# Patient Record
Sex: Female | Born: 1955
Health system: Southern US, Community
[De-identification: ages and names within clinical notes are randomized; demographics above are authoritative.]

## PROBLEM LIST (undated history)

## (undated) DIAGNOSIS — F419 Anxiety disorder, unspecified: Secondary | ICD-10-CM

## (undated) DIAGNOSIS — E785 Hyperlipidemia, unspecified: Secondary | ICD-10-CM

## (undated) DIAGNOSIS — K219 Gastro-esophageal reflux disease without esophagitis: Secondary | ICD-10-CM

## (undated) DIAGNOSIS — F329 Major depressive disorder, single episode, unspecified: Secondary | ICD-10-CM

## (undated) DIAGNOSIS — J45909 Unspecified asthma, uncomplicated: Secondary | ICD-10-CM

## (undated) DIAGNOSIS — K589 Irritable bowel syndrome without diarrhea: Secondary | ICD-10-CM

## (undated) DIAGNOSIS — C801 Malignant (primary) neoplasm, unspecified: Secondary | ICD-10-CM

## (undated) DIAGNOSIS — F32A Depression, unspecified: Secondary | ICD-10-CM

## (undated) DIAGNOSIS — N393 Stress incontinence (female) (male): Secondary | ICD-10-CM

## (undated) DIAGNOSIS — D692 Other nonthrombocytopenic purpura: Secondary | ICD-10-CM

## (undated) DIAGNOSIS — Z780 Asymptomatic menopausal state: Secondary | ICD-10-CM

## (undated) DIAGNOSIS — M797 Fibromyalgia: Secondary | ICD-10-CM

## (undated) DIAGNOSIS — M069 Rheumatoid arthritis, unspecified: Secondary | ICD-10-CM

## (undated) DIAGNOSIS — E118 Type 2 diabetes mellitus with unspecified complications: Secondary | ICD-10-CM

## (undated) DIAGNOSIS — I10 Essential (primary) hypertension: Secondary | ICD-10-CM

## (undated) DIAGNOSIS — R011 Cardiac murmur, unspecified: Secondary | ICD-10-CM

## (undated) DIAGNOSIS — R51 Headache: Secondary | ICD-10-CM

## (undated) DIAGNOSIS — M1990 Unspecified osteoarthritis, unspecified site: Secondary | ICD-10-CM

## (undated) DIAGNOSIS — G709 Myoneural disorder, unspecified: Secondary | ICD-10-CM

## (undated) DIAGNOSIS — E119 Type 2 diabetes mellitus without complications: Secondary | ICD-10-CM

## (undated) DIAGNOSIS — S82209A Unspecified fracture of shaft of unspecified tibia, initial encounter for closed fracture: Secondary | ICD-10-CM

## (undated) HISTORY — DX: Fibromyalgia: M79.7

## (undated) HISTORY — DX: Irritable bowel syndrome, unspecified: K58.9

## (undated) HISTORY — DX: Myoneural disorder, unspecified: G70.9

## (undated) HISTORY — DX: Unspecified osteoarthritis, unspecified site: M19.90

## (undated) HISTORY — DX: Gastro-esophageal reflux disease without esophagitis: K21.9

## (undated) HISTORY — DX: Headache: R51

## (undated) HISTORY — DX: Depression, unspecified: F32.A

## (undated) HISTORY — DX: Asymptomatic menopausal state: Z78.0

## (undated) HISTORY — DX: Other nonthrombocytopenic purpura: D69.2

## (undated) HISTORY — DX: Rheumatoid arthritis, unspecified: M06.9

## (undated) HISTORY — PX: NASAL SINUS SURGERY: SHX719

## (undated) HISTORY — DX: Anxiety disorder, unspecified: F41.9

## (undated) HISTORY — DX: Type 2 diabetes mellitus with unspecified complications: E11.8

## (undated) HISTORY — PX: CERVICAL LAMINECTOMY: SHX94

## (undated) HISTORY — DX: Major depressive disorder, single episode, unspecified: F32.9

---
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
## 1979-07-13 HISTORY — PX: OTHER SURGICAL HISTORY: SHX169

## 1979-12-11 HISTORY — PX: OTHER SURGICAL HISTORY: SHX169

## 1984-07-12 HISTORY — PX: CHOLECYSTECTOMY: SHX55

## 1987-10-11 HISTORY — PX: DILATION AND CURETTAGE OF UTERUS: SHX78

## 1988-12-10 HISTORY — PX: CERVICAL CONIZATION W/BX: SHX1330

## 1991-07-13 HISTORY — PX: TUBAL LIGATION: SHX77

## 1997-07-12 HISTORY — PX: HYSTEROSCOPY: SHX211

## 1997-10-04 ENCOUNTER — Ambulatory Visit (HOSPITAL_COMMUNITY): Admission: RE | Admit: 1997-10-04 | Discharge: 1997-10-04 | Payer: Self-pay | Admitting: Obstetrics and Gynecology

## 1997-11-06 ENCOUNTER — Ambulatory Visit (HOSPITAL_COMMUNITY): Admission: RE | Admit: 1997-11-06 | Discharge: 1997-11-06 | Payer: Self-pay | Admitting: Pulmonary Disease

## 1998-02-13 ENCOUNTER — Ambulatory Visit: Admission: RE | Admit: 1998-02-13 | Discharge: 1998-02-13 | Payer: Self-pay | Admitting: Obstetrics and Gynecology

## 1998-05-12 ENCOUNTER — Encounter: Payer: Self-pay | Admitting: Neurosurgery

## 1998-05-12 ENCOUNTER — Ambulatory Visit (HOSPITAL_COMMUNITY): Admission: RE | Admit: 1998-05-12 | Discharge: 1998-05-12 | Payer: Self-pay | Admitting: Neurosurgery

## 1998-12-02 DIAGNOSIS — F419 Anxiety disorder, unspecified: Secondary | ICD-10-CM | POA: Insufficient documentation

## 1999-04-02 ENCOUNTER — Ambulatory Visit (HOSPITAL_COMMUNITY): Admission: RE | Admit: 1999-04-02 | Discharge: 1999-04-02 | Payer: Self-pay | Admitting: Internal Medicine

## 1999-04-02 ENCOUNTER — Encounter: Payer: Self-pay | Admitting: Internal Medicine

## 1999-05-12 ENCOUNTER — Other Ambulatory Visit: Admission: RE | Admit: 1999-05-12 | Discharge: 1999-05-12 | Payer: Self-pay | Admitting: Obstetrics and Gynecology

## 1999-08-14 ENCOUNTER — Encounter: Payer: Self-pay | Admitting: Neurosurgery

## 1999-08-14 ENCOUNTER — Ambulatory Visit (HOSPITAL_COMMUNITY): Admission: RE | Admit: 1999-08-14 | Discharge: 1999-08-14 | Payer: Self-pay | Admitting: Neurosurgery

## 1999-09-18 ENCOUNTER — Ambulatory Visit (HOSPITAL_COMMUNITY): Admission: RE | Admit: 1999-09-18 | Discharge: 1999-09-18 | Payer: Self-pay | Admitting: Neurosurgery

## 1999-09-18 ENCOUNTER — Encounter: Payer: Self-pay | Admitting: Neurosurgery

## 1999-12-11 HISTORY — PX: ABDOMINAL HYSTERECTOMY: SHX81

## 1999-12-21 ENCOUNTER — Encounter (INDEPENDENT_AMBULATORY_CARE_PROVIDER_SITE_OTHER): Payer: Self-pay

## 1999-12-22 ENCOUNTER — Inpatient Hospital Stay (HOSPITAL_COMMUNITY): Admission: AD | Admit: 1999-12-22 | Discharge: 1999-12-23 | Payer: Self-pay | Admitting: Obstetrics and Gynecology

## 2000-02-24 ENCOUNTER — Ambulatory Visit (HOSPITAL_COMMUNITY): Admission: RE | Admit: 2000-02-24 | Discharge: 2000-02-24 | Payer: Self-pay | Admitting: Obstetrics and Gynecology

## 2000-02-24 ENCOUNTER — Encounter: Payer: Self-pay | Admitting: Obstetrics and Gynecology

## 2000-04-20 ENCOUNTER — Other Ambulatory Visit: Admission: RE | Admit: 2000-04-20 | Discharge: 2000-04-20 | Payer: Self-pay | Admitting: Obstetrics and Gynecology

## 2000-04-21 ENCOUNTER — Encounter (INDEPENDENT_AMBULATORY_CARE_PROVIDER_SITE_OTHER): Payer: Self-pay

## 2000-04-21 ENCOUNTER — Other Ambulatory Visit: Admission: RE | Admit: 2000-04-21 | Discharge: 2000-04-21 | Payer: Self-pay | Admitting: Obstetrics and Gynecology

## 2000-05-19 ENCOUNTER — Encounter: Payer: Self-pay | Admitting: Internal Medicine

## 2000-05-19 ENCOUNTER — Ambulatory Visit (HOSPITAL_COMMUNITY): Admission: RE | Admit: 2000-05-19 | Discharge: 2000-05-19 | Payer: Self-pay | Admitting: Internal Medicine

## 2000-11-07 ENCOUNTER — Encounter: Admission: RE | Admit: 2000-11-07 | Discharge: 2000-11-07 | Payer: Self-pay | Admitting: Neurosurgery

## 2000-11-07 ENCOUNTER — Encounter: Payer: Self-pay | Admitting: Neurosurgery

## 2001-01-03 ENCOUNTER — Ambulatory Visit (HOSPITAL_COMMUNITY): Admission: RE | Admit: 2001-01-03 | Discharge: 2001-01-03 | Payer: Self-pay | Admitting: Infectious Diseases

## 2001-01-03 ENCOUNTER — Encounter: Payer: Self-pay | Admitting: Infectious Diseases

## 2001-03-01 ENCOUNTER — Ambulatory Visit (HOSPITAL_COMMUNITY): Admission: RE | Admit: 2001-03-01 | Discharge: 2001-03-01 | Payer: Self-pay | Admitting: Obstetrics and Gynecology

## 2001-03-01 ENCOUNTER — Encounter: Payer: Self-pay | Admitting: Obstetrics and Gynecology

## 2001-05-17 ENCOUNTER — Other Ambulatory Visit: Admission: RE | Admit: 2001-05-17 | Discharge: 2001-05-17 | Payer: Self-pay | Admitting: Obstetrics and Gynecology

## 2001-06-23 ENCOUNTER — Ambulatory Visit (HOSPITAL_COMMUNITY): Admission: RE | Admit: 2001-06-23 | Discharge: 2001-06-23 | Payer: Self-pay

## 2001-06-23 ENCOUNTER — Encounter: Payer: Self-pay | Admitting: Family Medicine

## 2001-10-30 ENCOUNTER — Encounter: Payer: Self-pay | Admitting: Obstetrics and Gynecology

## 2001-10-30 ENCOUNTER — Encounter: Admission: RE | Admit: 2001-10-30 | Discharge: 2001-10-30 | Payer: Self-pay | Admitting: Obstetrics and Gynecology

## 2001-11-29 ENCOUNTER — Encounter: Admission: RE | Admit: 2001-11-29 | Discharge: 2001-11-29 | Payer: Self-pay | Admitting: Family Medicine

## 2001-11-29 ENCOUNTER — Encounter: Payer: Self-pay | Admitting: Emergency Medicine

## 2001-11-29 ENCOUNTER — Encounter: Payer: Self-pay | Admitting: Family Medicine

## 2001-11-29 ENCOUNTER — Emergency Department (HOSPITAL_COMMUNITY): Admission: EM | Admit: 2001-11-29 | Discharge: 2001-11-29 | Payer: Self-pay | Admitting: Emergency Medicine

## 2001-12-01 ENCOUNTER — Ambulatory Visit (HOSPITAL_COMMUNITY): Admission: RE | Admit: 2001-12-01 | Discharge: 2001-12-01 | Payer: Self-pay | Admitting: Family Medicine

## 2001-12-01 ENCOUNTER — Encounter: Payer: Self-pay | Admitting: Family Medicine

## 2002-01-25 ENCOUNTER — Encounter: Payer: Self-pay | Admitting: Family Medicine

## 2002-01-25 ENCOUNTER — Ambulatory Visit (HOSPITAL_COMMUNITY): Admission: RE | Admit: 2002-01-25 | Discharge: 2002-01-25 | Payer: Self-pay | Admitting: Family Medicine

## 2002-03-27 ENCOUNTER — Encounter: Payer: Self-pay | Admitting: Obstetrics and Gynecology

## 2002-03-27 ENCOUNTER — Ambulatory Visit (HOSPITAL_COMMUNITY): Admission: RE | Admit: 2002-03-27 | Discharge: 2002-03-27 | Payer: Self-pay | Admitting: Obstetrics and Gynecology

## 2002-08-08 ENCOUNTER — Encounter: Payer: Self-pay | Admitting: Neurosurgery

## 2002-08-08 ENCOUNTER — Encounter: Admission: RE | Admit: 2002-08-08 | Discharge: 2002-08-08 | Payer: Self-pay | Admitting: Neurosurgery

## 2002-09-19 ENCOUNTER — Ambulatory Visit (HOSPITAL_COMMUNITY): Admission: RE | Admit: 2002-09-19 | Discharge: 2002-09-19 | Payer: Self-pay | Admitting: Family Medicine

## 2002-09-19 ENCOUNTER — Encounter: Payer: Self-pay | Admitting: Family Medicine

## 2003-02-20 ENCOUNTER — Encounter: Admission: RE | Admit: 2003-02-20 | Discharge: 2003-03-20 | Payer: Self-pay | Admitting: Orthopedic Surgery

## 2003-04-18 ENCOUNTER — Ambulatory Visit (HOSPITAL_COMMUNITY): Admission: RE | Admit: 2003-04-18 | Discharge: 2003-04-18 | Payer: Self-pay | Admitting: Obstetrics and Gynecology

## 2003-04-18 ENCOUNTER — Encounter: Payer: Self-pay | Admitting: Obstetrics and Gynecology

## 2003-06-24 ENCOUNTER — Other Ambulatory Visit: Admission: RE | Admit: 2003-06-24 | Discharge: 2003-06-24 | Payer: Self-pay | Admitting: Family Medicine

## 2003-07-13 HISTORY — PX: FOOT SURGERY: SHX648

## 2003-10-22 ENCOUNTER — Ambulatory Visit (HOSPITAL_BASED_OUTPATIENT_CLINIC_OR_DEPARTMENT_OTHER): Admission: RE | Admit: 2003-10-22 | Discharge: 2003-10-22 | Payer: Self-pay | Admitting: Orthopedic Surgery

## 2003-10-22 ENCOUNTER — Ambulatory Visit (HOSPITAL_COMMUNITY): Admission: RE | Admit: 2003-10-22 | Discharge: 2003-10-22 | Payer: Self-pay | Admitting: Orthopedic Surgery

## 2004-01-31 ENCOUNTER — Ambulatory Visit (HOSPITAL_COMMUNITY): Admission: RE | Admit: 2004-01-31 | Discharge: 2004-01-31 | Payer: Self-pay | Admitting: Family Medicine

## 2004-03-27 ENCOUNTER — Encounter: Admission: RE | Admit: 2004-03-27 | Discharge: 2004-03-27 | Payer: Self-pay | Admitting: Orthopaedic Surgery

## 2004-04-21 ENCOUNTER — Ambulatory Visit (HOSPITAL_COMMUNITY): Admission: RE | Admit: 2004-04-21 | Discharge: 2004-04-21 | Payer: Self-pay | Admitting: Obstetrics and Gynecology

## 2004-04-23 ENCOUNTER — Encounter: Admission: RE | Admit: 2004-04-23 | Discharge: 2004-04-23 | Payer: Self-pay | Admitting: Orthopaedic Surgery

## 2004-06-02 ENCOUNTER — Ambulatory Visit (HOSPITAL_COMMUNITY): Admission: RE | Admit: 2004-06-02 | Discharge: 2004-06-02 | Payer: Self-pay | Admitting: Orthopaedic Surgery

## 2004-06-17 ENCOUNTER — Ambulatory Visit: Payer: Self-pay | Admitting: Family Medicine

## 2004-07-01 ENCOUNTER — Inpatient Hospital Stay (HOSPITAL_COMMUNITY): Admission: RE | Admit: 2004-07-01 | Discharge: 2004-07-02 | Payer: Self-pay | Admitting: Orthopaedic Surgery

## 2004-09-13 ENCOUNTER — Emergency Department (HOSPITAL_COMMUNITY): Admission: EM | Admit: 2004-09-13 | Discharge: 2004-09-13 | Payer: Self-pay | Admitting: *Deleted

## 2004-09-15 ENCOUNTER — Ambulatory Visit: Payer: Self-pay | Admitting: Pulmonary Disease

## 2004-09-22 ENCOUNTER — Ambulatory Visit: Payer: Self-pay | Admitting: Pulmonary Disease

## 2004-10-22 ENCOUNTER — Ambulatory Visit: Payer: Self-pay | Admitting: Family Medicine

## 2004-11-20 ENCOUNTER — Ambulatory Visit: Payer: Self-pay | Admitting: Internal Medicine

## 2004-12-08 ENCOUNTER — Ambulatory Visit: Payer: Self-pay | Admitting: Family Medicine

## 2005-01-18 ENCOUNTER — Ambulatory Visit: Payer: Self-pay | Admitting: Internal Medicine

## 2005-02-10 ENCOUNTER — Ambulatory Visit: Payer: Self-pay | Admitting: Internal Medicine

## 2005-02-12 ENCOUNTER — Ambulatory Visit: Payer: Self-pay | Admitting: Internal Medicine

## 2005-05-04 ENCOUNTER — Ambulatory Visit (HOSPITAL_COMMUNITY): Admission: RE | Admit: 2005-05-04 | Discharge: 2005-05-04 | Payer: Self-pay | Admitting: Obstetrics and Gynecology

## 2005-05-05 ENCOUNTER — Other Ambulatory Visit: Admission: RE | Admit: 2005-05-05 | Discharge: 2005-05-05 | Payer: Self-pay | Admitting: Obstetrics and Gynecology

## 2005-05-10 ENCOUNTER — Ambulatory Visit: Payer: Self-pay | Admitting: Pulmonary Disease

## 2005-06-10 ENCOUNTER — Ambulatory Visit: Payer: Self-pay | Admitting: Family Medicine

## 2005-06-14 ENCOUNTER — Ambulatory Visit: Payer: Self-pay | Admitting: Cardiology

## 2005-06-14 ENCOUNTER — Ambulatory Visit: Payer: Self-pay | Admitting: Cardiovascular Disease

## 2005-06-24 ENCOUNTER — Ambulatory Visit: Payer: Self-pay

## 2005-09-11 ENCOUNTER — Ambulatory Visit: Payer: Self-pay | Admitting: Family Medicine

## 2005-09-16 ENCOUNTER — Ambulatory Visit: Payer: Self-pay | Admitting: Family Medicine

## 2005-10-18 ENCOUNTER — Ambulatory Visit: Payer: Self-pay | Admitting: Family Medicine

## 2005-10-29 ENCOUNTER — Ambulatory Visit: Payer: Self-pay | Admitting: Family Medicine

## 2005-11-05 ENCOUNTER — Ambulatory Visit: Payer: Self-pay | Admitting: Family Medicine

## 2005-11-15 ENCOUNTER — Encounter: Admission: RE | Admit: 2005-11-15 | Discharge: 2005-12-28 | Payer: Self-pay | Admitting: Family Medicine

## 2006-02-08 ENCOUNTER — Ambulatory Visit: Payer: Self-pay | Admitting: Family Medicine

## 2006-02-15 ENCOUNTER — Ambulatory Visit: Payer: Self-pay | Admitting: Internal Medicine

## 2006-02-16 ENCOUNTER — Ambulatory Visit: Payer: Self-pay

## 2006-05-05 ENCOUNTER — Ambulatory Visit (HOSPITAL_COMMUNITY): Admission: RE | Admit: 2006-05-05 | Discharge: 2006-05-05 | Payer: Self-pay | Admitting: Obstetrics and Gynecology

## 2006-05-09 ENCOUNTER — Other Ambulatory Visit: Admission: RE | Admit: 2006-05-09 | Discharge: 2006-05-09 | Payer: Self-pay | Admitting: Obstetrics and Gynecology

## 2006-05-11 ENCOUNTER — Ambulatory Visit: Payer: Self-pay | Admitting: Family Medicine

## 2006-05-11 LAB — CONVERTED CEMR LAB
ALT: 24 units/L (ref 0–40)
AST: 19 units/L (ref 0–37)
Albumin: 3.5 g/dL (ref 3.5–5.2)
Alkaline Phosphatase: 72 units/L (ref 39–117)
BUN: 7 mg/dL (ref 6–23)
CO2: 31 meq/L (ref 19–32)
Calcium: 9.5 mg/dL (ref 8.4–10.5)
Chloride: 102 meq/L (ref 96–112)
Chol/HDL Ratio, serum: 3.6
Cholesterol: 146 mg/dL (ref 0–200)
Creatinine, Ser: 0.8 mg/dL (ref 0.4–1.2)
GFR calc non Af Amer: 81 mL/min
Glomerular Filtration Rate, Af Am: 98 mL/min/{1.73_m2}
Glucose, Bld: 96 mg/dL (ref 70–99)
HDL: 40.9 mg/dL (ref >39.0)
Hgb A1c MFr Bld: 6.2 % — ABNORMAL HIGH (ref 4.6–6.0)
LDL Cholesterol: 84 mg/dL (ref 0–99)
Potassium: 4.2 meq/L (ref 3.5–5.1)
Sodium: 139 meq/L (ref 135–145)
Total Bilirubin: 0.6 mg/dL (ref 0.3–1.2)
Total Protein: 7.2 g/dL (ref 6.0–8.3)
Triglyceride fasting, serum: 107 mg/dL (ref 0–149)
VLDL: 21 mg/dL (ref 0–40)

## 2006-06-06 ENCOUNTER — Ambulatory Visit: Payer: Self-pay | Admitting: Family Medicine

## 2006-06-14 ENCOUNTER — Ambulatory Visit: Payer: Self-pay | Admitting: Family Medicine

## 2006-06-14 ENCOUNTER — Ambulatory Visit: Payer: Self-pay | Admitting: Cardiology

## 2006-07-26 ENCOUNTER — Ambulatory Visit: Payer: Self-pay | Admitting: Family Medicine

## 2006-07-26 LAB — CONVERTED CEMR LAB
ALT: 21 units/L (ref 0–40)
AST: 20 units/L (ref 0–37)
Albumin: 3.7 g/dL (ref 3.5–5.2)
Alkaline Phosphatase: 64 units/L (ref 39–117)
BUN: 8 mg/dL (ref 6–23)
Basophils Absolute: 0.1 10*3/uL (ref 0.0–0.1)
Basophils Relative: 0.7 % (ref 0.0–1.0)
CO2: 31 meq/L (ref 19–32)
Calcium: 9.5 mg/dL (ref 8.4–10.5)
Chloride: 98 meq/L (ref 96–112)
Cholesterol: 161 mg/dL (ref 0–200)
Creatinine, Ser: 0.8 mg/dL (ref 0.4–1.2)
Creatinine,U: 104.8 mg/dL
Eosinophils Relative: 2 % (ref 0.0–5.0)
GFR calc Af Amer: 98 mL/min
GFR calc non Af Amer: 81 mL/min
Glucose, Bld: 102 mg/dL — ABNORMAL HIGH (ref 70–99)
HCT: 40 % (ref 36.0–46.0)
HDL: 43.7 mg/dL (ref >39.0)
Hemoglobin: 13 g/dL (ref 12.0–15.0)
Hgb A1c MFr Bld: 6.2 % — ABNORMAL HIGH (ref 4.6–6.0)
LDL Cholesterol: 97 mg/dL (ref 0–99)
Lymphocytes Relative: 27.9 % (ref 12.0–46.0)
MCHC: 32.5 g/dL (ref 30.0–36.0)
MCV: 84.3 fL (ref 78.0–100.0)
Microalb Creat Ratio: 2.9 mg/g (ref 0.0–30.0)
Microalb, Ur: 0.3 mg/dL (ref 0.0–1.9)
Monocytes Absolute: 0.7 10*3/uL (ref 0.2–0.7)
Monocytes Relative: 7.2 % (ref 3.0–11.0)
Neutro Abs: 5.7 10*3/uL (ref 1.4–7.7)
Neutrophils Relative %: 62.2 % (ref 43.0–77.0)
Platelets: 311 10*3/uL (ref 150–400)
Potassium: 4 meq/L (ref 3.5–5.1)
RBC: 4.75 M/uL (ref 3.87–5.11)
RDW: 13.6 % (ref 11.5–14.6)
Sodium: 136 meq/L (ref 135–145)
TSH: 2.46 microintl units/mL (ref 0.35–5.50)
Total Bilirubin: 1 mg/dL (ref 0.3–1.2)
Total CHOL/HDL Ratio: 3.7
Total Protein: 7.4 g/dL (ref 6.0–8.3)
Triglycerides: 101 mg/dL (ref 0–149)
VLDL: 20 mg/dL (ref 0–40)
WBC: 9.3 10*3/uL (ref 4.5–10.5)

## 2006-08-11 ENCOUNTER — Ambulatory Visit: Payer: Self-pay | Admitting: Family Medicine

## 2006-08-30 ENCOUNTER — Ambulatory Visit: Payer: Self-pay | Admitting: Family Medicine

## 2006-08-30 ENCOUNTER — Encounter: Admission: RE | Admit: 2006-08-30 | Discharge: 2006-08-30 | Payer: Self-pay | Admitting: Family Medicine

## 2006-08-30 LAB — CONVERTED CEMR LAB
ALT: 22 units/L (ref 0–40)
AST: 19 units/L (ref 0–37)
Albumin: 3.6 g/dL (ref 3.5–5.2)
Alkaline Phosphatase: 73 units/L (ref 39–117)
Amylase: 41 units/L (ref 27–131)
BUN: 5 mg/dL — ABNORMAL LOW (ref 6–23)
Bilirubin, Direct: 0.1 mg/dL (ref 0.0–0.3)
CO2: 27 meq/L (ref 19–32)
Calcium: 9.5 mg/dL (ref 8.4–10.5)
Chloride: 102 meq/L (ref 96–112)
Creatinine, Ser: 0.9 mg/dL (ref 0.4–1.2)
GFR calc Af Amer: 86 mL/min
GFR calc non Af Amer: 71 mL/min
Glucose, Bld: 129 mg/dL — ABNORMAL HIGH (ref 70–99)
Lipase: 20 units/L (ref 11.0–59.0)
Potassium: 3.9 meq/L (ref 3.5–5.1)
Sodium: 138 meq/L (ref 135–145)
Total Bilirubin: 0.7 mg/dL (ref 0.3–1.2)
Total Protein: 7.3 g/dL (ref 6.0–8.3)

## 2006-08-31 ENCOUNTER — Ambulatory Visit: Payer: Self-pay | Admitting: Cardiovascular Disease

## 2006-08-31 ENCOUNTER — Ambulatory Visit: Payer: Self-pay | Admitting: Family Medicine

## 2006-08-31 LAB — CONVERTED CEMR LAB
Basophils Absolute: 0 10*3/uL (ref 0.0–0.1)
Basophils Relative: 0.5 % (ref 0.0–1.0)
Eosinophils Absolute: 0.1 10*3/uL (ref 0.0–0.6)
Eosinophils Relative: 1.3 % (ref 0.0–5.0)
HCT: 42.1 % (ref 36.0–46.0)
Hemoglobin: 14.2 g/dL (ref 12.0–15.0)
Lymphocytes Relative: 24.2 % (ref 12.0–46.0)
MCHC: 33.7 g/dL (ref 30.0–36.0)
MCV: 83.7 fL (ref 78.0–100.0)
Monocytes Absolute: 0.9 10*3/uL — ABNORMAL HIGH (ref 0.2–0.7)
Monocytes Relative: 11 % (ref 3.0–11.0)
Neutro Abs: 5.3 10*3/uL (ref 1.4–7.7)
Neutrophils Relative %: 63 % (ref 43.0–77.0)
Platelets: 329 10*3/uL (ref 150–400)
RBC: 5.03 M/uL (ref 3.87–5.11)
RDW: 13.4 % (ref 11.5–14.6)
WBC: 8.3 10*3/uL (ref 4.5–10.5)

## 2006-09-01 ENCOUNTER — Encounter: Payer: Self-pay | Admitting: Family Medicine

## 2006-09-07 ENCOUNTER — Encounter: Admission: RE | Admit: 2006-09-07 | Discharge: 2006-12-06 | Payer: Self-pay | Admitting: Family Medicine

## 2006-09-08 ENCOUNTER — Ambulatory Visit: Payer: Self-pay | Admitting: Family Medicine

## 2006-10-07 ENCOUNTER — Ambulatory Visit: Payer: Self-pay | Admitting: Family Medicine

## 2006-10-12 ENCOUNTER — Ambulatory Visit: Payer: Self-pay | Admitting: Family Medicine

## 2006-10-12 LAB — CONVERTED CEMR LAB
ALT: 38 units/L (ref 0–40)
AST: 25 units/L (ref 0–37)
Albumin: 3.1 g/dL — ABNORMAL LOW (ref 3.5–5.2)
Alkaline Phosphatase: 77 units/L (ref 39–117)
BUN: 5 mg/dL — ABNORMAL LOW (ref 6–23)
CO2: 31 meq/L (ref 19–32)
Calcium: 9.1 mg/dL (ref 8.4–10.5)
Chloride: 107 meq/L (ref 96–112)
Cholesterol: 146 mg/dL (ref 0–200)
Creatinine, Ser: 0.7 mg/dL (ref 0.4–1.2)
GFR calc Af Amer: 114 mL/min
GFR calc non Af Amer: 95 mL/min
Glucose, Bld: 118 mg/dL — ABNORMAL HIGH (ref 70–99)
HDL: 38 mg/dL — ABNORMAL LOW (ref >39.0)
Hgb A1c MFr Bld: 6.7 % — ABNORMAL HIGH (ref 4.6–6.0)
LDL Cholesterol: 90 mg/dL (ref 0–99)
Potassium: 4.4 meq/L (ref 3.5–5.1)
Sodium: 142 meq/L (ref 135–145)
Total Bilirubin: 0.6 mg/dL (ref 0.3–1.2)
Total CHOL/HDL Ratio: 3.8
Total Protein: 6.7 g/dL (ref 6.0–8.3)
Triglycerides: 90 mg/dL (ref 0–149)
VLDL: 18 mg/dL (ref 0–40)

## 2006-10-19 ENCOUNTER — Encounter: Payer: Self-pay | Admitting: Family Medicine

## 2006-11-07 ENCOUNTER — Ambulatory Visit: Payer: Self-pay | Admitting: Family Medicine

## 2006-11-29 ENCOUNTER — Ambulatory Visit: Payer: Self-pay | Admitting: Internal Medicine

## 2006-12-09 ENCOUNTER — Ambulatory Visit: Payer: Self-pay | Admitting: Internal Medicine

## 2006-12-21 ENCOUNTER — Ambulatory Visit: Payer: Self-pay | Admitting: Family Medicine

## 2006-12-28 ENCOUNTER — Encounter (INDEPENDENT_AMBULATORY_CARE_PROVIDER_SITE_OTHER): Payer: Self-pay | Admitting: *Deleted

## 2006-12-30 ENCOUNTER — Encounter: Payer: Self-pay | Admitting: Family Medicine

## 2006-12-30 LAB — CONVERTED CEMR LAB
ALT: 32 units/L (ref 0–40)
AST: 18 units/L (ref 0–37)
Albumin: 3.6 g/dL (ref 3.5–5.2)
Alkaline Phosphatase: 84 units/L (ref 39–117)
BUN: 7 mg/dL (ref 6–23)
Bilirubin, Direct: 0.1 mg/dL (ref 0.0–0.3)
CO2: 31 meq/L (ref 19–32)
Calcium: 9.7 mg/dL (ref 8.4–10.5)
Chloride: 98 meq/L (ref 96–112)
Creatinine, Ser: 0.7 mg/dL (ref 0.4–1.2)
GFR calc Af Amer: 114 mL/min
GFR calc non Af Amer: 95 mL/min
Glucose, Bld: 108 mg/dL — ABNORMAL HIGH (ref 70–99)
Hgb A1c MFr Bld: 7.2 % — ABNORMAL HIGH (ref 4.6–6.0)
Potassium: 4.1 meq/L (ref 3.5–5.1)
Sodium: 137 meq/L (ref 135–145)
Total Bilirubin: 0.9 mg/dL (ref 0.3–1.2)
Total Protein: 7.3 g/dL (ref 6.0–8.3)

## 2007-01-04 ENCOUNTER — Ambulatory Visit: Payer: Self-pay | Admitting: Family Medicine

## 2007-01-04 DIAGNOSIS — E118 Type 2 diabetes mellitus with unspecified complications: Secondary | ICD-10-CM

## 2007-01-04 DIAGNOSIS — R519 Headache, unspecified: Secondary | ICD-10-CM | POA: Insufficient documentation

## 2007-01-04 DIAGNOSIS — E785 Hyperlipidemia, unspecified: Secondary | ICD-10-CM | POA: Insufficient documentation

## 2007-01-04 DIAGNOSIS — I10 Essential (primary) hypertension: Secondary | ICD-10-CM | POA: Insufficient documentation

## 2007-01-04 DIAGNOSIS — E1140 Type 2 diabetes mellitus with diabetic neuropathy, unspecified: Secondary | ICD-10-CM | POA: Insufficient documentation

## 2007-01-04 DIAGNOSIS — R51 Headache: Secondary | ICD-10-CM | POA: Insufficient documentation

## 2007-01-04 DIAGNOSIS — J309 Allergic rhinitis, unspecified: Secondary | ICD-10-CM | POA: Insufficient documentation

## 2007-01-04 DIAGNOSIS — R0989 Other specified symptoms and signs involving the circulatory and respiratory systems: Secondary | ICD-10-CM | POA: Insufficient documentation

## 2007-01-05 ENCOUNTER — Ambulatory Visit: Payer: Self-pay | Admitting: Internal Medicine

## 2007-01-05 ENCOUNTER — Encounter: Payer: Self-pay | Admitting: Family Medicine

## 2007-01-05 LAB — HM COLONOSCOPY

## 2007-03-07 ENCOUNTER — Ambulatory Visit: Payer: Self-pay | Admitting: Endocrinology

## 2007-03-08 ENCOUNTER — Encounter: Payer: Self-pay | Admitting: Endocrinology

## 2007-04-03 ENCOUNTER — Ambulatory Visit: Payer: Self-pay | Admitting: Family Medicine

## 2007-04-07 ENCOUNTER — Encounter: Payer: Self-pay | Admitting: *Deleted

## 2007-04-07 LAB — CONVERTED CEMR LAB
BUN: 6 mg/dL (ref 6–23)
Basophils Absolute: 0.1 10*3/uL (ref 0.0–0.1)
Basophils Relative: 0.7 % (ref 0.0–1.0)
CO2: 31 meq/L (ref 19–32)
Calcium: 9.1 mg/dL (ref 8.4–10.5)
Chloride: 109 meq/L (ref 96–112)
Creatinine, Ser: 0.8 mg/dL (ref 0.4–1.2)
Eosinophils Absolute: 0.2 10*3/uL (ref 0.0–0.6)
Eosinophils Relative: 2.3 % (ref 0.0–5.0)
GFR calc Af Amer: 98 mL/min
GFR calc non Af Amer: 81 mL/min
Glucose, Bld: 98 mg/dL (ref 70–99)
HCT: 33.5 % — ABNORMAL LOW (ref 36.0–46.0)
Hemoglobin: 11.3 g/dL — ABNORMAL LOW (ref 12.0–15.0)
Hgb A1c MFr Bld: 6.7 % — ABNORMAL HIGH (ref 4.6–6.0)
Lymphocytes Relative: 25.9 % (ref 12.0–46.0)
MCHC: 33.9 g/dL (ref 30.0–36.0)
MCV: 83.7 fL (ref 78.0–100.0)
Monocytes Absolute: 0.6 10*3/uL (ref 0.2–0.7)
Monocytes Relative: 6.1 % (ref 3.0–11.0)
Neutro Abs: 6.9 10*3/uL (ref 1.4–7.7)
Neutrophils Relative %: 65 % (ref 43.0–77.0)
Platelets: 301 10*3/uL (ref 150–400)
Potassium: 4.2 meq/L (ref 3.5–5.1)
RBC: 4 M/uL (ref 3.87–5.11)
RDW: 13.6 % (ref 11.5–14.6)
Sodium: 145 meq/L (ref 135–145)
WBC: 10.5 10*3/uL (ref 4.5–10.5)

## 2007-04-21 ENCOUNTER — Encounter: Admission: RE | Admit: 2007-04-21 | Discharge: 2007-04-21 | Payer: Self-pay | Admitting: Orthopaedic Surgery

## 2007-05-08 ENCOUNTER — Ambulatory Visit (HOSPITAL_COMMUNITY): Admission: RE | Admit: 2007-05-08 | Discharge: 2007-05-08 | Payer: Self-pay | Admitting: Obstetrics and Gynecology

## 2007-05-23 ENCOUNTER — Telehealth: Payer: Self-pay | Admitting: Family Medicine

## 2007-05-23 ENCOUNTER — Encounter: Admission: RE | Admit: 2007-05-23 | Discharge: 2007-05-23 | Payer: Self-pay | Admitting: Orthopaedic Surgery

## 2007-05-24 ENCOUNTER — Ambulatory Visit: Payer: Self-pay | Admitting: Endocrinology

## 2007-05-24 LAB — CONVERTED CEMR LAB: Hgb A1c MFr Bld: 6.3 % — ABNORMAL HIGH (ref 4.6–6.0)

## 2007-05-29 ENCOUNTER — Ambulatory Visit: Payer: Self-pay | Admitting: Endocrinology

## 2007-06-02 ENCOUNTER — Encounter: Payer: Self-pay | Admitting: Endocrinology

## 2007-06-09 ENCOUNTER — Ambulatory Visit: Payer: Self-pay | Admitting: Family Medicine

## 2007-06-09 DIAGNOSIS — F418 Other specified anxiety disorders: Secondary | ICD-10-CM | POA: Insufficient documentation

## 2007-06-09 DIAGNOSIS — F411 Generalized anxiety disorder: Secondary | ICD-10-CM

## 2007-06-09 DIAGNOSIS — F329 Major depressive disorder, single episode, unspecified: Secondary | ICD-10-CM | POA: Insufficient documentation

## 2007-06-09 DIAGNOSIS — F3289 Other specified depressive episodes: Secondary | ICD-10-CM | POA: Insufficient documentation

## 2007-06-21 ENCOUNTER — Telehealth (INDEPENDENT_AMBULATORY_CARE_PROVIDER_SITE_OTHER): Payer: Self-pay | Admitting: *Deleted

## 2007-06-22 ENCOUNTER — Telehealth: Payer: Self-pay | Admitting: Family Medicine

## 2007-06-23 ENCOUNTER — Telehealth: Payer: Self-pay | Admitting: Internal Medicine

## 2007-07-13 DIAGNOSIS — G709 Myoneural disorder, unspecified: Secondary | ICD-10-CM

## 2007-07-13 HISTORY — DX: Myoneural disorder, unspecified: G70.9

## 2007-07-17 ENCOUNTER — Telehealth (INDEPENDENT_AMBULATORY_CARE_PROVIDER_SITE_OTHER): Payer: Self-pay | Admitting: *Deleted

## 2007-08-12 ENCOUNTER — Ambulatory Visit: Payer: Self-pay | Admitting: Family Medicine

## 2007-08-12 DIAGNOSIS — L0291 Cutaneous abscess, unspecified: Secondary | ICD-10-CM | POA: Insufficient documentation

## 2007-08-12 DIAGNOSIS — L0390 Cellulitis, unspecified: Secondary | ICD-10-CM

## 2007-08-14 ENCOUNTER — Ambulatory Visit: Payer: Self-pay | Admitting: Endocrinology

## 2007-08-14 LAB — CONVERTED CEMR LAB: Hgb A1c MFr Bld: 6.6 % — ABNORMAL HIGH (ref 4.6–6.0)

## 2007-08-17 ENCOUNTER — Ambulatory Visit: Payer: Self-pay | Admitting: Endocrinology

## 2007-09-01 ENCOUNTER — Ambulatory Visit: Payer: Self-pay | Admitting: Family Medicine

## 2007-09-01 DIAGNOSIS — J019 Acute sinusitis, unspecified: Secondary | ICD-10-CM | POA: Insufficient documentation

## 2007-09-20 ENCOUNTER — Telehealth (INDEPENDENT_AMBULATORY_CARE_PROVIDER_SITE_OTHER): Payer: Self-pay | Admitting: *Deleted

## 2007-09-20 ENCOUNTER — Telehealth: Payer: Self-pay | Admitting: Family Medicine

## 2007-09-22 ENCOUNTER — Encounter: Payer: Self-pay | Admitting: Family Medicine

## 2007-09-25 ENCOUNTER — Ambulatory Visit: Payer: Self-pay | Admitting: Family Medicine

## 2007-09-26 ENCOUNTER — Ambulatory Visit: Payer: Self-pay | Admitting: Family Medicine

## 2007-10-04 ENCOUNTER — Telehealth (INDEPENDENT_AMBULATORY_CARE_PROVIDER_SITE_OTHER): Payer: Self-pay | Admitting: *Deleted

## 2007-11-14 ENCOUNTER — Encounter: Payer: Self-pay | Admitting: Family Medicine

## 2007-11-30 ENCOUNTER — Ambulatory Visit: Payer: Self-pay | Admitting: Endocrinology

## 2007-11-30 LAB — CONVERTED CEMR LAB: Hgb A1c MFr Bld: 7 % — ABNORMAL HIGH (ref 4.6–6.0)

## 2007-12-07 ENCOUNTER — Telehealth (INDEPENDENT_AMBULATORY_CARE_PROVIDER_SITE_OTHER): Payer: Self-pay | Admitting: *Deleted

## 2007-12-11 ENCOUNTER — Telehealth (INDEPENDENT_AMBULATORY_CARE_PROVIDER_SITE_OTHER): Payer: Self-pay | Admitting: *Deleted

## 2008-01-17 ENCOUNTER — Telehealth: Payer: Self-pay | Admitting: Family Medicine

## 2008-01-17 ENCOUNTER — Ambulatory Visit: Payer: Self-pay | Admitting: Family Medicine

## 2008-01-18 ENCOUNTER — Encounter (INDEPENDENT_AMBULATORY_CARE_PROVIDER_SITE_OTHER): Payer: Self-pay | Admitting: *Deleted

## 2008-01-18 LAB — CONVERTED CEMR LAB: Hgb A1c MFr Bld: 7.2 % — ABNORMAL HIGH (ref 4.6–6.0)

## 2008-01-29 ENCOUNTER — Ambulatory Visit: Payer: Self-pay | Admitting: Family Medicine

## 2008-02-14 ENCOUNTER — Telehealth (INDEPENDENT_AMBULATORY_CARE_PROVIDER_SITE_OTHER): Payer: Self-pay | Admitting: *Deleted

## 2008-02-14 DIAGNOSIS — M545 Low back pain, unspecified: Secondary | ICD-10-CM | POA: Insufficient documentation

## 2008-02-15 ENCOUNTER — Ambulatory Visit: Payer: Self-pay | Admitting: Family Medicine

## 2008-02-15 ENCOUNTER — Ambulatory Visit (HOSPITAL_COMMUNITY): Admission: RE | Admit: 2008-02-15 | Discharge: 2008-02-15 | Payer: Self-pay | Admitting: Family Medicine

## 2008-02-16 ENCOUNTER — Telehealth (INDEPENDENT_AMBULATORY_CARE_PROVIDER_SITE_OTHER): Payer: Self-pay | Admitting: *Deleted

## 2008-02-26 ENCOUNTER — Telehealth (INDEPENDENT_AMBULATORY_CARE_PROVIDER_SITE_OTHER): Payer: Self-pay | Admitting: *Deleted

## 2008-03-01 ENCOUNTER — Encounter: Payer: Self-pay | Admitting: Family Medicine

## 2008-03-06 ENCOUNTER — Ambulatory Visit (HOSPITAL_COMMUNITY): Admission: RE | Admit: 2008-03-06 | Discharge: 2008-03-06 | Payer: Self-pay | Admitting: Neurosurgery

## 2008-03-07 ENCOUNTER — Encounter: Payer: Self-pay | Admitting: Family Medicine

## 2008-03-13 ENCOUNTER — Encounter: Payer: Self-pay | Admitting: Family Medicine

## 2008-03-19 ENCOUNTER — Ambulatory Visit (HOSPITAL_COMMUNITY): Admission: RE | Admit: 2008-03-19 | Discharge: 2008-03-20 | Payer: Self-pay | Admitting: Neurosurgery

## 2008-03-28 ENCOUNTER — Telehealth: Payer: Self-pay | Admitting: Family Medicine

## 2008-04-10 ENCOUNTER — Encounter: Payer: Self-pay | Admitting: Family Medicine

## 2008-04-24 ENCOUNTER — Encounter: Payer: Self-pay | Admitting: Family Medicine

## 2008-04-25 ENCOUNTER — Ambulatory Visit (HOSPITAL_COMMUNITY): Admission: RE | Admit: 2008-04-25 | Discharge: 2008-04-25 | Payer: Self-pay | Admitting: Neurosurgery

## 2008-04-29 ENCOUNTER — Encounter: Payer: Self-pay | Admitting: Family Medicine

## 2008-05-08 ENCOUNTER — Ambulatory Visit (HOSPITAL_COMMUNITY): Admission: RE | Admit: 2008-05-08 | Discharge: 2008-05-08 | Payer: Self-pay | Admitting: Obstetrics and Gynecology

## 2008-05-17 ENCOUNTER — Telehealth (INDEPENDENT_AMBULATORY_CARE_PROVIDER_SITE_OTHER): Payer: Self-pay | Admitting: *Deleted

## 2008-06-03 ENCOUNTER — Encounter: Payer: Self-pay | Admitting: Family Medicine

## 2008-06-13 ENCOUNTER — Ambulatory Visit: Payer: Self-pay | Admitting: Family Medicine

## 2008-06-13 DIAGNOSIS — M2550 Pain in unspecified joint: Secondary | ICD-10-CM | POA: Insufficient documentation

## 2008-06-13 DIAGNOSIS — M791 Myalgia, unspecified site: Secondary | ICD-10-CM | POA: Insufficient documentation

## 2008-06-13 DIAGNOSIS — IMO0001 Reserved for inherently not codable concepts without codable children: Secondary | ICD-10-CM | POA: Insufficient documentation

## 2008-06-14 ENCOUNTER — Telehealth (INDEPENDENT_AMBULATORY_CARE_PROVIDER_SITE_OTHER): Payer: Self-pay | Admitting: *Deleted

## 2008-06-14 ENCOUNTER — Encounter (INDEPENDENT_AMBULATORY_CARE_PROVIDER_SITE_OTHER): Payer: Self-pay | Admitting: *Deleted

## 2008-06-14 LAB — CONVERTED CEMR LAB
Anti Nuclear Antibody(ANA): NEGATIVE
Vit D, 1,25-Dihydroxy: 30 (ref 30–89)

## 2008-06-19 ENCOUNTER — Telehealth (INDEPENDENT_AMBULATORY_CARE_PROVIDER_SITE_OTHER): Payer: Self-pay | Admitting: *Deleted

## 2008-06-19 LAB — CONVERTED CEMR LAB
ALT: 21 units/L (ref 0–35)
AST: 20 units/L (ref 0–37)
Albumin: 3.7 g/dL (ref 3.5–5.2)
Alkaline Phosphatase: 68 units/L (ref 39–117)
BUN: 5 mg/dL — ABNORMAL LOW (ref 6–23)
Basophils Absolute: 0 10*3/uL (ref 0.0–0.1)
Basophils Relative: 0 % (ref 0.0–3.0)
Bilirubin, Direct: 0.1 mg/dL (ref 0.0–0.3)
CO2: 30 meq/L (ref 19–32)
Calcium: 9.4 mg/dL (ref 8.4–10.5)
Chloride: 103 meq/L (ref 96–112)
Cortisol, Plasma: 7.8 ug/dL
Creatinine, Ser: 0.8 mg/dL (ref 0.4–1.2)
Eosinophils Absolute: 0.2 10*3/uL (ref 0.0–0.7)
Eosinophils Relative: 2.2 % (ref 0.0–5.0)
GFR calc Af Amer: 97 mL/min
GFR calc non Af Amer: 80 mL/min
Glucose, Bld: 160 mg/dL — ABNORMAL HIGH (ref 70–99)
HCT: 35.5 % — ABNORMAL LOW (ref 36.0–46.0)
Hemoglobin: 11.9 g/dL — ABNORMAL LOW (ref 12.0–15.0)
Lymphocytes Relative: 27.6 % (ref 12.0–46.0)
MCHC: 33.5 g/dL (ref 30.0–36.0)
MCV: 82.7 fL (ref 78.0–100.0)
Monocytes Absolute: 0.1 10*3/uL (ref 0.1–1.0)
Monocytes Relative: 1.2 % — ABNORMAL LOW (ref 3.0–12.0)
Neutro Abs: 7.5 10*3/uL (ref 1.4–7.7)
Neutrophils Relative %: 69 % (ref 43.0–77.0)
Platelets: 311 10*3/uL (ref 150–400)
Potassium: 4.1 meq/L (ref 3.5–5.1)
RBC: 4.3 M/uL (ref 3.87–5.11)
RDW: 13.5 % (ref 11.5–14.6)
Rhuematoid fact SerPl-aCnc: <20 intl units/mL — ABNORMAL LOW (ref 0.0–20.0)
Sed Rate: 34 mm/hr — ABNORMAL HIGH (ref 0–22)
Sodium: 139 meq/L (ref 135–145)
TSH: 2.43 microintl units/mL (ref 0.35–5.50)
Total Bilirubin: 0.6 mg/dL (ref 0.3–1.2)
Total Protein: 7.4 g/dL (ref 6.0–8.3)
WBC: 10.8 10*3/uL — ABNORMAL HIGH (ref 4.5–10.5)

## 2008-06-27 ENCOUNTER — Ambulatory Visit: Payer: Self-pay | Admitting: Family Medicine

## 2008-06-28 ENCOUNTER — Telehealth (INDEPENDENT_AMBULATORY_CARE_PROVIDER_SITE_OTHER): Payer: Self-pay | Admitting: *Deleted

## 2008-06-28 LAB — CONVERTED CEMR LAB
Cholesterol: 93 mg/dL (ref 0–200)
HDL: 33.3 mg/dL — ABNORMAL LOW (ref >39.0)
Hgb A1c MFr Bld: 7.4 % — ABNORMAL HIGH (ref 4.6–6.0)
LDL Cholesterol: 47 mg/dL (ref 0–99)
Total CHOL/HDL Ratio: 2.8
Triglycerides: 62 mg/dL (ref 0–149)
VLDL: 12 mg/dL (ref 0–40)

## 2008-07-02 ENCOUNTER — Telehealth (INDEPENDENT_AMBULATORY_CARE_PROVIDER_SITE_OTHER): Payer: Self-pay | Admitting: *Deleted

## 2008-07-04 ENCOUNTER — Telehealth (INDEPENDENT_AMBULATORY_CARE_PROVIDER_SITE_OTHER): Payer: Self-pay | Admitting: *Deleted

## 2008-07-15 ENCOUNTER — Ambulatory Visit: Payer: Self-pay | Admitting: Family Medicine

## 2008-07-15 DIAGNOSIS — D6489 Other specified anemias: Secondary | ICD-10-CM | POA: Insufficient documentation

## 2008-07-15 DIAGNOSIS — E559 Vitamin D deficiency, unspecified: Secondary | ICD-10-CM | POA: Insufficient documentation

## 2008-07-17 LAB — CONVERTED CEMR LAB
Basophils Absolute: 0.1 10*3/uL (ref 0.0–0.1)
Basophils Relative: 0.8 % (ref 0.0–3.0)
Eosinophils Absolute: 0.3 10*3/uL (ref 0.0–0.7)
Eosinophils Relative: 1.9 % (ref 0.0–5.0)
HCT: 38.8 % (ref 36.0–46.0)
Hemoglobin: 13 g/dL (ref 12.0–15.0)
Iron: 43 ug/dL (ref 42–145)
Lymphocytes Relative: 25.2 % (ref 12.0–46.0)
MCHC: 33.5 g/dL (ref 30.0–36.0)
MCV: 82.4 fL (ref 78.0–100.0)
Monocytes Absolute: 1.1 10*3/uL — ABNORMAL HIGH (ref 0.1–1.0)
Monocytes Relative: 6.8 % (ref 3.0–12.0)
Neutro Abs: 10.5 10*3/uL — ABNORMAL HIGH (ref 1.4–7.7)
Neutrophils Relative %: 65.3 % (ref 43.0–77.0)
Platelets: 275 10*3/uL (ref 150–400)
RBC: 4.71 M/uL (ref 3.87–5.11)
RDW: 13.5 % (ref 11.5–14.6)
Saturation Ratios: 11.3 % — ABNORMAL LOW (ref 20.0–50.0)
Transferrin: 271.8 mg/dL (ref >=212.0)
Vit D, 1,25-Dihydroxy: 54 (ref 30–89)
WBC: 16.1 10*3/uL — ABNORMAL HIGH (ref 4.5–10.5)

## 2008-07-19 ENCOUNTER — Encounter (INDEPENDENT_AMBULATORY_CARE_PROVIDER_SITE_OTHER): Payer: Self-pay | Admitting: *Deleted

## 2008-08-06 ENCOUNTER — Telehealth (INDEPENDENT_AMBULATORY_CARE_PROVIDER_SITE_OTHER): Payer: Self-pay | Admitting: *Deleted

## 2008-08-23 ENCOUNTER — Telehealth (INDEPENDENT_AMBULATORY_CARE_PROVIDER_SITE_OTHER): Payer: Self-pay | Admitting: *Deleted

## 2008-09-25 ENCOUNTER — Telehealth (INDEPENDENT_AMBULATORY_CARE_PROVIDER_SITE_OTHER): Payer: Self-pay | Admitting: *Deleted

## 2008-10-08 ENCOUNTER — Encounter: Payer: Self-pay | Admitting: Family Medicine

## 2008-10-27 ENCOUNTER — Emergency Department (HOSPITAL_BASED_OUTPATIENT_CLINIC_OR_DEPARTMENT_OTHER): Admission: EM | Admit: 2008-10-27 | Discharge: 2008-10-27 | Payer: Self-pay | Admitting: Emergency Medicine

## 2008-11-04 ENCOUNTER — Ambulatory Visit: Payer: Self-pay | Admitting: Occupational Medicine

## 2008-11-13 ENCOUNTER — Telehealth (INDEPENDENT_AMBULATORY_CARE_PROVIDER_SITE_OTHER): Payer: Self-pay | Admitting: *Deleted

## 2008-11-13 ENCOUNTER — Ambulatory Visit: Payer: Self-pay | Admitting: Family Medicine

## 2008-11-14 ENCOUNTER — Encounter: Payer: Self-pay | Admitting: Family Medicine

## 2008-11-14 LAB — CONVERTED CEMR LAB
Basophils Absolute: 0 10*3/uL (ref 0.0–0.1)
Basophils Relative: 0.4 % (ref 0.0–3.0)
Eosinophils Absolute: 0.3 10*3/uL (ref 0.0–0.7)
Eosinophils Relative: 2.7 % (ref 0.0–5.0)
HCT: 36.8 % (ref 36.0–46.0)
Hemoglobin: 12.6 g/dL (ref 12.0–15.0)
Lymphocytes Relative: 24.5 % (ref 12.0–46.0)
Lymphs Abs: 2.9 10*3/uL (ref 0.7–4.0)
MCHC: 34.3 g/dL (ref 30.0–36.0)
MCV: 83.8 fL (ref 78.0–100.0)
Monocytes Absolute: 0.1 10*3/uL (ref 0.1–1.0)
Monocytes Relative: 1.2 % — ABNORMAL LOW (ref 3.0–12.0)
Neutro Abs: 8.5 10*3/uL — ABNORMAL HIGH (ref 1.4–7.7)
Neutrophils Relative %: 71.2 % (ref 43.0–77.0)
Platelets: 255 10*3/uL (ref 150.0–400.0)
RBC: 4.39 M/uL (ref 3.87–5.11)
RDW: 13.4 % (ref 11.5–14.6)
WBC: 11.8 10*3/uL — ABNORMAL HIGH (ref 4.5–10.5)

## 2008-11-15 ENCOUNTER — Encounter (INDEPENDENT_AMBULATORY_CARE_PROVIDER_SITE_OTHER): Payer: Self-pay | Admitting: *Deleted

## 2008-11-17 LAB — CONVERTED CEMR LAB
ALT: 29 units/L (ref 0–35)
AST: 25 units/L (ref 0–37)
Albumin: 4 g/dL (ref 3.5–5.2)
Alkaline Phosphatase: 73 units/L (ref 39–117)
BUN: 8 mg/dL (ref 6–23)
Bilirubin, Direct: 0.1 mg/dL (ref 0.0–0.3)
CO2: 25 meq/L (ref 19–32)
Calcium: 9.2 mg/dL (ref 8.4–10.5)
Chloride: 102 meq/L (ref 96–112)
Cholesterol: 97 mg/dL (ref 0–200)
Creatinine, Ser: 0.85 mg/dL (ref 0.40–1.20)
Glucose, Bld: 129 mg/dL — ABNORMAL HIGH (ref 70–99)
HDL: 40 mg/dL (ref >39)
Hgb A1c MFr Bld: 7.2 % — ABNORMAL HIGH (ref 4.6–6.1)
Indirect Bilirubin: 0.4 mg/dL (ref 0.0–0.9)
LDL Cholesterol: 41 mg/dL (ref 0–99)
Potassium: 4.8 meq/L (ref 3.5–5.3)
Sodium: 138 meq/L (ref 135–145)
Total Bilirubin: 0.5 mg/dL (ref 0.3–1.2)
Total CHOL/HDL Ratio: 2.4
Total Protein: 7.3 g/dL (ref 6.0–8.3)
Triglycerides: 80 mg/dL (ref <150)
VLDL: 16 mg/dL (ref 0–40)

## 2008-11-18 ENCOUNTER — Telehealth (INDEPENDENT_AMBULATORY_CARE_PROVIDER_SITE_OTHER): Payer: Self-pay | Admitting: *Deleted

## 2008-11-18 ENCOUNTER — Encounter (INDEPENDENT_AMBULATORY_CARE_PROVIDER_SITE_OTHER): Payer: Self-pay | Admitting: *Deleted

## 2008-11-18 ENCOUNTER — Ambulatory Visit: Payer: Self-pay | Admitting: Family Medicine

## 2008-11-18 DIAGNOSIS — R21 Rash and other nonspecific skin eruption: Secondary | ICD-10-CM | POA: Insufficient documentation

## 2008-11-22 ENCOUNTER — Telehealth (INDEPENDENT_AMBULATORY_CARE_PROVIDER_SITE_OTHER): Payer: Self-pay | Admitting: *Deleted

## 2008-11-27 LAB — HM DIABETES EYE EXAM

## 2008-12-23 ENCOUNTER — Telehealth (INDEPENDENT_AMBULATORY_CARE_PROVIDER_SITE_OTHER): Payer: Self-pay | Admitting: *Deleted

## 2009-01-02 ENCOUNTER — Ambulatory Visit: Payer: Self-pay | Admitting: Family Medicine

## 2009-01-03 ENCOUNTER — Encounter: Payer: Self-pay | Admitting: Family Medicine

## 2009-01-30 ENCOUNTER — Ambulatory Visit: Payer: Self-pay | Admitting: Family Medicine

## 2009-01-30 LAB — HM DIABETES FOOT EXAM

## 2009-02-03 ENCOUNTER — Encounter: Payer: Self-pay | Admitting: Family Medicine

## 2009-02-24 ENCOUNTER — Ambulatory Visit: Payer: Self-pay | Admitting: Endocrinology

## 2009-02-26 LAB — CONVERTED CEMR LAB: Hgb A1c MFr Bld: 7.5 % — ABNORMAL HIGH (ref 4.6–6.5)

## 2009-03-03 ENCOUNTER — Ambulatory Visit: Payer: Self-pay | Admitting: Family Medicine

## 2009-04-03 ENCOUNTER — Ambulatory Visit: Payer: Self-pay | Admitting: Family Medicine

## 2009-04-08 ENCOUNTER — Telehealth: Payer: Self-pay | Admitting: Family Medicine

## 2009-04-08 ENCOUNTER — Ambulatory Visit: Payer: Self-pay | Admitting: Family Medicine

## 2009-04-08 DIAGNOSIS — R197 Diarrhea, unspecified: Secondary | ICD-10-CM | POA: Insufficient documentation

## 2009-04-08 DIAGNOSIS — R110 Nausea: Secondary | ICD-10-CM | POA: Insufficient documentation

## 2009-04-09 ENCOUNTER — Encounter: Payer: Self-pay | Admitting: Family Medicine

## 2009-04-09 LAB — CONVERTED CEMR LAB
ALT: 32 units/L (ref 0–35)
AST: 25 units/L (ref 0–37)
Albumin: 3.9 g/dL (ref 3.5–5.2)
Alkaline Phosphatase: 75 units/L (ref 39–117)
BUN: 8 mg/dL (ref 6–23)
Basophils Absolute: 0 10*3/uL (ref 0.0–0.1)
Basophils Relative: 0 % (ref 0.0–3.0)
Bilirubin, Direct: 0 mg/dL (ref 0.0–0.3)
CO2: 32 meq/L (ref 19–32)
Calcium: 9.8 mg/dL (ref 8.4–10.5)
Chloride: 102 meq/L (ref 96–112)
Creatinine, Ser: 0.8 mg/dL (ref 0.4–1.2)
Eosinophils Absolute: 0.2 10*3/uL (ref 0.0–0.7)
Eosinophils Relative: 2.6 % (ref 0.0–5.0)
GFR calc non Af Amer: 79.97 mL/min (ref >60)
Glucose, Bld: 179 mg/dL — ABNORMAL HIGH (ref 70–99)
HCT: 38.4 % (ref 36.0–46.0)
Hemoglobin: 12.8 g/dL (ref 12.0–15.0)
Lymphocytes Relative: 27.1 % (ref 12.0–46.0)
Lymphs Abs: 2.4 10*3/uL (ref 0.7–4.0)
MCHC: 33.3 g/dL (ref 30.0–36.0)
MCV: 83.3 fL (ref 78.0–100.0)
Monocytes Absolute: 0.2 10*3/uL (ref 0.1–1.0)
Monocytes Relative: 1.9 % — ABNORMAL LOW (ref 3.0–12.0)
Neutro Abs: 6.1 10*3/uL (ref 1.4–7.7)
Neutrophils Relative %: 68.4 % (ref 43.0–77.0)
Platelets: 274 10*3/uL (ref 150.0–400.0)
Potassium: 4.4 meq/L (ref 3.5–5.1)
RBC: 4.61 M/uL (ref 3.87–5.11)
RDW: 13.7 % (ref 11.5–14.6)
Sodium: 139 meq/L (ref 135–145)
TSH: 1.91 microintl units/mL (ref 0.35–5.50)
Total Bilirubin: 0.6 mg/dL (ref 0.3–1.2)
Total Protein: 7.3 g/dL (ref 6.0–8.3)
WBC: 8.9 10*3/uL (ref 4.5–10.5)

## 2009-04-15 ENCOUNTER — Telehealth: Payer: Self-pay | Admitting: Pulmonary Disease

## 2009-04-16 ENCOUNTER — Ambulatory Visit: Payer: Self-pay | Admitting: Pulmonary Disease

## 2009-04-16 DIAGNOSIS — J209 Acute bronchitis, unspecified: Secondary | ICD-10-CM | POA: Insufficient documentation

## 2009-04-24 ENCOUNTER — Telehealth (INDEPENDENT_AMBULATORY_CARE_PROVIDER_SITE_OTHER): Payer: Self-pay | Admitting: *Deleted

## 2009-04-25 ENCOUNTER — Encounter: Payer: Self-pay | Admitting: Family Medicine

## 2009-04-27 ENCOUNTER — Ambulatory Visit: Payer: Self-pay | Admitting: Family Medicine

## 2009-04-27 DIAGNOSIS — R05 Cough: Secondary | ICD-10-CM

## 2009-04-27 DIAGNOSIS — R059 Cough, unspecified: Secondary | ICD-10-CM | POA: Insufficient documentation

## 2009-04-29 ENCOUNTER — Ambulatory Visit: Payer: Self-pay | Admitting: Family Medicine

## 2009-04-29 DIAGNOSIS — J029 Acute pharyngitis, unspecified: Secondary | ICD-10-CM | POA: Insufficient documentation

## 2009-04-30 ENCOUNTER — Telehealth: Payer: Self-pay | Admitting: Family Medicine

## 2009-04-30 ENCOUNTER — Encounter: Payer: Self-pay | Admitting: Family Medicine

## 2009-04-30 ENCOUNTER — Ambulatory Visit: Payer: Self-pay | Admitting: Internal Medicine

## 2009-05-05 ENCOUNTER — Encounter: Payer: Self-pay | Admitting: Family Medicine

## 2009-05-08 ENCOUNTER — Ambulatory Visit: Payer: Self-pay | Admitting: Family Medicine

## 2009-05-09 ENCOUNTER — Ambulatory Visit (HOSPITAL_COMMUNITY): Admission: RE | Admit: 2009-05-09 | Discharge: 2009-05-09 | Payer: Self-pay | Admitting: Obstetrics and Gynecology

## 2009-05-15 ENCOUNTER — Telehealth: Payer: Self-pay | Admitting: Family Medicine

## 2009-06-10 ENCOUNTER — Ambulatory Visit: Payer: Self-pay | Admitting: Family Medicine

## 2009-06-10 DIAGNOSIS — N39498 Other specified urinary incontinence: Secondary | ICD-10-CM | POA: Insufficient documentation

## 2009-06-30 ENCOUNTER — Ambulatory Visit: Payer: Self-pay | Admitting: Family Medicine

## 2009-06-30 DIAGNOSIS — B351 Tinea unguium: Secondary | ICD-10-CM | POA: Insufficient documentation

## 2009-07-01 ENCOUNTER — Encounter (INDEPENDENT_AMBULATORY_CARE_PROVIDER_SITE_OTHER): Payer: Self-pay | Admitting: *Deleted

## 2009-07-01 LAB — CONVERTED CEMR LAB: Vit D, 25-Hydroxy: 46 ng/mL (ref 30–89)

## 2009-07-02 ENCOUNTER — Telehealth (INDEPENDENT_AMBULATORY_CARE_PROVIDER_SITE_OTHER): Payer: Self-pay | Admitting: *Deleted

## 2009-07-02 LAB — CONVERTED CEMR LAB
ALT: 32 units/L (ref 0–35)
AST: 28 units/L (ref 0–37)
Albumin: 3.8 g/dL (ref 3.5–5.2)
Alkaline Phosphatase: 71 units/L (ref 39–117)
BUN: 6 mg/dL (ref 6–23)
Bilirubin, Direct: 0 mg/dL (ref 0.0–0.3)
CO2: 30 meq/L (ref 19–32)
Calcium: 9.3 mg/dL (ref 8.4–10.5)
Chloride: 101 meq/L (ref 96–112)
Cholesterol: 111 mg/dL (ref 0–200)
Creatinine, Ser: 0.8 mg/dL (ref 0.4–1.2)
Creatinine,U: 120.8 mg/dL
GFR calc non Af Amer: 79.91 mL/min (ref >60)
Glucose, Bld: 135 mg/dL — ABNORMAL HIGH (ref 70–99)
HDL: 39.7 mg/dL (ref >39.00)
Hgb A1c MFr Bld: 8.7 % — ABNORMAL HIGH (ref 4.6–6.5)
LDL Cholesterol: 52 mg/dL (ref 0–99)
Microalb Creat Ratio: 6.6 mg/g (ref 0.0–30.0)
Microalb, Ur: 0.8 mg/dL (ref 0.0–1.9)
Potassium: 4.4 meq/L (ref 3.5–5.1)
Sodium: 139 meq/L (ref 135–145)
Total Bilirubin: 0.9 mg/dL (ref 0.3–1.2)
Total CHOL/HDL Ratio: 3
Total Protein: 7.4 g/dL (ref 6.0–8.3)
Triglycerides: 97 mg/dL (ref 0.0–149.0)
VLDL: 19.4 mg/dL (ref 0.0–40.0)

## 2009-07-07 ENCOUNTER — Encounter: Payer: Self-pay | Admitting: Family Medicine

## 2009-07-16 ENCOUNTER — Encounter (INDEPENDENT_AMBULATORY_CARE_PROVIDER_SITE_OTHER): Payer: Self-pay | Admitting: *Deleted

## 2009-07-23 ENCOUNTER — Telehealth (INDEPENDENT_AMBULATORY_CARE_PROVIDER_SITE_OTHER): Payer: Self-pay | Admitting: *Deleted

## 2009-08-29 ENCOUNTER — Telehealth (INDEPENDENT_AMBULATORY_CARE_PROVIDER_SITE_OTHER): Payer: Self-pay | Admitting: *Deleted

## 2009-08-29 ENCOUNTER — Telehealth: Payer: Self-pay | Admitting: Endocrinology

## 2009-08-29 ENCOUNTER — Encounter: Payer: Self-pay | Admitting: Family Medicine

## 2009-09-01 ENCOUNTER — Telehealth (INDEPENDENT_AMBULATORY_CARE_PROVIDER_SITE_OTHER): Payer: Self-pay | Admitting: *Deleted

## 2009-09-03 ENCOUNTER — Telehealth: Payer: Self-pay | Admitting: Family Medicine

## 2009-09-18 ENCOUNTER — Encounter: Payer: Self-pay | Admitting: Family Medicine

## 2009-09-22 ENCOUNTER — Encounter: Payer: Self-pay | Admitting: Family Medicine

## 2009-09-30 ENCOUNTER — Ambulatory Visit (HOSPITAL_COMMUNITY): Admission: RE | Admit: 2009-09-30 | Discharge: 2009-09-30 | Payer: Self-pay | Admitting: Surgery

## 2009-10-02 ENCOUNTER — Ambulatory Visit (HOSPITAL_COMMUNITY): Admission: RE | Admit: 2009-10-02 | Discharge: 2009-10-02 | Payer: Self-pay | Admitting: Surgery

## 2009-10-13 ENCOUNTER — Ambulatory Visit: Payer: Self-pay | Admitting: Family Medicine

## 2009-10-16 ENCOUNTER — Telehealth: Payer: Self-pay | Admitting: Family Medicine

## 2009-10-22 ENCOUNTER — Ambulatory Visit (HOSPITAL_BASED_OUTPATIENT_CLINIC_OR_DEPARTMENT_OTHER): Admission: RE | Admit: 2009-10-22 | Discharge: 2009-10-22 | Payer: Self-pay | Admitting: Surgery

## 2009-10-23 ENCOUNTER — Encounter: Payer: Self-pay | Admitting: Family Medicine

## 2009-10-25 ENCOUNTER — Ambulatory Visit: Payer: Self-pay | Admitting: Internal Medicine

## 2009-11-13 ENCOUNTER — Encounter: Admission: RE | Admit: 2009-11-13 | Discharge: 2010-02-11 | Payer: Self-pay | Admitting: Surgery

## 2009-12-12 ENCOUNTER — Encounter: Payer: Self-pay | Admitting: Family Medicine

## 2009-12-18 ENCOUNTER — Encounter: Payer: Self-pay | Admitting: Family Medicine

## 2009-12-25 ENCOUNTER — Ambulatory Visit: Payer: Self-pay | Admitting: Family Medicine

## 2009-12-30 ENCOUNTER — Ambulatory Visit (HOSPITAL_COMMUNITY): Admission: RE | Admit: 2009-12-30 | Discharge: 2009-12-31 | Payer: Self-pay | Admitting: Surgery

## 2009-12-30 HISTORY — PX: LAPAROSCOPIC GASTRIC BANDING: SHX1100

## 2010-01-22 ENCOUNTER — Telehealth: Payer: Self-pay | Admitting: Family Medicine

## 2010-01-29 ENCOUNTER — Ambulatory Visit: Payer: Self-pay | Admitting: Family Medicine

## 2010-01-29 DIAGNOSIS — Z9884 Bariatric surgery status: Secondary | ICD-10-CM | POA: Insufficient documentation

## 2010-01-30 ENCOUNTER — Encounter: Payer: Self-pay | Admitting: Family Medicine

## 2010-02-11 LAB — CONVERTED CEMR LAB
ALT: 44 units/L — ABNORMAL HIGH (ref 0–35)
AST: 33 units/L (ref 0–37)
Albumin: 4.3 g/dL (ref 3.5–5.2)
Alkaline Phosphatase: 84 units/L (ref 39–117)
BUN: 12 mg/dL (ref 6–23)
Basophils Absolute: 0 10*3/uL (ref 0.0–0.1)
Basophils Relative: 0.5 % (ref 0.0–3.0)
Bilirubin, Direct: 0.2 mg/dL (ref 0.0–0.3)
CO2: 29 meq/L (ref 19–32)
Calcium: 9.8 mg/dL (ref 8.4–10.5)
Chloride: 102 meq/L (ref 96–112)
Cholesterol: 155 mg/dL (ref 0–200)
Creatinine, Ser: 1 mg/dL (ref 0.4–1.2)
Eosinophils Absolute: 0.2 10*3/uL (ref 0.0–0.7)
Eosinophils Relative: 1.9 % (ref 0.0–5.0)
Ferritin: 25.9 ng/mL (ref 10.0–291.0)
Folate: 8.2 ng/mL
GFR calc non Af Amer: 59.56 mL/min (ref >60)
Glucose, Bld: 108 mg/dL — ABNORMAL HIGH (ref 70–99)
HCT: 39.1 % (ref 36.0–46.0)
HDL: 38.5 mg/dL — ABNORMAL LOW (ref >39.00)
Hemoglobin: 13 g/dL (ref 12.0–15.0)
Hgb A1c MFr Bld: 6.6 % — ABNORMAL HIGH (ref 4.6–6.5)
Iron: 32 ug/dL — ABNORMAL LOW (ref 42–145)
LDL Cholesterol: 87 mg/dL (ref 0–99)
Lymphocytes Relative: 28.4 % (ref 12.0–46.0)
Lymphs Abs: 2.9 10*3/uL (ref 0.7–4.0)
MCHC: 33.3 g/dL (ref 30.0–36.0)
MCV: 83.8 fL (ref 78.0–100.0)
Monocytes Absolute: 0.7 10*3/uL (ref 0.1–1.0)
Monocytes Relative: 6.4 % (ref 3.0–12.0)
Neutro Abs: 6.4 10*3/uL (ref 1.4–7.7)
Neutrophils Relative %: 62.8 % (ref 43.0–77.0)
Platelets: 300 10*3/uL (ref 150.0–400.0)
Potassium: 4.4 meq/L (ref 3.5–5.1)
RBC: 4.66 M/uL (ref 3.87–5.11)
RDW: 14.7 % — ABNORMAL HIGH (ref 11.5–14.6)
Saturation Ratios: 7.6 % — ABNORMAL LOW (ref 20.0–50.0)
Sodium: 142 meq/L (ref 135–145)
TSH: 1.71 microintl units/mL (ref 0.35–5.50)
Total Bilirubin: 0.7 mg/dL (ref 0.3–1.2)
Total CHOL/HDL Ratio: 4
Total Protein: 7.5 g/dL (ref 6.0–8.3)
Transferrin: 301.1 mg/dL (ref 212.0–360.0)
Triglycerides: 147 mg/dL (ref 0.0–149.0)
VLDL: 29.4 mg/dL (ref 0.0–40.0)
Vit D, 25-Hydroxy: 59 ng/mL (ref 30–89)
Vitamin B-12: 226 pg/mL (ref 211–911)
WBC: 10.2 10*3/uL (ref 4.5–10.5)

## 2010-02-16 ENCOUNTER — Telehealth: Payer: Self-pay | Admitting: Family Medicine

## 2010-02-19 ENCOUNTER — Encounter: Admission: RE | Admit: 2010-02-19 | Discharge: 2010-02-19 | Payer: Self-pay | Admitting: Surgery

## 2010-04-01 ENCOUNTER — Ambulatory Visit: Payer: Self-pay | Admitting: Family Medicine

## 2010-04-01 DIAGNOSIS — E538 Deficiency of other specified B group vitamins: Secondary | ICD-10-CM | POA: Insufficient documentation

## 2010-04-02 LAB — CONVERTED CEMR LAB
Folate: 4.3 ng/mL
Vitamin B-12: 411 pg/mL (ref 211–911)

## 2010-04-15 ENCOUNTER — Encounter
Admission: RE | Admit: 2010-04-15 | Discharge: 2010-06-09 | Payer: Self-pay | Source: Home / Self Care | Attending: Surgery | Admitting: Surgery

## 2010-05-11 ENCOUNTER — Ambulatory Visit (HOSPITAL_COMMUNITY): Admission: RE | Admit: 2010-05-11 | Discharge: 2010-05-11 | Payer: Self-pay | Admitting: Obstetrics and Gynecology

## 2010-05-14 ENCOUNTER — Encounter: Admission: RE | Admit: 2010-05-14 | Discharge: 2010-05-14 | Payer: Self-pay | Admitting: Obstetrics and Gynecology

## 2010-06-25 ENCOUNTER — Telehealth (INDEPENDENT_AMBULATORY_CARE_PROVIDER_SITE_OTHER): Payer: Self-pay | Admitting: *Deleted

## 2010-07-10 ENCOUNTER — Ambulatory Visit: Payer: Self-pay | Admitting: Family Medicine

## 2010-07-21 ENCOUNTER — Encounter
Admission: RE | Admit: 2010-07-21 | Discharge: 2010-08-11 | Payer: Self-pay | Source: Home / Self Care | Attending: Surgery | Admitting: Surgery

## 2010-07-29 ENCOUNTER — Encounter: Payer: Self-pay | Admitting: Family Medicine

## 2010-08-01 ENCOUNTER — Encounter: Payer: Self-pay | Admitting: Obstetrics and Gynecology

## 2010-08-01 ENCOUNTER — Ambulatory Visit
Admission: RE | Admit: 2010-08-01 | Discharge: 2010-08-01 | Payer: Self-pay | Source: Home / Self Care | Admitting: Family Medicine

## 2010-08-01 DIAGNOSIS — R109 Unspecified abdominal pain: Secondary | ICD-10-CM | POA: Insufficient documentation

## 2010-08-01 LAB — CONVERTED CEMR LAB
Bilirubin Urine: NEGATIVE
Blood in Urine, dipstick: NEGATIVE
Glucose, Urine, Semiquant: NEGATIVE
Ketones, urine, test strip: NEGATIVE
Nitrite: NEGATIVE
Protein, U semiquant: NEGATIVE
Specific Gravity, Urine: <1.005
Urobilinogen, UA: 0.2
WBC Urine, dipstick: NEGATIVE
pH: 5.5

## 2010-08-02 ENCOUNTER — Telehealth (INDEPENDENT_AMBULATORY_CARE_PROVIDER_SITE_OTHER): Payer: Self-pay | Admitting: *Deleted

## 2010-08-02 ENCOUNTER — Encounter: Payer: Self-pay | Admitting: Orthopaedic Surgery

## 2010-08-02 ENCOUNTER — Encounter: Payer: Self-pay | Admitting: Cardiovascular Disease

## 2010-08-03 ENCOUNTER — Encounter: Payer: Self-pay | Admitting: Family Medicine

## 2010-08-11 NOTE — Assessment & Plan Note (Signed)
Summary: LEFT FOURTH TOE RED /FLAMED/KDC  Medications Added KEFLEX 500 MG  CAPS (CEPHALEXIN) 1 by mouth two times a day for 10 days        Vital Signs:  Patient Profile:   55 Years Old Female Pulse rate:   72 / minute BP sitting:   135 / 82  (left arm)  Vitals Entered By: Doristine Devoid (August 12, 2007 2:01 PM)                 PCP:  Laury Axon  Chief Complaint:  left 4th toe red and swollen some bruising.  History of Present Illness: dropped something on her toe- then it turned black and blue- about a week ago then tuesday- it started getting a little red yesterday swollen and red- just 4th toe on L foot  is sore and painful to the touch  is a diabetic - sugar control has been good- last AIC was 6.3  ? in general no diabetic neuropathy  no fever- overall feels ok took some tyleonol and advil  stress- cares for elderly parents    Current Allergies: ! SEPTRA ! BACTRIM ! MACRODANTIN ! MORPHINE ! * FENTANYL ! PHENERGAN CODEINE PHOSPHATE (CODEINE PHOSPHATE) * INH * IVP DYE MORPHINE SULFATE (MORPHINE SULFATE) PENICILLIN G POTASSIUM (PENICILLIN G POTASSIUM) TRIMETHOPRIM (TRIMETHOPRIM)     Review of Systems      See HPI  General      Denies malaise.  Resp      Denies shortness of breath.  Derm      Denies rash.   Physical Exam  General:     Well-developed,well-nourished,in no acute distress; alert,appropriate and cooperative throughout examination Neck:     No deformities, masses, or tenderness noted. Lungs:     Normal respiratory effort, chest expands symmetrically. Lungs are clear to auscultation, no crackles or wheezes. Heart:     Normal rate and regular rhythm. S1 and S2 normal without gallop, murmur, click, rub or other extra sounds. Msk:     4th toe of L foot- erythema and swelling to base of toe nl rom diffusely tender nl sensation no drainage of skin breakdown Pulses:     R and L carotid,radial,femoral,dorsalis pedis and posterior  tibial pulses are full and equal bilaterally Neurologic:     sensation intact to light touch and DTRs symmetrical and normal.   Skin:     no rash Psych:     nl affect    Impression & Recommendations:  Problem # 1:  CELLULITIS (ICD-682.9) L 4th toe after trauma  will tx with keflex and monitor closely - given pt's diabetes call for f/u if not greatly improved on monday soaks/ warm compress/sympt care  Her updated medication list for this problem includes:    Keflex 500 Mg Caps (Cephalexin) .Marland Kitchen... 1 by mouth two times a day for 10 days   Complete Medication List: 1)  Mobic 15 Mg Tabs (Meloxicam) .... Take 1 tablet by mouth once a day 2)  Ambien 10 Mg Tabs (Zolpidem tartrate) .... Take 1 tablet by mouth once a day 3)  Nasonex 50 Mcg/act Susp (Mometasone furoate) .... Spray 2 spray into both nostrils every morning 4)  Celexa 40 Mg Tabs (Citalopram hydrobromide) .Marland Kitchen.. 1 by mouth once daily 5)  Benicar 40 Mg Tabs (Olmesartan medoxomil) .... Take 1 tablet by mouth once a day 6)  Prevacid 30 Mg Cpdr (Lansoprazole) .Marland Kitchen.. 1 capsule by mouth once a day 7)  Clarinex 5 Mg Tabs (Desloratadine) .Marland KitchenMarland KitchenMarland Kitchen  Take 1 tablet by mouth once a day 8)  Lasix 20 Mg Tabs (Furosemide) .... Take 2 tablet by mouth once a day 9)  Vivelle-dot 0.1 Mg/24hr Pttw (Estradiol) 10)  Crestor 20 Mg Tabs (Rosuvastatin calcium) .Marland Kitchen.. 1 by mouth once daily 11)  Klonopin 0.5 Mg Tabs (Clonazepam) .Marland Kitchen.. 1 by mouth bid 12)  Glucophage Xr 500 Mg Tb24 (Metformin hcl) .... 4 tablets daily 13)  Januvia 100 Mg Tabs (Sitagliptin phosphate) .... Qd 14)  Klor-con M10 10 Meq Tbcr (Potassium chloride crys cr) .Marland Kitchen.. 1 by mouth once daily 15)  Fluconazole 150 Mg Tabs (Fluconazole) .Marland Kitchen.. 1 by mouth  x 1 16)  Valium 2 Mg Tabs (Diazepam) .Marland Kitchen.. 1-2 by mouth bid 17)  Keflex 500 Mg Caps (Cephalexin) .Marland Kitchen.. 1 by mouth two times a day for 10 days   Patient Instructions: 1)  try warm compresses and soaks on toe several times daily 2)  watch closely for  drainage , increased redness or swelling or pain 3)  watch closely for fever 4)  take keflex as directed 5)  if not improving by early next week- call your primary care physician    Prescriptions: KEFLEX 500 MG  CAPS (CEPHALEXIN) 1 by mouth two times a day for 10 days  #20 x 0   Entered and Authorized by:   Judith Part MD   Signed by:   Judith Part MD on 08/12/2007   Method used:   Print then Give to Patient   RxID:   [id number]  ]

## 2010-08-11 NOTE — Progress Notes (Signed)
  Phone Note Call from Patient Call back at Home Phone [phone]   Caller: Patient/[phone] Call For: Dr Lorane Gell Summary of Call: per pt call can she started she heard her lab results on phone tree dr ellsion mentioned she should try the welchol--best option--please let him know pt wanted to know if she can try samples of the medication before she get the rx for it , also her a1c was up because she had an shots (cortisone injections in shoulder 3 weeks ago  does he want her to come back in to be retested Initial call taken by: Shelbie Proctor,  Dec 07, 2007 4:51 PM  Follow-up for Phone Call        welchol sent Follow-up by: Minus Breeding MD,  Dec 08, 2007 1:30 PM  Additional Follow-up for Phone Call Additional follow up Details #1::        Dec 08, 2007 1:52 PM left msg on voice mail  Additional Follow-up by: Shelbie Proctor,  Dec 08, 2007 1:52 PM    Additional Follow-up for Phone Call Additional follow up Details #2::    .  New/Updated Medications: WELCHOL 625 MG  TABS (COLESEVELAM HCL) 6 qd   Prescriptions: WELCHOL 625 MG  TABS (COLESEVELAM HCL) 6 qd  #180 x 11   Entered and Authorized by:   Minus Breeding MD   Signed by:   Minus Breeding MD on 12/08/2007   Method used:   Electronically sent to ...       [facility] [address]       [address]       [place], [place]  [address]       Ph: [phone]       Fax: [phone]   RxID:   [id number]

## 2010-08-11 NOTE — Progress Notes (Signed)
Summary: rash?-dr lowne  Phone Note Call from Patient Call back at Work Phone [phone]   Caller: Patient Summary of Call: PATIENT HAS AN APPT TOMORROW BUT SHE SAID SHE KEEPS BREAKING OUT IN LITTLE "THINGS"--- SHE SAID SHE IS UNABLE TO REACH DR DEVESHWARS OFFICE TO GET LAB FOR VIT D - PHONE JUST RINGS Initial call taken by: Okey Regal [place],  Nov 18, 2008 9:17 AM  Follow-up for Phone Call        Patient coming in today at 1:45 pm Ardyth Man  Nov 18, 2008 10:02 AM  Follow-up by: Ardyth Man,  Nov 18, 2008 10:02 AM

## 2010-08-11 NOTE — Letter (Signed)
Summary: Out of Work  [facility] [employer]  [address] [place], [place] [address]   Phone: [phone]  Fax: [phone]    April 08, 2009   Employee:  FRAN NEISWONGER [employer]    To Whom It May Concern:   For Medical reasons, please excuse the above named employee from work for the following dates:  Start:   April 08, 2009  End:   April 09, 2009  If you need additional information, please feel free to contact our office.         Sincerely,    [name] DO

## 2010-08-11 NOTE — Letter (Signed)
Summary: Results Follow-up Letter  [facility]  [address] [place], [place] [address]   Phone: [phone]  Fax: [phone]    01/18/2008         Telford Nab. [place] [address] [place], [place]  [address]  Dear Ms. Minner,   The following are the results of your recent test(s):  Test     Result     Pap Smear    Normal_______  Not Normal_____       Comments: _________________________________________________________ Cholesterol LDL(Bad cholesterol):          Your goal is less than:         HDL (Good cholesterol):        Your goal is more than: _________________________________________________________ Other Tests:   _________________________________________________________  Please call for an appointment Or __Please call the office to let us know if you are coming off the prednisone, see attached note from Dr. Lowne._______________________________________________________ _________________________________________________________ _________________________________________________________  Sincerely,  Ardyth Man [place] [facility]

## 2010-08-11 NOTE — Progress Notes (Signed)
  Phone Note Refill Request Message from:  Fax from Pharmacy on August 29, 2009 11:36 AM  Which Metformin is pt taking? [facility] is requesting a refill? Please advise?  Initial call taken by: Josph Macho RMA,  August 29, 2009 11:36 AM  Follow-up for Phone Call        metformin-xr 4x500 mg qam. f/u ov is due Follow-up by: Minus Breeding MD,  August 29, 2009 12:37 PM  Additional Follow-up for Phone Call Additional follow up Details #1::         Refilled. I also tried to call pt and there was no answer and no vm. Additional Follow-up by: Josph Macho RMA,  August 29, 2009 1:24 PM    Prescriptions: METFORMIN HCL 500 MG XR24H-TAB (METFORMIN HCL) 4 qam  #360 x 1   Entered by:   Josph Macho RMA   Authorized by:   Minus Breeding MD   Signed by:   Josph Macho RMA on 08/29/2009   Method used:   Electronically to        [facility]* (mail-order)             ,          Ph: [phone]       Fax: [phone]   RxID:   [id number]

## 2010-08-11 NOTE — Assessment & Plan Note (Signed)
Summary: sciatica   Vital Signs:  Patient Profile:   55 Years Old Female Weight:      245.6 pounds Pulse rate:   80 / minute Resp:     18 per minute BP sitting:   132 / 80  (left arm)  Pt. in pain?   no  Vitals Entered By: Ardyth Man (February 15, 2008 4:07 PM)                  PCP:  Laury Axon  Chief Complaint:  sciatica nerve in rt leg.  History of Present Illness: Pt here c/o worsening sciatica R leg.  No known injury.  Pt taking vicodin with no relief.     Current Allergies (reviewed today): ! SEPTRA ! BACTRIM ! MACRODANTIN ! MORPHINE ! * FENTANYL ! PHENERGAN ! PERCOCET CODEINE PHOSPHATE (CODEINE PHOSPHATE) * INH * IVP DYE MORPHINE SULFATE (MORPHINE SULFATE) PENICILLIN G POTASSIUM (PENICILLIN G POTASSIUM) TRIMETHOPRIM (TRIMETHOPRIM)  Past Medical History:    Reviewed history from 06/09/2007 and no changes required:       Allergic rhinitis       Diabetes mellitus, type II       Headache       menopause       gerd       allergic rhinitis       ibs       Anxiety       Depression   Family History:    Reviewed history from 03/07/2007 and no changes required:       Family History Diabetes 1st degree relative (both parents)  Social History:    Reviewed history from 03/07/2007 and no changes required:       Married       Never Smoked       works in hospital   Risk Factors: Tobacco use:  never  Colonoscopy History:    Date of Last Colonoscopy:  01/05/2007   Review of Systems      See HPI   Physical Exam  General:     Well-developed,well-nourished,in no acute distress; alert,appropriate and cooperative throughout examination Msk:     normal ROM, no joint tenderness, no joint swelling, no joint warmth, no redness over joints, no joint deformities, no joint instability, and no crepitation.   Neurologic:     + weakness in R big toe with plantar flexion and ext + weakness in plantar flexion of foot  Skin:     Intact without suspicious  lesions or rashes Psych:     Oriented X3, memory intact for recent and remote, and normally interactive.      Impression & Recommendations:  Problem # 1:  BACK PAIN, LUMBAR (ICD-724.2)  Her updated medication list for this problem includes:    Mobic 15 Mg Tabs (Meloxicam) .Marland Kitchen... Take 1 tablet by mouth once a day    Dilaudid 2 Mg Tabs (Hydromorphone hcl) .Marland Kitchen... 1-2 by mouth every 4 hours as needed pain + valium 5 mg three times a day as needed  Orders: Radiology Referral (Radiology)MRI T-Lumbar Spine 2 Views (72100TC) Discussed use of moist heat or ice, modified activities, medications, and stretching/strengthening exercises. Back care instructions given. To be seen in 2 weeks if no improvement; sooner if worsening of symptoms.  Orders: Radiology Referral (Radiology) T-Lumbar Spine 2 Views (72100TC)   Complete Medication List: 1)  Mobic 15 Mg Tabs (Meloxicam) .... Take 1 tablet by mouth once a day 2)  Ambien 10 Mg Tabs (Zolpidem tartrate) .Marland KitchenMarland KitchenMarland Kitchen  Take 1 tablet by mouth once a day 3)  Nasonex 50 Mcg/act Susp (Mometasone furoate) .... Spray 2 spray into both nostrils every morning 4)  Celexa 40 Mg Tabs (Citalopram hydrobromide) .Marland Kitchen.. 1 by mouth once daily 5)  Benicar 40 Mg Tabs (Olmesartan medoxomil) .... Take 1 tablet by mouth once a day 6)  Prevacid 30 Mg Cpdr (Lansoprazole) .Marland Kitchen.. 1 capsule by mouth once a day 7)  Clarinex 5 Mg Tabs (Desloratadine) .... Take 1 tablet by mouth once a day 8)  Lasix 20 Mg Tabs (Furosemide) .... Take 2 tablet by mouth once a day 9)  Vivelle-dot 0.1 Mg/24hr Pttw (Estradiol) 10)  Crestor 20 Mg Tabs (Rosuvastatin calcium) .Marland Kitchen.. 1 by mouth once daily 11)  Glucophage Xr 500 Mg Tb24 (Metformin hcl) .... 4 tablets daily 12)  Januvia 100 Mg Tabs (Sitagliptin phosphate) .... Qd 13)  Klor-con M10 10 Meq Tbcr (Potassium chloride crys cr) .Marland Kitchen.. 1 by mouth once daily 14)  Lyrica 75 Mg Caps (Pregabalin) .Marland Kitchen.. 1 by mouth two times a day 15)  Welchol 625 Mg Tabs (Colesevelam  hcl) .... 6 qd 16)  Dilaudid 2 Mg Tabs (Hydromorphone hcl) .Marland Kitchen.. 1-2 by mouth every 4 hours as needed pain 17)  Valium 5 Mg Tabs (Diazepam) .Marland Kitchen.. 1 by mouth three times a day as needed    Prescriptions: VALIUM 5 MG  TABS (DIAZEPAM) 1 by mouth three times a day as needed  #30 x 0   Entered and Authorized by:   [name] DO   Signed by:   [name] DO on 02/15/2008   Method used:   Print then Give to Patient   RxID:   [id number] DILAUDID 2 MG  TABS (HYDROMORPHONE HCL) 1-2 by mouth every 4 hours as needed pain  #30 x 0   Entered and Authorized by:   [name] DO   Signed by:   [name] DO on 02/15/2008   Method used:   Print then Give to Patient   RxID:   [id number]  ]

## 2010-08-11 NOTE — Assessment & Plan Note (Signed)
Summary: 1 MONTH FOLLOWUP///SPH   Vital Signs:  Patient profile:   55 year old female Height:      71 inches Weight:      216.6 pounds BMI:     30.32 Temp:     97.8 degrees F oral Pulse rate:   80 / minute Pulse rhythm:   regular BP sitting:   130 / 80  (left arm)  Vitals Entered By: Almeta Monas CMA Duncan Dull) (April 01, 2010 11:06 AM) CC: 1 mo f/u   History of Present Illness: pt here for labs only---she should not have been put in an ov  Current Medications (verified): 1)  Ambien 10 Mg Tabs (Zolpidem Tartrate) .... Take 1 Tablet By Mouth Once A Day 2)  Celexa 20 Mg Tabs (Citalopram Hydrobromide) .Marland Kitchen.. 1 By Mouth Once Daily 3)  Benicar 40 Mg Tabs (Olmesartan Medoxomil) .... Take 1 Tablet By Mouth Once A Day 4)  Loratadine 10 Mg Tabs (Loratadine) .... Take 1 Tablet By Mouth Once A Day 5)  Lasix 20 Mg Tabs (Furosemide) .... Take 2 Tablet By Mouth Once A Day 6)  Vivelle-Dot 0.1 Mg/24hr Pttw (Estradiol) .Marland Kitchen.. 1 Patch 2x A Week 7)  Crestor 20 Mg  Tabs (Rosuvastatin Calcium) .Marland Kitchen.. 1 By Mouth Once Daily 8)  Klor-Con M10 10 Meq  Tbcr (Potassium Chloride Crys Cr) .Marland Kitchen.. 1 By Mouth Once Daily 9)  Victoza 18 Mg/85ml Soln (Liraglutide) .... Use 1.8 Mg Daily 10)  Novafine 30 Gauge Needles .... Pt Test 3 X A Day 11)  Flexeril 10 Mg Tabs (Cyclobenzaprine Hcl) .Marland Kitchen.. 1 By Mouth Three Times A Day As Needed 12)  Vicodin 5-500 Mg Tabs (Hydrocodone-Acetaminophen) .Marland Kitchen.. 1 By Mouth Every 6 Hours As Needed 13)  Bd Pen Needle Mini U/f 31g X 5 Mm Misc (Insulin Pen Needle) .... As Directed 14)  Qvar 80 Mcg/act Aers (Beclomethasone Dipropionate) .... 2 Puffs Two Times A Day As Needed 15)  Vagifem 10 Mcg Tabs (Estradiol) .Marland Kitchen.. 1 Pv 2x A Week 16)  Amaryl 2 Mg Tabs (Glimepiride) .Marland Kitchen.. 1 By Mouth Daily. 17)  Accu-Chek Aviva  Strp (Glucose Blood) .... Test 3x Per Day As Directed 18)  Accu-Chek Aviva  Kit (Blood Glucose Monitoring Suppl) .... Use As Directed Daily 19)  Bariatric Advantage Crystals Multivitamin With  Calsium .... Once Daily  As Directed 20)  Cyanocobalamin 1000 Mcg/ml Soln (Cyanocobalamin) .Marland Kitchen.. 1 Ml Subcutaneously Weekly X4 Then 1x A Month 21)  Bd Eclipse Syringe 25g X 5/8" 3 Ml Misc (Syringe/needle (Disp)) .... As Directed  Allergies (verified): 1)  ! Septra 2)  ! Bactrim 3)  ! Macrodantin 4)  ! Morphine 5)  ! * Fentanyl 6)  ! Percocet 7)  Codeine Phosphate (Codeine Phosphate) 8)  * Inh 9)  * Ivp Dye 10)  Morphine Sulfate (Morphine Sulfate) 11)  Penicillin G Potassium (Penicillin G Potassium) 12)  Trimethoprim (Trimethoprim)   Complete Medication List: 1)  Ambien 10 Mg Tabs (Zolpidem tartrate) .... Take 1 tablet by mouth once a day 2)  Celexa 20 Mg Tabs (Citalopram hydrobromide) .Marland Kitchen.. 1 by mouth once daily 3)  Benicar 40 Mg Tabs (Olmesartan medoxomil) .... Take 1 tablet by mouth once a day 4)  Loratadine 10 Mg Tabs (Loratadine) .... Take 1 tablet by mouth once a day 5)  Lasix 20 Mg Tabs (Furosemide) .... Take 2 tablet by mouth once a day 6)  Vivelle-dot 0.1 Mg/24hr Pttw (Estradiol) .Marland Kitchen.. 1 patch 2x a week 7)  Crestor 20 Mg Tabs (Rosuvastatin calcium) .Marland KitchenMarland KitchenMarland Kitchen  1 by mouth once daily 8)  Klor-con M10 10 Meq Tbcr (Potassium chloride crys cr) .Marland Kitchen.. 1 by mouth once daily 9)  Victoza 18 Mg/71ml Soln (Liraglutide) .... Use 1.8 mg daily 10)  Novafine 30 Gauge Needles  .... Pt test 3 x a day 11)  Flexeril 10 Mg Tabs (Cyclobenzaprine hcl) .Marland Kitchen.. 1 by mouth three times a day as needed 12)  Vicodin 5-500 Mg Tabs (Hydrocodone-acetaminophen) .Marland Kitchen.. 1 by mouth every 6 hours as needed 13)  Bd Pen Needle Mini U/f 31g X 5 Mm Misc (Insulin pen needle) .... As directed 14)  Qvar 80 Mcg/act Aers (Beclomethasone dipropionate) .... 2 puffs two times a day as needed 15)  Vagifem 10 Mcg Tabs (Estradiol) .Marland Kitchen.. 1 pv 2x a week 16)  Amaryl 2 Mg Tabs (Glimepiride) .Marland Kitchen.. 1 by mouth daily. 17)  Accu-chek Aviva Strp (Glucose blood) .... Test 3x per day as directed 18)  Accu-chek Aviva Kit (Blood glucose monitoring suppl)  .... Use as directed daily 19)  Bariatric Advantage Crystals Multivitamin With Calsium  .... Once daily  as directed 20)  Cyanocobalamin 1000 Mcg/ml Soln (Cyanocobalamin) .Marland Kitchen.. 1 ml subcutaneously weekly x4 then 1x a month 21)  Bd Eclipse Syringe 25g X 5/8" 3 Ml Misc (Syringe/needle (disp)) .... As directed 22)  Avia Lancets  .... Accu check three times a day  Other Orders: Venipuncture ([address]) TLB-B12 + Folate Pnl ([id number]) Specimen Handling ([address]) No Charge Patient Arrived (NCPA0) (NCPA0) Prescriptions: AVIA LANCETS accu check three times a day  #3 months x 3   Entered and Authorized by:   [name] DO   Signed by:   [name] DO on 04/01/2010   Method used:   Faxed to ...       [facility]* (retail)       [address]       [address] Shipping/mailing       [place], [place]  [address]       Ph: [phone]       Fax: [phone]   RxID:   [id number] ACCU-CHEK AVIVA  KIT (BLOOD GLUCOSE MONITORING SUPPL) use as directed daily  #1 x 0   Entered and Authorized by:   [name] DO   Signed by:   [name] DO on 04/01/2010   Method used:   Electronically to        [facility]* (retail)       [address]       [address] Shipping/mailing       [place], [place]  [address]       Ph: [phone]       Fax: [phone]   RxID:   [id number] ACCU-CHEK AVIVA  STRP (GLUCOSE BLOOD) test 3x per day as directed  #100 x 11   Entered and Authorized by:   [name] DO   Signed by:   [name] DO on 04/01/2010   Method used:   Electronically to        [facility]* (retail)       [address]       [address] Shipping/mailing       [place], [place]  [address]       Ph: [phone]       Fax: [phone]   RxID:   [id number]

## 2010-08-11 NOTE — Progress Notes (Signed)
Summary: lowne--rx  Phone Note Call from Patient Call back at Home Phone [phone]   Caller: Patient Summary of Call: Patient is calling needing needles for the byetta pen and cbg for  lancet ,strips--for free style ---[facility] Initial call taken by: Freddy Jaksch,  July 04, 2008 11:26 AM  Follow-up for Phone Call        cALLED PT FOR CLARIFICATION PT SAYS SHE PICKED UP BYETTA, BUT HAD NO NEEDLES TO GO WITH IT, called in needles , lancets, strips to [facility] .Kandice Hams  July 04, 2008 2:07 PM  Follow-up by: Kandice Hams,  July 04, 2008 2:07 PM    New/Updated Medications: FREESTYLE LANCETS  MISC (LANCETS) PT TEST3 X A DAY * NOVAFINE 30 GAUGE NEEDLES PT TEST 3 X A DAY * FREESTYLE STRIPS PT TEST 3 X A DAY   Prescriptions: FREESTYLE STRIPS PT TEST 3 X A DAY  #100 x 3   Entered by:   Kandice Hams   Authorized by:   [name] DO   Signed by:   Kandice Hams on 07/04/2008   Method used:   Telephoned to ...       [facility] [address]* (retail)       [address]       [place], [place]  [address]       Ph: [phone] or [phone]       Fax: [phone]   RxID:   [id number] NOVAFINE 30 GAUGE NEEDLES PT TEST 3 X A DAY  #100 x 3   Entered by:   Kandice Hams   Authorized by:   [name] DO   Signed by:   Kandice Hams on 07/04/2008   Method used:   Telephoned to ...       [facility] [address]* (retail)       [address]       [place], [place]  [address]       Ph: [phone] or [phone]       Fax: [phone]   RxID:   [id number] FREESTYLE LANCETS  MISC (LANCETS) PT TEST3 X A DAY  #100 x 3   Entered by:   Kandice Hams   Authorized by:   [name] DO   Signed by:   Kandice Hams on 07/04/2008   Method used:   Telephoned to ...       [facility] [address] (retail)       [phone]       [place], [place]  [address]       Ph:  [phone]       Fax: [phone]   RxID:   [id number]

## 2010-08-11 NOTE — Letter (Signed)
Summary: Unable To Reach-Consult Scheduled  [facility] [place]  [address] [place], [place] [address]   Phone: [phone]  Fax: [phone]    07/16/2009 MRN: [id number]    Dear Ms. Widmayer,   We have been unable to reach you by phone.  Please contact our office with an updated phone number.      Thank you,  Army Fossa CMA  July 16, 2009 1:18 PM

## 2010-08-11 NOTE — Assessment & Plan Note (Signed)
Summary: BAD SINUS INFECTION/RH......   Vital Signs:  Patient profile:   55 year old female Weight:      256 pounds Temp:     100.1 degrees F oral BP sitting:   132 / 80  (left arm)  Vitals Entered By: Doristine Devoid (October 13, 2009 4:20 PM) CC: sinus infection and cough along w/ fever xsat.    History of Present Illness: 55 yo woman here today for sore throat, cough, fever.  Sxs started Saturday but pt reports 'i can't be sick.  I have a sleep study coming up and then lap band surgery'.  wants abx.  reports cough is productive of thick sputum.  temp to 101.  + sinus pressure and ear pain.  no known sick contacts.  taking Codeine cough syrup at home w/out difficulty (despite fact it is listed as allergy).  Current Medications (verified): 1)  Ambien 10 Mg Tabs (Zolpidem Tartrate) .... Take 1 Tablet By Mouth Once A Day 2)  Celexa 20 Mg Tabs (Citalopram Hydrobromide) .Marland Kitchen.. 1 By Mouth Once Daily 3)  Benicar 40 Mg Tabs (Olmesartan Medoxomil) .... Take 1 Tablet By Mouth Once A Day 4)  Prevacid 24hr 15 Mg Cpdr (Lansoprazole) .... Take 1 Tablet By Mouth Once A Day 5)  Loratadine 10 Mg Tabs (Loratadine) .... Take 1 Tablet By Mouth Once A Day 6)  Lasix 20 Mg Tabs (Furosemide) .... Take 2 Tablet By Mouth Once A Day 7)  Vivelle-Dot 0.1 Mg/24hr Pttw (Estradiol) .Marland Kitchen.. 1 Patch 2x A Week 8)  Crestor 20 Mg  Tabs (Rosuvastatin Calcium) .Marland Kitchen.. 1 By Mouth Once Daily 9)  Klor-Con M10 10 Meq  Tbcr (Potassium Chloride Crys Cr) .Marland Kitchen.. 1 By Mouth Once Daily 10)  Welchol 625 Mg  Tabs (Colesevelam Hcl) .... 6 Qd 11)  Victoza 18 Mg/6ml Soln (Liraglutide) .... Use 1.8 Mg Daily 12)  Freestyle Lancets  Misc (Lancets) .... Pt Test3 X A Day 13)  Novafine 30 Gauge Needles .... Pt Test 3 X A Day 14)  Freestyle Strips .... Pt Test 3 X A Day 15)  Mucinex Cold For Kids 2.5-100 Mg/80ml Liqd (Phenylephrine-Guaifenesin) .... Prn 16)  Flexeril 10 Mg Tabs (Cyclobenzaprine Hcl) .... Prn 17)  Vicodin 5-500 Mg Tabs  (Hydrocodone-Acetaminophen) .... Prn 18)  Lab Orders .Marland KitchenMarland Kitchen. 250.00 Hep, Lipid, Bmp, Hgba1c 19)  Vitamin D3 2000 Unit Caps (Cholecalciferol) .... Daily 20)  Bd Pen Needle Mini U/f 31g X 5 Mm Misc (Insulin Pen Needle) .... As Directed 21)  Metformin Hcl 500 Mg Xr24h-Tab (Metformin Hcl) .... 4 Qam 22)  Promethazine-Codeine 6.25-10 Mg/30ml Syrp (Promethazine-Codeine) .Marland Kitchen.. 1-2 Tsp By Mouth At Bedtime As Needed 23)  Qvar 80 Mcg/act Aers (Beclomethasone Dipropionate) .... 2 Puffs Two Times A Day As Needed 24)  Vagifem 10 Mcg Tabs (Estradiol) .Marland Kitchen.. 1 Pv 2x A Week 25)  Amaryl 2 Mg Tabs (Glimepiride) .Marland Kitchen.. 1 By Mouth Daily. 26)  Riomet 500 Mg/80ml Soln (Metformin Hcl) .... 2 Tsp Two Times A Day 27)  Levaquin 500 Mg Tabs (Levofloxacin) .... Take 1 Tab By Mouth Daily 28)  Cheratussin Ac 100-10 Mg/29ml Syrp (Guaifenesin-Codeine) .... 2 Tsps Q4-6 As Needed For Cough.  Disp  Allergies: 1)  ! Septra 2)  ! Bactrim 3)  ! Macrodantin 4)  ! Morphine 5)  ! * Fentanyl 6)  ! Percocet 7)  Codeine Phosphate (Codeine Phosphate) 8)  * Inh 9)  * Ivp Dye 10)  Morphine Sulfate (Morphine Sulfate) 11)  Penicillin G Potassium (Penicillin G  Potassium) 12)  Trimethoprim (Trimethoprim)  Past History:  Past Medical History: Allergic rhinitis Diabetes mellitus, type II Headache menopause gerd ibs Anxiety Depression Fibromyalgia Polyarthalgia  Review of Systems      See HPI  Physical Exam  General:  Well-developed,well-nourished,in no acute distress; alert,appropriate and cooperative throughout examination Head:  Normocephalic and atraumatic without obvious abnormalities. No apparent alopecia or balding.  no TTP over sinuses Eyes:  PERRL, EOMI, minimal injxn bilaterally Ears:  TMs retracted bilaterally Nose:  + congestion and turbinate edema Mouth:  + PND Neck:  No deformities, masses, or tenderness noted. Lungs:  CTAB but + hacking cough Heart:  Normal rate and regular rhythm. S1 and S2 normal without  gallop, murmur, click, rub or other extra sounds.   Impression & Recommendations:  Problem # 1:  BRONCHITIS, ACUTE (ICD-466.0) Assessment Unchanged given pt's multiple allergies and reported hx of complicated infxn requiring 3 rounds of abx will start pt on Levaquin in hopes pt is well prior to upcoming studies/surgeries.  Cough syrup as needed.  reviewed supportive care and red flags that should prompt return.  Pt expresses understanding and is in agreement w/ this plan. Her updated medication list for this problem includes:    Mucinex Cold For Kids 2.5-100 Mg/26ml Liqd (Phenylephrine-guaifenesin) .Marland Kitchen... Prn    Promethazine-codeine 6.25-10 Mg/79ml Syrp (Promethazine-codeine) .Marland Kitchen... 1-2 tsp by mouth at bedtime as needed    Qvar 80 Mcg/act Aers (Beclomethasone dipropionate) .Marland Kitchen... 2 puffs two times a day as needed    Levaquin 500 Mg Tabs (Levofloxacin) .Marland Kitchen... Take 1 tab by mouth daily    Cheratussin Ac 100-10 Mg/79ml Syrp (Guaifenesin-codeine) .Marland Kitchen... 2 tsps q4-6 as needed for cough.  disp  Complete Medication List: 1)  Ambien 10 Mg Tabs (Zolpidem tartrate) .... Take 1 tablet by mouth once a day 2)  Celexa 20 Mg Tabs (Citalopram hydrobromide) .Marland Kitchen.. 1 by mouth once daily 3)  Benicar 40 Mg Tabs (Olmesartan medoxomil) .... Take 1 tablet by mouth once a day 4)  Prevacid 24hr 15 Mg Cpdr (Lansoprazole) .... Take 1 tablet by mouth once a day 5)  Loratadine 10 Mg Tabs (Loratadine) .... Take 1 tablet by mouth once a day 6)  Lasix 20 Mg Tabs (Furosemide) .... Take 2 tablet by mouth once a day 7)  Vivelle-dot 0.1 Mg/24hr Pttw (Estradiol) .Marland Kitchen.. 1 patch 2x a week 8)  Crestor 20 Mg Tabs (Rosuvastatin calcium) .Marland Kitchen.. 1 by mouth once daily 9)  Klor-con M10 10 Meq Tbcr (Potassium chloride crys cr) .Marland Kitchen.. 1 by mouth once daily 10)  Welchol 625 Mg Tabs (Colesevelam hcl) .... 6 qd 11)  Victoza 18 Mg/78ml Soln (Liraglutide) .... Use 1.8 mg daily 12)  Freestyle Lancets Misc (Lancets) .... Pt test3 x a day 13)  Novafine  30 Gauge Needles  .... Pt test 3 x a day 14)  Freestyle Strips  .... Pt test 3 x a day 15)  Mucinex Cold For Kids 2.5-100 Mg/80ml Liqd (Phenylephrine-guaifenesin) .... Prn 16)  Flexeril 10 Mg Tabs (Cyclobenzaprine hcl) .... Prn 17)  Vicodin 5-500 Mg Tabs (Hydrocodone-acetaminophen) .... Prn 18)  Lab Orders  .Marland KitchenMarland Kitchen. 250.00 hep, lipid, bmp, hgba1c 19)  Vitamin D3 2000 Unit Caps (Cholecalciferol) .... Daily 20)  Bd Pen Needle Mini U/f 31g X 5 Mm Misc (Insulin pen needle) .... As directed 21)  Metformin Hcl 500 Mg Xr24h-tab (Metformin hcl) .... 4 qam 22)  Promethazine-codeine 6.25-10 Mg/51ml Syrp (Promethazine-codeine) .Marland Kitchen.. 1-2 tsp by mouth at bedtime as needed 23)  Qvar 80  Mcg/act Aers (Beclomethasone dipropionate) .... 2 puffs two times a day as needed 24)  Vagifem 10 Mcg Tabs (Estradiol) .Marland Kitchen.. 1 pv 2x a week 25)  Amaryl 2 Mg Tabs (Glimepiride) .Marland Kitchen.. 1 by mouth daily. 26)  Riomet 500 Mg/23ml Soln (Metformin hcl) .... 2 tsp two times a day 27)  Levaquin 500 Mg Tabs (Levofloxacin) .... Take 1 tab by mouth daily 28)  Cheratussin Ac 100-10 Mg/37ml Syrp (Guaifenesin-codeine) .... 2 tsps q4-6 as needed for cough.  disp  Patient Instructions: 1)  Follow up as needed 2)  Take the Levaquin as directed 3)  Use the cough syrup as directed- may cause drowsiness 4)  Coricidin HBP for nasal congestion (won't impact your blood pressure, use in place of Sudafed) 5)  Cough syrup has Mucinex in it- no need for extra 6)  Tylenol/Ibuprofen as needed 7)  Drink plenty of fluids 8)  Hang in there and good luck with surgery! Prescriptions: CHERATUSSIN AC 100-10 MG/5ML SYRP (GUAIFENESIN-CODEINE) 2 tsps Q4-6 as needed for cough.  disp  #150 x 0   Entered and Authorized by:   Neena Rhymes MD   Signed by:   Neena Rhymes MD on 10/13/2009   Method used:   Print then Give to Patient   RxID:   [id number] LEVAQUIN 500 MG TABS (LEVOFLOXACIN) Take 1 tab by mouth daily  #10 x 0   Entered and Authorized  by:   Neena Rhymes MD   Signed by:   Neena Rhymes MD on 10/13/2009   Method used:   Print then Give to Patient   RxID:   [id number]

## 2010-08-11 NOTE — Letter (Signed)
Summary: [facility]  [facility]   Imported By: Lanelle Bal 10/24/2008 09:39:41  _____________________________________________________________________  External Attachment:    Type:   Image     Comment:   External Document

## 2010-08-11 NOTE — Assessment & Plan Note (Signed)
Summary: rob--for muscle joint ache and pain//ph   Vital Signs:  Patient Profile:   55 Years Old Female Weight:      0.50 pounds Temp:     98.6 degrees F oral Pulse rate:   80 / minute Resp:     18 per minute BP sitting:   120 / 80  (right arm)  Vitals Entered By: Ardyth Man (June 13, 2008 2:33 PM)                 PCP:  Laury Axon  Chief Complaint:  aches all over and muscle joints ache.  History of Present Illness: Pt here c/o muscle ache and joints all over the body since 4 weeks after surgery.  Pt has been to Dr Venetia Maxon and appointment was made with DrDeveshwar.      Current Allergies: ! SEPTRA ! BACTRIM ! MACRODANTIN ! MORPHINE ! * FENTANYL ! PHENERGAN ! PERCOCET CODEINE PHOSPHATE (CODEINE PHOSPHATE) * INH * IVP DYE MORPHINE SULFATE (MORPHINE SULFATE) PENICILLIN G POTASSIUM (PENICILLIN G POTASSIUM) TRIMETHOPRIM (TRIMETHOPRIM)  Past Medical History:    Reviewed history from 06/09/2007 and no changes required:       Allergic rhinitis       Diabetes mellitus, type II       Headache       menopause       gerd       allergic rhinitis       ibs       Anxiety       Depression   Family History:    Reviewed history from 03/07/2007 and no changes required:       Family History Diabetes 1st degree relative (both parents)  Social History:    Reviewed history from 03/07/2007 and no changes required:       Married       Never Smoked       works in hospital    Review of Systems      See HPI   Physical Exam  General:     Well-developed,well-nourished,in no acute distress; alert,appropriate and cooperative throughout examination Psych:     Oriented X3, memory intact for recent and remote, normally interactive, good eye contact, not anxious appearing, not depressed appearing, and not agitated.      Impression & Recommendations:  Problem # 1:  MUSCLE PAIN (ICD-729.1)  Her updated medication list for this problem includes:    Mobic 15 Mg Tabs  (Meloxicam) .Marland Kitchen... Take 1 tablet by mouth once a day    Dilaudid 2 Mg Tabs (Hydromorphone hcl) .Marland Kitchen... 1-2 by mouth every 4 hours as needed pain  Orders: Venipuncture (16109) TLB-BMP (Basic Metabolic Panel-BMET) (80048-METABOL) TLB-CBC Platelet - w/Differential (85025-CBCD) TLB-Hepatic/Liver Function Pnl (80076-HEPATIC) TLB-TSH (Thyroid Stimulating Hormone) (84443-TSH) TLB-Cortisol (82533-CORT) TLB-Rheumatoid Factor (RA) (60454-UJ) TLB-Sedimentation Rate (ESR) (85651-ESR) T-Antinuclear Antib (ANA) [phone]) T-Vitamin D (25-Hydroxy) [phone]) Rheumatology Referral (Rheumatology)   Problem # 2:  PAIN IN JOINT, MULTIPLE SITES (ICD-719.49)  Orders: Venipuncture (84696) TLB-BMP (Basic Metabolic Panel-BMET) (80048-METABOL) TLB-CBC Platelet - w/Differential (85025-CBCD) TLB-Hepatic/Liver Function Pnl (80076-HEPATIC) TLB-TSH (Thyroid Stimulating Hormone) (84443-TSH) TLB-Cortisol (82533-CORT) TLB-Rheumatoid Factor (RA) (86430-RA) TLB-Sedimentation Rate (ESR) (85651-ESR) T-Antinuclear Antib (ANA) [phone]) T-Vitamin D (25-Hydroxy) (40102-72536) Rheumatology Referral (Rheumatology)   Complete Medication List: 1)  Mobic 15 Mg Tabs (Meloxicam) .... Take 1 tablet by mouth once a day 2)  Ambien 10 Mg Tabs (Zolpidem tartrate) .... Take 1 tablet by mouth once a day 3)  Nasonex 50 Mcg/act Susp (Mometasone furoate) .... Spray  2 spray into both nostrils every morning 4)  Celexa 20 Mg Tabs (Citalopram hydrobromide) .Marland Kitchen.. 1 by mouth once daily 5)  Benicar 40 Mg Tabs (Olmesartan medoxomil) .... Take 1 tablet by mouth once a day 6)  Prevacid 30 Mg Cpdr (Lansoprazole) .Marland Kitchen.. 1 capsule by mouth once a day 7)  Clarinex 5 Mg Tabs (Desloratadine) .... Take 1 tablet by mouth once a day 8)  Lasix 20 Mg Tabs (Furosemide) .... Take 2 tablet by mouth once a day 9)  Vivelle-dot 0.1 Mg/24hr Pttw (Estradiol) 10)  Crestor 20 Mg Tabs (Rosuvastatin calcium) .Marland Kitchen.. 1 by mouth once daily 11)  Riomet 500 Mg/61ml  Soln (Metformin hcl) .... 2 tsp by mouth two times a day 12)  Januvia 100 Mg Tabs (Sitagliptin phosphate) .... Qd 13)  Klor-con M10 10 Meq Tbcr (Potassium chloride crys cr) .Marland Kitchen.. 1 by mouth once daily 14)  Lyrica 75 Mg Caps (Pregabalin) .Marland Kitchen.. 1 by mouth two times a day 15)  Welchol 625 Mg Tabs (Colesevelam hcl) .... 6 qd 16)  Dilaudid 2 Mg Tabs (Hydromorphone hcl) .Marland Kitchen.. 1-2 by mouth every 4 hours as needed pain 17)  Valium 5 Mg Tabs (Diazepam) .Marland Kitchen.. 1 by mouth three times a day as needed    ]

## 2010-08-11 NOTE — Assessment & Plan Note (Signed)
Summary: ONE MTH FU FOR WEIGHT LOSS/NS/KDC   Vital Signs:  Patient profile:   55 year old female Weight:      255 pounds Temp:     98.8 degrees F oral Pulse rate:   86 / minute Pulse rhythm:   regular BP sitting:   136 / 84  (left arm) Cuff size:   large  Vitals Entered By: Army Fossa CMA (May 08, 2009 1:30 PM) CC: one month follow up for weight loss , discuss refill on vitamin d.    History of Present Illness: Pt here for weight check and f/u last visit.  Pt feeling much better but thinks she is being exposed to something in house because that is when everything else started.  No other complaints.    Current Medications (verified): 1)  Ambien 10 Mg Tabs (Zolpidem Tartrate) .... Take 1 Tablet By Mouth Once A Day 2)  Celexa 20 Mg Tabs (Citalopram Hydrobromide) .Marland Kitchen.. 1 By Mouth Once Daily 3)  Benicar 40 Mg Tabs (Olmesartan Medoxomil) .... Take 1 Tablet By Mouth Once A Day 4)  Prevacid 24hr 15 Mg Cpdr (Lansoprazole) .... Take 1 Tablet By Mouth Once A Day 5)  Loratadine 10 Mg Tabs (Loratadine) .... Take 1 Tablet By Mouth Once A Day 6)  Lasix 20 Mg Tabs (Furosemide) .... Take 2 Tablet By Mouth Once A Day 7)  Vivelle-Dot 0.1 Mg/24hr Pttw (Estradiol) .Marland Kitchen.. 1 Patch 2x A Week 8)  Crestor 20 Mg  Tabs (Rosuvastatin Calcium) .Marland Kitchen.. 1 By Mouth Once Daily 9)  Klor-Con M10 10 Meq  Tbcr (Potassium Chloride Crys Cr) .Marland Kitchen.. 1 By Mouth Once Daily 10)  Welchol 625 Mg  Tabs (Colesevelam Hcl) .... 6 Qd 11)  Vitamin D 56213 Unit Caps (Ergocalciferol) .... Take 1 Tab Weekly 12)  Victoza 18 Mg/77ml Soln (Liraglutide) .... Use 1.8 Mg Daily 13)  Freestyle Lancets  Misc (Lancets) .... Pt Test3 X A Day 14)  Novafine 30 Gauge Needles .... Pt Test 3 X A Day 15)  Freestyle Strips .... Pt Test 3 X A Day 16)  Prednisone 2mg  .... Take 1 Tablet By Mouth Once A Day 17)  Mucinex Cold For Kids 2.5-100 Mg/70ml Liqd (Phenylephrine-Guaifenesin) .... Prn 18)  Flexeril 10 Mg Tabs (Cyclobenzaprine Hcl) .... Prn 19)   Vicodin 5-500 Mg Tabs (Hydrocodone-Acetaminophen) .... Prn 20)  Lab Orders .Marland KitchenMarland Kitchen. 250.00 Hep, Lipid, Bmp, Hgba1c 21)  Vitamin D3 2000 Unit Caps (Cholecalciferol) .... Daily 22)  Bd Pen Needle Mini U/f 31g X 5 Mm Misc (Insulin Pen Needle) .... As Directed 23)  Metformin Hcl 500 Mg Tabs (Metformin Hcl) .... 2 Tabs Two Times A Day 24)  Metformin Hcl 500 Mg Xr24h-Tab (Metformin Hcl) .... 4 Qam 25)  Levsin/sl 0.125 Mg Subl (Hyoscyamine Sulfate) .Marland Kitchen.. 1-2 Sl Q4h As Needed Diarrhea, Cramping 26)  Levaquin 750 Mg  Tabs (Levofloxacin) .... One Tablet By Mouth Daily 27)  Avelox 400 Mg Tabs (Moxifloxacin Hcl) .Marland Kitchen.. 1 By Mouth Once Daily 28)  Promethazine-Codeine 6.25-10 Mg/58ml Syrp (Promethazine-Codeine) .Marland Kitchen.. 1-2 Tsp By Mouth At Bedtime As Needed  Allergies: 1)  ! Septra 2)  ! Bactrim 3)  ! Macrodantin 4)  ! Morphine 5)  ! * Fentanyl 6)  ! Percocet 7)  Codeine Phosphate (Codeine Phosphate) 8)  * Inh 9)  * Ivp Dye 10)  Morphine Sulfate (Morphine Sulfate) 11)  Penicillin G Potassium (Penicillin G Potassium) 12)  Trimethoprim (Trimethoprim)  Past History:  Past medical, surgical, family and social histories (including risk factors)  reviewed, and no changes noted (except as noted below). Past medical, surgical, family and social histories (including risk factors) reviewed for relevance to current acute and chronic problems.  Past Medical History: Reviewed history from 11/04/2008 and no changes required. Allergic rhinitis Diabetes mellitus, type II Headache menopause gerd allergic rhinitis ibs Anxiety Depression Fibromyalgia Polyarthalgia  Past Surgical History: Reviewed history from 11/04/2008 and no changes required. Caesarean section Cervical laminectomy Cholecystectomy GYN surgery Hysterectomy Tubal ligation Tonsillectomy Foot surgery Rt Knee Sinus surgery  Family History: Reviewed history from 11/04/2008 and no changes required. Family History Diabetes 1st degree relative  (both parents) Mother, D, Stroke, Breast CA, A-fib, HTN, Diabetes Father,D, Stroke, HTn, Diabetes, Thyroid, Heart disese  Social History: Reviewed history from 11/04/2008 and no changes required. 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  Otoscopic examination reveals clear canals, tympanic membranes are intact bilaterally without bulging, retraction, inflammation or discharge. Hearing is grossly normal bilaterally. Nose:  External nasal examination shows no deformity or inflammation. Nasal mucosa are pink and moist without lesions or exudates. Mouth:  Oral mucosa and oropharynx without lesions or exudates.  Teeth in good repair. Neck:  No deformities, masses, or tenderness noted. Lungs:  Normal respiratory effort, chest expands symmetrically. Lungs are clear to auscultation, no crackles or wheezes. Heart:  Normal rate and regular rhythm. S1 and S2 normal without gallop, murmur, click, rub or other extra sounds. Neurologic:  No cranial nerve deficits noted. Station and gait are normal. Plantar reflexes are down-going bilaterally. DTRs are symmetrical throughout. Sensory, motor and coordinative functions appear intact. Skin:  Intact without suspicious lesions or rashes Psych:  Cognition and judgment appear intact. Alert and cooperative with normal attention span and concentration. No apparent delusions, illusions, hallucinations   Impression & Recommendations:  Problem # 1:  MORBID OBESITY (ICD-278.01) con't diet and exercise Ht: 71 (04/16/2009)   Wt: 255 (05/08/2009)   BMI: 36.04 (04/16/2009)  Problem # 2:  ACUTE PHARYNGITIS (ICD-462)  The following medications were removed from the medication list:    Levaquin 750 Mg Tabs  (Levofloxacin) ..... One tablet by mouth daily Her updated medication list for this problem includes:    Avelox 400 Mg Tabs (Moxifloxacin hcl) .Marland Kitchen... 1 by mouth once daily  Problem # 3:  BRONCHITIS, ACUTE (ICD-466.0)  The following medications were removed from the medication list:    Levaquin 750 Mg Tabs (Levofloxacin) ..... One tablet by mouth daily Her updated medication list for this problem includes:    Mucinex Cold For Kids 2.5-100 Mg/41ml Liqd (Phenylephrine-guaifenesin) .Marland Kitchen... Prn    Avelox 400 Mg Tabs (Moxifloxacin hcl) .Marland Kitchen... 1 by mouth once daily    Promethazine-codeine 6.25-10 Mg/43ml Syrp (Promethazine-codeine) .Marland Kitchen... 1-2 tsp by mouth at bedtime as needed    Qvar 80 Mcg/act Aers (Beclomethasone dipropionate) .Marland Kitchen... 2 puffs two times a day  Take antibiotics and other medications as directed. Encouraged to push clear liquids, get enough rest, and take acetaminophen as needed. To be seen in 5-7 days if no improvement, sooner if worse.  Complete Medication List: 1)  Ambien 10 Mg Tabs (Zolpidem tartrate) .... Take 1 tablet by mouth once a day 2)  Celexa 20 Mg Tabs (Citalopram hydrobromide) .Marland Kitchen.. 1 by mouth once daily 3)  Benicar 40 Mg Tabs (  Olmesartan medoxomil) .... Take 1 tablet by mouth once a day 4)  Prevacid 24hr 15 Mg Cpdr (Lansoprazole) .... Take 1 tablet by mouth once a day 5)  Loratadine 10 Mg Tabs (Loratadine) .... Take 1 tablet by mouth once a day 6)  Lasix 20 Mg Tabs (Furosemide) .... Take 2 tablet by mouth once a day 7)  Vivelle-dot 0.1 Mg/24hr Pttw (Estradiol) .Marland Kitchen.. 1 patch 2x a week 8)  Crestor 20 Mg Tabs (Rosuvastatin calcium) .Marland Kitchen.. 1 by mouth once daily 9)  Klor-con M10 10 Meq Tbcr (Potassium chloride crys cr) .Marland Kitchen.. 1 by mouth once daily 10)  Welchol 625 Mg Tabs (Colesevelam hcl) .... 6 qd 11)  Vitamin D 16109 Unit Caps (Ergocalciferol) .... Take 1 tab weekly 12)  Victoza 18 Mg/28ml Soln (Liraglutide) .... Use 1.8 mg daily 13)  Freestyle Lancets Misc (Lancets) .... Pt test3  x a day 14)  Novafine 30 Gauge Needles  .... Pt test 3 x a day 15)  Freestyle Strips  .... Pt test 3 x a day 16)  Prednisone 2mg   .... Take 1 tablet by mouth once a day 17)  Mucinex Cold For Kids 2.5-100 Mg/52ml Liqd (Phenylephrine-guaifenesin) .... Prn 18)  Flexeril 10 Mg Tabs (Cyclobenzaprine hcl) .... Prn 19)  Vicodin 5-500 Mg Tabs (Hydrocodone-acetaminophen) .... Prn 20)  Lab Orders  .Marland KitchenMarland Kitchen. 250.00 hep, lipid, bmp, hgba1c 21)  Vitamin D3 2000 Unit Caps (Cholecalciferol) .... Daily 22)  Bd Pen Needle Mini U/f 31g X 5 Mm Misc (Insulin pen needle) .... As directed 23)  Metformin Hcl 500 Mg Tabs (Metformin hcl) .... 2 tabs two times a day 24)  Metformin Hcl 500 Mg Xr24h-tab (Metformin hcl) .... 4 qam 25)  Levsin/sl 0.125 Mg Subl (Hyoscyamine sulfate) .Marland Kitchen.. 1-2 sl q4h as needed diarrhea, cramping 26)  Avelox 400 Mg Tabs (Moxifloxacin hcl) .Marland Kitchen.. 1 by mouth once daily 27)  Promethazine-codeine 6.25-10 Mg/48ml Syrp (Promethazine-codeine) .Marland Kitchen.. 1-2 tsp by mouth at bedtime as needed 28)  Qvar 80 Mcg/act Aers (Beclomethasone dipropionate) .... 2 puffs two times a day

## 2010-08-11 NOTE — Letter (Signed)
Summary: Request for leave of Absence form  Request for leave of Absence form   Imported By: Freddy Jaksch 09/26/2007 11:21:09  _____________________________________________________________________  External Attachment:    Type:   Image     Comment:   External Document

## 2010-08-11 NOTE — Consult Note (Signed)
Summary: [facility]  [facility]   Imported By: Lanelle Bal 03/21/2008 11:05:47  _____________________________________________________________________  External Attachment:    Type:   Image     Comment:   External Document

## 2010-08-11 NOTE — Letter (Signed)
Summary: [facility]  [facility]   Imported By: Lanelle Bal 12/19/2009 12:18:36  _____________________________________________________________________  External Attachment:    Type:   Image     Comment:   External Document

## 2010-08-11 NOTE — Progress Notes (Signed)
Summary: NEEDS ANOTHER PRESCRIPTION FOR [facility] W / CODEINE  Phone Note Call from Patient Call back at [phone]   Caller: Patient Summary of Call: PATIENT SAYS SHE NEEDS TO PICK UP ANOTHER PRESCRIPTION OF PHENERGAN WITH CODEINE---SAYS SHE IS ONLY USING IT AT NIGHT, BUT SHE IS STARTING TO FEEL THE CONGESTION BUILDING UP AGAIN  SAYS SHE IS DRINKING OVER 64 OZ OF LIQUID A DAY  (MAY ALSO HAVE A HANDICAP STICKER RENEWAL FORM TO PICK UP---SEE OTHER PHONE NOTE) Initial call taken by: Jerolyn Shin,  May 15, 2009 5:15 PM  Follow-up for Phone Call        refill x1 Follow-up by: [name] DO,  May 15, 2009 10:28 PM  Additional Follow-up for Phone Call Additional follow up Details #1::        refill done, put up front for her to pick up.  Additional Follow-up by: Army Fossa CMA,  May 16, 2009 8:17 AM    Prescriptions: PROMETHAZINE-CODEINE 6.25-10 MG/5ML SYRP (PROMETHAZINE-CODEINE) 1-2 tsp by mouth at bedtime as needed  #6 oz x 0   Entered by:   Army Fossa CMA   Authorized by:   [name] DO   Signed by:   Army Fossa CMA on 05/16/2009   Method used:   Print then Give to Patient   RxID:   [id number]

## 2010-08-11 NOTE — Assessment & Plan Note (Signed)
Summary: tick bite/mu   Vital Signs:  Patient profile:   55 year old female Height:      71 inches Weight:      256 pounds BMI:     35.83 Temp:     98.6 degrees F oral Pulse rate:   78 / minute Resp:     12 per minute BP sitting:   130 / 70  (left arm)  Vitals Entered By: Ardyth Man (Nov 13, 2008 2:27 PM) CC: tick bite Is Patient Diabetic? No Pain Assessment Patient in pain? no       Have you ever been in a relationship where you felt threatened, hurt or afraid?No   Does patient need assistance? Functional Status Self care Ambulation Normal   History of Present Illness: Pt here because of a tick bite this weekend and fatigue.  She now has papules on her arm as well.    Problems Prior to Update: 1)  Upper Respiratory Infection, Acute  (ICD-465.9) 2)  Fibromyalgia  (ICD-729.1) 3)  Polyarthralgia  (ICD-719.49) 4)  Other Specified Anemias  (ICD-285.8) 5)  Unspecified Vitamin D Deficiency  (ICD-268.9) 6)  Pain in Joint, Multiple Sites  (ICD-719.49) 7)  Muscle Pain  (ICD-729.1) 8)  Back Pain, Lumbar  (ICD-724.2) 9)  Acute Sinusitis, Unspecified  (ICD-461.9) 10)  Cellulitis  (ICD-682.9) 11)  Depression  (ICD-311) 12)  Anxiety  (ICD-300.00) 13)  Family History Diabetes 1st Degree Relative  (ICD-V18.0) 14)  Hyperlipidemia Nec/nos  (ICD-272.4) 15)  Hypertension, Essential Nos  (ICD-401.9) 16)  Headache  (ICD-784.0) 17)  Diabetes Mellitus, Type II  (ICD-250.00) 18)  Allergic Rhinitis  (ICD-477.9)  Medications Prior to Update: 1)  Ambien 10 Mg Tabs (Zolpidem Tartrate) .... Take 1 Tablet By Mouth Once A Day 2)  Celexa 20 Mg Tabs (Citalopram Hydrobromide) .Marland Kitchen.. 1 By Mouth Once Daily 3)  Benicar 40 Mg Tabs (Olmesartan Medoxomil) .... Take 1 Tablet By Mouth Once A Day 4)  Prevacid 30 Mg Cpdr (Lansoprazole) .Marland Kitchen.. 1 Capsule By Mouth Once A Day 5)  Clarinex 5 Mg Tabs (Desloratadine) .... Take 1 Tablet By Mouth Once A Day 6)  Lasix 20 Mg Tabs (Furosemide) .... Take 2 Tablet  By Mouth Once A Day 7)  Vivelle-Dot 0.1 Mg/24hr Pttw (Estradiol) 8)  Crestor 20 Mg  Tabs (Rosuvastatin Calcium) .Marland Kitchen.. 1 By Mouth Once Daily 9)  Riomet 500 Mg/69ml Soln (Metformin Hcl) .... 2 Tsp By Mouth Two Times A Day 10)  Januvia 100 Mg  Tabs (Sitagliptin Phosphate) .... Qd 11)  Klor-Con M10 10 Meq  Tbcr (Potassium Chloride Crys Cr) .Marland Kitchen.. 1 By Mouth Once Daily 12)  Welchol 625 Mg  Tabs (Colesevelam Hcl) .... 6 Qd 13)  Vitamin D 46270 Unit Caps (Ergocalciferol) .... Take 1 Tab Weekly 14)  Byetta 10 Mcg Pen 10 Mcg/0.27ml Soln (Exenatide) .Marland Kitchen.. 10 Micrograms Subcutaneously Bid 15)  Freestyle Lancets  Misc (Lancets) .... Pt Test3 X A Day 16)  Novafine 30 Gauge Needles .... Pt Test 3 X A Day 17)  Freestyle Strips .... Pt Test 3 X A Day 18)  Prednisone 2.5 Mg Tabs (Prednisone) .Marland Kitchen.. 1 Tid 19)  Mucinex Cold For Kids 2.5-100 Mg/66ml Liqd (Phenylephrine-Guaifenesin) .... Prn 20)  Flexeril 10 Mg Tabs (Cyclobenzaprine Hcl) .... Prn 21)  Vicodin 5-500 Mg Tabs (Hydrocodone-Acetaminophen) .... Prn 22)  Lab Orders .Marland KitchenMarland Kitchen. 250.00 Hep, Lipid, Bmp, Hgba1c  Current Medications (verified): 1)  Ambien 10 Mg Tabs (Zolpidem Tartrate) .... Take 1 Tablet By Mouth Once A Day  2)  Celexa 20 Mg Tabs (Citalopram Hydrobromide) .Marland Kitchen.. 1 By Mouth Once Daily 3)  Benicar 40 Mg Tabs (Olmesartan Medoxomil) .... Take 1 Tablet By Mouth Once A Day 4)  Prevacid 30 Mg Cpdr (Lansoprazole) .Marland Kitchen.. 1 Capsule By Mouth Once A Day 5)  Clarinex 5 Mg Tabs (Desloratadine) .... Take 1 Tablet By Mouth Once A Day 6)  Lasix 20 Mg Tabs (Furosemide) .... Take 2 Tablet By Mouth Once A Day 7)  Vivelle-Dot 0.1 Mg/24hr Pttw (Estradiol) 8)  Crestor 20 Mg  Tabs (Rosuvastatin Calcium) .Marland Kitchen.. 1 By Mouth Once Daily 9)  Riomet 500 Mg/63ml Soln (Metformin Hcl) .... 2 Tsp By Mouth Two Times A Day 10)  Januvia 100 Mg  Tabs (Sitagliptin Phosphate) .... Qd 11)  Klor-Con M10 10 Meq  Tbcr (Potassium Chloride Crys Cr) .Marland Kitchen.. 1 By Mouth Once Daily 12)  Welchol 625 Mg  Tabs  (Colesevelam Hcl) .... 6 Qd 13)  Vitamin D [address] Unit Caps (Ergocalciferol) .... Take 1 Tab Weekly 14)  Byetta 10 Mcg Pen 10 Mcg/0.42ml Soln (Exenatide) .Marland Kitchen.. 10 Micrograms Subcutaneously Bid 15)  Freestyle Lancets  Misc (Lancets) .... Pt Test3 X A Day 16)  Novafine 30 Gauge Needles .... Pt Test 3 X A Day 17)  Freestyle Strips .... Pt Test 3 X A Day 18)  Prednisone 2.5 Mg Tabs (Prednisone) .Marland Kitchen.. 1 Tid 19)  Mucinex Cold For Kids 2.5-100 Mg/13ml Liqd (Phenylephrine-Guaifenesin) .... Prn 20)  Flexeril 10 Mg Tabs (Cyclobenzaprine Hcl) .... Prn 21)  Vicodin 5-500 Mg Tabs (Hydrocodone-Acetaminophen) .... Prn 22)  Lab Orders .Marland KitchenMarland Kitchen. 250.00 Hep, Lipid, Bmp, Hgba1c 23)  Doxycycline Hyclate 100 Mg Tabs (Doxycycline Hyclate) .Marland Kitchen.. 1 By Mouth Two Times A Day  Allergies (verified): 1)  ! Septra 2)  ! Bactrim 3)  ! Macrodantin 4)  ! Morphine 5)  ! * Fentanyl 6)  ! Phenergan 7)  ! Percocet 8)  Codeine Phosphate (Codeine Phosphate) 9)  * Inh 10)  * Ivp Dye 11)  Morphine Sulfate (Morphine Sulfate) 12)  Penicillin G Potassium (Penicillin G Potassium) 13)  Trimethoprim (Trimethoprim)  Past History:  Family History:    Family History Diabetes 1st degree relative (both parents)    Mother, D, Stroke, Breast CA, A-fib, HTN, Diabetes    Father,D, Stroke, HTn, Diabetes, Thyroid, Heart disese (11/04/2008)  Past medical, surgical, family and social histories (including risk factors) reviewed, and no changes noted (except as noted below).  Past Medical History:    Reviewed history from 11/04/2008 and no changes required:    Allergic rhinitis    Diabetes mellitus, type II    Headache    menopause    gerd    allergic rhinitis    ibs    Anxiety    Depression    Fibromyalgia    Polyarthalgia  Past Surgical History:    Reviewed history from 11/04/2008 and no changes required:    Caesarean section    Cervical laminectomy    Cholecystectomy    GYN surgery    Hysterectomy    Tubal ligation     Tonsillectomy    Foot surgery    Rt Knee    Sinus surgery  Family History:    Reviewed history from 11/04/2008 and no changes required:       Family History Diabetes 1st degree relative (both parents)       Mother, D, Stroke, Breast CA, A-fib, HTN, Diabetes       Father,D, Stroke, HTn, Diabetes, Thyroid, Heart disese  Social  History:    Reviewed history from 11/04/2008 and no changes required:       Married       Never Smoked       works in hospital       No ETOH       RN @ [facility]  Review of Systems      See HPI  Physical Exam  General:  Well-developed,well-nourished,in no acute distress; alert,appropriate and cooperative throughout examination Neck:  No deformities, masses, or tenderness noted. Lungs:  Normal respiratory effort, chest expands symmetrically. Lungs are clear to auscultation, no crackles or wheezes. Heart:  Normal rate and regular rhythm. S1 and S2 normal without gallop, murmur, click, rub or other extra sounds. Skin:  papules R arm  + tick bite R hip Cervical Nodes:  No lymphadenopathy noted Psych:  Cognition and judgment appear intact. Alert and cooperative with normal attention span and concentration. No apparent delusions, illusions, hallucinations   Impression & Recommendations:  Problem # 1:  TICK BITE (ICD-E906.4) doxycycline 100 mg two times a day for 10 days  Orders: Venipuncture (25956) TLB-CBC Platelet - w/Differential (85025-CBCD) T-Lyme Disease (38756-43329) T- * Misc. Laboratory test [phone])  Complete Medication List: 1)  Ambien 10 Mg Tabs (Zolpidem tartrate) .... Take 1 tablet by mouth once a day 2)  Celexa 20 Mg Tabs (Citalopram hydrobromide) .Marland Kitchen.. 1 by mouth once daily 3)  Benicar 40 Mg Tabs (Olmesartan medoxomil) .... Take 1 tablet by mouth once a day 4)  Prevacid 30 Mg Cpdr (Lansoprazole) .Marland Kitchen.. 1 capsule by mouth once a day 5)  Clarinex 5 Mg Tabs (Desloratadine) .... Take 1 tablet by mouth once a day 6)  Lasix 20 Mg Tabs  (Furosemide) .... Take 2 tablet by mouth once a day 7)  Vivelle-dot 0.1 Mg/24hr Pttw (Estradiol) 8)  Crestor 20 Mg Tabs (Rosuvastatin calcium) .Marland Kitchen.. 1 by mouth once daily 9)  Riomet 500 Mg/64ml Soln (Metformin hcl) .... 2 tsp by mouth two times a day 10)  Januvia 100 Mg Tabs (Sitagliptin phosphate) .... Qd 11)  Klor-con M10 10 Meq Tbcr (Potassium chloride crys cr) .Marland Kitchen.. 1 by mouth once daily 12)  Welchol 625 Mg Tabs (Colesevelam hcl) .... 6 qd 13)  Vitamin D 16606 Unit Caps (Ergocalciferol) .... Take 1 tab weekly 14)  Byetta 10 Mcg Pen 10 Mcg/0.21ml Soln (Exenatide) .Marland Kitchen.. 10 micrograms subcutaneously bid 15)  Freestyle Lancets Misc (Lancets) .... Pt test3 x a day 16)  Novafine 30 Gauge Needles  .... Pt test 3 x a day 17)  Freestyle Strips  .... Pt test 3 x a day 18)  Prednisone 2.5 Mg Tabs (Prednisone) .Marland Kitchen.. 1 tid 19)  Mucinex Cold For Kids 2.5-100 Mg/86ml Liqd (Phenylephrine-guaifenesin) .... Prn 20)  Flexeril 10 Mg Tabs (Cyclobenzaprine hcl) .... Prn 21)  Vicodin 5-500 Mg Tabs (Hydrocodone-acetaminophen) .... Prn 22)  Lab Orders  .Marland KitchenMarland Kitchen. 250.00 hep, lipid, bmp, hgba1c 23)  Doxycycline Hyclate 100 Mg Tabs (Doxycycline hyclate) .Marland Kitchen.. 1 by mouth two times a day Prescriptions: DOXYCYCLINE HYCLATE 100 MG TABS (DOXYCYCLINE HYCLATE) 1 by mouth two times a day  #20 x 0   Entered and Authorized by:   [name] DO   Signed by:   [name] DO on 11/13/2008   Method used:   Electronically to        [facility] (retail)       [address]       [place]  [place], [place]  [address]       Ph: [phone] or [phone]       Fax: [phone]   RxID:   [id number]

## 2010-08-11 NOTE — Assessment & Plan Note (Signed)
Summary: cpx/cbs   Vital Signs:  Patient profile:   55 year old female Height:      71 inches Weight:      256 pounds Temp:     98.1 degrees F oral Pulse rate:   74 / minute Resp:     16 per minute BP sitting:   130 / 80  (left arm)  Vitals Entered By: Ardyth Man (January 02, 2009 1:09 PM) CC: CPX/No Pap/Non Fasting Is Patient Diabetic? Yes  Pain Assessment Patient in pain? no       Have you ever been in a relationship where you felt threatened, hurt or afraid?No   Does patient need assistance? Functional Status Self care Ambulation Normal   History of Present Illness: Pt here for cpe --- no pap,  Pt sees Dr Stefano Gaul.    Pt also here to discuss weight and diet.    Preventive Screening-Counseling & Management  Alcohol-Tobacco     Alcohol drinks/day: 0     Smoking Status: never  Caffeine-Diet-Exercise     Caffeine use/day: 1     Does Patient Exercise: yes     Type of exercise: gym  Hep-HIV-STD-Contraception     Dental Visit-last 6 months yes     Dental Care Counseling: not indicated; dental care within six months     SBE monthly: yes     Sun Exposure-Excessive: no  Safety-Violence-Falls     Seat Belt Use: yes     Firearms in the Home: no firearms in the home     Smoke Detectors: yes     Violence in the Home: no risk noted     Sexual Abuse: no      Sexual History:  currently monogamous.        Drug Use:  never.        Blood Transfusions:  no.    Current Medications (verified): 1)  Ambien 10 Mg Tabs (Zolpidem Tartrate) .... Take 1 Tablet By Mouth Once A Day 2)  Celexa 20 Mg Tabs (Citalopram Hydrobromide) .Marland Kitchen.. 1 By Mouth Once Daily 3)  Benicar 40 Mg Tabs (Olmesartan Medoxomil) .... Take 1 Tablet By Mouth Once A Day 4)  Prevacid 30 Mg Cpdr (Lansoprazole) .Marland Kitchen.. 1 Capsule By Mouth Once A Day 5)  Clarinex 5 Mg Tabs (Desloratadine) .... Take 1 Tablet By Mouth Once A Day 6)  Lasix 20 Mg Tabs (Furosemide) .... Take 2 Tablet By Mouth Once A Day 7)  Vivelle-Dot  0.1 Mg/24hr Pttw (Estradiol) 8)  Crestor 20 Mg  Tabs (Rosuvastatin Calcium) .Marland Kitchen.. 1 By Mouth Once Daily 9)  Riomet 500 Mg/34ml Soln (Metformin Hcl) .... 2 Tsp By Mouth Two Times A Day 10)  Janumet 50-500 Mg Tabs (Sitagliptin-Metformin Hcl) .... Take One Tablet By Mouth Two Times Daily. 11)  Klor-Con M10 10 Meq  Tbcr (Potassium Chloride Crys Cr) .Marland Kitchen.. 1 By Mouth Once Daily 12)  Welchol 625 Mg  Tabs (Colesevelam Hcl) .... 6 Qd 13)  Vitamin D 04540 Unit Caps (Ergocalciferol) .... Take 1 Tab Weekly 14)  Victoza 18 Mg/46ml Soln (Liraglutide) .... 0.6 Ml Daily 15)  Freestyle Lancets  Misc (Lancets) .... Pt Test3 X A Day 16)  Novafine 30 Gauge Needles .... Pt Test 3 X A Day 17)  Freestyle Strips .... Pt Test 3 X A Day 18)  Prednisone 2.5 Mg Tabs (Prednisone) .Marland Kitchen.. 1 Tid 19)  Mucinex Cold For Kids 2.5-100 Mg/70ml Liqd (Phenylephrine-Guaifenesin) .... Prn 20)  Flexeril 10 Mg Tabs (Cyclobenzaprine Hcl) .... Prn  21)  Vicodin 5-500 Mg Tabs (Hydrocodone-Acetaminophen) .... Prn 22)  Lab Orders .Marland KitchenMarland Kitchen. 250.00 Hep, Lipid, Bmp, Hgba1c 23)  Vitamin D3 2000 Unit Caps (Cholecalciferol) .... Daily 24)  Humalog Kwikpen 100 Unit/ml Soln (Insulin Lispro (Human)) .... Sliding Scale  Allergies (verified): 1)  ! Septra 2)  ! Bactrim 3)  ! Macrodantin 4)  ! Morphine 5)  ! * Fentanyl 6)  ! Phenergan 7)  ! Percocet 8)  Codeine Phosphate (Codeine Phosphate) 9)  * Inh 10)  * Ivp Dye 11)  Morphine Sulfate (Morphine Sulfate) 12)  Penicillin G Potassium (Penicillin G Potassium) 13)  Trimethoprim (Trimethoprim)  Past History:  Past medical, surgical, family and social histories (including risk factors) reviewed, and no changes noted (except as noted below).  Past Medical History: Reviewed history from 11/04/2008 and no changes required. Allergic rhinitis Diabetes mellitus, type II Headache menopause gerd allergic rhinitis ibs Anxiety Depression Fibromyalgia Polyarthalgia  Past Surgical History: Reviewed  history from 11/04/2008 and no changes required. Caesarean section Cervical laminectomy Cholecystectomy GYN surgery Hysterectomy Tubal ligation Tonsillectomy Foot surgery Rt Knee Sinus surgery  Family History: Reviewed history from 11/04/2008 and no changes required. Family History Diabetes 1st degree relative (both parents) Mother, D, Stroke, Breast CA, A-fib, HTN, Diabetes Father,D, Stroke, HTn, Diabetes, Thyroid, Heart disese  Social History: Reviewed history from 11/04/2008 and no changes required. 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 Eyes:  Denies blurring, discharge, double vision, eye irritation, eye pain, halos, itching, light sensitivity, red eye, vision loss-1 eye, and vision loss-both eyes; optho-- annual---- Pearlean Brownie . ENT:  Denies decreased hearing, difficulty swallowing, ear discharge, earache, hoarseness, nasal congestion, nosebleeds, postnasal drainage, ringing in ears, sinus pressure, and sore throat. CV:  Denies bluish discoloration of lips or nails, chest pain or discomfort, difficulty breathing at night, difficulty breathing while lying down, fainting, fatigue, leg cramps with exertion, lightheadness, near fainting, palpitations, shortness of breath with exertion, swelling of feet, swelling of hands, and weight gain. Resp:  Denies chest discomfort, chest pain with inspiration, cough, coughing up blood, excessive snoring, hypersomnolence, morning headaches, pleuritic, shortness of breath, sputum productive, and wheezing. GI:  Denies abdominal pain, bloody stools, change in bowel  habits, constipation, dark tarry stools, diarrhea, excessive appetite, gas, hemorrhoids, indigestion, loss of appetite, nausea, vomiting, vomiting blood, and yellowish skin color. GU:  Denies abnormal vaginal bleeding, decreased libido, discharge, dysuria, genital sores, hematuria, incontinence, nocturia, urinary frequency, and urinary hesitancy. MS:  Denies joint pain, joint redness, joint swelling, loss of strength, low back pain, mid back pain, muscle aches, muscle , cramps, muscle weakness, stiffness, and thoracic pain. Derm:  Complains of lesion(s); denies changes in color of skin, changes in nail beds, dryness, excessive perspiration, flushing, hair loss, insect bite(s), itching, poor wound healing, and rash. Neuro:  Denies brief paralysis, difficulty with concentration, disturbances in coordination, falling down, headaches, inability to speak, memory loss, numbness, poor balance, seizures, sensation of room spinning, tingling, tremors, visual disturbances, and weakness. Psych:  Denies alternate hallucination ( auditory/visual), anxiety, depression, easily angered, easily tearful, irritability, mental problems, panic attacks, sense of great  danger, suicidal thoughts/plans, thoughts of violence, unusual visions or sounds, and thoughts /plans of harming others. Endo:  Denies cold intolerance, excessive hunger, excessive thirst, excessive urination, heat intolerance, polyuria, and weight change. Heme:  Denies abnormal bruising, bleeding, enlarge lymph nodes, fevers, pallor, and skin discoloration. Allergy:  Denies hives or rash, itching eyes, persistent infections, seasonal allergies, and sneezing.  Physical Exam  General:  Well-developed,well-nourished,in no acute distress; alert,appropriate and cooperative throughout examination Head:  Normocephalic and atraumatic without obvious abnormalities. No apparent alopecia or balding. Eyes:  vision grossly intact, pupils equal, pupils round, pupils  reactive to light, and no injection.   Ears:  External ear exam shows no significant lesions or deformities.  Otoscopic examination reveals clear canals, tympanic membranes are intact bilaterally without bulging, retraction, inflammation or discharge. Hearing is grossly normal bilaterally. Nose:  External nasal examination shows no deformity or inflammation. Nasal mucosa are pink and moist without lesions or exudates. Mouth:  Oral mucosa and oropharynx without lesions or exudates.  Teeth in good repair. Neck:  No deformities, masses, or tenderness noted. Chest Wall:  No deformities, masses, or tenderness noted. Breasts:  gyn Lungs:  Normal respiratory effort, chest expands symmetrically. Lungs are clear to auscultation, no crackles or wheezes. Heart:  normal rate, regular rhythm, and no murmur.   Abdomen:  Bowel sounds positive,abdomen soft and non-tender without masses, organomegaly or hernias noted. Rectal:  gyn Genitalia:  gyn Msk:  normal ROM, no joint tenderness, no joint swelling, no joint warmth, no redness over joints, no joint deformities, no joint instability, and no crepitation.   Pulses:  R posterior tibial normal, R dorsalis pedis normal, R carotid normal, L posterior tibial normal, L dorsalis pedis normal, and L carotid normal.   Extremities:  No clubbing, cyanosis, edema, or deformity noted with normal full range of motion of all joints.   Neurologic:  No cranial nerve deficits noted. Station and gait are normal. Plantar reflexes are down-going bilaterally. DTRs are symmetrical throughout. Sensory, motor and coordinative functions appear intact. Skin:  Intact without suspicious lesions or rashes Cervical Nodes:  No lymphadenopathy noted Psych:  Cognition and judgment appear intact. Alert and cooperative with normal attention span and concentration. No apparent delusions, illusions, hallucinations  Diabetes Management Exam:    Foot Exam (with socks and/or shoes not present):        Sensory-Pinprick/Light touch:          Left medial foot (L-4): normal          Left dorsal foot (L-5): normal          Left lateral foot (S-1): normal          Right medial foot (L-4): normal          Right dorsal foot (L-5): normal          Right lateral foot (S-1): normal       Sensory-Monofilament:          Left foot: normal          Right foot: normal       Inspection:          Left foot: normal          Right foot: normal       Nails:          Left foot: normal          Right foot: normal    Eye Exam:       Eye Exam done elsewhere  Date: 11/27/2008          Results: normal          Done by: Pearlean Brownie   Impression & Recommendations:  Problem # 1:  PREVENTIVE HEALTH CARE (ICD-V70.0)  ghm utd labs reviewed  Orders: EKG w/ Interpretation (93000)  Problem # 2:  UNSPECIFIED VITAMIN D DEFICIENCY (ICD-268.9) check labs  Problem # 3:  HYPERLIPIDEMIA NEC/NOS (ICD-272.4)  Her updated medication list for this problem includes:    Crestor 20 Mg Tabs (Rosuvastatin calcium) .Marland Kitchen... 1 by mouth once daily    Welchol 625 Mg Tabs (Colesevelam hcl) .Marland KitchenMarland KitchenMarland KitchenMarland Kitchen 6 qd  Labs Reviewed: SGOT: 25 (11/14/2008)   SGPT: 29 (11/14/2008)   HDL:40 (11/14/2008), 33.3 (06/27/2008)  LDL:41 (11/14/2008), 47 (06/27/2008)  Chol:97 (11/14/2008), 93 (06/27/2008)  Trig:80 (11/14/2008), 62 (06/27/2008)  Orders: EKG w/ Interpretation (93000)  Problem # 4:  HYPERTENSION, ESSENTIAL NOS (ICD-401.9)  Her updated medication list for this problem includes:    Benicar 40 Mg Tabs (Olmesartan medoxomil) .Marland Kitchen... Take 1 tablet by mouth once a day    Lasix 20 Mg Tabs (Furosemide) .Marland Kitchen... Take 2 tablet by mouth once a day  BP today: 130/80 Prior BP: 138/74 (11/18/2008)  Labs Reviewed: K+: 4.8 (11/14/2008) Creat: : 0.85 (11/14/2008)   Chol: 97 (11/14/2008)   HDL: 40 (11/14/2008)   LDL: 41 (11/14/2008)   TG: 80 (11/14/2008)  Orders: EKG w/ Interpretation (93000)  Problem # 5:  DIABETES MELLITUS, TYPE II  (ICD-250.00)  Her updated medication list for this problem includes:    Benicar 40 Mg Tabs (Olmesartan medoxomil) .Marland Kitchen... Take 1 tablet by mouth once a day    Riomet 500 Mg/28ml Soln (Metformin hcl) .Marland Kitchen... 2 tsp by mouth two times a day    Janumet 50-500 Mg Tabs (Sitagliptin-metformin hcl) .Marland Kitchen... Take one tablet by mouth two times daily.    Victoza 18 Mg/66ml Soln (Liraglutide) .Marland Kitchen... 0.6 ml daily    Humalog Kwikpen 100 Unit/ml Soln (Insulin lispro (human)) ..... Sliding scale  Labs Reviewed: Creat: 0.85 (11/14/2008)     Last Eye Exam: normal (11/27/2008) Reviewed HgBA1c results: 7.2 (11/14/2008)  7.4 (06/27/2008)  Orders: EKG w/ Interpretation (93000)  Complete Medication List: 1)  Ambien 10 Mg Tabs (Zolpidem tartrate) .... Take 1 tablet by mouth once a day 2)  Celexa 20 Mg Tabs (Citalopram hydrobromide) .Marland Kitchen.. 1 by mouth once daily 3)  Benicar 40 Mg Tabs (Olmesartan medoxomil) .... Take 1 tablet by mouth once a day 4)  Prevacid 30 Mg Cpdr (Lansoprazole) .Marland Kitchen.. 1 capsule by mouth once a day 5)  Clarinex 5 Mg Tabs (Desloratadine) .... Take 1 tablet by mouth once a day 6)  Lasix 20 Mg Tabs (Furosemide) .... Take 2 tablet by mouth once a day 7)  Vivelle-dot 0.1 Mg/24hr Pttw (Estradiol) 8)  Crestor 20 Mg Tabs (Rosuvastatin calcium) .Marland Kitchen.. 1 by mouth once daily 9)  Riomet 500 Mg/21ml Soln (Metformin hcl) .... 2 tsp by mouth two times a day 10)  Janumet 50-500 Mg Tabs (Sitagliptin-metformin hcl) .... Take one tablet by mouth two times daily. 11)  Klor-con M10 10 Meq Tbcr (Potassium chloride crys cr) .Marland Kitchen.. 1 by mouth once daily 12)  Welchol 625 Mg Tabs (Colesevelam hcl) .... 6 qd 13)  Vitamin D 04540 Unit Caps (Ergocalciferol) .... Take 1 tab weekly 14)  Victoza 18 Mg/46ml Soln (Liraglutide) .... 0.6 ml daily 15)  Freestyle Lancets Misc (Lancets) .... Pt test3 x a day 16)  Novafine 30 Gauge Needles  .... Pt test 3 x  a day 17)  Freestyle Strips  .... Pt test 3 x a day 18)  Prednisone 2.5 Mg Tabs  (Prednisone) .Marland Kitchen.. 1 tid 19)  Mucinex Cold For Kids 2.5-100 Mg/26ml Liqd (Phenylephrine-guaifenesin) .... Prn 20)  Flexeril 10 Mg Tabs (Cyclobenzaprine hcl) .... Prn 21)  Vicodin 5-500 Mg Tabs (Hydrocodone-acetaminophen) .... Prn 22)  Lab Orders  .Marland KitchenMarland Kitchen. 250.00 hep, lipid, bmp, hgba1c 23)  Vitamin D3 2000 Unit Caps (Cholecalciferol) .... Daily 24)  Humalog Kwikpen 100 Unit/ml Soln (Insulin lispro (human)) .... Sliding scale  Other Orders: Tdap => 58yrs IM (69629) Admin 1st Vaccine (52841)   EKG  Procedure date:  01/02/2009  Findings:      sinus rhythm 75 bpm      Immunization History:  Influenza Immunization History:    Influenza:  Fluvax 3+ (04/24/2008)  H1N1 History:    H1N1 # 1:  Influenza virus vaccine- pandemic formulation (H1N1) (07/23/2008)     Tetanus/Td Vaccine    Vaccine Type: Tdap    Site: left deltoid    Mfr: GlaxoSmithKline    Dose: 0.5 ml    Route: IM    Given by: Ardyth Man    Exp. Date: 05/15/2011    Lot #: [id number]    VIS given: 05/30/07 version given January 02, 2009.

## 2010-08-11 NOTE — Progress Notes (Signed)
Summary: lowne-yeast infection/ldr [name] see please  Medications Added FLUCONAZOLE 150 MG  TABS (FLUCONAZOLE) 1 by mouth  x 1       Phone Note Call from Patient Call back at Work Phone [phone] Call back at [phone]   Caller: Patient Summary of Call: pt is calling for something for a yeast infection to be call in to [facility] on [address]. Initial call taken by: Freddy Jaksch,  June 23, 2007 9:06 AM  Follow-up for Phone Call        LEFT MSG TO CALL...................................................................Marland KitchenKandice Hams  June 23, 2007 9:47 AM   Additional Follow-up for Phone Call Additional follow up Details #1::        dr Drue Novel pt is diabetic can you call in something?  [employer]...................................................................Marland KitchenKandice Hams  June 23, 2007 4:14 PM diflucan 150 x 1....................................................................Haevyn Ury E. Shuntae Herzig MD  June 23, 2007 5:08 PM     New/Updated Medications: FLUCONAZOLE 150 MG  TABS (FLUCONAZOLE) 1 by mouth  x 1   Prescriptions: FLUCONAZOLE 150 MG  TABS (FLUCONAZOLE) 1 by mouth  x 1  #1 x 0   Entered by:   Shary Decamp   Authorized by:   Nolon Rod. Akeia Perot MD   Signed by:   Shary Decamp on 06/23/2007   Method used:   Electronically sent to ...       [facility] [address]       [address]       [place], [place]  [address]       Ph: [phone]       Fax: [phone]   RxID:   [id number]

## 2010-08-11 NOTE — Progress Notes (Signed)
Summary: rx  Phone Note Call from Patient Call back at Home Phone [phone]   Caller: [phone]/pt Call For: lucy Summary of Call: per pt call please send the [facility] 625 MG  TABS (COLESEVELAM HCL) 6 qd  #180 x 11 to [facility].  [place]. [address] [place], [place] [address]. [phone].   , please send a generic    Initial call taken by: Shelbie Proctor,  December 11, 2007 9:47 AM  Follow-up for Phone Call        Sent new Rx to [facility]. Pls notify patient Follow-up by: Orlan Leavens,  December 11, 2007 10:14 AM  Additional Follow-up for Phone Call Additional follow up Details #1::        December 11, 2007 10:37 AM  called pt to inform  spoke with pt mother made aware  Additional Follow-up by: Shelbie Proctor,  December 11, 2007 10:38 AM      Prescriptions: WELCHOL 625 MG  TABS (COLESEVELAM HCL) 6 qd  #180 x 11   Entered by:   Orlan Leavens   Authorized by:   Minus Breeding MD   Signed by:   Orlan Leavens on 12/11/2007   Method used:   Electronically sent to ...       [facility] [address]       [address]       [place], [place]  [address]       Ph: [phone] or [phone]       Fax: [phone]   RxID:   [id number]

## 2010-08-11 NOTE — Letter (Signed)
Summary: [facility]   Imported By: Lanelle Bal 01/15/2010 10:32:53  _____________________________________________________________________  External Attachment:    Type:   Image     Comment:   External Document

## 2010-08-11 NOTE — Progress Notes (Signed)
Summary: lowne-sending 3 mo review fmla paperwork-FYI--FYI--FYI   Phone Note Call from Patient Call back at Work Phone [phone] Call back at or [phone] either until 2:30   Caller: Patient Summary of Call: pt is sending over fmla papers that have to be redone every 3 months.  she states that the forms through human resources has changed completely as well as the process.  she needs the forms back her (not human resources)  then she has to turn them in to human resources by the 25th. Initial call taken by: Gwen Pounds,  September 20, 2007 11:51 AM

## 2010-08-11 NOTE — Assessment & Plan Note (Signed)
Summary: discuss meds before sx /clearance?/cbs   Vital Signs:  Patient profile:   55 year old female Weight:      255.13 pounds Pulse rate:   88 / minute Pulse rhythm:   regular BP sitting:   132 / 80  (left arm) Cuff size:   large  Vitals Entered By: Army Fossa CMA (December 25, 2009 11:11 AM) CC: Pt here to discuss meds before Surgery.  Comments refill crestor, benicar, celexa, lasix, klor con, welchol, victoza, metformin xr, flexeril, vicodin. Pt wants to crestor and lasix to outpatient.    History of Present Illness: Pt here to discuss meds before lab band surgery.  Dr [name] told pt she could stay on everything and just take with veg. oil.   She will hold victoza day of surgery.  Allergies: 1)  ! Septra 2)  ! Bactrim 3)  ! Macrodantin 4)  ! Morphine 5)  ! * Fentanyl 6)  ! Percocet 7)  Codeine Phosphate (Codeine Phosphate) 8)  * Inh 9)  * Ivp Dye 10)  Morphine Sulfate (Morphine Sulfate) 11)  Penicillin G Potassium (Penicillin G Potassium) 12)  Trimethoprim (Trimethoprim)  Physical Exam  General:  Well-developed,well-nourished,in no acute distress; alert,appropriate and cooperative throughout examination Lungs:  Normal respiratory effort, chest expands symmetrically. Lungs are clear to auscultation, no crackles or wheezes. Heart:  normal rate and no murmur.   Psych:  Oriented X3 and normally interactive.     Impression & Recommendations:  Problem # 1:  MORBID OBESITY (ICD-278.01)  Pt having lap band in 4 weeks  Ht: 71 (04/16/2009)   Wt: 255.13 (12/25/2009)   BMI: 36.41 (06/10/2009)  Problem # 2:  DEPRESSION (ICD-311)  Her updated medication list for this problem includes:    Celexa 20 Mg Tabs (Citalopram hydrobromide) .Marland Kitchen... 1 by mouth once daily  Problem # 3:  HYPERLIPIDEMIA NEC/NOS (ICD-272.4) labs from [facility] reviewed with pt Her updated medication list for this problem includes:    Crestor 20 Mg Tabs (Rosuvastatin calcium) .Marland Kitchen... 1 by mouth once daily  Welchol 625 Mg Tabs (Colesevelam hcl) .Marland KitchenMarland KitchenMarland KitchenMarland Kitchen 6 qd  Labs Reviewed: SGOT: 28 (06/30/2009)   SGPT: 32 (06/30/2009)   HDL:39.70 (06/30/2009), 40 (11/14/2008)  LDL:52 (06/30/2009), 41 (11/14/2008)  Chol:111 (06/30/2009), 97 (11/14/2008)  Trig:97.0 (06/30/2009), 80 (11/14/2008)  Problem # 4:  HYPERTENSION, ESSENTIAL NOS (ICD-401.9) labs reviewed  Her updated medication list for this problem includes:    Benicar 40 Mg Tabs (Olmesartan medoxomil) .Marland Kitchen... Take 1 tablet by mouth once a day    Lasix 20 Mg Tabs (Furosemide) .Marland Kitchen... Take 2 tablet by mouth once a day  Problem # 5:  DIABETES MELLITUS, TYPE II (ICD-250.00) labs from [facility] reviewed  Her updated medication list for this problem includes:    Benicar 40 Mg Tabs (Olmesartan medoxomil) .Marland Kitchen... Take 1 tablet by mouth once a day    Victoza 18 Mg/53ml Soln (Liraglutide) ..... Use 1.8 mg daily    Metformin Hcl 500 Mg Xr24h-tab (Metformin hcl) .Marland KitchenMarland KitchenMarland KitchenMarland Kitchen 4 qam    Amaryl 2 Mg Tabs (Glimepiride) .Marland Kitchen... 1 by mouth daily.    Riomet 500 Mg/14ml Soln (Metformin hcl) .Marland Kitchen... 2 tsp two times a day  Complete Medication List: 1)  Ambien 10 Mg Tabs (Zolpidem tartrate) .... Take 1 tablet by mouth once a day 2)  Celexa 20 Mg Tabs (Citalopram hydrobromide) .Marland Kitchen.. 1 by mouth once daily 3)  Benicar 40 Mg Tabs (Olmesartan medoxomil) .... Take 1 tablet by mouth once a day 4)  Prevacid 24hr  15 Mg Cpdr (Lansoprazole) .... Take 1 tablet by mouth once a day 5)  Loratadine 10 Mg Tabs (Loratadine) .... Take 1 tablet by mouth once a day 6)  Lasix 20 Mg Tabs (Furosemide) .... Take 2 tablet by mouth once a day 7)  Vivelle-dot 0.1 Mg/24hr Pttw (Estradiol) .Marland Kitchen.. 1 patch 2x a week 8)  Crestor 20 Mg Tabs (Rosuvastatin calcium) .Marland Kitchen.. 1 by mouth once daily 9)  Klor-con M10 10 Meq Tbcr (Potassium chloride crys cr) .Marland Kitchen.. 1 by mouth once daily 10)  Welchol 625 Mg Tabs (Colesevelam hcl) .... 6 qd 11)  Victoza 18 Mg/25ml Soln (Liraglutide) .... Use 1.8 mg daily 12)  True Track Lancets  .... Pt test3 x a  day 13)  Novafine 30 Gauge Needles  .... Pt test 3 x a day 14)  True Track Strips  .... Pt test 3 x a day 15)  Flexeril 10 Mg Tabs (Cyclobenzaprine hcl) .Marland Kitchen.. 1 by mouth three times a day as needed 16)  Vicodin 5-500 Mg Tabs (Hydrocodone-acetaminophen) .Marland Kitchen.. 1 by mouth every 6 hours as needed 17)  Vitamin D3 2000 Unit Caps (Cholecalciferol) .... Daily 18)  Bd Pen Needle Mini U/f 31g X 5 Mm Misc (Insulin pen needle) .... As directed 19)  Metformin Hcl 500 Mg Xr24h-tab (Metformin hcl) .... 4 qam 20)  Qvar 80 Mcg/act Aers (Beclomethasone dipropionate) .... 2 puffs two times a day as needed 21)  Vagifem 10 Mcg Tabs (Estradiol) .Marland Kitchen.. 1 pv 2x a week 22)  Amaryl 2 Mg Tabs (Glimepiride) .Marland Kitchen.. 1 by mouth daily. 23)  Riomet 500 Mg/20ml Soln (Metformin hcl) .... 2 tsp two times a day 24)  Truetrack Test Strp (Glucose blood) .... Qid    Prescriptions: VICODIN 5-500 MG TABS (HYDROCODONE-ACETAMINOPHEN) 1 by mouth every 6 hours as needed  #30 x 0   Entered and Authorized by:   [name] DO   Signed by:   [name] DO on 12/25/2009   Method used:   Print then Give to Patient   RxID:   [id number] FLEXERIL 10 MG TABS (CYCLOBENZAPRINE HCL) 1 by mouth three times a day as needed  #30 x 0   Entered and Authorized by:   [name] DO   Signed by:   [name] DO on 12/25/2009   Method used:   Print then Give to Patient   RxID:   [id number] DGLOVFI 18 MG/3ML SOLN (LIRAGLUTIDE) Use 1.8 mg daily  #3 x 3   Entered and Authorized by:   [name] DO   Signed by:   [name] DO on 12/25/2009   Method used:   Print then Give to Patient   RxID:   [id number] KLOR-CON M10 10 MEQ  TBCR (POTASSIUM CHLORIDE CRYS CR) 1 by mouth once daily  #90 x 3   Entered and Authorized by:   [name] DO   Signed by:   [name] DO on 12/25/2009   Method used:   Print then Give to Patient   RxID:   [id number] BENICAR 40 MG TABS (OLMESARTAN MEDOXOMIL) Take 1 tablet by mouth once a day  #90 x  3   Entered and Authorized by:   [name] DO   Signed by:   [name] DO on 12/25/2009   Method used:   Print then Give to Patient   RxID:   [id number] CELEXA 20 MG TABS (CITALOPRAM HYDROBROMIDE) 1 by mouth once daily  #90 x 3   Entered and Authorized by:  [name] DO   Signed by:   [name] DO on 12/25/2009   Method used:   Print then Give to Patient   RxID:   [id number] TRUE TRACK STRIPS PT TEST 3 X A DAY  #3 months x 3   Entered and Authorized by:   [name] DO   Signed by:   [name] DO on 12/25/2009   Method used:   Faxed to ...       [facility]* (retail)       [address]       [address] Shipping/mailing       [place], [place]  [address]       Ph: [phone]       Fax: [phone]   RxID:   [id number] TRUE TRACK LANCETS PT TEST3 X A DAY  #50months x 11   Entered and Authorized by:   [name] DO   Signed by:   [name] DO on 12/25/2009   Method used:   Faxed to ...       [facility]* (retail)       [address]       [address] Shipping/mailing       [place], [place]  [address]       Ph: [phone]       Fax: [phone]   RxID:   [id number] AMARYL 2 MG TABS (GLIMEPIRIDE) 1 by mouth daily.  #90 x 3   Entered and Authorized by:   [name] DO   Signed by:   [name] DO on 12/25/2009   Method used:   Electronically to        [facility]* (retail)       [address]       [address] Shipping/mailing       [place], [place]  [address]       Ph: [phone]       Fax: [phone]   RxID:   [id number] CRESTOR 20 MG  TABS (ROSUVASTATIN CALCIUM) 1 by mouth once daily  #90 x 3   Entered and Authorized by:   [name] DO   Signed by:   [name] DO on 12/25/2009   Method used:   Electronically to        [facility]* (retail)       [address]       [address] Shipping/mailing       [place], [place]   [address]       Ph: [phone]       Fax: [phone]   RxID:   [id number] LASIX 20 MG TABS (FUROSEMIDE) Take 2 tablet by mouth once a day  #180 x 3   Entered and Authorized by:   [name] DO   Signed by:   [name] DO on 12/25/2009   Method used:   Electronically to        [facility]* (retail)       [address]       [address] Shipping/mailing       [place], [place]  [address]       Ph: [phone]       Fax: [phone]   RxID:   [id number] SNKNLZJQB TEST  STRP (GLUCOSE BLOOD) qid  #100 x 3   Entered by:   Army Fossa CMA  Authorized by:   [name] DO   Signed by:   Army Fossa CMA on 12/25/2009   Method used:   Electronically to        [facility]* (retail)       [address]       [address] [place] Shipping/mailing       [place], [place]  [address]       Ph: [phone]       Fax: [phone]   RxID:   [id number]

## 2010-08-11 NOTE — Progress Notes (Signed)
Summary: Needs stronger abx//lch   Phone Note Call from Patient Message from:  Patient  Caller: Patient Summary of Call: Patient says she say Dr. Beverely Low on Monday for a sinus infection and was prescribed Levaquin and it is noty working. She is still coughing up yellowish/grenn sputum and head still feel like it will split open. Can you give her something stronger or does she need to come in to see you? You can call her back at [phone].  Initial call taken by: Harold Barban,  October 16, 2009 1:50 PM  Follow-up for Phone Call        Levaquin is the strongest by mouth abx and she is allergic to everything else.   Steroids may help but that will elevated BS. Follow-up by: [name] DO,  October 16, 2009 2:03 PM  Additional Follow-up for Phone Call Additional follow up Details #1::        Tried to call pt at home, left a message with a female. Work number not in [employer]. Army Fossa CMA  October 16, 2009 2:33 PM     Additional Follow-up for Phone Call Additional follow up Details #2::    left message to call back with a female at the home number. Army Fossa CMA  October 20, 2009 8:41 AM   Additional Follow-up for Phone Call Additional follow up Details #3:: Details for Additional Follow-up Action Taken: Pt is aware she states that she is feeling somewhat better. She will call if she is not fully better after finishing the levaquin.Army Fossa CMA  October 21, 2009 10:47 AM

## 2010-08-11 NOTE — Consult Note (Signed)
Summary: [facility]  [facility]   Imported By: Lanelle Bal 03/27/2008 10:05:46  _____________________________________________________________________  External Attachment:    Type:   Image     Comment:   External Document

## 2010-08-11 NOTE — Progress Notes (Signed)
Summary: [id number]-fld  Phone Note Outgoing Call   Call placed by: Jeremy Johann CMA,  July 02, 2008 11:38 AM Details for Reason: lab results Summary of Call: left message to call office with pt mother caregiver to have pt call office.   Byetta 5 micrograms [place] two times a day for 1 month   then Byetta 10 micrograms [place] two times a day  22month with 2 refills recheck labs 3 months     250.00 hep, lipid, bmp, hgba1c Initial call taken by: Jeremy Johann CMA,  July 02, 2008 11:39 AM  Follow-up for Phone Call        Patient aware and rx sent to [facility] on [facility] Ardyth Man  July 02, 2008 4:07 PM  Follow-up by: Ardyth Man,  July 02, 2008 4:07 PM    New/Updated Medications: BYETTA 5 MCG PEN 5 MCG/0.02ML SOLN (EXENATIDE) Take 5 micrograms [place] two times a day for 1 month  then 10 micrograms [place] two times a day   Prescriptions: BYETTA 5 MCG PEN 5 MCG/0.02ML SOLN (EXENATIDE) Take 5 micrograms [place] two times a day for 1 month  then 10 micrograms [place] two times a day  #1 x 2   Entered by:   Ardyth Man   Authorized by:   [name] DO   Signed by:   Ardyth Man on 07/02/2008   Method used:   Electronically to        [facility]* (retail)       [address]       [place], [place]  [address]       Ph: [phone] or [phone]       Fax: [phone]   RxID:   [id number] BYETTA 5 MCG PEN 5 MCG/0.02ML SOLN (EXENATIDE) Take 5 micrograms [place] two times a day for 1 month  then 10 micrograms  two times a day  #1 x 2   Entered by:   Freddy Jaksch   Authorized by:   [name] DO   Signed by:   Freddy Jaksch on 07/02/2008   Method used:   Electronically to        [facility] [address] (retail)       [phone]       [place], [place]  [address]       Ph: [phone]       Fax: [phone]   RxID:   [id number]

## 2010-08-11 NOTE — Letter (Signed)
Summary: [facility]   Imported By: Lanelle Bal 10/11/2009 11:14:33  _____________________________________________________________________  External Attachment:    Type:   Image     Comment:   External Document

## 2010-08-11 NOTE — Letter (Signed)
Summary: Results Follow-up Letter  [place] at [facility]  [address] [place], [place] [address]   Phone: [phone]  Fax: [phone]    07/19/2008           Sarabeth Benton [address] [place], [place]  [address]  Dear Ms. Labine,   The following are the results of your recent test(s):  Test     Result     Pap Smear    Normal_______  Not Normal_____       Comments: _________________________________________________________ Cholesterol LDL(Bad cholesterol):          Your goal is less than:         HDL (Good cholesterol):        Your goal is more than: _________________________________________________________ Other Tests:   _________________________________________________________  Please call for an appointment Or Please see attached labs._________________________________________________________ _________________________________________________________ _________________________________________________________  Sincerely,  Felecia Deloach CMA [employer] at [facility]

## 2010-08-11 NOTE — Assessment & Plan Note (Signed)
Summary: RTO 1 MONTH/ALR   Vital Signs:  Patient profile:   55 year old female Height:      71 inches Weight:      255 pounds Temp:     98.1 degrees F oral Pulse rate:   92 / minute BP sitting:   130 / 70  (left arm)  Vitals Entered By: Jeremy Johann CMA (March 03, 2009 3:14 PM) CC: 1 MONTH F/U   History of Present Illness: Pt here for 1 month f/u weight.  Pt doing well with diet and exercise.  No complaints.   Current Medications (verified): 1)  Ambien 10 Mg Tabs (Zolpidem Tartrate) .... Take 1 Tablet By Mouth Once A Day 2)  Celexa 20 Mg Tabs (Citalopram Hydrobromide) .Marland Kitchen.. 1 By Mouth Once Daily 3)  Benicar 40 Mg Tabs (Olmesartan Medoxomil) .... Take 1 Tablet By Mouth Once A Day 4)  Prevacid 30 Mg Cpdr (Lansoprazole) .Marland Kitchen.. 1 Capsule By Mouth Once A Day 5)  Clarinex 5 Mg Tabs (Desloratadine) .... Take 1 Tablet By Mouth Once A Day 6)  Lasix 20 Mg Tabs (Furosemide) .... Take 2 Tablet By Mouth Once A Day 7)  Vivelle-Dot 0.1 Mg/24hr Pttw (Estradiol) 8)  Crestor 20 Mg  Tabs (Rosuvastatin Calcium) .Marland Kitchen.. 1 By Mouth Once Daily 9)  Klor-Con M10 10 Meq  Tbcr (Potassium Chloride Crys Cr) .Marland Kitchen.. 1 By Mouth Once Daily 10)  Welchol 625 Mg  Tabs (Colesevelam Hcl) .... 6 Qd 11)  Vitamin D 14782 Unit Caps (Ergocalciferol) .... Take 1 Tab Weekly 12)  Victoza 18 Mg/24ml Soln (Liraglutide) .... Use 1.8 Mg Daily 13)  Freestyle Lancets  Misc (Lancets) .... Pt Test3 X A Day 14)  Novafine 30 Gauge Needles .... Pt Test 3 X A Day 15)  Freestyle Strips .... Pt Test 3 X A Day 16)  Prednisone 2.5 Mg Tabs (Prednisone) .Marland Kitchen.. 1 Tid 17)  Mucinex Cold For Kids 2.5-100 Mg/19ml Liqd (Phenylephrine-Guaifenesin) .... Prn 18)  Flexeril 10 Mg Tabs (Cyclobenzaprine Hcl) .... Prn 19)  Vicodin 5-500 Mg Tabs (Hydrocodone-Acetaminophen) .... Prn 20)  Lab Orders .Marland KitchenMarland Kitchen. 250.00 Hep, Lipid, Bmp, Hgba1c 21)  Vitamin D3 2000 Unit Caps (Cholecalciferol) .... Daily 22)  Humalog Kwikpen 100 Unit/ml Soln (Insulin Lispro (Human)) ....  Sliding Scale 23)  Bd Pen Needle Mini U/f 31g X 5 Mm Misc (Insulin Pen Needle) .... As Directed 24)  Metformin Hcl 500 Mg Tabs (Metformin Hcl) .... 2 Tabs Two Times A Day 25)  Metformin Hcl 500 Mg Xr24h-Tab (Metformin Hcl) .... 4 Qam  Allergies: 1)  ! Septra 2)  ! Bactrim 3)  ! Macrodantin 4)  ! Morphine 5)  ! * Fentanyl 6)  ! Phenergan 7)  ! Percocet 8)  Codeine Phosphate (Codeine Phosphate) 9)  * Inh 10)  * Ivp Dye 11)  Morphine Sulfate (Morphine Sulfate) 12)  Penicillin G Potassium (Penicillin G Potassium) 13)  Trimethoprim (Trimethoprim)  Past History:  Past medical, surgical, family and social histories (including risk factors) reviewed, and no changes noted (except as noted below).  Past Medical History: Reviewed history from 11/04/2008 and no changes required. Allergic rhinitis Diabetes mellitus, type II Headache menopause gerd allergic rhinitis ibs Anxiety Depression Fibromyalgia Polyarthalgia  Past Surgical History: Reviewed history from 11/04/2008 and no changes required. Caesarean section Cervical laminectomy Cholecystectomy GYN surgery Hysterectomy Tubal ligation Tonsillectomy Foot surgery Rt Knee Sinus surgery  Family History: Reviewed history from 11/04/2008 and no changes required. Family History Diabetes 1st degree relative (both parents) Mother, D, Stroke,  Breast CA, A-fib, HTN, Diabetes Father,D, Stroke, HTn, Diabetes, Thyroid, Heart disese  Social History: Reviewed history from 11/04/2008 and no changes required. 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 Lungs are clear to auscultation, no crackles or wheezes. Heart:  Normal rate and regular rhythm. S1 and S2 normal without  gallop, murmur, click, rub or other extra sounds. Psych:  Cognition and judgment appear intact. Alert and cooperative with normal attention span and concentration. No apparent delusions, illusions, hallucinations   Impression & Recommendations:  Problem # 1:  MORBID OBESITY (ICD-278.01) D/w pt diet and exercise rto 1 month Ht: 71 (03/03/2009)   Wt: 255 (03/03/2009)   BMI: 35.55 (02/24/2009)  Problem # 2:  DIABETES MELLITUS, TYPE II (ICD-250.00)  Her updated medication list for this problem includes:    Benicar 40 Mg Tabs (Olmesartan medoxomil) .Marland Kitchen... Take 1 tablet by mouth once a day    Victoza 18 Mg/86ml Soln (Liraglutide) ..... Use 1.8 mg daily    Humalog Kwikpen 100 Unit/ml Soln (Insulin lispro (human)) ..... Sliding scale    Metformin Hcl 500 Mg Tabs (Metformin hcl) .Marland Kitchen... 2 tabs two times a day    Metformin Hcl 500 Mg Xr24h-tab (Metformin hcl) .Marland KitchenMarland KitchenMarland KitchenMarland Kitchen 4 qam  Labs Reviewed: Creat: 0.85 (11/14/2008)     Last Eye Exam: normal (11/27/2008) Reviewed HgBA1c results: 7.5 (02/26/2009)  7.2 (11/14/2008)  Complete Medication List: 1)  Ambien 10 Mg Tabs (Zolpidem tartrate) .... Take 1 tablet by mouth once a day 2)  Celexa 20 Mg Tabs (Citalopram hydrobromide) .Marland Kitchen.. 1 by mouth once daily 3)  Benicar 40 Mg Tabs (Olmesartan medoxomil) .... Take 1 tablet by mouth once a day 4)  Prevacid 30 Mg Cpdr (Lansoprazole) .Marland Kitchen.. 1 capsule by mouth once a day 5)  Clarinex 5 Mg Tabs (Desloratadine) .... Take 1 tablet by mouth once a day 6)  Lasix 20 Mg Tabs (Furosemide) .... Take 2 tablet by mouth once a day 7)  Vivelle-dot 0.1 Mg/24hr Pttw (Estradiol) 8)  Crestor 20 Mg Tabs (Rosuvastatin calcium) .Marland Kitchen.. 1 by mouth once daily 9)  Klor-con M10 10 Meq Tbcr (Potassium chloride crys cr) .Marland Kitchen.. 1 by mouth once daily 10)  Welchol 625 Mg Tabs (Colesevelam hcl) .... 6 qd 11)  Vitamin D [address] Unit Caps (Ergocalciferol) .... Take 1 tab weekly 12)  Victoza 18 Mg/31ml Soln (Liraglutide) .... Use 1.8 mg daily 13)  Freestyle  Lancets Misc (Lancets) .... Pt test3 x a day 14)  Novafine 30 Gauge Needles  .... Pt test 3 x a day 15)  Freestyle Strips  .... Pt test 3 x a day 16)  Prednisone 2.5 Mg Tabs (Prednisone) .Marland Kitchen.. 1 tid 17)  Mucinex Cold For Kids 2.5-100 Mg/32ml Liqd (Phenylephrine-guaifenesin) .... Prn 18)  Flexeril 10 Mg Tabs (Cyclobenzaprine hcl) .... Prn 19)  Vicodin 5-500 Mg Tabs (Hydrocodone-acetaminophen) .... Prn 20)  Lab Orders  .Marland KitchenMarland Kitchen. 250.00 hep, lipid, bmp, hgba1c 21)  Vitamin D3 2000 Unit Caps (Cholecalciferol) .... Daily 22)  Humalog Kwikpen 100 Unit/ml Soln (Insulin lispro (human)) .... Sliding scale 23)  Bd Pen Needle Mini U/f 31g X 5 Mm Misc (Insulin pen needle) .... As directed 24)  Metformin Hcl 500 Mg Tabs (Metformin hcl) .... 2 tabs two times a day 25)  Metformin Hcl 500 Mg Xr24h-tab (Metformin  hcl) .... 4 qam

## 2010-08-11 NOTE — Letter (Signed)
Summary: Results Follow-up Letter  [place] at [facility]  [address] [place], [place] [address]   Phone: [phone]  Fax: [phone]    06/14/2008        Krishana Lutze [address] [place], [place]  [address]  Dear Ms. Vallin,   The following are the results of your recent test(s):  Test     Result     Pap Smear    Normal_______  Not Normal_____       Comments: _________________________________________________________ Cholesterol LDL(Bad cholesterol):          Your goal is less than:         HDL (Good cholesterol):        Your goal is more than: _________________________________________________________ Other Tests:   _________________________________________________________  Please call for an appointment Or Please see attached labs._________________________________________________________ _________________________________________________________ _________________________________________________________  Sincerely,  Felecia Deloach CMA  at [facility]

## 2010-08-11 NOTE — Assessment & Plan Note (Signed)
Summary: 3 MTH FU-STC    Vital Signs:  Patient Profile:   55 Years Old Female Weight:      243.8 pounds Temp:     98.6 degrees F oral Pulse rate:   71 / minute BP sitting:   145 / 86  (left arm) Cuff size:   large  Vitals Entered By: Orlan Leavens (August 17, 2007 8:56 AM)                 PCP:  Laury Axon   History of Present Illness: patient states she is feeling well in general, and her glucose is well controlled on just metformin, and [place]. left 4th toe still swollen--saw dr tower  Current Allergies: ! SEPTRA ! BACTRIM ! MACRODANTIN ! MORPHINE ! * FENTANYL ! PHENERGAN CODEINE PHOSPHATE (CODEINE PHOSPHATE) * INH * IVP DYE MORPHINE SULFATE (MORPHINE SULFATE) PENICILLIN G POTASSIUM (PENICILLIN G POTASSIUM) TRIMETHOPRIM (TRIMETHOPRIM)  Past Medical History:    Reviewed history from 06/09/2007 and no changes required:       Allergic rhinitis       Diabetes mellitus, type II       Headache       menopause       gerd       allergic rhinitis       ibs       Anxiety       Depression     Review of Systems       denies hypoglycemia   Physical Exam  General:     obese.   Extremities:     trace bilat leg edema the left fourth toe is moderately swollen and red.    Impression & Recommendations:  Problem # 1:  DIABETES MELLITUS, TYPE II (ICD-250.00)  Her updated medication list for this problem includes:    Glucophage Xr 500 Mg Tb24 (Metformin hcl) .Marland KitchenMarland KitchenMarland KitchenMarland Kitchen 4 tablets daily    Januvia 100 Mg Tabs (Sitagliptin phosphate) ..... Qd  Orders: Est. Patient Level III ([address])   Problem # 2:  contusion with ? of cellulitis   Patient Instructions: 1)  same rx 2)  please finish her course of Keflex. 3)  Elevate left lower extremity is much as possible 4)  ret prn 5)  a1c 3 mos 250.00    ]

## 2010-08-11 NOTE — Progress Notes (Signed)
Summary: Meds(lmom 2/23,2/24)  Phone Note Call from Patient Call back at Home Phone [phone]   Summary of Call: Pt states that she needs to be on Riomete 500mg /55ml 2 tsp two times a day. [facility]- [facility] will actually make it for her. Is it fine to send this medication into the pharm for her? Army Fossa CMA  September 03, 2009 1:14 PM   Follow-up for Phone Call        yes that is fine----  1 month,  5 refills Follow-up by: [name] DO,  September 03, 2009 2:17 PM  Additional Follow-up for Phone Call Additional follow up Details #1::        LMTCB. Army Fossa CMA  September 03, 2009 2:29 PM     Additional Follow-up for Phone Call Additional follow up Details #2::    LMTCB. Army Fossa CMA  September 04, 2009 12:05 PM   Additional Follow-up for Phone Call Additional follow up Details #3:: Details for Additional Follow-up Action Taken: LMTCB. Army Fossa CMA  September 09, 2009 3:36 PM   New/Updated Medications: RIOMET 500 MG/5ML SOLN (METFORMIN HCL) 2 tsp two times a day Prescriptions: RIOMET 500 MG/5ML SOLN (METFORMIN HCL) 2 tsp two times a day  #1 month x 5   Entered by:   Army Fossa CMA   Authorized by:   [name] DO   Signed by:   Army Fossa CMA on 09/03/2009   Method used:   Faxed to ...       [facility]* (retail)       [address]       [place], [place]  [address]       Ph: [phone]       Fax: [phone]   RxID:   [id number]

## 2010-08-11 NOTE — Progress Notes (Signed)
Summary: Fibromyalgia   Phone Note Call from Patient Call back at Home Phone [phone]   Caller: Patient/[phone]/cell/[phone] Call For: [name] DO Summary of Call: per Merleen Nicely want to know if Dr Lorane Gell can referrere her to a Dr that treat Fibromyalgia  Initial call taken by: Shelbie Proctor,  May 17, 2008 8:52 AM  Follow-up for Phone Call        please tell pt i only treat her for dm.  this is a better question for dr lowne Follow-up by: Minus Breeding MD,  May 17, 2008 9:19 AM  Additional Follow-up for Phone Call Additional follow up Details #1::        called pt to inform pt made aware  Additional Follow-up by: Shelbie Proctor,  May 17, 2008 9:27 AM

## 2010-08-11 NOTE — Progress Notes (Signed)
Summary: Need ML's  Phone Note Call from Patient Call back at Work Phone [phone] Call back at Cell# [phone]   Caller: Patient Call For: [name] Summary of Call: Patient has questions about Vitamin B12.  She can pick it up this afternoon or tomorrow morning.  Please call cell number first. Initial call taken by: Barnie Mort,  February 16, 2010 1:39 PM  Follow-up for Phone Call        b12 and syringes sent to [facility] Follow-up by: [name],  February 16, 2010 1:48 PM  Additional Follow-up for Phone Call Additional follow up Details #1::        Pharmacy called stating the e-prescribe that was sent over needs ml's [phone] Additional Follow-up by: Barnie Mort,  February 16, 2010 3:12 PM    Additional Follow-up for Phone Call Additional follow up Details #2::    Please advise of qty. Lucious Groves CMA  February 16, 2010 3:53 PM   Additional Follow-up for Phone Call Additional follow up Details #3:: Details for Additional Follow-up Action Taken: The directions say I ml Sq weekly x4  then 1x a month---- I don't understand what else they need  spoke with pt, pt would like rx sent to [facility]. called and cancel rx at [facility]...............Marland KitchenFelecia Deloach CMA  February 16, 2010 5:12 PM  Additional Follow-up by: [name],  February 16, 2010 4:14 PM  New/Updated Medications: CYANOCOBALAMIN 1000 MCG/ML SOLN (CYANOCOBALAMIN) 1 ml subcutaneously weekly x4 then 1x a month BD ECLIPSE SYRINGE 25G X 5/8" 3 ML MISC (SYRINGE/NEEDLE (DISP)) as directed Prescriptions: BD ECLIPSE SYRINGE 25G X 5/8" 3 ML MISC (SYRINGE/NEEDLE (DISP)) as directed  #1 box x 2   Entered by:   Jeremy Johann CMA   Authorized by:   [name]   Signed by:   Jeremy Johann CMA on 02/16/2010   Method used:   Re-Faxed to ...       [facility]* (retail)       [address]       [address] Shipping/mailing       [place], [place]  [address]       Ph:  [phone]       Fax: [phone]   RxID:   [id number] CYANOCOBALAMIN 1000 MCG/ML SOLN (CYANOCOBALAMIN) 1 ml subcutaneously weekly x4 then 1x a month  #1 vial x 1   Entered by:   Jeremy Johann CMA   Authorized by:   [name]   Signed by:   Jeremy Johann CMA on 02/16/2010   Method used:   Re-Faxed to ...       [facility]* (retail)       [address]       [address] Shipping/mailing       [place], [place]  [address]       Ph: [phone]       Fax: [phone]   RxID:   [id number] BD ECLIPSE SYRINGE 25G X 5/8" 3 ML MISC (SYRINGE/NEEDLE (DISP)) as directed  #1 box x 2   Entered and Authorized by:   [name]   Signed by:   [name] on 02/16/2010   Method used:   Electronically to        [facility] [address] (retail)       [phone]  [place], [place]  [address]       Ph: [phone]       Fax: [phone]   RxID:   [id number] CYANOCOBALAMIN 1000 MCG/ML SOLN (CYANOCOBALAMIN) 1 ml subcutaneously weekly x4 then 1x a month  #1 vial x 1   Entered and Authorized by:   [name]   Signed by:   [name] on 02/16/2010   Method used:   Electronically to        [facility] [address] (retail)       [phone]       [place], [place]  [address]       Ph: [phone]       Fax: [phone]   RxID:   [id number]

## 2010-08-11 NOTE — Consult Note (Signed)
Summary: [facility]  [facility]   Imported By: Lanelle Bal 07/16/2008 10:23:46  _____________________________________________________________________  External Attachment:    Type:   Image     Comment:   External Document

## 2010-08-11 NOTE — Progress Notes (Signed)
----   Converted from flag ---- ---- 07/21/2009 10:02 AM, Barb Merino wrote: Patient returning your call. Please call back at [phone]. All patient contact numbers where correct. ------------------------------ I called the pt back and left a message. Army Fossa CMA  July 23, 2009 3:03 PM  Phone Note Call from Patient

## 2010-08-11 NOTE — Letter (Signed)
Summary: [facility]  [facility]   Imported By: Lanelle Bal 10/30/2009 11:24:31  _____________________________________________________________________  External Attachment:    Type:   Image     Comment:   External Document

## 2010-08-11 NOTE — Progress Notes (Signed)
Summary: rx request  Phone Note Call from Patient Call back at [phone]   Summary of Call: Patient was given free coupon/rx for True Track meter and states that it is not working well. Patient c/o of a lot of errors. Patient DM consultant gaver her a free coupon for the Accucheck Aviva meter. Please send prescription for strips and meter so that patient can get the item for free.  Ok to send? Initial call taken by: Lucious Groves CMA,  January 22, 2010 12:54 PM  Follow-up for Phone Call        yes Follow-up by: [name] DO,  January 22, 2010 2:00 PM  Additional Follow-up for Phone Call Additional follow up Details #1::        Patient notified. Additional Follow-up by: Lucious Groves CMA,  January 22, 2010 2:37 PM    New/Updated Medications: ACCU-CHEK AVIVA  STRP (GLUCOSE BLOOD) test 3x per day as directed ACCU-CHEK AVIVA  KIT (BLOOD GLUCOSE MONITORING SUPPL) use as directed daily Prescriptions: ACCU-CHEK AVIVA  KIT (BLOOD GLUCOSE MONITORING SUPPL) use as directed daily  #1 x 0   Entered by:   Lucious Groves CMA   Authorized by:   [name] DO   Signed by:   Lucious Groves CMA on 01/22/2010   Method used:   Faxed to ...       [facility]* (retail)       [address]       [address] Shipping/mailing       [place], [place]  [address]       Ph: [phone]       Fax: [phone]   RxID:   [id number] ACCU-CHEK AVIVA  STRP (GLUCOSE BLOOD) test 3x per day as directed  #100 x 0   Entered by:   Lucious Groves CMA   Authorized by:   [name] DO   Signed by:   Lucious Groves CMA on 01/22/2010   Method used:   Faxed to ...       [facility]* (retail)       [address]       [address] Shipping/mailing       [place], [place]  [address]       Ph: [phone]       Fax: [phone]   RxID:   [id number]

## 2010-08-11 NOTE — Progress Notes (Signed)
  Phone Note Call from Patient   Summary of Call: Spoke with pt and informed her of the Amaryl.  Informed pt letter was ready for her to.     New/Updated Medications: AMARYL 2 MG TABS (GLIMEPIRIDE) 1 by mouth daily. Prescriptions: AMARYL 2 MG TABS (GLIMEPIRIDE) 1 by mouth daily.  #30 x 2   Entered by:   Army Fossa CMA   Authorized by:   [name] DO   Signed by:   Army Fossa CMA on 07/23/2009   Method used:   Electronically to        [facility]* (retail)       [address]       [place], [place]  [address]       Ph: [phone] or [phone]       Fax: [phone]   RxID:   [id number]

## 2010-08-11 NOTE — Progress Notes (Signed)
Summary: PT BROUGHT IN FMLA PAPERWORK   Phone Note Call from Patient Call back at [phone]   Caller: Patient Summary of Call: PATIENT DROPPED OFF FMLA PAPERWORK DISCUSSED IN PREVIOUS PHONE NOTE--PLEASE CALL HER AT [phone] WHEN COMPLETED  WILL PUT IN PLASTIC SLEEVE AND PUT ON CHRAE'S DESK Initial call taken by: Jerolyn Shin,  September 20, 2007 3:48 PM  Follow-up for Phone Call        Information placed on ledge . ..................................................................Marland KitchenChrae Malloy  September 21, 2007 8:16 AM   Additional Follow-up for Phone Call Additional follow up Details #1::        done Additional Follow-up by: [name] DO,  September 22, 2007 9:37 PM    Additional Follow-up for Phone Call Additional follow up Details #2::    PAPERWORK IS COMPLETE, LEFT MESSAGE ON MACHINE FOR PATIENT TO CALL-REASON FOR CALL WAS TO SEE IF PATIENT WOULD LIKE TO PICK-UP OR MAIL? ..................................................................Marland KitchenChrae Malloy  September 25, 2007 11:13 AM   LEFT MESSAGE ON MACHINE ..................................................................Marland KitchenChrae Malloy  September 25, 2007 3:36 PM   LEFT MESSAGE ON MACHINE ..................................................................Marland KitchenChrae Malloy  September 26, 2007 8:29 AM   Spoke with patient, called home number she will pick-up ..................................................................Marland KitchenChrae Malloy  September 26, 2007 8:31 AM

## 2010-08-11 NOTE — Progress Notes (Signed)
Summary: Refill Request  Phone Note Refill Request Message from:  Pharmacy on [facility] Fax #: [phone]  Refills Requested: Medication #1:  AMARYL 2 MG TABS 1 by mouth daily..   Dosage confirmed as above?Dosage Confirmed   Brand Name Necessary? No   Supply Requested: 3 months   Notes: Glimepiride Tabs 2mg  Initial call taken by: Harold Barban,  August 29, 2009 8:19 AM    Prescriptions: AMARYL 2 MG TABS (GLIMEPIRIDE) 1 by mouth daily.  #90 x 0   Entered by:   Army Fossa CMA   Authorized by:   [name] DO   Signed by:   Army Fossa CMA on 08/29/2009   Method used:   Electronically to        [facility]* (mail-order)             ,          Ph: [phone]       Fax: [phone]   RxID:   [id number]

## 2010-08-11 NOTE — Progress Notes (Signed)
Summary: REFILL  Phone Note Refill Request Message from:  Fax from Pharmacy on September 01, 2009 12:38 PM  Refills Requested: Medication #1:  BENICAR 40 MG TABS Take 1 tablet by mouth once a day  Medication #2:  CRESTOR 20 MG  TABS 1 by mouth once daily VICTOZA PEN INJECTOR 3X3ML, 6MG /ML   Method Requested: Mail to Pharmacy Next Appointment Scheduled: NO APPT Initial call taken by: Barb Merino,  September 01, 2009 12:40 PM    Prescriptions: VICTOZA 18 MG/3ML SOLN (LIRAGLUTIDE) Use 1.8 mg daily  #3 x 1   Entered by:   Army Fossa CMA   Authorized by:   [name] DO   Signed by:   Army Fossa CMA on 09/01/2009   Method used:   Electronically to        [facility]* (mail-order)             ,          Ph: [phone]       Fax: [phone]   RxID:   [id number] CRESTOR 20 MG  TABS (ROSUVASTATIN CALCIUM) 1 by mouth once daily  #90 x 0   Entered by:   Army Fossa CMA   Authorized by:   [name] DO   Signed by:   Army Fossa CMA on 09/01/2009   Method used:   Electronically to        [facility]* (mail-order)             ,          Ph: [phone]       Fax: [phone]   RxID:   [id number] BENICAR 40 MG TABS (OLMESARTAN MEDOXOMIL) Take 1 tablet by mouth once a day  #90 x 0   Entered by:   Army Fossa CMA   Authorized by:   [name] DO   Signed by:   Army Fossa CMA on 09/01/2009   Method used:   Electronically to        [facility]* (mail-order)             ,          Ph: [phone]       Fax: [phone]   RxID:   [id number]

## 2010-08-11 NOTE — Progress Notes (Signed)
Summary: [name] FYI  Phone Note Call from Patient Call back at [facility] Phone [phone]   Caller: Patient Summary of Call: Patient has fibromyalgia and polyarthralgia from Dr. Link Snuffer dx yesterday.  Patient on 10mg  prednisone and worried about sugar.  Doesn't want to do insulin, wants to lose more weight,  would like to try byetta or something else beside's insulin.  Patient aware to keep eye on sugar only want's Dr. Laury Axon to address this issue.  Patient's sugar has run 112, 120 fasting.  Patient aware if sugar hits 140 or more patient is to call the office and we will need to work her in immediately. Ardyth Man  June 28, 2008 1:33 PM   Follow-up for Phone Call        If sugars start creeping up she will need insulin.  If she will be on prednisone long term we can discuss byetta. Follow-up by: [name] DO,  July 01, 2008 1:28 PM  Additional Follow-up for Phone Call Additional follow up Details #1::        Patient aware Ardyth Man  July 01, 2008 5:15 PM  Additional Follow-up by: Ardyth Man,  July 01, 2008 5:15 PM

## 2010-08-11 NOTE — Letter (Signed)
Summary: [facility]  [facility]   Imported By: Lanelle Bal 05/07/2009 12:10:32  _____________________________________________________________________  External Attachment:    Type:   Image     Comment:   External Document

## 2010-08-11 NOTE — Letter (Signed)
Summary: *Referral Letter  [facility]  [address] [place], [place] [address]   Phone: [phone]  Fax: [phone]    07/07/2009   Dr Luretha Murphy:   Thank you in advance for agreeing to see my patient:  [name] [address] 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) 2)  STRESS INCONTINENCE (ICD-788.39) 3)  ACUTE PHARYNGITIS (ICD-462) 4)  HEADACHE (ICD-784.0) 5)  COUGH (ICD-786.2) 6)  ACUTE SINUSITIS, UNSPECIFIED (ICD-461.9) 7)  BRONCHITIS, ACUTE (ICD-466.0) 8)  NAUSEA (ICD-787.02) 9)  DIARRHEA (ICD-787.91) 10)  MORBID OBESITY (ICD-278.01) 11)  PREVENTIVE HEALTH CARE (ICD-V70.0) 12)  SKIN RASH (ICD-782.1) 13)  TICK BITE (ICD-E906.4) 14)  FIBROMYALGIA (ICD-729.1) 15)  POLYARTHRALGIA (ICD-719.49) 16)  OTHER SPECIFIED ANEMIAS (ICD-285.8) 17)  UNSPECIFIED VITAMIN D DEFICIENCY (ICD-268.9) 18)  PAIN IN JOINT, MULTIPLE SITES (ICD-719.49) 19)  MUSCLE PAIN (ICD-729.1) 20)  BACK PAIN, LUMBAR (ICD-724.2) 21)  ACUTE SINUSITIS, UNSPECIFIED (ICD-461.9) 22)  CELLULITIS (ICD-682.9) 23)  DEPRESSION (ICD-311) 24)  ANXIETY (ICD-300.00) 25)  FAMILY HISTORY DIABETES 1ST DEGREE RELATIVE (ICD-V18.0) 26)  HYPERLIPIDEMIA NEC/NOS (ICD-272.4) 27)  HYPERTENSION, ESSENTIAL NOS (ICD-401.9) 28)  HEADACHE (ICD-784.0) 29)  DIABETES MELLITUS, TYPE II (ICD-250.00) 30)  ALLERGIC RHINITIS (ICD-477.9)   Current Medications: 1)  AMBIEN 10 MG TABS (ZOLPIDEM TARTRATE) Take 1 tablet by mouth once a day 2)  CELEXA 20 MG TABS (CITALOPRAM HYDROBROMIDE) 1 by mouth once daily 3)  BENICAR 40 MG TABS (OLMESARTAN MEDOXOMIL) Take 1 tablet by mouth once a day 4)  PREVACID 24HR 15 MG CPDR (LANSOPRAZOLE) Take 1 tablet by mouth once a day 5)  LORATADINE 10 MG TABS (LORATADINE) Take 1 tablet by mouth once a day 6)   LASIX 20 MG TABS (FUROSEMIDE) Take 2 tablet by mouth once a day 7)  VIVELLE-DOT 0.1 MG/24HR PTTW (ESTRADIOL) 1 patch 2x a week 8)  CRESTOR 20 MG  TABS (ROSUVASTATIN CALCIUM) 1 by mouth once daily 9)  KLOR-CON M10 10 MEQ  TBCR (POTASSIUM CHLORIDE CRYS CR) 1 by mouth once daily 10)  WELCHOL 625 MG  TABS (COLESEVELAM HCL) 6 qd 11)  VITAMIN D 34742 UNIT CAPS (ERGOCALCIFEROL) take 1 tab weekly 12)  VICTOZA 18 MG/3ML SOLN (LIRAGLUTIDE) Use 1.8 mg daily 13)  FREESTYLE LANCETS  MISC (LANCETS) PT TEST3 X A DAY 14)  * NOVAFINE 30 GAUGE NEEDLES PT TEST 3 X A DAY 15)  * FREESTYLE STRIPS PT TEST 3 X A DAY 16)  MUCINEX COLD FOR KIDS 2.5-100 MG/5ML LIQD (PHENYLEPHRINE-GUAIFENESIN) prn 17)  FLEXERIL 10 MG TABS (CYCLOBENZAPRINE HCL) prn 18)  VICODIN 5-500 MG TABS (HYDROCODONE-ACETAMINOPHEN) prn 19)  * LAB ORDERS 250.00 hep, lipid, bmp, hgba1c 20)  VITAMIN D3 2000 UNIT CAPS (CHOLECALCIFEROL) daily 21)  BD PEN NEEDLE MINI U/F 31G X 5 MM MISC (INSULIN PEN NEEDLE) AS DIRECTED 22)  METFORMIN HCL 500 MG TABS (METFORMIN HCL) 2 tabs two times a day 23)  METFORMIN HCL 500 MG XR24H-TAB (METFORMIN HCL) 4 qam 24)  PROMETHAZINE-CODEINE 6.25-10 MG/5ML SYRP (PROMETHAZINE-CODEINE) 1-2 tsp by mouth at bedtime as needed 25)  QVAR 80 MCG/ACT AERS (BECLOMETHASONE DIPROPIONATE) 2 puffs two times a day 26)  VAGIFEM 10 MCG TABS (ESTRADIOL) 1 pv 2x a week   Past Medical History: 1)  Allergic rhinitis 2)  Diabetes mellitus, type II 3)  Headache 4)  menopause 5)  gerd 6)  allergic rhinitis 7)  ibs 8)  Anxiety 9)  Depression 10)  Fibromyalgia 11)  Polyarthalgia   Prior History of Blood Transfusions: no (01/02/2009 1:07:02 PM [name] has done the research necessary so that she understands the risks involved in having the surgery and she understands the lifestyle changes needed before and after surgery.  She had attempted weight loss seveeral times and was unsuccessful in keeping the weight off.  See attached form.  she has  been coming in monthly to weigh and discuss diet. those records will be forwarded to your office.I believe [name] is an excellent candidate for lap band surgery.   Thank you again for agreeing to see our patient; please contact us if you have any further questions or need additional information.  Sincerely,  [name] DO

## 2010-08-11 NOTE — Assessment & Plan Note (Signed)
Summary: 1 MONTH ROA//[facility]   Vital Signs:  Patient profile:   55 year old female Height:      71 inches Weight:      231 pounds Temp:     98.6 degrees F oral Pulse rate:   82 / minute BP sitting:   140 / 78  (left arm)  Vitals Entered By: Jeremy Johann CMA (January 29, 2010 4:26 PM) CC: 1 MONTH F/U LAP BAND SURGERY   History of Present Illness: Pt here to f/u from Lap Band surgery ----  repeat hgba1c with [facility] 6.1.    Current Medications (verified): 1)  Ambien 10 Mg Tabs (Zolpidem Tartrate) .... Take 1 Tablet By Mouth Once A Day 2)  Celexa 20 Mg Tabs (Citalopram Hydrobromide) .Marland Kitchen.. 1 By Mouth Once Daily 3)  Benicar 40 Mg Tabs (Olmesartan Medoxomil) .... Take 1 Tablet By Mouth Once A Day 4)  Loratadine 10 Mg Tabs (Loratadine) .... Take 1 Tablet By Mouth Once A Day 5)  Lasix 20 Mg Tabs (Furosemide) .... Take 2 Tablet By Mouth Once A Day 6)  Vivelle-Dot 0.1 Mg/24hr Pttw (Estradiol) .Marland Kitchen.. 1 Patch 2x A Week 7)  Crestor 20 Mg  Tabs (Rosuvastatin Calcium) .Marland Kitchen.. 1 By Mouth Once Daily 8)  Klor-Con M10 10 Meq  Tbcr (Potassium Chloride Crys Cr) .Marland Kitchen.. 1 By Mouth Once Daily 9)  Victoza 18 Mg/16ml Soln (Liraglutide) .... Use 1.8 Mg Daily 10)  Novafine 30 Gauge Needles .... Pt Test 3 X A Day 11)  Flexeril 10 Mg Tabs (Cyclobenzaprine Hcl) .Marland Kitchen.. 1 By Mouth Three Times A Day As Needed 12)  Vicodin 5-500 Mg Tabs (Hydrocodone-Acetaminophen) .Marland Kitchen.. 1 By Mouth Every 6 Hours As Needed 13)  Bd Pen Needle Mini U/f 31g X 5 Mm Misc (Insulin Pen Needle) .... As Directed 14)  Qvar 80 Mcg/act Aers (Beclomethasone Dipropionate) .... 2 Puffs Two Times A Day As Needed 15)  Vagifem 10 Mcg Tabs (Estradiol) .Marland Kitchen.. 1 Pv 2x A Week 16)  Amaryl 2 Mg Tabs (Glimepiride) .Marland Kitchen.. 1 By Mouth Daily. 17)  Riomet 500 Mg/69ml Soln (Metformin Hcl) .... 2 Tsp Two Times A Day 18)  Accu-Chek Aviva  Strp (Glucose Blood) .... Test 3x Per Day As Directed 19)  Accu-Chek Aviva  Kit (Blood Glucose Monitoring Suppl) .... Use As Directed Daily 20)   Bariatric Advantage Crystals Multivitamin With Calsium .... Once Daily  As Directed  Allergies (verified): 1)  ! Septra 2)  ! Bactrim 3)  ! Macrodantin 4)  ! Morphine 5)  ! * Fentanyl 6)  ! Percocet 7)  Codeine Phosphate (Codeine Phosphate) 8)  * Inh 9)  * Ivp Dye 10)  Morphine Sulfate (Morphine Sulfate) 11)  Penicillin G Potassium (Penicillin G Potassium) 12)  Trimethoprim (Trimethoprim)  Past History:  Past medical, surgical, family and social histories (including risk factors) reviewed for relevance to current acute and chronic problems.  Past Medical History: Reviewed history from 10/13/2009 and no changes required. Allergic rhinitis Diabetes mellitus, type II Headache menopause gerd ibs Anxiety Depression Fibromyalgia Polyarthalgia  Past Surgical History: Caesarean section Cervical laminectomy Cholecystectomy GYN surgery Hysterectomy Tubal ligation Tonsillectomy Foot surgery Rt Knee Sinus surgery Lap Band surgery  Family History: Reviewed history from 11/04/2008 and no changes required. Family History Diabetes 1st degree relative (both parents) Mother, D, Stroke, Breast CA, A-fib, HTN, Diabetes Father,D, Stroke, HTn, Diabetes, Thyroid, Heart disese  Social History: Reviewed history from 11/04/2008 and no changes required. 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 Lungs are clear to auscultation, no crackles or wheezes. Heart:  Normal rate and regular rhythm. S1 and S2 normal without gallop, murmur, click, rub or other extra sounds. Extremities:  No clubbing, cyanosis, edema, or deformity noted with normal full range of motion of all joints.   Psych:  Cognition and judgment appear intact. Alert  and cooperative with normal attention span and concentration. No apparent delusions, illusions, hallucinations   Impression & Recommendations:  Problem # 1:  BARIATRIC SURGERY STATUS (ICD-V45.86)  Orders: Venipuncture (16109) TLB-B12 + Folate Pnl (60454_09811-B14/NWG) TLB-Lipid Panel (80061-LIPID) TLB-BMP (Basic Metabolic Panel-BMET) (80048-METABOL) TLB-CBC Platelet - w/Differential (85025-CBCD) TLB-Hepatic/Liver Function Pnl (80076-HEPATIC) TLB-TSH (Thyroid Stimulating Hormone) (84443-TSH) TLB-IBC Pnl (Iron/FE;Transferrin) (83550-IBC) TLB-Ferritin (82728-FER) T-Vitamin D (25-Hydroxy) (95621-30865)  Problem # 2:  HYPERLIPIDEMIA NEC/NOS (ICD-272.4)  The following medications were removed from the medication list:    Welchol 625 Mg Tabs (Colesevelam hcl) .Marland KitchenMarland KitchenMarland KitchenMarland Kitchen 6 qd Her updated medication list for this problem includes:    Crestor 20 Mg Tabs (Rosuvastatin calcium) .Marland Kitchen... 1 by mouth once daily  Orders: Venipuncture (78469) TLB-B12 + Folate Pnl (62952_84132-G40/NUU) TLB-Lipid Panel (80061-LIPID) TLB-BMP (Basic Metabolic Panel-BMET) (80048-METABOL) TLB-CBC Platelet - w/Differential (85025-CBCD) TLB-Hepatic/Liver Function Pnl (80076-HEPATIC) TLB-TSH (Thyroid Stimulating Hormone) (84443-TSH) TLB-IBC Pnl (Iron/FE;Transferrin) (83550-IBC) TLB-Ferritin (82728-FER) T-Vitamin D (25-Hydroxy) (72536-64403)  Problem # 3:  HYPERTENSION, ESSENTIAL NOS (ICD-401.9)  Her updated medication list for this problem includes:    Benicar 40 Mg Tabs (Olmesartan medoxomil) .Marland Kitchen... Take 1 tablet by mouth once a day    Lasix 20 Mg Tabs (Furosemide) .Marland Kitchen... Take 2 tablet by mouth once a day  Orders: Venipuncture (47425) TLB-B12 + Folate Pnl (95638_75643-P29/JJO) TLB-Lipid Panel (80061-LIPID) TLB-BMP (Basic Metabolic Panel-BMET) (80048-METABOL) TLB-CBC Platelet - w/Differential (85025-CBCD) TLB-Hepatic/Liver Function Pnl (80076-HEPATIC) TLB-TSH (Thyroid Stimulating Hormone) (84443-TSH) TLB-IBC Pnl  (Iron/FE;Transferrin) (83550-IBC) TLB-Ferritin (82728-FER) T-Vitamin D (25-Hydroxy) (84166-06301)  BP today: 140/78 Prior BP: 132/80 (12/25/2009)  Labs Reviewed: K+: 4.4 (06/30/2009) Creat: : 0.8 (06/30/2009)   Chol: 111 (06/30/2009)   HDL: 39.70 (06/30/2009)   LDL: 52 (06/30/2009)   TG: 97.0 (06/30/2009)  Problem # 4:  DIABETES MELLITUS, TYPE II (ICD-250.00)  The following medications were removed from the medication list:    Metformin Hcl 500 Mg Xr24h-tab (Metformin hcl) .Marland KitchenMarland KitchenMarland KitchenMarland Kitchen 4 qam Her updated medication list for this problem includes:    Benicar 40 Mg Tabs (Olmesartan medoxomil) .Marland Kitchen... Take 1 tablet by mouth once a day    Victoza 18 Mg/9ml Soln (Liraglutide) ..... Use 1.8 mg daily    Amaryl 2 Mg Tabs (Glimepiride) .Marland Kitchen... 1 by mouth daily.    Riomet 500 Mg/62ml Soln (Metformin hcl) .Marland Kitchen... 2 tsp two times a day  Orders: Venipuncture (60109) TLB-B12 + Folate Pnl (32355_73220-U54/YHC) TLB-Lipid Panel (80061-LIPID) TLB-BMP (Basic Metabolic Panel-BMET) (80048-METABOL) TLB-CBC Platelet - w/Differential (85025-CBCD) TLB-Hepatic/Liver Function Pnl (80076-HEPATIC) TLB-TSH (Thyroid Stimulating Hormone) (84443-TSH) TLB-IBC Pnl (Iron/FE;Transferrin) (83550-IBC) TLB-Ferritin (82728-FER) T-Vitamin D (25-Hydroxy) [id number]) TLB-A1C / Hgb A1C (Glycohemoglobin) (83036-A1C)  Labs Reviewed: Creat: 0.8 (06/30/2009)     Last Eye Exam: normal (11/27/2008) Reviewed HgBA1c results: 8.7 (06/30/2009)  7.5 (02/26/2009)  Complete Medication List: 1)  Ambien 10 Mg Tabs (Zolpidem tartrate) .... Take 1 tablet by mouth once a day 2)  Celexa 20 Mg Tabs (Citalopram hydrobromide) .Marland Kitchen.. 1 by mouth once daily 3)  Benicar 40 Mg Tabs (  Olmesartan medoxomil) .... Take 1 tablet by mouth once a day 4)  Loratadine 10 Mg Tabs (Loratadine) .... Take 1 tablet by mouth once a day 5)  Lasix 20 Mg Tabs (Furosemide) .... Take 2 tablet by mouth once a day 6)  Vivelle-dot 0.1 Mg/24hr Pttw (Estradiol) .Marland Kitchen.. 1 patch 2x a  week 7)  Crestor 20 Mg Tabs (Rosuvastatin calcium) .Marland Kitchen.. 1 by mouth once daily 8)  Klor-con M10 10 Meq Tbcr (Potassium chloride crys cr) .Marland Kitchen.. 1 by mouth once daily 9)  Victoza 18 Mg/4ml Soln (Liraglutide) .... Use 1.8 mg daily 10)  Novafine 30 Gauge Needles  .... Pt test 3 x a day 11)  Flexeril 10 Mg Tabs (Cyclobenzaprine hcl) .Marland Kitchen.. 1 by mouth three times a day as needed 12)  Vicodin 5-500 Mg Tabs (Hydrocodone-acetaminophen) .Marland Kitchen.. 1 by mouth every 6 hours as needed 13)  Bd Pen Needle Mini U/f 31g X 5 Mm Misc (Insulin pen needle) .... As directed 14)  Qvar 80 Mcg/act Aers (Beclomethasone dipropionate) .... 2 puffs two times a day as needed 15)  Vagifem 10 Mcg Tabs (Estradiol) .Marland Kitchen.. 1 pv 2x a week 16)  Amaryl 2 Mg Tabs (Glimepiride) .Marland Kitchen.. 1 by mouth daily. 17)  Riomet 500 Mg/69ml Soln (Metformin hcl) .... 2 tsp two times a day 18)  Accu-chek Aviva Strp (Glucose blood) .... Test 3x per day as directed 19)  Accu-chek Aviva Kit (Blood glucose monitoring suppl) .... Use as directed daily 20)  Bariatric Advantage Crystals Multivitamin With Calsium  .... Once daily  as directed

## 2010-08-11 NOTE — Letter (Signed)
Summary: Results Follow up Letter  [facility] [place]  [address] [place], [place] [address]   Phone: [phone]  Fax: [phone]    07/01/2009 MRN: [id number]  [name] [address] [place], [place]  [address]  Dear [name],  The following are the results of your recent test(s):  Test         Result    Pap Smear:        Normal _____  Not Normal _____ Comments: ______________________________________________________ Cholesterol: LDL(Bad cholesterol):         Your goal is less than:         HDL (Good cholesterol):       Your goal is more than: Comments:  ______________________________________________________ Mammogram:        Normal _____  Not Normal _____ Comments:  ___________________________________________________________________ Hemoccult:        Normal _____  Not normal _______ Comments:    _____________________________________________________________________ Other Tests:  See attachment for results.   We routinely do not discuss normal results over the telephone.  If you desire a copy of the results, or you have any questions about this information we can discuss them at your next office visit.   Sincerely,    Army Fossa CMA  July 01, 2009 1:36 PM

## 2010-08-11 NOTE — Progress Notes (Signed)
Summary: [facility]--MRI result  Phone Note Call from Patient Call back at [phone]   Caller: Patient Reason for Call: Lab or Test Results Summary of Call: Pt is calling about her MRI result. Initial call taken by: Freddy Jaksch,  February 16, 2008 12:18 PM  Follow-up for Phone Call        Patient aware of mri and xray Ardyth Man  February 16, 2008 1:08 PM and referral to neurosurgeon. Ardyth Man  February 16, 2008 1:08 PM  Follow-up by: Ardyth Man,  February 16, 2008 1:08 PM

## 2010-08-11 NOTE — Letter (Signed)
Summary: [facility]  [facility]   Imported By: Lanelle Bal 09/09/2009 10:29:04  _____________________________________________________________________  External Attachment:    Type:   Image     Comment:   External Document

## 2010-08-11 NOTE — Assessment & Plan Note (Signed)
Summary: NOT NEW,CPX,LABS/TL   Vital Signs:  Patient Profile:   55 Years Old Female Weight:      249.4 pounds Pulse rate:   72 / minute BP sitting:   134 / 82  (left arm)  Vitals Entered By: [name] (January 04, 2007 10:15 AM)               PCP:  Laury Axon  Chief Complaint:  FILL-OUT-FORM, DISCUSS LABS AND RX'S, and Type 2 diabetes mellitus follow-up.  History of Present Illness:  Type 2 Diabetes Mellitus Follow-Up      This is a 55 year old woman who presents for Type 2 diabetes mellitus follow-up.  The patient denies polyuria, polydipsia, blurred vision, self managed hypoglycemia, hypoglycemia requiring help, weight loss, weight gain, and numbness of extremities.  The patient denies the following symptoms: neuropathic pain, chest pain, vomiting, orthostatic symptoms, poor wound healing, intermittent claudication, vision loss, and foot ulcer.  Since the last visit the patient reports good dietary compliance, compliance with medications, exercising regularly, and monitoring blood glucose.  The patient has been measuring capillary blood glucose before breakfast, before lunch, before dinner, and at bedtime.    Hypertension Follow-Up      The patient also presents for Hypertension follow-up.  The patient denies lightheadedness, urinary frequency, headaches, edema, impotence, rash, and fatigue.  The patient denies the following associated symptoms: chest pain, chest pressure, exercise intolerance, dyspnea, palpitations, syncope, leg edema, and pedal edema.  Compliance with medications (by patient report) has been near 100%.  The patient reports that dietary compliance has been excellent.  The patient reports exercising daily.  Adjunctive measures currently used by the patient include salt restriction.    Hyperlipidemia Follow-Up      The patient also presents for Hyperlipidemia follow-up.  The patient denies muscle aches, GI upset, abdominal pain, flushing, itching, constipation, diarrhea,  and fatigue.  The patient denies the following symptoms: chest pain/pressure, exercise intolerance, dypsnea, palpitations, syncope, and pedal edema.  Compliance with medications (by patient report) has been near 100%.  Dietary compliance has been excellent.  The patient reports exercising daily.      Past Medical History:    Allergic rhinitis    Diabetes mellitus, type II    Headache     Review of Systems      See HPI   Physical Exam  General:     Well-developed,well-nourished,in no acute distress; alert,appropriate and cooperative throughout examination Neck:     No deformities, masses, or tenderness noted. Lungs:     Normal respiratory effort, chest expands symmetrically. Lungs are clear to auscultation, no crackles or wheezes. Heart:     Normal rate and regular rhythm. S1 and S2 normal without gallop, murmur, click, rub or other extra sounds. Extremities:     No clubbing, cyanosis, edema, or deformity noted with normal full range of motion of all joints.    Diabetes Management Exam:    Foot Exam (with socks and/or shoes not present):       Sensory-Pinprick/Light touch:          Left medial foot (L-4): normal          Left dorsal foot (L-5): normal          Left lateral foot (S-1): normal       Sensory-Monofilament:          Left foot: normal       Inspection:          Left foot: normal  Nails:          Left foot: normal    Impression & Recommendations:  Problem # 1:  DIABETES MELLITUS, TYPE II (ICD-250.00) discussed labs Her updated medication list for this problem includes:    Actos 45 Mg Tabs (Pioglitazone hcl) .Marland Kitchen... 1 tablet by mouth once a day    Benicar 40 Mg Tabs (Olmesartan medoxomil) .Marland Kitchen... Take 1 tablet by mouth once a day    Fortamet 1000 Mg Tb24 (Metformin hcl) .Marland Kitchen... Take 2 tablet by mouth at bedtime    Januvia 100 Mg Tabs (Sitagliptin phosphate) .Marland Kitchen... 1 by mouth once daily    Januvia 100 Mg Tabs (Sitagliptin phosphate) .Marland Kitchen... 1 by mouth once  daily  Labs Reviewed: HgBA1c: 7.2 (12/21/2006)   Creat: 0.7 (12/21/2006)      Problem # 2:  HYPERLIPIDEMIA NEC/NOS (ICD-272.4) discussed NMR Her updated medication list for this problem includes:    Crestor 20 Mg Tabs (Rosuvastatin calcium) .Marland Kitchen... 1 by mouth once daily    Crestor 20 Mg Tabs (Rosuvastatin calcium) .Marland Kitchen... 1 by mouth once daily  Labs Reviewed: Chol: 146 (10/12/2006)   HDL: 38.0 (10/12/2006)   LDL: 90 (10/12/2006)   TG: 90 (10/12/2006) SGOT: 18 (12/21/2006)   SGPT: 32 (12/21/2006)   Problem # 3:  HYPERTENSION, ESSENTIAL NOS (ICD-401.9)  Her updated medication list for this problem includes:    Benicar 40 Mg Tabs (Olmesartan medoxomil) .Marland Kitchen... Take 1 tablet by mouth once a day    Lasix 20 Mg Tabs (Furosemide) .Marland Kitchen... Take 2 tablet by mouth once a day  BP today: 134/82  Labs Reviewed: Creat: 0.7 (12/21/2006) Chol: 146 (10/12/2006)   HDL: 38.0 (10/12/2006)   LDL: 90 (10/12/2006)   TG: 90 (10/12/2006)   Medications Added to Medication List This Visit: 1)  Crestor 20 Mg Tabs (Rosuvastatin calcium) .Marland Kitchen.. 1 by mouth once daily 2)  Januvia 100 Mg Tabs (Sitagliptin phosphate) .Marland Kitchen.. 1 by mouth once daily                                                                                                                                                                                                              Prescriptions: JANUVIA 100 MG  TABS (SITAGLIPTIN PHOSPHATE) 1 by mouth once daily  #90 x 3   Entered and Authorized by:   [name] DO   Signed by:   [name] DO on 01/04/2007   Method used:   Print then Give to Patient   RxID:   [id number] CRESTOR 20 MG  TABS (ROSUVASTATIN  CALCIUM) 1 by mouth once daily  #90 x 3   Entered and Authorized by:   [name] DO   Signed by:   [name] DO on 01/04/2007   Method used:   Print then Give to Patient   RxID:   [id number]

## 2010-08-11 NOTE — Progress Notes (Signed)
Summary: refill - dr Laury Axon  Phone Note Call from Patient Call back at Work Phone [phone]   Caller: Patient Summary of Call: vivelle-dot 0.1 mg - 90 day supply --- 3 refills for mail order ---- patient will pick up Initial call taken by: Okey Regal [place],  September 25, 2008 10:09 AM      Prescriptions: VIVELLE-DOT 0.1 MG/24HR PTTW (ESTRADIOL)   #24 x 3   Entered by:   Kandice Hams   Authorized by:   [name] DO   Signed by:   Kandice Hams on 09/25/2008   Method used:   Print then Give to Patient   RxID:   [id number]

## 2010-08-13 NOTE — Progress Notes (Signed)
  Phone Note Outgoing Call   Call placed by: Clemens Catholic LPN,  August 02, 2010 1:48 PM Call placed to: Patient Summary of Call: call back: pt states that she felt some better last night, pain came back today but not as bad. advised pt to continue meds as prescribed and come back to the clinic tomorrow for a repeat CBC. pt agrees. Initial call taken by: Clemens Catholic LPN,  August 02, 2010 1:49 PM

## 2010-08-13 NOTE — Progress Notes (Signed)
Summary: low BS  Phone Note Call from Patient Call back at Work Phone [phone]   Caller: Patient Summary of Call: Pt left VM that she has recently loss 52# and her BS have been running in the 50 so she has been eating thing to increase BS which then puts BS in 200.   Spoke with Pt, Pt c/o being lightheaded when BS drops. Pt denies being symptomatic now only when BS drop. Pt states that BS was 57 at 12:30am today. Pt notes that Fasting BS run between 70-90 and 2 hours after meals around 110. Pt would like to know if it would be possible to D/C one of the DM med since BS are dropping so low. Pt has pending OV on 07-10-10 to discuss further with you as well. Pt currently taking VICTOZA 18 MG/3ML SOLN Use 1.8 mg daily and AMARYL 2 MG TABS 1 by mouth daily. Pls advise.........Marland KitchenFelecia Deloach CMA  June 25, 2010 11:52 AM   Follow-up for Phone Call        stop amaryl ----if still running low in a few days ---d/c victoza Follow-up by: [name] DO,  June 25, 2010 1:18 PM  Additional Follow-up for Phone Call Additional follow up Details #1::        Left message to call back with a woman who answered the phone... Almeta Monas CMA Duncan Dull)  June 25, 2010 3:47 PM   Pt aware will D/C med and monitor BS..........Marland KitchenFelecia Deloach CMA  June 26, 2010 11:50 AM

## 2010-08-13 NOTE — Progress Notes (Signed)
Summary: Glucose Log Brought by Patient  Glucose Log Brought by Patient   Imported By: Lanelle Bal 07/20/2010 09:18:54  _____________________________________________________________________  External Attachment:    Type:   Image     Comment:   External Document

## 2010-08-13 NOTE — Assessment & Plan Note (Signed)
Summary: Low BS//fd   Vital Signs:  Patient profile:   55 year old female Weight:      210.8 pounds Pulse rate:   80 / minute Pulse rhythm:   regular BP sitting:   114 / 62  (left arm) Cuff size:   large  Vitals Entered By: Almeta Monas CMA Duncan Dull) (July 10, 2010 9:56 AM) CC: c/o BS being low   History of Present Illness: Pt here c/oBS and BP running low.   Pt stopped amaryl 12/16 and BS still running low.  See home BS readings.   No other complaints.  Current Medications (verified): 1)  Ambien 10 Mg Tabs (Zolpidem Tartrate) .... Take 1 Tablet By Mouth Once A Day 2)  Celexa 20 Mg Tabs (Citalopram Hydrobromide) .Marland Kitchen.. 1 By Mouth Once Daily 3)  Benicar 20 Mg Tabs (Olmesartan Medoxomil) .Marland Kitchen.. 1 By Mouth Qd 4)  Loratadine 10 Mg Tabs (Loratadine) .... Take 1 Tablet By Mouth Once A Day 5)  Vivelle-Dot 0.1 Mg/24hr Pttw (Estradiol) .Marland Kitchen.. 1 Patch 2x A Week 6)  Crestor 20 Mg  Tabs (Rosuvastatin Calcium) .Marland Kitchen.. 1 By Mouth Once Daily 7)  Klor-Con M10 10 Meq  Tbcr (Potassium Chloride Crys Cr) .Marland Kitchen.. 1 By Mouth Once Daily 8)  Victoza 18 Mg/71ml Soln (Liraglutide) .... Use 1.2 Mg Daily 9)  Novafine 30 Gauge Needles .... Pt Test 3 X A Day 10)  Flexeril 10 Mg Tabs (Cyclobenzaprine Hcl) .Marland Kitchen.. 1 By Mouth Three Times A Day As Needed 11)  Vicodin 5-500 Mg Tabs (Hydrocodone-Acetaminophen) .Marland Kitchen.. 1 By Mouth Every 6 Hours As Needed 12)  Bd Pen Needle Mini U/f 31g X 5 Mm Misc (Insulin Pen Needle) .... As Directed 13)  Qvar 80 Mcg/act Aers (Beclomethasone Dipropionate) .... 2 Puffs Two Times A Day As Needed 14)  Vagifem 10 Mcg Tabs (Estradiol) .Marland Kitchen.. 1 Pv 2x A Week 15)  Accu-Chek Aviva  Strp (Glucose Blood) .... Test 3x Per Day As Directed 16)  Accu-Chek Aviva  Kit (Blood Glucose Monitoring Suppl) .... Use As Directed Daily 17)  Bariatric Advantage Crystals Multivitamin With Calsium .... Once Daily  As Directed 18)  Bd Eclipse Syringe 25g X 5/8" 3 Ml Misc (Syringe/needle (Disp)) .... As Directed 19)  Avia Lancets  .... Accu Check Three Times A Day  Allergies (verified): 1)  ! Septra 2)  ! Bactrim 3)  ! Macrodantin 4)  ! Morphine 5)  ! * Fentanyl 6)  ! Percocet 7)  Codeine Phosphate (Codeine Phosphate) 8)  * Inh 9)  * Ivp Dye 10)  Morphine Sulfate (Morphine Sulfate) 11)  Penicillin G Potassium (Penicillin G Potassium) 12)  Trimethoprim (Trimethoprim)  Past History:  Past Medical History: Last updated: 10/13/2009 Allergic rhinitis Diabetes mellitus, type II Headache menopause gerd ibs Anxiety Depression Fibromyalgia Polyarthalgia  Past Surgical History: Last updated: 01/29/2010 Caesarean section Cervical laminectomy Cholecystectomy GYN surgery Hysterectomy Tubal ligation Tonsillectomy Foot surgery Rt Knee Sinus surgery Lap Band surgery  Family History: Last updated: 11/04/2008 Family History Diabetes 1st degree relative (both parents) Mother, D, Stroke, Breast CA, A-fib, HTN, Diabetes Father,D, Stroke, HTn, Diabetes, Thyroid, Heart disese  Social History: Last updated: 11/04/2008 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 Family History Diabetes 1st degree relative (both parents) Mother, D, Stroke, Breast CA, A-fib, HTN, Diabetes Father,D,  Stroke, HTn, Diabetes, Thyroid, Heart disese  Social History: Reviewed history from 11/04/2008 and no changes required. 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 Lungs:  Normal respiratory effort, chest expands symmetrically.  Lungs are clear to auscultation, no crackles or wheezes. Heart:  Normal rate and regular rhythm. S1 and S2 normal without gallop, murmur, click, rub or other extra sounds. Psych:  Oriented X3 and normally interactive.     Impression & Recommendations:  Problem # 1:  BARIATRIC SURGERY STATUS (ICD-V45.86)  Problem # 2:  HYPERTENSION, ESSENTIAL NOS (ICD-401.9) Assessment: Improved  The following medications were removed from the medication list:    Lasix 20 Mg Tabs (Furosemide) .Marland Kitchen... Take 2 tablet by mouth once a day Her updated medication list for this problem includes:    Benicar 20 Mg Tabs (Olmesartan medoxomil) .Marland Kitchen... 1 by mouth qd  BP today: 114/62 Prior BP: 130/80 (04/01/2010)  Labs Reviewed: K+: 4.4 (01/29/2010) Creat: : 1.0 (01/29/2010)   Chol: 155 (01/29/2010)   HDL: 38.50 (01/29/2010)   LDL: 87 (01/29/2010)   TG: 147.0 (01/29/2010)  Problem # 3:  DIABETES MELLITUS, TYPE II (ICD-250.00) Assessment: Improved  Her updated medication list for this problem includes:    Benicar 20 Mg Tabs (Olmesartan medoxomil) .Marland Kitchen... 1 by mouth qd    Victoza 18 Mg/28ml Soln (Liraglutide) ..... Use 1.2 mg daily  Labs Reviewed: Creat: 1.0 (01/29/2010)     Last Eye Exam: normal (11/27/2008) Reviewed HgBA1c results: 6.6 (01/29/2010)  8.7 (06/30/2009)  Complete Medication List: 1)  Ambien 10 Mg Tabs (Zolpidem tartrate) .... Take 1 tablet by mouth once a day 2)  Celexa 20 Mg Tabs (Citalopram hydrobromide) .Marland Kitchen.. 1 by mouth once daily 3)  Benicar 20 Mg Tabs (Olmesartan medoxomil) .Marland Kitchen.. 1 by mouth qd 4)  Loratadine 10 Mg Tabs (Loratadine) .... Take 1 tablet by mouth once a day 5)  Vivelle-dot 0.1 Mg/24hr Pttw (Estradiol) .Marland Kitchen.. 1 patch 2x a week 6)  Crestor 20 Mg Tabs (Rosuvastatin calcium) .Marland Kitchen.. 1 by mouth once daily 7)  Klor-con M10 10 Meq Tbcr (Potassium chloride crys cr) .Marland Kitchen.. 1 by mouth once daily 8)  Victoza 18 Mg/27ml Soln (Liraglutide) .... Use 1.2 mg daily 9)  Novafine 30 Gauge Needles  .... Pt  test 3 x a day 10)  Flexeril 10 Mg Tabs (Cyclobenzaprine hcl) .Marland Kitchen.. 1 by mouth three times a day as needed 11)  Vicodin 5-500 Mg Tabs (Hydrocodone-acetaminophen) .Marland Kitchen.. 1 by mouth every 6 hours as needed 12)  Bd Pen Needle Mini U/f 31g X 5 Mm Misc (Insulin pen needle) .... As directed 13)  Qvar 80 Mcg/act Aers (Beclomethasone dipropionate) .... 2 puffs two times a day as needed 14)  Vagifem 10 Mcg Tabs (Estradiol) .Marland Kitchen.. 1 pv 2x a week 15)  Accu-chek Aviva Strp (Glucose blood) .... Test 3x per day as directed 16)  Accu-chek Aviva Kit (Blood glucose monitoring suppl) .... Use as directed daily 17)  Bariatric Advantage Crystals Multivitamin With Calsium  .... Once daily  as directed 18)  Bd Eclipse Syringe 25g X 5/8" 3 Ml Misc (Syringe/needle (disp)) .... As directed 19)  Avia Lancets  .... Accu check three times a day  Patient Instructions: 1)  fasting labs  250.00  272.4   401.9 , V45.86  lipid, hep, bmp, hgba1c,  microalbumin , cbcd, ibc 2)  decrease victoza to 1.2 mg daily 3)  decrease benicar 20 mg once daily    Orders Added: 1)  Est. Patient Level III [04540]

## 2010-08-13 NOTE — Assessment & Plan Note (Signed)
Summary: UTI?/TM room 4   Vital Signs:  Patient Profile:   55 Years Old Female CC:      possible UTI Height:     71 inches Weight:      212.50 pounds O2 Sat:      99 % O2 treatment:    Room Air Temp:     98.6 degrees F oral Pulse rate:   74 / minute Resp:     18 per minute BP sitting:   135 / 84  (left arm) Cuff size:   regular  Vitals Entered By: Clemens Catholic LPN (August 01, 2010 3:28 PM)                  Updated Prior Medication List: BENICAR 20 MG TABS (OLMESARTAN MEDOXOMIL) 1 by mouth qd LORATADINE 10 MG TABS (LORATADINE) Take 1 tablet by mouth once a day VIVELLE-DOT 0.1 MG/24HR PTTW (ESTRADIOL) 1 patch 2x a week CRESTOR 20 MG  TABS (ROSUVASTATIN CALCIUM) 1 by mouth once daily VICTOZA 18 MG/3ML SOLN (LIRAGLUTIDE) Use 1.2 mg daily * NOVAFINE 30 GAUGE NEEDLES PT TEST 3 X A DAY FLEXERIL 10 MG TABS (CYCLOBENZAPRINE HCL) 1 by mouth three times a day as needed BD PEN NEEDLE MINI U/F 31G X 5 MM MISC (INSULIN PEN NEEDLE) AS DIRECTED ACCU-CHEK AVIVA  STRP (GLUCOSE BLOOD) test 3x per day as directed ACCU-CHEK AVIVA  KIT (BLOOD GLUCOSE MONITORING SUPPL) use as directed daily * BARIATRIC ADVANTAGE CRYSTALS MULTIVITAMIN WITH CALSIUM once daily  AS DIRECTED BD ECLIPSE SYRINGE 25G X 5/8" 3 ML MISC (SYRINGE/NEEDLE (DISP)) as directed * AVIA LANCETS accu check three times a day  Current Allergies (reviewed today): ! SEPTRA ! BACTRIM ! MACRODANTIN ! MORPHINE ! * FENTANYL ! PERCOCET CODEINE PHOSPHATE (CODEINE PHOSPHATE) * INH * IVP DYE MORPHINE SULFATE (MORPHINE SULFATE) PENICILLIN G POTASSIUM (PENICILLIN G POTASSIUM) TRIMETHOPRIM (TRIMETHOPRIM)History of Present Illness Chief Complaint: possible UTI History of Present Illness:  Subjective:  Patient complains of two day history of sharp, stabbing lower abdominal, suprapubic pain.  The pain does not radiate.  She noted some loose stools yesterday.  She had chills this morning.  No urinary symptoms.  No nausea/vomiting.     Past history includes C-section, complete hysterectomy (fibroids), and a lap-band procedure 7 months ago.  In 2008 she had had an episode of lower abdominal pain that was determined to be mesenteric lymphadenitis.  Subsequent colonoscopy revealed diverticulosis.  REVIEW OF SYSTEMS Constitutional Symptoms       Complains of chills.     Denies fever, night sweats, weight loss, weight gain, and fatigue.  Eyes       Denies change in vision, eye pain, eye discharge, glasses, contact lenses, and eye surgery. Ear/Nose/Throat/Mouth       Denies hearing loss/aids, change in hearing, ear pain, ear discharge, dizziness, frequent runny nose, frequent nose bleeds, sinus problems, sore throat, hoarseness, and tooth pain or bleeding.  Respiratory       Denies dry cough, productive cough, wheezing, shortness of breath, asthma, bronchitis, and emphysema/COPD.  Cardiovascular       Denies murmurs, chest pain, and tires easily with exhertion.    Gastrointestinal       Complains of stomach pain.      Denies nausea/vomiting, diarrhea, constipation, blood in bowel movements, and indigestion.      Comments: nausea Genitourniary       Denies painful urination, kidney stones, and loss of urinary control. Neurological       Denies paralysis,  seizures, and fainting/blackouts. Musculoskeletal       Denies muscle pain, joint pain, joint stiffness, decreased range of motion, redness, swelling, muscle weakness, and gout.  Skin       Denies bruising, unusual mles/lumps or sores, and hair/skin or nail changes.  Psych       Denies mood changes, temper/anger issues, anxiety/stress, speech problems, depression, and sleep problems. Other Comments: pt states she has had lower abd pain,chills x thurs PM. she had diarrhea yesterday off and on. urinary freq x today.no fever.   Past History:  Past Medical History: Reviewed history from 10/13/2009 and no changes required. Allergic rhinitis Diabetes mellitus, type  II Headache menopause gerd ibs Anxiety Depression Fibromyalgia Polyarthalgia  Past Surgical History: Reviewed history from 01/29/2010 and no changes required. Caesarean section Cervical laminectomy Cholecystectomy GYN surgery Hysterectomy Tubal ligation Tonsillectomy Foot surgery Rt Knee Sinus surgery Lap Band surgery  Family History: Reviewed history from 11/04/2008 and no changes required. Family History Diabetes 1st degree relative (both parents) Mother, D, Stroke, Breast CA, A-fib, HTN, Diabetes Father,D, Stroke, HTn, Diabetes, Thyroid, Heart disese  Social History: Reviewed history from 11/04/2008 and no changes required. 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  Extraocular movement is intact.  Conjunctivae are not inflamed.  Ears:  Canals normal.  Tympanic membranes normal.   Nose:  Clear Pharynx:  Normal; moist mucous membranes  Neck:  Supple.  No adenopathy is present.   Lungs:  Clear to auscultation.  Breath sounds are equal.  Heart:  Regular rate and rhythm without murmurs, rubs, or gallops.  Abdomen:  Multiple surgical scars.   No masses or hepatosplenomegaly.  Bowel sounds are present and somewhat hyperactive.  No CVA or flank tenderness.  There is tenderness in the suprapubic area in the midline of a bikini surgical scar.  The tenderness is accentuated with valsalva but no definite hernia is palpated.  Tenderness extends to the ascending and descending colon.  No rebound tenderness. urinalysis (dipstick):  negative CBC:  WBC12.2; 18.9 LY, 3.0 MO, 78.1 GR  Assessment New Problems: ABDOMINAL PAIN, ACUTE (ICD-789.00)  ? DIVERTICULITIS; ? INCISIONAL HERNIA.  Plan New Medications/Changes: METRONIDAZOLE 500 MG TABS (METRONIDAZOLE) One by mouth  q6hr  #28 x 0, 08/01/2010, Donna Christen MD CIPROFLOXACIN HCL 750 MG TABS (CIPROFLOXACIN HCL) One by mouth two times a day  #14 x 0, 08/01/2010, Donna Christen MD  New Orders: Est. Patient Level IV [91478] Urinalysis [CPT-81003] CBC w/Diff [[id number]] Planning Comments:   Will empirically treat for diverticulitis.  Begin clear liquids through tomorrow.  Begin Cipro and Flagyl.  Tylenol for pain.  Return in 24 to 48 hours for re-check and repeat CBC.   If symptoms persist will neet CT scan abd/pelvis. If symptoms become significantly worse during the night or over the weekend, proceed to the local emergency room.   The patient and/or caregiver has been counseled thoroughly with regard to medications prescribed including dosage, schedule, interactions, rationale for use, and possible side effects and they verbalize understanding.  Diagnoses and expected course of recovery discussed and will return if not improved as expected or if the condition worsens. Patient and/or caregiver verbalized understanding.  Prescriptions: METRONIDAZOLE 500 MG TABS (METRONIDAZOLE) One by mouth q6hr  #28 x 0   Entered and Authorized by:  Donna Christen MD   Signed by:   Donna Christen MD on 08/01/2010   Method used:   Print then Give to Patient   RxID:   [id number] CIPROFLOXACIN HCL 750 MG TABS (CIPROFLOXACIN HCL) One by mouth two times a day  #14 x 0   Entered and Authorized by:   Donna Christen MD   Signed by:   Donna Christen MD on 08/01/2010   Method used:   Print then Give to Patient   RxID:   [id number]   Orders Added: 1)  Est. Patient Level IV [60109] 2)  Urinalysis [CPT-81003] 3)  CBC w/Diff [[id number]]    Laboratory Results   Urine Tests  Date/Time Received: August 01, 2010 3:52 PM  Date/Time Reported: August 01, 2010 3:52 PM   Routine Urinalysis   Color: lt. yellow Appearance: Clear Glucose: negative   (Normal Range: Negative) Bilirubin: negative   (Normal Range:  Negative) Ketone: negative   (Normal Range: Negative) Spec. Gravity: <1.005   (Normal Range: 1.003-1.035) Blood: negative   (Normal Range: Negative) pH: 5.5   (Normal Range: 5.0-8.0) Protein: negative   (Normal Range: Negative) Urobilinogen: 0.2   (Normal Range: 0-1) Nitrite: negative   (Normal Range: Negative) Leukocyte Esterace: negative   (Normal Range: Negative)

## 2010-08-14 ENCOUNTER — Encounter: Payer: Self-pay | Admitting: Family Medicine

## 2010-08-14 ENCOUNTER — Other Ambulatory Visit: Payer: Self-pay | Admitting: Surgery

## 2010-08-19 ENCOUNTER — Other Ambulatory Visit: Payer: Self-pay

## 2010-08-19 ENCOUNTER — Ambulatory Visit (HOSPITAL_COMMUNITY)
Admission: RE | Admit: 2010-08-19 | Discharge: 2010-08-19 | Disposition: A | Discharge status: Home / Self Care | Payer: 59 | Source: Ambulatory Visit | Attending: Surgery | Admitting: Surgery

## 2010-08-19 DIAGNOSIS — K5730 Diverticulosis of large intestine without perforation or abscess without bleeding: Secondary | ICD-10-CM | POA: Insufficient documentation

## 2010-08-19 DIAGNOSIS — Z9884 Bariatric surgery status: Secondary | ICD-10-CM | POA: Insufficient documentation

## 2010-08-19 DIAGNOSIS — R1032 Left lower quadrant pain: Secondary | ICD-10-CM | POA: Insufficient documentation

## 2010-08-19 DIAGNOSIS — M47817 Spondylosis without myelopathy or radiculopathy, lumbosacral region: Secondary | ICD-10-CM | POA: Insufficient documentation

## 2010-08-19 DIAGNOSIS — M47814 Spondylosis without myelopathy or radiculopathy, thoracic region: Secondary | ICD-10-CM | POA: Insufficient documentation

## 2010-08-19 MED ORDER — IOHEXOL 300 MG/ML  SOLN: 100.0000 mL | Freq: Once | INTRAMUSCULAR | Status: AC | PRN

## 2010-08-19 NOTE — Letter (Signed)
Summary: [facility]  [facility]   Imported By: Lanelle Bal 08/13/2010 09:27:23  _____________________________________________________________________  External Attachment:    Type:   Image     Comment:   External Document

## 2010-09-02 NOTE — Letter (Signed)
Summary: [facility]   Imported By: Maryln Gottron 08/25/2010 10:07:03  _____________________________________________________________________  External Attachment:    Type:   Image     Comment:   External Document

## 2010-09-27 LAB — DIFFERENTIAL
Basophils Absolute: 0 10*3/uL (ref 0.0–0.1)
Basophils Relative: 0 % (ref 0–1)
Eosinophils Absolute: 0 10*3/uL (ref 0.0–0.7)
Eosinophils Relative: 0 % (ref 0–5)
Lymphocytes Relative: 12 % (ref 12–46)
Lymphs Abs: 1.6 10*3/uL (ref 0.7–4.0)
Monocytes Absolute: 0.6 10*3/uL (ref 0.1–1.0)
Monocytes Relative: 5 % (ref 3–12)
Neutro Abs: 10.6 10*3/uL — ABNORMAL HIGH (ref 1.7–7.7)
Neutrophils Relative %: 83 % — ABNORMAL HIGH (ref 43–77)

## 2010-09-27 LAB — CBC
HCT: 36.1 % (ref 36.0–46.0)
Hemoglobin: 11.5 g/dL — ABNORMAL LOW (ref 12.0–15.0)
MCHC: 31.9 g/dL (ref 30.0–36.0)
MCV: 84.2 fL (ref 78.0–100.0)
Platelets: 262 10*3/uL (ref 150–400)
RBC: 4.29 MIL/uL (ref 3.87–5.11)
RDW: 14.4 % (ref 11.5–15.5)
WBC: 12.9 10*3/uL — ABNORMAL HIGH (ref 4.0–10.5)

## 2010-09-27 LAB — GLUCOSE, CAPILLARY
Glucose-Capillary: 114 mg/dL — ABNORMAL HIGH (ref 70–99)
Glucose-Capillary: 117 mg/dL — ABNORMAL HIGH (ref 70–99)
Glucose-Capillary: 159 mg/dL — ABNORMAL HIGH (ref 70–99)
Glucose-Capillary: 183 mg/dL — ABNORMAL HIGH (ref 70–99)
Glucose-Capillary: 190 mg/dL — ABNORMAL HIGH (ref 70–99)
Glucose-Capillary: 208 mg/dL — ABNORMAL HIGH (ref 70–99)
Glucose-Capillary: 230 mg/dL — ABNORMAL HIGH (ref 70–99)
Glucose-Capillary: 251 mg/dL — ABNORMAL HIGH (ref 70–99)

## 2010-09-27 LAB — HEMOGLOBIN AND HEMATOCRIT, BLOOD
HCT: 35.8 % — ABNORMAL LOW (ref 36.0–46.0)
Hemoglobin: 11.5 g/dL — ABNORMAL LOW (ref 12.0–15.0)

## 2010-09-28 ENCOUNTER — Encounter: Payer: 59 | Attending: Surgery | Admitting: *Deleted

## 2010-09-28 DIAGNOSIS — Z9884 Bariatric surgery status: Secondary | ICD-10-CM | POA: Insufficient documentation

## 2010-09-28 DIAGNOSIS — Z09 Encounter for follow-up examination after completed treatment for conditions other than malignant neoplasm: Secondary | ICD-10-CM | POA: Insufficient documentation

## 2010-09-28 DIAGNOSIS — Z713 Dietary counseling and surveillance: Secondary | ICD-10-CM | POA: Insufficient documentation

## 2010-09-28 LAB — DIFFERENTIAL
Basophils Absolute: 0.1 10*3/uL (ref 0.0–0.1)
Basophils Relative: 1 % (ref 0–1)
Eosinophils Absolute: 0.3 10*3/uL (ref 0.0–0.7)
Eosinophils Relative: 2 % (ref 0–5)
Lymphocytes Relative: 29 % (ref 12–46)
Lymphs Abs: 3.6 10*3/uL (ref 0.7–4.0)
Monocytes Absolute: 0.8 10*3/uL (ref 0.1–1.0)
Monocytes Relative: 7 % (ref 3–12)
Neutro Abs: 7.6 10*3/uL (ref 1.7–7.7)
Neutrophils Relative %: 62 % (ref 43–77)

## 2010-09-28 LAB — CBC
HCT: 38 % (ref 36.0–46.0)
Hemoglobin: 12.3 g/dL (ref 12.0–15.0)
MCHC: 32.3 g/dL (ref 30.0–36.0)
MCV: 84.8 fL (ref 78.0–100.0)
Platelets: 305 10*3/uL (ref 150–400)
RBC: 4.48 MIL/uL (ref 3.87–5.11)
RDW: 14.4 % (ref 11.5–15.5)
WBC: 12.3 10*3/uL — ABNORMAL HIGH (ref 4.0–10.5)

## 2010-09-28 LAB — COMPREHENSIVE METABOLIC PANEL
ALT: 26 U/L (ref 0–35)
AST: 24 U/L (ref 0–37)
Albumin: 4.1 g/dL (ref 3.5–5.2)
Alkaline Phosphatase: 91 U/L (ref 39–117)
BUN: 7 mg/dL (ref 6–23)
CO2: 28 mEq/L (ref 19–32)
Calcium: 9.3 mg/dL (ref 8.4–10.5)
Chloride: 105 mEq/L (ref 96–112)
Creatinine, Ser: 0.81 mg/dL (ref 0.4–1.2)
GFR calc Af Amer: >60 mL/min (ref >60)
GFR calc non Af Amer: >60 mL/min (ref >60)
Glucose, Bld: 126 mg/dL — ABNORMAL HIGH (ref 70–99)
Potassium: 4.2 mEq/L (ref 3.5–5.1)
Sodium: 137 mEq/L (ref 135–145)
Total Bilirubin: 0.6 mg/dL (ref 0.3–1.2)
Total Protein: 7.5 g/dL (ref 6.0–8.3)

## 2010-09-28 LAB — SURGICAL PCR SCREEN
MRSA, PCR: NEGATIVE
Staphylococcus aureus: NEGATIVE

## 2010-10-30 ENCOUNTER — Encounter: Payer: Self-pay | Admitting: Family Medicine

## 2010-10-30 ENCOUNTER — Ambulatory Visit: Payer: 59 | Admitting: Family Medicine

## 2010-11-03 ENCOUNTER — Encounter: Payer: Self-pay | Admitting: Family Medicine

## 2010-11-03 ENCOUNTER — Other Ambulatory Visit: Payer: Self-pay | Admitting: *Deleted

## 2010-11-03 ENCOUNTER — Other Ambulatory Visit: Payer: Self-pay

## 2010-11-03 ENCOUNTER — Ambulatory Visit (INDEPENDENT_AMBULATORY_CARE_PROVIDER_SITE_OTHER): Payer: 59 | Admitting: Family Medicine

## 2010-11-03 VITALS — BP 114/62 | HR 68 | Wt 205.8 lb

## 2010-11-03 DIAGNOSIS — I10 Essential (primary) hypertension: Secondary | ICD-10-CM

## 2010-11-03 DIAGNOSIS — E119 Type 2 diabetes mellitus without complications: Secondary | ICD-10-CM

## 2010-11-03 DIAGNOSIS — E785 Hyperlipidemia, unspecified: Secondary | ICD-10-CM

## 2010-11-03 LAB — BASIC METABOLIC PANEL
BUN: 11 mg/dL (ref 6–23)
CO2: 31 mEq/L (ref 19–32)
Calcium: 9.4 mg/dL (ref 8.4–10.5)
Chloride: 99 mEq/L (ref 96–112)
Creatinine, Ser: 1 mg/dL (ref 0.4–1.2)
GFR: 64.41 mL/min (ref >60.00)
Glucose, Bld: 85 mg/dL (ref 70–99)
Potassium: 3.9 mEq/L (ref 3.5–5.1)
Sodium: 138 mEq/L (ref 135–145)

## 2010-11-03 LAB — HEMOGLOBIN A1C: Hgb A1c MFr Bld: 6.1 % (ref 4.6–6.5)

## 2010-11-03 LAB — HEPATIC FUNCTION PANEL
ALT: 18 U/L (ref 0–35)
AST: 18 U/L (ref 0–37)
Albumin: 4 g/dL (ref 3.5–5.2)
Alkaline Phosphatase: 81 U/L (ref 39–117)
Bilirubin, Direct: 0.1 mg/dL (ref 0.0–0.3)
Total Bilirubin: 0.6 mg/dL (ref 0.3–1.2)
Total Protein: 7.2 g/dL (ref 6.0–8.3)

## 2010-11-03 LAB — LIPID PANEL
Cholesterol: 131 mg/dL (ref 0–200)
HDL: 51.7 mg/dL (ref >39.00)
LDL Cholesterol: 66 mg/dL (ref 0–99)
Total CHOL/HDL Ratio: 3
Triglycerides: 65 mg/dL (ref 0.0–149.0)
VLDL: 13 mg/dL (ref 0.0–40.0)

## 2010-11-03 LAB — MICROALBUMIN / CREATININE URINE RATIO
Creatinine,U: 214.1 mg/dL
Microalb Creat Ratio: 0.1 mg/g (ref 0.0–30.0)
Microalb, Ur: 0.2 mg/dL (ref 0.0–1.9)

## 2010-11-03 MED ORDER — INSULIN PEN NEEDLE 31G X 5 MM MISC: 1.2000 [IU] | Status: DC

## 2010-11-03 MED ORDER — LIRAGLUTIDE 18 MG/3ML ~~LOC~~ SOLN: 1.2000 mg | Freq: Every day | SUBCUTANEOUS | Status: DC

## 2010-11-03 MED ORDER — OLMESARTAN 10 MG HALF TABLET: 10.0000 mg | ORAL_TABLET | Freq: Every day | ORAL | Status: DC

## 2010-11-03 MED ORDER — ESTRADIOL 0.1 MG/24HR TD PTTW: 1.0000 | MEDICATED_PATCH | TRANSDERMAL | Status: DC

## 2010-11-03 MED ORDER — FUROSEMIDE 20 MG PO TABS: ORAL_TABLET | ORAL | Status: DC

## 2010-11-03 NOTE — Telephone Encounter (Signed)
Meredith from [facility] called to verify rx for Benicar. Per OV she was on 20 mg- it states to lower to 10 mg. The rx for Benicar states take  0.5 mg (total 10 mg daily) on a 10 mg tab. Verified w/ Selena Batten will send in for Benicar 10 mg 1 tab by mouth daily.

## 2010-11-03 NOTE — Progress Notes (Signed)
Addended by: [name] on: 11/03/2010 09:40 AM   Modules accepted: Orders

## 2010-11-03 NOTE — Assessment & Plan Note (Signed)
Well controlled Check labs Lower benicar to 10 mg

## 2010-11-03 NOTE — Progress Notes (Signed)
  Subjective:    Patient ID: [name], female    DOB: October 02, 1955, 55 y.o.   MRN: [id number]  HPI HYPERTENSION Disease Monitoring Blood pressure range- 109/64 Chest pain- no      Dyspnea- no Medications Compliance- good Lightheadedness- no   Edema- no   DIABETES Disease Monitoring Blood Sugar ranges-70-112 Polyuria- no New Visual problems- no Medications Compliance- good Hypoglycemic symptoms- no   HYPERLIPIDEMIA Disease Monitoring See symptoms for Hypertension Medications Compliance- good RUQ pain- no  Muscle aches- no  ROS See HPI above   PMH Smoking Status noted    Review of Systems  All other systems reviewed and are negative.       Objective:   Physical Exam  Constitutional: She is oriented to person, place, and time. She appears well-developed and well-nourished.  Cardiovascular: Normal rate, regular rhythm and normal heart sounds.   No murmur heard. Pulmonary/Chest: Effort normal and breath sounds normal. No respiratory distress. She has no wheezes. She has no rales.  Musculoskeletal: Normal range of motion. She exhibits no edema and no tenderness.  Neurological: She is alert and oriented to person, place, and time.  Skin: Skin is warm and dry. No rash noted. No erythema. No pallor.  Psychiatric: She has a normal mood and affect. Her behavior is normal. Judgment and thought content normal.  Sensory exam of the foot is normal, tested with the monofilament. Good pulses, no lesions or ulcers, good peripheral pulses.        Assessment & Plan:

## 2010-11-03 NOTE — Progress Notes (Signed)
Addended by: Almeta Monas on: 11/03/2010 04:26 PM   Modules accepted: Orders

## 2010-11-03 NOTE — Patient Instructions (Signed)
Diabetes, Type 2 Diabetes is a lasting (chronic) disease. In type 2 diabetes, the pancreas does not make enough insulin (a hormone), and the body does not respond normally to the insulin that is made. This type of diabetes was also previously called adult onset diabetes. About 90% of all those who have diabetes have type 2. It usually occurs after the age of 40 but can occur at any age. CAUSES Unlike type 1 diabetes, which happens because insulin is no longer being made, type 2 diabetes happens because the body is making less insulin and has trouble using the insulin properly. SYMPTOMS  Drinking more than usual.   Urinating more than usual.   Blurred vision.   Dry, itchy skin.   Frequent infection like yeast infections in women.   More tired than usual (fatigue).  TREATMENT  Healthy eating.   Exercise.   Medication, if needed.   Monitoring blood glucose (sugar).   Seeing your caregiver regularly.  HOME CARE INSTRUCTIONS  Check your blood glucose (sugar) at least once daily. More frequent monitoring may be necessary, depending on your medications and on how well your diabetes is controlled. Your caregiver will advise you.   Take your medicine as directed by your caregiver.   Do not smoke.   Make wise food choices. Ask your caregiver for information. Weight loss can improve your diabetes.   Learn about low blood glucose (hypoglycemia) and how to treat it.   Get your eyes checked regularly.   Have a yearly physical exam. Have your blood pressure checked. Get your blood and urine tested.   Wear a pendant or bracelet saying that you have diabetes.   Check your feet every night for sores. Let your caregiver know if you have sores that are not healing.  SEEK MEDICAL CARE IF:  You are having problems keeping your blood glucose at target range.   You feel you might be having problems with your medicines.   You have symptoms of an illness that is not improving after 24  hours.   You have a sore or wound that is not healing.   You notice a change in vision or a new problem with your vision.   You develop a fever of more than 100.4.  Document Released: 06/28/2005 Document Re-Released: 07/20/2009 [employer] Patient Information 2011 [employer]. 

## 2010-11-03 NOTE — Assessment & Plan Note (Signed)
con't meds Check labs Well controlled!! Eye exam done 12/25/2009

## 2010-11-03 NOTE — Assessment & Plan Note (Signed)
con't meds , check labs

## 2010-11-04 ENCOUNTER — Encounter: Payer: Self-pay | Admitting: *Deleted

## 2010-11-24 NOTE — Op Note (Signed)
[name], [name]              ACCOUNT NO.:  [id number]   MEDICAL RECORD NO.:  [id number]          PATIENT TYPE:  OIB   LOCATION:  3534                         FACILITY:  [facility]   PHYSICIAN:  Danae Orleans. Venetia Maxon, M.D.  DATE OF BIRTH:  16-Jul-1955   DATE OF PROCEDURE:  03/19/2008  DATE OF DISCHARGE:                               OPERATIVE REPORT   PREOPERATIVE DIAGNOSIS:  Herniated cervical disk with spondylosis,  degenerative disc disease, and cervical radiculopathy C3-C4, C4-C5  levels.   POSTOPERATIVE DIAGNOSIS:  Herniated cervical disk with spondylosis,  degenerative disc disease, and cervical radiculopathy C3-C4, C4-C5  levels.   PROCEDURE:  Anterior cervical decompression and fusion C3-C4 and C4-C5  with interbody cage, morselized bone autograft, and anterior cervical  plate.   SURGEON:  Danae Orleans. Venetia Maxon, MD   ASSISTANT:  1. Georgiann Cocker, RN  2. Hilda Lias, MD   ANESTHESIA:  General endotracheal anesthesia.   ESTIMATED BLOOD LOSS:  Minimal.   COMPLICATIONS:  None.   DISPOSITION:  Recovery.   INDICATIONS:  [name] is a 55 year old woman who had previously  undergone anterior cervical decompression and fusion at C5 through C7  levels.  She had developed cervical disk herniations at C4-C5 and C3-C4  levels with severe right-sided foraminal stenosis at the C3-C4 and a  large disk herniation causing cord compression at C4-C5, it was elected  to take her to surgery for anterior decompression and fusion at these  affected levels.  Because of the preexisting plate, it was elected to  place LDR implants at each of these operated levels, so it does not  necessitate removing the previously placed plate.   PROCEDURE:  [name] was brought to the operating room.  Following  satisfactory and uncomplicated induction of general endotracheal  anesthesia and placement of intravenous lines, the patient was placed in  supine position on the operating table.  Her neck was  maintained in  neutral alignment.  She was placed in 5 pounds of halter traction and  the anterior neck was then prepped and draped in usual sterile fashion.  Area of planned incision was infiltrated with local lidocaine.  Incision  was made at the left side of midline and transverse from that crease  from midline to the anterior border of sternocleidomastoid muscle  carried sharply through platysma layer and subcutaneous fat was  dissected away to expose the sternocleidomastoid muscle.  Using blunt  dissection, the carotid sheath was kept lateral, and trachea and  esophagus kept medial.  The previously placed plate was under dense scar  tissue.  This was carefully dissected free.  The upper aspect of the  plate abutted the C4-C5 disk space, it was therefore elected to remove  the previously placed upper screws, and I did so on the left without  difficulty including the lock washer on the right, I was able to the  screw, but not the lock washer and I left that in place.  Prior to doing  so, an intraoperative x-ray confirmed correct orientation with marker  needles at the C3-C4, C4-C5 levels.  Longus colli muscles were then  taken down from the anterior cervical spine from C3 through C5 using  electrocautery and Key elevator and large amount of new bone, which had  formed overlying the superior aspect of the plate, was then removed and  this was cleared of investing soft tissue used for later use with bone  grafting.  The interspaces were incised, disk material was removed in  piecemeal fashion.  The microscope was then brought on to the field and  spinal cord dura and both C4 nerve roots were decompressed at C3-C4  level and at C4-5, both C5 nerve roots and the spinal cord dura were  decompressed.  After trial sizing, initially a 5 x 14 x 14-mm LDR  implant was placed at the C4-C5 level, however, was not able to engage  the fins appropriately.  I subsequently changed that implant out for a  6  x 12 x 14 implant, packed with morcellized bone autograft and [employer]  reconstituted with the patient's own blood.  The fins engaged well at  this level.  At C3-C4, I placed a 4.5 x 14 x 14-mm implant and engaged  the fins without difficulty.  Wound was then irrigated, soft tissues  were inspected and found to be in good repair.  This was done under  fluoroscopic visualization.  The platysma layer was then closed with 3-0  Vicryl sutures and skin edges were reapproximated with 3-0 Vicryl  subcuticular stitch.  The wound was dressed with Dermabond and the  patient was extubated in the operating room and taken to recovery room  in stable and satisfactory condition having tolerated the operation  well.  Counts are correct at the end of the case.      Danae Orleans. Venetia Maxon, M.D.  Electronically Signed     JDS/MEDQ  D:  03/19/2008  T:  03/20/2008  Job:  [id number]

## 2010-11-24 NOTE — Assessment & Plan Note (Signed)
[facility]                         GASTROENTEROLOGY OFFICE NOTE   NAME:[name], [name]                     MRN:          [id number]  DATE:11/29/2006                            DOB:          07/15/1955    REASON FOR CONSULTATION:  Reflux, abdominal pain, change in bowel  habits, and screening colonoscopy.   HISTORY:  This is a 55 year old white female with a history of  gastroesophageal reflux disease, irritable bowel syndrome, prior  cholecystectomy, diverticulosis, asthmatic bronchitis, type 2 diabetes  mellitus, anxiety with depression, and obesity, who is referred through  the courtesy of Dr. Laury Axon regarding abdominal complaints, reflux, and  the need for endoscopic procedures.  The patient has chronic reflux  disease for which she takes Prevacid.  More recently, she has been  having significant breakthrough requiring twice-daily drugs.  No  dysphagia.  Also a gnawing epigastric discomfort.  She also has chronic  right upper quadrant discomfort.  She is status post cholecystectomy.  This past February, she had an acute gastroenteritis as manifested by  abdominal pain and diarrhea.  Abdominal ultrasound revealed fatty liver.  CT scan of the abdomen and pelvis revealed mesenteric adenitis as well  as fatty liver.  She is status post hysterectomy as well.  Patient has  recovered from that; however, she has had worsening post prandial  urgency with cramping and loose stools.  No bleeding.  She has had  fluctuating weight.  She last underwent colonoscopy in June, 2003 and  was found to have diverticulosis.  There is a family history of colon  polyps with advanced histology in her mother.  Her last endoscopy at  that time was normal.   PAST MEDICAL HISTORY:  As above.   ALLERGIES:  SEPTRA, BACTRIM, MACRODANTIN, MORPHINE, FENTANYL, and  PHENERGAN.   CURRENT MEDICATIONS:  1. Benicar 40 mg daily.  2. Claritin 10 mg daily.  3. Potassium chloride  20 mEq daily.  4. Lasix 40 mg daily.  5. Prevacid 30 mg daily.  6. Multivitamin.  7. Calcium with D.  8. Citalopram 20 mg daily.  9. Fortamet 2000 mg at night.  10.Coenzyme Q10.  11.Vivelle dot patch twice weekly.  12.Librax p.r.n.  13.Robaxin p.r.n.  14.Vicodin p.r.n.   PHYSICAL EXAMINATION:  GENERAL:  A well-appearing female in no acute  distress.  She is alert and oriented.  VITAL SIGNS:  Blood pressure is 150/88.  Heart rate is 88 and regular.  Weight is 252.6 pounds.  HEENT:  Sclerae are anicteric.  Conjunctivae are pink.  Oral mucosa  intact.  NECK:  No adenopathy.  LUNGS:  Clear.  HEART:  Regular.  ABDOMEN:  Soft without tenderness, mass, or hernia.  Good bowel sounds  heard.  Prior surgical incisions are well healed.   IMPRESSION:  1. Gastroesophageal reflux disease.  Recent breakthrough symptoms, as      described above.  2. Epigastric discomfort of uncertain cause.  Most likely functional      dyspepsia.  3. Irritable bowel syndrome.  Recent post-infectious exacerbation.  4. Colon cancer screening, baseline risk.   RECOMMENDATIONS:  1. Colonoscopy.  To  provide colorectal neoplasia screening as well as      evaluate worsening problems with pain and loose stools.  The nature      of the procedure as well as the risks, benefits and alternatives      have been reviewed.  She understood and agreed to proceed.  2. Upper endoscopy. This to evaluate persistent epigastric pain and      worsening reflux.  The nature of the procedure as well as the      risks, benefits and alternatives have been reviewed.  She      understood and agreed to proceed.  3. Refill Librax, as requested.  4. Continue Prevacid.  5. Reflux precautions with attention to weight loss.  6. Ongoing general medical care with Dr. Laury Axon.     Wilhemina Bonito. Marina Goodell, MD  Electronically Signed    JNP/MedQ  DD: 11/29/2006  DT: 11/29/2006  Job #: [id number]   cc:   Lelon Perla, DO

## 2010-11-26 ENCOUNTER — Encounter: Payer: 59 | Attending: Surgery | Admitting: *Deleted

## 2010-11-26 DIAGNOSIS — Z713 Dietary counseling and surveillance: Secondary | ICD-10-CM | POA: Insufficient documentation

## 2010-11-26 DIAGNOSIS — Z09 Encounter for follow-up examination after completed treatment for conditions other than malignant neoplasm: Secondary | ICD-10-CM | POA: Insufficient documentation

## 2010-11-26 DIAGNOSIS — Z9884 Bariatric surgery status: Secondary | ICD-10-CM | POA: Insufficient documentation

## 2010-11-27 NOTE — H&P (Signed)
[facility]  Patient:    [name], [name]                       MRN: [id number] Adm. Date:  12/21/99 Attending:  Janine Limbo, M.D.                         History and Physical  HISTORY OF PRESENT ILLNESS:   [name] is a 55 year old female, para 1-1-1-2, who presents for vaginal hysterectomy and bilateral salpingo-oophorectomy because of irregular bleeding.  The patient has been treated with hormone therapy without improvement.  The patient has had a D&C performed in the past as well.  The patient has fibroids on her uterus.  The patient has had two cesarean sections in the past.  She had a D&C associated with a miscarriage in 1989.  She denies a history of sexually transmitted infections.  Her last Pap smear was in October of 2000 and it was within normal limits.  She was found to have benign polyps on her D&C.  We did try to repeat an endometrial biopsy but because of a stenotic cervix, we were unable to penetrate the endocervical canal.  The decision was made not to perform another D&C prior to hysterectomy; the patient agreed.  PAST MEDICAL HISTORY:         The patient had hypertension associated with her pregnancy.  DRUG ALLERGIES:               SEPTRA, MACRODANTIN, BACTRIM and PHENERGAN.  OBSTETRICAL HISTORY:          The patient has had two cesarean sections and one miscarriage.  SOCIAL HISTORY:               The patient is married and she works as a Engineer, civil (consulting). She does not drink alcohol, smoke cigarettes or use recreational drugs.  REVIEW OF SYSTEMS:            See HPI.  FAMILY HISTORY:               The patients mother has asthma.  Her father has heart disease.  The patients mother has hypothyroidism.  PHYSICAL EXAMINATION:  GENERAL:                      Weight is 223 pounds.  HEENT:                        Within normal limits.  CHEST:                        Clear.  HEART:                        Regular rate and  rhythm.  BREASTS:                      Without masses.  ABDOMEN:                      Nontender.  EXTREMITIES:                  Within normal limits.  NEUROLOGIC:                   Normal.  PELVIC:  External genitalia is normal.  The vagina is normal.  The cervix is nontender.  The uterus is normal size, shape and consistency.  Adnexa:  No masses.  Rectovaginal exam confirms.  ASSESSMENT:                   1. Irregular bleeding.                               2. Fibroid uterus.                               3. History of benign polyps inside the uterus.  PLAN:                         The options for care were reviewed with the patient.  She has elected to proceed with vaginal hysterectomy and oophorectomy at this time.  Her irregular bleeding has begun to interfere with her work and her home life.  The patient understands the indications for her procedure and she accepts the risks, but not limited to, anesthetic complications, bleeding, infections, and possible damage to the surrounding organs. DD:  12/18/99 TD:  12/18/99 Job: [id number]

## 2010-11-27 NOTE — Op Note (Signed)
NAMEKRISTIANN, [name]              ACCOUNT NO.:  [id number]   MEDICAL RECORD NO.:  [id number]          PATIENT TYPE:  INP   LOCATION:  5012                         FACILITY:  [facility]   PHYSICIAN:  Sharolyn Douglas, M.D.        DATE OF BIRTH:  Nov 18, 1955   DATE OF PROCEDURE:  07/01/2004  DATE OF DISCHARGE:  07/02/2004                                 OPERATIVE REPORT   PREOPERATIVE DIAGNOSIS:  Cervical spondylotic radiculopathy, C5-6 and C6-7.   POSTOPERATIVE DIAGNOSIS:  Cervical spondylotic radiculopathy, C5-6 and C6-7.   OPERATION PERFORMED:  1.  Anterior cervical diskectomy, C5-6, C6-7.  2.  Anterior cervical arthrodesis, C5-6, C6-7 placement of two allograft      prosthesis spacers packed with local autogenous bone graft.  3.  Anterior cervical plating, C5 through C7, using the Spinal Concepts      system.   SURGEON:  Sharolyn Douglas, M.D.   ASSISTANT:  Verlin Fester, P.A.   ANESTHESIA:  General endotracheal.   COMPLICATIONS:  None.   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  She also has  significant spondylosis at C3-4 with right foraminal narrowing.  Her  symptoms have been more consistent with a C6 and C7 radiculopathy and we  have elected to treat the lower two segments.  She understands that she may  require additional surgery at C3-4 if she develops persistent or worsening  symptoms in that distribution.  The risks, benefits and alternatives were  reviewed and the patient elected to proceed.   DESCRIPTION OF PROCEDURE:  The patient was properly identified in the  holding area and taken to the operating room.  She underwent general  endotracheal anesthesia without difficulty.  She was given prophylactic  intravenous antibiotics.  She was carefully positioned on the operating  table with the [place]field head rest, neck  in slight extension.  Five pounds of  halter traction was applied. The neck was prepped and draped in the usual  sterile fashion.  A transverse incision was made left side just below the  cricoid cartilage.  Dissection was carried sharply through the platysma.  The interval between sternocleidomastoid and strap muscles was developed  down to the prevertebral space.  The C5-6 and C6-7 disk spaces were easily  identifiable by the large anterior osteophytes.  Spinal needle placed.  X-  ray taken to confirm location.  Longus colli muscle elevated out over the C5-  6 and C6-7 disk spaces bilaterally.  The esophagus, trachea and carotid  sheath were identified and protected at all times.  Large anterior  osteophytes were removed with Leksell rongeur.  Caspar distraction pins were  placed in the C5, C6 and C7 vertebral bodies.  Starting at C6-7 diskectomy  carried back to the posterior longitudinal ligament.  The cartilaginous end  plates were scraped.  High speed bur used to take down the uncovertebral  joints and overhanging osteophytes.  A 2 mm Kerrison punch used to complete  the foraminotomies bilaterally.  The posterior longitudinal ligament was  taken down and the spinal canal decompressed.  Similar procedure carried out  at C5-6.  We then placed a 9 mm allograft prosthesis spacer packed with  local autogenous bone graft from the drill shavings.  This was carefully  countersunk 1 mm into the C6-7 disk space.  A 7 mm interbody prosthesis  utilized at C5-6.  The Caspar distraction pins were removed.  The weight was  removed from the halter traction.  We then placed a 44 mm Spinal Concepts  anterior cervical plate with six 12 mm screws.  We inserted the locking  mechanism engaged.  The screw purchase was marginal and the bone was soft.  The wound was irrigated, the esophagus, trachea and carotid sheath were  inspected.  There were no injuries.  Intraoperative x-ray showed appropriate   positioning at C5-6 and C6-7.  Deep Penrose drain left in place.  The  platysma closed with interrupted 2-0 Vicryl, subcutaneous layer closed with  interrupted 3-0 Vicryl followed by a running 4-0 subcuticular Vicryl suture  on the skin edges.  Benzoin and Steri-Strips placed.  Sterile dressing  applied.  Soft collar placed.  The patient was extubated without difficulty  and transferred to recovery in stable condition.      MC/MEDQ  D:  07/01/2004  T:  07/02/2004  Job:  [id number]

## 2010-11-27 NOTE — H&P (Signed)
[name], [name]              ACCOUNT NO.:  [id number]   MEDICAL RECORD NO.:  [id number]          PATIENT TYPE:  INP   LOCATION:  NA                           FACILITY:  [facility]   PHYSICIAN:  [name], M.D.        DATE OF BIRTH:  [birth year]   DATE OF ADMISSION:  07/01/2004  DATE OF DISCHARGE:                                HISTORY & PHYSICAL   CHIEF COMPLAINT:  Pain in my neck and right arm.   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 She has been treated with  traction, selective nerve root injections with really no improvement. She  now has to take analgesics and anti-inflammatories for discomfort.  She is  using her cervical collar no an as-needed basis.  MRI has shown C5-6  spondylosis as well as protruding disk materials. This has been seen more so  on the right than the left. C6-7 herniated nucleus pulposus is also seen  lateralizing to the right and compromising the right lateral recess.  Unfortunately the patient has had no weakness in the right upper extremity,  but she has consistent and constant pain into her arms and right cervical  spine. After much consideration and failure of conservative measures, after  the pros and cons, risks and benefits of the surgery were presented to her,  she decided to give her approval to ahead with anterior cervical diskectomy  and fusion at C5-6 and C6-7 with allograft and plate. She has been cleared  preoperatively by Dr. Lutricia Skinner of [facility].   PAST MEDICAL HISTORY:  Allergies consist of SEPTRA which causes a rash;  BACTRIM causes a rash; MACRODANTIN causes whelps; PERCOCET causes itching;  PHENERGAN IV causes hallucinations; MORPHINE causes constant nausea and  vomiting; HCTZ causes rash; INH causes severe anaphylactoid type reaction  with shortness of breath, red  skin, chills, increased temperature and  increased liver enzymes. She can take Dilaudid for pain and discomfort.   CURRENT MEDICATIONS:  1.  Mevacor 40 mg one daily.  2.  Lasix 20 mg two daily.  3.  Potassium 20 mEq one daily.  4.  Prevacid 30 mg one daily.  5.  Lexapro 10 mg one daily.  6.  Robaxin p.r.n. muscle spasms.  7.  Multivitamin one daily.  8.  Vicodin for pain.   She has a history of migraine-type headaches, hypertension, reflux. 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 She has  gone through surgically-induced menopause and has fibrocystic disease of the  breast. Past surgeries include tonsillectomy in June 1971, right knee  arthroscopy and arthrotomy in May 1981, cholecystectomy in November 1986, in  April 1989 [facility] for incomplete abortion, in December 1989 Cesarean section,  in August 1993 Cesarean section with tubal ligation, in August 1995 sinus  surgery with reduction of turbinates, in January 1997 sinus surgery again,  in September 1999 hysterectomy and removal of uterine polyp,  in June 2001  total abdominal hysterectomy and oophorectomy, in April 2005 repair of  Haggard deformity by Dr. Fonnie Skinner from the right heel.   FAMILY HISTORY:  Positive for heart disease in her father. Hypertension also  in her father and mother. Diabetes in her father and mother. Breast cancer  in her mother. TIAs in her father. Arthritis in her mother.   SOCIAL HISTORY:  She is married, a Designer, jewellery. Has no intake of  alcohol or tobacco products. Has two children, female, ages 53 and 95.  Caregiver after surgery will her husband, [name], and two children. She  lives in a two-story house with 15 stairs.   REVIEW OF SYSTEMS:  CNS: No seizure disorder, paralysis, or double vision.  The patient does have radiculitis consistent with cervical pathology.  RESPIRATORY:  No shortness of breath, productive cough, or hemoptysis.  CARDIOVASCULAR: No chest pain,  angina, or orthopnea. GASTROINTESTINAL: No  nausea, vomiting, melena, or bloody stool. GENITOURINARY: No discharge,  dysuria, or hematuria. MUSCULOSKELETAL: Primarily in present illness.   PHYSICAL EXAMINATION:  VITAL SIGNS: Pulse 76 and regular, respirations 12  and labored, blood pressure 150/82 left arm seated.  GENERAL: Alert, cooperative, friendly, somewhat anxious, well-groomed 24-  year-old white female.  HEENT:  Normocephalic. PERRLA. Oropharynx is clear. EOMs are intact.  CHEST: Clear to auscultation. No rales or rhonchi.  HEART: Regular rate and rhythm.  No murmurs are heard.  ABDOMEN: Soft, nontender.  Liver and spleen not felt.  RECTAL/PELVIC/BREASTS/GENITALIA: Not done; not pertinent to the present  illness.  EXTREMITIES: No gross weakness in the upper extremities. Decreased sensation  in the C6-7 distribution in the right upper extremity, positive Hoffman sign  on the left.  She also has a mild impingement sign in the right shoulder.   ADMISSION DIAGNOSES:  1.  Herniated nucleus pulposus, C5-6 and C6-7 to the right.  2.  Hypertension.  3.  Reflux.  4.  Migraines.   PLAN:  The patient will undergo anterior cervical diskectomy and fusion, C5-  6 and C6-7 with allograft and plate. This patient has been cleared  preoperatively by Dr. Lutricia Skinner of [facility]. She has her own  cervical collar which she will use postoperatively.       DLU/MEDQ  D:  06/30/2004  T:  06/30/2004  Job:  [id number]   cc:   [name], M.D. [facility]

## 2010-11-27 NOTE — Op Note (Signed)
NAMESHANTERIA, LAYE                        ACCOUNT NO.:  [id number]   MEDICAL RECORD NO.:  [id number]                   PATIENT TYPE:  AMB   LOCATION:  DSC                                  FACILITY:  [facility]   PHYSICIAN:  Leonides Grills, M.D.                  DATE OF BIRTH:  04/04/1956   DATE OF PROCEDURE:  10/22/2003  DATE OF DISCHARGE:                                 OPERATIVE REPORT   PREOPERATIVE DIAGNOSES:  1. Right Haglund's deformity.  2. Right calcific Achilles tendinitis.  3. Right tight gastroc.   POSTOPERATIVE DIAGNOSES:  1. Right Haglund's deformity.  2. Right calcific Achilles tendinitis.  3. Right tight gastroc.   OPERATION PERFORMED:  1. Right excision of Haglund's deformity.  2. Right excision of calcification within the Achilles tendon.  3. Right primary repair of Achilles tendon.  4. Right gastroc slide.  5. Stress x-rays right ankle.   SURGEON:  Leonides Grills, M.D.   ASSISTANT:  Lianne Cure, P.A.   ANESTHESIA:  General endotracheal tube with popliteal block.   ESTIMATED BLOOD LOSS:  Minimal.   TOURNIQUET TIME:  Approximately an hour.   COMPLICATIONS:  None.   DISPOSITION:  Stable to PR.   INDICATIONS FOR PROCEDURE:  The patient is a 55 year old female with  longstanding right posterior heel pain that was interfering with her life to  the point she can not do what she wants to do.  She was consented for the  above procedure.  All risks, which include infection, neurovascular injury,  persistent pain, worsening pain, Achilles tendon rupture, stiffness, were  all explained, questions were encouraged and answered.   DESCRIPTION OF PROCEDURE:  The patient was brought to the operating room and  placed in supine position initially after adequate general endotracheal tube  anesthesia was administered with popliteal block as well as Ancef 1 g IV  piggyback.  The patient was then placed in a prone position after a proximal  thigh tourniquet.  After the  patient was placed in prone position, the right  lower extremity was then prepped and draped in sterile manner.  The limb was  gravity exsanguinated and the tourniquet was elevated to 290 mmHg.  A  longitudinal incision over the gastrocnemius musculotendinous junction was  then made.  Dissection was carried down through skin.  Hemostasis was  obtained.  The fascia was opened in line with the incision.  The interval  between the gastroc-soleus was then developed bluntly and the soft tissue  was then elevated off the posterior aspect of the gastrocnemius.  Sural  nerve was identified and protected posteriorly.  The gastrocnemius tendon  was then cut with the Mayo scissors and released.  This had excellent of the  tight gastroc.  Wound was copiously irrigated with normal saline.  Subcu was  closed with 3-0 Vicryl.  The skin was closed with 4-0 Monocryl subcuticular  stitch.  Steri-Strips were  applied.  We then made a longitudinal incision on  either side of the Achilles tendon.  Dissection was carried down to bone.  There was a tremendous amount of scar and inflammatory tissue in the  retrocalcaneal bursa.  This was then debrided.  The Haglund's deformity was  then dissected out and with a sagittal saw this was then removed.  Once this  was removed in total and the edges were rounded off with a rongeur on either  side, calcification within the Achilles tendon was then removed meticulously  with a scalpel using Hohmann retractors.  Once this was done, we obtained a  stress x-ray which basically showed that there was adequate amount of  decompression from the area.  We then placed two 5.0 absorbable suture  anchors on either side of the calcaneal tuber towards the Achilles tendon  insertion.  This was with a #2 FiberWire.  We then did a primary repair of  the Achilles tendon back down to the calcaneal tuber.  This had excellent  repair of the Achilles tendon.  Prior to doing this, the wound was  copiously  irrigated with normal saline as well.  After repair, the posterior aspect of  the heel was completely decompressed and aesthetically looked excellent.  The tourniquet was deflated.  Hemostasis was obtained.  Subcutaneous was  closed with 3-0 Vicryl.  The skin was closed with 4-0 nylon stitch.  Sterile  dressing was applied.  A modified Jones dressing was applied.  The patient  was stable to the PR.                                               Leonides Grills, M.D.    PB/MEDQ  D:  10/22/2003  T:  10/22/2003  Job:  [id number]

## 2010-11-27 NOTE — Discharge Summary (Signed)
[facility]  Patient:    [name], [name]                     MRN: [id number] Adm. Date:  [id number] Disc. Date: [id number] Attending:  Leonard Schwartz Dictator:   Henreitta Leber, P.A.                           Discharge Summary  DISCHARGE DIAGNOSES:          1. Irregular bleeding.                               2. Fibroid uterus.  PROCEDURE:                    On the day of admission, the patient underwent a transvaginal hysterectomy with bilateral salpingo-oophorectomy.  HISTORY OF PRESENT ILLNESS:   The patient is a 55 year old married white female, para 1-1-1-2 who presents for vaginal hysterectomy and bilateral salpingo-oophorectomy because of irregular bleeding.  Please see the dictated history and physical examination for details.  PHYSICAL EXAMINATION:  VITAL SIGNS                   Weight 222 pounds.  GENERAL:                      Within normal limits.  PELVIC:                       External genitalia within normal limits.  The vagina is normal.  The cervix is nontender.  The uterus is normal size, shape and consistency.  Adnexa without tenderness or masses.  Rectovaginal exam confirms.  HOSPITAL COURSE:              On the day of admission, the patient underwent a transvaginal hysterectomy with bilateral salpingo-oophorectomy and cystoscopy. The patient tolerated all procedures well.  Findings included normal tubes and ovaries, fibroid uterus, and patent ureters bilaterally.  The patients postoperative course was remarkable for the patients intolerance of analgesia.  However, this problem was remedied by the afternoon of postoperative day #1 and the patient achieved adequate pain control and progressed without difficulty.  By postoperative day #2, the patient had received the maximum benefit of her hospital stay, having achieved a return of bowel and bladder function, and was ready for discharge to home.  LABORATORY DATA:               Postoperative hemoglobin was 10.8 with preoperative hemoglobin 13.3.  DISCHARGE MEDICATIONS:        1. Percocet 7.5/500, one tablet q.6h. with no                                  refills.                               2. Pyridium 200 mg, one tablet t.i.d. with food.                                  No refills.  3. Reglan 10 mg, one tablet q.i.d. p.r.n.                                  nausea, one refill.                               4. Ibuprofen 800 mg, one tablet q.6h. with food,                                  two refills.                               5. Premarin 0.625 mg, one tablet q.d.  FOLLOW UP:                    The patient is to follow up with Dr. Stefano Gaul at [facility] on February 08, 2000 at 3;30 p.m. (six weeks postoperative exam).  DISCHARGE INSTRUCTIONS:       The patient was given a copy of [facility] postoperative instruction sheet.  She was further instructed to avoid driving for two weeks, heavy lifting for four weeks and intercourse for six weeks.  The patient was urged to call the office for any questions or concerns.  FINAL PATHOLOGY:              Uterus, tubes and ovaries - Cervix with no pathologic abnormalities.  Benign proliferative endometrium.  Two benign uterine leiomyomata.  Bilateral ovaries and fallopian tubes with no pathologic abnormalities identified. DD:  12/23/99 TD:  12/25/99 Job: [id number]

## 2010-11-27 NOTE — Op Note (Signed)
[facility]  Patient:    CORNELIOUS, DIVEN                     MRN: [id number] Proc. Date: 12/21/99 Adm. Date:  [id number] Attending:  Leonard Schwartz                           Operative Report  PREOPERATIVE DIAGNOSIS:       1. Irregular uterine bleeding.                               2. Fibroid uterus.                               3. History of benign uterine polyps.  POSTOPERATIVE DIAGNOSIS:      1. Irregular uterine bleeding.                               2. Fibroid uterus.                               3. History of benign uterine polyps.  OPERATION:                    Vaginal hysterectomy with bilateral salpingo-oophorectomy, cystoscopy.  SURGEON:                      Janine Limbo, M.D.  FIRST ASSISTANT:              Henreitta Leber, P.A.  ANESTHESIA:                   Epidural/spinal and IV sedation.  DISPOSITION:                  The patient is a 55 year old female, para 1-1-1-2 who presents with the above mentioned diagnoses.  She has not responded to conservative therapies.  The patient understand the indications for her procedure and she accepts of (but not limited to) anesthetic complications, bleeding, infection and possible damage to the surrounding organs.  FINDINGS:                     The uterus was upper limits of normal size and there were small subserosal fibroids present.  The fallopian tubes and ovaries appeared normal.  With cystoscopy, the patient was noted to have patent ureters bilaterally.  There were no signs of damage to the inner lining of the bladder.  DESCRIPTION OF PROCEDURE:  The patient was taken to the operating room, where she was given a combination spinal/epidural anesthetic.  She was also given medication through her IV line.  Her abdomen, perineum and vagina were prepped with multiple layers of Betadine.  A Foley catheter was placed in the bladder.  The patient was sterilely draped.   Examination under anesthesia was performed.  The cervix was injected with a diluted solution of Pitressin and saline.  A circumferential incision was made around the cervix and the mucosa was advanced both anteriorly and posteriorly.  The anterior cul-de-sac was sharply entered.  The posterior cul-de-sac was sharply entered.  Alternating from left to right, the uterosacral ligaments, paracervical tissues, parametrial tissues, uterine  arteries and upper pedicles were clamped, cut, sutured and tied securely.  The uterus was inverted through the posterior colpotomy.  The remaining upper pedicles were clamped and cut and the uterus was removed from the operative field.  The upper pedicles were secured using free ties and suture ligatures of 0 Vicryl.  The right ovary and fallopian tube were then brought down into the operative field.  The right infundibulopelvic ligament was isolated.  The ligament was then clamped, cut, free tied, suture ligated and tied securely.  Hemostasis was noted to be adequate.  An identical procedure was carried out on the opposite side.  We noticed that there was a small amount of blood present in the posterior cul-de-sac.  We had a difficult time isolating the area of bleeding.  We were able to find an area of bleeding on the left pelvic sidewall that was secured and then suture ligated.  Hemostasis was then felt to be adequate.  The sutures attached to the uterosacral ligaments were brought out through the vaginal angles and tied securely.  A McCall culdoplasty suture was placed using 0 Vicryl, incorporating the uterosacral ligament and the peritoneum of the posterior cul-de-sac.  A final check was made for hemostasis and, again, hemostasis was adequate throughout.  All instruments were removed.  The vaginal cuff was closed using figure-of-eight sutures.  The Rogelio Seen coldoplasty was tied securely and the apex of the vagina was noted to elevate into the mid pelvis.   The suture material used was 0 Vicryl.  Estimated blood loss was 300 cc.  The patient produced 550 cc of clear urine.  The patient was then given indigo carmine and cystoscopy was performed.  The patient was noted to have blue dye passing through both ureteral orifices.  The Foley catheter was then reinserted.  The patient was returned to the supine position.  She was taken to the recovery room in stable condition. Sponge, needle and instrument counts were correct.  She tolerated her procedure well. DD:  12/21/99 TD:  12/22/99 Job: [id number]

## 2010-11-30 ENCOUNTER — Ambulatory Visit: Payer: 59 | Admitting: *Deleted

## 2010-12-08 ENCOUNTER — Ambulatory Visit: Payer: 59 | Admitting: *Deleted

## 2011-01-11 ENCOUNTER — Other Ambulatory Visit (INDEPENDENT_AMBULATORY_CARE_PROVIDER_SITE_OTHER): Payer: Self-pay | Admitting: Surgery

## 2011-01-11 ENCOUNTER — Ambulatory Visit (INDEPENDENT_AMBULATORY_CARE_PROVIDER_SITE_OTHER): Payer: Commercial Managed Care - PPO | Admitting: Surgery

## 2011-01-11 DIAGNOSIS — R109 Unspecified abdominal pain: Secondary | ICD-10-CM

## 2011-01-11 NOTE — Progress Notes (Signed)
[name] comes in today with a history of nausea and wretching.  No physical findings to support a hernia in any of her port sites.  She does have some skin changes in her perineal area from moisture after her weight loss.  Will get UGI and photo of her skin breakdown.

## 2011-01-12 ENCOUNTER — Ambulatory Visit (HOSPITAL_COMMUNITY)
Admission: RE | Admit: 2011-01-12 | Discharge: 2011-01-12 | Disposition: A | Discharge status: Home / Self Care | Payer: Commercial Managed Care - PPO | Source: Ambulatory Visit | Attending: Surgery | Admitting: Surgery

## 2011-01-12 ENCOUNTER — Other Ambulatory Visit (INDEPENDENT_AMBULATORY_CARE_PROVIDER_SITE_OTHER): Payer: Self-pay | Admitting: Surgery

## 2011-01-12 DIAGNOSIS — R1310 Dysphagia, unspecified: Secondary | ICD-10-CM | POA: Insufficient documentation

## 2011-01-12 DIAGNOSIS — R112 Nausea with vomiting, unspecified: Secondary | ICD-10-CM | POA: Insufficient documentation

## 2011-01-12 DIAGNOSIS — Z9884 Bariatric surgery status: Secondary | ICD-10-CM | POA: Insufficient documentation

## 2011-01-21 ENCOUNTER — Encounter: Payer: Commercial Managed Care - PPO | Attending: Surgery | Admitting: *Deleted

## 2011-01-21 ENCOUNTER — Encounter: Payer: Self-pay | Admitting: *Deleted

## 2011-01-21 DIAGNOSIS — Z9884 Bariatric surgery status: Secondary | ICD-10-CM

## 2011-01-21 DIAGNOSIS — Z09 Encounter for follow-up examination after completed treatment for conditions other than malignant neoplasm: Secondary | ICD-10-CM | POA: Insufficient documentation

## 2011-01-21 DIAGNOSIS — Z713 Dietary counseling and surveillance: Secondary | ICD-10-CM | POA: Insufficient documentation

## 2011-01-21 NOTE — Patient Instructions (Addendum)
   Continue Phase 3B: High Protein + Non-starchy Vegetables  Continue regular physical activity (3-4 time/week, 30 mins+)  Chew slowly, take small sips   Follow up with surgeon on band adjustment, as needed

## 2011-01-21 NOTE — Progress Notes (Signed)
  Follow-up visit: 12 Months Post-Operative LAGB Surgery  Medical Nutrition Therapy:  Appt start time: 0730 end time:  0800.  Assessment:  Primary concerns today: post-operative bariatric surgery nutrition management. Pt has met 70% of weight loss goal of 180# in one year. [name] reports good diet recall, reaching protein and fluid goals. She eats 4-6 small meals today and notes satiety and fullness after eating.  She reports frequent vomiting secondary to "food getting stuck". Pt had an upper GI study where band was reported to be in place. GI issues likely due to esophageal spasms and "eating and drinking to fast". Pt to work with MD on band adjustment for this problem.  Weight today: 204.6 lbs Weight change: 1.5 lbs down Total weight lost: 58 lbs total BMI: 28.5%  Fluid intake: 64 oz+  Estimated total protein intake: 70 g +  Medications: Crestor, Benicar, Victoza Supplementation: Taking MVI and Calcium Citrate regularly.  Lab Results  Component Value Date   HGBA1C 6.1 11/03/2010  *Pt notes latest A1c was 5.9% at pharmacy visit.  Lab Results  Component Value Date   CHOL 131 11/03/2010   CHOL 155 01/29/2010   CHOL 111 06/30/2009   Lab Results  Component Value Date   HDL 51.70 11/03/2010   HDL 16.10* 01/29/2010   HDL 39.70 06/30/2009   Lab Results  Component Value Date   LDLCALC 66 11/03/2010   LDLCALC 87 01/29/2010   LDLCALC 52 06/30/2009   Lab Results  Component Value Date   TRIG 65.0 11/03/2010   TRIG 147.0 01/29/2010   TRIG 97.0 06/30/2009   Lab Results  Component Value Date   CHOLHDL 3 11/03/2010   CHOLHDL 4 01/29/2010   CHOLHDL 3 06/30/2009   No results found for this basename: LDLDIRECT   CBG monitoring: Multiple times/week Average CBG per patient: 100-115 Last patient reported A1c: 5.9%  Using straws: None Drinking while eating: None Hair loss: None Carbonated beverages: None N/V/D/C: Occasional vomiting secondary to esophageal spasms; eating too  fast? Dumping syndrome: N/A Last Lap-Band fill: May 2012; To see surgeon this month for fill f/u  Recent physical activity:  Regular activity reported ( +)  Progress Towards Goal(s):  In progress.   Nutritional Diagnosis:  [place]-1.4 Altered GI function As related to esophageal spasms.  As evidenced by pt with frequent vomiting and "food getting stuck"..    Intervention:    Continue Phase 3B: High Protein + Non-starchy Vegetables  Continue regular physical activity (3-4 time/week, 30 mins+)  Chew slowly, take small sips   Follow up with surgeon on band adjustment, as needed  Monitoring/Evaluation:  Dietary intake, exercise, lap band fills, and body weight. Follow up in 3 months for 15 month post-op visit.

## 2011-01-26 ENCOUNTER — Other Ambulatory Visit: Payer: Self-pay | Admitting: Family Medicine

## 2011-02-02 ENCOUNTER — Ambulatory Visit: Payer: 59 | Admitting: Family Medicine

## 2011-02-04 ENCOUNTER — Encounter: Payer: Self-pay | Admitting: Family Medicine

## 2011-02-04 ENCOUNTER — Ambulatory Visit (INDEPENDENT_AMBULATORY_CARE_PROVIDER_SITE_OTHER): Payer: Commercial Managed Care - PPO | Admitting: Family Medicine

## 2011-02-04 DIAGNOSIS — I10 Essential (primary) hypertension: Secondary | ICD-10-CM

## 2011-02-04 DIAGNOSIS — E119 Type 2 diabetes mellitus without complications: Secondary | ICD-10-CM

## 2011-02-04 DIAGNOSIS — E785 Hyperlipidemia, unspecified: Secondary | ICD-10-CM

## 2011-02-04 DIAGNOSIS — Z9884 Bariatric surgery status: Secondary | ICD-10-CM

## 2011-02-04 LAB — POCT URINALYSIS DIPSTICK
Bilirubin, UA: NEGATIVE
Blood, UA: NEGATIVE
Glucose, UA: NEGATIVE
Leukocytes, UA: NEGATIVE
Nitrite, UA: NEGATIVE
Protein, UA: NEGATIVE
Spec Grav, UA: 1
Urobilinogen, UA: 0.2
pH, UA: 7

## 2011-02-04 LAB — HEPATIC FUNCTION PANEL
ALT: 23 U/L (ref 0–35)
AST: 21 U/L (ref 0–37)
Albumin: 4.6 g/dL (ref 3.5–5.2)
Alkaline Phosphatase: 80 U/L (ref 39–117)
Bilirubin, Direct: 0.1 mg/dL (ref 0.0–0.3)
Total Bilirubin: 0.7 mg/dL (ref 0.3–1.2)
Total Protein: 8.4 g/dL — ABNORMAL HIGH (ref 6.0–8.3)

## 2011-02-04 LAB — CBC WITH DIFFERENTIAL/PLATELET
Basophils Absolute: 0.1 10*3/uL (ref 0.0–0.1)
Basophils Relative: 0.8 % (ref 0.0–3.0)
Eosinophils Absolute: 0.2 10*3/uL (ref 0.0–0.7)
Eosinophils Relative: 2.2 % (ref 0.0–5.0)
HCT: 43.5 % (ref 36.0–46.0)
Hemoglobin: 14.1 g/dL (ref 12.0–15.0)
Lymphocytes Relative: 31.1 % (ref 12.0–46.0)
Lymphs Abs: 2.7 10*3/uL (ref 0.7–4.0)
MCHC: 32.3 g/dL (ref 30.0–36.0)
MCV: 87.1 fl (ref 78.0–100.0)
Monocytes Absolute: 0.7 10*3/uL (ref 0.1–1.0)
Monocytes Relative: 8.1 % (ref 3.0–12.0)
Neutro Abs: 5 10*3/uL (ref 1.4–7.7)
Neutrophils Relative %: 57.8 % (ref 43.0–77.0)
Platelets: 261 10*3/uL (ref 150.0–400.0)
RBC: 4.99 Mil/uL (ref 3.87–5.11)
RDW: 14.2 % (ref 11.5–14.6)
WBC: 8.6 10*3/uL (ref 4.5–10.5)

## 2011-02-04 LAB — MICROALBUMIN / CREATININE URINE RATIO
Creatinine,U: 211.5 mg/dL
Microalb Creat Ratio: 0.1 mg/g (ref 0.0–30.0)
Microalb, Ur: 0.2 mg/dL (ref 0.0–1.9)

## 2011-02-04 LAB — BASIC METABOLIC PANEL
BUN: 11 mg/dL (ref 6–23)
CO2: 30 mEq/L (ref 19–32)
Calcium: 9.2 mg/dL (ref 8.4–10.5)
Chloride: 99 mEq/L (ref 96–112)
Creatinine, Ser: 0.8 mg/dL (ref 0.4–1.2)
GFR: 83 mL/min (ref >60.00)
Glucose, Bld: 81 mg/dL (ref 70–99)
Potassium: 3.6 mEq/L (ref 3.5–5.1)
Sodium: 137 mEq/L (ref 135–145)

## 2011-02-04 LAB — TSH: TSH: 2.49 u[IU]/mL (ref 0.35–5.50)

## 2011-02-04 LAB — VITAMIN B12: Vitamin B-12: 276 pg/mL (ref 211–911)

## 2011-02-04 LAB — LIPID PANEL
Cholesterol: 137 mg/dL (ref 0–200)
HDL: 65.2 mg/dL (ref >39.00)
LDL Cholesterol: 62 mg/dL (ref 0–99)
Total CHOL/HDL Ratio: 2
Triglycerides: 50 mg/dL (ref 0.0–149.0)
VLDL: 10 mg/dL (ref 0.0–40.0)

## 2011-02-04 LAB — HEMOGLOBIN A1C: Hgb A1c MFr Bld: 6.2 % (ref 4.6–6.5)

## 2011-02-04 MED ORDER — INSULIN PEN NEEDLE 31G X 5 MM MISC: 1.2000 [IU] | Status: DC

## 2011-02-04 NOTE — Patient Instructions (Signed)

## 2011-02-04 NOTE — Progress Notes (Signed)
  Subjective:    Patient here for follow-up of elevated blood pressure.  She is exercising and is adherent to a low-salt diet.  Blood pressure is well controlled at home. Cardiac symptoms: none. Patient denies: chest pain, chest pressure/discomfort, dyspnea, exertional chest pressure/discomfort, fatigue, irregular heart beat and palpitations. Cardiovascular risk factors: diabetes mellitus and hypertension. Use of agents associated with hypertension: none. History of target organ damage: none.  The following portions of the patient's history were reviewed and updated as appropriate: allergies, current medications, past family history, past medical history, past social history, past surgical history and problem list.  Review of Systems Pertinent items are noted in HPI.     Objective:    BP 136/82  Pulse 68  Temp(Src) 98.7 F (37.1 C) (Oral)  Wt 206 lb 9.6 oz (93.713 kg)  SpO2 97% General appearance: alert, cooperative, appears stated age and no distress Lungs: clear to auscultation bilaterally Heart: S1, S2 normal Extremities: extremities normal, atraumatic, no cyanosis or edema    Assessment:    Hypertension, normal blood pressure . Evidence of target organ damage: none.   DMII Hyperlipidemia Plan:    Medication: no change. Dietary sodium restriction. Regular aerobic exercise. Follow up: 6 months and as needed.

## 2011-02-05 ENCOUNTER — Ambulatory Visit (INDEPENDENT_AMBULATORY_CARE_PROVIDER_SITE_OTHER): Payer: Commercial Managed Care - PPO | Admitting: Surgery

## 2011-02-05 ENCOUNTER — Encounter (INDEPENDENT_AMBULATORY_CARE_PROVIDER_SITE_OTHER): Payer: 59 | Admitting: Surgery

## 2011-02-05 DIAGNOSIS — Z9884 Bariatric surgery status: Secondary | ICD-10-CM

## 2011-02-05 DIAGNOSIS — Z4651 Encounter for fitting and adjustment of gastric lap band: Secondary | ICD-10-CM

## 2011-02-05 NOTE — Progress Notes (Signed)
[name] comes in today for evaluation for lab and fill. She has some difficulty with certain chickens. Her weight is a stockinette to await. In talking with her it sounds like she has been having carbs to her diet. I told her to go back to a high protein low carb diet. I went ahead and gave her about a 0.3 cc to her band. Return 6 weeks

## 2011-02-06 LAB — VITAMIN D 1,25 DIHYDROXY
Vitamin D 1, 25 (OH)2 Total: 59 pg/mL (ref 18–72)
Vitamin D2 1, 25 (OH)2: <8 pg/mL
Vitamin D3 1, 25 (OH)2: 59 pg/mL

## 2011-02-08 ENCOUNTER — Telehealth: Payer: Self-pay | Admitting: Family Medicine

## 2011-02-08 NOTE — Telephone Encounter (Signed)
Pt aware of labs and would like rx for b12 called to the pharmacy so she can do injections herself.

## 2011-02-08 NOTE — Telephone Encounter (Signed)
Message copied by Doristine Devoid on Mon Feb 08, 2011  8:12 AM ------      Message from: Lelon Perla      Created: Fri Feb 05, 2011  2:42 PM       b12 low normal----can try B12 SL daily or we can do injections      Recheck 1 month      Rest of labs are great!  Recheck 6 months-----272.4 250.00  Lipid, hep, bmp, hgba1c,  266.0 b12

## 2011-02-09 MED ORDER — CYANOCOBALAMIN 1000 MCG/ML IJ SOLN: 1000.0000 ug | INTRAMUSCULAR | Status: DC

## 2011-02-09 MED ORDER — "SYRINGE/NEEDLE (DISP) 25G X 5/8"" 3 ML MISC": 1.0000 mL | Status: DC

## 2011-02-09 NOTE — Telephone Encounter (Signed)
Rx sent to pharmacy   

## 2011-02-19 ENCOUNTER — Ambulatory Visit (INDEPENDENT_AMBULATORY_CARE_PROVIDER_SITE_OTHER): Payer: Commercial Managed Care - PPO | Admitting: Physician Assistant

## 2011-02-19 ENCOUNTER — Encounter (INDEPENDENT_AMBULATORY_CARE_PROVIDER_SITE_OTHER): Payer: Self-pay

## 2011-02-19 VITALS — BP 120/76 | Ht 71.0 in | Wt 211.0 lb

## 2011-02-19 DIAGNOSIS — Z4651 Encounter for fitting and adjustment of gastric lap band: Secondary | ICD-10-CM

## 2011-02-19 DIAGNOSIS — R1110 Vomiting, unspecified: Secondary | ICD-10-CM

## 2011-02-19 DIAGNOSIS — IMO0001 Reserved for inherently not codable concepts without codable children: Secondary | ICD-10-CM

## 2011-02-19 NOTE — Patient Instructions (Signed)
Return in 2 months or sooner if necessary. 

## 2011-02-19 NOTE — Progress Notes (Signed)
  HISTORY: [name] is a 55 y.o.female who received an AP-large lap-band in June 2011 by Dr. [name]. She was seen at the end of July for a fill and since then has been having significant regurgitation and intolerance of solids. She has occasional dysphagia of liquids as well.  VITAL SIGNS: Filed Vitals:   02/19/11 1600  BP: 120/76    PHYSICAL EXAM: Physical exam reveals a very well-appearing 55 y.o.female in no apparent distress Neurologic: Awake, alert, oriented Psych: Bright affect, conversant Respiratory: Breathing even and unlabored. No stridor or wheezing Abdomen: Soft, nontender, nondistended to palpation. Incisions well-healed. No incisional hernias. Port easily palpated. Extremities: Atraumatic, good range of motion.  ASSESMENT: 55 y.o.  female  s/p AP-Large lap-band.   PLAN: 0.3 mL of fluid was removed from her band after which she was able to swallow water with ease. She felt a difference in the ability to swallow. She's scheduled to return in 2 months or sooner if needed.

## 2011-04-05 ENCOUNTER — Encounter: Payer: Self-pay | Admitting: Family Medicine

## 2011-04-05 ENCOUNTER — Inpatient Hospital Stay (INDEPENDENT_AMBULATORY_CARE_PROVIDER_SITE_OTHER)
Admission: RE | Admit: 2011-04-05 | Discharge: 2011-04-05 | Disposition: A | Discharge status: Home / Self Care | Payer: Commercial Managed Care - PPO | Source: Ambulatory Visit | Attending: Family Medicine | Admitting: Family Medicine

## 2011-04-05 ENCOUNTER — Inpatient Hospital Stay (INDEPENDENT_AMBULATORY_CARE_PROVIDER_SITE_OTHER)
Admission: RE | Admit: 2011-04-05 | Payer: Commercial Managed Care - PPO | Source: Ambulatory Visit | Admitting: Emergency Medicine

## 2011-04-05 DIAGNOSIS — J069 Acute upper respiratory infection, unspecified: Secondary | ICD-10-CM

## 2011-04-07 ENCOUNTER — Telehealth (INDEPENDENT_AMBULATORY_CARE_PROVIDER_SITE_OTHER): Payer: Self-pay | Admitting: Emergency Medicine

## 2011-04-08 ENCOUNTER — Encounter (INDEPENDENT_AMBULATORY_CARE_PROVIDER_SITE_OTHER): Payer: Self-pay | Admitting: *Deleted

## 2011-04-08 ENCOUNTER — Inpatient Hospital Stay (INDEPENDENT_AMBULATORY_CARE_PROVIDER_SITE_OTHER)
Admission: RE | Admit: 2011-04-08 | Discharge: 2011-04-08 | Disposition: A | Discharge status: Home / Self Care | Payer: Commercial Managed Care - PPO | Source: Ambulatory Visit | Attending: Emergency Medicine | Admitting: Emergency Medicine

## 2011-04-08 ENCOUNTER — Encounter: Payer: Self-pay | Admitting: Emergency Medicine

## 2011-04-08 DIAGNOSIS — J209 Acute bronchitis, unspecified: Secondary | ICD-10-CM

## 2011-04-09 ENCOUNTER — Emergency Department (HOSPITAL_BASED_OUTPATIENT_CLINIC_OR_DEPARTMENT_OTHER)
Admission: EM | Admit: 2011-04-09 | Discharge: 2011-04-09 | Disposition: A | Discharge status: Home / Self Care | Payer: 59 | Attending: Emergency Medicine | Admitting: Emergency Medicine

## 2011-04-09 ENCOUNTER — Emergency Department (INDEPENDENT_AMBULATORY_CARE_PROVIDER_SITE_OTHER): Payer: 59

## 2011-04-09 ENCOUNTER — Encounter (HOSPITAL_BASED_OUTPATIENT_CLINIC_OR_DEPARTMENT_OTHER): Payer: Self-pay | Admitting: Family Medicine

## 2011-04-09 DIAGNOSIS — K589 Irritable bowel syndrome without diarrhea: Secondary | ICD-10-CM | POA: Insufficient documentation

## 2011-04-09 DIAGNOSIS — R0602 Shortness of breath: Secondary | ICD-10-CM | POA: Insufficient documentation

## 2011-04-09 DIAGNOSIS — R509 Fever, unspecified: Secondary | ICD-10-CM

## 2011-04-09 DIAGNOSIS — IMO0001 Reserved for inherently not codable concepts without codable children: Secondary | ICD-10-CM | POA: Insufficient documentation

## 2011-04-09 DIAGNOSIS — J385 Laryngeal spasm: Secondary | ICD-10-CM

## 2011-04-09 DIAGNOSIS — R05 Cough: Secondary | ICD-10-CM

## 2011-04-09 DIAGNOSIS — R059 Cough, unspecified: Secondary | ICD-10-CM

## 2011-04-09 DIAGNOSIS — K219 Gastro-esophageal reflux disease without esophagitis: Secondary | ICD-10-CM | POA: Insufficient documentation

## 2011-04-09 DIAGNOSIS — E119 Type 2 diabetes mellitus without complications: Secondary | ICD-10-CM | POA: Insufficient documentation

## 2011-04-09 MED ORDER — ALBUTEROL SULFATE (5 MG/ML) 0.5% IN NEBU
5.0000 mg | INHALATION_SOLUTION | Freq: Once | RESPIRATORY_TRACT | Status: AC
  Administered 2011-04-09: 5 mg via RESPIRATORY_TRACT
  Filled 2011-04-09: qty 1

## 2011-04-09 MED ORDER — ALBUTEROL SULFATE HFA 108 (90 BASE) MCG/ACT IN AERS: 1.0000 | INHALATION_SPRAY | Freq: Four times a day (QID) | RESPIRATORY_TRACT | Status: DC | PRN

## 2011-04-09 NOTE — ED Notes (Addendum)
Pt present with shortness of breath and reports having "laryngeal spasm" this morning. Pt sts she has been on antibiotics since Monday morning for fever and cough. Pt sts cough productive of green sputum. Pt able to speak in complete sentences. O2 sats 99%. Pt also c/o "sore throat and feeling like my ears have pressure".

## 2011-04-09 NOTE — ED Provider Notes (Signed)
History     CSN: [id number] Arrival date & time: 04/09/2011  9:26 AM Pt seen at 0932am Chief Complaint  Patient presents with  . Shortness of Breath    (Consider location/radiation/quality/duration/timing/severity/associated sxs/prior treatment) Patient is a 55 y.o. female presenting with cough.  Cough This is a new problem. The current episode started more than 2 days ago. The problem occurs every few minutes. The problem has not changed since onset.The cough is productive of blood-tinged sputum. Associated symptoms include chills, ear congestion, rhinorrhea, shortness of breath and wheezing. Pertinent negatives include no chest pain.  Pt reports earlier this week developing cough/congestion Seen at an urgent care, given azithromycin She did not improve, and went to same urgent care yesterday She was given levaquin, depo-medrol injection and Rx for prednisone She reports cough is worsening, and this morning during coughing episode she felt "short of breath with throat closing off" and these episodes last about 10 seconds and she has had at least two episodes since Denies dyspnea on exertion No CP No weakness No syncope NO CYANOSIS IS REPORTED She is able to swallow fluids without difficulty  Past Medical History  Diagnosis Date  . Allergic rhinitis   . Diabetes mellitus type II   . Headache   . Menopause   . GERD (gastroesophageal reflux disease)   . IBS (irritable bowel syndrome)   . Anxiety   . Depression   . Fibromyalgia   . Polyarthralgia     Past Surgical History  Procedure Date  . Cesarean section   . Cervical laminectomy   . Cholecystectomy   . Gyn surgery   . Tubal ligation   . Tonsillectomy   . Foot surgery   . Right knee   . Nasal sinus surgery   . Laparoscopic gastric banding   . Abdominal hysterectomy     Family History  Problem Relation Age of Onset  . Diabetes Mother   . Diabetes Father   . Stroke Mother   . Breast cancer Mother   . Atrial  fibrillation Mother   . Hypertension Mother   . Stroke Father   . Hypertension Father   . Thyroid disease    . Heart disease      History  Substance Use Topics  . Smoking status: Never Smoker   . Smokeless tobacco: Never Used  . Alcohol Use: No    OB History    Grav Para Term Preterm Abortions TAB SAB Ect Mult Living                  Review of Systems  Constitutional: Positive for chills.  HENT: Positive for rhinorrhea.   Respiratory: Positive for cough, shortness of breath and wheezing.   Cardiovascular: Negative for chest pain.  All other systems reviewed and are negative.    Allergies  Codeine phosphate; Fentanyl; Morphine; Morphine sulfate; Nitrofurantoin; Oxycodone-acetaminophen; Penicillins; Sulfamethoxazole w/trimethoprim; and Trimethoprim  Home Medications   Current Outpatient Rx  Name Route Sig Dispense Refill  . LEVAQUIN PO Oral Take by mouth.      Marland Kitchen CITALOPRAM HYDROBROMIDE 20 MG PO TABS  TAKE 1 TABLET BY MOUTH DAILY 90 tablet 3  . CRESTOR 20 MG PO TABS  TAKE 1 TABLET BY MOUTH ONCE DAILY 90 tablet 3  . CYANOCOBALAMIN 1000 MCG/ML IJ SOLN Intramuscular Inject 1 mL (1,000 mcg total) into the muscle once a week. 10 mL 0    For 4 weeks then monthly, then recheck level  . CYCLOBENZAPRINE HCL 10 MG  PO TABS Oral Take 10 mg by mouth 3 (three) times daily as needed.      Marland Kitchen ESTRADIOL 0.1 MG/24HR TD PTTW Transdermal Place 1 patch (0.1 mg total) onto the skin 2 (two) times a week. 8 patch 11  . FUROSEMIDE 20 MG PO TABS  1 po qd prn 90 tablet 3  . GLUCOSE BLOOD VI STRP  Test 3 times per day as directed     . INSULIN PEN NEEDLE 31G X 5 MM MISC Subcutaneous Inject 1.2 Units into the skin as directed. 100 each 11  . INSULIN PEN NEEDLE 30G X 8 MM MISC Subcutaneous Inject 1 packet into the skin 3 (three) times daily.      Marland Kitchen KLOR-CON M10 10 MEQ PO TBCR  TAKE 1 TABLET BY MOUTH DAILY 90 tablet 3  . LANCET DEVICE MISC  "avia" check three times a day      . LIRAGLUTIDE 18 MG/3ML  [place] SOLN Subcutaneous Inject 0.2 mLs (1.2 mg total) into the skin daily. 6 mL 1  . LORATADINE 10 MG PO TABS Oral Take 10 mg by mouth daily.      Marland Kitchen OLMESARTAN 10 MG HALF TABLET Oral Take 0.5 tablets (10 mg total) by mouth daily. 30 tablet 5  . SYRINGE/NEEDLE (DISP) 25G X 5/8" 3 ML MISC Injection Inject 1 mL as directed once a week. 5 each 1    BP 150/91  Pulse 88  Temp(Src) 99.1 F (37.3 C) (Oral)  Resp 20  Ht 5\' 11"  (1.803 m)  Wt 211 lb (95.709 kg)  BMI 29.43 kg/m2  SpO2 98%  Physical Exam  CONSTITUTIONAL: Well developed/well nourished HEAD AND FACE: Normocephalic/atraumatic EYES: EOMI/PERRL ENMT: Mucous membranes moist, nasal congestion No stridor Voice normal Uvula midline, pharynx normal NECK: supple no meningeal signs, no soft tissue swelling, no crepitus SPINE:c-spine nontender CV: S1/S2 noted, no murmurs/rubs/gallops noted LUNGS: Rhonchi noted bilaterally, no tachypnea, patient is able to speak clearly and without difficulty ABDOMEN: soft, nontender, no rebound or guarding NEURO: Pt is awake/alert, moves all extremitiesx4 EXTREMITIES: pulses normal, full ROM SKIN: warm, color normal PSYCH: no abnormalities of mood noted   ED Course  Procedures (including critical care time)  Labs Reviewed - No data to display Dg Neck Soft Tissue  04/09/2011  *RADIOLOGY REPORT*  Clinical Data: Short of breath.  Cough.  Fever.  Laryngeal spasms.  NECK SOFT TISSUES - 1+ VIEW  Comparison: Cervical spine CT of 04/25/2008.  Findings: Single lateral soft tissue view of the neck.  Status post C5-C7 anterior fixation.  C3-C5 fixation as well.  Prominent anterior osteophyte at C4-C5. Prevertebral soft tissues are within normal limits.  Epiglottis within normal limits.  Airway widely patent.  IMPRESSION: Postsurgical changes, without acute finding.  Original Report Authenticated By: Consuello Bossier, M.D.   Dg Chest 2 View  04/09/2011  *RADIOLOGY REPORT*  Clinical Data: Cough.  Laryngeal spasms.  Fever.  Diabetes. Shortness of breath.  CHEST - 2 VIEW  Comparison: 09/30/2009  Findings: Lower cervical spine fixation.  Moderate lower thoracic spondylosis. Midline trachea.  Normal heart size and mediastinal contours. No pleural effusion or pneumothorax.  Mild biapical pleural thickening. Clear lungs.  IMPRESSION: No acute cardiopulmonary disease.  Original Report Authenticated By: Consuello Bossier, M.D.        MDM  Nursing notes reviewed and considered in documentation xrays reviewed and considered - negative for acute disease  10:30 AM] Patient is well-appearing on initial presentation She did have one episode of coughing  while I was in room, no stridor and no cyanosis We'll try an albuterol nebulizer to see if this relieves her symptoms    12:25 PM PT IMPROVED, RESTING COMFORTABLY, SHE HAS HAD FEWER COUGHING EPISODES IN THE ED SUSPICION FOR SIGNIFICANT AIRWAY OBSTRUCTION IS LOW VITALS APPROPRIATE DISCUSSED AT LENGTH SIGNS/SYMPTOMS OF WHEN TO RETURN  The patient appears reasonably screened and/or stabilized for discharge and I doubt any other medical condition or other [facility] requiring further screening, evaluation, or treatment in the ED at this time prior to discharge.   Joya Gaskins, MD 04/09/11 1228

## 2011-04-14 LAB — CBC
HCT: 36.9
Hemoglobin: 12.3
MCHC: 33.5
MCV: 81.5
Platelets: 327
RBC: 4.53
RDW: 14.5
WBC: 10

## 2011-04-14 LAB — BASIC METABOLIC PANEL
BUN: 6
CO2: 27
Calcium: 9.4
Chloride: 103
Creatinine, Ser: 0.73
GFR calc Af Amer: >60
GFR calc non Af Amer: >60
Glucose, Bld: 125 — ABNORMAL HIGH
Potassium: 4.7
Sodium: 137

## 2011-04-14 LAB — GLUCOSE, CAPILLARY
Glucose-Capillary: 131 — ABNORMAL HIGH
Glucose-Capillary: 168 — ABNORMAL HIGH
Glucose-Capillary: 179 — ABNORMAL HIGH
Glucose-Capillary: 272 — ABNORMAL HIGH

## 2011-04-16 ENCOUNTER — Encounter (INDEPENDENT_AMBULATORY_CARE_PROVIDER_SITE_OTHER): Payer: Self-pay | Admitting: Physician Assistant

## 2011-04-16 ENCOUNTER — Ambulatory Visit (INDEPENDENT_AMBULATORY_CARE_PROVIDER_SITE_OTHER): Payer: Commercial Managed Care - PPO | Admitting: Physician Assistant

## 2011-04-16 VITALS — BP 122/72 | HR 60 | Temp 97.4°F | Resp 18 | Ht 71.0 in | Wt 209.4 lb

## 2011-04-16 DIAGNOSIS — Z4651 Encounter for fitting and adjustment of gastric lap band: Secondary | ICD-10-CM

## 2011-04-16 NOTE — Progress Notes (Signed)
  HISTORY: [name] is a 55 y.o.female who received an AP-Large lap-band in June 2011 by Dr. [name]. She has had an upper respiratory infection over the past couple of weeks and has been on Prednisone and antibiotics. She said otherwise she's had increased hunger and portion sizes and says a fill is needed. She denies vomiting or regurgitation.  VITAL SIGNS: Filed Vitals:   04/16/11 0857  BP: 122/72  Pulse: 60  Temp: 97.4 F (36.3 C)  Resp: 18    PHYSICAL EXAM: Physical exam reveals a very well-appearing 55 y.o.female in no apparent distress Neurologic: Awake, alert, oriented Psych: Bright affect, conversant Respiratory: Breathing even and unlabored. No stridor or wheezing Abdomen: Soft, nontender, nondistended to palpation. Incisions well-healed. No incisional hernias. Port easily palpated. Extremities: Atraumatic, good range of motion.  ASSESMENT: 55 y.o.  female  s/p AP-Large lap-band.   PLAN: The patient's port was accessed with a 20G Huber needle without difficulty. Clear fluid was aspirated and 0.3 mL saline was added to the port.

## 2011-04-16 NOTE — Patient Instructions (Signed)
Take clear liquids for the next 48 hours. Thin protein shakes are ok to start on Saturday evening. Call us if you have persistent vomiting or regurgitation, night cough or reflux symptoms. Return as scheduled or sooner if you notice no changes in hunger/portion sizes.   

## 2011-04-22 ENCOUNTER — Encounter: Payer: Self-pay | Admitting: *Deleted

## 2011-04-22 ENCOUNTER — Encounter: Payer: 59 | Attending: Surgery | Admitting: *Deleted

## 2011-04-22 DIAGNOSIS — Z9884 Bariatric surgery status: Secondary | ICD-10-CM | POA: Insufficient documentation

## 2011-04-22 DIAGNOSIS — Z09 Encounter for follow-up examination after completed treatment for conditions other than malignant neoplasm: Secondary | ICD-10-CM | POA: Insufficient documentation

## 2011-04-22 DIAGNOSIS — Z713 Dietary counseling and surveillance: Secondary | ICD-10-CM | POA: Insufficient documentation

## 2011-04-22 NOTE — Progress Notes (Signed)
  Follow-up visit: 15 Months Post-Operative LAGB Surgery  Medical Nutrition Therapy:  Appt start time: 0930 end time:  1000.  Assessment:  Primary concerns today: post-operative bariatric surgery nutrition management. [name] reports multiple issues with eating s/p having an asthma "flare-up" and her most recent fill. She reports frequent hiccups, nausea, vomiting, and regurgitation after eating. She notes good chewing and slow eating paces. She believes that the effects of her band fills are seen most often times 2 weeks after the fill occurs. [name] plans to continue on soft, moist proteins until she become used to her fill.  Weight today: 211.8 lbs Weight change: 7.2 lb gain Total weight lost: 50.8 lbs total BMI: 29.6% Weight goal: 180 lbs   Food recall: No food recall given at time of visit. Pt reports that she is unable to eat many solids since her recent fill and often consumes protein shakes, yogurt, and chili.  Fluid intake: water, protein supplements = 64 oz Estimated total protein intake: 60-65g  Medications: See medication list Supplementation: Taking regularly  Using straws: No Drinking while eating:No Hair loss: No Carbonated beverages: No N/V/D/C: Regurgitation noted with consuming any solid meat s/p band fill Last Lap-Band fill: Recent fill last week per Mardelle Matte  Recent physical activity:  Walking multiple times/week for 30-45 mins  Progress Towards Goal(s):  Some progress.  Handouts given during visit include:  Soft-moist protein food recipes  Modified Pre-Op Diet   Nutritional Diagnosis:  [place]-3.3 Overweight/obesity As related to recent LAGB surgery.  As evidenced by pt following LAGB dietary guidelines for continued weight loss.    Intervention:  Nutrition education.  Monitoring/Evaluation:  Dietary intake, exercise, lap band fills, and body weight. Follow up in 2 months for 17 month post-op visit.

## 2011-04-22 NOTE — Patient Instructions (Signed)
Goals:  Follow Phase 3B: High Protein + Non-Starchy Vegetables  Eat 3-6 small meals/snacks, every 3-5 hrs  Increase lean protein foods to meet 75-90g goal  Increase fluid intake to 64oz +  Add 15 grams of carbohydrate (fruit, whole grain, starchy vegetable) with meals  Avoid drinking 15 minutes before, during and 30 minutes after eating  Aim for >30 min of physical activity daily

## 2011-05-06 ENCOUNTER — Other Ambulatory Visit (HOSPITAL_COMMUNITY): Payer: Self-pay | Admitting: Obstetrics and Gynecology

## 2011-05-06 DIAGNOSIS — Z1231 Encounter for screening mammogram for malignant neoplasm of breast: Secondary | ICD-10-CM

## 2011-05-19 ENCOUNTER — Ambulatory Visit (INDEPENDENT_AMBULATORY_CARE_PROVIDER_SITE_OTHER): Payer: Commercial Managed Care - PPO | Admitting: Surgery

## 2011-05-19 DIAGNOSIS — Z9884 Bariatric surgery status: Secondary | ICD-10-CM

## 2011-05-19 NOTE — Progress Notes (Signed)
[name] 55 y.o.  There is no height or weight on file to calculate BMI.  Patient Active Problem List  Diagnoses  . ONYCHOMYCOSIS, BILATERAL  . DIABETES MELLITUS, TYPE II  . VITAMIN B12 DEFICIENCY  . UNSPECIFIED VITAMIN D DEFICIENCY  . HYPERLIPIDEMIA NEC/NOS  . MORBID OBESITY  . OTHER SPECIFIED ANEMIAS  . ANXIETY  . DEPRESSION  . HYPERTENSION, ESSENTIAL NOS  . ACUTE SINUSITIS, UNSPECIFIED  . ACUTE PHARYNGITIS  . BRONCHITIS, ACUTE  . ALLERGIC RHINITIS  . CELLULITIS  . Pain in Joint, Multiple Sites  . BACK PAIN, LUMBAR  . Unspecified Myalgia and Myositis  . SKIN RASH  . HEADACHE  . COUGH  . NAUSEA  . DIARRHEA  . STRESS INCONTINENCE  . BARIATRIC SURGERY STATUS  . ABDOMINAL PAIN, ACUTE    Allergies  Allergen Reactions  . Codeine Phosphate     REACTION: unspecified  . Fentanyl     REACTION: whelps  . Morphine     REACTION: severe vomiting  . Morphine Sulfate     REACTION: unspecified  . Nitrofurantoin     REACTION: whelps  . Oxycodone-Acetaminophen     REACTION: severe itching  . Penicillins     REACTION: unspecified  . Sulfamethoxazole W/Trimethoprim     REACTION: rash  . Trimethoprim     REACTION: unspecified    Past Surgical History  Procedure Date  . Cesarean section   . Cervical laminectomy   . Cholecystectomy   . Gyn surgery   . Tubal ligation   . Tonsillectomy   . Foot surgery   . Right knee   . Nasal sinus surgery   . Laparoscopic gastric banding   . Abdominal hysterectomy    [name], DO, DO No diagnosis found.  Today [name] is 16.8 months from her lab and APL with repair of hiatal hernia. Today his BMI is 29.5. She's been having some problems with nocturnal reflux and report is been hurting. Her port is not red or swollen. She has been having some fluid retention as she can notice in her hands. She had a respiratory tract infection a few weeks ago with a lot of coughing and had equal and steroids as well as antibiotics. I went  ahead and removed 0.9 cc of fluid from pain. This was where the tuberculin syringe and push back to. She is scheduled to see me in January. If she needs it she will contact me he'll see her sooner. [name], MD, [facility] [phone] beeper [phone]  05/19/2011 12:04 PM

## 2011-05-26 ENCOUNTER — Ambulatory Visit (HOSPITAL_BASED_OUTPATIENT_CLINIC_OR_DEPARTMENT_OTHER)
Admission: RE | Admit: 2011-05-26 | Discharge: 2011-05-26 | Disposition: A | Discharge status: Home / Self Care | Payer: 59 | Source: Ambulatory Visit | Attending: Obstetrics and Gynecology | Admitting: Obstetrics and Gynecology

## 2011-05-26 DIAGNOSIS — Z1231 Encounter for screening mammogram for malignant neoplasm of breast: Secondary | ICD-10-CM | POA: Insufficient documentation

## 2011-05-27 ENCOUNTER — Ambulatory Visit (HOSPITAL_COMMUNITY): Payer: Commercial Managed Care - PPO

## 2011-06-07 ENCOUNTER — Telehealth (INDEPENDENT_AMBULATORY_CARE_PROVIDER_SITE_OTHER): Payer: Self-pay | Admitting: General Surgery

## 2011-06-07 NOTE — Telephone Encounter (Signed)
The patient contacted the office and left a message on my voicemail c/o pain at her port site and is having reflux. She is having pain all the time now not just intermittent. Please advise me on what to do.

## 2011-06-09 NOTE — Telephone Encounter (Signed)
Need to get UGI and see in office

## 2011-06-11 ENCOUNTER — Other Ambulatory Visit (INDEPENDENT_AMBULATORY_CARE_PROVIDER_SITE_OTHER): Payer: Self-pay | Admitting: General Surgery

## 2011-06-11 DIAGNOSIS — R112 Nausea with vomiting, unspecified: Secondary | ICD-10-CM

## 2011-06-14 ENCOUNTER — Ambulatory Visit (HOSPITAL_COMMUNITY)
Admission: RE | Admit: 2011-06-14 | Discharge: 2011-06-14 | Disposition: A | Discharge status: Home / Self Care | Payer: 59 | Source: Ambulatory Visit | Attending: Surgery | Admitting: Surgery

## 2011-06-14 DIAGNOSIS — K224 Dyskinesia of esophagus: Secondary | ICD-10-CM | POA: Insufficient documentation

## 2011-06-14 DIAGNOSIS — Z9884 Bariatric surgery status: Secondary | ICD-10-CM | POA: Insufficient documentation

## 2011-06-14 DIAGNOSIS — K219 Gastro-esophageal reflux disease without esophagitis: Secondary | ICD-10-CM | POA: Insufficient documentation

## 2011-06-14 DIAGNOSIS — R109 Unspecified abdominal pain: Secondary | ICD-10-CM | POA: Insufficient documentation

## 2011-06-14 DIAGNOSIS — R112 Nausea with vomiting, unspecified: Secondary | ICD-10-CM | POA: Insufficient documentation

## 2011-06-14 NOTE — Progress Notes (Signed)
Summary: sore throat/ears/fever Room 3   Vital Signs:  Patient Profile:   55 Years Old Female CC:      Productive cough, headache, fever, sore throat, x 3 days Height:     71 inches Weight:      211 pounds O2 Sat:      98 % O2 treatment:    Room Air Temp:     98.9 degrees F oral Pulse rate:   72 / minute Pulse rhythm:   regular Resp:     18 per minute BP sitting:   147 / 80  (left arm) Cuff size:   regular  Vitals Entered By: Emilio Math (April 05, 2011 10:55 AM)                  Current Allergies (reviewed today): ! SEPTRA ! BACTRIM ! MACRODANTIN ! MORPHINE ! * FENTANYL ! PERCOCET CODEINE PHOSPHATE (CODEINE PHOSPHATE) * INH * IVP DYE MORPHINE SULFATE (MORPHINE SULFATE) PENICILLIN G POTASSIUM (PENICILLIN G POTASSIUM) TRIMETHOPRIM (TRIMETHOPRIM)History of Present Illness Chief Complaint: Productive cough, headache, fever, sore throat, x 3 days History of Present Illness:  Subjective: Patient complains of sore throat for 2 days.  She has a history of bronchospasm with respiratory infections, and bronchitis + cough No pleuritic pain No wheezing + nasal congestion ? post-nasal drainage No sinus pain/pressure No itchy/red eyes No earache but feels pressure in ears No hemoptysis No SOB + fever/chills today No nausea No vomiting No abdominal pain No diarrhea No skin rashes + fatigue No myalgias No headache Used OTC meds without relief   Current Meds BENICAR 20 MG TABS (OLMESARTAN MEDOXOMIL) 1 by mouth qd LORATADINE 10 MG TABS (LORATADINE) Take 1 tablet by mouth once a day VIVELLE-DOT 0.1 MG/24HR PTTW (ESTRADIOL) 1 patch 2x a week CRESTOR 20 MG  TABS (ROSUVASTATIN CALCIUM) 1 by mouth once daily VICTOZA 18 MG/3ML SOLN (LIRAGLUTIDE) Use 1.2 mg daily * NOVAFINE 30 GAUGE NEEDLES PT TEST 3 X A DAY BD PEN NEEDLE MINI U/F 31G X 5 MM MISC (INSULIN PEN NEEDLE) AS DIRECTED ACCU-CHEK AVIVA  STRP (GLUCOSE BLOOD) test 3x per day as directed ACCU-CHEK AVIVA   KIT (BLOOD GLUCOSE MONITORING SUPPL) use as directed daily * BARIATRIC ADVANTAGE CRYSTALS MULTIVITAMIN WITH CALSIUM once daily  AS DIRECTED BD ECLIPSE SYRINGE 25G X 5/8" 3 ML MISC (SYRINGE/NEEDLE (DISP)) as directed * AVIA LANCETS accu check three times a day AZITHROMYCIN 250 MG TABS (AZITHROMYCIN) Two tabs by mouth on day 1, then 1 tab daily on days 2 through 5 TUSSIONEX PENNKINETIC ER 8-10 MG/5ML LQCR (CHLORPHENIRAMINE-HYDROCODONE) 5 cc by mouth hs as needed cough  REVIEW OF SYSTEMS Constitutional Symptoms       Complains of fever and fatigue.     Denies chills, night sweats, weight loss, and weight gain.  Eyes       Denies change in vision, eye pain, eye discharge, glasses, contact lenses, and eye surgery. Ear/Nose/Throat/Mouth       Complains of ear pain, sore throat, and hoarseness.      Denies hearing loss/aids, change in hearing, ear discharge, dizziness, frequent runny nose, frequent nose bleeds, sinus problems, and tooth pain or bleeding.  Respiratory       Complains of productive cough.      Denies dry cough, wheezing, shortness of breath, asthma, bronchitis, and emphysema/COPD.  Cardiovascular       Denies murmurs, chest pain, and tires easily with exhertion.    Gastrointestinal       Denies stomach  pain, nausea/vomiting, diarrhea, constipation, blood in bowel movements, and indigestion. Genitourniary       Denies painful urination, kidney stones, and loss of urinary control. Neurological       Complains of headaches.      Denies paralysis, seizures, and fainting/blackouts. Musculoskeletal       Denies muscle pain, joint pain, joint stiffness, decreased range of motion, redness, swelling, muscle weakness, and gout.  Skin       Denies bruising, unusual mles/lumps or sores, and hair/skin or nail changes.  Psych       Denies mood changes, temper/anger issues, anxiety/stress, speech problems, depression, and sleep problems.  Past History:  Past Medical History: Reviewed  history from 10/13/2009 and no changes required. Allergic rhinitis Diabetes mellitus, type II Headache menopause gerd ibs Anxiety Depression Fibromyalgia Polyarthalgia  Past Surgical History: Reviewed history from 01/29/2010 and no changes required. Caesarean section Cervical laminectomy Cholecystectomy GYN surgery Hysterectomy Tubal ligation Tonsillectomy Foot surgery Rt Knee Sinus surgery Lap Band surgery  Family History: Reviewed history from 11/04/2008 and no changes required. Family History Diabetes 1st degree relative (both parents) Mother, D, Stroke, Breast CA, A-fib, HTN, Diabetes Father,D, Stroke, HTn, Diabetes, Thyroid, Heart disese  Social History: Reviewed history from 11/04/2008 and no changes required. 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  Extraocular movement is intact.  Conjunctivae are not inflamed.  Ears:  Canals normal.  Tympanic membranes normal.   Nose:  Mildly congested turbinates.  No sinus tenderness  Pharynx:  Normal  Neck:  Supple.  Slightly tender shotty posterior nodes are palpated bilaterally.  Lungs:  Clear to auscultation.  Breath sounds are equal.  Chest:  Mild tenderness over sternu Heart:  Regular rate and rhythm without murmurs, rubs, or gallops.  Abdomen:  Nontender without masses or hepatosplenomegaly.  Bowel sounds are present.  No CVA or flank tenderness.  Extremities:  No edema.   Skin:  No rash Rapid strep test negative  Assessment New Problems: UPPER RESPIRATORY INFECTION, ACUTE (ICD-465.9) BRONCHIOLITIS, ACUTE, VIRAL (ICD-466.0)   Plan New Medications/Changes: Sandria Senter ER 8-10 MG/5ML LQCR (CHLORPHENIRAMINE-HYDROCODONE) 5 cc by mouth hs as needed cough  #2oz x 0, 04/05/2011, Donna Christen MD AZITHROMYCIN 250 MG TABS (AZITHROMYCIN) Two tabs by mouth on day 1, then 1 tab  daily on days 2 through 5  #6 tabs x 0, 04/05/2011, Donna Christen MD  New Orders: Pulse Oximetry (single measurment) [94760] Rapid Strep [[address]] Est. Patient Level III [60454] Planning Comments:   Begin Z-pack, expectorant/decongestant, cough suppressant at bedtime.  Increase fluid intake Followup with PCP if not improving 7 to 10 days   The patient and/or caregiver has been counseled thoroughly with regard to medications prescribed including dosage, schedule, interactions, rationale for use, and possible side effects and they verbalize understanding.  Diagnoses and expected course of recovery discussed and will return if not improved as expected or if the condition worsens. Patient and/or caregiver verbalized understanding.  Prescriptions: Sandria Senter ER 8-10 MG/5ML LQCR (CHLORPHENIRAMINE-HYDROCODONE) 5 cc by mouth hs as needed cough  #2oz x 0   Entered and Authorized by:   Donna Christen MD   Signed by:   Donna Christen MD on 04/05/2011   Method used:   Print then Give to Patient   RxID:   [id number] AZITHROMYCIN 250 MG TABS (AZITHROMYCIN) Two tabs by mouth on day 1,  then 1 tab daily on days 2 through 5  #6 tabs x 0   Entered and Authorized by:   Donna Christen MD   Signed by:   Donna Christen MD on 04/05/2011   Method used:   Print then Give to Patient   RxID:   [id number]   Patient Instructions: 1)  Take Mucinex D (guaifenesin with decongestant) in the morning, and plain Mucinex in the evening for cough and congestion. 2)  Increase fluid intake, rest. 3)  May use Afrin nasal spray (or generic oxymetazoline) twice daily for about 5 days.  Also recommend using saline nasal spray several times daily and/or saline nasal irrigation. 4)  Followup with family doctor if not improving 7 to 10 days.   Orders Added: 1)  Pulse Oximetry (single measurment) [94760] 2)  Rapid Strep [78469] 3)  Est. Patient Level III [62952]  Appended Document: sore throat/ears/fever Room  3 Rapid strep: Negative

## 2011-06-14 NOTE — Telephone Encounter (Signed)
  Phone Note Outgoing Call   Call placed by: Lavell Islam RN,  April 07, 2011 4:09 PM Call placed to: Patient Action Taken: Phone Call Completed Summary of Call: Spoke with patient who states she still is febrile but is taking meds and wonders if she has Flu. May have to return tomorrow if she does not well to get note. Initial call taken by: Lavell Islam RN,  April 07, 2011 4:11 PM

## 2011-06-14 NOTE — Letter (Signed)
Summary: Out of Work  [facility]  [address]   [place], [place] [address]   Phone: [phone]  Fax: [phone]    April 08, 2011   Employee:  [name]    To Whom It May Concern:   Due to an acute respiratory illness, please excuse the above named employee from work for the following dates:  04/08/2011 - 04/09/2011  May return: 04/10/2011    If you need additional information, please feel free to contact our office.         Sincerely,    Lajean Saver RN

## 2011-06-14 NOTE — Letter (Signed)
Summary: Out of Work  [facility]  [address]   [place], [place] [address]   Phone: [phone]  Fax: [phone]    April 05, 2011   Employee:  LYNE KHURANA [facility]    To Whom It May Concern:   For Medical reasons, please excuse the above named employee from work today and tomorrow.   If you need additional information, please feel free to contact our office.         Sincerely,    Donna Christen MD

## 2011-06-14 NOTE — Progress Notes (Signed)
Summary: coughing/chest congestion/wheezing   Vital Signs:  Patient Profile:   55 Years Old Female CC:      Cough worse Height:     71 inches O2 Sat:      98 % O2 treatment:    Room Air Temp:     99.5 degrees F oral Pulse rate:   88 / minute Pulse rhythm:   regular Resp:     18 per minute BP sitting:   147 / 81  (left arm) Cuff size:   regular  Vitals Entered By: Emilio Math (April 08, 2011 8:50 AM)                  Current Allergies (reviewed today): ! SEPTRA ! BACTRIM ! MACRODANTIN ! MORPHINE ! * FENTANYL ! PERCOCET CODEINE PHOSPHATE (CODEINE PHOSPHATE) * INH * IVP DYE MORPHINE SULFATE (MORPHINE SULFATE) PENICILLIN G POTASSIUM (PENICILLIN G POTASSIUM) TRIMETHOPRIM (TRIMETHOPRIM)History of Present Illness Chief Complaint: Cough worse History of Present Illness: 55 Years Old Female returns to clinic feeling worse.  She is already on Zpak and cough meds.   No sore throat + cough No pleuritic pain No wheezing + nasal congestion + post-nasal drainage + sinus pain/pressure + chest congestion No itchy/red eyes No earache No hemoptysis No SOB No chills/sweats No fever No nausea No vomiting No abdominal pain No diarrhea No skin rashes + fatigue No myalgias No headache   Current Meds BENICAR 20 MG TABS (OLMESARTAN MEDOXOMIL) 1 by mouth qd LORATADINE 10 MG TABS (LORATADINE) Take 1 tablet by mouth once a day VIVELLE-DOT 0.1 MG/24HR PTTW (ESTRADIOL) 1 patch 2x a week CRESTOR 20 MG  TABS (ROSUVASTATIN CALCIUM) 1 by mouth once daily VICTOZA 18 MG/3ML SOLN (LIRAGLUTIDE) Use 1.2 mg daily * NOVAFINE 30 GAUGE NEEDLES PT TEST 3 X A DAY BD PEN NEEDLE MINI U/F 31G X 5 MM MISC (INSULIN PEN NEEDLE) AS DIRECTED ACCU-CHEK AVIVA  STRP (GLUCOSE BLOOD) test 3x per day as directed ACCU-CHEK AVIVA  KIT (BLOOD GLUCOSE MONITORING SUPPL) use as directed daily * BARIATRIC ADVANTAGE CRYSTALS MULTIVITAMIN WITH CALSIUM once daily  AS DIRECTED BD ECLIPSE SYRINGE 25G X 5/8" 3  ML MISC (SYRINGE/NEEDLE (DISP)) as directed * AVIA LANCETS accu check three times a day AZITHROMYCIN 250 MG TABS (AZITHROMYCIN) Two tabs by mouth on day 1, then 1 tab daily on days 2 through 5 TUSSIONEX PENNKINETIC ER 8-10 MG/5ML LQCR (CHLORPHENIRAMINE-HYDROCODONE) 5 cc by mouth hs as needed cough PREDNISONE (PAK) 10 MG TABS (PREDNISONE) 6 day pack, use as directed LEVOFLOXACIN 500 MG TABS (LEVOFLOXACIN) 1 by mouth daily for 7 days  REVIEW OF SYSTEMS Constitutional Symptoms       Complains of fever.     Denies chills, night sweats, weight loss, weight gain, and fatigue.  Eyes       Denies change in vision, eye pain, eye discharge, glasses, contact lenses, and eye surgery. Ear/Nose/Throat/Mouth       Complains of ear pain, sinus problems, sore throat, and hoarseness.      Denies hearing loss/aids, change in hearing, ear discharge, dizziness, frequent runny nose, frequent nose bleeds, and tooth pain or bleeding.  Respiratory       Complains of productive cough and wheezing.      Denies dry cough, shortness of breath, asthma, bronchitis, and emphysema/COPD.  Cardiovascular       Denies murmurs, chest pain, and tires easily with exhertion.    Gastrointestinal       Complains of nausea/vomiting.  Denies stomach pain, diarrhea, constipation, blood in bowel movements, and indigestion. Genitourniary       Denies painful urination, kidney stones, and loss of urinary control. Neurological       Complains of headaches.      Denies paralysis, seizures, and fainting/blackouts. Musculoskeletal       Denies muscle pain, joint pain, joint stiffness, decreased range of motion, redness, swelling, muscle weakness, and gout.  Skin       Denies bruising, unusual mles/lumps or sores, and hair/skin or nail changes.  Psych       Denies mood changes, temper/anger issues, anxiety/stress, speech problems, depression, and sleep problems.  Past History:  Past Medical History: Reviewed history from  10/13/2009 and no changes required. Allergic rhinitis Diabetes mellitus, type II Headache menopause gerd ibs Anxiety Depression Fibromyalgia Polyarthalgia  Past Surgical History: Reviewed history from 01/29/2010 and no changes required. Caesarean section Cervical laminectomy Cholecystectomy GYN surgery Hysterectomy Tubal ligation Tonsillectomy Foot surgery Rt Knee Sinus surgery Lap Band surgery  Family History: Reviewed history from 11/04/2008 and no changes required. Family History Diabetes 1st degree relative (both parents) Mother, D, Stroke, Breast CA, A-fib, HTN, Diabetes Father,D, Stroke, HTn, Diabetes, Thyroid, Heart disese  Social History: Reviewed history from 11/04/2008 and no changes required. 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 @ [employer] Physical Exam General appearance: well developed, well nourished, no acute distress other than cough Ears: normal, no lesions or deformities Nasal: mucosa pink, nonedematous, no septal deviation, turbinates normal Oral/Pharynx: clear PND, no erythema, no exudate Neck: neck supple,  trachea midline, no masses Chest/Lungs: no rales, wheezes, or rhonchi bilateral, breath sounds equal without effort Heart: regular rate and  rhythm, no murmur MSE: oriented to time, place, and person Assessment New Problems: BRONCHITIS, ACUTE (ICD-466.0)   Plan New Medications/Changes: LEVOFLOXACIN 500 MG TABS (LEVOFLOXACIN) 1 by mouth daily for 7 days  #7 x 0, 04/08/2011, Hoyt Koch MD PREDNISONE (PAK) 10 MG TABS (PREDNISONE) 6 day pack, use as directed  #1 x 0, 04/08/2011, Hoyt Koch MD  New Orders: Est. Patient Level IV [16109] Pulse Oximetry (single measurment) [94760] Solumedrol up to 125mg  [J2930] Admin of Therapeutic Inj  intramuscular or subcutaneous [96372] Planning Comments:   1)  Take the prescribed antibiotic as instructed. 2)  Use nasal saline solution (over the counter) at least 3  times a day. 3)  Use over the counter decongestants like Zyrtec-D every 12 hours as needed to help with congestion. 4)  Can take tylenol every 6 hours or motrin every 8 hours for pain or fever. 5)  Follow up with your primary doctor  if no improvement in 5-7 days, sooner if increasing pain, fever, or new symptoms.    The patient and/or caregiver has been counseled thoroughly with regard to medications prescribed including dosage, schedule, interactions, rationale for use, and possible side effects and they verbalize understanding.  Diagnoses and expected course of recovery discussed and will return if not improved as expected or if the condition worsens. Patient and/or caregiver verbalized understanding.  Prescriptions: LEVOFLOXACIN 500 MG TABS (LEVOFLOXACIN) 1 by mouth daily for 7 days  #7 x 0   Entered and Authorized by:   Hoyt Koch MD   Signed by:   Hoyt Koch MD on 04/08/2011   Method used:   Print then Give to Patient   RxID:   [id number] PREDNISONE (PAK) 10 MG TABS (PREDNISONE) 6 day pack, use as directed  #1 x 0   Entered and Authorized  by:   Hoyt Koch MD   Signed by:   Hoyt Koch MD on 04/08/2011   Method used:   Print then Give to Patient   RxID:   [id number]   Medication Administration  Injection # 1:    Medication: Solumedrol up to 125mg     Diagnosis: BRONCHITIS, ACUTE (ICD-466.0)    Route: IM    Site: RUOQ gluteus    Exp Date: 11/09/2013    Lot #: [id number]    Mfr: Pharm[employer]    Patient tolerated injection without complications    Given by: Lajean Saver RN (April 08, 2011 9:09 AM)  Orders Added: 1)  Est. Patient Level IV [56213] 2)  Pulse Oximetry (single measurment) [94760] 3)  Solumedrol up to 125mg  [J2930] 4)  Admin of Therapeutic Inj  intramuscular or subcutaneous [08657]

## 2011-06-16 ENCOUNTER — Encounter: Payer: Self-pay | Admitting: *Deleted

## 2011-06-16 ENCOUNTER — Encounter: Payer: 59 | Attending: Surgery | Admitting: *Deleted

## 2011-06-16 DIAGNOSIS — Z09 Encounter for follow-up examination after completed treatment for conditions other than malignant neoplasm: Secondary | ICD-10-CM | POA: Insufficient documentation

## 2011-06-16 DIAGNOSIS — Z9884 Bariatric surgery status: Secondary | ICD-10-CM | POA: Insufficient documentation

## 2011-06-16 DIAGNOSIS — Z713 Dietary counseling and surveillance: Secondary | ICD-10-CM | POA: Insufficient documentation

## 2011-06-16 NOTE — Progress Notes (Signed)
  Follow-up visit: 17 Months Post-Operative LAGB Surgery  Medical Nutrition Therapy:  Appt start time: 0730 end time:  0830.  Assessment:  Primary concerns today: post-operative bariatric surgery nutrition management. Jalan has done very well since her LAGB was placed having lost ~50 lbs. In August of 2012 she notes that she began having soreness in her port site with redness and swelling in the beginning. The port pain has persisted yet no visible redness/swelling present. I observed Zamiyah bend over in pain due to, what she reports is, pain at her port site. Later in September 2012 she began having nocturnal reflux, regurgitation, nausea, hiccups, and pain and hese symptoms have persisted since this time. She has had one band fill removal per Dr. [name] with no alleviation from these symptoms. In my opinion she is eating well, displays good behavioral eating patterns, and does not appear to be nutrition related issues. She notes that she has "worked so hard at this for over a year for this to happen, is not feeling good, and working too hard to do right by the band to feel this bad. She notes that she is willing to do whatever it takes to feel better including an exploratory laparoscopy, revision surgery, etc  Weight today: 213.1 lbs Weight change: 1.3 lb gain Total weight lost: 49.5 lbs BMI: 29.8% Weight goal: 180 lbs Start weight at [facility]: 262.6lbs  24-hr recall:  B (8 AM): Protein shake Snk (10-11  AM): yogurt cup   L (12-2 PM): 1/2 cup pureed [place] or broth Snk (3 PM): Protein shake  D (6-7 PM): 1/2 cup soup Snk (PM): piece of cheese  Fluid intake: water, protein supplement = 60 oz + Estimated total protein intake: 45-60g  Medications: No changes Supplementation: Taking regularly  Using straws: No Drinking while eating: No Hair loss: No Carbonated beverages: No N/V/D/C: Reflux, regurgitation, vomiting Last Lap-Band fill: Removal per MD (05/2011)  Recent physical activity:   Walking 30-45 minutes (3-5 times/week) despite very low energy levels  Progress Towards Goal(s):  In progress.   Nutritional Diagnosis:  [place]-1.4 Altered GI function As related to nocturnal reflux potentially associated with LAGB.  As evidenced by with regurgitation, hiccups, nausea, and pain before, after, and between eating.    Intervention:  Nutrition education.  Monitoring/Evaluation:  Dietary intake, exercise, lap band fills, and body weight. Follow up in 3 months for   20 month post-op visit.

## 2011-06-16 NOTE — Patient Instructions (Addendum)
Goals:  Follow Phase 3B: High Protein + Non-Starchy Vegetables  Small, frequent meals (pureed or chopped foods - 5-8 small meals/day)  Protein Supplements  Increase lean protein foods to meet 70-80g goal  Increase fluid intake to 64oz +  Avoid drinking 15 minutes before, during and 30 minutes after eating  Aim for >30 min of physical activity daily  Follow up with MD on LAGB

## 2011-06-17 ENCOUNTER — Ambulatory Visit: Payer: Commercial Managed Care - PPO | Admitting: *Deleted

## 2011-06-18 ENCOUNTER — Ambulatory Visit (INDEPENDENT_AMBULATORY_CARE_PROVIDER_SITE_OTHER): Payer: Commercial Managed Care - PPO | Admitting: Surgery

## 2011-06-18 DIAGNOSIS — Z9884 Bariatric surgery status: Secondary | ICD-10-CM

## 2011-06-18 NOTE — Patient Instructions (Signed)

## 2011-06-18 NOTE — Progress Notes (Signed)
[name] 55 y.o.  There is no height or weight on file to calculate BMI.  Patient Active Problem List  Diagnoses  . ONYCHOMYCOSIS, BILATERAL  . DIABETES MELLITUS, TYPE II  . VITAMIN B12 DEFICIENCY  . UNSPECIFIED VITAMIN D DEFICIENCY  . HYPERLIPIDEMIA NEC/NOS  . MORBID OBESITY  . OTHER SPECIFIED ANEMIAS  . ANXIETY  . DEPRESSION  . HYPERTENSION, ESSENTIAL NOS  . ACUTE SINUSITIS, UNSPECIFIED  . ACUTE PHARYNGITIS  . BRONCHITIS, ACUTE  . ALLERGIC RHINITIS  . CELLULITIS  . Pain in Joint, Multiple Sites  . BACK PAIN, LUMBAR  . Unspecified Myalgia and Myositis  . SKIN RASH  . HEADACHE  . COUGH  . NAUSEA  . DIARRHEA  . STRESS INCONTINENCE  . BARIATRIC SURGERY STATUS  . ABDOMINAL PAIN, ACUTE    Allergies  Allergen Reactions  . Codeine Phosphate     REACTION: unspecified  . Fentanyl     REACTION: whelps  . Morphine     REACTION: severe vomiting  . Morphine Sulfate     REACTION: unspecified  . Nitrofurantoin     REACTION: whelps  . Oxycodone-Acetaminophen     REACTION: severe itching  . Penicillins     REACTION: unspecified  . Sulfamethoxazole W/Trimethoprim     REACTION: rash  . Trimethoprim     REACTION: unspecified    Past Surgical History  Procedure Date  . Cesarean section   . Cervical laminectomy   . Cholecystectomy   . Gyn surgery   . Tubal ligation   . Tonsillectomy   . Foot surgery   . Right knee   . Nasal sinus surgery   . Laparoscopic gastric banding   . Abdominal hysterectomy    Amy Lowne, DO, DO No diagnosis found.  [name] comes in today for a fill. We had an upper GI that shows some reflux in her band seems to be open. Ever since an upper respiratory tract infection in September with coughing her band port has been tender and she intermittently has made a midepigastric pain. Her port site does not look red or infected but it is sore. I accessed it today and added 0.3 cc to the port.    Jim Maxwell read her recent UGI and I  reviewed it.  No evidence of erosion. : Prominent gastroesophageal reflux with esophageal motility  disorder. Otherwise normal exam. Gastric band has been relaxed and  a 13 mm tablet passes without delay.  I will set her up for David to perform an endoscopy to rule out erosion.  I don't have a good explanation for her pain.    [name] Natalynn Pedone, MD, FACS  [facility] [phone] beeper [phone]  06/18/2011 9:11 AM   

## 2011-06-23 ENCOUNTER — Encounter (HOSPITAL_COMMUNITY): Payer: Self-pay

## 2011-06-29 ENCOUNTER — Ambulatory Visit (HOSPITAL_COMMUNITY)
Admission: RE | Admit: 2011-06-29 | Discharge: 2011-06-29 | Disposition: A | Discharge status: Home / Self Care | Payer: 59 | Source: Ambulatory Visit | Attending: Surgery | Admitting: Surgery

## 2011-06-29 ENCOUNTER — Encounter (HOSPITAL_COMMUNITY): Payer: Self-pay | Admitting: *Deleted

## 2011-06-29 ENCOUNTER — Encounter (HOSPITAL_COMMUNITY)
Admission: RE | Disposition: A | Discharge status: Home / Self Care | Payer: Self-pay | Source: Ambulatory Visit | Attending: Surgery

## 2011-06-29 DIAGNOSIS — E119 Type 2 diabetes mellitus without complications: Secondary | ICD-10-CM | POA: Insufficient documentation

## 2011-06-29 DIAGNOSIS — I10 Essential (primary) hypertension: Secondary | ICD-10-CM | POA: Insufficient documentation

## 2011-06-29 DIAGNOSIS — R1013 Epigastric pain: Secondary | ICD-10-CM | POA: Insufficient documentation

## 2011-06-29 DIAGNOSIS — K229 Disease of esophagus, unspecified: Secondary | ICD-10-CM

## 2011-06-29 DIAGNOSIS — Z9884 Bariatric surgery status: Secondary | ICD-10-CM

## 2011-06-29 DIAGNOSIS — R10816 Epigastric abdominal tenderness: Secondary | ICD-10-CM

## 2011-06-29 DIAGNOSIS — E785 Hyperlipidemia, unspecified: Secondary | ICD-10-CM | POA: Insufficient documentation

## 2011-06-29 HISTORY — PX: ESOPHAGOGASTRODUODENOSCOPY: SHX5428

## 2011-06-29 LAB — GLUCOSE, CAPILLARY: Glucose-Capillary: 86 mg/dL (ref 70–99)

## 2011-06-29 SURGERY — EGD (ESOPHAGOGASTRODUODENOSCOPY)
Anesthesia: Moderate Sedation

## 2011-06-29 MED ORDER — SODIUM CHLORIDE 0.9 % IV SOLN
Freq: Once | INTRAVENOUS | Status: AC
  Administered 2011-06-29: 500 mL via INTRAVENOUS

## 2011-06-29 MED ORDER — DIPHENHYDRAMINE HCL 50 MG/ML IJ SOLN
INTRAMUSCULAR | Status: AC
  Filled 2011-06-29: qty 1

## 2011-06-29 MED ORDER — MEPERIDINE HCL 100 MG/ML IJ SOLN
INTRAMUSCULAR | Status: AC
  Filled 2011-06-29: qty 1

## 2011-06-29 MED ORDER — MIDAZOLAM HCL 10 MG/2ML IJ SOLN
INTRAMUSCULAR | Status: AC
  Filled 2011-06-29: qty 2

## 2011-06-29 MED ORDER — MEPERIDINE HCL 25 MG/ML IJ SOLN
INTRAMUSCULAR | Status: DC | PRN
  Administered 2011-06-29 (×2): 25 mg via INTRAVENOUS

## 2011-06-29 MED ORDER — MIDAZOLAM HCL 10 MG/2ML IJ SOLN
INTRAMUSCULAR | Status: DC | PRN
  Administered 2011-06-29 (×2): 2 mg via INTRAVENOUS

## 2011-06-29 MED ORDER — BUTAMBEN-TETRACAINE-BENZOCAINE 2-2-14 % EX AERO
INHALATION_SPRAY | CUTANEOUS | Status: DC | PRN
  Administered 2011-06-29: 3 via TOPICAL

## 2011-06-29 NOTE — H&P (View-Only) (Signed)
[name] 55 y.o.  There is no height or weight on file to calculate BMI.  Patient Active Problem List  Diagnoses  . ONYCHOMYCOSIS, BILATERAL  . DIABETES MELLITUS, TYPE II  . VITAMIN B12 DEFICIENCY  . UNSPECIFIED VITAMIN D DEFICIENCY  . HYPERLIPIDEMIA NEC/NOS  . MORBID OBESITY  . OTHER SPECIFIED ANEMIAS  . ANXIETY  . DEPRESSION  . HYPERTENSION, ESSENTIAL NOS  . ACUTE SINUSITIS, UNSPECIFIED  . ACUTE PHARYNGITIS  . BRONCHITIS, ACUTE  . ALLERGIC RHINITIS  . CELLULITIS  . Pain in Joint, Multiple Sites  . BACK PAIN, LUMBAR  . Unspecified Myalgia and Myositis  . SKIN RASH  . HEADACHE  . COUGH  . NAUSEA  . DIARRHEA  . STRESS INCONTINENCE  . BARIATRIC SURGERY STATUS  . ABDOMINAL PAIN, ACUTE    Allergies  Allergen Reactions  . Codeine Phosphate     REACTION: unspecified  . Fentanyl     REACTION: whelps  . Morphine     REACTION: severe vomiting  . Morphine Sulfate     REACTION: unspecified  . Nitrofurantoin     REACTION: whelps  . Oxycodone-Acetaminophen     REACTION: severe itching  . Penicillins     REACTION: unspecified  . Sulfamethoxazole W/Trimethoprim     REACTION: rash  . Trimethoprim     REACTION: unspecified    Past Surgical History  Procedure Date  . Cesarean section   . Cervical laminectomy   . Cholecystectomy   . Gyn surgery   . Tubal ligation   . Tonsillectomy   . Foot surgery   . Right knee   . Nasal sinus surgery   . Laparoscopic gastric banding   . Abdominal hysterectomy    Amy Freud, DO, DO No diagnosis found.  [name] comes in today for a fill. We had an upper GI that shows some reflux in her band seems to be open. Ever since an upper respiratory tract infection in September with coughing her band port has been tender and she intermittently has made a midepigastric pain. Her port site does not look red or infected but it is sore. I accessed it today and added 0.3 cc to the port.    [name] read her recent UGI and I  reviewed it.  No evidence of erosion. : Prominent gastroesophageal reflux with esophageal motility  disorder. Otherwise normal exam. Gastric band has been relaxed and  a 13 mm tablet passes without delay.  I will set her up for [name] to perform an endoscopy to rule out erosion.  I don't have a good explanation for her pain.    [name], MD, [facility] [phone] beeper [phone]  06/18/2011 9:11 AM

## 2011-06-29 NOTE — Interval H&P Note (Signed)
History and Physical Interval Note:  06/29/2011 8:00 AM  [name]  has presented today for surgery, with the diagnosis of rule out erosion  The various methods of treatment have been discussed with the patient and family.   An UGI on 06/14/2011 was negative.  After consideration of risks, benefits and other options for treatment, the patient has consented to  Procedure(s): ESOPHAGOGASTRODUODENOSCOPY (EGD) as a surgical intervention .    The patients' history has been reviewed, patient examined, no change in status, stable for surgery.  I have reviewed the patients' chart and labs.  Questions were answered to the patient's satisfaction.     Arvada Seaborn H

## 2011-06-29 NOTE — Brief Op Note (Signed)
06/29/2011  8:36 AM  PATIENT:  Delorse Limber, 55 y.o., female, MRN: [id number]  PREOP DIAGNOSIS:  rule out erosion  POSTOP DIAGNOSIS:   Normal lap band, minimal erosion at EG junction  PROCEDURE:   Procedure(s): ESOPHAGOGASTRODUODENOSCOPY (EGD)  SURGEON:   Ovidio Kin, M.D.  ASSISTANT:   none  ANESTHESIA:   IV sedation  * No anesthesia staff entered *  Moderate Sedation  EBL:  none  ml  BLOOD ADMINISTERED: none  DRAINS: none   LOCAL MEDICATIONS USED:   none  SPECIMEN:   none  COUNTS CORRECT:  YES  INDICATIONS FOR PROCEDURE:  SULY VUKELICH is a 55 y.o. (DOB: 29-Jan-1956) white female whose primary care physician is [name], DO, DO and comes for evaluation of lap band and upper gi tract.   The indications and risks of the surgery were explained to the patient.  The risks include, but are not limited to, infection, bleeding, and nerve injury.  Note dictated to:   #[id number]  DN  06/29/2011.

## 2011-06-29 NOTE — Op Note (Signed)
NAMEHYACINTH, [name]              ACCOUNT NO.:  [id number]  MEDICAL RECORD NO.:  [id number]  LOCATION:  [facility]                         FACILITY:  [facility]  PHYSICIAN:  Sandria Bales. Ezzard Standing, M.D.  DATE OF BIRTH:  [birth year]  DATE OF PROCEDURE:  06/29/2011                               OPERATIVE REPORT  PREOPERATIVE DIAGNOSIS:  History of lap band, Epigastric pain, rule out erosion.  POSTOPERATIVE DIAGNOSIS:  Normal lap band placement with minimal erosion at esophagogastric junction.  PROCEDURE:  Esophagogastroduodenoscopy.  SURGEON:  Sandria Bales. Ezzard Standing, M.D.  FIRST ASSISTANT:  None.  ANESTHESIA:  IV sedation with 50 mg of Demerol and 4 mg of Versed.  COMPLICATIONS:  None.  INDICATION OF PROCEDURE:  [name] is a 55 year old white female, who sees Dr. [name] as her primary medical doctor, had a AP large band placed by Dr. Luretha Murphy on December 30, 2009.  She did well until September 2012, when she developed an upper respiratory tract infection, was placed briefly on steroids and since that time has had some vague epigastric pain, pain around her port, and some night regurgitation.  An upper GI done by Dr. Francene Boyers on June 14, 2011, showed gastroesophageal reflux with esophageal motility disorder, but the band appeared to be in good location and nothing else different of the band.  She sees Dr. [name] who gave her a band holiday, then started to put some fluid back in her band and he asked me to see her to endoscope to make sure if she is having an ulcer or erosion around her band.  Indication and potential complication of the procedure were explained to the patient.  Potential risks include bleeding, perforation.  PROCEDURE NOTE:  The patient is at endoscopy suite at [facility], had an IV in her right hand.  A time-out was held.  She was monitored with a pulse oximetry, blood pressure cuff and EKG and given 2 L nasal O2 during the procedure.  The back of her  throat was sprayed with Cetacaine.  She was given 50 mg of Demerol and 4 mg of Versed.    A flexible Pentax endoscope was passed down the back of her throat without difficulty.  I passed this into the stomach, passed the pylorus into the duodenum.  First and second portion of the duodenums were normal.  The pylorus was normal.  The antrum and stomach itself was normal with no evidence of either any gastritis or changes in her stomach.  I then retroflexed the scope and looked at the impression of the band.  The band impression appeared appropriate.  There was no evidence of erosion or ulcer.  The scope was withdrawn to the band orifice, which was about 43 cm.  This also appeared normal.  The scope was then withdrawn into the esophagogastric junction, which was at about 40 cm.  She may have had some very minimal erosions at her Z-line, but these were fairly minimal.  Her esophagus otherwise was unremarkable.  So this was felt to be a normal exam except for maybe some minimal irritation at her esophagogastric junction.  There was no evidence of ulcer or erosion and the band  location appeared appropriate.  I gave her copies of her pictures and talked to her and her husband.   Sandria Bales. Ezzard Standing, M.D., FACS   DHN/MEDQ  D:  06/29/2011  T:  06/29/2011  Job:  [id number]  cc:   [place] [name], MD [address] [place] [address]  Lelon Perla, DO [address] [place], [place] [address]

## 2011-06-30 ENCOUNTER — Encounter (HOSPITAL_COMMUNITY): Payer: Self-pay

## 2011-06-30 ENCOUNTER — Encounter (HOSPITAL_COMMUNITY): Payer: Self-pay | Admitting: Surgery

## 2011-07-02 ENCOUNTER — Telehealth (INDEPENDENT_AMBULATORY_CARE_PROVIDER_SITE_OTHER): Payer: Self-pay | Admitting: Surgery

## 2011-07-14 ENCOUNTER — Other Ambulatory Visit: Payer: Self-pay | Admitting: Family Medicine

## 2011-07-14 MED ORDER — LIRAGLUTIDE 18 MG/3ML ~~LOC~~ SOLN: 1.2000 mg | Freq: Every day | SUBCUTANEOUS | Status: DC

## 2011-07-14 NOTE — Telephone Encounter (Signed)
Faxed.   KP 

## 2011-07-23 ENCOUNTER — Encounter (INDEPENDENT_AMBULATORY_CARE_PROVIDER_SITE_OTHER): Payer: Self-pay | Admitting: Surgery

## 2011-07-28 ENCOUNTER — Encounter (INDEPENDENT_AMBULATORY_CARE_PROVIDER_SITE_OTHER): Payer: Self-pay | Admitting: Surgery

## 2011-07-28 ENCOUNTER — Ambulatory Visit (INDEPENDENT_AMBULATORY_CARE_PROVIDER_SITE_OTHER): Payer: Commercial Managed Care - PPO | Admitting: Surgery

## 2011-07-28 VITALS — BP 128/70 | HR 72 | Temp 97.9°F | Resp 18 | Ht 71.0 in | Wt 217.2 lb

## 2011-07-28 DIAGNOSIS — Z9884 Bariatric surgery status: Secondary | ICD-10-CM

## 2011-07-28 DIAGNOSIS — Z4651 Encounter for fitting and adjustment of gastric lap band: Secondary | ICD-10-CM

## 2011-07-28 NOTE — Patient Instructions (Addendum)

## 2011-07-28 NOTE — Progress Notes (Signed)
[name] has been having some issues with reflux. Upper GI showed her band was wide open. I don't think she is to tied. I went ahead and added half a cc to her port. Her port has been sore ever since she was on steroids. It is not read or has any headache and does not have any evidence of infection  We'll see how she does with this port fill.  Return 6 weeks

## 2011-08-20 ENCOUNTER — Encounter: Payer: Self-pay | Admitting: Family Medicine

## 2011-08-20 ENCOUNTER — Ambulatory Visit (INDEPENDENT_AMBULATORY_CARE_PROVIDER_SITE_OTHER): Payer: 59 | Admitting: Family Medicine

## 2011-08-20 DIAGNOSIS — F3289 Other specified depressive episodes: Secondary | ICD-10-CM

## 2011-08-20 DIAGNOSIS — E785 Hyperlipidemia, unspecified: Secondary | ICD-10-CM

## 2011-08-20 DIAGNOSIS — F329 Major depressive disorder, single episode, unspecified: Secondary | ICD-10-CM

## 2011-08-20 DIAGNOSIS — F32A Depression, unspecified: Secondary | ICD-10-CM

## 2011-08-20 DIAGNOSIS — M549 Dorsalgia, unspecified: Secondary | ICD-10-CM

## 2011-08-20 DIAGNOSIS — I10 Essential (primary) hypertension: Secondary | ICD-10-CM

## 2011-08-20 DIAGNOSIS — E119 Type 2 diabetes mellitus without complications: Secondary | ICD-10-CM

## 2011-08-20 DIAGNOSIS — K219 Gastro-esophageal reflux disease without esophagitis: Secondary | ICD-10-CM

## 2011-08-20 DIAGNOSIS — B351 Tinea unguium: Secondary | ICD-10-CM

## 2011-08-20 LAB — POCT URINALYSIS DIPSTICK
Blood, UA: NEGATIVE
Glucose, UA: NEGATIVE
Ketones, UA: NEGATIVE
Leukocytes, UA: NEGATIVE
Nitrite, UA: NEGATIVE
Protein, UA: NEGATIVE
Spec Grav, UA: 1.01
Urobilinogen, UA: 0.2
pH, UA: 6

## 2011-08-20 LAB — CBC WITH DIFFERENTIAL/PLATELET
Basophils Absolute: 0.1 10*3/uL (ref 0.0–0.1)
Basophils Relative: 0.8 % (ref 0.0–3.0)
Eosinophils Absolute: 0.2 10*3/uL (ref 0.0–0.7)
Eosinophils Relative: 2.4 % (ref 0.0–5.0)
HCT: 41.4 % (ref 36.0–46.0)
Hemoglobin: 13.8 g/dL (ref 12.0–15.0)
Lymphocytes Relative: 24 % (ref 12.0–46.0)
Lymphs Abs: 2.2 10*3/uL (ref 0.7–4.0)
MCHC: 33.3 g/dL (ref 30.0–36.0)
MCV: 86.8 fl (ref 78.0–100.0)
Monocytes Absolute: 0.8 10*3/uL (ref 0.1–1.0)
Monocytes Relative: 8.4 % (ref 3.0–12.0)
Neutro Abs: 5.9 10*3/uL (ref 1.4–7.7)
Neutrophils Relative %: 64.4 % (ref 43.0–77.0)
Platelets: 291 10*3/uL (ref 150.0–400.0)
RBC: 4.77 Mil/uL (ref 3.87–5.11)
RDW: 13.8 % (ref 11.5–14.6)
WBC: 9.1 10*3/uL (ref 4.5–10.5)

## 2011-08-20 LAB — MICROALBUMIN / CREATININE URINE RATIO
Creatinine,U: 172 mg/dL
Microalb Creat Ratio: 0.3 mg/g (ref 0.0–30.0)
Microalb, Ur: 0.5 mg/dL (ref 0.0–1.9)

## 2011-08-20 LAB — LIPID PANEL
Cholesterol: 119 mg/dL (ref 0–200)
HDL: 54 mg/dL (ref >39.00)
LDL Cholesterol: 52 mg/dL (ref 0–99)
Total CHOL/HDL Ratio: 2
Triglycerides: 67 mg/dL (ref 0.0–149.0)
VLDL: 13.4 mg/dL (ref 0.0–40.0)

## 2011-08-20 LAB — HEPATIC FUNCTION PANEL
ALT: 20 U/L (ref 0–35)
AST: 18 U/L (ref 0–37)
Albumin: 4.3 g/dL (ref 3.5–5.2)
Alkaline Phosphatase: 69 U/L (ref 39–117)
Bilirubin, Direct: 0 mg/dL (ref 0.0–0.3)
Total Bilirubin: 0.5 mg/dL (ref 0.3–1.2)
Total Protein: 7.8 g/dL (ref 6.0–8.3)

## 2011-08-20 LAB — BASIC METABOLIC PANEL
BUN: 16 mg/dL (ref 6–23)
CO2: 32 mEq/L (ref 19–32)
Calcium: 9.2 mg/dL (ref 8.4–10.5)
Chloride: 102 mEq/L (ref 96–112)
Creatinine, Ser: 1 mg/dL (ref 0.4–1.2)
GFR: 64.22 mL/min (ref >60.00)
Glucose, Bld: 107 mg/dL — ABNORMAL HIGH (ref 70–99)
Potassium: 4.8 mEq/L (ref 3.5–5.1)
Sodium: 141 mEq/L (ref 135–145)

## 2011-08-20 LAB — HEMOGLOBIN A1C: Hgb A1c MFr Bld: 6 % (ref 4.6–6.5)

## 2011-08-20 LAB — H. PYLORI ANTIBODY, IGG: H Pylori IgG: NEGATIVE

## 2011-08-20 MED ORDER — LIRAGLUTIDE 18 MG/3ML ~~LOC~~ SOLN: 1.2000 mg | Freq: Every day | SUBCUTANEOUS | Status: DC

## 2011-08-20 MED ORDER — ROSUVASTATIN CALCIUM 20 MG PO TABS: 20.0000 mg | ORAL_TABLET | Freq: Every day | ORAL | Status: DC

## 2011-08-20 MED ORDER — CITALOPRAM HYDROBROMIDE 20 MG PO TABS: 20.0000 mg | ORAL_TABLET | Freq: Every day | ORAL | Status: DC

## 2011-08-20 MED ORDER — OLMESARTAN 10 MG HALF TABLET: 10.0000 mg | ORAL_TABLET | Freq: Every day | ORAL | Status: DC

## 2011-08-20 MED ORDER — CYCLOBENZAPRINE HCL 10 MG PO TABS: 10.0000 mg | ORAL_TABLET | Freq: Three times a day (TID) | ORAL | Status: DC | PRN

## 2011-08-20 MED ORDER — FUROSEMIDE 20 MG PO TABS: ORAL_TABLET | ORAL | Status: DC

## 2011-08-20 MED ORDER — CICLOPIROX 8 % EX SOLN: Freq: Every day | CUTANEOUS | Status: AC

## 2011-08-20 NOTE — Patient Instructions (Signed)
Diet for GERD or PUD Nutrition therapy can help ease the discomfort of gastroesophageal reflux disease (GERD) and peptic ulcer disease (PUD).  HOME CARE INSTRUCTIONS   Eat your meals slowly, in a relaxed setting.   Eat 5 to 6 small meals per day.   If a food causes distress, stop eating it for a period of time.  FOODS TO AVOID  Coffee, regular or decaffeinated.   Cola beverages, regular or low calorie.   Tea, regular or decaffeinated.   Pepper.   Cocoa.   High fat foods, including meats.   Butter, margarine, hydrogenated oil (trans fats).   Peppermint or spearmint (if you have GERD).   Fruits and vegetables if not tolerated.   Alcohol.   Nicotine (smoking or chewing). This is one of the most potent stimulants to acid production in the gastrointestinal tract.   Any food that seems to aggravate your condition.  If you have questions regarding your diet, ask your caregiver or a registered dietitian. TIPS  Lying flat may make symptoms worse. Keep the head of your bed raised 6 to 9 inches (15 to 23 cm) by using a foam wedge or blocks under the legs of the bed.   Do not lay down until 3 hours after eating a meal.   Daily physical activity may help reduce symptoms.  MAKE SURE YOU:   Understand these instructions.   Will watch your condition.   Will get help right away if you are not doing well or get worse.  Document Released: 06/28/2005 Document Revised: 03/10/2011 Document Reviewed: 11/11/2008 [employer] Patient Information 2012 [employer]. 

## 2011-08-20 NOTE — Assessment & Plan Note (Signed)
con't prevacid F/u GI

## 2011-08-20 NOTE — Progress Notes (Signed)
  Subjective:    Patient ID: [name], female    DOB: 1955/12/20, 56 y.o.   MRN: [id number]  HPI Pt her for 6 month f/u -- pt having a lot gerd symptoms.   Pt has been to bariatric surgeon several times ---on prevacid with little relief. HYPERTENSION Disease Monitoring Blood pressure range-doing well Chest pain- no      Dyspnea- no Medications Compliance- good Lightheadedness- no   Edema- no   DIABETES Disease Monitoring Blood Sugar ranges-60-110 Polyuria- no New Visual problems- no Medications Compliance- good Hypoglycemic symptoms- no   HYPERLIPIDEMIA Disease Monitoring See symptoms for Hypertension Medications Compliance- good RUQ pain- no  Muscle aches- no  ROS See HPI above   PMH Smoking Status noted     Review of Systems As above    Objective:   Physical Exam  Constitutional: She is oriented to person, place, and time. She appears well-developed and well-nourished. No distress.  Neck: Normal range of motion. Neck supple.  Cardiovascular: Normal rate, regular rhythm and normal heart sounds.   No murmur heard. Pulmonary/Chest: Effort normal and breath sounds normal. No respiratory distress. She has no wheezes. She has no rales. She exhibits no tenderness.  Abdominal: There is tenderness.       Pain over port incision  Lymphadenopathy:    She has no cervical adenopathy.  Neurological: She is alert and oriented to person, place, and time.  Skin: Skin is warm and dry.  Psychiatric: She has a normal mood and affect. Her behavior is normal. Judgment and thought content normal.    Sensory exam of the foot is normal, tested with the monofilament. Good pulses, no lesions or ulcers, good peripheral pulses.      Assessment & Plan:

## 2011-08-20 NOTE — Assessment & Plan Note (Signed)
Stable con't meds 

## 2011-08-20 NOTE — Assessment & Plan Note (Signed)
con't meds  Check labs 

## 2011-08-20 NOTE — Assessment & Plan Note (Signed)
Check labs con't meds 

## 2011-08-31 ENCOUNTER — Emergency Department
Admission: EM | Admit: 2011-08-31 | Discharge: 2011-08-31 | Disposition: A | Discharge status: Home Health | Payer: 59 | Source: Home / Self Care

## 2011-08-31 ENCOUNTER — Encounter: Payer: Self-pay | Admitting: *Deleted

## 2011-08-31 DIAGNOSIS — J029 Acute pharyngitis, unspecified: Secondary | ICD-10-CM

## 2011-08-31 DIAGNOSIS — J069 Acute upper respiratory infection, unspecified: Secondary | ICD-10-CM

## 2011-08-31 HISTORY — DX: Hyperlipidemia, unspecified: E78.5

## 2011-08-31 HISTORY — DX: Essential (primary) hypertension: I10

## 2011-08-31 LAB — POCT RAPID STREP A (OFFICE): Rapid Strep A Screen: NEGATIVE

## 2011-08-31 MED ORDER — PREDNISONE (PAK) 10 MG PO TABS: 10.0000 mg | ORAL_TABLET | Freq: Every day | ORAL | Status: AC

## 2011-08-31 MED ORDER — AMOXICILLIN 875 MG PO TABS: 875.0000 mg | ORAL_TABLET | Freq: Two times a day (BID) | ORAL | Status: AC

## 2011-08-31 NOTE — ED Provider Notes (Signed)
History     CSN: [id number]  Arrival date & time 08/31/11  0813   None     Chief Complaint  Patient presents with  . Sore Throat    (Consider location/radiation/quality/duration/timing/severity/associated sxs/prior treatment) HPI [name] is a 56 y.o. female who complains of onset of cold symptoms for 1-2days.   Worst symptom constant sore throat.  She had a case of laryngospasm a few months ago and is very concerned because she does not want to develop that again.  She states that she is allergic to penicillin but has taken amoxicillin in the past and it tends to work for her. +   sore throat No cough No pleuritic pain No wheezing + nasal congestion + post-nasal drainage + sinus pain/pressure No chest congestion No itchy/red eyes No earache No hemoptysis No SOB. No difficulty breathing throat spasms. No chills/sweats No fever No nausea No vomiting No abdominal pain No diarrhea No skin rashes No fatigue No myalgias No headache    Past Medical History  Diagnosis Date  . Allergic rhinitis   . Diabetes mellitus type II   . Headache   . Menopause   . GERD (gastroesophageal reflux disease)   . IBS (irritable bowel syndrome)   . Anxiety   . Depression   . Polyarthralgia   . Nasal congestion   . Cough   . Fever and chills   . Breathing difficult   . Glands swollen   . Hypertension   . Hyperlipemia     Past Surgical History  Procedure Date  . Cesarean section   . Cervical laminectomy   . Cholecystectomy   . Gyn surgery   . Tubal ligation   . Tonsillectomy   . Foot surgery   . Right knee   . Nasal sinus surgery   . Laparoscopic gastric banding   . Abdominal hysterectomy   . Esophagogastroduodenoscopy 06/29/2011    Procedure: ESOPHAGOGASTRODUODENOSCOPY (EGD);  Surgeon: Kandis Cocking, MD;  Location: [facility];  Service: General;  Laterality: N/A;    Family History  Problem Relation Age of Onset  . Diabetes Mother   . Stroke Mother   . Breast  cancer Mother   . Atrial fibrillation Mother   . Hypertension Mother   . Cancer Mother     breast  . Diabetes Father   . Stroke Father   . Hypertension Father   . Heart attack Father   . Thyroid disease    . Heart disease      History  Substance Use Topics  . Smoking status: Never Smoker   . Smokeless tobacco: Never Used  . Alcohol Use: No    OB History    Grav Para Term Preterm Abortions TAB SAB Ect Mult Living                  Review of Systems  All other systems reviewed and are negative.    Allergies  Isoniazid; Hctz; Promethazine hcl; Bactrim; Codeine phosphate; Fentanyl; Macrolides and ketolides; Morphine; Morphine sulfate; Nitrofurantoin; Penicillins; Percocet; Phenergan; Sulfamethoxazole w/trimethoprim; and Trimethoprim  Home Medications   Current Outpatient Rx  Name Route Sig Dispense Refill  . ALBUTEROL SULFATE HFA 108 (90 BASE) MCG/ACT IN AERS Inhalation Inhale 1-2 puffs into the lungs every 6 (six) hours as needed for wheezing. 1 Inhaler 0  . AMOXICILLIN 875 MG PO TABS Oral Take 1 tablet (875 mg total) by mouth 2 (two) times daily. 16 tablet 0  . CICLOPIROX 8 % EX SOLN  Topical Apply topically at bedtime. Apply over nail and surrounding skin. Apply daily over previous coat. After seven (7) days, may remove with alcohol and continue cycle. 6.6 mL 0  . CITALOPRAM HYDROBROMIDE 20 MG PO TABS Oral Take 1 tablet (20 mg total) by mouth daily. 90 tablet 3  . CYANOCOBALAMIN 1000 MCG/ML IJ SOLN Intramuscular Inject 1 mL (1,000 mcg total) into the muscle once a week. 10 mL 0    For 4 weeks then monthly, then recheck level  . CYCLOBENZAPRINE HCL 10 MG PO TABS Oral Take 1 tablet (10 mg total) by mouth 3 (three) times daily as needed. 30 tablet 2  . ESTRADIOL 0.1 MG/24HR TD PTTW Transdermal Place 1 patch (0.1 mg total) onto the skin 2 (two) times a week. 8 patch 11  . FUROSEMIDE 20 MG PO TABS  1 po qd prn 90 tablet 3  . GLUCOSE BLOOD VI STRP  Test 3 times per day as  directed     . INSULIN PEN NEEDLE 31G X 5 MM MISC Subcutaneous Inject 1.2 Units into the skin as directed. 100 each 11  . INSULIN PEN NEEDLE 30G X 8 MM MISC Subcutaneous Inject 1 packet into the skin 3 (three) times daily.      Marland Kitchen KLOR-CON M10 10 MEQ PO TBCR  TAKE 1 TABLET BY MOUTH DAILY 90 tablet 3  . LANCET DEVICE MISC  "avia" check three times a day      . PREVACID 24HR PO Oral Take by mouth daily.    Marland Kitchen LEVAQUIN PO Oral Take by mouth.      Marland Kitchen LIRAGLUTIDE 18 MG/3ML [place] SOLN Subcutaneous Inject 0.2 mLs (1.2 mg total) into the skin daily. 6 mL 1  . LORATADINE 10 MG PO TABS Oral Take 10 mg by mouth daily.      Marland Kitchen OLMESARTAN 10 MG HALF TABLET Oral Take 0.5 tablets (10 mg total) by mouth daily. 45 tablet 3  . PREDNISONE (PAK) 10 MG PO TABS Oral Take 1 tablet (10 mg total) by mouth daily. 6 day pack, use as directed, Disp 1 pack 1 tablet 0  . ROSUVASTATIN CALCIUM 20 MG PO TABS Oral Take 1 tablet (20 mg total) by mouth daily. 90 tablet 3  . SYRINGE/NEEDLE (DISP) 25G X 5/8" 3 ML MISC Injection Inject 1 mL as directed once a week. 5 each 1    BP 143/87  Pulse 71  Temp(Src) 98.6 F (37 C) (Oral)  Resp 18  Ht 5\' 11"  (1.803 m)  Wt 216 lb 8 oz (98.204 kg)  BMI 30.20 kg/m2  SpO2 96%  Physical Exam  Nursing note and vitals reviewed. Constitutional: She is oriented to person, place, and time. She appears well-developed and well-nourished.  HENT:  Head: Normocephalic and atraumatic.  Right Ear: Tympanic membrane, external ear and ear canal normal.  Left Ear: Tympanic membrane, external ear and ear canal normal.  Nose: Mucosal edema and rhinorrhea present.  Mouth/Throat: Posterior oropharyngeal erythema present. No oropharyngeal exudate or posterior oropharyngeal edema.  Eyes: No scleral icterus.  Neck: Neck supple.  Cardiovascular: Regular rhythm and normal heart sounds.   Pulmonary/Chest: Effort normal and breath sounds normal. No respiratory distress.  Neurological: She is alert and oriented to  person, place, and time.  Skin: Skin is warm and dry.  Psychiatric: She has a normal mood and affect. Her speech is normal.    ED Course  Procedures (including critical care time)  Labs Reviewed - No data to display No results  found.   1. Acute pharyngitis   2. Acute upper respiratory infections of unspecified site       MDM  1)  Take the prescribed antibiotic as instructed.  Prednisone pack given but told the held for a few days. 2)  Use nasal saline solution (over the counter) at least 3 times a day. 3)  Use over the counter decongestants like Zyrtec-D every 12 hours as needed to help with congestion.  If you have hypertension, do not take medicines with sudafed.  4)  Can take tylenol every 6 hours or motrin every 8 hours for pain or fever. 5)  Follow up with your primary doctor if no improvement in 5-7 days, sooner if increasing pain, fever, or new symptoms.      Lily Kocher, MD 08/31/11 [phone]

## 2011-08-31 NOTE — ED Notes (Signed)
Pt c/o sore throat and bilateral ear ache x 1 day. She has taken mucinex and IBF.

## 2011-09-07 ENCOUNTER — Other Ambulatory Visit: Payer: Self-pay

## 2011-09-07 DIAGNOSIS — E119 Type 2 diabetes mellitus without complications: Secondary | ICD-10-CM

## 2011-09-07 MED ORDER — INSULIN PEN NEEDLE 31G X 5 MM MISC: 1.2000 [IU] | Status: DC

## 2011-09-07 MED ORDER — GLUCOSE BLOOD VI STRP: ORAL_STRIP | Status: DC

## 2011-09-08 ENCOUNTER — Telehealth: Payer: Self-pay | Admitting: Emergency Medicine

## 2011-09-13 ENCOUNTER — Telehealth: Payer: Self-pay | Admitting: Family Medicine

## 2011-09-13 NOTE — Telephone Encounter (Signed)
[facility] wants to know if test strips approved do they send lancets if patient needs? I copied the script below from the chart, please review & advise  Thanks  [name]  Description:  56 year old female  09/07/2011 1:39 PM Refill Provider:  Almeta Monas, CMA  MRN: [id number] Department:  [facility]               Created by     Almeta Monas, CMA on 09/07/2011 1:39 PM         Diagnoses     DM (diabetes mellitus) - Primary  250.00           Approved        Disp  Refills  Start  End    glucose blood (ACCU-CHEK AVIVA) test strip  100 each  3  09/07/2011      Sig: Test 3 times per day as directed    Class: Normal    Authorizing Provider: Lelon Perla, DO    Ordering User: Almeta Monas, CMA    Insulin Pen Needle (B-D UF III MINI PEN NEEDLES) 31G X 5 MM MISC  100 each  11  09/07/2011      Sig - Route: Inject 1.2 Units into the skin as directed. - Subcutaneous    Class: Normal    Authorizing Provider: Lelon Perla, DO    Ordering User: Almeta Monas, CMA                      Visit Pharmacy     [facility] - [place]  - [phone]

## 2011-09-13 NOTE — Telephone Encounter (Signed)
Note sent back to the pharmacy advising to fill Lancets      KP

## 2011-09-14 ENCOUNTER — Telehealth (INDEPENDENT_AMBULATORY_CARE_PROVIDER_SITE_OTHER): Payer: Self-pay | Admitting: Surgery

## 2011-09-14 NOTE — Telephone Encounter (Signed)
Please call.

## 2011-09-15 ENCOUNTER — Encounter (INDEPENDENT_AMBULATORY_CARE_PROVIDER_SITE_OTHER): Payer: Commercial Managed Care - PPO | Admitting: Surgery

## 2011-09-15 ENCOUNTER — Encounter: Payer: 59 | Attending: Surgery | Admitting: *Deleted

## 2011-09-15 ENCOUNTER — Encounter: Payer: Self-pay | Admitting: *Deleted

## 2011-09-15 DIAGNOSIS — Z09 Encounter for follow-up examination after completed treatment for conditions other than malignant neoplasm: Secondary | ICD-10-CM | POA: Insufficient documentation

## 2011-09-15 DIAGNOSIS — Z713 Dietary counseling and surveillance: Secondary | ICD-10-CM | POA: Insufficient documentation

## 2011-09-15 DIAGNOSIS — Z9884 Bariatric surgery status: Secondary | ICD-10-CM | POA: Insufficient documentation

## 2011-09-15 NOTE — Patient Instructions (Signed)
Goals:  Follow Phase 3B: High Protein + Non-Starchy Vegetables  Small, frequent meals (pureed or chopped foods - 5-8 small meals/day)  Protein Supplements  Increase lean protein foods to meet 70-80g goal  Increase fluid intake to 64oz +  Avoid drinking 15 minutes before, during and 30 minutes after eating  Aim for >30 min of physical activity daily  Follow up with [facility] when surgery revision scheduled to schedule post-op diet.

## 2011-09-15 NOTE — Progress Notes (Addendum)
Follow-up visit: 20 Months Post-Operative LAGB Surgery  Medical Nutrition Therapy:  Appt start time: 0730 end time:  0830.  Assessment:  Primary concerns today: post-operative bariatric surgery nutrition management.  [name] returns for f/u. Reports she is having a revision of her LAGB in the next 2-3 weeks. She states Dr. [name] thinks port is not functioning correctly.  Pt reports continued pain in port when doing "anything" and continues to have N/V and reflux.  Under extreme stress at this time with family issues and port pain.   Weight today: 217.6 lbs Weight change: 4.5 lb gain Total weight lost: 45.0 lbs BMI: 30.3% Weight goal: 180 lbs Start weight at [facility]: 262.6 lbs  TANITA  BODY COMP RESULTS  09/15/11     %Fat 46.4%     FM (lbs) 100.5     FFM (lbs) 116.0     TBW (lbs) 85.0      24-hr recall: Pt reports she is unable to keep much down d/t reflux and pain. Consumes 3-4 protein shakes daily and has refried beans, chili when able.   Fluid intake: water, protein supplement = ~60 oz Estimated total protein intake: 60-70g  Medications: No changes Supplementation: Taking regularly  Using straws: No Drinking while eating: No Hair loss: No Carbonated beverages: No N/V/D/C: Reflux, regurgitation, vomiting continues Last Lap-Band fill: 07/2011  Recent physical activity:  Walking 30-45 minutes (3-5 times/week) despite very low energy levels  Progress Towards Goal(s):  In progress.   Nutritional Diagnosis:  [place]-1.4 Altered GI function As related to nocturnal reflux potentially associated with LAGB.  As evidenced by with regurgitation, hiccups, nausea, and pain before, after, and between eating.    Intervention:  Nutrition education.  Monitoring/Evaluation:  Dietary intake, exercise, lap band fills, and body weight. Follow up 2 weeks post-op for diet advancement.  Samples Dispensed:  [employer] MVI: 4 ea Lot #: [id number]; Exp: 12/2012

## 2011-09-17 ENCOUNTER — Telehealth (INDEPENDENT_AMBULATORY_CARE_PROVIDER_SITE_OTHER): Payer: Self-pay | Admitting: Surgery

## 2011-09-17 NOTE — Telephone Encounter (Addendum)
Pt having nausea ok to call out phenergan 25mg  to pharmacy per Dr [name], pt states she is not allergic to phenergan as listed in her chart.  Called phenergan 25mg  disp 30 sig 1 q 4-6h prn for nausea

## 2011-09-22 ENCOUNTER — Encounter (HOSPITAL_COMMUNITY)
Admission: RE | Admit: 2011-09-22 | Discharge: 2011-09-22 | Disposition: A | Discharge status: Home / Self Care | Payer: 59 | Source: Ambulatory Visit | Attending: Surgery | Admitting: Surgery

## 2011-09-22 ENCOUNTER — Ambulatory Visit (HOSPITAL_COMMUNITY)
Admission: RE | Admit: 2011-09-22 | Discharge: 2011-09-22 | Disposition: A | Discharge status: Home / Self Care | Payer: 59 | Source: Ambulatory Visit | Attending: Surgery | Admitting: Surgery

## 2011-09-22 ENCOUNTER — Encounter (HOSPITAL_COMMUNITY): Payer: Self-pay | Admitting: *Deleted

## 2011-09-22 ENCOUNTER — Ambulatory Visit (INDEPENDENT_AMBULATORY_CARE_PROVIDER_SITE_OTHER): Payer: Commercial Managed Care - PPO | Admitting: Surgery

## 2011-09-22 ENCOUNTER — Other Ambulatory Visit: Payer: Self-pay

## 2011-09-22 ENCOUNTER — Encounter (INDEPENDENT_AMBULATORY_CARE_PROVIDER_SITE_OTHER): Payer: Self-pay | Admitting: Surgery

## 2011-09-22 VITALS — BP 119/73 | HR 72 | Temp 97.6°F | Ht 71.0 in | Wt 216.6 lb

## 2011-09-22 DIAGNOSIS — R109 Unspecified abdominal pain: Secondary | ICD-10-CM | POA: Insufficient documentation

## 2011-09-22 DIAGNOSIS — K469 Unspecified abdominal hernia without obstruction or gangrene: Secondary | ICD-10-CM | POA: Insufficient documentation

## 2011-09-22 DIAGNOSIS — R112 Nausea with vomiting, unspecified: Secondary | ICD-10-CM

## 2011-09-22 DIAGNOSIS — Z9884 Bariatric surgery status: Secondary | ICD-10-CM | POA: Insufficient documentation

## 2011-09-22 DIAGNOSIS — Z90710 Acquired absence of both cervix and uterus: Secondary | ICD-10-CM | POA: Insufficient documentation

## 2011-09-22 LAB — BASIC METABOLIC PANEL
BUN: 14 mg/dL (ref 6–23)
CO2: 33 mEq/L — ABNORMAL HIGH (ref 19–32)
Calcium: 9.8 mg/dL (ref 8.4–10.5)
Chloride: 97 mEq/L (ref 96–112)
Creatinine, Ser: 0.87 mg/dL (ref 0.50–1.10)
GFR calc Af Amer: 86 mL/min — ABNORMAL LOW (ref >90)
GFR calc non Af Amer: 74 mL/min — ABNORMAL LOW (ref >90)
Glucose, Bld: 94 mg/dL (ref 70–99)
Potassium: 3.9 mEq/L (ref 3.5–5.1)
Sodium: 138 mEq/L (ref 135–145)

## 2011-09-22 LAB — CBC
HCT: 41.5 % (ref 36.0–46.0)
Hemoglobin: 13.8 g/dL (ref 12.0–15.0)
MCH: 28.2 pg (ref 26.0–34.0)
MCHC: 33.3 g/dL (ref 30.0–36.0)
MCV: 84.9 fL (ref 78.0–100.0)
Platelets: 319 10*3/uL (ref 150–400)
RBC: 4.89 MIL/uL (ref 3.87–5.11)
RDW: 13.2 % (ref 11.5–15.5)
WBC: 10.3 10*3/uL (ref 4.0–10.5)

## 2011-09-22 LAB — SURGICAL PCR SCREEN
MRSA, PCR: NEGATIVE
Staphylococcus aureus: NEGATIVE

## 2011-09-22 MED ORDER — IOHEXOL 300 MG/ML  SOLN
100.0000 mL | Freq: Once | INTRAMUSCULAR | Status: AC | PRN
  Administered 2011-09-22: 100 mL via INTRAVENOUS

## 2011-09-22 NOTE — Patient Instructions (Signed)
YOUR SURGERY IS SCHEDULED ON: Friday  09/24/11  AT 7:30 AM  REPORT TO [facility] SHORT STAY CENTER AT:  5:30 AM       PHONE # FOR SHORT STAY IS [phone]  DO NOT EAT OR DRINK ANYTHING AFTER MIDNIGHT THE NIGHT BEFORE YOUR SURGERY.  YOU MAY BRUSH YOUR TEETH, RINSE OUT YOUR MOUTH--BUT NO WATER, NO FOOD, NO CHEWING GUM, NO MINTS, NO CANDIES, NO CHEWING TOBACCO.  PLEASE TAKE THE FOLLOWING MEDICATIONS THE AM OF YOUR SURGERY WITH A FEW SIPS OF WATER:  CLARITIN AND PREVACID    IF YOU USE INHALERS--USE YOUR INHALERS THE AM OF YOUR SURGERY AND BRING INHALERS TO THE HOSPITAL -TAKE TO SURGERY.    IF YOU ARE DIABETIC:  DO NOT TAKE ANY DIABETIC MEDICATIONS THE AM OF YOUR SURGERY.  IF YOU TAKE INSULIN IN THE EVENINGS--PLEASE ONLY TAKE 1/2 NORMAL EVENING DOSE THE NIGHT BEFORE YOUR SURGERY.  NO INSULIN THE AM OF YOUR SURGERY.  IF YOU HAVE SLEEP APNEA AND USE CPAP OR BIPAP--PLEASE BRING THE MASK --NOT THE MACHINE-NOT THE TUBING   -JUST THE MASK. DO NOT BRING VALUABLES, MONEY, CREDIT CARDS.  CONTACT LENS, DENTURES / PARTIALS, GLASSES SHOULD NOT BE WORN TO SURGERY AND IN MOST CASES-HEARING AIDS WILL NEED TO BE REMOVED.  BRING YOUR GLASSES CASE, ANY EQUIPMENT NEEDED FOR YOUR CONTACT LENS. FOR PATIENTS ADMITTED TO THE HOSPITAL--CHECK OUT TIME THE DAY OF DISCHARGE IS 11:00 AM.  ALL INPATIENT ROOMS ARE PRIVATE - WITH BATHROOM, TELEPHONE, TELEVISION AND WIFI INTERNET. IF YOU ARE BEING DISCHARGED THE SAME DAY OF YOUR SURGERY--YOU CAN NOT DRIVE YOURSELF HOME--AND SHOULD NOT GO HOME ALONE BY TAXI OR BUS.  NO DRIVING OR OPERATING MACHINERY FOR 24 HOURS FOLLOWING ANESTHESIA / PAIN MEDICATIONS.                            SPECIAL INSTRUCTIONS:  CHLORHEXIDINE SOAP SHOWER (other brand names are Betasept and Hibiclens ) PLEASE SHOWER WITH CHLORHEXIDINE THE NIGHT BEFORE YOUR SURGERY AND THE AM OF YOUR SURGERY. DO NOT USE CHLORHEXIDINE ON YOUR FACE OR PRIVATE AREAS--YOU MAY USE YOUR NORMAL SOAP THOSE AREAS AND YOUR NORMAL  SHAMPOO.  WOMEN SHOULD AVOID SHAVING UNDER ARMS AND SHAVING LEGS 48 HOURS BEFORE USING CHLORHEXIDINE TO AVOID SKIN IRRITATION.  DO NOT USE IF ALLERGIC TO CHLORHEXIDINE.  PLEASE READ OVER ANY  FACT SHEETS THAT YOU WERE GIVEN: MRSA INFORMATION, BLOOD TRANSFUSION INFORMATION, INCENTIVE SPIROMETER INFORMATION.

## 2011-09-22 NOTE — Progress Notes (Signed)
Addended by: Latricia Heft on: 09/22/2011 10:53 AM   Modules accepted: Orders

## 2011-09-22 NOTE — Progress Notes (Signed)
Chief Complaint:  Abdominal pain and nausea  History of Present Illness:  [name] is an 56 y.o. female who began having nausea yesterday.  We have been trying to get her on the schedule for laparoscopy and also to examine report because she's been having persistent port pain and reflux symptoms since an upper respiratory tract infection last year. Her weight is 216. She had an APL band and hiatal hernia repair. She has been endoscoped and there is no evidence of erosion. Recent symptoms began yesterday including intractable nausea and some tenderness around the band port make me wonder if she isn't developing a hernia it now could be a Richter's hernia. To delineate this further we will going a CT scan of her abdomen today and will see back try to get her on the schedule for laparoscopy to change out her port if necessary and also to make sure her band is okay on the inside.  Past Medical History  Diagnosis Date  . Allergic rhinitis   . Diabetes mellitus type II   . Headache   . Menopause   . GERD (gastroesophageal reflux disease)   . IBS (irritable bowel syndrome)   . Anxiety   . Depression   . Polyarthralgia   . Nasal congestion   . Cough   . Fever and chills   . Breathing difficult   . Glands swollen   . Hypertension   . Hyperlipemia     Past Surgical History  Procedure Date  . Cesarean section   . Cervical laminectomy   . Cholecystectomy   . Gyn surgery   . Tubal ligation   . Tonsillectomy   . Foot surgery   . Right knee   . Nasal sinus surgery   . Laparoscopic gastric banding   . Abdominal hysterectomy   . Esophagogastroduodenoscopy 06/29/2011    Procedure: ESOPHAGOGASTRODUODENOSCOPY (EGD);  Surgeon: David H Newman, MD;  Location: [facility];  Service: General;  Laterality: N/A;    Current Outpatient Prescriptions  Medication Sig Dispense Refill  . albuterol (PROVENTIL HFA;VENTOLIN HFA) 108 (90 BASE) MCG/ACT inhaler Inhale 1-2 puffs into the lungs every 6  (six) hours as needed for wheezing.  1 Inhaler  0  . ciclopirox (PENLAC) 8 % solution Apply topically at bedtime. Apply over nail and surrounding skin. Apply daily over previous coat. After seven (7) days, may remove with alcohol and continue cycle.  6.6 mL  0  . citalopram (CELEXA) 20 MG tablet Take 1 tablet (20 mg total) by mouth daily.  90 tablet  3  . cyanocobalamin (,VITAMIN B-12,) 1000 MCG/ML injection Inject 1 mL (1,000 mcg total) into the muscle once a week.  10 mL  0  . cyclobenzaprine (FLEXERIL) 10 MG tablet Take 1 tablet (10 mg total) by mouth 3 (three) times daily as needed.  30 tablet  2  . estradiol (VIVELLE) 0.1 MG/24HR Place 1 patch (0.1 mg total) onto the skin 2 (two) times a week.  8 patch  11  . furosemide (LASIX) 20 MG tablet 1 po qd prn  90 tablet  3  . glucose blood (ACCU-CHEK AVIVA) test strip Test 3 times per day as directed  100 each  3  . Insulin Pen Needle (B-D UF III MINI PEN NEEDLES) 31G X 5 MM MISC Inject 1.2 Units into the skin as directed.  100 each  11  . Insulin Pen Needle (NOVOFINE) 30G X 8 MM MISC Inject 1 packet into the skin 3 (three) times   daily.        . KLOR-CON M10 10 MEQ tablet TAKE 1 TABLET BY MOUTH DAILY  90 tablet  3  . Lancet Device MISC "avia" check three times a day        . Lansoprazole (PREVACID 24HR PO) Take by mouth daily.      . Levofloxacin (LEVAQUIN PO) Take by mouth.        . Liraglutide (VICTOZA) 18 MG/3ML SOLN Inject 0.2 mLs (1.2 mg total) into the skin daily.  6 mL  1  . loratadine (CLARITIN) 10 MG tablet Take 10 mg by mouth daily.        . olmesartan (BENICAR) 10 mg TABS Take 0.5 tablets (10 mg total) by mouth daily.  45 tablet  3  . rosuvastatin (CRESTOR) 20 MG tablet Take 1 tablet (20 mg total) by mouth daily.  90 tablet  3  . SYRINGE-NEEDLE, DISP, 3 ML (BD ECLIPSE SYRINGE) 25G X 5/8" 3 ML MISC Inject 1 mL as directed once a week.  5 each  1   Isoniazid; Hctz; Promethazine hcl; Bactrim; Codeine phosphate; Fentanyl; Macrolides and  ketolides; Morphine; Morphine sulfate; Nitrofurantoin; Penicillins; Percocet; Phenergan; Sulfamethoxazole w/trimethoprim; and Trimethoprim Family History  Problem Relation Age of Onset  . Diabetes Mother   . Stroke Mother   . Breast cancer Mother   . Atrial fibrillation Mother   . Hypertension Mother   . Cancer Mother     breast  . Diabetes Father   . Stroke Father   . Hypertension Father   . Heart attack Father   . Thyroid disease    . Heart disease     Social History:   reports that she has never smoked. She has never used smokeless tobacco. She reports that she does not drink alcohol or use illicit drugs.   REVIEW OF SYSTEMS - PERTINENT POSITIVES ONLY: noncontributory  Physical Exam:   Blood pressure 119/73, pulse 72, temperature 97.6 F (36.4 C), temperature source Temporal, height 5' 11" (1.803 m), weight 216 lb 9.6 oz (98.249 kg), SpO2 97.00%. Body mass index is 30.21 kg/(m^2).  Gen:  WDWN whhite femal NAD  Neurological: Alert and oriented to person, place, and time. Motor and sensory function is grossly intact  Head: Normocephalic and atraumatic.  Eyes: Conjunctivae are normal. Pupils are equal, round, and reactive to light. No scleral icterus.  Neck: Normal range of motion. Neck supple. No tracheal deviation or thyromegaly present.  Cardiovascular:  SR without murmurs or gallops.  No carotid bruits Respiratory: Effort normal.  No respiratory distress. No chest wall tenderness. Breath sounds normal.  No wheezes, rales or rhonchi.  Abdomen:  More prominent than ususal port site.  Cannot rule out hernia GU: Musculoskeletal: Normal range of motion. Extremities are nontender. No cyanosis, edema or clubbing noted Lymphadenopathy: No cervical, preauricular, postauricular or axillary adenopathy is present Skin: Skin is warm and dry. No rash noted. No diaphoresis. No erythema. No pallor. Pscyh: Normal mood and affect. Behavior is normal. Judgment and thought content normal.    LABORATORY RESULTS: No results found for this or any previous visit (from the past 48 hour(s)).  RADIOLOGY RESULTS: No results found.  Problem List: Patient Active Problem List  Diagnoses  . ONYCHOMYCOSIS, BILATERAL  . DIABETES MELLITUS, TYPE II  . VITAMIN B12 DEFICIENCY  . UNSPECIFIED VITAMIN D DEFICIENCY  . HYPERLIPIDEMIA NEC/NOS  . MORBID OBESITY  . OTHER SPECIFIED ANEMIAS  . ANXIETY  . DEPRESSION  . HYPERTENSION, ESSENTIAL NOS  .   ACUTE SINUSITIS, UNSPECIFIED  . ACUTE PHARYNGITIS  . BRONCHITIS, ACUTE  . ALLERGIC RHINITIS  . CELLULITIS  . Pain in Joint, Multiple Sites  . BACK PAIN, LUMBAR  . Unspecified Myalgia and Myositis  . SKIN RASH  . HEADACHE  . COUGH  . NAUSEA  . DIARRHEA  . STRESS INCONTINENCE  . BARIATRIC SURGERY STATUS  . ABDOMINAL PAIN, ACUTE  . 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  . GERD (gastroesophageal reflux disease)    Assessment & Plan: Prior abdomen pain with port site pain.  Now with nausea.  Plan CT abdomen and move ahead with laparoscopy and port change    [name] Izzabell Klasen, MD, FACS  [facility] [phone] beeper [phone]  09/22/2011 10:28 AM     

## 2011-09-22 NOTE — Pre-Procedure Instructions (Signed)
CXR REPORT ON CHART FROM 04/09/11 - FROM [facility] [place]-AND REPORT ALSO IN EPIC. EKG, CBC, BMET WILL BE DONE TODAY AT [facility].

## 2011-09-23 ENCOUNTER — Other Ambulatory Visit (INDEPENDENT_AMBULATORY_CARE_PROVIDER_SITE_OTHER): Payer: Self-pay | Admitting: Surgery

## 2011-09-23 ENCOUNTER — Encounter (HOSPITAL_COMMUNITY): Payer: Self-pay | Admitting: Pharmacy Technician

## 2011-09-23 NOTE — Progress Notes (Signed)
Called office for Dr [name] to put orders in [facility] for Pt's 530 arrival tomorrow

## 2011-09-24 ENCOUNTER — Encounter (HOSPITAL_COMMUNITY): Payer: Self-pay | Admitting: Certified Registered Nurse Anesthetist

## 2011-09-24 ENCOUNTER — Encounter (HOSPITAL_COMMUNITY): Payer: Self-pay | Admitting: *Deleted

## 2011-09-24 ENCOUNTER — Ambulatory Visit (HOSPITAL_COMMUNITY): Payer: 59 | Admitting: Certified Registered Nurse Anesthetist

## 2011-09-24 ENCOUNTER — Encounter (HOSPITAL_COMMUNITY)
Admission: RE | Disposition: A | Discharge status: Home / Self Care | Payer: Self-pay | Source: Ambulatory Visit | Attending: Surgery

## 2011-09-24 ENCOUNTER — Ambulatory Visit (HOSPITAL_COMMUNITY)
Admission: RE | Admit: 2011-09-24 | Discharge: 2011-09-24 | Disposition: A | Discharge status: Home / Self Care | Payer: 59 | Source: Ambulatory Visit | Attending: Surgery | Admitting: Surgery

## 2011-09-24 DIAGNOSIS — T85898A Other specified complication of other internal prosthetic devices, implants and grafts, initial encounter: Secondary | ICD-10-CM

## 2011-09-24 DIAGNOSIS — I10 Essential (primary) hypertension: Secondary | ICD-10-CM | POA: Insufficient documentation

## 2011-09-24 DIAGNOSIS — R109 Unspecified abdominal pain: Secondary | ICD-10-CM

## 2011-09-24 DIAGNOSIS — K9509 Other complications of gastric band procedure: Secondary | ICD-10-CM | POA: Insufficient documentation

## 2011-09-24 DIAGNOSIS — Z79899 Other long term (current) drug therapy: Secondary | ICD-10-CM | POA: Insufficient documentation

## 2011-09-24 DIAGNOSIS — Z90710 Acquired absence of both cervix and uterus: Secondary | ICD-10-CM | POA: Insufficient documentation

## 2011-09-24 DIAGNOSIS — K219 Gastro-esophageal reflux disease without esophagitis: Secondary | ICD-10-CM | POA: Insufficient documentation

## 2011-09-24 DIAGNOSIS — E119 Type 2 diabetes mellitus without complications: Secondary | ICD-10-CM | POA: Insufficient documentation

## 2011-09-24 DIAGNOSIS — R110 Nausea: Secondary | ICD-10-CM | POA: Insufficient documentation

## 2011-09-24 DIAGNOSIS — Y831 Surgical operation with implant of artificial internal device as the cause of abnormal reaction of the patient, or of later complication, without mention of misadventure at the time of the procedure: Secondary | ICD-10-CM | POA: Insufficient documentation

## 2011-09-24 DIAGNOSIS — Z794 Long term (current) use of insulin: Secondary | ICD-10-CM | POA: Insufficient documentation

## 2011-09-24 DIAGNOSIS — E785 Hyperlipidemia, unspecified: Secondary | ICD-10-CM | POA: Insufficient documentation

## 2011-09-24 HISTORY — PX: GASTRIC BANDING PORT REVISION: SHX5246

## 2011-09-24 HISTORY — PX: LAPAROSCOPY: SHX197

## 2011-09-24 LAB — PREGNANCY, URINE: Preg Test, Ur: NEGATIVE

## 2011-09-24 LAB — GLUCOSE, CAPILLARY
Glucose-Capillary: 116 mg/dL — ABNORMAL HIGH (ref 70–99)
Glucose-Capillary: 130 mg/dL — ABNORMAL HIGH (ref 70–99)

## 2011-09-24 LAB — COMPREHENSIVE METABOLIC PANEL
ALT: 23 U/L (ref 0–35)
AST: 21 U/L (ref 0–37)
Albumin: 3.9 g/dL (ref 3.5–5.2)
Alkaline Phosphatase: 77 U/L (ref 39–117)
BUN: 11 mg/dL (ref 6–23)
CO2: 34 mEq/L — ABNORMAL HIGH (ref 19–32)
Calcium: 9.5 mg/dL (ref 8.4–10.5)
Chloride: 98 mEq/L (ref 96–112)
Creatinine, Ser: 0.89 mg/dL (ref 0.50–1.10)
GFR calc Af Amer: 84 mL/min — ABNORMAL LOW (ref >90)
GFR calc non Af Amer: 72 mL/min — ABNORMAL LOW (ref >90)
Glucose, Bld: 113 mg/dL — ABNORMAL HIGH (ref 70–99)
Potassium: 3.9 mEq/L (ref 3.5–5.1)
Sodium: 137 mEq/L (ref 135–145)
Total Bilirubin: 0.7 mg/dL (ref 0.3–1.2)
Total Protein: 7.5 g/dL (ref 6.0–8.3)

## 2011-09-24 SURGERY — LAPAROSCOPY, DIAGNOSTIC
Anesthesia: General | Wound class: Clean

## 2011-09-24 MED ORDER — 0.9 % SODIUM CHLORIDE (POUR BTL) OPTIME
TOPICAL | Status: DC | PRN
  Administered 2011-09-24: 1000 mL

## 2011-09-24 MED ORDER — ACETAMINOPHEN 10 MG/ML IV SOLN
INTRAVENOUS | Status: AC
  Filled 2011-09-24: qty 100

## 2011-09-24 MED ORDER — PROPOFOL 10 MG/ML IV EMUL
INTRAVENOUS | Status: DC | PRN
  Administered 2011-09-24: 200 mg via INTRAVENOUS

## 2011-09-24 MED ORDER — BUPIVACAINE LIPOSOME 1.3 % IJ SUSP
INTRAMUSCULAR | Status: DC | PRN
  Administered 2011-09-24: 20 mL

## 2011-09-24 MED ORDER — LACTATED RINGERS IV SOLN
INTRAVENOUS | Status: DC | PRN
  Administered 2011-09-24: 07:00:00 via INTRAVENOUS

## 2011-09-24 MED ORDER — GLYCOPYRROLATE 0.2 MG/ML IJ SOLN
INTRAMUSCULAR | Status: DC | PRN
  Administered 2011-09-24: 0.2 mg via INTRAVENOUS
  Administered 2011-09-24: .7 mg via INTRAVENOUS

## 2011-09-24 MED ORDER — DEXAMETHASONE SODIUM PHOSPHATE 10 MG/ML IJ SOLN
INTRAMUSCULAR | Status: DC | PRN
  Administered 2011-09-24: 10 mg via INTRAVENOUS

## 2011-09-24 MED ORDER — HEPARIN SODIUM (PORCINE) 5000 UNIT/ML IJ SOLN
5000.0000 [IU] | INTRAMUSCULAR | Status: AC
  Administered 2011-09-24: 5000 [IU] via SUBCUTANEOUS

## 2011-09-24 MED ORDER — LIDOCAINE HCL (CARDIAC) 20 MG/ML IV SOLN
INTRAVENOUS | Status: DC | PRN
  Administered 2011-09-24: 100 mg via INTRAVENOUS

## 2011-09-24 MED ORDER — ROCURONIUM BROMIDE 100 MG/10ML IV SOLN
INTRAVENOUS | Status: DC | PRN
  Administered 2011-09-24: 10 mg via INTRAVENOUS
  Administered 2011-09-24: 5 mg via INTRAVENOUS
  Administered 2011-09-24: 35 mg via INTRAVENOUS

## 2011-09-24 MED ORDER — MIDAZOLAM HCL 5 MG/5ML IJ SOLN
INTRAMUSCULAR | Status: DC | PRN
  Administered 2011-09-24: 2 mg via INTRAVENOUS

## 2011-09-24 MED ORDER — BUPIVACAINE-EPINEPHRINE 0.25% -1:200000 IJ SOLN
INTRAMUSCULAR | Status: DC | PRN
  Administered 2011-09-24: 10 mL

## 2011-09-24 MED ORDER — EPHEDRINE SULFATE 50 MG/ML IJ SOLN
INTRAMUSCULAR | Status: DC | PRN
  Administered 2011-09-24 (×2): 10 mg via INTRAVENOUS
  Administered 2011-09-24: 5 mg via INTRAVENOUS

## 2011-09-24 MED ORDER — SUFENTANIL CITRATE 50 MCG/ML IV SOLN
INTRAVENOUS | Status: DC | PRN
  Administered 2011-09-24 (×5): 5 ug via INTRAVENOUS

## 2011-09-24 MED ORDER — HEPARIN SODIUM (PORCINE) 5000 UNIT/ML IJ SOLN
INTRAMUSCULAR | Status: AC
  Filled 2011-09-24: qty 1

## 2011-09-24 MED ORDER — SUCCINYLCHOLINE CHLORIDE 20 MG/ML IJ SOLN
INTRAMUSCULAR | Status: DC | PRN
  Administered 2011-09-24: 100 mg via INTRAVENOUS

## 2011-09-24 MED ORDER — DEXTROSE 5 % IV SOLN
2.0000 g | INTRAVENOUS | Status: AC
  Administered 2011-09-24: 2 g via INTRAVENOUS

## 2011-09-24 MED ORDER — HYDROMORPHONE HCL PF 1 MG/ML IJ SOLN
INTRAMUSCULAR | Status: DC | PRN
  Administered 2011-09-24: 0.5 mg via INTRAVENOUS

## 2011-09-24 MED ORDER — ONDANSETRON HCL 4 MG/2ML IJ SOLN
INTRAMUSCULAR | Status: DC | PRN
  Administered 2011-09-24: 4 mg via INTRAVENOUS

## 2011-09-24 MED ORDER — BUPIVACAINE-EPINEPHRINE PF 0.25-1:200000 % IJ SOLN
INTRAMUSCULAR | Status: AC
  Filled 2011-09-24: qty 30

## 2011-09-24 MED ORDER — HYDROMORPHONE HCL PF 1 MG/ML IJ SOLN: 0.2500 mg | INTRAMUSCULAR | Status: DC | PRN

## 2011-09-24 MED ORDER — SODIUM CHLORIDE 0.9 % IJ SOLN
INTRAMUSCULAR | Status: DC | PRN
  Administered 2011-09-24: 50 mL

## 2011-09-24 MED ORDER — ACETAMINOPHEN 10 MG/ML IV SOLN
INTRAVENOUS | Status: DC | PRN
  Administered 2011-09-24: 1000 mg via INTRAVENOUS

## 2011-09-24 MED ORDER — NEOSTIGMINE METHYLSULFATE 1 MG/ML IJ SOLN
INTRAMUSCULAR | Status: DC | PRN
  Administered 2011-09-24: 5 mg via INTRAVENOUS

## 2011-09-24 MED ORDER — LACTATED RINGERS IV SOLN: INTRAVENOUS | Status: DC

## 2011-09-24 MED ORDER — BUPIVACAINE LIPOSOME 1.3 % IJ SUSP
20.0000 mL | Freq: Once | INTRAMUSCULAR | Status: DC
  Filled 2011-09-24: qty 20

## 2011-09-24 MED ORDER — DROPERIDOL 2.5 MG/ML IJ SOLN
INTRAMUSCULAR | Status: DC | PRN
  Administered 2011-09-24: 0.625 mg via INTRAVENOUS

## 2011-09-24 MED ORDER — CEFOXITIN SODIUM-DEXTROSE 1-4 GM-% IV SOLR (PREMIX)
INTRAVENOUS | Status: AC
  Filled 2011-09-24: qty 100

## 2011-09-24 MED ORDER — HYDROCODONE-ACETAMINOPHEN 7.5-500 MG/15ML PO SOLN: 15.0000 mL | ORAL | Status: AC | PRN

## 2011-09-24 SURGICAL SUPPLY — 75 items
ADH SKN CLS APL DERMABOND .7 (GAUZE/BANDAGES/DRESSINGS) ×2
BENZOIN TINCTURE PRP APPL 2/3 (GAUZE/BANDAGES/DRESSINGS) ×3 IMPLANT
BLADE HEX COATED 2.75 (ELECTRODE) ×3 IMPLANT
BLADE SURG 15 STRL LF DISP TIS (BLADE) ×2 IMPLANT
BLADE SURG 15 STRL SS (BLADE) ×1
CANISTER SUCTION 2500CC (MISCELLANEOUS) ×3 IMPLANT
CLOTH BEACON ORANGE TIMEOUT ST (SAFETY) ×3 IMPLANT
COVER SURGICAL LIGHT HANDLE (MISCELLANEOUS) IMPLANT
DECANTER SPIKE VIAL GLASS SM (MISCELLANEOUS) ×3 IMPLANT
DERMABOND ADVANCED (GAUZE/BANDAGES/DRESSINGS) ×2
DERMABOND ADVANCED .7 DNX12 (GAUZE/BANDAGES/DRESSINGS) ×4 IMPLANT
DEVICE SUT QUICK LOAD TK 5 (STAPLE) IMPLANT
DEVICE SUT TI-KNOT TK 5X26 (MISCELLANEOUS) IMPLANT
DEVICE SUTURE ENDOST 10MM (ENDOMECHANICALS) IMPLANT
DISSECTOR BLUNT TIP ENDO 5MM (MISCELLANEOUS) IMPLANT
DRAPE CAMERA CLOSED 9X96 (DRAPES) ×3 IMPLANT
DRAPE LAPAROSCOPIC ABDOMINAL (DRAPES) IMPLANT
ELECT REM PT RETURN 9FT ADLT (ELECTROSURGICAL) ×3
ELECTRODE REM PT RTRN 9FT ADLT (ELECTROSURGICAL) ×2 IMPLANT
GLOVE BIOGEL M 8.0 STRL (GLOVE) ×3 IMPLANT
GLOVE BIOGEL PI IND STRL 7.0 (GLOVE) ×2 IMPLANT
GLOVE BIOGEL PI INDICATOR 7.0 (GLOVE) ×1
GOWN STRL NON-REIN LRG LVL3 (GOWN DISPOSABLE) ×6 IMPLANT
GOWN STRL REIN XL XLG (GOWN DISPOSABLE) ×6 IMPLANT
HAND ACTIVATED (MISCELLANEOUS) IMPLANT
HOVERMATT SINGLE USE (MISCELLANEOUS) IMPLANT
KIT ACCESS PORT VG (Band) ×3 IMPLANT
KIT BASIN OR (CUSTOM PROCEDURE TRAY) ×3 IMPLANT
MESH HERNIA 1X4 RECT BARD (Mesh General) ×2 IMPLANT
MESH HERNIA BARD 1X4 (Mesh General) ×1 IMPLANT
NEEDLE HYPO 22GX1.5 SAFETY (NEEDLE) ×3 IMPLANT
NEEDLE SPNL 22GX3.5 QUINCKE BK (NEEDLE) ×3 IMPLANT
NS IRRIG 1000ML POUR BTL (IV SOLUTION) ×3 IMPLANT
PACK GENERAL/GYN (CUSTOM PROCEDURE TRAY) IMPLANT
PACK UNIVERSAL I (CUSTOM PROCEDURE TRAY) ×3 IMPLANT
PENCIL BUTTON HOLSTER BLD 10FT (ELECTRODE) ×3 IMPLANT
SCALPEL HARMONIC ACE (MISCELLANEOUS) IMPLANT
SET IRRIG TUBING LAPAROSCOPIC (IRRIGATION / IRRIGATOR) IMPLANT
SHEARS CURVED HARMONIC AC 45CM (MISCELLANEOUS) IMPLANT
SLEEVE ADV FIXATION 5X100MM (TROCAR) ×3 IMPLANT
SLEEVE XCEL OPT CAN 5 100 (ENDOMECHANICALS) ×3 IMPLANT
SLEEVE Z-THREAD 5X100MM (TROCAR) IMPLANT
SOLUTION ANTI FOG 6CC (MISCELLANEOUS) ×3 IMPLANT
SPONGE GAUZE 4X4 12PLY (GAUZE/BANDAGES/DRESSINGS) ×3 IMPLANT
SPONGE LAP 18X18 X RAY DECT (DISPOSABLE) ×3 IMPLANT
STAPLER VISISTAT 35W (STAPLE) IMPLANT
STRIP CLOSURE SKIN 1/2X4 (GAUZE/BANDAGES/DRESSINGS) ×3 IMPLANT
SUT ETHIBOND 2 0 SH (SUTURE)
SUT ETHIBOND 2 0 SH 36X2 (SUTURE) IMPLANT
SUT PROLENE 2 0 CT2 30 (SUTURE) ×3 IMPLANT
SUT SILK 0 (SUTURE)
SUT SILK 0 30XBRD TIE 6 (SUTURE) IMPLANT
SUT SURGIDAC NAB ES-9 0 48 120 (SUTURE) IMPLANT
SUT VIC AB 2-0 SH 18 (SUTURE) ×3 IMPLANT
SUT VIC AB 2-0 SH 27 (SUTURE)
SUT VIC AB 2-0 SH 27X BRD (SUTURE) IMPLANT
SUT VIC AB 4-0 SH 18 (SUTURE) ×3 IMPLANT
SYR 20CC LL (SYRINGE) ×3 IMPLANT
SYR 30ML LL (SYRINGE) ×3 IMPLANT
SYR BULB IRRIGATION 50ML (SYRINGE) IMPLANT
SYRINGE 10CC LL (SYRINGE) ×3 IMPLANT
SYS KII OPTICAL ACCESS 15MM (TROCAR)
SYSTEM KII OPTICAL ACCESS 15MM (TROCAR) IMPLANT
TAPE CLOTH SURG 4X10 WHT LF (GAUZE/BANDAGES/DRESSINGS) ×3 IMPLANT
TOWEL OR 17X26 10 PK STRL BLUE (TOWEL DISPOSABLE) ×6 IMPLANT
TRAY FOLEY CATH 14FRSI W/METER (CATHETERS) IMPLANT
TRAY LAP CHOLE (CUSTOM PROCEDURE TRAY) ×3 IMPLANT
TROCAR ADV FIXATION 11X100MM (TROCAR) IMPLANT
TROCAR HASSON GELL 12X100 (TROCAR) IMPLANT
TROCAR XCEL NON-BLD 11X100MML (ENDOMECHANICALS) IMPLANT
TROCAR Z-THREAD FIOS 11X100 BL (TROCAR) IMPLANT
TROCAR Z-THREAD FIOS 5X100MM (TROCAR) ×3 IMPLANT
TROCAR Z-THREAD SLEEVE 11X100 (TROCAR) IMPLANT
TUBE CALIBRATION LAPBAND (TUBING) ×3 IMPLANT
TUBING INSUFFLATION 10FT LAP (TUBING) ×3 IMPLANT

## 2011-09-24 NOTE — Op Note (Signed)
Surgeon: Wenda Low, MD, FACS  Asst:  Gaynelle Adu M.D. FACS  Anes:  General  Procedure: Diagnostic laparoscopy, exchange of lap band port  Diagnosis: Abdominal pain around port  Complications: None  EBL:   Minimal cc  Description of Procedure:  Patient was taken to room 11 on Friday, 09/24/2011 and given general anesthesia. The abdomen was prepped with PCMX and draped sterilely. A timeout was performed and we then began an Optiview technique in the left upper quadrant her old scar. I got down to the posterior sheath and felt that I was possibly in the preperitoneal space or in the peritoneum and it looked like there may be bowel Tolectin to go above that a medial enter the abdomen and then looked back in and then guide the trocar into the abdomen. There is no bowel in harm's way. 2 more trochars are placed to the left of the umbilicus in the lower midline. These were all 5 mm trochars and they allowed me to first surveyed the band. The lap band was in good position and was not stuck to the liver. Anterior plication sutures were fixed nicely and looked good.  The tubing lay in a gentle angle with loop but nothing was impinged by that was nothing really abnormal about its lap and no evidence of infection on the inside was unable to see any hernias where the tubing came into the abdomen.  The patient was rotated to the left in place the head down position I then identified the terminal ileum and followed the terminal ileum is a gently distended down to the pelvis where it was attached to some chronic adhesions from her previous hysterectomy. These came out of the pelvis is one loop and I followed this back to the ligament of Treitz. This all looked normal.  We relative abdomen inflated and a transverse incision over the port and explanted the old port. There was moderate fibrous reaction and we were to excise this in toto. The new port was then lysed on the tubing just proximal to the previous  juncture thus discard in the old port and inserting a new one with mesh on the back. 2-0 Vicryl was used to suture the mesh back to the fascia called in place while it heals and was placed in the same pocket which is in pretty much a subcutaneous position. The wound was then closed in multiple layers with 4-0 Vicryl next and Exparell was injected.  Exparel was used in the port site and the 2 left upper quadrant ports. 2 left lower ports were injected with quarter percent Marcaine with epinephrine. Wounds were closed with 4-0 Vicryl and Dermabond. Patient was taken recovery in satisfactory condition.  [name], MD, [facility], [place] [phone]

## 2011-09-24 NOTE — H&P (View-Only) (Signed)
Chief Complaint:  Abdominal pain and nausea  History of Present Illness:  [name] is an 56 y.o. female who began having nausea yesterday.  We have been trying to get her on the schedule for laparoscopy and also to examine report because she's been having persistent port pain and reflux symptoms since an upper respiratory tract infection last year. Her weight is 216. She had an APL band and hiatal hernia repair. She has been endoscoped and there is no evidence of erosion. Recent symptoms began yesterday including intractable nausea and some tenderness around the band port make me wonder if she isn't developing a hernia it now could be a Richter's hernia. To delineate this further we will going a CT scan of her abdomen today and will see back try to get her on the schedule for laparoscopy to change out her port if necessary and also to make sure her band is okay on the inside.  Past Medical History  Diagnosis Date  . Allergic rhinitis   . Diabetes mellitus type II   . Headache   . Menopause   . GERD (gastroesophageal reflux disease)   . IBS (irritable bowel syndrome)   . Anxiety   . Depression   . Polyarthralgia   . Nasal congestion   . Cough   . Fever and chills   . Breathing difficult   . Glands swollen   . Hypertension   . Hyperlipemia     Past Surgical History  Procedure Date  . Cesarean section   . Cervical laminectomy   . Cholecystectomy   . Gyn surgery   . Tubal ligation   . Tonsillectomy   . Foot surgery   . Right knee   . Nasal sinus surgery   . Laparoscopic gastric banding   . Abdominal hysterectomy   . Esophagogastroduodenoscopy 06/29/2011    Procedure: ESOPHAGOGASTRODUODENOSCOPY (EGD);  Surgeon: Kandis Cocking, MD;  Location: [facility];  Service: General;  Laterality: N/A;    Current Outpatient Prescriptions  Medication Sig Dispense Refill  . albuterol (PROVENTIL HFA;VENTOLIN HFA) 108 (90 BASE) MCG/ACT inhaler Inhale 1-2 puffs into the lungs every 6  (six) hours as needed for wheezing.  1 Inhaler  0  . ciclopirox (PENLAC) 8 % solution Apply topically at bedtime. Apply over nail and surrounding skin. Apply daily over previous coat. After seven (7) days, may remove with alcohol and continue cycle.  6.6 mL  0  . citalopram (CELEXA) 20 MG tablet Take 1 tablet (20 mg total) by mouth daily.  90 tablet  3  . cyanocobalamin (,VITAMIN B-12,) 1000 MCG/ML injection Inject 1 mL (1,000 mcg total) into the muscle once a week.  10 mL  0  . cyclobenzaprine (FLEXERIL) 10 MG tablet Take 1 tablet (10 mg total) by mouth 3 (three) times daily as needed.  30 tablet  2  . estradiol (VIVELLE) 0.1 MG/24HR Place 1 patch (0.1 mg total) onto the skin 2 (two) times a week.  8 patch  11  . furosemide (LASIX) 20 MG tablet 1 po qd prn  90 tablet  3  . glucose blood (ACCU-CHEK AVIVA) test strip Test 3 times per day as directed  100 each  3  . Insulin Pen Needle (B-D UF III MINI PEN NEEDLES) 31G X 5 MM MISC Inject 1.2 Units into the skin as directed.  100 each  11  . Insulin Pen Needle (NOVOFINE) 30G X 8 MM MISC Inject 1 packet into the skin 3 (three) times  daily.        Marland Kitchen KLOR-CON M10 10 MEQ tablet TAKE 1 TABLET BY MOUTH DAILY  90 tablet  3  . Lancet Device MISC "avia" check three times a day        . Lansoprazole (PREVACID 24HR PO) Take by mouth daily.      . Levofloxacin (LEVAQUIN PO) Take by mouth.        . Liraglutide (VICTOZA) 18 MG/3ML SOLN Inject 0.2 mLs (1.2 mg total) into the skin daily.  6 mL  1  . loratadine (CLARITIN) 10 MG tablet Take 10 mg by mouth daily.        Marland Kitchen olmesartan (BENICAR) 10 mg TABS Take 0.5 tablets (10 mg total) by mouth daily.  45 tablet  3  . rosuvastatin (CRESTOR) 20 MG tablet Take 1 tablet (20 mg total) by mouth daily.  90 tablet  3  . SYRINGE-NEEDLE, DISP, 3 ML (BD ECLIPSE SYRINGE) 25G X 5/8" 3 ML MISC Inject 1 mL as directed once a week.  5 each  1   Isoniazid; Hctz; Promethazine hcl; Bactrim; Codeine phosphate; Fentanyl; Macrolides and  ketolides; Morphine; Morphine sulfate; Nitrofurantoin; Penicillins; Percocet; Phenergan; Sulfamethoxazole w/trimethoprim; and Trimethoprim Family History  Problem Relation Age of Onset  . Diabetes Mother   . Stroke Mother   . Breast cancer Mother   . Atrial fibrillation Mother   . Hypertension Mother   . Cancer Mother     breast  . Diabetes Father   . Stroke Father   . Hypertension Father   . Heart attack Father   . Thyroid disease    . Heart disease     Social History:   reports that she has never smoked. She has never used smokeless tobacco. She reports that she does not drink alcohol or use illicit drugs.   REVIEW OF SYSTEMS - PERTINENT POSITIVES ONLY: noncontributory  Physical Exam:   Blood pressure 119/73, pulse 72, temperature 97.6 F (36.4 C), temperature source Temporal, height 5\' 11"  (1.803 m), weight 216 lb 9.6 oz (98.249 kg), SpO2 97.00%. Body mass index is 30.21 kg/(m^2).  Gen:  WDWN whhite femal NAD  Neurological: Alert and oriented to person, place, and time. Motor and sensory function is grossly intact  Head: Normocephalic and atraumatic.  Eyes: Conjunctivae are normal. Pupils are equal, round, and reactive to light. No scleral icterus.  Neck: Normal range of motion. Neck supple. No tracheal deviation or thyromegaly present.  Cardiovascular:  SR without murmurs or gallops.  No carotid bruits Respiratory: Effort normal.  No respiratory distress. No chest wall tenderness. Breath sounds normal.  No wheezes, rales or rhonchi.  Abdomen:  More prominent than ususal port site.  Cannot rule out hernia GU: Musculoskeletal: Normal range of motion. Extremities are nontender. No cyanosis, edema or clubbing noted Lymphadenopathy: No cervical, preauricular, postauricular or axillary adenopathy is present Skin: Skin is warm and dry. No rash noted. No diaphoresis. No erythema. No pallor. Pscyh: Normal mood and affect. Behavior is normal. Judgment and thought content normal.    LABORATORY RESULTS: No results found for this or any previous visit (from the past 48 hour(s)).  RADIOLOGY RESULTS: No results found.  Problem List: Patient Active Problem List  Diagnoses  . ONYCHOMYCOSIS, BILATERAL  . DIABETES MELLITUS, TYPE II  . VITAMIN B12 DEFICIENCY  . UNSPECIFIED VITAMIN D DEFICIENCY  . HYPERLIPIDEMIA NEC/NOS  . MORBID OBESITY  . OTHER SPECIFIED ANEMIAS  . ANXIETY  . DEPRESSION  . HYPERTENSION, ESSENTIAL NOS  .  ACUTE SINUSITIS, UNSPECIFIED  . ACUTE PHARYNGITIS  . BRONCHITIS, ACUTE  . ALLERGIC RHINITIS  . CELLULITIS  . Pain in Joint, Multiple Sites  . BACK PAIN, LUMBAR  . Unspecified Myalgia and Myositis  . SKIN RASH  . HEADACHE  . COUGH  . NAUSEA  . DIARRHEA  . STRESS INCONTINENCE  . BARIATRIC SURGERY STATUS  . ABDOMINAL PAIN, ACUTE  . 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  Marland Kitchen GERD (gastroesophageal reflux disease)    Assessment & Plan: Prior abdomen pain with port site pain.  Now with nausea.  Plan CT abdomen and move ahead with laparoscopy and port change    [name], MD, [facility] [phone] beeper [phone]  09/22/2011 10:28 AM

## 2011-09-24 NOTE — Transfer of Care (Signed)
Immediate Anesthesia Transfer of Care Note  Patient: [name]  Procedure(s) Performed: Procedure(s) (LRB): LAPAROSCOPY DIAGNOSTIC (N/A) GASTRIC BANDING PORT REVISION (N/A) MESH APPLIED TO LAP PORT ()  Patient Location: PACU  Anesthesia Type: General  Level of Consciousness: sedated, patient cooperative and responds to stimulaton  Airway & Oxygen Therapy: Patient Spontanous Breathing and Patient connected to face mask oxgen  Post-op Assessment: Report given to PACU RN and Post -op Vital signs reviewed and stable  Post vital signs: Reviewed and stable  Complications: No apparent anesthesia complications

## 2011-09-24 NOTE — Anesthesia Preprocedure Evaluation (Signed)
Anesthesia Evaluation  Patient identified by MRN, date of birth, ID band Patient awake    Reviewed: Allergy & Precautions, H&P , NPO status , Patient's Chart, lab work & pertinent test results  Airway Mallampati: II TM Distance: >3 FB Neck ROM: Full    Dental  (+) Teeth Intact and Dental Advisory Given   Pulmonary neg pulmonary ROS,  breath sounds clear to auscultation  Pulmonary exam normal       Cardiovascular hypertension, Pt. on medications Rhythm:Regular Rate:Normal     Neuro/Psych  Headaches, PSYCHIATRIC DISORDERS Anxiety Depression  Neuromuscular disease negative psych ROS   GI/Hepatic negative GI ROS, Neg liver ROS, GERD-  Medicated and Poorly Controlled,  Endo/Other  negative endocrine ROSDiabetes mellitus-, Type 2, Oral Hypoglycemic Agents  Renal/GU negative Renal ROS  negative genitourinary   Musculoskeletal negative musculoskeletal ROS (+)   Abdominal (+) + obese,   Peds negative pediatric ROS (+)  Hematology negative hematology ROS (+)   Anesthesia Other Findings   Reproductive/Obstetrics negative OB ROS                           Anesthesia Physical Anesthesia Plan  ASA: III  Anesthesia Plan: General   Post-op Pain Management:    Induction: Intravenous  Airway Management Planned: Oral ETT  Additional Equipment:   Intra-op Plan:   Post-operative Plan: Extubation in OR  Informed Consent: I have reviewed the patients History and Physical, chart, labs and discussed the procedure including the risks, benefits and alternatives for the proposed anesthesia with the patient or authorized representative who has indicated his/her understanding and acceptance.   Dental advisory given  Plan Discussed with: CRNA  Anesthesia Plan Comments:         Anesthesia Quick Evaluation

## 2011-09-24 NOTE — Discharge Instructions (Signed)
No driving today or while taking Lortab. Clear liquids until speaks to MD. MD will call to schedule appt. May shower tomorrow. Dermabond will come off.  In 10-14 days. Pat dry, do not peel off Call MD for any signs of infection

## 2011-09-24 NOTE — Interval H&P Note (Signed)
History and Physical Interval Note:  09/24/2011 6:59 AM  [name]  has presented today for surgery, with the diagnosis of pain in lap band port   The various methods of treatment have been discussed with the patient and family. After consideration of risks, benefits and other options for treatment, the patient has consented to  Procedure(s) (LRB): LAPAROSCOPY DIAGNOSTIC (N/A) GASTRIC BANDING PORT REVISION (N/A) LAPAROSCOPIC REPAIR AND REMOVAL OF GASRIC BAND (N/A) as a surgical intervention .  The patients' history has been reviewed, patient examined, no change in status, stable for surgery.  I have reviewed the patients' chart and labs.  Questions were answered to the patient's satisfaction.     Demarquis Osley B

## 2011-09-24 NOTE — Anesthesia Postprocedure Evaluation (Signed)
Anesthesia Post Note  Patient: [name]  Procedure(s) Performed: Procedure(s) (LRB): LAPAROSCOPY DIAGNOSTIC (N/A) GASTRIC BANDING PORT REVISION (N/A) MESH APPLIED TO LAP PORT ()  Anesthesia type: General  Patient location: PACU  Post pain: Pain level controlled  Post assessment: Post-op Vital signs reviewed  Last Vitals:  Filed Vitals:   09/24/11 0945  BP:   Pulse: 63  Temp: 36.8 C  Resp: 14    Post vital signs: Reviewed  Level of consciousness: sedated  Complications: No apparent anesthesia complications

## 2011-09-24 NOTE — Progress Notes (Addendum)
Ambulated in hall. A little dizzy, but stable and ready to go home. No other change in status

## 2011-09-24 NOTE — H&P (Signed)
Chief Complaint: Abdominal pain and nausea  History of Present Illness: [name] is an 56 y.o. female who began having nausea yesterday. We have been trying to get her on the schedule for laparoscopy and also to examine report because she's been having persistent port pain and reflux symptoms since an upper respiratory tract infection last year. Her weight is 216. She had an APL band and hiatal hernia repair. She has been endoscoped and there is no evidence of erosion. Recent symptoms began yesterday including intractable nausea and some tenderness around the band port make me wonder if she isn't developing a hernia it now could be a Richter's hernia. To delineate this further we will going a CT scan of her abdomen today and will see back try to get her on the schedule for laparoscopy to change out her port if necessary and also to make sure her band is okay on the inside.  Past Medical History   Diagnosis  Date   .  Allergic rhinitis    .  Diabetes mellitus type II    .  Headache    .  Menopause    .  GERD (gastroesophageal reflux disease)    .  IBS (irritable bowel syndrome)    .  Anxiety    .  Depression    .  Polyarthralgia    .  Nasal congestion    .  Cough    .  Fever and chills    .  Breathing difficult    .  Glands swollen    .  Hypertension    .  Hyperlipemia     Past Surgical History   Procedure  Date   .  Cesarean section    .  Cervical laminectomy    .  Cholecystectomy    .  Gyn surgery    .  Tubal ligation    .  Tonsillectomy    .  Foot surgery    .  Right knee    .  Nasal sinus surgery    .  Laparoscopic gastric banding    .  Abdominal hysterectomy    .  Esophagogastroduodenoscopy  06/29/2011     Procedure: ESOPHAGOGASTRODUODENOSCOPY (EGD); Surgeon: Kandis Cocking, MD; Location: [facility]; Service: General; Laterality: N/A;    Current Outpatient Prescriptions   Medication  Sig  Dispense  Refill   .  albuterol (PROVENTIL HFA;VENTOLIN HFA) 108 (90 BASE)  MCG/ACT inhaler  Inhale 1-2 puffs into the lungs every 6 (six) hours as needed for wheezing.  1 Inhaler  0   .  ciclopirox (PENLAC) 8 % solution  Apply topically at bedtime. Apply over nail and surrounding skin. Apply daily over previous coat. After seven (7) days, may remove with alcohol and continue cycle.  6.6 mL  0   .  citalopram (CELEXA) 20 MG tablet  Take 1 tablet (20 mg total) by mouth daily.  90 tablet  3   .  cyanocobalamin (,VITAMIN B-12,) 1000 MCG/ML injection  Inject 1 mL (1,000 mcg total) into the muscle once a week.  10 mL  0   .  cyclobenzaprine (FLEXERIL) 10 MG tablet  Take 1 tablet (10 mg total) by mouth 3 (three) times daily as needed.  30 tablet  2   .  estradiol (VIVELLE) 0.1 MG/24HR  Place 1 patch (0.1 mg total) onto the skin 2 (two) times a week.  8 patch  11   .  furosemide (LASIX) 20 MG  tablet  1 po qd prn  90 tablet  3   .  glucose blood (ACCU-CHEK AVIVA) test strip  Test 3 times per day as directed  100 each  3   .  Insulin Pen Needle (B-D UF III MINI PEN NEEDLES) 31G X 5 MM MISC  Inject 1.2 Units into the skin as directed.  100 each  11   .  Insulin Pen Needle (NOVOFINE) 30G X 8 MM MISC  Inject 1 packet into the skin 3 (three) times daily.     Marland Kitchen  KLOR-CON M10 10 MEQ tablet  TAKE 1 TABLET BY MOUTH DAILY  90 tablet  3   .  Lancet Device MISC  "avia" check three times a day     .  Lansoprazole (PREVACID 24HR PO)  Take by mouth daily.     .  Levofloxacin (LEVAQUIN PO)  Take by mouth.     .  Liraglutide (VICTOZA) 18 MG/3ML SOLN  Inject 0.2 mLs (1.2 mg total) into the skin daily.  6 mL  1   .  loratadine (CLARITIN) 10 MG tablet  Take 10 mg by mouth daily.     Marland Kitchen  olmesartan (BENICAR) 10 mg TABS  Take 0.5 tablets (10 mg total) by mouth daily.  45 tablet  3   .  rosuvastatin (CRESTOR) 20 MG tablet  Take 1 tablet (20 mg total) by mouth daily.  90 tablet  3   .  SYRINGE-NEEDLE, DISP, 3 ML (BD ECLIPSE SYRINGE) 25G X 5/8" 3 ML MISC  Inject 1 mL as directed once a week.  5 each  1     Isoniazid; Hctz; Promethazine hcl; Bactrim; Codeine phosphate; Fentanyl; Macrolides and ketolides; Morphine; Morphine sulfate; Nitrofurantoin; Penicillins; Percocet; Phenergan; Sulfamethoxazole w/trimethoprim; and Trimethoprim  Family History   Problem  Relation  Age of Onset   .  Diabetes  Mother    .  Stroke  Mother    .  Breast cancer  Mother    .  Atrial fibrillation  Mother    .  Hypertension  Mother    .  Cancer  Mother       breast    .  Diabetes  Father    .  Stroke  Father    .  Hypertension  Father    .  Heart attack  Father    .  Thyroid disease     .  Heart disease     Social History: reports that she has never smoked. She has never used smokeless tobacco. She reports that she does not drink alcohol or use illicit drugs.  REVIEW OF SYSTEMS - PERTINENT POSITIVES ONLY:  noncontributory  Physical Exam:  Blood pressure 119/73, pulse 72, temperature 97.6 F (36.4 C), temperature source Temporal, height 5\' 11"  (1.803 m), weight 216 lb 9.6 oz (98.249 kg), SpO2 97.00%.  Body mass index is 30.21 kg/(m^2).  Gen: WDWN whhite femal NAD  Neurological: Alert and oriented to person, place, and time. Motor and sensory function is grossly intact  Head: Normocephalic and atraumatic.  Eyes: Conjunctivae are normal. Pupils are equal, round, and reactive to light. No scleral icterus.  Neck: Normal range of motion. Neck supple. No tracheal deviation or thyromegaly present.  Cardiovascular: SR without murmurs or gallops. No carotid bruits  Respiratory: Effort normal. No respiratory distress. No chest wall tenderness. Breath sounds normal. No wheezes, rales or rhonchi.  Abdomen: More prominent than ususal port site. Cannot rule out hernia  GU:  Musculoskeletal: Normal range of motion. Extremities are nontender. No cyanosis, edema or clubbing noted Lymphadenopathy: No cervical, preauricular, postauricular or axillary adenopathy is present Skin: Skin is warm and dry. No rash noted. No  diaphoresis. No erythema. No pallor. Pscyh: Normal mood and affect. Behavior is normal. Judgment and thought content normal.  LABORATORY RESULTS:  No results found for this or any previous visit (from the past 48 hour(s)).  RADIOLOGY RESULTS:  No results found.  Problem List:  Patient Active Problem List   Diagnoses   .  ONYCHOMYCOSIS, BILATERAL   .  DIABETES MELLITUS, TYPE II   .  VITAMIN B12 DEFICIENCY   .  UNSPECIFIED VITAMIN D DEFICIENCY   .  HYPERLIPIDEMIA NEC/NOS   .  MORBID OBESITY   .  OTHER SPECIFIED ANEMIAS   .  ANXIETY   .  DEPRESSION   .  HYPERTENSION, ESSENTIAL NOS   .  ACUTE SINUSITIS, UNSPECIFIED   .  ACUTE PHARYNGITIS   .  BRONCHITIS, ACUTE   .  ALLERGIC RHINITIS   .  CELLULITIS   .  Pain in Joint, Multiple Sites   .  BACK PAIN, LUMBAR   .  Unspecified Myalgia and Myositis   .  SKIN RASH   .  HEADACHE   .  COUGH   .  NAUSEA   .  DIARRHEA   .  STRESS INCONTINENCE   .  BARIATRIC SURGERY STATUS   .  ABDOMINAL PAIN, ACUTE   .  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   Marland Kitchen  GERD (gastroesophageal reflux disease)   Assessment & Plan:  Prior abdomen pain with port site pain. Now with nausea.  CT abdomen did not show obvious source of pain.    [name], MD, [facility]  [phone] beeper  [phone] There has been no change in the patient's past medical history or physical exam in the past 24 hours to the best of my knowledge. I examined the patient in the holding area and have made any changes to the history and physical exam report that is included above.   Expectations and outcome results have been discussed with the patient to include risks and benefits.  All questions have been answered and we will proceed with previously discussed procedure noted and signed in the consent form in the patient's record.    Lizmarie Witters BMD FACS 7:02 AM  09/24/2011

## 2011-09-24 NOTE — Progress Notes (Signed)
Pt was on clear liquids 09/23/11 and took Mag Citrate with good results

## 2011-09-28 ENCOUNTER — Telehealth (INDEPENDENT_AMBULATORY_CARE_PROVIDER_SITE_OTHER): Payer: Self-pay

## 2011-09-28 NOTE — Telephone Encounter (Signed)
Patient stated per Dr. [name] to be seen post op this week.  So that she may go back to work next week. I did not want to alter his schedule. Please call patient.

## 2011-09-30 ENCOUNTER — Encounter (INDEPENDENT_AMBULATORY_CARE_PROVIDER_SITE_OTHER): Payer: Self-pay | Admitting: General Surgery

## 2011-10-06 ENCOUNTER — Encounter (HOSPITAL_COMMUNITY): Payer: Self-pay | Admitting: Surgery

## 2011-10-11 ENCOUNTER — Encounter (INDEPENDENT_AMBULATORY_CARE_PROVIDER_SITE_OTHER): Payer: Self-pay | Admitting: Surgery

## 2011-10-13 ENCOUNTER — Encounter (INDEPENDENT_AMBULATORY_CARE_PROVIDER_SITE_OTHER): Payer: Self-pay | Admitting: Surgery

## 2011-10-13 ENCOUNTER — Ambulatory Visit (INDEPENDENT_AMBULATORY_CARE_PROVIDER_SITE_OTHER): Payer: Commercial Managed Care - PPO | Admitting: Surgery

## 2011-10-13 NOTE — Progress Notes (Signed)
[name] 56 y.o.  Body mass index is 30.46 kg/(m^2).  Patient Active Problem List  Diagnoses  . ONYCHOMYCOSIS, BILATERAL  . DIABETES MELLITUS, TYPE II  . VITAMIN B12 DEFICIENCY  . UNSPECIFIED VITAMIN D DEFICIENCY  . HYPERLIPIDEMIA NEC/NOS  . MORBID OBESITY  . OTHER SPECIFIED ANEMIAS  . ANXIETY  . DEPRESSION  . HYPERTENSION, ESSENTIAL NOS  . ACUTE SINUSITIS, UNSPECIFIED  . ACUTE PHARYNGITIS  . BRONCHITIS, ACUTE  . ALLERGIC RHINITIS  . CELLULITIS  . Pain in Joint, Multiple Sites  . BACK PAIN, LUMBAR  . Unspecified Myalgia and Myositis  . SKIN RASH  . HEADACHE  . COUGH  . NAUSEA  . DIARRHEA  . STRESS INCONTINENCE  . BARIATRIC SURGERY STATUS  . ABDOMINAL PAIN, ACUTE  . 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  Marland Kitchen GERD (gastroesophageal reflux disease)    Allergies  Allergen Reactions  . Isoniazid     Severe SOB  . Hctz (Hydrochlorothiazide)     rash  . Promethazine Hcl (Benzyl Alc-Promethazine)     IF GIVEN  IV-hallucinations     CAN TAKE PO PHENERGAN  . Fentanyl     REACTION: whelps  . Macrolides And Ketolides     rash  . Morphine     REACTION: severe vomiting  . Nitrofurantoin     REACTION: whelps  . Percocet (Oxycodone-Acetaminophen)     REACTION: severe itching  . Sulfamethoxazole W/Trimethoprim     Septra / Bactrim  --REACTION: rash    Past Surgical History  Procedure Date  . Cesarean section 1989 & 1993    X 2  . Cervical laminectomy 2005 & 2009    X 2   NO ROM PROBLEMS  . Foot surgery 2005    RT HEEL  . Right knee 1981    ARTHROSCOPY AND ARTHROTOMY  . Nasal sinus surgery 1995 & 1997    X 2  . Laparoscopic gastric banding 12/30/09  . Esophagogastroduodenoscopy 06/29/2011    Procedure: ESOPHAGOGASTRODUODENOSCOPY (EGD);  Surgeon: Kandis Cocking, MD;  Location: [facility];  Service: General;  Laterality: N/A;  . Hysteroscopy 1999  . Tonsillectomy 1971  . Abdominal hysterectomy 12/1999  . Cholecystectomy  1986  . Tubal ligation 1993    WITH C -SECTION  . Laparoscopy 09/24/2011    Procedure: LAPAROSCOPY DIAGNOSTIC;  Surgeon: Valarie Merino, MD;  Location: [facility];  Service: General;  Laterality: N/A;  . Gastric banding port revision 09/24/2011    Procedure: GASTRIC BANDING PORT REVISION;  Surgeon: Valarie Merino, MD;  Location: [facility];  Service: General;  Laterality: N/A;   [name], DO, DO No diagnosis found.  3 weeks out from revision of lapband port and examination of lapband.  Today I inserted 1 cc in her band.  She tolerated clears after that.  Will see her back in 2 months. [name], MD, [facility] [phone] beeper [phone]  10/13/2011 11:51 AM

## 2011-10-13 NOTE — Patient Instructions (Signed)

## 2011-11-03 ENCOUNTER — Telehealth: Payer: Self-pay

## 2011-11-03 NOTE — Telephone Encounter (Signed)
Omeprazole 20 mg 30 minutes before breakfast dispense 30

## 2011-11-03 NOTE — Telephone Encounter (Signed)
Fax from pharmacy stating the patient has been taking prevacid 15 mg OTC but for cost savings she would like and Rx for a PPI. Omeprazole 20 is only 4 dollars a month for the patient and the request is to switch. Please advise     KP

## 2011-11-04 MED ORDER — OMEPRAZOLE 20 MG PO CPDR: DELAYED_RELEASE_CAPSULE | ORAL | Status: DC

## 2011-11-08 ENCOUNTER — Emergency Department
Admission: EM | Admit: 2011-11-08 | Discharge: 2011-11-08 | Disposition: A | Discharge status: Home / Self Care | Payer: 59 | Source: Home / Self Care

## 2011-11-08 ENCOUNTER — Emergency Department: Admit: 2011-11-08 | Discharge: 2011-11-08 | Disposition: A | Discharge status: Home / Self Care | Payer: 59

## 2011-11-08 ENCOUNTER — Emergency Department: Admit: 2011-11-08 | Payer: 59

## 2011-11-08 DIAGNOSIS — M2550 Pain in unspecified joint: Secondary | ICD-10-CM

## 2011-11-08 DIAGNOSIS — S93409A Sprain of unspecified ligament of unspecified ankle, initial encounter: Secondary | ICD-10-CM

## 2011-11-08 DIAGNOSIS — L02619 Cutaneous abscess of unspecified foot: Secondary | ICD-10-CM

## 2011-11-08 MED ORDER — DOXYCYCLINE HYCLATE 100 MG PO TABS: 100.0000 mg | ORAL_TABLET | Freq: Two times a day (BID) | ORAL | Status: AC

## 2011-11-08 MED ORDER — ONDANSETRON HCL 4 MG PO TABS: 4.0000 mg | ORAL_TABLET | Freq: Three times a day (TID) | ORAL | Status: AC | PRN

## 2011-11-08 NOTE — Discharge Instructions (Signed)
Ankle Sprain An ankle sprain is an injury to the strong, fibrous tissues (ligaments) that hold the bones of your ankle joint together.  CAUSES Ankle sprain usually is caused by a fall or by twisting your ankle. People who participate in sports are more prone to these types of injuries.  SYMPTOMS  Symptoms of ankle sprain include:  Pain in your ankle. The pain may be present at rest or only when you are trying to stand or walk.   Swelling.   Bruising. Bruising may develop immediately or within 1 to 2 days after your injury.   Difficulty standing or walking.  DIAGNOSIS  Your caregiver will ask you details about your injury and perform a physical exam of your ankle to determine if you have an ankle sprain. During the physical exam, your caregiver will press and squeeze specific areas of your foot and ankle. Your caregiver will try to move your ankle in certain ways. An X-ray exam may be done to be sure a bone was not broken or a ligament did not separate from one of the bones in your ankle (avulsion).  TREATMENT  Certain types of braces can help stabilize your ankle. Your caregiver can make a recommendation for this. Your caregiver may recommend the use of medication for pain. If your sprain is severe, your caregiver may refer you to a surgeon who helps to restore function to parts of your skeletal system (orthopedist) or a physical therapist. HOME CARE INSTRUCTIONS  Apply ice to your injury for 1 to 2 days or as directed by your caregiver. Applying ice helps to reduce inflammation and pain.  Put ice in a plastic bag.   Place a towel between your skin and the bag.   Leave the ice on for 15 to 20 minutes at a time, every 2 hours while you are awake.   Take over-the-counter or prescription medicines for pain, discomfort, or fever only as directed by your caregiver.   Keep your injured leg elevated, when possible, to lessen swelling.   If your caregiver recommends crutches, use them as  instructed. Gradually, put weight on the affected ankle. Continue to use crutches or a cane until you can walk without feeling pain in your ankle.   If you have a plaster splint, wear the splint as directed by your caregiver. Do not rest it on anything harder than a pillow the first 24 hours. Do not put weight on it. Do not get it wet. You may take it off to take a shower or bath.   You may have been given an elastic bandage to wear around your ankle to provide support. If the elastic bandage is too tight (you have numbness or tingling in your foot or your foot becomes cold and blue), adjust the bandage to make it comfortable.   If you have an air splint, you may blow more air into it or let air out to make it more comfortable. You may take your splint off at night and before taking a shower or bath.   Wiggle your toes in the splint several times per day if you are able.  SEEK MEDICAL CARE IF:   You have an increase in bruising, swelling, or pain.   Your toes feel cold.   Pain relief is not achieved with medication.  SEEK IMMEDIATE MEDICAL CARE IF: Your toes are numb or blue or you have severe pain. MAKE SURE YOU:   Understand these instructions.   Will watch your condition.     Will get help right away if you are not doing well or get worse.  Document Released: 06/28/2005 Document Revised: 06/17/2011 Document Reviewed: 01/31/2008 [facility] Patient Information 2012 [place], [place].  Cellulitis Cellulitis is an infection of the skin and the tissue beneath it. The area is typically red and tender. It is caused by germs (bacteria) (usually staph or strep) that enter the body through cuts or sores. Cellulitis most commonly occurs in the arms or lower legs.  HOME CARE INSTRUCTIONS   If you are given a prescription for medications which kill germs (antibiotics), take as directed until finished.   If the infection is on the arm or leg, keep the limb elevated as able.   Use a warm cloth  several times per day to relieve pain and encourage healing.   See your caregiver for recheck of the infected site as directed if problems arise.   Only take over-the-counter or prescription medicines for pain, discomfort, or fever as directed by your caregiver.  SEEK MEDICAL CARE IF:   The area of redness (inflammation) is spreading, there are red streaks coming from the infected site, or if a part of the infection begins to turn dark in color.   The joint or bone underneath the infected skin becomes painful after the skin has healed.   The infection returns in the same or another area after it seems to have gone away.   A boil or bump swells up. This may be an abscess.   New, unexplained problems such as pain or fever develop.  SEEK IMMEDIATE MEDICAL CARE IF:   You have a fever.   You or your child feels drowsy or lethargic.   There is vomiting, diarrhea, or lasting discomfort or feeling ill (malaise) with muscle aches and pains.  MAKE SURE YOU:   Understand these instructions.   Will watch your condition.   Will get help right away if you are not doing well or get worse.  Document Released: 04/07/2005 Document Revised: 06/17/2011 Document Reviewed: 02/14/2008 [facility] Patient Information 2012 [place], [place].

## 2011-11-08 NOTE — ED Provider Notes (Signed)
History     CSN: [id number]  Arrival date & time 11/08/11  1821   First MD Initiated Contact with Patient 11/08/11 1836      Chief Complaint  Patient presents with  . Ankle Pain    (Consider location/radiation/quality/duration/timing/severity/associated sxs/prior treatment) HPI Comments: Pt states that she accidentally rolled her ankle while leaving her house Saturday evening and struck the lateral aspect of her L ankle on broken concrete. Pt states that area has gotten mildly red since this incident. Pt works as a Engineer, civil (consulting) and noticed serosanguenous drainage from area since earlier today. Mild swelling. No significant redness. Pt has had some persistent pain on lateral malleolus since initial trauma.  Pt baseline diabetic. CBGs have been in 90s. Most recent a1c 6.2.   Patient is a 56 y.o. female presenting with ankle pain. The history is provided by the patient.  Ankle Pain This is a new problem. The current episode started 2 days ago.    Past Medical History  Diagnosis Date  . Allergic rhinitis   . Diabetes mellitus type II   . Headache   . Menopause   . GERD (gastroesophageal reflux disease)   . IBS (irritable bowel syndrome)   . Anxiety   . Depression   . Hypertension   . Hyperlipemia     Past Surgical History  Procedure Date  . Cesarean section 1989 & 1993    X 2  . Cervical laminectomy 2005 & 2009    X 2   NO ROM PROBLEMS  . Foot surgery 2005    RT HEEL  . Right knee 1981    ARTHROSCOPY AND ARTHROTOMY  . Nasal sinus surgery 1995 & 1997    X 2  . Laparoscopic gastric banding 12/30/09  . Esophagogastroduodenoscopy 06/29/2011    Procedure: ESOPHAGOGASTRODUODENOSCOPY (EGD);  Surgeon: Kandis Cocking, MD;  Location: [facility];  Service: General;  Laterality: N/A;  . Hysteroscopy 1999  . Tonsillectomy 1971  . Abdominal hysterectomy 12/1999  . Cholecystectomy 1986  . Tubal ligation 1993    WITH C -SECTION  . Laparoscopy 09/24/2011    Procedure: LAPAROSCOPY  DIAGNOSTIC;  Surgeon: Valarie Merino, MD;  Location: [facility];  Service: General;  Laterality: N/A;  . Gastric banding port revision 09/24/2011    Procedure: GASTRIC BANDING PORT REVISION;  Surgeon: Valarie Merino, MD;  Location: [facility];  Service: General;  Laterality: N/A;    Family History  Problem Relation Age of Onset  . Diabetes Mother   . Stroke Mother   . Breast cancer Mother   . Atrial fibrillation Mother   . Hypertension Mother   . Cancer Mother     breast  . Diabetes Father   . Stroke Father   . Hypertension Father   . Heart attack Father   . Thyroid disease    . Heart disease      History  Substance Use Topics  . Smoking status: Never Smoker   . Smokeless tobacco: Never Used  . Alcohol Use: No    OB History    Grav Para Term Preterm Abortions TAB SAB Ect Mult Living                  Review of Systems  Allergies  Isoniazid; Hctz; Promethazine hcl; Fentanyl; Macrolides and ketolides; Morphine; Nitrofurantoin; Percocet; and Sulfamethoxazole w-trimethoprim  Home Medications   Current Outpatient Rx  Name Route Sig Dispense Refill  . ALBUTEROL SULFATE HFA 108 (90 BASE) MCG/ACT IN AERS Inhalation  Inhale 1-2 puffs into the lungs every 6 (six) hours as needed for wheezing. 1 Inhaler 0  . CICLOPIROX 8 % EX SOLN Topical Apply topically at bedtime. Apply over nail and surrounding skin. Apply daily over previous coat. After seven (7) days, may remove with alcohol and continue cycle. 6.6 mL 0  . CITALOPRAM HYDROBROMIDE 20 MG PO TABS Oral Take 20 mg by mouth every morning.    Marland Kitchen CYANOCOBALAMIN 1000 MCG/ML IJ SOLN Intramuscular Inject 1 mL (1,000 mcg total) into the muscle once a week. 10 mL 0    For 4 weeks then monthly, then recheck level  . CYCLOBENZAPRINE HCL 10 MG PO TABS Oral Take 10 mg by mouth 3 (three) times daily as needed. For muscle spasms    . ESTRADIOL 0.1 MG/24HR TD PTTW Transdermal Place 1 patch (0.1 mg total) onto the skin 2 (two) times a week. 8 patch 11   . FUROSEMIDE 20 MG PO TABS Oral Take 20 mg by mouth daily as needed. For swelling    . GLUCOSE BLOOD VI STRP  Test 3 times per day as directed 100 each 3  . LANCET DEVICE MISC  "avia" check three times a day      . PREVACID 24HR PO Oral Take 1 capsule by mouth every morning.     Marland Kitchen LIRAGLUTIDE 18 MG/3ML [place] SOLN Subcutaneous Inject 0.2 mLs (1.2 mg total) into the skin daily. 6 mL 1  . LORATADINE 10 MG PO TABS Oral Take 10 mg by mouth every morning.     Marland Kitchen OLMESARTAN 10 MG HALF TABLET Oral Take 10 mg by mouth every morning.    Marland Kitchen OMEPRAZOLE 20 MG PO CPDR  Take one capsule 30 minutes before breakfast 30 capsule 0  . POTASSIUM CHLORIDE CRYS ER 10 MEQ PO TBCR      . ROSUVASTATIN CALCIUM 20 MG PO TABS Oral Take 20 mg by mouth every morning.      There were no vitals taken for this visit.  Physical Exam  Constitutional: She appears well-developed and well-nourished.  HENT:  Head: Normocephalic and atraumatic.  Eyes: EOM are normal. Pupils are equal, round, and reactive to light.  Neck: Normal range of motion.  Cardiovascular: Normal rate and regular rhythm.   Pulmonary/Chest: Effort normal and breath sounds normal.  Musculoskeletal:       Feet:       2 open denuded regions on lateral ankle with peripheral erythema, no purulent drainage.   Skin:     Extremities: good distal blood flow, neurovascularly intact   ED Course  Procedures (including critical care time)  Labs Reviewed - No data to display No results found.   No diagnosis found.    MDM  Concominant lateral ankle sprain and superficial cellulitis. Xrays negative for fracture or joint involvement. Will treat with doxy. mulitple antibiotic drug allergies. Doxy is likely best option. Infectious red flags discussed at length. Follow up with PCP in 24 hours.         Floydene Flock, MD 11/08/11 [phone]

## 2011-11-08 NOTE — ED Notes (Signed)
States she fell on left side on Saturday and noted 2 abrasions to left ankle and swelling noted  this am.

## 2011-11-09 NOTE — ED Provider Notes (Signed)
Agree with exam, assessment, and plan.   Lattie Haw, MD 11/09/11 [phone]

## 2011-11-10 ENCOUNTER — Other Ambulatory Visit: Payer: Self-pay | Admitting: Family Medicine

## 2011-11-10 ENCOUNTER — Encounter: Payer: Self-pay | Admitting: Family Medicine

## 2011-11-10 ENCOUNTER — Ambulatory Visit (INDEPENDENT_AMBULATORY_CARE_PROVIDER_SITE_OTHER): Payer: 59 | Admitting: Family Medicine

## 2011-11-10 VITALS — BP 118/79 | HR 77 | Temp 98.3°F | Ht 70.25 in | Wt 224.6 lb

## 2011-11-10 DIAGNOSIS — Z23 Encounter for immunization: Secondary | ICD-10-CM

## 2011-11-10 DIAGNOSIS — L0291 Cutaneous abscess, unspecified: Secondary | ICD-10-CM

## 2011-11-10 DIAGNOSIS — L0390 Cellulitis, unspecified: Secondary | ICD-10-CM

## 2011-11-10 MED ORDER — TETANUS-DIPHTH-ACELL PERTUSSIS 5-2.5-18.5 LF-MCG/0.5 IM SUSP: 0.5000 mL | Freq: Once | INTRAMUSCULAR | Status: DC

## 2011-11-10 NOTE — Patient Instructions (Signed)
This is a bad case of road rash Continue the Doxycycline twice daily Keep area clean and dry Apply bacitracin twice daily If the redness continues to spread, go to UC for IV Call with any questions or concerns Hang in there!!

## 2011-11-10 NOTE — Progress Notes (Signed)
  Subjective:    Patient ID: [name], female    DOB: [birth year], 56 y.o.   MRN: [id number]  HPI L ankle injury- rolled ankle on Saturday after falling on uneven pavement.  Went to UC on Monday and was given abx for cellulitis.  'it's not doing anything'.  Feels redness may be expanding.  No drainage.   Review of Systems For ROS see HPI     Objective:   Physical Exam  Constitutional: She appears well-developed and well-nourished. No distress.  Skin: Skin is warm and dry.       2 denuded areas on L lateral malleolus w/ surrounding erythema, no induration.          Assessment & Plan:

## 2011-11-16 NOTE — Assessment & Plan Note (Signed)
New to provider.  Pt w/ road rash on lateral malleolus.  Agree w/ use of Doxy.  Pt has been on abx for <48 hrs.  Explained that it can take that long to get into her system and during this time it is not unusual to see the redness expand.  Drew circles around the red areas and encouraged her to watch for extension.  Reviewed supportive care and red flags that should prompt return.  Pt expressed understanding and is in agreement w/ plan.

## 2011-11-25 ENCOUNTER — Encounter (INDEPENDENT_AMBULATORY_CARE_PROVIDER_SITE_OTHER): Payer: Commercial Managed Care - PPO

## 2011-12-15 ENCOUNTER — Encounter (INDEPENDENT_AMBULATORY_CARE_PROVIDER_SITE_OTHER): Payer: Self-pay | Admitting: Surgery

## 2011-12-15 ENCOUNTER — Ambulatory Visit (INDEPENDENT_AMBULATORY_CARE_PROVIDER_SITE_OTHER): Payer: Commercial Managed Care - PPO | Admitting: Surgery

## 2011-12-15 VITALS — BP 120/68 | HR 72 | Temp 97.6°F | Resp 16 | Ht 71.0 in | Wt 221.4 lb

## 2011-12-15 DIAGNOSIS — Z9884 Bariatric surgery status: Secondary | ICD-10-CM

## 2011-12-15 DIAGNOSIS — Z4651 Encounter for fitting and adjustment of gastric lap band: Secondary | ICD-10-CM

## 2011-12-15 NOTE — Patient Instructions (Signed)

## 2011-12-15 NOTE — Progress Notes (Signed)
[name] Body mass index is 30.88 kg/(m^2).  Having regurgitation:  some  Nocturnal reflux?  maybe  Amount of fill  0.3 [name] is under a lot of stress.  Work, her older daughter with Ansbarger's syndrome-all add up to stress.  This may be part of the problem why she drools in the am.  Motility.  Will try Reglan 5 mg at night.  She has taken it before without problems.  Will see again in 4-6 weeks

## 2011-12-31 ENCOUNTER — Encounter: Payer: Self-pay | Admitting: Family Medicine

## 2011-12-31 ENCOUNTER — Ambulatory Visit (INDEPENDENT_AMBULATORY_CARE_PROVIDER_SITE_OTHER): Payer: 59 | Admitting: Family Medicine

## 2011-12-31 VITALS — BP 118/62 | HR 67 | Temp 98.6°F | Wt 227.0 lb

## 2011-12-31 DIAGNOSIS — E119 Type 2 diabetes mellitus without complications: Secondary | ICD-10-CM

## 2011-12-31 DIAGNOSIS — E785 Hyperlipidemia, unspecified: Secondary | ICD-10-CM

## 2011-12-31 DIAGNOSIS — K219 Gastro-esophageal reflux disease without esophagitis: Secondary | ICD-10-CM

## 2011-12-31 DIAGNOSIS — R109 Unspecified abdominal pain: Secondary | ICD-10-CM

## 2011-12-31 DIAGNOSIS — R197 Diarrhea, unspecified: Secondary | ICD-10-CM

## 2011-12-31 DIAGNOSIS — Z9884 Bariatric surgery status: Secondary | ICD-10-CM

## 2011-12-31 LAB — HEPATIC FUNCTION PANEL
ALT: 25 U/L (ref 0–35)
AST: 22 U/L (ref 0–37)
Albumin: 3.9 g/dL (ref 3.5–5.2)
Alkaline Phosphatase: 73 U/L (ref 39–117)
Bilirubin, Direct: 0 mg/dL (ref 0.0–0.3)
Total Bilirubin: 0.7 mg/dL (ref 0.3–1.2)
Total Protein: 7.3 g/dL (ref 6.0–8.3)

## 2011-12-31 LAB — BASIC METABOLIC PANEL
BUN: 13 mg/dL (ref 6–23)
CO2: 31 mEq/L (ref 19–32)
Calcium: 9.3 mg/dL (ref 8.4–10.5)
Chloride: 103 mEq/L (ref 96–112)
Creatinine, Ser: 0.9 mg/dL (ref 0.4–1.2)
GFR: 67.36 mL/min (ref >60.00)
Glucose, Bld: 103 mg/dL — ABNORMAL HIGH (ref 70–99)
Potassium: 4.7 mEq/L (ref 3.5–5.1)
Sodium: 140 mEq/L (ref 135–145)

## 2011-12-31 LAB — POCT URINALYSIS DIPSTICK
Bilirubin, UA: NEGATIVE
Blood, UA: NEGATIVE
Glucose, UA: NEGATIVE
Ketones, UA: NEGATIVE
Leukocytes, UA: NEGATIVE
Nitrite, UA: NEGATIVE
Protein, UA: NEGATIVE
Spec Grav, UA: 1.01
Urobilinogen, UA: 0.2
pH, UA: 7

## 2011-12-31 LAB — MICROALBUMIN / CREATININE URINE RATIO
Creatinine,U: 110 mg/dL
Microalb Creat Ratio: 0.4 mg/g (ref 0.0–30.0)
Microalb, Ur: 0.4 mg/dL (ref 0.0–1.9)

## 2011-12-31 LAB — LIPID PANEL
Cholesterol: 168 mg/dL (ref 0–200)
HDL: 55.8 mg/dL (ref >39.00)
LDL Cholesterol: 97 mg/dL (ref 0–99)
Total CHOL/HDL Ratio: 3
Triglycerides: 76 mg/dL (ref 0.0–149.0)
VLDL: 15.2 mg/dL (ref 0.0–40.0)

## 2011-12-31 LAB — HEMOGLOBIN A1C: Hgb A1c MFr Bld: 6.1 % (ref 4.6–6.5)

## 2011-12-31 NOTE — Progress Notes (Signed)
  Subjective:    Patient ID: [name], female    DOB: 1956-04-18, 56 y.o.   MRN: [id number]  HPI Pt here to discuss pain at lap band port, nausea, vomiting diarrhea.  Pt has appoinment with surgery to discuss changing lap band to gastric sleeve. Pain is intense and new port did not help.   Review of Systems    as above Objective:   Physical Exam  Constitutional: She is oriented to person, place, and time. She appears well-developed and well-nourished.  Cardiovascular: Normal rate, regular rhythm and normal heart sounds.   Pulmonary/Chest: Effort normal and breath sounds normal.  Abdominal: She exhibits no distension. There is tenderness. There is guarding.       + rebound RUQ over port soft  Neurological: She is alert and oriented to person, place, and time.  Skin: She is diaphoretic.  Psychiatric: She has a normal mood and affect. Her behavior is normal. Judgment and thought content normal.          Assessment & Plan:

## 2011-12-31 NOTE — Assessment & Plan Note (Signed)
Pain over port Pt has appointment with surgery to see if she is a candidate for gastric sleeve/ gastric bypass.

## 2011-12-31 NOTE — Assessment & Plan Note (Signed)
Related to lap band--- pt has had many problems with port for last 14-15 months.   Pt has appointmt with surger to consider taking it out.

## 2011-12-31 NOTE — Assessment & Plan Note (Signed)
Is better--- this comes and goes

## 2012-01-07 ENCOUNTER — Other Ambulatory Visit: Payer: Self-pay | Admitting: Family Medicine

## 2012-01-10 LAB — HM DIABETES EYE EXAM

## 2012-01-20 ENCOUNTER — Ambulatory Visit (INDEPENDENT_AMBULATORY_CARE_PROVIDER_SITE_OTHER): Payer: Commercial Managed Care - PPO | Admitting: General Surgery

## 2012-01-20 ENCOUNTER — Encounter (INDEPENDENT_AMBULATORY_CARE_PROVIDER_SITE_OTHER): Payer: Self-pay | Admitting: General Surgery

## 2012-01-20 ENCOUNTER — Ambulatory Visit (HOSPITAL_COMMUNITY)
Admission: RE | Admit: 2012-01-20 | Discharge: 2012-01-20 | Disposition: A | Discharge status: Home / Self Care | Payer: 59 | Source: Ambulatory Visit | Attending: General Surgery | Admitting: General Surgery

## 2012-01-20 VITALS — BP 120/70 | HR 84 | Temp 97.3°F | Resp 18 | Ht 71.0 in | Wt 222.0 lb

## 2012-01-20 DIAGNOSIS — K219 Gastro-esophageal reflux disease without esophagitis: Secondary | ICD-10-CM | POA: Insufficient documentation

## 2012-01-20 DIAGNOSIS — Z9884 Bariatric surgery status: Secondary | ICD-10-CM | POA: Insufficient documentation

## 2012-01-20 DIAGNOSIS — K2100 Gastro-esophageal reflux disease with esophagitis, without bleeding: Secondary | ICD-10-CM

## 2012-01-20 DIAGNOSIS — K224 Dyskinesia of esophagus: Secondary | ICD-10-CM | POA: Insufficient documentation

## 2012-01-20 NOTE — Progress Notes (Deleted)
Patient ID: [name], female   DOB: Jan 13, 1956, 56 y.o.   MRN: [id number]  Chief Complaint  Patient presents with  . Weight Loss Surgery    discuss conversion from band to sleeve    HPI [name] is a 56 y.o. female.  *** HPI  Past Medical History  Diagnosis Date  . Allergic rhinitis   . Diabetes mellitus type II   . Headache   . Menopause   . GERD (gastroesophageal reflux disease)   . IBS (irritable bowel syndrome)   . Anxiety   . Depression   . Hypertension   . Hyperlipemia     Past Surgical History  Procedure Date  . Cesarean section 1989 & 1993    X 2  . Cervical laminectomy 2005 & 2009    X 2   NO ROM PROBLEMS  . Foot surgery 2005    RT HEEL  . Right knee 1981    ARTHROSCOPY AND ARTHROTOMY  . Nasal sinus surgery 1995 & 1997    X 2  . Laparoscopic gastric banding 12/30/09  . Esophagogastroduodenoscopy 06/29/2011    Procedure: ESOPHAGOGASTRODUODENOSCOPY (EGD);  Surgeon: Kandis Cocking, MD;  Location: [facility];  Service: General;  Laterality: N/A;  . Hysteroscopy 1999  . Tonsillectomy 1971  . Abdominal hysterectomy 12/1999  . Cholecystectomy 1986  . Tubal ligation 1993    WITH C -SECTION  . Laparoscopy 09/24/2011    Procedure: LAPAROSCOPY DIAGNOSTIC;  Surgeon: Valarie Merino, MD;  Location: [facility];  Service: General;  Laterality: N/A;  . Gastric banding port revision 09/24/2011    Procedure: GASTRIC BANDING PORT REVISION;  Surgeon: Valarie Merino, MD;  Location: [facility];  Service: General;  Laterality: N/A;    Family History  Problem Relation Age of Onset  . Diabetes Mother   . Stroke Mother   . Breast cancer Mother   . Atrial fibrillation Mother   . Hypertension Mother   . Cancer Mother     breast  . Diabetes Father   . Stroke Father   . Hypertension Father   . Heart attack Father   . Thyroid disease    . Heart disease      Social History History  Substance Use Topics  . Smoking status: Never Smoker   . Smokeless tobacco:  Never Used  . Alcohol Use: No    Allergies  Allergen Reactions  . Isoniazid     Severe SOB  . Hctz (Hydrochlorothiazide)     rash  . Promethazine Hcl (Promethazine Hcl)     IF GIVEN  IV-hallucinations     CAN TAKE PO PHENERGAN  . Fentanyl     REACTION: whelps  . Macrolides And Ketolides     rash  . Morphine     REACTION: severe vomiting  . Nitrofurantoin     REACTION: whelps  . Percocet (Oxycodone-Acetaminophen)     REACTION: severe itching  . Sulfamethoxazole W-Trimethoprim     Septra / Bactrim  --REACTION: rash    Current Outpatient Prescriptions  Medication Sig Dispense Refill  . albuterol (PROVENTIL HFA;VENTOLIN HFA) 108 (90 BASE) MCG/ACT inhaler Inhale 1-2 puffs into the lungs every 6 (six) hours as needed for wheezing.  1 Inhaler  0  . BENICAR 20 MG tablet TAKE 1/2 TABLET(10 MG) BY MOUTH DAILY.  30 tablet  5  . citalopram (CELEXA) 20 MG tablet Take 20 mg by mouth every morning.      . cyanocobalamin (,VITAMIN B-12,) 1000  MCG/ML injection Inject 1 mL (1,000 mcg total) into the muscle once a week.  10 mL  0  . cyclobenzaprine (FLEXERIL) 10 MG tablet Take 10 mg by mouth 3 (three) times daily as needed. For muscle spasms      . estradiol (VIVELLE) 0.1 MG/24HR Place 1 patch (0.1 mg total) onto the skin 2 (two) times a week.  8 patch  11  . furosemide (LASIX) 20 MG tablet Take 20 mg by mouth daily as needed. For swelling      . glucose blood (ACCU-CHEK AVIVA) test strip Test 3 times per day as directed  100 each  3  . Lancet Device MISC "avia" check three times a day        . loratadine (CLARITIN) 10 MG tablet Take 10 mg by mouth every morning.       Marland Kitchen omeprazole (PRILOSEC) 20 MG capsule TAKE ONE CAPSULE BY MOUTH 30 MINUTES BEFORE BREAKFAST EVERYDAY  30 capsule  2  . potassium chloride (KLOR-CON M10) 10 MEQ tablet       . rosuvastatin (CRESTOR) 20 MG tablet Take 20 mg by mouth every morning.      Marland Kitchen VICTOZA 18 MG/3ML SOLN INJECT 0.2 MLS (1.2 MG TOTAL) INTO THE SKIN DAILY.  6  mL  5    Review of Systems Review of Systems  Blood pressure 120/70, pulse 84, temperature 97.3 F (36.3 C), temperature source Temporal, resp. rate 18, height 5\' 11"  (1.803 m), weight 222 lb (100.699 kg).  Physical Exam Physical Exam  Data Reviewed ***  Assessment    ***    Plan    ***       [name] DAVID 01/20/2012, 1:16 PM

## 2012-01-20 NOTE — Progress Notes (Signed)
Subjective:     Patient ID: [name], female   DOB: 1955/07/14, 56 y.o.   MRN: [id number]  HPI This patient is seen today for evaluation for possible lap band revision and conversion to vertical sleeve gastrectomy. She had a lab and placed in June of 2011 by Dr. [name] and she was doing very well until approximately September of 2012 following a upper respiratory infection. She lost about 65 pounds until that point but ever since that infection she has had reflux and port site pain. She had an upper GI in November of which per the patient showed reflux and she had an endoscopy in December of 2012 which demonstrated erosions in the esophagus also by patient report. She's had port site pain which began after the upper aspect of infection which led to a port site revision in March of this year and she still has discomfort in the area just above her port although the band is functional. She says that she still has reflux especially when she lies flat at night. She complains of no significant restriction. She says that she is walking 2 miles a day and not snacking.  Review of Systems     Objective:   Physical Exam No distress and nontoxic-appearing her incisions are well-healed without signs of infection feels normal and there is no evidence of any port site problems there is no evidence of any hernia.    Assessment:     Abdominal pain and reflux status post lap and placement I am not really certain that she is a good candidate for revision to a sleeve gastrectomy at this point. I'm not really certain if her band is in good position right now. It sounds like she might even be too tight. She is describing some reflux and loss of restriction and so I recommended an upper GI evaluation. I think that she would also benefit from keeping of food journal and repeat evaluation and consultation with the nutritionist and she is appointment in approximately 2 weeks with a nutritionist we will get upper GI and  I will see her back after her evaluation with a nutritionist. Again, and I'm not sure that she would really qualify at this point for a revision especially since her BMI is only 30. She may qualify for band removal given her port site pain but I'm not sure that following up her band with another restrictive procedure such as a sleeve gastrectomy would really be best for her at this time.    Plan:     We will see her back after her upper GI in consultation with the nutritionist. In the meantime, she'll keep a food journal.

## 2012-01-31 ENCOUNTER — Encounter: Payer: Self-pay | Admitting: *Deleted

## 2012-01-31 ENCOUNTER — Encounter: Payer: 59 | Attending: Surgery | Admitting: *Deleted

## 2012-01-31 DIAGNOSIS — Z9884 Bariatric surgery status: Secondary | ICD-10-CM | POA: Insufficient documentation

## 2012-01-31 DIAGNOSIS — Z713 Dietary counseling and surveillance: Secondary | ICD-10-CM | POA: Insufficient documentation

## 2012-01-31 DIAGNOSIS — Z09 Encounter for follow-up examination after completed treatment for conditions other than malignant neoplasm: Secondary | ICD-10-CM | POA: Insufficient documentation

## 2012-01-31 NOTE — Progress Notes (Signed)
Follow-up visit:  25 Months Post-Operative LAGB Surgery / 4 Months Post-Operative Port Revision  Medical Nutrition Therapy:  Appt start time: 0900  End time:  0945.  Primary concerns today:  Post-operative bariatric surgery nutrition management.  [name] returns s/p port revision (09/24/11) and additional 7-8 lb wt gain. Is extremely frustrated. Reports nausea, port pain, and reflux returned soon after surgery, though food recall shows appropriate choices and portions of foods. Does not feel "restriction" even after 2 recent fills. No consistent meal skipping or high CHO foods reported. Continues to be under extreme stress with family issues which may be having some effect on weight, though I do not believe it to be the main issue. 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   Start weight at [facility]: 262.6 lbs  Weight today: 225.5 lbs Weight change: 7.9 lb gain Total weight lost: 37.1 lbs BMI: 31.5 kg/m^2 Weight goal: 180 lbs % goal met: 45%  TANITA  BODY COMP RESULTS  09/15/11 01/31/12  %Fat 46.4% 46.8%  FM (lbs) 100.5 105.5  FFM (lbs) 116.0 120.0  TBW (lbs) 85.0 88.0   24-hr recall:  B: Protein shake + water S: Yogurt L: 1/2-3/4 of a small [employer] chili w/ cheese S: Protein shake or string cheese D: 1/2 cup spaghetti sauce OR small chili S: Not reported  Fluid intake: water, 1-2 protein shakes = ~60 oz Estimated total protein intake: 60-70g  Medications: No changes Supplementation: Taking regularly  CBG: Checks daily Recent 2hPP BGs: 53, 63 mg/dL Recent pt reported U9W: 6.1% (12/31/11)  Using straws: No Drinking while eating: No Hair loss: No Carbonated beverages: No N/V/D/C: Reflux, nausea have returned Last Lap-Band fill: 10/13/11 & 12/15/11  Recent physical activity:  Walking 30-45 minutes (2-3 times/week)   Progress Towards Goal(s):  In progress.   Nutritional Diagnosis:  [place]-1.4 Altered GI  function As related to nocturnal reflux potentially associated with LAGB.  As evidenced by with regurgitation, hiccups, nausea, and pain before, after, and between eating.    Intervention:  Nutrition education.  Monitoring/Evaluation:  Dietary intake, exercise, lap band fills, and body weight. Follow up in 4 weeks or sooner if needed.

## 2012-02-01 ENCOUNTER — Encounter: Payer: Self-pay | Admitting: *Deleted

## 2012-02-01 NOTE — Patient Instructions (Signed)
Goals:  Follow Phase 3B: High Protein + Non-Starchy Vegetables  Small, frequent meals (pureed or chopped foods - 5-8 small meals/day)  Protein Supplements  Increase lean protein foods to meet 70-80g goal  Increase fluid intake to 64oz +  Avoid drinking 15 minutes before, during and 30 minutes after eating  Aim for >30 min of physical activity daily

## 2012-02-04 ENCOUNTER — Ambulatory Visit (INDEPENDENT_AMBULATORY_CARE_PROVIDER_SITE_OTHER): Payer: Commercial Managed Care - PPO | Admitting: Surgery

## 2012-02-04 ENCOUNTER — Encounter (INDEPENDENT_AMBULATORY_CARE_PROVIDER_SITE_OTHER): Payer: Self-pay | Admitting: Surgery

## 2012-02-04 NOTE — Progress Notes (Signed)
[name] Body mass index is 31.28 kg/(m^2).  Having regurgitation:  yes  Nocturnal reflux?  yes  Amount of fill  - 7 cc  [name] is very frustrated with her course since September.  [name] saw her and discussed sleeve gastrectomy with her although she may not be a good candidate for that. He ordered an upper GI series which are reviewed which shows a normal band placement and the only thing noted was poor peristalsis.  I talked to [name]. We decided that we would take out all the fluid from her band and see how much fluid was there.  We are to see her she does over the weekend and I may reinflate her band next week. It may be that this is [name] CT head is affected her motility which has affected the effectiveness of her band.

## 2012-02-04 NOTE — Patient Instructions (Addendum)

## 2012-02-21 ENCOUNTER — Telehealth: Payer: Self-pay | Admitting: Family Medicine

## 2012-02-21 NOTE — Telephone Encounter (Signed)
.  left message to have patient return my call to schedule apt with MD Laury Axon, message left with family member stating pt unable to come to the phone, will have pt call back later

## 2012-02-21 NOTE — Telephone Encounter (Signed)
Pt states she left a message for a triage nurse to call her back several hours ago. She is sure she has a yeast infection and would like Dr. Laury Axon to call her in some Diflucan to [facility].

## 2012-02-21 NOTE — Telephone Encounter (Signed)
Caller: [name]/Patient; Patient Name: [name]; PCP: Lelon Perla.; Best Callback Phone Number: [phone]. Vaginal irritation. Onset 02/16/12. Has been in a pool a lot since then. No odor. Is "raw and itchy to perineal area." Afebrile. All emergent symptoms per Vaginal Discharge protocol ruled out. Home care advice given. Advised office visit within 24 hours per guidelines. Advised caller to call in AM, 02/22/12 for appointment. Profile page states to only schedule current day appointments.

## 2012-02-21 NOTE — Telephone Encounter (Signed)
She needs ov tomorrow

## 2012-02-23 ENCOUNTER — Ambulatory Visit (INDEPENDENT_AMBULATORY_CARE_PROVIDER_SITE_OTHER): Payer: Commercial Managed Care - PPO | Admitting: Surgery

## 2012-02-23 ENCOUNTER — Encounter (INDEPENDENT_AMBULATORY_CARE_PROVIDER_SITE_OTHER): Payer: Self-pay | Admitting: Surgery

## 2012-02-23 VITALS — BP 126/78 | Temp 97.8°F | Resp 16 | Ht 71.0 in | Wt 224.4 lb

## 2012-02-23 DIAGNOSIS — Z9884 Bariatric surgery status: Secondary | ICD-10-CM

## 2012-02-23 NOTE — Patient Instructions (Signed)

## 2012-02-23 NOTE — Telephone Encounter (Signed)
Called to advise, pt noted to be at surgery apt currently and was unable to schedule with [place] today, however pt notes that her sxs have now resolved and she will call back if she needs further assistance/apt in the near future

## 2012-02-23 NOTE — Progress Notes (Signed)
[name] Body mass index is 31.30 kg/(m^2).  Having regurgitation:  no  Nocturnal reflux?  Not since starting Reglan  Amount of fill  5 cc  Advised to keep Reglan down to 1/night rather and taking 2.  She  Has been sleeping much better.

## 2012-03-01 ENCOUNTER — Ambulatory Visit (INDEPENDENT_AMBULATORY_CARE_PROVIDER_SITE_OTHER): Payer: Commercial Managed Care - PPO | Admitting: Surgery

## 2012-03-01 DIAGNOSIS — Z9884 Bariatric surgery status: Secondary | ICD-10-CM

## 2012-03-01 NOTE — Progress Notes (Signed)
[name] called from work and said she just didn't feel like she was restricted. I told her to come on over the office worker in. I went ahead and accessed her port and found 5 cc in her band. That's what was supposed to have been in there. I went ahead and added another cc so she has 6 cc in her lap band. She was able to drink without difficulty. I told her to keep her previously scheduled appointment.

## 2012-03-01 NOTE — Patient Instructions (Addendum)

## 2012-03-22 ENCOUNTER — Encounter (INDEPENDENT_AMBULATORY_CARE_PROVIDER_SITE_OTHER): Payer: Self-pay | Admitting: Surgery

## 2012-03-22 ENCOUNTER — Ambulatory Visit (INDEPENDENT_AMBULATORY_CARE_PROVIDER_SITE_OTHER): Payer: Commercial Managed Care - PPO | Admitting: Surgery

## 2012-03-22 VITALS — BP 118/84 | Temp 97.8°F | Ht 71.0 in | Wt 225.2 lb

## 2012-03-22 DIAGNOSIS — Z9884 Bariatric surgery status: Secondary | ICD-10-CM

## 2012-03-22 MED ORDER — METOCLOPRAMIDE HCL 5 MG PO TABS: 5.0000 mg | ORAL_TABLET | Freq: Every day | ORAL | Status: DC

## 2012-03-22 NOTE — Patient Instructions (Addendum)

## 2012-03-22 NOTE — Progress Notes (Signed)
[name] Body mass index is 31.41 kg/(m^2).  Having regurgitation:  no  Nocturnal reflux?  no  Amount of fill  1 Wants refill of Reglan 5 mg HS-not having any side effects

## 2012-04-12 ENCOUNTER — Ambulatory Visit (INDEPENDENT_AMBULATORY_CARE_PROVIDER_SITE_OTHER): Payer: Commercial Managed Care - PPO | Admitting: Surgery

## 2012-04-12 ENCOUNTER — Encounter (INDEPENDENT_AMBULATORY_CARE_PROVIDER_SITE_OTHER): Payer: Self-pay | Admitting: Surgery

## 2012-04-12 VITALS — BP 126/82 | HR 75 | Temp 97.0°F | Resp 18 | Ht 71.0 in | Wt 226.2 lb

## 2012-04-12 DIAGNOSIS — Z9884 Bariatric surgery status: Secondary | ICD-10-CM

## 2012-04-12 NOTE — Progress Notes (Signed)
[name] Body mass index is 31.55 kg/(m^2).  Having regurgitation:  no  Nocturnal reflux?  no  Amount of fill  0.5 cc  [name] is going to try Garcinia Cambogia as recommended by Dr. Neil Crouch.  Will see her back in 6 weeks.

## 2012-04-12 NOTE — Patient Instructions (Signed)
1. Stay on liquids for the next 2 days as you adapt to your new fill volume.  Then resume your previous diet. 2. Decreasing your carbohydrate intake will hasten you weight loss.  Rely more on proteins for your meals.  Avoid condiments that contain sweets such as Honey Mustard and sugary salad dressings.   3. Stay in the "green zone".  If you are regurgitating with meals, having night time reflux, and find yourself eating soft comfort foods (mashed potatoes, potato chips)...realize that you are developing "maladaptive eating".  You will not lose weight this way and may regain weight.  The GREEN ZONE is eating smaller portions and not regurgitating.  Hence we may need to withdraw fluid from your band. 4. Build exercise into your daily routine.  Walking is the best way to start but do something every day if you can.    5.   OK to try Garcinia Cambogia but let me know how it makes you feel and we'll assess its impact on your weight loss.

## 2012-05-02 ENCOUNTER — Ambulatory Visit: Payer: 59 | Admitting: *Deleted

## 2012-05-04 ENCOUNTER — Ambulatory Visit (INDEPENDENT_AMBULATORY_CARE_PROVIDER_SITE_OTHER): Payer: Commercial Managed Care - PPO | Admitting: Surgery

## 2012-05-04 ENCOUNTER — Encounter (INDEPENDENT_AMBULATORY_CARE_PROVIDER_SITE_OTHER): Payer: Self-pay | Admitting: Surgery

## 2012-05-04 VITALS — BP 122/74 | HR 74 | Temp 98.3°F | Resp 16 | Ht 71.0 in | Wt 228.2 lb

## 2012-05-04 DIAGNOSIS — Z9884 Bariatric surgery status: Secondary | ICD-10-CM

## 2012-05-04 NOTE — Patient Instructions (Signed)
Will work toward band revision

## 2012-05-04 NOTE — Progress Notes (Signed)
[name] 56 y.o.  Body mass index is 31.83 kg/(m^2).  Patient Active Problem List  Diagnosis  . ONYCHOMYCOSIS, BILATERAL  . DIABETES MELLITUS, TYPE II  . VITAMIN B12 DEFICIENCY  . UNSPECIFIED VITAMIN D DEFICIENCY  . HYPERLIPIDEMIA NEC/NOS  . MORBID OBESITY  . OTHER SPECIFIED ANEMIAS  . ANXIETY  . DEPRESSION  . HYPERTENSION, ESSENTIAL NOS  . ACUTE SINUSITIS, UNSPECIFIED  . ACUTE PHARYNGITIS  . BRONCHITIS, ACUTE  . ALLERGIC RHINITIS  . CELLULITIS  . Pain in Joint, Multiple Sites  . BACK PAIN, LUMBAR  . Unspecified Myalgia and Myositis  . SKIN RASH  . HEADACHE  . COUGH  . NAUSEA  . DIARRHEA  . STRESS INCONTINENCE  . BARIATRIC SURGERY STATUS  . ABDOMINAL PAIN, ACUTE  . 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  Marland Kitchen GERD (gastroesophageal reflux disease)    Allergies  Allergen Reactions  . Isoniazid     Severe SOB  . Hctz (Hydrochlorothiazide)     rash  . Promethazine Hcl (Promethazine Hcl)     IF GIVEN  IV-hallucinations     CAN TAKE PO PHENERGAN  . Fentanyl     REACTION: whelps  . Macrolides And Ketolides     rash  . Morphine     REACTION: severe vomiting  . Nitrofurantoin     REACTION: whelps  . Percocet (Oxycodone-Acetaminophen)     REACTION: severe itching  . Sulfamethoxazole W-Trimethoprim     Septra / Bactrim  --REACTION: rash    Past Surgical History  Procedure Date  . Cesarean section 1989 & 1993    X 2  . Cervical laminectomy 2005 & 2009    X 2   NO ROM PROBLEMS  . Foot surgery 2005    RT HEEL  . Right knee 1981    ARTHROSCOPY AND ARTHROTOMY  . Nasal sinus surgery 1995 & 1997    X 2  . Laparoscopic gastric banding 12/30/09  . Esophagogastroduodenoscopy 06/29/2011    Procedure: ESOPHAGOGASTRODUODENOSCOPY (EGD);  Surgeon: Kandis Cocking, MD;  Location: [facility];  Service: General;  Laterality: N/A;  . Hysteroscopy 1999  . Tonsillectomy 1971  . Abdominal hysterectomy 12/1999  . Cholecystectomy 1986  .  Tubal ligation 1993    WITH C -SECTION  . Laparoscopy 09/24/2011    Procedure: LAPAROSCOPY DIAGNOSTIC;  Surgeon: Valarie Merino, MD;  Location: [facility];  Service: General;  Laterality: N/A;  . Gastric banding port revision 09/24/2011    Procedure: GASTRIC BANDING PORT REVISION;  Surgeon: Valarie Merino, MD;  Location: [facility];  Service: General;  Laterality: N/A;   [name], DO No diagnosis found.  [name] is very frustrated. She currently also has a upper respiratory tract infection. She is had problems with reflux  And her weight loss installed.  She wants to see about a lap band revision. She's been denied for a sleeve and she's done so well with her band. She is frustrated that her weight loss has plateaued and all happened after she had this upper respiratory tract infection last year.  We'll have her discuss possible lab and revision. [name], MD, [facility] [phone] beeper [phone]  05/04/2012 12:55 PM

## 2012-05-05 ENCOUNTER — Encounter: Payer: Self-pay | Admitting: *Deleted

## 2012-05-05 ENCOUNTER — Emergency Department
Admission: EM | Admit: 2012-05-05 | Discharge: 2012-05-05 | Disposition: A | Discharge status: Home / Self Care | Payer: 59 | Source: Home / Self Care

## 2012-05-05 DIAGNOSIS — J069 Acute upper respiratory infection, unspecified: Secondary | ICD-10-CM

## 2012-05-05 DIAGNOSIS — E119 Type 2 diabetes mellitus without complications: Secondary | ICD-10-CM

## 2012-05-05 MED ORDER — PROMETHAZINE-CODEINE 6.25-10 MG/5ML PO SYRP: 5.0000 mL | ORAL_SOLUTION | ORAL | Status: DC | PRN

## 2012-05-05 MED ORDER — METHYLPREDNISOLONE SODIUM SUCC 125 MG IJ SOLR
125.0000 mg | Freq: Once | INTRAMUSCULAR | Status: AC
  Administered 2012-05-05: 125 mg via INTRAMUSCULAR

## 2012-05-05 MED ORDER — DOXYCYCLINE HYCLATE 100 MG PO CAPS: 100.0000 mg | ORAL_CAPSULE | Freq: Two times a day (BID) | ORAL | Status: AC

## 2012-05-05 MED ORDER — PREDNISONE 50 MG PO TABS: ORAL_TABLET | ORAL | Status: DC

## 2012-05-05 NOTE — ED Provider Notes (Signed)
History     CSN: [id number]  Arrival date & time 05/05/12  7829   First MD Initiated Contact with Patient 05/05/12 1827      Chief Complaint  Patient presents with  . Sore Throat  . Cough   HPI URI Symptoms Onset: 4-5 days  Description: nasal congestion, post nasal drip, cough, headache.  Modifying factors:  Baseline diabetic. Last A1C 6.2. Had similar last year that required ER visit for laryngospasm later in course in illness despite treatment.   Symptoms Nasal discharge: post nasal drip Fever: no Sore throat: yes Cough: yes Wheezing: mild Ear pain: no GI symptoms: no Sick contacts: yes; daughter with similar sxs.   Red Flags  Stiff neck: no Dyspnea: no Rash: no Swallowing difficulty: no  Sinusitis Risk Factors Headache/face pain: no Double sickening: no tooth pain: no  Allergy Risk Factors Sneezing: no Itchy scratchy throat: no Seasonal symptoms: no  Flu Risk Factors Headache: n muscle aches: no severe fatigue: mild   Past Medical History  Diagnosis Date  . Allergic rhinitis   . Diabetes mellitus type II   . Headache   . Menopause   . GERD (gastroesophageal reflux disease)   . IBS (irritable bowel syndrome)   . Anxiety   . Depression   . Hypertension   . Hyperlipemia     Past Surgical History  Procedure Date  . Cesarean section 1989 & 1993    X 2  . Cervical laminectomy 2005 & 2009    X 2   NO ROM PROBLEMS  . Foot surgery 2005    RT HEEL  . Right knee 1981    ARTHROSCOPY AND ARTHROTOMY  . Nasal sinus surgery 1995 & 1997    X 2  . Laparoscopic gastric banding 12/30/09  . Esophagogastroduodenoscopy 06/29/2011    Procedure: ESOPHAGOGASTRODUODENOSCOPY (EGD);  Surgeon: Kandis Cocking, MD;  Location: [facility];  Service: General;  Laterality: N/A;  . Hysteroscopy 1999  . Tonsillectomy 1971  . Abdominal hysterectomy 12/1999  . Cholecystectomy 1986  . Tubal ligation 1993    WITH C -SECTION  . Laparoscopy 09/24/2011    Procedure:  LAPAROSCOPY DIAGNOSTIC;  Surgeon: Valarie Merino, MD;  Location: [facility];  Service: General;  Laterality: N/A;  . Gastric banding port revision 09/24/2011    Procedure: GASTRIC BANDING PORT REVISION;  Surgeon: Valarie Merino, MD;  Location: [facility];  Service: General;  Laterality: N/A;    Family History  Problem Relation Age of Onset  . Diabetes Mother   . Stroke Mother   . Breast cancer Mother   . Atrial fibrillation Mother   . Hypertension Mother   . Cancer Mother     breast  . Diabetes Father   . Stroke Father   . Hypertension Father   . Heart attack Father   . Thyroid disease    . Heart disease      History  Substance Use Topics  . Smoking status: Never Smoker   . Smokeless tobacco: Never Used  . Alcohol Use: No    OB History    Grav Para Term Preterm Abortions TAB SAB Ect Mult Living                  Review of Systems  All other systems reviewed and are negative.    Allergies  Isoniazid; Hctz; Promethazine hcl; Fentanyl; Macrolides and ketolides; Morphine; Nitrofurantoin; Percocet; and Sulfamethoxazole w-trimethoprim  Home Medications   Current Outpatient Rx  Name Route Sig  Dispense Refill  . ALBUTEROL SULFATE HFA 108 (90 BASE) MCG/ACT IN AERS Inhalation Inhale 1-2 puffs into the lungs every 6 (six) hours as needed for wheezing. 1 Inhaler 0  . BENICAR 20 MG PO TABS  TAKE 1/2 TABLET(10 MG) BY MOUTH DAILY. 30 tablet 5  . CITALOPRAM HYDROBROMIDE 20 MG PO TABS Oral Take 20 mg by mouth every morning.    Marland Kitchen CYANOCOBALAMIN 1000 MCG/ML IJ SOLN Intramuscular Inject 1 mL (1,000 mcg total) into the muscle once a week. 10 mL 0    For 4 weeks then monthly, then recheck level  . CYCLOBENZAPRINE HCL 10 MG PO TABS Oral Take 10 mg by mouth 3 (three) times daily as needed. For muscle spasms    . ESTRADIOL 0.1 MG/24HR TD PTTW Transdermal Place 1 patch (0.1 mg total) onto the skin 2 (two) times a week. 8 patch 11  . FUROSEMIDE 20 MG PO TABS Oral Take 20 mg by mouth daily as  needed. For swelling    . GLUCOSE BLOOD VI STRP  Test 3 times per day as directed 100 each 3  . LANCET DEVICE MISC  "avia" check three times a day      . LORATADINE 10 MG PO TABS Oral Take 10 mg by mouth every morning.     Marland Kitchen METOCLOPRAMIDE HCL 5 MG PO TABS Oral Take 1 tablet (5 mg total) by mouth at bedtime. 60 tablet 2  . OMEPRAZOLE 20 MG PO CPDR  TAKE ONE CAPSULE BY MOUTH 30 MINUTES BEFORE BREAKFAST EVERYDAY 30 capsule 2  . POTASSIUM CHLORIDE CRYS ER 10 MEQ PO TBCR      . ROSUVASTATIN CALCIUM 20 MG PO TABS Oral Take 20 mg by mouth every morning.    Marland Kitchen VICTOZA 18 MG/3ML [place] SOLN  INJECT 0.2 MLS (1.2 MG TOTAL) INTO THE SKIN DAILY. 6 mL 5    BP 145/80  Pulse 85  Temp 98.2 F (36.8 C) (Oral)  Resp 16  Ht 5\' 11"  (1.803 m)  Wt 228 lb 12 oz (103.76 kg)  BMI 31.90 kg/m2  SpO2 98%  Physical Exam  Constitutional: She appears well-developed and well-nourished.  HENT:  Head: Normocephalic and atraumatic.  Right Ear: External ear normal.  Left Ear: External ear normal.       +nasal erythema, rhinorrhea bilaterally, + post oropharyngeal erythema    Eyes: Conjunctivae normal are normal. Pupils are equal, round, and reactive to light.  Neck: Normal range of motion. Neck supple.  Cardiovascular: Normal rate, regular rhythm and normal heart sounds.   Pulmonary/Chest: Effort normal and breath sounds normal. She has no wheezes.  Abdominal: Soft.  Musculoskeletal: Normal range of motion.  Neurological: She is alert.  Skin: Skin is warm.    ED Course  Procedures (including critical care time)  Labs Reviewed - No data to display No results found.   1. URI (upper respiratory infection)   2. DM (diabetes mellitus)       MDM  Suspect likely viral process as source of sxs.  Lower threshold for treatment given diabetic status and prior history.  Pt states that glucocorticoids have helped in the past.  Solumedrol 125mg  IM x1.  Prednisone x 5 days.  Zpak for atypical coverage.    Discussed resp and infectious red flags at length. 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  Plan to follow up with PCP in next 3-5 days.  Follow up as needed.  The patient and/or caregiver has been counseled thoroughly with regard to treatment plan and/or medications prescribed including dosage, schedule, interactions, rationale for use, and possible side effects and they verbalize understanding. Diagnoses and expected course of recovery discussed and will return if not improved as expected or if the condition worsens. Patient and/or caregiver verbalized understanding.             Doree Albee, MD 05/05/12 2040

## 2012-05-05 NOTE — ED Notes (Signed)
Patient c/o cough, sore throat, headache, and swollen glands since Monday 05/01/12. States daughter had the same thing and she was around her last weekend.

## 2012-05-07 ENCOUNTER — Telehealth: Payer: Self-pay

## 2012-05-07 NOTE — ED Notes (Signed)
Left a message on voice mail asking how patient is feeling and advising to call back with any questions or concerns.  

## 2012-05-07 NOTE — ED Notes (Signed)
[name] complains her blood sugar has been averaging around 200. She stopped the prednisone due to the increase in her blood sugar. She does not have any medication to bring down her blood sugar. The cough is keeping her up at night.

## 2012-05-08 ENCOUNTER — Telehealth: Payer: Self-pay | Admitting: Family Medicine

## 2012-05-08 ENCOUNTER — Encounter: Payer: Self-pay | Admitting: *Deleted

## 2012-05-08 ENCOUNTER — Encounter: Payer: 59 | Attending: Surgery | Admitting: *Deleted

## 2012-05-08 DIAGNOSIS — Z9884 Bariatric surgery status: Secondary | ICD-10-CM | POA: Insufficient documentation

## 2012-05-08 DIAGNOSIS — Z09 Encounter for follow-up examination after completed treatment for conditions other than malignant neoplasm: Secondary | ICD-10-CM | POA: Insufficient documentation

## 2012-05-08 DIAGNOSIS — Z713 Dietary counseling and surveillance: Secondary | ICD-10-CM | POA: Insufficient documentation

## 2012-05-08 NOTE — Telephone Encounter (Signed)
Spoke with patient and she stated her blood sugar is back down to normal, it was 86 about 10 mins ago. And was normal all day Sunday.  She stated she did not take the Prednisone after her blood sugar went up but if she needs to start it she would call to get a sliding scale insulin pen.

## 2012-05-08 NOTE — Telephone Encounter (Signed)
[facility] Triage Call Report Triage Record Num: [id number] Operator: Albertine Grates Patient Name: [name] Call Date & Time: 05/06/2012 9:06:56PM Patient Phone: [phone] PCP: Lelon Perla Patient Gender: Female PCP Fax : [phone] Patient DOB: [birth year] Practice Name: [facility] Reason for Call: Caller: [name]; PCP: Lelon Perla.; CB#: [phone]; States blood sugar is 212 when taken 10-26. Was seen in urgent care 10-25 and given Depo-Medrol injection. Denies emergent symptoms. Care advice given per Diabetes Control problems. Protocol(s) Used: Diabetes: Control Problems Recommended Outcome per Protocol: See Provider within 24 hours Reason for Outcome: All other situations Care Advice: Go to ED immediately if blood sugar is 300 mg/dl or more AND symptoms are present including: large urine ketones, rapid, deep breathing, fruity breath, nausea, vomiting or abdominal pain; confused thinking, dry, flushed skin; extreme thirst, extreme fatigue or weakness; or frequent urination. ~ 05/06/2012 9:15:05PM Page 1 of 1 [email or web address]

## 2012-05-08 NOTE — Telephone Encounter (Signed)
Pt needs to be seen --- she may need insulin ---- does someone have opening today

## 2012-05-08 NOTE — Progress Notes (Signed)
Follow-up visit:  25 Months Post-Operative LAGB Surgery / 7 Months Post-Operative Port Revision  Medical Nutrition Therapy:  Appt start time: 0800  End time:  0830.  Primary concerns today:  Post-operative bariatric surgery nutrition management.  [name] returns for f/u after port revision and reports fills of 2.5 cc over the last 2 months with absolutely no restriction. Has gained additional 8.5 lbs since last visit. Stress level has been extremely high last 6 months d/t work. Reports she was told she has hiatal hernia and reports episodes of nausea off and on. States she is in the process of getting approval for a total LAGB revision of band and port.  Will f/u here 2 weeks after surgery.  Start weight at [facility]: 262.6 lbs  Weight today: 234.0 lbs Weight change: 8.5 lb GAIN Total weight lost: 28.6 lbs BMI: 32.6 kg/m^2  Weight goal: 180 lbs % goal met: 35%  TANITA  BODY COMP RESULTS  09/15/11 01/31/12 05/08/12  %Fat 46.4% 46.8% 47.5  FM (lbs) 100.5 105.5 111.0  FFM (lbs) 116.0 120.0 123.0  TBW (lbs) 85.0 88.0 90.0   Fluid intake: water, 1-2 protein shakes = ~60 oz Estimated total protein intake: 60-70g  Medications: No changes Supplementation: Taking regularly  Lab Results  Component Value Date   HGBA1C 6.1 12/31/2011   Using straws: No Drinking while eating: No Hair loss: No Carbonated beverages: No N/V/D/C: Reflux, nausea have returned  Last Lap-Band fill: 8/14 - 5 cc; 8/21 - 1 cc; 9/11 - 1 cc; 10/2 - 0.5 cc = Pt states she still feels no restriction at all and continues to gain weight  Recent physical activity:  Walking 30-45 minutes (2-3 times/week)   Progress Towards Goal(s):  In progress.   Nutritional Diagnosis:  [place]-1.4 Altered GI function As related to nocturnal reflux potentially associated with LAGB.  As evidenced by with regurgitation, hiccups, nausea, and pain before, after, and between eating.    Intervention:  Nutrition education.  Handouts given during  class include:  Pre-Op Bariatric Surgery Diet Handout  Post-Op LAGB Bariatric Surgery Nutrition Handout  Adjustable Gastric Banding Patient Handbook  Follow-Up Plan: Patient will follow-up at [facility] 2 weeks post operatively for diet advancement per MD.

## 2012-05-08 NOTE — Patient Instructions (Addendum)
Follow:   Pre-Op Diet per MD 2 weeks prior to surgery  Phase 2- Liquids (clear/full) 2 weeks after surgery  Vitamin/Mineral/Calcium guidelines for purchasing bariatric supplements  Exercise guidelines pre and post-op per MD  Follow-up at [facility] in 2 weeks post-op for diet advancement. Contact Kavian Peters as needed with questions/concerns. 

## 2012-05-08 NOTE — Telephone Encounter (Signed)
noted 

## 2012-05-09 ENCOUNTER — Other Ambulatory Visit: Payer: Self-pay | Admitting: Family Medicine

## 2012-05-09 ENCOUNTER — Other Ambulatory Visit: Payer: Self-pay | Admitting: Obstetrics and Gynecology

## 2012-05-09 DIAGNOSIS — Z1231 Encounter for screening mammogram for malignant neoplasm of breast: Secondary | ICD-10-CM

## 2012-05-10 ENCOUNTER — Ambulatory Visit (INDEPENDENT_AMBULATORY_CARE_PROVIDER_SITE_OTHER): Payer: 59 | Admitting: Family Medicine

## 2012-05-10 DIAGNOSIS — E119 Type 2 diabetes mellitus without complications: Secondary | ICD-10-CM

## 2012-05-10 NOTE — Progress Notes (Signed)
Patient presents today for 67-month DM follow-up as part of [facility] program. Medications, diet, exercise, blood glucoses and diabetic regimens have been reviewed. Details of this visit can be found in Consulting civil engineer (available through [employer]). She will follow-up with me in 3 months for further review of her DM.

## 2012-05-11 NOTE — Progress Notes (Signed)
  Subjective:    Patient ID: [name], female    DOB: [birth year], 56 y.o.   MRN: [id number]  HPI Reviewed and agree with documentation and management.    Review of Systems     Objective:   Physical Exam        Assessment & Plan:

## 2012-05-26 ENCOUNTER — Ambulatory Visit (HOSPITAL_BASED_OUTPATIENT_CLINIC_OR_DEPARTMENT_OTHER)
Admission: RE | Admit: 2012-05-26 | Discharge: 2012-05-26 | Disposition: A | Discharge status: Home / Self Care | Payer: 59 | Source: Ambulatory Visit | Attending: Obstetrics and Gynecology | Admitting: Obstetrics and Gynecology

## 2012-05-26 DIAGNOSIS — Z1231 Encounter for screening mammogram for malignant neoplasm of breast: Secondary | ICD-10-CM | POA: Insufficient documentation

## 2012-05-31 ENCOUNTER — Encounter (INDEPENDENT_AMBULATORY_CARE_PROVIDER_SITE_OTHER): Payer: Commercial Managed Care - PPO | Admitting: Surgery

## 2012-06-09 ENCOUNTER — Other Ambulatory Visit: Payer: Self-pay | Admitting: Family Medicine

## 2012-06-14 ENCOUNTER — Other Ambulatory Visit: Payer: Self-pay | Admitting: Family Medicine

## 2012-06-15 NOTE — Telephone Encounter (Signed)
Rx sent.    MW 

## 2012-06-16 ENCOUNTER — Encounter (HOSPITAL_COMMUNITY): Payer: Self-pay | Admitting: Pharmacy Technician

## 2012-06-16 ENCOUNTER — Other Ambulatory Visit (HOSPITAL_COMMUNITY): Payer: Self-pay | Admitting: Surgery

## 2012-06-16 ENCOUNTER — Encounter (HOSPITAL_COMMUNITY): Payer: Self-pay | Admitting: *Deleted

## 2012-06-22 ENCOUNTER — Encounter (HOSPITAL_COMMUNITY): Payer: Self-pay

## 2012-06-22 ENCOUNTER — Ambulatory Visit (HOSPITAL_COMMUNITY)
Admission: RE | Admit: 2012-06-22 | Discharge: 2012-06-22 | Disposition: A | Discharge status: Home / Self Care | Payer: 59 | Source: Ambulatory Visit | Attending: Surgery | Admitting: Surgery

## 2012-06-22 ENCOUNTER — Encounter (HOSPITAL_COMMUNITY)
Admission: RE | Admit: 2012-06-22 | Discharge: 2012-06-22 | Disposition: A | Discharge status: Home / Self Care | Payer: 59 | Source: Ambulatory Visit | Attending: Surgery | Admitting: Surgery

## 2012-06-22 DIAGNOSIS — Z9089 Acquired absence of other organs: Secondary | ICD-10-CM | POA: Insufficient documentation

## 2012-06-22 DIAGNOSIS — Z01818 Encounter for other preprocedural examination: Secondary | ICD-10-CM | POA: Insufficient documentation

## 2012-06-22 DIAGNOSIS — Z01812 Encounter for preprocedural laboratory examination: Secondary | ICD-10-CM | POA: Insufficient documentation

## 2012-06-22 LAB — BASIC METABOLIC PANEL
BUN: 12 mg/dL (ref 6–23)
CO2: 27 mEq/L (ref 19–32)
Calcium: 9.6 mg/dL (ref 8.4–10.5)
Chloride: 101 mEq/L (ref 96–112)
Creatinine, Ser: 0.89 mg/dL (ref 0.50–1.10)
GFR calc Af Amer: 83 mL/min — ABNORMAL LOW (ref >90)
GFR calc non Af Amer: 72 mL/min — ABNORMAL LOW (ref >90)
Glucose, Bld: 103 mg/dL — ABNORMAL HIGH (ref 70–99)
Potassium: 4 mEq/L (ref 3.5–5.1)
Sodium: 138 mEq/L (ref 135–145)

## 2012-06-22 LAB — CBC
HCT: 39 % (ref 36.0–46.0)
Hemoglobin: 12.8 g/dL (ref 12.0–15.0)
MCH: 26.9 pg (ref 26.0–34.0)
MCHC: 32.8 g/dL (ref 30.0–36.0)
MCV: 81.9 fL (ref 78.0–100.0)
Platelets: 290 10*3/uL (ref 150–400)
RBC: 4.76 MIL/uL (ref 3.87–5.11)
RDW: 13.6 % (ref 11.5–15.5)
WBC: 9 10*3/uL (ref 4.0–10.5)

## 2012-06-22 LAB — SURGICAL PCR SCREEN
MRSA, PCR: NEGATIVE
Staphylococcus aureus: NEGATIVE

## 2012-06-22 NOTE — Progress Notes (Signed)
09-22-2011 ekg epic

## 2012-06-22 NOTE — Patient Instructions (Addendum)
20 [name]  06/22/2012   Your procedure is scheduled on:  07-03-2012  Report to [facility] a 0800  AM.  Call this number if you have problems the morning of surgery: [phone]   Remember: fleets enema night before surgery.   Do not eat food or drink liquids:After Midnight.  .  Take these medicines the morning of surgery with A SIP OF WATER: albuterol inhaler if needed and bring inhaler   Do not wear jewelry or make up.  Do not wear lotions, powders, or perfumes. You may wear deodorant.    Do not bring valuables to the hospital.  Contacts, dentures or bridgework may not be worn into surgery.  Leave suitcase in the car. After surgery it may be brought to your room.  For patients admitted to the hospital, checkout time is 11:00 AM the day of      discharge                                    Patients discharged the day of surgery will not be allowed to drive home. If going   home same day of surgery, you must have someone stay with you the first 24   hours at home and arrange for some one to drive you home from hospital.    Special Instructions: See [facility] Preparing for Surgery instruction sheet.   Women do not shave legs or underarms for 12 hours before showers.   Men may shave face morning of surgery.      Please read over the following fact sheets that you were given: MRSA   Information               Cain Sieve [facility] pre op nurse phone number [phone], call if needed                  FAILURE TO FOLLOW THESE INSTRUCTIONS AMY RESULT IN    CANCELLATION OF YOUR SURGERY.                  Patient Signature :___________________________________

## 2012-06-27 ENCOUNTER — Ambulatory Visit (INDEPENDENT_AMBULATORY_CARE_PROVIDER_SITE_OTHER): Payer: 59 | Admitting: Obstetrics and Gynecology

## 2012-06-27 ENCOUNTER — Encounter: Payer: Self-pay | Admitting: Obstetrics and Gynecology

## 2012-06-27 VITALS — BP 120/70 | Resp 16 | Wt 228.0 lb

## 2012-06-27 DIAGNOSIS — Z01419 Encounter for gynecological examination (general) (routine) without abnormal findings: Secondary | ICD-10-CM

## 2012-06-27 MED ORDER — ESTRADIOL 10 MCG VA TABS: 10.0000 ug | ORAL_TABLET | VAGINAL | Status: DC

## 2012-06-27 MED ORDER — ESTRADIOL 0.1 MG/24HR TD PTTW: 1.0000 | MEDICATED_PATCH | TRANSDERMAL | Status: DC

## 2012-06-27 NOTE — Progress Notes (Signed)
Subjective:    [name] is a 56 y.o. female [phone] who presents for annual exam. The patient complaints of stress with her daughter and with her job. She is status post hysterectomy. She is currently taking Vivelle Dot and Vagifem.  She is scheduled for repeat lap band surgery.  She has multiple medical problems.  The following portions of the patient's history were reviewed and updated as appropriate: allergies, current medications, past family history, past medical history, past social history, past surgical history and problem list.  Review of Systems Pertinent items are noted in HPI. Gastrointestinal:No change in bowel habits, no abdominal pain, no rectal bleeding Genitourinary:negative for dysuria, frequency, hematuria, nocturia and urinary incontinence    Objective:     BP 120/70  Resp 16  Wt 228 lb (103.42 kg)  Weight:  Wt Readings from Last 1 Encounters:  06/27/12 228 lb (103.42 kg)     BMI: There is no height on file to calculate BMI. General Appearance: Alert, appropriate appearance for age. No acute distress HEENT: Grossly normal Neck / Thyroid: Supple, no masses, nodes or enlargement Lungs: clear to auscultation bilaterally Back: No CVA tenderness Breast Exam: No masses or nodes.No dimpling, nipple retraction or discharge. Cardiovascular: Regular rate and rhythm. S1, S2, no murmur Gastrointestinal: Soft, non-tender, no organomegaly, possible hernia in the midline  ++++++++++++++++++++++++++++++++++++++++++++++++++++++++  Pelvic Exam: External genitalia: normal general appearance Vaginal: atrophic mucosa Cervix: absent Adnexa: normal bimanual exam Uterus: absent Rectovaginal: normal rectal, no masses  ++++++++++++++++++++++++++++++++++++++++++++++++++++++++  Lymphatic Exam: Non-palpable nodes in neck, clavicular, axillary, or inguinal regions  Psychiatric: Alert and oriented, appropriate affect.      Assessment:    Menopause   Overweight or obese:  Yes  Pelvic relaxation: Yes  Menopausal symptoms: Yes. Severe: Yes.  Multiple stresses   Plan:    stress discussed   Follow-up:  for annual exam  Vivelle Dot 0.1 mg twice each week  Vagifem 10 g twice each week  The updated Pap smear screening guidelines were discussed with the patient. The patient requested that I obtain a Pap smear: No.  Kegel exercises discussed: Yes.  Proper diet and regular exercise were reviewed.  Annual mammograms recommended starting at age 19. Proper breast care was discussed.  Screening colonoscopy is recommended beginning at age 88.  Regular health maintenance was reviewed.  Sleep hygiene was discussed.  Adequate calcium and vitamin D intake was emphasized.  Leonard Schwartz M.D.   Regular Periods: no Mammogram: yes 05/26/12  Monthly Breast Ex.: yes Exercise: yes  Tetanus < 10 years: yes Seatbelts: yes  NI. Bladder Functn.: yes Abuse at home: no  Daily BM's: yes Stressful Work: yes  Healthy Diet: yes Sigmoid-Colonoscopy: 2008 due 2018  Calcium: yes Medical problems this year: pt needs Vagifem at the doctor's discretion and wants to talk to AVS regarding repeat lap band surgery scheduled this Mon   LAST PAP:07/07/11 WNL  Contraception: hyst  Mammogram:  05/26/12  PCP: Dr. [name]   PMH: Pt states due to severe respiratory infection last year, she may have cracked her band or may have a hernia near it. She is scheduled for surgery on Monday  [facility]: no change   Last Bone Scan:  2005 WNL

## 2012-06-30 ENCOUNTER — Encounter (HOSPITAL_COMMUNITY): Payer: Self-pay | Admitting: *Deleted

## 2012-06-30 ENCOUNTER — Other Ambulatory Visit (HOSPITAL_COMMUNITY): Payer: Self-pay | Admitting: Surgery

## 2012-07-03 ENCOUNTER — Encounter (HOSPITAL_COMMUNITY): Payer: Self-pay | Admitting: Anesthesiology

## 2012-07-03 ENCOUNTER — Ambulatory Visit (HOSPITAL_COMMUNITY): Payer: 59 | Admitting: Anesthesiology

## 2012-07-03 ENCOUNTER — Encounter (HOSPITAL_COMMUNITY): Payer: Self-pay | Admitting: *Deleted

## 2012-07-03 ENCOUNTER — Observation Stay (HOSPITAL_COMMUNITY)
Admission: RE | Admit: 2012-07-03 | Discharge: 2012-07-04 | Disposition: A | Discharge status: Home / Self Care | Payer: 59 | Source: Ambulatory Visit | Attending: Surgery | Admitting: Surgery

## 2012-07-03 ENCOUNTER — Encounter (HOSPITAL_COMMUNITY)
Admission: RE | Disposition: A | Discharge status: Home / Self Care | Payer: Self-pay | Source: Ambulatory Visit | Attending: Surgery

## 2012-07-03 DIAGNOSIS — Y831 Surgical operation with implant of artificial internal device as the cause of abnormal reaction of the patient, or of later complication, without mention of misadventure at the time of the procedure: Secondary | ICD-10-CM | POA: Insufficient documentation

## 2012-07-03 DIAGNOSIS — E119 Type 2 diabetes mellitus without complications: Secondary | ICD-10-CM | POA: Insufficient documentation

## 2012-07-03 DIAGNOSIS — Z6831 Body mass index (BMI) 31.0-31.9, adult: Secondary | ICD-10-CM | POA: Insufficient documentation

## 2012-07-03 DIAGNOSIS — K9509 Other complications of gastric band procedure: Principal | ICD-10-CM | POA: Insufficient documentation

## 2012-07-03 DIAGNOSIS — I10 Essential (primary) hypertension: Secondary | ICD-10-CM | POA: Insufficient documentation

## 2012-07-03 DIAGNOSIS — E785 Hyperlipidemia, unspecified: Secondary | ICD-10-CM | POA: Insufficient documentation

## 2012-07-03 DIAGNOSIS — K219 Gastro-esophageal reflux disease without esophagitis: Secondary | ICD-10-CM | POA: Insufficient documentation

## 2012-07-03 HISTORY — PX: LAPAROSCOPIC REVISION OF GASTRIC BAND: SHX5921

## 2012-07-03 HISTORY — DX: Unspecified asthma, uncomplicated: J45.909

## 2012-07-03 HISTORY — PX: MESH APPLIED TO LAP PORT: SHX5969

## 2012-07-03 HISTORY — DX: Stress incontinence (female) (male): N39.3

## 2012-07-03 HISTORY — PX: LAPAROSCOPY: SHX197

## 2012-07-03 LAB — GLUCOSE, CAPILLARY
Glucose-Capillary: 115 mg/dL — ABNORMAL HIGH (ref 70–99)
Glucose-Capillary: 165 mg/dL — ABNORMAL HIGH (ref 70–99)

## 2012-07-03 LAB — CREATININE, SERUM
Creatinine, Ser: 1.06 mg/dL (ref 0.50–1.10)
GFR calc Af Amer: 67 mL/min — ABNORMAL LOW (ref >90)
GFR calc non Af Amer: 58 mL/min — ABNORMAL LOW (ref >90)

## 2012-07-03 LAB — CBC
HCT: 38.5 % (ref 36.0–46.0)
Hemoglobin: 12.6 g/dL (ref 12.0–15.0)
MCH: 27.2 pg (ref 26.0–34.0)
MCHC: 32.7 g/dL (ref 30.0–36.0)
MCV: 83 fL (ref 78.0–100.0)
Platelets: 248 10*3/uL (ref 150–400)
RBC: 4.64 MIL/uL (ref 3.87–5.11)
RDW: 13.5 % (ref 11.5–15.5)
WBC: 13.1 10*3/uL — ABNORMAL HIGH (ref 4.0–10.5)

## 2012-07-03 SURGERY — LAPAROSCOPY, DIAGNOSTIC
Anesthesia: General | Site: Abdomen | Wound class: Clean

## 2012-07-03 MED ORDER — SUFENTANIL CITRATE 50 MCG/ML IV SOLN
INTRAVENOUS | Status: DC | PRN
  Administered 2012-07-03 (×4): 5 ug via INTRAVENOUS
  Administered 2012-07-03: 20 ug via INTRAVENOUS

## 2012-07-03 MED ORDER — CISATRACURIUM BESYLATE (PF) 10 MG/5ML IV SOLN
INTRAVENOUS | Status: DC | PRN
  Administered 2012-07-03: 6 mg via INTRAVENOUS
  Administered 2012-07-03 (×3): 2 mg via INTRAVENOUS

## 2012-07-03 MED ORDER — PROPOFOL 10 MG/ML IV BOLUS
INTRAVENOUS | Status: DC | PRN
  Administered 2012-07-03: 200 mg via INTRAVENOUS

## 2012-07-03 MED ORDER — LACTATED RINGERS IR SOLN
Status: DC | PRN
  Administered 2012-07-03: 1000 mL

## 2012-07-03 MED ORDER — UNJURY CHOCOLATE CLASSIC POWDER: 2.0000 [oz_av] | Freq: Four times a day (QID) | ORAL | Status: DC

## 2012-07-03 MED ORDER — GLYCOPYRROLATE 0.2 MG/ML IJ SOLN
INTRAMUSCULAR | Status: DC | PRN
  Administered 2012-07-03: .6 mg via INTRAVENOUS

## 2012-07-03 MED ORDER — ACETAMINOPHEN 10 MG/ML IV SOLN: 1000.0000 mg | Freq: Once | INTRAVENOUS | Status: DC | PRN

## 2012-07-03 MED ORDER — ONDANSETRON HCL 4 MG/2ML IJ SOLN
INTRAMUSCULAR | Status: DC | PRN
  Administered 2012-07-03: 4 mg via INTRAVENOUS

## 2012-07-03 MED ORDER — METOCLOPRAMIDE HCL 5 MG/ML IJ SOLN
INTRAMUSCULAR | Status: DC | PRN
  Administered 2012-07-03: 10 mg via INTRAVENOUS

## 2012-07-03 MED ORDER — HEPARIN SODIUM (PORCINE) 5000 UNIT/ML IJ SOLN
5000.0000 [IU] | Freq: Three times a day (TID) | INTRAMUSCULAR | Status: DC
  Administered 2012-07-03 – 2012-07-04 (×2): 5000 [IU] via SUBCUTANEOUS
  Filled 2012-07-03 (×5): qty 1

## 2012-07-03 MED ORDER — HEPARIN SODIUM (PORCINE) 5000 UNIT/ML IJ SOLN
5000.0000 [IU] | Freq: Once | INTRAMUSCULAR | Status: AC
  Administered 2012-07-03: 5000 [IU] via SUBCUTANEOUS
  Filled 2012-07-03: qty 1

## 2012-07-03 MED ORDER — LIDOCAINE HCL (CARDIAC) 20 MG/ML IV SOLN
INTRAVENOUS | Status: DC | PRN
  Administered 2012-07-03: 100 mg via INTRAVENOUS

## 2012-07-03 MED ORDER — KCL IN DEXTROSE-NACL 20-5-0.45 MEQ/L-%-% IV SOLN
INTRAVENOUS | Status: AC
  Filled 2012-07-03: qty 1000

## 2012-07-03 MED ORDER — ACETAMINOPHEN 10 MG/ML IV SOLN
INTRAVENOUS | Status: AC
  Filled 2012-07-03: qty 100

## 2012-07-03 MED ORDER — UNJURY CHICKEN SOUP POWDER: 2.0000 [oz_av] | Freq: Four times a day (QID) | ORAL | Status: DC

## 2012-07-03 MED ORDER — CEFAZOLIN SODIUM-DEXTROSE 2-3 GM-% IV SOLR
2.0000 g | INTRAVENOUS | Status: AC
  Administered 2012-07-03: 2 g via INTRAVENOUS

## 2012-07-03 MED ORDER — EPHEDRINE SULFATE 50 MG/ML IJ SOLN
INTRAMUSCULAR | Status: DC | PRN
  Administered 2012-07-03: 5 mg via INTRAVENOUS
  Administered 2012-07-03 (×2): 10 mg via INTRAVENOUS

## 2012-07-03 MED ORDER — ACETAMINOPHEN 10 MG/ML IV SOLN
INTRAVENOUS | Status: DC | PRN
  Administered 2012-07-03: 1000 mg via INTRAVENOUS

## 2012-07-03 MED ORDER — HYDROMORPHONE HCL PF 1 MG/ML IJ SOLN: 0.2500 mg | INTRAMUSCULAR | Status: DC | PRN

## 2012-07-03 MED ORDER — BUPIVACAINE LIPOSOME 1.3 % IJ SUSP
20.0000 mL | Freq: Once | INTRAMUSCULAR | Status: AC
  Administered 2012-07-03: 20 mL
  Filled 2012-07-03: qty 20

## 2012-07-03 MED ORDER — DEXAMETHASONE SODIUM PHOSPHATE 10 MG/ML IJ SOLN
INTRAMUSCULAR | Status: DC | PRN
  Administered 2012-07-03: 10 mg via INTRAVENOUS

## 2012-07-03 MED ORDER — SUCCINYLCHOLINE CHLORIDE 20 MG/ML IJ SOLN
INTRAMUSCULAR | Status: DC | PRN
  Administered 2012-07-03: 100 mg via INTRAVENOUS

## 2012-07-03 MED ORDER — SODIUM CHLORIDE 0.9 % IJ SOLN
INTRAMUSCULAR | Status: DC | PRN
  Administered 2012-07-03: 20 mL via INTRAVENOUS
  Administered 2012-07-03: 10 mL

## 2012-07-03 MED ORDER — HYDROMORPHONE HCL PF 1 MG/ML IJ SOLN
0.5000 mg | INTRAMUSCULAR | Status: DC | PRN
  Administered 2012-07-03: 0.5 mg via INTRAVENOUS
  Filled 2012-07-03: qty 1

## 2012-07-03 MED ORDER — 0.9 % SODIUM CHLORIDE (POUR BTL) OPTIME
TOPICAL | Status: DC | PRN
  Administered 2012-07-03: 1000 mL

## 2012-07-03 MED ORDER — UNJURY VANILLA POWDER: 2.0000 [oz_av] | Freq: Four times a day (QID) | ORAL | Status: DC

## 2012-07-03 MED ORDER — ACETAMINOPHEN 160 MG/5ML PO SOLN: 650.0000 mg | ORAL | Status: DC | PRN

## 2012-07-03 MED ORDER — CEFAZOLIN SODIUM-DEXTROSE 2-3 GM-% IV SOLR
INTRAVENOUS | Status: AC
  Filled 2012-07-03: qty 50

## 2012-07-03 MED ORDER — NEOSTIGMINE METHYLSULFATE 1 MG/ML IJ SOLN
INTRAMUSCULAR | Status: DC | PRN
  Administered 2012-07-03: 4 mg via INTRAVENOUS

## 2012-07-03 MED ORDER — KCL IN DEXTROSE-NACL 20-5-0.45 MEQ/L-%-% IV SOLN
INTRAVENOUS | Status: DC
  Administered 2012-07-03: 14:00:00 via INTRAVENOUS
  Filled 2012-07-03 (×2): qty 1000

## 2012-07-03 MED ORDER — LACTATED RINGERS IV SOLN
INTRAVENOUS | Status: DC
  Administered 2012-07-03: 13:00:00 via INTRAVENOUS
  Administered 2012-07-03: 1000 mL via INTRAVENOUS
  Administered 2012-07-03: 11:00:00 via INTRAVENOUS

## 2012-07-03 MED ORDER — ALBUTEROL SULFATE HFA 108 (90 BASE) MCG/ACT IN AERS
1.0000 | INHALATION_SPRAY | Freq: Four times a day (QID) | RESPIRATORY_TRACT | Status: DC | PRN
  Filled 2012-07-03: qty 6.7

## 2012-07-03 MED ORDER — MIDAZOLAM HCL 5 MG/5ML IJ SOLN
INTRAMUSCULAR | Status: DC | PRN
  Administered 2012-07-03: 2 mg via INTRAVENOUS

## 2012-07-03 MED ORDER — OXYCODONE-ACETAMINOPHEN 5-325 MG/5ML PO SOLN: 5.0000 mL | ORAL | Status: DC | PRN

## 2012-07-03 MED ORDER — ONDANSETRON HCL 4 MG/2ML IJ SOLN: 4.0000 mg | INTRAMUSCULAR | Status: DC | PRN

## 2012-07-03 SURGICAL SUPPLY — 69 items
APL SKNCLS STERI-STRIP NONHPOA (GAUZE/BANDAGES/DRESSINGS)
BAND LAP 10.0 W/TUBES (Band) ×3 IMPLANT
BENZOIN TINCTURE PRP APPL 2/3 (GAUZE/BANDAGES/DRESSINGS) IMPLANT
BLADE HEX COATED 2.75 (ELECTRODE) ×3 IMPLANT
BLADE SURG 15 STRL LF DISP TIS (BLADE) ×2 IMPLANT
BLADE SURG 15 STRL SS (BLADE) ×2
CANISTER SUCTION 2500CC (MISCELLANEOUS) ×3 IMPLANT
CLOTH BEACON ORANGE TIMEOUT ST (SAFETY) ×3 IMPLANT
COVER SURGICAL LIGHT HANDLE (MISCELLANEOUS) IMPLANT
DECANTER SPIKE VIAL GLASS SM (MISCELLANEOUS) ×3 IMPLANT
DERMABOND ADVANCED (GAUZE/BANDAGES/DRESSINGS) ×1
DERMABOND ADVANCED .7 DNX12 (GAUZE/BANDAGES/DRESSINGS) ×2 IMPLANT
DEVICE SUT QUICK LOAD TK 5 (STAPLE) ×9 IMPLANT
DEVICE SUT TI-KNOT TK 5X26 (MISCELLANEOUS) ×3 IMPLANT
DEVICE SUTURE ENDOST 10MM (ENDOMECHANICALS) IMPLANT
DEVICE TROCAR PUNCTURE CLOSURE (ENDOMECHANICALS) ×3 IMPLANT
DISSECTOR BLUNT TIP ENDO 5MM (MISCELLANEOUS) ×3 IMPLANT
DRAPE CAMERA CLOSED 9X96 (DRAPES) ×3 IMPLANT
DRAPE LAPAROSCOPIC ABDOMINAL (DRAPES) ×3 IMPLANT
ELECT REM PT RETURN 9FT ADLT (ELECTROSURGICAL) ×3
ELECTRODE REM PT RTRN 9FT ADLT (ELECTROSURGICAL) ×2 IMPLANT
GLOVE BIOGEL M 8.0 STRL (GLOVE) ×3 IMPLANT
GLOVE BIOGEL PI IND STRL 7.0 (GLOVE) ×2 IMPLANT
GLOVE BIOGEL PI INDICATOR 7.0 (GLOVE) ×1
GOWN STRL NON-REIN LRG LVL3 (GOWN DISPOSABLE) IMPLANT
GOWN STRL REIN XL XLG (GOWN DISPOSABLE) ×15 IMPLANT
HAND ACTIVATED (MISCELLANEOUS) IMPLANT
HOVERMATT SINGLE USE (MISCELLANEOUS) ×3 IMPLANT
KIT BASIN OR (CUSTOM PROCEDURE TRAY) ×3 IMPLANT
MESH HERNIA 1X4 RECT BARD (Mesh General) ×2 IMPLANT
MESH HERNIA BARD 1X4 (Mesh General) ×1 IMPLANT
NEEDLE SPNL 22GX3.5 QUINCKE BK (NEEDLE) ×3 IMPLANT
NS IRRIG 1000ML POUR BTL (IV SOLUTION) ×3 IMPLANT
PACK UNIVERSAL I (CUSTOM PROCEDURE TRAY) ×3 IMPLANT
PENCIL BUTTON HOLSTER BLD 10FT (ELECTRODE) ×3 IMPLANT
SCALPEL HARMONIC ACE (MISCELLANEOUS) IMPLANT
SET IRRIG TUBING LAPAROSCOPIC (IRRIGATION / IRRIGATOR) ×3 IMPLANT
SHEARS CURVED HARMONIC AC 45CM (MISCELLANEOUS) IMPLANT
SLEEVE ADV FIXATION 5X100MM (TROCAR) ×3 IMPLANT
SOLUTION ANTI FOG 6CC (MISCELLANEOUS) ×3 IMPLANT
SPONGE GAUZE 4X4 12PLY (GAUZE/BANDAGES/DRESSINGS) IMPLANT
SPONGE LAP 18X18 X RAY DECT (DISPOSABLE) ×3 IMPLANT
STAPLER VISISTAT 35W (STAPLE) ×3 IMPLANT
STRIP CLOSURE SKIN 1/2X4 (GAUZE/BANDAGES/DRESSINGS) IMPLANT
SUT ETHIBOND 2 0 SH (SUTURE) ×3
SUT ETHIBOND 2 0 SH 36X2 (SUTURE) ×6 IMPLANT
SUT PROLENE 2 0 CT2 30 (SUTURE) ×3 IMPLANT
SUT SILK 0 (SUTURE) ×2
SUT SILK 0 30XBRD TIE 6 (SUTURE) ×2 IMPLANT
SUT SURGIDAC NAB ES-9 0 48 120 (SUTURE) IMPLANT
SUT VIC AB 2-0 SH 27 (SUTURE) ×1
SUT VIC AB 2-0 SH 27X BRD (SUTURE) ×2 IMPLANT
SUT VIC AB 4-0 SH 18 (SUTURE) ×3 IMPLANT
SUT VICRYL 0 UR6 27IN ABS (SUTURE) ×3 IMPLANT
SYR 20CC LL (SYRINGE) ×3 IMPLANT
SYR 30ML LL (SYRINGE) ×3 IMPLANT
SYS KII OPTICAL ACCESS 15MM (TROCAR) ×3
SYSTEM KII OPTICAL ACCESS 15MM (TROCAR) ×2 IMPLANT
TOWEL OR 17X26 10 PK STRL BLUE (TOWEL DISPOSABLE) ×6 IMPLANT
TRAY FOLEY CATH 14FRSI W/METER (CATHETERS) ×3 IMPLANT
TRAY LAP CHOLE (CUSTOM PROCEDURE TRAY) ×3 IMPLANT
TROCAR ADV FIXATION 11X100MM (TROCAR) ×3 IMPLANT
TROCAR HASSON GELL 12X100 (TROCAR) ×3 IMPLANT
TROCAR XCEL NON-BLD 11X100MML (ENDOMECHANICALS) ×3 IMPLANT
TROCAR Z-THREAD FIOS 11X100 BL (TROCAR) IMPLANT
TROCAR Z-THREAD FIOS 5X100MM (TROCAR) IMPLANT
TROCAR Z-THREAD SLEEVE 11X100 (TROCAR) ×6 IMPLANT
TUBE CALIBRATION LAPBAND (TUBING) IMPLANT
TUBING INSUFFLATION 10FT LAP (TUBING) ×3 IMPLANT

## 2012-07-03 NOTE — Anesthesia Postprocedure Evaluation (Signed)
Anesthesia Post Note  Patient: [name]  Procedure(s) Performed: Procedure(s) (LRB): LAPAROSCOPY DIAGNOSTIC (N/A) LAPAROSCOPIC REVISION OF GASTRIC BAND (N/A) MESH APPLIED TO LAP PORT (N/A)  Anesthesia type: General  Patient location: PACU  Post pain: Pain level controlled  Post assessment: Post-op Vital signs reviewed  Last Vitals: BP 122/74  Pulse 72  Temp 36.7 C (Oral)  Resp 14  Ht 5\' 11"  (1.803 m)  Wt 225 lb 4 oz (102.173 kg)  BMI 31.42 kg/m2  SpO2 96%  Post vital signs: Reviewed  Level of consciousness: sedated  Complications: No apparent anesthesia complications

## 2012-07-03 NOTE — Transfer of Care (Signed)
Immediate Anesthesia Transfer of Care Note  Patient: [name]  Procedure(s) Performed: Procedure(s) (LRB) with comments: LAPAROSCOPY DIAGNOSTIC (N/A) - Exploratory Laparoscopy  LAPAROSCOPIC REVISION OF GASTRIC BAND (N/A) - removal of old lap. band port, replaced with AP standard band MESH APPLIED TO LAP PORT (N/A)  Patient Location: PACU  Anesthesia Type:General  Level of Consciousness: awake and oriented  Airway & Oxygen Therapy: Patient Spontanous Breathing and Patient connected to face mask oxygen  Post-op Assessment: Report given to PACU RN and Post -op Vital signs reviewed and stable  Post vital signs: Reviewed and stable  Complications: No apparent anesthesia complications

## 2012-07-03 NOTE — Progress Notes (Signed)
NOS  Looks good. Has gotten up and walked a couple of times already. Daughter in room.  Ovidio Kin, MD, [facility] Surgery Pager: [phone] Office phone:  [phone]

## 2012-07-03 NOTE — Progress Notes (Signed)
pacu nursing:  One bag of belongings to floor with patient

## 2012-07-03 NOTE — H&P (Signed)
Chief Complaint:  Loss of Lapband functionality with fills--producing GER that has restricted further weight loss  History of Present Illness:  [name] is an 56 y.o. female that has an APL lapband placed with a posterior HH repair by my in 2011.  She initially experienced good weight loss until she had an unusual URI that left her with a tender port and GER.  Port revision and inspection was done in March but it failed to see any obvious problems with the lapband.  Since the port was revised, the pain in this area has resolved.    She saw Dr. Biagio Quint about a gastric sleeve last summer; she does not wish to have a gastric bypass.  She wants to get back on track with her weight loss and wants [place] to revise the band if possible.  Insurance as approved a sleeve if we deem that we cannot improve her band.  An intraop decision will need to be made;  I am aware that immediate conversion to a sleeve has a higher complication rate and that optimal conversion would occur about 3 months after removing the band.  I made [facility] aware of that and indicated that we may need to remove her band and come back another day.    Past Medical History  Diagnosis Date  . Allergic rhinitis   . Menopause   . GERD (gastroesophageal reflux disease)   . IBS (irritable bowel syndrome)   . Anxiety   . Depression   . Hypertension   . Hyperlipemia   . Asthma     hx bronchial asthma at times with upper resp infection  . Headache     migraine and cluster headaches  . Stress incontinence     at times  . Diabetes mellitus type II     type 2    Past Surgical History  Procedure Date  . Cesarean section 1989 & 1993    X 2  . Cervical laminectomy 2005 & 2009    X 2   NO ROM PROBLEMS  . Foot surgery 2005    RT HEEL  . Right knee 1981    ARTHROSCOPY AND ARTHROTOMY  . Nasal sinus surgery 1995 & 1997    X 2  . Laparoscopic gastric banding 12/30/09  . Esophagogastroduodenoscopy 06/29/2011    Procedure:  ESOPHAGOGASTRODUODENOSCOPY (EGD);  Surgeon: Kandis Cocking, MD;  Location: [facility];  Service: General;  Laterality: N/A;  . Hysteroscopy 1999  . Laparoscopy 09/24/2011    Procedure: LAPAROSCOPY DIAGNOSTIC;  Surgeon: Valarie Merino, MD;  Location: [facility];  Service: General;  Laterality: N/A;  . Gastric banding port revision 09/24/2011    Procedure: GASTRIC BANDING PORT REVISION;  Surgeon: Valarie Merino, MD;  Location: [facility];  Service: General;  Laterality: N/A;  . Tonsillectomy 1971  . Abdominal hysterectomy 12/1999  . Cholecystectomy 1986  . Tubal ligation 1993    WITH C -SECTION  . Right knee arthroscopy and arthrotomy 12-1979  . Cervical conization w/bx june 1990    dysplasia of cervix/used 5Fu cream for 3 months    Current Facility-Administered Medications  Medication Dose Route Frequency Provider Last Rate Last Dose  . ceFAZolin (ANCEF) IVPB 2 g/50 mL premix  2 g Intravenous 30 min Pre-Op Loletta Specter, [facility]      . lactated ringers infusion   Intravenous Continuous Gaylan Gerold, MD 100 mL/hr at 07/03/12 0910 1,000 mL at 07/03/12 0910   Isoniazid; Nitrofurantoin; Hctz; Promethazine hcl;  Macrolides and ketolides; Morphine; Percocet; Sulfamethoxazole w-trimethoprim; and Fentanyl Family History  Problem Relation Age of Onset  . Diabetes Mother   . Stroke Mother   . Breast cancer Mother   . Atrial fibrillation Mother   . Hypertension Mother   . Cancer Mother     breast  . Diabetes Father   . Stroke Father   . Hypertension Father   . Heart attack Father   . Thyroid disease    . Heart disease     Social History:   reports that she has never smoked. She has never used smokeless tobacco. She reports that she does not drink alcohol or use illicit drugs.   REVIEW OF SYSTEMS - PERTINENT POSITIVES ONLY: No DVT  Physical Exam:   Blood pressure 124/67, pulse 76, temperature 99.5 F (37.5 C), temperature source Oral, resp. rate 18, height 5\' 11"  (1.803 m), weight  225 lb 4 oz (102.173 kg), SpO2 99.00%. Body mass index is 31.42 kg/(m^2).  Gen:  WDWN WF NAD  Neurological: Alert and oriented to person, place, and time. Motor and sensory function is grossly intact  Head: Normocephalic and atraumatic.  Eyes: Conjunctivae are normal. Pupils are equal, round, and reactive to light. No scleral icterus.  Neck: Normal range of motion. Neck supple. No tracheal deviation or thyromegaly present.  Cardiovascular:  SR without murmurs or gallops.  No carotid bruits Respiratory: Effort normal.  No respiratory distress. No chest wall tenderness. Breath sounds normal.  No wheezes, rales or rhonchi.  Abdomen:  nontender GU: Musculoskeletal: Normal range of motion. Extremities are nontender. No cyanosis, edema or clubbing noted Lymphadenopathy: No cervical, preauricular, postauricular or axillary adenopathy is present Skin: Skin is warm and dry. No rash noted. No diaphoresis. No erythema. No pallor. Pscyh: Normal mood and affect. Behavior is normal. Judgment and thought content normal.   LABORATORY RESULTS: Results for orders placed during the hospital encounter of 07/03/12 (from the past 48 hour(s))  GLUCOSE, CAPILLARY     Status: Abnormal   Collection Time   07/03/12  8:35 AM      Component Value Range Comment   Glucose-Capillary 115 (*) 70 - 99 mg/dL     RADIOLOGY RESULTS: No results found.  Problem List: Patient Active Problem List  Diagnosis  . ONYCHOMYCOSIS, BILATERAL  . DIABETES MELLITUS, TYPE II  . VITAMIN B12 DEFICIENCY  . UNSPECIFIED VITAMIN D DEFICIENCY  . HYPERLIPIDEMIA NEC/NOS  . MORBID OBESITY  . OTHER SPECIFIED ANEMIAS  . ANXIETY  . DEPRESSION  . HYPERTENSION, ESSENTIAL NOS  . ACUTE SINUSITIS, UNSPECIFIED  . ACUTE PHARYNGITIS  . BRONCHITIS, ACUTE  . ALLERGIC RHINITIS  . CELLULITIS  . Pain in Joint, Multiple Sites  . BACK PAIN, LUMBAR  . Unspecified Myalgia and Myositis  . SKIN RASH  . HEADACHE  . COUGH  . NAUSEA  . DIARRHEA  .  STRESS INCONTINENCE  . BARIATRIC SURGERY STATUS  . ABDOMINAL PAIN, ACUTE  . Lapband APL + HH repair June 2011  . GERD (gastroesophageal reflux disease)    Assessment & Plan: Lapband with GER complicating weight loss.  Laparoscopy with attempt to correct and possible band removal.      [name], MD, [facility] [phone] beeper [phone]  07/03/2012 9:45 AM

## 2012-07-03 NOTE — Preoperative (Signed)
Beta Blockers   Reason not to administer Beta Blockers:Not Applicable 

## 2012-07-03 NOTE — Anesthesia Procedure Notes (Signed)
Procedure Name: Intubation Date/Time: 07/03/2012 10:20 AM Performed by: Leroy Libman L Patient Re-evaluated:Patient Re-evaluated prior to inductionOxygen Delivery Method: Circle system utilized Preoxygenation: Pre-oxygenation with 100% oxygen Intubation Type: IV induction Ventilation: Mask ventilation without difficulty and Oral airway inserted - appropriate to patient size Tube type: Parker flex tip Number of attempts: 1 Airway Equipment and Method: Video-laryngoscopy Placement Confirmation: ETT inserted through vocal cords under direct vision,  breath sounds checked- equal and bilateral and positive ETCO2 Secured at: 21 cm Tube secured with: Tape Comments: Glidescope used electively.  Prior intubations with glidescope.  Cervical fusion C3-C7

## 2012-07-03 NOTE — Anesthesia Preprocedure Evaluation (Addendum)
Anesthesia Evaluation  Patient identified by MRN, date of birth, ID band Patient awake    Reviewed: Allergy & Precautions, H&P , NPO status , Patient's Chart, lab work & pertinent test results  Airway Mallampati: II TM Distance: >3 FB Neck ROM: Full    Dental  (+) Teeth Intact and Dental Advisory Given   Pulmonary asthma ,  breath sounds clear to auscultation  Pulmonary exam normal       Cardiovascular hypertension, Pt. on medications Rhythm:Regular Rate:Normal     Neuro/Psych  Headaches, PSYCHIATRIC DISORDERS Anxiety Depression  Neuromuscular disease    GI/Hepatic negative GI ROS, Neg liver ROS, GERD-  Medicated and Poorly Controlled,  Endo/Other  diabetes, Type 2  Renal/GU negative Renal ROS  negative genitourinary   Musculoskeletal negative musculoskeletal ROS (+)   Abdominal (+) + obese,   Peds negative pediatric ROS (+)  Hematology negative hematology ROS (+)   Anesthesia Other Findings   Reproductive/Obstetrics negative OB ROS                           Anesthesia Physical  Anesthesia Plan  ASA: III  Anesthesia Plan: General   Post-op Pain Management:    Induction: Intravenous  Airway Management Planned: Oral ETT  Additional Equipment:   Intra-op Plan:   Post-operative Plan: Extubation in OR  Informed Consent: I have reviewed the patients History and Physical, chart, labs and discussed the procedure including the risks, benefits and alternatives for the proposed anesthesia with the patient or authorized representative who has indicated his/her understanding and acceptance.   Dental advisory given  Plan Discussed with: CRNA  Anesthesia Plan Comments:         Anesthesia Quick Evaluation

## 2012-07-03 NOTE — Brief Op Note (Signed)
07/03/2012  12:56 PM  PATIENT:  [name]  56 y.o. female  PRE-OPERATIVE DIAGNOSIS:  GERD, esophageal dysmotility, lap band complication  POST-OPERATIVE DIAGNOSIS:  gerd, esophageal dysmotility, lap band complication  PROCEDURE:  Procedure(s) (LRB) with comments: LAPAROSCOPY DIAGNOSTIC (N/A) - Exploratory Laparoscopy  LAPAROSCOPIC REVISION OF GASTRIC BAND (N/A) - removal of old lap. band port, replaced with AP standard band MESH APPLIED TO LAP PORT (N/A)  SURGEON:  Surgeon(s) and Role:    * Valarie Merino, MD - Primary  PHYSICIAN ASSISTANT:   ASSISTANTS: Lodema Pilot, DO   ANESTHESIA:   general  EBL:  Total I/O In: 2000 [I.V.:2000] Out: 150 [Urine:125; Blood:25]  BLOOD ADMINISTERED:none  DRAINS: none   LOCAL MEDICATIONS USED:  MARCAINE     SPECIMEN:  No Specimen  DISPOSITION OF SPECIMEN:  N/A  COUNTS:  YES  TOURNIQUET:  * No tourniquets in log *  DICTATION: .Other Dictation: Dictation Number [id number]  PLAN OF CARE: Admit for overnight observation  PATIENT DISPOSITION:  PACU - hemodynamically stable.   Delay start of Pharmacological VTE agent (>24hrs) due to surgical blood loss or risk of bleeding: no

## 2012-07-04 ENCOUNTER — Observation Stay (HOSPITAL_COMMUNITY): Payer: 59

## 2012-07-04 ENCOUNTER — Encounter (HOSPITAL_COMMUNITY): Payer: Self-pay | Admitting: Surgery

## 2012-07-04 DIAGNOSIS — Z09 Encounter for follow-up examination after completed treatment for conditions other than malignant neoplasm: Secondary | ICD-10-CM

## 2012-07-04 LAB — CBC WITH DIFFERENTIAL/PLATELET
Basophils Absolute: 0 10*3/uL (ref 0.0–0.1)
Basophils Relative: 0 % (ref 0–1)
Eosinophils Absolute: 0 10*3/uL (ref 0.0–0.7)
Eosinophils Relative: 0 % (ref 0–5)
HCT: 35.6 % — ABNORMAL LOW (ref 36.0–46.0)
Hemoglobin: 11.8 g/dL — ABNORMAL LOW (ref 12.0–15.0)
Lymphocytes Relative: 12 % (ref 12–46)
Lymphs Abs: 1.4 10*3/uL (ref 0.7–4.0)
MCH: 27.3 pg (ref 26.0–34.0)
MCHC: 33.1 g/dL (ref 30.0–36.0)
MCV: 82.4 fL (ref 78.0–100.0)
Monocytes Absolute: 0.8 10*3/uL (ref 0.1–1.0)
Monocytes Relative: 7 % (ref 3–12)
Neutro Abs: 9.6 10*3/uL — ABNORMAL HIGH (ref 1.7–7.7)
Neutrophils Relative %: 82 % — ABNORMAL HIGH (ref 43–77)
Platelets: 232 10*3/uL (ref 150–400)
RBC: 4.32 MIL/uL (ref 3.87–5.11)
RDW: 13.3 % (ref 11.5–15.5)
WBC: 11.8 10*3/uL — ABNORMAL HIGH (ref 4.0–10.5)

## 2012-07-04 MED ORDER — IOHEXOL 300 MG/ML  SOLN: 20.0000 mL | Freq: Once | INTRAMUSCULAR | Status: DC | PRN

## 2012-07-04 MED ORDER — OXYCODONE-ACETAMINOPHEN 5-325 MG/5ML PO SOLN: 5.0000 mL | ORAL | Status: DC | PRN

## 2012-07-04 NOTE — Progress Notes (Signed)
*  Preliminary Results* Bilateral lower extremity venous duplex completed. Bilateral lower extremities are negative for deep vein thrombosis. No evidence of Baker's cyst bilaterally.  07/04/2012 9:41 AM Gertie Fey, RDMS, RDCS

## 2012-07-04 NOTE — Care Management Note (Signed)
    Page 1 of 1   07/04/2012     2:22:40 PM   CARE MANAGEMENT NOTE 07/04/2012  Patient:  [name], [name]   Account Number:  [id number]  Date Initiated:  07/04/2012  Documentation initiated by:  Lorenda Ishihara  Subjective/Objective Assessment:   56 yo female admitted s/p lap band revision. PTA lived at home with spouse.     Action/Plan:   Home when stable   Anticipated DC Date:  07/04/2012   Anticipated DC Plan:  HOME/SELF CARE      DC Planning Services  CM consult      Choice offered to / List presented to:             Status of service:  Completed, signed off Medicare Important Message given?   (If response is "NO", the following Medicare IM given date fields will be blank) Date Medicare IM given:   Date Additional Medicare IM given:    Discharge Disposition:  HOME/SELF CARE  Per UR Regulation:  Reviewed for med. necessity/level of care/duration of stay  If discussed at Long Length of Stay Meetings, dates discussed:    Comments:

## 2012-07-04 NOTE — Progress Notes (Signed)
Dr. [name] aware of UGI results; orders received to discharge home; pt and Clarissa, RN aware of results and orders. Joen Laura, RN BAriatric Nurse coordinator

## 2012-07-04 NOTE — Discharge Summary (Signed)
Physician Discharge Summary  Patient ID: [name] MRN: [id number] DOB/AGE: 01-29-1956 56 y.o.  Admit date: 07/03/2012 Discharge date: 07/04/2012  Admission Diagnoses:  Weight loss plateau after lapband APL  Discharge Diagnoses:  Same  Active Problems:  * No active hospital problems. *    Surgery:  Replacement of APL band with APS band and resiting  Discharged Condition: stable  Hospital Course:   Had surgery.  Kept overnight.  Consults: none  Significant Diagnostic Studies: UGI and DVT study    Discharge Exam: Blood pressure 103/67, pulse 64, temperature 97.6 F (36.4 C), temperature source Oral, resp. rate 14, height 5\' 11"  (1.803 m), weight 225 lb 4 oz (102.173 kg), SpO2 98.00%. No pain.  VSS  Disposition: 01-Home or Self Care  Discharge Orders    Future Appointments: Provider: Department: Dept Phone: Center:   07/06/2012 2:00 PM Valarie Merino, MD [facility], [place] [phone] None   07/27/2012 8:15 AM Lelon Perla, DO [facility] [place] [phone] [facility]     Future Orders Please Complete By Expires   Diet - low sodium heart healthy      Increase activity slowly      No wound care          Medication List     As of 07/04/2012  9:25 AM    STOP taking these medications         metoCLOPramide 5 MG tablet   Commonly known as: REGLAN      omeprazole 20 MG capsule   Commonly known as: PRILOSEC      TAKE these medications         ACCU-CHEK AVIVA test strip   Generic drug: glucose blood   TEST 3 TIMES PER DAY AS DIRECTED      albuterol 108 (90 BASE) MCG/ACT inhaler   Commonly known as: PROVENTIL HFA;VENTOLIN HFA   Inhale 1-2 puffs into the lungs every 6 (six) hours as needed. Wheezing      CALTRATE GUMMY BITES PO   Take 2 tablets by mouth at bedtime.      citalopram 20 MG tablet   Commonly known as: CELEXA   Take 20 mg by mouth every evening.      cyclobenzaprine 10 MG tablet   Commonly known  as: FLEXERIL   Take 10 mg by mouth 3 (three) times daily as needed. Muscle spasm      estradiol 0.1 MG/24HR   Commonly known as: VIVELLE-DOT   Place 1 patch (0.1 mg total) onto the skin 2 (two) times a week. Monday and Thursday      Estradiol 10 MCG Tabs   Place 1 tablet (10 mcg total) vaginally 2 (two) times a week.      furosemide 20 MG tablet   Commonly known as: LASIX   Take 20 mg by mouth daily as needed. For swelling      KLOR-CON M10 10 MEQ tablet   Generic drug: potassium chloride   Take 10 mEq by mouth daily.      Lancet Device Misc   "avia" check three times a day        loratadine 10 MG tablet   Commonly known as: CLARITIN   Take 10 mg by mouth every evening.      olmesartan 20 MG tablet   Commonly known as: BENICAR   Take 10 mg by mouth at bedtime.      ONE-A-DAY SCOOBY-DOO GUMMIES PO   Take 1 tablet by  mouth daily.      oxyCODONE-acetaminophen 5-325 MG/5ML solution   Commonly known as: ROXICET   Take 5-10 mLs by mouth every 4 (four) hours as needed.      rosuvastatin 20 MG tablet   Commonly known as: CRESTOR   Take 20 mg by mouth at bedtime.      VICTOZA 18 MG/3ML Soln   Generic drug: Liraglutide   Inject 1.2 mg into the skin at bedtime.           Follow-up Information    Follow up with Valarie Merino, MD.   Contact information:   [address] [place] [address] [phone]          Signed: Valarie Merino 07/04/2012, 9:25 AM

## 2012-07-04 NOTE — Progress Notes (Signed)
Pt alert and oriented; VSS; denies any nausea or vomiting; tolerating water well; will advance to protein shakes; burping; +flatus; no BM; voiding without difficulty; ambulating in hallways without difficulty; using incentive spirometer as directed; awaiting UGI and doppler study; pt already has follow up appts with [facility] and [facility]. Joen Laura, RN Bariatric Nurse Coordinator

## 2012-07-04 NOTE — Progress Notes (Signed)
Discharge instructions reviewed with pt and husband and they verbalized understanding. Pt already has follow up appts with [facility] and will contact [facility] for follow up in 2 weeks. Pt aware of support group and BELT program. ADJUSTABLE GASTRIC BAND DISCHARGE INSTRUCTIONS  Drs. Fredrik Rigger, Hoxworth, Wilson, and [place] Call if you have any problems.   Call [phone] and ask for the surgeon on call.    If you need immediate assistance come to the ER at [facility]. Tell the ER personnel that you are a new post-op gastric banding patient. Signs and symptoms to report:   Severe vomiting or nausea. If you cannot tolerate clear liquids for longer than 1 day, you need to call your surgeon.    Abdominal pain which does not get better after taking your pain medication   Fever greater than 101 F degree   Difficulty breathing   Chest pain    Redness, swelling, drainage, or foul odor at incision sites    If your incisions open or pull apart   Swelling or pain in calf (lower leg)   Diarrhea, frequent watery, uncontrolled bowel movements.   Constipation, (no bowel movements for 3 days) if this occurs, Take Milk of Magnesia, 2 tablespoons by mouth, 3 times a day for 2 days if needed.  Call your doctor if constipation continues. Stop taking Milk of Magnesia once you have had a bowel movement. You may also use Miralax according to the label instructions.   Anything you consider "abnormal for you".   Normal side effects after Surgery:   Unable to sleep at night or concentrate   Irritability   Being tearful (crying) or depressed   These are common complaints, possibly related to your anesthesia, stress of surgery and change in lifestyle, that usually go away a few weeks after surgery.  If these feelings continue, call your medical doctor.  Wound Care You may have surgical glue, steri-strips, or staples over your incisions after surgery.  Surgical glue:  Looks like a clear film over your incisions and will  wear off gradually. Steri-strips: Strips of tape over your incisions. You may notice a yellowish color on the skin underneath the steri-strips. This is a substance used to make the steri-strips stick better. Do not pull the steri-strips off - let them fall off. Staples: Cherlynn Polo may be removed before you leave the hospital. If you go home with staples, call [facility] [phone]) for an appointment with your surgeon's nurse to have staples removed in 7 - 10 days. Showering: You may shower two days after your surgery unless otherwise instructed by your surgeon. Wash gently around wounds with warm soapy water, rinse well, and gently pat dry.  If you have a drain, you may need someone to hold this while you shower. Avoid tub baths until staples are removed and incisions are healed.    Medications   Medications should be liquid or crushed if larger than the size of a dime.  Extended release pills should not be crushed.   Depending on the size and number of medications you take, you may need to stagger/change the time you take your medications so that you do not over-fill your pouch.    Make sure you follow-up with your primary care physician to make medication adjustments needed during rapid weight loss and life-style adjustment.   If you are diabetic, follow up with the doctor that prescribes your diabetes medication(s) within one week after surgery and check your blood sugar regularly.  Do not drive while taking narcotics!   Do not take acetaminophen (Tylenol) and Roxicet or Lortab Elixir at the same time since these pain medications contain acetaminophen.  Diet at home: (First 2 Weeks)  You will see the nutritionist two weeks after your surgery. She will advance your diet if you are tolerating liquids well. Once at home, if you have severe vomiting or nausea and cannot tolerate clear liquids lasting longer than 1 day, call your surgeon.  For Same Day Surgery Discharge Patients: The  day of surgery drink water only: 2 ounces every 4 hours. If you are tolerating water, begin drinking your high protein shake the next morning. For Overnight Stay Patients: Begin high protein shake 2 ounces every 3 hours, 5 - 6 times per day.  Gradually increase the amount you drink as tolerated.  You may find it easier to slowly sip shakes throughout the day.  It is important to get your proteins in first.   Protein Shake   Drink at least 2 ounces of shake 5-6 times per day   Each serving of protein shakes should have a minimum of 15 grams of protein and no more than 5 grams of carbohydrate    Increase the amount of protein shake you drink as tolerated   Protein powder may be added to fluids such as non-fat milk or Lactaid milk (limit to 20 grams added protein powder per serving   The initial goal is to drink at least 8 ounces of protein shake/drink per day (or as directed by the nutritionist). Some examples of protein shakes are [employer], Dillard's, EAS Edge HP, and Unjury. Hydration   Gradually increase the amount of water and other liquids as tolerated (See Acceptable Fluids)   Gradually increase the amount of protein shake as tolerated     Sip fluids slowly and throughout the day   May use Sugar substitutes, use sparingly (limit to 6 - 8 packets per day).  Your fluid goal is 64 ounces of fluid daily. It may take a few weeks to build up to this.         32 oz (or more) should be clear liquids and 32 oz (or more) should be full liquids.         Liquids should not contain sugar, caffeine, or carbonation!  Acceptable Fluids Clear Liquids:   Water or Sugar-free flavored water, Fruit H2O   Decaffeinated coffee or tea (sugar-free)   Crystal Lite, Wyler's Lite, Minute Maid Lite   Sugar-free Jell-O   Bouillon or broth   Sugar-free Popsicle:   *Less than 20 calories each; Limit 1 per day   Full Liquids:              Protein Shakes/Drinks + 2 choices per day of other full liquids  shown below.    Other full liquids must be: No more than 12 grams of Carbs per serving,  No more than 3 grams of Fat per serving   Strained low-fat cream soup   Non-Fat milk   Fat-free Lactaid Milk   Sugar-free yogurt (Dannon Lite & Fit) Vitamins and Minerals (Start 1 day after surgery unless otherwise directed)   1 Chewable Multivitamin / Multimineral Supplement (i.e. Centrum for Adults)   Chewable Calcium Citrate with Vitamin D-3. Take 1500 mg each day.           (Example: 3 Chewable Calcium Plus 600 with Vitamin D-3 can be found at [employer])  Do not mix multivitamins containing iron with calcium supplements; take 2 hours   apart   Do not substitute Tums (calcium carbonate) for your calcium   Menstruating women and those at risk for anemia may need extra iron. Talk with your doctor to see if you need additional iron.     If you need extra iron:  Total daily Iron recommendations (including Vitamins) = 50 - 100 mg Iron/day Do not stop taking or change any vitamins or minerals until you talk to your nutritionist or surgeon. Your nutritionist and / or physician must approve all vitamin and mineral supplements. Exercise For maximum success, begin exercising as soon as your doctor recommends. Make sure your physician approves any physical activity.   Depending on fitness level, begin with a simple walking program   Walk 5-15 minutes each day, 7 days per week.    Slowly increase until you are walking 30-45 minutes per day   Consider joining our BELT program. [phone] or email [email or web address] Things to remember:   You may have sexual relations when you feel comfortable. It is VERY important for female patients to use a reliable birth control method. Fertility often increases after surgery. Do not get pregnant for at least 18 months.   It is very important to keep all follow up appointments with your surgeon, nutritionist, primary care physician, and behavioral health practitioner. After  the first year, please follow up with your bariatric surgeon at least once a year in order to maintain best weight loss results.  [facility]: [phone] [facility]: [phone]   Free counseling is available for you and your family through collaboration between [facility] and [place]. Please call [phone] and leave a message.    Consider purchasing a medical alert bracelet that says you had lap-band surgery.    [facility] has a free Bariatric Surgery Support Group that meets monthly, the 3rd Thursday, 6 pm, Classroom #1, [place]. You may register online at [email or web address], but registration is not necessary. Select Classes and Support Groups, Bariatric Surgery, or Call [phone]   Do not return to work or drive until cleared by your surgeon   Use your CPAP when sleeping if applicable   Do not lift anything greater than ten pounds for at least two weeks.   You will probably have your first fill (fluid added to your band) 6 weeks after surgery  Joen Laura, RN Bariatric Nurse coordinator

## 2012-07-04 NOTE — Op Note (Signed)
[name], [name] NO.:  [id number]  MEDICAL RECORD NO.:  [id number]  LOCATION:  1527                         FACILITY:  [facility]  PHYSICIAN:  Thornton Park. [name], MD  DATE OF BIRTH:  Apr 12, 1956  DATE OF PROCEDURE:  07/03/2012 DATE OF DISCHARGE:                              OPERATIVE REPORT   PREOPERATIVE DIAGNOSIS:  Increasing reflux with band tightening and inability to adjust after upper respiratory tract infection and plateaued weight loss.  PROCEDURE:  Laparoscopy with foregut dissection, removal of APL band and replacement with APS band.  SURGEON:  Thornton Park. [name], MD  ASSISTANT:  Lodema Pilot, MD  ANESTHESIA:  General endotracheal.  DESCRIPTION OF PROCEDURE:  [name] was taken to room #1 and given general anesthesia.  The abdomen was prepped with PC Max and draped sterilely.  A time-out was performed.  I entered the abdomen through the left upper quadrant using a 0-degree 5-mm Optiview without difficulty. Once insufflation occurred, I placed another 5 down into the previous made incision to the left of the umbilicus and used new site sort of medial to the port for initially a 5 and then later a 12 and then a 15 laterally.  A 5-mm in the upper abdomen was used to place the [facility].  The band was encased pretty much in scar beneath the liver. I took this down, and it looked to me that the anterior plication had pulled away.  This was an APL band and when she had lost weight, I think that it was too big for her proximally.  I went ahead and we freed up the entire tract and I took down the lateral plication and took it down pretty much in its entirety.  I then dissected up and looked at both the right and left crura.  With 10 mL of air in the calibration tubing, it would hang at the EG junction.  The hearing was even more prominent when I would hold the portion of the anterior area that had been plicated down.  I felt like exchanging  this band out would likely be the best means of making this better.  I went ahead and used the band passer to go along the tract and felt that the degree of scar tissue was going to make it unsafe to try to create a new posterior tract.  Also, the previous band position actually looked pretty good and was a good angle. With that, I passed an APS band into the abdomen and brought it around and I buckled it over the sizing tubing.  With through tubing in place, I went ahead and plicated it with three sutures leaving good alignment and getting totally different bites of the anterior stomach and the Neo pouch proximally.  There was no dimple or hiatal hernia to fix.  The band looked good and the tubing was removed.  The tubing was then brought out through the port and was tunneled to lie in the same area that had been before.  The port had some mesh behind it and I went it up anchoring it to the fascia with two sutures holding it down to the anterior abdominal wall.  Wounds were irrigated and were infiltrated with Exparel.  A 15-mm port was approximated with a 0 Vicryl passed with an Endoclose device.  The abdomen was then deflated and wounds were closed with 4-0 Vicryl with Dermabond.  The patient tolerated the procedure well, was taken to the recovery room in satisfactory condition.     Thornton Park [name], MD     MBM/MEDQ  D:  07/03/2012  T:  07/04/2012  Job:  [id number]

## 2012-07-05 ENCOUNTER — Encounter: Payer: Self-pay | Admitting: *Deleted

## 2012-07-05 NOTE — Progress Notes (Signed)
Notified patient via email that her 2 week Post-Op class will be held on 07/18/12 @ 4pm at [facility].

## 2012-07-06 ENCOUNTER — Ambulatory Visit (INDEPENDENT_AMBULATORY_CARE_PROVIDER_SITE_OTHER): Payer: Commercial Managed Care - PPO | Admitting: Surgery

## 2012-07-06 ENCOUNTER — Encounter (INDEPENDENT_AMBULATORY_CARE_PROVIDER_SITE_OTHER): Payer: Self-pay | Admitting: Surgery

## 2012-07-06 ENCOUNTER — Encounter (INDEPENDENT_AMBULATORY_CARE_PROVIDER_SITE_OTHER): Payer: Self-pay

## 2012-07-06 VITALS — BP 130/68 | HR 76 | Temp 97.0°F | Resp 16 | Ht 71.0 in | Wt 228.6 lb

## 2012-07-06 DIAGNOSIS — Z9884 Bariatric surgery status: Secondary | ICD-10-CM

## 2012-07-06 NOTE — Progress Notes (Signed)
[name] 56 y.o.  Body mass index is 31.88 kg/(m^2).  Patient Active Problem List  Diagnosis  . ONYCHOMYCOSIS, BILATERAL  . DIABETES MELLITUS, TYPE II  . VITAMIN B12 DEFICIENCY  . UNSPECIFIED VITAMIN D DEFICIENCY  . HYPERLIPIDEMIA NEC/NOS  . MORBID OBESITY  . OTHER SPECIFIED ANEMIAS  . ANXIETY  . DEPRESSION  . HYPERTENSION, ESSENTIAL NOS  . ACUTE SINUSITIS, UNSPECIFIED  . ACUTE PHARYNGITIS  . BRONCHITIS, ACUTE  . ALLERGIC RHINITIS  . CELLULITIS  . Pain in Joint, Multiple Sites  . BACK PAIN, LUMBAR  . Unspecified Myalgia and Myositis  . SKIN RASH  . HEADACHE  . COUGH  . NAUSEA  . DIARRHEA  . STRESS INCONTINENCE  . BARIATRIC SURGERY STATUS  . ABDOMINAL PAIN, ACUTE  . Lapband APL + HH repair June 2011  . GERD (gastroesophageal reflux disease)    Allergies  Allergen Reactions  . Isoniazid     Severe SOB  . Nitrofurantoin Shortness Of Breath    REACTION: whelps  . Hctz (Hydrochlorothiazide)     rash  . Promethazine Hcl (Promethazine Hcl)     IF GIVEN  IV-hallucinations     CAN TAKE PO PHENERGAN  . Macrolides And Ketolides     rash  . Morphine     REACTION: severe vomiting  . Percocet (Oxycodone-Acetaminophen)     REACTION: severe itching  . Sulfamethoxazole W-Trimethoprim     Septra / Bactrim  --REACTION: rash  . Fentanyl Hives and Rash    REACTION: whelps    Past Surgical History  Procedure Date  . Cesarean section 1989 & 1993    X 2  . Cervical laminectomy 2005 & 2009    X 2   NO ROM PROBLEMS  . Foot surgery 2005    RT HEEL  . Right knee 1981    ARTHROSCOPY AND ARTHROTOMY  . Nasal sinus surgery 1995 & 1997    X 2  . Laparoscopic gastric banding 12/30/09  . Esophagogastroduodenoscopy 06/29/2011    Procedure: ESOPHAGOGASTRODUODENOSCOPY (EGD);  Surgeon: Kandis Cocking, MD;  Location: [facility];  Service: General;  Laterality: N/A;  . Hysteroscopy 1999  . Laparoscopy 09/24/2011    Procedure: LAPAROSCOPY DIAGNOSTIC;  Surgeon: Valarie Merino,  MD;  Location: [facility];  Service: General;  Laterality: N/A;  . Gastric banding port revision 09/24/2011    Procedure: GASTRIC BANDING PORT REVISION;  Surgeon: Valarie Merino, MD;  Location: [facility];  Service: General;  Laterality: N/A;  . Tonsillectomy 1971  . Abdominal hysterectomy 12/1999  . Cholecystectomy 1986  . Tubal ligation 1993    WITH C -SECTION  . Right knee arthroscopy and arthrotomy 12-1979  . Cervical conization w/bx june 1990    dysplasia of cervix/used 5Fu cream for 3 months  . Laparoscopy 07/03/2012    Procedure: LAPAROSCOPY DIAGNOSTIC;  Surgeon: Valarie Merino, MD;  Location: [facility];  Service: General;  Laterality: N/A;  Exploratory Laparoscopy   . Laparoscopic revision of gastric band 07/03/2012    Procedure: LAPAROSCOPIC REVISION OF GASTRIC BAND;  Surgeon: Valarie Merino, MD;  Location: [facility];  Service: General;  Laterality: N/A;  removal of old lap. band port, replaced with AP standard band  . Mesh applied to lap port 07/03/2012    Procedure: MESH APPLIED TO LAP PORT;  Surgeon: Valarie Merino, MD;  Location: [facility];  Service: General;  Laterality: N/A;   [name], DO No diagnosis found.  I revised Sahar lap band completely.  She had an APL and we downsized it to an APS. The APL band had become attenuated anteriorly so the APS was able to be resited and re\re plicated anteriorly. A new port was also placed in the same spot although this time it was tacked to the fascia with 2 sutures as well as with mesh behind it. Her incisions look fine and she's otherwise seems to be doing very well. I'm going to see her again in 6 weeks to make her first fill appointment. [name], MD, [facility] [phone] beeper [phone]  07/06/2012 2:19 PM

## 2012-07-10 ENCOUNTER — Telehealth (INDEPENDENT_AMBULATORY_CARE_PROVIDER_SITE_OTHER): Payer: Self-pay | Admitting: General Surgery

## 2012-07-10 ENCOUNTER — Telehealth (INDEPENDENT_AMBULATORY_CARE_PROVIDER_SITE_OTHER): Payer: Self-pay

## 2012-07-10 NOTE — Telephone Encounter (Signed)
LMOM letting pt know first PO will be 1/9 at 11:50

## 2012-07-10 NOTE — Telephone Encounter (Signed)
Spoke with pt about her appointment on jan 9th.  She doesn't think that she will be able to make this appt.  She also said that she works at [employer] in the hall all the time".  I told her that the next time she sees him, to ask him if she needs to keep the appt.  If he says "yes" I will work on trying to change it to an afternoon time.

## 2012-07-18 ENCOUNTER — Encounter: Payer: 59 | Attending: Surgery | Admitting: *Deleted

## 2012-07-18 DIAGNOSIS — Z713 Dietary counseling and surveillance: Secondary | ICD-10-CM | POA: Insufficient documentation

## 2012-07-18 DIAGNOSIS — Z09 Encounter for follow-up examination after completed treatment for conditions other than malignant neoplasm: Secondary | ICD-10-CM | POA: Insufficient documentation

## 2012-07-18 DIAGNOSIS — Z9884 Bariatric surgery status: Secondary | ICD-10-CM | POA: Insufficient documentation

## 2012-07-18 NOTE — Patient Instructions (Addendum)
Patient to follow Phase 3A-Soft, High Protein Diet and follow-up at [facility] in 6 weeks for 2 months post-op nutrition visit for diet advancement. 

## 2012-07-19 ENCOUNTER — Encounter: Payer: Self-pay | Admitting: *Deleted

## 2012-07-19 NOTE — Progress Notes (Addendum)
Bariatric Class:  Appt start time: 1600 end time:  1700.  2 Week Post-Operative Nutrition Class  Patient was seen on 07/18/12 for Post-Operative Nutrition [facility].   Surgery date: 07/03/12 Surgery type: LAGB Revision Start weight at [facility]: 262.6 lbs Last weight: 234.0 lbs  Weight today: 221.2 lbs Weight change: 12.8 lbs Total weight lost: 41.4 lbs (from 262.6 lbs)  Weight goal: 180 lbs % goal met: 50%  TANITA  BODY COMP RESULTS  09/15/11 01/31/12 05/08/12 07/18/12  BMI (kg/m^2) 30.3 31.4 32.6 30.8  FM (lbs) 100.5 105.5 111.0 --  FFM (lbs) 116.0 120.0 123.0 --  TBW (lbs) 85.0 88.0 90.0 --   The following the learning objectives were met by the patient during this course:  Identifies Phase 3A (Soft, High Proteins) Dietary Goals and will begin from 2 weeks post-operatively to 2 months post-operatively  Identifies appropriate sources of fluids and proteins   States protein recommendations and appropriate sources post-operatively  Identifies the need for appropriate texture modifications, mastication, and bite sizes when consuming solids  Identifies appropriate multivitamin and calcium sources post-operatively  Describes the need for physical activity post-operatively and will follow MD recommendations  States when to call healthcare provider regarding medication questions or post-operative complications  Handouts given during class include:  Phase 3A: Soft, High Protein Diet Handout  Band Fill Guidelines Handout  Follow-Up Plan: Patient will follow-up at [facility] in 6 weeks for 2 months post-op nutrition visit for diet advancement per MD.

## 2012-07-20 ENCOUNTER — Encounter (INDEPENDENT_AMBULATORY_CARE_PROVIDER_SITE_OTHER): Payer: Commercial Managed Care - PPO | Admitting: Surgery

## 2012-07-27 ENCOUNTER — Ambulatory Visit (INDEPENDENT_AMBULATORY_CARE_PROVIDER_SITE_OTHER): Payer: 59 | Admitting: Family Medicine

## 2012-07-27 ENCOUNTER — Encounter: Payer: Self-pay | Admitting: Family Medicine

## 2012-07-27 ENCOUNTER — Telehealth: Payer: Self-pay | Admitting: Family Medicine

## 2012-07-27 VITALS — BP 120/60 | HR 95 | Temp 99.5°F | Wt 220.8 lb

## 2012-07-27 DIAGNOSIS — M549 Dorsalgia, unspecified: Secondary | ICD-10-CM

## 2012-07-27 DIAGNOSIS — F32A Depression, unspecified: Secondary | ICD-10-CM

## 2012-07-27 DIAGNOSIS — E119 Type 2 diabetes mellitus without complications: Secondary | ICD-10-CM

## 2012-07-27 DIAGNOSIS — J101 Influenza due to other identified influenza virus with other respiratory manifestations: Secondary | ICD-10-CM

## 2012-07-27 DIAGNOSIS — J111 Influenza due to unidentified influenza virus with other respiratory manifestations: Secondary | ICD-10-CM

## 2012-07-27 DIAGNOSIS — R509 Fever, unspecified: Secondary | ICD-10-CM

## 2012-07-27 DIAGNOSIS — I10 Essential (primary) hypertension: Secondary | ICD-10-CM

## 2012-07-27 DIAGNOSIS — F329 Major depressive disorder, single episode, unspecified: Secondary | ICD-10-CM

## 2012-07-27 DIAGNOSIS — E785 Hyperlipidemia, unspecified: Secondary | ICD-10-CM

## 2012-07-27 LAB — POCT INFLUENZA A/B
Influenza A, POC: POSITIVE
Influenza B, POC: NEGATIVE

## 2012-07-27 MED ORDER — CITALOPRAM HYDROBROMIDE 20 MG PO TABS: 20.0000 mg | ORAL_TABLET | Freq: Every evening | ORAL | Status: DC

## 2012-07-27 MED ORDER — CYCLOBENZAPRINE HCL 10 MG PO TABS: 10.0000 mg | ORAL_TABLET | Freq: Three times a day (TID) | ORAL | Status: DC | PRN

## 2012-07-27 MED ORDER — OSELTAMIVIR PHOSPHATE 75 MG PO CAPS: 75.0000 mg | ORAL_CAPSULE | Freq: Two times a day (BID) | ORAL | Status: DC

## 2012-07-27 MED ORDER — ROSUVASTATIN CALCIUM 20 MG PO TABS: 20.0000 mg | ORAL_TABLET | Freq: Every day | ORAL | Status: DC

## 2012-07-27 MED ORDER — FUROSEMIDE 20 MG PO TABS: 20.0000 mg | ORAL_TABLET | Freq: Every day | ORAL | Status: DC | PRN

## 2012-07-27 MED ORDER — POTASSIUM CHLORIDE CRYS ER 10 MEQ PO TBCR: 10.0000 meq | EXTENDED_RELEASE_TABLET | Freq: Every day | ORAL | Status: DC

## 2012-07-27 MED ORDER — OLMESARTAN MEDOXOMIL 20 MG PO TABS: 10.0000 mg | ORAL_TABLET | Freq: Every day | ORAL | Status: DC

## 2012-07-27 MED ORDER — GUAIFENESIN-CODEINE 100-10 MG/5ML PO SYRP: ORAL_SOLUTION | ORAL | Status: DC

## 2012-07-27 MED ORDER — LANCETS MISC: Status: DC

## 2012-07-27 MED ORDER — LIRAGLUTIDE 18 MG/3ML ~~LOC~~ SOLN: 1.2000 mg | Freq: Every day | SUBCUTANEOUS | Status: DC

## 2012-07-27 NOTE — Telephone Encounter (Signed)
Dr Lowne pt 

## 2012-07-27 NOTE — Telephone Encounter (Signed)
Patient Information:  Caller Name: [place]  Phone: [phone]  Patient: [name], [name]  Gender: Female  DOB: 1956/03/09  Age: 57 Years  PCP: Lelon Perla.  Pregnant: No  Office Follow Up:  Does the office need to follow up with this patient?: Yes  Instructions For The Office: Should patient continue with the Tamiflu since the vomiting started after the first dose?  RN Note:  The vomiting didn't start until she took the first dose of Tamiflu.  Symptoms  Reason For Call & Symptoms: Patient was seen this morning and dx with the flu.  Took 1 dose of Tamiflu but not able to take the Tylenol.  Has urinated this afternoon  Reviewed Health History In EMR: Yes  Reviewed Medications In EMR: Yes  Reviewed Allergies In EMR: Yes  Reviewed Surgeries / Procedures: Yes  Date of Onset of Symptoms: 07/27/2012 OB / GYN:  LMP: Unknown  Guideline(s) Used:  Influenza Follow-Up Call  Disposition Per Guideline:   Home Care  Reason For Disposition Reached:   Influenza and antiviral drugs, questions about  Advice Given:  N/A

## 2012-07-27 NOTE — Telephone Encounter (Signed)
no

## 2012-07-27 NOTE — Telephone Encounter (Signed)
Please advise on note from caller nurse.//AB/CMA 

## 2012-07-27 NOTE — Progress Notes (Signed)
  Subjective:     [name] is a 57 y.o. female who presents for evaluation of symptoms of a URI. Symptoms include achiness, congestion, fever 100.9, fever-duration 2  days and productive cough with  green colored sputum. Onset of symptoms was 2 days ago, and has been gradually worsening since that time. Treatment to date: cough suppressants and tylenol.  The following portions of the patient's history were reviewed and updated as appropriate: allergies, current medications, past family history, past medical history, past social history, past surgical history and problem list.  Review of Systems Pertinent items are noted in HPI.   Objective:    BP 120/60  Pulse 95  Temp 99.5 F (37.5 C) (Oral)  Wt 220 lb 12.8 oz (100.154 kg)  SpO2 96% General appearance: alert, cooperative, appears stated age and no distress Ears: normal TM's and external ear canals both ears Nose: turbinates red, swollen, no sinus tenderness Throat: abnormal findings: mild oropharyngeal erythema and pnd Neck: mild anterior cervical adenopathy, supple, symmetrical, trachea midline and thyroid not enlarged, symmetric, no tenderness/mass/nodules Lungs: diminished breath sounds bilaterally and rhonchi bilaterally Heart: S1, S2 normal   Assessment:    influenza   Plan:    Suggested symptomatic OTC remedies. Follow up as needed. tamilflu  Cough syrup rx --see orders F/u prn rto when feeling better for labs

## 2012-07-27 NOTE — Patient Instructions (Signed)

## 2012-07-28 NOTE — Telephone Encounter (Signed)
Detailed msg advising if medication is causing vomiting to discontinue and call back with any concerns.     KP

## 2012-08-23 ENCOUNTER — Encounter (INDEPENDENT_AMBULATORY_CARE_PROVIDER_SITE_OTHER): Payer: Commercial Managed Care - PPO | Admitting: Surgery

## 2012-08-26 ENCOUNTER — Other Ambulatory Visit: Payer: Self-pay

## 2012-08-30 ENCOUNTER — Encounter: Payer: Self-pay | Admitting: *Deleted

## 2012-08-30 ENCOUNTER — Encounter: Payer: 59 | Attending: Surgery | Admitting: *Deleted

## 2012-08-30 DIAGNOSIS — Z9884 Bariatric surgery status: Secondary | ICD-10-CM | POA: Insufficient documentation

## 2012-08-30 DIAGNOSIS — Z713 Dietary counseling and surveillance: Secondary | ICD-10-CM | POA: Insufficient documentation

## 2012-08-30 DIAGNOSIS — Z09 Encounter for follow-up examination after completed treatment for conditions other than malignant neoplasm: Secondary | ICD-10-CM | POA: Insufficient documentation

## 2012-08-30 NOTE — Progress Notes (Addendum)
  Follow-up visit:  8 Weeks Post-Operative LAGB Surgery  Medical Nutrition Therapy:  Appt start time: 1630   End time: 1700.  Primary concerns today: Post-operative Bariatric Surgery Nutrition Management.  Surgery date: 07/03/12 Surgery type: LAGB Revision Start weight at [facility]: 262.6 lbs  Weight today: 222.5 lbs Weight change: 1.3 lbs GAIN Total weight lost: 40.1 lbs  Weight goal: 180 lbs % goal met: 50%  TANITA  BODY COMP RESULTS  09/15/11 01/31/12 05/08/12 07/18/12 08/30/12  BMI (kg/m^2) 30.3 31.4 32.6 30.8 31.0  FM (lbs) 100.5 105.5 111.0 -- 105.0  FFM (lbs) 116.0 120.0 123.0 -- 117.5  TBW (lbs) 85.0 88.0 90.0 -- 86.0   24-hr recall:  B (AM): Protein Shake (Atkins or Unjury) Snk (AM): 4 oz Dannon Light & Fit + 1 tsp granola  L (PM): 3 oz grilled chicken on salad Snk (PM): String cheese  D (PM): 3-4 oz chili OR 3 oz grilled chicken and green beans Snk (PM): NONE  Fluid intake: 64 oz Estimated total protein intake: 60-75 g  Medications: Reviewed. 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 Has purchased, but not taking yet. Supplementation: Taking as directed  Checks BG:  Daily Average FBG per pt: 89-103 mg Last patient reported A1c:  6.3% (07/2012)   Using straws: No Drinking while eating: No Hair loss: No Carbonated beverages: No N/V/D/C:  None at time Last Lap-Band fill:  No fill yet.  Appt for Feb 12th rescheduled for March 3rd by [facility]. Feels she is in the yellow zone and wants a fill.  Recent physical activity:  Has appt with the BELT program. Needs clearance by Dr. [name].  Progress Towards Goal(s):  In progress.  Handouts given during visit include:  Phase 3B: High Protein + Non-Starchy Vegetables  Samples given during visit include:   Premier Protein trawberries & Cream): 1 bottle Lot: [id number]; Exp: 07/24/13   Nutritional Diagnosis:  [place]-3.3 Overweight/obesity related to past poor dietary habits and physical inactivity  as evidenced by patient w/ recent LAGB revision surgery following dietary guidelines for continued weight loss.    Intervention:  Nutrition education/diet advancement.  Monitoring/Evaluation:  Dietary intake, exercise, lap band fills, and body weight. Follow up in 2 months for 4 month post-op visit.

## 2012-08-30 NOTE — Patient Instructions (Addendum)
Goals:  Follow Phase 3B: High Protein + Non-Starchy Vegetables  Eat 3-6 small meals/snacks, every 3-5 hrs  Increase lean protein foods to meet 60-80g goal  Increase fluid intake to 64oz +  Avoid drinking 15 minutes before, during and 30 minutes after eating  Aim for >30 min of physical activity daily 

## 2012-09-11 ENCOUNTER — Ambulatory Visit (INDEPENDENT_AMBULATORY_CARE_PROVIDER_SITE_OTHER): Payer: Commercial Managed Care - PPO | Admitting: Surgery

## 2012-09-11 ENCOUNTER — Encounter (INDEPENDENT_AMBULATORY_CARE_PROVIDER_SITE_OTHER): Payer: Self-pay | Admitting: Surgery

## 2012-09-11 VITALS — BP 134/78 | HR 81 | Temp 97.4°F | Resp 16 | Ht 71.0 in | Wt 226.0 lb

## 2012-09-11 DIAGNOSIS — Z9884 Bariatric surgery status: Secondary | ICD-10-CM

## 2012-09-11 NOTE — Progress Notes (Signed)
Lapband Fill Encounter Problem List:   Patient Active Problem List  Diagnosis  . ONYCHOMYCOSIS, BILATERAL  . DIABETES MELLITUS, TYPE II  . VITAMIN B12 DEFICIENCY  . UNSPECIFIED VITAMIN D DEFICIENCY  . HYPERLIPIDEMIA NEC/NOS  . MORBID OBESITY  . OTHER SPECIFIED ANEMIAS  . ANXIETY  . DEPRESSION  . HYPERTENSION, ESSENTIAL NOS  . ACUTE SINUSITIS, UNSPECIFIED  . ACUTE PHARYNGITIS  . BRONCHITIS, ACUTE  . ALLERGIC RHINITIS  . CELLULITIS  . Pain in Joint, Multiple Sites  . BACK PAIN, LUMBAR  . Unspecified Myalgia and Myositis  . SKIN RASH  . HEADACHE  . COUGH  . NAUSEA  . DIARRHEA  . STRESS INCONTINENCE  . BARIATRIC SURGERY STATUS  . ABDOMINAL PAIN, ACUTE  . Lapband APL + [facility] repair June 2011-Major revision Dec 2013  . GERD (gastroesophageal reflux disease)    [name] Body mass index is 31.53 kg/(m^2). Weight loss since surgery  31.8   Having regurgitation?:  no  Feel that they need a fill?  yes  Nocturnal reflux?  no  Amount of fill  1   Port site: OK  Instructions given and weight loss goals discussed.    [name], MD, FACS

## 2012-09-11 NOTE — Patient Instructions (Signed)

## 2012-09-14 ENCOUNTER — Encounter (INDEPENDENT_AMBULATORY_CARE_PROVIDER_SITE_OTHER): Payer: Commercial Managed Care - PPO

## 2012-10-30 ENCOUNTER — Ambulatory Visit: Payer: 59 | Admitting: *Deleted

## 2012-10-30 ENCOUNTER — Encounter (INDEPENDENT_AMBULATORY_CARE_PROVIDER_SITE_OTHER): Payer: Self-pay

## 2012-10-31 ENCOUNTER — Ambulatory Visit (INDEPENDENT_AMBULATORY_CARE_PROVIDER_SITE_OTHER): Payer: Self-pay | Admitting: Family Medicine

## 2012-10-31 DIAGNOSIS — E119 Type 2 diabetes mellitus without complications: Secondary | ICD-10-CM

## 2012-10-31 NOTE — Progress Notes (Signed)
Patient presents today for DM follow-up as part of employer-sponsored Link to [employer]. Medications, glucose readings, a1c and compliance have been reviewed. I have also discussed with patient lifestyle interventions including diet and exercise. Details of the visit can be found in [employer] documenting program through [facility] [employer] [facility]). Patient has set a series of personal goals and will follow-up in no more than 3 months for further review of DM.   Today we specifically focused on her need to manage her care in the midst of stress and extra responsibility. Her recent commitment (12 weeks, starting 10/13/12) to the BELT program (cardio/core/strength training 3 hours a week) will hopefully accelerate weight loss. Today's a1c: 6.2%, weight 225lbs ("retaining extra fluid").

## 2012-11-02 ENCOUNTER — Encounter (INDEPENDENT_AMBULATORY_CARE_PROVIDER_SITE_OTHER): Payer: Self-pay | Admitting: Surgery

## 2012-11-02 ENCOUNTER — Ambulatory Visit (INDEPENDENT_AMBULATORY_CARE_PROVIDER_SITE_OTHER): Payer: Commercial Managed Care - PPO | Admitting: Surgery

## 2012-11-02 NOTE — Progress Notes (Signed)
Lapband Fill Encounter Problem List:   Patient Active Problem List  Diagnosis  . ONYCHOMYCOSIS, BILATERAL  . DIABETES MELLITUS, TYPE II  . VITAMIN B12 DEFICIENCY  . UNSPECIFIED VITAMIN D DEFICIENCY  . HYPERLIPIDEMIA NEC/NOS  . MORBID OBESITY  . OTHER SPECIFIED ANEMIAS  . ANXIETY  . DEPRESSION  . HYPERTENSION, ESSENTIAL NOS  . ACUTE SINUSITIS, UNSPECIFIED  . ACUTE PHARYNGITIS  . BRONCHITIS, ACUTE  . ALLERGIC RHINITIS  . CELLULITIS  . Pain in Joint, Multiple Sites  . BACK PAIN, LUMBAR  . Unspecified Myalgia and Myositis  . SKIN RASH  . HEADACHE  . COUGH  . NAUSEA  . DIARRHEA  . STRESS INCONTINENCE  . BARIATRIC SURGERY STATUS  . ABDOMINAL PAIN, ACUTE  . Lapband APL + [facility] repair June 2011-Major revision Dec 2013  . GERD (gastroesophageal reflux disease)    [name] Body mass index is 31.67 kg/(m^2). Weight loss since surgery  30  Having regurgitation?:  no  Feel that they need a fill?  yes  Nocturnal reflux?  no  Amount of fill  0.5   Port site: Easily accessed  Doing well.  Her mother in law just died.   Return 8 weeks

## 2012-11-02 NOTE — Patient Instructions (Signed)

## 2012-11-06 ENCOUNTER — Encounter: Payer: 59 | Attending: Surgery | Admitting: *Deleted

## 2012-11-06 ENCOUNTER — Encounter: Payer: Self-pay | Admitting: *Deleted

## 2012-11-06 DIAGNOSIS — Z09 Encounter for follow-up examination after completed treatment for conditions other than malignant neoplasm: Secondary | ICD-10-CM | POA: Insufficient documentation

## 2012-11-06 DIAGNOSIS — Z9884 Bariatric surgery status: Secondary | ICD-10-CM | POA: Insufficient documentation

## 2012-11-06 DIAGNOSIS — Z713 Dietary counseling and surveillance: Secondary | ICD-10-CM | POA: Insufficient documentation

## 2012-11-06 NOTE — Patient Instructions (Addendum)
Goals:  Follow Phase 3B: High Protein + Non-Starchy Vegetables  Continue intake of lean protein foods to meet 60-80g goal  Add 15 grams of carbohydrate (fruit, whole grain) with meals  Try to incorporate more fiber to help with constipation  Aim for >30 min of physical activity daily

## 2012-11-06 NOTE — Progress Notes (Signed)
  Follow-up visit:  4 Months Post-Operative LAGB Surgery  Medical Nutrition Therapy:  Appt start time:  0830   End time:  900.  Primary concerns today: Post-operative Bariatric Surgery Nutrition Management. [name] returns with 7.5 lb weight gain, though some is fluid. Reports mother-in-law died last week and she had been in [place] visiting her in hospice for 2 weeks prior. States she did the best she could with food choices. Otherwise, doing well.   Surgery date: 07/03/12 Surgery type: LAGB Revision [facility] weight at [facility]: 262.6 lbs  Weight today: 230.0 lbs Weight change: 7.5 lbs GAIN Total weight lost: 32.6 lbs  Weight goal: 180 lbs % goal met: 48%  TANITA  BODY COMP RESULTS  09/15/11 01/31/12 05/08/12 07/18/12 08/30/12 11/06/12  BMI (kg/m^2) 30.3 31.4 32.6 30.8 31.0 32.1  FM (lbs) 100.5 105.5 111.0 -- 105.0 110.5  FFM (lbs) 116.0 120.0 123.0 -- 117.5 119.5  TBW (lbs) 85.0 88.0 90.0 -- 86.0 87.5   24-hr recall:  B (AM): Protein Shake (Atkins or Unjury) Snk (AM): 4 oz Dannon Light & Fit + 1 tsp granola  L (PM): (3 oz) grilled chicken salad Snk (PM): String cheese  D (PM): 3-4 oz BBQ chicken breast OR 3 oz grilled chicken and green beans Snk (PM): NONE  Fluid intake:  > 64 oz Estimated total protein intake: 60-75 g  Medications: Reviewed.  Supplementation: Taking as directed  Checks BG:  Daily Average FBG per pt:  96 mg this morning; 1 episode of hypoglycemia (46 mg) after low-CHO shakes the day after last fill  Last patient reported A1c:  6.2% (11/02/2012)   Using straws: No Drinking while eating: No Hair loss: No Carbonated beverages: No N/V/D/C:  Constipation reported. Trying to add more greens Last Lap-Band fill:  11/02/12 - 0.5 cc added.  In the green zone right now.  Recent physical activity:  Started BELT program 10/13/12.  Plus 3-4 days week extra.   Progress Towards Goal(s):  In progress.   Nutritional Diagnosis:  [place]-3.3 Overweight/obesity related to past  poor dietary habits and physical inactivity as evidenced by patient w/ recent LAGB revision surgery attempting to follow dietary guidelines for continued weight loss.    Intervention:  Nutrition education/reinforcement.  Monitoring/Evaluation:  Dietary intake, exercise, lap band fills, and body weight. Follow up in 2 months for 6 month post-op visit.

## 2012-11-13 NOTE — Progress Notes (Signed)
Patient ID: [name], female   DOB: 13-Jul-1955, 57 y.o.   MRN: [id number] ATTENDING PHYSICIAN NOTE: I have reviewed the chart and agree with the plan as detailed above. Denny Levy MD Pager [phone]

## 2013-01-03 ENCOUNTER — Encounter: Payer: Self-pay | Admitting: Family Medicine

## 2013-01-03 ENCOUNTER — Ambulatory Visit (INDEPENDENT_AMBULATORY_CARE_PROVIDER_SITE_OTHER): Payer: 59 | Admitting: Family Medicine

## 2013-01-03 VITALS — BP 124/70 | HR 66 | Temp 98.3°F | Wt 230.0 lb

## 2013-01-03 DIAGNOSIS — F32A Depression, unspecified: Secondary | ICD-10-CM

## 2013-01-03 DIAGNOSIS — E785 Hyperlipidemia, unspecified: Secondary | ICD-10-CM

## 2013-01-03 DIAGNOSIS — F329 Major depressive disorder, single episode, unspecified: Secondary | ICD-10-CM

## 2013-01-03 DIAGNOSIS — F3289 Other specified depressive episodes: Secondary | ICD-10-CM

## 2013-01-03 DIAGNOSIS — E119 Type 2 diabetes mellitus without complications: Secondary | ICD-10-CM

## 2013-01-03 DIAGNOSIS — I10 Essential (primary) hypertension: Secondary | ICD-10-CM

## 2013-01-03 LAB — POCT URINALYSIS DIPSTICK
Bilirubin, UA: NEGATIVE
Blood, UA: NEGATIVE
Glucose, UA: NEGATIVE
Ketones, UA: NEGATIVE
Leukocytes, UA: NEGATIVE
Nitrite, UA: NEGATIVE
Protein, UA: NEGATIVE
Spec Grav, UA: 1.015
Urobilinogen, UA: 0.2
pH, UA: 6

## 2013-01-03 LAB — HEPATIC FUNCTION PANEL
ALT: 19 U/L (ref 0–35)
AST: 15 U/L (ref 0–37)
Albumin: 3.9 g/dL (ref 3.5–5.2)
Alkaline Phosphatase: 61 U/L (ref 39–117)
Bilirubin, Direct: 0 mg/dL (ref 0.0–0.3)
Total Bilirubin: 0.6 mg/dL (ref 0.3–1.2)
Total Protein: 7.6 g/dL (ref 6.0–8.3)

## 2013-01-03 LAB — BASIC METABOLIC PANEL
BUN: 15 mg/dL (ref 6–23)
CO2: 27 mEq/L (ref 19–32)
Calcium: 9.8 mg/dL (ref 8.4–10.5)
Chloride: 102 mEq/L (ref 96–112)
Creatinine, Ser: 0.8 mg/dL (ref 0.4–1.2)
GFR: 81.2 mL/min (ref >60.00)
Glucose, Bld: 92 mg/dL (ref 70–99)
Potassium: 4.4 mEq/L (ref 3.5–5.1)
Sodium: 137 mEq/L (ref 135–145)

## 2013-01-03 LAB — LIPID PANEL
Cholesterol: 138 mg/dL (ref 0–200)
HDL: 54.8 mg/dL (ref >39.00)
LDL Cholesterol: 66 mg/dL (ref 0–99)
Total CHOL/HDL Ratio: 3
Triglycerides: 86 mg/dL (ref 0.0–149.0)
VLDL: 17.2 mg/dL (ref 0.0–40.0)

## 2013-01-03 LAB — MICROALBUMIN / CREATININE URINE RATIO
Creatinine,U: 82.1 mg/dL
Microalb Creat Ratio: 0.2 mg/g (ref 0.0–30.0)
Microalb, Ur: 0.2 mg/dL (ref 0.0–1.9)

## 2013-01-03 LAB — HEMOGLOBIN A1C: Hgb A1c MFr Bld: 6.6 % — ABNORMAL HIGH (ref 4.6–6.5)

## 2013-01-03 MED ORDER — GLUCOSE BLOOD VI STRP: ORAL_STRIP | Status: DC

## 2013-01-03 MED ORDER — LIRAGLUTIDE 18 MG/3ML ~~LOC~~ SOPN: 1.2000 mg | PEN_INJECTOR | Freq: Every day | SUBCUTANEOUS | Status: DC

## 2013-01-03 MED ORDER — LANCETS MISC: Status: DC

## 2013-01-03 MED ORDER — INSULIN PEN NEEDLE 32G X 6 MM MISC: Status: DC

## 2013-01-03 MED ORDER — FUROSEMIDE 20 MG PO TABS: 20.0000 mg | ORAL_TABLET | Freq: Every day | ORAL | Status: DC | PRN

## 2013-01-03 MED ORDER — OLMESARTAN MEDOXOMIL 20 MG PO TABS: 10.0000 mg | ORAL_TABLET | Freq: Every day | ORAL | Status: DC

## 2013-01-03 MED ORDER — POTASSIUM CHLORIDE CRYS ER 10 MEQ PO TBCR: 10.0000 meq | EXTENDED_RELEASE_TABLET | Freq: Every day | ORAL | Status: DC

## 2013-01-03 MED ORDER — CITALOPRAM HYDROBROMIDE 20 MG PO TABS: 20.0000 mg | ORAL_TABLET | Freq: Every evening | ORAL | Status: DC

## 2013-01-03 NOTE — Assessment & Plan Note (Signed)
Check labs BS stable con't meds

## 2013-01-03 NOTE — Assessment & Plan Note (Signed)
Check labs con't meds 

## 2013-01-03 NOTE — Assessment & Plan Note (Signed)
Stable con't meds 

## 2013-01-03 NOTE — Patient Instructions (Addendum)
Diabetes and Standards of Medical Care  Diabetes is complicated. You may find that your diabetes team includes a dietitian, nurse, diabetes educator, eye doctor, and more. To help everyone know what is going on and to help you get the care you deserve, the following schedule of care was developed to help keep you on track. Below are the tests, exams, vaccines, medicines, education, and plans you will need. A1c test  Performed at least 2 times a year if you are meeting treatment goals.  Performed 4 times a year if therapy has changed or if you are not meeting treatment goals. Blood pressure test  Performed at every routine medical visit. The goal is less than 120/80 mmHg. Dental exam  Follow up with the dentist regularly. Eye exam  Diagnosed with type 1 diabetes as a child: Get an exam upon reaching the age of 10 years or older and having had diabetes for 3 5 years. Yearly eye exams are recommended after that initial eye exam.  Diagnosed with type 1 diabetes as an adult: Get an exam within 5 years of diagnosis and then yearly.  Diagnosed with type 2 diabetes: Get an exam as soon as possible after the diagnosis and then yearly. Foot care exam  Visual foot exams are performed at every routine medical visit. The exams check for cuts, injuries, or other problems with the feet.  A comprehensive foot exam should be done yearly. This includes visual inspection as well as assessing foot pulses and testing for loss of sensation. Kidney function test (urine microalbumin)  Performed once a year.  Type 1 diabetes: The first test is performed 5 years after diagnosis.  Type 2 diabetes: The first test is performed at the time of diagnosis.  A serum creatinine and estimated glomerular filtration rate (eGFR) test is done once a year to tell the level of chronic kidney disease (CKD), if present. Lipid profile (Cholesterol, HDL, LDL, Triglycerides)  Performed every 5 years for most people.  The  goal for LDL is less than 100 mg/dl. If at high risk, the goal is less than 70 mg/dl.  The goal for HDL is 40 mg/dl 50 mg/dl for men and 50 mg/dl 60 mg/dl for women. An HDL cholesterol of 60 mg/dL or higher gives some protection against heart disease.  The goal for triglycerides is less than 150 mg/dl. Influenza vaccine, pneumococcal vaccine, and hepatitis B vaccine  The influenza vaccine is recommended yearly.  The pneumococcal vaccine is generally given once in a lifetime. However, there are some instances when another vaccination is recommended. Check with your caregiver.  The hepatitis B vaccine is also recommended for adults with diabetes. Diabetes self-management education  Recommended at diagnosis and ongoing as needed. Treatment plan  Reviewed at every medical visit. Document Released: 04/25/2009 Document Revised: 06/14/2012 Document Reviewed: 12/29/2010 ExitCare Patient Information 2014 [employer].  

## 2013-01-03 NOTE — Assessment & Plan Note (Signed)
con't meds 

## 2013-01-03 NOTE — Progress Notes (Signed)
  Subjective:    Patient ID: [name], female    DOB: [birth year], 56 y.o.   MRN: [id number]  HPI HYPERTENSION Disease Monitoring Blood pressure range-good Chest pain- no      Dyspnea- no Medications Compliance- good Lightheadedness- no   Edema- no   DIABETES Disease Monitoring Blood Sugar ranges-67-107 Polyuria- no New Visual problems- no Medications Compliance- goo Hypoglycemic symptoms- no   HYPERLIPIDEMIA Disease Monitoring See symptoms for Hypertension Medications Compliance- good RUQ pain- no  Muscle aches- no  ROS See HPI above   PMH Smoking Status noted     Review of Systems As above    Objective:   Physical Exam  BP 124/70  Pulse 66  Temp(Src) 98.3 F (36.8 C) (Oral)  Wt 230 lb (104.327 kg)  BMI 32.09 kg/m2  SpO2 98% General appearance: alert, cooperative, appears stated age and no distress Neck: no adenopathy, no carotid bruit, no JVD, supple, symmetrical, trachea midline and thyroid not enlarged, symmetric, no tenderness/mass/nodules Lungs: clear to auscultation bilaterally Heart: S1, S2 normal Extremities: extremities normal, atraumatic, no cyanosis or edema Psych-- no anxiety, no depression Sensory exam of the foot is normal, tested with the monofilament. Good pulses, no lesions or ulcers, good peripheral pulses.       Assessment & Plan:

## 2013-01-04 ENCOUNTER — Encounter: Payer: Self-pay | Admitting: *Deleted

## 2013-01-04 ENCOUNTER — Ambulatory Visit (INDEPENDENT_AMBULATORY_CARE_PROVIDER_SITE_OTHER): Payer: Commercial Managed Care - PPO | Admitting: Surgery

## 2013-01-04 ENCOUNTER — Encounter (INDEPENDENT_AMBULATORY_CARE_PROVIDER_SITE_OTHER): Payer: Self-pay | Admitting: Surgery

## 2013-01-04 ENCOUNTER — Encounter: Payer: 59 | Attending: Surgery | Admitting: *Deleted

## 2013-01-04 VITALS — BP 140/74 | HR 78 | Temp 98.2°F | Resp 14 | Ht 71.0 in | Wt 228.8 lb

## 2013-01-04 DIAGNOSIS — Z9884 Bariatric surgery status: Secondary | ICD-10-CM

## 2013-01-04 DIAGNOSIS — Z09 Encounter for follow-up examination after completed treatment for conditions other than malignant neoplasm: Secondary | ICD-10-CM | POA: Insufficient documentation

## 2013-01-04 DIAGNOSIS — Z713 Dietary counseling and surveillance: Secondary | ICD-10-CM | POA: Insufficient documentation

## 2013-01-04 NOTE — Patient Instructions (Addendum)
Goals:  Follow Phase 3B: High Protein + Non-Starchy Vegetables  Continue intake of lean protein foods to meet 60-80g goal  Add 15 grams of carbohydrate (fruit, whole grains) with meals  Always have protein with carbs  Continue to incorporate more fiber to help with prevent constipation

## 2013-01-04 NOTE — Progress Notes (Addendum)
  Follow-up visit:  6 Months Post-Operative LAGB Surgery  Medical Nutrition Therapy:  Appt start time:  300   End time:  400.  Primary concerns today: Post-operative Bariatric Surgery Nutrition Management. Doing well in BELT program, though has been through extreme stress since last visit. Reports clothes are fitting looser. Had a fill today and reports being back in the green zone.   Surgery date: 07/03/12 Surgery type: LAGB Revision Start weight at [facility]: 262.6 lbs  Weight today: 226.0 lbs Weight change: - 4.0 lbs  Total weight lost:  36.6 lbs  Weight goal: 180 lbs % goal met: 44%  TANITA  BODY COMP RESULTS  09/15/11 01/31/12 05/08/12 07/18/12 08/30/12 10/13/12 (BELT) 11/06/12 12/20/12 (BELT) 01/04/13  BMI (kg/m^2) 30.3 31.4 32.6 30.8 31.0 32.0 32.1 30.8 31.5  FM (lbs) 100.5 105.5 111.0 -- 105.0 107.5 110.5 106.0 106.5  FFM (lbs) 116.0 120.0 123.0 -- 117.5 118.5 119.5 114.5 119.5  TBW (lbs) 85.0 88.0 90.0 -- 86.0 86.5 87.5 84.0 87.5   24-hr recall:  B (AM): Protein Shake (Atkins or Unjury) - 15g Snk (AM): 4 oz Dannon Light & Fit + 1 tsp granola - 12g  L (PM): (3 oz) grilled chicken salad - 20-25g Snk (PM): String cheese - 6g D (PM): Chili (wendy's small) OR 3 oz grilled chicken and green beans - 15-20g Snk (PM): NONE  Fluid intake:  > 64 oz Estimated total protein intake: 60-75 g  Medications: Reviewed.  Supplementation: Taking as directed  Checks BG:  Daily Last FBG per pt:  94 mg  Last 2hrPP per pt: 69 mg Last patient reported A1c:  6.6% (01/03/2013)   Using straws: No Drinking while eating: No Hair loss: No Carbonated beverages: No N/V/D/C:  Constipation resolving.  Last Lap-Band fill:  01/04/13 - 0.4 cc added.  In the green zone right now.  Recent physical activity:  Started BELT program 10/13/12.  Plus 3-4 days week extra.   Progress Towards Goal(s):  In progress.   Nutritional Diagnosis:  [place]-3.3 Overweight/obesity related to past poor dietary habits and physical  inactivity as evidenced by patient w/ recent LAGB revision surgery attempting to follow dietary guidelines for continued weight loss.    Intervention:  Nutrition education/reinforcement.  Monitoring/Evaluation:  Dietary intake, exercise, lap band fills, and body weight. Follow up in 3 months for 9 month post-op visit.

## 2013-01-04 NOTE — Progress Notes (Signed)
Lapband Fill Encounter Problem List:   Patient Active Problem List   Diagnosis Date Noted  . GERD (gastroesophageal reflux disease) 08/20/2011  . Lapband APL + [facility] repair June 2011-Major revision Dec 2013 06/29/2011  . ABDOMINAL PAIN, ACUTE 08/01/2010  . VITAMIN B12 DEFICIENCY 04/01/2010  . BARIATRIC SURGERY STATUS 01/29/2010  . ONYCHOMYCOSIS, BILATERAL 06/30/2009  . STRESS INCONTINENCE 06/10/2009  . ACUTE PHARYNGITIS 04/29/2009  . COUGH 04/27/2009  . BRONCHITIS, ACUTE 04/16/2009  . NAUSEA 04/08/2009  . DIARRHEA 04/08/2009  . MORBID OBESITY 03/03/2009  . SKIN RASH 11/18/2008  . UNSPECIFIED VITAMIN D DEFICIENCY 07/15/2008  . OTHER SPECIFIED ANEMIAS 07/15/2008  . Pain in Joint, Multiple Sites 06/13/2008  . Unspecified Myalgia and Myositis 06/13/2008  . BACK PAIN, LUMBAR 02/14/2008  . ACUTE SINUSITIS, UNSPECIFIED 09/01/2007  . CELLULITIS 08/12/2007  . ANXIETY 06/09/2007  . DEPRESSION 06/09/2007  . DIABETES MELLITUS, TYPE II 01/04/2007  . HYPERLIPIDEMIA NEC/NOS 01/04/2007  . HYPERTENSION, ESSENTIAL NOS 01/04/2007  . ALLERGIC RHINITIS 01/04/2007  . HEADACHE 01/04/2007    Telford Nab Koloski Body mass index is 31.93 kg/(m^2). Weight loss since surgery  29  Having regurgitation?:  no  Feel that they need a fill?  yes  Nocturnal reflux?  no  Amount of fill  0.4     Instructions given and weight loss goals discussed.    Doing well and is exercising.  [name], MD, FACS

## 2013-01-05 ENCOUNTER — Ambulatory Visit: Payer: 59 | Admitting: Family Medicine

## 2013-01-26 ENCOUNTER — Encounter: Payer: Self-pay | Admitting: *Deleted

## 2013-01-31 ENCOUNTER — Ambulatory Visit (INDEPENDENT_AMBULATORY_CARE_PROVIDER_SITE_OTHER): Payer: 59 | Admitting: Family Medicine

## 2013-01-31 DIAGNOSIS — E119 Type 2 diabetes mellitus without complications: Secondary | ICD-10-CM

## 2013-01-31 NOTE — Progress Notes (Signed)
Patient presents today for DM follow-up as part of employer-sponsored Link to Wellness Program. Medications, glucose readings, a1c and compliance have been reviewed. I have also discussed with patient lifestyle interventions including diet and exercise. Details of the visit can be found in [employer] documenting program through [facility] Patient has set a series of personal goals and will follow-up in no more than 3 months for further review of DM. 

## 2013-02-14 ENCOUNTER — Ambulatory Visit (INDEPENDENT_AMBULATORY_CARE_PROVIDER_SITE_OTHER): Payer: Commercial Managed Care - PPO | Admitting: Surgery

## 2013-02-14 ENCOUNTER — Encounter (INDEPENDENT_AMBULATORY_CARE_PROVIDER_SITE_OTHER): Payer: Self-pay | Admitting: Surgery

## 2013-02-14 VITALS — BP 120/70 | HR 76 | Temp 97.2°F | Resp 14 | Ht 71.0 in | Wt 225.0 lb

## 2013-02-14 DIAGNOSIS — Z9884 Bariatric surgery status: Secondary | ICD-10-CM

## 2013-02-14 NOTE — Patient Instructions (Signed)

## 2013-02-14 NOTE — Progress Notes (Signed)
[name] 57 y.o.  Body mass index is 31.39 kg/(m^2).  Patient Active Problem List   Diagnosis Date Noted  . GERD (gastroesophageal reflux disease) 08/20/2011  . Lapband APL + [facility] repair June 2011-Major revision Dec 2013 06/29/2011  . ABDOMINAL PAIN, ACUTE 08/01/2010  . VITAMIN B12 DEFICIENCY 04/01/2010  . BARIATRIC SURGERY STATUS 01/29/2010  . ONYCHOMYCOSIS, BILATERAL 06/30/2009  . STRESS INCONTINENCE 06/10/2009  . ACUTE PHARYNGITIS 04/29/2009  . COUGH 04/27/2009  . BRONCHITIS, ACUTE 04/16/2009  . NAUSEA 04/08/2009  . DIARRHEA 04/08/2009  . MORBID OBESITY 03/03/2009  . SKIN RASH 11/18/2008  . UNSPECIFIED VITAMIN D DEFICIENCY 07/15/2008  . OTHER SPECIFIED ANEMIAS 07/15/2008  . Pain in Joint, Multiple Sites 06/13/2008  . Unspecified Myalgia and Myositis 06/13/2008  . BACK PAIN, LUMBAR 02/14/2008  . ACUTE SINUSITIS, UNSPECIFIED 09/01/2007  . CELLULITIS 08/12/2007  . ANXIETY 06/09/2007  . DEPRESSION 06/09/2007  . DIABETES MELLITUS, TYPE II 01/04/2007  . HYPERLIPIDEMIA NEC/NOS 01/04/2007  . HYPERTENSION, ESSENTIAL NOS 01/04/2007  . ALLERGIC RHINITIS 01/04/2007  . HEADACHE 01/04/2007    Allergies  Allergen Reactions  . Isoniazid     Severe SOB  . Nitrofurantoin Shortness Of Breath    REACTION: whelps  . Hctz (Hydrochlorothiazide)     rash  . Promethazine Hcl (Promethazine Hcl)     IF GIVEN  IV-hallucinations     CAN TAKE PO PHENERGAN  . Macrolides And Ketolides     rash  . Morphine     REACTION: severe vomiting  . Percocet (Oxycodone-Acetaminophen)     REACTION: severe itching  . Sulfamethoxazole W-Trimethoprim     Septra / Bactrim  --REACTION: rash  . Fentanyl Hives and Rash    REACTION: whelps    Past Surgical History  Procedure Laterality Date  . Cesarean section  1989 & 1993    X 2  . Cervical laminectomy  2005 & 2009    X 2   NO ROM PROBLEMS  . Foot surgery  2005    RT HEEL  . Right knee  1981    ARTHROSCOPY AND ARTHROTOMY  . Nasal sinus surgery   1995 & 1997    X 2  . Laparoscopic gastric banding  12/30/09  . Esophagogastroduodenoscopy  06/29/2011    Procedure: ESOPHAGOGASTRODUODENOSCOPY (EGD);  Surgeon: Kandis Cocking, MD;  Location: [facility];  Service: General;  Laterality: N/A;  . Hysteroscopy  1999  . Laparoscopy  09/24/2011    Procedure: LAPAROSCOPY DIAGNOSTIC;  Surgeon: Valarie Merino, MD;  Location: [facility];  Service: General;  Laterality: N/A;  . Gastric banding port revision  09/24/2011    Procedure: GASTRIC BANDING PORT REVISION;  Surgeon: Valarie Merino, MD;  Location: [facility];  Service: General;  Laterality: N/A;  . Tonsillectomy  1971  . Abdominal hysterectomy  12/1999  . Cholecystectomy  1986  . Tubal ligation  1993    WITH C -SECTION  . Right knee arthroscopy and arthrotomy  12-1979  . Cervical conization w/bx  june 1990    dysplasia of cervix/used 5Fu cream for 3 months  . Laparoscopy  07/03/2012    Procedure: LAPAROSCOPY DIAGNOSTIC;  Surgeon: Valarie Merino, MD;  Location: [facility];  Service: General;  Laterality: N/A;  Exploratory Laparoscopy   . Laparoscopic revision of gastric band  07/03/2012    Procedure: LAPAROSCOPIC REVISION OF GASTRIC BAND;  Surgeon: Valarie Merino, MD;  Location: [facility];  Service: General;  Laterality: N/A;  removal of old lap. band port,  replaced with AP standard band  . Mesh applied to lap port  07/03/2012    Procedure: MESH APPLIED TO LAP PORT;  Surgeon: Valarie Merino, MD;  Location: [facility];  Service: General;  Laterality: N/A;  . Dilation and curettage of uterus  1989   Amy Lowne, DO No diagnosis found.  [name] came in today in followup. She is able to eat and drink without having reflux issues. She still getting very hungry.  When an adequate half cc to her band. Is able to drink without difficulty. We'll see her back in 6 weeks [name], MD, [facility] [phone] beeper [phone]  02/14/2013 5:42 PM

## 2013-02-19 NOTE — Progress Notes (Signed)
[name] drop-in:    [name]  BODY COMP RESULTS  09/15/11 01/31/12 05/08/12 07/18/12 08/30/12 10/13/12 (BELT) 11/06/12 12/20/12 (BELT) 01/04/13 01/24/13  BMI (kg/m^2) 30.3 31.4 32.6 30.8 31.0 32.0 32.1 30.8 31.5 31.9  FM (lbs) 100.5 105.5 111.0 -- 105.0 107.5 110.5 106.0 106.5 109.0  FFM (lbs) 116.0 120.0 123.0 -- 117.5 118.5 119.5 114.5 119.5 120.0  TBW (lbs) 85.0 88.0 90.0 -- 86.0 86.5 87.5 84.0 87.5 88.0

## 2013-03-06 ENCOUNTER — Ambulatory Visit: Payer: 59 | Admitting: *Deleted

## 2013-03-23 ENCOUNTER — Encounter: Payer: Self-pay | Admitting: *Deleted

## 2013-03-23 ENCOUNTER — Encounter: Payer: 59 | Attending: Surgery | Admitting: *Deleted

## 2013-03-23 DIAGNOSIS — Z09 Encounter for follow-up examination after completed treatment for conditions other than malignant neoplasm: Secondary | ICD-10-CM | POA: Insufficient documentation

## 2013-03-23 DIAGNOSIS — Z713 Dietary counseling and surveillance: Secondary | ICD-10-CM | POA: Insufficient documentation

## 2013-03-23 DIAGNOSIS — Z9884 Bariatric surgery status: Secondary | ICD-10-CM | POA: Insufficient documentation

## 2013-03-23 NOTE — Progress Notes (Signed)
  Follow-up visit:  9 Months Post-Operative LAGB Revision Surgery  Medical Nutrition Therapy:  Appt start time:  845   End time:  930.  Primary concerns today: Post-operative Bariatric Surgery Nutrition Management. Reports she can tolerate foods one day, then not the next (started 2 weeks ago). No change in food intake noted.  Very frustrated with the band overall at this point. Considering revision to RYGB or Sleeve, to which I'm inclined to agree. 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 Feeling discomfort in port site and having loose stools daily.    Surgery date: 07/03/12 Surgery type: LAGB Revision Start weight at [facility]: 262.6 lbs  Weight today: 227.0 lbs Weight change: + 1.0 lbs  Total weight lost:  35.6 lbs  Weight goal: 180 lbs % goal met: 43%  TANITA  BODY COMP RESULTS  09/15/11 01/31/12 05/08/12 07/18/12 LB revision on 07/03/12 08/30/12 10/13/12 (BELT) 11/06/12 12/20/12 (BELT) 01/04/13 01/24/13 03/23/13   BMI (kg/m^2) 30.3 31.4 32.6 30.8 31.0 32.0 32.1 30.8 31.5 31.9 31.7   FM (lbs) 100.5 105.5 111.0 -- 105.0 107.5 110.5 106.0 106.5 109.0 112.0   FFM (lbs) 116.0 120.0 123.0 -- 117.5 118.5 119.5 114.5 119.5 120.0 115.0   TBW (lbs) 85.0 88.0 90.0 -- 86.0 86.5 87.5 84.0 87.5 88.0 84.0   Fluid intake:  > 64 oz Estimated total protein intake: 60-75 g  Medications: Reviewed.  Supplementation: Taking as directed  Checks BG:  Daily Last FBG per pt:  86-116 mg  Last patient reported A1c:  6.6% (01/03/2013). New A1c scheduled for next month.   Using straws: No Drinking while eating: No Hair loss: No Carbonated beverages: No N/V/D/C:  Constipation resolved. Having loose stools.  Last Lap-Band fill:  02/14/13 - 0.5 cc added.  Did not really feel much of a change, though reports it did curb her appetite. Has f/u appt with Dr. [name] next week and plans to discuss revision to RYGB or Sleeve Gastrectomy. Feels she has tried everything and the band is  just not working for her.   Recent physical activity:  Has finished BELT program. No exercise last 2 weeks d/t working increased hours at work.    Progress Towards Goal(s):  In progress.   Nutritional Diagnosis:  [place]-3.3 Overweight/obesity related to past poor dietary habits and physical inactivity as evidenced by patient w/ recent LAGB revision surgery attempting to follow dietary guidelines for continued weight loss.    Intervention:  Nutrition education/reinforcement.  Monitoring/Evaluation:  Dietary intake, exercise, lap band fills, and body weight. Follow up in 3 months for 12 month post-op visit.

## 2013-03-23 NOTE — Patient Instructions (Addendum)
Goals:  Follow Phase 3B: High Protein + Non-Starchy Vegetables  Continue intake of lean protein foods to meet 60-80g goal  Continue fluid intake of 64-100 oz daily  Add 15 grams of carbohydrate (fruit, whole grains) with meals  Always have protein with carbs

## 2013-03-28 ENCOUNTER — Ambulatory Visit (INDEPENDENT_AMBULATORY_CARE_PROVIDER_SITE_OTHER): Payer: Commercial Managed Care - PPO | Admitting: Surgery

## 2013-03-28 ENCOUNTER — Encounter (INDEPENDENT_AMBULATORY_CARE_PROVIDER_SITE_OTHER): Payer: Self-pay | Admitting: Surgery

## 2013-03-28 VITALS — BP 124/64 | HR 72 | Temp 96.9°F | Resp 16 | Ht 71.0 in | Wt 226.4 lb

## 2013-03-28 DIAGNOSIS — Z9884 Bariatric surgery status: Secondary | ICD-10-CM

## 2013-03-28 NOTE — Patient Instructions (Signed)

## 2013-03-28 NOTE — Progress Notes (Signed)
Lapband Fill Encounter Problem List:   Patient Active Problem List   Diagnosis Date Noted  . GERD (gastroesophageal reflux disease) 08/20/2011  . Lapband APL + [facility] repair June 2011-Major revision Dec 2013 06/29/2011  . ABDOMINAL PAIN, ACUTE 08/01/2010  . VITAMIN B12 DEFICIENCY 04/01/2010  . BARIATRIC SURGERY STATUS 01/29/2010  . ONYCHOMYCOSIS, BILATERAL 06/30/2009  . STRESS INCONTINENCE 06/10/2009  . ACUTE PHARYNGITIS 04/29/2009  . COUGH 04/27/2009  . BRONCHITIS, ACUTE 04/16/2009  . NAUSEA 04/08/2009  . DIARRHEA 04/08/2009  . MORBID OBESITY 03/03/2009  . SKIN RASH 11/18/2008  . UNSPECIFIED VITAMIN D DEFICIENCY 07/15/2008  . OTHER SPECIFIED ANEMIAS 07/15/2008  . Pain in Joint, Multiple Sites 06/13/2008  . Unspecified Myalgia and Myositis 06/13/2008  . BACK PAIN, LUMBAR 02/14/2008  . ACUTE SINUSITIS, UNSPECIFIED 09/01/2007  . CELLULITIS 08/12/2007  . ANXIETY 06/09/2007  . DEPRESSION 06/09/2007  . DIABETES MELLITUS, TYPE II 01/04/2007  . HYPERLIPIDEMIA NEC/NOS 01/04/2007  . HYPERTENSION, ESSENTIAL NOS 01/04/2007  . ALLERGIC RHINITIS 01/04/2007  . HEADACHE 01/04/2007    [name] Body mass index is 31.59 kg/(m^2). Weight loss since surgery  31  Having regurgitation?:  no  Feel that they need a fill?  May be too tight  Nocturnal reflux?  no  Amount of fill  -0.25     Instructions given and weight loss goals discussed.    I think that she may be overfilled and is doing maladaptive eating.  I discussed conversion to a sleeve gastrectomy since she has plateaued and cannot seem to lose more weight.  Will explore conversion to sleeve and see back in 6 weeks.  In the meantime, I have talked with her about avoiding articificial sweeteners.    [name], MD, FACS

## 2013-05-02 ENCOUNTER — Other Ambulatory Visit: Payer: Self-pay | Admitting: *Deleted

## 2013-05-02 MED ORDER — LIRAGLUTIDE 18 MG/3ML ~~LOC~~ SOPN: 1.8000 mg | PEN_INJECTOR | Freq: Every day | SUBCUTANEOUS | Status: DC

## 2013-05-02 NOTE — Telephone Encounter (Signed)
Victoza refill sent to pharmacy. 

## 2013-05-03 ENCOUNTER — Ambulatory Visit (INDEPENDENT_AMBULATORY_CARE_PROVIDER_SITE_OTHER): Payer: Self-pay | Admitting: Family Medicine

## 2013-05-03 ENCOUNTER — Other Ambulatory Visit: Payer: Self-pay | Admitting: Obstetrics and Gynecology

## 2013-05-03 DIAGNOSIS — E119 Type 2 diabetes mellitus without complications: Secondary | ICD-10-CM

## 2013-05-03 DIAGNOSIS — Z1231 Encounter for screening mammogram for malignant neoplasm of breast: Secondary | ICD-10-CM

## 2013-05-03 NOTE — Progress Notes (Signed)
Patient presents today for DM follow-up as part of employer-sponsored [employer]. Medications, glucose readings, a1c and compliance have been reviewed. I have also discussed with patient lifestyle interventions including diet and exercise. Details of the visit can be found in [employer] documenting program through [employer] [facility]). Patient has set a series of personal goals and will follow-up in no more than 3 months for further review of DM.  Today's a1c = 5.9% Specifically talked about considering reduction of Victoza to 1.2 mg dose to help with persistent nausea and vomiting over the past 4-5 weeks.

## 2013-05-11 ENCOUNTER — Encounter (INDEPENDENT_AMBULATORY_CARE_PROVIDER_SITE_OTHER): Payer: Self-pay | Admitting: Surgery

## 2013-05-11 ENCOUNTER — Ambulatory Visit (INDEPENDENT_AMBULATORY_CARE_PROVIDER_SITE_OTHER): Payer: Commercial Managed Care - PPO | Admitting: Surgery

## 2013-05-11 VITALS — BP 140/74 | HR 84 | Resp 16 | Ht 71.0 in | Wt 229.0 lb

## 2013-05-11 DIAGNOSIS — Z9884 Bariatric surgery status: Secondary | ICD-10-CM

## 2013-05-11 NOTE — Progress Notes (Signed)
[name] came by today after work. The utilization review folks have never spoken with me and I do not have a phone number with which to reach him. I have given [name]  my cell phone number for them to reach me next week at [employer]esity Week in [place].    [name] has been not able to tolerate most foods. She's either been too tight or not restricted and I have failed it being able to get her in the green zone. She has been developing some maladaptive eating but despite this is managing to keep her hemoglobin A1c down.  Because of her intolerance she would like to proceed with band removal and hopefully either concomitant sleeve gastrectomy  or sleeve at a later date.  I will try to discuss this with the utilization review folks when I call.

## 2013-05-17 ENCOUNTER — Other Ambulatory Visit: Payer: Self-pay

## 2013-05-23 ENCOUNTER — Telehealth: Payer: Self-pay | Admitting: Dietician

## 2013-05-23 NOTE — Telephone Encounter (Signed)
[name] informed me today that she is still having difficulty tolerating most foods even though she has had the fluid removed from her band. She is eating just liquids and soft foods such as smoothies and other foods high in carbohydrates, which is not encouraging weight loss. States that she would like to have her lap band removed and have the sleeve gastrectomy performed instead. I will follow up with her on 06/21/13 during our planned appointment.

## 2013-06-04 ENCOUNTER — Ambulatory Visit (HOSPITAL_BASED_OUTPATIENT_CLINIC_OR_DEPARTMENT_OTHER)
Admission: RE | Admit: 2013-06-04 | Discharge: 2013-06-04 | Disposition: A | Discharge status: Home / Self Care | Payer: 59 | Source: Ambulatory Visit | Attending: Obstetrics and Gynecology | Admitting: Obstetrics and Gynecology

## 2013-06-04 DIAGNOSIS — Z1231 Encounter for screening mammogram for malignant neoplasm of breast: Secondary | ICD-10-CM | POA: Insufficient documentation

## 2013-06-13 ENCOUNTER — Encounter (HOSPITAL_COMMUNITY): Payer: Self-pay | Admitting: Pharmacy Technician

## 2013-06-14 ENCOUNTER — Encounter (INDEPENDENT_AMBULATORY_CARE_PROVIDER_SITE_OTHER): Payer: Self-pay | Admitting: Surgery

## 2013-06-14 ENCOUNTER — Ambulatory Visit (INDEPENDENT_AMBULATORY_CARE_PROVIDER_SITE_OTHER): Payer: Commercial Managed Care - PPO | Admitting: Surgery

## 2013-06-14 VITALS — BP 122/74 | HR 64 | Temp 97.8°F | Resp 16 | Ht 71.0 in | Wt 231.2 lb

## 2013-06-14 DIAGNOSIS — Z9884 Bariatric surgery status: Secondary | ICD-10-CM

## 2013-06-14 NOTE — Progress Notes (Signed)
Chief Complaint: Failure to maintain adequate weight loss with lapband  History of Present Illness: [name] is an 56 y.o. female that has an APL lapband placed with a posterior HH repair by my in 2011. She initially experienced good weight loss until she had an unusual URI that left her with a tender port and GER. Port revision and inspection was done in March but it failed to see any obvious problems with the lapband. Revision to an APS lapband did not produce any more success. She wants to have sleeve gastrectomy. She is aware that we may have to do this in two parts. Will proceed to OR on Dec 22.  Past Medical History   Diagnosis  Date   .  Allergic rhinitis    .  Menopause    .  GERD (gastroesophageal reflux disease)    .  IBS (irritable bowel syndrome)    .  Anxiety    .  Depression    .  Hypertension    .  Hyperlipemia    .  Asthma      hx bronchial asthma at times with upper resp infection   .  Headache      migraine and cluster headaches   .  Stress incontinence      at times   .  Diabetes mellitus type II      type 2    Past Surgical History   Procedure  Date   .  Cesarean section  1989 & 1993     X 2   .  Cervical laminectomy  2005 & 2009     X 2 NO ROM PROBLEMS   .  Foot surgery  2005     RT HEEL   .  Right knee  1981     ARTHROSCOPY AND ARTHROTOMY   .  Nasal sinus surgery  1995 & 1997     X 2   .  Laparoscopic gastric banding  12/30/09   .  Esophagogastroduodenoscopy  06/29/2011     Procedure: ESOPHAGOGASTRODUODENOSCOPY (EGD); Surgeon: David H Newman, MD; Location: [facility]; Service: General; Laterality: N/A;   .  Hysteroscopy  1999   .  Laparoscopy  09/24/2011     Procedure: LAPAROSCOPY DIAGNOSTIC; Surgeon: Reina Wilton B Christipher Rieger, MD; Location: [facility]S; Service: General; Laterality: N/A;   .  Gastric banding port revision  09/24/2011     Procedure: GASTRIC BANDING PORT REVISION; Surgeon: Wylan Gentzler B Ica Daye, MD; Location: [facility] ORS; Service: General; Laterality: N/A;    .  Tonsillectomy  1971   .  Abdominal hysterectomy  12/1999   .  Cholecystectomy  1986   .  Tubal ligation  1993     WITH C -SECTION   .  Right knee arthroscopy and arthrotomy  12-1979   .  Cervical conization w/bx  june 1990     dysplasia of cervix/used 5Fu cream for 3 months    Current Facility-Administered Medications   Medication  Dose  Route  Frequency  Provider  Last Rate  Last Dose   .  ceFAZolin (ANCEF) IVPB 2 g/50 mL premix  2 g  Intravenous  30 min Pre-Op  Meredith Jamieson Renz, RPH     .  lactated ringers infusion   Intravenous  Continuous  John R Germeroth, MD  100 mL/hr at 07/03/12 0910  1,000 mL at 07/03/12 0910   Isoniazid; Nitrofurantoin; Hctz; Promethazine hcl; Macrolides and ketolides; Morphine; Percocet; Sulfamethoxazole w-trimethoprim; and Fentanyl  Family History     Problem  Relation  Age of Onset   .  Diabetes  Mother    .  Stroke  Mother    .  Breast cancer  Mother    .  Atrial fibrillation  Mother    .  Hypertension  Mother    .  Cancer  Mother       breast    .  Diabetes  Father    .  Stroke  Father    .  Hypertension  Father    .  Heart attack  Father    .  Thyroid disease     .  Heart disease     Social History: reports that she has never smoked. She has never used smokeless tobacco. She reports that she does not drink alcohol or use illicit drugs.  REVIEW OF SYSTEMS - PERTINENT POSITIVES ONLY:  No DVT  Physical Exam:  Blood pressure 124/67, pulse 76, temperature 99.5 F (37.5 C), temperature source Oral, resp. rate 18, height 5' 11" (1.803 m), weight 225 lb 4 oz (102.173 kg), SpO2 99.00%.  Body mass index is 31.42 kg/(m^2).  Gen: WDWN WF NAD  Neurological: Alert and oriented to person, place, and time. Motor and sensory function is grossly intact  Head: Normocephalic and atraumatic.  Eyes: Conjunctivae are normal. Pupils are equal, round, and reactive to light. No scleral icterus.  Neck: Normal range of motion. Neck supple. No tracheal deviation  or thyromegaly present.  Cardiovascular: SR without murmurs or gallops. No carotid bruits  Respiratory: Effort normal. No respiratory distress. No chest wall tenderness. Breath sounds normal. No wheezes, rales or rhonchi.  Abdomen: nontender  GU:  Musculoskeletal: Normal range of motion. Extremities are nontender. No cyanosis, edema or clubbing noted Lymphadenopathy: No cervical, preauricular, postauricular or axillary adenopathy is present Skin: Skin is warm and dry. No rash noted. No diaphoresis. No erythema. No pallor. Pscyh: Normal mood and affect. Behavior is normal. Judgment and thought content normal.  LABORATORY RESULTS:  Results for orders placed during the hospital encounter of 07/03/12 (from the past 48 hour(s))   GLUCOSE, CAPILLARY Status: Abnormal    Collection Time    07/03/12 8:35 AM   Component  Value  Range  Comment    Glucose-Capillary  115 (*)  70 - 99 mg/dL    RADIOLOGY RESULTS:  No results found.  Problem List:  Patient Active Problem List   Diagnosis   .  ONYCHOMYCOSIS, BILATERAL   .  DIABETES MELLITUS, TYPE II   .  VITAMIN B12 DEFICIENCY   .  UNSPECIFIED VITAMIN D DEFICIENCY   .  HYPERLIPIDEMIA NEC/NOS   .  MORBID OBESITY   .  OTHER SPECIFIED ANEMIAS   .  ANXIETY   .  DEPRESSION   .  HYPERTENSION, ESSENTIAL NOS   .  ACUTE SINUSITIS, UNSPECIFIED   .  ACUTE PHARYNGITIS   .  BRONCHITIS, ACUTE   .  ALLERGIC RHINITIS   .  CELLULITIS   .  Pain in Joint, Multiple Sites   .  BACK PAIN, LUMBAR   .  Unspecified Myalgia and Myositis   .  SKIN RASH   .  HEADACHE   .  COUGH   .  NAUSEA   .  DIARRHEA   .  STRESS INCONTINENCE   .  BARIATRIC SURGERY STATUS   .  ABDOMINAL PAIN, ACUTE   .  Lapband APL + HH repair June 2011   .  GERD (gastroesophageal reflux disease)     Assessment & Plan:  Lapband removal and sleeve gastrectomy  [name] Kemara Quigley, MD, FACS  [facility]  [phone] beeper  [phone]   

## 2013-06-18 NOTE — Progress Notes (Signed)
Dr [name]-  PLEASE PUT IN PRE OP ORDERS.  Has appt at P[facility] 06/25/13 and labs, ekg will be done there.  Thank you

## 2013-06-19 NOTE — Progress Notes (Signed)
Dr Okey Dupre ordered scopolamine patch per patient preop.    Order placed in EPIC.

## 2013-06-20 ENCOUNTER — Telehealth (INDEPENDENT_AMBULATORY_CARE_PROVIDER_SITE_OTHER): Payer: Self-pay | Admitting: *Deleted

## 2013-06-20 ENCOUNTER — Other Ambulatory Visit (INDEPENDENT_AMBULATORY_CARE_PROVIDER_SITE_OTHER): Payer: Self-pay | Admitting: *Deleted

## 2013-06-20 DIAGNOSIS — Z9884 Bariatric surgery status: Secondary | ICD-10-CM

## 2013-06-20 NOTE — Telephone Encounter (Signed)
Patient called to ask if there is any way Dr. [name] would be willing to order in addition to the CBC and CMET a A1C, Hepatic panel, and urine.  Patient needs labs done from PMD as well so she is hoping to just be able to get them all done at one place.  Explained to patient that I will send a message to ask Dr. [name] if he would be willing to do this then we will let her know.  Patient states understanding and agreeable at this time.

## 2013-06-20 NOTE — Telephone Encounter (Signed)
Dr. [name] in office stated it was fine to go ahead with the below orders.  Patient updated that labs entered.  Patient states understanding.

## 2013-06-21 ENCOUNTER — Encounter: Payer: 59 | Attending: Surgery | Admitting: Dietician

## 2013-06-21 DIAGNOSIS — Z09 Encounter for follow-up examination after completed treatment for conditions other than malignant neoplasm: Secondary | ICD-10-CM | POA: Insufficient documentation

## 2013-06-21 DIAGNOSIS — Z9884 Bariatric surgery status: Secondary | ICD-10-CM | POA: Insufficient documentation

## 2013-06-21 DIAGNOSIS — Z713 Dietary counseling and surveillance: Secondary | ICD-10-CM | POA: Insufficient documentation

## 2013-06-21 NOTE — Progress Notes (Signed)
  Follow-up visit:  12 Months Post-Operative LAGB Revision Surgery  Medical Nutrition Therapy:  Appt start time:  800   End time:  830.  Primary concerns today: Post-operative Bariatric Surgery Nutrition Management. Dedria is having her lap band removed on 12/22 and is hoping to have a conversion to a sleeve gastrectomy. Can tolerate soft foods only. Cannot tolerate anything that is not soft.   Surgery date: 07/03/12 Surgery type: LAGB Revision [facility] weight at [facility]: 262.6 lbs  Weight today: 235.0 lbs Weight change: + 8.0 lbs  Total weight lost:  27.6 lbs  Weight goal: 180 lbs % goal met: 43%  TANITA  BODY COMP RESULTS  09/15/11 01/31/12 05/08/12 07/18/12 LB revision on 07/03/12 08/30/12 10/13/12 (BELT) 11/06/12 12/20/12 (BELT) 01/04/13 01/24/13 03/23/13 06/21/13   BMI (kg/m^2) 30.3 31.4 32.6 30.8 31.0 32.0 32.1 30.8 31.5 31.9 31.7 32.8   FM (lbs) 100.5 105.5 111.0 -- 105.0 107.5 110.5 106.0 106.5 109.0 112.0 116.5   FFM (lbs) 116.0 120.0 123.0 -- 117.5 118.5 119.5 114.5 119.5 120.0 115.0 118.5   TBW (lbs) 85.0 88.0 90.0 -- 86.0 86.5 87.5 84.0 87.5 88.0 84.0 87.0   Fluid intake:  > 64 oz Estimated total protein intake: 60-75 g  Medications: Reviewed.  Supplementation: Taking as directed  Checks BG:  Daily Last FBG per pt:  86-116 mg  Last patient reported A1c:  6.6% (01/03/2013). New A1c scheduled for next month.   Using straws: No Drinking while eating: No Hair loss: No Carbonated beverages: No N/V/D/C:  Constipation resolved. Having loose stools.  Last Lap-Band fill:  Took all fluid out in September  Recent physical activity:  Has finished BELT program. No exercise last 2 weeks d/t working increased hours at work.    Progress Towards Goal(s):  In progress.   Nutritional Diagnosis:  Sallis-3.3 Overweight/obesity related to past poor dietary habits and physical inactivity as evidenced by patient w/ recent LAGB revision surgery attempting to follow dietary guidelines for continued  weight loss.    Intervention:  Nutrition education/reinforcement. Reviewed the Pre-Op Diet and planned out which protein and vegetables she could have to meet the needs of the diet and she could tolerate.   Handouts given during appointment:  Pre-Op Diet  Gastric Sleeve Post Op Diet Supplements given during appointment:  2 Unjury Unflavored, lot # [id number] exp 12/15  2 Unjury chicken broth, lot # [id number] exp 12/15  Monitoring/Evaluation:  Dietary intake, exercise, lap band fills, and body weight. Follow up in  4 weeks for Post Op class.

## 2013-06-21 NOTE — Telephone Encounter (Signed)
Pt called asking for Dr [name] to put in pre op orders for [facility] and EKG. 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 Pt can be reached at [phone]. I advised her I will send request to Dr [name].

## 2013-06-21 NOTE — Progress Notes (Signed)
Need orders in EPIC.  Surgery scheduled for 07/02/13.  Thank You.

## 2013-06-21 NOTE — Patient Instructions (Signed)
Goals:  Follow Pre-Op Diet until surgery  For protein, try ground lean meat, yogurt, baked flounder, pureed eggs with low fat mayo. If having difficulty getting in 3 oz (21 gram of protein) during meals, try adding unflavored protein or chicken broth protein to beans.   For vegetables, have cooked soft vegetables such as tomatoes, squash, cucumbers, onions, spinach, carrots, and/or pureered cauliflower.  Have 3 protein shakes per day.  Drink 64 oz of unsweetened fluid per day.

## 2013-06-22 ENCOUNTER — Other Ambulatory Visit: Payer: Self-pay

## 2013-06-22 ENCOUNTER — Other Ambulatory Visit (INDEPENDENT_AMBULATORY_CARE_PROVIDER_SITE_OTHER): Payer: Self-pay | Admitting: Surgery

## 2013-06-22 ENCOUNTER — Ambulatory Visit (HOSPITAL_COMMUNITY)
Admission: RE | Admit: 2013-06-22 | Discharge: 2013-06-22 | Disposition: A | Discharge status: Home / Self Care | Payer: 59 | Source: Ambulatory Visit | Attending: Surgery | Admitting: Surgery

## 2013-06-22 ENCOUNTER — Encounter (HOSPITAL_COMMUNITY)
Admission: RE | Admit: 2013-06-22 | Discharge: 2013-06-22 | Disposition: A | Discharge status: Home / Self Care | Payer: 59 | Source: Ambulatory Visit | Attending: Surgery | Admitting: Surgery

## 2013-06-22 ENCOUNTER — Ambulatory Visit: Payer: 59 | Admitting: *Deleted

## 2013-06-22 DIAGNOSIS — I10 Essential (primary) hypertension: Secondary | ICD-10-CM | POA: Insufficient documentation

## 2013-06-22 DIAGNOSIS — R9431 Abnormal electrocardiogram [ECG] [EKG]: Secondary | ICD-10-CM | POA: Insufficient documentation

## 2013-06-22 DIAGNOSIS — Z981 Arthrodesis status: Secondary | ICD-10-CM | POA: Insufficient documentation

## 2013-06-22 DIAGNOSIS — Z01810 Encounter for preprocedural cardiovascular examination: Secondary | ICD-10-CM | POA: Insufficient documentation

## 2013-06-22 DIAGNOSIS — E119 Type 2 diabetes mellitus without complications: Secondary | ICD-10-CM | POA: Insufficient documentation

## 2013-06-22 DIAGNOSIS — M5380 Other specified dorsopathies, site unspecified: Secondary | ICD-10-CM | POA: Insufficient documentation

## 2013-06-22 DIAGNOSIS — Z01818 Encounter for other preprocedural examination: Secondary | ICD-10-CM | POA: Insufficient documentation

## 2013-06-22 NOTE — Patient Instructions (Addendum)
20 KEERTHI HAZELL  06/22/2013   Your procedure is scheduled on: Monday December 22nd  Report to [facility] at 515  AM.  Call this number if you have problems the morning of surgery [phone]   Remember:   Do not eat food or drink liquids :After Midnight.     Take these medicines the morning of surgery with A SIP OF WATER: no medications to take                                [facility] PREPARING FOR SURGERY SHEET             You may not have any metal on your body including hair pins and piercings  Do not wear jewelry, make-up.  Do not wear lotions, powders, or perfumes. You may wear deodorant.   Men may shave face and neck.  Do not bring valuables to the hospital. [facility] IS NOT RESPONSIBLE FOR VALUEABLES.  Contacts, dentures or bridgework may not be worn into surgery.  Leave suitcase in the car. After surgery it may be brought to your room.  For patients admitted to the hospital, checkout time is 11:00 AM the day of discharge.     Please read over the following fact sheets that you were given: Incentive spirometer sheet  Call Cain Sieve RN pre op nurse if needed [phone]    FAILURE TO FOLLOW THESE INSTRUCTIONS MAY RESULT IN THE CANCELLATION OF YOUR SURGERY.  PATIENT SIGNATURE___________________________________________  NURSE SIGNATURE_____________________________________________

## 2013-06-25 ENCOUNTER — Encounter: Payer: Self-pay | Admitting: Family Medicine

## 2013-06-25 ENCOUNTER — Other Ambulatory Visit: Payer: Self-pay

## 2013-06-25 ENCOUNTER — Ambulatory Visit (INDEPENDENT_AMBULATORY_CARE_PROVIDER_SITE_OTHER): Payer: 59 | Admitting: Family Medicine

## 2013-06-25 ENCOUNTER — Other Ambulatory Visit (HOSPITAL_COMMUNITY): Payer: Self-pay | Admitting: Surgery

## 2013-06-25 ENCOUNTER — Encounter (HOSPITAL_COMMUNITY): Payer: Self-pay

## 2013-06-25 VITALS — BP 118/72 | HR 71 | Temp 98.6°F | Wt 230.0 lb

## 2013-06-25 DIAGNOSIS — B351 Tinea unguium: Secondary | ICD-10-CM

## 2013-06-25 DIAGNOSIS — E785 Hyperlipidemia, unspecified: Secondary | ICD-10-CM

## 2013-06-25 DIAGNOSIS — E1159 Type 2 diabetes mellitus with other circulatory complications: Secondary | ICD-10-CM

## 2013-06-25 DIAGNOSIS — F329 Major depressive disorder, single episode, unspecified: Secondary | ICD-10-CM

## 2013-06-25 DIAGNOSIS — K912 Postsurgical malabsorption, not elsewhere classified: Secondary | ICD-10-CM

## 2013-06-25 DIAGNOSIS — E669 Obesity, unspecified: Secondary | ICD-10-CM | POA: Insufficient documentation

## 2013-06-25 DIAGNOSIS — F3289 Other specified depressive episodes: Secondary | ICD-10-CM

## 2013-06-25 DIAGNOSIS — F32A Depression, unspecified: Secondary | ICD-10-CM

## 2013-06-25 DIAGNOSIS — I10 Essential (primary) hypertension: Secondary | ICD-10-CM

## 2013-06-25 DIAGNOSIS — Z9884 Bariatric surgery status: Secondary | ICD-10-CM

## 2013-06-25 DIAGNOSIS — E119 Type 2 diabetes mellitus without complications: Secondary | ICD-10-CM

## 2013-06-25 LAB — LIPID PANEL
Cholesterol: 129 mg/dL (ref 0–200)
HDL: 44 mg/dL (ref >39.00)
LDL Cholesterol: 69 mg/dL (ref 0–99)
Total CHOL/HDL Ratio: 3
Triglycerides: 82 mg/dL (ref 0.0–149.0)
VLDL: 16.4 mg/dL (ref 0.0–40.0)

## 2013-06-25 LAB — CBC WITH DIFFERENTIAL/PLATELET
Basophils Absolute: 0.1 10*3/uL (ref 0.0–0.1)
Basophils Relative: 0.7 % (ref 0.0–3.0)
Eosinophils Absolute: 0.2 10*3/uL (ref 0.0–0.7)
Eosinophils Relative: 2.6 % (ref 0.0–5.0)
HCT: 40 % (ref 36.0–46.0)
Hemoglobin: 13.3 g/dL (ref 12.0–15.0)
Lymphocytes Relative: 27.1 % (ref 12.0–46.0)
Lymphs Abs: 2.5 10*3/uL (ref 0.7–4.0)
MCHC: 33.1 g/dL (ref 30.0–36.0)
MCV: 81.8 fl (ref 78.0–100.0)
Monocytes Absolute: 0.7 10*3/uL (ref 0.1–1.0)
Monocytes Relative: 7.4 % (ref 3.0–12.0)
Neutro Abs: 5.8 10*3/uL (ref 1.4–7.7)
Neutrophils Relative %: 62.2 % (ref 43.0–77.0)
Platelets: 277 10*3/uL (ref 150.0–400.0)
RBC: 4.9 Mil/uL (ref 3.87–5.11)
RDW: 14.4 % (ref 11.5–14.6)
WBC: 9.4 10*3/uL (ref 4.5–10.5)

## 2013-06-25 LAB — HEPATIC FUNCTION PANEL
ALT: 20 U/L (ref 0–35)
AST: 19 U/L (ref 0–37)
Albumin: 4.3 g/dL (ref 3.5–5.2)
Alkaline Phosphatase: 65 U/L (ref 39–117)
Bilirubin, Direct: 0 mg/dL (ref 0.0–0.3)
Total Bilirubin: 1 mg/dL (ref 0.3–1.2)
Total Protein: 7.8 g/dL (ref 6.0–8.3)

## 2013-06-25 LAB — BASIC METABOLIC PANEL
BUN: 14 mg/dL (ref 6–23)
CO2: 30 mEq/L (ref 19–32)
Calcium: 9.8 mg/dL (ref 8.4–10.5)
Chloride: 102 mEq/L (ref 96–112)
Creatinine, Ser: 0.9 mg/dL (ref 0.4–1.2)
GFR: 67 mL/min (ref >60.00)
Glucose, Bld: 104 mg/dL — ABNORMAL HIGH (ref 70–99)
Potassium: 4 mEq/L (ref 3.5–5.1)
Sodium: 140 mEq/L (ref 135–145)

## 2013-06-25 LAB — HEMOGLOBIN A1C: Hgb A1c MFr Bld: 6.4 % (ref 4.6–6.5)

## 2013-06-25 LAB — TSH: TSH: 2.35 u[IU]/mL (ref 0.35–5.50)

## 2013-06-25 LAB — MICROALBUMIN / CREATININE URINE RATIO
Creatinine,U: 242.9 mg/dL
Microalb Creat Ratio: 0.2 mg/g (ref 0.0–30.0)
Microalb, Ur: 0.5 mg/dL (ref 0.0–1.9)

## 2013-06-25 LAB — VITAMIN B12: Vitamin B-12: 455 pg/mL (ref 211–911)

## 2013-06-25 MED ORDER — LIRAGLUTIDE 18 MG/3ML ~~LOC~~ SOPN: 1.8000 mg | PEN_INJECTOR | Freq: Every day | SUBCUTANEOUS | Status: DC

## 2013-06-25 MED ORDER — OLMESARTAN MEDOXOMIL 20 MG PO TABS: 10.0000 mg | ORAL_TABLET | Freq: Every day | ORAL | Status: DC

## 2013-06-25 MED ORDER — ZOLPIDEM TARTRATE 5 MG PO TABS: 5.0000 mg | ORAL_TABLET | Freq: Every evening | ORAL | Status: DC | PRN

## 2013-06-25 MED ORDER — ROSUVASTATIN CALCIUM 20 MG PO TABS: 20.0000 mg | ORAL_TABLET | Freq: Every day | ORAL | Status: DC

## 2013-06-25 MED ORDER — POTASSIUM CHLORIDE CRYS ER 10 MEQ PO TBCR: 10.0000 meq | EXTENDED_RELEASE_TABLET | Freq: Every day | ORAL | Status: DC

## 2013-06-25 MED ORDER — FUROSEMIDE 20 MG PO TABS: 20.0000 mg | ORAL_TABLET | Freq: Every morning | ORAL | Status: DC

## 2013-06-25 MED ORDER — GLUCOSE BLOOD VI STRP: ORAL_STRIP | Status: DC

## 2013-06-25 MED ORDER — CITALOPRAM HYDROBROMIDE 20 MG PO TABS: 20.0000 mg | ORAL_TABLET | Freq: Every evening | ORAL | Status: DC

## 2013-06-25 NOTE — Telephone Encounter (Signed)
Xanax is not on the medication list please advise on dose and quantity      KP

## 2013-06-25 NOTE — Progress Notes (Signed)
Pre visit review using our clinic review tool, if applicable. No additional management support is needed unless otherwise documented below in the visit note. 

## 2013-06-25 NOTE — Progress Notes (Signed)
   Subjective:    Patient ID: [name], female    DOB: April 22, 1956, 57 y.o.   MRN: [id number]  HPI  HPI HYPERTENSION  Blood pressure range-good  Chest pain- no      Dyspnea- no Lightheadedness- no   Edema- no Other side effects - no   Medication compliance: no Low salt diet- yes  DIABETES  Blood Sugar ranges-running low  Polyuria- no New Visual problems- no Hypoglycemic symptoms- yes Other side effects-no Medication compliance - no Last eye exam- 01/2013 Foot exam- today  HYPERLIPIDEMIA  Medication compliance- good RUQ pain- no  Muscle aches- no Other side effects-no  ROS See HPI above   PMH Smoking Status noted       Review of Systems    as above Objective:   Physical Exam BP 118/72  Pulse 71  Temp(Src) 98.6 F (37 C) (Oral)  Wt 230 lb (104.327 kg)  SpO2 97% General appearance: alert, cooperative, appears stated age and no distress Nose: Nares normal. Septum midline. Mucosa normal. No drainage or sinus tenderness. Throat: lips, mucosa, and tongue normal; teeth and gums normal Neck: no adenopathy, no carotid bruit, no JVD, supple, symmetrical, trachea midline and thyroid not enlarged, symmetric, no tenderness/mass/nodules Lungs: clear to auscultation bilaterally Heart: S1, S2 normal Extremities: extremities normal, atraumatic, no cyanosis or edema Psych-- no depression, no anxiety       Assessment & Plan:

## 2013-06-25 NOTE — Patient Instructions (Signed)

## 2013-06-25 NOTE — Assessment & Plan Note (Signed)
Check labs Cont' meds 

## 2013-06-25 NOTE — Assessment & Plan Note (Signed)
Pt having band removed Monday and sleeve done

## 2013-06-25 NOTE — Assessment & Plan Note (Signed)
con't meds 

## 2013-06-25 NOTE — Assessment & Plan Note (Signed)
con't meds  Check labs 

## 2013-06-25 NOTE — Assessment & Plan Note (Signed)
Check labs stable 

## 2013-06-25 NOTE — Progress Notes (Signed)
Cbc with di, bmet, hepatic function panel, hemaglobin A1 c, microalbumni, lipid panel, tsh, vitamin B12, vitamin d 06-25-13 epic

## 2013-06-27 LAB — POCT URINALYSIS DIPSTICK
Bilirubin, UA: NEGATIVE
Blood, UA: NEGATIVE
Glucose, UA: NEGATIVE
Ketones, UA: NEGATIVE
Leukocytes, UA: NEGATIVE
Nitrite, UA: NEGATIVE
Protein, UA: NEGATIVE
Spec Grav, UA: 1.01
Urobilinogen, UA: 0.2
pH, UA: 6.5

## 2013-06-27 NOTE — Progress Notes (Signed)
barimax bed ordered with trapeze per christy portable equipment

## 2013-06-28 LAB — VITAMIN D 1,25 DIHYDROXY
Vitamin D 1, 25 (OH)2 Total: 30 pg/mL (ref 18–72)
Vitamin D2 1, 25 (OH)2: <8 pg/mL
Vitamin D3 1, 25 (OH)2: 30 pg/mL

## 2013-06-29 ENCOUNTER — Encounter (HOSPITAL_COMMUNITY)
Admission: RE | Admit: 2013-06-29 | Discharge: 2013-06-29 | Disposition: A | Discharge status: Home / Self Care | Payer: 59 | Source: Ambulatory Visit | Attending: Surgery | Admitting: Surgery

## 2013-06-29 DIAGNOSIS — Z01812 Encounter for preprocedural laboratory examination: Secondary | ICD-10-CM | POA: Insufficient documentation

## 2013-06-29 LAB — SURGICAL PCR SCREEN
MRSA, PCR: NEGATIVE
Staphylococcus aureus: NEGATIVE

## 2013-07-02 ENCOUNTER — Encounter (HOSPITAL_COMMUNITY): Payer: 59 | Admitting: Registered Nurse

## 2013-07-02 ENCOUNTER — Inpatient Hospital Stay (HOSPITAL_COMMUNITY): Payer: 59 | Admitting: Registered Nurse

## 2013-07-02 ENCOUNTER — Inpatient Hospital Stay (HOSPITAL_COMMUNITY)
Admission: RE | Admit: 2013-07-02 | Discharge: 2013-07-04 | DRG: 621 | Disposition: A | Discharge status: Home / Self Care | Payer: 59 | Source: Ambulatory Visit | Attending: Surgery | Admitting: Surgery

## 2013-07-02 ENCOUNTER — Encounter (HOSPITAL_COMMUNITY)
Admission: RE | Disposition: A | Discharge status: Home / Self Care | Payer: Self-pay | Source: Ambulatory Visit | Attending: Surgery

## 2013-07-02 ENCOUNTER — Encounter (HOSPITAL_COMMUNITY): Payer: Self-pay | Admitting: *Deleted

## 2013-07-02 DIAGNOSIS — I10 Essential (primary) hypertension: Secondary | ICD-10-CM | POA: Diagnosis present

## 2013-07-02 DIAGNOSIS — Z6832 Body mass index (BMI) 32.0-32.9, adult: Secondary | ICD-10-CM

## 2013-07-02 DIAGNOSIS — M2550 Pain in unspecified joint: Secondary | ICD-10-CM

## 2013-07-02 DIAGNOSIS — Z8249 Family history of ischemic heart disease and other diseases of the circulatory system: Secondary | ICD-10-CM

## 2013-07-02 DIAGNOSIS — F3289 Other specified depressive episodes: Secondary | ICD-10-CM | POA: Diagnosis present

## 2013-07-02 DIAGNOSIS — Z9884 Bariatric surgery status: Secondary | ICD-10-CM

## 2013-07-02 DIAGNOSIS — Z6831 Body mass index (BMI) 31.0-31.9, adult: Secondary | ICD-10-CM

## 2013-07-02 DIAGNOSIS — Z803 Family history of malignant neoplasm of breast: Secondary | ICD-10-CM

## 2013-07-02 DIAGNOSIS — IMO0001 Reserved for inherently not codable concepts without codable children: Secondary | ICD-10-CM | POA: Diagnosis present

## 2013-07-02 DIAGNOSIS — E119 Type 2 diabetes mellitus without complications: Secondary | ICD-10-CM | POA: Diagnosis present

## 2013-07-02 DIAGNOSIS — F329 Major depressive disorder, single episode, unspecified: Secondary | ICD-10-CM | POA: Diagnosis present

## 2013-07-02 DIAGNOSIS — Z01812 Encounter for preprocedural laboratory examination: Secondary | ICD-10-CM

## 2013-07-02 DIAGNOSIS — Z823 Family history of stroke: Secondary | ICD-10-CM

## 2013-07-02 DIAGNOSIS — Z833 Family history of diabetes mellitus: Secondary | ICD-10-CM

## 2013-07-02 DIAGNOSIS — J45909 Unspecified asthma, uncomplicated: Secondary | ICD-10-CM | POA: Diagnosis present

## 2013-07-02 DIAGNOSIS — N393 Stress incontinence (female) (male): Secondary | ICD-10-CM | POA: Diagnosis present

## 2013-07-02 DIAGNOSIS — F411 Generalized anxiety disorder: Secondary | ICD-10-CM | POA: Diagnosis present

## 2013-07-02 DIAGNOSIS — R51 Headache: Secondary | ICD-10-CM | POA: Diagnosis present

## 2013-07-02 DIAGNOSIS — K219 Gastro-esophageal reflux disease without esophagitis: Secondary | ICD-10-CM | POA: Diagnosis present

## 2013-07-02 DIAGNOSIS — E785 Hyperlipidemia, unspecified: Secondary | ICD-10-CM | POA: Diagnosis present

## 2013-07-02 HISTORY — PX: LAPAROSCOPIC REPAIR AND REMOVAL OF GASTRIC BAND: SHX5919

## 2013-07-02 HISTORY — PX: LAPAROSCOPIC GASTRIC SLEEVE RESECTION: SHX5895

## 2013-07-02 LAB — GLUCOSE, CAPILLARY
Glucose-Capillary: 100 mg/dL — ABNORMAL HIGH (ref 70–99)
Glucose-Capillary: 143 mg/dL — ABNORMAL HIGH (ref 70–99)
Glucose-Capillary: 166 mg/dL — ABNORMAL HIGH (ref 70–99)
Glucose-Capillary: 171 mg/dL — ABNORMAL HIGH (ref 70–99)
Glucose-Capillary: 185 mg/dL — ABNORMAL HIGH (ref 70–99)

## 2013-07-02 LAB — CREATININE, SERUM
Creatinine, Ser: 0.9 mg/dL (ref 0.50–1.10)
GFR calc Af Amer: 81 mL/min — ABNORMAL LOW (ref >90)
GFR calc non Af Amer: 70 mL/min — ABNORMAL LOW (ref >90)

## 2013-07-02 LAB — CBC
HCT: 38.5 % (ref 36.0–46.0)
Hemoglobin: 12.4 g/dL (ref 12.0–15.0)
MCH: 27.4 pg (ref 26.0–34.0)
MCHC: 32.2 g/dL (ref 30.0–36.0)
MCV: 85 fL (ref 78.0–100.0)
Platelets: 236 10*3/uL (ref 150–400)
RBC: 4.53 MIL/uL (ref 3.87–5.11)
RDW: 13.3 % (ref 11.5–15.5)
WBC: 14.7 10*3/uL — ABNORMAL HIGH (ref 4.0–10.5)

## 2013-07-02 SURGERY — LAPAROSCOPIC REPAIR AND REMOVAL OF GASTRIC BAND
Anesthesia: General

## 2013-07-02 MED ORDER — ONDANSETRON HCL 4 MG/2ML IJ SOLN: 4.0000 mg | INTRAMUSCULAR | Status: DC | PRN

## 2013-07-02 MED ORDER — BUPIVACAINE LIPOSOME 1.3 % IJ SUSP
20.0000 mL | Freq: Once | INTRAMUSCULAR | Status: AC
  Administered 2013-07-02: 20 mL
  Filled 2013-07-02: qty 20

## 2013-07-02 MED ORDER — GLYCOPYRROLATE 0.2 MG/ML IJ SOLN
INTRAMUSCULAR | Status: AC
  Filled 2013-07-02: qty 4

## 2013-07-02 MED ORDER — PHENYLEPHRINE 40 MCG/ML (10ML) SYRINGE FOR IV PUSH (FOR BLOOD PRESSURE SUPPORT)
PREFILLED_SYRINGE | INTRAVENOUS | Status: AC
  Filled 2013-07-02: qty 10

## 2013-07-02 MED ORDER — SUFENTANIL CITRATE 50 MCG/ML IV SOLN
INTRAVENOUS | Status: DC | PRN
  Administered 2013-07-02 (×6): 5 ug via INTRAVENOUS

## 2013-07-02 MED ORDER — ACETAMINOPHEN 160 MG/5ML PO SOLN: 650.0000 mg | ORAL | Status: DC | PRN

## 2013-07-02 MED ORDER — DEXTROSE 5 % IV SOLN
INTRAVENOUS | Status: AC
  Filled 2013-07-02: qty 1

## 2013-07-02 MED ORDER — UNJURY CHOCOLATE CLASSIC POWDER: 2.0000 [oz_av] | Freq: Four times a day (QID) | ORAL | Status: DC

## 2013-07-02 MED ORDER — ACETAMINOPHEN 10 MG/ML IV SOLN
1000.0000 mg | Freq: Four times a day (QID) | INTRAVENOUS | Status: AC
  Administered 2013-07-02 – 2013-07-03 (×4): 1000 mg via INTRAVENOUS
  Filled 2013-07-02 (×4): qty 100

## 2013-07-02 MED ORDER — PROPOFOL 10 MG/ML IV BOLUS
INTRAVENOUS | Status: DC | PRN
  Administered 2013-07-02: 250 mg via INTRAVENOUS

## 2013-07-02 MED ORDER — HEPARIN SODIUM (PORCINE) 5000 UNIT/ML IJ SOLN
5000.0000 [IU] | INTRAMUSCULAR | Status: AC
  Administered 2013-07-02: 5000 [IU] via SUBCUTANEOUS
  Filled 2013-07-02: qty 1

## 2013-07-02 MED ORDER — FENTANYL CITRATE 0.05 MG/ML IJ SOLN
INTRAMUSCULAR | Status: AC
  Filled 2013-07-02: qty 5

## 2013-07-02 MED ORDER — SUFENTANIL CITRATE 50 MCG/ML IV SOLN
INTRAVENOUS | Status: AC
  Filled 2013-07-02: qty 1

## 2013-07-02 MED ORDER — NEOSTIGMINE METHYLSULFATE 1 MG/ML IJ SOLN
INTRAMUSCULAR | Status: AC
  Filled 2013-07-02: qty 10

## 2013-07-02 MED ORDER — BIOTENE DRY MOUTH MT LIQD
15.0000 mL | Freq: Two times a day (BID) | OROMUCOSAL | Status: DC
  Administered 2013-07-02 – 2013-07-03 (×3): 15 mL via OROMUCOSAL

## 2013-07-02 MED ORDER — EPHEDRINE SULFATE 50 MG/ML IJ SOLN
INTRAMUSCULAR | Status: DC | PRN
  Administered 2013-07-02: 10 mg via INTRAVENOUS
  Administered 2013-07-02: 5 mg via INTRAVENOUS

## 2013-07-02 MED ORDER — SODIUM CHLORIDE 0.9 % IJ SOLN
INTRAMUSCULAR | Status: AC
  Filled 2013-07-02: qty 10

## 2013-07-02 MED ORDER — SUCCINYLCHOLINE CHLORIDE 20 MG/ML IJ SOLN
INTRAMUSCULAR | Status: AC
  Filled 2013-07-02: qty 1

## 2013-07-02 MED ORDER — GLYCOPYRROLATE 0.2 MG/ML IJ SOLN
INTRAMUSCULAR | Status: DC | PRN
  Administered 2013-07-02: .8 mg via INTRAVENOUS

## 2013-07-02 MED ORDER — LACTATED RINGERS IV SOLN
INTRAVENOUS | Status: DC | PRN
  Administered 2013-07-02: 07:00:00 via INTRAVENOUS

## 2013-07-02 MED ORDER — NEOSTIGMINE METHYLSULFATE 1 MG/ML IJ SOLN
INTRAMUSCULAR | Status: DC | PRN
  Administered 2013-07-02: 5 mg via INTRAVENOUS

## 2013-07-02 MED ORDER — ONDANSETRON HCL 4 MG/2ML IJ SOLN
INTRAMUSCULAR | Status: AC
  Filled 2013-07-02: qty 2

## 2013-07-02 MED ORDER — MIDAZOLAM HCL 2 MG/2ML IJ SOLN
INTRAMUSCULAR | Status: AC
  Filled 2013-07-02: qty 2

## 2013-07-02 MED ORDER — PHENYLEPHRINE HCL 10 MG/ML IJ SOLN
INTRAMUSCULAR | Status: DC | PRN
  Administered 2013-07-02: 80 ug via INTRAVENOUS

## 2013-07-02 MED ORDER — 0.9 % SODIUM CHLORIDE (POUR BTL) OPTIME
TOPICAL | Status: DC | PRN
  Administered 2013-07-02: 1000 mL

## 2013-07-02 MED ORDER — INSULIN GLARGINE 100 UNIT/ML ~~LOC~~ SOLN
10.0000 [IU] | Freq: Every day | SUBCUTANEOUS | Status: DC
  Administered 2013-07-02 – 2013-07-03 (×2): 10 [IU] via SUBCUTANEOUS
  Filled 2013-07-02 (×3): qty 0.1

## 2013-07-02 MED ORDER — UNJURY VANILLA POWDER: 2.0000 [oz_av] | Freq: Four times a day (QID) | ORAL | Status: DC

## 2013-07-02 MED ORDER — TISSEEL VH 10 ML EX KIT
PACK | CUTANEOUS | Status: AC
  Filled 2013-07-02: qty 2

## 2013-07-02 MED ORDER — EPHEDRINE SULFATE 50 MG/ML IJ SOLN
INTRAMUSCULAR | Status: AC
  Filled 2013-07-02: qty 1

## 2013-07-02 MED ORDER — HYDROMORPHONE HCL PF 1 MG/ML IJ SOLN
0.5000 mg | INTRAMUSCULAR | Status: DC | PRN
  Administered 2013-07-02 – 2013-07-03 (×4): 1 mg via INTRAVENOUS
  Filled 2013-07-02 (×4): qty 1

## 2013-07-02 MED ORDER — CHLORHEXIDINE GLUCONATE 0.12 % MT SOLN
15.0000 mL | Freq: Two times a day (BID) | OROMUCOSAL | Status: DC
  Administered 2013-07-02 – 2013-07-04 (×4): 15 mL via OROMUCOSAL
  Filled 2013-07-02 (×6): qty 15

## 2013-07-02 MED ORDER — LACTATED RINGERS IR SOLN
Status: DC | PRN
  Administered 2013-07-02: 1

## 2013-07-02 MED ORDER — PROPOFOL 10 MG/ML IV BOLUS
INTRAVENOUS | Status: AC
  Filled 2013-07-02: qty 20

## 2013-07-02 MED ORDER — HEPARIN SODIUM (PORCINE) 5000 UNIT/ML IJ SOLN
5000.0000 [IU] | Freq: Three times a day (TID) | INTRAMUSCULAR | Status: DC
  Administered 2013-07-02 – 2013-07-04 (×5): 5000 [IU] via SUBCUTANEOUS
  Filled 2013-07-02 (×8): qty 1

## 2013-07-02 MED ORDER — STERILE WATER FOR IRRIGATION IR SOLN
Status: DC | PRN
  Administered 2013-07-02: 1500 mL

## 2013-07-02 MED ORDER — OXYCODONE-ACETAMINOPHEN 5-325 MG/5ML PO SOLN
5.0000 mL | ORAL | Status: DC | PRN
  Administered 2013-07-04: 10 mL via ORAL
  Filled 2013-07-02: qty 10

## 2013-07-02 MED ORDER — HYDROMORPHONE HCL PF 1 MG/ML IJ SOLN
INTRAMUSCULAR | Status: AC
  Filled 2013-07-02: qty 1

## 2013-07-02 MED ORDER — ATROPINE SULFATE 0.4 MG/ML IJ SOLN
INTRAMUSCULAR | Status: AC
  Filled 2013-07-02: qty 2

## 2013-07-02 MED ORDER — MORPHINE SULFATE 2 MG/ML IJ SOLN: 2.0000 mg | INTRAMUSCULAR | Status: DC | PRN

## 2013-07-02 MED ORDER — HYDROMORPHONE HCL PF 1 MG/ML IJ SOLN
0.2500 mg | INTRAMUSCULAR | Status: DC | PRN
  Administered 2013-07-02 (×2): 0.5 mg via INTRAVENOUS

## 2013-07-02 MED ORDER — ROCURONIUM BROMIDE 100 MG/10ML IV SOLN
INTRAVENOUS | Status: AC
  Filled 2013-07-02: qty 1

## 2013-07-02 MED ORDER — MIDAZOLAM HCL 5 MG/5ML IJ SOLN
INTRAMUSCULAR | Status: DC | PRN
  Administered 2013-07-02: 2 mg via INTRAVENOUS

## 2013-07-02 MED ORDER — DEXAMETHASONE SODIUM PHOSPHATE 10 MG/ML IJ SOLN
INTRAMUSCULAR | Status: AC
  Filled 2013-07-02: qty 1

## 2013-07-02 MED ORDER — LIDOCAINE HCL (CARDIAC) 20 MG/ML IV SOLN
INTRAVENOUS | Status: DC | PRN
  Administered 2013-07-02: 100 mg via INTRAVENOUS

## 2013-07-02 MED ORDER — DEXAMETHASONE SODIUM PHOSPHATE 10 MG/ML IJ SOLN
INTRAMUSCULAR | Status: DC | PRN
  Administered 2013-07-02: 10 mg via INTRAVENOUS

## 2013-07-02 MED ORDER — DEXTROSE 5 % IV SOLN
2.0000 g | INTRAVENOUS | Status: AC
  Administered 2013-07-02 (×2): 2 g via INTRAVENOUS

## 2013-07-02 MED ORDER — SCOPOLAMINE 1 MG/3DAYS TD PT72
1.0000 | MEDICATED_PATCH | TRANSDERMAL | Status: DC
  Administered 2013-07-02: 1.5 mg via TRANSDERMAL
  Filled 2013-07-02: qty 1

## 2013-07-02 MED ORDER — LIDOCAINE HCL (CARDIAC) 20 MG/ML IV SOLN
INTRAVENOUS | Status: AC
  Filled 2013-07-02: qty 5

## 2013-07-02 MED ORDER — ROCURONIUM BROMIDE 100 MG/10ML IV SOLN
INTRAVENOUS | Status: DC | PRN
  Administered 2013-07-02: 10 mg via INTRAVENOUS
  Administered 2013-07-02: 40 mg via INTRAVENOUS
  Administered 2013-07-02: 10 mg via INTRAVENOUS
  Administered 2013-07-02: 20 mg via INTRAVENOUS
  Administered 2013-07-02: 10 mg via INTRAVENOUS

## 2013-07-02 MED ORDER — ATROPINE SULFATE 0.4 MG/ML IJ SOLN
INTRAMUSCULAR | Status: DC | PRN
  Administered 2013-07-02: .6 mg via INTRAVENOUS

## 2013-07-02 MED ORDER — KCL IN DEXTROSE-NACL 20-5-0.45 MEQ/L-%-% IV SOLN
INTRAVENOUS | Status: DC
  Administered 2013-07-02 – 2013-07-03 (×3): via INTRAVENOUS
  Filled 2013-07-02 (×6): qty 1000

## 2013-07-02 MED ORDER — ACETAMINOPHEN 10 MG/ML IV SOLN
1000.0000 mg | Freq: Once | INTRAVENOUS | Status: AC
  Administered 2013-07-02: 1000 mg via INTRAVENOUS
  Filled 2013-07-02: qty 100

## 2013-07-02 MED ORDER — PHENYLEPHRINE HCL 10 MG/ML IJ SOLN
20.0000 mg | INTRAVENOUS | Status: DC | PRN
  Administered 2013-07-02: 10 ug/min via INTRAVENOUS

## 2013-07-02 MED ORDER — LACTATED RINGERS IV SOLN: INTRAVENOUS | Status: DC

## 2013-07-02 MED ORDER — PROMETHAZINE HCL 25 MG/ML IJ SOLN: 6.2500 mg | INTRAMUSCULAR | Status: DC | PRN

## 2013-07-02 MED ORDER — TISSEEL VH 10 ML EX KIT
PACK | CUTANEOUS | Status: DC | PRN
  Administered 2013-07-02: 10 mL

## 2013-07-02 MED ORDER — UNJURY CHICKEN SOUP POWDER
2.0000 [oz_av] | Freq: Four times a day (QID) | ORAL | Status: DC
  Administered 2013-07-04: 2 [oz_av] via ORAL

## 2013-07-02 MED ORDER — INSULIN ASPART 100 UNIT/ML ~~LOC~~ SOLN
0.0000 [IU] | SUBCUTANEOUS | Status: DC
  Administered 2013-07-02 (×3): 3 [IU] via SUBCUTANEOUS
  Administered 2013-07-03 (×3): 2 [IU] via SUBCUTANEOUS

## 2013-07-02 MED ORDER — DEXTROSE 5 % IV SOLN
INTRAVENOUS | Status: AC
  Filled 2013-07-02 (×2): qty 1

## 2013-07-02 MED ORDER — ONDANSETRON HCL 4 MG/2ML IJ SOLN
INTRAMUSCULAR | Status: DC | PRN
  Administered 2013-07-02: 4 mg via INTRAVENOUS

## 2013-07-02 MED ORDER — SUCCINYLCHOLINE CHLORIDE 20 MG/ML IJ SOLN
INTRAMUSCULAR | Status: DC | PRN
  Administered 2013-07-02: 140 mg via INTRAVENOUS

## 2013-07-02 MED ORDER — PHENYLEPHRINE HCL 10 MG/ML IJ SOLN
INTRAMUSCULAR | Status: AC
  Filled 2013-07-02: qty 2

## 2013-07-02 MED ORDER — KCL IN DEXTROSE-NACL 20-5-0.45 MEQ/L-%-% IV SOLN
INTRAVENOUS | Status: AC
  Administered 2013-07-02: 1000 mL
  Filled 2013-07-02: qty 1000

## 2013-07-02 SURGICAL SUPPLY — 86 items
APL SRG 32X5 SNPLK LF DISP (MISCELLANEOUS) ×1
APPLICATOR COTTON TIP 6IN STRL (MISCELLANEOUS) IMPLANT
APPLIER CLIP ROT 10 11.4 M/L (STAPLE)
BENZOIN TINCTURE PRP APPL 2/3 (GAUZE/BANDAGES/DRESSINGS) IMPLANT
BLADE HEX COATED 2.75 (ELECTRODE) ×2 IMPLANT
BLADE SURG 15 STRL LF DISP TIS (BLADE) ×1 IMPLANT
BLADE SURG 15 STRL SS (BLADE) ×1
CABLE HIGH FREQUENCY MONO STRZ (ELECTRODE) ×2 IMPLANT
CANISTER SUCTION 2500CC (MISCELLANEOUS) ×2 IMPLANT
CLIP APPLIE ROT 10 11.4 M/L (STAPLE) IMPLANT
COVER SURGICAL LIGHT HANDLE (MISCELLANEOUS) IMPLANT
DECANTER SPIKE VIAL GLASS SM (MISCELLANEOUS) IMPLANT
DERMABOND ADVANCED (GAUZE/BANDAGES/DRESSINGS) ×1
DERMABOND ADVANCED .7 DNX12 (GAUZE/BANDAGES/DRESSINGS) ×1 IMPLANT
DEVICE SUT QUICK LOAD TK 5 (STAPLE) IMPLANT
DEVICE SUT TI-KNOT TK 5X26 (MISCELLANEOUS) IMPLANT
DEVICE SUTURE ENDOST 10MM (ENDOMECHANICALS) IMPLANT
DEVICE TROCAR PUNCTURE CLOSURE (ENDOMECHANICALS) ×2 IMPLANT
DISSECTOR BLUNT TIP ENDO 5MM (MISCELLANEOUS) ×2 IMPLANT
DRAIN CHANNEL 19F RND (DRAIN) ×2 IMPLANT
DRAPE CAMERA CLOSED 9X96 (DRAPES) ×2 IMPLANT
ELECT REM PT RETURN 9FT ADLT (ELECTROSURGICAL) ×2
ELECTRODE REM PT RTRN 9FT ADLT (ELECTROSURGICAL) ×1 IMPLANT
EVACUATOR SILICONE 100CC (DRAIN) ×2 IMPLANT
GLOVE BIOGEL M 8.0 STRL (GLOVE) ×2 IMPLANT
GLOVE BIOGEL PI IND STRL 7.0 (GLOVE) ×1 IMPLANT
GLOVE BIOGEL PI INDICATOR 7.0 (GLOVE) ×1
GLOVE SURG SIGNA 7.5 PF LTX (GLOVE) ×2 IMPLANT
GOWN PREVENTION PLUS LG XLONG (DISPOSABLE) IMPLANT
GOWN STRL REIN XL XLG (GOWN DISPOSABLE) ×12 IMPLANT
HOVERMATT SINGLE USE (MISCELLANEOUS) ×2 IMPLANT
KIT BASIN OR (CUSTOM PROCEDURE TRAY) ×2 IMPLANT
NEEDLE SPNL 22GX3.5 QUINCKE BK (NEEDLE) ×2 IMPLANT
NS IRRIG 1000ML POUR BTL (IV SOLUTION) ×2 IMPLANT
PACK UNIVERSAL I (CUSTOM PROCEDURE TRAY) ×2 IMPLANT
PENCIL BUTTON HOLSTER BLD 10FT (ELECTRODE) ×2 IMPLANT
RELOAD BLUE (STAPLE) ×2 IMPLANT
RELOAD ENDO STITCH (ENDOMECHANICALS) IMPLANT
RELOAD GOLD (STAPLE) ×8 IMPLANT
RELOAD GREEN (STAPLE) ×4 IMPLANT
SCALPEL HARMONIC ACE (MISCELLANEOUS) ×2 IMPLANT
SCISSORS LAP 5X45 EPIX DISP (ENDOMECHANICALS) ×2 IMPLANT
SCRUB PCMX 4 OZ (MISCELLANEOUS) ×2 IMPLANT
SEALANT SURGICAL APPL DUAL CAN (MISCELLANEOUS) ×2 IMPLANT
SET IRRIG TUBING LAPAROSCOPIC (IRRIGATION / IRRIGATOR) ×2 IMPLANT
SHEARS CURVED HARMONIC AC 45CM (MISCELLANEOUS) ×2 IMPLANT
SLEEVE ADV FIXATION 12X100MM (TROCAR) IMPLANT
SLEEVE ADV FIXATION 5X100MM (TROCAR) ×2 IMPLANT
SOLUTION ANTI FOG 6CC (MISCELLANEOUS) ×2 IMPLANT
SPONGE DRAIN TRACH 4X4 STRL 2S (GAUZE/BANDAGES/DRESSINGS) ×2 IMPLANT
SPONGE GAUZE 4X4 12PLY (GAUZE/BANDAGES/DRESSINGS) ×2 IMPLANT
SPONGE LAP 18X18 X RAY DECT (DISPOSABLE) ×2 IMPLANT
STAPLE ECHEON FLEX 60 POW ENDO (STAPLE) ×2 IMPLANT
STAPLER VISISTAT 35W (STAPLE) ×2 IMPLANT
STRIP CLOSURE SKIN 1/2X4 (GAUZE/BANDAGES/DRESSINGS) IMPLANT
SUT ETHIBOND 2 0 SH (SUTURE)
SUT ETHIBOND 2 0 SH 36X2 (SUTURE) IMPLANT
SUT ETHILON 2 0 PS N (SUTURE) IMPLANT
SUT PROLENE 2 0 CT2 30 (SUTURE) IMPLANT
SUT SILK 0 (SUTURE)
SUT SILK 0 30XBRD TIE 6 (SUTURE) IMPLANT
SUT SURGIDAC NAB ES-9 0 48 120 (SUTURE) IMPLANT
SUT VIC AB 2-0 SH 27 (SUTURE) ×2
SUT VIC AB 2-0 SH 27X BRD (SUTURE) ×1 IMPLANT
SUT VIC AB 4-0 SH 18 (SUTURE) ×2 IMPLANT
SUT VICRYL 0 UR6 27IN ABS (SUTURE) ×2 IMPLANT
SYR 20CC LL (SYRINGE) ×2 IMPLANT
SYR 30ML LL (SYRINGE) IMPLANT
SYR 50ML LL SCALE MARK (SYRINGE) ×2 IMPLANT
TAPE CLOTH SURG 4X10 WHT LF (GAUZE/BANDAGES/DRESSINGS) ×2 IMPLANT
TOWEL OR 17X26 10 PK STRL BLUE (TOWEL DISPOSABLE) ×2 IMPLANT
TOWEL OR NON WOVEN STRL DISP B (DISPOSABLE) ×2 IMPLANT
TRAY FOLEY CATH 14FRSI W/METER (CATHETERS) ×2 IMPLANT
TROCAR ADV FIXATION 11X100MM (TROCAR) IMPLANT
TROCAR ADV FIXATION 12X100MM (TROCAR) ×2 IMPLANT
TROCAR ADV FIXATION 5X100MM (TROCAR) ×4 IMPLANT
TROCAR BLADELESS 15MM (ENDOMECHANICALS) ×2 IMPLANT
TROCAR BLADELESS OPT 5 100 (ENDOMECHANICALS) ×2 IMPLANT
TROCAR XCEL NON-BLD 11X100MML (ENDOMECHANICALS) ×2 IMPLANT
TROCAR XCEL NON-BLD 5MMX100MML (ENDOMECHANICALS) ×2 IMPLANT
TROCAR XCEL UNIV SLVE 11M 100M (ENDOMECHANICALS) IMPLANT
TUBE CALIBRATION LAPBAND (TUBING) ×2 IMPLANT
TUBING CONNECTING 10 (TUBING) ×2 IMPLANT
TUBING ENDO SMARTCAP (MISCELLANEOUS) ×2 IMPLANT
TUBING FILTER THERMOFLATOR (ELECTROSURGICAL) ×2 IMPLANT
TUBING INSUFFLATION 10FT LAP (TUBING) ×2 IMPLANT

## 2013-07-02 NOTE — Preoperative (Signed)
Beta Blockers   Reason not to administer Beta Blockers:Not Applicable 

## 2013-07-02 NOTE — Interval H&P Note (Signed)
History and Physical Interval Note:  07/02/2013 7:23 AM  [name]  has presented today for surgery, with the diagnosis of non function lap band removal lap band conversion to sleeve   The various methods of treatment have been discussed with the patient and family. After consideration of risks, benefits and other options for treatment, the patient has consented to  Procedure(s): LAPAROSCOPIC REMOVAL OF GASTRIC BAND (N/A) LAPAROSCOPIC GASTRIC SLEEVE RESECTION (N/A) as a surgical intervention .  The patient's history has been reviewed, patient examined, no change in status, stable for surgery.  I have reviewed the patient's chart and labs.  Questions were answered to the patient's satisfaction.     Shereen Marton B

## 2013-07-02 NOTE — Brief Op Note (Signed)
07/02/2013  11:08 AM  PATIENT:  [name]  57 y.o. female  PRE-OPERATIVE DIAGNOSIS:  non function lap band removal lap band conversion to sleeve   POST-OPERATIVE DIAGNOSIS:  non function lap band  PROCEDURE:  Procedure(s): LAPAROSCOPIC REMOVAL OF GASTRIC BAND (N/A) LAPAROSCOPIC GASTRIC SLEEVE RESECTION upper endoscopy (N/A)  SURGEON:  Surgeon(s) and Role:    * Valarie Merino, MD - Primary    * Kandis Cocking, MD - Assisting  PHYSICIAN ASSISTANT:   ASSISTANTS: Ovidio Kin, MD, FACS   ANESTHESIA:   general  EBL:  Total I/O In: 2000 [I.V.:2000] Out: 95 [Urine:30; Blood:65]  BLOOD ADMINISTERED:none  DRAINS: (19) Jackson-Pratt drain(s) with closed bulb suction in the subcostal region   LOCAL MEDICATIONS USED:  OTHER [employer]  SPECIMEN:  Source of Specimen:  stomach  DISPOSITION OF SPECIMEN:  PATHOLOGY  COUNTS:  YES  TOURNIQUET:  * No tourniquets in log *  DICTATION: .Other Dictation: Dictation Number [id number]  PLAN OF CARE: Admit to inpatient   PATIENT DISPOSITION:  PACU - hemodynamically stable.   Delay start of Pharmacological VTE agent (>24hrs) due to surgical blood loss or risk of bleeding: no

## 2013-07-02 NOTE — Progress Notes (Signed)
NOS  Doing well.  She has been up walking.  BP 125/70  Pulse 60  Temp(Src) 97.6 F (36.4 C) (Oral)  Resp 20  Ht 5\' 11"  (1.803 m)  Wt 228 lb 6 oz (103.59 kg)  BMI 31.87 kg/m2  SpO2 96%  Abdomen looks good.  S/P lap band removal and conversion to sleeve gastrectomy.  Ovidio Kin, MD, [facility] Pager: [phone] Office phone:  [phone]

## 2013-07-02 NOTE — H&P (Signed)
Chief Complaint: Failure to maintain adequate weight loss with lapband  History of Present Illness: [name] is an 57 y.o. female that has an APL lapband placed with a posterior HH repair by my in 2011. She initially experienced good weight loss until she had an unusual URI that left her with a tender port and GER. Port revision and inspection was done in March but it failed to see any obvious problems with the lapband. Revision to an APS lapband did not produce any more success. She wants to have sleeve gastrectomy. She is aware that we may have to do this in two parts. Will proceed to OR on Dec 22.  Past Medical History   Diagnosis  Date   .  Allergic rhinitis    .  Menopause    .  GERD (gastroesophageal reflux disease)    .  IBS (irritable bowel syndrome)    .  Anxiety    .  Depression    .  Hypertension    .  Hyperlipemia    .  Asthma      hx bronchial asthma at times with upper resp infection   .  Headache      migraine and cluster headaches   .  Stress incontinence      at times   .  Diabetes mellitus type II      type 2    Past Surgical History   Procedure  Date   .  Cesarean section  1989 & 1993     X 2   .  Cervical laminectomy  2005 & 2009     X 2 NO ROM PROBLEMS   .  Foot surgery  2005     RT HEEL   .  Right knee  1981     ARTHROSCOPY AND ARTHROTOMY   .  Nasal sinus surgery  1995 & 1997     X 2   .  Laparoscopic gastric banding  12/30/09   .  Esophagogastroduodenoscopy  06/29/2011     Procedure: ESOPHAGOGASTRODUODENOSCOPY (EGD); Surgeon: Kandis Cocking, MD; Location: [facility]; Service: General; Laterality: N/A;   .  Hysteroscopy  1999   .  Laparoscopy  09/24/2011     Procedure: LAPAROSCOPY DIAGNOSTIC; Surgeon: Valarie Merino, MD; Location: [facility]; Service: General; Laterality: N/A;   .  Gastric banding port revision  09/24/2011     Procedure: GASTRIC BANDING PORT REVISION; Surgeon: Valarie Merino, MD; Location: [facility]; Service: General; Laterality: N/A;    .  Tonsillectomy  1971   .  Abdominal hysterectomy  12/1999   .  Cholecystectomy  1986   .  Tubal ligation  1993     WITH C -SECTION   .  Right knee arthroscopy and arthrotomy  12-1979   .  Cervical conization w/bx  june 1990     dysplasia of cervix/used 5Fu cream for 3 months    Current Facility-Administered Medications   Medication  Dose  Route  Frequency  Provider  Last Rate  Last Dose   .  ceFAZolin (ANCEF) IVPB 2 g/50 mL premix  2 g  Intravenous  30 min Pre-Op  Loletta Specter, [facility]     .  lactated ringers infusion   Intravenous  Continuous  Gaylan Gerold, MD  100 mL/hr at 07/03/12 0910  1,000 mL at 07/03/12 0910   Isoniazid; Nitrofurantoin; Hctz; Promethazine hcl; Macrolides and ketolides; Morphine; Percocet; Sulfamethoxazole w-trimethoprim; and Fentanyl  Family History  Problem  Relation  Age of Onset   .  Diabetes  Mother    .  Stroke  Mother    .  Breast cancer  Mother    .  Atrial fibrillation  Mother    .  Hypertension  Mother    .  Cancer  Mother       breast    .  Diabetes  Father    .  Stroke  Father    .  Hypertension  Father    .  Heart attack  Father    .  Thyroid disease     .  Heart disease     Social History: reports that she has never smoked. She has never used smokeless tobacco. She reports that she does not drink alcohol or use illicit drugs.  REVIEW OF SYSTEMS - PERTINENT POSITIVES ONLY:  No DVT  Physical Exam:  Blood pressure 124/67, pulse 76, temperature 99.5 F (37.5 C), temperature source Oral, resp. rate 18, height 5\' 11"  (1.803 m), weight 225 lb 4 oz (102.173 kg), SpO2 99.00%.  Body mass index is 31.42 kg/(m^2).  Gen: WDWN WF NAD  Neurological: Alert and oriented to person, place, and time. Motor and sensory function is grossly intact  Head: Normocephalic and atraumatic.  Eyes: Conjunctivae are normal. Pupils are equal, round, and reactive to light. No scleral icterus.  Neck: Normal range of motion. Neck supple. No tracheal deviation  or thyromegaly present.  Cardiovascular: SR without murmurs or gallops. No carotid bruits  Respiratory: Effort normal. No respiratory distress. No chest wall tenderness. Breath sounds normal. No wheezes, rales or rhonchi.  Abdomen: nontender  GU:  Musculoskeletal: Normal range of motion. Extremities are nontender. No cyanosis, edema or clubbing noted Lymphadenopathy: No cervical, preauricular, postauricular or axillary adenopathy is present Skin: Skin is warm and dry. No rash noted. No diaphoresis. No erythema. No pallor. Pscyh: Normal mood and affect. Behavior is normal. Judgment and thought content normal.  LABORATORY RESULTS:  Results for orders placed during the hospital encounter of 07/03/12 (from the past 48 hour(s))   GLUCOSE, CAPILLARY Status: Abnormal    Collection Time    07/03/12 8:35 AM   Component  Value  Range  Comment    Glucose-Capillary  115 (*)  70 - 99 mg/dL    RADIOLOGY RESULTS:  No results found.  Problem List:  Patient Active Problem List   Diagnosis   .  ONYCHOMYCOSIS, BILATERAL   .  DIABETES MELLITUS, TYPE II   .  VITAMIN B12 DEFICIENCY   .  UNSPECIFIED VITAMIN D DEFICIENCY   .  HYPERLIPIDEMIA NEC/NOS   .  MORBID OBESITY   .  OTHER SPECIFIED ANEMIAS   .  ANXIETY   .  DEPRESSION   .  HYPERTENSION, ESSENTIAL NOS   .  ACUTE SINUSITIS, UNSPECIFIED   .  ACUTE PHARYNGITIS   .  BRONCHITIS, ACUTE   .  ALLERGIC RHINITIS   .  CELLULITIS   .  Pain in Joint, Multiple Sites   .  BACK PAIN, LUMBAR   .  Unspecified Myalgia and Myositis   .  SKIN RASH   .  HEADACHE   .  COUGH   .  NAUSEA   .  DIARRHEA   .  STRESS INCONTINENCE   .  BARIATRIC SURGERY STATUS   .  ABDOMINAL PAIN, ACUTE   .  Lapband APL + HH repair June 2011   .  GERD (gastroesophageal reflux disease)  Assessment & Plan:  Lapband removal and sleeve gastrectomy  [name], MD, [facility]  [phone] beeper  [phone]

## 2013-07-02 NOTE — Transfer of Care (Signed)
Immediate Anesthesia Transfer of Care Note  Patient: [name]  Procedure(s) Performed: Procedure(s): LAPAROSCOPIC REMOVAL OF GASTRIC BAND (N/A) LAPAROSCOPIC GASTRIC SLEEVE RESECTION upper endoscopy (N/A)  Patient Location: PACU  Anesthesia Type:General  Level of Consciousness: awake, alert , oriented and patient cooperative  Airway & Oxygen Therapy: Patient Spontanous Breathing and Patient connected to face mask oxygen  Post-op Assessment: Report given to PACU RN, Post -op Vital signs reviewed and stable and Patient moving all extremities  Post vital signs: Reviewed and stable  Complications: No apparent anesthesia complications

## 2013-07-02 NOTE — Op Note (Signed)
Name:  PATRIA WARZECHA MRN: [id number] Date of Surgery: 07/02/2013  Preop Diagnosis:  Morbid Obesity  Postop Diagnosis:  Morbid Obesity (BMI - 32.3, Wt - 231), S/P Removal of Lap  Band and Gastric Sleeve  Procedure:  Upper endoscopy  (Intraoperative)  Surgeon:  Ovidio Kin, M.D.  Anesthesia:  GET  Indications for procedure: ELLEAN FIRMAN is a 57 y.o. female whose primary care physician is [name], DO and has completed a Gastric Sleeve today by Dr. [name].  I am doing an intraoperative upper endoscopy to evaluate the gastric pouch.  Operative Note: The patient is under general anesthesia.  Dr. [name] is laparoscoping the patient while I do an upper endoscopy to evaluate the stomach pouch.  With the patient intubated, I passed the Pentax upper endoscope without difficulty down the esophagus.  The esophago-gastric junction was at 38 cm.    The mucosa of the stomach looked viable and the staple line was intact without bleeding.  I advanced to the pylorus, but did not go through it.  While I insufflated the stomach pouch with air, Dr. [name]  flooded the upper abdomen with saline to put the gastric pouch under saline.  There was no bubbling or evidence of a leak.  Photos were taken of the gastric pouch.  There was no evidence of narrowing of the pouch and the gastric sleeve looked tubular.  There was some bulbous changes just distal to the GE junction.  The scope was then withdrawn.  The esophagus was unremarkable and the patient tolerated the endoscopy without difficulty.  Ovidio Kin, MD, [facility] Pager: [phone] Office phone:  [phone]

## 2013-07-02 NOTE — Anesthesia Postprocedure Evaluation (Signed)
Anesthesia Post Note  Patient: [name]  Procedure(s) Performed: Procedure(s) (LRB): LAPAROSCOPIC REMOVAL OF GASTRIC BAND (N/A) LAPAROSCOPIC GASTRIC SLEEVE RESECTION upper endoscopy (N/A)  Anesthesia type: General  Patient location: PACU  Post pain: Pain level controlled  Post assessment: Post-op Vital signs reviewed  Last Vitals:  Filed Vitals:   07/02/13 1253  BP: 121/82  Pulse: 64  Temp: 36.6 C  Resp:     Post vital signs: Reviewed  Level of consciousness: sedated  Complications: No apparent anesthesia complications

## 2013-07-02 NOTE — Anesthesia Preprocedure Evaluation (Addendum)
Anesthesia Evaluation  Patient identified by MRN, date of birth, ID band Patient awake    Reviewed: Allergy & Precautions, H&P , NPO status , Patient's Chart, lab work & pertinent test results  Airway Mallampati: II TM Distance: >3 FB Neck ROM: Full    Dental  (+) Teeth Intact and Dental Advisory Given   Pulmonary asthma ,  breath sounds clear to auscultation  Pulmonary exam normal       Cardiovascular hypertension, Pt. on medications Rhythm:Regular Rate:Normal     Neuro/Psych  Headaches, PSYCHIATRIC DISORDERS Anxiety Depression  Neuromuscular disease    GI/Hepatic Neg liver ROS, GERD-  Medicated and Poorly Controlled,  Endo/Other  diabetes  Renal/GU negative Renal ROS  negative genitourinary   Musculoskeletal negative musculoskeletal ROS (+)   Abdominal (+) + obese,   Peds  Hematology  (+) anemia ,   Anesthesia Other Findings   Reproductive/Obstetrics negative OB ROS                         Anesthesia Physical Anesthesia Plan  ASA: III  Anesthesia Plan: General   Post-op Pain Management:    Induction: Intravenous  Airway Management Planned: Oral ETT  Additional Equipment:   Intra-op Plan: Utilization of Controlled Hypotension per surrgeon request  Post-operative Plan: Extubation in OR  Informed Consent:   Dental advisory given  Plan Discussed with: CRNA  Anesthesia Plan Comments:         Anesthesia Quick Evaluation                                  Anesthesia Evaluation  Patient identified by MRN, date of birth, ID band Patient awake    Reviewed: Allergy & Precautions, H&P , NPO status , Patient's Chart, lab work & pertinent test results  Airway Mallampati: II TM Distance: >3 FB Neck ROM: Full    Dental  (+) Teeth Intact and Dental Advisory Given   Pulmonary asthma ,  breath sounds clear to auscultation  Pulmonary exam normal       Cardiovascular hypertension, Pt. on medications Rhythm:Regular Rate:Normal     Neuro/Psych  Headaches, PSYCHIATRIC DISORDERS Anxiety Depression  Neuromuscular disease    GI/Hepatic negative GI ROS, Neg liver ROS, GERD-  Medicated and Poorly Controlled,  Endo/Other  diabetes, Type 2  Renal/GU negative Renal ROS  negative genitourinary   Musculoskeletal negative musculoskeletal ROS (+)   Abdominal (+) + obese,   Peds negative pediatric ROS (+)  Hematology negative hematology ROS (+)   Anesthesia Other Findings   Reproductive/Obstetrics negative OB ROS                           Anesthesia Physical  Anesthesia Plan  ASA: III  Anesthesia Plan: General   Post-op Pain Management:    Induction: Intravenous  Airway Management Planned: Oral ETT  Additional Equipment:   Intra-op Plan:   Post-operative Plan: Extubation in OR  Informed Consent: I have reviewed the patients History and Physical, chart, labs and discussed the procedure including the risks, benefits and alternatives for the proposed anesthesia with the patient or authorized representative who has indicated his/her understanding and acceptance.   Dental advisory given  Plan Discussed with: CRNA  Anesthesia Plan Comments:         Anesthesia Quick Evaluation

## 2013-07-03 ENCOUNTER — Encounter (HOSPITAL_COMMUNITY): Payer: Self-pay | Admitting: Surgery

## 2013-07-03 ENCOUNTER — Inpatient Hospital Stay (HOSPITAL_COMMUNITY): Payer: 59

## 2013-07-03 ENCOUNTER — Telehealth (INDEPENDENT_AMBULATORY_CARE_PROVIDER_SITE_OTHER): Payer: Self-pay

## 2013-07-03 DIAGNOSIS — M79609 Pain in unspecified limb: Secondary | ICD-10-CM

## 2013-07-03 LAB — CBC WITH DIFFERENTIAL/PLATELET
Basophils Absolute: 0 10*3/uL (ref 0.0–0.1)
Basophils Relative: 0 % (ref 0–1)
Eosinophils Absolute: 0 10*3/uL (ref 0.0–0.7)
Eosinophils Relative: 0 % (ref 0–5)
HCT: 34.6 % — ABNORMAL LOW (ref 36.0–46.0)
Hemoglobin: 11.2 g/dL — ABNORMAL LOW (ref 12.0–15.0)
Lymphocytes Relative: 12 % (ref 12–46)
Lymphs Abs: 1.6 10*3/uL (ref 0.7–4.0)
MCH: 27.1 pg (ref 26.0–34.0)
MCHC: 32.4 g/dL (ref 30.0–36.0)
MCV: 83.6 fL (ref 78.0–100.0)
Monocytes Absolute: 0.8 10*3/uL (ref 0.1–1.0)
Monocytes Relative: 6 % (ref 3–12)
Neutro Abs: 11.2 10*3/uL — ABNORMAL HIGH (ref 1.7–7.7)
Neutrophils Relative %: 82 % — ABNORMAL HIGH (ref 43–77)
Platelets: 213 10*3/uL (ref 150–400)
RBC: 4.14 MIL/uL (ref 3.87–5.11)
RDW: 13.3 % (ref 11.5–15.5)
WBC: 13.7 10*3/uL — ABNORMAL HIGH (ref 4.0–10.5)

## 2013-07-03 LAB — COMPREHENSIVE METABOLIC PANEL
ALT: 64 U/L — ABNORMAL HIGH (ref 0–35)
AST: 46 U/L — ABNORMAL HIGH (ref 0–37)
Albumin: 3.2 g/dL — ABNORMAL LOW (ref 3.5–5.2)
Alkaline Phosphatase: 56 U/L (ref 39–117)
BUN: 9 mg/dL (ref 6–23)
CO2: 27 mEq/L (ref 19–32)
Calcium: 9 mg/dL (ref 8.4–10.5)
Chloride: 102 mEq/L (ref 96–112)
Creatinine, Ser: 0.75 mg/dL (ref 0.50–1.10)
GFR calc Af Amer: >90 mL/min (ref >90)
GFR calc non Af Amer: >90 mL/min (ref >90)
Glucose, Bld: 145 mg/dL — ABNORMAL HIGH (ref 70–99)
Potassium: 4.5 mEq/L (ref 3.5–5.1)
Sodium: 136 mEq/L (ref 135–145)
Total Bilirubin: 0.3 mg/dL (ref 0.3–1.2)
Total Protein: 6.6 g/dL (ref 6.0–8.3)

## 2013-07-03 LAB — GLUCOSE, CAPILLARY
Glucose-Capillary: 142 mg/dL — ABNORMAL HIGH (ref 70–99)
Glucose-Capillary: 131 mg/dL — ABNORMAL HIGH (ref 70–99)
Glucose-Capillary: 113 mg/dL — ABNORMAL HIGH (ref 70–99)
Glucose-Capillary: 118 mg/dL — ABNORMAL HIGH (ref 70–99)
Glucose-Capillary: 125 mg/dL — ABNORMAL HIGH (ref 70–99)

## 2013-07-03 LAB — URINALYSIS, ROUTINE W REFLEX MICROSCOPIC
Bilirubin Urine: NEGATIVE
Glucose, UA: NEGATIVE mg/dL
Hgb urine dipstick: NEGATIVE
Ketones, ur: NEGATIVE mg/dL
Leukocytes, UA: NEGATIVE
Nitrite: NEGATIVE
Protein, ur: NEGATIVE mg/dL
Specific Gravity, Urine: 1.014 (ref 1.005–1.030)
Urobilinogen, UA: 0.2 mg/dL (ref 0.0–1.0)
pH: 6 (ref 5.0–8.0)

## 2013-07-03 LAB — HEMOGLOBIN AND HEMATOCRIT, BLOOD
HCT: 35.3 % — ABNORMAL LOW (ref 36.0–46.0)
Hemoglobin: 11.4 g/dL — ABNORMAL LOW (ref 12.0–15.0)

## 2013-07-03 MED ORDER — IOHEXOL 300 MG/ML  SOLN
50.0000 mL | Freq: Once | INTRAMUSCULAR | Status: AC | PRN
  Administered 2013-07-03: 30 mL via ORAL

## 2013-07-03 MED ORDER — PNEUMOCOCCAL VAC POLYVALENT 25 MCG/0.5ML IJ INJ
0.5000 mL | INJECTION | INTRAMUSCULAR | Status: AC
  Administered 2013-07-04: 0.5 mL via INTRAMUSCULAR
  Filled 2013-07-03 (×2): qty 0.5

## 2013-07-03 NOTE — Progress Notes (Signed)
Patient ID: [name], female   DOB: 08-May-1956, 57 y.o.   MRN: [id number] [facility] Progress Note:   1 Day Post-Op  Subjective: Mental status is clear Objective: Vital signs in last 24 hours: Temp:  [97.2 F (36.2 C)-98.1 F (36.7 C)] 97.6 F (36.4 C) (12/23 0617) Pulse Rate:  [56-85] 59 (12/23 0617) Resp:  [13-20] 18 (12/23 0617) BP: (115-167)/(56-82) 120/77 mmHg (12/23 0617) SpO2:  [95 %-100 %] 100 % (12/23 0617)  Intake/Output from previous day: 12/22 0701 - 12/23 0700 In: 3760 [I.V.:3760] Out: 1150 [Urine:905; Drains:180; Blood:65] Intake/Output this shift:    Physical Exam: Work of breathing is normal.  Abdomen minimally sore.  JP serosan  Lab Results:  Results for orders placed during the hospital encounter of 07/02/13 (from the past 48 hour(s))  GLUCOSE, CAPILLARY     Status: Abnormal   Collection Time    07/02/13  5:44 AM      Result Value Range   Glucose-Capillary 100 (*) 70 - 99 mg/dL   Comment 1 Documented in Chart    CBC     Status: Abnormal   Collection Time    07/02/13  1:50 PM      Result Value Range   WBC 14.7 (*) 4.0 - 10.5 K/uL   RBC 4.53  3.87 - 5.11 MIL/uL   Hemoglobin 12.4  12.0 - 15.0 g/dL   HCT 95.6  21.3 - 08.6 %   MCV 85.0  78.0 - 100.0 fL   MCH 27.4  26.0 - 34.0 pg   MCHC 32.2  30.0 - 36.0 g/dL   RDW 57.8  46.9 - 62.9 %   Platelets 236  150 - 400 K/uL  CREATININE, SERUM     Status: Abnormal   Collection Time    07/02/13  1:50 PM      Result Value Range   Creatinine, Ser 0.90  0.50 - 1.10 mg/dL   GFR calc non Af Amer 70 (*) >90 mL/min   GFR calc Af Amer 81 (*) >90 mL/min   Comment: (NOTE)     The eGFR has been calculated using the CKD EPI equation.     This calculation has not been validated in all clinical situations.     eGFR's persistently <90 mL/min signify possible Chronic Kidney     Disease.  GLUCOSE, CAPILLARY     Status: Abnormal   Collection Time    07/02/13  2:15 PM      Result Value Range    Glucose-Capillary 185 (*) 70 - 99 mg/dL  GLUCOSE, CAPILLARY     Status: Abnormal   Collection Time    07/02/13  4:07 PM      Result Value Range   Glucose-Capillary 171 (*) 70 - 99 mg/dL  GLUCOSE, CAPILLARY     Status: Abnormal   Collection Time    07/02/13  7:44 PM      Result Value Range   Glucose-Capillary 166 (*) 70 - 99 mg/dL  GLUCOSE, CAPILLARY     Status: Abnormal   Collection Time    07/02/13 11:39 PM      Result Value Range   Glucose-Capillary 143 (*) 70 - 99 mg/dL  GLUCOSE, CAPILLARY     Status: Abnormal   Collection Time    07/03/13  3:59 AM      Result Value Range   Glucose-Capillary 142 (*) 70 - 99 mg/dL  CBC WITH DIFFERENTIAL     Status: Abnormal   Collection Time  07/03/13  5:57 AM      Result Value Range   WBC 13.7 (*) 4.0 - 10.5 K/uL   RBC 4.14  3.87 - 5.11 MIL/uL   Hemoglobin 11.2 (*) 12.0 - 15.0 g/dL   HCT 16.1 (*) 09.6 - 04.5 %   MCV 83.6  78.0 - 100.0 fL   MCH 27.1  26.0 - 34.0 pg   MCHC 32.4  30.0 - 36.0 g/dL   RDW 40.9  81.1 - 91.4 %   Platelets 213  150 - 400 K/uL   Neutrophils Relative % 82 (*) 43 - 77 %   Neutro Abs 11.2 (*) 1.7 - 7.7 K/uL   Lymphocytes Relative 12  12 - 46 %   Lymphs Abs 1.6  0.7 - 4.0 K/uL   Monocytes Relative 6  3 - 12 %   Monocytes Absolute 0.8  0.1 - 1.0 K/uL   Eosinophils Relative 0  0 - 5 %   Eosinophils Absolute 0.0  0.0 - 0.7 K/uL   Basophils Relative 0  0 - 1 %   Basophils Absolute 0.0  0.0 - 0.1 K/uL  COMPREHENSIVE METABOLIC PANEL     Status: Abnormal   Collection Time    07/03/13  5:57 AM      Result Value Range   Sodium 136  135 - 145 mEq/L   Potassium 4.5  3.5 - 5.1 mEq/L   Chloride 102  96 - 112 mEq/L   CO2 27  19 - 32 mEq/L   Glucose, Bld 145 (*) 70 - 99 mg/dL   BUN 9  6 - 23 mg/dL   Creatinine, Ser 7.82  0.50 - 1.10 mg/dL   Calcium 9.0  8.4 - 95.6 mg/dL   Total Protein 6.6  6.0 - 8.3 g/dL   Albumin 3.2 (*) 3.5 - 5.2 g/dL   AST 46 (*) 0 - 37 U/L   ALT 64 (*) 0 - 35 U/L   Alkaline Phosphatase 56  39  - 117 U/L   Total Bilirubin 0.3  0.3 - 1.2 mg/dL   GFR calc non Af Amer >90  >90 mL/min   GFR calc Af Amer >90  >90 mL/min   Comment: (NOTE)     The eGFR has been calculated using the CKD EPI equation.     This calculation has not been validated in all clinical situations.     eGFR's persistently <90 mL/min signify possible Chronic Kidney     Disease.    Radiology/Results: No results found.  Anti-infectives: Anti-infectives   Start     Dose/Rate Route Frequency Ordered Stop   07/02/13 0525  cefOXitin (MEFOXIN) 2 g in dextrose 5 % 50 mL IVPB     2 g 100 mL/hr over 30 Minutes Intravenous On call to O.R. 07/02/13 0525 07/02/13 0935      Assessment/Plan: Problem List: Patient Active Problem List   Diagnosis Date Noted  . 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 Obesity (BMI 30-39.9) 06/25/2013  . GERD (gastroesophageal reflux disease) 08/20/2011  . Lapband APL + [facility] repair June 2011-Major revision Dec 2013 06/29/2011  . ABDOMINAL PAIN, ACUTE 08/01/2010  . VITAMIN B12 DEFICIENCY 04/01/2010  . BARIATRIC SURGERY STATUS 01/29/2010  . ONYCHOMYCOSIS, BILATERAL 06/30/2009  . STRESS INCONTINENCE 06/10/2009  . ACUTE PHARYNGITIS 04/29/2009  . COUGH 04/27/2009  . BRONCHITIS, ACUTE 04/16/2009  . NAUSEA 04/08/2009  . DIARRHEA 04/08/2009  . MORBID OBESITY 03/03/2009  . SKIN RASH 11/18/2008  .  UNSPECIFIED VITAMIN D DEFICIENCY 07/15/2008  . OTHER SPECIFIED ANEMIAS 07/15/2008  . Pain in Joint, Multiple Sites 06/13/2008  . Unspecified Myalgia and Myositis 06/13/2008  . BACK PAIN, LUMBAR 02/14/2008  . ACUTE SINUSITIS, UNSPECIFIED 09/01/2007  . CELLULITIS 08/12/2007  . ANXIETY 06/09/2007  . DEPRESSION 06/09/2007  . DIABETES MELLITUS, TYPE II 01/04/2007  . HYPERLIPIDEMIA NEC/NOS 01/04/2007  . HYPERTENSION, ESSENTIAL NOS 01/04/2007  . ALLERGIC RHINITIS 01/04/2007  . HEADACHE 01/04/2007    Await UGI. Stable thus far.   1 Day Post-Op    LOS: 1 day    [name], MD, [facility] [phone] beeper [phone]  07/03/2013 7:30 AM

## 2013-07-03 NOTE — Care Management Note (Signed)
    Page 1 of 1   07/03/2013     11:25:14 AM   CARE MANAGEMENT NOTE 07/03/2013  Patient:  [name], [name]   Account Number:  [id number]  Date Initiated:  07/03/2013  Documentation initiated by:  Lorenda Ishihara  Subjective/Objective Assessment:   57 yo female admitted s/p sleeve gastrectomy, lap band removal. PTA lived at home with spouse.     Action/Plan:   Home when stable   Anticipated DC Date:  07/06/2013   Anticipated DC Plan:  HOME/SELF CARE      DC Planning Services  CM consult      Choice offered to / List presented to:             Status of service:  Completed, signed off Medicare Important Message given?   (If response is "NO", the following Medicare IM given date fields will be blank) Date Medicare IM given:   Date Additional Medicare IM given:    Discharge Disposition:  HOME/SELF CARE  Per UR Regulation:  Reviewed for med. necessity/level of care/duration of stay  If discussed at Long Length of Stay Meetings, dates discussed:    Comments:

## 2013-07-03 NOTE — Op Note (Signed)
[name], [name]              ACCOUNT NO.:  [id number]  MEDICAL RECORD NO.:  [id number]  LOCATION:  1539                         FACILITY:  [facility]  PHYSICIAN:  Thornton Park. [name], MD  DATE OF BIRTH:  August 31, 1955  DATE OF PROCEDURE: DATE OF DISCHARGE:                              OPERATIVE REPORT   PREOPERATIVE DIAGNOSIS:  Failure to sustain weight loss with laparoscopic adjustable gastric band.  PROCEDURE:  Explantation of Lap Band APL with laparoscopic sleeve gastrectomy.  SURGEON:  Thornton Park. [name], MD  ASSISTANT:  Sandria Bales. Ezzard Standing, M.D.  ANESTHESIA:  General endotracheal.  DESCRIPTION OF PROCEDURE:  [name] was taken to room #1 and given general anesthesia.  The abdomen was prepped with PCMX and draped sterilely.  A time-out was performed.  Access of the abdomen was achieved with a 5-mm 0-degree Optiview through the left upper quadrant without difficulty.  Following insertion of the trocar and camera, I was then able to place another 5 lateral to the umbilicus for the camera and then, we subsequently had 2 over on the left and then operated head of 15 in the upper just off the midline near the port, a 5 lateral and later I put a 12 little lower to get a better angle for my first staple firing.  The dissection began first due to remove the band.  I inserted a Nathanson retractor through the 5-mm port in the upper midline and with retraction, I then took down the plication and the adhesions to the liver and the plications were divided and the tie knots were removed, and once this was done, the band itself was cut and brought out in pieces through the 15-mm port that I had placed to the right solidly of the midline over by the port itself.  After this had been successfully removed and we surveyed the dissection, we felt like everything looked good and there was not any evidence of an erosion or complication.  I then elected to measure 6 cm from the pylorus and go in to  the lesser sac, and at that point, take down the short gastrics all the way up to the left crus.  We mobilized that area nicely and again looked at it posteriorly and there was no excessive scarring.  With that, I felt that we could safely move toward a sleeve gastrectomy.  The 36 ViSiGi tube was then passed into the antrum and it was placed to suction, so that it was along the lesser curvature.  Previously, we had marked the gross foot and then began with firing two green loads distally with good, nice angles coming in from below and the newly placed 12.  This gave [place] a good acute angle going to the first two firings.  We then used gold loads of the Echelon stapler to then complete the firing one more from the lower port, and then, we moved up to the upper port 15 and completed the stapling of the pouch.  We held the stomach out and I had felt like we had a good anterior-posterior orientation with each firing.  I did have a little bit access noted up near the top and then I  angulated in for my final two firings to complete the pouch creation.  When completed, the detached stomach was pulled aside and air was infused into the ViSiGi and we check for evidence of any bubbles and none were seen.  The tube was then withdrawn and Dr. Ezzard Standing performed endoscopy, which again showed the staple line down into the antrum and the small pouch and again, no evidence of bubbles were noted.  The pouch was decompressed.  The detached stomach was then removed through the 15 port, which was then replaced.  I placed a 19 Blake drain and brought out through the lateral-most port on the left, and after applying some Tisseel along the staple line, I left a drain in place.  The 15 port was repaired using Endo Close and a single 0 Vicryl approximating that. Exparel was injected into all the ports.  I then cut lateral to the 12 and removed her Lap Band port and tubing in toto, and then repaired the 12-mm defect  with a 0 Vicryl.  All of the hardware from the Lab Band had been removed and the sleeve completed and everything was surveyed one last time before deflation. The wounds were then closed with 4-0 Vicryl and Dermabond.  The patient tolerated the procedure well, was taken to the recovery room in satisfactory condition.     Thornton Park [name], MD     MBM/MEDQ  D:  07/02/2013  T:  07/03/2013  Job:  [id number]

## 2013-07-03 NOTE — Progress Notes (Signed)
Came to visit patient on behalf of [employer] [facility] [employer] program for [employer] employees/dependents with [employer]. She is active with the pharmacy [facility] for DM management. Will follow up with patient for post hospital discharge call. Appreciative of visit. Left contact information at bedside.  Raiford Noble, MSN-Ed, RN,BSN- [facility] [phone]

## 2013-07-03 NOTE — Progress Notes (Signed)
Bilateral lower extremity venous duplex:  No evidence of DVT, superficial thrombosis, or Baker's Cyst.   

## 2013-07-03 NOTE — Telephone Encounter (Signed)
DG  UGI results neg for leak

## 2013-07-04 LAB — CBC WITH DIFFERENTIAL/PLATELET
Basophils Absolute: 0 10*3/uL (ref 0.0–0.1)
Basophils Relative: 0 % (ref 0–1)
Eosinophils Absolute: 0.1 10*3/uL (ref 0.0–0.7)
Eosinophils Relative: 1 % (ref 0–5)
HCT: 34.5 % — ABNORMAL LOW (ref 36.0–46.0)
Hemoglobin: 11.3 g/dL — ABNORMAL LOW (ref 12.0–15.0)
Lymphocytes Relative: 26 % (ref 12–46)
Lymphs Abs: 2.8 10*3/uL (ref 0.7–4.0)
MCH: 27.8 pg (ref 26.0–34.0)
MCHC: 32.8 g/dL (ref 30.0–36.0)
MCV: 85 fL (ref 78.0–100.0)
Monocytes Absolute: 0.9 10*3/uL (ref 0.1–1.0)
Monocytes Relative: 9 % (ref 3–12)
Neutro Abs: 7 10*3/uL (ref 1.7–7.7)
Neutrophils Relative %: 65 % (ref 43–77)
Platelets: 207 10*3/uL (ref 150–400)
RBC: 4.06 MIL/uL (ref 3.87–5.11)
RDW: 13.3 % (ref 11.5–15.5)
WBC: 10.9 10*3/uL — ABNORMAL HIGH (ref 4.0–10.5)

## 2013-07-04 LAB — GLUCOSE, CAPILLARY
Glucose-Capillary: 109 mg/dL — ABNORMAL HIGH (ref 70–99)
Glucose-Capillary: 115 mg/dL — ABNORMAL HIGH (ref 70–99)
Glucose-Capillary: 91 mg/dL (ref 70–99)

## 2013-07-04 MED ORDER — HYDROCODONE-ACETAMINOPHEN 7.5-325 MG/15ML PO SOLN: 15.0000 mL | Freq: Four times a day (QID) | ORAL | Status: DC | PRN

## 2013-07-04 MED ORDER — DIPHENHYDRAMINE HCL 50 MG/ML IJ SOLN
25.0000 mg | Freq: Four times a day (QID) | INTRAMUSCULAR | Status: DC | PRN
  Administered 2013-07-04: 25 mg via INTRAVENOUS

## 2013-07-04 MED ORDER — DIPHENHYDRAMINE HCL 50 MG/ML IJ SOLN
INTRAMUSCULAR | Status: AC
  Filled 2013-07-04: qty 1

## 2013-07-04 NOTE — Progress Notes (Signed)
Patient alert and oriented, pain is controlled. Patient is tolerating fluids, plan to advance to protein shake today.  Reviewed Gastric sleeve discharge instructions with patient and patient is able to articulate understanding.  Provided information on BELT program, Support Group and [facility] outpatient pharmacy. All questions answered, will continue to monitor.  

## 2013-07-04 NOTE — Progress Notes (Signed)
Patient complaining of severe itching started after receiving Roxicodone by mouth.  No visible whelps. Dr Maisie Fus made aware, orders received

## 2013-07-04 NOTE — Discharge Summary (Signed)
Physician Discharge Summary  Patient ID: [name] MRN: [id number] DOB/AGE: 03-22-1956 57 y.o.  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  UGI on PD 1 OK.  Rash to Percocet.  JP serous.  Ready for discharge on PD 2.   Consults: none  Significant Diagnostic Studies: UGI    Discharge Exam: Blood pressure 126/77, pulse 91, temperature 97.8 F (36.6 C), temperature source Oral, resp. rate 20, height 5\' 11"  (1.803 m), weight 228 lb 6 oz (103.59 kg), SpO2 93.00%. JP to be removed-serous.  Minimal pain  Disposition: 01-Home or Self Care  Discharge Orders   Future Appointments Provider Department Dept Phone   07/17/2013 3:30 PM [facility] Post-Op Class [facility] [phone]   07/19/2013 9:20 AM Valarie Merino, MD [facility] [place] [phone]   07/20/2013 2:00 PM Myeong Eyvonne Left [facility] [phone]   12/24/2013 8:15 AM Lelon Perla, DO [facility] [phone]   Future Orders Complete By Expires   Discontinue JP/Blake drain  As directed    Increase activity slowly  As directed        Medication List         CALTRATE GUMMY BITES PO  Take 2 tablets by mouth at bedtime.     citalopram 20 MG tablet  Commonly known as:  CELEXA  Take 1 tablet (20 mg total) by mouth every evening.     cyclobenzaprine 10 MG tablet  Commonly known as:  FLEXERIL  Take 10 mg by mouth 3 (three) times daily as needed for muscle spasms.     estradiol 0.1 MG/24HR patch  Commonly known as:  VIVELLE-DOT  Place 1 patch (0.1 mg total)  onto the skin 2 (two) times a week. Monday and Thursday     furosemide 20 MG tablet  Commonly known as:  LASIX  Take 1 tablet (20 mg total) by mouth every morning.     glucose blood test strip  Use as instructed     HYDROcodone-acetaminophen 7.5-325 mg/15 ml solution  Commonly known as:  HYCET  Take 15 mLs by mouth 4 (four) times daily as needed for moderate pain.     HYDROcodone-acetaminophen 5-325 MG per tablet  Commonly known as:  NORCO/VICODIN  Take 1 tablet by mouth every 6 (six) hours as needed for moderate pain.     Liraglutide 18 MG/3ML Sopn  Inject 1.8 mg into the skin daily.     loratadine 10 MG tablet  Commonly known as:  CLARITIN  Take 10 mg by mouth every evening.     olmesartan 20 MG tablet  Commonly known as:  BENICAR  Take 0.5 tablets (10 mg total) by mouth at bedtime.     ONE-A-DAY SCOOBY-DOO GUMMIES PO  Take 1 tablet by mouth daily.     potassium chloride 10 MEQ tablet  Commonly known as:  KLOR-CON M10  Take 1 tablet (10 mEq total) by mouth daily.     rosuvastatin 20 MG tablet  Commonly known as:  CRESTOR  Take 1 tablet (20 mg total) by mouth at bedtime.     vitamin B-12 100  MCG tablet  Commonly known as:  CYANOCOBALAMIN  Take 100 mcg by mouth daily.     zolpidem 5 MG tablet  Commonly known as:  AMBIEN  Take 1 tablet (5 mg total) by mouth at bedtime as needed for sleep.           Follow-up Information   Follow up with Valarie Merino, MD.   Specialty:  General Surgery   Contact information:   [address] [place] [address] [phone]       Signed: Valarie Merino 07/04/2013, 7:48 AM

## 2013-07-09 ENCOUNTER — Telehealth: Payer: Self-pay

## 2013-07-09 DIAGNOSIS — E1159 Type 2 diabetes mellitus with other circulatory complications: Secondary | ICD-10-CM

## 2013-07-09 NOTE — Telephone Encounter (Signed)
We will take it off the list

## 2013-07-09 NOTE — Telephone Encounter (Signed)
Victoza removed from medication list and the patient has been made aware.      KP

## 2013-07-09 NOTE — Telephone Encounter (Signed)
Spoke with patient and she wanted to make Dr. Laury Axon aware that she had her Gastric Sleeve done and her Blood sugars have been in the 70's-80's so she stopped her Victoza.     KP

## 2013-07-17 ENCOUNTER — Encounter: Payer: 59 | Attending: Surgery

## 2013-07-17 DIAGNOSIS — Z9884 Bariatric surgery status: Secondary | ICD-10-CM | POA: Insufficient documentation

## 2013-07-17 DIAGNOSIS — Z09 Encounter for follow-up examination after completed treatment for conditions other than malignant neoplasm: Secondary | ICD-10-CM | POA: Insufficient documentation

## 2013-07-17 DIAGNOSIS — Z713 Dietary counseling and surveillance: Secondary | ICD-10-CM | POA: Insufficient documentation

## 2013-07-17 NOTE — Progress Notes (Signed)
Bariatric Class:  Appt start time: 1530 end time:  1630.  2 Week Post-Operative Nutrition Class  Patient was seen on 07/17/2013 for Post-[facility]ative [facility].   Surgery date: 07/03/12 Surgery type: LAGB Revision Start weight at [facility]: 262.6 lbs  Surgery date: 07/02/2013 Surgery type: Gastric Sleeve  Weight today: 217.5 lbs Weight change: 17.5 lbs  Total weight lost:  45.1 lbs  Weight goal: 180 lbs % goal met: 55%  TANITA  BODY COMP RESULTS  09/15/11 01/31/12 05/08/12 07/18/12 LB revision on 07/03/12 08/30/12 10/13/12 (BELT) 11/06/12 12/20/12 (BELT) 01/04/13 01/24/13 03/23/13 06/21/13 07/17/13   BMI (kg/m^2) 30.3 31.4 32.6 30.8 31.0 32.0 32.1 30.8 31.5 31.9 31.7 32.8 30.3   FM (lbs) 100.5 105.5 111.0 -- 105.0 107.5 110.5 106.0 106.5 109.0 112.0 116.5 104.5   FFM (lbs) 116.0 120.0 123.0 -- 117.5 118.5 119.5 114.5 119.5 120.0 115.0 118.5 113.0   TBW (lbs) 85.0 88.0 90.0 -- 86.0 86.5 87.5 84.0 87.5 88.0 84.0 87.0 82.5    The following the learning objectives were met by the patient during this course:  Identifies Phase 3A (Soft, High Proteins) Dietary Goals and will begin from 2 weeks post-operatively to 2 months post-operatively  Identifies appropriate sources of fluids and proteins   States protein recommendations and appropriate sources post-operatively  Identifies the need for appropriate texture modifications, mastication, and bite sizes when consuming solids  Identifies appropriate multivitamin and calcium sources post-operatively  Describes the need for physical activity post-operatively and will follow MD recommendations  States when to call healthcare provider regarding medication questions or post-operative complications  Handouts given during class include:  Phase 3A: Soft, High Protein Diet Handout  Band Fill Guidelines Handout  Follow-Up Plan: Patient will follow-up at [facility] in 6 weeks for 8 week post-op nutrition  visit for diet advancement per MD.

## 2013-07-17 NOTE — Patient Instructions (Signed)
Patient to follow Phase 3A-Soft, High Protein Diet and follow-up at [facility] in 6 weeks for 2 months post-op nutrition visit for diet advancement. 

## 2013-07-19 ENCOUNTER — Encounter (INDEPENDENT_AMBULATORY_CARE_PROVIDER_SITE_OTHER): Payer: Self-pay

## 2013-07-19 ENCOUNTER — Encounter (INDEPENDENT_AMBULATORY_CARE_PROVIDER_SITE_OTHER): Payer: Self-pay | Admitting: Surgery

## 2013-07-19 ENCOUNTER — Ambulatory Visit (INDEPENDENT_AMBULATORY_CARE_PROVIDER_SITE_OTHER): Payer: Commercial Managed Care - PPO | Admitting: Surgery

## 2013-07-19 VITALS — BP 133/77 | HR 82 | Temp 98.9°F | Resp 18 | Ht 71.0 in | Wt 218.4 lb

## 2013-07-19 DIAGNOSIS — Z9884 Bariatric surgery status: Secondary | ICD-10-CM

## 2013-07-19 NOTE — Patient Instructions (Signed)
Sleeve Gastrectomy A sleeve gastrectomy is a surgery in which a large portion of the stomach is removed. After the surgery, the stomach will be a narrow tube about the size of a banana. This surgery is performed to help a person lose weight. The person loses weight because the reduced size of the stomach restricts the amount of food that the person can eat. The stomach will hold much less food than before the surgery. Also, the part of the stomach that is removed produces a hormone that causes hunger.  This surgery is done for people who have morbid obesity, defined as a body mass index (BMI) greater than 40. BMI is an estimate of body fat and is calculated from the height and weight of a person. This surgery may also be done for people with a BMI between 35 and 40 if they have other diseases, such as type 2 diabetes mellitus, obstructive sleep apnea, or heart and lung disorders (cardiopulmonary diseases).  LET YOUR HEALTH CARE PROVIDER KNOW ABOUT:  Any allergies you have.   All medicines you are taking, including vitamins, herbs, eyedrops, creams, and over-the-counter medicines.   Use of steroids (by mouth or creams).   Previous problems you or members of your family have had with the use of anesthetics.   Any blood disorders you have.   Previous surgeries you have had.   Possibility of pregnancy, if this applies.   Other health problems you have. RISKS AND COMPLICATIONS Generally, sleeve gastrectomy is a safe procedure. However, as with any procedure, complications can occur. Possible complications include:  Infection.  Bleeding.  Blood clots.  Damage to other organs or tissue.  Leakage of fluid from the stomach into the abdominal cavity (rare). BEFORE THE PROCEDURE  You may need to have blood tests and imaging tests (such as X-rays or ultrasonography) done before the day of surgery. A test to evaluate your esophagus and how it moves (esophageal manometry) may also be  done.  You may be placed on a liquid diet 2 3 weeks before the surgery.  Ask your health care provider about changing or stopping your regular medicines.  Do not eat or drink anything for at least 8 hours before the procedure.   Make plans to have someone drive you home after your hospital stay. Also arrange for someone to help you with activities during recovery. PROCEDURE  A laparoscopic technique is usually used for this surgery:  You will be given medicine to make you sleep through the procedure (general anesthetic). This medicine will be given through an intravenous (IV) access tube that is put into one of your veins.  Once you are asleep, your abdomen will be cleaned and sterilized.  Several small incisions will be made in your abdomen.  Your abdomen will be filled with air so that it expands. This gives the surgeon more room to operate and makes your organs easier to see.  A thin, lighted tube with a tiny camera on the end (laparoscope) is put through a small incision in your abdomen. The camera on the laparoscope sends a picture to a TV screen in the operating room. This gives the surgeon a good view inside the abdomen.  Hollow tubes are put through the other small incisions in your abdomen. The tools needed for the procedure are put through these tubes.  The surgeon uses staples to divide part of the stomach and then removes it through one of the incisions.  The remaining stomach may be reinforced using   stitches or surgical glue or both to prevent leakage of the stomach contents. A small tube (drain) may be placed through one of the incisions to allow extra fluid to flow from the area.  The incisions are closed with stitches, staples, or glue. AFTER THE PROCEDURE  You will be monitored closely in a recovery area. Once the anesthetic has worn off, you will likely be moved to a regular hospital room.  You will be given medicine for pain and nausea.   You may have a drain  from one of the incisions in your abdomen. If a drain is used, it may stay in place after you go home from the hospital and be removed at a follow-up appointment.   You will be encouraged to walk around several times a day. This helps prevent blood clots.  You will be started on a liquid diet the first day after your surgery. Sometimes a test is done to check for leaking before you can eat.  You will be urged to cough and do deep breathing exercises. This helps prevent a lung infection after a surgery.  You will likely need to stay in the hospital for a few days.  Document Released: 04/25/2009 Document Revised: 02/28/2013 Document Reviewed: 11/10/2012 [employer] Patient Information 2014 [employer].  

## 2013-07-19 NOTE — Progress Notes (Signed)
[name] 58 y.o.  Body mass index is 30.47 kg/(m^2).  Patient Active Problem List   Diagnosis Date Noted  . 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 Obesity (BMI 30-39.9) 06/25/2013  . GERD (gastroesophageal reflux disease) 08/20/2011  . Lapband [facility] repair June 2011-Major revision Dec 2013 06/29/2011  . ABDOMINAL PAIN, ACUTE 08/01/2010  . VITAMIN B12 DEFICIENCY 04/01/2010  . BARIATRIC SURGERY STATUS 01/29/2010  . ONYCHOMYCOSIS, BILATERAL 06/30/2009  . STRESS INCONTINENCE 06/10/2009  . ACUTE PHARYNGITIS 04/29/2009  . COUGH 04/27/2009  . BRONCHITIS, ACUTE 04/16/2009  . NAUSEA 04/08/2009  . DIARRHEA 04/08/2009  . MORBID OBESITY 03/03/2009  . SKIN RASH 11/18/2008  . UNSPECIFIED VITAMIN D DEFICIENCY 07/15/2008  . OTHER SPECIFIED ANEMIAS 07/15/2008  . Pain in Joint, Multiple Sites 06/13/2008  . Unspecified Myalgia and Myositis 06/13/2008  . BACK PAIN, LUMBAR 02/14/2008  . ACUTE SINUSITIS, UNSPECIFIED 09/01/2007  . CELLULITIS 08/12/2007  . ANXIETY 06/09/2007  . DEPRESSION 06/09/2007  . DIABETES MELLITUS, TYPE II 01/04/2007  . HYPERLIPIDEMIA NEC/NOS 01/04/2007  . HYPERTENSION, ESSENTIAL NOS 01/04/2007  . ALLERGIC RHINITIS 01/04/2007  . HEADACHE 01/04/2007    Allergies  Allergen Reactions  . Isoniazid     Severe SOB  . Nitrofurantoin Shortness Of Breath    REACTION: whelps  . Hctz [Hydrochlorothiazide]     rash  . Promethazine Hcl [Promethazine Hcl]     IF GIVEN  IV-hallucinations     CAN TAKE PO PHENERGAN  . Macrolides And Ketolides     rash  . Morphine     REACTION: severe vomiting  . Percocet [Oxycodone-Acetaminophen]     REACTION: severe itching  . Roxicet [Oxycodone-Acetaminophen]     itching  . Sulfamethoxazole-Trimethoprim     Septra / Bactrim  --REACTION: rash  . Fentanyl Hives and Rash    REACTION: whelps    Past Surgical History  Procedure Laterality Date  . Cesarean section  1989 & 1993    X 2  . Cervical  laminectomy  2005 & 2009    X 2   NO ROM PROBLEMS  . Foot surgery  2005    RT HEEL  . Right knee  1981    ARTHROSCOPY AND ARTHROTOMY  . Nasal sinus surgery  1995 & 1997    X 2  . Laparoscopic gastric banding  12/30/09  . Esophagogastroduodenoscopy  06/29/2011    Procedure: ESOPHAGOGASTRODUODENOSCOPY (EGD);  Surgeon: Shann Medal, MD;  Location: [facility];  Service: General;  Laterality: N/A;  . Hysteroscopy  1999  . Laparoscopy  09/24/2011    Procedure: LAPAROSCOPY DIAGNOSTIC;  Surgeon: Pedro Earls, MD;  Location: [facility];  Service: General;  Laterality: N/A;  . Gastric banding port revision  09/24/2011    Procedure: GASTRIC BANDING PORT REVISION;  Surgeon: Pedro Earls, MD;  Location: [facility];  Service: General;  Laterality: N/A;  . Tonsillectomy  1971  . Abdominal hysterectomy  12/1999  . Cholecystectomy  1986  . Tubal ligation  1993    WITH C -SECTION  . Right knee arthroscopy and arthrotomy  12-1979  . Cervical conization w/bx  june 1990    dysplasia of cervix/used 5Fu cream for 3 months  . Laparoscopy  07/03/2012    Procedure: LAPAROSCOPY DIAGNOSTIC;  Surgeon: Pedro Earls, MD;  Location: [facility];  Service: General;  Laterality: N/A;  Exploratory Laparoscopy   . Laparoscopic revision of gastric band  07/03/2012    Procedure: LAPAROSCOPIC REVISION  OF GASTRIC BAND;  Surgeon: Pedro Earls, MD;  Location: [facility];  Service: General;  Laterality: N/A;  removal of old lap. band port, replaced with AP standard band  . Mesh applied to lap port  07/03/2012    Procedure: MESH APPLIED TO LAP PORT;  Surgeon: Pedro Earls, MD;  Location: [facility];  Service: General;  Laterality: N/A;  . Dilation and curettage of uterus   april-1989    missed abortion  . Laparoscopic repair and removal of gastric band N/A 07/02/2013    Procedure: LAPAROSCOPIC REMOVAL OF GASTRIC BAND;  Surgeon: Pedro Earls, MD;  Location: [facility];  Service: General;  Laterality: N/A;  . Laparoscopic gastric  sleeve resection N/A 07/02/2013    Procedure: LAPAROSCOPIC GASTRIC SLEEVE RESECTION upper endoscopy;  Surgeon: Pedro Earls, MD;  Location: [facility];  Service: General;  Laterality: N/A;   [name], DO No diagnosis found.  Doing very well after sleeve gastrectomy.  Weight down to 218 lbs.  Incisions OK.  Advancing diet appropriately.  Can return to work on Jan 19th.  Return 6 weeks [name] Done, MD, [facility] [phone] beeper [phone]  07/19/2013 9:37 AM

## 2013-07-20 ENCOUNTER — Encounter: Payer: Self-pay | Admitting: Podiatry

## 2013-07-20 ENCOUNTER — Ambulatory Visit (INDEPENDENT_AMBULATORY_CARE_PROVIDER_SITE_OTHER): Payer: 59 | Admitting: Podiatry

## 2013-07-20 VITALS — BP 133/80 | HR 64

## 2013-07-20 DIAGNOSIS — B351 Tinea unguium: Secondary | ICD-10-CM

## 2013-07-20 DIAGNOSIS — L600 Ingrowing nail: Secondary | ICD-10-CM | POA: Insufficient documentation

## 2013-07-20 MED ORDER — EFINACONAZOLE 10 % EX SOLN: 1.0000 "application " | Freq: Every morning | CUTANEOUS | Status: DC

## 2013-07-20 NOTE — Progress Notes (Signed)
Subjective:  58 year old female presents complaining of deformed nail with fungus on right great toe. Diabetic since 2007. NIDDM under control.   Review of Systems - General ROS: negative for - chills, fatigue, fever, night sweats, sleep disturbance, weight gain or weight loss Ophthalmic ROS: negative for - blurry vision, decreased vision, double vision, excessive tearing, eye pain or loss of vision ENT ROS: negative Breast ROS: negative for breast lumps Respiratory ROS: no cough, shortness of breath, or wheezing Cardiovascular ROS: no chest pain or dyspnea on exertion Gastrointestinal ROS: Recent surgery Gastric sleeve in December 2014. No complications.  Genito-Urinary ROS: no dysuria, trouble voiding, or hematuria Musculoskeletal ROS: negative Neurological ROS: No tingling or numbness.  Dermatological ROS: negative  Objective: Dermatolgic: Thick dystrophic incubated nail right great toe. No open lesions. Vascular: Pedal pulses are all palpable.  Orthopedic: Mild bunion bilateral, excess sagittal plane motion first ray bilateral, contracted 2nd digit right.  Neurolgic: There is decreased response to Monofilament sensory testing.   Assessment: Onychomycosis, onychocryptosis right great toe. Bilateral bunion. Hallux limitus with elevated first ray bilateral.   Plan: Reviewed findings. All nails debrided. Discussed topical antifungal treatment. Jublia prescribed.

## 2013-07-20 NOTE — Patient Instructions (Addendum)
Seen for abnormal right great toe nail. Noted deformed fungal infected right great toe nail.  Nail debrided. Will follow with topical anti fungal medication, Jublia.  Rx. Sent to [facility], [phone]. Return in 2 months.

## 2013-07-23 NOTE — Progress Notes (Signed)
Patient ID: [name], female   DOB: 02/09/1956, 57 y.o.   MRN: [id number] ATTENDING PHYSICIAN NOTE: I have reviewed the chart and agree with the plan as detailed above. Chella Chapdelaine MD Pager [phone]  

## 2013-07-23 NOTE — Progress Notes (Signed)
Patient ID: [name], female   DOB: 12/01/1955, 57 y.o.   MRN: [id number] ATTENDING PHYSICIAN NOTE: I have reviewed the chart and agree with the plan as detailed above. Dorcas Mcmurray MD Pager [phone]

## 2013-07-26 ENCOUNTER — Emergency Department
Admission: EM | Admit: 2013-07-26 | Discharge: 2013-07-26 | Disposition: A | Discharge status: Home / Self Care | Payer: 59 | Source: Home / Self Care | Attending: Family Medicine | Admitting: Family Medicine

## 2013-07-26 ENCOUNTER — Encounter: Payer: Self-pay | Admitting: Emergency Medicine

## 2013-07-26 DIAGNOSIS — J069 Acute upper respiratory infection, unspecified: Secondary | ICD-10-CM

## 2013-07-26 LAB — POCT INFLUENZA A/B
Influenza A, POC: NEGATIVE
Influenza B, POC: NEGATIVE

## 2013-07-26 MED ORDER — PREDNISONE 5 MG/5ML PO SOLN: 50.0000 mg | Freq: Every day | ORAL | Status: DC

## 2013-07-26 MED ORDER — DOXYCYCLINE HYCLATE 100 MG PO CAPS: 100.0000 mg | ORAL_CAPSULE | Freq: Two times a day (BID) | ORAL | Status: AC

## 2013-07-26 NOTE — ED Notes (Signed)
Patient c/o ear pain, headache, cough, nausea since last night. Patient states she has not tried anything otc.

## 2013-07-26 NOTE — ED Provider Notes (Signed)
CSN: [id number]     Arrival date & time 07/26/13  1128 History   First MD Initiated Contact with Patient 07/26/13 1155     Chief Complaint  Patient presents with  . Nausea  . Headache    HPI  URI Symptoms Onset: 1 day  Description: rhinorrhea, nasal congestion, cough  Modifying factors:  Prior diabetic. S/p gastric sleeve 1-2 months ago. Blood sugar now in 80s   Symptoms Nasal discharge: yes Fever: no Sore throat: no Cough: yes Wheezing: no Ear pain: no GI symptoms: no Sick contacts: yes  Red Flags  Stiff neck: no Dyspnea: no Rash: no Swallowing difficulty: no  Sinusitis Risk Factors Headache/face pain: no Double sickening: no tooth pain: no  Allergy Risk Factors Sneezing: no Itchy scratchy throat: no Seasonal symptoms: no  Flu Risk Factors Headache: no muscle aches: no severe fatigue: no   Past Medical History  Diagnosis Date  . Allergic rhinitis   . Menopause   . GERD (gastroesophageal reflux disease)   . IBS (irritable bowel syndrome)   . Anxiety   . Depression   . Hypertension   . Hyperlipemia   . Asthma     hx bronchial asthma at times with upper resp infection  . Headache(784.0)     migraine and cluster headaches  . Stress incontinence     at times  . Diabetes mellitus type II     type 2  . Neuromuscular disorder 2009    hx of fibromyalgia, polyarthralsia (surgery induced)   Past Surgical History  Procedure Laterality Date  . Cesarean section  1989 & 1993    X 2  . Cervical laminectomy  2005 & 2009    X 2   NO ROM PROBLEMS  . Foot surgery  2005    RT HEEL  . Right knee  1981    ARTHROSCOPY AND ARTHROTOMY  . Nasal sinus surgery  1995 & 1997    X 2  . Laparoscopic gastric banding  12/30/09  . Esophagogastroduodenoscopy  06/29/2011    Procedure: ESOPHAGOGASTRODUODENOSCOPY (EGD);  Surgeon: Shann Medal, MD;  Location: [facility];  Service: General;  Laterality: N/A;  . Hysteroscopy  1999  . Laparoscopy  09/24/2011    Procedure:  LAPAROSCOPY DIAGNOSTIC;  Surgeon: Pedro Earls, MD;  Location: [facility];  Service: General;  Laterality: N/A;  . Gastric banding port revision  09/24/2011    Procedure: GASTRIC BANDING PORT REVISION;  Surgeon: Pedro Earls, MD;  Location: [facility];  Service: General;  Laterality: N/A;  . Tonsillectomy  1971  . Abdominal hysterectomy  12/1999  . Cholecystectomy  1986  . Tubal ligation  1993    WITH C -SECTION  . Right knee arthroscopy and arthrotomy  12-1979  . Cervical conization w/bx  june 1990    dysplasia of cervix/used 5Fu cream for 3 months  . Laparoscopy  07/03/2012    Procedure: LAPAROSCOPY DIAGNOSTIC;  Surgeon: Pedro Earls, MD;  Location: [facility];  Service: General;  Laterality: N/A;  Exploratory Laparoscopy   . Laparoscopic revision of gastric band  07/03/2012    Procedure: LAPAROSCOPIC REVISION OF GASTRIC BAND;  Surgeon: Pedro Earls, MD;  Location: [facility];  Service: General;  Laterality: N/A;  removal of old lap. band port, replaced with AP standard band  . Mesh applied to lap port  07/03/2012    Procedure: MESH APPLIED TO LAP PORT;  Surgeon: Pedro Earls, MD;  Location: [facility];  Service: General;  Laterality: N/A;  . Dilation and curettage of uterus   april-1989    missed abortion  . Laparoscopic repair and removal of gastric band N/A 07/02/2013    Procedure: LAPAROSCOPIC REMOVAL OF GASTRIC BAND;  Surgeon: Pedro Earls, MD;  [facility];  Service: General;  Laterality: N/A;  . Laparoscopic gastric sleeve resection N/A 07/02/2013    Procedure: LAPAROSCOPIC GASTRIC SLEEVE RESECTION upper endoscopy;  Surgeon: Pedro Earls, MD;  [facility];  Service: General;  Laterality: N/A;   Family History  Problem Relation Age of Onset  . Diabetes Mother   . Stroke Mother   . Breast cancer Mother   . Atrial fibrillation Mother   . Hypertension Mother   . Cancer Mother     breast  . Diabetes Father   . Stroke Father   . Hypertension Father   . Heart  attack Father   . Thyroid disease    . Heart disease     History  Substance Use Topics  . Smoking status: Never Smoker   . Smokeless tobacco: Never Used  . Alcohol Use: No   OB History   Grav Para Term Preterm Abortions TAB SAB Ect Mult Living   3 0 0 0 1 0 1 0 0 2      Review of Systems  All other systems reviewed and are negative.    Allergies  Isoniazid; Nitrofurantoin; Hctz; Promethazine hcl; Macrolides and ketolides; Morphine; Percocet; Roxicet; Sulfamethoxazole-trimethoprim; and Fentanyl  Home Medications   Current Outpatient Rx  Name  Route  Sig  Dispense  Refill  . Ca Phosphate-Cholecalciferol (CALTRATE GUMMY BITES PO)   Oral   Take 2 tablets by mouth at bedtime.         . citalopram (CELEXA) 20 MG tablet   Oral   Take 1 tablet (20 mg total) by mouth every evening.   90 tablet   3   . cyclobenzaprine (FLEXERIL) 10 MG tablet   Oral   Take 10 mg by mouth 3 (three) times daily as needed for muscle spasms.         Marland Kitchen doxycycline (VIBRAMYCIN) 100 MG capsule   Oral   Take 1 capsule (100 mg total) by mouth 2 (two) times daily.   14 capsule   0   . Efinaconazole 10 % SOLN   Apply externally   Apply 1 application topically every morning.   1 Bottle   3   . estradiol (VIVELLE-DOT) 0.1 MG/24HR   Transdermal   Place 1 patch (0.1 mg total) onto the skin 2 (two) times a week. Monday and Thursday   8 patch   11   . furosemide (LASIX) 20 MG tablet   Oral   Take 1 tablet (20 mg total) by mouth every morning.   90 tablet   3   . glucose blood test strip      Use as instructed   100 each   12     aviva plus strips   . HYDROcodone-acetaminophen (HYCET) 7.5-325 mg/15 ml solution   Oral   Take 15 mLs by mouth 4 (four) times daily as needed for moderate pain.   200 mL   0   . HYDROcodone-acetaminophen (NORCO/VICODIN) 5-325 MG per tablet   Oral   Take 1 tablet by mouth every 6 (six) hours as needed for moderate pain.         Marland Kitchen loratadine  (CLARITIN) 10 MG tablet   Oral   Take 10 mg  by mouth every evening.          . olmesartan (BENICAR) 20 MG tablet   Oral   Take 0.5 tablets (10 mg total) by mouth at bedtime.   90 tablet   3   . Pediatric Multivit-Minerals-C (ONE-A-DAY SCOOBY-DOO GUMMIES PO)   Oral   Take 1 tablet by mouth daily.         . potassium chloride (KLOR-CON M10) 10 MEQ tablet   Oral   Take 1 tablet (10 mEq total) by mouth daily.   90 tablet   3   . rosuvastatin (CRESTOR) 20 MG tablet   Oral   Take 1 tablet (20 mg total) by mouth at bedtime.   90 tablet   3   . vitamin B-12 (CYANOCOBALAMIN) 100 MCG tablet   Oral   Take 100 mcg by mouth daily.         Marland Kitchen zolpidem (AMBIEN) 5 MG tablet   Oral   Take 1 tablet (5 mg total) by mouth at bedtime as needed for sleep.   30 tablet   0    BP 111/66  Pulse 69  Temp(Src) 98.4 F (36.9 C) (Oral)  Resp 16  Ht 5\' 11"  (1.803 m)  Wt 219 lb 4 oz (99.451 kg)  BMI 30.59 kg/m2  SpO2 97% Physical Exam  Constitutional: She appears well-developed and well-nourished.  HENT:  Head: Normocephalic and atraumatic.  Right Ear: External ear normal.  Left Ear: External ear normal.  +nasal erythema, rhinorrhea bilaterally, + post oropharyngeal erythema    Eyes: Conjunctivae are normal. Pupils are equal, round, and reactive to light.  Neck: Normal range of motion. Neck supple.  Cardiovascular: Normal rate and regular rhythm.   Pulmonary/Chest: Effort normal and breath sounds normal.  Abdominal: Soft. Bowel sounds are normal.  Musculoskeletal: Normal range of motion.  Neurological: She is alert.  Skin: Skin is warm.    ED Course  Procedures (including critical care time) Labs Review Labs Reviewed  POCT INFLUENZA A/B   Imaging Review No results found.  EKG Interpretation    Date/Time:    Ventricular Rate:    PR Interval:    QRS Duration:   QT Interval:    QTC Calculation:   R Axis:     Text Interpretation:              MDM   1.  URI (upper respiratory infection)    Likely viral illness  Will give ppx rx for doxy if sxs worsen/fail to improve PPx rx for prednisone if pt develops and wheezing/laryngospasm.  Overall reassuring exam today Discussed supportive care and infectious/resp red flags.  Follow up as needed.     The patient and/or caregiver has been counseled thoroughly with regard to treatment plan and/or medications prescribed including dosage, schedule, interactions, rationale for use, and possible side effects and they verbalize understanding. Diagnoses and expected course of recovery discussed and will return if not improved as expected or if the condition worsens. Patient and/or caregiver verbalized understanding.          Shanda Howells, MD 07/26/13 [phone]

## 2013-07-28 ENCOUNTER — Telehealth: Payer: Self-pay | Admitting: Emergency Medicine

## 2013-08-28 ENCOUNTER — Ambulatory Visit (INDEPENDENT_AMBULATORY_CARE_PROVIDER_SITE_OTHER): Payer: Self-pay | Admitting: Family Medicine

## 2013-08-28 VITALS — BP 134/72 | HR 66 | Ht 71.0 in | Wt 209.0 lb

## 2013-08-28 DIAGNOSIS — E119 Type 2 diabetes mellitus without complications: Secondary | ICD-10-CM

## 2013-08-28 NOTE — Progress Notes (Signed)
Patient presents today for DM follow-up as part of employer-sponsored Link to [employer]. Medications, glucose readings, a1c and compliance have been reviewed. I have also discussed with patient lifestyle interventions including diet and exercise. Details of the visit can be found in [employer] documenting program through [facility]). Patient has set a series of personal goals and will follow-up in no more than 3 months for further review of DM.   Patient has had good weight loss progress since recent bariatric surgery. Blood glucoses remain well controlled, no significant hypoglycemic events.

## 2013-08-30 ENCOUNTER — Encounter (INDEPENDENT_AMBULATORY_CARE_PROVIDER_SITE_OTHER): Payer: Self-pay | Admitting: Surgery

## 2013-08-30 ENCOUNTER — Ambulatory Visit (INDEPENDENT_AMBULATORY_CARE_PROVIDER_SITE_OTHER): Payer: Commercial Managed Care - PPO | Admitting: Surgery

## 2013-08-30 VITALS — BP 130/82 | HR 68 | Temp 97.8°F | Resp 16 | Ht 71.0 in | Wt 208.0 lb

## 2013-08-30 DIAGNOSIS — Z9884 Bariatric surgery status: Secondary | ICD-10-CM

## 2013-08-30 NOTE — Progress Notes (Signed)
[name] 58 y.o.  Body mass index is 29.02 kg/(m^2).  Patient Active Problem List   Diagnosis Date Noted  . Ingrown nail 07/20/2013  . 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 Obesity (BMI 30-39.9) 06/25/2013  . GERD (gastroesophageal reflux disease) 08/20/2011  . Lapband APL + [facility] repair June 2011-Major revision Dec 2013 06/29/2011  . ABDOMINAL PAIN, ACUTE 08/01/2010  . VITAMIN B12 DEFICIENCY 04/01/2010  . BARIATRIC SURGERY STATUS 01/29/2010  . ONYCHOMYCOSIS, BILATERAL 06/30/2009  . STRESS INCONTINENCE 06/10/2009  . ACUTE PHARYNGITIS 04/29/2009  . COUGH 04/27/2009  . BRONCHITIS, ACUTE 04/16/2009  . NAUSEA 04/08/2009  . DIARRHEA 04/08/2009  . MORBID OBESITY 03/03/2009  . SKIN RASH 11/18/2008  . UNSPECIFIED VITAMIN D DEFICIENCY 07/15/2008  . OTHER SPECIFIED ANEMIAS 07/15/2008  . Pain in Joint, Multiple Sites 06/13/2008  . Unspecified Myalgia and Myositis 06/13/2008  . BACK PAIN, LUMBAR 02/14/2008  . ACUTE SINUSITIS, UNSPECIFIED 09/01/2007  . CELLULITIS 08/12/2007  . ANXIETY 06/09/2007  . DEPRESSION 06/09/2007  . DIABETES MELLITUS, TYPE II 01/04/2007  . HYPERLIPIDEMIA NEC/NOS 01/04/2007  . HYPERTENSION, ESSENTIAL NOS 01/04/2007  . ALLERGIC RHINITIS 01/04/2007  . HEADACHE 01/04/2007    Allergies  Allergen Reactions  . Isoniazid     Severe SOB  . Nitrofurantoin Shortness Of Breath    REACTION: whelps  . Hctz [Hydrochlorothiazide]     rash  . Promethazine Hcl [Promethazine Hcl]     IF GIVEN  IV-hallucinations     CAN TAKE PO PHENERGAN  . Macrolides And Ketolides     rash  . Morphine     REACTION: severe vomiting  . Percocet [Oxycodone-Acetaminophen]     REACTION: severe itching  . Roxicet [Oxycodone-Acetaminophen]     itching  . Sulfamethoxazole-Trimethoprim     Septra / Bactrim  --REACTION: rash  . Fentanyl Hives and Rash    REACTION: whelps    Past Surgical History  Procedure Laterality Date  . Cesarean section  1989 &  1993    X 2  . Cervical laminectomy  2005 & 2009    X 2   NO ROM PROBLEMS  . Foot surgery  2005    RT HEEL  . Right knee  1981    ARTHROSCOPY AND ARTHROTOMY  . Nasal sinus surgery  1995 & 1997    X 2  . Laparoscopic gastric banding  12/30/09  . Esophagogastroduodenoscopy  06/29/2011    Procedure: ESOPHAGOGASTRODUODENOSCOPY (EGD);  Surgeon: Shann Medal, MD;  Location: [facility];  Service: General;  Laterality: N/A;  . Hysteroscopy  1999  . Laparoscopy  09/24/2011    Procedure: LAPAROSCOPY DIAGNOSTIC;  Surgeon: Pedro Earls, MD;  Location: [facility];  Service: General;  Laterality: N/A;  . Gastric banding port revision  09/24/2011    Procedure: GASTRIC BANDING PORT REVISION;  Surgeon: Pedro Earls, MD;  Location: [facility];  Service: General;  Laterality: N/A;  . Tonsillectomy  1971  . Abdominal hysterectomy  12/1999  . Cholecystectomy  1986  . Tubal ligation  1993    WITH C -SECTION  . Right knee arthroscopy and arthrotomy  12-1979  . Cervical conization w/bx  june 1990    dysplasia of cervix/used 5Fu cream for 3 months  . Laparoscopy  07/03/2012    Procedure: LAPAROSCOPY DIAGNOSTIC;  Surgeon: Pedro Earls, MD;  Location: [facility];  Service: General;  Laterality: N/A;  Exploratory Laparoscopy   . Laparoscopic revision of gastric band  07/03/2012  Procedure: LAPAROSCOPIC REVISION OF GASTRIC BAND;  Surgeon: Pedro Earls, MD;  Location: [facility];  Service: General;  Laterality: N/A;  removal of old lap. band port, replaced with AP standard band  . Mesh applied to lap port  07/03/2012    Procedure: MESH APPLIED TO LAP PORT;  Surgeon: Pedro Earls, MD;  Location: [facility];  Service: General;  Laterality: N/A;  . Dilation and curettage of uterus   april-1989    missed abortion  . Laparoscopic repair and removal of gastric band N/A 07/02/2013    Procedure: LAPAROSCOPIC REMOVAL OF GASTRIC BAND;  Surgeon: Pedro Earls, MD;  Location: [facility];  Service: General;  Laterality:  N/A;  . Laparoscopic gastric sleeve resection N/A 07/02/2013    Procedure: LAPAROSCOPIC GASTRIC SLEEVE RESECTION upper endoscopy;  Surgeon: Pedro Earls, MD;  Location: [facility];  Service: General;  Laterality: N/A;   [name], DO No diagnosis found.  Weight down to 208.  Wt down 23 lbs since surgery.  Incisions OK Return 3 months.   [name] Done, MD, [facility] [phone] beeper [phone]  08/30/2013 9:41 AM

## 2013-08-30 NOTE — Patient Instructions (Signed)
May return to full duty at [facility]

## 2013-08-31 NOTE — Progress Notes (Signed)
Patient ID: [name], female   DOB: 07/25/1955, 57 y.o.   MRN: [id number] ATTENDING PHYSICIAN NOTE: I have reviewed the chart and agree with the plan as detailed above. Hendrix Console MD Pager [phone]  

## 2013-09-03 ENCOUNTER — Encounter: Payer: 59 | Attending: Surgery | Admitting: Dietician

## 2013-09-03 VITALS — Ht 71.0 in | Wt 210.0 lb

## 2013-09-03 DIAGNOSIS — Z9884 Bariatric surgery status: Secondary | ICD-10-CM | POA: Insufficient documentation

## 2013-09-03 DIAGNOSIS — Z09 Encounter for follow-up examination after completed treatment for conditions other than malignant neoplasm: Secondary | ICD-10-CM | POA: Insufficient documentation

## 2013-09-03 DIAGNOSIS — Z713 Dietary counseling and surveillance: Secondary | ICD-10-CM | POA: Insufficient documentation

## 2013-09-03 DIAGNOSIS — E669 Obesity, unspecified: Secondary | ICD-10-CM

## 2013-09-03 NOTE — Progress Notes (Signed)
  Follow-up visit:  8  Weeks Post-Operative Gastric Sleeve Surgery  Medical Nutrition Therapy:  Appt start time: 0905 end time:  0935.  Primary concerns today: Post-operative Bariatric Surgery Nutrition Management. [name] returns today with a 7.5 lbs weight loss since Post Op class. Tolerating all foods fine.   Surgery date: 07/03/12 Surgery type: LAGB Revision Start weight at [facility]: 262.6 lbs  Surgery date: 07/02/2013 Surgery type: Gastric Sleeve  Weight today: 210.0 lbs  Weight change: 7.5 lbs  Total weight lost:  45.1 lbs  Weight goal: 180 lbs % goal met: 55%  TANITA  BODY COMP RESULTS  09/15/11 01/31/12 05/08/12 08/30/12 11/06/12 01/04/13 01/24/13 03/23/13 06/21/13 07/17/13 09/03/13   BMI (kg/m^2) 30.3 31.4 32.6 31.0 32.1 31.5 31.9 31.7 32.8 30.3 29.3   FM (lbs) 100.5 105.5 111.0 105.0 110.5 106.5 109.0 112.0 116.5 104.5 95.5   FFM (lbs) 116.0 120.0 123.0 117.5 119.5 119.5 120.0 115.0 118.5 113.0 114.5   TBW (lbs) 85.0 88.0 90.0 86.0 87.5 87.5 88.0 84.0 87.0 82.5 84.0    Preferred Learning Style:   No preference indicated   Learning Readiness:   Ready  24-hr recall: B (AM): Protein Shake (atkins)  Or egg with cheese (12-15 g) Snk (AM): Dannon Light and Fit (12 g)  L (PM): 3 oz grilled chicken and green beans or squash or ground beef and cheese  (21 g) Snk (PM): [place] and cheese (14 g) D PM): 3 oz chicken or flounder with collard (21 g) Snk (PM): none  Fluid intake:64 oz mostly water or crystal light Estimated total protein intake: 70-83 g   Medications: no longer taking Victoza Supplementation: taking  CBG monitoring: 3 x day  Average CBG per patient: 80-95 mg/dl fasting Last patient reported A1c: 6.4% in 06/25/13  Using straws: No Drinking while eating: No Hair loss: No Carbonated beverages: No N/V/D/C: No Dumping syndrome: No  Recent physical activity:  Walking 5600-7500 steps per day, Got cleared to exercise and planning to go the gym  Progress Towards  Goal(s):  In progress.  Handouts given during visit include:  Phase 3B High Protein + Non Starchy Vegetables   Nutritional Diagnosis:  [place]-3.3 Overweight/obesity related to past poor dietary habits and physical inactivity as evidenced by patient w/ recent Sleeve Gastrectomy surgery following dietary guidelines for continued weight loss.    Intervention:  Nutrition education/diet advancment.  Teaching Method Utilized:  Visual Auditory Hands on  Barriers to learning/adherence to lifestyle change: none  Demonstrated degree of understanding via:  Teach Back   Monitoring/Evaluation:  Dietary intake, exercise, lap band fills, and body weight. Follow up in 1 months for 3 month post-op visit.

## 2013-09-03 NOTE — Patient Instructions (Signed)
Goals:  Follow Phase 3B: High Protein + Non-Starchy Vegetables  Eat 3-6 small meals/snacks, every 3-5 hrs  Increase lean protein foods to meet 60g goal  Increase fluid intake to 64oz +  Avoid drinking 15 minutes before, during and 30 minutes after eating  Aim for >30 min of physical activity daily  

## 2013-09-18 ENCOUNTER — Encounter: Payer: Self-pay | Admitting: Podiatry

## 2013-09-18 ENCOUNTER — Ambulatory Visit (INDEPENDENT_AMBULATORY_CARE_PROVIDER_SITE_OTHER): Payer: 59 | Admitting: Podiatry

## 2013-09-18 VITALS — BP 131/80 | HR 67

## 2013-09-18 DIAGNOSIS — B351 Tinea unguium: Secondary | ICD-10-CM

## 2013-09-18 DIAGNOSIS — L600 Ingrowing nail: Secondary | ICD-10-CM

## 2013-09-18 NOTE — Patient Instructions (Signed)
Seen for hypertrophic mycotic nails. All nails debrided. Continue with Jublia. Return in 2 months or as needed.

## 2013-09-18 NOTE — Progress Notes (Signed)
Follow up on fungal nail treatment with Jublia.  Patient stated that she is able to see improvement.  Objective: Thick deformed and ingrown nail both great toe with deformity on right.   Assessment: Ingrown hallucal nails. Mycotic nails both great toe nails.  Plan: Reviewed findings and available options. Continue with Jublia. All nails debrided. Repeat RFC in 2 months.

## 2013-10-10 ENCOUNTER — Ambulatory Visit: Payer: 59 | Admitting: Dietician

## 2013-10-18 ENCOUNTER — Encounter: Payer: 59 | Attending: Surgery | Admitting: Dietician

## 2013-10-18 VITALS — Ht 71.0 in | Wt 201.5 lb

## 2013-10-18 DIAGNOSIS — E785 Hyperlipidemia, unspecified: Secondary | ICD-10-CM

## 2013-10-18 DIAGNOSIS — Z713 Dietary counseling and surveillance: Secondary | ICD-10-CM | POA: Insufficient documentation

## 2013-10-18 DIAGNOSIS — Z09 Encounter for follow-up examination after completed treatment for conditions other than malignant neoplasm: Secondary | ICD-10-CM | POA: Insufficient documentation

## 2013-10-18 DIAGNOSIS — Z9884 Bariatric surgery status: Secondary | ICD-10-CM | POA: Insufficient documentation

## 2013-10-18 NOTE — Progress Notes (Signed)
  Follow-up visit:  8  Weeks Post-Operative Gastric Sleeve Surgery  Medical Nutrition Therapy:  Appt start time: 0800 end time:  0830.  Primary concerns today: Post-operative Bariatric Surgery Nutrition Management. [name] returns today with a 8.5 lbs weight loss since Post Op class. Tolerating all foods fine. Had one low blood sugar (60 mg/dl) at 12:30.   Surgery date: 07/03/12 Surgery type: LAGB Revision Start weight at [facility]: 262.6 lbs  Surgery date: 07/02/2013 Surgery type: Gastric Sleeve  Weight today: 201.5 lbs  Weight change: 8.5 lbs  Total weight lost:  61.1 lbs  Weight goal: 180 lbs % goal met: 55%  TANITA  BODY COMP RESULTS  09/15/11 01/31/12 05/08/12 08/30/12 11/06/12 01/04/13 01/24/13 03/23/13 06/21/13 07/17/13 09/03/13 10/18/13   BMI (kg/m^2) 30.3 31.4 32.6 31.0 32.1 31.5 31.9 31.7 32.8 30.3 29.3 28.1   FM (lbs) 100.5 105.5 111.0 105.0 110.5 106.5 109.0 112.0 116.5 104.5 95.5 89.5   FFM (lbs) 116.0 120.0 123.0 117.5 119.5 119.5 120.0 115.0 118.5 113.0 114.5 112.0   TBW (lbs) 85.0 88.0 90.0 86.0 87.5 87.5 88.0 84.0 87.0 82.5 84.0 82.0    Preferred Learning Style:   No preference indicated   Learning Readiness:   Ready  24-hr recall: B (AM): Protein Shake (atkins)  Or egg with cheese (12-15 g) Snk (AM): [place] Light and Fit (12 g)  L (PM): 3 oz grilled chicken and salad and green beans or squash or ground beef and cheese  (21 g) Snk (PM): [place] and cheese (14 g) D PM): 3 oz chicken or flounder with collards (21 g) Snk (PM): none  Fluid intake:64 oz mostly water or crystal light Estimated total protein intake: 70-83 g   Medications: no longer taking Victoza Supplementation: taking  CBG monitoring: 3 x day  Average CBG per patient: 80-95 mg/dl fasting Last patient reported A1c: 6.4% in 06/25/13  Using straws: No Drinking while eating: No Hair loss: No Carbonated beverages: No N/V/D/C: Ate one bite too much one time Dumping syndrome: No  Recent physical  activity:  Walking 5600-7500 steps per day, treadmill at home   Progress Towards Goal(s):  In progress.  Nutritional Diagnosis:  [place]-3.3 Overweight/obesity related to past poor dietary habits and physical inactivity as evidenced by patient w/ recent Sleeve Gastrectomy surgery following dietary guidelines for continued weight loss.    Intervention:  Nutrition education/diet reinforcment.  Teaching Method Utilized:  Visual Auditory Hands on  Barriers to learning/adherence to lifestyle change: none  Demonstrated degree of understanding via:  Teach Back   Monitoring/Evaluation:  Dietary intake, exercise, and body weight. Follow up in 3 months for 6 month post-op visit.

## 2013-10-18 NOTE — Patient Instructions (Signed)
Goals:  Follow Phase 3B: High Protein + Non-Starchy Vegetables  Eat 3-6 small meals/snacks, every 3-5 hrs  Increase lean protein foods to meet 60g goal  Increase fluid intake to 64oz +  Avoid drinking 15 minutes before, during and 30 minutes after eating  Aim for >30 min of physical activity daily  If your blood sugar keeps going low, add about 15 g of carbohydrate along with protein in the middle of day 

## 2013-11-20 ENCOUNTER — Ambulatory Visit: Payer: 59 | Admitting: Podiatry

## 2013-11-21 ENCOUNTER — Encounter: Payer: Self-pay | Admitting: Podiatry

## 2013-11-21 ENCOUNTER — Ambulatory Visit (INDEPENDENT_AMBULATORY_CARE_PROVIDER_SITE_OTHER): Payer: 59 | Admitting: Podiatry

## 2013-11-21 VITALS — BP 120/68 | HR 68 | Ht 71.0 in | Wt 196.0 lb

## 2013-11-21 DIAGNOSIS — L600 Ingrowing nail: Secondary | ICD-10-CM

## 2013-11-21 DIAGNOSIS — M79606 Pain in leg, unspecified: Secondary | ICD-10-CM | POA: Insufficient documentation

## 2013-11-21 DIAGNOSIS — M79609 Pain in unspecified limb: Secondary | ICD-10-CM

## 2013-11-21 DIAGNOSIS — B351 Tinea unguium: Secondary | ICD-10-CM

## 2013-11-21 NOTE — Patient Instructions (Signed)
Seen for abnormal nail growth. In need of regular trimming. All nails debrided. Return in 2 month.

## 2013-11-21 NOTE — Progress Notes (Signed)
Seen for abnormal nail growth.  Objective: Thick dystrophic nail with uneven nail bed right hallux. Thick dystrophic nail with callus at distal end of 2nd digit right.  No other lesions or abnormal findings.  Assessment: Onychomycosis x 10. Dystrophic nail right 1, 2.   Plan: All nails debrided.  Return in 2 month.

## 2013-11-26 ENCOUNTER — Other Ambulatory Visit (INDEPENDENT_AMBULATORY_CARE_PROVIDER_SITE_OTHER): Payer: Self-pay | Admitting: Surgery

## 2013-11-27 LAB — HEPATIC FUNCTION PANEL
ALT: 11 U/L (ref 0–35)
AST: 12 U/L (ref 0–37)
Albumin: 3.3 g/dL — ABNORMAL LOW (ref 3.5–5.2)
Alkaline Phosphatase: 56 U/L (ref 39–117)
Bilirubin, Direct: <0.1 mg/dL (ref 0.0–0.3)
Total Bilirubin: 0.2 mg/dL — ABNORMAL LOW (ref 0.3–1.2)
Total Protein: 6.1 g/dL (ref 6.0–8.3)

## 2013-11-28 LAB — LIPID PANEL

## 2013-12-02 ENCOUNTER — Encounter: Payer: Self-pay | Admitting: Emergency Medicine

## 2013-12-02 ENCOUNTER — Emergency Department
Admission: EM | Admit: 2013-12-02 | Discharge: 2013-12-02 | Disposition: A | Discharge status: Home / Self Care | Payer: 59 | Source: Home / Self Care | Attending: Family Medicine | Admitting: Family Medicine

## 2013-12-02 DIAGNOSIS — R234 Changes in skin texture: Secondary | ICD-10-CM

## 2013-12-02 DIAGNOSIS — L02419 Cutaneous abscess of limb, unspecified: Secondary | ICD-10-CM

## 2013-12-02 DIAGNOSIS — L03119 Cellulitis of unspecified part of limb: Secondary | ICD-10-CM

## 2013-12-02 DIAGNOSIS — L988 Other specified disorders of the skin and subcutaneous tissue: Secondary | ICD-10-CM

## 2013-12-02 DIAGNOSIS — L03115 Cellulitis of right lower limb: Secondary | ICD-10-CM

## 2013-12-02 MED ORDER — MUPIROCIN CALCIUM 2 % EX CREA: 1.0000 "application " | TOPICAL_CREAM | Freq: Three times a day (TID) | CUTANEOUS | Status: DC

## 2013-12-02 MED ORDER — DOXYCYCLINE HYCLATE 100 MG PO CAPS: 100.0000 mg | ORAL_CAPSULE | Freq: Two times a day (BID) | ORAL | Status: DC

## 2013-12-02 NOTE — ED Provider Notes (Signed)
CSN: [id number]     Arrival date & time 12/02/13  1109 History   First MD Initiated Contact with Patient 12/02/13 1141     Chief Complaint  Patient presents with  . Leg Problem      HPI Comments: Patient states that she had her right second toenail trimmed about 10 days ago.  She subsequently developed a fissure at the tip of her toe that has persisted although not tender to palpation.  She has also developed an area of erythema on her right lower shin that is tender to palpation.  No fevers, chills, and sweats.  Last Td 01/02/09  Patient is a 58 y.o. female presenting with rash. The history is provided by the patient.  Rash Pain location: right shin. Pain quality: aching   Pain severity:  Mild Onset quality:  Gradual Duration:  2 days Timing:  Constant Progression:  Worsening Chronicity:  New Context comment:  Toenail trim Worsened by:  Nothing tried Associated symptoms: no anorexia, no chills, no fever and no nausea   Risk factors comment:  Diabetes   Past Medical History  Diagnosis Date  . Allergic rhinitis   . Menopause   . GERD (gastroesophageal reflux disease)   . IBS (irritable bowel syndrome)   . Anxiety   . Depression   . Hypertension   . Hyperlipemia   . Asthma     hx bronchial asthma at times with upper resp infection  . Headache(784.0)     migraine and cluster headaches  . Stress incontinence     at times  . Neuromuscular disorder 2009    hx of fibromyalgia, polyarthralsia (surgery induced)   Past Surgical History  Procedure Laterality Date  . Cesarean section  1989 & 1993    X 2  . Cervical laminectomy  2005 & 2009    X 2   NO ROM PROBLEMS  . Foot surgery  2005    RT HEEL  . Right knee  1981    ARTHROSCOPY AND ARTHROTOMY  . Nasal sinus surgery  1995 & 1997    X 2  . Laparoscopic gastric banding  12/30/09  . Esophagogastroduodenoscopy  06/29/2011    Procedure: ESOPHAGOGASTRODUODENOSCOPY (EGD);  Surgeon: Shann Medal, MD;  Location: [facility];   Service: General;  Laterality: N/A;  . Hysteroscopy  1999  . Laparoscopy  09/24/2011    Procedure: LAPAROSCOPY DIAGNOSTIC;  Surgeon: Pedro Earls, MD;  Location: [facility];  Service: General;  Laterality: N/A;  . Gastric banding port revision  09/24/2011    Procedure: GASTRIC BANDING PORT REVISION;  Surgeon: Pedro Earls, MD;  Location: [facility];  Service: General;  Laterality: N/A;  . Tonsillectomy  1971  . Abdominal hysterectomy  12/1999  . Cholecystectomy  1986  . Tubal ligation  1993    WITH C -SECTION  . Right knee arthroscopy and arthrotomy  12-1979  . Cervical conization w/bx  june 1990    dysplasia of cervix/used 5Fu cream for 3 months  . Laparoscopy  07/03/2012    Procedure: LAPAROSCOPY DIAGNOSTIC;  Surgeon: Pedro Earls, MD;  Location: [facility];  Service: General;  Laterality: N/A;  Exploratory Laparoscopy   . Laparoscopic revision of gastric band  07/03/2012    Procedure: LAPAROSCOPIC REVISION OF GASTRIC BAND;  Surgeon: Pedro Earls, MD;  Location: [facility];  Service: General;  Laterality: N/A;  removal of old lap. band port, replaced with AP standard band  . Mesh applied to lap port  07/03/2012    Procedure: MESH APPLIED TO LAP PORT;  Surgeon: Pedro Earls, MD;  Location: [facility];  Service: General;  Laterality: N/A;  . Dilation and curettage of uterus   april-1989    missed abortion  . Laparoscopic repair and removal of gastric band N/A 07/02/2013    Procedure: LAPAROSCOPIC REMOVAL OF GASTRIC BAND;  Surgeon: Pedro Earls, MD;  Location: [facility];  Service: General;  Laterality: N/A;  . Laparoscopic gastric sleeve resection N/A 07/02/2013    Procedure: LAPAROSCOPIC GASTRIC SLEEVE RESECTION upper endoscopy;  Surgeon: Pedro Earls, MD;  [facility]: [facility];  Service: General;  Laterality: N/A;   Family History  Problem Relation Age of Onset  . Diabetes Mother   . Stroke Mother   . Breast cancer Mother   . Atrial fibrillation Mother   . Hypertension Mother   .  Cancer Mother     breast  . Diabetes Father   . Stroke Father   . Hypertension Father   . Heart attack Father   . Thyroid disease    . Heart disease     History  Substance Use Topics  . Smoking status: Never Smoker   . Smokeless tobacco: Never Used  . Alcohol Use: No   OB History   Grav Para Term Preterm Abortions TAB SAB Ect Mult Living   3 0 0 0 1 0 1 0 0 2      Review of Systems  Constitutional: Negative for fever and chills.  Gastrointestinal: Negative for nausea and anorexia.  Skin: Positive for rash.    Allergies  Isoniazid; Nitrofurantoin; Hctz; Promethazine hcl; Macrolides and ketolides; Morphine; Percocet; Roxicet; Sulfamethoxazole-trimethoprim; and Fentanyl  Home Medications   Prior to Admission medications   Medication Sig Start Date End Date Taking? Authorizing Provider  Ca Phosphate-Cholecalciferol (CALTRATE GUMMY BITES PO) Take 2 tablets by mouth at bedtime.    Historical Provider, MD  citalopram (CELEXA) 20 MG tablet Take 1 tablet (20 mg total) by mouth every evening. 06/25/13   [name], DO  cyclobenzaprine (FLEXERIL) 10 MG tablet Take 10 mg by mouth 3 (three) times daily as needed for muscle spasms.    Historical Provider, MD  Efinaconazole 10 % SOLN Apply 1 application topically every morning. 07/20/13   Myeong Sheard, DPM  estradiol (VIVELLE-DOT) 0.1 MG/24HR Place 1 patch (0.1 mg total) onto the skin 2 (two) times a week. Monday and Thursday 06/27/12   [name], MD  furosemide (LASIX) 20 MG tablet Take 1 tablet (20 mg total) by mouth every morning. 06/25/13   [name], DO  glucose blood test strip Use as instructed 06/25/13   [name], DO  HYDROcodone-acetaminophen (HYCET) 7.5-325 mg/15 ml solution Take 15 mLs by mouth 4 (four) times daily as needed for moderate pain. 07/04/13 07/04/14  Pedro Earls, MD  HYDROcodone-acetaminophen (NORCO/VICODIN) 5-325 MG per tablet Take 1 tablet by mouth every 6 (six) hours as needed for moderate  pain.    Historical Provider, MD  loratadine (CLARITIN) 10 MG tablet Take 10 mg by mouth every evening.     Historical Provider, MD  olmesartan (BENICAR) 20 MG tablet Take 0.5 tablets (10 mg total) by mouth at bedtime. 06/25/13   [name], DO  Pediatric Multivit-Minerals-C (ONE-A-DAY SCOOBY-DOO GUMMIES PO) Take 1 tablet by mouth daily.    Historical Provider, MD  potassium chloride (KLOR-CON M10) 10 MEQ tablet Take 1 tablet (10 mEq total) by mouth daily. 06/25/13   Kendrick Fries  R Lowne, DO  predniSONE 5 MG/5ML solution Take 50 mLs (50 mg total) by mouth daily with breakfast. 07/26/13   Shanda Howells, MD  rosuvastatin (CRESTOR) 20 MG tablet Take 1 tablet (20 mg total) by mouth at bedtime. 06/25/13   [name], DO  vitamin B-12 (CYANOCOBALAMIN) 100 MCG tablet Take 100 mcg by mouth daily.    Historical Provider, MD  zolpidem (AMBIEN) 5 MG tablet Take 1 tablet (5 mg total) by mouth at bedtime as needed for sleep. 06/25/13   Alferd Apa Lowne, DO   BP 119/72  Pulse 64  Temp(Src) 98.1 F (36.7 C) (Oral)  Resp 16  Ht 5\' 11"  (1.803 m)  Wt 197 lb (89.359 kg)  BMI 27.49 kg/m2  SpO2 98% Physical Exam  Nursing note and vitals reviewed. Constitutional: She is oriented to person, place, and time. She appears well-developed and well-nourished. No distress.  HENT:  Head: Normocephalic.  Mouth/Throat: Oropharynx is clear and moist.  Eyes: Conjunctivae are normal. Pupils are equal, round, and reactive to light.  Pulmonary/Chest: Breath sounds normal.  Abdominal: Bowel sounds are normal.  Musculoskeletal:       Feet:  Right second toe has fissure at the very tip without swelling or drainage.  Slightly erythematous but non-tender.  Lymphadenopathy:    She has no cervical adenopathy.  Neurological: She is alert and oriented to person, place, and time.  Skin: Skin is warm and dry. Rash noted. Rash is macular.     There is an erythematous macule right shin about 6cm dia mildly tender to palpation but  not fluctuant or swollen.    ED Course  Procedures  none      MDM   1. Cellulitis of right anterior lower leg   2. Fissure in skin of foot (toe)   Begin doxycycline.  Rx for Bactroban.  Apply Bactroban to fissure on 2nd toe. Change bandage on toe daily until healed. Followup with PCP if not improved 5 days    Kandra Nicolas, MD 12/06/13 1626

## 2013-12-02 NOTE — ED Notes (Signed)
Reports reddened area on right lower shin that is tender to touch; had diabetic foot care appt. last week with nail trim leading to more bleeding than usual and worries that this distal erythema might be related.

## 2013-12-02 NOTE — Discharge Instructions (Signed)
Change bandage on toe daily until healed.   Cellulitis Cellulitis is an infection of the skin and the tissue beneath it. The infected area is usually red and tender. Cellulitis occurs most often in the arms and lower legs.  CAUSES  Cellulitis is caused by bacteria that enter the skin through cracks or cuts in the skin. The most common types of bacteria that cause cellulitis are Staphylococcus and Streptococcus. SYMPTOMS   Redness and warmth.  Swelling.  Tenderness or pain.  Fever. DIAGNOSIS  Your caregiver can usually determine what is wrong based on a physical exam. Blood tests may also be done. TREATMENT  Treatment usually involves taking an antibiotic medicine. HOME CARE INSTRUCTIONS   Take your antibiotics as directed. Finish them even if you start to feel better.  Keep the infected arm or leg elevated to reduce swelling.  Apply a warm cloth to the affected area up to 4 times per day to relieve pain.  Only take over-the-counter or prescription medicines for pain, discomfort, or fever as directed by your caregiver.  Keep all follow-up appointments as directed by your caregiver. SEEK MEDICAL CARE IF:   You notice red streaks coming from the infected area.  Your red area gets larger or turns dark in color.  Your bone or joint underneath the infected area becomes painful after the skin has healed.  Your infection returns in the same area or another area.  You notice a swollen bump in the infected area.  You develop new symptoms. SEEK IMMEDIATE MEDICAL CARE IF:   You have a fever.  You feel very sleepy.  You develop vomiting or diarrhea.  You have a general ill feeling (malaise) with muscle aches and pains. MAKE SURE YOU:   Understand these instructions.  Will watch your condition.  Will get help right away if you are not doing well or get worse. Document Released: 04/07/2005 Document Revised: 12/28/2011 Document Reviewed: 09/13/2011 [facility] Patient  Information 2014 [place].

## 2013-12-13 ENCOUNTER — Encounter (INDEPENDENT_AMBULATORY_CARE_PROVIDER_SITE_OTHER): Payer: Self-pay | Admitting: Surgery

## 2013-12-13 ENCOUNTER — Ambulatory Visit (INDEPENDENT_AMBULATORY_CARE_PROVIDER_SITE_OTHER): Payer: Commercial Managed Care - PPO | Admitting: Surgery

## 2013-12-13 VITALS — BP 124/80 | HR 66 | Ht 71.0 in | Wt 195.0 lb

## 2013-12-13 DIAGNOSIS — Z9884 Bariatric surgery status: Secondary | ICD-10-CM

## 2013-12-13 NOTE — Progress Notes (Signed)
[name] 58 y.o.  Body mass index is 27.21 kg/(m^2).  Patient Active Problem List   Diagnosis Date Noted  . Pain in lower limb 11/21/2013  . Ingrown nail 07/20/2013  . 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 Obesity (BMI 30-39.9) 06/25/2013  . GERD (gastroesophageal reflux disease) 08/20/2011  . Lapband APL + [facility] repair June 2011-Major revision Dec 2013 06/29/2011  . ABDOMINAL PAIN, ACUTE 08/01/2010  . VITAMIN B12 DEFICIENCY 04/01/2010  . BARIATRIC SURGERY STATUS 01/29/2010  . ONYCHOMYCOSIS, BILATERAL 06/30/2009  . STRESS INCONTINENCE 06/10/2009  . ACUTE PHARYNGITIS 04/29/2009  . COUGH 04/27/2009  . BRONCHITIS, ACUTE 04/16/2009  . NAUSEA 04/08/2009  . DIARRHEA 04/08/2009  . MORBID OBESITY 03/03/2009  . SKIN RASH 11/18/2008  . UNSPECIFIED VITAMIN D DEFICIENCY 07/15/2008  . OTHER SPECIFIED ANEMIAS 07/15/2008  . Pain in Joint, Multiple Sites 06/13/2008  . Unspecified Myalgia and Myositis 06/13/2008  . BACK PAIN, LUMBAR 02/14/2008  . ACUTE SINUSITIS, UNSPECIFIED 09/01/2007  . CELLULITIS 08/12/2007  . ANXIETY 06/09/2007  . DEPRESSION 06/09/2007  . DIABETES MELLITUS, TYPE II 01/04/2007  . HYPERLIPIDEMIA NEC/NOS 01/04/2007  . HYPERTENSION, ESSENTIAL NOS 01/04/2007  . ALLERGIC RHINITIS 01/04/2007  . HEADACHE 01/04/2007    Allergies  Allergen Reactions  . Isoniazid     Severe SOB  . Nitrofurantoin Shortness Of Breath    REACTION: whelps  . Hctz [Hydrochlorothiazide]     rash  . Promethazine Hcl [Promethazine Hcl]     IF GIVEN  IV-hallucinations     CAN TAKE PO PHENERGAN  . Macrolides And Ketolides     rash  . Morphine     REACTION: severe vomiting  . Percocet [Oxycodone-Acetaminophen]     REACTION: severe itching  . Roxicet [Oxycodone-Acetaminophen]     itching  . Sulfamethoxazole-Trimethoprim     Septra / Bactrim  --REACTION: rash  . Fentanyl Hives and Rash    REACTION: whelps    Past Surgical History  Procedure Laterality  Date  . Cesarean section  1989 & 1993    X 2  . Cervical laminectomy  2005 & 2009    X 2   NO ROM PROBLEMS  . Foot surgery  2005    RT HEEL  . Right knee  1981    ARTHROSCOPY AND ARTHROTOMY  . Nasal sinus surgery  1995 & 1997    X 2  . Laparoscopic gastric banding  12/30/09  . Esophagogastroduodenoscopy  06/29/2011    Procedure: ESOPHAGOGASTRODUODENOSCOPY (EGD);  Surgeon: Shann Medal, MD;  Location: [facility];  Service: General;  Laterality: N/A;  . Hysteroscopy  1999  . Laparoscopy  09/24/2011    Procedure: LAPAROSCOPY DIAGNOSTIC;  Surgeon: Pedro Earls, MD;  Location: [facility];  Service: General;  Laterality: N/A;  . Gastric banding port revision  09/24/2011    Procedure: GASTRIC BANDING PORT REVISION;  Surgeon: Pedro Earls, MD;  Location: [facility];  Service: General;  Laterality: N/A;  . Tonsillectomy  1971  . Abdominal hysterectomy  12/1999  . Cholecystectomy  1986  . Tubal ligation  1993    WITH C -SECTION  . Right knee arthroscopy and arthrotomy  12-1979  . Cervical conization w/bx  june 1990    dysplasia of cervix/used 5Fu cream for 3 months  . Laparoscopy  07/03/2012    Procedure: LAPAROSCOPY DIAGNOSTIC;  Surgeon: Pedro Earls, MD;  Location: [facility];  Service: General;  Laterality: N/A;  Exploratory Laparoscopy   . Laparoscopic  revision of gastric band  07/03/2012    Procedure: LAPAROSCOPIC REVISION OF GASTRIC BAND;  Surgeon: Pedro Earls, MD;  Location: [facility];  Service: General;  Laterality: N/A;  removal of old lap. band port, replaced with AP standard band  . Mesh applied to lap port  07/03/2012    Procedure: MESH APPLIED TO LAP PORT;  Surgeon: Pedro Earls, MD;  Location: [facility];  Service: General;  Laterality: N/A;  . Dilation and curettage of uterus   april-1989    missed abortion  . Laparoscopic repair and removal of gastric band N/A 07/02/2013    Procedure: LAPAROSCOPIC REMOVAL OF GASTRIC BAND;  Surgeon: Pedro Earls, MD;  Location: [facility];   Service: General;  Laterality: N/A;  . Laparoscopic gastric sleeve resection N/A 07/02/2013    Procedure: LAPAROSCOPIC GASTRIC SLEEVE RESECTION upper endoscopy;  Surgeon: Pedro Earls, MD;  Location: [facility];  Service: General;  Laterality: N/A;   [name], DO No diagnosis found.  Doing very well after sleeve gastrectomy.  Diabetes has resolved.  Wt 195 with a goal of 170.  Will get Dr. Etter Sjogren to check vit levels mainly Fe++. [name] Done, MD, [facility] [phone] beeper [phone]  12/13/2013 9:43 AM

## 2013-12-13 NOTE — Patient Instructions (Signed)
Thanks for your patience.  If you need further assistance after leaving the office, please call our office and speak with a [facility] nurse.  [phone].  If you want to leave a message for Dr. Aaryan Essman, please call his office phone at [phone]. 

## 2013-12-18 ENCOUNTER — Ambulatory Visit (INDEPENDENT_AMBULATORY_CARE_PROVIDER_SITE_OTHER): Payer: 59 | Admitting: Podiatry

## 2013-12-18 ENCOUNTER — Encounter: Payer: Self-pay | Admitting: Podiatry

## 2013-12-18 VITALS — BP 137/73 | HR 61

## 2013-12-18 DIAGNOSIS — M79606 Pain in leg, unspecified: Secondary | ICD-10-CM

## 2013-12-18 DIAGNOSIS — R234 Changes in skin texture: Secondary | ICD-10-CM

## 2013-12-18 DIAGNOSIS — M79609 Pain in unspecified limb: Secondary | ICD-10-CM

## 2013-12-18 DIAGNOSIS — L988 Other specified disorders of the skin and subcutaneous tissue: Secondary | ICD-10-CM

## 2013-12-18 DIAGNOSIS — Q828 Other specified congenital malformations of skin: Secondary | ICD-10-CM | POA: Insufficient documentation

## 2013-12-18 NOTE — Patient Instructions (Signed)
Seen for cracked skin on 2nd digit right. Thick callused skin debrided and padded. Return in 2 weeks.

## 2013-12-18 NOTE — Progress Notes (Signed)
58 year old came in with cracked callus at tip of left foot. She was on vacation over the weekend when the called toe cracked and got open. She was treated at ER with antibiotic oral and topical medication.  Objective: Left 2nd toe distal end has excess callus with horizontal fissure deep down to subdermal layer. No edema or erythema noted. Callused tissue is soft from antibiotic wound care last few days.   Contracted digit 2nd left.  Assessment: Fissured callus distal end 2nd digit left without infection.  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  Plan: Excess keratotic tissue debrided. Dressed with Amerigel and buttress pad placed. Return in 2 week.

## 2013-12-24 ENCOUNTER — Encounter: Payer: Self-pay | Admitting: Family Medicine

## 2013-12-24 ENCOUNTER — Ambulatory Visit (INDEPENDENT_AMBULATORY_CARE_PROVIDER_SITE_OTHER): Payer: 59 | Admitting: Family Medicine

## 2013-12-24 VITALS — BP 115/72 | HR 67 | Temp 98.4°F | Wt 194.2 lb

## 2013-12-24 DIAGNOSIS — E785 Hyperlipidemia, unspecified: Secondary | ICD-10-CM

## 2013-12-24 DIAGNOSIS — E1159 Type 2 diabetes mellitus with other circulatory complications: Secondary | ICD-10-CM

## 2013-12-24 DIAGNOSIS — E2839 Other primary ovarian failure: Secondary | ICD-10-CM

## 2013-12-24 DIAGNOSIS — K912 Postsurgical malabsorption, not elsewhere classified: Secondary | ICD-10-CM

## 2013-12-24 LAB — POCT URINALYSIS DIPSTICK
Bilirubin, UA: NEGATIVE
Blood, UA: NEGATIVE
Glucose, UA: NEGATIVE
Ketones, UA: NEGATIVE
Leukocytes, UA: NEGATIVE
Nitrite, UA: NEGATIVE
Protein, UA: NEGATIVE
Spec Grav, UA: 1.01
Urobilinogen, UA: 0.2
pH, UA: 6.5

## 2013-12-24 LAB — CBC WITH DIFFERENTIAL/PLATELET
Basophils Absolute: 0.1 10*3/uL (ref 0.0–0.1)
Basophils Relative: 0.5 % (ref 0.0–3.0)
Eosinophils Absolute: 0.2 10*3/uL (ref 0.0–0.7)
Eosinophils Relative: 1.6 % (ref 0.0–5.0)
HCT: 42.4 % (ref 36.0–46.0)
Hemoglobin: 13.8 g/dL (ref 12.0–15.0)
Lymphocytes Relative: 23.2 % (ref 12.0–46.0)
Lymphs Abs: 2.4 10*3/uL (ref 0.7–4.0)
MCHC: 32.5 g/dL (ref 30.0–36.0)
MCV: 86.2 fl (ref 78.0–100.0)
Monocytes Absolute: 0.7 10*3/uL (ref 0.1–1.0)
Monocytes Relative: 6.7 % (ref 3.0–12.0)
Neutro Abs: 7.2 10*3/uL (ref 1.4–7.7)
Neutrophils Relative %: 68 % (ref 43.0–77.0)
Platelets: 253 10*3/uL (ref 150.0–400.0)
RBC: 4.93 Mil/uL (ref 3.87–5.11)
RDW: 13.4 % (ref 11.5–15.5)
WBC: 10.6 10*3/uL — ABNORMAL HIGH (ref 4.0–10.5)

## 2013-12-24 LAB — BASIC METABOLIC PANEL
BUN: 15 mg/dL (ref 6–23)
CO2: 31 mEq/L (ref 19–32)
Calcium: 9.7 mg/dL (ref 8.4–10.5)
Chloride: 98 mEq/L (ref 96–112)
Creatinine, Ser: 0.9 mg/dL (ref 0.4–1.2)
GFR: 69.49 mL/min (ref >60.00)
Glucose, Bld: 101 mg/dL — ABNORMAL HIGH (ref 70–99)
Potassium: 3.9 mEq/L (ref 3.5–5.1)
Sodium: 138 mEq/L (ref 135–145)

## 2013-12-24 LAB — HEPATIC FUNCTION PANEL
ALT: 14 U/L (ref 0–35)
AST: 19 U/L (ref 0–37)
Albumin: 4.6 g/dL (ref 3.5–5.2)
Alkaline Phosphatase: 77 U/L (ref 39–117)
Bilirubin, Direct: 0.1 mg/dL (ref 0.0–0.3)
Total Bilirubin: 0.8 mg/dL (ref 0.2–1.2)
Total Protein: 8.2 g/dL (ref 6.0–8.3)

## 2013-12-24 LAB — IBC PANEL
Iron: 59 ug/dL (ref 42–145)
Saturation Ratios: 14.2 % — ABNORMAL LOW (ref 20.0–50.0)
Transferrin: 295.8 mg/dL (ref 212.0–360.0)

## 2013-12-24 LAB — LIPID PANEL
Cholesterol: 185 mg/dL (ref 0–200)
HDL: 64.7 mg/dL (ref >39.00)
LDL Cholesterol: 102 mg/dL — ABNORMAL HIGH (ref 0–99)
NonHDL: 120.3
Total CHOL/HDL Ratio: 3
Triglycerides: 93 mg/dL (ref 0.0–149.0)
VLDL: 18.6 mg/dL (ref 0.0–40.0)

## 2013-12-24 LAB — TSH: TSH: 1.87 u[IU]/mL (ref 0.35–4.50)

## 2013-12-24 LAB — VITAMIN B12: Vitamin B-12: 404 pg/mL (ref 211–911)

## 2013-12-24 LAB — HEMOGLOBIN A1C: Hgb A1c MFr Bld: 6.1 % (ref 4.6–6.5)

## 2013-12-24 MED ORDER — OLMESARTAN MEDOXOMIL 5 MG PO TABS: 5.0000 mg | ORAL_TABLET | Freq: Every day | ORAL | Status: DC

## 2013-12-24 NOTE — Progress Notes (Signed)
   Subjective:    Patient ID: [name], female    DOB: [birth year], 58 y.o.   MRN: [id number]  HPI  HPI HYPERTENSION  Blood pressure range-good  Chest pain- no      Dyspnea- no Lightheadedness- no   Edema- no Other side effects - no   Medication compliance: good Low salt diet- yes  DIABETES  Blood Sugar ranges-under 100  Polyuria- no New Visual problems- no Hypoglycemic symptoms- no Other side effects-no Medication compliance - good Last eye exam- last year Foot exam- today-- sees podiatry q19m  HYPERLIPIDEMIA  Medication compliance- good RUQ pain- no  Muscle aches- no Other side effects-no   Review of Systems    as above Objective:   Physical Exam  BP 115/72  Pulse 67  Temp(Src) 98.4 F (36.9 C) (Oral)  Wt 194 lb 3.2 oz (88.089 kg)  SpO2 97% General appearance: alert, cooperative, appears stated age and no distress Lungs: clear to auscultation bilaterally Heart: regular rate and rhythm, S1, S2 normal, no murmur, click, rub or gallop Extremities: extremities normal, atraumatic, no cyanosis or edema Sensory exam of the foot is normal, tested with the monofilament. Good pulses, no lesions or ulcers, good peripheral pulses.        Assessment & Plan:  1. Postoperative malabsorption Check labs - Basic metabolic panel - CBC with Differential - Hemoglobin A1c - Hepatic function panel - Lipid panel - POCT urinalysis dipstick - Vitamin B12 - Vitamin D 1,25 dihydroxy - TSH - IBC panel - Ferritin; Future - DG Bone Density; Future  2. Type II or unspecified type diabetes mellitus with peripheral circulatory disorders, uncontrolled(250.72) con't meds and check labs - Basic metabolic panel - Hemoglobin A1c Dec benicar 5mg   3. Other and unspecified hyperlipidemia Check labs, con't meds  - Hepatic function panel - Lipid panel  4. Estrogen deficiency   - DG Bone Density; Future

## 2013-12-24 NOTE — Patient Instructions (Signed)

## 2013-12-24 NOTE — Progress Notes (Signed)
Pre visit review using our clinic review tool, if applicable. No additional management support is needed unless otherwise documented below in the visit note. 

## 2013-12-27 LAB — VITAMIN D 1,25 DIHYDROXY
Vitamin D 1, 25 (OH)2 Total: 81 pg/mL — ABNORMAL HIGH (ref 18–72)
Vitamin D2 1, 25 (OH)2: <8 pg/mL
Vitamin D3 1, 25 (OH)2: 81 pg/mL

## 2014-01-01 ENCOUNTER — Ambulatory Visit (HOSPITAL_COMMUNITY): Payer: 59

## 2014-01-03 ENCOUNTER — Ambulatory Visit (HOSPITAL_COMMUNITY)
Admission: RE | Admit: 2014-01-03 | Discharge: 2014-01-03 | Disposition: A | Discharge status: Home / Self Care | Payer: 59 | Source: Ambulatory Visit | Attending: Family Medicine | Admitting: Family Medicine

## 2014-01-03 DIAGNOSIS — Z13820 Encounter for screening for osteoporosis: Secondary | ICD-10-CM | POA: Insufficient documentation

## 2014-01-03 DIAGNOSIS — K912 Postsurgical malabsorption, not elsewhere classified: Secondary | ICD-10-CM | POA: Insufficient documentation

## 2014-01-03 DIAGNOSIS — E2839 Other primary ovarian failure: Secondary | ICD-10-CM | POA: Insufficient documentation

## 2014-01-04 ENCOUNTER — Ambulatory Visit (INDEPENDENT_AMBULATORY_CARE_PROVIDER_SITE_OTHER): Payer: 59 | Admitting: Podiatry

## 2014-01-04 ENCOUNTER — Ambulatory Visit (HOSPITAL_COMMUNITY): Payer: 59

## 2014-01-04 ENCOUNTER — Encounter: Payer: Self-pay | Admitting: Podiatry

## 2014-01-04 VITALS — BP 126/71 | HR 54

## 2014-01-04 DIAGNOSIS — Q828 Other specified congenital malformations of skin: Secondary | ICD-10-CM

## 2014-01-04 DIAGNOSIS — M79604 Pain in right leg: Secondary | ICD-10-CM

## 2014-01-04 DIAGNOSIS — L988 Other specified disorders of the skin and subcutaneous tissue: Secondary | ICD-10-CM

## 2014-01-04 DIAGNOSIS — R234 Changes in skin texture: Secondary | ICD-10-CM

## 2014-01-04 DIAGNOSIS — M79609 Pain in unspecified limb: Secondary | ICD-10-CM

## 2014-01-04 NOTE — Progress Notes (Signed)
Follow up on right 2nd digit distal end fissured area.  Objective: Still has horizontal raw base (~1.2cm long) between hard build up callus just distal to the deformed toe nail. No sign of infection or drainage.   Assessment: Slow improving fissured lesion 2nd digit right with hyperkeratosis.  Plan: Area debrided and buttress pad placed with extra pad to replace.

## 2014-01-04 NOTE — Patient Instructions (Signed)
Seen for fissured 2nd digit right.  Callused skin debrided and padded. Return in 2 weeks.

## 2014-01-17 ENCOUNTER — Ambulatory Visit: Payer: 59 | Admitting: Dietician

## 2014-01-22 ENCOUNTER — Encounter: Payer: Self-pay | Admitting: Podiatry

## 2014-01-22 ENCOUNTER — Ambulatory Visit (INDEPENDENT_AMBULATORY_CARE_PROVIDER_SITE_OTHER): Payer: 59 | Admitting: Podiatry

## 2014-01-22 VITALS — BP 131/67 | HR 68

## 2014-01-22 DIAGNOSIS — M79609 Pain in unspecified limb: Secondary | ICD-10-CM

## 2014-01-22 DIAGNOSIS — L600 Ingrowing nail: Secondary | ICD-10-CM

## 2014-01-22 DIAGNOSIS — Q828 Other specified congenital malformations of skin: Secondary | ICD-10-CM

## 2014-01-22 DIAGNOSIS — R234 Changes in skin texture: Secondary | ICD-10-CM

## 2014-01-22 DIAGNOSIS — L988 Other specified disorders of the skin and subcutaneous tissue: Secondary | ICD-10-CM

## 2014-01-22 DIAGNOSIS — M79604 Pain in right leg: Secondary | ICD-10-CM

## 2014-01-22 NOTE — Patient Instructions (Signed)
Follow up on 2nd toe lesion right foot. Debrided excess callus and nails. Continue with current level of care. Return in 3 weeks.

## 2014-01-22 NOTE — Progress Notes (Signed)
Subjective: Follow up on right 2nd digit distal end fissured area. Also having pain from ingrown nail on right great toe.  Objective:  Thick ingrown nail on right great toe, symptomatic without open skin or inflammation. Hypertrophic nails x 10.  Healing wound with horizontal raw base (~1.2cm long) between hard build up callus just distal to the deformed toe nail. Less deep at fissured site.  No sign of infection or drainage.   Assessment: Slow improving fissured lesion 2nd digit right with hyperkeratosis.  Painful ingrown nail right hallux.   Plan:  Area debrided and buttress pad placed with extra pad to replace. All nails debrided. Pain relieved with debridement.  Return in 3 weeks.

## 2014-01-23 ENCOUNTER — Encounter: Payer: 59 | Attending: Surgery | Admitting: Dietician

## 2014-01-23 DIAGNOSIS — Z6827 Body mass index (BMI) 27.0-27.9, adult: Secondary | ICD-10-CM | POA: Diagnosis not present

## 2014-01-23 DIAGNOSIS — Z713 Dietary counseling and surveillance: Secondary | ICD-10-CM | POA: Diagnosis not present

## 2014-01-23 DIAGNOSIS — Z9884 Bariatric surgery status: Secondary | ICD-10-CM | POA: Insufficient documentation

## 2014-01-23 NOTE — Patient Instructions (Signed)
Goals:  Follow Phase 3B: High Protein + Non-Starchy Vegetables  Eat 3-6 small meals/snacks, every 3-5 hrs  Increase lean protein foods to meet 60g goal  Increase fluid intake to 64oz +  Avoid drinking 15 minutes before, during and 30 minutes after eating  Aim for >30 min of physical activity daily  If your blood sugar keeps going low, add about 15 g of carbohydrate along with protein in the middle of day

## 2014-01-23 NOTE — Progress Notes (Signed)
  Follow-up visit:  6 Months Post-Operative Gastric Sleeve Surgery  Medical Nutrition Therapy:  Appt start time: 0730 end time:  0800.  Primary concerns today: Post-operative Bariatric Surgery Nutrition Management. [name] returns today with a 2.5 lb (5.5 lb fat) loss. Tolerating all foods.   Has had 2 low blood sugar episodes since last visit. Has some glucose tablets available.   Surgery date: 07/03/12 Surgery type: LAGB Revision Start weight at [facility]: 262.6 lbs  Surgery date: 07/02/2013 Surgery type: Gastric Sleeve  Weight today: 199.0 lbs  Weight change: 2.5 lbs  Total weight lost:  63.6 lbs  Weight goal: 180 lbs % goal met: 55%  TANITA  BODY COMP RESULTS  09/15/11 01/31/12 05/08/12 08/30/12 11/06/12 01/04/13 01/24/13 03/23/13 06/21/13 07/17/13 09/03/13 10/18/13 01/23/14   BMI (kg/m^2) 30.3 31.4 32.6 31.0 32.1 31.5 31.9 31.7 32.8 30.3 29.3 28.1 27.8   FM (lbs) 100.5 105.5 111.0 105.0 110.5 106.5 109.0 112.0 116.5 104.5 95.5 89.5 84.0   FFM (lbs) 116.0 120.0 123.0 117.5 119.5 119.5 120.0 115.0 118.5 113.0 114.5 112.0 115.0   TBW (lbs) 85.0 88.0 90.0 86.0 87.5 87.5 88.0 84.0 87.0 82.5 84.0 82.0 84.0    Preferred Learning Style:   No preference indicated   Learning Readiness:   Ready  24-hr recall: B (AM): Protein Shake (atkins)  Or egg with cheese (12-15 g) Snk (AM): Dannon Light and Fit (12 g)  L (PM): 3 oz grilled chicken and salad and green beans or squash or ground beef and cheese  (21 g) Snk (PM): [place] and cheese (14 g) D PM): 3 oz chicken or flounder with collards (21 g) Snk (PM): none  Fluid intake:64 oz mostly water or crystal light Estimated total protein intake: 70-83 g   Medications: no longer taking Victoza, blood pressure medication "cut in half" Supplementation: taking  CBG monitoring: 3 x day  Average CBG per patient: 80-95 mg/dl fasting Last patient reported A1c: 6.1% in June 2015  Using straws: No Drinking while eating: No Hair loss: No Carbonated  beverages: No N/V/D/C: Ate one bite too much one time Dumping syndrome: No  Recent physical activity:  Walking 5600-7500 steps per day, treadmill at home, spin class 1-3 x week   Progress Towards Goal(s):  In progress.  Nutritional Diagnosis:  -3.3 Overweight/obesity related to past poor dietary habits and physical inactivity as evidenced by patient w/ recent Sleeve Gastrectomy surgery following dietary guidelines for continued weight loss.    Intervention:  Nutrition education/diet reinforcment.  Teaching Method Utilized:  Visual Auditory Hands on  Barriers to learning/adherence to lifestyle change: none  Demonstrated degree of understanding via:  Teach Back   Monitoring/Evaluation:  Dietary intake, exercise, and body weight. Follow up in 3 months for 9 month post-op visit.

## 2014-02-12 ENCOUNTER — Encounter: Payer: Self-pay | Admitting: Podiatry

## 2014-02-12 ENCOUNTER — Ambulatory Visit (INDEPENDENT_AMBULATORY_CARE_PROVIDER_SITE_OTHER): Payer: 59 | Admitting: Podiatry

## 2014-02-12 VITALS — BP 122/59 | HR 70

## 2014-02-12 DIAGNOSIS — Q828 Other specified congenital malformations of skin: Secondary | ICD-10-CM

## 2014-02-12 DIAGNOSIS — R234 Changes in skin texture: Secondary | ICD-10-CM

## 2014-02-12 DIAGNOSIS — L988 Other specified disorders of the skin and subcutaneous tissue: Secondary | ICD-10-CM

## 2014-02-12 NOTE — Patient Instructions (Signed)
Follow up on fissured 2nd digit right. Skin healed off complete at the affected area. May return in 3 months for routine foot work on ingrown nail and recurrent callus.

## 2014-02-12 NOTE — Progress Notes (Signed)
Subjective:  Follow up on right 2nd digit distal end fissured area. Been using buttress pad that was dispensed during last visit.   Objective:  Healing wound with minimum crack and callus. No sign of infection or drainage.   Assessment: Improved fissured lesion 2nd digit right with keratotic skin over the fissure.  Plan:  Area debrided. No need for another padding.  Will see as needed or in 3 months for ingrown nail and callus.

## 2014-02-28 ENCOUNTER — Ambulatory Visit (INDEPENDENT_AMBULATORY_CARE_PROVIDER_SITE_OTHER): Payer: Self-pay | Admitting: Family Medicine

## 2014-02-28 VITALS — BP 139/76 | HR 61 | Wt 193.4 lb

## 2014-02-28 DIAGNOSIS — E119 Type 2 diabetes mellitus without complications: Secondary | ICD-10-CM

## 2014-02-28 NOTE — Progress Notes (Signed)
Patient presents today for DM follow-up as part of employer-sponsored Link to [employer]. Medications, glucose readings, a1c and compliance have been reviewed. I have also discussed with patient lifestyle interventions including diet and exercise. Details of the visit can be found in [employer] documenting program through [facility] [place]). Patient has set a series of personal goals and will follow-up in no more than 3 months for further review of DM.   Continues weight loss. Goals for next visit centered around continuing great diet and improving exercise. Today's A1c: 5.7%

## 2014-03-02 ENCOUNTER — Encounter: Payer: Self-pay | Admitting: Internal Medicine

## 2014-03-28 ENCOUNTER — Encounter: Payer: Self-pay | Admitting: Family Medicine

## 2014-03-28 NOTE — Progress Notes (Signed)
Patient ID: [name], female   DOB: 1956-02-06, 58 y.o.   MRN: [id number] Reviewed: Agree with the documentation and management of our [facility] pharmacologist.

## 2014-04-01 ENCOUNTER — Encounter: Payer: Self-pay | Admitting: Family Medicine

## 2014-04-01 ENCOUNTER — Ambulatory Visit (INDEPENDENT_AMBULATORY_CARE_PROVIDER_SITE_OTHER): Payer: 59 | Admitting: Family Medicine

## 2014-04-01 VITALS — BP 124/62 | HR 62 | Temp 98.2°F | Wt 192.7 lb

## 2014-04-01 DIAGNOSIS — E1159 Type 2 diabetes mellitus with other circulatory complications: Secondary | ICD-10-CM

## 2014-04-01 DIAGNOSIS — E785 Hyperlipidemia, unspecified: Secondary | ICD-10-CM

## 2014-04-01 DIAGNOSIS — I10 Essential (primary) hypertension: Secondary | ICD-10-CM

## 2014-04-01 LAB — POCT URINALYSIS DIPSTICK
Bilirubin, UA: NEGATIVE
Blood, UA: NEGATIVE
Glucose, UA: NEGATIVE
Ketones, UA: NEGATIVE
Leukocytes, UA: NEGATIVE
Nitrite, UA: NEGATIVE
Protein, UA: NEGATIVE
Spec Grav, UA: 1.015
Urobilinogen, UA: 0.2
pH, UA: 6.5

## 2014-04-01 LAB — BASIC METABOLIC PANEL
BUN: 13 mg/dL (ref 6–23)
CO2: 31 mEq/L (ref 19–32)
Calcium: 10.1 mg/dL (ref 8.4–10.5)
Chloride: 101 mEq/L (ref 96–112)
Creatinine, Ser: 0.9 mg/dL (ref 0.4–1.2)
GFR: 66.82 mL/min (ref >60.00)
Glucose, Bld: 95 mg/dL (ref 70–99)
Potassium: 4.4 mEq/L (ref 3.5–5.1)
Sodium: 140 mEq/L (ref 135–145)

## 2014-04-01 LAB — LIPID PANEL
Cholesterol: 188 mg/dL (ref 0–200)
HDL: 59.6 mg/dL (ref >39.00)
LDL Cholesterol: 112 mg/dL — ABNORMAL HIGH (ref 0–99)
NonHDL: 128.4
Total CHOL/HDL Ratio: 3
Triglycerides: 81 mg/dL (ref 0.0–149.0)
VLDL: 16.2 mg/dL (ref 0.0–40.0)

## 2014-04-01 LAB — HEPATIC FUNCTION PANEL
ALT: 16 U/L (ref 0–35)
AST: 17 U/L (ref 0–37)
Albumin: 4.4 g/dL (ref 3.5–5.2)
Alkaline Phosphatase: 85 U/L (ref 39–117)
Bilirubin, Direct: 0 mg/dL (ref 0.0–0.3)
Total Bilirubin: 0.7 mg/dL (ref 0.2–1.2)
Total Protein: 8.6 g/dL — ABNORMAL HIGH (ref 6.0–8.3)

## 2014-04-01 LAB — HEMOGLOBIN A1C: Hgb A1c MFr Bld: 6.1 % (ref 4.6–6.5)

## 2014-04-01 NOTE — Progress Notes (Signed)
Subjective:    Patient ID: [name], female    DOB: 1955/12/20, 58 y.o.   MRN: [id number]  HPI  HPI HYPERTENSION  Blood pressure range-normal   Chest pain- no      Dyspnea- no Lightheadedness- no   Edema- no Other side effects - no   Medication compliance: good Low salt diet- yes  DIABETES  Blood Sugar ranges-77-112  Polyuria- no New Visual problems- no Hypoglycemic symptoms- no Other side effects-no Medication compliance - good Last eye exam- 01/2014 Foot exam- today  HYPERLIPIDEMIA  Medication compliance- good RUQ pain- no  Muscle aches- no Other side effects-no     Review of Systems As above  Past Medical History  Diagnosis Date  . Allergic rhinitis   . Menopause   . GERD (gastroesophageal reflux disease)   . IBS (irritable bowel syndrome)   . Anxiety   . Depression   . Hypertension   . Hyperlipemia   . Asthma     hx bronchial asthma at times with upper resp infection  . Headache(784.0)     migraine and cluster headaches  . Stress incontinence     at times  . Neuromuscular disorder 2009    hx of fibromyalgia, polyarthralsia (surgery induced)   History   Social History  . Marital Status: Married    Spouse Name: N/A    Number of Children: N/A  . Years of Education: N/A   Occupational History  . RN [employer]   Social History Main Topics  . Smoking status: Never Smoker   . Smokeless tobacco: Never Used  . Alcohol Use: No  . Drug Use: No  . Sexual Activity: Yes    Birth Control/ Protection: None   Other Topics Concern  . Not on file   Social History Narrative  . No narrative on file   Family History  Problem Relation Age of Onset  . Diabetes Mother   . Stroke Mother   . Breast cancer Mother   . Atrial fibrillation Mother   . Hypertension Mother   . Cancer Mother     breast  . Diabetes Father   . Stroke Father   . Hypertension Father   . Heart attack Father   . Thyroid disease    . Heart disease      Current Outpatient Prescriptions  Medication Sig Dispense Refill  . Ca Phosphate-Cholecalciferol (CALTRATE GUMMY BITES PO) Take 2 tablets by mouth at bedtime.      . citalopram (CELEXA) 20 MG tablet Take 1 tablet (20 mg total) by mouth every evening.  90 tablet  3  . estradiol (VIVELLE-DOT) 0.1 MG/24HR Place 1 patch (0.1 mg total) onto the skin 2 (two) times a week. Monday and Thursday  8 patch  11  . furosemide (LASIX) 20 MG tablet Take 1 tablet (20 mg total) by mouth every morning.  90 tablet  3  . glucose blood test strip Use as instructed  100 each  12  . loratadine (CLARITIN) 10 MG tablet Take 10 mg by mouth every evening.       . olmesartan (BENICAR) 5 MG tablet Take 1 tablet (5 mg total) by mouth daily.  90 tablet  3  . Pediatric Multivit-Minerals-C (ONE-A-DAY SCOOBY-DOO GUMMIES PO) Take 1 tablet by mouth daily.      . rosuvastatin (CRESTOR) 20 MG tablet Take 1 tablet (20 mg total) by mouth at bedtime.  90 tablet  3  . vitamin B-12 (CYANOCOBALAMIN) 100  MCG tablet Take 100 mcg by mouth daily.       No current facility-administered medications for this visit.       Objective:   Physical Exam BP 124/62  Pulse 62  Temp(Src) 98.2 F (36.8 C) (Oral)  Wt 192 lb 10.9 oz (87.4 kg)  SpO2 97% General appearance: alert, cooperative and no distress Neck: no adenopathy, supple, symmetrical, trachea midline and thyroid not enlarged, symmetric, no tenderness/mass/nodules Lungs: clear to auscultation bilaterally Heart: S1, S2 normal Extremities: extremities normal, atraumatic, no cyanosis or edema        Assessment & Plan:  1. Type II or unspecified type diabetes mellitus with peripheral circulatory disorders, uncontrolled(250.72) Check labs, con't meds - Hemoglobin A1c - Hepatic function panel  2. Other and unspecified hyperlipidemia Check labs - Hepatic function panel - Lipid panel - POCT urinalysis dipstick  3. Essential hypertension stable - Basic metabolic panel -  POCT urinalysis dipstick

## 2014-04-01 NOTE — Patient Instructions (Signed)
Diabetes and Standards of Medical Care Diabetes is complicated. You may find that your diabetes team includes a dietitian, nurse, diabetes educator, eye doctor, and more. To help everyone know what is going on and to help you get the care you deserve, the following schedule of care was developed to help keep you on track. Below are the tests, exams, vaccines, medicines, education, and plans you will need. HbA1c test This test shows how well you have controlled your glucose over the past 2-3 months. It is used to see if your diabetes management plan needs to be adjusted.   It is performed at least 2 times a year if you are meeting treatment goals.  It is performed 4 times a year if therapy has changed or if you are not meeting treatment goals. Blood pressure test  This test is performed at every routine medical visit. The goal is less than 140/90 mm Hg for most people, but 130/80 mm Hg in some cases. Ask your health care provider about your goal. Dental exam  Follow up with the dentist regularly. Eye exam  If you are diagnosed with type 1 diabetes as a child, get an exam upon reaching the age of 37 years or older and have had diabetes for 3-5 years. Yearly eye exams are recommended after that initial eye exam.  If you are diagnosed with type 1 diabetes as an adult, get an exam within 5 years of diagnosis and then yearly.  If you are diagnosed with type 2 diabetes, get an exam as soon as possible after the diagnosis and then yearly. Foot care exam  Visual foot exams are performed at every routine medical visit. The exams check for cuts, injuries, or other problems with the feet.  A comprehensive foot exam should be done yearly. This includes visual inspection as well as assessing foot pulses and testing for loss of sensation.  Check your feet nightly for cuts, injuries, or other problems with your feet. Tell your health care provider if anything is not healing. Kidney function test (urine  microalbumin)  This test is performed once a year.  Type 1 diabetes: The first test is performed 5 years after diagnosis.  Type 2 diabetes: The first test is performed at the time of diagnosis.  A serum creatinine and estimated glomerular filtration rate (eGFR) test is done once a year to assess the level of chronic kidney disease (CKD), if present. Lipid profile (cholesterol, HDL, LDL, triglycerides)  Performed every 5 years for most people.  The goal for LDL is less than 100 mg/dL. If you are at high risk, the goal is less than 70 mg/dL.  The goal for HDL is 40 mg/dL-50 mg/dL for men and 50 mg/dL-60 mg/dL for women. An HDL cholesterol of 60 mg/dL or higher gives some protection against heart disease.  The goal for triglycerides is less than 150 mg/dL. Influenza vaccine, pneumococcal vaccine, and hepatitis B vaccine  The influenza vaccine is recommended yearly.  It is recommended that people with diabetes who are over 24 years old get the pneumonia vaccine. In some cases, two separate shots may be given. Ask your health care provider if your pneumonia vaccination is up to date.  The hepatitis B vaccine is also recommended for adults with diabetes. Diabetes self-management education  Education is recommended at diagnosis and ongoing as needed. Treatment plan  Your treatment plan is reviewed at every medical visit. Document Released: 04/25/2009 Document Revised: 11/12/2013 Document Reviewed: 11/28/2012 [facility] Patient Information 2015 [place],  [employer]. This information is not intended to replace advice given to you by your health care provider. Make sure you discuss any questions you have with your health care provider.  

## 2014-04-01 NOTE — Progress Notes (Signed)
Pre visit review using our clinic review tool, if applicable. No additional management support is needed unless otherwise documented below in the visit note. 

## 2014-04-29 ENCOUNTER — Encounter: Payer: 59 | Attending: Surgery | Admitting: Dietician

## 2014-04-29 DIAGNOSIS — Z9884 Bariatric surgery status: Secondary | ICD-10-CM | POA: Insufficient documentation

## 2014-04-29 DIAGNOSIS — Z713 Dietary counseling and surveillance: Secondary | ICD-10-CM | POA: Insufficient documentation

## 2014-04-29 DIAGNOSIS — Z6826 Body mass index (BMI) 26.0-26.9, adult: Secondary | ICD-10-CM | POA: Diagnosis not present

## 2014-04-29 NOTE — Progress Notes (Addendum)
Follow-up visit:  6 Months Post-Operative Gastric Sleeve Surgery  Medical Nutrition Therapy:  Appt start time: 0730 end time:  0800.  Primary concerns today: Post-operative Bariatric Surgery Nutrition Management. [name] returns today with a 8.5 lb weight loss. Tolerating all foods. Having some diarrhea when having protein shakes and decaf coffee.   Has had 1 low blood sugar episodes since last visit (66 mg/d). Has some glucose tablets available.   Has not been able to exercise lately d/t work schedule. May have a hernia that they are monitoring.   Surgery date: 07/03/12 Surgery type: LAGB Revision Start weight at [facility]: 262.6 lbs  Surgery date: 07/02/2013 Surgery type: Gastric Sleeve Weight today: 190.5 lbs  Weight change: 8.5 lbs  Total weight lost:  72.1 lbs  Weight goal: 180 lbs % goal met: 87%  TANITA  BODY COMP RESULTS  09/15/11 01/31/12 05/08/12 08/30/12 11/06/12 01/04/13 01/24/13 03/23/13 06/21/13 07/17/13 09/03/13 10/18/13 01/23/14 04/29/14   BMI (kg/m^2) 30.3 31.4 32.6 31.0 32.1 31.5 31.9 31.7 32.8 30.3 29.3 28.1 27.8 26.6   FM (lbs) 100.5 105.5 111.0 105.0 110.5 106.5 109.0 112.0 116.5 104.5 95.5 89.5 84.0 83.5   FFM (lbs) 116.0 120.0 123.0 117.5 119.5 119.5 120.0 115.0 118.5 113.0 114.5 112.0 115.0 107.0   TBW (lbs) 85.0 88.0 90.0 86.0 87.5 87.5 88.0 84.0 87.0 82.5 84.0 82.0 84.0 78.5    Preferred Learning Style:   No preference indicated   Learning Readiness:   Ready  24-hr recall: B (AM): Protein Shake (atkins)  Or egg with cheese (12-15 g) Snk (AM): Dannon Light and Fit (12 g)  L (PM): 3 oz grilled chicken and salad and green beans or squash or ground beef and cheese  (21 g) Snk (PM): [place] and cheese sometimes has a starch (14 g) D PM): 3 oz chicken or flounder with collards (21 g) Snk (PM): none  Fluid intake:64 oz mostly water or crystal light Estimated total protein intake: 70-83 g   Medications: no longer taking Victoza, blood pressure medication "cut in  half" Supplementation: taking  CBG monitoring: 2-3 x day  Average CBG per patient: 80-90 mg/dl fasting Last patient reported A1c: 5.7% in September (per patient)  Using straws: No Drinking while eating: No Hair loss: No Carbonated beverages: No N/V/D/C: Has some some loose stools after protein shakes. Dumping syndrome: No  Recent physical activity:  Not able to exercise lately d/t schedule  Progress Towards Goal(s):  In progress.  Nutritional Diagnosis:  [place]-3.3 Overweight/obesity related to past poor dietary habits and physical inactivity as evidenced by patient w/ recent Sleeve Gastrectomy surgery following dietary guidelines for continued weight loss.    Intervention:  Nutrition education/diet reinforcment. Plan to continue with Phase 3B High Protein + Non-Starchy Vegetable diet and increase physical activity when work schedule permits.   Goals:  Follow Phase 3B: High Protein + Non-Starchy Vegetables  Eat 3-6 small meals/snacks, every 3-5 hrs  Increase lean protein foods to meet 60g goal  Increase fluid intake to 64oz +  Avoid drinking 15 minutes before, during and 30 minutes after eating  Aim for >30 min of physical activity daily  If your blood sugar keeps going low, add about 15 g of carbohydrate along with protein in the middle of day   Teaching Method Utilized:  Visual Auditory Hands on  Barriers to learning/adherence to lifestyle change: none  Demonstrated degree of understanding via:  Teach Back   Monitoring/Evaluation:  Dietary intake, exercise, and body weight. Follow up in 3 months  for 13 month post-op visit.

## 2014-04-29 NOTE — Patient Instructions (Addendum)
Goals:  Follow Phase 3B: High Protein + Non-Starchy Vegetables  Eat 3-6 small meals/snacks, every 3-5 hrs  Increase lean protein foods to meet 60g goal  Increase fluid intake to 64oz +  Avoid drinking 15 minutes before, during and 30 minutes after eating  Aim for >30 min of physical activity daily  If your blood sugar keeps going low, add about 15 g of carbohydrate along with protein in the middle of day  Surgery date: 07/02/2013 Surgery type: Gastric Sleeve Weight today: 190.5 lbs Weight change: 8.5 lbs  Total weight lost:  72.1 lbs  Weight goal: 180 lbs % goal met: 87%  TANITA  BODY COMP RESULTS  09/15/11 01/31/12 05/08/12 08/30/12 11/06/12 01/04/13 01/24/13 03/23/13 06/21/13 07/17/13 09/03/13 10/18/13 01/23/14 04/29/14   BMI (kg/m^2) 30.3 31.4 32.6 31.0 32.1 31.5 31.9 31.7 32.8 30.3 29.3 28.1 27.8 26.6   FM (lbs) 100.5 105.5 111.0 105.0 110.5 106.5 109.0 112.0 116.5 104.5 95.5 89.5 84.0 83.5   FFM (lbs) 116.0 120.0 123.0 117.5 119.5 119.5 120.0 115.0 118.5 113.0 114.5 112.0 115.0 107.0   TBW (lbs) 85.0 88.0 90.0 86.0 87.5 87.5 88.0 84.0 87.0 82.5 84.0 82.0 84.0 78.5

## 2014-05-08 ENCOUNTER — Other Ambulatory Visit (HOSPITAL_BASED_OUTPATIENT_CLINIC_OR_DEPARTMENT_OTHER): Payer: Self-pay | Admitting: Obstetrics and Gynecology

## 2014-05-08 DIAGNOSIS — Z1231 Encounter for screening mammogram for malignant neoplasm of breast: Secondary | ICD-10-CM

## 2014-05-13 ENCOUNTER — Encounter: Payer: Self-pay | Admitting: Family Medicine

## 2014-05-15 ENCOUNTER — Ambulatory Visit: Payer: 59 | Admitting: Podiatry

## 2014-05-21 ENCOUNTER — Ambulatory Visit (INDEPENDENT_AMBULATORY_CARE_PROVIDER_SITE_OTHER): Payer: 59 | Admitting: Podiatry

## 2014-05-21 ENCOUNTER — Encounter: Payer: Self-pay | Admitting: Podiatry

## 2014-05-21 VITALS — BP 133/73 | HR 65

## 2014-05-21 DIAGNOSIS — B351 Tinea unguium: Secondary | ICD-10-CM

## 2014-05-21 DIAGNOSIS — M79606 Pain in leg, unspecified: Secondary | ICD-10-CM

## 2014-05-21 DIAGNOSIS — L600 Ingrowing nail: Secondary | ICD-10-CM

## 2014-05-21 NOTE — Patient Instructions (Signed)
Seen for deformed and long nails. All debrided. May benefit from Flexor tenotomy on 2nd right to prevent further breakdown of skin.

## 2014-05-21 NOTE — Progress Notes (Signed)
Subjective:  58 year old female presents requesting toe nails trimmed. 2nd digit distal end fissured area has healed and covered with callus. Ingrown nails are painful.   Objective:  Thick dystrophic nails x 10. Ingrown nail on both great toes with callus build up at distal medial. No edema or erythema, no redness noted.  Contracted digit 2nd right with distal callus and has history of skin break down.  Assessment: Onychomycosis x 10. Ingrown nail both great toe without infection.  Contracted digit 2nd right. Pain in lower limbs.   Plan:  Reviewed findings. All nails debrided. May benefit from Flexor tenotomy of right 2nd digit.

## 2014-06-07 ENCOUNTER — Ambulatory Visit (HOSPITAL_BASED_OUTPATIENT_CLINIC_OR_DEPARTMENT_OTHER)
Admission: RE | Admit: 2014-06-07 | Discharge: 2014-06-07 | Disposition: A | Discharge status: Home / Self Care | Payer: 59 | Source: Ambulatory Visit | Attending: Obstetrics and Gynecology | Admitting: Obstetrics and Gynecology

## 2014-06-07 DIAGNOSIS — Z1211 Encounter for screening for malignant neoplasm of colon: Secondary | ICD-10-CM | POA: Insufficient documentation

## 2014-06-07 DIAGNOSIS — Z1231 Encounter for screening mammogram for malignant neoplasm of breast: Secondary | ICD-10-CM

## 2014-07-01 ENCOUNTER — Ambulatory Visit: Payer: 59 | Admitting: Family Medicine

## 2014-07-16 ENCOUNTER — Ambulatory Visit (INDEPENDENT_AMBULATORY_CARE_PROVIDER_SITE_OTHER): Payer: 59 | Admitting: Family Medicine

## 2014-07-16 ENCOUNTER — Encounter: Payer: Self-pay | Admitting: Family Medicine

## 2014-07-16 VITALS — BP 136/78 | HR 68 | Temp 97.0°F | Resp 18 | Wt 194.4 lb

## 2014-07-16 DIAGNOSIS — F32A Depression, unspecified: Secondary | ICD-10-CM

## 2014-07-16 DIAGNOSIS — F329 Major depressive disorder, single episode, unspecified: Secondary | ICD-10-CM

## 2014-07-16 DIAGNOSIS — M545 Low back pain, unspecified: Secondary | ICD-10-CM

## 2014-07-16 DIAGNOSIS — E785 Hyperlipidemia, unspecified: Secondary | ICD-10-CM

## 2014-07-16 DIAGNOSIS — I10 Essential (primary) hypertension: Secondary | ICD-10-CM

## 2014-07-16 DIAGNOSIS — E119 Type 2 diabetes mellitus without complications: Secondary | ICD-10-CM

## 2014-07-16 MED ORDER — GLUCOSE BLOOD VI STRP: ORAL_STRIP | Status: DC

## 2014-07-16 MED ORDER — ONETOUCH DELICA LANCETS FINE MISC: Status: DC

## 2014-07-16 MED ORDER — CYCLOBENZAPRINE HCL 10 MG PO TABS: 10.0000 mg | ORAL_TABLET | Freq: Three times a day (TID) | ORAL | Status: DC | PRN

## 2014-07-16 MED ORDER — CITALOPRAM HYDROBROMIDE 20 MG PO TABS: 20.0000 mg | ORAL_TABLET | Freq: Every evening | ORAL | Status: DC

## 2014-07-16 NOTE — Progress Notes (Signed)
Subjective:    Patient ID: [name], female    DOB: [birth year], 59 y.o.   MRN: [id number]  HPI  HPI HYPERTENSION  Blood pressure range-good  Chest pain- no      Dyspnea- no Lightheadedness- no   Edema- no Other side effects - no   Medication compliance: good Low salt diet- yes  DIABETES  Blood Sugar ranges-good  Polyuria- no New Visual problems- no Hypoglycemic symptoms- no Other side effects-no Medication compliance - diet controlled Last eye exam- last month Foot exam- today  HYPERLIPIDEMIA  Medication compliance- good RUQ pain- no  Muscle aches- no Other side effects-no  Past Medical History  Diagnosis Date  . Allergic rhinitis   . Menopause   . GERD (gastroesophageal reflux disease)   . IBS (irritable bowel syndrome)   . Anxiety   . Depression   . Hypertension   . Hyperlipemia   . Asthma     hx bronchial asthma at times with upper resp infection  . Headache(784.0)     migraine and cluster headaches  . Stress incontinence     at times  . Neuromuscular disorder 2009    hx of fibromyalgia, polyarthralsia (surgery induced)   History   Social History  . Marital Status: Married    Spouse Name: N/A    Number of Children: N/A  . Years of Education: N/A   Occupational History  . RN [employer]   Social History Main Topics  . Smoking status: Never Smoker   . Smokeless tobacco: Never Used  . Alcohol Use: No  . Drug Use: No  . Sexual Activity: Yes    Birth Control/ Protection: None   Other Topics Concern  . Not on file   Social History Narrative   Family History  Problem Relation Age of Onset  . Diabetes Mother   . Stroke Mother   . Breast cancer Mother   . Atrial fibrillation Mother   . Hypertension Mother   . Cancer Mother     breast  . Diabetes Father   . Stroke Father   . Hypertension Father   . Heart attack Father   . Thyroid disease    . Heart disease     Current Outpatient Prescriptions  Medication Sig  Dispense Refill  . Ca Phosphate-Cholecalciferol (CALTRATE GUMMY BITES PO) Take 2 tablets by mouth at bedtime.    . citalopram (CELEXA) 20 MG tablet Take 1 tablet (20 mg total) by mouth every evening. 90 tablet 3  . estradiol (VIVELLE-DOT) 0.1 MG/24HR Place 1 patch (0.1 mg total) onto the skin 2 (two) times a week. Monday and Thursday 8 patch 11  . furosemide (LASIX) 20 MG tablet Take 1 tablet (20 mg total) by mouth every morning. 90 tablet 3  . glucose blood test strip Use as instructed 100 each 12  . loratadine (CLARITIN) 10 MG tablet Take 10 mg by mouth every evening.     . olmesartan (BENICAR) 5 MG tablet Take 1 tablet (5 mg total) by mouth daily. 90 tablet 3  . Pediatric Multivit-Minerals-C (ONE-A-DAY SCOOBY-DOO GUMMIES PO) Take 1 tablet by mouth daily.    . potassium chloride (K-DUR,KLOR-CON) 10 MEQ tablet Take 10 mEq by mouth daily.  3  . rosuvastatin (CRESTOR) 20 MG tablet Take 1 tablet (20 mg total) by mouth at bedtime. 90 tablet 3  . vitamin B-12 (CYANOCOBALAMIN) 100 MCG tablet Take 100 mcg by mouth daily.     No current facility-administered medications  for this visit.    Review of Systems  Constitutional: Negative for fever, chills, diaphoresis, activity change, appetite change, fatigue and unexpected weight change.  Cardiovascular: Negative for chest pain and palpitations.  Gastrointestinal: Negative for nausea, abdominal pain, diarrhea, constipation, blood in stool, abdominal distention and anal bleeding.  Psychiatric/Behavioral: Negative.        Objective:   Physical Exam BP 136/78 mmHg  Pulse 68  Temp(Src) 97 F (36.1 C) (Oral)  Resp 18  Wt 194 lb 6.4 oz (88.179 kg)  SpO2 97% General appearance: alert, cooperative, appears stated age and no distress Throat: lips, mucosa, and tongue normal; teeth and gums normal Neck: no adenopathy, supple, symmetrical, trachea midline and thyroid not enlarged, symmetric, no tenderness/mass/nodules Lungs: clear to auscultation  bilaterally Heart: S1, S2 normal Extremities: extremities normal, atraumatic, no cyanosis or edema  Sensory exam of the foot is normal, tested with the monofilament. Good pulses, no lesions or ulcers, good peripheral pulses.        Assessment & Plan:  1. Diabetes mellitus type II, controlled Check labs--diet controlled - Basic metabolic panel - Hemoglobin A1c  2. Essential hypertension Stable--- con't benicar  - Basic metabolic panel - Microalbumin / creatinine urine ratio - POCT urinalysis dipsr 3. Hyperlipidemia con't crestor - Hepatic function panel - Lipid panel

## 2014-07-16 NOTE — Patient Instructions (Signed)
Diabetes and Standards of Medical Care Diabetes is complicated. You may find that your diabetes team includes a dietitian, nurse, diabetes educator, eye doctor, and more. To help everyone know what is going on and to help you get the care you deserve, the following schedule of care was developed to help keep you on track. Below are the tests, exams, vaccines, medicines, education, and plans you will need. HbA1c test This test shows how well you have controlled your glucose over the past 2-3 months. It is used to see if your diabetes management plan needs to be adjusted.   It is performed at least 2 times a year if you are meeting treatment goals.  It is performed 4 times a year if therapy has changed or if you are not meeting treatment goals. Blood pressure test  This test is performed at every routine medical visit. The goal is less than 140/90 mm Hg for most people, but 130/80 mm Hg in some cases. Ask your health care provider about your goal. Dental exam  Follow up with the dentist regularly. Eye exam  If you are diagnosed with type 1 diabetes as a child, get an exam upon reaching the age of 37 years or older and have had diabetes for 3-5 years. Yearly eye exams are recommended after that initial eye exam.  If you are diagnosed with type 1 diabetes as an adult, get an exam within 5 years of diagnosis and then yearly.  If you are diagnosed with type 2 diabetes, get an exam as soon as possible after the diagnosis and then yearly. Foot care exam  Visual foot exams are performed at every routine medical visit. The exams check for cuts, injuries, or other problems with the feet.  A comprehensive foot exam should be done yearly. This includes visual inspection as well as assessing foot pulses and testing for loss of sensation.  Check your feet nightly for cuts, injuries, or other problems with your feet. Tell your health care provider if anything is not healing. Kidney function test (urine  microalbumin)  This test is performed once a year.  Type 1 diabetes: The first test is performed 5 years after diagnosis.  Type 2 diabetes: The first test is performed at the time of diagnosis.  A serum creatinine and estimated glomerular filtration rate (eGFR) test is done once a year to assess the level of chronic kidney disease (CKD), if present. Lipid profile (cholesterol, HDL, LDL, triglycerides)  Performed every 5 years for most people.  The goal for LDL is less than 100 mg/dL. If you are at high risk, the goal is less than 70 mg/dL.  The goal for HDL is 40 mg/dL-50 mg/dL for men and 50 mg/dL-60 mg/dL for women. An HDL cholesterol of 60 mg/dL or higher gives some protection against heart disease.  The goal for triglycerides is less than 150 mg/dL. Influenza vaccine, pneumococcal vaccine, and hepatitis B vaccine  The influenza vaccine is recommended yearly.  It is recommended that people with diabetes who are over 24 years old get the pneumonia vaccine. In some cases, two separate shots may be given. Ask your health care provider if your pneumonia vaccination is up to date.  The hepatitis B vaccine is also recommended for adults with diabetes. Diabetes self-management education  Education is recommended at diagnosis and ongoing as needed. Treatment plan  Your treatment plan is reviewed at every medical visit. Document Released: 04/25/2009 Document Revised: 11/12/2013 Document Reviewed: 11/28/2012 [facility] Patient Information 2015 [place],  [employer]. This information is not intended to replace advice given to you by your health care provider. Make sure you discuss any questions you have with your health care provider.  

## 2014-07-16 NOTE — Progress Notes (Signed)
Pre visit review using our clinic review tool, if applicable. No additional management support is needed unless otherwise documented below in the visit note. 

## 2014-07-17 ENCOUNTER — Other Ambulatory Visit (INDEPENDENT_AMBULATORY_CARE_PROVIDER_SITE_OTHER): Payer: 59

## 2014-07-17 DIAGNOSIS — K912 Postsurgical malabsorption, not elsewhere classified: Secondary | ICD-10-CM

## 2014-07-17 LAB — FERRITIN: Ferritin: 28.6 ng/mL (ref 10.0–291.0)

## 2014-07-18 ENCOUNTER — Other Ambulatory Visit: Payer: Self-pay | Admitting: *Deleted

## 2014-07-18 MED ORDER — GLUCOSE BLOOD VI STRP: ORAL_STRIP | Status: DC

## 2014-07-18 MED ORDER — BAYER CONTOUR LINK MONITOR W/DEVICE KIT: PACK | Status: DC

## 2014-07-22 ENCOUNTER — Telehealth: Payer: Self-pay | Admitting: Family Medicine

## 2014-07-22 NOTE — Telephone Encounter (Signed)
The patient wanted to know what to happen to the rest of the labs.       KP

## 2014-07-22 NOTE — Telephone Encounter (Signed)
Caller name: Mesa Relation to pt: self Call back number: [phone]. After 12 Pharmacy:  Reason for call:   Patient would like to discuss lab results. She wants to know what exactly was drawn last week?

## 2014-07-23 ENCOUNTER — Encounter: Payer: Self-pay | Admitting: Family Medicine

## 2014-07-31 ENCOUNTER — Ambulatory Visit: Payer: 59 | Admitting: Dietician

## 2014-08-01 ENCOUNTER — Other Ambulatory Visit: Payer: 59

## 2014-08-01 LAB — LIPID PANEL
Cholesterol: 136 mg/dL (ref 0–200)
HDL: 52.7 mg/dL (ref >39.00)
LDL Cholesterol: 66 mg/dL (ref 0–99)
NonHDL: 83.3
Total CHOL/HDL Ratio: 3
Triglycerides: 87 mg/dL (ref 0.0–149.0)
VLDL: 17.4 mg/dL (ref 0.0–40.0)

## 2014-08-01 LAB — POCT URINALYSIS DIPSTICK
Bilirubin, UA: NEGATIVE
Blood, UA: NEGATIVE
Glucose, UA: NEGATIVE
Ketones, UA: NEGATIVE
Leukocytes, UA: NEGATIVE
Nitrite, UA: NEGATIVE
Protein, UA: NEGATIVE
Spec Grav, UA: 1.015
Urobilinogen, UA: 0.2
pH, UA: 6

## 2014-08-01 LAB — BASIC METABOLIC PANEL
BUN: 16 mg/dL (ref 6–23)
CO2: 33 mEq/L — ABNORMAL HIGH (ref 19–32)
Calcium: 9.6 mg/dL (ref 8.4–10.5)
Chloride: 101 mEq/L (ref 96–112)
Creatinine, Ser: 0.82 mg/dL (ref 0.40–1.20)
GFR: 76.22 mL/min (ref >60.00)
Glucose, Bld: 98 mg/dL (ref 70–99)
Potassium: 4.6 mEq/L (ref 3.5–5.1)
Sodium: 137 mEq/L (ref 135–145)

## 2014-08-01 LAB — HEPATIC FUNCTION PANEL
ALT: 11 U/L (ref 0–35)
AST: 15 U/L (ref 0–37)
Albumin: 4.1 g/dL (ref 3.5–5.2)
Alkaline Phosphatase: 68 U/L (ref 39–117)
Bilirubin, Direct: 0.1 mg/dL (ref 0.0–0.3)
Total Bilirubin: 0.6 mg/dL (ref 0.2–1.2)
Total Protein: 7.1 g/dL (ref 6.0–8.3)

## 2014-08-01 LAB — MICROALBUMIN / CREATININE URINE RATIO
Creatinine,U: 5.9 mg/dL
Microalb Creat Ratio: 1.7 mg/g (ref 0.0–30.0)
Microalb, Ur: 0.1 mg/dL (ref 0.0–1.9)

## 2014-08-01 LAB — HEMOGLOBIN A1C: Hgb A1c MFr Bld: 5.9 % (ref 4.6–6.5)

## 2014-08-07 ENCOUNTER — Ambulatory Visit: Payer: 59 | Admitting: Dietician

## 2014-08-21 ENCOUNTER — Encounter: Payer: Self-pay | Admitting: Podiatry

## 2014-08-21 ENCOUNTER — Ambulatory Visit (INDEPENDENT_AMBULATORY_CARE_PROVIDER_SITE_OTHER): Payer: 59 | Admitting: Podiatry

## 2014-08-21 VITALS — BP 126/69 | HR 67 | Ht 71.0 in | Wt 191.0 lb

## 2014-08-21 DIAGNOSIS — L600 Ingrowing nail: Secondary | ICD-10-CM

## 2014-08-21 DIAGNOSIS — M79606 Pain in leg, unspecified: Secondary | ICD-10-CM

## 2014-08-21 DIAGNOSIS — M79671 Pain in right foot: Secondary | ICD-10-CM

## 2014-08-21 MED ORDER — EFINACONAZOLE 10 % EX SOLN: 1.0000 | Freq: Every morning | CUTANEOUS | Status: DC

## 2014-08-21 NOTE — Progress Notes (Signed)
Patient presents with painful ingrown nails that are bleeding through corner nail borders. Patient requests permanent fix.   Objective: Neurovascular status are within normal. Bleeding ingrown nail both great toe. Digital contracture without lesion on 2nd digit right.  Assessment: Painful ingrown nails both great toe nails at both borders.  Plan: Reviewed findings and available options.  Procedure done: Phenol and Alcohol Matrixectomy right great toe both borders. Affected right great toe was anesthetized with total 28ml mixture of 50/50 0.5% Marcaine plain and 1% Xylocaine plain. Both nail borders were reflected with a nail elevator and excised with nail nipper. Both borders of proximal nail matrix tissue was cauterized with Phenol soaked cotton applicator x 4 and neutralized with Alcohol soaked cotton applicator. The wound was dressed with Amerigel ointment dressing. Home care instructions and supply dispensed.  Return in 1 week for follow up.

## 2014-08-21 NOTE — Addendum Note (Signed)
Addended by: Camelia Phenes on: 08/21/2014 10:37 AM   Modules accepted: Orders

## 2014-08-21 NOTE — Patient Instructions (Signed)
Ingrown nail surgery done on right great toe. Follow soaking instruction and return in one week.

## 2014-08-23 ENCOUNTER — Telehealth: Payer: Self-pay | Admitting: *Deleted

## 2014-08-23 NOTE — Telephone Encounter (Signed)
08/23/14 Our FYI/ patient called this am and toe is a little red, can she put iodine or antibiotic cream on toe so it wont stick to the bandage,

## 2014-08-28 ENCOUNTER — Ambulatory Visit (INDEPENDENT_AMBULATORY_CARE_PROVIDER_SITE_OTHER): Payer: 59 | Admitting: Podiatry

## 2014-08-28 ENCOUNTER — Encounter: Payer: Self-pay | Admitting: Podiatry

## 2014-08-28 DIAGNOSIS — L600 Ingrowing nail: Secondary | ICD-10-CM

## 2014-08-28 MED ORDER — AMOXICILLIN-POT CLAVULANATE 500-125 MG PO TABS: 1.0000 | ORAL_TABLET | Freq: Two times a day (BID) | ORAL | Status: DC

## 2014-08-28 NOTE — Progress Notes (Signed)
Ingrown nail surgery post op. Has had redness over the weekend. Redness subsiding and wound is clean. Continue to use Amerigel. Take Augmentin 500/125 bid x 10 days. Return if redness continues.

## 2014-08-28 NOTE — Patient Instructions (Signed)
Post op wound. Clean and red. Redness fading down to brownish. Rx. Augmentin sent.

## 2014-08-29 ENCOUNTER — Encounter: Payer: 59 | Attending: Surgery | Admitting: Dietician

## 2014-08-29 DIAGNOSIS — Z6826 Body mass index (BMI) 26.0-26.9, adult: Secondary | ICD-10-CM | POA: Insufficient documentation

## 2014-08-29 DIAGNOSIS — Z713 Dietary counseling and surveillance: Secondary | ICD-10-CM | POA: Insufficient documentation

## 2014-08-29 NOTE — Progress Notes (Signed)
Follow-up visit:  14 Months Post-Operative Gastric Sleeve Surgery  Medical Nutrition Therapy:  Appt start time: 0810 end time:  0830.  Primary concerns today: Post-operative Bariatric Surgery Nutrition Management. [name] returns today with a 2.0 lb weight loss though 1.5 lbs fat mass loss. Has tried some carbs. Weight fluctuating some. Getting in protein and fluid.  No longer having low blood sugar readings. Will be looking into having a pannulectomy this summer.   Has not been able to exercise lately d/t ingrown tow nail. Was exercising 3-4 x week.    Surgery date: 07/03/12 Surgery type: LAGB Revision Start weight at [facility]: 262.6 lbs  Surgery date: 07/02/2013 Surgery type: Gastric Sleeve Weight today: 192.0 lbs  Weight change: 2.0 lbs weight gain, 1.5 lb fat mass loss Total weight lost:  70 lbs  Weight goal: 180 lbs % goal met: 87%  TANITA  BODY COMP RESULTS  09/15/11 01/31/12 05/08/12 08/30/12 11/06/12 01/04/13 01/24/13 03/23/13 06/21/13 07/17/13 09/03/13 10/18/13 01/23/14 04/29/14 08/29/14   BMI (kg/m^2) 30.3 31.4 32.6 31.0 32.1 31.5 31.9 31.7 32.8 30.3 29.3 28.1 27.8 26.6 26.8   FM (lbs) 100.5 105.5 111.0 105.0 110.5 106.5 109.0 112.0 116.5 104.5 95.5 89.5 84.0 83.5 82.0   FFM (lbs) 116.0 120.0 123.0 117.5 119.5 119.5 120.0 115.0 118.5 113.0 114.5 112.0 115.0 107.0 110.0   TBW (lbs) 85.0 88.0 90.0 86.0 87.5 87.5 88.0 84.0 87.0 82.5 84.0 82.0 84.0 78.5 80.5    Preferred Learning Style:   No preference indicated   Learning Readiness:   Ready  24-hr recall: B (AM): Protein Shake (atkins)  Or egg with cheese (12-15 g) Snk (AM): Dannon Light and Fit (12 g)  L (PM): 3 oz grilled chicken and salad and green beans or squash or ground beef and cheese  (21 g) Snk (PM): [place] and cheese sometimes has a starch (14 g) D PM): 3 oz chicken or flounder with collards (21 g) Snk (PM): none  Fluid intake:64 oz mostly water or crystal light Estimated total protein intake: 70-83 g    Medications: no longer taking Victoza, blood pressure medication "cut in half" Supplementation: taking  CBG monitoring: 2-3 x day  Average CBG per patient: 80-90 mg/dl fasting Last patient reported A1c: 5.9% in January (per patient)  Using straws: No Drinking while eating: No Hair loss: No Carbonated beverages: No N/V/D/C: Has some some loose stools after protein shakes (not as much). Dumping syndrome: No  Recent physical activity:  Not able to exercise lately d/t schedule (last 2 weeks) was exercising 3-4 x week  Progress Towards Goal(s):  In progress.  Nutritional Diagnosis:  [place]-3.3 Overweight/obesity related to past poor dietary habits and physical inactivity as evidenced by patient w/ recent Sleeve Gastrectomy surgery following dietary guidelines for continued weight loss.    Intervention:  Nutrition education/diet reinforcment. Plan to continue with Phase 3B High Protein + Non-Starchy Vegetable diet and increase physical activity when work schedule permits.   Goals:  Follow Phase 3B: High Protein + Non-Starchy Vegetables  Eat 3-6 small meals/snacks, every 3-5 hrs  Increase lean protein foods to meet 60g goal  Increase fluid intake to 64oz +  Avoid drinking 15 minutes before, during and 30 minutes after eating  Aim for >30 min of physical activity daily  If your blood sugar keeps going low, add about 15 g of carbohydrate along with protein in the middle of day   Teaching Method Utilized:  Visual Auditory Hands on  Barriers to learning/adherence to lifestyle change: none  Demonstrated degree of understanding via:  Teach Back   Monitoring/Evaluation:  Dietary intake, exercise, and body weight. Follow up in 3 months for 13 month post-op visit.

## 2014-08-29 NOTE — Patient Instructions (Addendum)
Goals:  Follow Phase 3B: High Protein + Non-Starchy Vegetables  Eat 3-6 small meals/snacks, every 3-5 hrs  Increase lean protein foods to meet 60g goal  Increase fluid intake to 64oz +  Avoid drinking 15 minutes before, during and 30 minutes after eating  Resume exercise when toe heals  Continue having protein foods first, then non starchy vegetables, and then add some carbs if you are still hungry  Surgery date: 07/03/12 Surgery type: LAGB revision Start weight at [facility]: 262.6 lbs  Surgery date: 07/02/2013 Surgery type: Gastric Sleeve Weight today: 192.0 lbs  Weight change: 2.0 lbs weight gain, 1.5 lb fat mass loss Total weight lost:  70 lbs  Weight goal: 180 lbs % goal met: 87%  TANITA  BODY COMP RESULTS  09/15/11 01/31/12 05/08/12 08/30/12 11/06/12 01/04/13 01/24/13 03/23/13 06/21/13 07/17/13 09/03/13 10/18/13 01/23/14 04/29/14 08/29/14   BMI (kg/m^2) 30.3 31.4 32.6 31.0 32.1 31.5 31.9 31.7 32.8 30.3 29.3 28.1 27.8 26.6 26.8   FM (lbs) 100.5 105.5 111.0 105.0 110.5 106.5 109.0 112.0 116.5 104.5 95.5 89.5 84.0 83.5 82.0   FFM (lbs) 116.0 120.0 123.0 117.5 119.5 119.5 120.0 115.0 118.5 113.0 114.5 112.0 115.0 107.0 110.0   TBW (lbs) 85.0 88.0 90.0 86.0 87.5 87.5 88.0 84.0 87.0 82.5 84.0 82.0 84.0 78.5 80.5

## 2014-08-30 ENCOUNTER — Ambulatory Visit (INDEPENDENT_AMBULATORY_CARE_PROVIDER_SITE_OTHER): Payer: Self-pay | Admitting: Family Medicine

## 2014-08-30 VITALS — BP 132/76 | HR 63 | Ht 71.0 in | Wt 192.0 lb

## 2014-08-30 DIAGNOSIS — E119 Type 2 diabetes mellitus without complications: Secondary | ICD-10-CM

## 2014-08-30 NOTE — Progress Notes (Signed)
Patient presents today for DM follow-up as part of employer-sponsored Link to Wellness Program. Medications, glucose readings, a1c and compliance have been reviewed. I have also discussed with patient lifestyle interventions including diet and exercise. Details of the visit can be found in [employer] documenting program through [facility] Patient has set a series of personal goals and will follow-up in no more than 3 months for further review of DM. 

## 2014-09-12 NOTE — Progress Notes (Signed)
Patient ID: [name], female   DOB: Sep 19, 1955, 59 y.o.   MRN: [id number] Reviewed: Agree with the documentation and management of our [place].

## 2014-10-28 ENCOUNTER — Other Ambulatory Visit: Payer: Self-pay | Admitting: Family Medicine

## 2014-12-04 ENCOUNTER — Ambulatory Visit: Payer: Self-pay | Admitting: Pharmacist

## 2014-12-04 ENCOUNTER — Ambulatory Visit (INDEPENDENT_AMBULATORY_CARE_PROVIDER_SITE_OTHER): Payer: Self-pay | Admitting: Family Medicine

## 2014-12-04 ENCOUNTER — Encounter: Payer: Self-pay | Admitting: Podiatry

## 2014-12-04 ENCOUNTER — Ambulatory Visit (INDEPENDENT_AMBULATORY_CARE_PROVIDER_SITE_OTHER): Payer: 59 | Admitting: Podiatry

## 2014-12-04 VITALS — BP 132/65 | HR 73 | Ht 71.0 in | Wt 191.8 lb

## 2014-12-04 VITALS — BP 127/74 | HR 69

## 2014-12-04 DIAGNOSIS — L570 Actinic keratosis: Secondary | ICD-10-CM | POA: Diagnosis not present

## 2014-12-04 DIAGNOSIS — E119 Type 2 diabetes mellitus without complications: Secondary | ICD-10-CM

## 2014-12-04 DIAGNOSIS — R234 Changes in skin texture: Secondary | ICD-10-CM

## 2014-12-04 DIAGNOSIS — L989 Disorder of the skin and subcutaneous tissue, unspecified: Secondary | ICD-10-CM

## 2014-12-04 LAB — POCT GLYCOSYLATED HEMOGLOBIN (HGB A1C): Hemoglobin A1C: 5.6

## 2014-12-04 NOTE — Progress Notes (Signed)
Subjective:  59 year old female presents requesting 2nd toe on right to be checked before she goes to beach next week. She has had infected fissure on her previous beach trip.   Objective:  Contracted digit 2nd right with distal callus with history of skin break down.  All nails appear normal. Neurovascular status are within norma.   Assessment: Contracted digit 2nd with keratotic skin build up at distal end right. Pain in lower limbs.   Plan:  Reviewed findings. Debrided keratotic lesion and nail 2nd digit right.  Return as needed.

## 2014-12-04 NOTE — Progress Notes (Signed)
Subjective:  Patient presents today for 3 month diabetes follow-up as part of the employer-sponsored Link to Wellness program.  Current diabetes regimen includes no agents to lower blood glucose. Patient continues on daily ACE Inhibitor and statin (but does not take aspirin due to past history of bariatric surgery and increased risk potential).  Most recent MD follow-up was 07/16/2014. No med changes or major health changes at this time.    Assessment/Plan:  Patient is a 59 yo female with DM 2. Today's a1c is 5.6% which is at goal of less than 6.5%. Weight is stable from previous visit with me (191.8 vs. 192). Lifestyle improvements:  Physical Activity-  3 days a week of walking for ~45 minutes  Nutrition-  Continued diet control based on bariatric guidelines.  Follow up with me in 6 months.    Goals for Next Visit:  1. Continue diet and exercise regimen as previous. No significant changes at this time.

## 2014-12-04 NOTE — Patient Instructions (Signed)
Seen for hypertrophic callused skin under 2nd toe nail right. Affected area debrided. Return in 3 months or as needed.

## 2014-12-09 ENCOUNTER — Encounter: Payer: Self-pay | Admitting: *Deleted

## 2014-12-09 ENCOUNTER — Emergency Department
Admission: EM | Admit: 2014-12-09 | Discharge: 2014-12-09 | Disposition: A | Discharge status: Home / Self Care | Payer: 59 | Source: Home / Self Care | Attending: Family Medicine | Admitting: Family Medicine

## 2014-12-09 DIAGNOSIS — B9789 Other viral agents as the cause of diseases classified elsewhere: Principal | ICD-10-CM

## 2014-12-09 DIAGNOSIS — J069 Acute upper respiratory infection, unspecified: Secondary | ICD-10-CM

## 2014-12-09 HISTORY — DX: Type 2 diabetes mellitus without complications: E11.9

## 2014-12-09 LAB — POCT RAPID STREP A (OFFICE): Rapid Strep A Screen: NEGATIVE

## 2014-12-09 MED ORDER — DOXYCYCLINE HYCLATE 100 MG PO CAPS: 100.0000 mg | ORAL_CAPSULE | Freq: Two times a day (BID) | ORAL | Status: DC

## 2014-12-09 MED ORDER — GUAIFENESIN-CODEINE 100-10 MG/5ML PO SOLN: ORAL | Status: DC

## 2014-12-09 MED ORDER — PREDNISONE 20 MG PO TABS: 20.0000 mg | ORAL_TABLET | Freq: Two times a day (BID) | ORAL | Status: DC

## 2014-12-09 NOTE — ED Provider Notes (Signed)
CSN: [id number]     Arrival date & time 12/09/14  1016 History   First MD Initiated Contact with Patient 12/09/14 1053     Chief Complaint  Patient presents with  . Otalgia  . Sore Throat      HPI Comments: Patient reports that she started "clearing her throat" two days ago.  Yesterday she developed a sore throat, bilateral earache, headache, myalgias, fatigue, and non-productive cough.  Today she developed sweats and fever to 100.8.  She has a past history of wheezing and shortness of breath when she develops a cold. Family history of asthma in her mother.  The history is provided by the patient.    Past Medical History  Diagnosis Date  . Allergic rhinitis   . Menopause   . GERD (gastroesophageal reflux disease)   . IBS (irritable bowel syndrome)   . Anxiety   . Depression   . Hypertension   . Hyperlipemia   . Asthma     hx bronchial asthma at times with upper resp infection  . Headache(784.0)     migraine and cluster headaches  . Stress incontinence     at times  . Neuromuscular disorder 2009    hx of fibromyalgia, polyarthralsia (surgery induced)  . Diabetes mellitus without complication    Past Surgical History  Procedure Laterality Date  . Cesarean section  1989 & 1993    X 2  . Cervical laminectomy  2005 & 2009    X 2   NO ROM PROBLEMS  . Foot surgery  2005    RT HEEL  . Right knee  1981    ARTHROSCOPY AND ARTHROTOMY  . Nasal sinus surgery  1995 & 1997    X 2  . Laparoscopic gastric banding  12/30/09  . Esophagogastroduodenoscopy  06/29/2011    Procedure: ESOPHAGOGASTRODUODENOSCOPY (EGD);  Surgeon: David H Newman, MD;  Location: [facility];  Service: General;  Laterality: N/A;  . Hysteroscopy  1999  . Laparoscopy  09/24/2011    Procedure: LAPAROSCOPY DIAGNOSTIC;  Surgeon: Matthew B Martin, MD;  Location: [facility];  Service: General;  Laterality: N/A;  . Gastric banding port revision  09/24/2011    Procedure: GASTRIC BANDING PORT REVISION;  Surgeon: Matthew B  Martin, MD;  Location: [facility];  Service: General;  Laterality: N/A;  . Tonsillectomy  1971  . Abdominal hysterectomy  12/1999  . Cholecystectomy  1986  . Tubal ligation  1993    WITH C -SECTION  . Right knee arthroscopy and arthrotomy  12-1979  . Cervical conization w/bx  june 1990    dysplasia of cervix/used 5Fu cream for 3 months  . Laparoscopy  07/03/2012    Procedure: LAPAROSCOPY DIAGNOSTIC;  Surgeon: Matthew B Martin, MD;  Location: [facility];  Service: General;  Laterality: N/A;  Exploratory Laparoscopy   . Laparoscopic revision of gastric band  07/03/2012    Procedure: LAPAROSCOPIC REVISION OF GASTRIC BAND;  Surgeon: Matthew B Martin, MD;  Location: [facility];  Service: General;  Laterality: N/A;  removal of old lap. band port, replaced with AP standard band  . Mesh applied to lap port  07/03/2012    Procedure: MESH APPLIED TO LAP PORT;  Surgeon: Matthew B Martin, MD;  Location: [facility];  Service: General;  Laterality: N/A;  . Dilation and curettage of uterus   april-1989    missed abortion  . Laparoscopic repair and removal of gastric band N/A 07/02/2013    Procedure: LAPAROSCOPIC REMOVAL OF GASTRIC BAND;    Surgeon: Pedro Earls, MD;  Location: [facility];  Service: General;  Laterality: N/A;  . Laparoscopic gastric sleeve resection N/A 07/02/2013    Procedure: LAPAROSCOPIC GASTRIC SLEEVE RESECTION upper endoscopy;  Surgeon: Pedro Earls, MD;  Location: [facility];  Service: General;  Laterality: N/A;   Family History  Problem Relation Age of Onset  . Diabetes Mother   . Stroke Mother   . Breast cancer Mother   . Atrial fibrillation Mother   . Hypertension Mother   . Cancer Mother     breast  . Diabetes Father   . Stroke Father   . Hypertension Father   . Heart attack Father   . Thyroid disease    . Heart disease     History  Substance Use Topics  . Smoking status: Never Smoker   . Smokeless tobacco: Never Used  . Alcohol Use: No   OB History    Gravida Para Term Preterm AB  TAB SAB Ectopic Multiple Living   3 0 0 0 1 0 1 0 0 2     Review of Systems + sore throat + cough No pleuritic pain No wheezing + nasal congestion + post-nasal drainage No sinus pain/pressure No itchy/red eyes + earache No hemoptysis No SOB + fever, + chills + nausea No vomiting No abdominal pain No diarrhea No urinary symptoms No skin rash + fatigue + myalgias + headache Used OTC meds without relief  Allergies  Isoniazid; Nitrofurantoin; Hctz; Promethazine hcl; Macrolides and ketolides; Morphine; Percocet; Roxicet; Sulfamethoxazole-trimethoprim; and Fentanyl  Home Medications   Prior to Admission medications   Medication Sig Start Date End Date Taking? Authorizing Provider  Blood Glucose Monitoring Suppl (BAYER CONTOUR LINK MONITOR) W/DEVICE KIT Test blood sugar once daily DX code: E11.9 07/18/14   [name], DO  Ca Phosphate-Cholecalciferol (CALTRATE GUMMY BITES PO) Take 2 tablets by mouth at bedtime.    Historical Provider, MD  citalopram (CELEXA) 20 MG tablet Take 1 tablet (20 mg total) by mouth every evening. 07/16/14   [name], DO  CRESTOR 20 MG tablet TAKE 1 TABLET (20 MG TOTAL) BY MOUTH AT BEDTIME. 10/28/14   [name], DO  cyclobenzaprine (FLEXERIL) 10 MG tablet Take 1 tablet (10 mg total) by mouth 3 (three) times daily as needed for muscle spasms. 07/16/14   [name], DO  doxycycline (VIBRAMYCIN) 100 MG capsule Take 1 capsule (100 mg total) by mouth 2 (two) times daily. Take with food. 12/09/14   Kandra Nicolas, MD  Efinaconazole (JUBLIA) 10 % SOLN Apply 1 applicator topically every morning. 08/21/14   [name], DPM  estradiol (VIVELLE-DOT) 0.1 MG/24HR Place 1 patch (0.1 mg total) onto the skin 2 (two) times a week. Monday and Thursday 06/27/12   [name], MD  furosemide (LASIX) 20 MG tablet TAKE 1 TABLET (20 MG TOTAL) BY MOUTH EVERY MORNING. 10/28/14   [name], DO  glucose blood (ONETOUCH VERIO) test strip Check blood sugar  daily 07/16/14   [name], DO  glucose blood test strip Use as instructed 07/16/14   [name], DO  glucose blood test strip Test blood sugar once daily. Dx Code: E11.9 07/18/14   [name], DO  guaiFENesin-codeine 100-10 MG/5ML syrup Take 67m by mouth at bedtime as needed for cough 12/09/14   SKandra Nicolas MD  loratadine (CLARITIN) 10 MG tablet Take 10 mg by mouth every evening.     Historical Provider, MD  olmesartan (BENICAR) 5 MG tablet Take 1 tablet (5 mg total) by mouth daily. 12/24/13   [name], DO  ONETOUCH DELICA LANCETS FINE MISC check blood sugar daily 07/16/14   [name], DO  Pediatric Multivit-Minerals-C (ONE-A-DAY SCOOBY-DOO GUMMIES PO) Take 1 tablet by mouth daily.    Historical Provider, MD  potassium chloride (K-DUR,KLOR-CON) 10 MEQ tablet TAKE 1 TABLET (10 MEQ TOTAL) BY MOUTH DAILY. 10/28/14   [name], DO  predniSONE (DELTASONE) 20 MG tablet Take 1 tablet (20 mg total) by mouth 2 (two) times daily. Take with food. 12/09/14   Stephen A Beese, MD  vitamin B-12 (CYANOCOBALAMIN) 100 MCG tablet Take 100 mcg by mouth daily.    Historical Provider, MD   BP 152/87 mmHg  Pulse 60  Temp(Src) 98.6 F (37 C) (Oral)  Resp 18  Ht 5' 11" (1.803 m)  Wt 195 lb (88.451 kg)  BMI 27.21 kg/m2  SpO2 99% Physical Exam Nursing notes and Vital Signs reviewed. Appearance:  Patient appears stated age, and in no acute distress Eyes:  Pupils are equal, round, and reactive to light and accomodation.  Extraocular movement is intact.  Conjunctivae are not inflamed  Ears:  Canals normal.  Tympanic membranes normal.  Nose:  Mildly congested turbinates.  No sinus tenderness.    Pharynx:  Normal Neck:  Supple.   Tender enlarged posterior nodes are palpated bilaterally  Lungs:  Clear to auscultation.  Breath sounds are equal. Chest:  Tenderness to palpation over the mid-sternum.   Heart:  Regular rate and rhythm without murmurs, rubs, or gallops.  Abdomen:  Nontender without  masses or hepatosplenomegaly.  Bowel sounds are present.  No CVA or flank tenderness.  Extremities:  No edema.  No calf tenderness Skin:  No rash present.   ED Course  Procedures  None    Labs Reviewed  POCT RAPID STREP A (OFFICE) negative         MDM   1. Viral URI with cough    Although likely viral, will begin doxycycline for atypical coverage, and prednisone burst. Rx for Robitussin AC for night time cough (has no allergy to codeine) Take plain guaifenesin (such as Robitussin) with plenty of water, for cough and congestion.  May add Pseudoephedrine (30mg, one or two every 4 to 6 hours) for sinus congestion.  Get adequate rest.   May use Afrin nasal spray (or generic oxymetazoline) twice daily for about 5 days.  Also recommend using saline nasal spray several times daily and saline nasal irrigation (AYR is a common brand).Try warm salt water gargles for sore throat.  Stop all antihistamines for now, and other non-prescription cough/cold preparations.   Follow-up with family doctor if not improving about 7 to10 days.    Stephen A Beese, MD 12/09/14 1323 

## 2014-12-09 NOTE — ED Notes (Signed)
Pt c/o bilateral ear ache, sore throat, HA, fever, and cough x 1 day. Took Tylenol at 0500 today.

## 2014-12-09 NOTE — Discharge Instructions (Signed)
Take plain guaifenesin (such as Robitussin) with plenty of water, for cough and congestion.  May add Pseudoephedrine (30mg , one or two every 4 to 6 hours) for sinus congestion.  Get adequate rest.   May use Afrin nasal spray (or generic oxymetazoline) twice daily for about 5 days.  Also recommend using saline nasal spray several times daily and saline nasal irrigation (AYR is a common brand).Try warm salt water gargles for sore throat.  Stop all antihistamines for now, and other non-prescription cough/cold preparations.   Follow-up with family doctor if not improving about 7 to10 days.

## 2014-12-12 ENCOUNTER — Telehealth: Payer: Self-pay | Admitting: *Deleted

## 2014-12-12 NOTE — Progress Notes (Signed)
ATTENDING PHYSICIAN NOTE: I have reviewed the chart and agree with the plan as detailed above. Cherity Blickenstaff MD Pager [phone]  

## 2014-12-24 ENCOUNTER — Ambulatory Visit: Payer: 59 | Admitting: Podiatry

## 2015-01-17 ENCOUNTER — Encounter: Payer: Self-pay | Admitting: Emergency Medicine

## 2015-01-17 ENCOUNTER — Emergency Department
Admission: EM | Admit: 2015-01-17 | Discharge: 2015-01-17 | Disposition: A | Discharge status: Home / Self Care | Payer: 59 | Source: Home / Self Care | Attending: Emergency Medicine | Admitting: Emergency Medicine

## 2015-01-17 DIAGNOSIS — J209 Acute bronchitis, unspecified: Secondary | ICD-10-CM

## 2015-01-17 MED ORDER — DOXYCYCLINE HYCLATE 100 MG PO CAPS: 100.0000 mg | ORAL_CAPSULE | Freq: Two times a day (BID) | ORAL | Status: DC

## 2015-01-17 MED ORDER — PROMETHAZINE-CODEINE 6.25-10 MG/5ML PO SYRP: ORAL_SOLUTION | ORAL | Status: DC

## 2015-01-17 MED ORDER — PREDNISONE 10 MG PO TABS: ORAL_TABLET | ORAL | Status: DC

## 2015-01-17 NOTE — ED Notes (Signed)
Pt c/o cough with yellow mucous, congestion, and sore throat since yesterday.

## 2015-01-17 NOTE — ED Provider Notes (Signed)
CSN: [id number]     Arrival date & time 01/17/15  1742  [facility] History   None    Chief Complaint  Patient presents with  . Nasal Congestion  . Cough    HPI  [name] is a 59 y.o. female who complains of onset of cough, congestion, sore throat for several days, progressively worsening  Have been using over-the-counter treatment which helps a little bit. She states that in the past, she's needed a short course of prednisone and an antibiotic and she requests promethazine with codeine as that has helped in the past to help nighttime cough and she has done well with that without any side effects  No chills/sweats +  Fever  +  Nasal congestion +  Discolored Post-nasal drainage No sinus pain/pressure Positive sore throat  +  cough She feels she might have mild nighttime wheezing last night Positive chest congestion No hemoptysis No shortness of breath No pleuritic pain  No itchy/red eyes No earache  No nausea No vomiting No abdominal pain No diarrhea  No skin rashes +  Fatigue No myalgias No headache  Family history of asthma in her mother. Past Medical History  Diagnosis Date  . Allergic rhinitis   . Menopause   . GERD (gastroesophageal reflux disease)   . IBS (irritable bowel syndrome)   . Anxiety   . Depression   . Hypertension   . Hyperlipemia   . Asthma     hx bronchial asthma at times with upper resp infection  . Headache(784.0)     migraine and cluster headaches  . Stress incontinence     at times  . Neuromuscular disorder 2009    hx of fibromyalgia, polyarthralsia (surgery induced)  . Diabetes mellitus without complication    Past Surgical History  Procedure Laterality Date  . Cesarean section  1989 & 1993    X 2  . Cervical laminectomy  2005 & 2009    X 2   NO ROM PROBLEMS  . Foot surgery  2005    RT HEEL  . Right knee  1981    ARTHROSCOPY AND ARTHROTOMY  . Nasal sinus surgery  1995 & 1997    X 2  . Laparoscopic gastric  banding  12/30/09  . Esophagogastroduodenoscopy  06/29/2011    Procedure: ESOPHAGOGASTRODUODENOSCOPY (EGD);  Surgeon: Shann Medal, MD;  Location: [facility];  Service: General;  Laterality: N/A;  . Hysteroscopy  1999  . Laparoscopy  09/24/2011    Procedure: LAPAROSCOPY DIAGNOSTIC;  Surgeon: Pedro Earls, MD;  Location: [facility];  Service: General;  Laterality: N/A;  . Gastric banding port revision  09/24/2011    Procedure: GASTRIC BANDING PORT REVISION;  Surgeon: Pedro Earls, MD;  Location: [facility];  Service: General;  Laterality: N/A;  . Tonsillectomy  1971  . Abdominal hysterectomy  12/1999  . Cholecystectomy  1986  . Tubal ligation  1993    WITH C -SECTION  . Right knee arthroscopy and arthrotomy  12-1979  . Cervical conization w/bx  june 1990    dysplasia of cervix/used 5Fu cream for 3 months  . Laparoscopy  07/03/2012    Procedure: LAPAROSCOPY DIAGNOSTIC;  Surgeon: Pedro Earls, MD;  Location: [facility];  Service: General;  Laterality: N/A;  Exploratory Laparoscopy   . Laparoscopic revision of gastric band  07/03/2012    Procedure: LAPAROSCOPIC REVISION OF GASTRIC BAND;  Surgeon: Pedro Earls, MD;  Location: [facility];  Service: General;  Laterality: N/A;  removal of old lap. band port, replaced with AP standard band  . Mesh applied to lap port  07/03/2012    Procedure: MESH APPLIED TO LAP PORT;  Surgeon: Pedro Earls, MD;  Location: [facility];  Service: General;  Laterality: N/A;  . Dilation and curettage of uterus   april-1989    missed abortion  . Laparoscopic repair and removal of gastric band N/A 07/02/2013    Procedure: LAPAROSCOPIC REMOVAL OF GASTRIC BAND;  Surgeon: Pedro Earls, MD;  Location: [facility];  Service: General;  Laterality: N/A;  . Laparoscopic gastric sleeve resection N/A 07/02/2013    Procedure: LAPAROSCOPIC GASTRIC SLEEVE RESECTION upper endoscopy;  Surgeon: Pedro Earls, MD;  Location: [facility];  Service: General;  Laterality: N/A;   Family  History  Problem Relation Age of Onset  . Diabetes Mother   . Stroke Mother   . Breast cancer Mother   . Atrial fibrillation Mother   . Hypertension Mother   . Cancer Mother     breast  . Diabetes Father   . Stroke Father   . Hypertension Father   . Heart attack Father   . Thyroid disease    . Heart disease     History  Substance Use Topics  . Smoking status: Never Smoker   . Smokeless tobacco: Never Used  . Alcohol Use: No   OB History    Gravida Para Term Preterm AB TAB SAB Ectopic Multiple Living   _0     Review of Systems Remainder of Review of Systems negative for acute change except as noted in the HPI.  Allergies  Isoniazid; Nitrofurantoin; Hctz; Promethazine hcl; Macrolides and ketolides; Morphine; Percocet; Roxicet; Sulfamethoxazole-trimethoprim; and Fentanyl She has taken by mouth Phenergan and by mouth codeine in the past without side effects Home Medications   Prior to Admission medications   Medication Sig Start Date End Date Taking? Authorizing Provider  Blood Glucose Monitoring Suppl (BAYER CONTOUR LINK MONITOR) W/DEVICE KIT Test blood sugar once daily DX code: E11.9 07/18/14   [name], DO  Ca Phosphate-Cholecalciferol (CALTRATE GUMMY BITES PO) Take 2 tablets by mouth at bedtime.    Historical Provider, MD  citalopram (CELEXA) 20 MG tablet Take 1 tablet (20 mg total) by mouth every evening. 07/16/14   [name], DO  CRESTOR 20 MG tablet TAKE 1 TABLET (20 MG TOTAL) BY MOUTH AT BEDTIME. 10/28/14   [name], DO  cyclobenzaprine (FLEXERIL) 10 MG tablet Take 1 tablet (10 mg total) by mouth 3 (three) times daily as needed for muscle spasms. 07/16/14   [name], DO  doxycycline (VIBRAMYCIN) 100 MG capsule Take 1 capsule (100 mg total) by mouth 2 (two) times daily. 01/17/15   [name], MD  Efinaconazole (JUBLIA) 10 % SOLN Apply 1 applicator topically every morning. 08/21/14   [name], DPM  estradiol (VIVELLE-DOT) 0.1  MG/24HR Place 1 patch (0.1 mg total) onto the skin 2 (two) times a week. Monday and Thursday 06/27/12   [name], MD  furosemide (LASIX) 20 MG tablet TAKE 1 TABLET (20 MG TOTAL) BY MOUTH EVERY MORNING. 10/28/14   [name], DO  glucose blood (ONETOUCH VERIO) test strip Check blood sugar daily 07/16/14   [name], DO  glucose blood test strip Use as instructed 07/16/14   [name], DO  glucose blood test strip Test blood sugar once daily. Dx Code: E11.9  07/18/14   [name], DO  guaiFENesin-codeine 100-10 MG/5ML syrup Take 58m by mouth at bedtime as needed for cough 12/09/14   SKandra Nicolas MD  loratadine (CLARITIN) 10 MG tablet Take 10 mg by mouth every evening.     Historical Provider, MD  olmesartan (BENICAR) 5 MG tablet Take 1 tablet (5 mg total) by mouth daily. 12/24/13   YRosalita Chessman DO  ONETOUCH DELICA LANCETS FINE MISC check blood sugar daily 07/16/14   YRosalita Chessman DO  Pediatric Multivit-Minerals-C (ONE-A-DAY SCOOBY-DOO GUMMIES PO) Take 1 tablet by mouth daily.    Historical Provider, MD  potassium chloride (K-DUR,KLOR-CON) 10 MEQ tablet TAKE 1 TABLET (10 MEQ TOTAL) BY MOUTH DAILY. 10/28/14   YRosalita Chessman DO  predniSONE (DELTASONE) 10 MG tablet Take 2 by mouth twice a day for 5 days 01/17/15   DJacqulyn Cane MD  promethazine-codeine ([facility]) 6.25-10 MG/5ML syrup Take 1-2 teaspoons every 4-6 hours as needed for cough. May cause drowsiness. 01/17/15   DJacqulyn Cane MD  vitamin B-12 (CYANOCOBALAMIN) 100 MCG tablet Take 100 mcg by mouth daily.    Historical Provider, MD   BP 151/80 mmHg  Pulse 82  Temp(Src) 99.4 F (37.4 C) (Oral)  SpO2 99% Physical Exam  Constitutional: She is oriented to person, place, and time. She appears well-developed and well-nourished. No distress.  HENT:  Head: Normocephalic and atraumatic.  Right Ear: Tympanic membrane normal.  Left Ear: Tympanic membrane normal.  Mouth/Throat: Oropharynx is clear and moist. No  oropharyngeal exudate.  Nose with boggy terminates, mild seromucoid drainage. Pharynx negative except injected  Eyes: Right eye exhibits no discharge. Left eye exhibits no discharge. No scleral icterus.  Neck: Neck supple. No JVD present.  Cardiovascular: Normal rate, regular rhythm and normal heart sounds.   Pulmonary/Chest: No respiratory distress. She has wheezes (Rare, minimal late expiratory wheeze. Otherwise, no wheezing). She has rhonchi. She has no rales.  Lymphadenopathy:    She has no cervical adenopathy.  Neurological: She is alert and oriented to person, place, and time.  Skin: Skin is warm and dry.  Nursing note and vitals reviewed.   ED Course  Procedures (including critical care time) Labs Review Labs Reviewed - No data to display  Imaging Review No results found.   MDM   1. Acute bronchitis, unspecified organism    with mild asthmatic bronchitis Treatment options discussed, as well as risks, benefits, alternatives. Patient voiced understanding and agreement with the following plans: New Prescriptions   DOXYCYCLINE (VIBRAMYCIN) 100 MG CAPSULE    Take 1 capsule (100 mg total) by mouth 2 (two) times daily.   PREDNISONE (DELTASONE) 10 MG TABLET    Take 2 by mouth twice a day for 5 days   PROMETHAZINE-CODEINE (PHENERGAN WITH CODEINE) 6.25-10 MG/5ML SYRUP    Take 1-2 teaspoons every 4-6 hours as needed for cough. May cause drowsiness.   Other symptomatic care discussed Follow-up with your primary care doctor in 5-7 days if not improving, or sooner if symptoms become worse. Precautions discussed. Red flags discussed. Questions invited and answered. Patient voiced understanding and agreement.     DJacqulyn Cane MD 01/17/15 1314 [id number]

## 2015-01-20 ENCOUNTER — Ambulatory Visit: Payer: 59 | Admitting: Family Medicine

## 2015-01-21 ENCOUNTER — Emergency Department
Admission: EM | Admit: 2015-01-21 | Discharge: 2015-01-21 | Disposition: A | Discharge status: Home / Self Care | Payer: 59 | Source: Home / Self Care | Attending: Family Medicine | Admitting: Family Medicine

## 2015-01-21 ENCOUNTER — Encounter: Payer: Self-pay | Admitting: *Deleted

## 2015-01-21 DIAGNOSIS — J209 Acute bronchitis, unspecified: Secondary | ICD-10-CM

## 2015-01-21 MED ORDER — DM-GUAIFENESIN ER 30-600 MG PO TB12: 1.0000 | ORAL_TABLET | Freq: Two times a day (BID) | ORAL | Status: DC

## 2015-01-21 NOTE — ED Notes (Signed)
Pt c/o fever, night sweats, and thick green/yellow mucus x 2 days after her visit on 01/17/15.

## 2015-01-21 NOTE — ED Provider Notes (Signed)
CSN: [id number]     Arrival date & time 01/21/15  0805 History   First MD Initiated Contact with Patient 01/21/15 [phone]     Chief Complaint  Patient presents with  . Fever   (Consider location/radiation/quality/duration/timing/severity/associated sxs/prior Treatment) HPI  Patient is a 59 year old female presenting to urgent care for second time since 01/17/15 with reports of flulike symptoms including fevers, chills, night sweats, and moderately productive cough with yellow-green mucus.  Denies hemoptysis.  When patient was seen initially for the symptoms she was diagnosed with acute bronchitis and started on a short course of prednisone as well as doxycycline.  Patient states she has only improved minimally.  Patient states she does have 3-4 more days of the antibiotic.  She has only been taking Tylenol for fever given she has a gastric sleeve and is unable take ibuprofen.  Patient is unsure how high her temperature has gotten.  She denies nausea, vomiting or diarrhea.  Patient does have nasal congestion, sore throat and headache.  Patient is not taking a cough expectorant.  Denies recent travel or sick contacts.  Denies known tick bites or rashes.  Denies neck pain or stiffness.  Denies chest pain or shortness of breath.  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  Past Medical History  Diagnosis Date  . Allergic rhinitis   . Menopause   . GERD (gastroesophageal reflux disease)   . IBS (irritable bowel syndrome)   . Anxiety   . Depression   . Hypertension   . Hyperlipemia   . Asthma     hx bronchial asthma at times with upper resp infection  . Headache(784.0)     migraine and cluster headaches  . Stress incontinence     at times  . Neuromuscular disorder 2009    hx of fibromyalgia, polyarthralsia (surgery induced)  . Diabetes mellitus without complication    Past Surgical History  Procedure Laterality Date  . Cesarean section   1989 & 1993    X 2  . Cervical laminectomy  2005 & 2009    X 2   NO ROM PROBLEMS  . Foot surgery  2005    RT HEEL  . Right knee  1981    ARTHROSCOPY AND ARTHROTOMY  . Nasal sinus surgery  1995 & 1997    X 2  . Laparoscopic gastric banding  12/30/09  . Esophagogastroduodenoscopy  06/29/2011    Procedure: ESOPHAGOGASTRODUODENOSCOPY (EGD);  Surgeon: Shann Medal, MD;  Location: [facility];  Service: General;  Laterality: N/A;  . Hysteroscopy  1999  . Laparoscopy  09/24/2011    Procedure: LAPAROSCOPY DIAGNOSTIC;  Surgeon: Pedro Earls, MD;  Location: [facility];  Service: General;  Laterality: N/A;  . Gastric banding port revision  09/24/2011    Procedure: GASTRIC BANDING PORT REVISION;  Surgeon: Pedro Earls, MD;  Location: [facility];  Service: General;  Laterality: N/A;  . Tonsillectomy  1971  . Abdominal hysterectomy  12/1999  . Cholecystectomy  1986  . Tubal ligation  1993    WITH C -SECTION  . Right knee arthroscopy and arthrotomy  12-1979  . Cervical conization w/bx  june 1990    dysplasia of cervix/used 5Fu cream for 3 months  . Laparoscopy  07/03/2012    Procedure: LAPAROSCOPY DIAGNOSTIC;  Surgeon: Pedro Earls, MD;  Location: [facility];  Service: General;  Laterality: N/A;  Exploratory Laparoscopy   . Laparoscopic  revision of gastric band  07/03/2012    Procedure: LAPAROSCOPIC REVISION OF GASTRIC BAND;  Surgeon: Pedro Earls, MD;  Location: [facility];  Service: General;  Laterality: N/A;  removal of old lap. band port, replaced with AP standard band  . Mesh applied to lap port  07/03/2012    Procedure: MESH APPLIED TO LAP PORT;  Surgeon: Pedro Earls, MD;  Location: [facility];  Service: General;  Laterality: N/A;  . Dilation and curettage of uterus   april-1989    missed abortion  . Laparoscopic repair and removal of gastric band N/A 07/02/2013    Procedure: LAPAROSCOPIC REMOVAL OF GASTRIC BAND;  Surgeon: Pedro Earls, MD;  Location: [facility];  Service: General;   Laterality: N/A;  . Laparoscopic gastric sleeve resection N/A 07/02/2013    Procedure: LAPAROSCOPIC GASTRIC SLEEVE RESECTION upper endoscopy;  Surgeon: Pedro Earls, MD;  Location: [facility];  Service: General;  Laterality: N/A;   Family History  Problem Relation Age of Onset  . Diabetes Mother   . Stroke Mother   . Breast cancer Mother   . Atrial fibrillation Mother   . Hypertension Mother   . Cancer Mother     breast  . Diabetes Father   . Stroke Father   . Hypertension Father   . Heart attack Father   . Thyroid disease    . Heart disease     History  Substance Use Topics  . Smoking status: Never Smoker   . Smokeless tobacco: Never Used  . Alcohol Use: No   OB History    Gravida Para Term Preterm AB TAB SAB Ectopic Multiple Living   _0     Review of Systems  Constitutional: Positive for fever, chills and fatigue.  HENT: Positive for congestion and sore throat. Negative for trouble swallowing and voice change.   Respiratory: Positive for cough and wheezing. Negative for shortness of breath.   Cardiovascular: Negative for chest pain and palpitations.  Gastrointestinal: Negative for nausea, vomiting, abdominal pain and diarrhea.  Genitourinary: Negative for dysuria, urgency, hematuria and flank pain.  Musculoskeletal: Positive for myalgias and arthralgias. Negative for neck pain and neck stiffness.  Skin: Negative for rash and wound.  Neurological: Positive for headaches. Negative for dizziness and light-headedness.  All other systems reviewed and are negative.   Allergies  Isoniazid; Nitrofurantoin; Hctz; Promethazine hcl; Macrolides and ketolides; Morphine; Percocet; Roxicet; Sulfamethoxazole-trimethoprim; and Fentanyl  Home Medications   Prior to Admission medications   Medication Sig Start Date End Date Taking? Authorizing Provider  Blood Glucose Monitoring Suppl (BAYER CONTOUR LINK MONITOR) W/DEVICE KIT Test blood sugar once daily DX code:  E11.9 07/18/14   [name], DO  Ca Phosphate-Cholecalciferol (CALTRATE GUMMY BITES PO) Take 2 tablets by mouth at bedtime.    Historical Provider, MD  citalopram (CELEXA) 20 MG tablet Take 1 tablet (20 mg total) by mouth every evening. 07/16/14   [name], DO  CRESTOR 20 MG tablet TAKE 1 TABLET (20 MG TOTAL) BY MOUTH AT BEDTIME. 10/28/14   [name], DO  cyclobenzaprine (FLEXERIL) 10 MG tablet Take 1 tablet (10 mg total) by mouth 3 (three) times daily as needed for muscle spasms. 07/16/14   [name], DO  dextromethorphan-guaiFENesin (MUCINEX DM) 30-600 MG per 12 hr tablet Take 1 tablet by mouth 2 (two) times daily. Drink a large glass of water with each dose 01/21/15   Noland Fordyce,  PA-C  doxycycline (VIBRAMYCIN) 100 MG capsule Take 1 capsule (100 mg total) by mouth 2 (two) times daily. 01/17/15   [name], MD  Efinaconazole (JUBLIA) 10 % SOLN Apply 1 applicator topically every morning. 08/21/14   [name], DPM  estradiol (VIVELLE-DOT) 0.1 MG/24HR Place 1 patch (0.1 mg total) onto the skin 2 (two) times a week. Monday and Thursday 06/27/12   [name], MD  furosemide (LASIX) 20 MG tablet TAKE 1 TABLET (20 MG TOTAL) BY MOUTH EVERY MORNING. 10/28/14   [name], DO  glucose blood (ONETOUCH VERIO) test strip Check blood sugar daily 07/16/14   [name], DO  glucose blood test strip Use as instructed 07/16/14   [name], DO  glucose blood test strip Test blood sugar once daily. Dx Code: E11.9 07/18/14   [name], DO  guaiFENesin-codeine 100-10 MG/5ML syrup Take 78m by mouth at bedtime as needed for cough 12/09/14   SKandra Nicolas MD  loratadine (CLARITIN) 10 MG tablet Take 10 mg by mouth every evening.     Historical Provider, MD  olmesartan (BENICAR) 5 MG tablet Take 1 tablet (5 mg total) by mouth daily. 12/24/13   YRosalita Chessman DO  ONETOUCH DELICA LANCETS FINE MISC check blood sugar daily 07/16/14   YRosalita Chessman DO  Pediatric Multivit-Minerals-C  (ONE-A-DAY SCOOBY-DOO GUMMIES PO) Take 1 tablet by mouth daily.    Historical Provider, MD  potassium chloride (K-DUR,KLOR-CON) 10 MEQ tablet TAKE 1 TABLET (10 MEQ TOTAL) BY MOUTH DAILY. 10/28/14   YRosalita Chessman DO  predniSONE (DELTASONE) 10 MG tablet Take 2 by mouth twice a day for 5 days 01/17/15   DJacqulyn Cane MD  promethazine-codeine ([facility] CODEINE) 6.25-10 MG/5ML syrup Take 1-2 teaspoons every 4-6 hours as needed for cough. May cause drowsiness. 01/17/15   DJacqulyn Cane MD  vitamin B-12 (CYANOCOBALAMIN) 100 MCG tablet Take 100 mcg by mouth daily.    Historical Provider, MD   BP 119/77 mmHg  Pulse 76  Temp(Src) 98.8 F (37.1 C) (Oral)  Resp 16  Ht _0  (1.803 m)  Wt 189 lb (85.73 kg)  BMI 26.37 kg/m2  SpO2 98% Physical Exam  Constitutional: She appears well-developed and well-nourished. No distress.  HENT:  Head: Normocephalic and atraumatic.  Right Ear: Hearing, tympanic membrane, external ear and ear canal normal.  Left Ear: Hearing, tympanic membrane, external ear and ear canal normal.  Nose: Mucosal edema present.  Mouth/Throat: Uvula is midline, oropharynx is clear and moist and mucous membranes are normal.  Eyes: Conjunctivae are normal. No scleral icterus.  Neck: Normal range of motion. Neck supple.  No nuchal rigidity or meningeal signs.  Cardiovascular: Normal rate, regular rhythm and normal heart sounds.   Pulmonary/Chest: Effort normal and breath sounds normal. No respiratory distress. She has no wheezes. She has no rales. She exhibits no tenderness.  No respiratory distress, able to speak in full sentences w/o difficulty. Lungs: CTAB  Abdominal: Soft. Bowel sounds are normal. She exhibits no distension and no mass. There is no tenderness. There is no rebound and no guarding.  Musculoskeletal: Normal range of motion.  Neurological: She is alert.  Skin: Skin is warm and dry. She is not diaphoretic.  Nursing note and vitals reviewed.   ED Course  Procedures  (including critical care time) Labs Review Labs Reviewed - No data to display  Imaging Review No results found.   MDM   1. Acute bronchitis, unspecified organism  Patient is a 59 year old female presenting to second time within 4 days for acute bronchitis.  Patient is are any on short course of prednisone as well as doxycycline.  Patient appears well, nontoxic is afebrile.  Lungs are clear.  No respiratory distress.  Reassured pt she is on the correct antibiotic.  Will add Mucinex DM to patient's home treatment.  Encourage plenty of fluids and rest.  Work note provided for 2 days.  Advised to follow-up with PCP on Friday as previously scheduled for a routine visit. Patient verbalized understanding and agreement with treatment plan.     Noland Fordyce, PA-C 01/21/15 [phone]

## 2015-01-21 NOTE — Discharge Instructions (Signed)
You may take 500mg  Tylenol every 4-6 hours as needed for fever and pain  Follow-up with your primary care provider next week for recheck of symptoms if not improving.  Be sure to drink plenty of fluids and rest, at least 8hrs of sleep a night, preferably more while you are sick. Please go to closest ER if you cannot keep down fluids/signs of dehydration, fever not reducing with Tylenol, difficulty breathing/wheezing, stiff neck, worsening condition, or other concerns (see below)   Acute Bronchitis Bronchitis is inflammation of the airways that extend from the windpipe into the lungs (bronchi). The inflammation often causes mucus to develop. This leads to a cough, which is the most common symptom of bronchitis.  In acute bronchitis, the condition usually develops suddenly and goes away over time, usually in a couple weeks. Smoking, allergies, and asthma can make bronchitis worse. Repeated episodes of bronchitis may cause further lung problems.  CAUSES Acute bronchitis is most often caused by the same virus that causes a cold. The virus can spread from person to person (contagious) through coughing, sneezing, and touching contaminated objects. SIGNS AND SYMPTOMS   Cough.   Fever.   Coughing up mucus.   Body aches.   Chest congestion.   Chills.   Shortness of breath.   Sore throat.  DIAGNOSIS  Acute bronchitis is usually diagnosed through a physical exam. Your health care provider will also ask you questions about your medical history. Tests, such as chest X-rays, are sometimes done to rule out other conditions.  TREATMENT  Acute bronchitis usually goes away in a couple weeks. Oftentimes, no medical treatment is necessary. Medicines are sometimes given for relief of fever or cough. Antibiotic medicines are usually not needed but may be prescribed in certain situations. In some cases, an inhaler may be recommended to help reduce shortness of breath and control the cough. A cool mist  vaporizer may also be used to help thin bronchial secretions and make it easier to clear the chest.  HOME CARE INSTRUCTIONS  Get plenty of rest.   Drink enough fluids to keep your urine clear or pale yellow (unless you have a medical condition that requires fluid restriction). Increasing fluids may help thin your respiratory secretions (sputum) and reduce chest congestion, and it will prevent dehydration.   Take medicines only as directed by your health care provider.  If you were prescribed an antibiotic medicine, finish it all even if you start to feel better.  Avoid smoking and secondhand smoke. Exposure to cigarette smoke or irritating chemicals will make bronchitis worse. If you are a smoker, consider using nicotine gum or skin patches to help control withdrawal symptoms. Quitting smoking will help your lungs heal faster.   Reduce the chances of another bout of acute bronchitis by washing your hands frequently, avoiding people with cold symptoms, and trying not to touch your hands to your mouth, nose, or eyes.   Keep all follow-up visits as directed by your health care provider.  SEEK MEDICAL CARE IF: Your symptoms do not improve after 1 week of treatment.  SEEK IMMEDIATE MEDICAL CARE IF:  You develop an increased fever or chills.   You have chest pain.   You have severe shortness of breath.  You have bloody sputum.   You develop dehydration.  You faint or repeatedly feel like you are going to pass out.  You develop repeated vomiting.  You develop a severe headache. MAKE SURE YOU:   Understand these instructions.  Will watch your  condition.  Will get help right away if you are not doing well or get worse. Document Released: 08/05/2004 Document Revised: 11/12/2013 Document Reviewed: 12/19/2012 [facility] Patient Information 2015 [place], [place]. This information is not intended to replace advice given to you by your health care provider. Make sure you discuss any  questions you have with your health care provider.

## 2015-01-23 ENCOUNTER — Ambulatory Visit: Payer: 59 | Admitting: Family Medicine

## 2015-01-24 ENCOUNTER — Encounter: Payer: Self-pay | Admitting: Family Medicine

## 2015-01-24 ENCOUNTER — Ambulatory Visit (INDEPENDENT_AMBULATORY_CARE_PROVIDER_SITE_OTHER): Payer: 59 | Admitting: Family Medicine

## 2015-01-24 VITALS — BP 120/72 | HR 65 | Temp 98.5°F | Ht 71.0 in | Wt 190.4 lb

## 2015-01-24 DIAGNOSIS — F329 Major depressive disorder, single episode, unspecified: Secondary | ICD-10-CM | POA: Diagnosis not present

## 2015-01-24 DIAGNOSIS — E785 Hyperlipidemia, unspecified: Secondary | ICD-10-CM | POA: Diagnosis not present

## 2015-01-24 DIAGNOSIS — I10 Essential (primary) hypertension: Secondary | ICD-10-CM | POA: Diagnosis not present

## 2015-01-24 DIAGNOSIS — M545 Low back pain, unspecified: Secondary | ICD-10-CM

## 2015-01-24 DIAGNOSIS — F32A Depression, unspecified: Secondary | ICD-10-CM

## 2015-01-24 DIAGNOSIS — E119 Type 2 diabetes mellitus without complications: Secondary | ICD-10-CM | POA: Diagnosis not present

## 2015-01-24 LAB — LIPID PANEL
Cholesterol: 157 mg/dL (ref 0–200)
HDL: 50 mg/dL (ref >39.00)
LDL Cholesterol: 91 mg/dL (ref 0–99)
NonHDL: 107
Total CHOL/HDL Ratio: 3
Triglycerides: 82 mg/dL (ref 0.0–149.0)
VLDL: 16.4 mg/dL (ref 0.0–40.0)

## 2015-01-24 LAB — HEPATIC FUNCTION PANEL
ALT: 16 U/L (ref 0–35)
AST: 17 U/L (ref 0–37)
Albumin: 4.1 g/dL (ref 3.5–5.2)
Alkaline Phosphatase: 63 U/L (ref 39–117)
Bilirubin, Direct: 0.1 mg/dL (ref 0.0–0.3)
Total Bilirubin: 0.6 mg/dL (ref 0.2–1.2)
Total Protein: 7.6 g/dL (ref 6.0–8.3)

## 2015-01-24 MED ORDER — CYCLOBENZAPRINE HCL 10 MG PO TABS: 10.0000 mg | ORAL_TABLET | Freq: Three times a day (TID) | ORAL | Status: DC | PRN

## 2015-01-24 MED ORDER — GLUCOSE BLOOD VI STRP: ORAL_STRIP | Status: DC

## 2015-01-24 MED ORDER — POTASSIUM CHLORIDE CRYS ER 10 MEQ PO TBCR: EXTENDED_RELEASE_TABLET | ORAL | Status: DC

## 2015-01-24 MED ORDER — OLMESARTAN MEDOXOMIL 5 MG PO TABS: 5.0000 mg | ORAL_TABLET | Freq: Every day | ORAL | Status: DC

## 2015-01-24 MED ORDER — CRESTOR 20 MG PO TABS: ORAL_TABLET | ORAL | Status: DC

## 2015-01-24 MED ORDER — FUROSEMIDE 20 MG PO TABS
ORAL_TABLET | ORAL | Status: DC
Start: 2015-01-24 — End: 2015-07-31

## 2015-01-24 MED ORDER — CITALOPRAM HYDROBROMIDE 20 MG PO TABS: 20.0000 mg | ORAL_TABLET | Freq: Every evening | ORAL | Status: DC

## 2015-01-24 NOTE — Progress Notes (Signed)
Pre visit review using our clinic review tool, if applicable. No additional management support is needed unless otherwise documented below in the visit note. 

## 2015-01-24 NOTE — Progress Notes (Signed)
Patient ID: [name], female    DOB: January 30, 1956  Age: 59 y.o. MRN: [id number]    Subjective:  Subjective HPI [name] presents for f/u bp , cholesterol and dm.   No complaints.  Pt is recovering from bronchitis .  No new complaints.   UC notes reviewed.   Review of Systems  Constitutional: Negative for diaphoresis, appetite change, fatigue and unexpected weight change.  Eyes: Negative for pain, redness and visual disturbance.  Respiratory: Negative for cough, chest tightness, shortness of breath and wheezing.   Cardiovascular: Negative for chest pain, palpitations and leg swelling.  Endocrine: Negative for cold intolerance, heat intolerance, polydipsia, polyphagia and polyuria.  Genitourinary: Negative for dysuria, frequency and difficulty urinating.  Neurological: Negative for dizziness, light-headedness, numbness and headaches.    History Past Medical History  Diagnosis Date  . Allergic rhinitis   . Menopause   . GERD (gastroesophageal reflux disease)   . IBS (irritable bowel syndrome)   . Anxiety   . Depression   . Hypertension   . Hyperlipemia   . Asthma     hx bronchial asthma at times with upper resp infection  . Headache(784.0)     migraine and cluster headaches  . Stress incontinence     at times  . Neuromuscular disorder 2009    hx of fibromyalgia, polyarthralsia (surgery induced)  . Diabetes mellitus without complication     She has past surgical history that includes Cesarean section ([place]); Cervical laminectomy (2005 & 2009); Foot surgery (2005); right knee (1981); Nasal sinus surgery ([place]); Laparoscopic gastric banding (12/30/09); Esophagogastroduodenoscopy (06/29/2011); Hysteroscopy (1999); laparoscopy (09/24/2011); Gastric banding port revision (09/24/2011); Tonsillectomy (1971); Abdominal hysterectomy (12/1999); Cholecystectomy (1986); Tubal ligation (1993); right knee arthroscopy and arthrotomy ([id number]);  Cervical conization w/bx (june 1990); laparoscopy (07/03/2012); Laparoscopic revision of gastric band (07/03/2012); Mesh applied to lap port (07/03/2012); Dilation and curettage of uterus ( april-1989); Laparoscopic repair and removal of gastric band (N/A, 07/02/2013); and Laparoscopic gastric sleeve resection (N/A, 07/02/2013).   Her family history includes Atrial fibrillation in her mother; Breast cancer in her mother; Cancer in her mother; Diabetes in her father and mother; Heart attack in her father; Heart disease in an other family member; Hypertension in her father and mother; Stroke in her father and mother; Thyroid disease in an other family member.She reports that she has never smoked. She has never used smokeless tobacco. She reports that she does not drink alcohol or use illicit drugs.  Current Outpatient Prescriptions on File Prior to Visit  Medication Sig Dispense Refill  . Blood Glucose Monitoring Suppl (BAYER CONTOUR LINK MONITOR) W/DEVICE KIT Test blood sugar once daily DX code: E11.9 1 kit 1  . Ca Phosphate-Cholecalciferol (CALTRATE GUMMY BITES PO) Take 2 tablets by mouth at bedtime.    Marland Kitchen dextromethorphan-guaiFENesin (MUCINEX DM) 30-600 MG per 12 hr tablet Take 1 tablet by mouth 2 (two) times daily. Drink a large glass of water with each dose 28 tablet 0  . doxycycline (VIBRAMYCIN) 100 MG capsule Take 1 capsule (100 mg total) by mouth 2 (two) times daily. 20 capsule 0  . Efinaconazole (JUBLIA) 10 % SOLN Apply 1 applicator topically every morning. 1 Bottle 3  . estradiol (VIVELLE-DOT) 0.1 MG/24HR Place 1 patch (0.1 mg total) onto the skin 2 (two) times a week. Monday and Thursday 8 patch 11  . glucose blood (ONETOUCH VERIO) test strip Check blood sugar  daily 100 each 12  . glucose blood test strip Use as instructed 100 each 12  . guaiFENesin-codeine 100-10 MG/5ML syrup Take 82m by mouth at bedtime as needed for cough 120 mL 0  . loratadine (CLARITIN) 10 MG tablet Take 10 mg by mouth  every evening.     .Glory RosebushDELICA LANCETS FINE MISC check blood sugar daily 100 each 12  . Pediatric Multivit-Minerals-C (ONE-A-DAY SCOOBY-DOO GUMMIES PO) Take 1 tablet by mouth daily.    . vitamin B-12 (CYANOCOBALAMIN) 100 MCG tablet Take 100 mcg by mouth daily.    . predniSONE (DELTASONE) 10 MG tablet Take 2 by mouth twice a day for 5 days (Patient not taking: Reported on 01/24/2015) 20 tablet 0   No current facility-administered medications on file prior to visit.     Objective:  Objective Physical Exam  Constitutional: She is oriented to person, place, and time. She appears well-developed and well-nourished.  HENT:  Head: Normocephalic and atraumatic.  Eyes: Conjunctivae and EOM are normal.  Neck: Normal range of motion. Neck supple. No JVD present. Carotid bruit is not present. No thyromegaly present.  Cardiovascular: Normal rate, regular rhythm and normal heart sounds.   No murmur heard. Pulmonary/Chest: Effort normal and breath sounds normal. No respiratory distress. She has no wheezes. She has no rales. She exhibits no tenderness.  Musculoskeletal: She exhibits no edema.  Neurological: She is alert and oriented to person, place, and time.  Psychiatric: She has a normal mood and affect. Her behavior is normal.   BP 120/72 mmHg  Pulse 65  Temp(Src) 98.5 F (36.9 C) (Oral)  Ht _0  (1.803 m)  Wt 190 lb 6.4 oz (86.365 kg)  BMI 26.57 kg/m2  SpO2 98% Wt Readings from Last 3 Encounters:  01/24/15 190 lb 6.4 oz (86.365 kg)  01/21/15 189 lb (85.73 kg)  12/09/14 195 lb (88.451 kg)     Lab Results  Component Value Date   WBC 10.6* 12/24/2013   HGB 13.8 12/24/2013   HCT 42.4 12/24/2013   PLT 253.0 12/24/2013   GLUCOSE 98 08/01/2014   CHOL 157 01/24/2015   TRIG 82.0 01/24/2015   HDL 50.00 01/24/2015   LDLCALC 91 01/24/2015   ALT 16 01/24/2015   AST 17 01/24/2015   NA 137 08/01/2014   K 4.6 08/01/2014   CL 101 08/01/2014   CREATININE 0.82 08/01/2014   BUN 16  08/01/2014   CO2 33* 08/01/2014   TSH 1.87 12/24/2013   HGBA1C 5.6 12/04/2014   MICROALBUR 0.1 08/01/2014    No results found.   Assessment & Plan:  Plan I have discontinued [name]'s promethazine-codeine. I am also having her maintain her loratadine, Ca Phosphate-Cholecalciferol (CALTRATE GUMMY BITES PO), Pediatric Multivit-Minerals-C (ONE-A-DAY SCOOBY-DOO GUMMIES PO), estradiol, vitamin B-12, glucose blood, glucose blood, ONETOUCH DELICA LANCETS FINE, BAYER CONTOUR LINK MONITOR, Efinaconazole, guaiFENesin-codeine, predniSONE, doxycycline, dextromethorphan-guaiFENesin, CRESTOR, citalopram, cyclobenzaprine, furosemide, glucose blood, olmesartan, and potassium chloride.  Meds ordered this encounter  Medications  . CRESTOR 20 MG tablet    Sig: TAKE 1 TABLET (20 MG TOTAL) BY MOUTH AT BEDTIME.    Dispense:  90 tablet    Refill:  1  . citalopram (CELEXA) 20 MG tablet    Sig: Take 1 tablet (20 mg total) by mouth every evening.    Dispense:  90 tablet    Refill:  3  . cyclobenzaprine (FLEXERIL) 10 MG tablet    Sig: Take 1 tablet (10 mg total) by mouth 3 (three) times daily  as needed for muscle spasms.    Dispense:  30 tablet    Refill:  0  . furosemide (LASIX) 20 MG tablet    Sig: TAKE 1 TABLET (20 MG TOTAL) BY MOUTH EVERY MORNING.    Dispense:  90 tablet    Refill:  1  . glucose blood test strip    Sig: Test blood sugar once daily. Dx Code: E11.9    Dispense:  100 each    Refill:  12  . olmesartan (BENICAR) 5 MG tablet    Sig: Take 1 tablet (5 mg total) by mouth daily.    Dispense:  90 tablet    Refill:  3  . potassium chloride (K-DUR,KLOR-CON) 10 MEQ tablet    Sig: TAKE 1 TABLET (10 MEQ TOTAL) BY MOUTH DAILY.    Dispense:  90 tablet    Refill:  1    Problem List Items Addressed This Visit    None    Visit Diagnoses    Hyperlipidemia    -  Primary    Relevant Medications    CRESTOR 20 MG tablet    furosemide (LASIX) 20 MG tablet    olmesartan (BENICAR) 5 MG tablet      Other Relevant Orders    Hepatic function panel (Completed)    Lipid panel (Completed)    Essential hypertension        Relevant Medications    CRESTOR 20 MG tablet    furosemide (LASIX) 20 MG tablet    olmesartan (BENICAR) 5 MG tablet    potassium chloride (K-DUR,KLOR-CON) 10 MEQ tablet    Diabetes mellitus type II, controlled        Relevant Medications    CRESTOR 20 MG tablet    glucose blood test strip    olmesartan (BENICAR) 5 MG tablet    Depression        Relevant Medications    citalopram (CELEXA) 20 MG tablet    Back pain at L4-L5 level        Relevant Medications    cyclobenzaprine (FLEXERIL) 10 MG tablet       Follow-up: Return in about 6 months (around 07/27/2015), or if symptoms worsen or fail to improve, for hypertension, hyperlipidemia, diabetes II.  [name], DO

## 2015-01-24 NOTE — Patient Instructions (Signed)

## 2015-02-27 ENCOUNTER — Encounter: Payer: 59 | Attending: General Surgery | Admitting: Dietician

## 2015-02-27 ENCOUNTER — Encounter: Payer: Self-pay | Admitting: Dietician

## 2015-02-27 DIAGNOSIS — Z6826 Body mass index (BMI) 26.0-26.9, adult: Secondary | ICD-10-CM | POA: Diagnosis not present

## 2015-02-27 DIAGNOSIS — Z713 Dietary counseling and surveillance: Secondary | ICD-10-CM | POA: Insufficient documentation

## 2015-02-27 NOTE — Progress Notes (Signed)
Follow-up visit:  20 Months Post-Operative Gastric Sleeve Surgery  Medical Nutrition Therapy:  Appt start time: 0810 end time:  0825.  Primary concerns today: Post-operative Bariatric Surgery Nutrition Management. Mikailah returns today with a 1.5 lb weight since last visit. Hurt her knee a week ago which has been swelling. Toenail is doing better. Has not been able to exercise as much recently. Feels like she is doing great.   Surgery date: 07/03/12 Surgery type: LAGB Revision Start weight at [facility]: 262.6 lbs  Surgery date: 07/02/2013 Surgery type: Gastric Sleeve Weight today: 190.5 lbs  Weight change: 1.5 lbs weight loss Total weight lost:  70 lbs  Weight goal: 180 lbs % goal met: 87%  TANITA  BODY COMP RESULTS  09/15/11 01/31/12 05/08/12 08/30/12 11/06/12 01/04/13 01/24/13 03/23/13 06/21/13 07/17/13 09/03/13 10/18/13 01/23/14 04/29/14 08/29/14 02/27/15   BMI (kg/m^2) 30.3 31.4 32.6 31.0 32.1 31.5 31.9 31.7 32.8 30.3 29.3 28.1 27.8 26.6 26.8 26.6   FM (lbs) 100.5 105.5 111.0 105.0 110.5 106.5 109.0 112.0 116.5 104.5 95.5 89.5 84.0 83.5 82.0 84.0   FFM (lbs) 116.0 120.0 123.0 117.5 119.5 119.5 120.0 115.0 118.5 113.0 114.5 112.0 115.0 107.0 110.0 106.5   TBW (lbs) 85.0 88.0 90.0 86.0 87.5 87.5 88.0 84.0 87.0 82.5 84.0 82.0 84.0 78.5 80.5 78.0    Preferred Learning Style:   No preference indicated   Learning Readiness:   Ready  24-hr recall: B (AM): Protein Shake (atkins)  Or egg with cheese (12-15 g) Snk (AM): Dannon Light and Fit (12 g)  L (PM): 3 oz grilled chicken and salad and green beans or squash or ground beef and cheese  (21 g) Snk (PM): [place] and cheese sometimes has a starch (14 g) D PM): 3 oz chicken or flounder with collards (21 g) Snk (PM): none  Fluid intake:64 oz mostly water or crystal light Estimated total protein intake: 70-83 g   Medications: no longer taking Victoza, blood pressure medication "cut in half" Supplementation: taking  CBG monitoring: 2-3 x day   Average CBG per patient: 80-90 mg/dl fasting Last patient reported A1c: 5.6% in May (per patient)  Using straws: No Drinking while eating: No Hair loss: No Carbonated beverages: No N/V/D/C: Has some some loose stools after protein shakes (not as much). Dumping syndrome: No  Recent physical activity: walking 3x week for 30-40 minutes (not in past week)  Progress Towards Goal(s):  In progress.  Nutritional Diagnosis:  [place]-3.3 Overweight/obesity related to past poor dietary habits and physical inactivity as evidenced by patient w/ recent Sleeve Gastrectomy surgery following dietary guidelines for continued weight loss.    Intervention:  Nutrition education/diet reinforcment. Plan to continue with Phase 3B High Protein + Non-Starchy Vegetable diet and increase physical activity when work schedule permits.   Goals:  Follow Phase 3B: High Protein + Non-Starchy Vegetables  Eat 3-6 small meals/snacks, every 3-5 hrs  Increase lean protein foods to meet 60g goal  Increase fluid intake to 64oz +  Avoid drinking 15 minutes before, during and 30 minutes after eating  Resume exercise when knee heals  Continue having protein foods first, then non starchy vegetables, and then add some carbs if you are still hungry   Teaching Method Utilized:  Visual Auditory Hands on  Barriers to learning/adherence to lifestyle change: none  Demonstrated degree of understanding via:  Teach Back   Monitoring/Evaluation:  Dietary intake, exercise, and body weight. Follow up in 6 months for 26 month post-op visit.

## 2015-02-27 NOTE — Patient Instructions (Addendum)
Goals:  Follow Phase 3B: High Protein + Non-Starchy Vegetables  Eat 3-6 small meals/snacks, every 3-5 hrs  Increase lean protein foods to meet 60g goal  Increase fluid intake to 64oz +  Avoid drinking 15 minutes before, during and 30 minutes after eating  Resume exercise when knee heals  Continue having protein foods first, then non starchy vegetables, and then add some carbs if you are still hungry  Surgery date: 07/03/12 Surgery type: LAGB Revision Start weight at [facility]: 262.6 lbs  Surgery date: 07/02/2013 Surgery type: Gastric Sleeve Weight today: 190.5 lbs  Weight change: 1.5 lbs weight loss Total weight lost:  70 lbs  Weight goal: 180 lbs % goal met: 87%  TANITA  BODY COMP RESULTS  09/15/11 01/31/12 05/08/12 08/30/12 11/06/12 01/04/13 01/24/13 03/23/13 06/21/13 07/17/13 09/03/13 10/18/13 01/23/14 04/29/14 08/29/14 02/27/15   BMI (kg/m^2) 30.3 31.4 32.6 31.0 32.1 31.5 31.9 31.7 32.8 30.3 29.3 28.1 27.8 26.6 26.8 26.6   FM (lbs) 100.5 105.5 111.0 105.0 110.5 106.5 109.0 112.0 116.5 104.5 95.5 89.5 84.0 83.5 82.0 84.0   FFM (lbs) 116.0 120.0 123.0 117.5 119.5 119.5 120.0 115.0 118.5 113.0 114.5 112.0 115.0 107.0 110.0 106.5   TBW (lbs) 85.0 88.0 90.0 86.0 87.5 87.5 88.0 84.0 87.0 82.5 84.0 82.0 84.0 78.5 80.5 78.0

## 2015-03-05 ENCOUNTER — Encounter: Payer: Self-pay | Admitting: Podiatry

## 2015-03-05 ENCOUNTER — Ambulatory Visit (INDEPENDENT_AMBULATORY_CARE_PROVIDER_SITE_OTHER): Payer: 59 | Admitting: Podiatry

## 2015-03-05 VITALS — BP 123/70 | HR 63

## 2015-03-05 DIAGNOSIS — M79606 Pain in leg, unspecified: Secondary | ICD-10-CM | POA: Diagnosis not present

## 2015-03-05 DIAGNOSIS — B351 Tinea unguium: Secondary | ICD-10-CM

## 2015-03-05 NOTE — Patient Instructions (Signed)
Seen for hypertrophic nails. Thick ingrown nails. No acute problems.  All nails debrided. Return in 3 months or as needed.

## 2015-03-05 NOTE — Progress Notes (Signed)
Subjective:  59 year old female presents requesting 2nd toe on right to be checked before she goes to beach next week. She has had infected fissure on her previous beach trip.   Objective:  Contracted digit 2nd right with distal callus with history of skin break down.  All nails appear normal. Neurovascular status are within norma.   Assessment: Contracted digit 2nd with keratotic skin build up at distal end right. Pain in lower limbs.   Plan:  Reviewed findings. Debrided keratotic lesion and nail 2nd digit right.  Return as needed.

## 2015-03-07 ENCOUNTER — Ambulatory Visit: Payer: 59 | Admitting: Podiatry

## 2015-04-18 ENCOUNTER — Telehealth: Payer: Self-pay | Admitting: Family Medicine

## 2015-04-18 DIAGNOSIS — E785 Hyperlipidemia, unspecified: Secondary | ICD-10-CM

## 2015-04-18 NOTE — Telephone Encounter (Signed)
[place] [phone]  Pt came in with RX for Crestor. It is written for name brand. Pt can get generic for free with ins. Please call to notify if generic is ok to use.

## 2015-04-18 NOTE — Telephone Encounter (Signed)
Ok to change

## 2015-04-18 NOTE — Telephone Encounter (Addendum)
Ok to change

## 2015-04-21 MED ORDER — ROSUVASTATIN CALCIUM 20 MG PO TABS: ORAL_TABLET | ORAL | Status: DC

## 2015-04-21 NOTE — Telephone Encounter (Signed)
Rx faxed.    KP 

## 2015-05-14 ENCOUNTER — Other Ambulatory Visit (HOSPITAL_BASED_OUTPATIENT_CLINIC_OR_DEPARTMENT_OTHER): Payer: Self-pay | Admitting: Obstetrics and Gynecology

## 2015-05-14 DIAGNOSIS — Z1231 Encounter for screening mammogram for malignant neoplasm of breast: Secondary | ICD-10-CM

## 2015-05-28 ENCOUNTER — Ambulatory Visit (INDEPENDENT_AMBULATORY_CARE_PROVIDER_SITE_OTHER): Payer: 59 | Admitting: Podiatry

## 2015-05-28 ENCOUNTER — Encounter: Payer: Self-pay | Admitting: Podiatry

## 2015-05-28 VITALS — BP 126/71 | HR 65

## 2015-05-28 DIAGNOSIS — L600 Ingrowing nail: Secondary | ICD-10-CM | POA: Diagnosis not present

## 2015-05-28 DIAGNOSIS — B351 Tinea unguium: Secondary | ICD-10-CM

## 2015-05-28 DIAGNOSIS — M79604 Pain in right leg: Secondary | ICD-10-CM

## 2015-05-28 DIAGNOSIS — M79671 Pain in right foot: Secondary | ICD-10-CM

## 2015-05-28 DIAGNOSIS — M79606 Pain in leg, unspecified: Secondary | ICD-10-CM

## 2015-05-28 NOTE — Progress Notes (Signed)
Subjective:  59 year old female presents complaining of painful nail 2nd right. She has pulled a piece of nail and made it worse.  Objective:  Ingrown nail with damaged skin medial border 2nd digit right. All nails are thick, hard, and hypertrophic.  Neurovascular status are within norma.   Assessment: Ingrown nail 2nd right medial. Onychomycosis x 10. Pain in lower limbs.   Plan:  Reviewed findings. Removed ingrown nail and betadine dressing placed. All nails debrided. Return as needed.

## 2015-05-28 NOTE — Patient Instructions (Signed)
Seen for painful ingrown nail. All nails debrided. Soak in [place] salt water affected foot daily till tenderness subside.  Return in 10 weeks.

## 2015-05-29 ENCOUNTER — Ambulatory Visit (INDEPENDENT_AMBULATORY_CARE_PROVIDER_SITE_OTHER): Payer: Self-pay | Admitting: Family Medicine

## 2015-05-29 ENCOUNTER — Encounter: Payer: Self-pay | Admitting: Pharmacist

## 2015-05-29 VITALS — BP 138/68 | HR 66 | Ht 71.0 in | Wt 193.6 lb

## 2015-05-29 DIAGNOSIS — E119 Type 2 diabetes mellitus without complications: Secondary | ICD-10-CM

## 2015-05-29 NOTE — Progress Notes (Signed)
Subjective:  Patient presents today for 3 month diabetes follow-up as part of the employer-sponsored Link to Wellness program.  Current diabetes regimen includes only monitoring. Patient continues on daily ARB and statin. No med changes or major health changes at this time.    Assessment:  Diabetes: Most recent A1C was 5.6% which is at goal of less than 7%. Weight is stable from last visit with me.   Lifestyle improvements:  Physical Activity-  Exercises 30-45 minutes 3-4 days per week.  Nutrition- Still well controlled on same diet as previous.  Follow up with me in 3 months.    Plan/Goals for Next Visit:  1. Keep it up. Wonderful to see the continued successful maintenance.

## 2015-06-09 NOTE — Progress Notes (Signed)
I have reviewed this pharmacist's note and agree  

## 2015-06-10 ENCOUNTER — Ambulatory Visit (HOSPITAL_BASED_OUTPATIENT_CLINIC_OR_DEPARTMENT_OTHER)
Admission: RE | Admit: 2015-06-10 | Discharge: 2015-06-10 | Disposition: A | Discharge status: Home / Self Care | Payer: 59 | Source: Ambulatory Visit | Attending: Obstetrics and Gynecology | Admitting: Obstetrics and Gynecology

## 2015-06-10 DIAGNOSIS — Z1231 Encounter for screening mammogram for malignant neoplasm of breast: Secondary | ICD-10-CM | POA: Diagnosis not present

## 2015-07-13 DIAGNOSIS — K912 Postsurgical malabsorption, not elsewhere classified: Secondary | ICD-10-CM | POA: Diagnosis not present

## 2015-07-31 ENCOUNTER — Encounter: Payer: Self-pay | Admitting: Family Medicine

## 2015-07-31 ENCOUNTER — Ambulatory Visit (INDEPENDENT_AMBULATORY_CARE_PROVIDER_SITE_OTHER): Payer: 59 | Admitting: Family Medicine

## 2015-07-31 VITALS — BP 118/64 | HR 61 | Temp 98.4°F | Ht 71.0 in | Wt 194.8 lb

## 2015-07-31 DIAGNOSIS — F329 Major depressive disorder, single episode, unspecified: Secondary | ICD-10-CM

## 2015-07-31 DIAGNOSIS — E119 Type 2 diabetes mellitus without complications: Secondary | ICD-10-CM

## 2015-07-31 DIAGNOSIS — N9419 Other specified dyspareunia: Secondary | ICD-10-CM | POA: Diagnosis not present

## 2015-07-31 DIAGNOSIS — Z1159 Encounter for screening for other viral diseases: Secondary | ICD-10-CM

## 2015-07-31 DIAGNOSIS — E785 Hyperlipidemia, unspecified: Secondary | ICD-10-CM

## 2015-07-31 DIAGNOSIS — I10 Essential (primary) hypertension: Secondary | ICD-10-CM

## 2015-07-31 DIAGNOSIS — Z01411 Encounter for gynecological examination (general) (routine) with abnormal findings: Secondary | ICD-10-CM | POA: Diagnosis not present

## 2015-07-31 DIAGNOSIS — Z6828 Body mass index (BMI) 28.0-28.9, adult: Secondary | ICD-10-CM | POA: Diagnosis not present

## 2015-07-31 DIAGNOSIS — M545 Low back pain, unspecified: Secondary | ICD-10-CM

## 2015-07-31 DIAGNOSIS — F32A Depression, unspecified: Secondary | ICD-10-CM

## 2015-07-31 LAB — CBC WITH DIFFERENTIAL/PLATELET
Basophils Absolute: 0.1 10*3/uL (ref 0.0–0.1)
Basophils Relative: 0.9 % (ref 0.0–3.0)
Eosinophils Absolute: 0.2 10*3/uL (ref 0.0–0.7)
Eosinophils Relative: 2.5 % (ref 0.0–5.0)
HCT: 40.3 % (ref 36.0–46.0)
Hemoglobin: 13.3 g/dL (ref 12.0–15.0)
Lymphocytes Relative: 27.5 % (ref 12.0–46.0)
Lymphs Abs: 1.9 10*3/uL (ref 0.7–4.0)
MCHC: 33 g/dL (ref 30.0–36.0)
MCV: 85.2 fl (ref 78.0–100.0)
Monocytes Absolute: 0.6 10*3/uL (ref 0.1–1.0)
Monocytes Relative: 8.4 % (ref 3.0–12.0)
Neutro Abs: 4.1 10*3/uL (ref 1.4–7.7)
Neutrophils Relative %: 60.7 % (ref 43.0–77.0)
Platelets: 262 10*3/uL (ref 150.0–400.0)
RBC: 4.74 Mil/uL (ref 3.87–5.11)
RDW: 13.5 % (ref 11.5–15.5)
WBC: 6.8 10*3/uL (ref 4.0–10.5)

## 2015-07-31 LAB — POCT URINALYSIS DIPSTICK
Bilirubin, UA: NEGATIVE
Blood, UA: NEGATIVE
Glucose, UA: NEGATIVE
Ketones, UA: NEGATIVE
Leukocytes, UA: NEGATIVE
Nitrite, UA: NEGATIVE
Protein, UA: NEGATIVE
Spec Grav, UA: 1.015
Urobilinogen, UA: 0.2
pH, UA: 6.5

## 2015-07-31 LAB — COMPREHENSIVE METABOLIC PANEL
ALT: 14 U/L (ref 0–35)
AST: 15 U/L (ref 0–37)
Albumin: 4.3 g/dL (ref 3.5–5.2)
Alkaline Phosphatase: 60 U/L (ref 39–117)
BUN: 16 mg/dL (ref 6–23)
CO2: 33 mEq/L — ABNORMAL HIGH (ref 19–32)
Calcium: 9.7 mg/dL (ref 8.4–10.5)
Chloride: 100 mEq/L (ref 96–112)
Creatinine, Ser: 0.9 mg/dL (ref 0.40–1.20)
GFR: 68.21 mL/min (ref >60.00)
Glucose, Bld: 95 mg/dL (ref 70–99)
Potassium: 4.2 mEq/L (ref 3.5–5.1)
Sodium: 139 mEq/L (ref 135–145)
Total Bilirubin: 0.6 mg/dL (ref 0.2–1.2)
Total Protein: 7.7 g/dL (ref 6.0–8.3)

## 2015-07-31 LAB — LIPID PANEL
Cholesterol: 123 mg/dL (ref 0–200)
HDL: 55 mg/dL (ref >39.00)
LDL Cholesterol: 56 mg/dL (ref 0–99)
NonHDL: 68.2
Total CHOL/HDL Ratio: 2
Triglycerides: 63 mg/dL (ref 0.0–149.0)
VLDL: 12.6 mg/dL (ref 0.0–40.0)

## 2015-07-31 LAB — HEMOGLOBIN A1C: Hgb A1c MFr Bld: 5.8 % (ref 4.6–6.5)

## 2015-07-31 LAB — HEPATITIS C ANTIBODY: HCV Ab: NEGATIVE

## 2015-07-31 MED ORDER — CITALOPRAM HYDROBROMIDE 20 MG PO TABS: 20.0000 mg | ORAL_TABLET | Freq: Every evening | ORAL | Status: DC

## 2015-07-31 MED ORDER — POTASSIUM CHLORIDE CRYS ER 10 MEQ PO TBCR: EXTENDED_RELEASE_TABLET | ORAL | Status: DC

## 2015-07-31 MED ORDER — CYCLOBENZAPRINE HCL 10 MG PO TABS: 10.0000 mg | ORAL_TABLET | Freq: Three times a day (TID) | ORAL | Status: DC | PRN

## 2015-07-31 MED ORDER — OLMESARTAN MEDOXOMIL 5 MG PO TABS: 5.0000 mg | ORAL_TABLET | Freq: Every day | ORAL | Status: DC

## 2015-07-31 MED ORDER — GLUCOSE BLOOD VI STRP: ORAL_STRIP | Status: DC

## 2015-07-31 MED ORDER — ROSUVASTATIN CALCIUM 20 MG PO TABS: ORAL_TABLET | ORAL | Status: DC

## 2015-07-31 MED ORDER — FUROSEMIDE 20 MG PO TABS: ORAL_TABLET | ORAL | Status: DC

## 2015-07-31 MED FILL — CITALOPRAM HBR 20 MG TABLET: 20 | 90 days supply | Qty: 90 | Fill #0

## 2015-07-31 MED FILL — POTASSIUM CL 10 MEQ TAB SA: 10 | 90 days supply | Qty: 90 | Fill #0

## 2015-07-31 MED FILL — CYCLOBENZAPRINE 10 MG TAB: 10 | 10 days supply | Qty: 30 | Fill #0

## 2015-07-31 MED FILL — FUROSEMIDE 20 MG TABLET: 20 | 90 days supply | Qty: 90 | Fill #0

## 2015-07-31 MED FILL — ACCU-CHEK AVIVA PLUS TEST S: 90 days supply | Qty: 100 | Fill #0

## 2015-07-31 MED FILL — ROSUVASTATIN CALCIUM 20 MG: 20 | 90 days supply | Qty: 90 | Fill #0

## 2015-07-31 MED FILL — BENICAR 5 MG TABLET: 5 | 90 days supply | Qty: 90 | Fill #0

## 2015-07-31 NOTE — Assessment & Plan Note (Signed)
con't crestor Check labs 

## 2015-07-31 NOTE — Assessment & Plan Note (Signed)
con't benicar stable

## 2015-07-31 NOTE — Progress Notes (Signed)
Patient ID: [name], female    DOB: 08/15/1955  Age: 60 y.o. MRN: [id number]    Subjective:  Subjective HPI [name] presents for f/u dm , cholesterol and htn.   No complaints.    HYPERTENSION  Blood pressure range--- not checking  Chest pain- no      Dyspnea- no Lightheadedness- no   Edema- no Other side effects - no   Medication compliance: good Low salt diet- yes  DIABETES  Blood Sugar ranges-90s  Polyuria- no New Visual problems- no Hypoglycemic symptoms- no Other side effects-no Medication compliance - good Last eye exam- 02/2015 Foot exam- today  HYPERLIPIDEMIA  Medication compliance- good RUQ pain- no  Muscle aches- no Other side effects-no  Review of Systems  Constitutional: Negative for diaphoresis, appetite change, fatigue and unexpected weight change.  Eyes: Negative for pain, redness and visual disturbance.  Respiratory: Negative for cough, chest tightness, shortness of breath and wheezing.   Cardiovascular: Negative for chest pain, palpitations and leg swelling.  Endocrine: Negative for cold intolerance, heat intolerance, polydipsia, polyphagia and polyuria.  Genitourinary: Negative for dysuria, frequency and difficulty urinating.  Neurological: Negative for dizziness, light-headedness, numbness and headaches.    History Past Medical History  Diagnosis Date  . Allergic rhinitis   . Menopause   . GERD (gastroesophageal reflux disease)   . IBS (irritable bowel syndrome)   . Anxiety   . Depression   . Hypertension   . Hyperlipemia   . Asthma     hx bronchial asthma at times with upper resp infection  . Headache(784.0)     migraine and cluster headaches  . Stress incontinence     at times  . Neuromuscular disorder ([place]) 2009    hx of fibromyalgia, polyarthralsia (surgery induced)  . Diabetes mellitus without complication ([place])     She has past surgical history that includes Cesarean section ([place]); Cervical laminectomy  (2005 & 2009); Foot surgery (2005); right knee (1981); Nasal sinus surgery ([place]); Laparoscopic gastric banding (12/30/09); Esophagogastroduodenoscopy (06/29/2011); Hysteroscopy (1999); laparoscopy (09/24/2011); Gastric banding port revision (09/24/2011); Tonsillectomy (1971); Abdominal hysterectomy (12/1999); Cholecystectomy (1986); Tubal ligation (1993); right knee arthroscopy and arthrotomy ([phone]); Cervical conization w/bx (june 1990); laparoscopy (07/03/2012); Laparoscopic revision of gastric band (07/03/2012); Mesh applied to lap port (07/03/2012); Dilation and curettage of uterus ( april-1989); Laparoscopic repair and removal of gastric band (N/A, 07/02/2013); and Laparoscopic gastric sleeve resection (N/A, 07/02/2013).   Her family history includes Atrial fibrillation in her mother; Breast cancer in her mother; Cancer in her mother; Diabetes in her father and mother; Heart attack in her father; Hypertension in her father and mother; Stroke in her father and mother.She reports that she has never smoked. She has never used smokeless tobacco. She reports that she does not drink alcohol or use illicit drugs.  Current Outpatient Prescriptions on File Prior to Visit  Medication Sig Dispense Refill  . Blood Glucose Monitoring Suppl (BAYER CONTOUR LINK MONITOR) W/DEVICE KIT Test blood sugar once daily DX code: E11.9 1 kit 1  . Ca Phosphate-Cholecalciferol (CALTRATE GUMMY BITES PO) Take 2 tablets by mouth at bedtime.    . citalopram (CELEXA) 20 MG tablet Take 1 tablet (20 mg total) by mouth every evening. 90 tablet 3  . cyclobenzaprine (FLEXERIL) 10 MG tablet Take 1 tablet (10 mg total) by mouth 3 (three) times daily as needed for muscle spasms. 30 tablet 0  . Efinaconazole (JUBLIA) 10 % SOLN Apply 1 applicator topically every morning.  1 Bottle 3  . estradiol (VIVELLE-DOT) 0.1 MG/24HR Place 1 patch (0.1 mg total) onto the skin 2 (two) times a week. Monday and Thursday 8 patch 11  . furosemide (LASIX)  20 MG tablet TAKE 1 TABLET (20 MG TOTAL) BY MOUTH EVERY MORNING. 90 tablet 1  . glucose blood (ONETOUCH VERIO) test strip Check blood sugar daily 100 each 12  . loratadine (CLARITIN) 10 MG tablet Take 10 mg by mouth every evening.     . olmesartan (BENICAR) 5 MG tablet Take 1 tablet (5 mg total) by mouth daily. 90 tablet 3  . ONETOUCH DELICA LANCETS FINE MISC check blood sugar daily 100 each 12  . Pediatric Multivit-Minerals-C (ONE-A-DAY SCOOBY-DOO GUMMIES PO) Take 1 tablet by mouth daily.    . potassium chloride (K-DUR,KLOR-CON) 10 MEQ tablet TAKE 1 TABLET (10 MEQ TOTAL) BY MOUTH DAILY. 90 tablet 1  . rosuvastatin (CRESTOR) 20 MG tablet TAKE 1 TABLET (20 MG TOTAL) BY MOUTH AT BEDTIME. 90 tablet 1  . vitamin B-12 (CYANOCOBALAMIN) 100 MCG tablet Take 100 mcg by mouth daily.     No current facility-administered medications on file prior to visit.     Objective:  Objective Physical Exam  Constitutional: She is oriented to person, place, and time. She appears well-developed and well-nourished.  HENT:  Head: Normocephalic and atraumatic.  Eyes: Conjunctivae and EOM are normal.  Neck: Normal range of motion. Neck supple. No JVD present. Carotid bruit is not present. No thyromegaly present.  Cardiovascular: Normal rate, regular rhythm and normal heart sounds.   No murmur heard. Pulmonary/Chest: Effort normal and breath sounds normal. No respiratory distress. She has no wheezes. She has no rales. She exhibits no tenderness.  Musculoskeletal: She exhibits no edema.  Neurological: She is alert and oriented to person, place, and time.  Psychiatric: She has a normal mood and affect. Her behavior is normal. Judgment and thought content normal.  Nursing note and vitals reviewed. Sensory exam of the foot is normal, tested with the monofilament. Good pulses, no lesions or ulcers, good peripheral pulses.  BP 118/64 mmHg  Pulse 61  Temp(Src) 98.4 F (36.9 C) (Oral)  Ht 5\' 11"  (1.803 m)  Wt 194 lb  12.8 oz (88.361 kg)  BMI 27.18 kg/m2  SpO2 98% Wt Readings from Last 3 Encounters:  07/31/15 194 lb 12.8 oz (88.361 kg)  05/29/15 193 lb 9.6 oz (87.816 kg)  02/27/15 190 lb 8 oz (86.41 kg)     Lab Results  Component Value Date   WBC 10.6* 12/24/2013   HGB 13.8 12/24/2013   HCT 42.4 12/24/2013   PLT 253.0 12/24/2013   GLUCOSE 98 08/01/2014   CHOL 157 01/24/2015   TRIG 82.0 01/24/2015   HDL 50.00 01/24/2015   LDLCALC 91 01/24/2015   ALT 16 01/24/2015   AST 17 01/24/2015   NA 137 08/01/2014   K 4.6 08/01/2014   CL 101 08/01/2014   CREATININE 0.82 08/01/2014   BUN 16 08/01/2014   CO2 33* 08/01/2014   TSH 1.87 12/24/2013   HGBA1C 5.6 12/04/2014   MICROALBUR 0.1 08/01/2014    Mm Screening Breast Tomo Bilateral  06/12/2015  CLINICAL DATA:  Screening. EXAM: DIGITAL SCREENING BILATERAL MAMMOGRAM WITH 3D TOMO WITH CAD COMPARISON:  Previous exam(s). ACR Breast Density Category c: The breast tissue is heterogeneously dense, which may obscure small masses. FINDINGS: There are no findings suspicious for malignancy. Images were processed with CAD. IMPRESSION: No mammographic evidence of malignancy. A result letter of this screening  mammogram will be mailed directly to the patient. RECOMMENDATION: Screening mammogram in one year. (Code:SM-B-01Y) BI-RADS CATEGORY  1: Negative. Electronically Signed   By: Claudie Revering M.D.   On: 06/12/2015 10:45     Assessment & Plan:  Plan I am having Ms. Glomski maintain her loratadine, Ca Phosphate-Cholecalciferol (CALTRATE GUMMY BITES PO), Pediatric Multivit-Minerals-C (ONE-A-DAY SCOOBY-DOO GUMMIES PO), estradiol, vitamin B-12, glucose blood, ONETOUCH DELICA LANCETS FINE, BAYER CONTOUR LINK MONITOR, Efinaconazole, citalopram, cyclobenzaprine, furosemide, olmesartan, potassium chloride, and rosuvastatin.  No orders of the defined types were placed in this encounter.    Problem List Items Addressed This Visit    None    Visit Diagnoses    Depression     -  Primary    Back pain at L4-L5 level        Essential hypertension        Hyperlipidemia           Follow-up: No Follow-up on file.  [name], DO

## 2015-07-31 NOTE — Patient Instructions (Signed)

## 2015-07-31 NOTE — Addendum Note (Signed)
Addended by: [name] on: 07/31/2015 09:13 AM   Modules accepted: Orders

## 2015-07-31 NOTE — Addendum Note (Signed)
Addended by: [name] on: 07/31/2015 10:12 AM   Modules accepted: Orders

## 2015-07-31 NOTE — Progress Notes (Signed)
Pre visit review using our clinic review tool, if applicable. No additional management support is needed unless otherwise documented below in the visit note. 

## 2015-07-31 NOTE — Assessment & Plan Note (Signed)
Diet controlled. Check labs.  

## 2015-08-01 MED FILL — ESTRADIOL 0.1 MG PATCH: 0.1 | 84 days supply | Qty: 24 | Fill #0

## 2015-08-01 MED FILL — PREMARIN VAGINAL CREAM-APPL: 0.625 | 30 days supply | Qty: 30 | Fill #0

## 2015-08-06 ENCOUNTER — Ambulatory Visit (INDEPENDENT_AMBULATORY_CARE_PROVIDER_SITE_OTHER): Payer: 59 | Admitting: Podiatry

## 2015-08-06 ENCOUNTER — Encounter: Payer: Self-pay | Admitting: Podiatry

## 2015-08-06 VITALS — BP 141/70 | HR 57

## 2015-08-06 DIAGNOSIS — M79606 Pain in leg, unspecified: Secondary | ICD-10-CM | POA: Diagnosis not present

## 2015-08-06 DIAGNOSIS — B351 Tinea unguium: Secondary | ICD-10-CM

## 2015-08-06 NOTE — Progress Notes (Signed)
Subjective:  60 year old female presents requesting toe nails trimmed.   Objective:  All nails are thick, hard, and hypertrophic.  Neurovascular status are within norma.   Assessment: Onychomycosis x 10. Pain in lower limbs.   Plan:  Reviewed findings. All nails debrided. Return as needed.

## 2015-08-06 NOTE — Patient Instructions (Signed)
Seen for hypertrophic nails. All nails debrided. Return in 3 months or as needed.  

## 2015-08-13 DIAGNOSIS — K912 Postsurgical malabsorption, not elsewhere classified: Secondary | ICD-10-CM | POA: Diagnosis not present

## 2015-09-01 ENCOUNTER — Ambulatory Visit: Payer: 59 | Admitting: Dietician

## 2015-09-11 DIAGNOSIS — K912 Postsurgical malabsorption, not elsewhere classified: Secondary | ICD-10-CM | POA: Diagnosis not present

## 2015-10-10 ENCOUNTER — Ambulatory Visit: Payer: 59 | Admitting: Dietician

## 2015-10-11 DIAGNOSIS — K912 Postsurgical malabsorption, not elsewhere classified: Secondary | ICD-10-CM | POA: Diagnosis not present

## 2015-11-04 ENCOUNTER — Ambulatory Visit: Payer: 59 | Admitting: Podiatry

## 2015-11-06 ENCOUNTER — Encounter: Payer: Self-pay | Admitting: Podiatry

## 2015-11-06 ENCOUNTER — Ambulatory Visit (INDEPENDENT_AMBULATORY_CARE_PROVIDER_SITE_OTHER): Payer: 59 | Admitting: Podiatry

## 2015-11-06 VITALS — BP 141/76 | HR 62

## 2015-11-06 DIAGNOSIS — M2040 Other hammer toe(s) (acquired), unspecified foot: Secondary | ICD-10-CM | POA: Diagnosis not present

## 2015-11-06 DIAGNOSIS — R234 Changes in skin texture: Secondary | ICD-10-CM | POA: Diagnosis not present

## 2015-11-06 DIAGNOSIS — M79606 Pain in leg, unspecified: Secondary | ICD-10-CM | POA: Diagnosis not present

## 2015-11-06 DIAGNOSIS — B351 Tinea unguium: Secondary | ICD-10-CM | POA: Diagnosis not present

## 2015-11-06 NOTE — Patient Instructions (Signed)
Seen for hypertrophic nails. All nails debrided. Return in 3 months or as needed.  

## 2015-11-06 NOTE — Progress Notes (Signed)
Subjective:  60 year old female presents complaining of toe nails turning to dark color, thick and problematic nails on both great toes, and requesting toe nails trimmed.   Objective:  All nails are thick, hard, and hypertrophic.  Discolorated nail plate 2nd bilateral.  Contracted 2nd digit bilateral.  Neurovascular status are within normal.   Assessment: Onychomycosis x 10. Microtrauma 2nd toe nail with brown discoloration bilateral. Hammer toe deformity 2 bilateral.  Pain in lower limbs.   Plan:  Reviewed findings. All nails debrided. Return as needed

## 2015-12-03 DIAGNOSIS — K912 Postsurgical malabsorption, not elsewhere classified: Secondary | ICD-10-CM | POA: Diagnosis not present

## 2016-01-02 ENCOUNTER — Other Ambulatory Visit: Payer: Self-pay | Admitting: Family Medicine

## 2016-01-02 MED FILL — POTASSIUM CL 10 MEQ TAB SA: 10 | 90 days supply | Qty: 90 | Fill #1

## 2016-01-02 MED FILL — CITALOPRAM HBR 20 MG TABLET: 20 | 90 days supply | Qty: 90 | Fill #1

## 2016-01-02 MED FILL — ESTRADIOL 0.1 MG PATCH: 0.1 | 84 days supply | Qty: 24 | Fill #1

## 2016-01-02 MED FILL — ROSUVASTATIN CALCIUM 20 MG: 20 | 90 days supply | Qty: 90 | Fill #1

## 2016-01-02 MED FILL — OLMESARTAN MEDOXOMIL 5 MG T: 5 | 90 days supply | Qty: 90 | Fill #1

## 2016-01-02 MED FILL — FUROSEMIDE 20 MG TABLET: 20 | 90 days supply | Qty: 90 | Fill #1

## 2016-01-05 MED FILL — CYCLOBENZAPRINE 10 MG TAB: 10 | 10 days supply | Qty: 30 | Fill #0

## 2016-01-05 MED FILL — PREMARIN VAGINAL CREAM-APPL: 0.625 | 30 days supply | Qty: 30 | Fill #0

## 2016-01-05 NOTE — Telephone Encounter (Signed)
Last seen and filled 07/31/15 #30   Please advise    KP

## 2016-01-30 ENCOUNTER — Encounter: Payer: Self-pay | Admitting: Podiatry

## 2016-02-05 ENCOUNTER — Ambulatory Visit: Payer: 59 | Admitting: Podiatry

## 2016-02-12 ENCOUNTER — Encounter: Payer: Self-pay | Admitting: Podiatry

## 2016-02-12 ENCOUNTER — Ambulatory Visit (INDEPENDENT_AMBULATORY_CARE_PROVIDER_SITE_OTHER): Payer: 59 | Admitting: Podiatry

## 2016-02-12 VITALS — BP 125/71 | HR 60

## 2016-02-12 DIAGNOSIS — B351 Tinea unguium: Secondary | ICD-10-CM

## 2016-02-12 DIAGNOSIS — R234 Changes in skin texture: Secondary | ICD-10-CM

## 2016-02-12 DIAGNOSIS — M79606 Pain in leg, unspecified: Secondary | ICD-10-CM

## 2016-02-12 DIAGNOSIS — L570 Actinic keratosis: Secondary | ICD-10-CM | POA: Diagnosis not present

## 2016-02-12 DIAGNOSIS — M79673 Pain in unspecified foot: Secondary | ICD-10-CM | POA: Diagnosis not present

## 2016-02-12 DIAGNOSIS — L989 Disorder of the skin and subcutaneous tissue, unspecified: Secondary | ICD-10-CM | POA: Diagnosis not present

## 2016-02-12 NOTE — Progress Notes (Signed)
Subjective:  60 year old female presents complaining of painful toe nails from being thick.   Objective:  All nails are thick, hard, and hypertrophic.  Discolorated nail plate 2nd bilateral.  Contracted 2nd digit bilateral.  Neurovascular status are within normal.   Assessment: Onychomycosis x 10. Microtrauma 2nd toe nail with brown discoloration bilateral. Hammer toe deformity 2 bilateral.  Pain in lower limbs.   Plan:  Reviewed findings. All nails debrided. Return as needed

## 2016-02-12 NOTE — Patient Instructions (Signed)
Seen for hypertrophic nails. All nails debrided. Return in 3 months or as needed.  

## 2016-02-23 ENCOUNTER — Other Ambulatory Visit: Payer: Self-pay | Admitting: Pharmacist

## 2016-02-23 NOTE — Patient Outreach (Signed)
[facility]) Care Management  02/23/2016  SHERINE ORELLANA [birth year] [id number]   Called patient to schedule Employee Sponsored Link to Wellness followup visit as she has not been seen in 2017.  Patient states that she has scheduled an appointment with Owens Shark, PharmD on 03/12/16 at 0900.   Will contact pharmacist to confirm.     Bennye Alm, PharmD [facility] PGY2 Pharmacy Resident [phone]

## 2016-02-27 ENCOUNTER — Encounter: Payer: Self-pay | Admitting: Family Medicine

## 2016-02-27 ENCOUNTER — Ambulatory Visit (INDEPENDENT_AMBULATORY_CARE_PROVIDER_SITE_OTHER): Payer: 59 | Admitting: Family Medicine

## 2016-02-27 VITALS — BP 112/70 | HR 67 | Temp 98.3°F | Wt 192.6 lb

## 2016-02-27 DIAGNOSIS — F32A Depression, unspecified: Secondary | ICD-10-CM

## 2016-02-27 DIAGNOSIS — I10 Essential (primary) hypertension: Secondary | ICD-10-CM

## 2016-02-27 DIAGNOSIS — E1151 Type 2 diabetes mellitus with diabetic peripheral angiopathy without gangrene: Secondary | ICD-10-CM

## 2016-02-27 DIAGNOSIS — E1165 Type 2 diabetes mellitus with hyperglycemia: Secondary | ICD-10-CM | POA: Diagnosis not present

## 2016-02-27 DIAGNOSIS — E119 Type 2 diabetes mellitus without complications: Secondary | ICD-10-CM

## 2016-02-27 DIAGNOSIS — K219 Gastro-esophageal reflux disease without esophagitis: Secondary | ICD-10-CM

## 2016-02-27 DIAGNOSIS — E538 Deficiency of other specified B group vitamins: Secondary | ICD-10-CM

## 2016-02-27 DIAGNOSIS — IMO0002 Reserved for concepts with insufficient information to code with codable children: Secondary | ICD-10-CM

## 2016-02-27 DIAGNOSIS — E785 Hyperlipidemia, unspecified: Secondary | ICD-10-CM | POA: Diagnosis not present

## 2016-02-27 DIAGNOSIS — Z114 Encounter for screening for human immunodeficiency virus [HIV]: Secondary | ICD-10-CM | POA: Diagnosis not present

## 2016-02-27 DIAGNOSIS — F329 Major depressive disorder, single episode, unspecified: Secondary | ICD-10-CM

## 2016-02-27 LAB — COMPREHENSIVE METABOLIC PANEL
ALT: 16 U/L (ref 0–35)
AST: 17 U/L (ref 0–37)
Albumin: 4.4 g/dL (ref 3.5–5.2)
Alkaline Phosphatase: 53 U/L (ref 39–117)
BUN: 17 mg/dL (ref 6–23)
CO2: 34 mEq/L — ABNORMAL HIGH (ref 19–32)
Calcium: 9.6 mg/dL (ref 8.4–10.5)
Chloride: 100 mEq/L (ref 96–112)
Creatinine, Ser: 0.86 mg/dL (ref 0.40–1.20)
GFR: 71.75 mL/min (ref >60.00)
Glucose, Bld: 95 mg/dL (ref 70–99)
Potassium: 4 mEq/L (ref 3.5–5.1)
Sodium: 140 mEq/L (ref 135–145)
Total Bilirubin: 0.7 mg/dL (ref 0.2–1.2)
Total Protein: 7.7 g/dL (ref 6.0–8.3)

## 2016-02-27 LAB — CBC WITH DIFFERENTIAL/PLATELET
Basophils Absolute: 0 10*3/uL (ref 0.0–0.1)
Basophils Relative: 0.5 % (ref 0.0–3.0)
Eosinophils Absolute: 0.2 10*3/uL (ref 0.0–0.7)
Eosinophils Relative: 2.5 % (ref 0.0–5.0)
HCT: 41.5 % (ref 36.0–46.0)
Hemoglobin: 13.6 g/dL (ref 12.0–15.0)
Lymphocytes Relative: 23.6 % (ref 12.0–46.0)
Lymphs Abs: 1.9 10*3/uL (ref 0.7–4.0)
MCHC: 32.8 g/dL (ref 30.0–36.0)
MCV: 85.2 fl (ref 78.0–100.0)
Monocytes Absolute: 0.7 10*3/uL (ref 0.1–1.0)
Monocytes Relative: 8.1 % (ref 3.0–12.0)
Neutro Abs: 5.4 10*3/uL (ref 1.4–7.7)
Neutrophils Relative %: 65.3 % (ref 43.0–77.0)
Platelets: 251 10*3/uL (ref 150.0–400.0)
RBC: 4.87 Mil/uL (ref 3.87–5.11)
RDW: 14 % (ref 11.5–15.5)
WBC: 8.3 10*3/uL (ref 4.0–10.5)

## 2016-02-27 LAB — LIPID PANEL
Cholesterol: 114 mg/dL (ref 0–200)
HDL: 58 mg/dL (ref >39.00)
LDL Cholesterol: 43 mg/dL (ref 0–99)
NonHDL: 55.74
Total CHOL/HDL Ratio: 2
Triglycerides: 63 mg/dL (ref 0.0–149.0)
VLDL: 12.6 mg/dL (ref 0.0–40.0)

## 2016-02-27 LAB — HEMOGLOBIN A1C: Hgb A1c MFr Bld: 5.9 % (ref 4.6–6.5)

## 2016-02-27 LAB — HIV ANTIBODY (ROUTINE TESTING W REFLEX): HIV 1&2 Ab, 4th Generation: NONREACTIVE

## 2016-02-27 MED ORDER — RANITIDINE HCL 150 MG PO TABS: 150.0000 mg | ORAL_TABLET | Freq: Two times a day (BID) | ORAL | 3 refills | Status: DC

## 2016-02-27 MED ORDER — CITALOPRAM HYDROBROMIDE 20 MG PO TABS: 20.0000 mg | ORAL_TABLET | Freq: Every evening | ORAL | 3 refills | Status: DC

## 2016-02-27 MED ORDER — CYANOCOBALAMIN 500 MCG/0.1ML NA SOLN: NASAL | 5 refills | Status: DC

## 2016-02-27 MED ORDER — OLMESARTAN MEDOXOMIL 5 MG PO TABS: 5.0000 mg | ORAL_TABLET | Freq: Every day | ORAL | 3 refills | Status: DC

## 2016-02-27 MED ORDER — GLUCOSE BLOOD VI STRP: ORAL_STRIP | 12 refills | Status: DC

## 2016-02-27 MED ORDER — CYCLOBENZAPRINE HCL 10 MG PO TABS: ORAL_TABLET | ORAL | 0 refills | Status: DC

## 2016-02-27 MED ORDER — POTASSIUM CHLORIDE CRYS ER 10 MEQ PO TBCR: EXTENDED_RELEASE_TABLET | ORAL | 1 refills | Status: DC

## 2016-02-27 MED ORDER — FUROSEMIDE 20 MG PO TABS: ORAL_TABLET | ORAL | 1 refills | Status: DC

## 2016-02-27 MED ORDER — ROSUVASTATIN CALCIUM 20 MG PO TABS: ORAL_TABLET | ORAL | 1 refills | Status: DC

## 2016-02-27 MED FILL — raNITIdine HCL 150 MG TABS: 150 | 90 days supply | Qty: 180 | Fill #0

## 2016-02-27 MED FILL — CYCLOBENZAPRINE 10 MG TAB: 10 | 10 days supply | Qty: 30 | Fill #0

## 2016-02-27 NOTE — Assessment & Plan Note (Signed)
Check labs 

## 2016-02-27 NOTE — Progress Notes (Signed)
Pre visit review using our clinic review tool, if applicable. No additional management support is needed unless otherwise documented below in the visit note. 

## 2016-02-27 NOTE — Progress Notes (Signed)
Patient ID: [name], female    DOB: February 26, 1956  Age: 60 y.o. MRN: [id number]    Subjective:  Subjective  HPI SHAQUASHA GERSTEL presents for dm, cholesterol and htn.  HPI HYPERTENSION   Blood pressure range-controlled  Chest pain- no      Dyspnea- no Lightheadedness- no   Edema- no  Other side effects - no   Medication compliance: good Low salt diet- yes    DIABETES    Blood Sugar ranges-lows and running high lately  Polyuria- no New Visual problems- no  Hypoglycemic symptoms- no  Other side effects-no Medication compliance - good Last eye exam- ---next one next week Foot exam- today   HYPERLIPIDEMIA  Medication compliance- good RUQ pain- no  Muscle aches- no Other side effects-no    Review of Systems  Constitutional: Negative for appetite change, diaphoresis, fatigue and unexpected weight change.  Eyes: Negative for pain, redness and visual disturbance.  Respiratory: Negative for cough, chest tightness, shortness of breath and wheezing.   Cardiovascular: Negative for chest pain, palpitations and leg swelling.  Endocrine: Negative for cold intolerance, heat intolerance, polydipsia, polyphagia and polyuria.  Genitourinary: Negative for difficulty urinating, dysuria and frequency.  Neurological: Negative for dizziness, light-headedness, numbness and headaches.    History Past Medical History:  Diagnosis Date  . Allergic rhinitis   . Anxiety   . Asthma    hx bronchial asthma at times with upper resp infection  . Depression   . Diabetes mellitus without complication ([place])   . GERD (gastroesophageal reflux disease)   . Headache(784.0)    migraine and cluster headaches  . Hyperlipemia   . Hypertension   . IBS (irritable bowel syndrome)   . Menopause   . Neuromuscular disorder ([place]) 2009   hx of fibromyalgia, polyarthralsia (surgery induced)  . Stress incontinence    at times    She has a past surgical history that includes Cesarean section ([place]); Cervical laminectomy (2005 & 2009); Foot surgery (2005); right knee (1981); Nasal sinus surgery ([place]); Laparoscopic gastric banding (12/30/09); Esophagogastroduodenoscopy (06/29/2011); Hysteroscopy (1999); laparoscopy (09/24/2011); Gastric banding port revision (09/24/2011); Tonsillectomy (1971); Abdominal hysterectomy (12/1999); Cholecystectomy (1986); Tubal ligation (1993); right knee arthroscopy and arthrotomy ([id number]); Cervical conization w/bx (june 1990); laparoscopy (07/03/2012); Laparoscopic revision of gastric band (07/03/2012); Mesh applied to lap port (07/03/2012); Dilation and curettage of uterus ( april-1989); Laparoscopic repair and removal of gastric band (N/A, 07/02/2013); and Laparoscopic gastric sleeve resection (N/A, 07/02/2013).   Her family history includes Atrial fibrillation in her mother; Breast cancer in her mother; Cancer in her mother; Diabetes in her father and mother; Heart attack in her father; Hypertension in her father and mother; Stroke in her father and mother.She reports that she has never smoked. She has never used smokeless tobacco. She reports that she does not drink alcohol or use drugs.  Current Outpatient Prescriptions on File Prior to Visit  Medication Sig Dispense Refill  . Blood Glucose Monitoring Suppl (BAYER CONTOUR LINK MONITOR) W/DEVICE KIT Test blood sugar once daily DX code: E11.9 1 kit 1  . Ca Phosphate-Cholecalciferol (CALTRATE GUMMY BITES PO) Take 2 tablets by mouth at bedtime.    . Efinaconazole (JUBLIA) 10 % SOLN Apply 1 applicator topically every morning. 1 Bottle 3  . estradiol (VIVELLE-DOT) 0.1 MG/24HR Place 1 patch (0.1 mg total) onto the skin 2 (two) times a week. Monday and Thursday 8 patch 11  . glucose blood test strip Test blood sugar once daily. Dx  Code: E11.9 100 each 12  . loratadine (CLARITIN) 10 MG tablet Take 10 mg by mouth every evening.     [name] DELICA LANCETS FINE MISC check blood sugar daily 100 each 12  .  Pediatric Multivit-Minerals-C (ONE-A-DAY SCOOBY-DOO GUMMIES PO) Take 1 tablet by mouth daily.    . vitamin B-12 (CYANOCOBALAMIN) 100 MCG tablet Take 100 mcg by mouth daily.     No current facility-administered medications on file prior to visit.      Objective:  Objective  Physical Exam  Constitutional: She is oriented to person, place, and time. She appears well-developed and well-nourished.  HENT:  Head: Normocephalic and atraumatic.  Eyes: Conjunctivae and EOM are normal.  Neck: Normal range of motion. Neck supple. No JVD present. Carotid bruit is not present. No thyromegaly present.  Cardiovascular: Normal rate, regular rhythm and normal heart sounds.   No murmur heard. Pulmonary/Chest: Effort normal and breath sounds normal. No respiratory distress. She has no wheezes. She has no rales. She exhibits no tenderness.  Musculoskeletal: She exhibits no edema.  Neurological: She is alert and oriented to person, place, and time.  Psychiatric: She has a normal mood and affect. Her behavior is normal. Judgment and thought content normal.  Nursing note and vitals reviewed.  BP 112/70 (BP Location: Left Arm, Patient Position: Sitting, Cuff Size: Normal)   Pulse 67   Temp 98.3 F (36.8 C)   Wt 192 lb 9.6 oz (87.4 kg)   SpO2 98%   BMI 26.86 kg/m  Wt Readings from Last 3 Encounters:  02/27/16 192 lb 9.6 oz (87.4 kg)  07/31/15 194 lb 12.8 oz (88.4 kg)  05/29/15 193 lb 9.6 oz (87.8 kg)     Lab Results  Component Value Date   WBC 6.8 07/31/2015   HGB 13.3 07/31/2015   HCT 40.3 07/31/2015   PLT 262.0 07/31/2015   GLUCOSE 95 07/31/2015   CHOL 123 07/31/2015   TRIG 63.0 07/31/2015   HDL 55.00 07/31/2015   LDLCALC 56 07/31/2015   ALT 14 07/31/2015   AST 15 07/31/2015   NA 139 07/31/2015   K 4.2 07/31/2015   CL 100 07/31/2015   CREATININE 0.90 07/31/2015   BUN 16 07/31/2015   CO2 33 (H) 07/31/2015   TSH 1.87 12/24/2013   HGBA1C 5.8 07/31/2015   MICROALBUR 0.1 08/01/2014     Mm Screening Breast Tomo Bilateral  Result Date: 06/12/2015 CLINICAL DATA:  Screening. EXAM: DIGITAL SCREENING BILATERAL MAMMOGRAM WITH 3D TOMO WITH CAD COMPARISON:  Previous exam(s). ACR Breast Density Category c: The breast tissue is heterogeneously dense, which may obscure small masses. FINDINGS: There are no findings suspicious for malignancy. Images were processed with CAD. IMPRESSION: No mammographic evidence of malignancy. A result letter of this screening mammogram will be mailed directly to the patient. RECOMMENDATION: Screening mammogram in one year. (Code:SM-B-01Y) BI-RADS CATEGORY  1: Negative. Electronically Signed   By: Claudie Revering M.D.   On: 06/12/2015 10:45     Assessment & Plan:  Plan  I am having Ms. Ewing start on ranitidine. I am also having her maintain her loratadine, Ca Phosphate-Cholecalciferol (CALTRATE GUMMY BITES PO), Pediatric Multivit-Minerals-C (ONE-A-DAY SCOOBY-DOO GUMMIES PO), estradiol, vitamin E-70, ONETOUCH DELICA LANCETS FINE, BAYER CONTOUR LINK MONITOR, Efinaconazole, glucose blood, PREMARIN, rosuvastatin, potassium chloride, olmesartan, glucose blood, furosemide, citalopram, and cyclobenzaprine.  Meds ordered this encounter  Medications  . PREMARIN vaginal cream    Sig: Apply 1 application topically 2 (two) times a week.    Refill:  1  . rosuvastatin (CRESTOR) 20 MG tablet    Sig: TAKE 1 TABLET (20 MG TOTAL) BY MOUTH AT BEDTIME.    Dispense:  90 tablet    Refill:  1  . potassium chloride (K-DUR,KLOR-CON) 10 MEQ tablet    Sig: TAKE 1 TABLET (10 MEQ TOTAL) BY MOUTH DAILY.    Dispense:  90 tablet    Refill:  1  . olmesartan (BENICAR) 5 MG tablet    Sig: Take 1 tablet (5 mg total) by mouth daily.    Dispense:  90 tablet    Refill:  3  . glucose blood (ONETOUCH VERIO) test strip    Sig: Check blood sugar daily    Dispense:  100 each    Refill:  12  . furosemide (LASIX) 20 MG tablet    Sig: TAKE 1 TABLET (20 MG TOTAL) BY MOUTH EVERY MORNING.     Dispense:  90 tablet    Refill:  1  . citalopram (CELEXA) 20 MG tablet    Sig: Take 1 tablet (20 mg total) by mouth every evening.    Dispense:  90 tablet    Refill:  3  . cyclobenzaprine (FLEXERIL) 10 MG tablet    Sig: TAKE 1 TABLET (10 MG TOTAL) BY MOUTH 3 TIMES DAILY AS NEEDED FOR MUSCLE SPASMS.    Dispense:  30 tablet    Refill:  0  . ranitidine (ZANTAC) 150 MG tablet    Sig: Take 1 tablet (150 mg total) by mouth 2 (two) times daily.    Dispense:  180 tablet    Refill:  3    Problem List Items Addressed This Visit      Unprioritized   GERD (gastroesophageal reflux disease)   Relevant Medications   ranitidine (ZANTAC) 150 MG tablet   DM (diabetes mellitus) type II uncontrolled, periph vascular disorder (HCC)    Check labs      Relevant Medications   rosuvastatin (CRESTOR) 20 MG tablet   olmesartan (BENICAR) 5 MG tablet   furosemide (LASIX) 20 MG tablet   Other Relevant Orders   Comprehensive metabolic panel   CBC with Differential/Platelet   Hemoglobin A1c   Essential hypertension    Stable  con't meds      Relevant Medications   rosuvastatin (CRESTOR) 20 MG tablet   potassium chloride (K-DUR,KLOR-CON) 10 MEQ tablet   olmesartan (BENICAR) 5 MG tablet   furosemide (LASIX) 20 MG tablet    Other Visit Diagnoses    Encounter for screening for HIV    -  Primary   Relevant Orders   HIV antibody   Hyperlipidemia LDL goal <70       Relevant Medications   rosuvastatin (CRESTOR) 20 MG tablet   olmesartan (BENICAR) 5 MG tablet   furosemide (LASIX) 20 MG tablet   Other Relevant Orders   Comprehensive metabolic panel   Lipid panel   CBC with Differential/Platelet   Hyperlipidemia       Relevant Medications   rosuvastatin (CRESTOR) 20 MG tablet   olmesartan (BENICAR) 5 MG tablet   furosemide (LASIX) 20 MG tablet   Controlled type 2 diabetes mellitus without complication, without long-term current use of insulin (HCC)       Relevant Medications   rosuvastatin  (CRESTOR) 20 MG tablet   olmesartan (BENICAR) 5 MG tablet   glucose blood (ONETOUCH VERIO) test strip   Depression       Relevant Medications   citalopram (CELEXA) 20 MG tablet  Follow-up: Return in about 6 months (around 08/29/2016) for hypertension, hyperlipidemia, diabetes II.  [name], DO

## 2016-02-27 NOTE — Patient Instructions (Signed)

## 2016-02-27 NOTE — Assessment & Plan Note (Signed)
Stable con't meds 

## 2016-03-01 ENCOUNTER — Other Ambulatory Visit: Payer: Self-pay

## 2016-03-01 DIAGNOSIS — E538 Deficiency of other specified B group vitamins: Secondary | ICD-10-CM

## 2016-03-01 MED ORDER — CYANOCOBALAMIN 500 MCG/0.1ML NA SOLN: NASAL | 5 refills | Status: DC

## 2016-03-01 MED FILL — NASCOBAL 500 MCG NASAL SPRY: 500 | 28 days supply | Qty: 4 | Fill #0

## 2016-03-03 DIAGNOSIS — H5203 Hypermetropia, bilateral: Secondary | ICD-10-CM | POA: Diagnosis not present

## 2016-03-03 DIAGNOSIS — H524 Presbyopia: Secondary | ICD-10-CM | POA: Diagnosis not present

## 2016-03-03 DIAGNOSIS — H52223 Regular astigmatism, bilateral: Secondary | ICD-10-CM | POA: Diagnosis not present

## 2016-03-12 ENCOUNTER — Encounter: Payer: Self-pay | Admitting: Family Medicine

## 2016-03-12 ENCOUNTER — Other Ambulatory Visit: Payer: Self-pay

## 2016-03-12 NOTE — Patient Outreach (Signed)
Link to [employer] closure due to patient not keeping required appointments.    Deanne Coffer, PharmD, [place] [phone]

## 2016-03-19 ENCOUNTER — Emergency Department
Admission: EM | Admit: 2016-03-19 | Discharge: 2016-03-19 | Disposition: A | Discharge status: Home / Self Care | Payer: 59 | Source: Home / Self Care | Attending: Family Medicine | Admitting: Family Medicine

## 2016-03-19 DIAGNOSIS — H65192 Other acute nonsuppurative otitis media, left ear: Secondary | ICD-10-CM

## 2016-03-19 DIAGNOSIS — J069 Acute upper respiratory infection, unspecified: Secondary | ICD-10-CM

## 2016-03-19 LAB — POCT INFLUENZA A/B
Influenza A, POC: NEGATIVE
Influenza B, POC: NEGATIVE

## 2016-03-19 LAB — POCT RAPID STREP A (OFFICE): Rapid Strep A Screen: NEGATIVE

## 2016-03-19 MED ORDER — AMOXICILLIN-POT CLAVULANATE 875-125 MG PO TABS: 1.0000 | ORAL_TABLET | Freq: Two times a day (BID) | ORAL | 0 refills | Status: DC

## 2016-03-19 MED ORDER — PREDNISOLONE 15 MG/5ML PO SYRP
30.0000 mg | ORAL_SOLUTION | Freq: Every day | ORAL | 0 refills | Status: AC
Start: 2016-03-19 — End: 2016-03-24

## 2016-03-19 MED ORDER — PROMETHAZINE-CODEINE 6.25-10 MG/5ML PO SYRP: 5.0000 mL | ORAL_SOLUTION | Freq: Three times a day (TID) | ORAL | 0 refills | Status: DC | PRN

## 2016-03-19 NOTE — Discharge Instructions (Signed)
°  You may take 400-600mg  Ibuprofen (Motrin) every 6-8 hours for fever and pain  Alternate with Tylenol  You may take 500mg  Tylenol every 4-6 hours as needed for fever and pain  Follow-up with your primary care provider next week for recheck of symptoms if not improving.  Be sure to drink plenty of fluids and rest, at least 8hrs of sleep a night, preferably more while you are sick. Return urgent care or go to closest ER if you cannot keep down fluids/signs of dehydration, fever not reducing with Tylenol, difficulty breathing/wheezing, stiff neck, worsening condition, or other concerns (see below)  Please take antibiotics as prescribed and be sure to complete entire course even if you start to feel better to ensure infection does not come back.   Phenergan-codeine is to help reduce your coughing. Do not take more frequently than prescribed. Do not drink alcohol or drive while taking as it can cause drowsiness. Do not share this medication with anyone else as it can cause severe reactions if they are allergic or if taken in combination with other medications you may not be aware of.

## 2016-03-19 NOTE — ED Provider Notes (Signed)
CSN: [id number]     Arrival date & time 03/19/16  1802 History   First MD Initiated Contact with Patient 03/19/16 1909     Chief Complaint  Patient presents with  . Cough  . Sore Throat   (Consider location/radiation/quality/duration/timing/severity/associated sxs/prior Treatment) HPI [name] is a 60 y.o. female presenting to UC with c/o gradually worsening URI symptoms that started 3 days ago with nasal congestion, post nasal drip, scratchy throat, mildly intermittent productive cough, now bilateral ear pain that is worse in Left ear.  Pain is aching and throbbing, moderate severity. She has taken Mucinex and Advil with mild relief. 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  She reports hx of asthma but has not had to use her inhaler recently. She notes if she does not "catch things early" it goes into her chest, develops into bronchitis, and requires her to use an inhaler for several weeks. Denies chest pain or SOB at this time.    Past Medical History:  Diagnosis Date  . Allergic rhinitis   . Anxiety   . Asthma    hx bronchial asthma at times with upper resp infection  . Depression   . Diabetes mellitus without complication ([place])   . GERD (gastroesophageal reflux disease)   . Headache(784.0)    migraine and cluster headaches  . Hyperlipemia   . Hypertension   . IBS (irritable bowel syndrome)   . Menopause   . Neuromuscular disorder ([place]) 2009   hx of fibromyalgia, polyarthralsia (surgery induced)  . Stress incontinence    at times   Past Surgical History:  Procedure Laterality Date  . ABDOMINAL HYSTERECTOMY  12/1999  . CERVICAL CONIZATION W/BX  june 1990   dysplasia of cervix/used 5Fu cream for 3 months  . CERVICAL LAMINECTOMY  2005 & 2009   X 2   NO ROM PROBLEMS  . [place]  . DILATION AND CURETTAGE OF UTERUS   [phone]   missed abortion  . ESOPHAGOGASTRODUODENOSCOPY  06/29/2011   Procedure:  ESOPHAGOGASTRODUODENOSCOPY (EGD);  Surgeon: Shann Medal, MD;  Location: [facility];  Service: General;  Laterality: N/A;  . FOOT SURGERY  2005   RT HEEL  . GASTRIC BANDING PORT REVISION  09/24/2011   Procedure: GASTRIC BANDING PORT REVISION;  Surgeon: Pedro Earls, MD;  Location: [facility];  Service: General;  Laterality: N/A;  . HYSTEROSCOPY  1999  . LAPAROSCOPIC GASTRIC BANDING  12/30/09  . LAPAROSCOPIC GASTRIC SLEEVE RESECTION N/A 07/02/2013   Procedure: LAPAROSCOPIC GASTRIC SLEEVE RESECTION upper endoscopy;  Surgeon: Pedro Earls, MD;  Location: [facility];  Service: General;  Laterality: N/A;  . LAPAROSCOPIC REPAIR AND REMOVAL OF GASTRIC BAND N/A 07/02/2013   Procedure: LAPAROSCOPIC REMOVAL OF GASTRIC BAND;  Surgeon: Pedro Earls, MD;  Location: [facility];  Service: General;  Laterality: N/A;  . LAPAROSCOPIC REVISION OF GASTRIC BAND  07/03/2012   Procedure: LAPAROSCOPIC REVISION OF GASTRIC BAND;  Surgeon: Pedro Earls, MD;  Location: [facility];  Service: General;  Laterality: N/A;  removal of old lap. band port, replaced with AP standard band  . LAPAROSCOPY  09/24/2011   Procedure: LAPAROSCOPY DIAGNOSTIC;  Surgeon: Pedro Earls, MD;  Location: [facility];  Service: General;  Laterality: N/A;  . LAPAROSCOPY  07/03/2012   Procedure: LAPAROSCOPY DIAGNOSTIC;  Surgeon: Pedro Earls, MD;  Location: [facility];  Service: General;  Laterality: N/A;  Exploratory Laparoscopy   . MESH APPLIED TO LAP PORT  07/03/2012   Procedure: MESH APPLIED TO LAP PORT;  Surgeon: Pedro Earls, MD;  Location: [facility];  Service: General;  Laterality: N/A;  . [place]   X 2  . right knee  1981   ARTHROSCOPY AND ARTHROTOMY  . right knee arthroscopy and arthrotomy  12-1979  . TONSILLECTOMY  1971  . TUBAL LIGATION  1993   WITH C -SECTION   Family History  Problem Relation Age of Onset  . Diabetes Mother   . Stroke Mother   . Breast cancer Mother   . Atrial fibrillation Mother   .  Hypertension Mother   . Cancer Mother     breast  . Diabetes Father   . Stroke Father   . Hypertension Father   . Heart attack Father   . Thyroid disease    . Heart disease     Social History  Substance Use Topics  . Smoking status: Never Smoker  . Smokeless tobacco: Never Used  . Alcohol use No   OB History    Gravida Para Term Preterm AB Living   3 0 0 0 1 2   SAB TAB Ectopic Multiple Live Births   1 0 0 0       Review of Systems  Constitutional: Negative for chills and fever.  HENT: Positive for congestion, ear pain, postnasal drip, sinus pressure and sore throat. Negative for trouble swallowing and voice change.   Respiratory: Positive for cough. Negative for shortness of breath.   Cardiovascular: Negative for chest pain and palpitations.  Gastrointestinal: Negative for abdominal pain, diarrhea, nausea and vomiting.  Musculoskeletal: Negative for arthralgias, back pain and myalgias.  Skin: Negative for rash.  Neurological: Positive for headaches. Negative for dizziness and light-headedness.    Allergies  Isoniazid; Nitrofurantoin; Hctz [hydrochlorothiazide]; Promethazine hcl [promethazine hcl]; Macrolides and ketolides; Morphine; Percocet [oxycodone-acetaminophen]; Roxicet [oxycodone-acetaminophen]; Sulfamethoxazole-trimethoprim; and Fentanyl  Home Medications   Prior to Admission medications   Medication Sig Start Date End Date Taking? Authorizing Provider  amoxicillin-clavulanate (AUGMENTIN) 875-125 MG tablet Take 1 tablet by mouth 2 (two) times daily. One po bid x 7 days 03/19/16   Noland Fordyce, PA-C  Blood Glucose Monitoring Suppl (BAYER CONTOUR LINK MONITOR) W/DEVICE KIT Test blood sugar once daily DX code: E11.9 07/18/14   [name] Chase, DO  Ca Phosphate-Cholecalciferol (CALTRATE GUMMY BITES PO) Take 2 tablets by mouth at bedtime.    Historical Provider, MD  citalopram (CELEXA) 20 MG tablet Take 1 tablet (20 mg total) by mouth every evening. 02/27/16   [name] Chase, DO  Cyanocobalamin 500 MCG/0.1ML SOLN 1 spray in one  nostril weekly 03/01/16   [name] Chase, DO  cyclobenzaprine (FLEXERIL) 10 MG tablet TAKE 1 TABLET (10 MG TOTAL) BY MOUTH 3 TIMES DAILY AS NEEDED FOR MUSCLE SPASMS. 02/27/16   Alferd Apa Lowne Chase, DO  Efinaconazole (JUBLIA) 10 % SOLN Apply 1 applicator topically every morning. 08/21/14   [name], DPM  estradiol (VIVELLE-DOT) 0.1 MG/24HR Place 1 patch (0.1 mg total) onto the skin 2 (two) times a week. Monday and Thursday 06/27/12   [name], MD  furosemide (LASIX) 20 MG tablet TAKE 1 TABLET (20 MG TOTAL) BY MOUTH EVERY MORNING. 02/27/16   [name] Chase, DO  glucose blood [facility] VERIO) test strip Check blood sugar daily 02/27/16   [name], DO  glucose blood test strip Test blood sugar once daily. Dx Code: E11.9 07/31/15   [name] Chase, DO  loratadine (CLARITIN) 10 MG tablet Take 10 mg by mouth every evening.     Historical Provider, MD  olmesartan (BENICAR) 5 MG tablet Take 1 tablet (5 mg total) by mouth daily. 02/27/16   [name] Chase, DO  [facility] DELICA LANCETS FINE MISC check blood sugar daily 07/16/14   [name], DO  Pediatric Multivit-Minerals-C (ONE-A-DAY SCOOBY-DOO GUMMIES PO) Take 1 tablet by mouth daily.    Historical Provider, MD  potassium chloride (K-DUR,KLOR-CON) 10 MEQ tablet TAKE 1 TABLET (10 MEQ TOTAL) BY MOUTH DAILY. 02/27/16   [name] Chase, DO  prednisoLONE (PRELONE) 15 MG/5ML syrup Take 10 mLs (30 mg total) by mouth daily. 03/19/16 03/24/16  Noland Fordyce, PA-C  PREMARIN vaginal cream Apply 1 application topically 2 (two) times a week. 01/05/16   Historical Provider, MD  promethazine-codeine (PHENERGAN WITH CODEINE) 6.25-10 MG/5ML syrup Take 5 mLs by mouth every 8 (eight) hours as needed for cough. 03/19/16   Noland Fordyce, PA-C  ranitidine (ZANTAC) 150 MG tablet Take 1 tablet (150 mg total) by mouth 2 (two) times daily. 02/27/16   [name]  Chase, DO  rosuvastatin (CRESTOR) 20 MG tablet TAKE 1 TABLET (20 MG TOTAL) BY MOUTH AT BEDTIME. 02/27/16   [name] Chase, DO   Meds Ordered and Administered this Visit  Medications - No data to display  BP 166/70 (BP Location: Left Arm)   Pulse 69   Temp 98.2 F (36.8 C) (Oral)   Ht 5' 11" (1.803 m)   Wt 197 lb 12.8 oz (89.7 kg)   SpO2 98%   BMI 27.59 kg/m  No data found.   Physical Exam  Constitutional: She appears well-developed and well-nourished. No distress.  HENT:  Head: Normocephalic and atraumatic.  Right Ear: Tympanic membrane normal.  Left Ear: No swelling or tenderness. Tympanic membrane is erythematous.  Nose: Mucosal edema present.  Mouth/Throat: Uvula is midline, oropharynx is clear and moist and mucous membranes are normal.  Eyes: Conjunctivae are normal. No scleral icterus.  Neck: Normal range of motion.  Cardiovascular: Normal rate, regular rhythm and normal heart sounds.   Pulmonary/Chest: Effort normal and breath sounds normal. No respiratory distress. She has no wheezes. She has no rales. She exhibits no tenderness.  Abdominal: Soft. She exhibits no distension. There is no tenderness.  Musculoskeletal: Normal range of motion.  Neurological: She is alert.  Skin: Skin is warm and dry. She is not diaphoretic.  Nursing note and vitals reviewed.   Urgent Care Course   Clinical Course    Procedures (including critical care time)  Labs Review Labs Reviewed  POCT RAPID STREP A (OFFICE)  POCT INFLUENZA A/B    Imaging Review No results found.   MDM   1. URI (upper respiratory infection)   2. Acute nonsuppurative otitis media of left ear    Pt c/o worsening URI symptoms. Exam c/w Left AOM.  Rx: augmentin, prednisolone (pt requested liquid form), phenergan-codeine for cough (pt states she knows only to take at night due to drowsiness)  Advised pt to use acetaminophen and ibuprofen as needed for fever and pain. Encouraged rest and fluids.  F/u with PCP in 1 week if not improving, sooner if worsening. Pt verbalized understanding and agreement with tx plan.     Noland Fordyce, PA-C 03/20/16 [phone]

## 2016-03-19 NOTE — ED Triage Notes (Signed)
Pt started with drainage Tuesday, Scratchy throat on Wednesday, and sore throat coughing Thursday.  Now has ear pain as well as all other symptoms.

## 2016-03-21 ENCOUNTER — Telehealth: Payer: Self-pay | Admitting: Emergency Medicine

## 2016-03-21 NOTE — Telephone Encounter (Signed)
Pt states that she is feeling somewhat better, taking her antbs and drinking plenty of fluids.  She will continue meds and if anything changes, will contact her PCP.  [place]

## 2016-03-25 ENCOUNTER — Encounter: Payer: 59 | Attending: Surgery | Admitting: Dietician

## 2016-03-25 DIAGNOSIS — Z713 Dietary counseling and surveillance: Secondary | ICD-10-CM | POA: Insufficient documentation

## 2016-03-25 DIAGNOSIS — E669 Obesity, unspecified: Secondary | ICD-10-CM

## 2016-03-25 NOTE — Patient Instructions (Addendum)
Goals:  Follow Phase 3B: High Protein + Non-Starchy Vegetables  Eat 3-6 small meals/snacks, every 3-5 hrs  Increase lean protein foods to meet 60g goal  Increase fluid intake to 64oz +  Avoid drinking 15 minutes before, during and 30 minutes after eating  Resume exercise when feeling better  Surgery date: 07/03/12 Surgery type: LAGB Revision Start weight at [facility]: 262.6 lbs  Surgery date: 07/02/2013 Surgery type: Gastric Sleeve Weight today: 193.2 lbs  Weight change: 2.7 lbs weight gain Total weight lost:  69.4 lbs  Weight goal: 180 lbs % goal met: 87%  TANITA  BODY COMP RESULTS  09/15/11 01/31/12 05/08/12 08/30/12 11/06/12 01/04/13 01/24/13 03/23/13 06/21/13 07/17/13 09/03/13 10/18/13 01/23/14 04/29/14 08/29/14 02/27/15 03/25/16   BMI (kg/m^2) 30.3 31.4 32.6 31.0 32.1 31.5 31.9 31.7 32.8 30.3 29.3 28.1 27.8 26.6 26.8 26.6 26.9   FM (lbs) 100.5 105.5 111.0 105.0 110.5 106.5 109.0 112.0 116.5 104.5 95.5 89.5 84.0 83.5 82.0 84.0 86.2   FFM (lbs) 116.0 120.0 123.0 117.5 119.5 119.5 120.0 115.0 118.5 113.0 114.5 112.0 115.0 107.0 110.0 106.5 107.0   TBW (lbs) 85.0 88.0 90.0 86.0 87.5 87.5 88.0 84.0 87.0 82.5 84.0 82.0 84.0 78.5 80.5 78.0 76.2

## 2016-03-25 NOTE — Progress Notes (Signed)
Follow-up visit:  2.5 Years Post-Operative Gastric Sleeve Surgery  Medical Nutrition Therapy:  Appt start time: 0810 end time:  0840.  Primary concerns today: Post-operative Bariatric Surgery Nutrition Management. [name] returns today with a 2.7 lb weight gain since last year. All numbers within normal range.  Has had a lot of respiratory infections recently (last 2 weeks). Has been working a lot lately and walking at work. Has not been exercising separately lately.   Knees have not been bothering her.   Weight hangs between 188-193 lbs. Would still like to get down to 180 lbs.   Surgery date: 07/03/12 Surgery type: LAGB Revision Start weight at [facility]: 262.6 lbs  Surgery date: 07/02/2013 Surgery type: Gastric Sleeve Weight today: 193.2 lbs  Weight change: 2.7 lbs weight gain Total weight lost:  69.4 lbs  Weight goal: 180 lbs % goal met: 87%  TANITA  BODY COMP RESULTS  09/15/11 01/31/12 05/08/12 08/30/12 11/06/12 01/04/13 01/24/13 03/23/13 06/21/13 07/17/13 09/03/13 10/18/13 01/23/14 04/29/14 08/29/14 02/27/15 03/25/16   BMI (kg/m^2) 30.3 31.4 32.6 31.0 32.1 31.5 31.9 31.7 32.8 30.3 29.3 28.1 27.8 26.6 26.8 26.6 26.9   FM (lbs) 100.5 105.5 111.0 105.0 110.5 106.5 109.0 112.0 116.5 104.5 95.5 89.5 84.0 83.5 82.0 84.0 86.2   FFM (lbs) 116.0 120.0 123.0 117.5 119.5 119.5 120.0 115.0 118.5 113.0 114.5 112.0 115.0 107.0 110.0 106.5 107.0   TBW (lbs) 85.0 88.0 90.0 86.0 87.5 87.5 88.0 84.0 87.0 82.5 84.0 82.0 84.0 78.5 80.5 78.0 76.2    Preferred Learning Style:   No preference indicated   Learning Readiness:   Ready  24-hr recall: B (AM): Premier Protein Shake (30 g) Snk (AM): Dannon Light and Fit (12 g)  L (PM): 3 oz grilled chicken and salad and green beans or squash or ground beef and cheese  (21 g) Snk (PM):  Premier Protein Shake (30 g) D PM): 3 oz chicken or flounder with collards (21 g) Snk (PM): none  Fluid intake:64 oz mostly water or crystal light Estimated total protein  intake: 70-83 g   Medications: no longer taking Victoza, blood pressure medication "cut in half" Supplementation: taking, added vitamin D  CBG monitoring: 2-3 x day  Average CBG per patient: 80-90 mg/dl fasting Last patient reported A1c: 5.9% in August  Using straws: No Drinking while eating: No Hair loss: No Carbonated beverages: No N/V/D/C: Has some some loose stools after protein shakes  Dumping syndrome: No  Recent physical activity: not lately  Progress Towards Goal(s):  In progress.  Nutritional Diagnosis:  [place]-3.3 Overweight/obesity related to past poor dietary habits and physical inactivity as evidenced by patient w/ recent Sleeve Gastrectomy surgery following dietary guidelines for continued weight loss.    Intervention:  Nutrition education/diet reinforcment. Plan to continue with Phase 3B High Protein + Non-Starchy Vegetable diet and increase physical activity when work schedule permits.   Goals:  Follow Phase 3B: High Protein + Non-Starchy Vegetables  Eat 3-6 small meals/snacks, every 3-5 hrs  Increase lean protein foods to meet 60g goal  Increase fluid intake to 64oz +  Avoid drinking 15 minutes before, during and 30 minutes after eating  Resume exercise when feeling better   Teaching Method Utilized:  Visual Auditory Hands on  Barriers to learning/adherence to lifestyle change: none  Demonstrated degree of understanding via:  Teach Back   Monitoring/Evaluation:  Dietary intake, exercise, and body weight. Follow up in 6 months for 26 month post-op visit.

## 2016-04-19 MED FILL — OLMESARTAN MEDOXOMIL 5 MG T: 5 | 90 days supply | Qty: 90 | Fill #2

## 2016-04-19 MED FILL — CITALOPRAM HBR 20 MG TABLET: 20 | 90 days supply | Qty: 90 | Fill #2

## 2016-04-19 MED FILL — ROSUVASTATIN CALCIUM 20 MG: 20 | 90 days supply | Qty: 90 | Fill #0

## 2016-04-19 MED FILL — FUROSEMIDE 20 MG TABLET: 20 | 90 days supply | Qty: 90 | Fill #0

## 2016-04-19 MED FILL — POTASSIUM CL 10 MEQ TAB SA: 10 | 90 days supply | Qty: 90 | Fill #0

## 2016-04-19 MED FILL — ACCU-CHEK AVIVA PLUS TEST S: 90 days supply | Qty: 100 | Fill #1

## 2016-05-12 ENCOUNTER — Ambulatory Visit: Payer: 59 | Admitting: Podiatry

## 2016-05-17 ENCOUNTER — Ambulatory Visit (INDEPENDENT_AMBULATORY_CARE_PROVIDER_SITE_OTHER): Payer: 59 | Admitting: Podiatry

## 2016-05-17 ENCOUNTER — Encounter: Payer: Self-pay | Admitting: Podiatry

## 2016-05-17 VITALS — BP 139/78 | HR 68

## 2016-05-17 DIAGNOSIS — M79672 Pain in left foot: Secondary | ICD-10-CM

## 2016-05-17 DIAGNOSIS — M79671 Pain in right foot: Secondary | ICD-10-CM

## 2016-05-17 DIAGNOSIS — B351 Tinea unguium: Secondary | ICD-10-CM

## 2016-05-17 DIAGNOSIS — L600 Ingrowing nail: Secondary | ICD-10-CM | POA: Diagnosis not present

## 2016-05-17 DIAGNOSIS — M79606 Pain in leg, unspecified: Secondary | ICD-10-CM

## 2016-05-17 NOTE — Patient Instructions (Signed)
Seen for hypertrophic nails. All nails debrided. Return in 3 months or as needed.  

## 2016-05-17 NOTE — Progress Notes (Signed)
Subjective: 60 year old female presents complaining of painful toe nails from being thick and ingrown on both great toes.  Objective:  All nails are thick, hard, and hypertrophic.  Discolorated nail plate 2nd bilateral.  Contracted 2nd digit bilateral.  Neurovascular status are within normal.   Assessment: Onychomycosis x 10. Microtrauma 2nd toe nail with brown discoloration bilateral. Hammer toe deformity 2 bilateral.  Pain in lower limbs.   Plan:  Reviewed findings. All nails debrided. Return as needed

## 2016-05-28 ENCOUNTER — Other Ambulatory Visit (HOSPITAL_BASED_OUTPATIENT_CLINIC_OR_DEPARTMENT_OTHER): Payer: Self-pay | Admitting: Obstetrics and Gynecology

## 2016-05-28 DIAGNOSIS — Z1231 Encounter for screening mammogram for malignant neoplasm of breast: Secondary | ICD-10-CM

## 2016-06-09 ENCOUNTER — Encounter: Payer: Self-pay | Admitting: Family Medicine

## 2016-06-10 ENCOUNTER — Ambulatory Visit (HOSPITAL_BASED_OUTPATIENT_CLINIC_OR_DEPARTMENT_OTHER)
Admission: RE | Admit: 2016-06-10 | Discharge: 2016-06-10 | Disposition: A | Discharge status: Home / Self Care | Payer: 59 | Source: Ambulatory Visit | Attending: Obstetrics and Gynecology | Admitting: Obstetrics and Gynecology

## 2016-06-10 DIAGNOSIS — Z1231 Encounter for screening mammogram for malignant neoplasm of breast: Secondary | ICD-10-CM | POA: Diagnosis not present

## 2016-06-14 ENCOUNTER — Encounter: Payer: Self-pay | Admitting: Family Medicine

## 2016-06-14 ENCOUNTER — Other Ambulatory Visit: Payer: Self-pay | Admitting: Family Medicine

## 2016-06-14 DIAGNOSIS — K219 Gastro-esophageal reflux disease without esophagitis: Secondary | ICD-10-CM

## 2016-06-14 MED ORDER — PANTOPRAZOLE SODIUM 40 MG PO TBEC: 40.0000 mg | DELAYED_RELEASE_TABLET | Freq: Every day | ORAL | 3 refills | Status: DC

## 2016-06-14 MED FILL — PANTOPRAZOLE SOD DR 40 MG T: 40 | 90 days supply | Qty: 90 | Fill #0

## 2016-06-15 ENCOUNTER — Other Ambulatory Visit: Payer: Self-pay | Admitting: Family Medicine

## 2016-06-15 ENCOUNTER — Encounter: Payer: Self-pay | Admitting: Family Medicine

## 2016-06-15 DIAGNOSIS — Z9884 Bariatric surgery status: Secondary | ICD-10-CM

## 2016-06-15 DIAGNOSIS — K219 Gastro-esophageal reflux disease without esophagitis: Secondary | ICD-10-CM

## 2016-06-16 ENCOUNTER — Encounter: Payer: Self-pay | Admitting: Family Medicine

## 2016-06-23 DIAGNOSIS — K219 Gastro-esophageal reflux disease without esophagitis: Secondary | ICD-10-CM | POA: Diagnosis not present

## 2016-06-24 MED FILL — CARAFATE 1 GM/10 ML SUSP: 1 | 30 days supply | Qty: 300 | Fill #0

## 2016-07-15 DIAGNOSIS — H43391 Other vitreous opacities, right eye: Secondary | ICD-10-CM | POA: Diagnosis not present

## 2016-07-15 DIAGNOSIS — H35032 Hypertensive retinopathy, left eye: Secondary | ICD-10-CM | POA: Diagnosis not present

## 2016-07-15 DIAGNOSIS — H04123 Dry eye syndrome of bilateral lacrimal glands: Secondary | ICD-10-CM | POA: Diagnosis not present

## 2016-07-15 DIAGNOSIS — E119 Type 2 diabetes mellitus without complications: Secondary | ICD-10-CM | POA: Diagnosis not present

## 2016-07-15 DIAGNOSIS — H2513 Age-related nuclear cataract, bilateral: Secondary | ICD-10-CM | POA: Diagnosis not present

## 2016-07-23 DIAGNOSIS — K219 Gastro-esophageal reflux disease without esophagitis: Secondary | ICD-10-CM | POA: Diagnosis not present

## 2016-08-17 ENCOUNTER — Other Ambulatory Visit: Payer: Self-pay | Admitting: Family Medicine

## 2016-08-17 ENCOUNTER — Ambulatory Visit (INDEPENDENT_AMBULATORY_CARE_PROVIDER_SITE_OTHER): Payer: 59 | Admitting: Podiatry

## 2016-08-17 VITALS — BP 132/72 | HR 68

## 2016-08-17 DIAGNOSIS — M79672 Pain in left foot: Secondary | ICD-10-CM

## 2016-08-17 DIAGNOSIS — M79671 Pain in right foot: Secondary | ICD-10-CM | POA: Diagnosis not present

## 2016-08-17 DIAGNOSIS — M5432 Sciatica, left side: Secondary | ICD-10-CM | POA: Diagnosis not present

## 2016-08-17 DIAGNOSIS — M2040 Other hammer toe(s) (acquired), unspecified foot: Secondary | ICD-10-CM

## 2016-08-17 DIAGNOSIS — L600 Ingrowing nail: Secondary | ICD-10-CM | POA: Diagnosis not present

## 2016-08-17 DIAGNOSIS — S335XXD Sprain of ligaments of lumbar spine, subsequent encounter: Secondary | ICD-10-CM | POA: Diagnosis not present

## 2016-08-17 DIAGNOSIS — B351 Tinea unguium: Secondary | ICD-10-CM

## 2016-08-17 DIAGNOSIS — M545 Low back pain: Secondary | ICD-10-CM | POA: Diagnosis not present

## 2016-08-17 DIAGNOSIS — M5136 Other intervertebral disc degeneration, lumbar region: Secondary | ICD-10-CM | POA: Diagnosis not present

## 2016-08-17 MED FILL — OLMESARTAN MEDOXOMIL 5 MG T: 5 | 90 days supply | Qty: 90 | Fill #0

## 2016-08-17 MED FILL — CITALOPRAM HBR 20 MG TABLET: 20 | 90 days supply | Qty: 90 | Fill #0

## 2016-08-17 MED FILL — POTASSIUM CL 10 MEQ TAB SA: 10 | 90 days supply | Qty: 90 | Fill #1

## 2016-08-17 MED FILL — ROSUVASTATIN CALCIUM 20 MG: 20 | 90 days supply | Qty: 90 | Fill #1

## 2016-08-17 MED FILL — raNITIdine HCL 150 MG TABS: 150 | 90 days supply | Qty: 180 | Fill #1

## 2016-08-17 MED FILL — GABAPENTIN 300 MG CAPSULE: 300 | 30 days supply | Qty: 90 | Fill #0

## 2016-08-17 MED FILL — FUROSEMIDE 20 MG TABLET: 20 | 90 days supply | Qty: 90 | Fill #1

## 2016-08-17 MED FILL — METHOCARBAMOL 500 MG TABLET: 500 | 10 days supply | Qty: 30 | Fill #0

## 2016-08-17 NOTE — Progress Notes (Signed)
Subjective: 61year old female presents complaining of painful toe nails from ingrown on both great toes.  Objective:  All nails are thick, hard, and hypertrophic.  Ingrown hallucal nails without open skin. Neurovascular status are within normal.   Assessment: Onychomycosis x 10. Symptomatic ingrown hallucal nail on both great toe. Hammer toe deformity 2 bilateral.  Pain in lower limbs.   Plan:  Reviewed findings. All nails debrided. Return as needed

## 2016-08-17 NOTE — Patient Instructions (Signed)
Seen for hypertrophic nails and thick keratotic tissue build up at distal end of 2nd right. All nails and keratotic lesions debrided.No abnormal findings noted. Return in 3 months or as needed.

## 2016-08-18 ENCOUNTER — Other Ambulatory Visit: Payer: Self-pay | Admitting: Specialist

## 2016-08-18 ENCOUNTER — Encounter: Payer: Self-pay | Admitting: Podiatry

## 2016-08-18 DIAGNOSIS — M5136 Other intervertebral disc degeneration, lumbar region: Secondary | ICD-10-CM

## 2016-08-18 MED FILL — PREMARIN VAGINAL CREAM-APPL: 0.625 | 30 days supply | Qty: 30 | Fill #0

## 2016-08-18 MED FILL — CYCLOBENZAPRINE 10 MG TAB: 10 | 10 days supply | Qty: 30 | Fill #0

## 2016-08-18 MED FILL — ESTRADIOL 0.1 MG PATCH: 0.1 | 84 days supply | Qty: 24 | Fill #0

## 2016-09-03 ENCOUNTER — Other Ambulatory Visit: Payer: Self-pay

## 2016-09-03 ENCOUNTER — Ambulatory Visit
Admission: RE | Admit: 2016-09-03 | Discharge: 2016-09-03 | Disposition: A | Discharge status: Home / Self Care | Payer: 59 | Source: Ambulatory Visit | Attending: Specialist | Admitting: Specialist

## 2016-09-03 DIAGNOSIS — M5136 Other intervertebral disc degeneration, lumbar region: Secondary | ICD-10-CM

## 2016-09-03 DIAGNOSIS — M47817 Spondylosis without myelopathy or radiculopathy, lumbosacral region: Secondary | ICD-10-CM | POA: Diagnosis not present

## 2016-09-03 MED ORDER — IOPAMIDOL (ISOVUE-M 200) INJECTION 41%
1.0000 mL | Freq: Once | INTRAMUSCULAR | Status: AC
  Administered 2016-09-03: 1 mL via EPIDURAL

## 2016-09-03 MED ORDER — TRIAMCINOLONE ACETONIDE 40 MG/ML IJ SUSP (RADIOLOGY)
60.0000 mg | Freq: Once | INTRAMUSCULAR | Status: AC
Start: 2016-09-03 — End: 2016-09-03
  Administered 2016-09-03: 60 mg via EPIDURAL

## 2016-09-03 MED ORDER — HYDROCODONE-ACETAMINOPHEN 7.5-325 MG/15ML PO SOLN: 15.0000 mL | Freq: Four times a day (QID) | ORAL | 0 refills | Status: DC | PRN

## 2016-09-03 MED FILL — HYDROCOD-APAP 7.5-325/15ML: 7.5-325 | 2 days supply | Qty: 120 | Fill #0

## 2016-09-03 NOTE — Telephone Encounter (Signed)
Per Dr. Jola Baptist, he wrote the patient a prescription for the medication. When the patient went to fill it at her pharmacy they stated it was not written correctly. Patient went back to dr. Jola Baptist who stated per the patient  "his rx pad was locked up and the nurse was gone home for the day" She was then asked to bring the rx to her PCP, who then filled it. The rx came up with Contraindications, patient was notified, ans she stated she has taking this med before and it was ok. rx was printed signed and giving to the patient.  PC

## 2016-09-03 NOTE — Discharge Instructions (Signed)

## 2016-09-06 DIAGNOSIS — Z6828 Body mass index (BMI) 28.0-28.9, adult: Secondary | ICD-10-CM | POA: Diagnosis not present

## 2016-09-06 DIAGNOSIS — Z01411 Encounter for gynecological examination (general) (routine) with abnormal findings: Secondary | ICD-10-CM | POA: Diagnosis not present

## 2016-09-06 DIAGNOSIS — N9419 Other specified dyspareunia: Secondary | ICD-10-CM | POA: Diagnosis not present

## 2016-09-06 DIAGNOSIS — N951 Menopausal and female climacteric states: Secondary | ICD-10-CM | POA: Diagnosis not present

## 2016-09-06 DIAGNOSIS — N959 Unspecified menopausal and perimenopausal disorder: Secondary | ICD-10-CM | POA: Diagnosis not present

## 2016-09-08 ENCOUNTER — Emergency Department
Admission: EM | Admit: 2016-09-08 | Discharge: 2016-09-08 | Disposition: A | Discharge status: Home / Self Care | Payer: 59 | Source: Home / Self Care | Attending: Family Medicine | Admitting: Family Medicine

## 2016-09-08 DIAGNOSIS — R5383 Other fatigue: Secondary | ICD-10-CM | POA: Diagnosis not present

## 2016-09-08 DIAGNOSIS — R509 Fever, unspecified: Secondary | ICD-10-CM | POA: Diagnosis not present

## 2016-09-08 DIAGNOSIS — J3489 Other specified disorders of nose and nasal sinuses: Secondary | ICD-10-CM | POA: Diagnosis not present

## 2016-09-08 DIAGNOSIS — R6889 Other general symptoms and signs: Secondary | ICD-10-CM

## 2016-09-08 DIAGNOSIS — R05 Cough: Secondary | ICD-10-CM | POA: Diagnosis not present

## 2016-09-08 NOTE — ED Triage Notes (Signed)
Began Monday night with sore throat.  Tuesday chills, fever, then started coughing, productive- yellowish.  Nausea today.

## 2016-09-08 NOTE — ED Provider Notes (Signed)
CSN: [id number]     Arrival date & time 09/08/16  1751 History   First MD Initiated Contact with Patient 09/08/16 1812     Chief Complaint  Patient presents with  . Sore Throat  . Cough  . Fever  . Generalized Body Aches   (Consider location/radiation/quality/duration/timing/severity/associated sxs/prior Treatment) HPI [name] is a 61 y.o. female presenting to UC with c/o sudden onset body aches, fatigue, fever Tmax 100.5*F, mild cough with congestion.  Symptoms started about 2 days ago after she had a routine f/u with her GYN. 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  Denies n/v/d.  She did get the flu vaccine this year.    Past Medical History:  Diagnosis Date  . Allergic rhinitis   . Anxiety   . Asthma    hx bronchial asthma at times with upper resp infection  . Depression   . Diabetes mellitus without complication ([place])   . GERD (gastroesophageal reflux disease)   . Headache(784.0)    migraine and cluster headaches  . Hyperlipemia   . Hypertension   . IBS (irritable bowel syndrome)   . Menopause   . Neuromuscular disorder ([place]) 2009   hx of fibromyalgia, polyarthralsia (surgery induced)  . Stress incontinence    at times   Past Surgical History:  Procedure Laterality Date  . ABDOMINAL HYSTERECTOMY  12/1999  . CERVICAL CONIZATION W/BX  june 1990   dysplasia of cervix/used 5Fu cream for 3 months  . CERVICAL LAMINECTOMY  2005 & 2009   X 2   NO ROM PROBLEMS  . [place]  . DILATION AND CURETTAGE OF UTERUS   [phone]   missed abortion  . ESOPHAGOGASTRODUODENOSCOPY  06/29/2011   Procedure: ESOPHAGOGASTRODUODENOSCOPY (EGD);  Surgeon: Shann Medal, MD;  Location: [facility];  Service: General;  Laterality: N/A;  . FOOT SURGERY  2005   RT HEEL  . GASTRIC BANDING PORT REVISION  09/24/2011   Procedure: GASTRIC BANDING PORT REVISION;  Surgeon: Pedro Earls, MD;   Location: [facility];  Service: General;  Laterality: N/A;  . HYSTEROSCOPY  1999  . LAPAROSCOPIC GASTRIC BANDING  12/30/09  . LAPAROSCOPIC GASTRIC SLEEVE RESECTION N/A 07/02/2013   Procedure: LAPAROSCOPIC GASTRIC SLEEVE RESECTION upper endoscopy;  Surgeon: Pedro Earls, MD;  Location: [facility];  Service: General;  Laterality: N/A;  . LAPAROSCOPIC REPAIR AND REMOVAL OF GASTRIC BAND N/A 07/02/2013   Procedure: LAPAROSCOPIC REMOVAL OF GASTRIC BAND;  Surgeon: Pedro Earls, MD;  Location: [facility];  Service: General;  Laterality: N/A;  . LAPAROSCOPIC REVISION OF GASTRIC BAND  07/03/2012   Procedure: LAPAROSCOPIC REVISION OF GASTRIC BAND;  Surgeon: Pedro Earls, MD;  Location: [facility];  Service: General;  Laterality: N/A;  removal of old lap. band port, replaced with AP standard band  . LAPAROSCOPY  09/24/2011   Procedure: LAPAROSCOPY DIAGNOSTIC;  Surgeon: Pedro Earls, MD;  Location: [facility];  Service: General;  Laterality: N/A;  . LAPAROSCOPY  07/03/2012   Procedure: LAPAROSCOPY DIAGNOSTIC;  Surgeon: Pedro Earls, MD;  Location: [facility];  Service: General;  Laterality: N/A;  Exploratory Laparoscopy   . MESH APPLIED TO LAP PORT  07/03/2012   Procedure: MESH APPLIED TO LAP PORT;  Surgeon: Pedro Earls, MD;  Location: [facility];  Service: General;  Laterality: N/A;  . [place]  1997   X 2  . right knee  1981   ARTHROSCOPY AND ARTHROTOMY  . right knee arthroscopy and arthrotomy  12-1979  . TONSILLECTOMY  1971  . TUBAL LIGATION  1993   WITH C -SECTION   Family History  Problem Relation Age of Onset  . Diabetes Mother   . Stroke Mother   . Breast cancer Mother   . Atrial fibrillation Mother   . Hypertension Mother   . Cancer Mother     breast  . Diabetes Father   . Stroke Father   . Hypertension Father   . Heart attack Father   . Thyroid disease    . Heart disease     Social History  Substance Use Topics  . Smoking status: Never Smoker  . Smokeless tobacco:  Never Used  . Alcohol use No   OB History    Gravida Para Term Preterm AB Living   3 0 0 0 1 2   SAB TAB Ectopic Multiple Live Births   1 0 0 0       Review of Systems  Constitutional: Positive for appetite change, chills, fatigue and fever.  HENT: Positive for congestion, rhinorrhea and sore throat. Negative for ear pain, trouble swallowing and voice change.   Respiratory: Positive for cough. Negative for shortness of breath.   Cardiovascular: Negative for chest pain and palpitations.  Gastrointestinal: Negative for abdominal pain, diarrhea, nausea and vomiting.  Musculoskeletal: Negative for arthralgias, back pain and myalgias.  Skin: Negative for rash.    Allergies  Isoniazid; Nitrofurantoin; Hctz [hydrochlorothiazide]; Promethazine hcl [promethazine hcl]; Tamiflu [oseltamivir phosphate]; Macrolides and ketolides; Morphine; Percocet [oxycodone-acetaminophen]; Roxicet [oxycodone-acetaminophen]; Sulfamethoxazole-trimethoprim; and Fentanyl  Home Medications   Prior to Admission medications   Medication Sig Start Date End Date Taking? Authorizing Provider  amoxicillin-clavulanate (AUGMENTIN) 875-125 MG tablet Take 1 tablet by mouth 2 (two) times daily. One po bid x 7 days 03/19/16   Noland Fordyce, PA-C  Blood Glucose Monitoring Suppl (BAYER CONTOUR LINK MONITOR) W/DEVICE KIT Test blood sugar once daily DX code: E11.9 07/18/14   [name] Chase, DO  Ca Phosphate-Cholecalciferol (CALTRATE GUMMY BITES PO) Take 2 tablets by mouth at bedtime.    Historical Provider, MD  citalopram (CELEXA) 20 MG tablet Take 1 tablet (20 mg total) by mouth every evening. 02/27/16   [name] Chase, DO  Cyanocobalamin 500 MCG/0.1ML SOLN 1 spray in one  nostril weekly 03/01/16   Alferd Apa Lowne Chase, DO  cyclobenzaprine (FLEXERIL) 10 MG tablet TAKE 1 TABLET (10 MG TOTAL) BY MOUTH 3 TIMES DAILY AS NEEDED FOR MUSCLE SPASMS. 08/17/16   Alferd Apa Lowne Chase, DO  Efinaconazole (JUBLIA) 10 % SOLN Apply 1  applicator topically every morning. 08/21/14   [name], DPM  estradiol (VIVELLE-DOT) 0.1 MG/24HR Place 1 patch (0.1 mg total) onto the skin 2 (two) times a week. Monday and Thursday 06/27/12   [name], MD  furosemide (LASIX) 20 MG tablet TAKE 1 TABLET (20 MG TOTAL) BY MOUTH EVERY MORNING. 02/27/16   [name] Chase, DO  glucose blood [facility] VERIO) test strip Check blood sugar daily 02/27/16   [name] Chase, DO  glucose blood test strip Test blood sugar once daily. Dx Code: E11.9 07/31/15   [name] Chase, DO  HYDROcodone-acetaminophen (HYCET) 7.5-325 mg/15 ml solution Take 15 mLs by mouth 4 (four) times daily as needed for moderate pain. 09/03/16   [name] Chase, DO  loratadine (  CLARITIN) 10 MG tablet Take 10 mg by mouth every evening.     Historical Provider, MD  olmesartan (BENICAR) 5 MG tablet Take 1 tablet (5 mg total) by mouth daily. 02/27/16   [name] Chase, DO  [facility] DELICA LANCETS FINE MISC check blood sugar daily 07/16/14   Alferd Apa Lowne Chase, DO  pantoprazole (PROTONIX) 40 MG tablet Take 1 tablet (40 mg total) by mouth daily. 06/14/16   [name], DO  Pediatric Multivit-Minerals-C (ONE-A-DAY SCOOBY-DOO GUMMIES PO) Take 1 tablet by mouth daily.    Historical Provider, MD  potassium chloride (K-DUR,KLOR-CON) 10 MEQ tablet TAKE 1 TABLET (10 MEQ TOTAL) BY MOUTH DAILY. 02/27/16   [name] Chase, DO  PREMARIN vaginal cream Apply 1 application topically 2 (two) times a week. 01/05/16   Historical Provider, MD  promethazine-codeine (PHENERGAN WITH CODEINE) 6.25-10 MG/5ML syrup Take 5 mLs by mouth every 8 (eight) hours as needed for cough. 03/19/16   Noland Fordyce, PA-C  ranitidine (ZANTAC) 150 MG tablet Take 1 tablet (150 mg total) by mouth 2 (two) times daily. 02/27/16   [name] Chase, DO  rosuvastatin (CRESTOR) 20 MG tablet TAKE 1 TABLET (20 MG TOTAL) BY MOUTH AT BEDTIME. 02/27/16   [name] Chase, DO   Meds Ordered and  Administered this Visit  Medications - No data to display  BP 172/82 (BP Location: Left Arm)   Pulse 84   Temp 98.5 F (36.9 C) (Oral)   Ht '5\' 11"'  (1.803 m)   Wt 198 lb (89.8 kg)   SpO2 95%   BMI 27.62 kg/m  No data found.   Physical Exam  Constitutional: She is oriented to person, place, and time. She appears well-developed and well-nourished. No distress.  HENT:  Head: Normocephalic and atraumatic.  Right Ear: Tympanic membrane normal.  Left Ear: Tympanic membrane normal.  Eyes: EOM are normal.  Neck: Normal range of motion.  Cardiovascular: Normal rate.   Pulmonary/Chest: Effort normal.  Musculoskeletal: Normal range of motion.  Neurological: She is alert and oriented to person, place, and time.  Skin: Skin is warm and dry. She is not diaphoretic.  Psychiatric: She has a normal mood and affect. Her behavior is normal.  Nursing note and vitals reviewed.   Urgent Care Course     Procedures (including critical care time)  Labs Review Labs Reviewed - No data to display  Imaging Review No results found.    MDM   1. Flu-like symptoms    Hx and exam c/w influenza w/o evidence of underlying bacterial infection at this time.  Discussed risks/benefits of Tamiflu. Pt states she cannot have Tamiflu because last year, she vomited within 30 minutes of taking it and was advised she could not take it anymore.  Encouraged fluids, rest, acetaminophen and ibuprofen.  Encouraged f/u with PCP in 1 week if not improving, sooner if worsening. Pt notes she has previously scheduled f/u with PCP on Friday, 09/10/16.  Encouraged to keep that appointment.     Noland Fordyce, PA-C 09/08/16 1831

## 2016-09-08 NOTE — Discharge Instructions (Signed)

## 2016-09-10 ENCOUNTER — Encounter: Payer: Self-pay | Admitting: Family Medicine

## 2016-09-10 ENCOUNTER — Ambulatory Visit (INDEPENDENT_AMBULATORY_CARE_PROVIDER_SITE_OTHER): Payer: 59 | Admitting: Family Medicine

## 2016-09-10 VITALS — BP 119/69 | HR 69 | Temp 98.2°F | Wt 195.0 lb

## 2016-09-10 DIAGNOSIS — E119 Type 2 diabetes mellitus without complications: Secondary | ICD-10-CM | POA: Diagnosis not present

## 2016-09-10 DIAGNOSIS — I10 Essential (primary) hypertension: Secondary | ICD-10-CM

## 2016-09-10 DIAGNOSIS — E785 Hyperlipidemia, unspecified: Secondary | ICD-10-CM | POA: Diagnosis not present

## 2016-09-10 DIAGNOSIS — J209 Acute bronchitis, unspecified: Secondary | ICD-10-CM | POA: Diagnosis not present

## 2016-09-10 DIAGNOSIS — J40 Bronchitis, not specified as acute or chronic: Secondary | ICD-10-CM

## 2016-09-10 LAB — CBC WITH DIFFERENTIAL/PLATELET
Basophils Absolute: 0.1 10*3/uL (ref 0.0–0.1)
Basophils Relative: 0.9 % (ref 0.0–3.0)
Eosinophils Absolute: 0.4 10*3/uL (ref 0.0–0.7)
Eosinophils Relative: 4.2 % (ref 0.0–5.0)
HCT: 41.8 % (ref 36.0–46.0)
Hemoglobin: 14 g/dL (ref 12.0–15.0)
Lymphocytes Relative: 25.3 % (ref 12.0–46.0)
Lymphs Abs: 2.5 10*3/uL (ref 0.7–4.0)
MCHC: 33.4 g/dL (ref 30.0–36.0)
MCV: 85.8 fl (ref 78.0–100.0)
Monocytes Absolute: 0.9 10*3/uL (ref 0.1–1.0)
Monocytes Relative: 9.1 % (ref 3.0–12.0)
Neutro Abs: 6 10*3/uL (ref 1.4–7.7)
Neutrophils Relative %: 60.5 % (ref 43.0–77.0)
Platelets: 260 10*3/uL (ref 150.0–400.0)
RBC: 4.87 Mil/uL (ref 3.87–5.11)
RDW: 13.9 % (ref 11.5–15.5)
WBC: 10 10*3/uL (ref 4.0–10.5)

## 2016-09-10 LAB — LIPID PANEL
Cholesterol: 147 mg/dL (ref 0–200)
HDL: 61.3 mg/dL (ref >39.00)
LDL Cholesterol: 72 mg/dL (ref 0–99)
NonHDL: 85.33
Total CHOL/HDL Ratio: 2
Triglycerides: 66 mg/dL (ref 0.0–149.0)
VLDL: 13.2 mg/dL (ref 0.0–40.0)

## 2016-09-10 LAB — COMPREHENSIVE METABOLIC PANEL
ALT: 16 U/L (ref 0–35)
AST: 13 U/L (ref 0–37)
Albumin: 4.3 g/dL (ref 3.5–5.2)
Alkaline Phosphatase: 58 U/L (ref 39–117)
BUN: 17 mg/dL (ref 6–23)
CO2: 35 mEq/L — ABNORMAL HIGH (ref 19–32)
Calcium: 9.6 mg/dL (ref 8.4–10.5)
Chloride: 98 mEq/L (ref 96–112)
Creatinine, Ser: 0.88 mg/dL (ref 0.40–1.20)
GFR: 69.74 mL/min (ref >60.00)
Glucose, Bld: 98 mg/dL (ref 70–99)
Potassium: 4.3 mEq/L (ref 3.5–5.1)
Sodium: 138 mEq/L (ref 135–145)
Total Bilirubin: 0.6 mg/dL (ref 0.2–1.2)
Total Protein: 7.8 g/dL (ref 6.0–8.3)

## 2016-09-10 LAB — HEMOGLOBIN A1C: Hgb A1c MFr Bld: 6.1 % (ref 4.6–6.5)

## 2016-09-10 MED ORDER — ROSUVASTATIN CALCIUM 20 MG PO TABS: ORAL_TABLET | ORAL | 1 refills | Status: DC

## 2016-09-10 MED ORDER — PROMETHAZINE-CODEINE 6.25-10 MG/5ML PO SYRP: 5.0000 mL | ORAL_SOLUTION | Freq: Three times a day (TID) | ORAL | 0 refills | Status: DC | PRN

## 2016-09-10 MED ORDER — OLMESARTAN MEDOXOMIL 5 MG PO TABS: 5.0000 mg | ORAL_TABLET | Freq: Every day | ORAL | 3 refills | Status: DC

## 2016-09-10 MED ORDER — FUROSEMIDE 20 MG PO TABS: ORAL_TABLET | ORAL | 1 refills | Status: DC

## 2016-09-10 MED ORDER — AZITHROMYCIN 250 MG PO TABS: ORAL_TABLET | ORAL | 0 refills | Status: DC

## 2016-09-10 MED ORDER — POTASSIUM CHLORIDE CRYS ER 10 MEQ PO TBCR: EXTENDED_RELEASE_TABLET | ORAL | 1 refills | Status: DC

## 2016-09-10 MED FILL — AZITHROMYCIN 250 MG TABLET: 250 | 5 days supply | Qty: 6 | Fill #0

## 2016-09-10 MED FILL — PROMETHAZINE-CODEINE SYRUP: 6.25-10 | 8 days supply | Qty: 120 | Fill #0

## 2016-09-10 NOTE — Progress Notes (Signed)
Pre visit review using our clinic review tool, if applicable. No additional management support is needed unless otherwise documented below in the visit note. 

## 2016-09-10 NOTE — Progress Notes (Signed)
Patient ID: [name], female   DOB: Sep 26, 1955, 61 y.o.   MRN: [id number]   Subjective:    Patient ID: [name], female    DOB: 1956-02-20, 61 y.o.   MRN: [id number]  Chief Complaint  Patient presents with  . Follow-up  . Cough    Cough  This is a new problem. The current episode started in the past 7 days. The problem has been unchanged. Associated symptoms include chills and a fever. Pertinent negatives include no chest pain, headaches, heartburn, myalgias, rash or shortness of breath. She has tried OTC cough suppressant and prescription cough suppressant for the symptoms. The treatment provided no relief. There is no history of environmental allergies.    Patient is in today for a six month follow up. Was seen in the Urgent Care on Wednesday, 09/08/2016. Patient has a Hx of Type II Dm, HTN, GERD. Patient states that she has been having ongoing issues with reflux. Patient had Bariatric surgery four years ago. Patient also has a complaint of a cough since Monday night. Has tried OTC and Prescribed medications. No additional acute concerns noted at this time.  HPI HYPERTENSION   Blood pressure range-not checking  Chest pain- no      Dyspnea- no Lightheadedness- no   Edema- no  Other side effects - no   Medication compliance: good Low salt diet- yes    DIABETES    Blood Sugar ranges-90-114  Polyuria- no New Visual problems- no  Hypoglycemic symptoms- no  Other side effects-no Medication compliance - good Foot exam- today   HYPERLIPIDEMIA  Medication compliance- good RUQ pain- no  Muscle aches- no Other side effects-no    I acted as a Education administrator for [employer], DO. Raiford Noble, [place]   Patient Care Team: [name], DO as PCP - General (Family Medicine) Bryson Ha Himmelrich, RD (Inactive) as Dietitian   Past Medical History:  Diagnosis Date  . Allergic rhinitis   . Anxiety   . Asthma    hx bronchial asthma at times with upper resp infection    . Depression   . Diabetes mellitus without complication ([place])   . GERD (gastroesophageal reflux disease)   . Headache(784.0)    migraine and cluster headaches  . Hyperlipemia   . Hypertension   . IBS (irritable bowel syndrome)   . Menopause   . Neuromuscular disorder ([place]) 2009   hx of fibromyalgia, polyarthralsia (surgery induced)  . Stress incontinence    at times    Past Surgical History:  Procedure Laterality Date  . ABDOMINAL HYSTERECTOMY  12/1999  . CERVICAL CONIZATION W/BX  june 1990   dysplasia of cervix/used 5Fu cream for 3 months  . CERVICAL LAMINECTOMY  2005 & 2009   X 2   NO ROM PROBLEMS  . [place]  . DILATION AND CURETTAGE OF UTERUS   [phone]   missed abortion  . ESOPHAGOGASTRODUODENOSCOPY  06/29/2011   Procedure: ESOPHAGOGASTRODUODENOSCOPY (EGD);  Surgeon: Shann Medal, MD;  Location: [facility];  Service: General;  Laterality: N/A;  . FOOT SURGERY  2005   RT HEEL  . GASTRIC BANDING PORT REVISION  09/24/2011   Procedure: GASTRIC BANDING PORT REVISION;  Surgeon: Pedro Earls, MD;  Location: [facility];  Service: General;  Laterality: N/A;  . HYSTEROSCOPY  1999  . LAPAROSCOPIC GASTRIC BANDING  12/30/09  . LAPAROSCOPIC GASTRIC SLEEVE RESECTION N/A 07/02/2013  Procedure: LAPAROSCOPIC GASTRIC SLEEVE RESECTION upper endoscopy;  Surgeon: Pedro Earls, MD;  Location: [facility];  Service: General;  Laterality: N/A;  . LAPAROSCOPIC REPAIR AND REMOVAL OF GASTRIC BAND N/A 07/02/2013   Procedure: LAPAROSCOPIC REMOVAL OF GASTRIC BAND;  Surgeon: Pedro Earls, MD;  Location: [facility];  Service: General;  Laterality: N/A;  . LAPAROSCOPIC REVISION OF GASTRIC BAND  07/03/2012   Procedure: LAPAROSCOPIC REVISION OF GASTRIC BAND;  Surgeon: Pedro Earls, MD;  Location: [facility];  Service: General;  Laterality: N/A;  removal of old lap. band port, replaced with AP standard band  . LAPAROSCOPY  09/24/2011   Procedure:  LAPAROSCOPY DIAGNOSTIC;  Surgeon: Pedro Earls, MD;  Location: [facility];  Service: General;  Laterality: N/A;  . LAPAROSCOPY  07/03/2012   Procedure: LAPAROSCOPY DIAGNOSTIC;  Surgeon: Pedro Earls, MD;  Location: [facility];  Service: General;  Laterality: N/A;  Exploratory Laparoscopy   . MESH APPLIED TO LAP PORT  07/03/2012   Procedure: MESH APPLIED TO LAP PORT;  Surgeon: Pedro Earls, MD;  Location: [facility];  Service: General;  Laterality: N/A;  . [place]   X 2  . right knee  1981   ARTHROSCOPY AND ARTHROTOMY  . right knee arthroscopy and arthrotomy  12-1979  . TONSILLECTOMY  1971  . TUBAL LIGATION  1993   WITH C -SECTION    Family History  Problem Relation Age of Onset  . Diabetes Mother   . Stroke Mother   . Breast cancer Mother   . Atrial fibrillation Mother   . Hypertension Mother   . Cancer Mother     breast  . Diabetes Father   . Stroke Father   . Hypertension Father   . Heart attack Father   . Thyroid disease    . Heart disease      Social History   Social History  . Marital status: Married    Spouse name: N/A  . Number of children: N/A  . Years of education: N/A   Occupational History  . RN [employer]   Social History Main Topics  . Smoking status: Never Smoker  . Smokeless tobacco: Never Used  . Alcohol use No  . Drug use: No  . Sexual activity: Yes    Birth control/ protection: None   Other Topics Concern  . Not on file   Social History Narrative  . No narrative on file    Outpatient Medications Prior to Visit  Medication Sig Dispense Refill  . Blood Glucose Monitoring Suppl (BAYER CONTOUR LINK MONITOR) W/DEVICE KIT Test blood sugar once daily DX code: E11.9 1 kit 1  . Ca Phosphate-Cholecalciferol (CALTRATE GUMMY BITES PO) Take 2 tablets by mouth at bedtime.    . citalopram (CELEXA) 20 MG tablet Take 1 tablet (20 mg total) by mouth every evening. 90 tablet 3  . Cyanocobalamin 500 MCG/0.1ML SOLN 1  spray in one  nostril weekly 1 Bottle 5  . cyclobenzaprine (FLEXERIL) 10 MG tablet TAKE 1 TABLET (10 MG TOTAL) BY MOUTH 3 TIMES DAILY AS NEEDED FOR MUSCLE SPASMS. 30 tablet 0  . Efinaconazole (JUBLIA) 10 % SOLN Apply 1 applicator topically every morning. 1 Bottle 3  . estradiol (VIVELLE-DOT) 0.1 MG/24HR Place 1 patch (0.1 mg total) onto the skin 2 (two) times a week. Monday and Thursday 8 patch 11  . glucose blood (ONETOUCH VERIO) test strip Check blood sugar daily 100 each 12  .  glucose blood test strip Test blood sugar once daily. Dx Code: E11.9 100 each 12  . HYDROcodone-acetaminophen (HYCET) 7.5-325 mg/15 ml solution Take 15 mLs by mouth 4 (four) times daily as needed for moderate pain. 120 mL 0  . loratadine (CLARITIN) 10 MG tablet Take 10 mg by mouth every evening.     [name] DELICA LANCETS FINE MISC check blood sugar daily 100 each 12  . pantoprazole (PROTONIX) 40 MG tablet Take 1 tablet (40 mg total) by mouth daily. 30 tablet 3  . Pediatric Multivit-Minerals-C (ONE-A-DAY SCOOBY-DOO GUMMIES PO) Take 1 tablet by mouth daily.    Marland Kitchen PREMARIN vaginal cream Apply 1 application topically 2 (two) times a week.  1  . ranitidine (ZANTAC) 150 MG tablet Take 1 tablet (150 mg total) by mouth 2 (two) times daily. 180 tablet 3  . promethazine-codeine (PHENERGAN WITH CODEINE) 6.25-10 MG/5ML syrup Take 5 mLs by mouth every 8 (eight) hours as needed for cough. 120 mL 0  . amoxicillin-clavulanate (AUGMENTIN) 875-125 MG tablet Take 1 tablet by mouth 2 (two) times daily. One po bid x 7 days (Patient not taking: Reported on 09/10/2016) 14 tablet 0  . furosemide (LASIX) 20 MG tablet TAKE 1 TABLET (20 MG TOTAL) BY MOUTH EVERY MORNING. 90 tablet 1  . olmesartan (BENICAR) 5 MG tablet Take 1 tablet (5 mg total) by mouth daily. 90 tablet 3  . potassium chloride (K-DUR,KLOR-CON) 10 MEQ tablet TAKE 1 TABLET (10 MEQ TOTAL) BY MOUTH DAILY. 90 tablet 1  . rosuvastatin (CRESTOR) 20 MG tablet TAKE 1 TABLET (20 MG TOTAL) BY  MOUTH AT BEDTIME. 90 tablet 1   No facility-administered medications prior to visit.     Allergies  Allergen Reactions  . Isoniazid     Severe SOB  . Nitrofurantoin Shortness Of Breath    REACTION: whelps  . Hctz [Hydrochlorothiazide]     rash  . Promethazine Hcl [Promethazine Hcl]     IF GIVEN  IV-hallucinations     CAN TAKE PO PHENERGAN  . Tamiflu [Oseltamivir Phosphate] Nausea And Vomiting    "I vomited within 30 minutes of taking it and was told I cannot ever take it again."  . Macrolides And Ketolides     rash  . Morphine     REACTION: severe vomiting  . Percocet [Oxycodone-Acetaminophen]     REACTION: severe itching. Can take by mouth codeine  . Roxicet [Oxycodone-Acetaminophen]     itching  . Sulfamethoxazole-Trimethoprim     Septra / Bactrim  --REACTION: rash  . Fentanyl Hives and Rash    REACTION: whelps    Review of Systems  Constitutional: Positive for chills and fever. Negative for malaise/fatigue.  HENT: Negative for congestion and hearing loss.   Eyes: Negative for blurred vision and discharge.  Respiratory: Positive for cough and sputum production. Negative for shortness of breath.   Cardiovascular: Negative for chest pain, palpitations and leg swelling.  Gastrointestinal: Negative for abdominal pain, blood in stool, constipation, diarrhea, heartburn, nausea and vomiting.  Genitourinary: Negative for dysuria, frequency, hematuria and urgency.  Musculoskeletal: Negative for back pain, falls and myalgias.  Skin: Negative for rash.  Neurological: Negative for dizziness, sensory change, loss of consciousness, weakness and headaches.  Endo/Heme/Allergies: Negative for environmental allergies. Does not bruise/bleed easily.  Psychiatric/Behavioral: Negative for depression and suicidal ideas. The patient is not nervous/anxious and does not have insomnia.        Objective:    Physical Exam  Constitutional: She is oriented to person,  place, and time. She appears  well-developed and well-nourished. No distress.  HENT:  Head: Normocephalic and atraumatic.  Right Ear: External ear normal.  Left Ear: External ear normal.  + PND + errythema  Eyes: Conjunctivae are normal. Right eye exhibits no discharge. Left eye exhibits no discharge.  Neck: Normal range of motion. No thyromegaly present.  Cardiovascular: Normal rate, regular rhythm and normal heart sounds.   No murmur heard. Pulmonary/Chest: Effort normal and breath sounds normal. No respiratory distress. She has no wheezes. She has no rales. She exhibits no tenderness.  Abdominal: Soft. Bowel sounds are normal. There is no tenderness.  Musculoskeletal: She exhibits no edema or deformity.  Lymphadenopathy:    She has cervical adenopathy.  Neurological: She is alert and oriented to person, place, and time.  Skin: Skin is warm and dry. She is not diaphoretic.  Psychiatric: She has a normal mood and affect.  Nursing note and vitals reviewed. Sensory exam of the foot is normal, tested with the monofilament. Good pulses, no lesions or ulcers, good peripheral pulses.   BP 119/69 (BP Location: Left Arm, Patient Position: Sitting, Cuff Size: Large)   Pulse 69   Temp 98.2 F (36.8 C) (Oral)   Wt 195 lb (88.5 kg)   SpO2 98% Comment: RA  BMI 27.20 kg/m  Wt Readings from Last 3 Encounters:  09/10/16 195 lb (88.5 kg)  09/08/16 198 lb (89.8 kg)  03/25/16 193 lb 3.2 oz (87.6 kg)     Lab Results  Component Value Date   WBC 10.0 09/10/2016   HGB 14.0 09/10/2016   HCT 41.8 09/10/2016   PLT 260.0 09/10/2016   GLUCOSE 98 09/10/2016   CHOL 147 09/10/2016   TRIG 66.0 09/10/2016   HDL 61.30 09/10/2016   LDLCALC 72 09/10/2016   ALT 16 09/10/2016   AST 13 09/10/2016   NA 138 09/10/2016   K 4.3 09/10/2016   CL 98 09/10/2016   CREATININE 0.88 09/10/2016   BUN 17 09/10/2016   CO2 35 (H) 09/10/2016   TSH 1.87 12/24/2013   HGBA1C 6.1 09/10/2016   MICROALBUR 0.1 08/01/2014    Lab Results    Component Value Date   TSH 1.87 12/24/2013   Lab Results  Component Value Date   WBC 10.0 09/10/2016   HGB 14.0 09/10/2016   HCT 41.8 09/10/2016   MCV 85.8 09/10/2016   PLT 260.0 09/10/2016   Lab Results  Component Value Date   NA 138 09/10/2016   K 4.3 09/10/2016   CO2 35 (H) 09/10/2016   GLUCOSE 98 09/10/2016   BUN 17 09/10/2016   CREATININE 0.88 09/10/2016   BILITOT 0.6 09/10/2016   ALKPHOS 58 09/10/2016   AST 13 09/10/2016   ALT 16 09/10/2016   PROT 7.8 09/10/2016   ALBUMIN 4.3 09/10/2016   CALCIUM 9.6 09/10/2016   GFR 69.74 09/10/2016   Lab Results  Component Value Date   CHOL 147 09/10/2016   Lab Results  Component Value Date   HDL 61.30 09/10/2016   Lab Results  Component Value Date   LDLCALC 72 09/10/2016   Lab Results  Component Value Date   TRIG 66.0 09/10/2016   Lab Results  Component Value Date   CHOLHDL 2 09/10/2016   Lab Results  Component Value Date   HGBA1C 6.1 09/10/2016       Assessment & Plan:   Problem List Items Addressed This Visit      Unprioritized   BRONCHITIS, ACUTE    z  pack con't otc meds      Essential hypertension    Stable con't meds      Relevant Medications   furosemide (LASIX) 20 MG tablet   olmesartan (BENICAR) 5 MG tablet   potassium chloride (K-DUR,KLOR-CON) 10 MEQ tablet   rosuvastatin (CRESTOR) 20 MG tablet   Other Relevant Orders   CBC with Differential/Platelet (Completed)   Hyperlipidemia LDL goal <100    Check labs      Relevant Medications   furosemide (LASIX) 20 MG tablet   olmesartan (BENICAR) 5 MG tablet   rosuvastatin (CRESTOR) 20 MG tablet    Other Visit Diagnoses    Controlled type 2 diabetes mellitus without complication, without long-term current use of insulin (HCC)    -  Primary   Relevant Medications   olmesartan (BENICAR) 5 MG tablet   rosuvastatin (CRESTOR) 20 MG tablet   Other Relevant Orders   Hemoglobin A1c (Completed)   CBC with Differential/Platelet (Completed)    Comprehensive metabolic panel (Completed)   Hyperlipidemia, unspecified hyperlipidemia type       Relevant Medications   furosemide (LASIX) 20 MG tablet   olmesartan (BENICAR) 5 MG tablet   rosuvastatin (CRESTOR) 20 MG tablet   Other Relevant Orders   Lipid panel (Completed)   CBC with Differential/Platelet (Completed)   Bronchitis       Relevant Medications   azithromycin (ZITHROMAX Z-PAK) 250 MG tablet   promethazine-codeine (PHENERGAN WITH CODEINE) 6.25-10 MG/5ML syrup      I have discontinued [name]'s amoxicillin-clavulanate. I am also having her start on azithromycin. Additionally, I am having her maintain her loratadine, Ca Phosphate-Cholecalciferol (CALTRATE GUMMY BITES PO), Pediatric Multivit-Minerals-C (ONE-A-DAY SCOOBY-DOO GUMMIES PO), estradiol, ONETOUCH DELICA LANCETS FINE, BAYER CONTOUR LINK MONITOR, Efinaconazole, glucose blood, PREMARIN, glucose blood, citalopram, ranitidine, Cyanocobalamin, pantoprazole, cyclobenzaprine, HYDROcodone-acetaminophen, furosemide, olmesartan, potassium chloride, rosuvastatin, and promethazine-codeine.  Meds ordered this encounter  Medications  . furosemide (LASIX) 20 MG tablet    Sig: TAKE 1 TABLET (20 MG TOTAL) BY MOUTH EVERY MORNING.    Dispense:  90 tablet    Refill:  1  . olmesartan (BENICAR) 5 MG tablet    Sig: Take 1 tablet (5 mg total) by mouth daily.    Dispense:  90 tablet    Refill:  3  . potassium chloride (K-DUR,KLOR-CON) 10 MEQ tablet    Sig: TAKE 1 TABLET (10 MEQ TOTAL) BY MOUTH DAILY.    Dispense:  90 tablet    Refill:  1  . rosuvastatin (CRESTOR) 20 MG tablet    Sig: TAKE 1 TABLET (20 MG TOTAL) BY MOUTH AT BEDTIME.    Dispense:  90 tablet    Refill:  1  . azithromycin (ZITHROMAX Z-PAK) 250 MG tablet    Sig: As directed    Dispense:  6 each    Refill:  0  . promethazine-codeine (PHENERGAN WITH CODEINE) 6.25-10 MG/5ML syrup    Sig: Take 5 mLs by mouth every 8 (eight) hours as needed for cough.    Dispense:   120 mL    Refill:  0    CMA served as scribe during this visit. History, Physical and Plan performed by medical provider. Documentation and orders reviewed and attested to.  [name], DO

## 2016-09-10 NOTE — Assessment & Plan Note (Signed)
z pack con't otc meds

## 2016-09-10 NOTE — Patient Instructions (Signed)
Carbohydrate Counting for Diabetes Mellitus, Adult Carbohydrate counting is a method for keeping track of how many carbohydrates you eat. Eating carbohydrates naturally increases the amount of sugar (glucose) in the blood. Counting how many carbohydrates you eat helps keep your blood glucose within normal limits, which helps you manage your diabetes (diabetes mellitus). It is important to know how many carbohydrates you can safely have in each meal. This is different for every person. A diet and nutrition specialist (registered dietitian) can help you make a meal plan and calculate how many carbohydrates you should have at each meal and snack. Carbohydrates are found in the following foods:  Grains, such as breads and cereals.  Dried beans and soy products.  Starchy vegetables, such as potatoes, peas, and corn.  Fruit and fruit juices.  Milk and yogurt.  Sweets and snack foods, such as cake, cookies, candy, chips, and soft drinks. How do I count carbohydrates? There are two ways to count carbohydrates in food. You can use either of the methods or a combination of both. Reading "Nutrition Facts" on packaged food  The "Nutrition Facts" list is included on the labels of almost all packaged foods and beverages in the [place] It includes:  The serving size.  Information about nutrients in each serving, including the grams (g) of carbohydrate per serving. To use the "Nutrition Facts":  Decide how many servings you will have.  Multiply the number of servings by the number of carbohydrates per serving.  The resulting number is the total amount of carbohydrates that you will be having. Learning standard serving sizes of other foods  When you eat foods containing carbohydrates that are not packaged or do not include "Nutrition Facts" on the label, you need to measure the servings in order to count the amount of carbohydrates:  Measure the foods that you will eat with a food scale or measuring  cup, if needed.  Decide how many standard-size servings you will eat.  Multiply the number of servings by 15. Most carbohydrate-rich foods have about 15 g of carbohydrates per serving.  For example, if you eat 8 oz (170 g) of strawberries, you will have eaten 2 servings and 30 g of carbohydrates (2 servings x 15 g = 30 g).  For foods that have more than one food mixed, such as soups and casseroles, you must count the carbohydrates in each food that is included. The following list contains standard serving sizes of common carbohydrate-rich foods. Each of these servings has about 15 g of carbohydrates:   hamburger bun or  English muffin.   oz (15 mL) syrup.   oz (14 g) jelly.  1 slice of bread.  1 six-inch tortilla.  3 oz (85 g) cooked rice or pasta.  4 oz (113 g) cooked dried beans.  4 oz (113 g) starchy vegetable, such as peas, corn, or potatoes.  4 oz (113 g) hot cereal.  4 oz (113 g) mashed potatoes or  of a large baked potato.  4 oz (113 g) canned or frozen fruit.  4 oz (120 mL) fruit juice.  4-6 crackers.  6 chicken nuggets.  6 oz (170 g) unsweetened dry cereal.  6 oz (170 g) plain fat-free yogurt or yogurt sweetened with artificial sweeteners.  8 oz (240 mL) milk.  8 oz (170 g) fresh fruit or one small piece of fruit.  24 oz (680 g) popped popcorn. Example of carbohydrate counting Sample meal  3 oz (85 g) chicken breast.  6 oz (  170 g) brown rice.  4 oz (113 g) corn.  8 oz (240 mL) milk.  8 oz (170 g) strawberries with sugar-free whipped topping. Carbohydrate calculation 1. Identify the foods that contain carbohydrates:  Rice.  Corn.  Milk.  Strawberries. 2. Calculate how many servings you have of each food:  2 servings rice.  1 serving corn.  1 serving milk.  1 serving strawberries. 3. Multiply each number of servings by 15 g:  2 servings rice x 15 g = 30 g.  1 serving corn x 15 g = 15 g.  1 serving milk x 15 g = 15  g.  1 serving strawberries x 15 g = 15 g. 4. Add together all of the amounts to find the total grams of carbohydrates eaten:  30 g + 15 g + 15 g + 15 g = 75 g of carbohydrates total. This information is not intended to replace advice given to you by your health care provider. Make sure you discuss any questions you have with your health care provider. Document Released: 06/28/2005 Document Revised: 01/16/2016 Document Reviewed: 12/10/2015 Elsevier Interactive Patient Education  2017 [employer]  

## 2016-09-10 NOTE — Assessment & Plan Note (Signed)
Stable con't meds 

## 2016-09-10 NOTE — Assessment & Plan Note (Signed)
Check labs 

## 2016-09-24 MED FILL — METOCLOPRAMIDE 5 MG TABLET: 5 | 30 days supply | Qty: 30 | Fill #0

## 2016-10-14 ENCOUNTER — Emergency Department (HOSPITAL_BASED_OUTPATIENT_CLINIC_OR_DEPARTMENT_OTHER): Payer: 59

## 2016-10-14 ENCOUNTER — Emergency Department (HOSPITAL_BASED_OUTPATIENT_CLINIC_OR_DEPARTMENT_OTHER)
Admission: EM | Admit: 2016-10-14 | Discharge: 2016-10-14 | Disposition: A | Discharge status: Home / Self Care | Payer: 59 | Attending: Physician Assistant | Admitting: Physician Assistant

## 2016-10-14 ENCOUNTER — Encounter (HOSPITAL_BASED_OUTPATIENT_CLINIC_OR_DEPARTMENT_OTHER): Payer: Self-pay | Admitting: *Deleted

## 2016-10-14 DIAGNOSIS — S8001XA Contusion of right knee, initial encounter: Secondary | ICD-10-CM | POA: Insufficient documentation

## 2016-10-14 DIAGNOSIS — M7989 Other specified soft tissue disorders: Secondary | ICD-10-CM | POA: Diagnosis not present

## 2016-10-14 DIAGNOSIS — S8991XA Unspecified injury of right lower leg, initial encounter: Secondary | ICD-10-CM

## 2016-10-14 DIAGNOSIS — Y999 Unspecified external cause status: Secondary | ICD-10-CM | POA: Insufficient documentation

## 2016-10-14 DIAGNOSIS — Z79899 Other long term (current) drug therapy: Secondary | ICD-10-CM | POA: Insufficient documentation

## 2016-10-14 DIAGNOSIS — J45909 Unspecified asthma, uncomplicated: Secondary | ICD-10-CM | POA: Insufficient documentation

## 2016-10-14 DIAGNOSIS — E119 Type 2 diabetes mellitus without complications: Secondary | ICD-10-CM | POA: Insufficient documentation

## 2016-10-14 DIAGNOSIS — Y939 Activity, unspecified: Secondary | ICD-10-CM | POA: Insufficient documentation

## 2016-10-14 DIAGNOSIS — I10 Essential (primary) hypertension: Secondary | ICD-10-CM | POA: Insufficient documentation

## 2016-10-14 DIAGNOSIS — M25561 Pain in right knee: Secondary | ICD-10-CM | POA: Diagnosis not present

## 2016-10-14 DIAGNOSIS — S82209A Unspecified fracture of shaft of unspecified tibia, initial encounter for closed fracture: Secondary | ICD-10-CM

## 2016-10-14 DIAGNOSIS — Y929 Unspecified place or not applicable: Secondary | ICD-10-CM | POA: Insufficient documentation

## 2016-10-14 DIAGNOSIS — W19XXXA Unspecified fall, initial encounter: Secondary | ICD-10-CM | POA: Insufficient documentation

## 2016-10-14 DIAGNOSIS — M79604 Pain in right leg: Secondary | ICD-10-CM | POA: Diagnosis not present

## 2016-10-14 HISTORY — DX: Unspecified fracture of shaft of unspecified tibia, initial encounter for closed fracture: S82.209A

## 2016-10-14 LAB — CBG MONITORING, ED: Glucose-Capillary: 113 mg/dL — ABNORMAL HIGH (ref 65–99)

## 2016-10-14 MED ORDER — OXYCODONE HCL 5 MG PO TABS: 5.0000 mg | ORAL_TABLET | Freq: Three times a day (TID) | ORAL | 0 refills | Status: DC | PRN

## 2016-10-14 MED ORDER — OXYCODONE HCL 5 MG PO TABS
5.0000 mg | ORAL_TABLET | Freq: Once | ORAL | Status: AC
  Administered 2016-10-14: 5 mg via ORAL
  Filled 2016-10-14: qty 1

## 2016-10-14 MED FILL — oxyCODONE HCL 5 MG TABS: 5 | 3 days supply | Qty: 11 | Fill #0

## 2016-10-14 NOTE — Discharge Instructions (Signed)
We did not find a fracture on x-ray today that requires surgical intervention at this moment. However we want you to follow up soon as possible with your orthopedic surgeon.

## 2016-10-14 NOTE — ED Triage Notes (Addendum)
Pt reports she accidentally put both feet in same pant leg while getting dressed this morning and fell on tile when trying to stand. States she heard a crack in right knee when she tried to stand up

## 2016-10-14 NOTE — ED Provider Notes (Signed)
[place] DEPT [facility] Provider Note   CSN: [id number] Arrival date & time: 10/14/16  2703     History   Chief Complaint Chief Complaint  Patient presents with  . Fall  . Knee Injury    HPI [name] is a 61 y.o. female.  HPI  Patient's 61 year old female with history of gastric sleeve, IBS, headaches presenting today with mechanical fall to the right knee. Patient put her pain to the same Leg and fell to the floor sustaining all her weight to her right knee.  Patient did not lose consciousness. Patient didn't feel syncopal after the fall due to the pain. Patient had trouble placing weight on the knee prior to arrival.  Past Medical History:  Diagnosis Date  . Allergic rhinitis   . Anxiety   . Asthma    hx bronchial asthma at times with upper resp infection  . Depression   . Diabetes mellitus without complication ([place])   . GERD (gastroesophageal reflux disease)   . Headache(784.0)    migraine and cluster headaches  . Hyperlipemia   . Hypertension   . IBS (irritable bowel syndrome)   . Menopause   . Neuromuscular disorder ([place]) 2009   hx of fibromyalgia, polyarthralsia (surgery induced)  . Stress incontinence    at times    Patient Active Problem List   Diagnosis Date Noted  . Keratosis 12/04/2014  . Onychocryptosis 08/21/2014  . Onychomycosis 05/21/2014  . Fissured skin 01/22/2014  . Fissure in skin of foot 12/18/2013  . Porokeratosis 12/18/2013  . Pain in lower limb 11/21/2013  . Ingrown nail 07/20/2013  . 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 Obesity (BMI 30-39.9) 06/25/2013  . GERD (gastroesophageal reflux disease) 08/20/2011  . Lapband APL + [facility] repair June 2011-Major revision Dec 2013 06/29/2011  . ABDOMINAL PAIN, ACUTE 08/01/2010  . VITAMIN B12 DEFICIENCY 04/01/2010  . BARIATRIC SURGERY STATUS 01/29/2010  . ONYCHOMYCOSIS, BILATERAL 06/30/2009  . STRESS INCONTINENCE 06/10/2009  . ACUTE PHARYNGITIS  04/29/2009  . COUGH 04/27/2009  . BRONCHITIS, ACUTE 04/16/2009  . NAUSEA 04/08/2009  . DIARRHEA 04/08/2009  . MORBID OBESITY 03/03/2009  . SKIN RASH 11/18/2008  . UNSPECIFIED VITAMIN D DEFICIENCY 07/15/2008  . OTHER SPECIFIED ANEMIAS 07/15/2008  . Pain in joint, multiple sites 06/13/2008  . Myalgia and myositis, unspecified 06/13/2008  . BACK PAIN, LUMBAR 02/14/2008  . ACUTE SINUSITIS, UNSPECIFIED 09/01/2007  . CELLULITIS 08/12/2007  . ANXIETY 06/09/2007  . DEPRESSION 06/09/2007  . DM (diabetes mellitus) type II uncontrolled, periph vascular disorder ([place]) 01/04/2007  . Hyperlipidemia LDL goal <100 01/04/2007  . Essential hypertension 01/04/2007  . ALLERGIC RHINITIS 01/04/2007  . HEADACHE 01/04/2007    Past Surgical History:  Procedure Laterality Date  . ABDOMINAL HYSTERECTOMY  12/1999  . CERVICAL CONIZATION W/BX  june 1990   dysplasia of cervix/used 5Fu cream for 3 months  . CERVICAL LAMINECTOMY  2005 & 2009   X 2   NO ROM PROBLEMS  . [place]  . DILATION AND CURETTAGE OF UTERUS   [phone]   missed abortion  . ESOPHAGOGASTRODUODENOSCOPY  06/29/2011   Procedure: ESOPHAGOGASTRODUODENOSCOPY (EGD);  Surgeon: Shann Medal, MD;  Location: [facility];  Service: General;  Laterality: N/A;  . FOOT SURGERY  2005   RT HEEL  . GASTRIC BANDING PORT REVISION  09/24/2011   Procedure: GASTRIC BANDING PORT REVISION;  Surgeon: Pedro Earls, MD;  Location: [facility];  Service: General;  Laterality: N/A;  . HYSTEROSCOPY  1999  . LAPAROSCOPIC GASTRIC BANDING  12/30/09  . LAPAROSCOPIC GASTRIC SLEEVE RESECTION N/A 07/02/2013   Procedure: LAPAROSCOPIC GASTRIC SLEEVE RESECTION upper endoscopy;  Surgeon: Pedro Earls, MD;  Location: [facility];  Service: General;  Laterality: N/A;  . LAPAROSCOPIC REPAIR AND REMOVAL OF GASTRIC BAND N/A 07/02/2013   Procedure: LAPAROSCOPIC REMOVAL OF GASTRIC BAND;  Surgeon: Pedro Earls, MD;  Location: [facility];  Service: General;  Laterality: N/A;  . LAPAROSCOPIC REVISION OF GASTRIC BAND  07/03/2012   Procedure: LAPAROSCOPIC REVISION OF GASTRIC BAND;  Surgeon: Pedro Earls, MD;  Location: [facility];  Service: General;  Laterality: N/A;  removal of old lap. band port, replaced with AP standard band  . LAPAROSCOPY  09/24/2011   Procedure: LAPAROSCOPY DIAGNOSTIC;  Surgeon: Pedro Earls, MD;  Location: [facility];  Service: General;  Laterality: N/A;  . LAPAROSCOPY  07/03/2012   Procedure: LAPAROSCOPY DIAGNOSTIC;  Surgeon: Pedro Earls, MD;  Location: [facility];  Service: General;  Laterality: N/A;  Exploratory Laparoscopy   . MESH APPLIED TO LAP PORT  07/03/2012   Procedure: MESH APPLIED TO LAP PORT;  Surgeon: Pedro Earls, MD;  Location: [facility];  Service: General;  Laterality: N/A;  . [place]   X 2  . right knee  1981   ARTHROSCOPY AND ARTHROTOMY  . right knee arthroscopy and arthrotomy  12-1979  . TONSILLECTOMY  1971  . TUBAL LIGATION  1993   WITH C -SECTION    OB History    Gravida Para Term Preterm AB Living   3 0 0 0 1 2   SAB TAB Ectopic Multiple Live Births   1 0 0 0         Home Medications    Prior to Admission medications   Medication Sig Start Date End Date Taking? Authorizing Provider  Ca Phosphate-Cholecalciferol (CALTRATE GUMMY BITES PO) Take 2 tablets by mouth at bedtime.   Yes Historical Provider, MD  citalopram (CELEXA) 20 MG tablet Take 1 tablet (20 mg total) by mouth every evening. 02/27/16  Yes [name] Chase, DO  Cyanocobalamin 500 MCG/0.1ML SOLN 1 spray in one  nostril weekly 03/01/16  Yes [name] Chase, DO  cyclobenzaprine (FLEXERIL) 10 MG tablet TAKE 1 TABLET (10 MG TOTAL) BY MOUTH 3 TIMES DAILY AS NEEDED FOR MUSCLE SPASMS. 08/17/16  Yes [name] Chase, DO  Efinaconazole (JUBLIA) 10 % SOLN Apply 1 applicator topically every morning. 08/21/14  Yes [name], DPM  estradiol (VIVELLE-DOT) 0.1 MG/24HR Place 1 patch  (0.1 mg total) onto the skin 2 (two) times a week. Monday and Thursday 06/27/12  Yes [name], MD  furosemide (LASIX) 20 MG tablet TAKE 1 TABLET (20 MG TOTAL) BY MOUTH EVERY MORNING. 09/10/16  Yes [name] Chase, DO  glucose blood [facility] VERIO) test strip Check blood sugar daily 02/27/16  Yes Alferd Apa Lowne Chase, DO  glucose blood test strip Test blood sugar once daily. Dx Code: E11.9 07/31/15  Yes [name] Chase, DO  HYDROcodone-acetaminophen (HYCET) 7.5-325 mg/15 ml solution Take 15 mLs by mouth 4 (four) times daily as needed for moderate pain. 09/03/16  Yes [name] Chase, DO  loratadine (CLARITIN) 10 MG tablet Take 10 mg by mouth every evening.    Yes Historical Provider, MD  olmesartan (BENICAR) 5 MG tablet Take 1  tablet (5 mg total) by mouth daily. 09/10/16  Yes [name] Chase, DO  [facility] DELICA LANCETS FINE MISC check blood sugar daily 07/16/14  Yes [name] Chase, DO  pantoprazole (PROTONIX) 40 MG tablet Take 1 tablet (40 mg total) by mouth daily. 06/14/16  Yes [name], DO  Pediatric Multivit-Minerals-C (ONE-A-DAY SCOOBY-DOO GUMMIES PO) Take 1 tablet by mouth daily.   Yes Historical Provider, MD  potassium chloride (K-DUR,KLOR-CON) 10 MEQ tablet TAKE 1 TABLET (10 MEQ TOTAL) BY MOUTH DAILY. 09/10/16  Yes [name] Chase, DO  PREMARIN vaginal cream Apply 1 application topically 2 (two) times a week. 01/05/16  Yes Historical Provider, MD  promethazine-codeine (PHENERGAN WITH CODEINE) 6.25-10 MG/5ML syrup Take 5 mLs by mouth every 8 (eight) hours as needed for cough. 09/10/16  Yes [name] Chase, DO  ranitidine (ZANTAC) 150 MG tablet Take 1 tablet (150 mg total) by mouth 2 (two) times daily. 02/27/16  Yes [name] Chase, DO  rosuvastatin (CRESTOR) 20 MG tablet TAKE 1 TABLET (20 MG TOTAL) BY MOUTH AT BEDTIME. 09/10/16  Yes Alferd Apa Lowne Chase, DO  azithromycin (ZITHROMAX Z-PAK) 250 MG tablet As directed 09/10/16   [name] Chase, DO    Blood Glucose Monitoring Suppl [facility] CONTOUR LINK MONITOR) W/DEVICE KIT Test blood sugar once daily DX code: E11.9 07/18/14   [name], DO    Family History Family History  Problem Relation Age of Onset  . Diabetes Mother   . Stroke Mother   . Breast cancer Mother   . Atrial fibrillation Mother   . Hypertension Mother   . Cancer Mother     breast  . Diabetes Father   . Stroke Father   . Hypertension Father   . Heart attack Father   . Thyroid disease    . Heart disease      Social History Social History  Substance Use Topics  . Smoking status: Never Smoker  . Smokeless tobacco: Never Used  . Alcohol use No     Allergies   Isoniazid; Nitrofurantoin; Hctz [hydrochlorothiazide]; Promethazine hcl [promethazine hcl]; Tamiflu [oseltamivir phosphate]; Macrolides and ketolides; Morphine; Percocet [oxycodone-acetaminophen]; Roxicet [oxycodone-acetaminophen]; Sulfamethoxazole-trimethoprim; and Fentanyl   Review of Systems Review of Systems  Constitutional: Negative for activity change.  Respiratory: Negative for shortness of breath.   Cardiovascular: Negative for chest pain.  Gastrointestinal: Negative for abdominal pain.  Musculoskeletal: Positive for gait problem.  All other systems reviewed and are negative.    Physical Exam Updated Vital Signs Ht '5\' 11"'  (1.803 m)   Wt 195 lb (88.5 kg)   BMI 27.20 kg/m   Physical Exam  Constitutional: She is oriented to person, place, and time. She appears well-developed and well-nourished.  HENT:  Head: Normocephalic and atraumatic.  Eyes: Right eye exhibits no discharge.  Cardiovascular: Normal rate, regular rhythm and normal heart sounds.   No murmur heard. Pulmonary/Chest: Effort normal and breath sounds normal. She has no wheezes. She has no rales.  Abdominal: Soft. She exhibits no distension. There is no tenderness.  Musculoskeletal:  Right knee with swelling and small hematoma distal to the right knee.  Overlying the tibia. Patient has full range of motion to the right hip right ankle right foot. No other signs of injury. Good pulses and sensation distally.  Neurological: She is oriented to person, place, and time.  Skin: Skin is warm and dry. She is not diaphoretic.  Psychiatric: She has a normal mood  and affect.  Nursing note and vitals reviewed.    ED Treatments / Results  Labs (all labs ordered are listed, but only abnormal results are displayed) Labs Reviewed - No data to display  EKG  EKG Interpretation None       Radiology No results found.  Procedures Procedures (including critical care time)  Medications Ordered in ED Medications  oxyCODONE (Oxy IR/ROXICODONE) immediate release tablet 5 mg (5 mg Oral Given 10/14/16 0849)     Initial Impression / Assessment and Plan / ED Course  I have reviewed the triage vital signs and the nursing notes.  Pertinent labs & imaging results that were available during my care of the patient were reviewed by me and considered in my medical decision making (see chart for details).     Patient is a 61 year old female presenting with mechanical injury to the right knee. No sig laxity on exam. Distal to true knee joint. Doubt tib plateau given mechanism.  We'll get x-ray.  Xray shows post surgerical changes.  Pt able to bear weight, walk with crutches.  Discussed with Dr. Lovenia Kim (on for Dr. Maxie Better), he will see her in the office tomorrow morning at 9 am.    Final Clinical Impressions(s) / ED Diagnoses   Final diagnoses:  Fall    New Prescriptions New Prescriptions   No medications on file     Markella Dao Julio Alm, MD 10/14/16 1112

## 2016-10-14 NOTE — ED Notes (Signed)
D/c home with family member. Rx given. Pt's husband verbalized that he made a f/u appt with ortho tomorrow am

## 2016-10-15 DIAGNOSIS — M25561 Pain in right knee: Secondary | ICD-10-CM | POA: Diagnosis not present

## 2016-10-15 DIAGNOSIS — M25571 Pain in right ankle and joints of right foot: Secondary | ICD-10-CM | POA: Diagnosis not present

## 2016-10-15 MED FILL — METHOCARBAMOL 500 MG TABLET: 500 | 12 days supply | Qty: 40 | Fill #0

## 2016-10-15 MED FILL — HYDROCOD-APAP 7.5-325/15ML: 7.5-325 | 4 days supply | Qty: 300 | Fill #0

## 2016-10-20 DIAGNOSIS — S82191A Other fracture of upper end of right tibia, initial encounter for closed fracture: Secondary | ICD-10-CM | POA: Diagnosis not present

## 2016-10-22 DIAGNOSIS — S82191D Other fracture of upper end of right tibia, subsequent encounter for closed fracture with routine healing: Secondary | ICD-10-CM | POA: Diagnosis not present

## 2016-10-25 ENCOUNTER — Encounter: Payer: Self-pay | Admitting: Internal Medicine

## 2016-10-29 DIAGNOSIS — S82191D Other fracture of upper end of right tibia, subsequent encounter for closed fracture with routine healing: Secondary | ICD-10-CM | POA: Diagnosis not present

## 2016-11-06 DIAGNOSIS — S82191D Other fracture of upper end of right tibia, subsequent encounter for closed fracture with routine healing: Secondary | ICD-10-CM | POA: Diagnosis not present

## 2016-11-11 MED FILL — METOCLOPRAMIDE 5 MG TABLET: 5 | 30 days supply | Qty: 30 | Fill #1

## 2016-11-15 ENCOUNTER — Ambulatory Visit: Payer: 59 | Admitting: Podiatry

## 2016-11-15 MED FILL — METHOCARBAMOL 500 MG TABLET: 500 | 12 days supply | Qty: 40 | Fill #1

## 2016-11-16 DIAGNOSIS — S82191D Other fracture of upper end of right tibia, subsequent encounter for closed fracture with routine healing: Secondary | ICD-10-CM | POA: Diagnosis not present

## 2016-11-17 ENCOUNTER — Ambulatory Visit: Payer: 59 | Admitting: Podiatry

## 2016-11-30 DIAGNOSIS — M25561 Pain in right knee: Secondary | ICD-10-CM | POA: Diagnosis not present

## 2016-11-30 DIAGNOSIS — S82191D Other fracture of upper end of right tibia, subsequent encounter for closed fracture with routine healing: Secondary | ICD-10-CM | POA: Diagnosis not present

## 2016-12-01 MED FILL — ROSUVASTATIN CALCIUM 20 MG: 20 | 90 days supply | Qty: 90 | Fill #0

## 2016-12-01 MED FILL — OLMESARTAN MEDOXOMIL 5 MG T: 5 | 90 days supply | Qty: 90 | Fill #0

## 2016-12-01 MED FILL — METHOCARBAMOL 500 MG TABLET: 500 | 13 days supply | Qty: 40 | Fill #0

## 2016-12-01 MED FILL — raNITIdine HCL 150 MG TABS: 150 | 90 days supply | Qty: 180 | Fill #2

## 2016-12-01 MED FILL — ESTRADIOL 0.1 MG PATCH: 0.1 | 84 days supply | Qty: 24 | Fill #0

## 2016-12-01 MED FILL — PANTOPRAZOLE SOD DR 40 MG T: 40 | 30 days supply | Qty: 30 | Fill #1

## 2016-12-01 MED FILL — FUROSEMIDE 20 MG TABLET: 20 | 90 days supply | Qty: 90 | Fill #0

## 2016-12-02 ENCOUNTER — Ambulatory Visit (INDEPENDENT_AMBULATORY_CARE_PROVIDER_SITE_OTHER): Payer: 59 | Admitting: Podiatry

## 2016-12-02 DIAGNOSIS — B351 Tinea unguium: Secondary | ICD-10-CM | POA: Diagnosis not present

## 2016-12-02 DIAGNOSIS — M79671 Pain in right foot: Secondary | ICD-10-CM

## 2016-12-02 DIAGNOSIS — M79672 Pain in left foot: Secondary | ICD-10-CM | POA: Diagnosis not present

## 2016-12-02 DIAGNOSIS — L600 Ingrowing nail: Secondary | ICD-10-CM

## 2016-12-02 NOTE — Progress Notes (Signed)
Subjective: 61year old female presents complaining of painful toe nails from ingrown on both great toes. Positive for recent history of Avulsion periosteal tibial fracture near knee cap on 10/14/16. Also had metatarsal bone and toe bones broken 3rd and 4th right.  Objective:  All nails are thick, hard, and hypertrophic.  Ingrown hallucal nails without open skin. Neurovascular status are within normal.   Assessment: Onychomycosis x 10. Symptomatic ingrown hallucal nail on both great toe. Hammer toe deformity 2 bilateral.  Pain in lower limbs.   Plan:  Reviewed findings. All nails debrided. Return as needed

## 2016-12-02 NOTE — Patient Instructions (Signed)
Seen for painful nails. All nails debrided. Return in 3 month for palliation.

## 2016-12-03 ENCOUNTER — Encounter: Payer: Self-pay | Admitting: Podiatry

## 2016-12-07 MED FILL — METOCLOPRAMIDE 5 MG TABLET: 5 | 30 days supply | Qty: 30 | Fill #0

## 2016-12-25 DIAGNOSIS — M25561 Pain in right knee: Secondary | ICD-10-CM | POA: Diagnosis not present

## 2016-12-27 MED FILL — METHOCARBAMOL 500 MG TABLET: 500 | 14 days supply | Qty: 40 | Fill #0

## 2016-12-30 ENCOUNTER — Other Ambulatory Visit: Payer: Self-pay | Admitting: Family Medicine

## 2016-12-30 DIAGNOSIS — K219 Gastro-esophageal reflux disease without esophagitis: Secondary | ICD-10-CM

## 2016-12-30 MED FILL — PANTOPRAZOLE SOD DR 40 MG T: 40 | 30 days supply | Qty: 30 | Fill #0

## 2017-01-18 MED FILL — METOCLOPRAMIDE 5 MG TABLET: 5 | 30 days supply | Qty: 30 | Fill #1

## 2017-01-18 MED FILL — METHOCARBAMOL 500 MG TABLET: 500 | 14 days supply | Qty: 40 | Fill #1

## 2017-01-18 MED FILL — CITALOPRAM HBR 20 MG TABLET: 20 | 90 days supply | Qty: 90 | Fill #1

## 2017-01-25 DIAGNOSIS — S82191D Other fracture of upper end of right tibia, subsequent encounter for closed fracture with routine healing: Secondary | ICD-10-CM | POA: Diagnosis not present

## 2017-01-25 DIAGNOSIS — M25561 Pain in right knee: Secondary | ICD-10-CM | POA: Diagnosis not present

## 2017-01-25 DIAGNOSIS — M25569 Pain in unspecified knee: Secondary | ICD-10-CM | POA: Diagnosis not present

## 2017-01-31 MED FILL — METHOCARBAMOL 500 MG TABLET: 500 | 13 days supply | Qty: 40 | Fill #0

## 2017-02-17 MED FILL — METOCLOPRAMIDE 5 MG TABLET: 5 | 30 days supply | Qty: 30 | Fill #2

## 2017-03-01 DIAGNOSIS — M25561 Pain in right knee: Secondary | ICD-10-CM | POA: Diagnosis not present

## 2017-03-01 DIAGNOSIS — S82191D Other fracture of upper end of right tibia, subsequent encounter for closed fracture with routine healing: Secondary | ICD-10-CM | POA: Diagnosis not present

## 2017-03-01 MED FILL — METHOCARBAMOL 500 MG TABLET: 500 | 20 days supply | Qty: 60 | Fill #0

## 2017-03-01 MED FILL — ROSUVASTATIN CALCIUM 20 MG: 20 | 90 days supply | Qty: 90 | Fill #1

## 2017-03-01 MED FILL — FUROSEMIDE 20 MG TAB: 20 | 90 days supply | Qty: 90 | Fill #1

## 2017-03-01 MED FILL — OLMESARTAN MEDOXOMIL 5 MG T: 5 | 90 days supply | Qty: 90 | Fill #1

## 2017-03-07 ENCOUNTER — Ambulatory Visit (INDEPENDENT_AMBULATORY_CARE_PROVIDER_SITE_OTHER): Payer: 59 | Admitting: Podiatry

## 2017-03-07 ENCOUNTER — Encounter: Payer: Self-pay | Admitting: Podiatry

## 2017-03-07 DIAGNOSIS — B351 Tinea unguium: Secondary | ICD-10-CM | POA: Diagnosis not present

## 2017-03-07 DIAGNOSIS — M79671 Pain in right foot: Secondary | ICD-10-CM

## 2017-03-07 DIAGNOSIS — L600 Ingrowing nail: Secondary | ICD-10-CM | POA: Diagnosis not present

## 2017-03-07 DIAGNOSIS — M79672 Pain in left foot: Secondary | ICD-10-CM | POA: Diagnosis not present

## 2017-03-07 NOTE — Progress Notes (Signed)
Subjective: 61 year old female presents complaining of painful toe nails fromingrown on both great toes. Positive for recent history of Avulsion periosteal tibial fracture near knee cap on 10/14/16. Was able to heal off the injury and returned back to work.  Objective:  All nails are thick, hard, and hypertrophic.  Ingrown hallucal nails without open skin. Neurovascular status are within normal.   Assessment: Onychomycosis x 10. Symptomatic ingrown hallucal nail on both great toe. Hammer toe deformity 2 bilateral.  Pain in lower limbs.   Plan:  Reviewed findings. All nails debrided. Return in 3 months.

## 2017-03-07 NOTE — Patient Instructions (Signed)
Seen for hypertrophic nails. All nails debrided. Return in 3 months or as needed.  

## 2017-03-09 DIAGNOSIS — H5203 Hypermetropia, bilateral: Secondary | ICD-10-CM | POA: Diagnosis not present

## 2017-03-09 DIAGNOSIS — H524 Presbyopia: Secondary | ICD-10-CM | POA: Diagnosis not present

## 2017-03-09 LAB — HM DIABETES EYE EXAM

## 2017-03-10 ENCOUNTER — Ambulatory Visit (INDEPENDENT_AMBULATORY_CARE_PROVIDER_SITE_OTHER): Payer: 59 | Admitting: Family Medicine

## 2017-03-10 ENCOUNTER — Encounter: Payer: Self-pay | Admitting: Family Medicine

## 2017-03-10 VITALS — BP 116/64 | HR 69 | Ht 71.0 in | Wt 209.8 lb

## 2017-03-10 DIAGNOSIS — K219 Gastro-esophageal reflux disease without esophagitis: Secondary | ICD-10-CM

## 2017-03-10 DIAGNOSIS — E785 Hyperlipidemia, unspecified: Secondary | ICD-10-CM | POA: Diagnosis not present

## 2017-03-10 DIAGNOSIS — F329 Major depressive disorder, single episode, unspecified: Secondary | ICD-10-CM

## 2017-03-10 DIAGNOSIS — I10 Essential (primary) hypertension: Secondary | ICD-10-CM | POA: Diagnosis not present

## 2017-03-10 DIAGNOSIS — E1165 Type 2 diabetes mellitus with hyperglycemia: Secondary | ICD-10-CM

## 2017-03-10 DIAGNOSIS — IMO0002 Reserved for concepts with insufficient information to code with codable children: Secondary | ICD-10-CM

## 2017-03-10 DIAGNOSIS — F32A Depression, unspecified: Secondary | ICD-10-CM

## 2017-03-10 DIAGNOSIS — E1151 Type 2 diabetes mellitus with diabetic peripheral angiopathy without gangrene: Secondary | ICD-10-CM

## 2017-03-10 LAB — COMPREHENSIVE METABOLIC PANEL
ALT: 17 U/L (ref 0–35)
AST: 17 U/L (ref 0–37)
Albumin: 4.7 g/dL (ref 3.5–5.2)
Alkaline Phosphatase: 64 U/L (ref 39–117)
BUN: 15 mg/dL (ref 6–23)
CO2: 33 mEq/L — ABNORMAL HIGH (ref 19–32)
Calcium: 10.3 mg/dL (ref 8.4–10.5)
Chloride: 99 mEq/L (ref 96–112)
Creatinine, Ser: 0.85 mg/dL (ref 0.40–1.20)
GFR: 72.47 mL/min (ref >60.00)
Glucose, Bld: 104 mg/dL — ABNORMAL HIGH (ref 70–99)
Potassium: 4.4 mEq/L (ref 3.5–5.1)
Sodium: 139 mEq/L (ref 135–145)
Total Bilirubin: 0.8 mg/dL (ref 0.2–1.2)
Total Protein: 8.2 g/dL (ref 6.0–8.3)

## 2017-03-10 LAB — LIPID PANEL
Cholesterol: 191 mg/dL (ref 0–200)
HDL: 62.9 mg/dL (ref >39.00)
LDL Cholesterol: 107 mg/dL — ABNORMAL HIGH (ref 0–99)
NonHDL: 128.34
Total CHOL/HDL Ratio: 3
Triglycerides: 106 mg/dL (ref 0.0–149.0)
VLDL: 21.2 mg/dL (ref 0.0–40.0)

## 2017-03-10 LAB — HEMOGLOBIN A1C: Hgb A1c MFr Bld: 6.3 % (ref 4.6–6.5)

## 2017-03-10 MED ORDER — POTASSIUM CHLORIDE CRYS ER 10 MEQ PO TBCR: EXTENDED_RELEASE_TABLET | ORAL | 1 refills | Status: DC

## 2017-03-10 MED ORDER — FUROSEMIDE 20 MG PO TABS: ORAL_TABLET | ORAL | 1 refills | Status: DC

## 2017-03-10 MED ORDER — PANTOPRAZOLE SODIUM 40 MG PO TBEC: 40.0000 mg | DELAYED_RELEASE_TABLET | Freq: Every day | ORAL | 3 refills | Status: DC

## 2017-03-10 MED ORDER — ROSUVASTATIN CALCIUM 20 MG PO TABS: ORAL_TABLET | ORAL | 1 refills | Status: DC

## 2017-03-10 MED ORDER — CITALOPRAM HYDROBROMIDE 20 MG PO TABS: 20.0000 mg | ORAL_TABLET | Freq: Every evening | ORAL | 3 refills | Status: DC

## 2017-03-10 MED ORDER — OLMESARTAN MEDOXOMIL 5 MG PO TABS: 5.0000 mg | ORAL_TABLET | Freq: Every day | ORAL | 3 refills | Status: DC

## 2017-03-10 MED ORDER — RANITIDINE HCL 150 MG PO TABS: 150.0000 mg | ORAL_TABLET | Freq: Two times a day (BID) | ORAL | 3 refills | Status: DC

## 2017-03-10 MED FILL — PANTOPRAZOLE SOD DR 40 MG T: 40 | 90 days supply | Qty: 90 | Fill #0

## 2017-03-10 MED FILL — raNITIdine HCL 150 MG TABS: 150 | 90 days supply | Qty: 180 | Fill #0

## 2017-03-10 MED FILL — POTASSIUM CL 10 MEQ TAB SA: 10 | 90 days supply | Qty: 90 | Fill #0

## 2017-03-10 NOTE — Assessment & Plan Note (Signed)
hgba1c acceptable, minimize simple carbs. Increase exercise as tolerated. Continue current meds 

## 2017-03-10 NOTE — Assessment & Plan Note (Signed)
Tolerating statin, encouraged heart healthy diet, avoid trans fats, minimize simple carbs and saturated fats. Increase exercise as tolerated 

## 2017-03-10 NOTE — Progress Notes (Signed)
Patient ID: [name], female    DOB: 03-02-1956  Age: 61 y.o. MRN: [id number]    Subjective:  Subjective  HPI [name] presents for f/u dm, cholesterol and bp.  No complaints.   Review of Systems  Constitutional: Negative for fever.  HENT: Negative for congestion.   Respiratory: Negative for shortness of breath.   Cardiovascular: Negative for chest pain, palpitations and leg swelling.  Gastrointestinal: Negative for abdominal pain, blood in stool and nausea.  Genitourinary: Negative for dysuria and frequency.  Skin: Negative for rash.  Allergic/Immunologic: Negative for environmental allergies.  Neurological: Negative for dizziness and headaches.  Psychiatric/Behavioral: The patient is not nervous/anxious.     History Past Medical History:  Diagnosis Date  . Allergic rhinitis   . Anxiety   . Asthma    hx bronchial asthma at times with upper resp infection  . Depression   . Diabetes mellitus without complication ([place])   . GERD (gastroesophageal reflux disease)   . Headache(784.0)    migraine and cluster headaches  . Hyperlipemia   . Hypertension   . IBS (irritable bowel syndrome)   . Menopause   . Neuromuscular disorder ([place]) 2009   hx of fibromyalgia, polyarthralsia (surgery induced)  . Stress incontinence    at times    She has a past surgical history that includes Cesarean section ([place]); Cervical laminectomy (2005 & 2009); Foot surgery (2005); right knee (1981); Nasal sinus surgery ([place]); Laparoscopic gastric banding (12/30/09); Esophagogastroduodenoscopy (06/29/2011); Hysteroscopy (1999); laparoscopy (09/24/2011); Gastric banding port revision (09/24/2011); Tonsillectomy (1971); Abdominal hysterectomy (12/1999); Cholecystectomy (1986); Tubal ligation (1993); right knee arthroscopy and arthrotomy (08-1192); Cervical conization w/bx (june 1990); laparoscopy (07/03/2012); Laparoscopic revision of gastric band (07/03/2012); Mesh applied to lap port  (07/03/2012); Dilation and curettage of uterus ( april-1989); Laparoscopic repair and removal of gastric band (N/A, 07/02/2013); and Laparoscopic gastric sleeve resection (N/A, 07/02/2013).   Her family history includes Atrial fibrillation in her mother; Breast cancer in her mother; Cancer in her mother; Diabetes in her father and mother; Heart attack in her father; Heart disease in her unknown relative; Hypertension in her father and mother; Stroke in her father and mother; Thyroid disease in her unknown relative.She reports that she has never smoked. She has never used smokeless tobacco. She reports that she does not drink alcohol or use drugs.  Current Outpatient Prescriptions on File Prior to Visit  Medication Sig Dispense Refill  . Ca Phosphate-Cholecalciferol (CALTRATE GUMMY BITES PO) Take 2 tablets by mouth at bedtime.    . cyclobenzaprine (FLEXERIL) 10 MG tablet TAKE 1 TABLET (10 MG TOTAL) BY MOUTH 3 TIMES DAILY AS NEEDED FOR MUSCLE SPASMS. 30 tablet 0  . estradiol (VIVELLE-DOT) 0.1 MG/24HR Place 1 patch (0.1 mg total) onto the skin 2 (two) times a week. Monday and Thursday 8 patch 11  . glucose blood (ONETOUCH VERIO) test strip Check blood sugar daily 100 each 12  . glucose blood test strip Test blood sugar once daily. Dx Code: E11.9 100 each 12  . HYDROcodone-acetaminophen (HYCET) 7.5-325 mg/15 ml solution Take 15 mLs by mouth 4 (four) times daily as needed for moderate pain. 120 mL 0  . loratadine (CLARITIN) 10 MG tablet Take 10 mg by mouth every evening.     [name] DELICA LANCETS FINE MISC check blood sugar daily 100 each 12  . Pediatric Multivit-Minerals-C (ONE-A-DAY SCOOBY-DOO GUMMIES PO) Take 1 tablet by mouth daily.    Marland Kitchen PREMARIN vaginal cream Apply 1  application topically 2 (two) times a week.  1  . promethazine-codeine (PHENERGAN WITH CODEINE) 6.25-10 MG/5ML syrup Take 5 mLs by mouth every 8 (eight) hours as needed for cough. 120 mL 0  . Blood Glucose Monitoring Suppl (BAYER  CONTOUR LINK MONITOR) W/DEVICE KIT Test blood sugar once daily DX code: E11.9 (Patient not taking: Reported on 03/10/2017) 1 kit 1  . oxyCODONE (OXY IR/ROXICODONE) 5 MG immediate release tablet Take 1 tablet (5 mg total) by mouth every 8 (eight) hours as needed for pain. (Patient not taking: Reported on 03/10/2017) 11 tablet 0   No current facility-administered medications on file prior to visit.      Objective:  Objective  Physical Exam  Constitutional: She is oriented to person, place, and time. She appears well-developed and well-nourished.  HENT:  Head: Normocephalic and atraumatic.  Eyes: Conjunctivae and EOM are normal.  Neck: Normal range of motion. Neck supple. No JVD present. Carotid bruit is not present. No thyromegaly present.  Cardiovascular: Normal rate, regular rhythm and normal heart sounds.   No murmur heard. Pulmonary/Chest: Effort normal and breath sounds normal. No respiratory distress. She has no wheezes. She has no rales. She exhibits no tenderness.  Musculoskeletal: She exhibits no edema.  Neurological: She is alert and oriented to person, place, and time.  Psychiatric: She has a normal mood and affect. Her behavior is normal. Judgment and thought content normal.  Nursing note and vitals reviewed.  BP 116/64 (BP Location: Left Arm, Patient Position: Sitting, Cuff Size: Normal)   Pulse 69   Ht _0  (1.803 m)   Wt 209 lb 12.8 oz (95.2 kg)   SpO2 98%   BMI 29.26 kg/m  Wt Readings from Last 3 Encounters:  03/10/17 209 lb 12.8 oz (95.2 kg)  10/14/16 195 lb (88.5 kg)  09/10/16 195 lb (88.5 kg)     Lab Results  Component Value Date   WBC 10.0 09/10/2016   HGB 14.0 09/10/2016   HCT 41.8 09/10/2016   PLT 260.0 09/10/2016   GLUCOSE 104 (H) 03/10/2017   CHOL 191 03/10/2017   TRIG 106.0 03/10/2017   HDL 62.90 03/10/2017   LDLCALC 107 (H) 03/10/2017   ALT 17 03/10/2017   AST 17 03/10/2017   NA 139 03/10/2017   K 4.4 03/10/2017   CL 99 03/10/2017    CREATININE 0.85 03/10/2017   BUN 15 03/10/2017   CO2 33 (H) 03/10/2017   TSH 1.87 12/24/2013   HGBA1C 6.3 03/10/2017   MICROALBUR 0.1 08/01/2014    Dg Tibia/fibula Right  Result Date: 10/14/2016 CLINICAL DATA:  Fell onto knee today. Pain and swelling. Initial encounter. Remote history of right knee surgery. EXAM: RIGHT TIBIA AND FIBULA - 2 VIEW COMPARISON:  None. FINDINGS: There is a well-defined defect in the anterior proximal tibia just distal to the tibial tuberosity which has an appearance more suggestive of a postsurgical osteotomy defect rather than an avulsion fracture. There is swelling involving the anterior soft tissues at this level. There is no other evidence of fracture. Medial and lateral compartment chondrocalcinosis is noted. The knee and ankle are located. Calcification is noted at the distal aspect of the Achilles tendon. There is a small plantar calcaneal enthesophyte. IMPRESSION: 1. No definite evidence of acute osseous abnormality. Defect in the proximal tibia is likely postsurgical rather than reflecting an acute fracture. 2. Soft tissue swelling anterior to the proximal tibia. Electronically Signed   By: Logan Bores M.D.   On: 10/14/2016 09:28  Dg Knee Complete 4 Views Right  Result Date: 10/14/2016 CLINICAL DATA:  [place] onto knee today. Pain and swelling. Initial encounter. Remote history of right knee surgery. EXAM: RIGHT KNEE - COMPLETE 4+ VIEW COMPARISON:  None. FINDINGS: There is no evidence of knee joint effusion or dislocation. There is a well-defined defect in the anterior proximal tibia just distal to the tibial tuberosity which has an appearance more suggestive of a postsurgical osteotomy defect rather than an avulsion fracture. There is swelling involving the anterior soft tissues at this level. There is no other evidence of fracture. Medial and lateral compartment chondrocalcinosis is noted without significant associated femorotibial joint space loss. There is moderate  patellofemoral osteophytosis with evidence of joint space narrowing. IMPRESSION: 1. No definite evidence of acute osseous abnormality. Defect in the proximal tibia is likely postsurgical rather than reflecting an acute fracture. 2. Soft tissue swelling anterior to the proximal tibia. 3. Medial and lateral compartment chondrocalcinosis. 4. Patellofemoral osteoarthrosis. Electronically Signed   By: Logan Bores M.D.   On: 10/14/2016 09:26     Assessment & Plan:  Plan  I have discontinued [name]'s Efinaconazole, Cyanocobalamin, and azithromycin. I am also having her maintain her loratadine, Ca Phosphate-Cholecalciferol (CALTRATE GUMMY BITES PO), Pediatric Multivit-Minerals-C (ONE-A-DAY SCOOBY-DOO GUMMIES PO), estradiol, ONETOUCH DELICA LANCETS FINE, BAYER CONTOUR LINK MONITOR, glucose blood, PREMARIN, glucose blood, cyclobenzaprine, HYDROcodone-acetaminophen, promethazine-codeine, oxyCODONE, sucralfate, metoCLOPramide, citalopram, furosemide, olmesartan, pantoprazole, potassium chloride, rosuvastatin, and ranitidine.  Meds ordered this encounter  Medications  . sucralfate (CARAFATE) 1 GM/10ML suspension    Sig: Carafate 100 mg/mL oral suspension  . metoCLOPramide (REGLAN) 10 MG tablet    Sig: Take 10 mg by mouth at bedtime.  . citalopram (CELEXA) 20 MG tablet    Sig: Take 1 tablet (20 mg total) by mouth every evening.    Dispense:  90 tablet    Refill:  3  . furosemide (LASIX) 20 MG tablet    Sig: TAKE 1 TABLET (20 MG TOTAL) BY MOUTH EVERY MORNING.    Dispense:  90 tablet    Refill:  1  . olmesartan (BENICAR) 5 MG tablet    Sig: Take 1 tablet (5 mg total) by mouth daily.    Dispense:  90 tablet    Refill:  3  . pantoprazole (PROTONIX) 40 MG tablet    Sig: Take 1 tablet (40 mg total) by mouth daily.    Dispense:  90 tablet    Refill:  3  . potassium chloride (K-DUR,KLOR-CON) 10 MEQ tablet    Sig: TAKE 1 TABLET (10 MEQ TOTAL) BY MOUTH DAILY.    Dispense:  90 tablet    Refill:  1  .  rosuvastatin (CRESTOR) 20 MG tablet    Sig: TAKE 1 TABLET (20 MG TOTAL) BY MOUTH AT BEDTIME.    Dispense:  90 tablet    Refill:  1  . ranitidine (ZANTAC) 150 MG tablet    Sig: Take 1 tablet (150 mg total) by mouth 2 (two) times daily.    Dispense:  180 tablet    Refill:  3    Problem List Items Addressed This Visit      Unprioritized   GERD (gastroesophageal reflux disease)   Relevant Medications   sucralfate (CARAFATE) 1 GM/10ML suspension   metoCLOPramide (REGLAN) 10 MG tablet   pantoprazole (PROTONIX) 40 MG tablet   ranitidine (ZANTAC) 150 MG tablet   Essential hypertension   Relevant Medications   furosemide (LASIX) 20 MG tablet   olmesartan (BENICAR)  5 MG tablet   potassium chloride (K-DUR,KLOR-CON) 10 MEQ tablet   rosuvastatin (CRESTOR) 20 MG tablet   Other Relevant Orders   Comprehensive metabolic panel (Completed)   DM (diabetes mellitus) type II uncontrolled, periph vascular disorder ([place]) - Primary    hgba1c acceptable, minimize simple carbs. Increase exercise as tolerated. Continue current meds      Relevant Medications   furosemide (LASIX) 20 MG tablet   olmesartan (BENICAR) 5 MG tablet   rosuvastatin (CRESTOR) 20 MG tablet   Other Relevant Orders   Comprehensive metabolic panel (Completed)   Hemoglobin A1c (Completed)   Hyperlipidemia LDL goal <100    Tolerating statin, encouraged heart healthy diet, avoid trans fats, minimize simple carbs and saturated fats. Increase exercise as tolerated      Relevant Medications   furosemide (LASIX) 20 MG tablet   olmesartan (BENICAR) 5 MG tablet   rosuvastatin (CRESTOR) 20 MG tablet    Other Visit Diagnoses    Hyperlipidemia LDL goal <70       Relevant Medications   furosemide (LASIX) 20 MG tablet   olmesartan (BENICAR) 5 MG tablet   rosuvastatin (CRESTOR) 20 MG tablet   Other Relevant Orders   Comprehensive metabolic panel (Completed)   Lipid panel (Completed)   Depression, unspecified depression type        Relevant Medications   citalopram (CELEXA) 20 MG tablet   Hyperlipidemia, unspecified hyperlipidemia type       Relevant Medications   furosemide (LASIX) 20 MG tablet   olmesartan (BENICAR) 5 MG tablet   rosuvastatin (CRESTOR) 20 MG tablet      Follow-up: Return in about 6 months (around 09/08/2017) for annual exam, fasting.  [name], DO

## 2017-03-10 NOTE — Patient Instructions (Signed)

## 2017-03-12 ENCOUNTER — Encounter: Payer: Self-pay | Admitting: Family Medicine

## 2017-03-15 ENCOUNTER — Other Ambulatory Visit: Payer: Self-pay | Admitting: Family Medicine

## 2017-03-15 DIAGNOSIS — E785 Hyperlipidemia, unspecified: Secondary | ICD-10-CM

## 2017-03-15 DIAGNOSIS — E119 Type 2 diabetes mellitus without complications: Secondary | ICD-10-CM

## 2017-03-17 NOTE — Telephone Encounter (Signed)
Other surgery?

## 2017-03-18 ENCOUNTER — Other Ambulatory Visit: Payer: Self-pay | Admitting: Family Medicine

## 2017-03-18 ENCOUNTER — Telehealth: Payer: Self-pay | Admitting: *Deleted

## 2017-03-18 DIAGNOSIS — Z23 Encounter for immunization: Secondary | ICD-10-CM

## 2017-03-18 DIAGNOSIS — K219 Gastro-esophageal reflux disease without esophagitis: Secondary | ICD-10-CM | POA: Diagnosis not present

## 2017-03-18 NOTE — Telephone Encounter (Signed)
Order in epic. 

## 2017-03-18 NOTE — Telephone Encounter (Signed)
Patient called and states when she last seen Dr.Lowne she wanted to get the Shingles vaccine and it was on back order, [facility] on [address]  has the vaccine. They just need a order out in [facility] for the patient to pick the vaccine up. Please advise

## 2017-03-21 ENCOUNTER — Telehealth: Payer: Self-pay | Admitting: Family Medicine

## 2017-03-21 MED ORDER — ZOSTER VAC RECOMB ADJUVANTED 50 MCG/0.5ML IM SUSR: 0.5000 mL | Freq: Once | INTRAMUSCULAR | 0 refills | Status: AC

## 2017-03-21 MED FILL — SHINGRIX 50 MCG SUS: 50 | 1 days supply | Qty: 1 | Fill #0

## 2017-03-21 MED FILL — CARAFATE 1 GM/10 ML SUSP: 1 | 84 days supply | Qty: 420 | Fill #0

## 2017-03-21 NOTE — Telephone Encounter (Signed)
[place]- [phone]    Called in with questions about a Rx that was sent in for  pt

## 2017-03-21 NOTE — Telephone Encounter (Signed)
Pt is aware that vaccine was sent to her pharmacy. She had no additional questions at this time. Nothing further is needed

## 2017-03-21 NOTE — Telephone Encounter (Signed)
Spoke with [name] at the pharmacy and she was just asking for 1 refill on the shingles vaccine since it is a two step. I gave the verbal that this was ok. She had no additional questions at this time. Nothing further is needed

## 2017-03-22 ENCOUNTER — Other Ambulatory Visit: Payer: Self-pay | Admitting: Surgery

## 2017-03-22 DIAGNOSIS — K219 Gastro-esophageal reflux disease without esophagitis: Secondary | ICD-10-CM

## 2017-03-22 MED FILL — METOCLOPRAMIDE 5 MG TABLET: 5 | 30 days supply | Qty: 30 | Fill #0

## 2017-03-24 ENCOUNTER — Encounter: Payer: Self-pay | Admitting: Family Medicine

## 2017-03-24 ENCOUNTER — Ambulatory Visit
Admission: RE | Admit: 2017-03-24 | Discharge: 2017-03-24 | Disposition: A | Discharge status: Home / Self Care | Payer: 59 | Source: Ambulatory Visit | Attending: Surgery | Admitting: Surgery

## 2017-03-24 DIAGNOSIS — K219 Gastro-esophageal reflux disease without esophagitis: Secondary | ICD-10-CM | POA: Diagnosis not present

## 2017-03-28 ENCOUNTER — Other Ambulatory Visit: Payer: 59

## 2017-04-03 ENCOUNTER — Encounter: Payer: Self-pay | Admitting: Family Medicine

## 2017-04-14 DIAGNOSIS — Z903 Acquired absence of stomach [part of]: Secondary | ICD-10-CM | POA: Diagnosis not present

## 2017-04-14 DIAGNOSIS — F509 Eating disorder, unspecified: Secondary | ICD-10-CM | POA: Diagnosis not present

## 2017-04-19 ENCOUNTER — Encounter: Payer: Self-pay | Admitting: Registered"

## 2017-04-19 ENCOUNTER — Encounter: Payer: 59 | Attending: Surgery | Admitting: Registered"

## 2017-04-19 DIAGNOSIS — Z713 Dietary counseling and surveillance: Secondary | ICD-10-CM | POA: Diagnosis not present

## 2017-04-19 DIAGNOSIS — Z903 Acquired absence of stomach [part of]: Secondary | ICD-10-CM | POA: Insufficient documentation

## 2017-04-19 DIAGNOSIS — E119 Type 2 diabetes mellitus without complications: Secondary | ICD-10-CM

## 2017-04-19 NOTE — Progress Notes (Signed)
Pre-Op Assessment Visit:  Pre-Operative Aleeve revision to RYGB Surgery  Medical Nutrition Therapy:  Appt start time: 8:00  End time:  8:40  Patient was seen on 04/19/2017 for Pre-Operative Nutrition Assessment. Assessment and letter of approval faxed to [facility] Bariatric Surgery Program coordinator on 04/19/2017.   Pt expectation of surgery: improve reflux   Pt expectation of Dietitian: explain things and support for help  Start weight at [facility]: 211.5 BMI: 30.35   Pt states she has had reflux for 1.5 years. Pt states this is a medically approved procedure. Pt states she checks BS 3x/day: FBS (86-94) and after meals (103-105).   Per insurance, pt needs 0 SWL visits prior to surgery.    24 hr Dietary Recall: First Meal: protein shake, coffee w/ unjury Snack: none Second Meal: salad with grilled chicken Snack: protein shake or greek yogurt Third Meal: baked chicken, cauliflower Snack: none Beverages: water, diet lemonade, coffee  Encouraged to engage in 150 minutes of moderate physical activity including cardiovascular and weight baring weekly  Handouts given during visit include:  . Pre-Op Goals . Bariatric Surgery Protein Shakes . Vitamin and Mineral Recommendations  During the appointment today the following Pre-Op Goals were reviewed with the patient: . Maintain or lose weight as instructed by your surgeon . Make healthy food choices . Begin to limit portion sizes . Limited concentrated sugars and fried foods . Keep fat/sugar in the single digits per serving on          food labels . Practice CHEWING your food  (aim for 30 chews per bite or until applesauce consistency) . Practice not drinking 15 minutes before, during, and 30 minutes after each meal/snack . Avoid all carbonated beverages  . Avoid/limit caffeinated beverages  . Avoid all sugar-sweetened beverages . Consume 3 meals per day; eat every 3-5 hours . Make a list of non-food related  activities . Aim for 64-100 ounces of FLUID daily  . Aim for at least 60-80 grams of PROTEIN daily . Look for a liquid protein source that contain ?15 g protein and ?5 g carbohydrate  (ex: shakes, drinks, shots) . Physical activity is an important part of a healthy lifestyle so keep it moving!  Follow diet recommendations listed below Energy and Macronutrient Recommendations: Calories: 1500 Carbohydrate: 170 Protein: 112 Fat: 42  Demonstrated degree of understanding via:  Teach Back   Teaching Method Utilized:  Visual Auditory Hands on  Barriers to learning/adherence to lifestyle change: none  Patient to call the [facility] to enroll in Pre-Op and Post-Op Nutrition Education when surgery date is scheduled.

## 2017-04-20 DIAGNOSIS — F509 Eating disorder, unspecified: Secondary | ICD-10-CM | POA: Diagnosis not present

## 2017-04-25 ENCOUNTER — Encounter: Payer: 59 | Admitting: Registered"

## 2017-04-25 DIAGNOSIS — Z903 Acquired absence of stomach [part of]: Secondary | ICD-10-CM | POA: Diagnosis not present

## 2017-04-25 DIAGNOSIS — E669 Obesity, unspecified: Secondary | ICD-10-CM

## 2017-04-25 DIAGNOSIS — Z713 Dietary counseling and surveillance: Secondary | ICD-10-CM | POA: Diagnosis not present

## 2017-04-25 NOTE — Progress Notes (Signed)
  Pre-Operative Nutrition Class:  Appt start time: 8:15   End time:  9:15  Patient was seen on 04/25/2017 for Pre-Operative Bariatric Surgery Education at the [facility].   Surgery date: TBD Surgery type: Sleeve conversion to RYGB [facility] weight at [facility]: 211.5 Weight today: 211.2   Samples given per MNT protocol. Patient educated on appropriate usage: Bariatric Advantage Multivitamin Lot # [id number] Exp: 01/2018  Bariatric Advantage Calcium Citrate (peanut butter chocolate) Lot # [id number] Exp: 05/13/2017  Bariatric Advantage Calcium Citrate (lemon) Lot # [id number] Exp: 03/24/2018  Renee Pain Protein Powder (chocolate) Lot # [id number] Exp: 09/2017   Unjury Protein Powder (strawberry) Lot # [id number] Exp: 09/2017  The following the learning objectives were met by the patient during this course:  Identify Pre-Op Dietary Goals and will begin 2 weeks pre-operatively  Identify appropriate sources of fluids and proteins   State protein recommendations and appropriate sources pre and post-operatively  Identify Post-Operative Dietary Goals and will follow for 2 weeks post-operatively  Identify appropriate multivitamin and calcium sources  Describe the need for physical activity post-operatively and will follow MD recommendations  State when to call healthcare provider regarding medication questions or post-operative complications  Handouts given during class include:  Pre-Op Bariatric Surgery Diet Handout  Protein Shake Handout  Post-Op Bariatric Surgery Nutrition Handout  BELT Program Information Flyer  Support Group Information Flyer  [facility] Bariatric Supplements Price List  Follow-Up Plan: Patient will follow-up at [facility] 2 weeks post operatively for diet advancement per MD.

## 2017-04-29 MED FILL — METOCLOPRAMIDE 5 MG TABLET: 5 | 30 days supply | Qty: 30 | Fill #1

## 2017-05-03 ENCOUNTER — Ambulatory Visit: Payer: 59 | Admitting: Skilled Nursing Facility1

## 2017-05-16 ENCOUNTER — Other Ambulatory Visit: Payer: Self-pay | Admitting: Family Medicine

## 2017-05-16 DIAGNOSIS — F329 Major depressive disorder, single episode, unspecified: Secondary | ICD-10-CM

## 2017-05-16 DIAGNOSIS — F32A Depression, unspecified: Secondary | ICD-10-CM

## 2017-05-16 MED FILL — CITALOPRAM HBR 20 MG TABLET: 20 | 90 days supply | Qty: 90 | Fill #0

## 2017-05-20 ENCOUNTER — Encounter: Payer: Self-pay | Admitting: Family Medicine

## 2017-05-23 MED FILL — SHINGRIX 50 MCG SUS: 50 | 1 days supply | Qty: 1 | Fill #1

## 2017-05-26 ENCOUNTER — Encounter: Payer: Self-pay | Admitting: Family Medicine

## 2017-05-26 ENCOUNTER — Other Ambulatory Visit: Payer: Self-pay | Admitting: *Deleted

## 2017-05-27 MED FILL — ROSUVASTATIN CALCIUM 20 MG: 20 | 90 days supply | Qty: 90 | Fill #0

## 2017-05-27 MED FILL — OLMESARTAN MEDOXOMIL 5 MG T: 5 | 90 days supply | Qty: 90 | Fill #2

## 2017-05-27 MED FILL — FUROSEMIDE 20 MG TAB: 20 | 90 days supply | Qty: 90 | Fill #0

## 2017-05-28 NOTE — Patient Outreach (Signed)
Pre-op phone call to this [facility] participant completed. 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 . She states she has completed her medical leave of absence papers. She is not sure if she has the hospital indemnity insurance so encouraged her to check her current benefits. She states her daughter and husband will assist with her recovery and they will drive her to her follow up appointments if necessary. She says she is in the process of completing her advanced directive.  No pre-op issues identified. Advised [name] this RNCM will call her post op to complete  the transition of care call. [employer] platform discussed as there is no data showing in the platform for many weeks. [name] says she monitors her blood sugar 2-3 times daily but the Accuchek glucometer does not connect to her phone so she is using another meter and buying her strips on line. She also says the other [place] devices would not stay connected to her phone even though she has an [employer] data processor. She does not take any medication for her diabetes and her most recent Hgb A1C= 6.3% and she says her blood sugar has been in good control for a long time. Since she is hopeful that her Type II diabetes will go into remission and after further discussion, she agreed to withdraw from the [facility] and understands she may re-enroll if she wishes. Barrington Ellison RN,CCM,CDE [place] Management Coordinator [facility] [phone] Office Fax [phone]

## 2017-05-30 ENCOUNTER — Other Ambulatory Visit: Payer: Self-pay

## 2017-05-30 ENCOUNTER — Encounter (HOSPITAL_COMMUNITY)
Admission: RE | Admit: 2017-05-30 | Discharge: 2017-05-30 | Disposition: A | Discharge status: Home / Self Care | Payer: 59 | Source: Ambulatory Visit | Attending: Surgery | Admitting: Surgery

## 2017-05-30 ENCOUNTER — Encounter (HOSPITAL_COMMUNITY): Payer: Self-pay

## 2017-05-30 DIAGNOSIS — Z01812 Encounter for preprocedural laboratory examination: Secondary | ICD-10-CM | POA: Diagnosis not present

## 2017-05-30 DIAGNOSIS — K219 Gastro-esophageal reflux disease without esophagitis: Secondary | ICD-10-CM | POA: Diagnosis not present

## 2017-05-30 DIAGNOSIS — Z01810 Encounter for preprocedural cardiovascular examination: Secondary | ICD-10-CM | POA: Insufficient documentation

## 2017-05-30 HISTORY — DX: Unspecified fracture of shaft of unspecified tibia, initial encounter for closed fracture: S82.209A

## 2017-05-30 MED FILL — METOCLOPRAMIDE 5 MG TABLET: 5 | 30 days supply | Qty: 30 | Fill #2

## 2017-05-30 NOTE — Patient Instructions (Signed)
[name]  05/30/2017   Your procedure is scheduled on: 06-06-17  Report to [facility] Main  Entrance Follow signs to Short Stay on first floor at 530  AM  Call this number if you have problems the morning of surgery  [phone]   Remember: ONLY 1 PERSON MAY GO WITH YOU TO SHORT STAY TO GET  READY MORNING OF [place].  Do not eat food or drink liquids :After Midnight.     Take these medicines the morning of surgery with A SIP OF WATER: none                               You may not have any metal on your body including hair pins and              piercings  Do not wear jewelry, make-up, lotions, powders or perfumes, deodorant             Do not wear nail polish.  Do not shave  48 hours prior to surgery.              Men may shave face and neck.   Do not bring valuables to the hospital. [place].  Contacts, dentures or bridgework may not be worn into surgery.  Leave suitcase in the car. After surgery it may be brought to your room.                  Please read over the following fact sheets you were given: _____________________________________________________________________             [facility] - Preparing for Surgery Before surgery, you can play an important role.  Because skin is not sterile, your skin needs to be as free of germs as possible.  You can reduce the number of germs on your skin by washing with CHG (chlorahexidine gluconate) soap before surgery.  CHG is an antiseptic cleaner which kills germs and bonds with the skin to continue killing germs even after washing. Please DO NOT use if you have an allergy to CHG or antibacterial soaps.  If your skin becomes reddened/irritated stop using the CHG and inform your nurse when you arrive at Short Stay. Do not shave (including legs and underarms) for at least 48 hours prior to the first CHG shower.  You may shave your  face/neck. Please follow these instructions carefully:  1.  Shower with CHG Soap the night before surgery and the  morning of Surgery.  2.  If you choose to wash your hair, wash your hair first as usual with your  normal  shampoo.  3.  After you shampoo, rinse your hair and body thoroughly to remove the  shampoo.                           4.  Use CHG as you would any other liquid soap.  You can apply chg directly  to the skin and wash                       Gently with a scrungie or clean washcloth.  5.  Apply the CHG Soap to your  body ONLY FROM THE NECK DOWN.   Do not use on face/ open                           Wound or open sores. Avoid contact with eyes, ears mouth and genitals (private parts).                       Wash face,  Genitals (private parts) with your normal soap.             6.  Wash thoroughly, paying special attention to the area where your surgery  will be performed.  7.  Thoroughly rinse your body with warm water from the neck down.  8.  DO NOT shower/wash with your normal soap after using and rinsing off  the CHG Soap.                9.  Pat yourself dry with a clean towel.            10.  Wear clean pajamas.            11.  Place clean sheets on your bed the night of your first shower and do not  sleep with pets. Day of Surgery : Do not apply any lotions/deodorants the morning of surgery.  Please wear clean clothes to the hospital/surgery center.  FAILURE TO FOLLOW THESE INSTRUCTIONS MAY RESULT IN THE CANCELLATION OF YOUR SURGERY PATIENT SIGNATURE_________________________________  NURSE SIGNATURE__________________________________  ________________________________________________________________________   [name]  An incentive spirometer is a tool that can help keep your lungs clear and active. This tool measures how well you are filling your lungs with each breath. Taking long deep breaths may help reverse or decrease the chance of developing breathing  (pulmonary) problems (especially infection) following:  A long period of time when you are unable to move or be active. BEFORE THE PROCEDURE   If the spirometer includes an indicator to show your best effort, your nurse or respiratory therapist will set it to a desired goal.  If possible, sit up straight or lean slightly forward. Try not to slouch.  Hold the incentive spirometer in an upright position. INSTRUCTIONS FOR USE  1. Sit on the edge of your bed if possible, or sit up as far as you can in bed or on a chair. 2. Hold the incentive spirometer in an upright position. 3. Breathe out normally. 4. Place the mouthpiece in your mouth and seal your lips tightly around it. 5. Breathe in slowly and as deeply as possible, raising the piston or the ball toward the top of the column. 6. Hold your breath for 3-5 seconds or for as long as possible. Allow the piston or ball to fall to the bottom of the column. 7. Remove the mouthpiece from your mouth and breathe out normally. 8. Rest for a few seconds and repeat Steps 1 through 7 at least 10 times every 1-2 hours when you are awake. Take your time and take a few normal breaths between deep breaths. 9. The spirometer may include an indicator to show your best effort. Use the indicator as a goal to work toward during each repetition. 10. After each set of 10 deep breaths, practice coughing to be sure your lungs are clear. If you have an incision (the cut made at the time of surgery), support your incision when coughing by placing a pillow or rolled up towels firmly against it.  Once you are able to get out of bed, walk around indoors and cough well. You may stop using the incentive spirometer when instructed by your caregiver.  RISKS AND COMPLICATIONS  Take your time so you do not get dizzy or light-headed.  If you are in pain, you may need to take or ask for pain medication before doing incentive spirometry. It is harder to take a deep breath if you  are having pain. AFTER USE  Rest and breathe slowly and easily.  It can be helpful to keep track of a log of your progress. Your caregiver can provide you with a simple table to help with this. If you are using the spirometer at home, follow these instructions: [place] IF:   You are having difficultly using the spirometer.  You have trouble using the spirometer as often as instructed.  Your pain medication is not giving enough relief while using the spirometer.  You develop fever of 100.5 F (38.1 C) or higher. SEEK IMMEDIATE MEDICAL CARE IF:   You cough up bloody sputum that had not been present before.  You develop fever of 102 F (38.9 C) or greater.  You develop worsening pain at or near the incision site. MAKE SURE YOU:   Understand these instructions.  Will watch your condition.  Will get help right away if you are not doing well or get worse. Document Released: 11/08/2006 Document Revised: 09/20/2011 Document Reviewed: 01/09/2007 [facility] Patient Information 2014 [place], [place].   ________________________________________________________________________

## 2017-05-31 ENCOUNTER — Other Ambulatory Visit (HOSPITAL_COMMUNITY): Payer: Self-pay | Admitting: *Deleted

## 2017-05-31 LAB — CBC
HCT: 37.7 % (ref 36.0–46.0)
Hemoglobin: 12.3 g/dL (ref 12.0–15.0)
MCH: 28.5 pg (ref 26.0–34.0)
MCHC: 32.6 g/dL (ref 30.0–36.0)
MCV: 87.5 fL (ref 78.0–100.0)
Platelets: 219 10*3/uL (ref 150–400)
RBC: 4.31 MIL/uL (ref 3.87–5.11)
RDW: 12.9 % (ref 11.5–15.5)
WBC: 8.3 10*3/uL (ref 4.0–10.5)

## 2017-05-31 LAB — DIFFERENTIAL
Basophils Absolute: 0 10*3/uL (ref 0.0–0.1)
Basophils Relative: 1 %
Eosinophils Absolute: 0.2 10*3/uL (ref 0.0–0.7)
Eosinophils Relative: 3 %
Lymphocytes Relative: 32 %
Lymphs Abs: 2.7 10*3/uL (ref 0.7–4.0)
Monocytes Absolute: 0.6 10*3/uL (ref 0.1–1.0)
Monocytes Relative: 7 %
Neutro Abs: 4.8 10*3/uL (ref 1.7–7.7)
Neutrophils Relative %: 57 %

## 2017-05-31 LAB — COMPREHENSIVE METABOLIC PANEL
ALT: 21 U/L (ref 14–54)
AST: 19 U/L (ref 15–41)
Albumin: 3.9 g/dL (ref 3.5–5.0)
Alkaline Phosphatase: 58 U/L (ref 38–126)
Anion gap: 9 (ref 5–15)
BUN: 21 mg/dL — ABNORMAL HIGH (ref 6–20)
CO2: 28 mmol/L (ref 22–32)
Calcium: 9.1 mg/dL (ref 8.9–10.3)
Chloride: 99 mmol/L — ABNORMAL LOW (ref 101–111)
Creatinine, Ser: 0.84 mg/dL (ref 0.44–1.00)
GFR calc Af Amer: >60 mL/min (ref >60)
GFR calc non Af Amer: >60 mL/min (ref >60)
Glucose, Bld: 142 mg/dL — ABNORMAL HIGH (ref 65–99)
Potassium: 3.8 mmol/L (ref 3.5–5.1)
Sodium: 136 mmol/L (ref 135–145)
Total Bilirubin: 0.5 mg/dL (ref 0.3–1.2)
Total Protein: 7.5 g/dL (ref 6.5–8.1)

## 2017-05-31 LAB — HEMOGLOBIN A1C
Hgb A1c MFr Bld: 6.1 % — ABNORMAL HIGH (ref 4.8–5.6)
Mean Plasma Glucose: 128.37 mg/dL

## 2017-06-01 ENCOUNTER — Ambulatory Visit: Payer: Self-pay | Admitting: Surgery

## 2017-06-01 ENCOUNTER — Encounter: Payer: Self-pay | Admitting: Family Medicine

## 2017-06-01 LAB — GLUCOSE, CAPILLARY: Glucose-Capillary: 89 mg/dL (ref 65–99)

## 2017-06-01 MED FILL — ONDANSETRON ODT 4 MG TABLET: 4 | 5 days supply | Qty: 20 | Fill #0

## 2017-06-01 MED FILL — oxyCODONE HCL 5 MG/5ML SOLN: 5 | 5 days supply | Qty: 200 | Fill #0

## 2017-06-01 NOTE — H&P (Signed)
. 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  History of Present Illness:  [name] is an 61 y.o. 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  This has been unmanageable with high dose PPIs.  We have discussed options and conversion to roux en Y gastric bypass is the likely best alternative.   Past Medical History:  Diagnosis Date  . Allergic rhinitis   . Anxiety   . Asthma    hx bronchial asthma at times with upper resp infection  . Depression   . Diabetes mellitus without complication (HCC)    diet controlled no meds  . GERD (gastroesophageal reflux disease)   . Headache(784.0)    migraine and cluster headaches  . Hyperlipemia   . Hypertension   . IBS (irritable bowel syndrome)   . Menopause   . Neuromuscular disorder ([place]) 2009   hx of fibromyalgia, polyarthralsia (surgery induced)  . Stress incontinence    at times  . Tibial fracture 10/14/2016   evulsion periostial right    Past Surgical History:  Procedure Laterality Date  . ABDOMINAL HYSTERECTOMY  12/1999   complete  . CERVICAL CONIZATION W/BX  june 1990   dysplasia of cervix/used 5Fu cream for 3 months  . CERVICAL LAMINECTOMY  2005 & 2009   X 2   NO ROM PROBLEMS  . [place]  . DILATION AND CURETTAGE OF UTERUS   [id number]   missed abortion  . ESOPHAGOGASTRODUODENOSCOPY  06/29/2011   Procedure: ESOPHAGOGASTRODUODENOSCOPY (EGD);  Surgeon: Shann Medal, MD;  Location: [facility];  Service: General;  Laterality: N/A;  . FOOT SURGERY  2005   RT HEEL  . GASTRIC BANDING PORT REVISION  09/24/2011   Procedure: GASTRIC BANDING PORT REVISION;  Surgeon: Pedro Earls, MD;  Location: [facility];  Service: General;  Laterality: N/A;  . HYSTEROSCOPY  1999  . LAPAROSCOPIC GASTRIC BANDING  12/30/09  . LAPAROSCOPIC GASTRIC SLEEVE RESECTION N/A 07/02/2013   Procedure: LAPAROSCOPIC GASTRIC SLEEVE RESECTION upper endoscopy;   Surgeon: Pedro Earls, MD;  Location: [facility];  Service: General;  Laterality: N/A;  . LAPAROSCOPIC REPAIR AND REMOVAL OF GASTRIC BAND N/A 07/02/2013   Procedure: LAPAROSCOPIC REMOVAL OF GASTRIC BAND;  Surgeon: Pedro Earls, MD;  Location: [facility];  Service: General;  Laterality: N/A;  . LAPAROSCOPIC REVISION OF GASTRIC BAND  07/03/2012   Procedure: LAPAROSCOPIC REVISION OF GASTRIC BAND;  Surgeon: Pedro Earls, MD;  Location: [facility];  Service: General;  Laterality: N/A;  removal of old lap. band port, replaced with AP standard band  . LAPAROSCOPY  09/24/2011   Procedure: LAPAROSCOPY DIAGNOSTIC;  Surgeon: Pedro Earls, MD;  Location: [facility];  Service: General;  Laterality: N/A;  . LAPAROSCOPY  07/03/2012   Procedure: LAPAROSCOPY DIAGNOSTIC;  Surgeon: Pedro Earls, MD;  Location: [facility];  Service: General;  Laterality: N/A;  Exploratory Laparoscopy   . MESH APPLIED TO LAP PORT  07/03/2012   Procedure: MESH APPLIED TO LAP PORT;  Surgeon: Pedro Earls, MD;  Location: [facility];  Service: General;  Laterality: N/A;  . [place]   X 2  . right knee  1981   ARTHROSCOPY AND ARTHROTOMY  . right knee arthroscopy and arthrotomy  12-1979  . TONSILLECTOMY  1971  . TUBAL LIGATION  1993   WITH C -SECTION    Current Outpatient Medications  Medication Sig Dispense Refill  . Blood Glucose Monitoring Suppl (BAYER CONTOUR LINK MONITOR) W/DEVICE KIT Test blood sugar once daily DX code: E11.9 1 kit 1  . Ca Phosphate-Cholecalciferol (CALTRATE GUMMY BITES PO) Take 2 each 2 (two) times daily by mouth.     . citalopram (CELEXA) 20 MG tablet TAKE 1 TABLET (20 MG TOTAL) BY MOUTH EVERY EVENING. 90 tablet 3  . cyclobenzaprine (FLEXERIL) 10 MG tablet TAKE 1 TABLET (10 MG TOTAL) BY MOUTH 3 TIMES DAILY AS NEEDED FOR MUSCLE SPASMS. 30 tablet 0  . estradiol (VIVELLE-DOT) 0.1 MG/24HR Place 1 patch (0.1 mg total) onto the skin 2 (two) times a week. Monday and Thursday 8 patch 11  .  furosemide (LASIX) 20 MG tablet TAKE 1 TABLET (20 MG TOTAL) BY MOUTH EVERY MORNING. 90 tablet 1  . glucose blood (ONETOUCH VERIO) test strip Check blood sugar daily 100 each 12  . glucose blood test strip Test blood sugar once daily. Dx Code: E11.9 100 each 12  . loratadine (CLARITIN) 10 MG tablet Take 10 mg by mouth every evening.     . metoCLOPramide (REGLAN) 10 MG tablet Take 10 mg by mouth at bedtime.    Marland Kitchen olmesartan (BENICAR) 5 MG tablet Take 1 tablet (5 mg total) by mouth daily. 90 tablet 3  . ONETOUCH DELICA LANCETS FINE MISC check blood sugar daily 100 each 12  . pantoprazole (PROTONIX) 40 MG tablet Take 1 tablet (40 mg total) by mouth daily. (Patient taking differently: Take 40 mg at bedtime by mouth. ) 90 tablet 3  . Pediatric Multivit-Minerals-C (ONE-A-DAY SCOOBY-DOO GUMMIES PO) Take 1 tablet 2 (two) times daily by mouth.     . potassium chloride (K-DUR,KLOR-CON) 10 MEQ tablet TAKE 1 TABLET (10 MEQ TOTAL) BY MOUTH DAILY. 90 tablet 1  . PREMARIN vaginal cream Apply 1 application topically 2 (two) times a week.  1  . ranitidine (ZANTAC) 150 MG tablet Take 1 tablet (150 mg total) by mouth 2 (two) times daily. 180 tablet 3  . rosuvastatin (CRESTOR) 20 MG tablet TAKE 1 TABLET (20 MG TOTAL) BY MOUTH AT BEDTIME. 90 tablet 1  . sucralfate (CARAFATE) 1 GM/10ML suspension Take 1 g at bedtime by mouth.    . thiamine (VITAMIN B-1) 100 MG tablet Take 100 mg daily by mouth.     No current facility-administered medications for this visit.    Isoniazid; Nitrofurantoin; Hctz [hydrochlorothiazide]; Promethazine hcl [promethazine hcl]; Tamiflu [oseltamivir phosphate]; Macrolides and ketolides; Morphine; Percocet [oxycodone-acetaminophen]; Roxicet [oxycodone-acetaminophen]; Sulfamethoxazole-trimethoprim; and Fentanyl Family History  Problem Relation Age of Onset  . Diabetes Mother   . Stroke Mother   . Breast cancer Mother   . Atrial fibrillation Mother   . Hypertension Mother   . Cancer Mother         breast  . Diabetes Father   . Stroke Father   . Hypertension Father   . Heart attack Father   . Thyroid disease Unknown   . Heart disease Unknown   . COPD Other    Social History:   reports that  has never smoked. she has never used smokeless tobacco. She reports that she does not drink alcohol or use drugs.   REVIEW OF SYSTEMS : Negative except for see problem lsit  Physical Exam:   There were no vitals taken for this visit. There is no height or weight on file to calculate BMI.  Gen:  WDWN WF NAD  Neurological: Alert and oriented to person, place, and time. Motor  and sensory function is grossly intact  Head: Normocephalic and atraumatic.  Eyes: Conjunctivae are normal. Pupils are equal, round, and reactive to light. No scleral icterus.  Neck: Normal range of motion. Neck supple. No tracheal deviation or thyromegaly present.  Cardiovascular:  SR without murmurs or gallops.  No carotid bruits Breast:  Not examined Respiratory: Effort normal.  No respiratory distress. No chest wall tenderness. Breath sounds normal.  No wheezes, rales or rhonchi.  Abdomen:  Multiple scars well healed from prior laparoscopic surgery including lapband and sleeve gastrectomy GU:  Not examined Musculoskeletal: Normal range of motion. Extremities are nontender. No cyanosis, edema or clubbing noted Lymphadenopathy: No cervical, preauricular, postauricular or axillary adenopathy is present Skin: Skin is warm and dry. No rash noted. No diaphoresis. No erythema. No pallor. Pscyh: Normal mood and affect. Behavior is normal. Judgment and thought content normal.   LABORATORY RESULTS: Results for orders placed or performed during the hospital encounter of 05/30/17 (from the past 48 hour(s))  Glucose, capillary     Status: None   Collection Time: 05/30/17  6:24 PM  Result Value Ref Range   Glucose-Capillary 89 65 - 99 mg/dL  CBC     Status: None   Collection Time: 05/31/17  5:40 PM  Result Value Ref Range    WBC 8.3 4.0 - 10.5 K/uL   RBC 4.31 3.87 - 5.11 MIL/uL   Hemoglobin 12.3 12.0 - 15.0 g/dL   HCT 37.7 36.0 - 46.0 %   MCV 87.5 78.0 - 100.0 fL   MCH 28.5 26.0 - 34.0 pg   MCHC 32.6 30.0 - 36.0 g/dL   RDW 12.9 11.5 - 15.5 %   Platelets 219 150 - 400 K/uL  Differential     Status: None   Collection Time: 05/31/17  5:40 PM  Result Value Ref Range   Neutrophils Relative % 57 %   Neutro Abs 4.8 1.7 - 7.7 K/uL   Lymphocytes Relative 32 %   Lymphs Abs 2.7 0.7 - 4.0 K/uL   Monocytes Relative 7 %   Monocytes Absolute 0.6 0.1 - 1.0 K/uL   Eosinophils Relative 3 %   Eosinophils Absolute 0.2 0.0 - 0.7 K/uL   Basophils Relative 1 %   Basophils Absolute 0.0 0.0 - 0.1 K/uL  Comprehensive metabolic panel     Status: Abnormal   Collection Time: 05/31/17  5:40 PM  Result Value Ref Range   Sodium 136 135 - 145 mmol/L   Potassium 3.8 3.5 - 5.1 mmol/L   Chloride 99 (L) 101 - 111 mmol/L   CO2 28 22 - 32 mmol/L   Glucose, Bld 142 (H) 65 - 99 mg/dL   BUN 21 (H) 6 - 20 mg/dL   Creatinine, Ser 0.84 0.44 - 1.00 mg/dL   Calcium 9.1 8.9 - 10.3 mg/dL   Total Protein 7.5 6.5 - 8.1 g/dL   Albumin 3.9 3.5 - 5.0 g/dL   AST 19 15 - 41 U/L   ALT 21 14 - 54 U/L   Alkaline Phosphatase 58 38 - 126 U/L   Total Bilirubin 0.5 0.3 - 1.2 mg/dL   GFR calc non Af Amer >60 >60 mL/min   GFR calc Af Amer >60 >60 mL/min    Comment: (NOTE) The eGFR has been calculated using the CKD EPI equation. This calculation has not been validated in all clinical situations. eGFR's persistently <60 mL/min signify possible Chronic Kidney Disease.    Anion gap 9 5 - 15  Hemoglobin A1c  Status: Abnormal   Collection Time: 05/31/17  5:40 PM  Result Value Ref Range   Hgb A1c MFr Bld 6.1 (H) 4.8 - 5.6 %    Comment: (NOTE) Pre diabetes:          5.7%-6.4% Diabetes:              >6.4% Glycemic control for   <7.0% adults with diabetes    Mean Plasma Glucose 128.37 mg/dL    Comment: Performed at [facility] [address] [place], [place] [address]     RADIOLOGY RESULTS: No results found.  Problem List: Patient Active Problem List   Diagnosis Date Noted  . Keratosis 12/04/2014  . Onychocryptosis 08/21/2014  . Onychomycosis 05/21/2014  . Fissured skin 01/22/2014  . Fissure in skin of foot 12/18/2013  . Porokeratosis 12/18/2013  . Pain in lower limb 11/21/2013  . Ingrown nail 07/20/2013  . 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 Obesity (BMI 30-39.9) 06/25/2013  . GERD (gastroesophageal reflux disease) 08/20/2011  . Lapband APL + [facility] repair June 2011-Major revision Dec 2013 06/29/2011  . ABDOMINAL PAIN, ACUTE 08/01/2010  . VITAMIN B12 DEFICIENCY 04/01/2010  . BARIATRIC SURGERY STATUS 01/29/2010  . ONYCHOMYCOSIS, BILATERAL 06/30/2009  . STRESS INCONTINENCE 06/10/2009  . ACUTE PHARYNGITIS 04/29/2009  . COUGH 04/27/2009  . BRONCHITIS, ACUTE 04/16/2009  . NAUSEA 04/08/2009  . DIARRHEA 04/08/2009  . MORBID OBESITY 03/03/2009  . SKIN RASH 11/18/2008  . UNSPECIFIED VITAMIN D DEFICIENCY 07/15/2008  . OTHER SPECIFIED ANEMIAS 07/15/2008  . Pain in joint, multiple sites 06/13/2008  . Myalgia and myositis, unspecified 06/13/2008  . BACK PAIN, LUMBAR 02/14/2008  . ACUTE SINUSITIS, UNSPECIFIED 09/01/2007  . CELLULITIS 08/12/2007  . ANXIETY 06/09/2007  . DEPRESSION 06/09/2007  . DM (diabetes mellitus) type II uncontrolled, periph vascular disorder ([place]) 01/04/2007  . Hyperlipidemia LDL goal <100 01/04/2007  . Essential hypertension 01/04/2007  . ALLERGIC RHINITIS 01/04/2007  . HEADACHE 01/04/2007    Assessment & Plan: Conversion of sleeve to gastric bypass.      [name] Done, MD, [facility] [phone] beeper [phone]  06/01/2017 10:37 AM

## 2017-06-01 NOTE — H&P (View-Only) (Signed)
. 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  History of Present Illness:  [name] is an 61 y.o. 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  This has been unmanageable with high dose PPIs.  We have discussed options and conversion to roux en Y gastric bypass is the likely best alternative.   Past Medical History:  Diagnosis Date  . Allergic rhinitis   . Anxiety   . Asthma    hx bronchial asthma at times with upper resp infection  . Depression   . Diabetes mellitus without complication (HCC)    diet controlled no meds  . GERD (gastroesophageal reflux disease)   . Headache(784.0)    migraine and cluster headaches  . Hyperlipemia   . Hypertension   . IBS (irritable bowel syndrome)   . Menopause   . Neuromuscular disorder ([place]) 2009   hx of fibromyalgia, polyarthralsia (surgery induced)  . Stress incontinence    at times  . Tibial fracture 10/14/2016   evulsion periostial right    Past Surgical History:  Procedure Laterality Date  . ABDOMINAL HYSTERECTOMY  12/1999   complete  . CERVICAL CONIZATION W/BX  june 1990   dysplasia of cervix/used 5Fu cream for 3 months  . CERVICAL LAMINECTOMY  2005 & 2009   X 2   NO ROM PROBLEMS  . [place]  . DILATION AND CURETTAGE OF UTERUS   [phone]   missed abortion  . ESOPHAGOGASTRODUODENOSCOPY  06/29/2011   Procedure: ESOPHAGOGASTRODUODENOSCOPY (EGD);  Surgeon: Shann Medal, MD;  Location: [facility];  Service: General;  Laterality: N/A;  . FOOT SURGERY  2005   RT HEEL  . GASTRIC BANDING PORT REVISION  09/24/2011   Procedure: GASTRIC BANDING PORT REVISION;  Surgeon: Pedro Earls, MD;  Location: [facility];  Service: General;  Laterality: N/A;  . HYSTEROSCOPY  1999  . LAPAROSCOPIC GASTRIC BANDING  12/30/09  . LAPAROSCOPIC GASTRIC SLEEVE RESECTION N/A 07/02/2013   Procedure: LAPAROSCOPIC GASTRIC SLEEVE RESECTION upper endoscopy;   Surgeon: Pedro Earls, MD;  Location: [facility];  Service: General;  Laterality: N/A;  . LAPAROSCOPIC REPAIR AND REMOVAL OF GASTRIC BAND N/A 07/02/2013   Procedure: LAPAROSCOPIC REMOVAL OF GASTRIC BAND;  Surgeon: Pedro Earls, MD;  Location: [facility];  Service: General;  Laterality: N/A;  . LAPAROSCOPIC REVISION OF GASTRIC BAND  07/03/2012   Procedure: LAPAROSCOPIC REVISION OF GASTRIC BAND;  Surgeon: Pedro Earls, MD;  Location: [facility];  Service: General;  Laterality: N/A;  removal of old lap. band port, replaced with AP standard band  . LAPAROSCOPY  09/24/2011   Procedure: LAPAROSCOPY DIAGNOSTIC;  Surgeon: Pedro Earls, MD;  Location: [facility];  Service: General;  Laterality: N/A;  . LAPAROSCOPY  07/03/2012   Procedure: LAPAROSCOPY DIAGNOSTIC;  Surgeon: Pedro Earls, MD;  Location: [facility];  Service: General;  Laterality: N/A;  Exploratory Laparoscopy   . MESH APPLIED TO LAP PORT  07/03/2012   Procedure: MESH APPLIED TO LAP PORT;  Surgeon: Pedro Earls, MD;  Location: [facility];  Service: General;  Laterality: N/A;  . [place]   X 2  . right knee  1981   ARTHROSCOPY AND ARTHROTOMY  . right knee arthroscopy and arthrotomy  12-1979  . TONSILLECTOMY  1971  . TUBAL LIGATION  1993   WITH C -SECTION    Current Outpatient Medications  Medication Sig Dispense Refill  . Blood Glucose Monitoring Suppl (BAYER CONTOUR LINK MONITOR) W/DEVICE KIT Test blood sugar once daily DX code: E11.9 1 kit 1  . Ca Phosphate-Cholecalciferol (CALTRATE GUMMY BITES PO) Take 2 each 2 (two) times daily by mouth.     . citalopram (CELEXA) 20 MG tablet TAKE 1 TABLET (20 MG TOTAL) BY MOUTH EVERY EVENING. 90 tablet 3  . cyclobenzaprine (FLEXERIL) 10 MG tablet TAKE 1 TABLET (10 MG TOTAL) BY MOUTH 3 TIMES DAILY AS NEEDED FOR MUSCLE SPASMS. 30 tablet 0  . estradiol (VIVELLE-DOT) 0.1 MG/24HR Place 1 patch (0.1 mg total) onto the skin 2 (two) times a week. Monday and Thursday 8 patch 11  .  furosemide (LASIX) 20 MG tablet TAKE 1 TABLET (20 MG TOTAL) BY MOUTH EVERY MORNING. 90 tablet 1  . glucose blood (ONETOUCH VERIO) test strip Check blood sugar daily 100 each 12  . glucose blood test strip Test blood sugar once daily. Dx Code: E11.9 100 each 12  . loratadine (CLARITIN) 10 MG tablet Take 10 mg by mouth every evening.     . metoCLOPramide (REGLAN) 10 MG tablet Take 10 mg by mouth at bedtime.    Marland Kitchen olmesartan (BENICAR) 5 MG tablet Take 1 tablet (5 mg total) by mouth daily. 90 tablet 3  . ONETOUCH DELICA LANCETS FINE MISC check blood sugar daily 100 each 12  . pantoprazole (PROTONIX) 40 MG tablet Take 1 tablet (40 mg total) by mouth daily. (Patient taking differently: Take 40 mg at bedtime by mouth. ) 90 tablet 3  . Pediatric Multivit-Minerals-C (ONE-A-DAY SCOOBY-DOO GUMMIES PO) Take 1 tablet 2 (two) times daily by mouth.     . potassium chloride (K-DUR,KLOR-CON) 10 MEQ tablet TAKE 1 TABLET (10 MEQ TOTAL) BY MOUTH DAILY. 90 tablet 1  . PREMARIN vaginal cream Apply 1 application topically 2 (two) times a week.  1  . ranitidine (ZANTAC) 150 MG tablet Take 1 tablet (150 mg total) by mouth 2 (two) times daily. 180 tablet 3  . rosuvastatin (CRESTOR) 20 MG tablet TAKE 1 TABLET (20 MG TOTAL) BY MOUTH AT BEDTIME. 90 tablet 1  . sucralfate (CARAFATE) 1 GM/10ML suspension Take 1 g at bedtime by mouth.    . thiamine (VITAMIN B-1) 100 MG tablet Take 100 mg daily by mouth.     No current facility-administered medications for this visit.    Isoniazid; Nitrofurantoin; Hctz [hydrochlorothiazide]; Promethazine hcl [promethazine hcl]; Tamiflu [oseltamivir phosphate]; Macrolides and ketolides; Morphine; Percocet [oxycodone-acetaminophen]; Roxicet [oxycodone-acetaminophen]; Sulfamethoxazole-trimethoprim; and Fentanyl Family History  Problem Relation Age of Onset  . Diabetes Mother   . Stroke Mother   . Breast cancer Mother   . Atrial fibrillation Mother   . Hypertension Mother   . Cancer Mother         breast  . Diabetes Father   . Stroke Father   . Hypertension Father   . Heart attack Father   . Thyroid disease Unknown   . Heart disease Unknown   . COPD Other    Social History:   reports that  has never smoked. she has never used smokeless tobacco. She reports that she does not drink alcohol or use drugs.   REVIEW OF SYSTEMS : Negative except for see problem lsit  Physical Exam:   There were no vitals taken for this visit. There is no height or weight on file to calculate BMI.  Gen:  WDWN WF NAD  Neurological: Alert and oriented to person, place, and time. Motor  and sensory function is grossly intact  Head: Normocephalic and atraumatic.  Eyes: Conjunctivae are normal. Pupils are equal, round, and reactive to light. No scleral icterus.  Neck: Normal range of motion. Neck supple. No tracheal deviation or thyromegaly present.  Cardiovascular:  SR without murmurs or gallops.  No carotid bruits Breast:  Not examined Respiratory: Effort normal.  No respiratory distress. No chest wall tenderness. Breath sounds normal.  No wheezes, rales or rhonchi.  Abdomen:  Multiple scars well healed from prior laparoscopic surgery including lapband and sleeve gastrectomy GU:  Not examined Musculoskeletal: Normal range of motion. Extremities are nontender. No cyanosis, edema or clubbing noted Lymphadenopathy: No cervical, preauricular, postauricular or axillary adenopathy is present Skin: Skin is warm and dry. No rash noted. No diaphoresis. No erythema. No pallor. Pscyh: Normal mood and affect. Behavior is normal. Judgment and thought content normal.   LABORATORY RESULTS: Results for orders placed or performed during the hospital encounter of 05/30/17 (from the past 48 hour(s))  Glucose, capillary     Status: None   Collection Time: 05/30/17  6:24 PM  Result Value Ref Range   Glucose-Capillary 89 65 - 99 mg/dL  CBC     Status: None   Collection Time: 05/31/17  5:40 PM  Result Value Ref Range    WBC 8.3 4.0 - 10.5 K/uL   RBC 4.31 3.87 - 5.11 MIL/uL   Hemoglobin 12.3 12.0 - 15.0 g/dL   HCT 37.7 36.0 - 46.0 %   MCV 87.5 78.0 - 100.0 fL   MCH 28.5 26.0 - 34.0 pg   MCHC 32.6 30.0 - 36.0 g/dL   RDW 12.9 11.5 - 15.5 %   Platelets 219 150 - 400 K/uL  Differential     Status: None   Collection Time: 05/31/17  5:40 PM  Result Value Ref Range   Neutrophils Relative % 57 %   Neutro Abs 4.8 1.7 - 7.7 K/uL   Lymphocytes Relative 32 %   Lymphs Abs 2.7 0.7 - 4.0 K/uL   Monocytes Relative 7 %   Monocytes Absolute 0.6 0.1 - 1.0 K/uL   Eosinophils Relative 3 %   Eosinophils Absolute 0.2 0.0 - 0.7 K/uL   Basophils Relative 1 %   Basophils Absolute 0.0 0.0 - 0.1 K/uL  Comprehensive metabolic panel     Status: Abnormal   Collection Time: 05/31/17  5:40 PM  Result Value Ref Range   Sodium 136 135 - 145 mmol/L   Potassium 3.8 3.5 - 5.1 mmol/L   Chloride 99 (L) 101 - 111 mmol/L   CO2 28 22 - 32 mmol/L   Glucose, Bld 142 (H) 65 - 99 mg/dL   BUN 21 (H) 6 - 20 mg/dL   Creatinine, Ser 0.84 0.44 - 1.00 mg/dL   Calcium 9.1 8.9 - 10.3 mg/dL   Total Protein 7.5 6.5 - 8.1 g/dL   Albumin 3.9 3.5 - 5.0 g/dL   AST 19 15 - 41 U/L   ALT 21 14 - 54 U/L   Alkaline Phosphatase 58 38 - 126 U/L   Total Bilirubin 0.5 0.3 - 1.2 mg/dL   GFR calc non Af Amer >60 >60 mL/min   GFR calc Af Amer >60 >60 mL/min    Comment: (NOTE) The eGFR has been calculated using the CKD EPI equation. This calculation has not been validated in all clinical situations. eGFR's persistently <60 mL/min signify possible Chronic Kidney Disease.    Anion gap 9 5 - 15  Hemoglobin A1c  Status: Abnormal   Collection Time: 05/31/17  5:40 PM  Result Value Ref Range   Hgb A1c MFr Bld 6.1 (H) 4.8 - 5.6 %    Comment: (NOTE) Pre diabetes:          5.7%-6.4% Diabetes:              >6.4% Glycemic control for   <7.0% adults with diabetes    Mean Plasma Glucose 128.37 mg/dL    Comment: Performed at [facility] [address], [place], [place] [address]     RADIOLOGY RESULTS: No results found.  Problem List: Patient Active Problem List   Diagnosis Date Noted  . Keratosis 12/04/2014  . Onychocryptosis 08/21/2014  . Onychomycosis 05/21/2014  . Fissured skin 01/22/2014  . Fissure in skin of foot 12/18/2013  . Porokeratosis 12/18/2013  . Pain in lower limb 11/21/2013  . Ingrown nail 07/20/2013  . 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 Obesity (BMI 30-39.9) 06/25/2013  . GERD (gastroesophageal reflux disease) 08/20/2011  . Lapband APL + [facility] repair June 2011-Major revision Dec 2013 06/29/2011  . ABDOMINAL PAIN, ACUTE 08/01/2010  . VITAMIN B12 DEFICIENCY 04/01/2010  . BARIATRIC SURGERY STATUS 01/29/2010  . ONYCHOMYCOSIS, BILATERAL 06/30/2009  . STRESS INCONTINENCE 06/10/2009  . ACUTE PHARYNGITIS 04/29/2009  . COUGH 04/27/2009  . BRONCHITIS, ACUTE 04/16/2009  . NAUSEA 04/08/2009  . DIARRHEA 04/08/2009  . MORBID OBESITY 03/03/2009  . SKIN RASH 11/18/2008  . UNSPECIFIED VITAMIN D DEFICIENCY 07/15/2008  . OTHER SPECIFIED ANEMIAS 07/15/2008  . Pain in joint, multiple sites 06/13/2008  . Myalgia and myositis, unspecified 06/13/2008  . BACK PAIN, LUMBAR 02/14/2008  . ACUTE SINUSITIS, UNSPECIFIED 09/01/2007  . CELLULITIS 08/12/2007  . ANXIETY 06/09/2007  . DEPRESSION 06/09/2007  . DM (diabetes mellitus) type II uncontrolled, periph vascular disorder ([place]) 01/04/2007  . Hyperlipidemia LDL goal <100 01/04/2007  . Essential hypertension 01/04/2007  . ALLERGIC RHINITIS 01/04/2007  . HEADACHE 01/04/2007    Assessment & Plan: Conversion of sleeve to gastric bypass.      [name] Done, MD, [facility] [phone] beeper [phone]  06/01/2017 10:37 AM

## 2017-06-05 NOTE — Anesthesia Preprocedure Evaluation (Addendum)
Anesthesia Evaluation  Patient identified by MRN, date of birth, ID band Patient awake    Reviewed: Allergy & Precautions, H&P , NPO status , Patient's Chart, lab work & pertinent test results  Airway Mallampati: II   Neck ROM: full    Dental   Pulmonary asthma ,    breath sounds clear to auscultation       Cardiovascular hypertension,  Rhythm:regular Rate:Normal     Neuro/Psych Anxiety Depression  Neuromuscular disease    GI/Hepatic GERD  ,Previous gasrtic band surgery   Endo/Other  diabetes, Well Controlled, Type 2  Renal/GU      Musculoskeletal  (+) Fibromyalgia -  Abdominal   Peds  Hematology   Anesthesia Other Findings   Reproductive/Obstetrics                            Anesthesia Physical Anesthesia Plan  ASA: III  Anesthesia Plan: General   Post-op Pain Management:    Induction: Intravenous  PONV Risk Score and Plan: 4 or greater and Ondansetron, Dexamethasone, Midazolam and Scopolamine patch - Pre-op  Airway Management Planned: Oral ETT  Additional Equipment:   Intra-op Plan:   Post-operative Plan: Extubation in OR  Informed Consent: I have reviewed the patients History and Physical, chart, labs and discussed the procedure including the risks, benefits and alternatives for the proposed anesthesia with the patient or authorized representative who has indicated his/her understanding and acceptance.   Dental advisory given  Plan Discussed with: CRNA  Anesthesia Plan Comments:         Anesthesia Quick Evaluation

## 2017-06-06 ENCOUNTER — Inpatient Hospital Stay (HOSPITAL_COMMUNITY)
Admission: RE | Admit: 2017-06-06 | Discharge: 2017-06-07 | DRG: 328 | Disposition: A | Discharge status: Home / Self Care | Payer: 59 | Source: Ambulatory Visit | Attending: Surgery | Admitting: Surgery

## 2017-06-06 ENCOUNTER — Encounter (HOSPITAL_COMMUNITY): Payer: Self-pay | Admitting: *Deleted

## 2017-06-06 ENCOUNTER — Inpatient Hospital Stay (HOSPITAL_COMMUNITY): Payer: 59 | Admitting: Anesthesiology

## 2017-06-06 ENCOUNTER — Encounter (HOSPITAL_COMMUNITY)
Admission: RE | Disposition: A | Discharge status: Home / Self Care | Payer: Self-pay | Source: Ambulatory Visit | Attending: Surgery

## 2017-06-06 ENCOUNTER — Other Ambulatory Visit: Payer: Self-pay

## 2017-06-06 DIAGNOSIS — K432 Incisional hernia without obstruction or gangrene: Secondary | ICD-10-CM | POA: Diagnosis present

## 2017-06-06 DIAGNOSIS — M797 Fibromyalgia: Secondary | ICD-10-CM | POA: Diagnosis present

## 2017-06-06 DIAGNOSIS — K458 Other specified abdominal hernia without obstruction or gangrene: Secondary | ICD-10-CM | POA: Diagnosis present

## 2017-06-06 DIAGNOSIS — I10 Essential (primary) hypertension: Secondary | ICD-10-CM | POA: Diagnosis present

## 2017-06-06 DIAGNOSIS — E119 Type 2 diabetes mellitus without complications: Secondary | ICD-10-CM | POA: Diagnosis not present

## 2017-06-06 DIAGNOSIS — Z6829 Body mass index (BMI) 29.0-29.9, adult: Secondary | ICD-10-CM | POA: Diagnosis not present

## 2017-06-06 DIAGNOSIS — Z79899 Other long term (current) drug therapy: Secondary | ICD-10-CM

## 2017-06-06 DIAGNOSIS — Z9884 Bariatric surgery status: Secondary | ICD-10-CM

## 2017-06-06 DIAGNOSIS — F419 Anxiety disorder, unspecified: Secondary | ICD-10-CM | POA: Diagnosis present

## 2017-06-06 DIAGNOSIS — K219 Gastro-esophageal reflux disease without esophagitis: Secondary | ICD-10-CM | POA: Diagnosis present

## 2017-06-06 DIAGNOSIS — E785 Hyperlipidemia, unspecified: Secondary | ICD-10-CM | POA: Diagnosis present

## 2017-06-06 DIAGNOSIS — K660 Peritoneal adhesions (postprocedural) (postinfection): Secondary | ICD-10-CM | POA: Diagnosis present

## 2017-06-06 DIAGNOSIS — K589 Irritable bowel syndrome without diarrhea: Secondary | ICD-10-CM | POA: Diagnosis present

## 2017-06-06 DIAGNOSIS — K9509 Other complications of gastric band procedure: Secondary | ICD-10-CM | POA: Diagnosis present

## 2017-06-06 DIAGNOSIS — J45909 Unspecified asthma, uncomplicated: Secondary | ICD-10-CM | POA: Diagnosis present

## 2017-06-06 DIAGNOSIS — E6609 Other obesity due to excess calories: Secondary | ICD-10-CM | POA: Diagnosis not present

## 2017-06-06 HISTORY — PX: GASTRIC ROUX-EN-Y: SHX5262

## 2017-06-06 LAB — CBC
HCT: 38.3 % (ref 36.0–46.0)
Hemoglobin: 12.3 g/dL (ref 12.0–15.0)
MCH: 28.1 pg (ref 26.0–34.0)
MCHC: 32.1 g/dL (ref 30.0–36.0)
MCV: 87.4 fL (ref 78.0–100.0)
Platelets: 210 10*3/uL (ref 150–400)
RBC: 4.38 MIL/uL (ref 3.87–5.11)
RDW: 13.1 % (ref 11.5–15.5)
WBC: 16.2 10*3/uL — ABNORMAL HIGH (ref 4.0–10.5)

## 2017-06-06 LAB — CREATININE, SERUM
Creatinine, Ser: 0.89 mg/dL (ref 0.44–1.00)
GFR calc Af Amer: >60 mL/min (ref >60)
GFR calc non Af Amer: >60 mL/min (ref >60)

## 2017-06-06 LAB — HEMOGLOBIN AND HEMATOCRIT, BLOOD
HCT: 38.3 % (ref 36.0–46.0)
Hemoglobin: 12.5 g/dL (ref 12.0–15.0)

## 2017-06-06 LAB — GLUCOSE, CAPILLARY
Glucose-Capillary: 93 mg/dL (ref 65–99)
Glucose-Capillary: 192 mg/dL — ABNORMAL HIGH (ref 65–99)
Glucose-Capillary: 178 mg/dL — ABNORMAL HIGH (ref 65–99)
Glucose-Capillary: 208 mg/dL — ABNORMAL HIGH (ref 65–99)
Glucose-Capillary: 189 mg/dL — ABNORMAL HIGH (ref 65–99)

## 2017-06-06 SURGERY — LAPAROSCOPIC ROUX-EN-Y GASTRIC BYPASS WITH UPPER ENDOSCOPY
Anesthesia: General | Site: Abdomen

## 2017-06-06 MED ORDER — ROCURONIUM BROMIDE 50 MG/5ML IV SOSY
PREFILLED_SYRINGE | INTRAVENOUS | Status: AC
  Filled 2017-06-06: qty 5

## 2017-06-06 MED ORDER — KETAMINE HCL 10 MG/ML IJ SOLN
INTRAMUSCULAR | Status: AC
  Filled 2017-06-06: qty 1

## 2017-06-06 MED ORDER — SUCCINYLCHOLINE CHLORIDE 200 MG/10ML IV SOSY
PREFILLED_SYRINGE | INTRAVENOUS | Status: AC
  Filled 2017-06-06: qty 10

## 2017-06-06 MED ORDER — ACETAMINOPHEN 160 MG/5ML PO SOLN: 650.0000 mg | ORAL | Status: DC | PRN

## 2017-06-06 MED ORDER — LIP MEDEX EX OINT
TOPICAL_OINTMENT | CUTANEOUS | Status: AC
  Filled 2017-06-06: qty 7

## 2017-06-06 MED ORDER — KETAMINE HCL 10 MG/ML IJ SOLN
INTRAMUSCULAR | Status: DC | PRN
  Administered 2017-06-06 (×2): 25 mg via INTRAVENOUS

## 2017-06-06 MED ORDER — SCOPOLAMINE 1 MG/3DAYS TD PT72
MEDICATED_PATCH | TRANSDERMAL | Status: AC
  Administered 2017-06-06: 1.5 mg via TRANSDERMAL
  Filled 2017-06-06: qty 1

## 2017-06-06 MED ORDER — PANTOPRAZOLE SODIUM 40 MG IV SOLR
40.0000 mg | Freq: Every day | INTRAVENOUS | Status: DC
  Administered 2017-06-06: 40 mg via INTRAVENOUS
  Filled 2017-06-06: qty 40

## 2017-06-06 MED ORDER — HYDROMORPHONE HCL 1 MG/ML IJ SOLN
INTRAMUSCULAR | Status: AC
  Filled 2017-06-06: qty 2

## 2017-06-06 MED ORDER — SUFENTANIL CITRATE 50 MCG/ML IV SOLN
INTRAVENOUS | Status: AC
  Filled 2017-06-06: qty 1

## 2017-06-06 MED ORDER — PHENYLEPHRINE 40 MCG/ML (10ML) SYRINGE FOR IV PUSH (FOR BLOOD PRESSURE SUPPORT)
PREFILLED_SYRINGE | INTRAVENOUS | Status: DC | PRN
  Administered 2017-06-06: 40 ug via INTRAVENOUS
  Administered 2017-06-06 (×2): 80 ug via INTRAVENOUS
  Administered 2017-06-06: 40 ug via INTRAVENOUS

## 2017-06-06 MED ORDER — PROPOFOL 10 MG/ML IV BOLUS
INTRAVENOUS | Status: DC | PRN
  Administered 2017-06-06: 130 mg via INTRAVENOUS

## 2017-06-06 MED ORDER — ROCURONIUM BROMIDE 10 MG/ML (PF) SYRINGE
PREFILLED_SYRINGE | INTRAVENOUS | Status: DC | PRN
  Administered 2017-06-06: 10 mg via INTRAVENOUS
  Administered 2017-06-06 (×2): 20 mg via INTRAVENOUS
  Administered 2017-06-06: 10 mg via INTRAVENOUS
  Administered 2017-06-06: 50 mg via INTRAVENOUS
  Administered 2017-06-06: 10 mg via INTRAVENOUS

## 2017-06-06 MED ORDER — DEXAMETHASONE SODIUM PHOSPHATE 10 MG/ML IJ SOLN
INTRAMUSCULAR | Status: DC | PRN
  Administered 2017-06-06: 10 mg via INTRAVENOUS

## 2017-06-06 MED ORDER — CEFOTETAN DISODIUM-DEXTROSE 2-2.08 GM-%(50ML) IV SOLR
INTRAVENOUS | Status: AC
  Filled 2017-06-06: qty 50

## 2017-06-06 MED ORDER — HYDROMORPHONE HCL 1 MG/ML IJ SOLN: 0.2500 mg | INTRAMUSCULAR | Status: DC | PRN

## 2017-06-06 MED ORDER — HYDRALAZINE HCL 20 MG/ML IJ SOLN: 10.0000 mg | INTRAMUSCULAR | Status: DC | PRN

## 2017-06-06 MED ORDER — GABAPENTIN 250 MG/5ML PO SOLN
200.0000 mg | Freq: Two times a day (BID) | ORAL | Status: DC
  Administered 2017-06-06 – 2017-06-07 (×2): 200 mg via ORAL
  Filled 2017-06-06 (×3): qty 4

## 2017-06-06 MED ORDER — MIDAZOLAM HCL 5 MG/5ML IJ SOLN
INTRAMUSCULAR | Status: DC | PRN
  Administered 2017-06-06: 2 mg via INTRAVENOUS

## 2017-06-06 MED ORDER — SODIUM CHLORIDE 0.9 % IJ SOLN
INTRAMUSCULAR | Status: AC
  Filled 2017-06-06: qty 10

## 2017-06-06 MED ORDER — INSULIN ASPART 100 UNIT/ML ~~LOC~~ SOLN
0.0000 [IU] | SUBCUTANEOUS | Status: DC
  Administered 2017-06-06: 2 [IU] via SUBCUTANEOUS
  Administered 2017-06-06: 3 [IU] via SUBCUTANEOUS
  Administered 2017-06-06: 5 [IU] via SUBCUTANEOUS
  Administered 2017-06-07 (×3): 2 [IU] via SUBCUTANEOUS

## 2017-06-06 MED ORDER — DEXAMETHASONE SODIUM PHOSPHATE 4 MG/ML IJ SOLN: 4.0000 mg | INTRAMUSCULAR | Status: DC

## 2017-06-06 MED ORDER — LIDOCAINE 2% (20 MG/ML) 5 ML SYRINGE
INTRAMUSCULAR | Status: AC
  Filled 2017-06-06: qty 5

## 2017-06-06 MED ORDER — CHLORHEXIDINE GLUCONATE CLOTH 2 % EX PADS: 6.0000 | MEDICATED_PAD | Freq: Once | CUTANEOUS | Status: DC

## 2017-06-06 MED ORDER — EPHEDRINE SULFATE-NACL 50-0.9 MG/10ML-% IV SOSY
PREFILLED_SYRINGE | INTRAVENOUS | Status: DC | PRN
  Administered 2017-06-06 (×3): 5 mg via INTRAVENOUS
  Administered 2017-06-06: 10 mg via INTRAVENOUS
  Administered 2017-06-06: 5 mg via INTRAVENOUS

## 2017-06-06 MED ORDER — ONDANSETRON HCL 4 MG/2ML IJ SOLN: 4.0000 mg | INTRAMUSCULAR | Status: DC | PRN

## 2017-06-06 MED ORDER — SUFENTANIL CITRATE 50 MCG/ML IV SOLN
INTRAVENOUS | Status: DC | PRN
  Administered 2017-06-06: 10 ug via INTRAVENOUS
  Administered 2017-06-06: 5 ug via INTRAVENOUS

## 2017-06-06 MED ORDER — ONDANSETRON HCL 4 MG/2ML IJ SOLN
INTRAMUSCULAR | Status: DC | PRN
  Administered 2017-06-06: 4 mg via INTRAVENOUS

## 2017-06-06 MED ORDER — HEPARIN SODIUM (PORCINE) 5000 UNIT/ML IJ SOLN
5000.0000 [IU] | Freq: Three times a day (TID) | INTRAMUSCULAR | Status: DC
  Administered 2017-06-06 – 2017-06-07 (×4): 5000 [IU] via SUBCUTANEOUS
  Filled 2017-06-06 (×5): qty 1

## 2017-06-06 MED ORDER — 0.9 % SODIUM CHLORIDE (POUR BTL) OPTIME
TOPICAL | Status: DC | PRN
  Administered 2017-06-06: 1000 mL

## 2017-06-06 MED ORDER — SUGAMMADEX SODIUM 200 MG/2ML IV SOLN
INTRAVENOUS | Status: AC
  Filled 2017-06-06: qty 2

## 2017-06-06 MED ORDER — ACETAMINOPHEN 10 MG/ML IV SOLN
INTRAVENOUS | Status: AC
  Filled 2017-06-06: qty 100

## 2017-06-06 MED ORDER — SODIUM CHLORIDE 0.9 % IJ SOLN
INTRAMUSCULAR | Status: DC | PRN
  Administered 2017-06-06: 10 mL

## 2017-06-06 MED ORDER — OXYCODONE HCL 5 MG/5ML PO SOLN
5.0000 mg | ORAL | Status: DC | PRN
  Administered 2017-06-06: 5 mg via ORAL
  Filled 2017-06-06: qty 5

## 2017-06-06 MED ORDER — HEPARIN SODIUM (PORCINE) 5000 UNIT/ML IJ SOLN
5000.0000 [IU] | INTRAMUSCULAR | Status: AC
  Administered 2017-06-06: 5000 [IU] via SUBCUTANEOUS

## 2017-06-06 MED ORDER — TISSEEL VH 10 ML EX KIT
PACK | CUTANEOUS | Status: DC | PRN
  Administered 2017-06-06: 20 mL

## 2017-06-06 MED ORDER — ONDANSETRON HCL 4 MG/2ML IJ SOLN
INTRAMUSCULAR | Status: AC
  Filled 2017-06-06: qty 2

## 2017-06-06 MED ORDER — LACTATED RINGERS IV SOLN
INTRAVENOUS | Status: DC
  Administered 2017-06-06: 06:00:00 via INTRAVENOUS

## 2017-06-06 MED ORDER — KCL IN DEXTROSE-NACL 20-5-0.45 MEQ/L-%-% IV SOLN
INTRAVENOUS | Status: DC
  Administered 2017-06-06: 19:00:00 via INTRAVENOUS
  Administered 2017-06-07: 1000 mL via INTRAVENOUS
  Administered 2017-06-07: 14:00:00 via INTRAVENOUS
  Filled 2017-06-06 (×6): qty 1000

## 2017-06-06 MED ORDER — BUPIVACAINE LIPOSOME 1.3 % IJ SUSP
20.0000 mL | Freq: Once | INTRAMUSCULAR | Status: AC
  Administered 2017-06-06: 20 mL
  Filled 2017-06-06: qty 20

## 2017-06-06 MED ORDER — GABAPENTIN 300 MG PO CAPS
ORAL_CAPSULE | ORAL | Status: AC
  Administered 2017-06-06: 300 mg via ORAL
  Filled 2017-06-06: qty 1

## 2017-06-06 MED ORDER — MORPHINE SULFATE (PF) 4 MG/ML IV SOLN: 1.0000 mg | INTRAVENOUS | Status: DC | PRN

## 2017-06-06 MED ORDER — PROPOFOL 10 MG/ML IV BOLUS
INTRAVENOUS | Status: AC
  Filled 2017-06-06: qty 20

## 2017-06-06 MED ORDER — LIDOCAINE 2% (20 MG/ML) 5 ML SYRINGE
INTRAMUSCULAR | Status: DC | PRN
  Administered 2017-06-06: 60 mg via INTRAVENOUS

## 2017-06-06 MED ORDER — CEFOTETAN DISODIUM-DEXTROSE 2-2.08 GM-%(50ML) IV SOLR
2.0000 g | INTRAVENOUS | Status: AC
  Administered 2017-06-06: 2 g via INTRAVENOUS

## 2017-06-06 MED ORDER — MIDAZOLAM HCL 2 MG/2ML IJ SOLN
INTRAMUSCULAR | Status: AC
  Filled 2017-06-06: qty 2

## 2017-06-06 MED ORDER — APREPITANT 40 MG PO CAPS
40.0000 mg | ORAL_CAPSULE | ORAL | Status: AC
  Administered 2017-06-06: 40 mg via ORAL
  Filled 2017-06-06: qty 1

## 2017-06-06 MED ORDER — GLYCOPYRROLATE 0.2 MG/ML IV SOSY
PREFILLED_SYRINGE | INTRAVENOUS | Status: DC | PRN
  Administered 2017-06-06: 0.4 mg via INTRAVENOUS

## 2017-06-06 MED ORDER — PREMIER PROTEIN SHAKE
2.0000 [oz_av] | ORAL | Status: DC
  Administered 2017-06-07 (×5): 2 [oz_av] via ORAL

## 2017-06-06 MED ORDER — HEPARIN SODIUM (PORCINE) 5000 UNIT/ML IJ SOLN
INTRAMUSCULAR | Status: AC
  Administered 2017-06-06: 5000 [IU] via SUBCUTANEOUS
  Filled 2017-06-06: qty 1

## 2017-06-06 MED ORDER — TISSEEL VH 10 ML EX KIT
PACK | CUTANEOUS | Status: AC
  Filled 2017-06-06: qty 2

## 2017-06-06 MED ORDER — LIDOCAINE 2% (20 MG/ML) 5 ML SYRINGE
INTRAMUSCULAR | Status: DC | PRN
  Administered 2017-06-06: 1 mg/kg/h via INTRAVENOUS

## 2017-06-06 MED ORDER — ACETAMINOPHEN 10 MG/ML IV SOLN
INTRAVENOUS | Status: DC | PRN
  Administered 2017-06-06: 1000 mg via INTRAVENOUS

## 2017-06-06 MED ORDER — SCOPOLAMINE 1 MG/3DAYS TD PT72
1.0000 | MEDICATED_PATCH | TRANSDERMAL | Status: DC
  Administered 2017-06-06: 1.5 mg via TRANSDERMAL

## 2017-06-06 MED ORDER — PHENYLEPHRINE 40 MCG/ML (10ML) SYRINGE FOR IV PUSH (FOR BLOOD PRESSURE SUPPORT)
PREFILLED_SYRINGE | INTRAVENOUS | Status: AC
  Filled 2017-06-06: qty 10

## 2017-06-06 MED ORDER — DEXAMETHASONE SODIUM PHOSPHATE 10 MG/ML IJ SOLN
INTRAMUSCULAR | Status: AC
  Filled 2017-06-06: qty 1

## 2017-06-06 MED ORDER — GABAPENTIN 300 MG PO CAPS
300.0000 mg | ORAL_CAPSULE | ORAL | Status: AC
  Administered 2017-06-06: 300 mg via ORAL

## 2017-06-06 MED ORDER — SUGAMMADEX SODIUM 200 MG/2ML IV SOLN
INTRAVENOUS | Status: DC | PRN
  Administered 2017-06-06: 200 mg via INTRAVENOUS

## 2017-06-06 MED ORDER — LACTATED RINGERS IR SOLN
Status: DC | PRN
  Administered 2017-06-06: 1000 mL

## 2017-06-06 MED ORDER — ONDANSETRON HCL 4 MG/2ML IJ SOLN: 4.0000 mg | Freq: Four times a day (QID) | INTRAMUSCULAR | Status: DC | PRN

## 2017-06-06 SURGICAL SUPPLY — 67 items
APPLIER CLIP ROT 10 11.4 M/L (STAPLE)
APPLIER CLIP ROT 13.4 12 LRG (CLIP)
APR CLP MED LRG 11.4X10 (STAPLE)
BENZOIN TINCTURE PRP APPL 2/3 (GAUZE/BANDAGES/DRESSINGS) IMPLANT
BLADE SURG 15 STRL LF DISP TIS (BLADE) ×1 IMPLANT
BLADE SURG 15 STRL SS (BLADE) ×1
CABLE HIGH FREQUENCY MONO STRZ (ELECTRODE) ×2 IMPLANT
CLIP APPLIE ROT 10 11.4 M/L (STAPLE) IMPLANT
CLIP APPLIE ROT 13.4 12 LRG (CLIP) IMPLANT
CLIP SUT LAPRA TY ABSORB (SUTURE) ×4 IMPLANT
DERMABOND ADVANCED (GAUZE/BANDAGES/DRESSINGS) ×1
DERMABOND ADVANCED .7 DNX12 (GAUZE/BANDAGES/DRESSINGS) ×1 IMPLANT
DEVICE PMI PUNCTURE CLOSURE (MISCELLANEOUS) ×2 IMPLANT
DEVICE SUT QUICK LOAD TK 5 (STAPLE) IMPLANT
DEVICE SUT TI-KNOT TK 5X26 (MISCELLANEOUS) IMPLANT
DEVICE SUTURE ENDOST 10MM (ENDOMECHANICALS) ×2 IMPLANT
DISSECTOR BLUNT TIP ENDO 5MM (MISCELLANEOUS) IMPLANT
DRAIN PENROSE 18X1/4 LTX STRL (WOUND CARE) ×2 IMPLANT
GAUZE SPONGE 4X4 12PLY STRL (GAUZE/BANDAGES/DRESSINGS) IMPLANT
GAUZE SPONGE 4X4 16PLY XRAY LF (GAUZE/BANDAGES/DRESSINGS) ×2 IMPLANT
GLOVE BIOGEL M 8.0 STRL (GLOVE) ×2 IMPLANT
GOWN STRL REUS W/TWL XL LVL3 (GOWN DISPOSABLE) ×12 IMPLANT
HANDLE STAPLE EGIA 4 XL (STAPLE) ×2 IMPLANT
HOVERMATT SINGLE USE (MISCELLANEOUS) ×2 IMPLANT
KIT BASIN OR (CUSTOM PROCEDURE TRAY) ×2 IMPLANT
KIT GASTRIC LAVAGE 34FR ADT (SET/KITS/TRAYS/PACK) ×2 IMPLANT
MARKER SKIN DUAL TIP RULER LAB (MISCELLANEOUS) ×2 IMPLANT
NEEDLE SPNL 22GX3.5 QUINCKE BK (NEEDLE) ×2 IMPLANT
PACK CARDIOVASCULAR III (CUSTOM PROCEDURE TRAY) ×2 IMPLANT
RELOAD EGIA 45 MED/THCK PURPLE (STAPLE) ×2 IMPLANT
RELOAD EGIA 45 TAN VASC (STAPLE) IMPLANT
RELOAD EGIA 60 MED/THCK PURPLE (STAPLE) IMPLANT
RELOAD EGIA 60 TAN VASC (STAPLE) ×4 IMPLANT
RELOAD ENDO STITCH 2.0 (ENDOMECHANICALS) ×14
RELOAD TRI 2.0 60 XTHK VAS SUL (STAPLE) ×4 IMPLANT
RELOAD TRI 45 ART MED THCK PUR (STAPLE) IMPLANT
RELOAD TRI 60 ART MED THCK PUR (STAPLE) IMPLANT
SCISSORS LAP 5X45 EPIX DISP (ENDOMECHANICALS) ×2 IMPLANT
SEALANT SURGICAL APPL DUAL CAN (MISCELLANEOUS) ×4 IMPLANT
SET IRRIG TUBING LAPAROSCOPIC (IRRIGATION / IRRIGATOR) ×2 IMPLANT
SHEARS HARMONIC ACE PLUS 45CM (MISCELLANEOUS) ×2 IMPLANT
SLEEVE ADV FIXATION 12X100MM (TROCAR) ×4 IMPLANT
SLEEVE ADV FIXATION 5X100MM (TROCAR) IMPLANT
SOLUTION ANTI FOG 6CC (MISCELLANEOUS) ×2 IMPLANT
STAPLER VISISTAT 35W (STAPLE) ×2 IMPLANT
STRIP CLOSURE SKIN 1/2X4 (GAUZE/BANDAGES/DRESSINGS) IMPLANT
SUT RELOAD ENDO STITCH 2 48X1 (ENDOMECHANICALS) ×5
SUT RELOAD ENDO STITCH 2.0 (ENDOMECHANICALS) ×4
SUT SURGIDAC NAB ES-9 0 48 120 (SUTURE) IMPLANT
SUT VIC AB 2-0 SH 27 (SUTURE) ×2
SUT VIC AB 2-0 SH 27X BRD (SUTURE) ×1 IMPLANT
SUT VIC AB 4-0 SH 18 (SUTURE) ×4 IMPLANT
SUTURE RELOAD END STTCH 2 48X1 (ENDOMECHANICALS) ×5 IMPLANT
SUTURE RELOAD ENDO STITCH 2.0 (ENDOMECHANICALS) ×4 IMPLANT
SYR 10ML ECCENTRIC (SYRINGE) ×2 IMPLANT
SYR 20CC LL (SYRINGE) ×4 IMPLANT
SYR 50ML LL SCALE MARK (SYRINGE) ×2 IMPLANT
TOWEL OR 17X26 10 PK STRL BLUE (TOWEL DISPOSABLE) ×4 IMPLANT
TOWEL OR NON WOVEN STRL DISP B (DISPOSABLE) ×2 IMPLANT
TROCAR ADV FIXATION 12X100MM (TROCAR) ×2 IMPLANT
TROCAR ADV FIXATION 5X100MM (TROCAR) ×2 IMPLANT
TROCAR BLADELESS 15MM (ENDOMECHANICALS) ×4 IMPLANT
TROCAR BLADELESS OPT 5 100 (ENDOMECHANICALS) ×2 IMPLANT
TROCAR XCEL 12X100 BLDLESS (ENDOMECHANICALS) ×2 IMPLANT
TUBING CONNECTING 10 (TUBING) ×4 IMPLANT
TUBING ENDO SMARTCAP PENTAX (MISCELLANEOUS) ×2 IMPLANT
TUBING INSUF HEATED (TUBING) ×2 IMPLANT

## 2017-06-06 NOTE — Discharge Instructions (Signed)
° ° ° °GASTRIC BYPASS/SLEEVE ° Home Care Instructions ° ° These instructions are to help you care for yourself when you go home. ° °Call: If you have any problems. °• Call [phone] and ask for the surgeon on call °• If you need immediate assistance come to the ER at [facility]. Tell the ER staff you are a new post-op gastric bypass or gastric sleeve patient  °Signs and symptoms to report: • Severe  vomiting or nausea °o If you cannot handle clear liquids for longer than 1 day, call your surgeon °• Abdominal pain which does not get better after taking your pain medication °• Fever greater than 100.4°  F and chills °• Heart rate over 100 beats a minute °• Trouble breathing °• Chest pain °• Redness,  swelling, drainage, or foul odor at incision (surgical) sites °• If your incisions open or pull apart °• Swelling or pain in calf (lower leg) °• Diarrhea (Loose bowel movements that happen often), frequent watery, uncontrolled bowel movements °• Constipation, (no bowel movements for 3 days) if this happens: °o Take Milk of Magnesia, 2 tablespoons by mouth, 3 times a day for 2 days if needed °o Stop taking Milk of Magnesia once you have had a bowel movement °o Call your doctor if constipation continues °Or °o Take Miralax  (instead of Milk of Magnesia) following the label instructions °o Stop taking Miralax once you have had a bowel movement °o Call your doctor if constipation continues °• Anything you think is “abnormal for you” °  °Normal side effects after surgery: • Unable to sleep at night or unable to concentrate °• Irritability °• Being tearful (crying) or depressed ° °These are common complaints, possibly related to your anesthesia, stress of surgery, and change in lifestyle, that usually go away a few weeks after surgery. If these feelings continue, call your medical doctor.  °Wound Care: You may have surgical glue, steri-strips, or staples over your incisions after surgery °• Surgical glue: Looks like clear  film over your incisions and will wear off a little at a time °• Steri-strips: Adhesive strips of tape over your incisions. You may notice a yellowish color on skin under the steri-strips. This is used to make the steri-strips stick better. Do not pull the steri-strips off - let them fall off °• Staples: Staples may be removed before you leave the hospital °o If you go home with staples, call [facility] for an appointment with your surgeon’s nurse to have staples removed 10 days after surgery, [phone] °• Showering: You may shower two (2) days after your surgery unless your surgeon tells you differently °o Wash gently around incisions with warm soapy water, rinse well, and gently pat dry °o If you have a drain (tube from your incision), you may need someone to hold this while you shower °o No tub baths until staples are removed and incisions are healed °  °Medications: • Medications should be liquid or crushed if larger than the size of a dime °• Extended release pills (medication that releases a little bit at a time through the  day) should not be crushed °• Depending on the size and number of medications you take, you may need to space (take a few throughout the day)/change the time you take your medications so that you do not over-fill your pouch (smaller stomach) °• Make sure you follow-up with you primary care physician to make medication changes needed during rapid weight loss and life -style changes °•   If you have diabetes, follow up with your doctor that orders your diabetes medication(s) within one week after surgery and check your blood sugar regularly ° °• Do not drive while taking narcotics (pain medications) ° °• Do not take acetaminophen (Tylenol) and Roxicet or Lortab Elixir at the same time since these pain medications contain acetaminophen °  °Diet:  °First 2 Weeks You will see the nutritionist about two (2) weeks after your surgery. The nutritionist will increase the types of  foods you can eat if you are handling liquids well: °• If you have severe vomiting or nausea and cannot handle clear liquids lasting longer than 1 day call your surgeon °Protein Shake °• Drink at least 2 ounces of shake 5-6 times per day °• Each serving of protein shakes (usually 8-12 ounces) should have a minimum of: °o 15 grams of protein °o And no more than 5 grams of carbohydrate °• Goal for protein each day: °o Men = 80 grams per day °o Women = 60 grams per day °  ° • Protein powder may be added to fluids such as non-fat milk or Lactaid milk or Soy milk (limit to 35 grams added protein powder per serving) ° °Hydration °• Slowly increase the amount of water and other clear liquids as tolerated (See Acceptable Fluids) °• Slowly increase the amount of protein shake as tolerated °• Sip fluids slowly and throughout the day °• May use sugar substitutes in small amounts (no more than 6-8 packets per day; i.e. Splenda) ° °Fluid Goal °• The first goal is to drink at least 8 ounces of protein shake/drink per day (or as directed by the nutritionist); some examples of protein shakes are Syntrax Nectar, Adkins Advantage, EAS Edge HP, and Unjury. - See handout from pre-op Bariatric Education Class: °o Slowly increase the amount of protein shake you drink as tolerated °o You may find it easier to slowly sip shakes throughout the day °o It is important to get your proteins in first °• Your fluid goal is to drink 64-100 ounces of fluid daily °o It may take a few weeks to build up to this  °• 32 oz. (or more) should be clear liquids °And °• 32 oz. (or more) should be full liquids (see below for examples) °• Liquids should not contain sugar, caffeine, or carbonation ° °Clear Liquids: °• Water of Sugar-free flavored water (i.e. Fruit H²O, Propel) °• Decaffeinated coffee or tea (sugar-free) °• Crystal lite, Wyler’s Lite, Minute Maid Lite °• Sugar-free Jell-O °• Bouillon or broth °• Sugar-free Popsicle:    - Less than 20 calories  each; Limit 1 per day ° °Full Liquids: °                  Protein Shakes/Drinks + 2 choices per day of other full liquids °• Full liquids must be: °o No More Than 12 grams of Carbs per serving °o No More Than 3 grams of Fat per serving °• Strained low-fat cream soup °• Non-Fat milk °• Fat-free Lactaid Milk °• Sugar-free yogurt (Dannon Lite & Fit, Greek yogurt) ° °  °Vitamins and Minerals • Start 1 day after surgery unless otherwise directed by your surgeon °• 2 Chewable Bariatric Multivitamin / Multimineral Supplement with iron °• Chewable Calcium Citrate with Vitamin D-3 °(Example: 3 Chewable Calcium  Plus 600 with Vitamin D-3) °o Take 500 mg three (3) times a day for a total of 1500 mg each day °o Do not take all 3 doses of calcium   at one time as it may cause constipation, and you can only absorb 500 mg at a time °o Do not mix multivitamins containing iron with calcium supplements;  take 2 hours apart °• Menstruating women and those at risk for anemia ( a blood disease that causes weakness) may need extra iron °o Talk to your doctor to see if you need more iron °• If you need extra iron: Total daily Iron recommendation (including Vitamins) is 50 to 100 mg Iron/day °• Do not stop taking or change any vitamins or minerals until you talk to your nutritionist or surgeon °• Your nutritionist and/or surgeon must approve all vitamin and mineral supplements °  °Activity and Exercise: It is important to continue walking at home. Limit your physical activity as instructed by your doctor. During this time, use these guidelines: °• Do not lift anything greater than ten  (10) pounds for at least two (2) weeks °• Do not go back to work or drive until your surgeon says you can °• You may have sex when you feel comfortable °o It is VERY important for female patients to use a reliable birth control method; fertility often increase after surgery °o Do not get pregnant for at least 18 months °• Start exercising as soon as your  doctor tells you that you can °o Make sure your doctor approves any physical activity °• Start with a simple walking program °• Walk 5-15 minutes each day, 7 days per week °• Slowly increase until you are walking 30-45 minutes per day °• Consider joining our BELT program. [phone] or email [email or web address] °  °Special Instructions Things to remember: °• Use your CPAP when sleeping if this applies to you °• Consider buying a medical alert bracelet that says you had lap-band surgery °  °  You will likely have your first fill (fluid added to your band) 6 - 8 weeks after surgery °• [facility] has a free Bariatric Surgery Support Group that meets monthly, the 3rd Thursday, 6pm. [facility] Classrooms. You can see classes online at [email or web address] °• It is very important to keep all follow up appointments with your surgeon, nutritionist, primary care physician, and behavioral health practitioner °o After the first year, please follow up with your bariatric surgeon and nutritionist at least once a year in order to maintain best weight loss results °      °             [facility]:  [phone] ° °             [facility]: [phone] ° °             Bariatric Nurse Coordinator: [phone]  °Gastric Bypass/Sleeve Home Care Instructions  Rev. 08/2012    ° °                                                    Reviewed and Endorsed °                                                   by [facility] Patient Education Committee, Jan, 2014 ° ° ° ° ° ° ° ° ° °

## 2017-06-06 NOTE — Transfer of Care (Signed)
Immediate Anesthesia Transfer of Care Note  Patient: [name]  Procedure(s) Performed: 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 (N/A Abdomen)  Patient Location: PACU  Anesthesia Type:General  Level of Consciousness: awake, alert , oriented and patient cooperative  Airway & Oxygen Therapy: Patient Spontanous Breathing and Patient connected to face mask oxygen  Post-op Assessment: Report given to RN, Post -op Vital signs reviewed and stable and Patient moving all extremities  Post vital signs: Reviewed and stable  Last Vitals:  Vitals:   06/06/17 0544  BP: 140/79  Pulse: 66  Resp: 16  Temp: 37.1 C  SpO2: 99%    Last Pain:  Vitals:   06/06/17 0544  TempSrc: Oral         Complications: No apparent anesthesia complications

## 2017-06-06 NOTE — Anesthesia Postprocedure Evaluation (Signed)
Anesthesia Post Note  Patient: ZILPHA MCANDREW  Procedure(s) Performed: 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 (N/A Abdomen)     Patient location during evaluation: PACU Anesthesia Type: General Level of consciousness: awake and alert Pain management: pain level controlled Vital Signs Assessment: post-procedure vital signs reviewed and stable Respiratory status: spontaneous breathing, nonlabored ventilation, respiratory function stable and patient connected to nasal cannula oxygen Cardiovascular status: blood pressure returned to baseline and stable Postop Assessment: no apparent nausea or vomiting Anesthetic complications: no    Last Vitals:  Vitals:   06/06/17 1400 06/06/17 1455  BP: 135/68 131/72  Pulse: 63 78  Resp: 13 14  Temp: 36.6 C 36.7 C  SpO2: 98% 96%    Last Pain:  Vitals:   06/06/17 1400  TempSrc:   PainSc: [place]

## 2017-06-06 NOTE — Anesthesia Procedure Notes (Signed)
Procedure Name: Intubation Date/Time: 06/06/2017 7:48 AM Performed by: Victoriano Lain, CRNA Pre-anesthesia Checklist: Patient identified, Emergency Drugs available, Suction available, Patient being monitored and Timeout performed Patient Re-evaluated:Patient Re-evaluated prior to induction Oxygen Delivery Method: Circle system utilized Preoxygenation: Pre-oxygenation with 100% oxygen Induction Type: IV induction Ventilation: Mask ventilation without difficulty Laryngoscope Size: Glidescope and 4 Grade View: Grade I Tube type: Parker flex tip Tube size: 7.5 mm Number of attempts: 1 Airway Equipment and Method: Video-laryngoscopy and Stylet Placement Confirmation: ETT inserted through vocal cords under direct vision,  positive ETCO2 and breath sounds checked- equal and bilateral Secured at: 21 cm Tube secured with: Tape Dental Injury: Teeth and Oropharynx as per pre-operative assessment  Comments: Elective Glidescope intubation with #4 blade due to history of cervical fusion. Easy mask. ATOI. Grade 1 view.

## 2017-06-06 NOTE — Op Note (Signed)
Surgeon: Althea Grimmer. Hassell Done, MD, FACS Asst:  Adonis Housekeeper, MD, FACS Anesthesia: General endotracheal Drains: None  Procedure: Laparoscopic Roux en Y gastric bypass with 40 cm BP limb and 100 cm Roux limb, antecolic, antegastric, candy cane to the left.  Closure of Peterson's defect. Upper endoscopy.   Description of Procedure:  The patient was taken to OR 1 at [facility] and given general anesthesia.  The abdomen was prepped with Technicare and draped sterilely.  A time out was performed.  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  The Sherrie Sport was place but couldn't be position until extensive enterolysis was performed to free the foregut.  This was done up to the diaphragm anteriorly.  I went ahead and made the pouch by measuring 5 cm along the lessor curvature and creating a retrogastric window.  The right lateral port (later closed with a 0 vicryl by PML) was a 15 and a black load Covidien without tissue reinforcement was used (x2) to transect the stomach from the distal limb of the sleeve.      We then began the distal part of the operation by identifying the ligament of Treitz. I measured 40 cm downstream and divided the bowel with a 6 cm Covidian stapler brown load.  I sutured a Penrose drain along the Roux limb end.  I measured a 1 meter (100 cm) Roux limb and then placed the distal bowels to the BP limb side by side and performed a stapled jejunojejunostomy. The common defect was closed from either end with 4-0 Vicryl using the Endo Stitch. The mesenteric defect was closed with a running 2-0 silk using the Endo Stitch. Tisseel was applied to the suture line.  The omentum was divided with the harmonic scalpel.  The Nathanson retractor was inserted in the left lateral segment of liver was retracted.   The Roux limb was then brought up with the candycane pointed left and a back row of sutures of 2-0 Vicryl were placed. I opened along the  right side of each structure and inserted the 4.5 cm stapler to create the gastrojejunostomy. The common defect was closed from either end with 2-0 Vicryl and a second row was placed anterior to that the Ewald tube acting as a stent across the anastomosis. The Penrose drain was removed. Peterson's defect was closed with 2-0 silk.   Endoscopy was performed by Dr. Excell Seltzer showed a 5 cm pouch, open anastomosis without bleeding or bubbles.  The incisions were injected with Exparel and were closed with 4-0 Monocryl and Dermabond.    The patient was taken to the recovery room in satisfactory condition.  [name] Done, MD, FACS

## 2017-06-06 NOTE — Progress Notes (Signed)
Discussed post op day goals with patient including ambulation, IS, diet progression, pain, and nausea control.  Patient sitting in recliner.  Questions answered.

## 2017-06-06 NOTE — Interval H&P Note (Signed)
History and Physical Interval Note:  06/06/2017 7:32 AM  [name]  has presented today for surgery, with the diagnosis of Morbid Obesity, DM II, GERD, HTN, Anxiety  The various methods of treatment have been discussed with the patient and family. After consideration of risks, benefits and other options for treatment, the patient has consented to  Procedure(s): Conversion from Sleeve to [place] (N/A) as a surgical intervention .  The patient's history has been reviewed, patient examined, no change in status, stable for surgery.  I have reviewed the patient's chart and labs.  Questions were answered to the patient's satisfaction.     Pedro Earls

## 2017-06-07 ENCOUNTER — Ambulatory Visit: Payer: 59 | Admitting: Podiatry

## 2017-06-07 LAB — CBC WITH DIFFERENTIAL/PLATELET
Basophils Absolute: 0 10*3/uL (ref 0.0–0.1)
Basophils Relative: 0 %
Eosinophils Absolute: 0 10*3/uL (ref 0.0–0.7)
Eosinophils Relative: 0 %
HCT: 36.1 % (ref 36.0–46.0)
Hemoglobin: 11.8 g/dL — ABNORMAL LOW (ref 12.0–15.0)
Lymphocytes Relative: 9 %
Lymphs Abs: 1.3 10*3/uL (ref 0.7–4.0)
MCH: 28.4 pg (ref 26.0–34.0)
MCHC: 32.7 g/dL (ref 30.0–36.0)
MCV: 86.8 fL (ref 78.0–100.0)
Monocytes Absolute: 1.2 10*3/uL — ABNORMAL HIGH (ref 0.1–1.0)
Monocytes Relative: 8 %
Neutro Abs: 12.9 10*3/uL — ABNORMAL HIGH (ref 1.7–7.7)
Neutrophils Relative %: 83 %
Platelets: 206 10*3/uL (ref 150–400)
RBC: 4.16 MIL/uL (ref 3.87–5.11)
RDW: 13.2 % (ref 11.5–15.5)
WBC: 15.4 10*3/uL — ABNORMAL HIGH (ref 4.0–10.5)

## 2017-06-07 LAB — GLUCOSE, CAPILLARY
Glucose-Capillary: 139 mg/dL — ABNORMAL HIGH (ref 65–99)
Glucose-Capillary: 128 mg/dL — ABNORMAL HIGH (ref 65–99)
Glucose-Capillary: 110 mg/dL — ABNORMAL HIGH (ref 65–99)
Glucose-Capillary: 126 mg/dL — ABNORMAL HIGH (ref 65–99)

## 2017-06-07 MED ORDER — ACETAMINOPHEN 10 MG/ML IV SOLN
1000.0000 mg | Freq: Four times a day (QID) | INTRAVENOUS | Status: DC
  Administered 2017-06-07 (×2): 1000 mg via INTRAVENOUS
  Filled 2017-06-07 (×2): qty 100

## 2017-06-07 NOTE — Plan of Care (Signed)
Nutrition Education Note  Received consult for diet education per DROP protocol.   Discussed 2 week post op diet with pt. Emphasized that liquids must be non carbonated, non caffeinated, and sugar free. Fluid goals discussed. Pt to follow up with outpatient bariatric RD for further diet progression after 2 weeks. Multivitamins and minerals also reviewed. Teach back method used, pt expressed understanding, expect good compliance.   Diet: First 2 Weeks  You will see the nutritionist about two (2) weeks after your surgery. The nutritionist will increase the types of foods you can eat if you are handling liquids well:  If you have severe vomiting or nausea and cannot handle clear liquids lasting longer than 1 day, call your surgeon  Protein Shake  Drink at least 2 ounces of shake 5-6 times per day  Each serving of protein shakes (usually 8 - 12 ounces) should have a minimum of:  15 grams of protein  And no more than 5 grams of carbohydrate  Goal for protein each day:  Men = 80 grams per day  Women = 60 grams per day  Protein powder may be added to fluids such as non-fat milk or Lactaid milk or Soy milk (limit to 35 grams added protein powder per serving)   Hydration  Slowly increase the amount of water and other clear liquids as tolerated (See Acceptable Fluids)  Slowly increase the amount of protein shake as tolerated  Sip fluids slowly and throughout the day  May use sugar substitutes in small amounts (no more than 6 - 8 packets per day; i.e. Splenda)   Fluid Goal  The first goal is to drink at least 8 ounces of protein shake/drink per day (or as directed by the nutritionist); some examples of protein shakes are Premier Protein, Syntrax Nectar, Adkins Advantage, EAS Edge HP, and Unjury. See handout from pre-op Bariatric Education Class:  Slowly increase the amount of protein shake you drink as tolerated  You may find it easier to slowly sip shakes throughout the day  It is important to  get your proteins in first  Your fluid goal is to drink 64 - 100 ounces of fluid daily  It may take a few weeks to build up to this  32 oz (or more) should be clear liquids  And  32 oz (or more) should be full liquids (see below for examples)  Liquids should not contain sugar, caffeine, or carbonation   Clear Liquids:  Water or Sugar-free flavored water (i.e. Fruit H2O, Propel)  Decaffeinated coffee or tea (sugar-free)  Crystal Lite, Wyler?s Lite, Minute Maid Lite  Sugar-free Jell-O  Bouillon or broth  Sugar-free Popsicle: *Less than 20 calories each; Limit 1 per day   Full Liquids:  Protein Shakes/Drinks + 2 choices per day of other full liquids  Full liquids must be:  No More Than 12 grams of Carbs per serving  No More Than 3 grams of Fat per serving  Strained low-fat cream soup  Non-Fat milk  Fat-free Lactaid Milk  Sugar-free yogurt (Dannon Lite & Fit, Greek yogurt, Oikos Zero)   [name] RD, LDN Clinical Nutrition Pager # - [phone]   

## 2017-06-07 NOTE — Progress Notes (Signed)
Patient alert and oriented, pain is controlled. Patient is tolerating fluids, advanced to protein shake today, patient is tolerating well. Reviewed Gastric Bypass discharge instructions with patient and patient is able to articulate understanding. Provided information on BELT program, Support Group and [facility] outpatient pharmacy. All questions answered, will continue to monitor.    

## 2017-06-07 NOTE — Discharge Summary (Signed)
Physician Discharge Summary  Patient ID: [name] MRN: [id number] DOB/AGE: 17-Dec-1955 61 y.o.  Admit date: 06/06/2017 Discharge date: 06/07/2017  Admission Diagnoses:  Failed sleeve with gER  Discharge Diagnoses:  same  Active Problems:   S/P gastric bypass   Surgery:  Conversion of sleeve gastrectomy to roux en Y gastric bypass surgery  Discharged Condition: improved  Hospital Course:   Had conversion on Monday morning.  Started on liquids and advanced.  Ready for discharge on Tuesday evening.    Consults: none  Significant Diagnostic Studies: none    Discharge Exam: Blood pressure (!) 145/67, pulse (!) 50, temperature 98.7 F (37.1 C), temperature source Oral, resp. rate 16, height '5\' 11"'  (1.803 m), weight 99 kg (218 lb 4.1 oz), SpO2 100 %. Incisions OK  Disposition: 01-Home or Self Care  Discharge Instructions    Ambulate hourly while awake   Complete by:  As directed    Call MD for:  difficulty breathing, headache or visual disturbances   Complete by:  As directed    Call MD for:  persistant dizziness or light-headedness   Complete by:  As directed    Call MD for:  persistant nausea and vomiting   Complete by:  As directed    Call MD for:  redness, tenderness, or signs of infection (pain, swelling, redness, odor or green/yellow discharge around incision site)   Complete by:  As directed    Call MD for:  severe uncontrolled pain   Complete by:  As directed    Call MD for:  temperature >101 F   Complete by:  As directed    Diet bariatric full liquid   Complete by:  As directed    Incentive spirometry   Complete by:  As directed    Perform hourly while awake     Allergies as of 06/07/2017      Reactions   Isoniazid    Severe SOB   Nitrofurantoin Shortness Of Breath   REACTION: whelps   Hctz [hydrochlorothiazide]    rash   Promethazine Hcl [promethazine Hcl]    IF GIVEN  IV-hallucinations     CAN TAKE PO PHENERGAN   Tamiflu [oseltamivir  Phosphate] Nausea And Vomiting   "I vomited within 30 minutes of taking it and was told I cannot ever take it again."   Macrolides And Ketolides    rash   Morphine    REACTION: severe vomiting   Percocet [oxycodone-acetaminophen]    REACTION: severe itching. Can take by mouth codeine   Roxicet [oxycodone-acetaminophen]    itching   Sulfamethoxazole-trimethoprim    Septra / Bactrim  --REACTION: rash   Fentanyl Hives, Rash   REACTION: whelps      Medication List    TAKE these medications   BAYER CONTOUR LINK MONITOR w/Device Kit Test blood sugar once daily DX code: E11.9   CALTRATE GUMMY BITES PO Take 2 each 2 (two) times daily by mouth.   citalopram 20 MG tablet Commonly known as:  CELEXA TAKE 1 TABLET (20 MG TOTAL) BY MOUTH EVERY EVENING.   cyclobenzaprine 10 MG tablet Commonly known as:  FLEXERIL TAKE 1 TABLET (10 MG TOTAL) BY MOUTH 3 TIMES DAILY AS NEEDED FOR MUSCLE SPASMS.   estradiol 0.1 MG/24HR patch Commonly known as:  VIVELLE-DOT Place 1 patch (0.1 mg total) onto the skin 2 (two) times a week. Monday and Thursday   furosemide 20 MG tablet Commonly known as:  LASIX TAKE 1 TABLET (20 MG TOTAL) BY  MOUTH EVERY MORNING. Notes to patient:  Monitor Blood Pressure Daily and keep a log for primary care physician.  Monitor for symptoms of dehydration.  You may need to make changes to your medications with rapid weight loss.     glucose blood test strip Test blood sugar once daily. Dx Code: E11.9   glucose blood test strip Commonly known as:  ONETOUCH VERIO Check blood sugar daily   loratadine 10 MG tablet Commonly known as:  CLARITIN Take 10 mg by mouth every evening.   metoCLOPramide 10 MG tablet Commonly known as:  REGLAN Take 10 mg by mouth at bedtime.   olmesartan 5 MG tablet Commonly known as:  BENICAR Take 1 tablet (5 mg total) by mouth daily. Notes to patient:  Monitor Blood Pressure Daily and keep a log for primary care physician.  You may need to make  changes to your medications with rapid weight loss.     ONE-A-DAY SCOOBY-DOO GUMMIES PO Take 1 tablet 2 (two) times daily by mouth.   ONETOUCH DELICA LANCETS FINE Misc check blood sugar daily   pantoprazole 40 MG tablet Commonly known as:  PROTONIX Take 1 tablet (40 mg total) by mouth daily. What changed:  when to take this   potassium chloride 10 MEQ tablet Commonly known as:  K-DUR,KLOR-CON TAKE 1 TABLET (10 MEQ TOTAL) BY MOUTH DAILY.   PREMARIN vaginal cream Generic drug:  conjugated estrogens Apply 1 application topically 2 (two) times a week.   ranitidine 150 MG tablet Commonly known as:  ZANTAC Take 1 tablet (150 mg total) by mouth 2 (two) times daily.   rosuvastatin 20 MG tablet Commonly known as:  CRESTOR TAKE 1 TABLET (20 MG TOTAL) BY MOUTH AT BEDTIME.   sucralfate 1 GM/10ML suspension Commonly known as:  CARAFATE Take 1 g at bedtime by mouth.   thiamine 100 MG tablet Commonly known as:  VITAMIN B-1 Take 100 mg daily by mouth.      Follow-up Information    Surgery, [place]. Go on 07/01/2017.   Specialty:  General Surgery Why:  at [place] information: [place] [id number] [phone]        Surgery, [place] Follow up.   Specialty:  General Surgery Contact information: [place] [phone] [phone]           Signed: Pedro Earls 06/07/2017, 2:27 PM

## 2017-06-07 NOTE — Progress Notes (Signed)
Reviewed and AVS with pt and spouse. They verbalized understanding and questions were answered to their satisfaction. Pt discharged home in stable condition w/ all belongings via [place].

## 2017-06-07 NOTE — Progress Notes (Signed)
Patient alert and oriented, Post op day 1.  Provided support and encouragement.  Encouraged pulmonary toilet, ambulation and small sips of liquids.  Completed 12 ounces of clear fluids and began protein shakes this am. All questions answered.  Will continue to monitor.

## 2017-06-09 ENCOUNTER — Telehealth: Payer: Self-pay

## 2017-06-09 ENCOUNTER — Telehealth (HOSPITAL_COMMUNITY): Payer: Self-pay

## 2017-06-09 NOTE — Telephone Encounter (Signed)
Follow up with bariatric surgical patient to discuss post discharge questions.   1.  Are you having any pain not relieved by pain medication?pain medication working  2.  How much fluid total fluid intake have you had in the last 24/48 hours?  46 ounces of fluid  3.  How much protein intake have you had in the last 24/48 hours?60 grams of protein  4.  Have you had any trouble making urine?no making a lot of urine  5.  Have you had nausea that has not been relieved by nausea medication?no problems  6.  Are you ambulating every hour?yes  7.  Are you passing gas or had a BM?yes liquid right now  8.  Do you know how to contact [place]? [facility]? [facility]?yes  9.  Are you taking your vitamins and calcium without difficulty?no problems  10. Tell me how your incision looks?  Any redness, open incision, or drainage?no problems look good

## 2017-06-09 NOTE — Telephone Encounter (Signed)
She had surgery with general surgeon---  We can wait until afler she has f/u with them

## 2017-06-09 NOTE — Telephone Encounter (Signed)
Dr. Etter Sjogren do you want me to schedile this patient for Hospital follow up visit.

## 2017-06-10 NOTE — Telephone Encounter (Signed)
Noted  

## 2017-06-13 ENCOUNTER — Other Ambulatory Visit (HOSPITAL_BASED_OUTPATIENT_CLINIC_OR_DEPARTMENT_OTHER): Payer: Self-pay | Admitting: Obstetrics and Gynecology

## 2017-06-13 DIAGNOSIS — Z1231 Encounter for screening mammogram for malignant neoplasm of breast: Secondary | ICD-10-CM

## 2017-06-14 ENCOUNTER — Ambulatory Visit (HOSPITAL_BASED_OUTPATIENT_CLINIC_OR_DEPARTMENT_OTHER): Payer: 59

## 2017-06-16 ENCOUNTER — Encounter (HOSPITAL_BASED_OUTPATIENT_CLINIC_OR_DEPARTMENT_OTHER): Payer: Self-pay

## 2017-06-16 ENCOUNTER — Encounter: Payer: Self-pay | Admitting: Podiatry

## 2017-06-16 ENCOUNTER — Ambulatory Visit: Payer: 59 | Admitting: Podiatry

## 2017-06-16 ENCOUNTER — Ambulatory Visit (HOSPITAL_BASED_OUTPATIENT_CLINIC_OR_DEPARTMENT_OTHER)
Admission: RE | Admit: 2017-06-16 | Discharge: 2017-06-16 | Disposition: A | Discharge status: Home / Self Care | Payer: 59 | Source: Ambulatory Visit | Attending: Obstetrics and Gynecology | Admitting: Obstetrics and Gynecology

## 2017-06-16 DIAGNOSIS — B351 Tinea unguium: Secondary | ICD-10-CM

## 2017-06-16 DIAGNOSIS — Z1231 Encounter for screening mammogram for malignant neoplasm of breast: Secondary | ICD-10-CM | POA: Diagnosis not present

## 2017-06-16 DIAGNOSIS — L600 Ingrowing nail: Secondary | ICD-10-CM | POA: Diagnosis not present

## 2017-06-16 DIAGNOSIS — M79671 Pain in right foot: Secondary | ICD-10-CM

## 2017-06-16 DIAGNOSIS — R234 Changes in skin texture: Secondary | ICD-10-CM

## 2017-06-16 DIAGNOSIS — R928 Other abnormal and inconclusive findings on diagnostic imaging of breast: Secondary | ICD-10-CM | POA: Diagnosis not present

## 2017-06-16 DIAGNOSIS — M79672 Pain in left foot: Secondary | ICD-10-CM | POA: Diagnosis not present

## 2017-06-16 NOTE — Progress Notes (Signed)
Subjective: 61 y.o. year old female patient presents complaining of painful toes from cracking skin along the border of nail plate both feet.  Recovering from recent Gastric surgery, November 2018.  Objective: Dermatologic: Thick yellow deformed nails x 10. Ingrown hallucal nails bilateral without infection or drainage. Thick dry skin with cracks in nail borders 1st and 2nd toes bilateral. Vascular: Pedal pulses are all palpable. Orthopedic: Contracted lesser digits bilateral. Neurologic: All epicritic and tactile sensations grossly intact.  Assessment: Dystrophic mycotic nails x 10. Ingrown nail both great toe. Fissuring callus both great toes.  Treatment: All mycotic nails, corns, calluses debrided.  May benefit from Vitamin A cream to prevent frequent cracks in skin. Patient picked up a jar of Vitamin A cream. Return in 3 months or sooner if needed.

## 2017-06-16 NOTE — Patient Instructions (Signed)
Seen for hypertrophic nails and dry cracking calluses. All nails and calluses debrided. May benefit from using Vitamin A cream. Return in 3 months or as needed.

## 2017-06-17 ENCOUNTER — Other Ambulatory Visit: Payer: Self-pay | Admitting: Obstetrics and Gynecology

## 2017-06-17 DIAGNOSIS — R928 Other abnormal and inconclusive findings on diagnostic imaging of breast: Secondary | ICD-10-CM

## 2017-06-21 ENCOUNTER — Ambulatory Visit
Admission: RE | Admit: 2017-06-21 | Discharge: 2017-06-21 | Disposition: A | Discharge status: Home / Self Care | Payer: 59 | Source: Ambulatory Visit | Attending: Obstetrics and Gynecology | Admitting: Obstetrics and Gynecology

## 2017-06-21 ENCOUNTER — Encounter: Payer: 59 | Attending: Surgery | Admitting: Skilled Nursing Facility1

## 2017-06-21 DIAGNOSIS — Z903 Acquired absence of stomach [part of]: Secondary | ICD-10-CM | POA: Insufficient documentation

## 2017-06-21 DIAGNOSIS — R928 Other abnormal and inconclusive findings on diagnostic imaging of breast: Secondary | ICD-10-CM

## 2017-06-21 DIAGNOSIS — Z713 Dietary counseling and surveillance: Secondary | ICD-10-CM | POA: Insufficient documentation

## 2017-06-21 DIAGNOSIS — N6312 Unspecified lump in the right breast, upper inner quadrant: Secondary | ICD-10-CM | POA: Diagnosis not present

## 2017-06-22 ENCOUNTER — Encounter: Payer: Self-pay | Admitting: Skilled Nursing Facility1

## 2017-06-22 NOTE — Progress Notes (Signed)
Bariatric Class:  Appt start time: 1530 end time:  1630.  2 Week Post-Operative Nutrition Class  Patient was seen on 06/21/2017 for Post[facility]ative [facility]   Surgery date: 06/06/2017 Surgery type: RYGB Start weight at [facility]: 262.6 Weight today: 200.2  TANITA  BODY COMP RESULTS  06/21/2017   BMI (kg/m^2) 27.9   Fat Mass (lbs) 90.8   Fat Free Mass (lbs) 109.4   Total Body Water (lbs) 78   The following the learning objectives were met by the patient during this course:  Identifies Phase 3A (Soft, High Proteins) Dietary Goals and will begin from 2 weeks post-operatively to 2 months post-operatively  Identifies appropriate sources of fluids and proteins   States protein recommendations and appropriate sources post-operatively  Identifies the need for appropriate texture modifications, mastication, and bite sizes when consuming solids  Identifies appropriate multivitamin and calcium sources post-operatively  Describes the need for physical activity post-operatively and will follow MD recommendations  States when to call healthcare provider regarding medication questions or post-operative complications  Handouts given during class include:  Phase 3A: Soft, High Protein Diet Handout  Follow-Up Plan: Patient will follow-up at [facility] in 6 weeks for 2 month post-op nutrition visit for diet advancement per MD.

## 2017-07-04 MED FILL — PANTOPRAZOLE SOD DR 40 MG T: 40 | 90 days supply | Qty: 90 | Fill #1

## 2017-07-11 ENCOUNTER — Ambulatory Visit: Payer: 59

## 2017-08-01 ENCOUNTER — Encounter: Payer: 59 | Attending: Surgery | Admitting: Skilled Nursing Facility1

## 2017-08-01 ENCOUNTER — Encounter: Payer: Self-pay | Admitting: Skilled Nursing Facility1

## 2017-08-01 DIAGNOSIS — Z713 Dietary counseling and surveillance: Secondary | ICD-10-CM | POA: Diagnosis not present

## 2017-08-01 DIAGNOSIS — E1151 Type 2 diabetes mellitus with diabetic peripheral angiopathy without gangrene: Secondary | ICD-10-CM

## 2017-08-01 DIAGNOSIS — Z903 Acquired absence of stomach [part of]: Secondary | ICD-10-CM | POA: Diagnosis not present

## 2017-08-01 DIAGNOSIS — IMO0002 Reserved for concepts with insufficient information to code with codable children: Secondary | ICD-10-CM

## 2017-08-01 DIAGNOSIS — E1165 Type 2 diabetes mellitus with hyperglycemia: Secondary | ICD-10-CM

## 2017-08-01 NOTE — Patient Instructions (Addendum)
-  You only need 3 calciums in a day  -Aim to have vegetables with every lunch and every dinner

## 2017-08-01 NOTE — Progress Notes (Signed)
  Follow-up visit:  8 Weeks Post-Operative RYGB From Sleeve Surgery  Primary concerns today: Post-operative Bariatric Surgery Nutrition Management.   Pt states she is no longer having any reflux issues. Pt states she has just gone back to work due to needing a longer surgery due to adhesions and hernia. Pt states for a few weeks she had cramping and feeling like she was going to pass out every time she had a bowel movement but resolved currently. Pt states she does not like fish having tried flounder. Pt states her sleep is getting better.   Surgery date: 06/06/2017 Surgery type: RYGB Start weight at [facility]: 262.6 Weight today: 198.6  TANITA  BODY COMP RESULTS  06/21/2017 08/01/2017   BMI (kg/m^2) 27.9 27.7   Fat Mass (lbs) 90.8 88.6   Fat Free Mass (lbs) 109.4 110   Total Body Water (lbs) 78 78.6   24-hr recall: working 10-12 hours  B (AM): scrambled egg and cheese or protein shake Snk (AM): yogurt L (1PM): 3 oz  grilled chicken and cheese Snk (PM): yogurt D (PM): wendys chili or [place] with cheese Snk (PM):   Fluid intake: hint water, water, sugar free natures twist: 50-64 oz Estimated total protein intake: 60+  Medications: See List Supplementation: multi (can't remember the name) and calcium   CBG monitoring: 2-3 times a day Average CBG per patient: 76,90,96 Last patient reported A1c:   Using straws: no Drinking while eating: no Having you been chewing well:no Chewing/swallowing difficulties: no Changes in vision: no Changes to mood/headaches: no Hair loss/Cahnges to skin/Changes to nails: no Any difficulty focusing or concentrating: no Sweating: no Dizziness/Lightheaded: no Palpitations: no  Carbonated beverages: no N/V/D/C/GAS: no Abdominal Pain: no Dumping syndrome: no  Recent physical activity:  ADL's  Progress Towards Goal(s):  In progress.  Handouts given during visit include:  Non-starchy vegetables + protein   Nutritional Diagnosis:  [place]-3.3  Overweight/obesity related to past poor dietary habits and physical inactivity as evidenced by patient w/ recent RYGB surgery following dietary guidelines for continued weight loss.  Intervention:  Nutrition counseling. Goals: -You only need 3 calciums in a day -Aim to have vegetables with every lunch and every dinner  Teaching Method Utilized:  Visual Auditory Hands on  Barriers to learning/adherence to lifestyle change: none identified   Demonstrated degree of understanding via:  Teach Back   Monitoring/Evaluation:  Dietary intake, exercise, and body weight.

## 2017-08-09 IMAGING — CR DG KNEE COMPLETE 4+V*R*
4 series · 4 of 4 positions shown · non-contrast
Comparison: None.

CLINICAL DATA: Fell onto knee today. Pain and swelling. Initial
encounter. Remote history of right knee surgery.

EXAM:
RIGHT KNEE - COMPLETE 4+ VIEW

[t knee ap right]
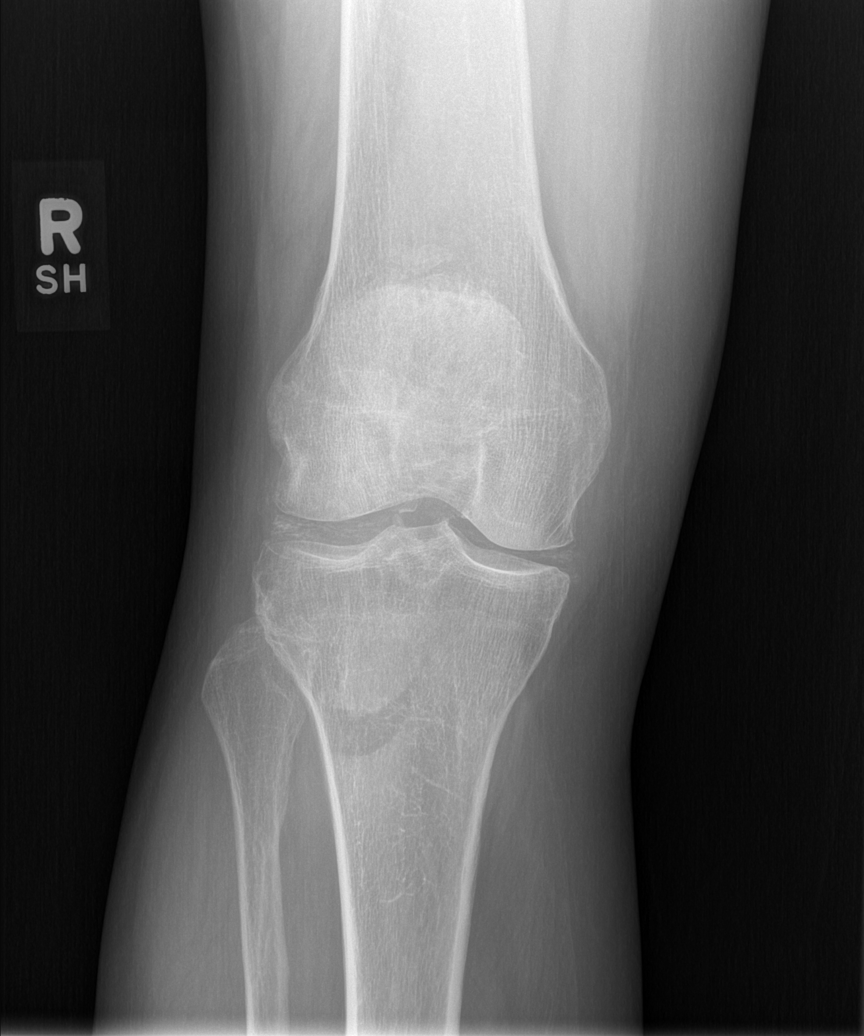

[t knee oblique right (1 of 2)]
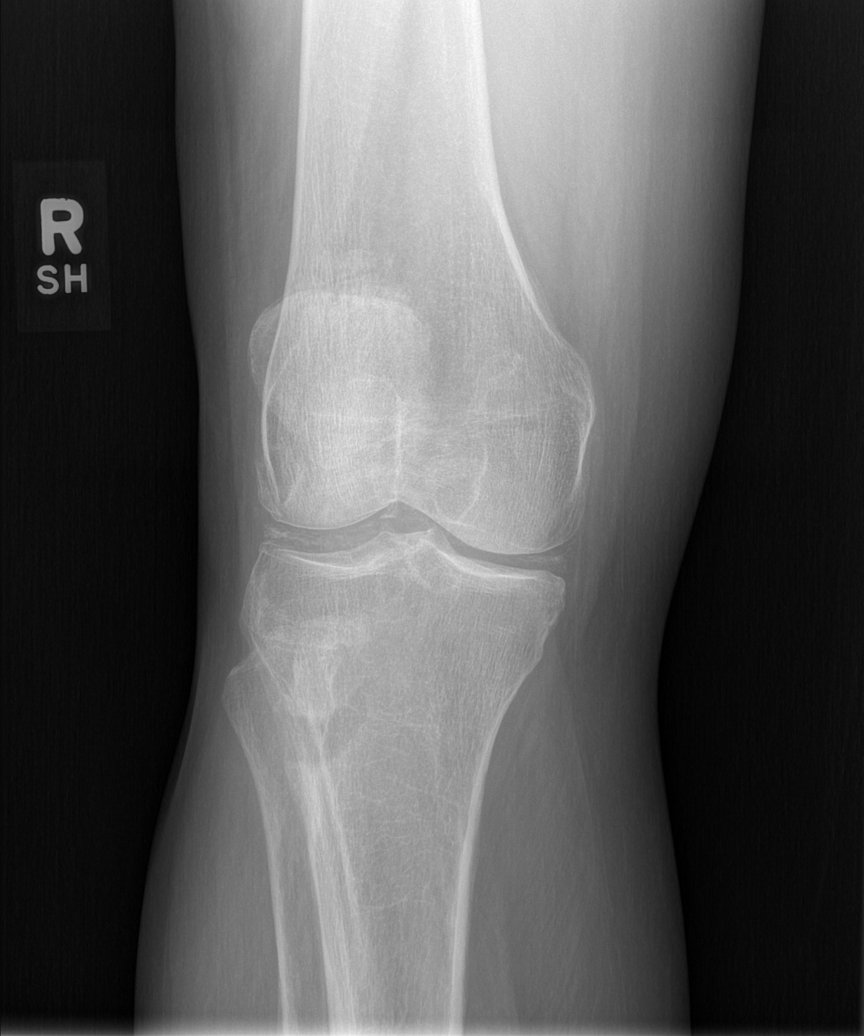

[t knee oblique right (2 of 2)]
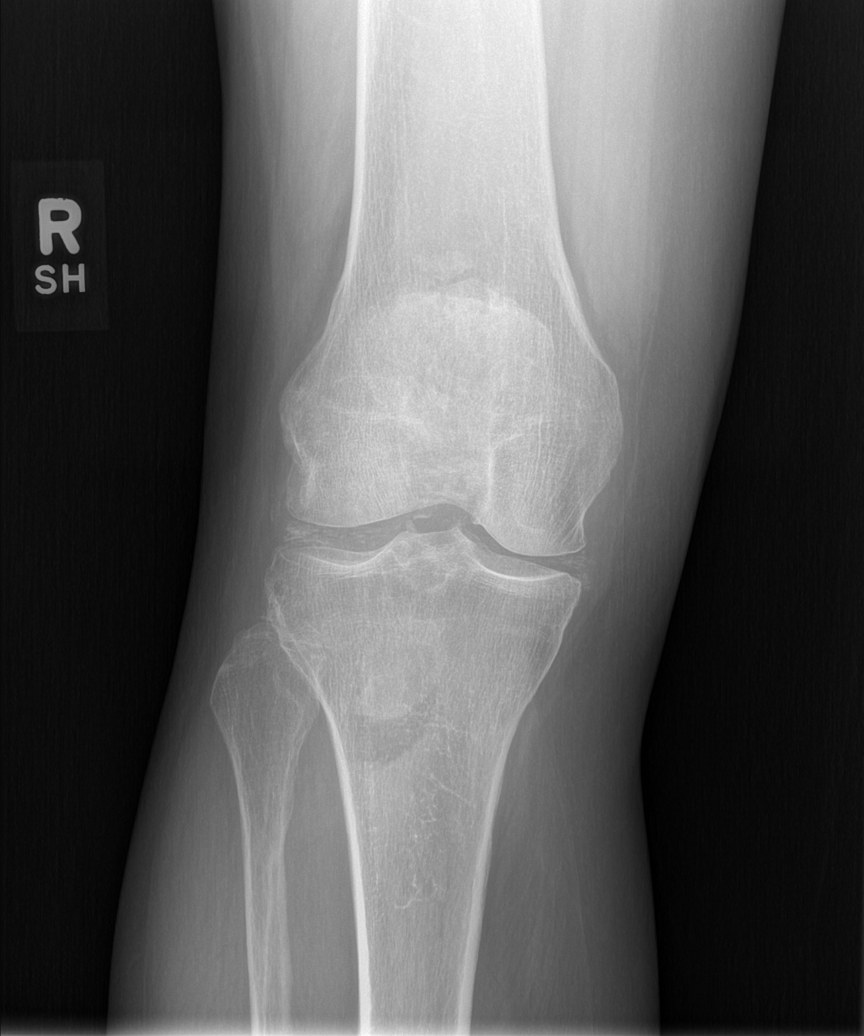

[t knee lat right]
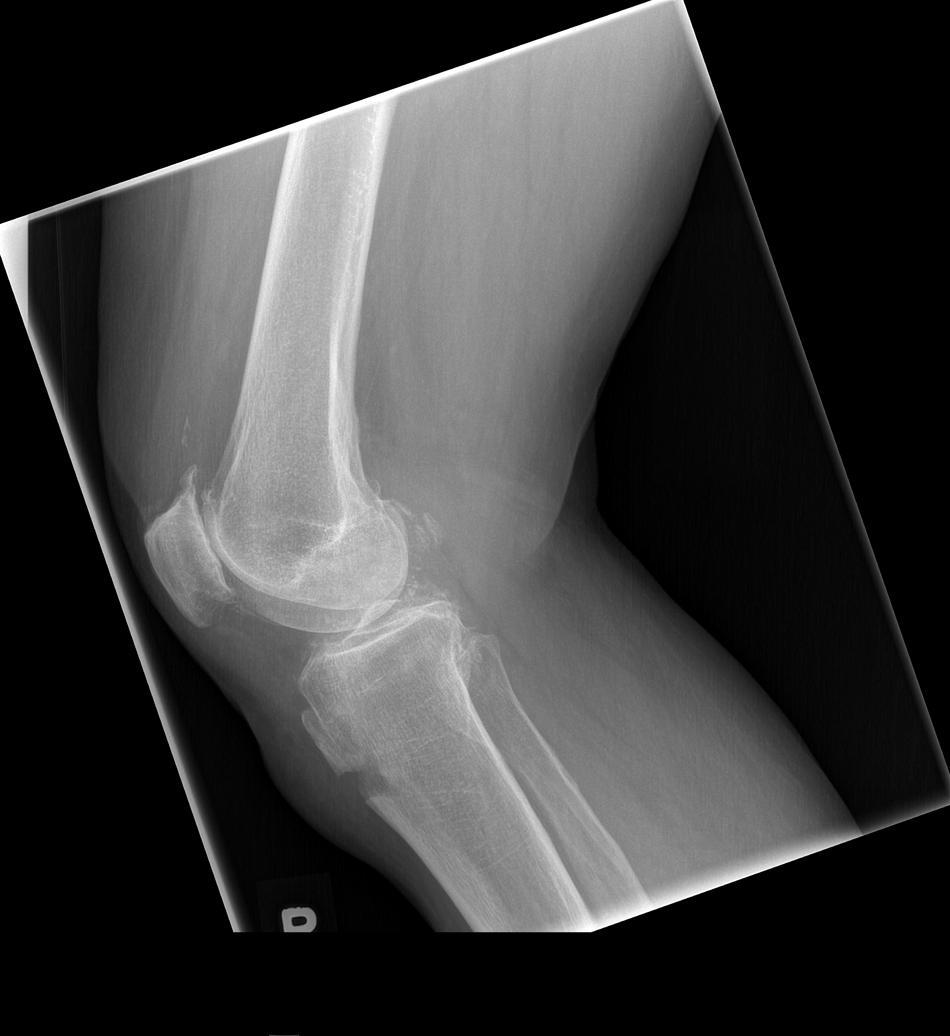

[4 of 4 positions shown; findings below may reference images not displayed]

FINDINGS: There is no evidence of knee joint effusion or dislocation. There is
a well-defined defect in the anterior proximal tibia just distal to
the tibial tuberosity which has an appearance more suggestive of a
postsurgical osteotomy defect rather than an avulsion fracture.
There is swelling involving the anterior soft tissues at this level.
There is no other evidence of fracture. Medial and lateral
compartment chondrocalcinosis is noted without significant
associated femorotibial joint space loss. There is moderate
patellofemoral osteophytosis with evidence of joint space narrowing.
IMPRESSION: 1. No definite evidence of acute osseous abnormality. Defect in the
proximal tibia is likely postsurgical rather than reflecting an
acute fracture.
2. Soft tissue swelling anterior to the proximal tibia.
3. Medial and lateral compartment chondrocalcinosis.
4. Patellofemoral osteoarthrosis.

## 2017-08-09 IMAGING — CR DG TIBIA/FIBULA 2V*R*
4 series · 4 of 4 positions shown · non-contrast
Comparison: None.

CLINICAL DATA: Fell onto knee today. Pain and swelling. Initial
encounter. Remote history of right knee surgery.

EXAM:
RIGHT TIBIA AND FIBULA - 2 VIEW

[t tib/fib ap right (1 of 2)]
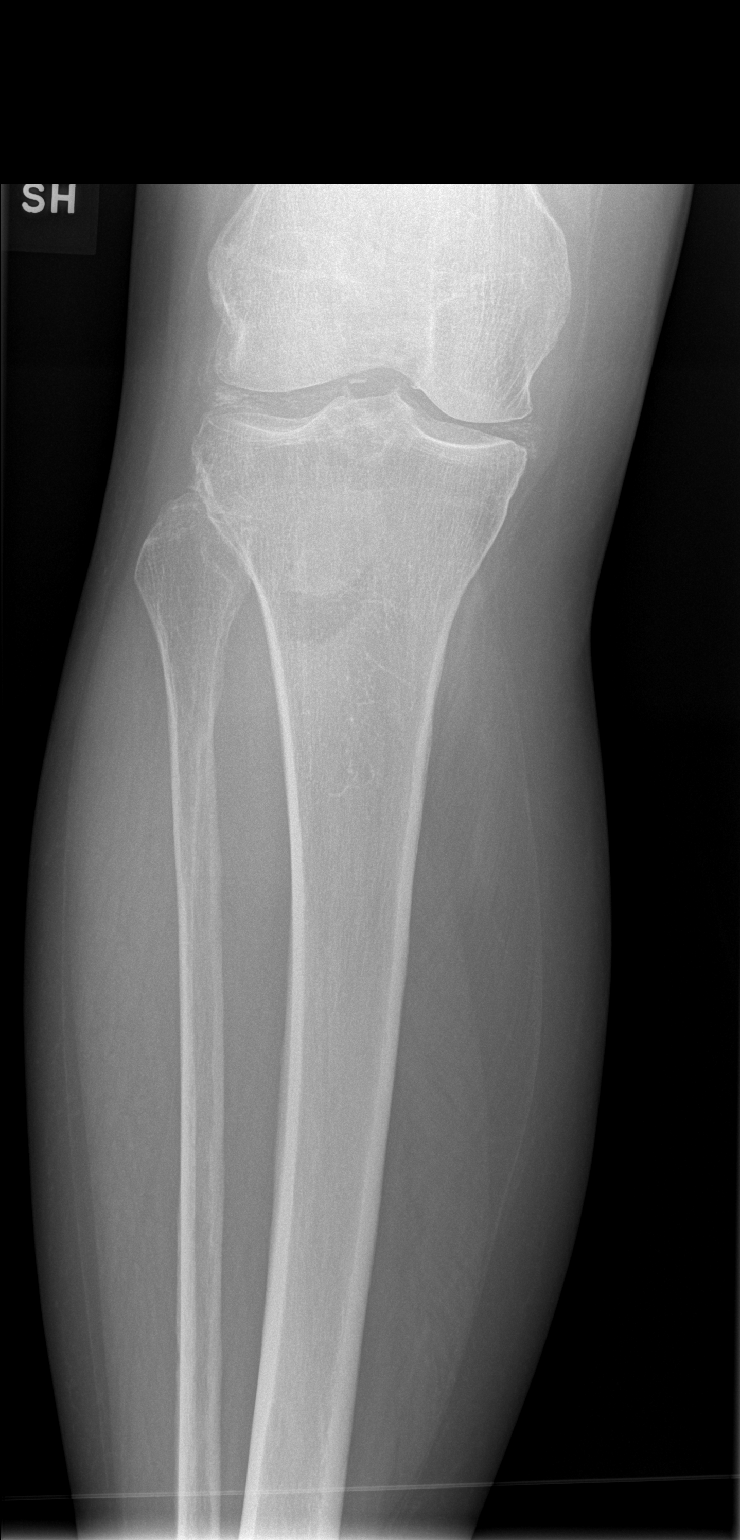

[t tib/fib ap right (2 of 2)]
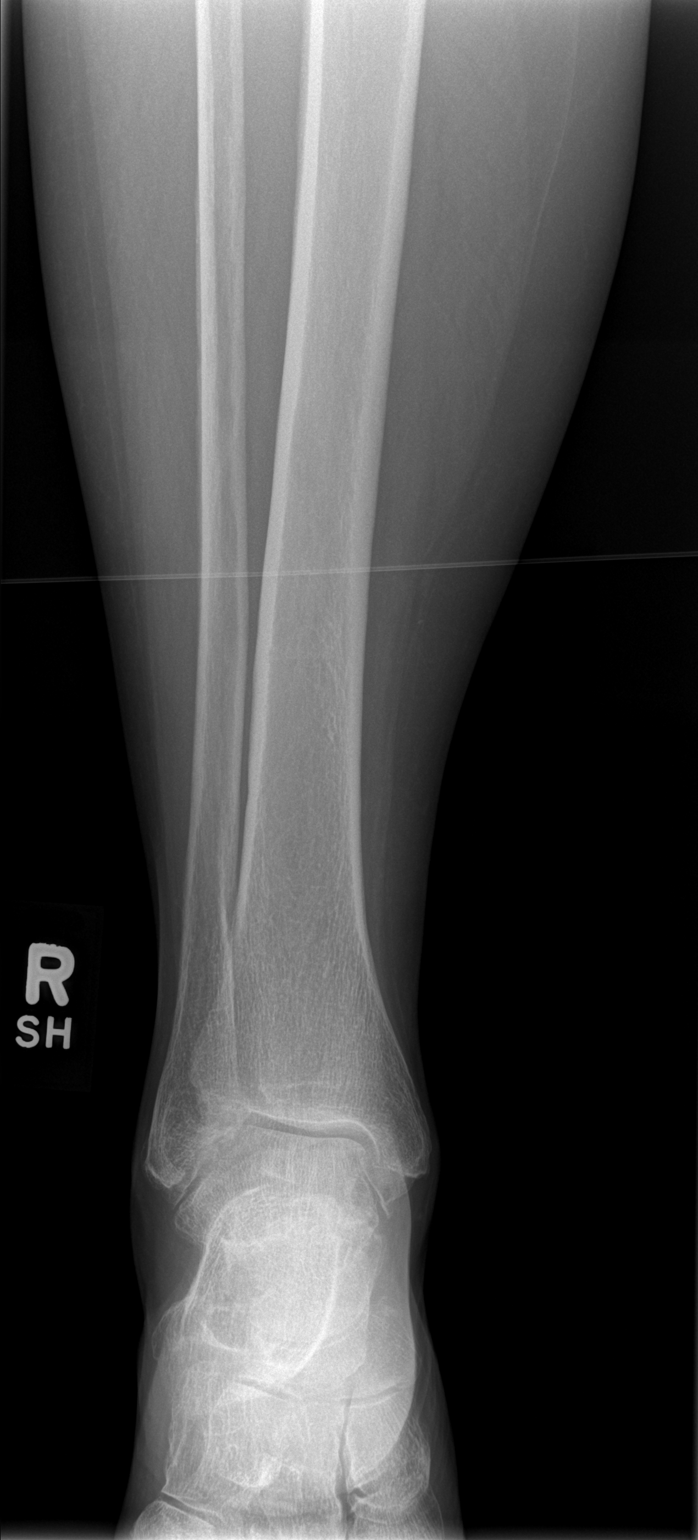

[t tib/fib lat right (1 of 2)]
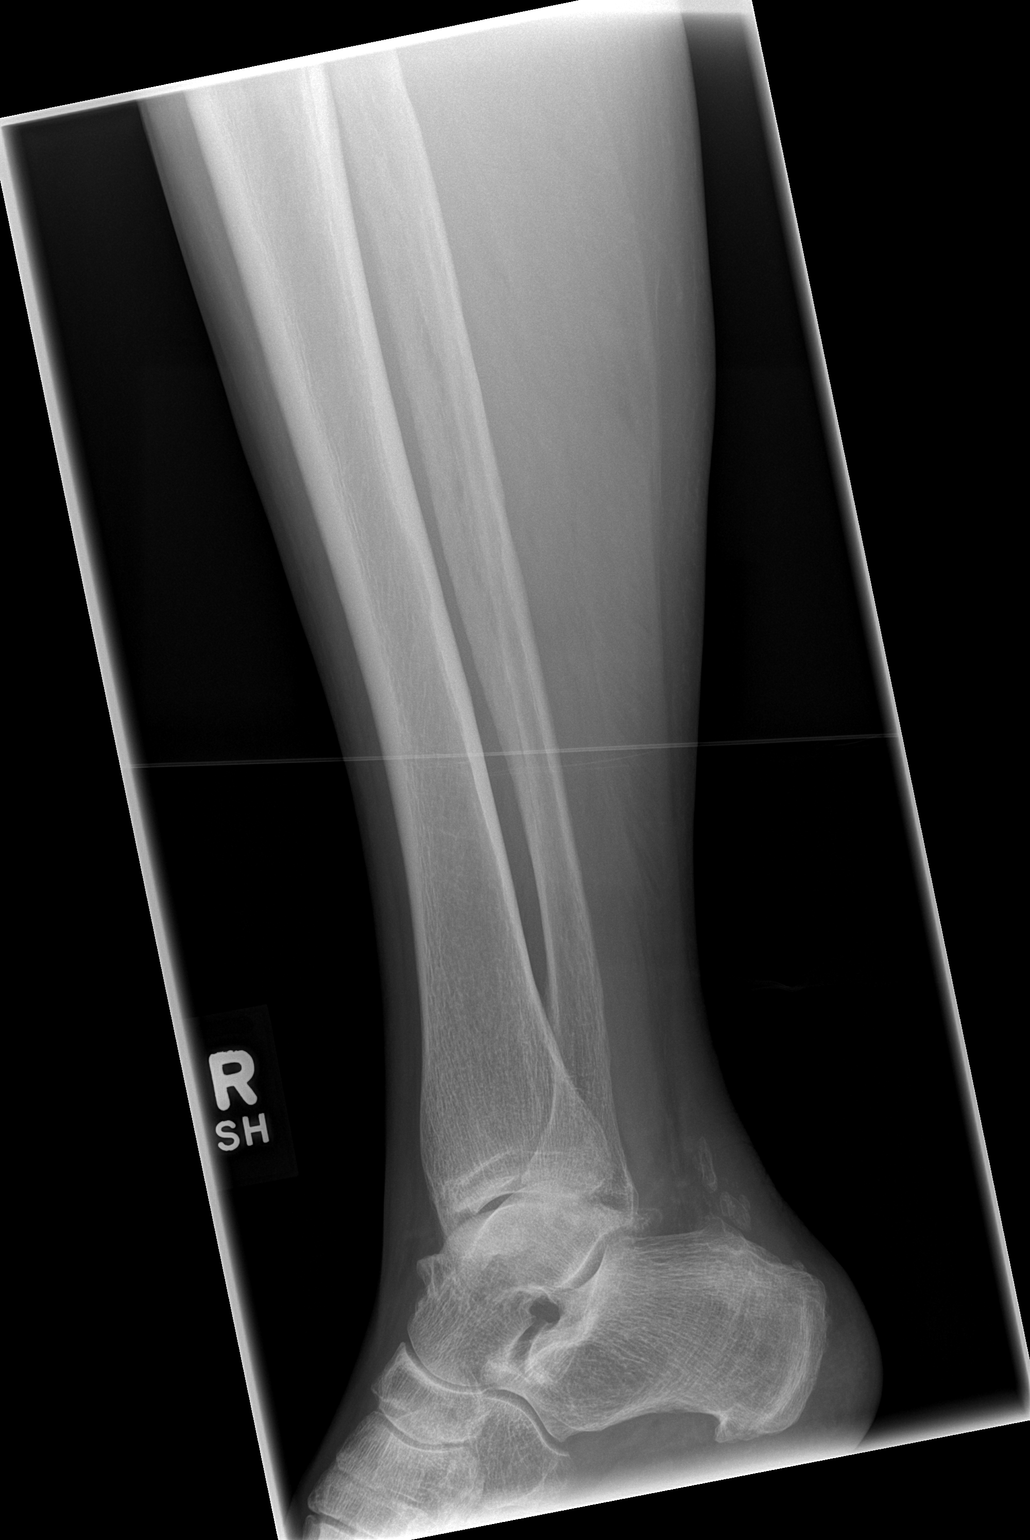

[t tib/fib lat right (2 of 2)]
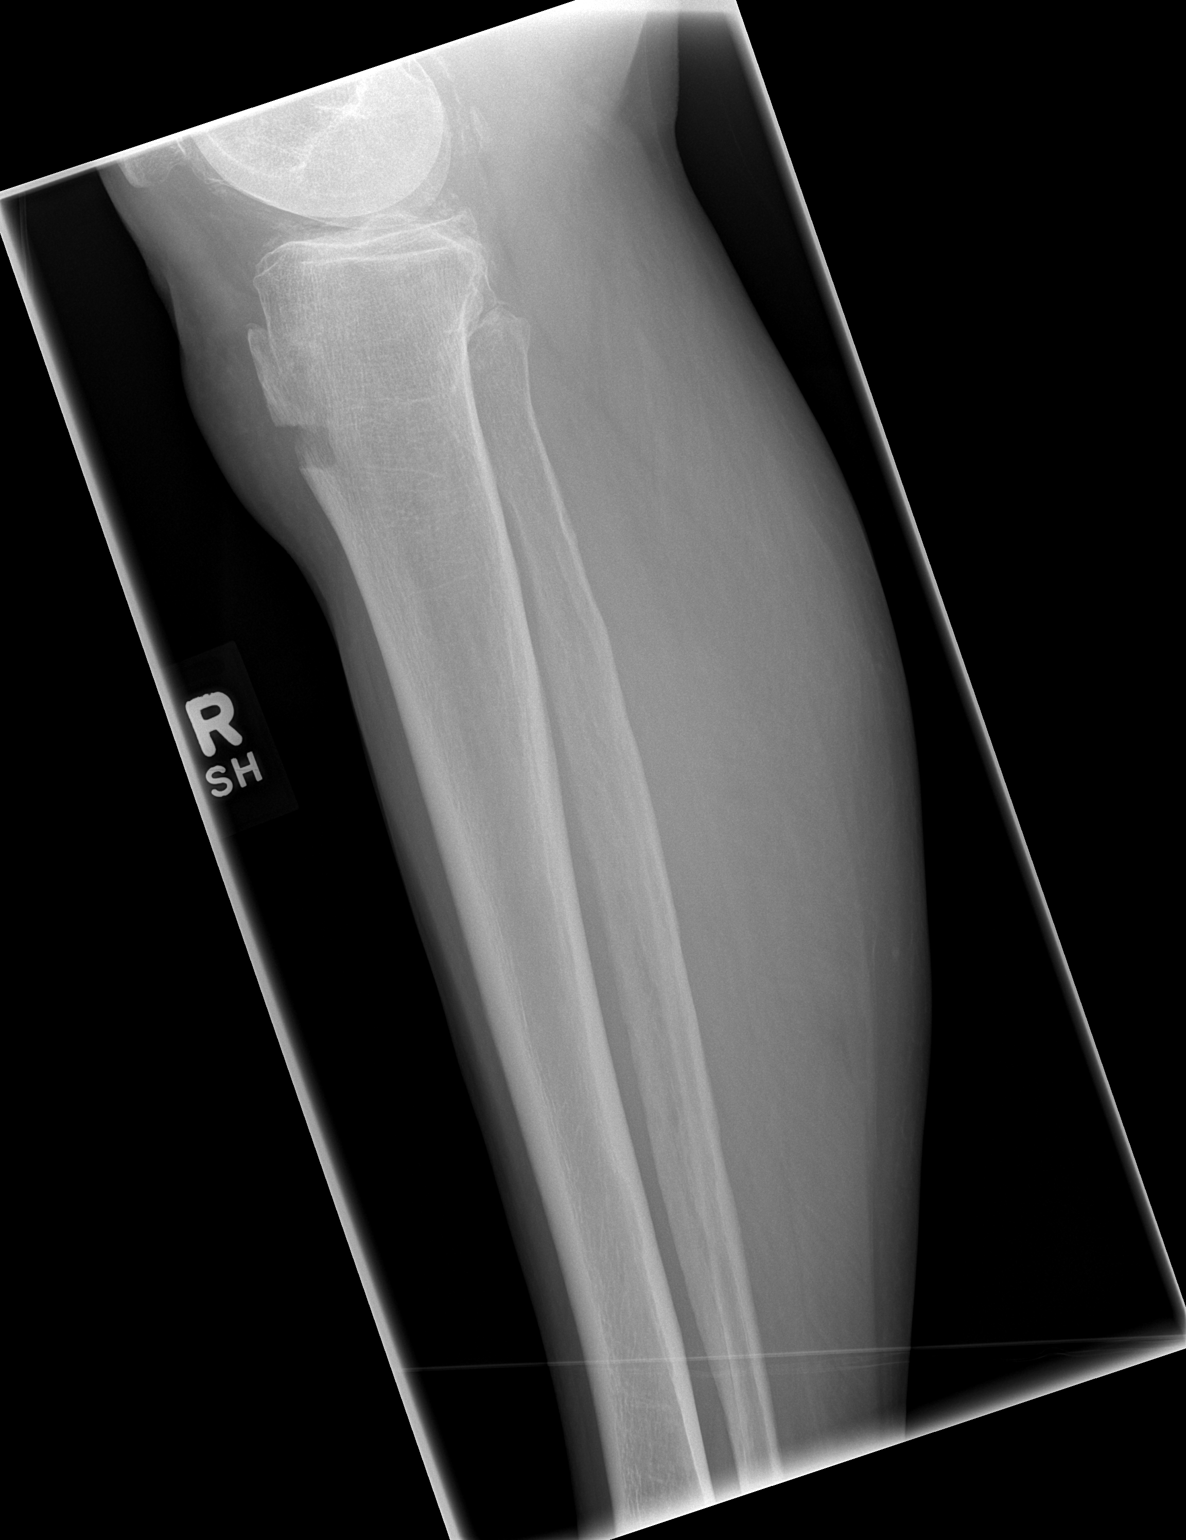

[4 of 4 positions shown; findings below may reference images not displayed]

FINDINGS: There is a well-defined defect in the anterior proximal tibia just
distal to the tibial tuberosity which has an appearance more
suggestive of a postsurgical osteotomy defect rather than an
avulsion fracture. There is swelling involving the anterior soft
tissues at this level. There is no other evidence of fracture.
Medial and lateral compartment chondrocalcinosis is noted. The knee
and ankle are located. Calcification is noted at the distal aspect
of the Achilles tendon. There is a small plantar calcaneal
enthesophyte.
IMPRESSION: 1. No definite evidence of acute osseous abnormality. Defect in the
proximal tibia is likely postsurgical rather than reflecting an
acute fracture.
2. Soft tissue swelling anterior to the proximal tibia.

## 2017-08-22 MED FILL — CITALOPRAM HBR 20 MG TABLET: 20 | 90 days supply | Qty: 90 | Fill #1

## 2017-08-22 MED FILL — OLMESARTAN MEDOXOMIL 5 MG T: 5 | 90 days supply | Qty: 90 | Fill #3

## 2017-08-22 MED FILL — ESTRADIOL 0.1 MG PATCH: 0.1 | 84 days supply | Qty: 24 | Fill #1

## 2017-08-22 MED FILL — METHOCARBAMOL 500 MG TABS: 500 | 20 days supply | Qty: 60 | Fill #1

## 2017-08-22 MED FILL — FUROSEMIDE 20 MG TABS: 20 | 90 days supply | Qty: 90 | Fill #1

## 2017-08-22 MED FILL — ROSUVASTATIN CALCIUM 20 MG: 20 | 90 days supply | Qty: 90 | Fill #1

## 2017-09-14 ENCOUNTER — Ambulatory Visit: Payer: 59 | Admitting: Podiatry

## 2017-09-14 ENCOUNTER — Encounter: Payer: Self-pay | Admitting: Podiatry

## 2017-09-14 DIAGNOSIS — B351 Tinea unguium: Secondary | ICD-10-CM | POA: Diagnosis not present

## 2017-09-14 DIAGNOSIS — E0842 Diabetes mellitus due to underlying condition with diabetic polyneuropathy: Secondary | ICD-10-CM | POA: Diagnosis not present

## 2017-09-14 NOTE — Progress Notes (Signed)
Subjective: 62 y.o. year old female patient presents complaining of discolored loose nail and shooting pain at night. Diabetic and the last A1c was 6.1.  Objective: Dermatologic: Thick yellow deformed nails x 10. Discolored loose distal 1/2 with debris under nail plate right great toe. Vascular: Pedal pulses are all palpable. Orthopedic: Contracted 2nd toe right. Neurologic: All epicritic and tactile sensations grossly intact.  Assessment: Dystrophic mycotic nails x 10. Trauma right great toe nail. Diabetic neuropathy.  Treatment: All mycotic nails debrided.  Patient wishes to try Neuremedy. Dispensed x 2. Return in 3 months or as needed.

## 2017-09-14 NOTE — Patient Instructions (Signed)
Seen for hypertrophic nails. All nails debrided. Return in 3 months or as needed.  

## 2017-09-16 ENCOUNTER — Encounter: Payer: Self-pay | Admitting: Family Medicine

## 2017-09-20 ENCOUNTER — Encounter: Payer: Self-pay | Admitting: Family Medicine

## 2017-09-20 NOTE — Telephone Encounter (Signed)
I was under the impression that Dr Vertell Limber was getting the MRI?

## 2017-09-21 DIAGNOSIS — N959 Unspecified menopausal and perimenopausal disorder: Secondary | ICD-10-CM | POA: Diagnosis not present

## 2017-09-21 DIAGNOSIS — Z6828 Body mass index (BMI) 28.0-28.9, adult: Secondary | ICD-10-CM | POA: Diagnosis not present

## 2017-09-21 DIAGNOSIS — Z01411 Encounter for gynecological examination (general) (routine) with abnormal findings: Secondary | ICD-10-CM | POA: Diagnosis not present

## 2017-09-21 DIAGNOSIS — N9419 Other specified dyspareunia: Secondary | ICD-10-CM | POA: Diagnosis not present

## 2017-09-23 ENCOUNTER — Encounter: Payer: Self-pay | Admitting: Family Medicine

## 2017-09-23 ENCOUNTER — Other Ambulatory Visit: Payer: Self-pay | Admitting: Family Medicine

## 2017-09-23 DIAGNOSIS — M5412 Radiculopathy, cervical region: Secondary | ICD-10-CM

## 2017-09-23 DIAGNOSIS — M542 Cervicalgia: Secondary | ICD-10-CM

## 2017-09-27 ENCOUNTER — Ambulatory Visit: Payer: Self-pay | Admitting: Family Medicine

## 2017-09-27 ENCOUNTER — Ambulatory Visit (HOSPITAL_COMMUNITY)
Admission: RE | Admit: 2017-09-27 | Discharge: 2017-09-27 | Disposition: A | Discharge status: Home / Self Care | Payer: 59 | Source: Ambulatory Visit | Attending: Family Medicine | Admitting: Family Medicine

## 2017-09-27 DIAGNOSIS — Z981 Arthrodesis status: Secondary | ICD-10-CM | POA: Insufficient documentation

## 2017-09-27 DIAGNOSIS — M5124 Other intervertebral disc displacement, thoracic region: Secondary | ICD-10-CM | POA: Diagnosis not present

## 2017-09-27 DIAGNOSIS — M4802 Spinal stenosis, cervical region: Secondary | ICD-10-CM | POA: Diagnosis not present

## 2017-09-27 DIAGNOSIS — M5412 Radiculopathy, cervical region: Secondary | ICD-10-CM

## 2017-09-27 DIAGNOSIS — M2578 Osteophyte, vertebrae: Secondary | ICD-10-CM | POA: Diagnosis not present

## 2017-09-27 DIAGNOSIS — M4804 Spinal stenosis, thoracic region: Secondary | ICD-10-CM | POA: Diagnosis not present

## 2017-09-27 DIAGNOSIS — M542 Cervicalgia: Secondary | ICD-10-CM

## 2017-09-28 ENCOUNTER — Ambulatory Visit (HOSPITAL_COMMUNITY): Admission: RE | Admit: 2017-09-28 | Payer: 59 | Source: Ambulatory Visit

## 2017-09-28 ENCOUNTER — Other Ambulatory Visit (HOSPITAL_COMMUNITY): Payer: 59

## 2017-09-28 ENCOUNTER — Encounter: Payer: Self-pay | Admitting: Family Medicine

## 2017-09-29 DIAGNOSIS — F509 Eating disorder, unspecified: Secondary | ICD-10-CM | POA: Diagnosis not present

## 2017-09-30 ENCOUNTER — Other Ambulatory Visit: Payer: Self-pay | Admitting: Family Medicine

## 2017-09-30 ENCOUNTER — Telehealth: Payer: Self-pay | Admitting: Family Medicine

## 2017-09-30 DIAGNOSIS — R937 Abnormal findings on diagnostic imaging of other parts of musculoskeletal system: Secondary | ICD-10-CM

## 2017-09-30 DIAGNOSIS — M5412 Radiculopathy, cervical region: Secondary | ICD-10-CM

## 2017-09-30 DIAGNOSIS — M4802 Spinal stenosis, cervical region: Secondary | ICD-10-CM

## 2017-09-30 NOTE — Telephone Encounter (Signed)
Left message for pt to return call to office for results.

## 2017-09-30 NOTE — Telephone Encounter (Signed)
Copied from [place] [phone]. Topic: Quick Communication - Lab Results >> Sep 30, 2017 11:32 AM Ewing, Donell Sievert, CMA wrote:  Called patient to inform them of 09/30/2017 lab results. When patient returns call, triage nurse may disclose results.

## 2017-10-04 ENCOUNTER — Encounter: Payer: Self-pay | Admitting: Family Medicine

## 2017-10-05 MED FILL — PANTOPRAZOLE SOD DR 40 MG T: 40 | 90 days supply | Qty: 90 | Fill #2

## 2017-10-06 ENCOUNTER — Ambulatory Visit: Payer: 59 | Admitting: Skilled Nursing Facility1

## 2017-10-06 ENCOUNTER — Encounter: Payer: Self-pay | Admitting: Family Medicine

## 2017-10-06 ENCOUNTER — Ambulatory Visit: Payer: 59 | Admitting: Family Medicine

## 2017-10-06 VITALS — BP 126/76 | HR 63 | Temp 97.8°F | Resp 16 | Ht 71.0 in | Wt 196.0 lb

## 2017-10-06 DIAGNOSIS — E1165 Type 2 diabetes mellitus with hyperglycemia: Secondary | ICD-10-CM

## 2017-10-06 DIAGNOSIS — IMO0002 Reserved for concepts with insufficient information to code with codable children: Secondary | ICD-10-CM

## 2017-10-06 DIAGNOSIS — I10 Essential (primary) hypertension: Secondary | ICD-10-CM | POA: Diagnosis not present

## 2017-10-06 DIAGNOSIS — E1151 Type 2 diabetes mellitus with diabetic peripheral angiopathy without gangrene: Secondary | ICD-10-CM

## 2017-10-06 DIAGNOSIS — M5412 Radiculopathy, cervical region: Secondary | ICD-10-CM

## 2017-10-06 DIAGNOSIS — E785 Hyperlipidemia, unspecified: Secondary | ICD-10-CM | POA: Insufficient documentation

## 2017-10-06 LAB — COMPREHENSIVE METABOLIC PANEL
ALT: 14 U/L (ref 0–35)
AST: 14 U/L (ref 0–37)
Albumin: 4.1 g/dL (ref 3.5–5.2)
Alkaline Phosphatase: 67 U/L (ref 39–117)
BUN: 11 mg/dL (ref 6–23)
CO2: 33 mEq/L — ABNORMAL HIGH (ref 19–32)
Calcium: 9.6 mg/dL (ref 8.4–10.5)
Chloride: 100 mEq/L (ref 96–112)
Creatinine, Ser: 0.72 mg/dL (ref 0.40–1.20)
GFR: 87.6 mL/min (ref >60.00)
Glucose, Bld: 122 mg/dL — ABNORMAL HIGH (ref 70–99)
Potassium: 4.1 mEq/L (ref 3.5–5.1)
Sodium: 139 mEq/L (ref 135–145)
Total Bilirubin: 0.6 mg/dL (ref 0.2–1.2)
Total Protein: 7.8 g/dL (ref 6.0–8.3)

## 2017-10-06 LAB — LIPID PANEL
Cholesterol: 162 mg/dL (ref 0–200)
HDL: 59.5 mg/dL (ref >39.00)
LDL Cholesterol: 84 mg/dL (ref 0–99)
NonHDL: 102.49
Total CHOL/HDL Ratio: 3
Triglycerides: 91 mg/dL (ref 0.0–149.0)
VLDL: 18.2 mg/dL (ref 0.0–40.0)

## 2017-10-06 LAB — HEMOGLOBIN A1C: Hgb A1c MFr Bld: 5.8 % (ref 4.6–6.5)

## 2017-10-06 MED ORDER — METHOCARBAMOL 500 MG PO TABS: 500.0000 mg | ORAL_TABLET | Freq: Four times a day (QID) | ORAL | 1 refills | Status: DC

## 2017-10-06 MED ORDER — HYDROCODONE-ACETAMINOPHEN 5-325 MG PO TABS: 1.0000 | ORAL_TABLET | Freq: Four times a day (QID) | ORAL | 0 refills | Status: DC | PRN

## 2017-10-06 MED ORDER — CYCLOBENZAPRINE HCL 10 MG PO TABS: 10.0000 mg | ORAL_TABLET | Freq: Three times a day (TID) | ORAL | 0 refills | Status: DC | PRN

## 2017-10-06 MED ORDER — KETOROLAC TROMETHAMINE 60 MG/2ML IM SOLN
60.0000 mg | Freq: Once | INTRAMUSCULAR | Status: AC
  Administered 2017-10-06: 60 mg via INTRAMUSCULAR

## 2017-10-06 MED ORDER — HYDROCODONE-ACETAMINOPHEN 5-325 MG PO TABS: 1.0000 | ORAL_TABLET | Freq: Four times a day (QID) | ORAL | Status: DC | PRN

## 2017-10-06 MED FILL — CYCLOBENZAPRINE HCL 10 MG T: 10 | 10 days supply | Qty: 30 | Fill #0

## 2017-10-06 MED FILL — METHOCARBAMOL 500 MG TABS: 500 | 3 days supply | Qty: 10 | Fill #0

## 2017-10-06 MED FILL — HYDROCODON-APAP 5-325: 5-325 | 7 days supply | Qty: 30 | Fill #0

## 2017-10-06 NOTE — Assessment & Plan Note (Addendum)
hgba1c acceptable, minimize simple carbs. Increase exercise as tolerated. Continue current meds 

## 2017-10-06 NOTE — Assessment & Plan Note (Signed)
Encouraged heart healthy diet, increase exercise, avoid trans fats, consider a krill oil cap daily 

## 2017-10-06 NOTE — Patient Instructions (Signed)
Cervical Radiculopathy  Cervical radiculopathy means that a nerve in the neck is pinched or bruised. This can cause pain or loss of feeling (numbness) that runs from your neck to your arm and fingers.  Follow these instructions at home:  Managing pain  ? Take over-the-counter and prescription medicines only as told by your doctor.  ? If directed, put ice on the injured or painful area.  ? Put ice in a plastic bag.  ? Place a towel between your skin and the bag.  ? Leave the ice on for 20 minutes, 2?3 times per day.  ? If ice does not help, you can try using heat. Take a warm shower or warm bath, or use a heat pack as told by your doctor.  ? You may try a gentle neck and shoulder massage.  Activity  ? Rest as needed. Follow instructions from your doctor about any activities to avoid.  ? Do exercises as told by your doctor or physical therapist.  General instructions  ? If you were given a soft collar, wear it as told by your doctor.  ? Use a flat pillow when you sleep.  ? Keep all follow-up visits as told by your doctor. This is important.  Contact a doctor if:  ? Your condition does not improve with treatment.  Get help right away if:  ? Your pain gets worse and is not controlled with medicine.  ? You lose feeling or feel weak in your hand, arm, face, or leg.  ? You have a fever.  ? You have a stiff neck.  ? You cannot control when you poop or pee (have incontinence).  ? You have trouble with walking, balance, or talking.  This information is not intended to replace advice given to you by your health care provider. Make sure you discuss any questions you have with your health care provider.  Document Released: 06/17/2011 Document Revised: 12/04/2015 Document Reviewed: 08/22/2014  Elsevier Interactive Patient Education ? 2018 Elsevier Inc.

## 2017-10-06 NOTE — Progress Notes (Signed)
Patient ID: [name], female   DOB: [birth year], 62 y.o.   MRN: [id number]    Subjective:  I acted as a Education administrator for Dr. Carollee Herter.  [name], [place]   Patient ID: [name], female    DOB: May 23, 1956, 62 y.o.   MRN: [id number]  Chief Complaint  Patient presents with  . Hypertension  . Hyperlipidemia  . Diabetes  . Neck Pain    HPI  Patient is in today for follow up blood pressure, cholesterol, and diabetes.  She is also having severe neck pain.  She has an appointment in May with Dr. Vertell Limber.  Since her MRI she has gotten worse-- esp over the last few days.  She is unable to turn her head without significant pain  Patient Care Team: Carollee Herter, Alferd Apa, DO as PCP - General (Family Medicine) Himmelrich, Bryson Ha, RD (Inactive) as Dietitian   Past Medical History:  Diagnosis Date  . Allergic rhinitis   . Anxiety   . Asthma    hx bronchial asthma at times with upper resp infection  . Depression   . Diabetes mellitus without complication (HCC)    diet controlled no meds  . GERD (gastroesophageal reflux disease)   . Headache(784.0)    migraine and cluster headaches  . Hyperlipemia   . Hypertension   . IBS (irritable bowel syndrome)   . Menopause   . Neuromuscular disorder ([place]) 2009   hx of fibromyalgia, polyarthralsia (surgery induced)  . Stress incontinence    at times  . Tibial fracture 10/14/2016   evulsion periostial right    Past Surgical History:  Procedure Laterality Date  . ABDOMINAL HYSTERECTOMY  12/1999   complete  . CERVICAL CONIZATION W/BX  june 1990   dysplasia of cervix/used 5Fu cream for 3 months  . CERVICAL LAMINECTOMY  2005 & 2009   X 2   NO ROM PROBLEMS  . [place]  . DILATION AND CURETTAGE OF UTERUS   [phone]   missed abortion  . ESOPHAGOGASTRODUODENOSCOPY  06/29/2011   Procedure: ESOPHAGOGASTRODUODENOSCOPY (EGD);  Surgeon: Shann Medal, MD;  Location: [facility];  Service:  General;  Laterality: N/A;  . FOOT SURGERY  2005   RT HEEL  . GASTRIC BANDING PORT REVISION  09/24/2011   Procedure: GASTRIC BANDING PORT REVISION;  Surgeon: Pedro Earls, MD;  Location: [facility];  Service: General;  Laterality: N/A;  . GASTRIC ROUX-EN-Y N/A 06/06/2017   Procedure: Conversion from Sleeve to [place];  Surgeon: Johnathan Hausen, MD;  Location: [facility];  Service: General;  Laterality: N/A;  . HYSTEROSCOPY  1999  . LAPAROSCOPIC GASTRIC BANDING  12/30/09  . LAPAROSCOPIC GASTRIC SLEEVE RESECTION N/A 07/02/2013   Procedure: LAPAROSCOPIC GASTRIC SLEEVE RESECTION upper endoscopy;  Surgeon: Pedro Earls, MD;  Location: [facility];  Service: General;  Laterality: N/A;  . LAPAROSCOPIC REPAIR AND REMOVAL OF GASTRIC BAND N/A 07/02/2013   Procedure: LAPAROSCOPIC REMOVAL OF GASTRIC BAND;  Surgeon: Pedro Earls, MD;  Location: [facility];  Service: General;  Laterality: N/A;  . LAPAROSCOPIC REVISION OF GASTRIC BAND  07/03/2012   Procedure: LAPAROSCOPIC REVISION OF GASTRIC BAND;  Surgeon: Pedro Earls, MD;  Location: [facility];  Service: General;  Laterality: N/A;  removal of old lap. band port, replaced with AP standard band  . LAPAROSCOPY  09/24/2011   Procedure: LAPAROSCOPY DIAGNOSTIC;  Surgeon: Pedro Earls,  MD;  Location: [facility];  Service: General;  Laterality: N/A;  . LAPAROSCOPY  07/03/2012   Procedure: LAPAROSCOPY DIAGNOSTIC;  Surgeon: Pedro Earls, MD;  Location: [facility];  Service: General;  Laterality: N/A;  Exploratory Laparoscopy   . MESH APPLIED TO LAP PORT  07/03/2012   Procedure: MESH APPLIED TO LAP PORT;  Surgeon: Pedro Earls, MD;  Location: [facility];  Service: General;  Laterality: N/A;  . [place]   X 2  . right knee  1981   ARTHROSCOPY AND ARTHROTOMY  . right knee arthroscopy and arthrotomy  12-1979  . TONSILLECTOMY  1971  . TUBAL LIGATION  1993   WITH C -SECTION    Family History    Problem Relation Age of Onset  . Diabetes Mother   . Stroke Mother   . Breast cancer Mother   . Atrial fibrillation Mother   . Hypertension Mother   . Cancer Mother        breast  . Diabetes Father   . Stroke Father   . Hypertension Father   . Heart attack Father   . Thyroid disease Unknown   . Heart disease Unknown   . COPD Other     Social History   Socioeconomic History  . Marital status: Married    Spouse name: Not on file  . Number of children: Not on file  . Years of education: Not on file  . Highest education level: Not on file  Occupational History  . Occupation: Programmer, multimedia: [employer]  Social Needs  . Financial resource strain: Not on file  . Food insecurity:    Worry: Not on file    Inability: Not on file  . Transportation needs:    Medical: Not on file    Non-medical: Not on file  Tobacco Use  . Smoking status: Never Smoker  . Smokeless tobacco: Never Used  Substance and Sexual Activity  . Alcohol use: No    Alcohol/week: 0.0 oz  . Drug use: No  . Sexual activity: Yes    Birth control/protection: None  Lifestyle  . Physical activity:    Days per week: Not on file    Minutes per session: Not on file  . Stress: Not on file  Relationships  . Social connections:    Talks on phone: Not on file    Gets together: Not on file    Attends religious service: Not on file    Active member of club or organization: Not on file    Attends meetings of clubs or organizations: Not on file    Relationship status: Not on file  . Intimate partner violence:    Fear of current or ex partner: Not on file    Emotionally abused: Not on file    Physically abused: Not on file    Forced sexual activity: Not on file  Other Topics Concern  . Not on file  Social History Narrative  . Not on file    Outpatient Medications Prior to Visit  Medication Sig Dispense Refill  . Blood Glucose Monitoring Suppl (BAYER CONTOUR LINK MONITOR) W/DEVICE KIT Test  blood sugar once daily DX code: E11.9 1 kit 1  . Ca Phosphate-Cholecalciferol (CALTRATE GUMMY BITES PO) Take 2 each 2 (two) times daily by mouth.     . citalopram (CELEXA) 20 MG tablet TAKE 1 TABLET (20 MG TOTAL) BY MOUTH EVERY EVENING. 90 tablet 3  .  cyclobenzaprine (FLEXERIL) 10 MG tablet TAKE 1 TABLET (10 MG TOTAL) BY MOUTH 3 TIMES DAILY AS NEEDED FOR MUSCLE SPASMS. 30 tablet 0  . estradiol (VIVELLE-DOT) 0.1 MG/24HR Place 1 patch (0.1 mg total) onto the skin 2 (two) times a week. Monday and Thursday 8 patch 11  . furosemide (LASIX) 20 MG tablet TAKE 1 TABLET (20 MG TOTAL) BY MOUTH EVERY MORNING. 90 tablet 1  . glucose blood (ONETOUCH VERIO) test strip Check blood sugar daily 100 each 12  . glucose blood test strip Test blood sugar once daily. Dx Code: E11.9 100 each 12  . loratadine (CLARITIN) 10 MG tablet Take 10 mg by mouth every evening.     . metoCLOPramide (REGLAN) 10 MG tablet Take 10 mg by mouth at bedtime.    Marland Kitchen olmesartan (BENICAR) 5 MG tablet Take 1 tablet (5 mg total) by mouth daily. 90 tablet 3  . ONETOUCH DELICA LANCETS FINE MISC check blood sugar daily 100 each 12  . pantoprazole (PROTONIX) 40 MG tablet Take 1 tablet (40 mg total) by mouth daily. (Patient taking differently: Take 40 mg at bedtime by mouth. ) 90 tablet 3  . Pediatric Multivit-Minerals-C (ONE-A-DAY SCOOBY-DOO GUMMIES PO) Take 1 tablet 2 (two) times daily by mouth.     . potassium chloride (K-DUR,KLOR-CON) 10 MEQ tablet TAKE 1 TABLET (10 MEQ TOTAL) BY MOUTH DAILY. 90 tablet 1  . PREMARIN vaginal cream Apply 1 application topically 2 (two) times a week.  1  . ranitidine (ZANTAC) 150 MG tablet Take 1 tablet (150 mg total) by mouth 2 (two) times daily. 180 tablet 3  . rosuvastatin (CRESTOR) 20 MG tablet TAKE 1 TABLET (20 MG TOTAL) BY MOUTH AT BEDTIME. 90 tablet 1  . sucralfate (CARAFATE) 1 GM/10ML suspension Take 1 g at bedtime by mouth.    . thiamine (VITAMIN B-1) 100 MG tablet Take 100 mg daily by mouth.     No  facility-administered medications prior to visit.     Allergies  Allergen Reactions  . Isoniazid     Severe SOB  . Nitrofurantoin Shortness Of Breath    REACTION: whelps  . Hctz [Hydrochlorothiazide]     rash  . Promethazine Hcl [Promethazine Hcl]     IF GIVEN  IV-hallucinations     CAN TAKE PO PHENERGAN  . Tamiflu [Oseltamivir Phosphate] Nausea And Vomiting    "I vomited within 30 minutes of taking it and was told I cannot ever take it again."  . Macrolides And Ketolides     rash  . Morphine     REACTION: severe vomiting  . Percocet [Oxycodone-Acetaminophen]     REACTION: severe itching. Can take by mouth codeine  . Roxicet [Oxycodone-Acetaminophen]     itching  . Sulfamethoxazole-Trimethoprim     Septra / Bactrim  --REACTION: rash  . Fentanyl Hives and Rash    REACTION: whelps    Review of Systems  Constitutional: Negative for fever and malaise/fatigue.  HENT: Negative for congestion.   Eyes: Negative for blurred vision.  Respiratory: Negative for cough and shortness of breath.   Cardiovascular: Negative for chest pain, palpitations and leg swelling.  Gastrointestinal: Negative for vomiting.  Musculoskeletal: Positive for neck pain. Negative for back pain.  Skin: Negative for rash.  Neurological: Negative for loss of consciousness and headaches.       Objective:    Physical Exam  Constitutional: She is oriented to person, place, and time. She appears well-developed and well-nourished.  HENT:  Head: Normocephalic and  atraumatic.  Eyes: Conjunctivae and EOM are normal.  Neck: Normal range of motion. Neck supple. No JVD present. Carotid bruit is not present. No thyromegaly present.  Cardiovascular: Normal rate, regular rhythm and normal heart sounds.  No murmur heard. Pulmonary/Chest: Effort normal and breath sounds normal. No respiratory distress. She has no wheezes. She has no rales. She exhibits no tenderness.  Musculoskeletal: She exhibits tenderness. She  exhibits no edema.  Neurological: She is alert and oriented to person, place, and time.  Strength --  Upper ext R--3/5  L 4/5 Numbness and tingling both upper ext R >L  Unable to turn head without pain    Psychiatric: She has a normal mood and affect. Her behavior is normal. Judgment and thought content normal.  Nursing note and vitals reviewed.   BP 126/76   Pulse 63   Temp 97.8 F (36.6 C) (Oral)   Resp 16   Ht 5' 11" (1.803 m)   Wt 196 lb (88.9 kg)   SpO2 98%   BMI 27.34 kg/m  Wt Readings from Last 3 Encounters:  10/06/17 196 lb (88.9 kg)  08/01/17 198 lb 9.6 oz (90.1 kg)  06/22/17 200 lb 3.2 oz (90.8 kg)   BP Readings from Last 3 Encounters:  10/06/17 126/76  06/07/17 (!) 145/67  05/30/17 129/69     Immunization History  Administered Date(s) Administered  . H1N1 07/23/2008  . Influenza Whole 04/24/2008  . Influenza,inj,Quad PF,6+ Mos 04/11/2013  . Influenza-Unspecified 04/25/2014, 03/13/2015, 03/12/2016  . Pneumococcal Polysaccharide-23 07/04/2013  . Td 01/02/2009  . Zoster Recombinat (Shingrix) 03/21/2017, 05/25/2017    Health Maintenance  Topic Date Due  . COLONOSCOPY  01/04/2017  . HEMOGLOBIN A1C  11/28/2017  . FOOT EXAM  03/07/2018  . OPHTHALMOLOGY EXAM  03/09/2018  . MAMMOGRAM  06/16/2018  . PNEUMOCOCCAL POLYSACCHARIDE VACCINE (2) 07/04/2018  . TETANUS/TDAP  01/03/2019  . PAP SMEAR  09/07/2019  . INFLUENZA VACCINE  Completed  . Hepatitis C Screening  Completed  . HIV Screening  Completed    Lab Results  Component Value Date   WBC 15.4 (H) 06/07/2017   HGB 11.8 (L) 06/07/2017   HCT 36.1 06/07/2017   PLT 206 06/07/2017   GLUCOSE 122 (H) 10/06/2017   CHOL 162 10/06/2017   TRIG 91.0 10/06/2017   HDL 59.50 10/06/2017   LDLCALC 84 10/06/2017   ALT 14 10/06/2017   AST 14 10/06/2017   NA 139 10/06/2017   K 4.1 10/06/2017   CL 100 10/06/2017   CREATININE 0.72 10/06/2017   BUN 11 10/06/2017   CO2 33 (H) 10/06/2017   TSH 1.87 12/24/2013    HGBA1C 5.8 10/06/2017   MICROALBUR 0.1 08/01/2014    Lab Results  Component Value Date   TSH 1.87 12/24/2013   Lab Results  Component Value Date   WBC 15.4 (H) 06/07/2017   HGB 11.8 (L) 06/07/2017   HCT 36.1 06/07/2017   MCV 86.8 06/07/2017   PLT 206 06/07/2017   Lab Results  Component Value Date   NA 139 10/06/2017   K 4.1 10/06/2017   CO2 33 (H) 10/06/2017   GLUCOSE 122 (H) 10/06/2017   BUN 11 10/06/2017   CREATININE 0.72 10/06/2017   BILITOT 0.6 10/06/2017   ALKPHOS 67 10/06/2017   AST 14 10/06/2017   ALT 14 10/06/2017   PROT 7.8 10/06/2017   ALBUMIN 4.1 10/06/2017   CALCIUM 9.6 10/06/2017   ANIONGAP 9 05/31/2017   GFR 87.60 10/06/2017   Lab Results  Component Value Date   CHOL 162 10/06/2017   Lab Results  Component Value Date   HDL 59.50 10/06/2017   Lab Results  Component Value Date   LDLCALC 84 10/06/2017   Lab Results  Component Value Date   TRIG 91.0 10/06/2017   Lab Results  Component Value Date   CHOLHDL 3 10/06/2017   Lab Results  Component Value Date   HGBA1C 5.8 10/06/2017         Assessment & Plan:   Problem List Items Addressed This Visit      Unprioritized   Cervical radiculopathy    Original appointment with neurosurgery was not until May---  Symptoms have worsened since MRI Called NS---  They reviewed MRI and will see her Monday      Relevant Medications   methocarbamol (ROBAXIN) 500 MG tablet   cyclobenzaprine (FLEXERIL) 10 MG tablet   HYDROcodone-acetaminophen (NORCO/VICODIN) 5-325 MG tablet   ketorolac (TORADOL) injection 60 mg (Completed)   DM (diabetes mellitus) type II uncontrolled, periph vascular disorder (HCC)    hgba1c acceptable, minimize simple carbs. Increase exercise as tolerated. Continue current meds      Relevant Orders   Hemoglobin A1c (Completed)   Essential hypertension    Well controlled, no changes to meds. Encouraged heart healthy diet such as the DASH diet and exercise as tolerated.        Relevant Orders   Comprehensive metabolic panel (Completed)   Hyperlipidemia - Primary    Encouraged heart healthy diet, increase exercise, avoid trans fats, consider a krill oil cap daily      Relevant Orders   Lipid panel (Completed)      I am having Charmaine Downs. Raneri start on methocarbamol, cyclobenzaprine, and HYDROcodone-acetaminophen. I am also having her maintain her loratadine, Ca Phosphate-Cholecalciferol (CALTRATE GUMMY BITES PO), Pediatric Multivit-Minerals-C (ONE-A-DAY SCOOBY-DOO GUMMIES PO), estradiol, ONETOUCH DELICA LANCETS FINE, BAYER CONTOUR LINK MONITOR, glucose blood, PREMARIN, glucose blood, cyclobenzaprine, metoCLOPramide, furosemide, olmesartan, pantoprazole, potassium chloride, rosuvastatin, ranitidine, citalopram, sucralfate, and thiamine. We administered ketorolac.  Meds ordered this encounter  Medications  . methocarbamol (ROBAXIN) 500 MG tablet    Sig: Take 1 tablet (500 mg total) by mouth 4 (four) times daily.    Dispense:  10 tablet    Refill:  1  . cyclobenzaprine (FLEXERIL) 10 MG tablet    Sig: Take 1 tablet (10 mg total) by mouth 3 (three) times daily as needed for muscle spasms.    Dispense:  30 tablet    Refill:  0  . DISCONTD: HYDROcodone-acetaminophen (NORCO/VICODIN) 5-325 MG per tablet 1 tablet  . HYDROcodone-acetaminophen (NORCO/VICODIN) 5-325 MG tablet    Sig: Take 1 tablet by mouth every 6 (six) hours as needed for moderate pain.    Dispense:  30 tablet    Refill:  0  . ketorolac (TORADOL) injection 60 mg    CMA served as scribe during this visit. History, Physical and Plan performed by medical provider. Documentation and orders reviewed and attested to.  [name], DO

## 2017-10-06 NOTE — Assessment & Plan Note (Signed)
Original appointment with neurosurgery was not until May---  Symptoms have worsened since MRI Called NS---  They reviewed MRI and will see her Monday

## 2017-10-06 NOTE — Assessment & Plan Note (Signed)
Well controlled, no changes to meds. Encouraged heart healthy diet such as the DASH diet and exercise as tolerated.  °

## 2017-10-07 ENCOUNTER — Ambulatory Visit (INDEPENDENT_AMBULATORY_CARE_PROVIDER_SITE_OTHER): Payer: 59 | Admitting: Family Medicine

## 2017-10-07 DIAGNOSIS — M542 Cervicalgia: Secondary | ICD-10-CM | POA: Diagnosis not present

## 2017-10-07 MED ORDER — KETOROLAC TROMETHAMINE 60 MG/2ML IM SOLN
60.0000 mg | Freq: Once | INTRAMUSCULAR | Status: AC
  Administered 2017-10-07: 60 mg via INTRAMUSCULAR

## 2017-10-07 NOTE — Progress Notes (Signed)
Patient in for toradol for neck pain.

## 2017-10-10 DIAGNOSIS — M5412 Radiculopathy, cervical region: Secondary | ICD-10-CM | POA: Diagnosis not present

## 2017-10-10 DIAGNOSIS — Z6828 Body mass index (BMI) 28.0-28.9, adult: Secondary | ICD-10-CM | POA: Diagnosis not present

## 2017-10-10 DIAGNOSIS — I10 Essential (primary) hypertension: Secondary | ICD-10-CM | POA: Diagnosis not present

## 2017-10-10 DIAGNOSIS — M47812 Spondylosis without myelopathy or radiculopathy, cervical region: Secondary | ICD-10-CM | POA: Diagnosis not present

## 2017-10-10 DIAGNOSIS — M542 Cervicalgia: Secondary | ICD-10-CM | POA: Diagnosis not present

## 2017-10-18 ENCOUNTER — Encounter: Payer: Self-pay | Admitting: Family Medicine

## 2017-10-18 ENCOUNTER — Ambulatory Visit: Payer: 59 | Admitting: Family Medicine

## 2017-10-18 VITALS — BP 126/70 | HR 72 | Temp 98.3°F | Resp 16 | Ht 71.0 in | Wt 199.6 lb

## 2017-10-18 DIAGNOSIS — G4700 Insomnia, unspecified: Secondary | ICD-10-CM

## 2017-10-18 DIAGNOSIS — R200 Anesthesia of skin: Secondary | ICD-10-CM

## 2017-10-18 MED ORDER — TRAZODONE HCL 50 MG PO TABS: 25.0000 mg | ORAL_TABLET | Freq: Every evening | ORAL | 3 refills | Status: DC | PRN

## 2017-10-18 NOTE — Patient Instructions (Signed)
Carpal Tunnel Syndrome Carpal tunnel syndrome is a condition that causes pain in your hand and arm. The carpal tunnel is a narrow area that is on the palm side of your wrist. Repeated wrist motion or certain diseases may cause swelling in the tunnel. This swelling can pinch the main nerve in the wrist (median nerve). Follow these instructions at home: If you have a splint:  Wear it as told by your doctor. Remove it only as told by your doctor.  Loosen the splint if your fingers: ? Become numb and tingle. ? Turn blue and cold.  Keep the splint clean and dry. General instructions  Take over-the-counter and prescription medicines only as told by your doctor.  Rest your wrist from any activity that may be causing your pain. If needed, talk to your employer about changes that can be made in your work, such as getting a wrist pad to use while typing.  If directed, apply ice to the painful area: ? Put ice in a plastic bag. ? Place a towel between your skin and the bag. ? Leave the ice on for 20 minutes, 2-3 times per day.  Keep all follow-up visits as told by your doctor. This is important.  Do any exercises as told by your doctor, physical therapist, or occupational therapist. Contact a doctor if:  You have new symptoms.  Medicine does not help your pain.  Your symptoms get worse. This information is not intended to replace advice given to you by your health care provider. Make sure you discuss any questions you have with your health care provider. Document Released: 06/17/2011 Document Revised: 12/04/2015 Document Reviewed: 11/13/2014 Elsevier Interactive Patient Education  2018 Elsevier Inc.  

## 2017-10-18 NOTE — Progress Notes (Signed)
Patient ID: [name], female   DOB: [birth year], 62 y.o.   MRN: [id number]     Subjective:  I acted as a Education administrator for Dr. Carollee Skinner.  [name], [place]   Patient ID: [name], female    DOB: 1955/07/31, 62 y.o.   MRN: [id number]  Chief Complaint  Patient presents with  . Hand Pain    right     HPI  Patient is in today for right hand pain.  She is having tingling and burning in fingers.  Ns told her it was not related to her neck.  She   Patient Care Team: [name], Alferd Apa, DO as PCP - General (Family Medicine) Himmelrich, Bryson Ha, RD (Inactive) as Dietitian   Past Medical History:  Diagnosis Date  . Allergic rhinitis   . Anxiety   . Asthma    hx bronchial asthma at times with upper resp infection  . Depression   . Diabetes mellitus without complication (HCC)    diet controlled no meds  . GERD (gastroesophageal reflux disease)   . Headache(784.0)    migraine and cluster headaches  . Hyperlipemia   . Hypertension   . IBS (irritable bowel syndrome)   . Menopause   . Neuromuscular disorder ([place]) 2009   hx of fibromyalgia, polyarthralsia (surgery induced)  . Stress incontinence    at times  . Tibial fracture 10/14/2016   evulsion periostial right    Past Surgical History:  Procedure Laterality Date  . ABDOMINAL HYSTERECTOMY  12/1999   complete  . CERVICAL CONIZATION W/BX  june 1990   dysplasia of cervix/used 5Fu cream for 3 months  . CERVICAL LAMINECTOMY  2005 & 2009   X 2   NO ROM PROBLEMS  . [place]  . DILATION AND CURETTAGE OF UTERUS   [phone]   missed abortion  . ESOPHAGOGASTRODUODENOSCOPY  06/29/2011   Procedure: ESOPHAGOGASTRODUODENOSCOPY (EGD);  Surgeon: Shann Medal, MD;  Location: [facility];  Service: General;  Laterality: N/A;  . FOOT SURGERY  2005   RT HEEL  . GASTRIC BANDING PORT REVISION  09/24/2011   Procedure: GASTRIC BANDING PORT REVISION;  Surgeon: Pedro Earls, MD;   Location: [facility];  Service: General;  Laterality: N/A;  . GASTRIC ROUX-EN-Y N/A 06/06/2017   Procedure: Conversion from [place] to [place];  Surgeon: Johnathan Hausen, MD;  Location: [facility];  Service: General;  Laterality: N/A;  . HYSTEROSCOPY  1999  . LAPAROSCOPIC GASTRIC BANDING  12/30/09  . LAPAROSCOPIC GASTRIC SLEEVE RESECTION N/A 07/02/2013   Procedure: LAPAROSCOPIC GASTRIC SLEEVE RESECTION upper endoscopy;  Surgeon: Pedro Earls, MD;  Location: [facility];  Service: General;  Laterality: N/A;  . LAPAROSCOPIC REPAIR AND REMOVAL OF GASTRIC BAND N/A 07/02/2013   Procedure: LAPAROSCOPIC REMOVAL OF GASTRIC BAND;  Surgeon: Pedro Earls, MD;  Location: [facility];  Service: General;  Laterality: N/A;  . LAPAROSCOPIC REVISION OF GASTRIC BAND  07/03/2012   Procedure: LAPAROSCOPIC REVISION OF GASTRIC BAND;  Surgeon: Pedro Earls, MD;  Location: [facility];  Service: General;  Laterality: N/A;  removal of old lap. band port, replaced with AP standard band  . LAPAROSCOPY  09/24/2011   Procedure: LAPAROSCOPY DIAGNOSTIC;  Surgeon: Pedro Earls, MD;  Location: [facility];  Service: General;  Laterality: N/A;  . LAPAROSCOPY  07/03/2012   Procedure: LAPAROSCOPY DIAGNOSTIC;  Surgeon: Pedro Earls, MD;  Location: [facility];  Service: General;  Laterality: N/A;  Exploratory Laparoscopy   . MESH APPLIED TO LAP PORT  07/03/2012   Procedure: MESH APPLIED TO LAP PORT;  Surgeon: Pedro Earls, MD;  Location: [facility];  Service: General;  Laterality: N/A;  . [place]   X 2  . right knee  1981   ARTHROSCOPY AND ARTHROTOMY  . right knee arthroscopy and arthrotomy  12-1979  . TONSILLECTOMY  1971  . TUBAL LIGATION  1993   WITH C -SECTION    Family History  Problem Relation Age of Onset  . Diabetes Mother   . Stroke Mother   . Breast cancer Mother   . Atrial fibrillation Mother   . Hypertension Mother   . Cancer Mother        breast    . Diabetes Father   . Stroke Father   . Hypertension Father   . Heart attack Father   . Thyroid disease Unknown   . Heart disease Unknown   . COPD Other     Social History   Socioeconomic History  . Marital status: Married    Spouse name: Not on file  . Number of children: Not on file  . Years of education: Not on file  . Highest education level: Not on file  Occupational History  . Occupation: Programmer, multimedia: [employer]  Social Needs  . Financial resource strain: Not on file  . Food insecurity:    Worry: Not on file    Inability: Not on file  . Transportation needs:    Medical: Not on file    Non-medical: Not on file  Tobacco Use  . Smoking status: Never Smoker  . Smokeless tobacco: Never Used  Substance and Sexual Activity  . Alcohol use: No    Alcohol/week: 0.0 oz  . Drug use: No  . Sexual activity: Yes    Birth control/protection: None  Lifestyle  . Physical activity:    Days per week: Not on file    Minutes per session: Not on file  . Stress: Not on file  Relationships  . Social connections:    Talks on phone: Not on file    Gets together: Not on file    Attends religious service: Not on file    Active member of club or organization: Not on file    Attends meetings of clubs or organizations: Not on file    Relationship status: Not on file  . Intimate partner violence:    Fear of current or ex partner: Not on file    Emotionally abused: Not on file    Physically abused: Not on file    Forced sexual activity: Not on file  Other Topics Concern  . Not on file  Social History Narrative  . Not on file    Outpatient Medications Prior to Visit  Medication Sig Dispense Refill  . Blood Glucose Monitoring Suppl (BAYER CONTOUR LINK MONITOR) W/DEVICE KIT Test blood sugar once daily DX code: E11.9 1 kit 1  . Ca Phosphate-Cholecalciferol (CALTRATE GUMMY BITES PO) Take 2 each 2 (two) times daily by mouth.     . citalopram (CELEXA) 20 MG tablet  TAKE 1 TABLET (20 MG TOTAL) BY MOUTH EVERY EVENING. 90 tablet 3  . cyclobenzaprine (FLEXERIL) 10 MG tablet TAKE 1 TABLET (10 MG TOTAL) BY MOUTH 3 TIMES DAILY AS NEEDED FOR MUSCLE SPASMS. 30 tablet 0  . cyclobenzaprine (  FLEXERIL) 10 MG tablet Take 1 tablet (10 mg total) by mouth 3 (three) times daily as needed for muscle spasms. 30 tablet 0  . estradiol (VIVELLE-DOT) 0.1 MG/24HR Place 1 patch (0.1 mg total) onto the skin 2 (two) times a week. Monday and Thursday 8 patch 11  . furosemide (LASIX) 20 MG tablet TAKE 1 TABLET (20 MG TOTAL) BY MOUTH EVERY MORNING. 90 tablet 1  . glucose blood (ONETOUCH VERIO) test strip Check blood sugar daily 100 each 12  . glucose blood test strip Test blood sugar once daily. Dx Code: E11.9 100 each 12  . HYDROcodone-acetaminophen (NORCO/VICODIN) 5-325 MG tablet Take 1 tablet by mouth every 6 (six) hours as needed for moderate pain. 30 tablet 0  . loratadine (CLARITIN) 10 MG tablet Take 10 mg by mouth every evening.     . methocarbamol (ROBAXIN) 500 MG tablet Take 1 tablet (500 mg total) by mouth 4 (four) times daily. 10 tablet 1  . metoCLOPramide (REGLAN) 10 MG tablet Take 10 mg by mouth at bedtime.    Marland Kitchen olmesartan (BENICAR) 5 MG tablet Take 1 tablet (5 mg total) by mouth daily. 90 tablet 3  . ONETOUCH DELICA LANCETS FINE MISC check blood sugar daily 100 each 12  . pantoprazole (PROTONIX) 40 MG tablet Take 1 tablet (40 mg total) by mouth daily. (Patient taking differently: Take 40 mg at bedtime by mouth. ) 90 tablet 3  . Pediatric Multivit-Minerals-C (ONE-A-DAY SCOOBY-DOO GUMMIES PO) Take 1 tablet 2 (two) times daily by mouth.     . potassium chloride (K-DUR,KLOR-CON) 10 MEQ tablet TAKE 1 TABLET (10 MEQ TOTAL) BY MOUTH DAILY. 90 tablet 1  . PREMARIN vaginal cream Apply 1 application topically 2 (two) times a week.  1  . ranitidine (ZANTAC) 150 MG tablet Take 1 tablet (150 mg total) by mouth 2 (two) times daily. 180 tablet 3  . rosuvastatin (CRESTOR) 20 MG tablet TAKE 1  TABLET (20 MG TOTAL) BY MOUTH AT BEDTIME. 90 tablet 1  . sucralfate (CARAFATE) 1 GM/10ML suspension Take 1 g at bedtime by mouth.    . thiamine (VITAMIN B-1) 100 MG tablet Take 100 mg daily by mouth.     No facility-administered medications prior to visit.     Allergies  Allergen Reactions  . Isoniazid     Severe SOB  . Nitrofurantoin Shortness Of Breath    REACTION: whelps  . Hctz [Hydrochlorothiazide]     rash  . Promethazine Hcl [Promethazine Hcl]     IF GIVEN  IV-hallucinations     CAN TAKE PO PHENERGAN  . Tamiflu [Oseltamivir Phosphate] Nausea And Vomiting    "I vomited within 30 minutes of taking it and was told I cannot ever take it again."  . Macrolides And Ketolides     rash  . Morphine     REACTION: severe vomiting  . Percocet [Oxycodone-Acetaminophen]     REACTION: severe itching. Can take by mouth codeine  . Roxicet [Oxycodone-Acetaminophen]     itching  . Sulfamethoxazole-Trimethoprim     Septra / Bactrim  --REACTION: rash  . Fentanyl Hives and Rash    REACTION: whelps    Review of Systems  Constitutional: Negative for fever and malaise/fatigue.  HENT: Negative for congestion.   Eyes: Negative for blurred vision.  Respiratory: Negative for cough and shortness of breath.   Cardiovascular: Negative for chest pain, palpitations and leg swelling.  Gastrointestinal: Negative for vomiting.  Musculoskeletal: Negative for back pain.  Right hand pain   Skin: Negative for rash.  Neurological: Positive for tingling (right hand). Negative for loss of consciousness and headaches.       Objective:    Physical Exam  Constitutional: She is oriented to person, place, and time.  Neurological: She is alert and oriented to person, place, and time.  Numbness and tingling both hands R >L Thumb , second, third , fourth fingers    BP 126/70 (BP Location: Right Arm, Cuff Size: Normal)   Pulse 72   Temp 98.3 F (36.8 C) (Oral)   Resp 16   Ht '5\' 11"'  (1.803 m)    Wt 199 lb 9.6 oz (90.5 kg)   SpO2 98%   BMI 27.84 kg/m  Wt Readings from Last 3 Encounters:  10/18/17 199 lb 9.6 oz (90.5 kg)  10/06/17 196 lb (88.9 kg)  08/01/17 198 lb 9.6 oz (90.1 kg)   BP Readings from Last 3 Encounters:  10/18/17 126/70  10/06/17 126/76  06/07/17 (!) 145/67     Immunization History  Administered Date(s) Administered  . H1N1 07/23/2008  . Influenza Whole 04/24/2008  . Influenza,inj,Quad PF,6+ Mos 04/11/2013  . Influenza-Unspecified 04/25/2014, 03/13/2015, 03/12/2016  . Pneumococcal Polysaccharide-23 07/04/2013  . Td 01/02/2009  . Zoster Recombinat (Shingrix) 03/21/2017, 05/25/2017    Health Maintenance  Topic Date Due  . COLONOSCOPY  01/04/2017  . INFLUENZA VACCINE  02/09/2018  . FOOT EXAM  03/07/2018  . OPHTHALMOLOGY EXAM  03/09/2018  . HEMOGLOBIN A1C  04/08/2018  . MAMMOGRAM  06/16/2018  . PNEUMOCOCCAL POLYSACCHARIDE VACCINE (2) 07/04/2018  . TETANUS/TDAP  01/03/2019  . PAP SMEAR  09/07/2019  . Hepatitis C Screening  Completed  . HIV Screening  Completed    Lab Results  Component Value Date   WBC 15.4 (H) 06/07/2017   HGB 11.8 (L) 06/07/2017   HCT 36.1 06/07/2017   PLT 206 06/07/2017   GLUCOSE 122 (H) 10/06/2017   CHOL 162 10/06/2017   TRIG 91.0 10/06/2017   HDL 59.50 10/06/2017   LDLCALC 84 10/06/2017   ALT 14 10/06/2017   AST 14 10/06/2017   NA 139 10/06/2017   K 4.1 10/06/2017   CL 100 10/06/2017   CREATININE 0.72 10/06/2017   BUN 11 10/06/2017   CO2 33 (H) 10/06/2017   TSH 1.87 12/24/2013   HGBA1C 5.8 10/06/2017   MICROALBUR 0.1 08/01/2014    Lab Results  Component Value Date   TSH 1.87 12/24/2013   Lab Results  Component Value Date   WBC 15.4 (H) 06/07/2017   HGB 11.8 (L) 06/07/2017   HCT 36.1 06/07/2017   MCV 86.8 06/07/2017   PLT 206 06/07/2017   Lab Results  Component Value Date   NA 139 10/06/2017   K 4.1 10/06/2017   CO2 33 (H) 10/06/2017   GLUCOSE 122 (H) 10/06/2017   BUN 11 10/06/2017   CREATININE  0.72 10/06/2017   BILITOT 0.6 10/06/2017   ALKPHOS 67 10/06/2017   AST 14 10/06/2017   ALT 14 10/06/2017   PROT 7.8 10/06/2017   ALBUMIN 4.1 10/06/2017   CALCIUM 9.6 10/06/2017   ANIONGAP 9 05/31/2017   GFR 87.60 10/06/2017   Lab Results  Component Value Date   CHOL 162 10/06/2017   Lab Results  Component Value Date   HDL 59.50 10/06/2017   Lab Results  Component Value Date   LDLCALC 84 10/06/2017   Lab Results  Component Value Date   TRIG 91.0 10/06/2017   Lab Results  Component  Value Date   CHOLHDL 3 10/06/2017   Lab Results  Component Value Date   HGBA1C 5.8 10/06/2017         Assessment & Plan:   Problem List Items Addressed This Visit    None    Visit Diagnoses    Bilateral hand numbness    -  Primary   Relevant Orders   Ambulatory referral to Hand Surgery   Insomnia, unspecified type       Relevant Medications   traZODone (DESYREL) 50 MG tablet      I am having Charmaine Downs. [place] start on traZODone. I am also having her maintain her loratadine, Ca Phosphate-Cholecalciferol (CALTRATE GUMMY BITES PO), Pediatric Multivit-Minerals-C (ONE-A-DAY SCOOBY-DOO GUMMIES PO), estradiol, ONETOUCH DELICA LANCETS FINE, BAYER CONTOUR LINK MONITOR, glucose blood, PREMARIN, glucose blood, cyclobenzaprine, metoCLOPramide, furosemide, olmesartan, pantoprazole, potassium chloride, rosuvastatin, ranitidine, citalopram, sucralfate, thiamine, methocarbamol, cyclobenzaprine, and HYDROcodone-acetaminophen.  Meds ordered this encounter  Medications  . traZODone (DESYREL) 50 MG tablet    Sig: Take 0.5-1 tablets (25-50 mg total) by mouth at bedtime as needed for sleep.    Dispense:  30 tablet    Refill:  3    CMA served as scribe during this visit. History, Physical and Plan performed by medical provider. Documentation and orders reviewed and attested to.  [name], DO

## 2017-10-19 MED FILL — traZODone HCL 50 MG TABS: 50 | 30 days supply | Qty: 30 | Fill #0

## 2017-10-26 DIAGNOSIS — Z9884 Bariatric surgery status: Secondary | ICD-10-CM | POA: Diagnosis not present

## 2017-11-03 ENCOUNTER — Encounter: Payer: Self-pay | Admitting: Family Medicine

## 2017-11-03 ENCOUNTER — Ambulatory Visit (INDEPENDENT_AMBULATORY_CARE_PROVIDER_SITE_OTHER): Payer: 59 | Admitting: Family Medicine

## 2017-11-03 VITALS — BP 118/60 | HR 63 | Temp 98.0°F | Resp 16 | Ht 71.0 in | Wt 198.8 lb

## 2017-11-03 DIAGNOSIS — E785 Hyperlipidemia, unspecified: Secondary | ICD-10-CM | POA: Diagnosis not present

## 2017-11-03 DIAGNOSIS — Z0000 Encounter for general adult medical examination without abnormal findings: Secondary | ICD-10-CM

## 2017-11-03 DIAGNOSIS — IMO0002 Reserved for concepts with insufficient information to code with codable children: Secondary | ICD-10-CM

## 2017-11-03 DIAGNOSIS — E1151 Type 2 diabetes mellitus with diabetic peripheral angiopathy without gangrene: Secondary | ICD-10-CM | POA: Diagnosis not present

## 2017-11-03 DIAGNOSIS — E1165 Type 2 diabetes mellitus with hyperglycemia: Secondary | ICD-10-CM | POA: Diagnosis not present

## 2017-11-03 DIAGNOSIS — E119 Type 2 diabetes mellitus without complications: Secondary | ICD-10-CM

## 2017-11-03 DIAGNOSIS — I10 Essential (primary) hypertension: Secondary | ICD-10-CM | POA: Diagnosis not present

## 2017-11-03 NOTE — Patient Instructions (Signed)
Preventive Care 40-64 Years, Female Preventive care refers to lifestyle choices and visits with your health care provider that can promote health and wellness. What does preventive care include?  A yearly physical exam. This is also called an annual well check.  Dental exams once or twice a year.  Routine eye exams. Ask your health care provider how often you should have your eyes checked.  Personal lifestyle choices, including: ? Daily care of your teeth and gums. ? Regular physical activity. ? Eating a healthy diet. ? Avoiding tobacco and drug use. ? Limiting alcohol use. ? Practicing safe sex. ? Taking low-dose aspirin daily starting at age 58. ? Taking vitamin and mineral supplements as recommended by your health care provider. What happens during an annual well check? The services and screenings done by your health care provider during your annual well check will depend on your age, overall health, lifestyle risk factors, and family history of disease. Counseling Your health care provider may ask you questions about your:  Alcohol use.  Tobacco use.  Drug use.  Emotional well-being.  Home and relationship well-being.  Sexual activity.  Eating habits.  Work and work Statistician.  Method of birth control.  Menstrual cycle.  Pregnancy history.  Screening You may have the following tests or measurements:  Height, weight, and BMI.  Blood pressure.  Lipid and cholesterol levels. These may be checked every 5 years, or more frequently if you are over 81 years old.  Skin check.  Lung cancer screening. You may have this screening every year starting at age 78 if you have a 30-pack-year history of smoking and currently smoke or have quit within the past 15 years.  Fecal occult blood test (FOBT) of the stool. You may have this test every year starting at age 65.  Flexible sigmoidoscopy or colonoscopy. You may have a sigmoidoscopy every 5 years or a colonoscopy  every 10 years starting at age 30.  Hepatitis C blood test.  Hepatitis B blood test.  Sexually transmitted disease (STD) testing.  Diabetes screening. This is done by checking your blood sugar (glucose) after you have not eaten for a while (fasting). You may have this done every 1-3 years.  Mammogram. This may be done every 1-2 years. Talk to your health care provider about when you should start having regular mammograms. This may depend on whether you have a family history of breast cancer.  BRCA-related cancer screening. This may be done if you have a family history of breast, ovarian, tubal, or peritoneal cancers.  Pelvic exam and Pap test. This may be done every 3 years starting at age 80. Starting at age 36, this may be done every 5 years if you have a Pap test in combination with an HPV test.  Bone density scan. This is done to screen for osteoporosis. You may have this scan if you are at high risk for osteoporosis.  Discuss your test results, treatment options, and if necessary, the need for more tests with your health care provider. Vaccines Your health care provider may recommend certain vaccines, such as:  Influenza vaccine. This is recommended every year.  Tetanus, diphtheria, and acellular pertussis (Tdap, Td) vaccine. You may need a Td booster every 10 years.  Varicella vaccine. You may need this if you have not been vaccinated.  Zoster vaccine. You may need this after age 5.  Measles, mumps, and rubella (MMR) vaccine. You may need at least one dose of MMR if you were born in  1956 or later. You may also need a second dose.  Pneumococcal 13-valent conjugate (PCV13) vaccine. You may need this if you have certain conditions and were not previously vaccinated.  Pneumococcal polysaccharide (PPSV23) vaccine. You may need one or two doses if you smoke cigarettes or if you have certain conditions.  Meningococcal vaccine. You may need this if you have certain  conditions.  Hepatitis A vaccine. You may need this if you have certain conditions or if you travel or work in places where you may be exposed to hepatitis A.  Hepatitis B vaccine. You may need this if you have certain conditions or if you travel or work in places where you may be exposed to hepatitis B.  Haemophilus influenzae type b (Hib) vaccine. You may need this if you have certain conditions.  Talk to your health care provider about which screenings and vaccines you need and how often you need them. This information is not intended to replace advice given to you by your health care provider. Make sure you discuss any questions you have with your health care provider. Document Released: 07/25/2015 Document Revised: 03/17/2016 Document Reviewed: 04/29/2015 Elsevier Interactive Patient Education  2018 [employer]  

## 2017-11-03 NOTE — Assessment & Plan Note (Signed)
Well controlled, no changes to meds. Encouraged heart healthy diet such as the DASH diet and exercise as tolerated.  °

## 2017-11-03 NOTE — Assessment & Plan Note (Signed)
Tolerating statin, encouraged heart healthy diet, avoid trans fats, minimize simple carbs and saturated fats. Increase exercise as tolerated 

## 2017-11-03 NOTE — Progress Notes (Signed)
Subjective:     [name] is a 62 y.o. female and is here for a comprehensive physical exam. The patient reports no new problems.  Social History   Socioeconomic History  . Marital status: Married    Spouse name: Not on file  . Number of children: Not on file  . Years of education: Not on file  . Highest education level: Not on file  Occupational History  . Occupation: Programmer, multimedia: [employer]  Social Needs  . Financial resource strain: Not on file  . Food insecurity:    Worry: Not on file    Inability: Not on file  . Transportation needs:    Medical: Not on file    Non-medical: Not on file  Tobacco Use  . Smoking status: Never Smoker  . Smokeless tobacco: Never Used  Substance and Sexual Activity  . Alcohol use: No    Alcohol/week: 0.0 oz  . Drug use: No  . Sexual activity: Yes    Birth control/protection: None  Lifestyle  . Physical activity:    Days per week: Not on file    Minutes per session: Not on file  . Stress: Not on file  Relationships  . Social connections:    Talks on phone: Not on file    Gets together: Not on file    Attends religious service: Not on file    Active member of club or organization: Not on file    Attends meetings of clubs or organizations: Not on file    Relationship status: Not on file  . Intimate partner violence:    Fear of current or ex partner: Not on file    Emotionally abused: Not on file    Physically abused: Not on file    Forced sexual activity: Not on file  Other Topics Concern  . Not on file  Social History Narrative  . Not on file   Health Maintenance  Topic Date Due  . COLONOSCOPY  01/04/2017  . INFLUENZA VACCINE  02/09/2018  . FOOT EXAM  03/07/2018  . OPHTHALMOLOGY EXAM  03/09/2018  . HEMOGLOBIN A1C  04/08/2018  . MAMMOGRAM  06/16/2018  . PNEUMOCOCCAL POLYSACCHARIDE VACCINE (2) 07/04/2018  . TETANUS/TDAP  01/03/2019  . PAP SMEAR  09/07/2019  . Hepatitis C Screening  Completed  . HIV  Screening  Completed    The following portions of the patient's history were reviewed and updated as appropriate:  She  has a past medical history of Allergic rhinitis, Anxiety, Asthma, Depression, Diabetes mellitus without complication ([place]), GERD (gastroesophageal reflux disease), Headache(784.0), Hyperlipemia, Hypertension, IBS (irritable bowel syndrome), Menopause, Neuromuscular disorder ([place]) (2009), Stress incontinence, and Tibial fracture (10/14/2016). She does not have any pertinent problems on file. She  has a past surgical history that includes Cesarean section ([place]); Cervical laminectomy (2005 & 2009); Foot surgery (2005); right knee (1981); Nasal sinus surgery ([place]); Laparoscopic gastric banding (12/30/09); Esophagogastroduodenoscopy (06/29/2011); Hysteroscopy (1999); laparoscopy (09/24/2011); Gastric banding port revision (09/24/2011); Tonsillectomy (1971); Cholecystectomy (1986); Tubal ligation (1993); right knee arthroscopy and arthrotomy ([id number]); Cervical conization w/bx (june 1990); laparoscopy (07/03/2012); Laparoscopic revision of gastric band (07/03/2012); Mesh applied to lap port (07/03/2012); Dilation and curettage of uterus ( april-1989); Laparoscopic repair and removal of gastric band (N/A, 07/02/2013); Laparoscopic gastric sleeve resection (N/A, 07/02/2013); Abdominal hysterectomy (12/1999); and Gastric Roux-En-Y (N/A, 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 Her family history includes Atrial fibrillation in her mother; Breast cancer in her mother; COPD in her other;  Cancer in her mother; Diabetes in her father and mother; Heart attack in her father; Heart disease in her unknown relative; Hypertension in her father and mother; Stroke in her father and mother; Thyroid disease in her unknown relative. She  reports that she has never smoked. She has never used smokeless tobacco. She reports that she does not drink alcohol or use drugs. She has a current medication list which includes the following  prescription(s): bayer contour link monitor, ca phosphate-cholecalciferol, citalopram, cyclobenzaprine, estradiol, furosemide, glucose blood, glucose blood, hydrocodone-acetaminophen, loratadine, methocarbamol, metoclopramide, olmesartan, onetouch delica lancets fine, pantoprazole, pediatric multivit-minerals-c, potassium chloride, premarin, ranitidine, rosuvastatin, sucralfate, thiamine, and trazodone. Current Outpatient Medications on File Prior to Visit  Medication Sig Dispense Refill  . Blood Glucose Monitoring Suppl (BAYER CONTOUR LINK MONITOR) W/DEVICE KIT Test blood sugar once daily DX code: E11.9 1 kit 1  . Ca Phosphate-Cholecalciferol (CALTRATE GUMMY BITES PO) Take 2 each 2 (two) times daily by mouth.     . citalopram (CELEXA) 20 MG tablet TAKE 1 TABLET (20 MG TOTAL) BY MOUTH EVERY EVENING. 90 tablet 3  . cyclobenzaprine (FLEXERIL) 10 MG tablet Take 1 tablet (10 mg total) by mouth 3 (three) times daily as needed for muscle spasms. 30 tablet 0  . estradiol (VIVELLE-DOT) 0.1 MG/24HR Place 1 patch (0.1 mg total) onto the skin 2 (two) times a week. Monday and Thursday 8 patch 11  . furosemide (LASIX) 20 MG tablet TAKE 1 TABLET (20 MG TOTAL) BY MOUTH EVERY MORNING. 90 tablet 1  . glucose blood (ONETOUCH VERIO) test strip Check blood sugar daily 100 each 12  . glucose blood test strip Test blood sugar once daily. Dx Code: E11.9 100 each 12  . HYDROcodone-acetaminophen (NORCO/VICODIN) 5-325 MG tablet Take 1 tablet by mouth every 6 (six) hours as needed for moderate pain. 30 tablet 0  . loratadine (CLARITIN) 10 MG tablet Take 10 mg by mouth every evening.     . methocarbamol (ROBAXIN) 500 MG tablet Take 1 tablet (500 mg total) by mouth 4 (four) times daily. 10 tablet 1  . metoCLOPramide (REGLAN) 10 MG tablet Take 10 mg by mouth at bedtime.    Marland Kitchen olmesartan (BENICAR) 5 MG tablet Take 1 tablet (5 mg total) by mouth daily. 90 tablet 3  . ONETOUCH DELICA LANCETS FINE MISC check blood sugar daily 100 each  12  . pantoprazole (PROTONIX) 40 MG tablet Take 1 tablet (40 mg total) by mouth daily. (Patient taking differently: Take 40 mg at bedtime by mouth. ) 90 tablet 3  . Pediatric Multivit-Minerals-C (ONE-A-DAY SCOOBY-DOO GUMMIES PO) Take 1 tablet 2 (two) times daily by mouth.     . potassium chloride (K-DUR,KLOR-CON) 10 MEQ tablet TAKE 1 TABLET (10 MEQ TOTAL) BY MOUTH DAILY. 90 tablet 1  . PREMARIN vaginal cream Apply 1 application topically 2 (two) times a week.  1  . ranitidine (ZANTAC) 150 MG tablet Take 1 tablet (150 mg total) by mouth 2 (two) times daily. 180 tablet 3  . rosuvastatin (CRESTOR) 20 MG tablet TAKE 1 TABLET (20 MG TOTAL) BY MOUTH AT BEDTIME. 90 tablet 1  . sucralfate (CARAFATE) 1 GM/10ML suspension Take 1 g at bedtime by mouth.    . thiamine (VITAMIN B-1) 100 MG tablet Take 100 mg daily by mouth.    . traZODone (DESYREL) 50 MG tablet Take 0.5-1 tablets (25-50 mg total) by mouth at bedtime as needed for sleep. 30 tablet 3   No current facility-administered medications on file prior to visit.  She is allergic to isoniazid; nitrofurantoin; hctz [hydrochlorothiazide]; promethazine hcl [promethazine hcl]; tamiflu [oseltamivir phosphate]; macrolides and ketolides; morphine; percocet [oxycodone-acetaminophen]; roxicet [oxycodone-acetaminophen]; sulfamethoxazole-trimethoprim; and fentanyl..  Review of Systems Review of Systems  Constitutional: Negative for activity change, appetite change and fatigue.  HENT: Negative for hearing loss, congestion, tinnitus and ear discharge.  dentist [id number]: Negative for visual disturbance (see optho q1y -- vision corrected to 20/20 with glasses).  Respiratory: Negative for cough, chest tightness and shortness of breath.   Cardiovascular: Negative for chest pain, palpitations and leg swelling.  Gastrointestinal: Negative for abdominal pain, diarrhea, constipation and abdominal distention.  Genitourinary: Negative for urgency, frequency, decreased  urine volume and difficulty urinating.  Musculoskeletal: Negative for back pain, arthralgias and gait problem.  Skin: Negative for color change, pallor and rash.  Neurological: Negative for dizziness, light-headedness, numbness and headaches.  Hematological: Negative for adenopathy. Does not bruise/bleed easily.  Psychiatric/Behavioral: Negative for suicidal ideas, confusion, sleep disturbance, self-injury, dysphoric mood, decreased concentration and agitation.       Objective:    BP 118/60 (BP Location: Right Arm, Cuff Size: Normal)   Pulse 63   Temp 98 F (36.7 C) (Oral)   Resp 16   Ht '5\' 11"'  (1.803 m)   Wt 198 lb 12.8 oz (90.2 kg)   SpO2 97%   BMI 27.73 kg/m  General appearance: alert, cooperative, appears stated age and no distress Head: Normocephalic, without obvious abnormality, atraumatic Eyes: conjunctivae/corneas clear. PERRL, EOM's intact. Fundi benign. Ears: normal TM's and external ear canals both ears Nose: Nares normal. Septum midline. Mucosa normal. No drainage or sinus tenderness. Throat: lips, mucosa, and tongue normal; teeth and gums normal Neck: no adenopathy, no carotid bruit, no JVD, supple, symmetrical, trachea midline and thyroid not enlarged, symmetric, no tenderness/mass/nodules Back: symmetric, no curvature. ROM normal. No CVA tenderness. Lungs: clear to auscultation bilaterally Breasts: gyn Heart: regular rate and rhythm, S1, S2 normal, no murmur, click, rub or gallop Abdomen: soft, non-tender; bowel sounds normal; no masses,  no organomegaly Pelvic: deferred --gyn Extremities: extremities normal, atraumatic, no cyanosis or edema Pulses: 2+ and symmetric Skin: Skin color, texture, turgor normal. No rashes or lesions Lymph nodes: Cervical, supraclavicular, and axillary nodes normal. Neurologic: Alert and oriented X 3, normal strength and tone. Normal symmetric reflexes. Normal coordination and gait    Assessment:    Healthy female exam.       Plan:    ghm utd Check labs  See After Visit Summary for Counseling Recommendations    1. Preventative health care ghm utd See AVS - CBC with Differential/Platelet; Future - Hemoglobin A1c; Future - Lipid panel; Future - Comprehensive metabolic panel; Future  2. Diabetes mellitus without complication (HCC) [id number], minimize simple carbs. Increase exercise as tolerated. Continue current meds  - CBC with Differential/Platelet; Future - Hemoglobin A1c; Future - Lipid panel; Future - Comprehensive metabolic panel; Future  3. DM (diabetes mellitus) type II uncontrolled, periph vascular disorder ([place]   4. Hyperlipidemia LDL goal <100 Tolerating statin, encouraged heart healthy diet, avoid trans fats, minimize simple carbs and saturated fats. Increase exercise as tolerated  5. Essential hypertension Well controlled, no changes to meds. Encouraged heart healthy diet such as the DASH diet and exercise as tolerated.   6. Hyperlipidemia, unspecified hyperlipidemia type

## 2017-11-03 NOTE — Assessment & Plan Note (Signed)
hgba1c acceptable, minimize simple carbs. Increase exercise as tolerated. Continue current meds Lab Results  Component Value Date   HGBA1C 5.8 10/06/2017

## 2017-11-03 NOTE — Assessment & Plan Note (Signed)
Encouraged heart healthy diet, increase exercise, avoid trans fats, consider a krill oil cap daily 

## 2017-11-08 DIAGNOSIS — M79641 Pain in right hand: Secondary | ICD-10-CM | POA: Insufficient documentation

## 2017-11-08 DIAGNOSIS — G5601 Carpal tunnel syndrome, right upper limb: Secondary | ICD-10-CM | POA: Diagnosis not present

## 2017-11-08 DIAGNOSIS — G5602 Carpal tunnel syndrome, left upper limb: Secondary | ICD-10-CM | POA: Diagnosis not present

## 2017-11-08 DIAGNOSIS — M79642 Pain in left hand: Secondary | ICD-10-CM | POA: Diagnosis not present

## 2017-11-11 ENCOUNTER — Telehealth: Payer: Self-pay | Admitting: Internal Medicine

## 2017-11-11 NOTE — Telephone Encounter (Signed)
Patient states she needs to schedule her recall colon from 2018 at the hosp due to gastric bypass. Please advise.

## 2017-11-14 NOTE — Telephone Encounter (Signed)
Patient thinks that due to having hospital insurance she needs to have her procedure at the hospital. She would prefer to prep on a Thursday and have colon on a Friday. Will check with [facility] regarding insurance.

## 2017-11-17 NOTE — Telephone Encounter (Signed)
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 Precert stated she could be done here. Pt scheduled for previsit 01/04/18@8 :30am, colon scheduled in the [facility] 01/20/18@3 :30pm. Pt aware.

## 2017-11-22 ENCOUNTER — Other Ambulatory Visit: Payer: Self-pay | Admitting: Family Medicine

## 2017-11-22 DIAGNOSIS — I10 Essential (primary) hypertension: Secondary | ICD-10-CM

## 2017-11-22 MED FILL — FUROSEMIDE 20 MG TAB: 20 | 90 days supply | Qty: 90 | Fill #0

## 2017-11-22 MED FILL — CITALOPRAM HBR 20 MG TABLET: 20 | 90 days supply | Qty: 90 | Fill #2

## 2017-11-22 MED FILL — OLMESARTAN MEDOXOMIL 5 MG T: 5 | 90 days supply | Qty: 90 | Fill #0

## 2017-11-23 MED FILL — TEMAZEPAM 30 MG CAPSULE: 30 | 30 days supply | Qty: 30 | Fill #0

## 2017-11-25 ENCOUNTER — Encounter: Payer: Self-pay | Admitting: Family Medicine

## 2017-11-25 NOTE — Telephone Encounter (Signed)
Pt called - she would like call back as to how to schedule this appt.  She is not able to come in mon afternoon,and Tuesday she will be in [place] cb is [phone]

## 2017-11-28 DIAGNOSIS — G5602 Carpal tunnel syndrome, left upper limb: Secondary | ICD-10-CM | POA: Diagnosis not present

## 2017-11-28 DIAGNOSIS — G5601 Carpal tunnel syndrome, right upper limb: Secondary | ICD-10-CM | POA: Diagnosis not present

## 2017-12-01 ENCOUNTER — Encounter: Payer: Self-pay | Admitting: Family Medicine

## 2017-12-01 ENCOUNTER — Ambulatory Visit (INDEPENDENT_AMBULATORY_CARE_PROVIDER_SITE_OTHER): Payer: 59 | Admitting: Family Medicine

## 2017-12-01 VITALS — BP 122/64 | HR 70 | Temp 97.9°F | Resp 16 | Ht 71.0 in | Wt 194.8 lb

## 2017-12-01 DIAGNOSIS — L559 Sunburn, unspecified: Secondary | ICD-10-CM | POA: Diagnosis not present

## 2017-12-01 MED ORDER — SILVER SULFADIAZINE 1 % EX CREA: 1.0000 "application " | TOPICAL_CREAM | Freq: Every day | CUTANEOUS | 0 refills | Status: DC

## 2017-12-01 MED FILL — SSD 1% CREAM: 1 | 30 days supply | Qty: 400 | Fill #0

## 2017-12-01 NOTE — Progress Notes (Signed)
Subjective:  I acted as a Education administrator for [employer]. Yancey Flemings, [place]   Patient ID: [name], female    DOB: [birth year], 62 y.o.   MRN: [id number]  Chief Complaint  Patient presents with  . Sunburn    HPI  Patient is in today to be seen for sunburn.  No other complaints  Patient Care Team: Carollee Herter, Alferd Apa, DO as PCP - General (Family Medicine) Himmelrich, Bryson Ha, RD (Inactive) as Dietitian   Past Medical History:  Diagnosis Date  . Allergic rhinitis   . Anxiety   . Asthma    hx bronchial asthma at times with upper resp infection  . Depression   . Diabetes mellitus without complication (HCC)    diet controlled no meds  . GERD (gastroesophageal reflux disease)   . Headache(784.0)    migraine and cluster headaches  . Hyperlipemia   . Hypertension   . IBS (irritable bowel syndrome)   . Menopause   . Neuromuscular disorder ([place]) 2009   hx of fibromyalgia, polyarthralsia (surgery induced)  . Stress incontinence    at times  . Tibial fracture 10/14/2016   evulsion periostial right    Past Surgical History:  Procedure Laterality Date  . ABDOMINAL HYSTERECTOMY  12/1999   complete  . CERVICAL CONIZATION W/BX  june 1990   dysplasia of cervix/used 5Fu cream for 3 months  . CERVICAL LAMINECTOMY  2005 & 2009   X 2   NO ROM PROBLEMS  . [place]  . DILATION AND CURETTAGE OF UTERUS   [phone]   missed abortion  . ESOPHAGOGASTRODUODENOSCOPY  06/29/2011   Procedure: ESOPHAGOGASTRODUODENOSCOPY (EGD);  Surgeon: Shann Medal, MD;  Location: [facility];  Service: General;  Laterality: N/A;  . FOOT SURGERY  2005   RT HEEL  . GASTRIC BANDING PORT REVISION  09/24/2011   Procedure: GASTRIC BANDING PORT REVISION;  Surgeon: Pedro Earls, MD;  Location: [facility];  Service: General;  Laterality: N/A;  . GASTRIC ROUX-EN-Y N/A 06/06/2017   Procedure: Conversion from [place] to [place];  Surgeon: Johnathan Hausen, MD;  Location: [facility];  Service: General;  Laterality: N/A;  . HYSTEROSCOPY  1999  . LAPAROSCOPIC GASTRIC BANDING  12/30/09  . LAPAROSCOPIC GASTRIC SLEEVE RESECTION N/A 07/02/2013   Procedure: LAPAROSCOPIC GASTRIC SLEEVE RESECTION upper endoscopy;  Surgeon: Pedro Earls, MD;  Location: [facility];  Service: General;  Laterality: N/A;  . LAPAROSCOPIC REPAIR AND REMOVAL OF GASTRIC BAND N/A 07/02/2013   Procedure: LAPAROSCOPIC REMOVAL OF GASTRIC BAND;  Surgeon: Pedro Earls, MD;  Location: [facility];  Service: General;  Laterality: N/A;  . LAPAROSCOPIC REVISION OF GASTRIC BAND  07/03/2012   Procedure: LAPAROSCOPIC REVISION OF GASTRIC BAND;  Surgeon: Pedro Earls, MD;  Location: [facility];  Service: General;  Laterality: N/A;  removal of old lap. band port, replaced with AP standard band  . LAPAROSCOPY  09/24/2011   Procedure: LAPAROSCOPY DIAGNOSTIC;  Surgeon: Pedro Earls, MD;  Location: [facility];  Service: General;  Laterality: N/A;  . LAPAROSCOPY  07/03/2012   Procedure: LAPAROSCOPY DIAGNOSTIC;  Surgeon: Pedro Earls, MD;  Location: [facility];  Service: General;  Laterality: N/A;  Exploratory Laparoscopy   . MESH APPLIED TO LAP PORT  07/03/2012   Procedure: MESH APPLIED TO LAP PORT;  Surgeon: Pedro Earls, MD;  Location: [facility];  Service: General;  Laterality: N/A;  . [place]   X 2  . right knee  1981   ARTHROSCOPY AND ARTHROTOMY  . right knee arthroscopy and arthrotomy  12-1979  . TONSILLECTOMY  1971  . TUBAL LIGATION  1993   WITH C -SECTION    Family History  Problem Relation Age of Onset  . Diabetes Mother   . Stroke Mother   . Breast cancer Mother   . Atrial fibrillation Mother   . Hypertension Mother   . Cancer Mother        breast  . Diabetes Father   . Stroke Father   . Hypertension Father   . Heart attack Father   . Thyroid disease Unknown   . Heart disease Unknown   . COPD Other     Social  History   Socioeconomic History  . Marital status: Married    Spouse name: Not on file  . Number of children: Not on file  . Years of education: Not on file  . Highest education level: Not on file  Occupational History  . Occupation: Programmer, multimedia: [employer]  Social Needs  . Financial resource strain: Not on file  . Food insecurity:    Worry: Not on file    Inability: Not on file  . Transportation needs:    Medical: Not on file    Non-medical: Not on file  Tobacco Use  . Smoking status: Never Smoker  . Smokeless tobacco: Never Used  Substance and Sexual Activity  . Alcohol use: No    Alcohol/week: 0.0 oz  . Drug use: No  . Sexual activity: Yes    Birth control/protection: None  Lifestyle  . Physical activity:    Days per week: Not on file    Minutes per session: Not on file  . Stress: Not on file  Relationships  . Social connections:    Talks on phone: Not on file    Gets together: Not on file    Attends religious service: Not on file    Active member of club or organization: Not on file    Attends meetings of clubs or organizations: Not on file    Relationship status: Not on file  . Intimate partner violence:    Fear of current or ex partner: Not on file    Emotionally abused: Not on file    Physically abused: Not on file    Forced sexual activity: Not on file  Other Topics Concern  . Not on file  Social History Narrative  . Not on file    Outpatient Medications Prior to Visit  Medication Sig Dispense Refill  . Blood Glucose Monitoring Suppl (BAYER CONTOUR LINK MONITOR) W/DEVICE KIT Test blood sugar once daily DX code: E11.9 1 kit 1  . Ca Phosphate-Cholecalciferol (CALTRATE GUMMY BITES PO) Take 2 each 2 (two) times daily by mouth.     . citalopram (CELEXA) 20 MG tablet TAKE 1 TABLET (20 MG TOTAL) BY MOUTH EVERY EVENING. 90 tablet 3  . cyclobenzaprine (FLEXERIL) 10 MG tablet Take 1 tablet (10 mg total) by mouth 3 (three) times daily as needed  for muscle spasms. 30 tablet 0  . estradiol (VIVELLE-DOT) 0.1 MG/24HR Place 1 patch (0.1 mg total) onto the skin 2 (two) times a week. Monday and Thursday 8 patch 11  . furosemide (LASIX) 20 MG tablet TAKE 1 TABLET BY MOUTH EVERY MORNING 90 tablet 1  . glucose blood (  ONETOUCH VERIO) test strip Check blood sugar daily 100 each 12  . glucose blood test strip Test blood sugar once daily. Dx Code: E11.9 100 each 12  . HYDROcodone-acetaminophen (NORCO/VICODIN) 5-325 MG tablet Take 1 tablet by mouth every 6 (six) hours as needed for moderate pain. 30 tablet 0  . loratadine (CLARITIN) 10 MG tablet Take 10 mg by mouth every evening.     . methocarbamol (ROBAXIN) 500 MG tablet Take 1 tablet (500 mg total) by mouth 4 (four) times daily. 10 tablet 1  . metoCLOPramide (REGLAN) 10 MG tablet Take 10 mg by mouth at bedtime.    Marland Kitchen olmesartan (BENICAR) 5 MG tablet Take 1 tablet (5 mg total) by mouth daily. 90 tablet 3  . ONETOUCH DELICA LANCETS FINE MISC check blood sugar daily 100 each 12  . pantoprazole (PROTONIX) 40 MG tablet Take 1 tablet (40 mg total) by mouth daily. (Patient taking differently: Take 40 mg at bedtime by mouth. ) 90 tablet 3  . Pediatric Multivit-Minerals-C (ONE-A-DAY SCOOBY-DOO GUMMIES PO) Take 1 tablet 2 (two) times daily by mouth.     . potassium chloride (K-DUR,KLOR-CON) 10 MEQ tablet TAKE 1 TABLET (10 MEQ TOTAL) BY MOUTH DAILY. 90 tablet 1  . PREMARIN vaginal cream Apply 1 application topically 2 (two) times a week.  1  . ranitidine (ZANTAC) 150 MG tablet Take 1 tablet (150 mg total) by mouth 2 (two) times daily. 180 tablet 3  . rosuvastatin (CRESTOR) 20 MG tablet TAKE 1 TABLET (20 MG TOTAL) BY MOUTH AT BEDTIME. 90 tablet 1  . sucralfate (CARAFATE) 1 GM/10ML suspension Take 1 g at bedtime by mouth.    . thiamine (VITAMIN B-1) 100 MG tablet Take 100 mg daily by mouth.    . traZODone (DESYREL) 50 MG tablet Take 0.5-1 tablets (25-50 mg total) by mouth at bedtime as needed for sleep. 30 tablet  3   No facility-administered medications prior to visit.     Allergies  Allergen Reactions  . Isoniazid     Severe SOB  . Nitrofurantoin Shortness Of Breath    REACTION: whelps  . Hctz [Hydrochlorothiazide]     rash  . Promethazine Hcl [Promethazine Hcl]     IF GIVEN  IV-hallucinations     CAN TAKE PO PHENERGAN  . Tamiflu [Oseltamivir Phosphate] Nausea And Vomiting    "I vomited within 30 minutes of taking it and was told I cannot ever take it again."  . Macrolides And Ketolides     rash  . Morphine     REACTION: severe vomiting  . Percocet [Oxycodone-Acetaminophen]     REACTION: severe itching. Can take by mouth codeine  . Roxicet [Oxycodone-Acetaminophen]     itching  . Sulfamethoxazole-Trimethoprim     Septra / Bactrim  --REACTION: rash  . Fentanyl Hives and Rash    REACTION: whelps    Review of Systems  Constitutional: Negative for chills, fever and malaise/fatigue.  HENT: Negative for congestion and hearing loss.   Eyes: Negative for blurred vision and discharge.  Respiratory: Negative for cough, sputum production and shortness of breath.   Cardiovascular: Negative for chest pain, palpitations and leg swelling.  Gastrointestinal: Negative for abdominal pain, blood in stool, constipation, diarrhea, heartburn, nausea and vomiting.  Genitourinary: Negative for dysuria, frequency, hematuria and urgency.  Musculoskeletal: Negative for back pain, falls and myalgias.  Skin: Negative for rash.  Neurological: Negative for dizziness, sensory change, loss of consciousness, weakness and headaches.  Endo/Heme/Allergies: Negative for environmental allergies. Does  not bruise/bleed easily.  Psychiatric/Behavioral: Negative for depression and suicidal ideas. The patient is not nervous/anxious and does not have insomnia.   All other systems reviewed and are negative.      Objective:    Physical Exam  Constitutional: She is oriented to person, place, and time. She appears  well-developed and well-nourished.  HENT:  Head: Normocephalic and atraumatic.  Eyes: Conjunctivae and EOM are normal.  Neck: Normal range of motion. Neck supple. No JVD present. Carotid bruit is not present. No thyromegaly present.  Cardiovascular: Normal rate, regular rhythm and normal heart sounds.  No murmur heard. Pulmonary/Chest: Effort normal and breath sounds normal. No respiratory distress. She has no wheezes. She has no rales. She exhibits no tenderness.  Musculoskeletal: She exhibits no edema.  Neurological: She is alert and oriented to person, place, and time.  Skin:     Psychiatric: She has a normal mood and affect.  Nursing note and vitals reviewed.   BP 122/64 (BP Location: Left Arm, Patient Position: Sitting, Cuff Size: Normal)   Pulse 70   Temp 97.9 F (36.6 C) (Oral)   Resp 16   Ht '5\' 11"'  (1.803 m)   Wt 194 lb 12.8 oz (88.4 kg)   SpO2 97%   BMI 27.17 kg/m  Wt Readings from Last 3 Encounters:  12/01/17 194 lb 12.8 oz (88.4 kg)  11/03/17 198 lb 12.8 oz (90.2 kg)  10/18/17 199 lb 9.6 oz (90.5 kg)   BP Readings from Last 3 Encounters:  12/01/17 122/64  11/03/17 118/60  10/18/17 126/70     Immunization History  Administered Date(s) Administered  . H1N1 07/23/2008  . Influenza Whole 04/24/2008  . Influenza,inj,Quad PF,6+ Mos 04/11/2013  . Influenza-Unspecified 04/25/2014, 03/13/2015, 03/12/2016  . Pneumococcal Polysaccharide-23 07/04/2013  . Td 01/02/2009  . Zoster Recombinat (Shingrix) 03/21/2017, 05/25/2017    Health Maintenance  Topic Date Due  . COLONOSCOPY  01/04/2017  . INFLUENZA VACCINE  02/09/2018  . FOOT EXAM  03/07/2018  . OPHTHALMOLOGY EXAM  03/09/2018  . HEMOGLOBIN A1C  04/08/2018  . MAMMOGRAM  06/16/2018  . PNEUMOCOCCAL POLYSACCHARIDE VACCINE (2) 07/04/2018  . TETANUS/TDAP  01/03/2019  . PAP SMEAR  09/07/2019  . Hepatitis C Screening  Completed  . HIV Screening  Completed    Lab Results  Component Value Date   WBC 15.4 (H)  06/07/2017   HGB 11.8 (L) 06/07/2017   HCT 36.1 06/07/2017   PLT 206 06/07/2017   GLUCOSE 122 (H) 10/06/2017   CHOL 162 10/06/2017   TRIG 91.0 10/06/2017   HDL 59.50 10/06/2017   LDLCALC 84 10/06/2017   ALT 14 10/06/2017   AST 14 10/06/2017   NA 139 10/06/2017   K 4.1 10/06/2017   CL 100 10/06/2017   CREATININE 0.72 10/06/2017   BUN 11 10/06/2017   CO2 33 (H) 10/06/2017   TSH 1.87 12/24/2013   HGBA1C 5.8 10/06/2017   MICROALBUR 0.1 08/01/2014    Lab Results  Component Value Date   TSH 1.87 12/24/2013   Lab Results  Component Value Date   WBC 15.4 (H) 06/07/2017   HGB 11.8 (L) 06/07/2017   HCT 36.1 06/07/2017   MCV 86.8 06/07/2017   PLT 206 06/07/2017   Lab Results  Component Value Date   NA 139 10/06/2017   K 4.1 10/06/2017   CO2 33 (H) 10/06/2017   GLUCOSE 122 (H) 10/06/2017   BUN 11 10/06/2017   CREATININE 0.72 10/06/2017   BILITOT 0.6 10/06/2017   ALKPHOS 67  10/06/2017   AST 14 10/06/2017   ALT 14 10/06/2017   PROT 7.8 10/06/2017   ALBUMIN 4.1 10/06/2017   CALCIUM 9.6 10/06/2017   ANIONGAP 9 05/31/2017   GFR 87.60 10/06/2017   Lab Results  Component Value Date   CHOL 162 10/06/2017   Lab Results  Component Value Date   HDL 59.50 10/06/2017   Lab Results  Component Value Date   LDLCALC 84 10/06/2017   Lab Results  Component Value Date   TRIG 91.0 10/06/2017   Lab Results  Component Value Date   CHOLHDL 3 10/06/2017   Lab Results  Component Value Date   HGBA1C 5.8 10/06/2017         Assessment & Plan:   Problem List Items Addressed This Visit    None    Visit Diagnoses    Sunburn    -  Primary   Relevant Medications   silver sulfADIAZINE (SILVADENE) 1 % cream      I am having [name] start on silver sulfADIAZINE. I am also having her maintain her loratadine, Ca Phosphate-Cholecalciferol (CALTRATE GUMMY BITES PO), Pediatric Multivit-Minerals-C (ONE-A-DAY SCOOBY-DOO GUMMIES PO), estradiol, ONETOUCH DELICA LANCETS  FINE, BAYER CONTOUR LINK MONITOR, glucose blood, PREMARIN, glucose blood, metoCLOPramide, olmesartan, pantoprazole, potassium chloride, rosuvastatin, ranitidine, citalopram, sucralfate, thiamine, methocarbamol, cyclobenzaprine, HYDROcodone-acetaminophen, traZODone, and furosemide.  Meds ordered this encounter  Medications  . silver sulfADIAZINE (SILVADENE) 1 % cream    Sig: Apply 1 application topically daily.    Dispense:  400 g    Refill:  0    CMA served as scribe during this visit. History, Physical and Plan performed by medical provider. Documentation and orders reviewed and attested to.  [name], DO

## 2017-12-01 NOTE — Patient Instructions (Signed)
Sunburn Sunburn is damage to the skin that is caused by overexposure to ultraviolet (UV) rays. Repeated sun exposure causes early skin aging, such as wrinkles and sun spots. It also increases the risk of skin cancer.  CAUSES Sunburn is caused by getting too much UV radiation from the sun. RISK FACTORS The following factors may make you more likely to develop this condition:  Having a family history of sensitivity to the sun.  Having certain diseases, such as lupus.  Taking certain medicines.  Using certain cosmetics.  Having light-colored skin (light complexion). SYMPTOMS Symptoms of this condition include:  Red or pink skin.  Soreness and swelling of the affected areas.  Pain.  Blisters.  Peeling skin. You may also have a headache, fever, or fatigue if the sunburn covers a large part of your body. DIAGNOSIS This condition is diagnosed with a medical history and physical exam. TREATMENT Treatment focuses on managing your symptoms. Treatment may include:  Medicines to reduce swelling.  Steroid medicines to help with inflammation and itching. These may be applied as creams or taken by mouth (orally).  Antibiotic cream or ointment to apply to any blisters that break open. HOME CARE INSTRUCTIONS Medicines  Take or apply over-the-counter and prescription medicines only as told by your health care provider.  If you were prescribed an antibiotic medicine, use it as told by your health care provider. Do not stop using the antibiotic even if your condition improves. General Instructions  Avoid further exposure to the sun. Protect sunburned skin by wearing clothing that covers the injured skin.  Do not put ice on your sunburn. This can cause further damage. Try taking a cool bath or applying a cool, wet cloth (cool compress) to your skin. This may help with pain.  Drink enough fluid to keep your urine clear or pale yellow.  Try applying aloe vera or a moisturizer that has  soy in it to your sunburn. This may help. Do not apply aloe vera or moisturizer with soy if your sunburn has blisters.  Do not break any blisters that you may have. PREVENTION  Try to avoid the sun between 10:00 a.m. and 2:00 p.m. when it is the strongest.  Apply sunscreen at least 15 minutes before exposure to the sun.  Apply a sunscreen with an SPF of 15 or higher. Consider using an SPF of 30 or higher if you will be exposed to the sun for prolonged periods of time. Use a sunscreen that protects against all of the sun's rays (broad-spectrum) and is water-resistant.  Reapply sunscreen: ? About every two hours during sun exposure. ? More often when sweating a lot while out in the sun. ? After getting wet from swimming or playing in water.  Wear long sleeves, a hat, and sunglasses that block UV light when you are outside.  Talk with your health care provider about medicines, herbs, and foods that can make you more sensitive to light. Avoid these, if possible.  Do not use tanning beds. SEEK MEDICAL CARE IF:  You have a fever.  Your symptoms do not improve with treatment.  Your pain is not controlled with medicine.  Your burn becomes more painful and swollen. SEEK IMMEDIATE MEDICAL CARE IF:  You start to vomit or have diarrhea.  You feel faint or you pass out.  You have a headache and you feel confused.  You develop severe blistering.  You have pus or fluid coming from the blisters. This information is not intended to replace  advice given to you by your health care provider. Make sure you discuss any questions you have with your health care provider. Document Released: 04/07/2005 Document Revised: 10/20/2015 Document Reviewed: 12/30/2014 Elsevier Interactive Patient Education  2017 [employer].

## 2017-12-02 DIAGNOSIS — M79641 Pain in right hand: Secondary | ICD-10-CM | POA: Diagnosis not present

## 2017-12-02 DIAGNOSIS — M79642 Pain in left hand: Secondary | ICD-10-CM | POA: Diagnosis not present

## 2017-12-02 DIAGNOSIS — G5602 Carpal tunnel syndrome, left upper limb: Secondary | ICD-10-CM | POA: Diagnosis not present

## 2017-12-02 DIAGNOSIS — G5601 Carpal tunnel syndrome, right upper limb: Secondary | ICD-10-CM | POA: Diagnosis not present

## 2017-12-12 ENCOUNTER — Ambulatory Visit: Payer: Self-pay | Admitting: Orthopedic Surgery

## 2017-12-12 ENCOUNTER — Encounter: Payer: Self-pay | Admitting: Family Medicine

## 2017-12-12 ENCOUNTER — Ambulatory Visit: Payer: 59 | Admitting: Family Medicine

## 2017-12-12 VITALS — BP 145/76 | HR 64 | Temp 98.1°F | Resp 16 | Wt 191.2 lb

## 2017-12-12 DIAGNOSIS — M5412 Radiculopathy, cervical region: Secondary | ICD-10-CM | POA: Diagnosis not present

## 2017-12-12 DIAGNOSIS — E65 Localized adiposity: Secondary | ICD-10-CM | POA: Insufficient documentation

## 2017-12-12 DIAGNOSIS — G5601 Carpal tunnel syndrome, right upper limb: Secondary | ICD-10-CM | POA: Diagnosis not present

## 2017-12-12 MED ORDER — KETOROLAC TROMETHAMINE 60 MG/2ML IM SOLN
60.0000 mg | Freq: Once | INTRAMUSCULAR | Status: AC
  Administered 2017-12-12: 60 mg via INTRAMUSCULAR

## 2017-12-12 MED ORDER — HYDROCODONE-ACETAMINOPHEN 7.5-325 MG/15ML PO SOLN: 10.0000 mL | Freq: Four times a day (QID) | ORAL | 0 refills | Status: DC | PRN

## 2017-12-12 MED ORDER — GABAPENTIN 100 MG PO CAPS: 100.0000 mg | ORAL_CAPSULE | Freq: Three times a day (TID) | ORAL | 3 refills | Status: DC

## 2017-12-12 MED FILL — GABAPENTIN 100 MG CAPSULE: 100 | 30 days supply | Qty: 90 | Fill #0

## 2017-12-12 MED FILL — HYDROCOD-APAP 7.5-325/15ML: 7.5-325 | 3 days supply | Qty: 120 | Fill #0

## 2017-12-12 NOTE — Assessment & Plan Note (Signed)
Picture in chart Pt to f/u bariatric surgeon -- Dr Hassell Done  No evidence of tinea to treat

## 2017-12-12 NOTE — Patient Instructions (Signed)
Carpal Tunnel Syndrome Carpal tunnel syndrome is a condition that causes pain in your hand and arm. The carpal tunnel is a narrow area located on the palm side of your wrist. Repeated wrist motion or certain diseases may cause swelling within the tunnel. This swelling pinches the main nerve in the wrist (median nerve). What are the causes? This condition may be caused by:  Repeated wrist motions.  Wrist injuries.  Arthritis.  A cyst or tumor in the carpal tunnel.  Fluid buildup during pregnancy.  Sometimes the cause of this condition is not known. What increases the risk? This condition is more likely to develop in:  People who have jobs that cause them to repeatedly move their wrists in the same motion, such as butchers and cashiers.  Women.  People with certain conditions, such as: ? Diabetes. ? Obesity. ? An underactive thyroid (hypothyroidism). ? Kidney failure.  What are the signs or symptoms? Symptoms of this condition include:  A tingling feeling in your fingers, especially in your thumb, index, and middle fingers.  Tingling or numbness in your hand.  An aching feeling in your entire arm, especially when your wrist and elbow are bent for long periods of time.  Wrist pain that goes up your arm to your shoulder.  Pain that goes down into your palm or fingers.  A weak feeling in your hands. You may have trouble grabbing and holding items.  Your symptoms may feel worse during the night. How is this diagnosed? This condition is diagnosed with a medical history and physical exam. You may also have tests, including:  An electromyogram (EMG). This test measures electrical signals sent by your nerves into the muscles.  X-rays.  How is this treated? Treatment for this condition includes:  Lifestyle changes. It is important to stop doing or modify the activity that caused your condition.  Physical or occupational therapy.  Medicines for pain and inflammation.  This may include medicine that is injected into your wrist.  A wrist splint.  Surgery.  Follow these instructions at home: If you have a splint:  Wear it as told by your health care provider. Remove it only as told by your health care provider.  Loosen the splint if your fingers become numb and tingle, or if they turn cold and blue.  Keep the splint clean and dry. General instructions  Take over-the-counter and prescription medicines only as told by your health care provider.  Rest your wrist from any activity that may be causing your pain. If your condition is work related, talk to your employer about changes that can be made, such as getting a wrist pad to use while typing.  If directed, apply ice to the painful area: ? Put ice in a plastic bag. ? Place a towel between your skin and the bag. ? Leave the ice on for 20 minutes, 2-3 times per day.  Keep all follow-up visits as told by your health care provider. This is important.  Do any exercises as told by your health care provider, physical therapist, or occupational therapist. Contact a health care provider if:  You have new symptoms.  Your pain is not controlled with medicines.  Your symptoms get worse. This information is not intended to replace advice given to you by your health care provider. Make sure you discuss any questions you have with your health care provider. Document Released: 06/25/2000 Document Revised: 11/06/2015 Document Reviewed: 11/13/2014 Elsevier Interactive Patient Education  2018 [employer]  

## 2017-12-12 NOTE — Progress Notes (Signed)
Subjective:  I acted as a Education administrator for [employer]. Yancey Flemings, [place]   Patient ID: [name], female    DOB: 1955/10/02, 62 y.o.   MRN: [id number]  Chief Complaint  Patient presents with  . Injections    HPI  Patient is in today for pain in neck / shoulder and wrist.  She is having cts surgery in 2 weeks --- she can not take opiods because it makes her sleepy and no NSAIds po secondary to gastric bypass surgery.  She is requesting a toradol shot because that worked well for a day or so . She is also c/o some skin break down in low ab skin folds.  Her surgeon asked that a picture be taken so he can see it.    Patient Care Team: Carollee Herter, Alferd Apa, DO as PCP - General (Family Medicine) Himmelrich, Bryson Ha, RD (Inactive) as Dietitian   Past Medical History:  Diagnosis Date  . Allergic rhinitis   . Anxiety   . Asthma    hx bronchial asthma at times with upper resp infection  . Depression   . Diabetes mellitus without complication (HCC)    diet controlled no meds  . GERD (gastroesophageal reflux disease)   . Headache(784.0)    migraine and cluster headaches  . Hyperlipemia   . Hypertension   . IBS (irritable bowel syndrome)   . Menopause   . Neuromuscular disorder ([place]) 2009   hx of fibromyalgia, polyarthralsia (surgery induced)  . Stress incontinence    at times  . Tibial fracture 10/14/2016   evulsion periostial right    Past Surgical History:  Procedure Laterality Date  . ABDOMINAL HYSTERECTOMY  12/1999   complete  . CERVICAL CONIZATION W/BX  june 1990   dysplasia of cervix/used 5Fu cream for 3 months  . CERVICAL LAMINECTOMY  2005 & 2009   X 2   NO ROM PROBLEMS  . [place]  . DILATION AND CURETTAGE OF UTERUS   [phone]   missed abortion  . ESOPHAGOGASTRODUODENOSCOPY  06/29/2011   Procedure: ESOPHAGOGASTRODUODENOSCOPY (EGD);  Surgeon: Shann Medal, MD;  Location: [facility];  Service: General;   Laterality: N/A;  . FOOT SURGERY  2005   RT HEEL  . GASTRIC BANDING PORT REVISION  09/24/2011   Procedure: GASTRIC BANDING PORT REVISION;  Surgeon: Pedro Earls, MD;  Location: [facility];  Service: General;  Laterality: N/A;  . GASTRIC ROUX-EN-Y N/A 06/06/2017   Procedure: Conversion from Sleeve to Advance;  Surgeon: Johnathan Hausen, MD;  Location: [facility];  Service: General;  Laterality: N/A;  . HYSTEROSCOPY  1999  . LAPAROSCOPIC GASTRIC BANDING  12/30/09  . LAPAROSCOPIC GASTRIC SLEEVE RESECTION N/A 07/02/2013   Procedure: LAPAROSCOPIC GASTRIC SLEEVE RESECTION upper endoscopy;  Surgeon: Pedro Earls, MD;  Location: [facility];  Service: General;  Laterality: N/A;  . LAPAROSCOPIC REPAIR AND REMOVAL OF GASTRIC BAND N/A 07/02/2013   Procedure: LAPAROSCOPIC REMOVAL OF GASTRIC BAND;  Surgeon: Pedro Earls, MD;  Location: [facility];  Service: General;  Laterality: N/A;  . LAPAROSCOPIC REVISION OF GASTRIC BAND  07/03/2012   Procedure: LAPAROSCOPIC REVISION OF GASTRIC BAND;  Surgeon: Pedro Earls, MD;  Location: [facility];  Service: General;  Laterality: N/A;  removal of old lap. band port, replaced with AP standard band  . LAPAROSCOPY  09/24/2011   Procedure: LAPAROSCOPY DIAGNOSTIC;  Surgeon: Rodman Key  Verdie Drown, MD;  Location: [facility];  Service: General;  Laterality: N/A;  . LAPAROSCOPY  07/03/2012   Procedure: LAPAROSCOPY DIAGNOSTIC;  Surgeon: Pedro Earls, MD;  Location: [facility];  Service: General;  Laterality: N/A;  Exploratory Laparoscopy   . MESH APPLIED TO LAP PORT  07/03/2012   Procedure: MESH APPLIED TO LAP PORT;  Surgeon: Pedro Earls, MD;  Location: [facility];  Service: General;  Laterality: N/A;  . [place]   X 2  . right knee  1981   ARTHROSCOPY AND ARTHROTOMY  . right knee arthroscopy and arthrotomy  12-1979  . TONSILLECTOMY  1971  . TUBAL LIGATION  1993   WITH C -SECTION    Family History  Problem  Relation Age of Onset  . Diabetes Mother   . Stroke Mother   . Breast cancer Mother   . Atrial fibrillation Mother   . Hypertension Mother   . Cancer Mother        breast  . Diabetes Father   . Stroke Father   . Hypertension Father   . Heart attack Father   . Thyroid disease Unknown   . Heart disease Unknown   . COPD Other     Social History   Socioeconomic History  . Marital status: Married    Spouse name: Not on file  . Number of children: Not on file  . Years of education: Not on file  . Highest education level: Not on file  Occupational History  . Occupation: Programmer, multimedia: [employer]  Social Needs  . Financial resource strain: Not on file  . Food insecurity:    Worry: Not on file    Inability: Not on file  . Transportation needs:    Medical: Not on file    Non-medical: Not on file  Tobacco Use  . Smoking status: Never Smoker  . Smokeless tobacco: Never Used  Substance and Sexual Activity  . Alcohol use: No    Alcohol/week: 0.0 oz  . Drug use: No  . Sexual activity: Yes    Birth control/protection: None  Lifestyle  . Physical activity:    Days per week: Not on file    Minutes per session: Not on file  . Stress: Not on file  Relationships  . Social connections:    Talks on phone: Not on file    Gets together: Not on file    Attends religious service: Not on file    Active member of club or organization: Not on file    Attends meetings of clubs or organizations: Not on file    Relationship status: Not on file  . Intimate partner violence:    Fear of current or ex partner: Not on file    Emotionally abused: Not on file    Physically abused: Not on file    Forced sexual activity: Not on file  Other Topics Concern  . Not on file  Social History Narrative  . Not on file    Outpatient Medications Prior to Visit  Medication Sig Dispense Refill  . Blood Glucose Monitoring Suppl (BAYER CONTOUR LINK MONITOR) W/DEVICE KIT Test blood sugar  once daily DX code: E11.9 1 kit 1  . Ca Phosphate-Cholecalciferol (CALTRATE GUMMY BITES PO) Take 2 each 2 (two) times daily by mouth.     . citalopram (CELEXA) 20 MG tablet TAKE 1 TABLET (20 MG TOTAL) BY MOUTH EVERY EVENING. 90 tablet 3  .  cyclobenzaprine (FLEXERIL) 10 MG tablet Take 1 tablet (10 mg total) by mouth 3 (three) times daily as needed for muscle spasms. 30 tablet 0  . estradiol (VIVELLE-DOT) 0.1 MG/24HR Place 1 patch (0.1 mg total) onto the skin 2 (two) times a week. Monday and Thursday 8 patch 11  . furosemide (LASIX) 20 MG tablet TAKE 1 TABLET BY MOUTH EVERY MORNING 90 tablet 1  . glucose blood (ONETOUCH VERIO) test strip Check blood sugar daily 100 each 12  . glucose blood test strip Test blood sugar once daily. Dx Code: E11.9 100 each 12  . HYDROcodone-acetaminophen (NORCO/VICODIN) 5-325 MG tablet Take 1 tablet by mouth every 6 (six) hours as needed for moderate pain. 30 tablet 0  . loratadine (CLARITIN) 10 MG tablet Take 10 mg by mouth every evening.     . methocarbamol (ROBAXIN) 500 MG tablet Take 1 tablet (500 mg total) by mouth 4 (four) times daily. 10 tablet 1  . olmesartan (BENICAR) 5 MG tablet Take 1 tablet (5 mg total) by mouth daily. 90 tablet 3  . ONETOUCH DELICA LANCETS FINE MISC check blood sugar daily 100 each 12  . Pediatric Multivit-Minerals-C (ONE-A-DAY SCOOBY-DOO GUMMIES PO) Take 1 tablet 2 (two) times daily by mouth.     . potassium chloride (K-DUR,KLOR-CON) 10 MEQ tablet TAKE 1 TABLET (10 MEQ TOTAL) BY MOUTH DAILY. 90 tablet 1  . PREMARIN vaginal cream Apply 1 application topically 2 (two) times a week.  1  . rosuvastatin (CRESTOR) 20 MG tablet TAKE 1 TABLET (20 MG TOTAL) BY MOUTH AT BEDTIME. 90 tablet 1  . silver sulfADIAZINE (SILVADENE) 1 % cream Apply 1 application topically daily. 400 g 0  . thiamine (VITAMIN B-1) 100 MG tablet Take 100 mg daily by mouth.    . traZODone (DESYREL) 50 MG tablet Take 0.5-1 tablets (25-50 mg total) by mouth at bedtime as needed  for sleep. 30 tablet 3  . metoCLOPramide (REGLAN) 10 MG tablet Take 10 mg by mouth at bedtime.    . pantoprazole (PROTONIX) 40 MG tablet Take 1 tablet (40 mg total) by mouth daily. (Patient taking differently: Take 40 mg at bedtime by mouth. ) 90 tablet 3  . ranitidine (ZANTAC) 150 MG tablet Take 1 tablet (150 mg total) by mouth 2 (two) times daily. 180 tablet 3  . sucralfate (CARAFATE) 1 GM/10ML suspension Take 1 g at bedtime by mouth.     No facility-administered medications prior to visit.     Allergies  Allergen Reactions  . Isoniazid     Severe SOB  . Nitrofurantoin Shortness Of Breath    REACTION: whelps  . Hctz [Hydrochlorothiazide]     rash  . Promethazine Hcl [Promethazine Hcl]     IF GIVEN  IV-hallucinations     CAN TAKE PO PHENERGAN  . Tamiflu [Oseltamivir Phosphate] Nausea And Vomiting    "I vomited within 30 minutes of taking it and was told I cannot ever take it again."  . Macrolides And Ketolides     rash  . Morphine     REACTION: severe vomiting  . Percocet [Oxycodone-Acetaminophen]     REACTION: severe itching. Can take by mouth codeine  . Roxicet [Oxycodone-Acetaminophen]     itching  . Sulfamethoxazole-Trimethoprim     Septra / Bactrim  --REACTION: rash  . Fentanyl Hives and Rash    REACTION: whelps    Review of Systems  Constitutional: Negative for chills, fever and malaise/fatigue.  HENT: Negative for congestion and hearing loss.  Eyes: Negative for discharge.  Respiratory: Negative for cough, sputum production and shortness of breath.   Cardiovascular: Negative for chest pain, palpitations and leg swelling.  Gastrointestinal: Negative for abdominal pain, blood in stool, constipation, diarrhea, heartburn, nausea and vomiting.  Genitourinary: Negative for dysuria, frequency, hematuria and urgency.  Musculoskeletal: Positive for joint pain and neck pain. Negative for back pain, falls and myalgias.  Skin: Negative for rash.  Neurological: Negative for  dizziness, sensory change, loss of consciousness, weakness and headaches.  Endo/Heme/Allergies: Negative for environmental allergies. Does not bruise/bleed easily.  Psychiatric/Behavioral: Negative for depression and suicidal ideas. The patient is not nervous/anxious and does not have insomnia.        Objective:    Physical Exam  Constitutional: She is oriented to person, place, and time. She appears well-developed and well-nourished.  HENT:  Head: Normocephalic and atraumatic.  Eyes: Conjunctivae and EOM are normal.  Neck: Normal range of motion. Neck supple. No JVD present. Carotid bruit is not present. No thyromegaly present.  Cardiovascular: Normal rate, regular rhythm and normal heart sounds.  No murmur heard. Pulmonary/Chest: Effort normal and breath sounds normal. No respiratory distress. She has no wheezes. She has no rales. She exhibits no tenderness.  Musculoskeletal: She exhibits tenderness. She exhibits no edema.  Neurological: She is alert and oriented to person, place, and time.  Psychiatric: She has a normal mood and affect.  Nursing note and vitals reviewed.   BP (!) 145/76 (BP Location: Left Arm, Patient Position: Sitting, Cuff Size: Normal)   Pulse 64   Temp 98.1 F (36.7 C) (Oral)   Resp 16   Wt 191 lb 3.2 oz (86.7 kg)   SpO2 100%   BMI 26.67 kg/m  Wt Readings from Last 3 Encounters:  12/12/17 191 lb 3.2 oz (86.7 kg)  12/01/17 194 lb 12.8 oz (88.4 kg)  11/03/17 198 lb 12.8 oz (90.2 kg)   BP Readings from Last 3 Encounters:  12/12/17 (!) 145/76  12/01/17 122/64  11/03/17 118/60    Mild amount of errythema in skin fold No sign of infection    Immunization History  Administered Date(s) Administered  . H1N1 07/23/2008  . Influenza Whole 04/24/2008  . Influenza,inj,Quad PF,6+ Mos 04/11/2013  . Influenza-Unspecified 04/25/2014, 03/13/2015, 03/12/2016  . Pneumococcal Polysaccharide-23 07/04/2013  . Td 01/02/2009  . Zoster Recombinat (Shingrix)  03/21/2017, 05/25/2017    Health Maintenance  Topic Date Due  . COLONOSCOPY  01/04/2017  . INFLUENZA VACCINE  02/09/2018  . FOOT EXAM  03/07/2018  . OPHTHALMOLOGY EXAM  03/09/2018  . HEMOGLOBIN A1C  04/08/2018  . MAMMOGRAM  06/16/2018  . PNEUMOCOCCAL POLYSACCHARIDE VACCINE (2) 07/04/2018  . TETANUS/TDAP  01/03/2019  . PAP SMEAR  09/07/2019  . Hepatitis C Screening  Completed  . HIV Screening  Completed    Lab Results  Component Value Date   WBC 15.4 (H) 06/07/2017   HGB 11.8 (L) 06/07/2017   HCT 36.1 06/07/2017   PLT 206 06/07/2017   GLUCOSE 122 (H) 10/06/2017   CHOL 162 10/06/2017   TRIG 91.0 10/06/2017   HDL 59.50 10/06/2017   LDLCALC 84 10/06/2017   ALT 14 10/06/2017   AST 14 10/06/2017   NA 139 10/06/2017   K 4.1 10/06/2017   CL 100 10/06/2017   CREATININE 0.72 10/06/2017   BUN 11 10/06/2017   CO2 33 (H) 10/06/2017   TSH 1.87 12/24/2013   HGBA1C 5.8 10/06/2017   MICROALBUR 0.1 08/01/2014    Lab Results  Component Value  Date   TSH 1.87 12/24/2013   Lab Results  Component Value Date   WBC 15.4 (H) 06/07/2017   HGB 11.8 (L) 06/07/2017   HCT 36.1 06/07/2017   MCV 86.8 06/07/2017   PLT 206 06/07/2017   Lab Results  Component Value Date   NA 139 10/06/2017   K 4.1 10/06/2017   CO2 33 (H) 10/06/2017   GLUCOSE 122 (H) 10/06/2017   BUN 11 10/06/2017   CREATININE 0.72 10/06/2017   BILITOT 0.6 10/06/2017   ALKPHOS 67 10/06/2017   AST 14 10/06/2017   ALT 14 10/06/2017   PROT 7.8 10/06/2017   ALBUMIN 4.1 10/06/2017   CALCIUM 9.6 10/06/2017   ANIONGAP 9 05/31/2017   GFR 87.60 10/06/2017   Lab Results  Component Value Date   CHOL 162 10/06/2017   Lab Results  Component Value Date   HDL 59.50 10/06/2017   Lab Results  Component Value Date   LDLCALC 84 10/06/2017   Lab Results  Component Value Date   TRIG 91.0 10/06/2017   Lab Results  Component Value Date   CHOLHDL 3 10/06/2017   Lab Results  Component Value Date   HGBA1C 5.8 10/06/2017           Assessment & Plan:   Problem List Items Addressed This Visit      Unprioritized   Cervical radiculopathy   Relevant Medications   gabapentin (NEURONTIN) 100 MG capsule   ketorolac (TORADOL) injection 60 mg (Completed)   Symptomatic abdominal panniculus    Picture in chart Pt to f/u bariatric surgeon -- Dr Hassell Done  No evidence of tinea to treat        Other Visit Diagnoses    Carpal tunnel syndrome of right wrist    -  Primary   Relevant Medications   gabapentin (NEURONTIN) 100 MG capsule   HYDROcodone-acetaminophen (HYCET) 7.5-325 mg/15 ml solution      I have discontinued [name]'s metoCLOPramide, pantoprazole, ranitidine, and sucralfate. I am also having her start on gabapentin and HYDROcodone-acetaminophen. Additionally, I am having her maintain her loratadine, Ca Phosphate-Cholecalciferol (CALTRATE GUMMY BITES PO), Pediatric Multivit-Minerals-C (ONE-A-DAY SCOOBY-DOO GUMMIES PO), estradiol, ONETOUCH DELICA LANCETS FINE, BAYER CONTOUR LINK MONITOR, glucose blood, PREMARIN, glucose blood, olmesartan, potassium chloride, rosuvastatin, citalopram, thiamine, methocarbamol, cyclobenzaprine, HYDROcodone-acetaminophen, traZODone, furosemide, and silver sulfADIAZINE. We administered ketorolac.  Meds ordered this encounter  Medications  . gabapentin (NEURONTIN) 100 MG capsule    Sig: Take 1 capsule (100 mg total) by mouth 3 (three) times daily.    Dispense:  90 capsule    Refill:  3  . HYDROcodone-acetaminophen (HYCET) 7.5-325 mg/15 ml solution    Sig: Take 10 mLs by mouth 4 (four) times daily as needed for moderate pain.    Dispense:  120 mL    Refill:  0  . ketorolac (TORADOL) injection 60 mg    CMA served as scribe during this visit. History, Physical and Plan performed by medical provider. Documentation and orders reviewed and attested to.  [name], DO

## 2017-12-14 MED FILL — METHOCARBAMOL 500 MG TABLET: 500 | 13 days supply | Qty: 40 | Fill #1

## 2017-12-15 ENCOUNTER — Ambulatory Visit (INDEPENDENT_AMBULATORY_CARE_PROVIDER_SITE_OTHER): Payer: 59 | Admitting: Family Medicine

## 2017-12-15 ENCOUNTER — Ambulatory Visit: Payer: 59 | Admitting: Family Medicine

## 2017-12-15 DIAGNOSIS — M5412 Radiculopathy, cervical region: Secondary | ICD-10-CM | POA: Diagnosis not present

## 2017-12-15 MED ORDER — KETOROLAC TROMETHAMINE 60 MG/2ML IM SOLN
60.0000 mg | Freq: Once | INTRAMUSCULAR | Status: AC
  Administered 2017-12-15: 60 mg via INTRAMUSCULAR

## 2017-12-15 NOTE — Progress Notes (Signed)
toradol shot given    [name], DO

## 2017-12-23 ENCOUNTER — Encounter (HOSPITAL_COMMUNITY): Payer: Self-pay

## 2017-12-23 NOTE — Pre-Procedure Instructions (Signed)
[name]  12/23/2017      Your procedure is scheduled on December 27, 2017.  Report to [facility] Admitting at 1:00 PM  Call this number if you have problems the morning of surgery:  [phone]   Remember:  Do not eat or drink after midnight.   Continue all other medications as directed by your physician except for following these medication instructions before surgery.    Take these medicines the morning of surgery with A SIP OF WATER : Citalopram (Celexa) Cyclobenzaprine (Flexeril) if needed Pain pill if needed  7 days prior to surgery STOP taking any Aspirin (unless otherwise instructed by your surgeon), Aleve, Naproxen, Ibuprofen, Motrin, Advil, Goody's, BC's, all herbal medications, fish oil, and all vitamins.     How to Manage Your Diabetes Before and After Surgery  Why is it important to control my blood sugar before and after surgery? . Improving blood sugar levels before and after surgery helps healing and can limit problems. . A way of improving blood sugar control is eating a healthy diet by: o  Eating less sugar and carbohydrates o  Increasing activity/exercise o  Talking with your doctor about reaching your blood sugar goals . High blood sugars (greater than 180 mg/dL) can raise your risk of infections and slow your recovery, so you will need to focus on controlling your diabetes during the weeks before surgery. . Make sure that the doctor who takes care of your diabetes knows about your planned surgery including the date and location.  How do I manage my blood sugar before surgery? . Check your blood sugar at least 4 times a day, starting 2 days before surgery, to make sure that the level is not too high or low. o Check your blood sugar the morning of your surgery when you wake up and every 2 hours until you get to the Short Stay unit. . If your blood sugar is less than 70 mg/dL, you will need to treat for low blood sugar: o Do not take  insulin. o Treat a low blood sugar (less than 70 mg/dL) with  cup of clear juice (cranberry or apple), 4 glucose tablets, OR glucose gel. Recheck blood sugar in 15 minutes after treatment (to make sure it is greater than 70 mg/dL). If your blood sugar is not greater than 70 mg/dL on recheck, call [phone] o  for further instructions. . Report your blood sugar to the short stay nurse when you get to Short Stay.  . If you are admitted to the hospital after surgery: o Your blood sugar will be checked by the staff and you will probably be given insulin after surgery (instead of oral diabetes medicines) to make sure you have good blood sugar levels. o The goal for blood sugar control after surgery is 80-180 mg/dL.       Do not wear jewelry, make-up or nail polish.  Do not wear lotions, powders, or perfumes, or deodorant.  Do not shave 48 hours prior to surgery.   Do not bring valuables to the hospital.  [facility] is not responsible for any belongings or valuables.  Contacts, dentures or bridgework may not be worn into surgery.  Leave your suitcase in the car.  After surgery it may be brought to your room.  For patients admitted to the hospital, discharge time will be determined by your treatment team.  Patients discharged the day of surgery will not be allowed to drive home.  Special instructions:   [facility]- Preparing For Surgery  Before surgery, you can play an important role. Because skin is not sterile, your skin needs to be as free of germs as possible. You can reduce the number of germs on your skin by washing with CHG (chlorahexidine gluconate) Soap before surgery.  CHG is an antiseptic cleaner which kills germs and bonds with the skin to continue killing germs even after washing.    Oral Hygiene is also important to reduce your risk of infection.  Remember - BRUSH YOUR TEETH THE MORNING OF SURGERY WITH YOUR REGULAR TOOTHPASTE  Please do not use if you have an allergy to  CHG or antibacterial soaps. If your skin becomes reddened/irritated stop using the CHG.  Do not shave (including legs and underarms) for at least 48 hours prior to first CHG shower. It is OK to shave your face.  Please follow these instructions carefully.   1. Shower the NIGHT BEFORE SURGERY and the MORNING OF SURGERY with CHG.   2. If you chose to wash your hair, wash your hair first as usual with your normal shampoo.  3. After you shampoo, rinse your hair and body thoroughly to remove the shampoo.  4. Use CHG as you would any other liquid soap. You can apply CHG directly to the skin and wash gently with a scrungie or a clean washcloth.   5. Apply the CHG Soap to your body ONLY FROM THE NECK DOWN.  Do not use on open wounds or open sores. Avoid contact with your eyes, ears, mouth and genitals (private parts). Wash Face and genitals (private parts)  with your normal soap.  6. Wash thoroughly, paying special attention to the area where your surgery will be performed.  7. Thoroughly rinse your body with warm water from the neck down.  8. DO NOT shower/wash with your normal soap after using and rinsing off the CHG Soap.  9. Pat yourself dry with a CLEAN TOWEL.  10. Wear CLEAN PAJAMAS to bed the night before surgery, wear comfortable clothes the morning of surgery  11. Place CLEAN SHEETS on your bed the night of your first shower and DO NOT SLEEP WITH PETS.    Day of Surgery:  Do not apply any deodorants/lotions.  Please wear clean clothes to the hospital/surgery center.   Remember to brush your teeth WITH YOUR REGULAR TOOTHPASTE.    Please read over the following fact sheets that you were given.

## 2017-12-24 DIAGNOSIS — M25511 Pain in right shoulder: Secondary | ICD-10-CM | POA: Diagnosis not present

## 2017-12-24 DIAGNOSIS — M25512 Pain in left shoulder: Secondary | ICD-10-CM | POA: Diagnosis not present

## 2017-12-26 ENCOUNTER — Other Ambulatory Visit: Payer: Self-pay

## 2017-12-26 ENCOUNTER — Encounter (HOSPITAL_COMMUNITY)
Admission: RE | Admit: 2017-12-26 | Discharge: 2017-12-26 | Disposition: A | Discharge status: Home / Self Care | Payer: 59 | Source: Ambulatory Visit | Attending: Orthopedic Surgery | Admitting: Orthopedic Surgery

## 2017-12-26 ENCOUNTER — Encounter (HOSPITAL_COMMUNITY): Payer: Self-pay

## 2017-12-26 DIAGNOSIS — G5601 Carpal tunnel syndrome, right upper limb: Secondary | ICD-10-CM | POA: Diagnosis not present

## 2017-12-26 DIAGNOSIS — E119 Type 2 diabetes mellitus without complications: Secondary | ICD-10-CM | POA: Diagnosis not present

## 2017-12-26 DIAGNOSIS — F329 Major depressive disorder, single episode, unspecified: Secondary | ICD-10-CM | POA: Diagnosis not present

## 2017-12-26 DIAGNOSIS — Z79899 Other long term (current) drug therapy: Secondary | ICD-10-CM | POA: Diagnosis not present

## 2017-12-26 DIAGNOSIS — M24641 Ankylosis, right hand: Secondary | ICD-10-CM | POA: Diagnosis not present

## 2017-12-26 DIAGNOSIS — E785 Hyperlipidemia, unspecified: Secondary | ICD-10-CM | POA: Diagnosis not present

## 2017-12-26 DIAGNOSIS — Z9884 Bariatric surgery status: Secondary | ICD-10-CM | POA: Diagnosis not present

## 2017-12-26 DIAGNOSIS — I10 Essential (primary) hypertension: Secondary | ICD-10-CM | POA: Diagnosis not present

## 2017-12-26 DIAGNOSIS — Z79890 Hormone replacement therapy (postmenopausal): Secondary | ICD-10-CM | POA: Diagnosis not present

## 2017-12-26 LAB — CBC
HCT: 42.6 % (ref 36.0–46.0)
Hemoglobin: 13.4 g/dL (ref 12.0–15.0)
MCH: 26.8 pg (ref 26.0–34.0)
MCHC: 31.5 g/dL (ref 30.0–36.0)
MCV: 85.2 fL (ref 78.0–100.0)
Platelets: 342 10*3/uL (ref 150–400)
RBC: 5 MIL/uL (ref 3.87–5.11)
RDW: 13.2 % (ref 11.5–15.5)
WBC: 12.9 10*3/uL — ABNORMAL HIGH (ref 4.0–10.5)

## 2017-12-26 LAB — BASIC METABOLIC PANEL
Anion gap: 8 (ref 5–15)
BUN: 13 mg/dL (ref 6–20)
CO2: 28 mmol/L (ref 22–32)
Calcium: 9.3 mg/dL (ref 8.9–10.3)
Chloride: 100 mmol/L — ABNORMAL LOW (ref 101–111)
Creatinine, Ser: 0.83 mg/dL (ref 0.44–1.00)
GFR calc Af Amer: >60 mL/min (ref >60)
GFR calc non Af Amer: >60 mL/min (ref >60)
Glucose, Bld: 93 mg/dL (ref 65–99)
Potassium: 3.9 mmol/L (ref 3.5–5.1)
Sodium: 136 mmol/L (ref 135–145)

## 2017-12-26 LAB — HEMOGLOBIN A1C
Hgb A1c MFr Bld: 5.8 % — ABNORMAL HIGH (ref 4.8–5.6)
Mean Plasma Glucose: 119.76 mg/dL

## 2017-12-26 NOTE — Progress Notes (Signed)
PCP is Dr. Floy Sabina LOV 12/2017  (she manages her diabetes as well) She states that back in 2005, had gone to PCP with swelling in leg.  Then was sent to Dr. Johnsie Cancel.  Did have stress & echo - non cardiac in nature and has not been back since and no problems. Checks her sugars 2-3 x a time   Ranges from 85-110, she takes no meds.

## 2017-12-26 NOTE — Pre-Procedure Instructions (Signed)
[name]  12/26/2017      Your procedure is scheduled on  Tuesday, December 27, 2017.   Report to [facility] Admitting at 1:00 PM             (posted surgery time 3p - 4p)   Call this number if you have problems the morning of surgery:  [phone]   Remember:  Do not eat any food or drink any liquids after midnight, tonight   Continue all other medications as directed by your physician except for following these medication instructions before surgery.    Take these medicines the morning of surgery with A SIP OF WATER : Citalopram (Celexa) Cyclobenzaprine (Flexeril) if needed Pain pill if needed  7 days prior to surgery STOP taking any Aspirin (unless otherwise instructed by your surgeon), Aleve, Naproxen, Ibuprofen, Motrin, Advil, Goody's, BC's, all herbal medications, fish oil, and all vitamins.     How to Manage Your Diabetes Before and After Surgery  Why is it important to control my blood sugar before and after surgery? . Improving blood sugar levels before and after surgery helps healing and can limit problems. . A way of improving blood sugar control is eating a healthy diet by: o  Eating less sugar and carbohydrates o  Increasing activity/exercise o  Talking with your doctor about reaching your blood sugar goals . High blood sugars (greater than 180 mg/dL) can raise your risk of infections and slow your recovery, so you will need to focus on controlling your diabetes during the weeks before surgery. . Make sure that the doctor who takes care of your diabetes knows about your planned surgery including the date and location.  How do I manage my blood sugar before surgery? . Check your blood sugar at least 4 times a day, starting 2 days before surgery, to make sure that the level is not too high or low. o Check your blood sugar the morning of your surgery when you wake up and every 2 hours until you get to the Short Stay unit. . If your blood sugar  is less than 70 mg/dL, you will need to treat for low blood sugar: o Do not take insulin. o  o Treat a low blood sugar (less than 70 mg/dL) with  cup of clear juice (cranberry or apple), 4 glucose tablets, OR glucose gel. o  Recheck blood sugar in 15 minutes after treatment (to make sure it is greater than 70 mg/dL). If your blood sugar is not greater than 70 mg/dL on recheck, call [phone] o  for further instructions. . Report your blood sugar to the short stay nurse when you get to Short Stay.  . If you are admitted to the hospital after surgery: o Your blood sugar will be checked by the staff and you will probably be given insulin after surgery (instead of oral diabetes medicines) to make sure you have good blood sugar levels. o The goal for blood sugar control after surgery is 80-180 mg/dL.     Do not wear jewelry, make-up or nail polish.  Do not wear lotions, powders, or perfumes, or deodorant.  Do not shave 48 hours prior to surgery.   Do not bring valuables to the hospital.  [facility] is not responsible for any belongings or valuables.  Contacts, dentures or bridgework may not be worn into surgery.  Leave your suitcase in the car.  After surgery it may be brought to your room.  For  patients admitted to the hospital, discharge time will be determined by your treatment team.  Patients discharged the day of surgery will not be allowed to drive home.   Special instructions:   [facility]- Preparing For Surgery  Before surgery, you can play an important role. Because skin is not sterile, your skin needs to be as free of germs as possible. You can reduce the number of germs on your skin by washing with CHG (chlorahexidine gluconate) Soap before surgery.  CHG is an antiseptic cleaner which kills germs and bonds with the skin to continue killing germs even after washing.    Oral Hygiene is also important to reduce your risk of infection.  Remember - BRUSH YOUR TEETH THE MORNING  OF SURGERY WITH YOUR REGULAR TOOTHPASTE  Please do not use if you have an allergy to CHG or antibacterial soaps. If your skin becomes reddened/irritated stop using the CHG.  Do not shave (including legs and underarms) for at least 48 hours prior to first CHG shower. It is OK to shave your face.  Please follow these instructions carefully.   1. Shower the NIGHT BEFORE SURGERY and the MORNING OF SURGERY with CHG.   2. If you chose to wash your hair, wash your hair first as usual with your normal shampoo.  3. After you shampoo, rinse your hair and body thoroughly to remove the shampoo.  4. Use CHG as you would any other liquid soap. You can apply CHG directly to the skin and wash gently with a scrungie or a clean washcloth.   5. Apply the CHG Soap to your body ONLY FROM THE NECK DOWN.  Do not use on open wounds or open sores. Avoid contact with your eyes, ears, mouth and genitals (private parts). Wash Face and genitals (private parts)  with your normal soap.  6. Wash thoroughly, paying special attention to the area where your surgery will be performed.  7. Thoroughly rinse your body with warm water from the neck down.  8. DO NOT shower/wash with your normal soap after using and rinsing off the CHG Soap.  9. Pat yourself dry with a CLEAN TOWEL.  10. Wear CLEAN PAJAMAS to bed the night before surgery, wear comfortable clothes the morning of surgery  11. Place CLEAN SHEETS on your bed the night of your first shower and DO NOT SLEEP WITH PETS.    Day of Surgery:  Do not apply any deodorants/lotions.  Please wear clean clothes to the hospital/surgery center.   Remember to brush your teeth WITH YOUR REGULAR TOOTHPASTE.    Please read over the following fact sheets that you were given.

## 2017-12-27 ENCOUNTER — Ambulatory Visit (HOSPITAL_COMMUNITY): Payer: 59 | Admitting: Certified Registered Nurse Anesthetist

## 2017-12-27 ENCOUNTER — Encounter (HOSPITAL_COMMUNITY)
Admission: RE | Disposition: A | Discharge status: Home / Self Care | Payer: Self-pay | Source: Ambulatory Visit | Attending: Orthopedic Surgery

## 2017-12-27 ENCOUNTER — Ambulatory Visit (HOSPITAL_COMMUNITY)
Admission: RE | Admit: 2017-12-27 | Discharge: 2017-12-27 | Disposition: A | Discharge status: Home / Self Care | Payer: 59 | Source: Ambulatory Visit | Attending: Orthopedic Surgery | Admitting: Orthopedic Surgery

## 2017-12-27 ENCOUNTER — Encounter (HOSPITAL_COMMUNITY): Payer: Self-pay

## 2017-12-27 DIAGNOSIS — Z9884 Bariatric surgery status: Secondary | ICD-10-CM | POA: Diagnosis not present

## 2017-12-27 DIAGNOSIS — F329 Major depressive disorder, single episode, unspecified: Secondary | ICD-10-CM | POA: Insufficient documentation

## 2017-12-27 DIAGNOSIS — M24641 Ankylosis, right hand: Secondary | ICD-10-CM | POA: Insufficient documentation

## 2017-12-27 DIAGNOSIS — E119 Type 2 diabetes mellitus without complications: Secondary | ICD-10-CM | POA: Insufficient documentation

## 2017-12-27 DIAGNOSIS — I10 Essential (primary) hypertension: Secondary | ICD-10-CM | POA: Diagnosis not present

## 2017-12-27 DIAGNOSIS — Z79899 Other long term (current) drug therapy: Secondary | ICD-10-CM | POA: Diagnosis not present

## 2017-12-27 DIAGNOSIS — E1151 Type 2 diabetes mellitus with diabetic peripheral angiopathy without gangrene: Secondary | ICD-10-CM | POA: Diagnosis not present

## 2017-12-27 DIAGNOSIS — G5601 Carpal tunnel syndrome, right upper limb: Secondary | ICD-10-CM | POA: Insufficient documentation

## 2017-12-27 DIAGNOSIS — Z79890 Hormone replacement therapy (postmenopausal): Secondary | ICD-10-CM | POA: Diagnosis not present

## 2017-12-27 DIAGNOSIS — E785 Hyperlipidemia, unspecified: Secondary | ICD-10-CM | POA: Diagnosis not present

## 2017-12-27 DIAGNOSIS — K219 Gastro-esophageal reflux disease without esophagitis: Secondary | ICD-10-CM | POA: Diagnosis not present

## 2017-12-27 HISTORY — PX: CARPAL TUNNEL RELEASE: SHX101

## 2017-12-27 LAB — GLUCOSE, CAPILLARY
Glucose-Capillary: 107 mg/dL — ABNORMAL HIGH (ref 65–99)
Glucose-Capillary: 103 mg/dL — ABNORMAL HIGH (ref 65–99)

## 2017-12-27 SURGERY — CARPAL TUNNEL RELEASE
Anesthesia: Monitor Anesthesia Care | Site: Wrist | Laterality: Right

## 2017-12-27 MED ORDER — ONDANSETRON HCL 4 MG/2ML IJ SOLN
INTRAMUSCULAR | Status: AC
  Filled 2017-12-27: qty 6

## 2017-12-27 MED ORDER — CEFAZOLIN SODIUM-DEXTROSE 2-4 GM/100ML-% IV SOLN
2.0000 g | INTRAVENOUS | Status: AC
  Administered 2017-12-27: 2 g via INTRAVENOUS
  Filled 2017-12-27: qty 100

## 2017-12-27 MED ORDER — ACETAMINOPHEN 500 MG PO TABS
ORAL_TABLET | ORAL | Status: AC
  Filled 2017-12-27: qty 2

## 2017-12-27 MED ORDER — BUPIVACAINE HCL (PF) 0.25 % IJ SOLN
INTRAMUSCULAR | Status: AC
  Filled 2017-12-27: qty 30

## 2017-12-27 MED ORDER — FENTANYL CITRATE (PF) 250 MCG/5ML IJ SOLN
INTRAMUSCULAR | Status: AC
  Filled 2017-12-27: qty 5

## 2017-12-27 MED ORDER — BUPIVACAINE HCL (PF) 0.25 % IJ SOLN
INTRAMUSCULAR | Status: DC | PRN
  Administered 2017-12-27: 30 mL

## 2017-12-27 MED ORDER — ONDANSETRON HCL 4 MG/2ML IJ SOLN
INTRAMUSCULAR | Status: DC | PRN
  Administered 2017-12-27: 4 mg via INTRAVENOUS

## 2017-12-27 MED ORDER — ACETAMINOPHEN 500 MG PO TABS
1000.0000 mg | ORAL_TABLET | Freq: Once | ORAL | Status: AC
  Administered 2017-12-27: 1000 mg via ORAL

## 2017-12-27 MED ORDER — FENTANYL CITRATE (PF) 100 MCG/2ML IJ SOLN
INTRAMUSCULAR | Status: DC | PRN
  Administered 2017-12-27: 25 ug via INTRAVENOUS
  Administered 2017-12-27: 50 ug via INTRAVENOUS

## 2017-12-27 MED ORDER — CHLORHEXIDINE GLUCONATE 4 % EX LIQD: 60.0000 mL | Freq: Once | CUTANEOUS | Status: DC

## 2017-12-27 MED ORDER — PHENYLEPHRINE 40 MCG/ML (10ML) SYRINGE FOR IV PUSH (FOR BLOOD PRESSURE SUPPORT)
PREFILLED_SYRINGE | INTRAVENOUS | Status: AC
  Filled 2017-12-27: qty 30

## 2017-12-27 MED ORDER — MIDAZOLAM HCL 2 MG/2ML IJ SOLN
INTRAMUSCULAR | Status: AC
  Filled 2017-12-27: qty 2

## 2017-12-27 MED ORDER — GABAPENTIN 300 MG PO CAPS
ORAL_CAPSULE | ORAL | Status: AC
  Filled 2017-12-27: qty 1

## 2017-12-27 MED ORDER — DEXAMETHASONE SODIUM PHOSPHATE 10 MG/ML IJ SOLN
INTRAMUSCULAR | Status: AC
  Filled 2017-12-27: qty 1

## 2017-12-27 MED ORDER — LIDOCAINE HCL (PF) 1 % IJ SOLN
INTRAMUSCULAR | Status: AC
  Filled 2017-12-27: qty 30

## 2017-12-27 MED ORDER — MIDAZOLAM HCL 2 MG/2ML IJ SOLN
INTRAMUSCULAR | Status: DC | PRN
  Administered 2017-12-27: 2 mg via INTRAVENOUS

## 2017-12-27 MED ORDER — 0.9 % SODIUM CHLORIDE (POUR BTL) OPTIME
TOPICAL | Status: DC | PRN
  Administered 2017-12-27: 1000 mL

## 2017-12-27 MED ORDER — LACTATED RINGERS IV SOLN
INTRAVENOUS | Status: DC
  Administered 2017-12-27: 14:00:00 via INTRAVENOUS

## 2017-12-27 MED ORDER — SODIUM CHLORIDE 0.9 % IJ SOLN
INTRAMUSCULAR | Status: AC
  Filled 2017-12-27: qty 10

## 2017-12-27 MED ORDER — LIDOCAINE 2% (20 MG/ML) 5 ML SYRINGE
INTRAMUSCULAR | Status: AC
  Filled 2017-12-27: qty 15

## 2017-12-27 MED ORDER — LIDOCAINE HCL 1 % IJ SOLN
INTRAMUSCULAR | Status: DC | PRN
  Administered 2017-12-27: 30 mL

## 2017-12-27 MED ORDER — HYDROMORPHONE HCL 2 MG/ML IJ SOLN: 0.2500 mg | INTRAMUSCULAR | Status: DC | PRN

## 2017-12-27 MED ORDER — GABAPENTIN 300 MG PO CAPS
300.0000 mg | ORAL_CAPSULE | Freq: Once | ORAL | Status: AC
  Administered 2017-12-27: 300 mg via ORAL

## 2017-12-27 SURGICAL SUPPLY — 43 items
BANDAGE ACE 3X5.8 VEL STRL LF (GAUZE/BANDAGES/DRESSINGS) ×2 IMPLANT
BANDAGE ACE 4X5 VEL STRL LF (GAUZE/BANDAGES/DRESSINGS) ×2 IMPLANT
BLADE CARPAL TUNNEL SNGL USE (BLADE) ×2 IMPLANT
BNDG GAUZE ELAST 4 BULKY (GAUZE/BANDAGES/DRESSINGS) ×2 IMPLANT
CORDS BIPOLAR (ELECTRODE) ×2 IMPLANT
COVER SURGICAL LIGHT HANDLE (MISCELLANEOUS) ×2 IMPLANT
CUFF TOURNIQUET SINGLE 18IN (TOURNIQUET CUFF) ×2 IMPLANT
CUFF TOURNIQUET SINGLE 24IN (TOURNIQUET CUFF) IMPLANT
DRAPE SURG 17X23 STRL (DRAPES) ×2 IMPLANT
DRSG EMULSION OIL 3X3 NADH (GAUZE/BANDAGES/DRESSINGS) ×2 IMPLANT
EVACUATOR 1/8 PVC DRAIN (DRAIN) IMPLANT
GAUZE SPONGE 4X4 12PLY STRL (GAUZE/BANDAGES/DRESSINGS) ×2 IMPLANT
GAUZE XEROFORM 1X8 LF (GAUZE/BANDAGES/DRESSINGS) ×2 IMPLANT
GLOVE BIOGEL M 8.0 STRL (GLOVE) ×2 IMPLANT
GLOVE SS BIOGEL STRL SZ 8 (GLOVE) ×1 IMPLANT
GLOVE SUPERSENSE BIOGEL SZ 8 (GLOVE) ×1
GOWN STRL REUS W/ TWL LRG LVL3 (GOWN DISPOSABLE) ×2 IMPLANT
GOWN STRL REUS W/ TWL XL LVL3 (GOWN DISPOSABLE) ×3 IMPLANT
GOWN STRL REUS W/TWL LRG LVL3 (GOWN DISPOSABLE) ×2
GOWN STRL REUS W/TWL XL LVL3 (GOWN DISPOSABLE) ×3
KIT BASIN OR (CUSTOM PROCEDURE TRAY) ×2 IMPLANT
KIT TURNOVER KIT B (KITS) ×2 IMPLANT
LOOP VESSEL MAXI BLUE (MISCELLANEOUS) IMPLANT
NEEDLE HYPO 25GX1X1/2 BEV (NEEDLE) IMPLANT
NS IRRIG 1000ML POUR BTL (IV SOLUTION) ×2 IMPLANT
PACK ORTHO EXTREMITY (CUSTOM PROCEDURE TRAY) ×2 IMPLANT
PAD ARMBOARD 7.5X6 YLW CONV (MISCELLANEOUS) ×4 IMPLANT
PAD CAST 4YDX4 CTTN HI CHSV (CAST SUPPLIES) ×1 IMPLANT
PADDING CAST COTTON 4X4 STRL (CAST SUPPLIES) ×1
SCRUB BETADINE 4OZ XXX (MISCELLANEOUS) ×2 IMPLANT
SOL PREP POV-IOD 4OZ 10% (MISCELLANEOUS) ×4 IMPLANT
SUT PROLENE 4 0 PS 2 18 (SUTURE) ×2 IMPLANT
SUT VIC AB 2-0 CT1 27 (SUTURE)
SUT VIC AB 2-0 CT1 TAPERPNT 27 (SUTURE) IMPLANT
SUT VIC AB 3-0 FS2 27 (SUTURE) IMPLANT
SYR CONTROL 10ML LL (SYRINGE) IMPLANT
SYSTEM CHEST DRAIN TLS 7FR (DRAIN) IMPLANT
TOWEL OR 17X24 6PK STRL BLUE (TOWEL DISPOSABLE) ×2 IMPLANT
TOWEL OR 17X26 10 PK STRL BLUE (TOWEL DISPOSABLE) ×2 IMPLANT
TUBE CONNECTING 12X1/4 (SUCTIONS) IMPLANT
TUBE EVACUATION TLS (MISCELLANEOUS) ×2 IMPLANT
UNDERPAD 30X30 (UNDERPADS AND DIAPERS) ×2 IMPLANT
WATER STERILE IRR 1000ML POUR (IV SOLUTION) IMPLANT

## 2017-12-27 NOTE — Op Note (Signed)
NAMECHRISLYN, [name] MEDICAL RECORD [id number] ACCOUNT [id number] DATE OF BIRTH:05-25-1956 FACILITY: [facility] LOCATION: [facility] PHYSICIAN:Verdell Kincannon M. 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  PROCEDURES: 1.  Carpal tunnel release, right upper extremity. 2.  Median nerve/peripheral nerve block, keeping the patient awake, alert and oriented for the anesthetic portion of the procedure. 3.  Gentle manipulation of the fingers.  SURGEON:  Roseanne Kaufman, MD  ASSISTANT:  None.  COMPLICATIONS:  None.  ANESTHESIA:  Peripheral nerve block/local block with IV sedation.  Keeping the patient awake, alert and oriented the entire case.  TOURNIQUET TIME:  Less  than 10 minutes.  INDICATIONS:  A 62 year old female with advanced changes about the wrist and hand secondary to carpal tunnel syndrome.  I have counseled her regarding risks and benefits of surgery including risk of infection, bleeding, anesthesia, damage to normal structures and failure of surgery to accomplish this in relieving symptoms and restoring function.  With this in mind, she  desires to proceed.  DESCRIPTION OF PROCEDURE:  The patient was seen by myself and anesthesia.  She was extensively counseled in the holding area.  Following this, she underwent prep and drape.  I should note that I performed a Hibiclens prescrub myself followed by a  field/median nerve block with lidocaine and Sensorcaine without epinephrine 1% and 0.25% respectively.  Following this, the patient was prepped with Betadine scrub and paint and final timeout was observed.  At this juncture with the patient nicely  anesthetized, I performed a gentle manipulation of the fingers and then an incision at the distal edge of the  transverse carpal ligament coursing proximally.  Dissection was carried down.  Palmar fascia incised.  Transverse carpal ligament was  identified, released and the fat pad grafts nicely.  This was a transverse carpal ligament release at the distal edge and this was verified as complete via the fat pad egression and identification of the superficial palmar arch.  Following this, distal  to proximal dissection was carried out and tagged where room was available for canal preparatory device 1, 2 and 3, followed by placement of the security clip and then the security knife effectively releasing the proximal leaflet and portions of the  antebrachial fascia.  The patient tolerated this well.  There were no complicating features.  She is awake, alert and oriented the entire case, had no complicating features.  Tourniquet was deflated less than 10 minutes.  Irrigation applied, hemostasis secured, wound sutured with Prolene.  Sterile bandage applied.    I discussed all issues with her husband, who is a Marine scientist and will take very good care of her.  Hydrocodone elixir was written and Robaxin pills.  These notes have been discussed.  See her in a week.  Therapy in 12 days.  Standard postop algorithm will be  adhered to.  All questions have been encouraged and answered.  AN/NUANCE  D:12/27/2017 T:12/27/2017 JOB:[id number]

## 2017-12-27 NOTE — Anesthesia Preprocedure Evaluation (Addendum)
Anesthesia Evaluation  Patient identified by MRN, date of birth, ID band Patient awake    Reviewed: Allergy & Precautions, H&P , NPO status , Patient's Chart, lab work & pertinent test results  Airway Mallampati: II  TM Distance: >3 FB Neck ROM: Full    Dental no notable dental hx. (+) Teeth Intact, Dental Advisory Given   Pulmonary asthma ,    Pulmonary exam normal breath sounds clear to auscultation       Cardiovascular hypertension, negative cardio ROS   Rhythm:Regular Rate:Normal     Neuro/Psych  Headaches, Anxiety Depression    GI/Hepatic negative GI ROS, Neg liver ROS,   Endo/Other  diabetes  Renal/GU negative Renal ROS  negative genitourinary   Musculoskeletal  (+) Fibromyalgia -  Abdominal   Peds  Hematology negative hematology ROS (+) anemia ,   Anesthesia Other Findings   Reproductive/Obstetrics negative OB ROS                             Anesthesia Physical Anesthesia Plan  ASA: II  Anesthesia Plan: MAC   Post-op Pain Management:    Induction: Intravenous  PONV Risk Score and Plan: 3 and Ondansetron, Dexamethasone and Midazolam  Airway Management Planned: Simple Face Mask  Additional Equipment:   Intra-op Plan:   Post-operative Plan:   Informed Consent: I have reviewed the patients History and Physical, chart, labs and discussed the procedure including the risks, benefits and alternatives for the proposed anesthesia with the patient or authorized representative who has indicated his/her understanding and acceptance.   Dental advisory given  Plan Discussed with: CRNA  Anesthesia Plan Comments:         Anesthesia Quick Evaluation

## 2017-12-27 NOTE — Op Note (Signed)
See full dictation number [id number] status post right limited open carpal tunnel release under local with IV sedation    Sivan Quast MD

## 2017-12-27 NOTE — Transfer of Care (Signed)
Immediate Anesthesia Transfer of Care Note  Patient: [name]  Procedure(s) Performed: CARPAL TUNNEL RELEASE (Right )  Patient Location: PACU  Anesthesia Type:MAC  Level of Consciousness: awake, alert  and oriented  Airway & Oxygen Therapy: Patient Spontanous Breathing and Patient connected to nasal cannula oxygen  Post-op Assessment: Report given to RN, Post -op Vital signs reviewed and stable and Patient moving all extremities  Post vital signs: Reviewed and stable  Last Vitals:  Vitals Value Taken Time  BP 138/75 12/27/2017  5:43 PM  Temp    Pulse 62 12/27/2017  5:46 PM  Resp 12 12/27/2017  5:46 PM  SpO2 100 % 12/27/2017  5:46 PM  Vitals shown include unvalidated device data.  Last Pain:  Vitals:   12/27/17 1317  TempSrc: Oral  PainSc:       Patients Stated Pain Goal: 5 (34/74/25 9563)  Complications: No apparent anesthesia complications

## 2017-12-27 NOTE — H&P (Signed)
[name] is an 62 y.o. female.   Chief Complaint: patient presents for right carpal tunnel release HPI: patient presents for right carpal tunnel release  Patient presents for evaluation and treatment of the of their upper extremity predicament. The patient denies neck, back, chest or  abdominal pain. The patient notes that they have no lower extremity problems. The patients primary complaint is noted. We are planning surgical care pathway for the upper extremity.  Past Medical History:  Diagnosis Date  . Allergic rhinitis   . Anxiety   . Asthma    hx bronchial asthma at times with upper resp infection  . Depression   . Diabetes mellitus without complication (HCC)    diet controlled no meds  . Fibromyalgia   . GERD (gastroesophageal reflux disease)   . Headache(784.0)    migraine and cluster headaches  . Hyperlipemia   . Hypertension   . IBS (irritable bowel syndrome)   . Menopause   . Neuromuscular disorder ([place]) 2009   hx of fibromyalgia, polyarthralsia (surgery induced)  . Stress incontinence    at times  . Tibial fracture 10/14/2016   evulsion periostial right    Past Surgical History:  Procedure Laterality Date  . ABDOMINAL HYSTERECTOMY  12/1999   complete  . CERVICAL CONIZATION W/BX  june 1990   dysplasia of cervix/used 5Fu cream for 3 months  . CERVICAL LAMINECTOMY  2005 & 2009   X 2   NO ROM PROBLEMS  . [place]  . DILATION AND CURETTAGE OF UTERUS   [phone]   missed abortion  . ESOPHAGOGASTRODUODENOSCOPY  06/29/2011   Procedure: ESOPHAGOGASTRODUODENOSCOPY (EGD);  Surgeon: Shann Medal, MD;  Location: [facility];  Service: General;  Laterality: N/A;  . FOOT SURGERY  2005   RT HEEL  . GASTRIC BANDING PORT REVISION  09/24/2011   Procedure: GASTRIC BANDING PORT REVISION;  Surgeon: Pedro Earls, MD;  Location: [facility];  Service: General;  Laterality: N/A;  . GASTRIC ROUX-EN-Y N/A 06/06/2017   Procedure: Conversion from [place]leeve to [place];  Surgeon: Johnathan Hausen, MD;  Location: [facility];  Service: General;  Laterality: N/A;  . HYSTEROSCOPY  1999  . LAPAROSCOPIC GASTRIC BANDING  12/30/09  . LAPAROSCOPIC GASTRIC SLEEVE RESECTION N/A 07/02/2013   Procedure: LAPAROSCOPIC GASTRIC SLEEVE RESECTION upper endoscopy;  Surgeon: Pedro Earls, MD;  Location: [facility];  Service: General;  Laterality: N/A;  . LAPAROSCOPIC REPAIR AND REMOVAL OF GASTRIC BAND N/A 07/02/2013   Procedure: LAPAROSCOPIC REMOVAL OF GASTRIC BAND;  Surgeon: Pedro Earls, MD;  Location: [facility];  Service: General;  Laterality: N/A;  . LAPAROSCOPIC REVISION OF GASTRIC BAND  07/03/2012   Procedure: LAPAROSCOPIC REVISION OF GASTRIC BAND;  Surgeon: Pedro Earls, MD;  Location: [facility];  Service: General;  Laterality: N/A;  removal of old lap. band port, replaced with AP standard band  . LAPAROSCOPY  09/24/2011   Procedure: LAPAROSCOPY DIAGNOSTIC;  Surgeon: Pedro Earls, MD;  Location: [facility];  Service: General;  Laterality: N/A;  . LAPAROSCOPY  07/03/2012   Procedure: LAPAROSCOPY DIAGNOSTIC;  Surgeon: Pedro Earls, MD;  Location: [facility];  Service: General;  Laterality: N/A;  Exploratory Laparoscopy   . MESH APPLIED TO LAP PORT  07/03/2012   Procedure: MESH APPLIED TO LAP PORT;  Surgeon: Pedro Earls, MD;  Location: [facility];  Service: General;  Laterality: N/A;  .  [place]   X 2  . right knee  1981   ARTHROSCOPY AND ARTHROTOMY  . right knee arthroscopy and arthrotomy  12-1979  . TONSILLECTOMY  1971  . TUBAL LIGATION  1993   WITH C -SECTION    Family History  Problem Relation Age of Onset  . Diabetes Mother   . Stroke Mother   . Breast cancer Mother   . Atrial fibrillation Mother   . Hypertension Mother   . Cancer Mother        breast  . Diabetes Father   . Stroke Father   . Hypertension Father   . Heart attack Father   .  Thyroid disease Unknown   . Heart disease Unknown   . COPD Other    Social History:  reports that she has never smoked. She has never used smokeless tobacco. She reports that she does not drink alcohol or use drugs.  Allergies:  Allergies  Allergen Reactions  . Isoniazid     Severe SOB  . Nitrofurantoin Shortness Of Breath    REACTION: welts  . Hctz [Hydrochlorothiazide]     rash  . Promethazine Hcl [Promethazine Hcl]     IF GIVEN  IV-hallucinations     CAN TAKE PO PHENERGAN  . Tamiflu [Oseltamivir Phosphate] Nausea And Vomiting    "I vomited within 30 minutes of taking it and was told I cannot ever take it again."  . Macrolides And Ketolides     rash  . Morphine Other (See Comments)    severe vomiting  . Percocet [Oxycodone-Acetaminophen]     REACTION: severe itching. Can take by mouth codeine  . Roxicet [Oxycodone-Acetaminophen]     itching  . Sulfamethoxazole-Trimethoprim     Septra / Bactrim  --REACTION: rash  . Fentanyl Hives and Rash    REACTION: welts    Medications Prior to Admission  Medication Sig Dispense Refill  . acetaminophen (TYLENOL 8 HOUR ARTHRITIS PAIN) 650 MG CR tablet Take 650 mg by mouth 2 (two) times daily as needed for pain.    . Blood Glucose Monitoring Suppl (BAYER CONTOUR LINK MONITOR) W/DEVICE KIT Test blood sugar once daily DX code: E11.9 1 kit 1  . Ca Phosphate-Cholecalciferol (CALTRATE GUMMY BITES PO) Take 2 each 2 (two) times daily by mouth.     . citalopram (CELEXA) 20 MG tablet TAKE 1 TABLET (20 MG TOTAL) BY MOUTH EVERY EVENING. 90 tablet 3  . cyclobenzaprine (FLEXERIL) 10 MG tablet Take 1 tablet (10 mg total) by mouth 3 (three) times daily as needed for muscle spasms. 30 tablet 0  . Diclofenac Sodium (PENNSAID) 2 % SOLN Place 1 application onto the skin 2 (two) times daily as needed (pain).    Marland Kitchen estradiol (VIVELLE-DOT) 0.1 MG/24HR Place 1 patch (0.1 mg total) onto the skin 2 (two) times a week. Monday and Thursday (Patient taking differently:  Place 1 patch onto the skin 2 (two) times a week. Mondays and Thursdays) 8 patch 11  . furosemide (LASIX) 20 MG tablet TAKE 1 TABLET BY MOUTH EVERY MORNING 90 tablet 1  . gabapentin (NEURONTIN) 100 MG capsule Take 1 capsule (100 mg total) by mouth 3 (three) times daily. (Patient taking differently: Take 200 mg by mouth at bedtime. Patient is tapering up final dosing=300 mg at bedtime) 90 capsule 3  . glucose blood (ONETOUCH VERIO) test strip Check blood sugar daily 100 each 12  . glucose blood test strip Test blood sugar once daily.  Dx Code: E11.9 100 each 12  . HYDROcodone-acetaminophen (HYCET) 7.5-325 mg/15 ml solution Take 10 mLs by mouth 4 (four) times daily as needed for moderate pain. 120 mL 0  . loratadine (CLARITIN) 10 MG tablet Take 10 mg by mouth every evening.     . methocarbamol (ROBAXIN) 500 MG tablet Take 1 tablet (500 mg total) by mouth 4 (four) times daily. (Patient taking differently: Take 500 mg by mouth 4 (four) times daily as needed (for spasms.). ) 10 tablet 1  . olmesartan (BENICAR) 5 MG tablet Take 1 tablet (5 mg total) by mouth daily. 90 tablet 3  . ONETOUCH DELICA LANCETS FINE MISC check blood sugar daily 100 each 12  . Pediatric Multivit-Minerals-C (ONE-A-DAY SCOOBY-DOO GUMMIES PO) Take 1 tablet 2 (two) times daily by mouth.     . potassium chloride (K-DUR,KLOR-CON) 10 MEQ tablet TAKE 1 TABLET (10 MEQ TOTAL) BY MOUTH DAILY. (Patient taking differently: Take 10 mEq by mouth every evening. ) 90 tablet 1  . PREMARIN vaginal cream Apply 1 application topically 2 (two) times a week. Mondays & Thursdays.  1  . rosuvastatin (CRESTOR) 20 MG tablet TAKE 1 TABLET (20 MG TOTAL) BY MOUTH AT BEDTIME. 90 tablet 1  . temazepam (RESTORIL) 30 MG capsule Take 30 mg by mouth at bedtime as needed for sleep.  0  . thiamine (VITAMIN B-1) 100 MG tablet Take 100 mg daily by mouth.    . traZODone (DESYREL) 50 MG tablet Take 0.5-1 tablets (25-50 mg total) by mouth at bedtime as needed for sleep. 30  tablet 3  . HYDROcodone-acetaminophen (NORCO/VICODIN) 5-325 MG tablet Take 1 tablet by mouth every 6 (six) hours as needed for moderate pain. (Patient not taking: Reported on 12/20/2017) 30 tablet 0  . silver sulfADIAZINE (SILVADENE) 1 % cream Apply 1 application topically daily. (Patient not taking: Reported on 12/20/2017) 400 g 0    Results for orders placed or performed during the hospital encounter of 12/27/17 (from the past 48 hour(s))  Glucose, capillary     Status: Abnormal   Collection Time: 12/27/17  1:19 PM  Result Value Ref Range   Glucose-Capillary 107 (H) 65 - 99 mg/dL   Comment 1 Notify RN    Comment 2 Document in Chart    No results found.  ROS  Blood pressure 124/60, pulse 72, temperature 98.4 F (36.9 C), temperature source Oral, resp. rate 18, height '5\' 11"'  (1.803 m), weight 86.2 kg (190 lb 1.6 oz), SpO2 98 %. Physical Exam  Patient has right carpal tunnel syndrome.  She has positive examination findings for carpal tunnel syndrome with positive Phalen's Tinel's and median nerve compression test.  We will plan for right limited open carpal tunnel release  The patient is alert and oriented in no acute distress. The patient complains of pain in the affected upper extremity.  The patient is noted to have a normal HEENT exam. Lung fields show equal chest expansion and no shortness of breath. Abdomen exam is nontender without distention. Lower extremity examination does not show any fracture dislocation or blood clot symptoms. Pelvis is stable and the neck and back are stable and nontender. Assessment/Plan We will plan for right limited open carpal tunnel release  We are planning surgery for your upper extremity. The risk and benefits of surgery to include risk of bleeding, infection, anesthesia,  damage to normal structures and failure of the surgery to accomplish its intended goals of relieving symptoms and restoring function have been discussed in detail.  With this in  mind we plan to proceed. I have specifically discussed with the patient the pre-and postoperative regime and the dos and don'ts and risk and benefits in great detail. Risk and benefits of surgery also include risk of dystrophy(CRPS), chronic nerve pain, failure of the healing process to go onto completion and other inherent risks of surgery The relavent the pathophysiology of the disease/injury process, as well as the alternatives for treatment and postoperative course of action has been discussed in great detail with the patient who desires to proceed.  We will do everything in our power to help you (the patient) restore function to the upper extremity. It is a pleasure to see this patient today.   Willa Frater III, MD 12/27/2017, 2:52 PM

## 2017-12-27 NOTE — Discharge Instructions (Signed)
Please elevate move and massage her fingers.  Please keep your bandage clean and dry.  We will call for your follow-up to be seen in 1 week.  We recommend vitamin B6 200 mg a day for nerve health and vitamin C thousand milligrams a day to promote healing  Keep bandage clean and dry.  Call for any problems.  No smoking.  Criteria for driving a car: you should be off your pain medicine for 7-8 hours, able to drive one handed(confident), thinking clearly and feeling able in your judgement to drive. Continue elevation as it will decrease swelling.  If instructed by MD move your fingers within the confines of the bandage/splint.  Use ice if instructed by your MD. Call immediately for any sudden loss of feeling in your hand/arm or change in functional abilities of the extremity.  We recommend that you to take vitamin C 1000 mg a day to promote healing. We also recommend that if you require  pain medicine that you take a stool softener to prevent constipation as most pain medicines will have constipation side effects. We recommend either Peri-Colace or Senokot and recommend that you also consider adding MiraLAX as well to prevent the constipation affects from pain medicine if you are required to use them. These medicines are over the counter and may be purchased at a local pharmacy. A cup of yogurt and a probiotic can also be helpful during the recovery process as the medicines can disrupt your intestinal environment.

## 2017-12-28 ENCOUNTER — Ambulatory Visit: Payer: 59 | Admitting: Podiatry

## 2017-12-28 ENCOUNTER — Encounter (HOSPITAL_COMMUNITY): Payer: Self-pay | Admitting: Orthopedic Surgery

## 2017-12-29 NOTE — Anesthesia Postprocedure Evaluation (Signed)
Anesthesia Post Note  Patient: [name]  Procedure(s) Performed: CARPAL TUNNEL RELEASE (Right Wrist)     Patient location during evaluation: PACU Anesthesia Type: MAC Level of consciousness: awake and alert Pain management: pain level controlled Vital Signs Assessment: post-procedure vital signs reviewed and stable Respiratory status: spontaneous breathing, nonlabored ventilation and respiratory function stable Cardiovascular status: stable and blood pressure returned to baseline Postop Assessment: no apparent nausea or vomiting Anesthetic complications: no    Last Vitals:  Vitals:   12/27/17 1813 12/27/17 1843  BP: 113/66 114/69  Pulse: (!) 58   Resp: 12   Temp:    SpO2: 97%     Last Pain:  Vitals:   12/27/17 1843  TempSrc:   PainSc: 0-No pain                 Nahome Bublitz,W. EDMOND

## 2018-01-04 ENCOUNTER — Encounter: Payer: Self-pay | Admitting: Internal Medicine

## 2018-01-04 DIAGNOSIS — G5601 Carpal tunnel syndrome, right upper limb: Secondary | ICD-10-CM | POA: Diagnosis not present

## 2018-01-04 DIAGNOSIS — M25512 Pain in left shoulder: Secondary | ICD-10-CM | POA: Diagnosis not present

## 2018-01-04 DIAGNOSIS — M25511 Pain in right shoulder: Secondary | ICD-10-CM | POA: Diagnosis not present

## 2018-01-04 DIAGNOSIS — Z4789 Encounter for other orthopedic aftercare: Secondary | ICD-10-CM | POA: Diagnosis not present

## 2018-01-04 DIAGNOSIS — M79641 Pain in right hand: Secondary | ICD-10-CM | POA: Diagnosis not present

## 2018-01-05 ENCOUNTER — Ambulatory Visit: Payer: 59 | Admitting: Podiatry

## 2018-01-05 ENCOUNTER — Encounter: Payer: Self-pay | Admitting: Podiatry

## 2018-01-05 DIAGNOSIS — B351 Tinea unguium: Secondary | ICD-10-CM

## 2018-01-05 DIAGNOSIS — E0842 Diabetes mellitus due to underlying condition with diabetic polyneuropathy: Secondary | ICD-10-CM | POA: Diagnosis not present

## 2018-01-05 DIAGNOSIS — L600 Ingrowing nail: Secondary | ICD-10-CM

## 2018-01-05 NOTE — Progress Notes (Signed)
Subjective: 62 y.o. year old female patient presents complaining of painful nails. Patient requests toe nails, corns and calluses trimmed.  Blood sugar is under control with A1c below 6. Recent history of Carpal tunnel surgery on right.  Objective: Dermatologic: Thick yellow deformed nails x 10. Ingrown hallucal nails both great toes. Excess keratosis ungual labia 2nd toe nail right. Vascular: Pedal pulses are all palpable. Orthopedic: Contracted 2nd toe right. Neurologic: Diagnosed with Diabetic polyneuropathy.  Assessment: Dystrophic mycotic nails x 10. Ingrown hallucal nails. Painful nails.  Treatment: All mycotic nails, corns, calluses debrided.  Return in 3 months or as needed.

## 2018-01-05 NOTE — Patient Instructions (Signed)
Seen for hypertrophic nails. All nails debrided. Return in 3 months or as needed.  

## 2018-01-10 DIAGNOSIS — M25631 Stiffness of right wrist, not elsewhere classified: Secondary | ICD-10-CM | POA: Diagnosis not present

## 2018-01-17 MED FILL — GABAPENTIN 100 MG CAPSULE: 100 | 30 days supply | Qty: 90 | Fill #1

## 2018-01-20 ENCOUNTER — Encounter: Payer: 59 | Admitting: Internal Medicine

## 2018-01-27 DIAGNOSIS — M25631 Stiffness of right wrist, not elsewhere classified: Secondary | ICD-10-CM | POA: Diagnosis not present

## 2018-02-14 DIAGNOSIS — Z1211 Encounter for screening for malignant neoplasm of colon: Secondary | ICD-10-CM | POA: Diagnosis not present

## 2018-02-21 ENCOUNTER — Other Ambulatory Visit: Payer: Self-pay | Admitting: Family Medicine

## 2018-02-21 DIAGNOSIS — M5412 Radiculopathy, cervical region: Secondary | ICD-10-CM

## 2018-02-21 MED FILL — METHOCARBAMOL 500 MG TABLET: 500 | 4 days supply | Qty: 10 | Fill #1

## 2018-02-21 MED FILL — PANTOPRAZOLE SOD DR 40 MG T: 40 | 90 days supply | Qty: 90 | Fill #3

## 2018-02-21 MED FILL — CYCLOBENZAPRINE HCL 10 MG T: 10 | 10 days supply | Qty: 30 | Fill #0

## 2018-02-21 MED FILL — ESTRADIOL 0.1 MG PATCH: 0.1 | 90 days supply | Qty: 24 | Fill #0

## 2018-02-21 MED FILL — OLMESARTAN MEDOXOMIL 5 MG T: 5 | 90 days supply | Qty: 90 | Fill #1

## 2018-02-21 MED FILL — CITALOPRAM HBR 20 MG TABLET: 20 | 90 days supply | Qty: 90 | Fill #3

## 2018-03-06 DIAGNOSIS — M25511 Pain in right shoulder: Secondary | ICD-10-CM | POA: Diagnosis not present

## 2018-03-06 DIAGNOSIS — M25512 Pain in left shoulder: Secondary | ICD-10-CM | POA: Diagnosis not present

## 2018-03-06 MED FILL — predniSONE 10 MG TABS: 10 | 12 days supply | Qty: 21 | Fill #0

## 2018-03-08 DIAGNOSIS — Z09 Encounter for follow-up examination after completed treatment for conditions other than malignant neoplasm: Secondary | ICD-10-CM | POA: Diagnosis not present

## 2018-03-08 DIAGNOSIS — K5730 Diverticulosis of large intestine without perforation or abscess without bleeding: Secondary | ICD-10-CM | POA: Diagnosis not present

## 2018-03-08 DIAGNOSIS — Z1211 Encounter for screening for malignant neoplasm of colon: Secondary | ICD-10-CM | POA: Diagnosis not present

## 2018-03-08 LAB — HM COLONOSCOPY

## 2018-03-09 DIAGNOSIS — H04123 Dry eye syndrome of bilateral lacrimal glands: Secondary | ICD-10-CM | POA: Diagnosis not present

## 2018-03-09 DIAGNOSIS — E119 Type 2 diabetes mellitus without complications: Secondary | ICD-10-CM | POA: Diagnosis not present

## 2018-03-09 DIAGNOSIS — H2513 Age-related nuclear cataract, bilateral: Secondary | ICD-10-CM | POA: Diagnosis not present

## 2018-03-09 LAB — HM DIABETES EYE EXAM

## 2018-03-20 ENCOUNTER — Encounter: Payer: Self-pay | Admitting: Family Medicine

## 2018-03-21 ENCOUNTER — Encounter: Payer: 59 | Admitting: Internal Medicine

## 2018-03-21 MED FILL — GABAPENTIN 100 MG CAPSULE: 100 | 30 days supply | Qty: 90 | Fill #2

## 2018-03-21 NOTE — Telephone Encounter (Signed)
I can not with out seeing her and I'm off tomorrow.   We can see if someone else can see her tomorrow --- does edward have anything?

## 2018-03-22 ENCOUNTER — Encounter: Payer: Self-pay | Admitting: Medical

## 2018-03-22 ENCOUNTER — Ambulatory Visit: Payer: 59 | Admitting: Medical

## 2018-03-22 VITALS — BP 139/82 | HR 71 | Temp 98.2°F | Resp 16 | Ht 71.0 in | Wt 189.2 lb

## 2018-03-22 DIAGNOSIS — M2550 Pain in unspecified joint: Secondary | ICD-10-CM | POA: Diagnosis not present

## 2018-03-22 DIAGNOSIS — G5601 Carpal tunnel syndrome, right upper limb: Secondary | ICD-10-CM | POA: Diagnosis not present

## 2018-03-22 LAB — SEDIMENTATION RATE: Sed Rate: 36 mm/hr — ABNORMAL HIGH (ref 0–30)

## 2018-03-22 LAB — C-REACTIVE PROTEIN: CRP: 1.7 mg/dL (ref 0.5–20.0)

## 2018-03-22 MED ORDER — HYDROCODONE-ACETAMINOPHEN 7.5-325 MG/15ML PO SOLN: 10.0000 mL | Freq: Four times a day (QID) | ORAL | 0 refills | Status: DC | PRN

## 2018-03-22 MED ORDER — KETOROLAC TROMETHAMINE 60 MG/2ML IM SOLN
60.0000 mg | Freq: Once | INTRAMUSCULAR | Status: AC
  Administered 2018-03-22: 60 mg via INTRAMUSCULAR

## 2018-03-22 MED FILL — HYDROCOD-APAP 7.5-325/15ML: 7.5-325 | 3 days supply | Qty: 120 | Fill #0

## 2018-03-22 NOTE — Progress Notes (Signed)
Subjective:    Patient ID: [name], female    DOB: 10-17-1955, 62 y.o.   MRN: [id number]  HPI  Pt in with some recent neck pain in past. Hx of neck pain in past with surgery. Some history of known degenerative changes on most recent neck xray per pt. 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  Pt also has some bilateral shoulder pain for which she got cortisone injections.  Now patient is reporting diffuse joints all over elbows, wrist, knees, and ankles. This has been since beginning of august.  Pt states one of her ortho MD thought statin side effect. Pt cpk came back at 24. She has not been on crestor since June. So she thinks that is not the cause.  Pt state ortho know thinks she has RA. She has appointment on 04-03-2018 with rheumatologist.   Pt has trouble staying asleep after 4-5 hours and she states has pain with daily activities.   No recent st, no rash or diffuse myalgias. She is explaining the pain is in joints.  No recent tick bite.  Pt is on gabapentin 300 mg at night. She takes flexeril at night. She has hydrocodone liquid  available if needed.But describes just about to run out.  Pt last a1c was 5.8. Pt sugar this am was 89.  Pt was told by her former Education officer, environmental not to use steroids due to risk of pouch rupture.    Review of Systems  Constitutional: Negative for chills, fatigue and fever.  Respiratory: Negative for chest tightness, shortness of breath and wheezing.   Cardiovascular: Negative for chest pain and palpitations.  Gastrointestinal: Negative for abdominal pain.  Musculoskeletal: Positive for arthralgias.  Skin: Negative for rash.  Neurological: Negative for dizziness, seizures, speech difficulty, weakness and headaches.  Hematological: Negative for adenopathy. Does not bruise/bleed easily.  Psychiatric/Behavioral: Negative for behavioral problems and confusion.    Past Medical History:  Diagnosis Date  . Allergic rhinitis   .  Anxiety   . Asthma    hx bronchial asthma at times with upper resp infection  . Depression   . Diabetes mellitus without complication (HCC)    diet controlled no meds  . Fibromyalgia   . GERD (gastroesophageal reflux disease)   . Headache(784.0)    migraine and cluster headaches  . Hyperlipemia   . Hypertension   . IBS (irritable bowel syndrome)   . Menopause   . Neuromuscular disorder ([place]) 2009   hx of fibromyalgia, polyarthralsia (surgery induced)  . Stress incontinence    at times  . Tibial fracture 10/14/2016   evulsion periostial right     Social History   Socioeconomic History  . Marital status: Married    Spouse name: Not on file  . Number of children: Not on file  . Years of education: Not on file  . Highest education level: Not on file  Occupational History  . Occupation: Programmer, multimedia: [employer]  Social Needs  . Financial resource strain: Not on file  . Food insecurity:    Worry: Not on file    Inability: Not on file  . Transportation needs:    Medical: Not on file    Non-medical: Not on file  Tobacco Use  . Smoking status: Never Smoker  . Smokeless tobacco: Never Used  Substance and Sexual Activity  . Alcohol use: No    Alcohol/week: 0.0 standard drinks  . Drug use: No  .  Sexual activity: Yes    Birth control/protection: None  Lifestyle  . Physical activity:    Days per week: Not on file    Minutes per session: Not on file  . Stress: Not on file  Relationships  . Social connections:    Talks on phone: Not on file    Gets together: Not on file    Attends religious service: Not on file    Active member of club or organization: Not on file    Attends meetings of clubs or organizations: Not on file    Relationship status: Not on file  . Intimate partner violence:    Fear of current or ex partner: Not on file    Emotionally abused: Not on file    Physically abused: Not on file    Forced sexual activity: Not on file  Other  Topics Concern  . Not on file  Social History Narrative  . Not on file    Past Surgical History:  Procedure Laterality Date  . ABDOMINAL HYSTERECTOMY  12/1999   complete  . CARPAL TUNNEL RELEASE Right 12/27/2017   Procedure: CARPAL TUNNEL RELEASE;  Surgeon: Roseanne Kaufman, MD;  Location: [facility];  Service: Orthopedics;  Laterality: Right;  . CERVICAL CONIZATION W/BX  june 1990   dysplasia of cervix/used 5Fu cream for 3 months  . CERVICAL LAMINECTOMY  2005 & 2009   X 2   NO ROM PROBLEMS  . [place]  . DILATION AND CURETTAGE OF UTERUS   [phone]   missed abortion  . ESOPHAGOGASTRODUODENOSCOPY  06/29/2011   Procedure: ESOPHAGOGASTRODUODENOSCOPY (EGD);  Surgeon: Shann Medal, MD;  Location: [facility];  Service: General;  Laterality: N/A;  . FOOT SURGERY  2005   RT HEEL  . GASTRIC BANDING PORT REVISION  09/24/2011   Procedure: GASTRIC BANDING PORT REVISION;  Surgeon: Pedro Earls, MD;  Location: [facility];  Service: General;  Laterality: N/A;  . GASTRIC ROUX-EN-Y N/A 06/06/2017   Procedure: Conversion from [place]leeve to [place];  Surgeon: Johnathan Hausen, MD;  Location: [facility];  Service: General;  Laterality: N/A;  . HYSTEROSCOPY  1999  . LAPAROSCOPIC GASTRIC BANDING  12/30/09  . LAPAROSCOPIC GASTRIC SLEEVE RESECTION N/A 07/02/2013   Procedure: LAPAROSCOPIC GASTRIC SLEEVE RESECTION upper endoscopy;  Surgeon: Pedro Earls, MD;  Location: [facility];  Service: General;  Laterality: N/A;  . LAPAROSCOPIC REPAIR AND REMOVAL OF GASTRIC BAND N/A 07/02/2013   Procedure: LAPAROSCOPIC REMOVAL OF GASTRIC BAND;  Surgeon: Pedro Earls, MD;  Location: [facility];  Service: General;  Laterality: N/A;  . LAPAROSCOPIC REVISION OF GASTRIC BAND  07/03/2012   Procedure: LAPAROSCOPIC REVISION OF GASTRIC BAND;  Surgeon: Pedro Earls, MD;  Location: [facility];  Service: General;  Laterality: N/A;  removal  of old lap. band port, replaced with AP standard band  . LAPAROSCOPY  09/24/2011   Procedure: LAPAROSCOPY DIAGNOSTIC;  Surgeon: Pedro Earls, MD;  Location: [facility];  Service: General;  Laterality: N/A;  . LAPAROSCOPY  07/03/2012   Procedure: LAPAROSCOPY DIAGNOSTIC;  Surgeon: Pedro Earls, MD;  Location: [facility];  Service: General;  Laterality: N/A;  Exploratory Laparoscopy   . MESH APPLIED TO LAP PORT  07/03/2012   Procedure: MESH APPLIED TO LAP PORT;  Surgeon: Pedro Earls, MD;  Location: [facility];  Service: General;  Laterality: N/A;  . NASAL SINUS SURGERY  [place]   X 2  . right knee  1981   ARTHROSCOPY AND ARTHROTOMY  . right knee arthroscopy and arthrotomy  12-1979  . TONSILLECTOMY  1971  . TUBAL LIGATION  1993   WITH C -SECTION    Family History  Problem Relation Age of Onset  . Diabetes Mother   . Stroke Mother   . Breast cancer Mother   . Atrial fibrillation Mother   . Hypertension Mother   . Cancer Mother        breast  . Diabetes Father   . Stroke Father   . Hypertension Father   . Heart attack Father   . Thyroid disease Unknown   . Heart disease Unknown   . COPD Other     Allergies  Allergen Reactions  . Isoniazid     Severe SOB  . Nitrofurantoin Shortness Of Breath    REACTION: welts  . Hctz [Hydrochlorothiazide]     rash  . Promethazine Hcl [Promethazine Hcl]     IF GIVEN  IV-hallucinations     CAN TAKE PO PHENERGAN  . Tamiflu [Oseltamivir Phosphate] Nausea And Vomiting    "I vomited within 30 minutes of taking it and was told I cannot ever take it again."  . Macrolides And Ketolides     rash  . Morphine Other (See Comments)    severe vomiting  . Percocet [Oxycodone-Acetaminophen]     REACTION: severe itching. Can take by mouth codeine  . Roxicet [Oxycodone-Acetaminophen]     itching  . Sulfamethoxazole-Trimethoprim     Septra / Bactrim  --REACTION: rash  . Fentanyl Hives and Rash    REACTION: welts    Current Outpatient Medications  on File Prior to Visit  Medication Sig Dispense Refill  . acetaminophen (TYLENOL 8 HOUR ARTHRITIS PAIN) 650 MG CR tablet Take 650 mg by mouth 2 (two) times daily as needed for pain.    . Blood Glucose Monitoring Suppl (BAYER CONTOUR LINK MONITOR) W/DEVICE KIT Test blood sugar once daily DX code: E11.9 1 kit 1  . Ca Phosphate-Cholecalciferol (CALTRATE GUMMY BITES PO) Take 2 each 2 (two) times daily by mouth.     . citalopram (CELEXA) 20 MG tablet TAKE 1 TABLET (20 MG TOTAL) BY MOUTH EVERY EVENING. 90 tablet 3  . cyclobenzaprine (FLEXERIL) 10 MG tablet TAKE 1 TABLET (10 MG TOTAL) BY MOUTH 3 (THREE) TIMES DAILY AS NEEDED FOR MUSCLE SPASMS. 30 tablet 0  . Diclofenac Sodium (PENNSAID) 2 % SOLN Place 1 application onto the skin 2 (two) times daily as needed (pain).    Marland Kitchen estradiol (VIVELLE-DOT) 0.1 MG/24HR Place 1 patch (0.1 mg total) onto the skin 2 (two) times a week. Monday and Thursday (Patient taking differently: Place 1 patch onto the skin 2 (two) times a week. Mondays and Thursdays) 8 patch 11  . furosemide (LASIX) 20 MG tablet TAKE 1 TABLET BY MOUTH EVERY MORNING 90 tablet 1  . gabapentin (NEURONTIN) 100 MG capsule Take 1 capsule (100 mg total) by mouth 3 (three) times daily. (Patient taking differently: Take 200 mg by mouth at bedtime. Patient is tapering up final dosing=300 mg at bedtime) 90 capsule 3  . glucose blood (ONETOUCH VERIO) test strip Check blood sugar daily 100 each 12  . glucose blood test strip Test blood sugar once daily. Dx Code: E11.9 100 each 12  . loratadine (CLARITIN) 10 MG tablet Take 10 mg by mouth every evening.     . methocarbamol (ROBAXIN) 500 MG  tablet Take 1 tablet (500 mg total) by mouth 4 (four) times daily. (Patient taking differently: Take 500 mg by mouth 4 (four) times daily as needed (for spasms.). ) 10 tablet 1  . olmesartan (BENICAR) 5 MG tablet Take 1 tablet (5 mg total) by mouth daily. 90 tablet 3  . ONETOUCH DELICA LANCETS FINE MISC check blood sugar daily 100  each 12  . Pediatric Multivit-Minerals-C (ONE-A-DAY SCOOBY-DOO GUMMIES PO) Take 1 tablet 2 (two) times daily by mouth.     . potassium chloride (K-DUR,KLOR-CON) 10 MEQ tablet TAKE 1 TABLET (10 MEQ TOTAL) BY MOUTH DAILY. (Patient taking differently: Take 10 mEq by mouth every evening. ) 90 tablet 1  . PREMARIN vaginal cream Apply 1 application topically 2 (two) times a week. Mondays & Thursdays.  1  . rosuvastatin (CRESTOR) 20 MG tablet TAKE 1 TABLET (20 MG TOTAL) BY MOUTH AT BEDTIME. 90 tablet 1  . temazepam (RESTORIL) 30 MG capsule Take 30 mg by mouth at bedtime as needed for sleep.  0  . thiamine (VITAMIN B-1) 100 MG tablet Take 100 mg daily by mouth.    . traZODone (DESYREL) 50 MG tablet Take 0.5-1 tablets (25-50 mg total) by mouth at bedtime as needed for sleep. 30 tablet 3   No current facility-administered medications on file prior to visit.     BP 139/82   Pulse 71   Temp 98.2 F (36.8 C) (Oral)   Resp 16   Ht '5\' 11"'  (1.803 m)   Wt 189 lb 3.2 oz (85.8 kg)   SpO2 100%   BMI 26.39 kg/m       Objective:   Physical Exam  General Mental Status- Alert. General Appearance- Not in acute distress.   Skin General: Color- Normal Color. Moisture- Normal Moisture.  Neck Carotid Arteries- Normal color. Moisture- Normal Moisture. No carotid bruits. No JVD.  Chest and Lung Exam Auscultation: Breath Sounds:-Normal.  Cardiovascular Auscultation:Rythm- Regular. Murmurs & Other Heart Sounds:Auscultation of the heart reveals- No Murmurs.  Abdomen Inspection:-Inspeection Normal. Palpation/Percussion:Note:No mass. Palpation and Percussion of the abdomen reveal- Non Tender, Non Distended + BS, no rebound or guarding.   Neurologic Cranial Nerve exam:- CN III-XII intact(No nystagmus), symmetric smile. Strength:- 5/5 equal and symmetric strength both upper and lower extremities.   Shoulder, wrist, knees, elbows and ankle - tender on palpation and range of motion. No severe warmth.  Mild swelling.     Assessment & Plan:  For your history of diffuse joint pains, we will get arthritis panel studies today.   Continue your current medication regimen. We did you give you toradol 60 mg im today with hopes of giving you some relief and I did refill your hydrocodone liquid.  Hopefully above regimen will get you to rheumatologist with reasonably controlled pain. If not please let us know.  Follow up as regularly scheduled with pcp or as needed  Mackie Pai, PA-C

## 2018-03-22 NOTE — Patient Instructions (Signed)
For your history of diffuse joint pains, we will get arthritis panel studies today.   Continue your current medication regimen. We did you give you toradol 60 mg im today with hopes of giving you some relief and I did refill your hydrocodone liquid.  Hopefully above regimen will get you to rheumatologist with reasonably controlled pain. If not please let us know.  Follow up as regularly scheduled with pcp or as needed

## 2018-03-27 ENCOUNTER — Encounter: Payer: Self-pay | Admitting: Family Medicine

## 2018-03-27 LAB — HLA-B27 ANTIGEN: HLA-B27 Antigen: NEGATIVE

## 2018-03-27 LAB — ANA: Anti Nuclear Antibody(ANA): NEGATIVE

## 2018-03-27 LAB — RHEUMATOID FACTOR: Rhuematoid fact SerPl-aCnc: <14 IU/mL (ref <14)

## 2018-03-27 NOTE — Telephone Encounter (Signed)
Only problem with depo medrol is it could make her sugar go up Can do toradol 60 mg im

## 2018-03-28 ENCOUNTER — Encounter: Payer: Self-pay | Admitting: Medical

## 2018-03-28 ENCOUNTER — Ambulatory Visit: Payer: 59 | Admitting: Family Medicine

## 2018-03-28 ENCOUNTER — Ambulatory Visit (INDEPENDENT_AMBULATORY_CARE_PROVIDER_SITE_OTHER): Payer: 59

## 2018-03-28 DIAGNOSIS — M2550 Pain in unspecified joint: Secondary | ICD-10-CM | POA: Diagnosis not present

## 2018-03-28 MED ORDER — KETOROLAC TROMETHAMINE 60 MG/2ML IM SOLN
60.0000 mg | Freq: Once | INTRAMUSCULAR | Status: AC
  Administered 2018-03-28: 60 mg via INTRAMUSCULAR

## 2018-03-28 NOTE — Progress Notes (Signed)
Pre visit review using our clinic tool,if applicable. No additional management support is needed unless otherwise documented below in the visit note.   Patient in for Toradol injection per order from Dr. Carollee Herter.   Given IM left hip. Patient tolerated well.

## 2018-04-01 ENCOUNTER — Encounter: Payer: Self-pay | Admitting: Family Medicine

## 2018-04-03 DIAGNOSIS — M2550 Pain in unspecified joint: Secondary | ICD-10-CM | POA: Diagnosis not present

## 2018-04-03 DIAGNOSIS — M130 Polyarthritis, unspecified: Secondary | ICD-10-CM | POA: Diagnosis not present

## 2018-04-03 DIAGNOSIS — Z6826 Body mass index (BMI) 26.0-26.9, adult: Secondary | ICD-10-CM | POA: Diagnosis not present

## 2018-04-03 DIAGNOSIS — Z9884 Bariatric surgery status: Secondary | ICD-10-CM | POA: Diagnosis not present

## 2018-04-03 DIAGNOSIS — E663 Overweight: Secondary | ICD-10-CM | POA: Diagnosis not present

## 2018-04-04 MED FILL — predniSONE 10 MG TABS: 10 | 6 days supply | Qty: 21 | Fill #0

## 2018-04-04 MED FILL — HYDROCODON-APAP 5-325: 5-325 | 7 days supply | Qty: 28 | Fill #0

## 2018-04-07 ENCOUNTER — Encounter: Payer: Self-pay | Admitting: Family Medicine

## 2018-04-07 ENCOUNTER — Other Ambulatory Visit: Payer: Self-pay | Admitting: Family Medicine

## 2018-04-07 DIAGNOSIS — G4700 Insomnia, unspecified: Secondary | ICD-10-CM

## 2018-04-07 MED ORDER — TEMAZEPAM 15 MG PO CAPS: 15.0000 mg | ORAL_CAPSULE | Freq: Every evening | ORAL | 2 refills | Status: DC | PRN

## 2018-04-07 NOTE — Telephone Encounter (Signed)
I sent it in for you

## 2018-04-13 ENCOUNTER — Ambulatory Visit: Payer: 59 | Admitting: Podiatry

## 2018-04-14 ENCOUNTER — Ambulatory Visit: Payer: 59 | Admitting: Family

## 2018-04-14 ENCOUNTER — Encounter: Payer: Self-pay | Admitting: Family

## 2018-04-14 VITALS — BP 143/81 | HR 85 | Temp 98.9°F | Resp 16 | Ht 71.0 in | Wt 189.0 lb

## 2018-04-14 DIAGNOSIS — M2550 Pain in unspecified joint: Secondary | ICD-10-CM | POA: Diagnosis not present

## 2018-04-14 DIAGNOSIS — F32A Depression, unspecified: Secondary | ICD-10-CM

## 2018-04-14 DIAGNOSIS — G5601 Carpal tunnel syndrome, right upper limb: Secondary | ICD-10-CM

## 2018-04-14 DIAGNOSIS — F329 Major depressive disorder, single episode, unspecified: Secondary | ICD-10-CM | POA: Diagnosis not present

## 2018-04-14 DIAGNOSIS — M130 Polyarthritis, unspecified: Secondary | ICD-10-CM | POA: Diagnosis not present

## 2018-04-14 MED ORDER — DULOXETINE HCL 30 MG PO CPEP: ORAL_CAPSULE | ORAL | 1 refills | Status: DC

## 2018-04-14 MED ORDER — HYDROCODONE-ACETAMINOPHEN 7.5-325 MG/15ML PO SOLN: 10.0000 mL | Freq: Four times a day (QID) | ORAL | 0 refills | Status: DC | PRN

## 2018-04-14 MED FILL — HYDROCOD-APAP 7.5-325/15ML: 7.5-325 | 3 days supply | Qty: 100 | Fill #0

## 2018-04-14 MED FILL — DULoxetine HCL 30 MG CPEP: 30 | 30 days supply | Qty: 60 | Fill #0

## 2018-04-14 NOTE — Progress Notes (Signed)
Subjective:    Patient ID: [name], female    DOB: 1956/03/30, 62 y.o.   MRN: [id number]  HPI  Patient is a 62 year old female with history of fibromyalgia/polyarthralgia, hypertension, hyperlipidemia, diabetes, depression/anxiety, and asthma who presents today with chief complaint of joint pain.  She saw another provider on 03/22/2018 with same complaint.  That day the patient underwent lab work which included: HLA-B 27, rheumatoid factor, ANA, ESR and CRP.  All testing was normal with the exception of an elevated sed rate.  Sed rate was 36.   of note this was also checked 9 years ago and was 34 at the time.  Saw Marella Chimes at [facility] rheumatology on 04/03/18.  She was told that she had joint inflammation and she has polyarthralgia. She is to follow up with them on Monday. Was given depomedrol 153m that day.  She reports no improvement with the Depo-Medrol.  She describes overall joint pain and muscle pain/stiffness. Reports that she is walking and feeling "like I am 89."  Reports that her bariatric surgeon says she cannot take prednisone pills but needs an elixir.  Her rheumatologist sent in pills.  She called their office and told them that she needs an elixir.  She was brought back in today to see the nurse and was given a Depo-Medrol injection.  Earlier this week she took some prednisone elixir that she had at home from previous illness and "all the pain went away."  Last dose was Wednesday AM.  She will follow back up with rheumatology on Monday.  She is planning to go out of town in the near future and hopes to be able to walk around a little bit sightsee.  Depression-she reports that she began treatment for depression around the year 2000 when her husband had some severe health issues.  She reports that her depression has remained well controlled since that time.   Review of Systems    see HPI  Past Medical History:  Diagnosis Date  . Allergic rhinitis   . Anxiety   . Asthma    hx  bronchial asthma at times with upper resp infection  . Depression   . Diabetes mellitus without complication (HCC)    diet controlled no meds  . Fibromyalgia   . GERD (gastroesophageal reflux disease)   . Headache(784.0)    migraine and cluster headaches  . Hyperlipemia   . Hypertension   . IBS (irritable bowel syndrome)   . Menopause   . Neuromuscular disorder ([place] 2009   hx of fibromyalgia, polyarthralsia (surgery induced)  . Stress incontinence    at times  . Tibial fracture 10/14/2016   evulsion periostial right     Social History   Socioeconomic History  . Marital status: Married    Spouse name: Not on file  . Number of children: Not on file  . Years of education: Not on file  . Highest education level: Not on file  Occupational History  . Occupation: RProgrammer, multimedia [employer] Social Needs  . Financial resource strain: Not on file  . Food insecurity:    Worry: Not on file    Inability: Not on file  . Transportation needs:    Medical: Not on file    Non-medical: Not on file  Tobacco Use  . Smoking status: Never Smoker  . Smokeless tobacco: Never Used  Substance and Sexual Activity  . Alcohol use: No    Alcohol/week: 0.0 standard  drinks  . Drug use: No  . Sexual activity: Yes    Birth control/protection: None  Lifestyle  . Physical activity:    Days per week: Not on file    Minutes per session: Not on file  . Stress: Not on file  Relationships  . Social connections:    Talks on phone: Not on file    Gets together: Not on file    Attends religious service: Not on file    Active member of club or organization: Not on file    Attends meetings of clubs or organizations: Not on file    Relationship status: Not on file  . Intimate partner violence:    Fear of current or ex partner: Not on file    Emotionally abused: Not on file    Physically abused: Not on file    Forced sexual activity: Not on file  Other Topics Concern  . Not on file   Social History Narrative  . Not on file    Past Surgical History:  Procedure Laterality Date  . ABDOMINAL HYSTERECTOMY  12/1999   complete  . CARPAL TUNNEL RELEASE Right 12/27/2017   Procedure: CARPAL TUNNEL RELEASE;  Surgeon: Roseanne Kaufman, MD;  Location: [facility];  Service: Orthopedics;  Laterality: Right;  . CERVICAL CONIZATION W/BX  june 1990   dysplasia of cervix/used 5Fu cream for 3 months  . CERVICAL LAMINECTOMY  2005 & 2009   X 2   NO ROM PROBLEMS  . [place]  . DILATION AND CURETTAGE OF UTERUS   [phone]   missed abortion  . ESOPHAGOGASTRODUODENOSCOPY  06/29/2011   Procedure: ESOPHAGOGASTRODUODENOSCOPY (EGD);  Surgeon: Shann Medal, MD;  Location: [facility];  Service: General;  Laterality: N/A;  . FOOT SURGERY  2005   RT HEEL  . GASTRIC BANDING PORT REVISION  09/24/2011   Procedure: GASTRIC BANDING PORT REVISION;  Surgeon: Pedro Earls, MD;  Location: [facility];  Service: General;  Laterality: N/A;  . GASTRIC ROUX-EN-Y N/A 06/06/2017   Procedure: Conversion from Sleeve to [place];  Surgeon: Johnathan Hausen, MD;  Location: [facility];  Service: General;  Laterality: N/A;  . HYSTEROSCOPY  1999  . LAPAROSCOPIC GASTRIC BANDING  12/30/09  . LAPAROSCOPIC GASTRIC SLEEVE RESECTION N/A 07/02/2013   Procedure: LAPAROSCOPIC GASTRIC SLEEVE RESECTION upper endoscopy;  Surgeon: Pedro Earls, MD;  Location: [facility];  Service: General;  Laterality: N/A;  . LAPAROSCOPIC REPAIR AND REMOVAL OF GASTRIC BAND N/A 07/02/2013   Procedure: LAPAROSCOPIC REMOVAL OF GASTRIC BAND;  Surgeon: Pedro Earls, MD;  Location: [facility];  Service: General;  Laterality: N/A;  . LAPAROSCOPIC REVISION OF GASTRIC BAND  07/03/2012   Procedure: LAPAROSCOPIC REVISION OF GASTRIC BAND;  Surgeon: Pedro Earls, MD;  Location: [facility];  Service: General;  Laterality: N/A;  removal of old lap. band port, replaced  with AP standard band  . LAPAROSCOPY  09/24/2011   Procedure: LAPAROSCOPY DIAGNOSTIC;  Surgeon: Pedro Earls, MD;  Location: [facility];  Service: General;  Laterality: N/A;  . LAPAROSCOPY  07/03/2012   Procedure: LAPAROSCOPY DIAGNOSTIC;  Surgeon: Pedro Earls, MD;  Location: [facility];  Service: General;  Laterality: N/A;  Exploratory Laparoscopy   . MESH APPLIED TO LAP PORT  07/03/2012   Procedure: MESH APPLIED TO LAP PORT;  Surgeon: Pedro Earls, MD;  Location: [facility];  Service: General;  Laterality: N/A;  . [place]   X 2  . right knee  1981   ARTHROSCOPY AND ARTHROTOMY  . right knee arthroscopy and arthrotomy  12-1979  . TONSILLECTOMY  1971  . TUBAL LIGATION  1993   WITH C -SECTION    Family History  Problem Relation Age of Onset  . Diabetes Mother   . Stroke Mother   . Breast cancer Mother   . Atrial fibrillation Mother   . Hypertension Mother   . Cancer Mother        breast  . Diabetes Father   . Stroke Father   . Hypertension Father   . Heart attack Father   . Thyroid disease Unknown   . Heart disease Unknown   . COPD Other     Allergies  Allergen Reactions  . Isoniazid     Severe SOB  . Nitrofurantoin Shortness Of Breath    REACTION: welts  . Hctz [Hydrochlorothiazide]     rash  . Promethazine Hcl [Promethazine Hcl]     IF GIVEN  IV-hallucinations     CAN TAKE PO PHENERGAN  . Tamiflu [Oseltamivir Phosphate] Nausea And Vomiting    "I vomited within 30 minutes of taking it and was told I cannot ever take it again."  . Macrolides And Ketolides     rash  . Morphine Other (See Comments)    severe vomiting  . Percocet [Oxycodone-Acetaminophen]     REACTION: severe itching. Can take by mouth codeine  . Roxicet [Oxycodone-Acetaminophen]     itching  . Sulfamethoxazole-Trimethoprim     Septra / Bactrim  --REACTION: rash  . Fentanyl Hives and Rash    REACTION: welts    Current Outpatient Medications on File Prior to Visit    Medication Sig Dispense Refill  . acetaminophen (TYLENOL 8 HOUR ARTHRITIS PAIN) 650 MG CR tablet Take 650 mg by mouth 2 (two) times daily as needed for pain.    . Blood Glucose Monitoring Suppl (BAYER CONTOUR LINK MONITOR) W/DEVICE KIT Test blood sugar once daily DX code: E11.9 1 kit 1  . Ca Phosphate-Cholecalciferol (CALTRATE GUMMY BITES PO) Take 2 each 2 (two) times daily by mouth.     . citalopram (CELEXA) 20 MG tablet TAKE 1 TABLET (20 MG TOTAL) BY MOUTH EVERY EVENING. 90 tablet 3  . cyclobenzaprine (FLEXERIL) 10 MG tablet TAKE 1 TABLET (10 MG TOTAL) BY MOUTH 3 (THREE) TIMES DAILY AS NEEDED FOR MUSCLE SPASMS. 30 tablet 0  . Diclofenac Sodium (PENNSAID) 2 % SOLN Place 1 application onto the skin 2 (two) times daily as needed (pain).    Marland Kitchen estradiol (VIVELLE-DOT) 0.1 MG/24HR Place 1 patch (0.1 mg total) onto the skin 2 (two) times a week. Monday and Thursday (Patient taking differently: Place 1 patch onto the skin 2 (two) times a week. Mondays and Thursdays) 8 patch 11  . furosemide (LASIX) 20 MG tablet TAKE 1 TABLET BY MOUTH EVERY MORNING 90 tablet 1  . glucose blood (ONETOUCH VERIO) test strip Check blood sugar daily 100 each 12  . glucose blood test strip Test blood sugar once daily. Dx Code: E11.9 100 each 12  . HYDROcodone-acetaminophen (HYCET) 7.5-325 mg/15 ml solution Take 10 mLs by mouth 4 (four) times daily as needed for moderate pain. 120 mL 0  . loratadine (CLARITIN) 10 MG tablet Take 10 mg by mouth every evening.     . methocarbamol (ROBAXIN) 500 MG tablet Take 1 tablet (500 mg total)  by mouth 4 (four) times daily. (Patient taking differently: Take 500 mg by mouth 4 (four) times daily as needed (for spasms.). ) 10 tablet 1  . olmesartan (BENICAR) 5 MG tablet Take 1 tablet (5 mg total) by mouth daily. 90 tablet 3  . ONETOUCH DELICA LANCETS FINE MISC check blood sugar daily 100 each 12  . Pediatric Multivit-Minerals-C (ONE-A-DAY SCOOBY-DOO GUMMIES PO) Take 1 tablet 2 (two) times daily by  mouth.     . potassium chloride (K-DUR,KLOR-CON) 10 MEQ tablet TAKE 1 TABLET (10 MEQ TOTAL) BY MOUTH DAILY. (Patient taking differently: Take 10 mEq by mouth every evening. ) 90 tablet 1  . PREMARIN vaginal cream Apply 1 application topically 2 (two) times a week. Mondays & Thursdays.  1  . rosuvastatin (CRESTOR) 20 MG tablet TAKE 1 TABLET (20 MG TOTAL) BY MOUTH AT BEDTIME. 90 tablet 1  . temazepam (RESTORIL) 15 MG capsule Take 1 capsule (15 mg total) by mouth at bedtime as needed for sleep. 30 capsule 2  . temazepam (RESTORIL) 30 MG capsule Take 30 mg by mouth at bedtime as needed for sleep.  0  . thiamine (VITAMIN B-1) 100 MG tablet Take 100 mg daily by mouth.    . traZODone (DESYREL) 50 MG tablet Take 0.5-1 tablets (25-50 mg total) by mouth at bedtime as needed for sleep. 30 tablet 3   No current facility-administered medications on file prior to visit.     BP (!) 143/81 (BP Location: Left Arm, Patient Position: Sitting, Cuff Size: Small)   Pulse 85   Temp 98.9 F (37.2 C) (Oral)   Resp 16   Ht '5\' 11"'  (1.803 m)   Wt 189 lb (85.7 kg)   SpO2 96%   BMI 26.36 kg/m    Objective:   Physical Exam  Constitutional: She is oriented to person, place, and time. She appears well-developed and well-nourished.  Cardiovascular: Normal rate, regular rhythm and normal heart sounds.  No murmur heard. Pulmonary/Chest: Effort normal and breath sounds normal. No respiratory distress. She has no wheezes.  Musculoskeletal:  Swelling noted of finger joints  Neurological: She is alert and oriented to person, place, and time.  Skin: Skin is warm and dry.  Psychiatric: She has a normal mood and affect. Her behavior is normal. Judgment and thought content normal.          Assessment & Plan:  Polyarthralgia- she received a Depo-Medrol Medrol injection today from her rheumatologist.  I have advised her to keep the upcoming appointment with her rheumatologist on Monday to discuss alternative treatments.   She is concerned that she could have an underlying illness such as Lyme disease.  I did order a Lyme titer as well as a [place] spotted fever test today.  In addition I suspect that there is also an overlay of fibromyalgia.   Depression- She is maintained on citalopram, and I would like to try to transition her off of the citalopram and start her on Cymbalta to see if this helps some with her fibromyalgia pain as well as depression.  She is advised to let us know if she has any issues with mood during this transition time.  She is advised to follow-up with her PCP in 1 month.  For acute pain over the weekend I have given her a one-time refill of her hydrocodone liquid.  I will avoid use of tramadol due to potential drug interaction with the citalopram and Cymbalta.  We also discussed considering adding thousand milligrams  of Tylenol twice a day standing, and not to exceed a total of 3000 mg daily while using hydrocodone/acetaminophen syrup.  She verbalizes understanding.

## 2018-04-14 NOTE — Patient Instructions (Signed)
Cymbalta 30mg  once daily for 1 week then increase to 2 caps once daily on week two. Citalopram 1/2 tab once daily for 2 weeks then stop.

## 2018-04-15 LAB — LYME AB/WESTERN BLOT REFLEX
LYME DISEASE AB, QUANT, IGM: <0.8 index (ref 0.00–0.79)
Lyme IgG/IgM Ab: <0.91 {ISR} (ref 0.00–0.90)

## 2018-04-17 DIAGNOSIS — Z6826 Body mass index (BMI) 26.0-26.9, adult: Secondary | ICD-10-CM | POA: Diagnosis not present

## 2018-04-17 DIAGNOSIS — M0609 Rheumatoid arthritis without rheumatoid factor, multiple sites: Secondary | ICD-10-CM | POA: Diagnosis not present

## 2018-04-17 DIAGNOSIS — M130 Polyarthritis, unspecified: Secondary | ICD-10-CM | POA: Diagnosis not present

## 2018-04-17 DIAGNOSIS — Z9884 Bariatric surgery status: Secondary | ICD-10-CM | POA: Diagnosis not present

## 2018-04-17 DIAGNOSIS — E663 Overweight: Secondary | ICD-10-CM | POA: Diagnosis not present

## 2018-04-17 DIAGNOSIS — M2550 Pain in unspecified joint: Secondary | ICD-10-CM | POA: Diagnosis not present

## 2018-04-18 ENCOUNTER — Telehealth: Payer: Self-pay | Admitting: Pharmacist

## 2018-04-18 LAB — ROCKY MTN SPOTTED FVR ABS PNL(IGG+IGM)
RMSF IgG: NOT DETECTED
RMSF IgM: NOT DETECTED

## 2018-04-18 NOTE — Telephone Encounter (Signed)
Called patient to schedule an appointment for the [facility]. I was unable to reach the patient so I left a HIPAA-compliant message requesting that the patient return my call.   

## 2018-04-19 ENCOUNTER — Encounter: Payer: Self-pay | Admitting: Family Medicine

## 2018-04-20 MED FILL — predniSONE 5 MG/5ML SOLN: 5 | 26 days supply | Qty: 780 | Fill #0

## 2018-04-26 ENCOUNTER — Ambulatory Visit (INDEPENDENT_AMBULATORY_CARE_PROVIDER_SITE_OTHER): Payer: 59 | Admitting: Pharmacist

## 2018-04-26 DIAGNOSIS — Z79899 Other long term (current) drug therapy: Secondary | ICD-10-CM

## 2018-04-26 MED ORDER — ADALIMUMAB 40 MG/0.4ML ~~LOC~~ AJKT: 1.0000 "pen " | AUTO-INJECTOR | SUBCUTANEOUS | 5 refills | Status: DC

## 2018-04-26 NOTE — Progress Notes (Signed)
   S: Patient presents to Patient [facility] for review of their specialty medication therapy.  Patient is currently taking Humira for rheumatoid arthritis. Patient is managed by Marella Chimes for this.   Adherence: has only had one dose  Efficacy: has had one dose, has not noticed a difference yet. She is currently tapering off of prednisone.   Dosing:  Rheumatoid arthritis: SubQ: 40 mg every other week   Screening: TB test: completed per patient Hepatitis: completed per patient  Monitoring: S/sx of infection: reports elevated WBC but no s/sx of infection, followed by rheumatology  CBC: see below S/sx of hypersensitivity: denies S/sx of malignancy: denies S/sx of heart failure: denies Other adverse effects: reports some nausea after the first dose but it has resolved. Of note, patient has had gastric bypass surgery.   O:     Lab Results  Component Value Date   WBC 12.9 (H) 12/26/2017   HGB 13.4 12/26/2017   HCT 42.6 12/26/2017   MCV 85.2 12/26/2017   PLT 342 12/26/2017      Chemistry      Component Value Date/Time   NA 136 12/26/2017 0826   K 3.9 12/26/2017 0826   CL 100 (L) 12/26/2017 0826   CO2 28 12/26/2017 0826   BUN 13 12/26/2017 0826   CREATININE 0.83 12/26/2017 0826      Component Value Date/Time   CALCIUM 9.3 12/26/2017 0826   ALKPHOS 67 10/06/2017 0917   AST 14 10/06/2017 0917   ALT 14 10/06/2017 0917   BILITOT 0.6 10/06/2017 0917       A/P: 1. Medication review: Patient currently on Humira for rheumatoid arthritis. Reviewed the medication with the patient, including the following: Humira is a TNF blocking agent indicated for ankylosing spondylitis, Crohn's disease, Hidradenitis suppurativa, psoriatic arthritis, plaque psoriasis, ulcerative colitis, and uveitis. Patient educated on purpose, proper use and potential adverse effects of Humira. The most common adverse effects are infections, headache, and injection site reactions. There is the possibility  of an increased risk of malignancy but it is not well understood if this increased risk is due to there medication or the disease state. There are rare cases of pancytopenia and aplastic anemia. No recommendations for any changes at this time.   Christella Hartigan, PharmD, BCPS, BCACP, CPP Clinical Pharmacist Practitioner  [phone]

## 2018-05-01 ENCOUNTER — Ambulatory Visit: Payer: 59 | Admitting: Podiatry

## 2018-05-01 ENCOUNTER — Encounter: Payer: Self-pay | Admitting: Podiatry

## 2018-05-01 DIAGNOSIS — M79671 Pain in right foot: Secondary | ICD-10-CM

## 2018-05-01 DIAGNOSIS — M79672 Pain in left foot: Secondary | ICD-10-CM

## 2018-05-01 DIAGNOSIS — B351 Tinea unguium: Secondary | ICD-10-CM

## 2018-05-01 MED FILL — HUMIRA PEN 40 MG/0.4ML PNKT: 40 | 28 days supply | Qty: 2 | Fill #0

## 2018-05-01 NOTE — Progress Notes (Signed)
Subjective: 62 y.o. year old female patient presents complaining of painful nails. Patient requests toe nails, corns and calluses trimmed.  Stated that she was recently diagnosed with Rheumatoid arthritis.  Objective: Dermatologic: Thick yellow deformed nails x 10. Ingrown hallucal nails both great toes.  Excess keratosis ungual labia 2nd toe nail right. Vascular: Pedal pulses are all palpable. Orthopedic: Contracted 2nd toe right. Neurologic: Diagnosed with Diabetic polyneuropathy.  Assessment: Dystrophic mycotic nails x 10. Digital corn 2nd right. Painful nails.  Treatment: All mycotic nails, corns, calluses debrided.  Return as needed.

## 2018-05-01 NOTE — Patient Instructions (Signed)
Seen for hypertrophic nails. All nails and corns debrided. Return  as needed.

## 2018-05-02 ENCOUNTER — Encounter: Payer: Self-pay | Admitting: Family Medicine

## 2018-05-02 ENCOUNTER — Ambulatory Visit: Payer: 59 | Admitting: Family Medicine

## 2018-05-02 VITALS — BP 136/70 | HR 67 | Temp 98.0°F | Resp 16 | Ht 71.0 in | Wt 189.2 lb

## 2018-05-02 DIAGNOSIS — I10 Essential (primary) hypertension: Secondary | ICD-10-CM

## 2018-05-02 DIAGNOSIS — M5412 Radiculopathy, cervical region: Secondary | ICD-10-CM | POA: Diagnosis not present

## 2018-05-02 DIAGNOSIS — E118 Type 2 diabetes mellitus with unspecified complications: Secondary | ICD-10-CM | POA: Diagnosis not present

## 2018-05-02 DIAGNOSIS — E785 Hyperlipidemia, unspecified: Secondary | ICD-10-CM | POA: Diagnosis not present

## 2018-05-02 LAB — LIPID PANEL
Cholesterol: 135 mg/dL (ref 0–200)
HDL: 69.1 mg/dL (ref >39.00)
LDL Cholesterol: 48 mg/dL (ref 0–99)
NonHDL: 65.4
Total CHOL/HDL Ratio: 2
Triglycerides: 86 mg/dL (ref 0.0–149.0)
VLDL: 17.2 mg/dL (ref 0.0–40.0)

## 2018-05-02 LAB — COMPREHENSIVE METABOLIC PANEL
ALT: 29 U/L (ref 0–35)
AST: 16 U/L (ref 0–37)
Albumin: 4 g/dL (ref 3.5–5.2)
Alkaline Phosphatase: 63 U/L (ref 39–117)
BUN: 9 mg/dL (ref 6–23)
CO2: 34 mEq/L — ABNORMAL HIGH (ref 19–32)
Calcium: 9.4 mg/dL (ref 8.4–10.5)
Chloride: 102 mEq/L (ref 96–112)
Creatinine, Ser: 0.73 mg/dL (ref 0.40–1.20)
GFR: 86.05 mL/min (ref >60.00)
Glucose, Bld: 87 mg/dL (ref 70–99)
Potassium: 4.9 mEq/L (ref 3.5–5.1)
Sodium: 139 mEq/L (ref 135–145)
Total Bilirubin: 0.6 mg/dL (ref 0.2–1.2)
Total Protein: 6.7 g/dL (ref 6.0–8.3)

## 2018-05-02 LAB — HEMOGLOBIN A1C: Hgb A1c MFr Bld: 6.1 % (ref 4.6–6.5)

## 2018-05-02 MED ORDER — CYCLOBENZAPRINE HCL 10 MG PO TABS: 10.0000 mg | ORAL_TABLET | Freq: Three times a day (TID) | ORAL | 0 refills | Status: DC | PRN

## 2018-05-02 MED ORDER — POTASSIUM CHLORIDE CRYS ER 10 MEQ PO TBCR: EXTENDED_RELEASE_TABLET | ORAL | 1 refills | Status: DC

## 2018-05-02 MED ORDER — ROSUVASTATIN CALCIUM 20 MG PO TABS: ORAL_TABLET | ORAL | 1 refills | Status: DC

## 2018-05-02 MED ORDER — FUROSEMIDE 20 MG PO TABS: ORAL_TABLET | ORAL | 1 refills | Status: DC

## 2018-05-02 MED ORDER — METHOCARBAMOL 500 MG PO TABS: 500.0000 mg | ORAL_TABLET | Freq: Four times a day (QID) | ORAL | 1 refills | Status: DC

## 2018-05-02 MED ORDER — OLMESARTAN MEDOXOMIL 5 MG PO TABS: 5.0000 mg | ORAL_TABLET | Freq: Every day | ORAL | 3 refills | Status: DC

## 2018-05-02 MED FILL — FUROSEMIDE 20 MG TAB: 20 | 90 days supply | Qty: 90 | Fill #0

## 2018-05-02 MED FILL — ROSUVASTATIN CALCIUM 20 MG: 20 | 90 days supply | Qty: 90 | Fill #0

## 2018-05-02 MED FILL — CYCLOBENZAPRINE HCL 10 MG T: 10 | 10 days supply | Qty: 30 | Fill #0

## 2018-05-02 MED FILL — POTASSIUM CHL ER M10 TABLET: 10 | 90 days supply | Qty: 90 | Fill #0

## 2018-05-02 MED FILL — METHOCARBAMOL 500 MG TABLET: 500 | 2 days supply | Qty: 10 | Fill #0

## 2018-05-02 NOTE — Assessment & Plan Note (Signed)
hgba1c to be checked, minimize simple carbs. Increase exercise as tolerated. Continue current meds  

## 2018-05-02 NOTE — Assessment & Plan Note (Signed)
Tolerating statin, encouraged heart healthy diet, avoid trans fats, minimize simple carbs and saturated fats. Increase exercise as tolerated 

## 2018-05-02 NOTE — Patient Instructions (Signed)

## 2018-05-02 NOTE — Assessment & Plan Note (Signed)
Well controlled, no changes to meds. Encouraged heart healthy diet such as the DASH diet and exercise as tolerated.  °

## 2018-05-02 NOTE — Progress Notes (Signed)
Patient ID: [name], female    DOB: 1956/07/10  Age: 62 y.o. MRN: [id number]    Subjective:  Subjective  HPI [name] presents for f/u dm, lipid and bp.  She is off the pred now and is getting humira--- second dose is sat.     HPI HYPERTENSION   Blood pressure range-not checking   Chest pain- no      Dyspnea- no Lightheadedness- no   Edema- no  Other side effects - no   Medication compliance: good Low salt diet- yes    DIABETES    Blood Sugar ranges-140 is highest   Polyuria- no New Visual problems- no  Hypoglycemic symptoms- no  Other side effects-no Medication compliance - good Last eye exam- Aug 2019 Foot exam-yesterday-- podiatry   HYPERLIPIDEMIA  Medication compliance- good RUQ pain- no  Muscle aches- no Other side effects-n0   Review of Systems  Constitutional: Negative for activity change, appetite change, chills and fever.  HENT: Negative for congestion and hearing loss.   Eyes: Negative for discharge.  Respiratory: Negative for cough and shortness of breath.   Cardiovascular: Negative for chest pain, palpitations and leg swelling.  Gastrointestinal: Negative for abdominal distention, abdominal pain, blood in stool, constipation, diarrhea, nausea and vomiting.  Genitourinary: Negative for difficulty urinating, dyspareunia, dysuria, flank pain, frequency, genital sores, hematuria, menstrual problem, pelvic pain, urgency, vaginal discharge and vaginal pain.  Musculoskeletal: Negative for back pain and myalgias.  Skin: Negative for rash.  Allergic/Immunologic: Negative for environmental allergies.  Neurological: Negative for dizziness, weakness and headaches.  Hematological: Does not bruise/bleed easily.  Psychiatric/Behavioral: Negative for suicidal ideas. The patient is not nervous/anxious.     History Past Medical History:  Diagnosis Date  . Allergic rhinitis   . Anxiety   . Asthma    hx bronchial asthma at times with upper resp  infection  . Depression   . Diabetes mellitus without complication (HCC)    diet controlled no meds  . Fibromyalgia   . GERD (gastroesophageal reflux disease)   . Headache(784.0)    migraine and cluster headaches  . Hyperlipemia   . Hypertension   . IBS (irritable bowel syndrome)   . Menopause   . Neuromuscular disorder ([place]) 2009   hx of fibromyalgia, polyarthralsia (surgery induced)  . Stress incontinence    at times  . Tibial fracture 10/14/2016   evulsion periostial right    She has a past surgical history that includes Cesarean section ([place]); Cervical laminectomy (2005 & 2009); Foot surgery (2005); right knee (1981); Nasal sinus surgery ([place]); Laparoscopic gastric banding (12/30/09); Esophagogastroduodenoscopy (06/29/2011); Hysteroscopy (1999); laparoscopy (09/24/2011); Gastric banding port revision (09/24/2011); Tonsillectomy (1971); Cholecystectomy (1986); Tubal ligation (1993); right knee arthroscopy and arthrotomy (0-1093); Cervical conization w/bx (june 1990); laparoscopy (07/03/2012); Laparoscopic revision of gastric band (07/03/2012); Mesh applied to lap port (07/03/2012); Dilation and curettage of uterus ( april-1989); Laparoscopic repair and removal of gastric band (N/A, 07/02/2013); Laparoscopic gastric sleeve resection (N/A, 07/02/2013); Abdominal hysterectomy (12/1999); Gastric Roux-En-Y (N/A, 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   Her family history includes Atrial fibrillation in her mother; Breast cancer in her mother; COPD in her other; Cancer in her mother; Diabetes in her father and mother; Heart attack in her father; Heart disease in her unknown relative; Hypertension in her father and mother; Stroke in her father and mother; Thyroid disease in her unknown relative.She reports that she has never smoked. She has never used smokeless tobacco. She  reports that she does not drink alcohol or use drugs.  Current Outpatient Medications on  File Prior to Visit  Medication Sig Dispense Refill  . acetaminophen (TYLENOL 8 HOUR ARTHRITIS PAIN) 650 MG CR tablet Take 650 mg by mouth 2 (two) times daily as needed for pain.    . Adalimumab (HUMIRA PEN) 40 MG/0.4ML PNKT Inject 1 pen into the skin every 14 (fourteen) days. Inject 1 injection every other week under the skin 2 each 5  . Blood Glucose Monitoring Suppl (BAYER CONTOUR LINK MONITOR) W/DEVICE KIT Test blood sugar once daily DX code: E11.9 1 kit 1  . Ca Phosphate-Cholecalciferol (CALTRATE GUMMY BITES PO) Take 2 each 2 (two) times daily by mouth.     . Diclofenac Sodium (PENNSAID) 2 % SOLN Place 1 application onto the skin 2 (two) times daily as needed (pain).    . DULoxetine (CYMBALTA) 30 MG capsule Take 1 cap by mouth for 1 week then increase to 2 caps by mouth on week two 60 capsule 1  . estradiol (VIVELLE-DOT) 0.1 MG/24HR Place 1 patch (0.1 mg total) onto the skin 2 (two) times a week. Monday and Thursday (Patient taking differently: Place 1 patch onto the skin 2 (two) times a week. Mondays and Thursdays) 8 patch 11  . glucose blood (ONETOUCH VERIO) test strip Check blood sugar daily 100 each 12  . glucose blood test strip Test blood sugar once daily. Dx Code: E11.9 100 each 12  . HYDROcodone-acetaminophen (HYCET) 7.5-325 mg/15 ml solution Take 10 mLs by mouth 4 (four) times daily as needed for moderate pain. 100 mL 0  . loratadine (CLARITIN) 10 MG tablet Take 10 mg by mouth every evening.     [name] DELICA LANCETS FINE MISC check blood sugar daily 100 each 12  . Pediatric Multivit-Minerals-C (ONE-A-DAY SCOOBY-DOO GUMMIES PO) Take 1 tablet 2 (two) times daily by mouth.     Marland Kitchen PREMARIN vaginal cream Apply 1 application topically 2 (two) times a week. Mondays & Thursdays.  1  . temazepam (RESTORIL) 15 MG capsule Take 1 capsule (15 mg total) by mouth at bedtime as needed for sleep. 30 capsule 2  . thiamine (VITAMIN B-1) 100 MG tablet Take 100 mg daily by mouth.     No current  facility-administered medications on file prior to visit.      Objective:  Objective  Physical Exam  Constitutional: She is oriented to person, place, and time. She appears well-developed and well-nourished.  HENT:  Head: Normocephalic and atraumatic.  Eyes: Conjunctivae and EOM are normal.  Neck: Normal range of motion. Neck supple. No JVD present. Carotid bruit is not present. No thyromegaly present.  Cardiovascular: Normal rate, regular rhythm and normal heart sounds.  No murmur heard. Pulmonary/Chest: Effort normal and breath sounds normal. No respiratory distress. She has no wheezes. She has no rales. She exhibits no tenderness.  Musculoskeletal: She exhibits no edema.  Neurological: She is alert and oriented to person, place, and time.  Psychiatric: She has a normal mood and affect.   BP 136/70 (BP Location: Left Arm, Cuff Size: Normal)   Pulse 67   Temp 98 F (36.7 C) (Oral)   Resp 16   Ht '5\' 11"'  (1.803 m)   Wt 189 lb 3.2 oz (85.8 kg)   SpO2 98%   BMI 26.39 kg/m  Wt Readings from Last 3 Encounters:  05/02/18 189 lb 3.2 oz (85.8 kg)  04/14/18 189 lb (85.7 kg)  03/22/18 189 lb 3.2 oz (85.8  kg)     Lab Results  Component Value Date   WBC 12.9 (H) 12/26/2017   HGB 13.4 12/26/2017   HCT 42.6 12/26/2017   PLT 342 12/26/2017   GLUCOSE 93 12/26/2017   CHOL 162 10/06/2017   TRIG 91.0 10/06/2017   HDL 59.50 10/06/2017   LDLCALC 84 10/06/2017   ALT 14 10/06/2017   AST 14 10/06/2017   NA 136 12/26/2017   K 3.9 12/26/2017   CL 100 (L) 12/26/2017   CREATININE 0.83 12/26/2017   BUN 13 12/26/2017   CO2 28 12/26/2017   TSH 1.87 12/24/2013   HGBA1C 5.8 (H) 12/26/2017   MICROALBUR 0.1 08/01/2014    No results found.   Assessment & Plan:  Plan  I have discontinued Charmaine Downs. [name]'s traZODone. I am also having her maintain her loratadine, Ca Phosphate-Cholecalciferol (CALTRATE GUMMY BITES PO), Pediatric Multivit-Minerals-C (ONE-A-DAY SCOOBY-DOO GUMMIES PO),  estradiol, ONETOUCH DELICA LANCETS FINE, BAYER CONTOUR LINK MONITOR, glucose blood, PREMARIN, glucose blood, thiamine, acetaminophen, Diclofenac Sodium, temazepam, DULoxetine, HYDROcodone-acetaminophen, Adalimumab, olmesartan, cyclobenzaprine, methocarbamol, rosuvastatin, furosemide, and potassium chloride.  Meds ordered this encounter  Medications  . olmesartan (BENICAR) 5 MG tablet    Sig: Take 1 tablet (5 mg total) by mouth daily.    Dispense:  90 tablet    Refill:  3  . cyclobenzaprine (FLEXERIL) 10 MG tablet    Sig: Take 1 tablet (10 mg total) by mouth 3 (three) times daily as needed for muscle spasms.    Dispense:  30 tablet    Refill:  0  . methocarbamol (ROBAXIN) 500 MG tablet    Sig: Take 1 tablet (500 mg total) by mouth 4 (four) times daily.    Dispense:  10 tablet    Refill:  1  . rosuvastatin (CRESTOR) 20 MG tablet    Sig: TAKE 1 TABLET (20 MG TOTAL) BY MOUTH AT BEDTIME.    Dispense:  90 tablet    Refill:  1  . furosemide (LASIX) 20 MG tablet    Sig: TAKE 1 TABLET BY MOUTH EVERY MORNING    Dispense:  90 tablet    Refill:  1  . potassium chloride (K-DUR,KLOR-CON) 10 MEQ tablet    Sig: TAKE 1 TABLET (10 MEQ TOTAL) BY MOUTH DAILY.    Dispense:  90 tablet    Refill:  1    Problem List Items Addressed This Visit      Unprioritized   Cervical radiculopathy   Relevant Medications   cyclobenzaprine (FLEXERIL) 10 MG tablet   methocarbamol (ROBAXIN) 500 MG tablet   Controlled type 2 diabetes mellitus with complication, without long-term current use of insulin (HCC) - Primary    hgba1c to be checked, minimize simple carbs. Increase exercise as tolerated. Continue current meds      Relevant Medications   olmesartan (BENICAR) 5 MG tablet   rosuvastatin (CRESTOR) 20 MG tablet   Other Relevant Orders   Hemoglobin A1c   Comprehensive metabolic panel   Essential hypertension    Well controlled, no changes to meds. Encouraged heart healthy diet such as the DASH diet and  exercise as tolerated.       Relevant Medications   olmesartan (BENICAR) 5 MG tablet   rosuvastatin (CRESTOR) 20 MG tablet   furosemide (LASIX) 20 MG tablet   potassium chloride (K-DUR,KLOR-CON) 10 MEQ tablet   Hyperlipidemia   Relevant Medications   olmesartan (BENICAR) 5 MG tablet   rosuvastatin (CRESTOR) 20 MG tablet   furosemide (LASIX) 20 MG  tablet   Other Relevant Orders   Comprehensive metabolic panel   Lipid panel   Hyperlipidemia LDL goal <100    Tolerating statin, encouraged heart healthy diet, avoid trans fats, minimize simple carbs and saturated fats. Increase exercise as tolerated      Relevant Medications   olmesartan (BENICAR) 5 MG tablet   rosuvastatin (CRESTOR) 20 MG tablet   furosemide (LASIX) 20 MG tablet      Follow-up: Return in about 6 months (around 11/01/2018), or if symptoms worsen or fail to improve, for hypertension, hyperlipidemia, diabetes II.  [name], DO

## 2018-05-03 NOTE — Addendum Note (Signed)
Addended byDamita Dunnings D on: 05/03/2018 01:32 PM   Modules accepted: Orders

## 2018-05-04 MED FILL — OLMESARTAN MEDOXOMIL 5 MG T: 5 | 90 days supply | Qty: 90 | Fill #0

## 2018-05-05 MED FILL — TEMAZEPAM 15 MG CAPSULE: 15 | 30 days supply | Qty: 30 | Fill #1

## 2018-05-09 ENCOUNTER — Encounter: Payer: Self-pay | Admitting: Family Medicine

## 2018-05-23 MED FILL — HUMIRA PEN 40 MG/0.4ML PNKT: 40 | 28 days supply | Qty: 2 | Fill #1

## 2018-05-24 DIAGNOSIS — Z09 Encounter for follow-up examination after completed treatment for conditions other than malignant neoplasm: Secondary | ICD-10-CM | POA: Diagnosis not present

## 2018-06-01 ENCOUNTER — Encounter: Payer: Self-pay | Admitting: Family Medicine

## 2018-06-01 ENCOUNTER — Ambulatory Visit: Payer: 59 | Admitting: Family Medicine

## 2018-06-01 DIAGNOSIS — M25512 Pain in left shoulder: Secondary | ICD-10-CM

## 2018-06-01 DIAGNOSIS — M25511 Pain in right shoulder: Secondary | ICD-10-CM | POA: Diagnosis not present

## 2018-06-01 MED ORDER — NITROGLYCERIN 0.2 MG/HR TD PT24: MEDICATED_PATCH | TRANSDERMAL | 1 refills | Status: DC

## 2018-06-01 MED ORDER — METHYLPREDNISOLONE ACETATE 40 MG/ML IJ SUSP
40.0000 mg | Freq: Once | INTRAMUSCULAR | Status: AC
  Administered 2018-06-01: 40 mg via INTRA_ARTICULAR

## 2018-06-01 MED FILL — NITROGLYCERIN 0.2 MG/HR PAT: 0.2 | 84 days supply | Qty: 21 | Fill #0

## 2018-06-01 NOTE — Patient Instructions (Signed)
You have rotator cuff impingement Try to avoid painful activities (overhead activities, lifting with extended arm) as much as possible. Salon pas patches, topical aspercreme as needed. Can take tylenol in addition to this. Subacromial injection may be beneficial to help with pain and to decrease inflammation - you were given these today. Consider physical therapy with transition to home exercise program. Do home exercise program with theraband and scapular stabilization exercises daily 3 sets of 10 once a day. Nitro patches 1/4th patch to right shoulder, change daily.  After a few days if tolerated you can also place 1/4th patch on the left shoulder, change daily If not improving at follow-up we will consider further imaging, physical therapy. Follow up with me in 6 weeks.

## 2018-06-02 ENCOUNTER — Encounter: Payer: Self-pay | Admitting: Family Medicine

## 2018-06-02 NOTE — Progress Notes (Signed)
PCP: [name], DO  Subjective:   HPI: Patient is a 62 y.o. female here for bilateral shoulder pain.  Patient reports she's had off and on bilateral shoulder pain since February. She was having multiple other joint pains as well, diagnosed with RA and started on humira. This has helped most of her joint issues but shoulder pain persists. Pain right shoulder 9/10 and sharp, 5/10 on left. + night pain. Worse reaching behind and with cold weather. Thinks this occurred related to lifting boxes occasionally at work. She had injections back in June which resolved her pain but it has come back. She's taking robaxin, using heat. No skin changes, numbness.  Past Medical History:  Diagnosis Date  . Allergic rhinitis   . Anxiety   . Asthma    hx bronchial asthma at times with upper resp infection  . Depression   . Diabetes mellitus without complication (HCC)    diet controlled no meds  . Fibromyalgia   . GERD (gastroesophageal reflux disease)   . Headache(784.0)    migraine and cluster headaches  . Hyperlipemia   . Hypertension   . IBS (irritable bowel syndrome)   . Menopause   . Neuromuscular disorder ([place]) 2009   hx of fibromyalgia, polyarthralsia (surgery induced)  . Stress incontinence    at times  . Tibial fracture 10/14/2016   evulsion periostial right    Current Outpatient Medications on File Prior to Visit  Medication Sig Dispense Refill  . acetaminophen (TYLENOL 8 HOUR ARTHRITIS PAIN) 650 MG CR tablet Take 650 mg by mouth 2 (two) times daily as needed for pain.    . Adalimumab (HUMIRA PEN) 40 MG/0.4ML PNKT Inject 1 pen into the skin every 14 (fourteen) days. Inject 1 injection every other week under the skin 2 each 5  . Blood Glucose Monitoring Suppl (BAYER CONTOUR LINK MONITOR) W/DEVICE KIT Test blood sugar once daily DX code: E11.9 1 kit 1  . Ca Phosphate-Cholecalciferol (CALTRATE GUMMY BITES PO) Take 2 each 2 (two) times daily by mouth.     .  cyclobenzaprine (FLEXERIL) 10 MG tablet Take 1 tablet (10 mg total) by mouth 3 (three) times daily as needed for muscle spasms. 30 tablet 0  . Diclofenac Sodium (PENNSAID) 2 % SOLN Place 1 application onto the skin 2 (two) times daily as needed (pain).    . DULoxetine (CYMBALTA) 30 MG capsule Take 1 cap by mouth for 1 week then increase to 2 caps by mouth on week two 60 capsule 1  . estradiol (VIVELLE-DOT) 0.1 MG/24HR Place 1 patch (0.1 mg total) onto the skin 2 (two) times a week. Monday and Thursday (Patient taking differently: Place 1 patch onto the skin 2 (two) times a week. Mondays and Thursdays) 8 patch 11  . furosemide (LASIX) 20 MG tablet TAKE 1 TABLET BY MOUTH EVERY MORNING 90 tablet 1  . glucose blood (ONETOUCH VERIO) test strip Check blood sugar daily 100 each 12  . glucose blood test strip Test blood sugar once daily. Dx Code: E11.9 100 each 12  . HYDROcodone-acetaminophen (HYCET) 7.5-325 mg/15 ml solution Take 10 mLs by mouth 4 (four) times daily as needed for moderate pain. 100 mL 0  . loratadine (CLARITIN) 10 MG tablet Take 10 mg by mouth every evening.     . methocarbamol (ROBAXIN) 500 MG tablet Take 1 tablet (500 mg total) by mouth 4 (four) times daily. 10 tablet 1  . olmesartan (BENICAR) 5 MG tablet Take 1 tablet (5  mg total) by mouth daily. 90 tablet 3  . ONETOUCH DELICA LANCETS FINE MISC check blood sugar daily 100 each 12  . Pediatric Multivit-Minerals-C (ONE-A-DAY SCOOBY-DOO GUMMIES PO) Take 1 tablet 2 (two) times daily by mouth.     . potassium chloride (K-DUR,KLOR-CON) 10 MEQ tablet TAKE 1 TABLET (10 MEQ TOTAL) BY MOUTH DAILY. 90 tablet 1  . PREMARIN vaginal cream Apply 1 application topically 2 (two) times a week. Mondays & Thursdays.  1  . rosuvastatin (CRESTOR) 20 MG tablet TAKE 1 TABLET (20 MG TOTAL) BY MOUTH AT BEDTIME. 90 tablet 1  . temazepam (RESTORIL) 15 MG capsule Take 1 capsule (15 mg total) by mouth at bedtime as needed for sleep. 30 capsule 2  . thiamine (VITAMIN  B-1) 100 MG tablet Take 100 mg daily by mouth.     No current facility-administered medications on file prior to visit.     Past Surgical History:  Procedure Laterality Date  . ABDOMINAL HYSTERECTOMY  12/1999   complete  . CARPAL TUNNEL RELEASE Right 12/27/2017   Procedure: CARPAL TUNNEL RELEASE;  Surgeon: Roseanne Kaufman, MD;  Location: [facility];  Service: Orthopedics;  Laterality: Right;  . CERVICAL CONIZATION W/BX  june 1990   dysplasia of cervix/used 5Fu cream for 3 months  . CERVICAL LAMINECTOMY  2005 & 2009   X 2   NO ROM PROBLEMS  .  [place]  . DILATION AND CURETTAGE OF UTERUS   [phone]   missed abortion  . ESOPHAGOGASTRODUODENOSCOPY  06/29/2011   Procedure: ESOPHAGOGASTRODUODENOSCOPY (EGD);  Surgeon: Shann Medal, MD;  Location: [facility];  Service: General;  Laterality: N/A;  . FOOT SURGERY  2005   RT HEEL  . GASTRIC BANDING PORT REVISION  09/24/2011   Procedure: GASTRIC BANDING PORT REVISION;  Surgeon: Pedro Earls, MD;  Location: [facility];  Service: General;  Laterality: N/A;  . GASTRIC ROUX-EN-Y N/A 06/06/2017   Procedure: Conversion from [place]leeve to [place];  Surgeon: Johnathan Hausen, MD;  Location: [facility];  Service: General;  Laterality: N/A;  . HYSTEROSCOPY  1999  . LAPAROSCOPIC GASTRIC BANDING  12/30/09  . LAPAROSCOPIC GASTRIC SLEEVE RESECTION N/A 07/02/2013   Procedure: LAPAROSCOPIC GASTRIC SLEEVE RESECTION upper endoscopy;  Surgeon: Pedro Earls, MD;  Location: [facility];  Service: General;  Laterality: N/A;  . LAPAROSCOPIC REPAIR AND REMOVAL OF GASTRIC BAND N/A 07/02/2013   Procedure: LAPAROSCOPIC REMOVAL OF GASTRIC BAND;  Surgeon: Pedro Earls, MD;  Location: [facility];  Service: General;  Laterality: N/A;  . LAPAROSCOPIC REVISION OF GASTRIC BAND  07/03/2012   Procedure: LAPAROSCOPIC REVISION OF GASTRIC BAND;  Surgeon: Pedro Earls, MD;  Location: [facility];  Service: General;  Laterality: N/A;  removal of old lap. band port, replaced with AP standard band  . LAPAROSCOPY  09/24/2011   Procedure: LAPAROSCOPY DIAGNOSTIC;  Surgeon: Pedro Earls, MD;  Location: [facility];  Service: General;  Laterality: N/A;  . LAPAROSCOPY  07/03/2012   Procedure: LAPAROSCOPY DIAGNOSTIC;  Surgeon: Pedro Earls, MD;  Location: [facility];  Service: General;  Laterality: N/A;  Exploratory Laparoscopy   . MESH APPLIED TO LAP PORT  07/03/2012   Procedure: MESH APPLIED TO LAP PORT;  Surgeon: Pedro Earls, MD;  Location: [facility];  Service: General;  Laterality: N/A;  . [place]   X 2  . right  knee  1981   ARTHROSCOPY AND ARTHROTOMY  . right knee arthroscopy and arthrotomy  12-1979  . TONSILLECTOMY  1971  . TUBAL LIGATION  1993   WITH C -SECTION    Allergies  Allergen Reactions  . Isoniazid     Severe SOB  . Nitrofurantoin Shortness Of Breath    REACTION: welts  . Hctz [Hydrochlorothiazide]     rash  . Promethazine Hcl [Promethazine Hcl]     IF GIVEN  IV-hallucinations     CAN TAKE PO PHENERGAN  . Tamiflu [Oseltamivir Phosphate] Nausea And Vomiting    "I vomited within 30 minutes of taking it and was told I cannot ever take it again."  . Macrolides And Ketolides     rash  . Morphine Other (See Comments)    severe vomiting  . Percocet [Oxycodone-Acetaminophen]     REACTION: severe itching. Can take by mouth codeine  . Roxicet [Oxycodone-Acetaminophen]     itching  . Sulfamethoxazole-Trimethoprim     Septra / Bactrim  --REACTION: rash  . Fentanyl Hives and Rash    REACTION: welts    Social History   Socioeconomic History  . Marital status: Married    Spouse name: Not on file  . Number of children: Not on file  . Years of education: Not on file  . Highest education level: Not on file  Occupational History  . Occupation: Programmer, multimedia: [employer]  Social Needs  . Financial resource strain: Not on file   . Food insecurity:    Worry: Not on file    Inability: Not on file  . Transportation needs:    Medical: Not on file    Non-medical: Not on file  Tobacco Use  . Smoking status: Never Smoker  . Smokeless tobacco: Never Used  Substance and Sexual Activity  . Alcohol use: No    Alcohol/week: 0.0 standard drinks  . Drug use: No  . Sexual activity: Yes    Birth control/protection: None  Lifestyle  . Physical activity:    Days per week: Not on file    Minutes per session: Not on file  . Stress: Not on file  Relationships  . Social connections:    Talks on phone: Not on file    Gets together: Not on file    Attends religious service: Not on file    Active member of club or organization: Not on file    Attends meetings of clubs or organizations: Not on file    Relationship status: Not on file  . Intimate partner violence:    Fear of current or ex partner: Not on file    Emotionally abused: Not on file    Physically abused: Not on file    Forced sexual activity: Not on file  Other Topics Concern  . Not on file  Social History Narrative  . Not on file    Family History  Problem Relation Age of Onset  . Diabetes Mother   . Stroke Mother   . Breast cancer Mother   . Atrial fibrillation Mother   . Hypertension Mother   . Cancer Mother        breast  . Diabetes Father   . Stroke Father   . Hypertension Father   . Heart attack Father   . Thyroid disease Unknown   . Heart disease Unknown   . COPD Other     BP 134/82   Pulse 80   Ht  '5\' 10"'  (1.778 m)   Wt 192 lb (87.1 kg)   BMI 27.55 kg/m   Review of Systems: See HPI above.     Objective:  Physical Exam:  Gen: NAD, comfortable in exam room  Right shoulder: No swelling, ecchymoses.  No gross deformity. No TTP AC, biceps tendon. FROM with painful arc. Positive Hawkins, negative Neers. Negative Yergasons. Strength 5/5 with empty can and resisted internal/external rotation.  Pain empty can. Negative  apprehension. NV intact distally.  Left shoulder: No swelling, ecchymoses.  No gross deformity. No TTP AC, biceps tendon. FROM with painful arc. Positive Hawkins, negative Neers. Negative Yergasons. Strength 5/5 with empty can and resisted internal/external rotation.  Pain empty can. Negative apprehension. NV intact distally.   Assessment & Plan:  1. Bilateral shoulder pain - 2/2 rotator cuff impingement.  Shown home exercises to do daily. She will also use nitro patches, 1/4th patch and change daily.  Injections given today as well with ultrasound guidance.  Can also use salon pas, tylenol, topical aspercreme.  Consider physical therapy.  F/u in 6 weeks.  After informed written consent timeout was performed, patient was seated on exam table. Right shoulder was prepped with alcohol swab and utilizing lateral approach with ultrasound guidance, patient's right subacromial space was injected with 3:1 bupivicaine: depomedrol. Patient tolerated the procedure well without immediate complications.  After informed written consent timeout was performed, patient was seated on exam table. Left shoulder was prepped with alcohol swab and utilizing lateral approach with ultrasound guidance, patient's left subacromial space was injected with 3:1 bupivicaine: depomedrol. Patient tolerated the procedure well without immediate complications.

## 2018-06-05 MED FILL — TEMAZEPAM 15 MG CAPSULE: 15 | 30 days supply | Qty: 30 | Fill #2

## 2018-06-06 MED FILL — DULoxetine HCL 30 MG CPEP: 30 | 30 days supply | Qty: 60 | Fill #1

## 2018-06-07 DIAGNOSIS — Z6826 Body mass index (BMI) 26.0-26.9, adult: Secondary | ICD-10-CM | POA: Diagnosis not present

## 2018-06-07 DIAGNOSIS — M2550 Pain in unspecified joint: Secondary | ICD-10-CM | POA: Diagnosis not present

## 2018-06-07 DIAGNOSIS — M0609 Rheumatoid arthritis without rheumatoid factor, multiple sites: Secondary | ICD-10-CM | POA: Diagnosis not present

## 2018-06-07 DIAGNOSIS — E663 Overweight: Secondary | ICD-10-CM | POA: Diagnosis not present

## 2018-06-07 DIAGNOSIS — Z79899 Other long term (current) drug therapy: Secondary | ICD-10-CM | POA: Diagnosis not present

## 2018-06-07 DIAGNOSIS — M130 Polyarthritis, unspecified: Secondary | ICD-10-CM | POA: Diagnosis not present

## 2018-06-07 DIAGNOSIS — Z9884 Bariatric surgery status: Secondary | ICD-10-CM | POA: Diagnosis not present

## 2018-06-19 MED FILL — HUMIRA PEN 40 MG/0.4ML PNKT: 40 | 28 days supply | Qty: 2 | Fill #2

## 2018-06-26 ENCOUNTER — Other Ambulatory Visit: Payer: Self-pay | Admitting: Family Medicine

## 2018-06-26 DIAGNOSIS — Z1231 Encounter for screening mammogram for malignant neoplasm of breast: Secondary | ICD-10-CM

## 2018-06-27 ENCOUNTER — Ambulatory Visit (HOSPITAL_BASED_OUTPATIENT_CLINIC_OR_DEPARTMENT_OTHER)
Admission: RE | Admit: 2018-06-27 | Discharge: 2018-06-27 | Disposition: A | Discharge status: Home / Self Care | Payer: 59 | Source: Ambulatory Visit | Attending: Family Medicine | Admitting: Family Medicine

## 2018-06-27 DIAGNOSIS — Z1231 Encounter for screening mammogram for malignant neoplasm of breast: Secondary | ICD-10-CM | POA: Diagnosis not present

## 2018-07-04 ENCOUNTER — Other Ambulatory Visit: Payer: Self-pay | Admitting: Family Medicine

## 2018-07-04 DIAGNOSIS — G4700 Insomnia, unspecified: Secondary | ICD-10-CM

## 2018-07-04 NOTE — Telephone Encounter (Signed)
Database ran and is on your desk for review.  Last filled per database: 06/05/18 Last written: 04/07/18 Last ov: 05/02/18 Next ov: none Contract: none [id number]

## 2018-07-06 MED FILL — TEMAZEPAM 15 MG CAPSULE: 15 | 30 days supply | Qty: 30 | Fill #0

## 2018-07-10 ENCOUNTER — Telehealth: Payer: Self-pay | Admitting: Pharmacist

## 2018-07-10 NOTE — Telephone Encounter (Signed)
Patient called me to ask about hyperglycemia and labile blood glucoses being side effects of Humira. I told her that I did not think that was common side effect and I did not see any reports of it in the package insert for Humira. She notices that it increases after each injection of Humira and then goes down over the next week so based on that report, it could be the drug. She denies any recent diet or drug changes. Her diet sounds to be very low in carbs so I wonder if maybe her body is compensating (she reports hypoglycemia). Last course of steroids was in October.  I recommended that she follow up with her rheumatologist and PCP. Patient verbalized understanding.

## 2018-07-14 ENCOUNTER — Ambulatory Visit: Payer: Self-pay

## 2018-07-14 ENCOUNTER — Other Ambulatory Visit: Payer: Self-pay | Admitting: Family

## 2018-07-14 ENCOUNTER — Ambulatory Visit: Payer: 59 | Admitting: Family Medicine

## 2018-07-14 ENCOUNTER — Encounter: Payer: Self-pay | Admitting: Family Medicine

## 2018-07-14 VITALS — BP 135/74 | HR 73 | Ht 71.0 in | Wt 194.0 lb

## 2018-07-14 DIAGNOSIS — M25511 Pain in right shoulder: Secondary | ICD-10-CM

## 2018-07-14 DIAGNOSIS — M25512 Pain in left shoulder: Secondary | ICD-10-CM

## 2018-07-14 MED ORDER — METHYLPREDNISOLONE ACETATE 40 MG/ML IJ SUSP
40.0000 mg | Freq: Once | INTRAMUSCULAR | Status: AC
  Administered 2018-07-14: 40 mg

## 2018-07-14 MED FILL — DULoxetine HCL 30 MG CPEP: 30 | 30 days supply | Qty: 60 | Fill #0

## 2018-07-14 NOTE — Patient Instructions (Signed)
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 There's a small subscapularis tear on the left, two small supraspinatus tears on the right as well - these also look chronic. You were given a repeat injection on the right side. Continue your home exercises. We will refer you to [facility] for physical therapy. Continue the nitro patches. Follow up with me in 6 weeks but we can inject the left side again if you're struggling sooner than that.

## 2018-07-14 NOTE — Telephone Encounter (Signed)
Melissa sent it in

## 2018-07-14 NOTE — Telephone Encounter (Signed)
rx has been sent. I don't see that she has had specific follow up with PCP for her depression. I would recommend follow up before her scheduled April visit for this.

## 2018-07-14 NOTE — Telephone Encounter (Signed)
Pt came in and is requesting a refill on her CYMBALTA 30 mg capsules. She is not due for a follow up until April. She is here to see Dr. Felipe Drone today and is hoping to get the Rx when she leaves, as she is completely out of it. Dr. Etter Sjogren is the pts PCP, however, Debbrah Alar is the one who prescribed it.

## 2018-07-16 ENCOUNTER — Telehealth: Payer: 59 | Admitting: Family

## 2018-07-16 ENCOUNTER — Encounter: Payer: Self-pay | Admitting: Family Medicine

## 2018-07-16 DIAGNOSIS — R05 Cough: Secondary | ICD-10-CM | POA: Diagnosis not present

## 2018-07-16 DIAGNOSIS — R059 Cough, unspecified: Secondary | ICD-10-CM

## 2018-07-16 MED ORDER — DOXYCYCLINE HYCLATE 100 MG PO TABS: 100.0000 mg | ORAL_TABLET | Freq: Two times a day (BID) | ORAL | 0 refills | Status: DC

## 2018-07-16 MED ORDER — BENZONATATE 100 MG PO CAPS: 100.0000 mg | ORAL_CAPSULE | Freq: Three times a day (TID) | ORAL | 0 refills | Status: DC | PRN

## 2018-07-16 NOTE — Progress Notes (Signed)
PCP: [name], DO  Subjective:   HPI: Patient is a 63 y.o. female here for bilateral shoulder pain.  06/01/18: Patient reports she's had off and on bilateral shoulder pain since February. She was having multiple other joint pains as well, diagnosed with RA and started on humira. This has helped most of her joint issues but shoulder pain persists. Pain right shoulder 9/10 and sharp, 5/10 on left. + night pain. Worse reaching behind and with cold weather. Thinks this occurred related to lifting boxes occasionally at work. She had injections back in June which resolved her pain but it has come back. She's taking robaxin, using heat. No skin changes, numbness.  07/14/18: Patient reports subacromial injections resolved her pain until about 1 week ago Now pain has returned at 8/10 level on right, 6/10 on left laterally, sharp and worse with reaching back, overhead. Using nitro patches, doing home exercises. Pulling covers up in bed also bothers her. No skin changes, numbness.  Past Medical History:  Diagnosis Date  . Allergic rhinitis   . Anxiety   . Asthma    hx bronchial asthma at times with upper resp infection  . Depression   . Diabetes mellitus without complication (HCC)    diet controlled no meds  . Fibromyalgia   . GERD (gastroesophageal reflux disease)   . Headache(784.0)    migraine and cluster headaches  . Hyperlipemia   . Hypertension   . IBS (irritable bowel syndrome)   . Menopause   . Neuromuscular disorder ([place]) 2009   hx of fibromyalgia, polyarthralsia (surgery induced)  . Stress incontinence    at times  . Tibial fracture 10/14/2016   evulsion periostial right    Current Outpatient Medications on File Prior to Visit  Medication Sig Dispense Refill  . acetaminophen (TYLENOL 8 HOUR ARTHRITIS PAIN) 650 MG CR tablet Take 650 mg by mouth 2 (two) times daily as needed for pain.    . Adalimumab (HUMIRA PEN) 40 MG/0.4ML PNKT Inject 1 pen into the  skin every 14 (fourteen) days. Inject 1 injection every other week under the skin 2 each 5  . Blood Glucose Monitoring Suppl (BAYER CONTOUR LINK MONITOR) W/DEVICE KIT Test blood sugar once daily DX code: E11.9 1 kit 1  . Ca Phosphate-Cholecalciferol (CALTRATE GUMMY BITES PO) Take 2 each 2 (two) times daily by mouth.     . cyclobenzaprine (FLEXERIL) 10 MG tablet Take 1 tablet (10 mg total) by mouth 3 (three) times daily as needed for muscle spasms. 30 tablet 0  . Diclofenac Sodium (PENNSAID) 2 % SOLN Place 1 application onto the skin 2 (two) times daily as needed (pain).    Marland Kitchen estradiol (VIVELLE-DOT) 0.1 MG/24HR Place 1 patch (0.1 mg total) onto the skin 2 (two) times a week. Monday and Thursday (Patient taking differently: Place 1 patch onto the skin 2 (two) times a week. Mondays and Thursdays) 8 patch 11  . furosemide (LASIX) 20 MG tablet TAKE 1 TABLET BY MOUTH EVERY MORNING 90 tablet 1  . glucose blood (ONETOUCH VERIO) test strip Check blood sugar daily 100 each 12  . glucose blood test strip Test blood sugar once daily. Dx Code: E11.9 100 each 12  . HYDROcodone-acetaminophen (HYCET) 7.5-325 mg/15 ml solution Take 10 mLs by mouth 4 (four) times daily as needed for moderate pain. 100 mL 0  . loratadine (CLARITIN) 10 MG tablet Take 10 mg by mouth every evening.     . methocarbamol (ROBAXIN) 500 MG tablet Take  1 tablet (500 mg total) by mouth 4 (four) times daily. 10 tablet 1  . nitroGLYCERIN (NITRODUR - DOSED IN MG/24 HR) 0.2 mg/hr patch Apply 1/4th patch to affected shoulder, change daily 30 patch 1  . olmesartan (BENICAR) 5 MG tablet Take 1 tablet (5 mg total) by mouth daily. 90 tablet 3  . ONETOUCH DELICA LANCETS FINE MISC check blood sugar daily 100 each 12  . Pediatric Multivit-Minerals-C (ONE-A-DAY SCOOBY-DOO GUMMIES PO) Take 1 tablet 2 (two) times daily by mouth.     . potassium chloride (K-DUR,KLOR-CON) 10 MEQ tablet TAKE 1 TABLET (10 MEQ TOTAL) BY MOUTH DAILY. 90 tablet 1  . PREMARIN vaginal  cream Apply 1 application topically 2 (two) times a week. Mondays & Thursdays.  1  . rosuvastatin (CRESTOR) 20 MG tablet TAKE 1 TABLET (20 MG TOTAL) BY MOUTH AT BEDTIME. 90 tablet 1  . temazepam (RESTORIL) 15 MG capsule TAKE 1 CAPSULE (15 MG TOTAL) BY MOUTH AT BEDTIME AS NEEDED FOR SLEEP. 30 capsule 2  . thiamine (VITAMIN B-1) 100 MG tablet Take 100 mg daily by mouth.     No current facility-administered medications on file prior to visit.     Past Surgical History:  Procedure Laterality Date  . ABDOMINAL HYSTERECTOMY  12/1999   complete  . CARPAL TUNNEL RELEASE Right 12/27/2017   Procedure: CARPAL TUNNEL RELEASE;  Surgeon: Roseanne Kaufman, MD;  Location: [facility];  Service: Orthopedics;  Laterality: Right;  . CERVICAL CONIZATION W/BX  june 1990   dysplasia of cervix/used 5Fu cream for 3 months  . CERVICAL LAMINECTOMY  2005 & 2009   X 2   NO ROM PROBLEMS  . [place]  . DILATION AND CURETTAGE OF UTERUS   [phone]   missed abortion  . ESOPHAGOGASTRODUODENOSCOPY  06/29/2011   Procedure: ESOPHAGOGASTRODUODENOSCOPY (EGD);  Surgeon: Shann Medal, MD;  Location: [facility];  Service: General;  Laterality: N/A;  . FOOT SURGERY  2005   RT HEEL  . GASTRIC BANDING PORT REVISION  09/24/2011   Procedure: GASTRIC BANDING PORT REVISION;  Surgeon: Pedro Earls, MD;  Location: [facility];  Service: General;  Laterality: N/A;  . GASTRIC ROUX-EN-Y N/A 06/06/2017   Procedure: Conversion from Sleeve to [place];  Surgeon: Johnathan Hausen, MD;  Location: [facility];  Service: General;  Laterality: N/A;  . HYSTEROSCOPY  1999  . LAPAROSCOPIC GASTRIC BANDING  12/30/09  . LAPAROSCOPIC GASTRIC SLEEVE RESECTION N/A 07/02/2013   Procedure: LAPAROSCOPIC GASTRIC SLEEVE RESECTION upper endoscopy;  Surgeon: Pedro Earls, MD;  Location: [facility];  Service: General;  Laterality: N/A;  . LAPAROSCOPIC REPAIR AND REMOVAL  OF GASTRIC BAND N/A 07/02/2013   Procedure: LAPAROSCOPIC REMOVAL OF GASTRIC BAND;  Surgeon: Pedro Earls, MD;  Location: [facility];  Service: General;  Laterality: N/A;  . LAPAROSCOPIC REVISION OF GASTRIC BAND  07/03/2012   Procedure: LAPAROSCOPIC REVISION OF GASTRIC BAND;  Surgeon: Pedro Earls, MD;  Location: [facility];  Service: General;  Laterality: N/A;  removal of old lap. band port, replaced with AP standard band  . LAPAROSCOPY  09/24/2011   Procedure: LAPAROSCOPY DIAGNOSTIC;  Surgeon: Pedro Earls, MD;  Location: [facility];  Service: General;  Laterality: N/A;  . LAPAROSCOPY  07/03/2012   Procedure: LAPAROSCOPY DIAGNOSTIC;  Surgeon: Pedro Earls, MD;  Location: [facility];  Service: General;  Laterality: N/A;  Exploratory Laparoscopy   .  MESH APPLIED TO LAP PORT  07/03/2012   Procedure: MESH APPLIED TO LAP PORT;  Surgeon: Pedro Earls, MD;  Location: [facility];  Service: General;  Laterality: N/A;  . [place]   X 2  . right knee  1981   ARTHROSCOPY AND ARTHROTOMY  . right knee arthroscopy and arthrotomy  12-1979  . TONSILLECTOMY  1971  . TUBAL LIGATION  1993   WITH C -SECTION    Allergies  Allergen Reactions  . Isoniazid     Severe SOB  . Nitrofurantoin Shortness Of Breath    REACTION: welts  . Hctz [Hydrochlorothiazide]     rash  . Promethazine Hcl [Promethazine Hcl]     IF GIVEN  IV-hallucinations     CAN TAKE PO PHENERGAN  . Tamiflu [Oseltamivir Phosphate] Nausea And Vomiting    "I vomited within 30 minutes of taking it and was told I cannot ever take it again."  . Macrolides And Ketolides     rash  . Morphine Other (See Comments)    severe vomiting  . Percocet [Oxycodone-Acetaminophen]     REACTION: severe itching. Can take by mouth codeine  . Roxicet [Oxycodone-Acetaminophen]     itching  . Sulfamethoxazole-Trimethoprim     Septra / Bactrim  --REACTION: rash  . Fentanyl Hives and Rash    REACTION: welts    Social History    Socioeconomic History  . Marital status: Married    Spouse name: Not on file  . Number of children: Not on file  . Years of education: Not on file  . Highest education level: Not on file  Occupational History  . Occupation: Programmer, multimedia: [employer]  Social Needs  . Financial resource strain: Not on file  . Food insecurity:    Worry: Not on file    Inability: Not on file  . Transportation needs:    Medical: Not on file    Non-medical: Not on file  Tobacco Use  . Smoking status: Never Smoker  . Smokeless tobacco: Never Used  Substance and Sexual Activity  . Alcohol use: No    Alcohol/week: 0.0 standard drinks  . Drug use: No  . Sexual activity: Yes    Birth control/protection: None  Lifestyle  . Physical activity:    Days per week: Not on file    Minutes per session: Not on file  . Stress: Not on file  Relationships  . Social connections:    Talks on phone: Not on file    Gets together: Not on file    Attends religious service: Not on file    Active member of club or organization: Not on file    Attends meetings of clubs or organizations: Not on file    Relationship status: Not on file  . Intimate partner violence:    Fear of current or ex partner: Not on file    Emotionally abused: Not on file    Physically abused: Not on file    Forced sexual activity: Not on file  Other Topics Concern  . Not on file  Social History Narrative  . Not on file    Family History  Problem Relation Age of Onset  . Diabetes Mother   . Stroke Mother   . Breast cancer Mother   . Atrial fibrillation Mother   . Hypertension Mother   . Cancer Mother        breast  . Diabetes  Father   . Stroke Father   . Hypertension Father   . Heart attack Father   . Thyroid disease Unknown   . Heart disease Unknown   . COPD Other     BP 135/74   Pulse 73   Ht '5\' 11"'  (1.803 m)   Wt 194 lb (88 kg)   BMI 27.06 kg/m   Review of Systems: See HPI above.     Objective:   Physical Exam:  Gen: NAD, comfortable in exam room  Right shoulder: No swelling, ecchymoses.  No gross deformity. No TTP AC, biceps tendon. FROM with painful arc. Positive Hawkins, Neers. Negative Yergasons. Strength 5/5 with empty can and resisted internal/external rotation. Negative apprehension.  Pain empty can, ER. NV intact distally.  Left shoulder: No swelling, ecchymoses.  No gross deformity. No TTP AC, biceps tendon. FROM with painful arc. Positive Hawkins, Neers. Negative Yergasons. Strength 5/5 with empty can and resisted internal/external rotation. Negative apprehension.  Pain empty can. NV intact distally.  MSK u/s right shoulder: Biceps tendon intact on long and trans views.  AC joint with moderate arthropathy.   Subscapularis and infraspinatus appear normal without abnormalities.  Supraspinatus with two small tears, largest of which with 38m retraction.  Tendon mildly hypoechoic consistent with chronic tendinopathy.  Very mild bursitis.  MSK u/s left shoulder: Biceps tendon intact on long and trans views.  AC joint with moderate arthropathy.  Small insertional tear of subscapularis.  Infraspinatus intact without abnormalities.  Supraspinatus with focal calcific tendinopathy.  Small insertional side tear noted of supraspinatus as well.  No bursitis.   Assessment & Plan:  1. Bilateral shoulder pain - Ultrasound performed and independently reviewed today.  Chronic pathology of both with tendinopathy, small partial tears primarily of supraspinatus, only very mild bursitis on the right.  Discussed options - she will continue the nitro patches, start physical therapy.  Subacromial injection repeated only on the right.  F/u in 6 weeks.  After informed written consent timeout was performed, patient was seated in chair in exam room. Right shoulder was prepped with alcohol swab and utilizing lateral approach with ultrasound guidance, patient's right subacromial space was injected  with 3:1 bupivicaine: depomedrol. Patient tolerated the procedure well without immediate complications.

## 2018-07-16 NOTE — Progress Notes (Signed)
We are sorry that you are not feeling well.  Here is how we plan to help!  Based on your presentation I believe you most likely have A cough due to bacteria.  When patients have a fever and a productive cough with a change in color or increased sputum production, we are concerned about bacterial bronchitis.  If left untreated it can progress to pneumonia.  If your symptoms do not improve with your treatment plan it is important that you contact your provider.   I have prescribed Doxycycline 100 mg twice a day for 7 days     In addition you may use A non-prescription cough medication called Robitussin DAC. Take 2 teaspoons every 8 hours or Delsym: take 2 teaspoons every 12 hours., A non-prescription cough medication called Mucinex DM: take 2 tablets every 12 hours. and A prescription cough medication called Tessalon Perles 100mg . You may take 1-2 capsules every 8 hours as needed for your cough.  Since you are on immune suppressants, we will treat. However, if your symptoms worsen or do not improve you need to be seen by your PCP.  From your responses in the eVisit questionnaire you describe inflammation in the upper respiratory tract which is causing a significant cough.  This is commonly called Bronchitis and has four common causes:    Allergies  Viral Infections  Acid Reflux  Bacterial Infection Allergies, viruses and acid reflux are treated by controlling symptoms or eliminating the cause. An example might be a cough caused by taking certain blood pressure medications. You stop the cough by changing the medication. Another example might be a cough caused by acid reflux. Controlling the reflux helps control the cough.  USE OF BRONCHODILATOR ("RESCUE") INHALERS: There is a risk from using your bronchodilator too frequently.  The risk is that over-reliance on a medication which only relaxes the muscles surrounding the breathing tubes can reduce the effectiveness of medications prescribed to reduce  swelling and congestion of the tubes themselves.  Although you feel brief relief from the bronchodilator inhaler, your asthma may actually be worsening with the tubes becoming more swollen and filled with mucus.  This can delay other crucial treatments, such as oral steroid medications. If you need to use a bronchodilator inhaler daily, several times per day, you should discuss this with your provider.  There are probably better treatments that could be used to keep your asthma under control.     HOME CARE . Only take medications as instructed by your medical team. . Complete the entire course of an antibiotic. . Drink plenty of fluids and get plenty of rest. . Avoid close contacts especially the very young and the elderly . Cover your mouth if you cough or cough into your sleeve. . Always remember to wash your hands . A steam or ultrasonic humidifier can help congestion.   GET HELP RIGHT AWAY IF: . You develop worsening fever. . You become short of breath . You cough up blood. . Your symptoms persist after you have completed your treatment plan MAKE SURE YOU   Understand these instructions.  Will watch your condition.  Will get help right away if you are not doing well or get worse.  Your e-visit answers were reviewed by a board certified advanced clinical practitioner to complete your personal care plan.  Depending on the condition, your plan could have included both over the counter or prescription medications. If there is a problem please reply  once you have received a response  from your provider. Your safety is important to [place].  If you have drug allergies check your prescription carefully.    You can use MyChart to ask questions about today's visit, request a non-urgent call back, or ask for a work or school excuse for 24 hours related to this e-Visit. If it has been greater than 24 hours you will need to follow up with your provider, or enter a new e-Visit to address those  concerns. You will get an e-mail in the next two days asking about your experience.  I hope that your e-visit has been valuable and will speed your recovery. Thank you for using e-visits.

## 2018-07-17 MED FILL — HUMIRA PEN 40 MG/0.4ML PNKT: 40 | 28 days supply | Qty: 2 | Fill #3

## 2018-07-18 ENCOUNTER — Ambulatory Visit: Payer: Self-pay

## 2018-07-18 NOTE — Telephone Encounter (Signed)
Call returned to patient who states that she is having cough with green brown mucus.  She has ear pain and throat pain,  She has pressure in her head. Her temperature is  99.8.  She has taken tylenol arthritis because that is what she has on hand. She took her last dose before lunch.  She states when she coughs that it takes her breath from all the phlegm.  Pt had to hang up because of work issues. She is requesting that I Schedule appointment can call her back.Appointment scheduled per protocol. I called the patient and left VM with office appointment information and request to call back to reschedule if necessary. No care advice was read to patient because I was unable to reach her via her phone.  Reason for Disposition . Coughing up rusty-colored (reddish-brown) sputum  Answer Assessment - Initial Assessment Questions 1. ONSET: "When did the cough begin?"      Sunday 2. SEVERITY: "How bad is the cough today?"      severe 3. RESPIRATORY DISTRESS: "Describe your breathing."      Hard to catch breath after coughing because of Phlegm 4. FEVER: "Do you have a fever?" If so, ask: "What is your temperature, how was it measured, and when did it start?"    99.8 has taken tylenol at lunch 650 arthritis 5. SPUTUM: "Describe the color of your sputum" (clear, white, yellow, green)     Green brown 6. HEMOPTYSIS: "Are you coughing up any blood?" If so ask: "How much?" (flecks, streaks, tablespoons, etc.)     no 7. CARDIAC HISTORY: "Do you have any history of heart disease?" (e.g., heart attack, congestive heart failure)      no 8. LUNG HISTORY: "Do you have any history of lung disease?"  (e.g., pulmonary embolus, asthma, emphysema)     no 9. PE RISK FACTORS: "Do you have a history of blood clots?" (or: recent major surgery, recent prolonged travel, bedridden)     no 10. OTHER SYMPTOMS: "Do you have any other symptoms?" (e.g., runny nose, wheezing, chest pain)       Ear aching pressure to forehead cheeks  ears sore throat stuffy to runny nose. 11. PREGNANCY: "Is there any chance you are pregnant?" "When was your last menstrual period?"       N/A 12. TRAVEL: "Have you traveled out of the country in the last month?" (e.g., travel history, exposures)      No  Protocols used: [place]

## 2018-07-19 ENCOUNTER — Ambulatory Visit: Payer: 59 | Admitting: Family Medicine

## 2018-07-26 ENCOUNTER — Encounter: Payer: Self-pay | Admitting: Medical

## 2018-07-26 ENCOUNTER — Ambulatory Visit (HOSPITAL_BASED_OUTPATIENT_CLINIC_OR_DEPARTMENT_OTHER)
Admission: RE | Admit: 2018-07-26 | Discharge: 2018-07-26 | Disposition: A | Discharge status: Home / Self Care | Payer: 59 | Source: Ambulatory Visit | Attending: Medical | Admitting: Medical

## 2018-07-26 ENCOUNTER — Ambulatory Visit: Payer: 59 | Admitting: Medical

## 2018-07-26 VITALS — BP 136/72 | HR 73 | Temp 98.2°F | Resp 16 | Ht 71.0 in | Wt 197.4 lb

## 2018-07-26 DIAGNOSIS — R05 Cough: Secondary | ICD-10-CM

## 2018-07-26 DIAGNOSIS — J40 Bronchitis, not specified as acute or chronic: Secondary | ICD-10-CM

## 2018-07-26 DIAGNOSIS — R509 Fever, unspecified: Secondary | ICD-10-CM

## 2018-07-26 DIAGNOSIS — R5383 Other fatigue: Secondary | ICD-10-CM

## 2018-07-26 DIAGNOSIS — R059 Cough, unspecified: Secondary | ICD-10-CM

## 2018-07-26 DIAGNOSIS — E010 Iodine-deficiency related diffuse (endemic) goiter: Secondary | ICD-10-CM | POA: Insufficient documentation

## 2018-07-26 DIAGNOSIS — R0989 Other specified symptoms and signs involving the circulatory and respiratory systems: Secondary | ICD-10-CM | POA: Diagnosis not present

## 2018-07-26 LAB — CBC WITH DIFFERENTIAL/PLATELET
Basophils Absolute: 0.1 10*3/uL (ref 0.0–0.1)
Basophils Relative: 0.7 % (ref 0.0–3.0)
Eosinophils Absolute: 0.2 10*3/uL (ref 0.0–0.7)
Eosinophils Relative: 2.2 % (ref 0.0–5.0)
HCT: 40.4 % (ref 36.0–46.0)
Hemoglobin: 13.3 g/dL (ref 12.0–15.0)
Lymphocytes Relative: 32.6 % (ref 12.0–46.0)
Lymphs Abs: 2.9 10*3/uL (ref 0.7–4.0)
MCHC: 32.9 g/dL (ref 30.0–36.0)
MCV: 86.4 fl (ref 78.0–100.0)
Monocytes Absolute: 0.7 10*3/uL (ref 0.1–1.0)
Monocytes Relative: 7.8 % (ref 3.0–12.0)
Neutro Abs: 5 10*3/uL (ref 1.4–7.7)
Neutrophils Relative %: 56.7 % (ref 43.0–77.0)
Platelets: 247 10*3/uL (ref 150.0–400.0)
RBC: 4.68 Mil/uL (ref 3.87–5.11)
RDW: 14.2 % (ref 11.5–15.5)
WBC: 8.8 10*3/uL (ref 4.0–10.5)

## 2018-07-26 LAB — TSH: TSH: 2.29 u[IU]/mL (ref 0.35–4.50)

## 2018-07-26 MED ORDER — LEVOFLOXACIN 500 MG PO TABS: 500.0000 mg | ORAL_TABLET | Freq: Every day | ORAL | 0 refills | Status: DC

## 2018-07-26 MED ORDER — MUPIROCIN 2 % EX OINT: TOPICAL_OINTMENT | CUTANEOUS | 0 refills | Status: DC

## 2018-07-26 MED ORDER — HYDROCODONE-HOMATROPINE 5-1.5 MG/5ML PO SYRP: 5.0000 mL | ORAL_SOLUTION | Freq: Four times a day (QID) | ORAL | 0 refills | Status: DC | PRN

## 2018-07-26 MED FILL — levoFLOXacin 500 MG TABS: 500 | 10 days supply | Qty: 10 | Fill #0

## 2018-07-26 MED FILL — HYDROCODONE-HOMATROPINE SOL: 5-1.5 | 5 days supply | Qty: 100 | Fill #0

## 2018-07-26 MED FILL — MUPIROCIN 2% OINTMENT: 2 | 10 days supply | Qty: 22 | Fill #0

## 2018-07-26 NOTE — Patient Instructions (Addendum)
You do have persisting bronchitis type symptoms but you had concern for possible pneumonia.  Clinically did not respond completely to doxycycline.  Will prescribe levofloxacin antibiotic.  Please eat probiotic rich foods or get probiotic tabs over-the-counter.  For cough, I prescribed Hycodan.  Rx advisement given.  Please get chest x-ray today.  Will get CBC as well.  You mentioned history of possible thyromegaly and some fatigue.  The fatigue likely from current illness.  We will go ahead and get TSH and I did place order to get thyroid ultrasound.  Small abrasion from dog paw scratch on the left forearm.  No obvious infection of the skin.  I did send topical mupirocin on antibiotic and apply twice daily to the small abrasion.  Follow-up in 7 to 10 days or as needed.

## 2018-07-26 NOTE — Progress Notes (Signed)
Subjective:    Patient ID: [name], female    DOB: 1956-05-05, 63 y.o.   MRN: [id number]  HPI  Pt in with nasal congestion and some chest congestion for about 10 days. Started shortly after humera dose.   Pt states started with st and productive cough. Pt called and had e visit. Pt was placed on doxycycline for close to 10 days. Her symptoms are still persisting.  Pt has been told to hold humera injection.  Pt has been waking up at night sweating. Pt checked her temp and was 101.1.   Pt has recent left arm abrasion from dog paws.  Review of Systems  Constitutional: Positive for diaphoresis, fatigue and fever.  Respiratory: Positive for cough. Negative for chest tightness, shortness of breath and wheezing.   Cardiovascular: Negative for chest pain and palpitations.  Gastrointestinal: Positive for nausea. Negative for abdominal distention, abdominal pain and blood in stool.  Musculoskeletal: Negative for back pain.  Skin: Negative for rash.  Neurological: Negative for dizziness, seizures, light-headedness and headaches.  Hematological: Negative for adenopathy. Does not bruise/bleed easily.  Psychiatric/Behavioral: Negative for behavioral problems and confusion. The patient is not nervous/anxious.     Past Medical History:  Diagnosis Date  . Allergic rhinitis   . Anxiety   . Asthma    hx bronchial asthma at times with upper resp infection  . Depression   . Diabetes mellitus without complication (HCC)    diet controlled no meds  . Fibromyalgia   . GERD (gastroesophageal reflux disease)   . Headache(784.0)    migraine and cluster headaches  . Hyperlipemia   . Hypertension   . IBS (irritable bowel syndrome)   . Menopause   . Neuromuscular disorder ([place]) 2009   hx of fibromyalgia, polyarthralsia (surgery induced)  . Stress incontinence    at times  . Tibial fracture 10/14/2016   evulsion periostial right     Social History   Socioeconomic History  . Marital  status: Married    Spouse name: Not on file  . Number of children: Not on file  . Years of education: Not on file  . Highest education level: Not on file  Occupational History  . Occupation: Programmer, multimedia: [employer]  Social Needs  . Financial resource strain: Not on file  . Food insecurity:    Worry: Not on file    Inability: Not on file  . Transportation needs:    Medical: Not on file    Non-medical: Not on file  Tobacco Use  . Smoking status: Never Smoker  . Smokeless tobacco: Never Used  Substance and Sexual Activity  . Alcohol use: No    Alcohol/week: 0.0 standard drinks  . Drug use: No  . Sexual activity: Yes    Birth control/protection: None  Lifestyle  . Physical activity:    Days per week: Not on file    Minutes per session: Not on file  . Stress: Not on file  Relationships  . Social connections:    Talks on phone: Not on file    Gets together: Not on file    Attends religious service: Not on file    Active member of club or organization: Not on file    Attends meetings of clubs or organizations: Not on file    Relationship status: Not on file  . Intimate partner violence:    Fear of current or ex partner: Not on file    Emotionally  abused: Not on file    Physically abused: Not on file    Forced sexual activity: Not on file  Other Topics Concern  . Not on file  Social History Narrative  . Not on file    Past Surgical History:  Procedure Laterality Date  . ABDOMINAL HYSTERECTOMY  12/1999   complete  . CARPAL TUNNEL RELEASE Right 12/27/2017   Procedure: CARPAL TUNNEL RELEASE;  Surgeon: Roseanne Kaufman, MD;  Location: [facility];  Service: Orthopedics;  Laterality: Right;  . CERVICAL CONIZATION W/BX  june 1990   dysplasia of cervix/used 5Fu cream for 3 months  . CERVICAL LAMINECTOMY  2005 & 2009   X 2   NO ROM PROBLEMS  . [place]  . DILATION AND CURETTAGE OF UTERUS   [phone]   missed  abortion  . ESOPHAGOGASTRODUODENOSCOPY  06/29/2011   Procedure: ESOPHAGOGASTRODUODENOSCOPY (EGD);  Surgeon: Shann Medal, MD;  Location: [facility];  Service: General;  Laterality: N/A;  . FOOT SURGERY  2005   RT HEEL  . GASTRIC BANDING PORT REVISION  09/24/2011   Procedure: GASTRIC BANDING PORT REVISION;  Surgeon: Pedro Earls, MD;  Location: [facility];  Service: General;  Laterality: N/A;  . GASTRIC ROUX-EN-Y N/A 06/06/2017   Procedure: Conversion from [place]leeve to [place];  Surgeon: Johnathan Hausen, MD;  Location: [facility];  Service: General;  Laterality: N/A;  . HYSTEROSCOPY  1999  . LAPAROSCOPIC GASTRIC BANDING  12/30/09  . LAPAROSCOPIC GASTRIC SLEEVE RESECTION N/A 07/02/2013   Procedure: LAPAROSCOPIC GASTRIC SLEEVE RESECTION upper endoscopy;  Surgeon: Pedro Earls, MD;  Location: [facility];  Service: General;  Laterality: N/A;  . LAPAROSCOPIC REPAIR AND REMOVAL OF GASTRIC BAND N/A 07/02/2013   Procedure: LAPAROSCOPIC REMOVAL OF GASTRIC BAND;  Surgeon: Pedro Earls, MD;  Location: [facility];  Service: General;  Laterality: N/A;  . LAPAROSCOPIC REVISION OF GASTRIC BAND  07/03/2012   Procedure: LAPAROSCOPIC REVISION OF GASTRIC BAND;  Surgeon: Pedro Earls, MD;  Location: [facility];  Service: General;  Laterality: N/A;  removal of old lap. band port, replaced with AP standard band  . LAPAROSCOPY  09/24/2011   Procedure: LAPAROSCOPY DIAGNOSTIC;  Surgeon: Pedro Earls, MD;  Location: [facility];  Service: General;  Laterality: N/A;  . LAPAROSCOPY  07/03/2012   Procedure: LAPAROSCOPY DIAGNOSTIC;  Surgeon: Pedro Earls, MD;  Location: [facility];  Service: General;  Laterality: N/A;  Exploratory Laparoscopy   . MESH APPLIED TO LAP PORT  07/03/2012   Procedure: MESH APPLIED TO LAP PORT;  Surgeon: Pedro Earls, MD;  Location: [facility];  Service: General;  Laterality: N/A;  . [place]   X 2  . right knee  1981    ARTHROSCOPY AND ARTHROTOMY  . right knee arthroscopy and arthrotomy  12-1979  . TONSILLECTOMY  1971  . TUBAL LIGATION  1993   WITH C -SECTION    Family History  Problem Relation Age of Onset  . Diabetes Mother   . Stroke Mother   . Breast cancer Mother   . Atrial fibrillation Mother   . Hypertension Mother   . Cancer Mother        breast  . Diabetes Father   . Stroke Father   . Hypertension Father   . Heart attack Father   . Thyroid disease Unknown   . Heart disease  Unknown   . COPD Other     Allergies  Allergen Reactions  . Isoniazid     Severe SOB  . Nitrofurantoin Shortness Of Breath    REACTION: welts  . Hctz [Hydrochlorothiazide]     rash  . Promethazine Hcl [Promethazine Hcl]     IF GIVEN  IV-hallucinations     CAN TAKE PO PHENERGAN  . Tamiflu [Oseltamivir Phosphate] Nausea And Vomiting    "I vomited within 30 minutes of taking it and was told I cannot ever take it again."  . Macrolides And Ketolides     rash  . Morphine Other (See Comments)    severe vomiting  . Percocet [Oxycodone-Acetaminophen]     REACTION: severe itching. Can take by mouth codeine  . Roxicet [Oxycodone-Acetaminophen]     itching  . Sulfamethoxazole-Trimethoprim     Septra / Bactrim  --REACTION: rash  . Fentanyl Hives and Rash    REACTION: welts    Current Outpatient Medications on File Prior to Visit  Medication Sig Dispense Refill  . acetaminophen (TYLENOL 8 HOUR ARTHRITIS PAIN) 650 MG CR tablet Take 650 mg by mouth 2 (two) times daily as needed for pain.    . Adalimumab (HUMIRA PEN) 40 MG/0.4ML PNKT Inject 1 pen into the skin every 14 (fourteen) days. Inject 1 injection every other week under the skin 2 each 5  . benzonatate (TESSALON PERLES) 100 MG capsule Take 1 capsule (100 mg total) by mouth 3 (three) times daily as needed. 20 capsule 0  . Blood Glucose Monitoring Suppl (BAYER CONTOUR LINK MONITOR) W/DEVICE KIT Test blood sugar once daily DX code: E11.9 1 kit 1  . Ca  Phosphate-Cholecalciferol (CALTRATE GUMMY BITES PO) Take 2 each 2 (two) times daily by mouth.     . cyclobenzaprine (FLEXERIL) 10 MG tablet Take 1 tablet (10 mg total) by mouth 3 (three) times daily as needed for muscle spasms. 30 tablet 0  . Diclofenac Sodium (PENNSAID) 2 % SOLN Place 1 application onto the skin 2 (two) times daily as needed (pain).    Marland Kitchen doxycycline (VIBRA-TABS) 100 MG tablet Take 1 tablet (100 mg total) by mouth 2 (two) times daily. 20 tablet 0  . DULoxetine (CYMBALTA) 30 MG capsule Take 2 capsules (60 mg total) by mouth daily. 60 capsule 0  . estradiol (VIVELLE-DOT) 0.1 MG/24HR Place 1 patch (0.1 mg total) onto the skin 2 (two) times a week. Monday and Thursday (Patient taking differently: Place 1 patch onto the skin 2 (two) times a week. Mondays and Thursdays) 8 patch 11  . furosemide (LASIX) 20 MG tablet TAKE 1 TABLET BY MOUTH EVERY MORNING 90 tablet 1  . glucose blood (ONETOUCH VERIO) test strip Check blood sugar daily 100 each 12  . glucose blood test strip Test blood sugar once daily. Dx Code: E11.9 100 each 12  . HYDROcodone-acetaminophen (HYCET) 7.5-325 mg/15 ml solution Take 10 mLs by mouth 4 (four) times daily as needed for moderate pain. 100 mL 0  . loratadine (CLARITIN) 10 MG tablet Take 10 mg by mouth every evening.     . methocarbamol (ROBAXIN) 500 MG tablet Take 1 tablet (500 mg total) by mouth 4 (four) times daily. 10 tablet 1  . MISC NATURAL PRODUCTS PO Take 750 mg by mouth.    . nitroGLYCERIN (NITRODUR - DOSED IN MG/24 HR) 0.2 mg/hr patch Apply 1/4th patch to affected shoulder, change daily 30 patch 1  . olmesartan (BENICAR) 5 MG tablet Take 1 tablet (5 mg  total) by mouth daily. 90 tablet 3  . ONETOUCH DELICA LANCETS FINE MISC check blood sugar daily 100 each 12  . Pediatric Multivit-Minerals-C (ONE-A-DAY SCOOBY-DOO GUMMIES PO) Take 1 tablet 2 (two) times daily by mouth.     . potassium chloride (K-DUR,KLOR-CON) 10 MEQ tablet TAKE 1 TABLET (10 MEQ TOTAL) BY MOUTH  DAILY. 90 tablet 1  . PREMARIN vaginal cream Apply 1 application topically 2 (two) times a week. Mondays & Thursdays.  1  . rosuvastatin (CRESTOR) 20 MG tablet TAKE 1 TABLET (20 MG TOTAL) BY MOUTH AT BEDTIME. 90 tablet 1  . temazepam (RESTORIL) 15 MG capsule TAKE 1 CAPSULE (15 MG TOTAL) BY MOUTH AT BEDTIME AS NEEDED FOR SLEEP. 30 capsule 2  . thiamine (VITAMIN B-1) 100 MG tablet Take 100 mg daily by mouth.     No current facility-administered medications on file prior to visit.     BP 136/72   Pulse 73   Temp 98.2 F (36.8 C) (Oral)   Resp 16   Ht '5\' 11"'  (1.803 m)   Wt 197 lb 6.4 oz (89.5 kg)   SpO2 98%   BMI 27.53 kg/m       Objective:   Physical Exam  General  Mental Status - Alert. General Appearance - Well groomed. Not in acute distress.  Skin Rashes- No Rashes.  HEENT Head- Normal. Ear Auditory Canal - Left- Normal. Right - Normal.Tympanic Membrane- Left- Normal. Right- Normal. Eye Sclera/Conjunctiva- Left- Normal. Right- Normal. Nose & Sinuses Nasal Mucosa- Left-  Boggy and Congested. Right-  Boggy and  Congested.Bilateral maxillary and frontal sinus pressure. Mouth & Throat Lips: Upper Lip- Normal: no dryness, cracking, pallor, cyanosis, or vesicular eruption. Lower Lip-Normal: no dryness, cracking, pallor, cyanosis or vesicular eruption. Buccal Mucosa- Bilateral- No Aphthous ulcers. Oropharynx- No Discharge or Erythema. Tonsils: Characteristics- Bilateral- No Erythema or Congestion. Size/Enlargement- Bilateral- No enlargement. Discharge- bilateral-None.  Neck Neck- Supple. No Masses.(possible borderline enlarged thyroid)   Chest and Lung Exam Auscultation: Breath Sounds:-Clear even and unlabored.  Cardiovascular Auscultation:Rythm- Regular, rate and rhythm. Murmurs & Other Heart Sounds:Ausculatation of the heart reveal- No Murmurs.  Lymphatic Head & Neck General Head & Neck Lymphatics: Bilateral: Description- No Localized lymphadenopathy.         Assessment & Plan:  You do have persisting bronchitis type symptoms but you had concern for possible pneumonia.  Clinically did not respond completely to doxycycline.  Will prescribe levofloxacin antibiotic.  Please eat probiotic rich foods or get probiotic tabs over-the-counter.  For cough, I prescribed Hycodan.  Rx advisement given.  Please get chest x-ray today.  Will get CBC as well.  You mentioned history of possible thyromegaly and some fatigue.  The fatigue likely from current illness.  We will go ahead and get TSH and I did place order to get thyroid ultrasound.  Small abrasion from dog paw scratch on the left forearm.  No obvious infection of the skin.  I did send topical mupirocin on antibiotic and apply twice daily to the small abrasion.  Follow-up in 7 to 10 days or as needed.  Mackie Pai, PA-C

## 2018-08-07 MED FILL — TEMAZEPAM 15 MG CAPSULE: 15 | 30 days supply | Qty: 30 | Fill #1

## 2018-08-14 DIAGNOSIS — M25512 Pain in left shoulder: Secondary | ICD-10-CM | POA: Diagnosis not present

## 2018-08-14 DIAGNOSIS — M25511 Pain in right shoulder: Secondary | ICD-10-CM | POA: Diagnosis not present

## 2018-08-14 MED FILL — HUMIRA PEN 40 MG/0.4ML PNKT: 40 | 28 days supply | Qty: 2 | Fill #4

## 2018-08-16 MED FILL — NITROGLYCERIN 0.2 MG/HR PAT: 0.2 | 84 days supply | Qty: 21 | Fill #1

## 2018-08-21 MED FILL — OLMESARTAN MEDOXOMIL 5 MG T: 5 | 90 days supply | Qty: 90 | Fill #1

## 2018-08-22 ENCOUNTER — Other Ambulatory Visit: Payer: Self-pay | Admitting: Family

## 2018-08-22 MED FILL — DULoxetine HCL 30 MG CPEP: 30 | 30 days supply | Qty: 60 | Fill #0

## 2018-08-24 ENCOUNTER — Encounter: Payer: Self-pay | Admitting: Family Medicine

## 2018-08-24 ENCOUNTER — Ambulatory Visit: Payer: 59 | Admitting: Family Medicine

## 2018-08-24 VITALS — BP 136/68 | HR 67 | Temp 98.5°F | Resp 16 | Ht 71.0 in | Wt 197.6 lb

## 2018-08-24 DIAGNOSIS — F419 Anxiety disorder, unspecified: Secondary | ICD-10-CM | POA: Diagnosis not present

## 2018-08-24 DIAGNOSIS — M0579 Rheumatoid arthritis with rheumatoid factor of multiple sites without organ or systems involvement: Secondary | ICD-10-CM

## 2018-08-24 DIAGNOSIS — E118 Type 2 diabetes mellitus with unspecified complications: Secondary | ICD-10-CM

## 2018-08-24 DIAGNOSIS — E119 Type 2 diabetes mellitus without complications: Secondary | ICD-10-CM | POA: Diagnosis not present

## 2018-08-24 DIAGNOSIS — E785 Hyperlipidemia, unspecified: Secondary | ICD-10-CM | POA: Diagnosis not present

## 2018-08-24 DIAGNOSIS — Z0000 Encounter for general adult medical examination without abnormal findings: Secondary | ICD-10-CM

## 2018-08-24 DIAGNOSIS — G4700 Insomnia, unspecified: Secondary | ICD-10-CM | POA: Diagnosis not present

## 2018-08-24 DIAGNOSIS — E0865 Diabetes mellitus due to underlying condition with hyperglycemia: Secondary | ICD-10-CM

## 2018-08-24 DIAGNOSIS — I10 Essential (primary) hypertension: Secondary | ICD-10-CM | POA: Diagnosis not present

## 2018-08-24 DIAGNOSIS — E1169 Type 2 diabetes mellitus with other specified complication: Secondary | ICD-10-CM | POA: Insufficient documentation

## 2018-08-24 LAB — CBC WITH DIFFERENTIAL/PLATELET
Basophils Absolute: 0.1 10*3/uL (ref 0.0–0.1)
Basophils Relative: 0.7 % (ref 0.0–3.0)
Eosinophils Absolute: 0.2 10*3/uL (ref 0.0–0.7)
Eosinophils Relative: 2.2 % (ref 0.0–5.0)
HCT: 42.4 % (ref 36.0–46.0)
Hemoglobin: 13.8 g/dL (ref 12.0–15.0)
Lymphocytes Relative: 28.9 % (ref 12.0–46.0)
Lymphs Abs: 2.6 10*3/uL (ref 0.7–4.0)
MCHC: 32.6 g/dL (ref 30.0–36.0)
MCV: 87.3 fl (ref 78.0–100.0)
Monocytes Absolute: 0.9 10*3/uL (ref 0.1–1.0)
Monocytes Relative: 9.9 % (ref 3.0–12.0)
Neutro Abs: 5.2 10*3/uL (ref 1.4–7.7)
Neutrophils Relative %: 58.3 % (ref 43.0–77.0)
Platelets: 259 10*3/uL (ref 150.0–400.0)
RBC: 4.86 Mil/uL (ref 3.87–5.11)
RDW: 13.9 % (ref 11.5–15.5)
WBC: 8.9 10*3/uL (ref 4.0–10.5)

## 2018-08-24 LAB — COMPREHENSIVE METABOLIC PANEL
ALT: 48 U/L — ABNORMAL HIGH (ref 0–35)
AST: 25 U/L (ref 0–37)
Albumin: 4.3 g/dL (ref 3.5–5.2)
Alkaline Phosphatase: 78 U/L (ref 39–117)
BUN: 11 mg/dL (ref 6–23)
CO2: 34 mEq/L — ABNORMAL HIGH (ref 19–32)
Calcium: 9.8 mg/dL (ref 8.4–10.5)
Chloride: 100 mEq/L (ref 96–112)
Creatinine, Ser: 0.77 mg/dL (ref 0.40–1.20)
GFR: 76.05 mL/min (ref >60.00)
Glucose, Bld: 111 mg/dL — ABNORMAL HIGH (ref 70–99)
Potassium: 5.3 mEq/L — ABNORMAL HIGH (ref 3.5–5.1)
Sodium: 140 mEq/L (ref 135–145)
Total Bilirubin: 0.7 mg/dL (ref 0.2–1.2)
Total Protein: 7.3 g/dL (ref 6.0–8.3)

## 2018-08-24 LAB — LIPID PANEL
Cholesterol: 140 mg/dL (ref 0–200)
HDL: 80.7 mg/dL (ref >39.00)
LDL Cholesterol: 41 mg/dL (ref 0–99)
NonHDL: 59.69
Total CHOL/HDL Ratio: 2
Triglycerides: 92 mg/dL (ref 0.0–149.0)
VLDL: 18.4 mg/dL (ref 0.0–40.0)

## 2018-08-24 LAB — HEMOGLOBIN A1C: Hgb A1c MFr Bld: 6.2 % (ref 4.6–6.5)

## 2018-08-24 MED ORDER — DULOXETINE HCL 30 MG PO CPEP: 60.0000 mg | ORAL_CAPSULE | Freq: Every day | ORAL | 3 refills | Status: DC

## 2018-08-24 MED ORDER — TEMAZEPAM 30 MG PO CAPS: 30.0000 mg | ORAL_CAPSULE | Freq: Every evening | ORAL | 0 refills | Status: DC | PRN

## 2018-08-24 MED ORDER — FUROSEMIDE 20 MG PO TABS: ORAL_TABLET | ORAL | 1 refills | Status: DC

## 2018-08-24 MED ORDER — ROSUVASTATIN CALCIUM 20 MG PO TABS: ORAL_TABLET | ORAL | 1 refills | Status: DC

## 2018-08-24 MED ORDER — OLMESARTAN MEDOXOMIL 5 MG PO TABS: 5.0000 mg | ORAL_TABLET | Freq: Every day | ORAL | 3 refills | Status: DC

## 2018-08-24 MED ORDER — POTASSIUM CHLORIDE CRYS ER 10 MEQ PO TBCR: EXTENDED_RELEASE_TABLET | ORAL | 1 refills | Status: DC

## 2018-08-24 MED FILL — FUROSEMIDE 20 MG TAB: 20 | 90 days supply | Qty: 90 | Fill #0

## 2018-08-24 NOTE — Assessment & Plan Note (Signed)
Tolerating statin, encouraged heart healthy diet, avoid trans fats, minimize simple carbs and saturated fats. Increase exercise as tolerated 

## 2018-08-24 NOTE — Assessment & Plan Note (Signed)
Well controlled, no changes to meds. Encouraged heart healthy diet such as the DASH diet and exercise as tolerated.  °

## 2018-08-24 NOTE — Patient Instructions (Signed)
Carbohydrate Counting for Diabetes Mellitus, Adult  Carbohydrate counting is a method of keeping track of how many carbohydrates you eat. Eating carbohydrates naturally increases the amount of sugar (glucose) in the blood. Counting how many carbohydrates you eat helps keep your blood glucose within normal limits, which helps you manage your diabetes (diabetes mellitus). It is important to know how many carbohydrates you can safely have in each meal. This is different for every person. A diet and nutrition specialist (registered dietitian) can help you make a meal plan and calculate how many carbohydrates you should have at each meal and snack. Carbohydrates are found in the following foods:  Grains, such as breads and cereals.  Dried beans and soy products.  Starchy vegetables, such as potatoes, peas, and corn.  Fruit and fruit juices.  Milk and yogurt.  Sweets and snack foods, such as cake, cookies, candy, chips, and soft drinks. How do I count carbohydrates? There are two ways to count carbohydrates in food. You can use either of the methods or a combination of both. Reading "Nutrition Facts" on packaged food The "Nutrition Facts" list is included on the labels of almost all packaged foods and beverages in the [place] It includes:  The serving size.  Information about nutrients in each serving, including the grams (g) of carbohydrate per serving. To use the "Nutrition Facts":  Decide how many servings you will have.  Multiply the number of servings by the number of carbohydrates per serving.  The resulting number is the total amount of carbohydrates that you will be having. Learning standard serving sizes of other foods When you eat carbohydrate foods that are not packaged or do not include "Nutrition Facts" on the label, you need to measure the servings in order to count the amount of carbohydrates:  Measure the foods that you will eat with a food scale or measuring cup, if needed.   Decide how many standard-size servings you will eat.  Multiply the number of servings by 15. Most carbohydrate-rich foods have about 15 g of carbohydrates per serving. ? For example, if you eat 8 oz (170 g) of strawberries, you will have eaten 2 servings and 30 g of carbohydrates (2 servings x 15 g = 30 g).  For foods that have more than one food mixed, such as soups and casseroles, you must count the carbohydrates in each food that is included. The following list contains standard serving sizes of common carbohydrate-rich foods. Each of these servings has about 15 g of carbohydrates:   hamburger bun or  English muffin.   oz (15 mL) syrup.   oz (14 g) jelly.  1 slice of bread.  1 six-inch tortilla.  3 oz (85 g) cooked rice or pasta.  4 oz (113 g) cooked dried beans.  4 oz (113 g) starchy vegetable, such as peas, corn, or potatoes.  4 oz (113 g) hot cereal.  4 oz (113 g) mashed potatoes or  of a large baked potato.  4 oz (113 g) canned or frozen fruit.  4 oz (120 mL) fruit juice.  4-6 crackers.  6 chicken nuggets.  6 oz (170 g) unsweetened dry cereal.  6 oz (170 g) plain fat-free yogurt or yogurt sweetened with artificial sweeteners.  8 oz (240 mL) milk.  8 oz (170 g) fresh fruit or one small piece of fruit.  24 oz (680 g) popped popcorn. Example of carbohydrate counting Sample meal  3 oz (85 g) chicken breast.  6 oz (170 g)   brown rice.  4 oz (113 g) corn.  8 oz (240 mL) milk.  8 oz (170 g) strawberries with sugar-free whipped topping. Carbohydrate calculation 1. Identify the foods that contain carbohydrates: ? Rice. ? Corn. ? Milk. ? Strawberries. 2. Calculate how many servings you have of each food: ? 2 servings rice. ? 1 serving corn. ? 1 serving milk. ? 1 serving strawberries. 3. Multiply each number of servings by 15 g: ? 2 servings rice x 15 g = 30 g. ? 1 serving corn x 15 g = 15 g. ? 1 serving milk x 15 g = 15 g. ? 1 serving  strawberries x 15 g = 15 g. 4. Add together all of the amounts to find the total grams of carbohydrates eaten: ? 30 g + 15 g + 15 g + 15 g = 75 g of carbohydrates total. Summary  Carbohydrate counting is a method of keeping track of how many carbohydrates you eat.  Eating carbohydrates naturally increases the amount of sugar (glucose) in the blood.  Counting how many carbohydrates you eat helps keep your blood glucose within normal limits, which helps you manage your diabetes.  A diet and nutrition specialist (registered dietitian) can help you make a meal plan and calculate how many carbohydrates you should have at each meal and snack. This information is not intended to replace advice given to you by your health care provider. Make sure you discuss any questions you have with your health care provider. Document Released: 06/28/2005 Document Revised: 01/05/2017 Document Reviewed: 12/10/2015 Elsevier Interactive Patient Education  2019 [employer]  

## 2018-08-24 NOTE — Assessment & Plan Note (Signed)
hgba1c to be checked, minimize simple carbs. Increase exercise as tolerated. Continue current meds  

## 2018-08-24 NOTE — Progress Notes (Signed)
Patient ID: [name], female    DOB: Aug 15, 1955  Age: 63 y.o. MRN: [id number]    Subjective:  Subjective  HPI [name] presents for f/u dm, chol and bp  Her bld sugars have been increasing since being on humira.    HYPERTENSION   Blood pressure range-not checking   Chest pain- no      Dyspnea- no Lightheadedness- no   Edema- no  Other side effects - no   Medication compliance: good Low salt diet- yes    DIABETES    Blood Sugar ranges-running high  Polyuria- no New Visual problems- no  Hypoglycemic symptoms- no  Other side effects-no Medication compliance - good Last eye exam- utd Foot exam- today   HYPERLIPIDEMIA  Medication compliance- good RUQ pain- no  Muscle aches- no Other side effects-no     Review of Systems  Constitutional: Negative for appetite change, diaphoresis, fatigue and unexpected weight change.  Eyes: Negative for pain, redness and visual disturbance.  Respiratory: Negative for cough, chest tightness, shortness of breath and wheezing.   Cardiovascular: Negative for chest pain, palpitations and leg swelling.  Endocrine: Negative for cold intolerance, heat intolerance, polydipsia, polyphagia and polyuria.  Genitourinary: Negative for difficulty urinating, dysuria and frequency.  Musculoskeletal: Positive for arthralgias.  Neurological: Negative for dizziness, light-headedness, numbness and headaches.    History Past Medical History:  Diagnosis Date  . Allergic rhinitis   . Anxiety   . Asthma    hx bronchial asthma at times with upper resp infection  . Depression   . Diabetes mellitus without complication (HCC)    diet controlled no meds  . Fibromyalgia   . GERD (gastroesophageal reflux disease)   . Headache(784.0)    migraine and cluster headaches  . Hyperlipemia   . Hypertension   . IBS (irritable bowel syndrome)   . Menopause   . Neuromuscular disorder ([place]) 2009   hx of fibromyalgia, polyarthralsia (surgery induced)  .  Stress incontinence    at times  . Tibial fracture 10/14/2016   evulsion periostial right    She has a past surgical history that includes Cesarean section ([place]); Cervical laminectomy (2005 & 2009); Foot surgery (2005); right knee (1981); Nasal sinus surgery ([place]); Laparoscopic gastric banding (12/30/09); Esophagogastroduodenoscopy (06/29/2011); Hysteroscopy (1999); laparoscopy (09/24/2011); Gastric banding port revision (09/24/2011); Tonsillectomy (1971); Cholecystectomy (1986); Tubal ligation (1993); right knee arthroscopy and arthrotomy ([id number]); Cervical conization w/bx (june 1990); laparoscopy (07/03/2012); Laparoscopic revision of gastric band (07/03/2012); Mesh applied to lap port (07/03/2012); Dilation and curettage of uterus ( april-1989); Laparoscopic repair and removal of gastric band (N/A, 07/02/2013); Laparoscopic gastric sleeve resection (N/A, 07/02/2013); Abdominal hysterectomy (12/1999); Gastric Roux-En-Y (N/A, 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   Her family history includes Atrial fibrillation in her mother; Breast cancer in her mother; COPD in an other family member; Cancer in her mother; Diabetes in her father and mother; Heart attack in her father; Heart disease in her unknown relative; Hypertension in her father and mother; Stroke in her father and mother; Thyroid disease in her unknown relative.She reports that she has never smoked. She has never used smokeless tobacco. She reports that she does not drink alcohol or use drugs.  Current Outpatient Medications on File Prior to Visit  Medication Sig Dispense Refill  . acetaminophen (TYLENOL 8 HOUR ARTHRITIS PAIN) 650 MG CR tablet Take 650 mg by mouth 2 (two) times daily as needed for pain.    . Adalimumab (HUMIRA PEN)  40 MG/0.4ML PNKT Inject 1 pen into the skin every 14 (fourteen) days. Inject 1 injection every other week under the skin 2 each 5  . benzonatate (TESSALON PERLES) 100 MG capsule Take  1 capsule (100 mg total) by mouth 3 (three) times daily as needed. 20 capsule 0  . Ca Phosphate-Cholecalciferol (CALTRATE GUMMY BITES PO) Take 2 each 2 (two) times daily by mouth.     . cyclobenzaprine (FLEXERIL) 10 MG tablet Take 1 tablet (10 mg total) by mouth 3 (three) times daily as needed for muscle spasms. 30 tablet 0  . Diclofenac Sodium (PENNSAID) 2 % SOLN Place 1 application onto the skin 2 (two) times daily as needed (pain).    Marland Kitchen estradiol (VIVELLE-DOT) 0.1 MG/24HR Place 1 patch (0.1 mg total) onto the skin 2 (two) times a week. Monday and Thursday (Patient taking differently: Place 1 patch onto the skin 2 (two) times a week. Mondays and Thursdays) 8 patch 11  . glucose blood (ONETOUCH VERIO) test strip Check blood sugar daily 100 each 12  . glucose blood test strip Test blood sugar once daily. Dx Code: E11.9 100 each 12  . HYDROcodone-homatropine (HYCODAN) 5-1.5 MG/5ML syrup Take 5 mLs by mouth every 6 (six) hours as needed. 100 mL 0  . loratadine (CLARITIN) 10 MG tablet Take 10 mg by mouth every evening.     . methocarbamol (ROBAXIN) 500 MG tablet Take 1 tablet (500 mg total) by mouth 4 (four) times daily. 10 tablet 1  . MISC NATURAL PRODUCTS PO Take 1,000 mg by mouth.     . mupirocin ointment (BACTROBAN) 2 % Apply thin film bid to area 22 g 0  . nitroGLYCERIN (NITRODUR - DOSED IN MG/24 HR) 0.2 mg/hr patch Apply 1/4th patch to affected shoulder, change daily 30 patch 1  . Pediatric Multivit-Minerals-C (ONE-A-DAY SCOOBY-DOO GUMMIES PO) Take 1 tablet 2 (two) times daily by mouth.     Marland Kitchen PREMARIN vaginal cream Apply 1 application topically 2 (two) times a week. Mondays & Thursdays.  1  . thiamine (VITAMIN B-1) 100 MG tablet Take 100 mg daily by mouth.     No current facility-administered medications on file prior to visit.      Objective:  Objective  Physical Exam Vitals signs and nursing note reviewed.  Constitutional:      Appearance: She is well-developed.  HENT:     Head:  Normocephalic and atraumatic.  Eyes:     Conjunctiva/sclera: Conjunctivae normal.  Neck:     Musculoskeletal: Normal range of motion and neck supple.     Thyroid: No thyromegaly.     Vascular: No carotid bruit or JVD.  Cardiovascular:     Rate and Rhythm: Normal rate and regular rhythm.     Heart sounds: Normal heart sounds. No murmur.  Pulmonary:     Effort: Pulmonary effort is normal. No respiratory distress.     Breath sounds: Normal breath sounds. No wheezing or rales.  Chest:     Chest wall: No tenderness.  Neurological:     Mental Status: She is alert and oriented to person, place, and time.    BP 136/68 (BP Location: Left Arm, Cuff Size: Normal)   Pulse 67   Temp 98.5 F (36.9 C) (Oral)   Resp 16   Ht 5\' 11"  (1.803 m)   Wt 197 lb 9.6 oz (89.6 kg)   SpO2 97%   BMI 27.56 kg/m  Wt Readings from Last 3 Encounters:  08/24/18 197 lb 9.6 oz (  89.6 kg)  07/26/18 197 lb 6.4 oz (89.5 kg)  07/14/18 194 lb (88 kg)     Lab Results  Component Value Date   WBC 8.8 07/26/2018   HGB 13.3 07/26/2018   HCT 40.4 07/26/2018   PLT 247.0 07/26/2018   GLUCOSE 87 05/02/2018   CHOL 135 05/02/2018   TRIG 86.0 05/02/2018   HDL 69.10 05/02/2018   LDLCALC 48 05/02/2018   ALT 29 05/02/2018   AST 16 05/02/2018   NA 139 05/02/2018   K 4.9 05/02/2018   CL 102 05/02/2018   CREATININE 0.73 05/02/2018   BUN 9 05/02/2018   CO2 34 (H) 05/02/2018   TSH 2.29 07/26/2018   HGBA1C 6.1 05/02/2018   MICROALBUR 0.1 08/01/2014    Dg Chest 2 View  Result Date: 07/26/2018 CLINICAL DATA:  Fever with cough and congestion EXAM: CHEST - 2 VIEW COMPARISON:  June 22, 2013 FINDINGS: Lungs are clear. Heart size and pulmonary vascularity are normal. No adenopathy. There is degenerative change in the thoracic spine. There is postoperative change in the lower cervical region. IMPRESSION: No edema or consolidation. Electronically Signed   By: Lowella Grip III M.D.   On: 07/26/2018 09:08   US  Thyroid  Result Date: 07/26/2018 CLINICAL DATA:  Palpable abnormality. 63 year old female with thyromegaly EXAM: THYROID ULTRASOUND TECHNIQUE: Ultrasound examination of the thyroid gland and adjacent soft tissues was performed. COMPARISON:  None. FINDINGS: Parenchymal Echotexture: Normal Isthmus: 0.2 cm Right lobe: 4.5 x 1.2 x 1.4 cm Left lobe: 3.7 x 1.2 x 1.4 cm _________________________________________________________ Estimated total number of nodules >/= 1 cm: 0 Number of spongiform nodules >/=  2 cm not described below (TR1): 0 Number of mixed cystic and solid nodules >/= 1.5 cm not described below (TR2): 0 _________________________________________________________ No discrete nodules are seen within the thyroid gland. IMPRESSION: Normal sonographic appearance of the thyroid gland. Electronically Signed   By: Jacqulynn Cadet M.D.   On: 07/26/2018 20:19     Assessment & Plan:  Plan  I have discontinued Charmaine Downs. Halter's ONETOUCH DELICA LANCETS FINE, BAYER CONTOUR LINK MONITOR, doxycycline, and levofloxacin. I am also having her maintain her loratadine, Ca Phosphate-Cholecalciferol (CALTRATE GUMMY BITES PO), Pediatric Multivit-Minerals-C (ONE-A-DAY SCOOBY-DOO GUMMIES PO), estradiol, glucose blood, PREMARIN, glucose blood, thiamine, acetaminophen, Diclofenac Sodium, Adalimumab, cyclobenzaprine, methocarbamol, nitroGLYCERIN, benzonatate, MISC NATURAL PRODUCTS PO, mupirocin ointment, HYDROcodone-homatropine, DULoxetine, furosemide, olmesartan, potassium chloride, rosuvastatin, and temazepam.  Meds ordered this encounter  Medications  . DULoxetine (CYMBALTA) 30 MG capsule    Sig: Take 2 capsules (60 mg total) by mouth daily.    Dispense:  180 capsule    Refill:  3  . furosemide (LASIX) 20 MG tablet    Sig: TAKE 1 TABLET BY MOUTH EVERY MORNING    Dispense:  90 tablet    Refill:  1  . olmesartan (BENICAR) 5 MG tablet    Sig: Take 1 tablet (5 mg total) by mouth daily.    Dispense:  90 tablet     Refill:  3  . potassium chloride (K-DUR,KLOR-CON) 10 MEQ tablet    Sig: TAKE 1 TABLET (10 MEQ TOTAL) BY MOUTH DAILY.    Dispense:  90 tablet    Refill:  1  . rosuvastatin (CRESTOR) 20 MG tablet    Sig: TAKE 1 TABLET (20 MG TOTAL) BY MOUTH AT BEDTIME.    Dispense:  90 tablet    Refill:  1  . DISCONTD: temazepam (RESTORIL) 30 MG capsule    Sig: Take 1  capsule (30 mg total) by mouth at bedtime as needed for sleep.    Dispense:  90 capsule    Refill:  0  . temazepam (RESTORIL) 30 MG capsule    Sig: Take 1 capsule (30 mg total) by mouth at bedtime as needed for sleep.    Dispense:  90 capsule    Refill:  0    Problem List Items Addressed This Visit      Unprioritized   Controlled type 2 diabetes mellitus with complication, without long-term current use of insulin ([place])    hgba1c to be checked, minimize simple carbs. Increase exercise as tolerated. Continue current meds       Relevant Medications   olmesartan (BENICAR) 5 MG tablet   rosuvastatin (CRESTOR) 20 MG tablet   Essential hypertension    Well controlled, no changes to meds. Encouraged heart healthy diet such as the DASH diet and exercise as tolerated.       Relevant Medications   furosemide (LASIX) 20 MG tablet   olmesartan (BENICAR) 5 MG tablet   potassium chloride (K-DUR,KLOR-CON) 10 MEQ tablet   rosuvastatin (CRESTOR) 20 MG tablet   Hyperlipidemia   Relevant Medications   furosemide (LASIX) 20 MG tablet   olmesartan (BENICAR) 5 MG tablet   rosuvastatin (CRESTOR) 20 MG tablet   Hyperlipidemia associated with type 2 diabetes mellitus (HCC)    Tolerating statin, encouraged heart healthy diet, avoid trans fats, minimize simple carbs and saturated fats. Increase exercise as tolerated      Relevant Medications   olmesartan (BENICAR) 5 MG tablet   rosuvastatin (CRESTOR) 20 MG tablet    Other Visit Diagnoses    Diabetes mellitus due to underlying condition, uncontrolled, with hyperglycemia, without long-term current  use of insulin (HCC)    -  Primary   Relevant Medications   olmesartan (BENICAR) 5 MG tablet   rosuvastatin (CRESTOR) 20 MG tablet   Rheumatoid arthritis involving multiple sites with positive rheumatoid factor (HCC)       Anxiety       Relevant Medications   DULoxetine (CYMBALTA) 30 MG capsule   Insomnia, unspecified type       Relevant Medications   temazepam (RESTORIL) 30 MG capsule   Preventative health care       Diabetes mellitus without complication (HCC)       Relevant Medications   olmesartan (BENICAR) 5 MG tablet   rosuvastatin (CRESTOR) 20 MG tablet      Follow-up: Return in about 6 months (around 02/22/2019) for annual exam, fasting.  [name], DO

## 2018-08-28 ENCOUNTER — Encounter: Payer: Self-pay | Admitting: Family Medicine

## 2018-08-28 ENCOUNTER — Ambulatory Visit: Payer: 59 | Admitting: Family Medicine

## 2018-08-28 VITALS — BP 135/87 | HR 73 | Ht 71.0 in | Wt 196.0 lb

## 2018-08-28 DIAGNOSIS — M25511 Pain in right shoulder: Secondary | ICD-10-CM | POA: Diagnosis not present

## 2018-08-28 DIAGNOSIS — M25512 Pain in left shoulder: Secondary | ICD-10-CM

## 2018-08-28 MED FILL — TEMAZEPAM 30 MG CAPSULE: 30 | 90 days supply | Qty: 90 | Fill #0

## 2018-08-28 NOTE — Progress Notes (Signed)
PCP: [name], DO  Subjective:   HPI: Patient is a 63 y.o. female here for bilateral shoulder pain.  06/01/18: Patient reports she's had off and on bilateral shoulder pain since February. She was having multiple other joint pains as well, diagnosed with RA and started on humira. This has helped most of her joint issues but shoulder pain persists. Pain right shoulder 9/10 and sharp, 5/10 on left. + night pain. Worse reaching behind and with cold weather. Thinks this occurred related to lifting boxes occasionally at work. She had injections back in June which resolved her pain but it has come back. She's taking robaxin, using heat. No skin changes, numbness.  07/14/18: Patient reports subacromial injections resolved her pain until about 1 week ago Now pain has returned at 8/10 level on right, 6/10 on left laterally, sharp and worse with reaching back, overhead. Using nitro patches, doing home exercises. Pulling covers up in bed also bothers her. No skin changes, numbness.  2/17: Patient here for follow-up fo bilateral shoulder pain secondary to rotator cuff tendinopathy. Reports 0/10 pain bilaterally.  She went to 1 session of physical therapy and has been doing home exercises since then.  She has been wearing nitroglycerin patches as well.  She denies any pain or physical limitations.  No numbness or tingling.  No weakness.  Past Medical History:  Diagnosis Date  . Allergic rhinitis   . Anxiety   . Asthma    hx bronchial asthma at times with upper resp infection  . Depression   . Diabetes mellitus without complication (HCC)    diet controlled no meds  . Fibromyalgia   . GERD (gastroesophageal reflux disease)   . Headache(784.0)    migraine and cluster headaches  . Hyperlipemia   . Hypertension   . IBS (irritable bowel syndrome)   . Menopause   . Neuromuscular disorder ([place]) 2009   hx of fibromyalgia, polyarthralsia (surgery induced)  . Stress incontinence     at times  . Tibial fracture 10/14/2016   evulsion periostial right    Current Outpatient Medications on File Prior to Visit  Medication Sig Dispense Refill  . acetaminophen (TYLENOL 8 HOUR ARTHRITIS PAIN) 650 MG CR tablet Take 650 mg by mouth 2 (two) times daily as needed for pain.    . Adalimumab (HUMIRA PEN) 40 MG/0.4ML PNKT Inject 1 pen into the skin every 14 (fourteen) days. Inject 1 injection every other week under the skin 2 each 5  . benzonatate (TESSALON PERLES) 100 MG capsule Take 1 capsule (100 mg total) by mouth 3 (three) times daily as needed. 20 capsule 0  . Ca Phosphate-Cholecalciferol (CALTRATE GUMMY BITES PO) Take 2 each 2 (two) times daily by mouth.     . cyclobenzaprine (FLEXERIL) 10 MG tablet Take 1 tablet (10 mg total) by mouth 3 (three) times daily as needed for muscle spasms. 30 tablet 0  . Diclofenac Sodium (PENNSAID) 2 % SOLN Place 1 application onto the skin 2 (two) times daily as needed (pain).    . DULoxetine (CYMBALTA) 30 MG capsule Take 2 capsules (60 mg total) by mouth daily. 180 capsule 3  . estradiol (VIVELLE-DOT) 0.1 MG/24HR Place 1 patch (0.1 mg total) onto the skin 2 (two) times a week. Monday and Thursday (Patient taking differently: Place 1 patch onto the skin 2 (two) times a week. Mondays and Thursdays) 8 patch 11  . furosemide (LASIX) 20 MG tablet TAKE 1 TABLET BY MOUTH EVERY MORNING 90 tablet 1  .  glucose blood (ONETOUCH VERIO) test strip Check blood sugar daily 100 each 12  . glucose blood test strip Test blood sugar once daily. Dx Code: E11.9 100 each 12  . HYDROcodone-homatropine (HYCODAN) 5-1.5 MG/5ML syrup Take 5 mLs by mouth every 6 (six) hours as needed. 100 mL 0  . loratadine (CLARITIN) 10 MG tablet Take 10 mg by mouth every evening.     . methocarbamol (ROBAXIN) 500 MG tablet Take 1 tablet (500 mg total) by mouth 4 (four) times daily. 10 tablet 1  . MISC NATURAL PRODUCTS PO Take 1,000 mg by mouth.     . mupirocin ointment (BACTROBAN) 2 % Apply  thin film bid to area 22 g 0  . nitroGLYCERIN (NITRODUR - DOSED IN MG/24 HR) 0.2 mg/hr patch Apply 1/4th patch to affected shoulder, change daily 30 patch 1  . olmesartan (BENICAR) 5 MG tablet Take 1 tablet (5 mg total) by mouth daily. 90 tablet 3  . Pediatric Multivit-Minerals-C (ONE-A-DAY SCOOBY-DOO GUMMIES PO) Take 1 tablet 2 (two) times daily by mouth.     . potassium chloride (K-DUR,KLOR-CON) 10 MEQ tablet TAKE 1 TABLET (10 MEQ TOTAL) BY MOUTH DAILY. 90 tablet 1  . PREMARIN vaginal cream Apply 1 application topically 2 (two) times a week. Mondays & Thursdays.  1  . rosuvastatin (CRESTOR) 20 MG tablet TAKE 1 TABLET (20 MG TOTAL) BY MOUTH AT BEDTIME. 90 tablet 1  . temazepam (RESTORIL) 30 MG capsule Take 1 capsule (30 mg total) by mouth at bedtime as needed for sleep. 90 capsule 0  . thiamine (VITAMIN B-1) 100 MG tablet Take 100 mg daily by mouth.     No current facility-administered medications on file prior to visit.     Past Surgical History:  Procedure Laterality Date  . ABDOMINAL HYSTERECTOMY  12/1999   complete  . CARPAL TUNNEL RELEASE Right 12/27/2017   Procedure: CARPAL TUNNEL RELEASE;  Surgeon: Roseanne Kaufman, MD;  Location: [facility];  Service: Orthopedics;  Laterality: Right;  . CERVICAL CONIZATION W/BX  june 1990   dysplasia of cervix/used 5Fu cream for 3 months  . CERVICAL LAMINECTOMY  2005 & 2009   X 2   NO ROM PROBLEMS  . [place]  . DILATION AND CURETTAGE OF UTERUS   [id number]   missed abortion  . ESOPHAGOGASTRODUODENOSCOPY  06/29/2011   Procedure: ESOPHAGOGASTRODUODENOSCOPY (EGD);  Surgeon: Shann Medal, MD;  Location: [facility];  Service: General;  Laterality: N/A;  . FOOT SURGERY  2005   RT HEEL  . GASTRIC BANDING PORT REVISION  09/24/2011   Procedure: GASTRIC BANDING PORT REVISION;  Surgeon: Pedro Earls, MD;  Location: [facility];  Service: General;  Laterality: N/A;  . GASTRIC ROUX-EN-Y N/A 06/06/2017    Procedure: Conversion from [place]lee[place] to [place];  Surgeon: Johnathan Hausen, MD;  Location: [facility];  Service: General;  Laterality: N/A;  . HYSTEROSCOPY  1999  . LAPAROSCOPIC GASTRIC BANDING  12/30/09  . LAPAROSCOPIC GASTRIC SLEEVE RESECTION N/A 07/02/2013   Procedure: LAPAROSCOPIC GASTRIC SLEEVE RESECTION upper endoscopy;  Surgeon: Pedro Earls, MD;  Location: [facility];  Service: General;  Laterality: N/A;  . LAPAROSCOPIC REPAIR AND REMOVAL OF GASTRIC BAND N/A 07/02/2013   Procedure: LAPAROSCOPIC REMOVAL OF GASTRIC BAND;  Surgeon: Pedro Earls, MD;  Location: [facility];  Service: General;  Laterality: N/A;  . LAPAROSCOPIC REVISION OF GASTRIC BAND  07/03/2012  Procedure: LAPAROSCOPIC REVISION OF GASTRIC BAND;  Surgeon: Pedro Earls, MD;  Location: [facility];  Service: General;  Laterality: N/A;  removal of old lap. band port, replaced with AP standard band  . LAPAROSCOPY  09/24/2011   Procedure: LAPAROSCOPY DIAGNOSTIC;  Surgeon: Pedro Earls, MD;  Location: [facility];  Service: General;  Laterality: N/A;  . LAPAROSCOPY  07/03/2012   Procedure: LAPAROSCOPY DIAGNOSTIC;  Surgeon: Pedro Earls, MD;  Location: [facility];  Service: General;  Laterality: N/A;  Exploratory Laparoscopy   . MESH APPLIED TO LAP PORT  07/03/2012   Procedure: MESH APPLIED TO LAP PORT;  Surgeon: Pedro Earls, MD;  Location: [facility];  Service: General;  Laterality: N/A;  . [place]   X 2  . right knee  1981   ARTHROSCOPY AND ARTHROTOMY  . right knee arthroscopy and arthrotomy  12-1979  . TONSILLECTOMY  1971  . TUBAL LIGATION  1993   WITH C -SECTION    Allergies  Allergen Reactions  . Isoniazid     Severe SOB  . Nitrofurantoin Shortness Of Breath    REACTION: welts  . Hctz [Hydrochlorothiazide]     rash  . Promethazine Hcl [Promethazine Hcl]     IF GIVEN  IV-hallucinations     CAN TAKE PO PHENERGAN  . Tamiflu [Oseltamivir Phosphate]  Nausea And Vomiting    "I vomited within 30 minutes of taking it and was told I cannot ever take it again."  . Macrolides And Ketolides     rash  . Morphine Other (See Comments)    severe vomiting  . Percocet [Oxycodone-Acetaminophen]     REACTION: severe itching. Can take by mouth codeine  . Roxicet [Oxycodone-Acetaminophen]     itching  . Sulfamethoxazole-Trimethoprim     Septra / Bactrim  --REACTION: rash  . Fentanyl Hives and Rash    REACTION: welts    Social History   Socioeconomic History  . Marital status: Married    Spouse name: Not on file  . Number of children: Not on file  . Years of education: Not on file  . Highest education level: Not on file  Occupational History  . Occupation: Programmer, multimedia: [employer]  Social Needs  . Financial resource strain: Not on file  . Food insecurity:    Worry: Not on file    Inability: Not on file  . Transportation needs:    Medical: Not on file    Non-medical: Not on file  Tobacco Use  . Smoking status: Never Smoker  . Smokeless tobacco: Never Used  Substance and Sexual Activity  . Alcohol use: No    Alcohol/week: 0.0 standard drinks  . Drug use: No  . Sexual activity: Yes    Birth control/protection: None  Lifestyle  . Physical activity:    Days per week: Not on file    Minutes per session: Not on file  . Stress: Not on file  Relationships  . Social connections:    Talks on phone: Not on file    Gets together: Not on file    Attends religious service: Not on file    Active member of club or organization: Not on file    Attends meetings of clubs or organizations: Not on file    Relationship status: Not on file  . Intimate partner violence:    Fear of current or ex partner: Not on file    Emotionally  abused: Not on file    Physically abused: Not on file    Forced sexual activity: Not on file  Other Topics Concern  . Not on file  Social History Narrative  . Not on file    Family History   Problem Relation Age of Onset  . Diabetes Mother   . Stroke Mother   . Breast cancer Mother   . Atrial fibrillation Mother   . Hypertension Mother   . Cancer Mother        breast  . Diabetes Father   . Stroke Father   . Hypertension Father   . Heart attack Father   . Thyroid disease Unknown   . Heart disease Unknown   . COPD Other     There were no vitals taken for this visit.  Review of Systems: See HPI above.     Objective:  Physical Exam:  GEN: Awake, alert, no acute distress  Bilateral shoulders: No obvious deformity or asymmetry. No bruising. No swelling No TTP Full ROM in flexion, abduction, internal/external rotation NV intact distally 5/5 strength with rotator cuff strength testing without pain.    Assessment & Plan:  1.  Bilateral shoulder pain- resolved.  Patient has normal strength and full range of motion. -Recommend she continue the rehab exercises as part of a daily routine few days per week. -Follow-up as needed

## 2018-08-29 ENCOUNTER — Encounter: Payer: Self-pay | Admitting: Family Medicine

## 2018-08-29 ENCOUNTER — Encounter: Payer: Self-pay | Admitting: *Deleted

## 2018-08-29 ENCOUNTER — Other Ambulatory Visit: Payer: Self-pay | Admitting: Family Medicine

## 2018-08-29 DIAGNOSIS — E0865 Diabetes mellitus due to underlying condition with hyperglycemia: Secondary | ICD-10-CM

## 2018-08-29 DIAGNOSIS — I10 Essential (primary) hypertension: Secondary | ICD-10-CM

## 2018-08-29 DIAGNOSIS — E118 Type 2 diabetes mellitus with unspecified complications: Secondary | ICD-10-CM

## 2018-08-29 DIAGNOSIS — E1169 Type 2 diabetes mellitus with other specified complication: Secondary | ICD-10-CM

## 2018-08-29 DIAGNOSIS — E785 Hyperlipidemia, unspecified: Secondary | ICD-10-CM

## 2018-08-31 ENCOUNTER — Telehealth: Payer: Self-pay | Admitting: *Deleted

## 2018-08-31 NOTE — Telephone Encounter (Signed)
Copied from [place] [id number]. Topic: General - Other >> Aug 31, 2018  2:45 PM Virl Axe D wrote: Pt stated she talked about FMLA for fibromyalgia and joint pain with Dr. Etter Sjogren at her last OV. She spoke with [employer] and had them fax over paperwork on 08/28/18 for Dr. Etter Sjogren to fill out. Please advise and contact pt with questions or when paperwork has been sent back to [employer].

## 2018-09-01 NOTE — Telephone Encounter (Signed)
Patient notified that we did receive paperwork   She would like for you to put 4 times a month, 2 days for each occurrence.  Paperwork is in red folder

## 2018-09-01 NOTE — Telephone Encounter (Signed)
Ok to change but I do not have paperwork

## 2018-09-01 NOTE — Telephone Encounter (Addendum)
Received FMLA/STD paperwork from [employer]; informed provider MA, will work on next week and complete as much as possible; then forward to provider/SLS 02/21

## 2018-09-01 NOTE — Telephone Encounter (Signed)
Have you received anything?

## 2018-09-04 MED FILL — METHOCARBAMOL 500 MG TABLET: 500 | 2 days supply | Qty: 10 | Fill #1

## 2018-09-05 NOTE — Telephone Encounter (Signed)
FMLA/STD paperwork from [employer], completed as much as possible; forwarded to provider/SLS 02/25

## 2018-09-06 ENCOUNTER — Encounter: Payer: Self-pay | Admitting: Family Medicine

## 2018-09-07 ENCOUNTER — Encounter: Payer: Self-pay | Admitting: Pharmacist

## 2018-09-07 ENCOUNTER — Ambulatory Visit (INDEPENDENT_AMBULATORY_CARE_PROVIDER_SITE_OTHER): Payer: 59 | Admitting: Pharmacist

## 2018-09-07 DIAGNOSIS — Z79899 Other long term (current) drug therapy: Secondary | ICD-10-CM

## 2018-09-07 DIAGNOSIS — M2550 Pain in unspecified joint: Secondary | ICD-10-CM | POA: Diagnosis not present

## 2018-09-07 DIAGNOSIS — E119 Type 2 diabetes mellitus without complications: Secondary | ICD-10-CM | POA: Diagnosis not present

## 2018-09-07 DIAGNOSIS — Z9884 Bariatric surgery status: Secondary | ICD-10-CM | POA: Diagnosis not present

## 2018-09-07 DIAGNOSIS — M130 Polyarthritis, unspecified: Secondary | ICD-10-CM | POA: Diagnosis not present

## 2018-09-07 DIAGNOSIS — Z6828 Body mass index (BMI) 28.0-28.9, adult: Secondary | ICD-10-CM | POA: Diagnosis not present

## 2018-09-07 DIAGNOSIS — E663 Overweight: Secondary | ICD-10-CM | POA: Diagnosis not present

## 2018-09-07 DIAGNOSIS — M0609 Rheumatoid arthritis without rheumatoid factor, multiple sites: Secondary | ICD-10-CM | POA: Diagnosis not present

## 2018-09-07 MED ORDER — ETANERCEPT 50 MG/ML ~~LOC~~ SOAJ: 50.0000 mg | SUBCUTANEOUS | 2 refills | Status: DC

## 2018-09-07 NOTE — Progress Notes (Signed)
   S: Patient presents to [facility] for review of their specialty medication therapy.  Patient is currently taking Enbrel for rheumatoid arthritis. Patient is managed by Marella Chimes for this.   Adherence: denies any missed doses of Humira when she was on that, has not started the Enbrel yet.   Efficacy: has not started yet. Humira did work for her but it increased her blood sugars so her rheumatologist decided to change her to Enbrel.   Dosing: 50 mg weekly  Ankylosing spondylitis, psoriatic arthritis, rheumatoid arthritis: SubQ: Note: May continue methotrexate, glucocorticoids, salicylates, NSAIDs, or analgesics during etanercept therapy. Once-weekly dosing: 50 mg once weekly; maximum dose (rheumatoid arthritis): 50 mg/week. Twice-weekly dosing (off-label dose): 25 mg twice weekly (Bathon 2000; Calin 2004; Davis 2003; Genovese 2002; Mease 2000; Mease 2002)  Screening: TB test: completed per patient Hepatitis B: completed per patient  Monitoring: Injection site reactions: has not started yet S/sx of infections: denies S/sx of malignancy: denies GI upset: has not started yet    O:     Lab Results  Component Value Date   WBC 8.9 08/24/2018   HGB 13.8 08/24/2018   HCT 42.4 08/24/2018   MCV 87.3 08/24/2018   PLT 259.0 08/24/2018      Chemistry      Component Value Date/Time   NA 140 08/24/2018 1023   K 5.3 (H) 08/24/2018 1023   CL 100 08/24/2018 1023   CO2 34 (H) 08/24/2018 1023   BUN 11 08/24/2018 1023   CREATININE 0.77 08/24/2018 1023      Component Value Date/Time   CALCIUM 9.8 08/24/2018 1023   ALKPHOS 78 08/24/2018 1023   AST 25 08/24/2018 1023   ALT 48 (H) 08/24/2018 1023   BILITOT 0.7 08/24/2018 1023       A/P: 1. Medication review: patient currently on Enbrel for rheumatoid arthritis but she has not started it yet. Reviewed the medication with the patient, including the following: Enbrel (etanercept) binds tumor necrosis factor (TNF) and blocks  its interaction with cell surface receptors. TNF plays an important role in the inflammatory processes of many diseases. Patient educated on purpose, proper use and potential adverse effects of Enbrel. Adverse effects include rash, GI upset, increased risk of infection, and injection site reactions. Patients should stop Enbrel if they develop a serious infection. There is a possible increased risk in lymphoma and other malignancies. No recommendations for any changes.    Christella Hartigan, PharmD, BCPS, BCACP, CPP Clinical Pharmacist Practitioner  [phone]

## 2018-09-11 MED FILL — ENBREL SURECLICK 50 MG/ML S: 50 | 28 days supply | Qty: 4 | Fill #0

## 2018-09-26 MED FILL — DOTTI 0.1 MG/24HR PTTW: 0.1 | 84 days supply | Qty: 24 | Fill #0

## 2018-09-29 MED FILL — DULoxetine HCL 30 MG CPEP: 30 | 90 days supply | Qty: 180 | Fill #0

## 2018-10-03 ENCOUNTER — Other Ambulatory Visit: Payer: Self-pay | Admitting: Family Medicine

## 2018-10-03 ENCOUNTER — Encounter: Payer: Self-pay | Admitting: Family Medicine

## 2018-10-03 DIAGNOSIS — L03119 Cellulitis of unspecified part of limb: Secondary | ICD-10-CM

## 2018-10-03 MED ORDER — DOXYCYCLINE HYCLATE 100 MG PO TABS: 100.0000 mg | ORAL_TABLET | Freq: Two times a day (BID) | ORAL | 0 refills | Status: DC

## 2018-10-03 MED FILL — DOXYCYCLINE HYCLATE 100 MG: 100 | 10 days supply | Qty: 20 | Fill #0

## 2018-10-04 ENCOUNTER — Other Ambulatory Visit: Payer: Self-pay

## 2018-10-04 ENCOUNTER — Ambulatory Visit (INDEPENDENT_AMBULATORY_CARE_PROVIDER_SITE_OTHER): Payer: 59

## 2018-10-04 ENCOUNTER — Other Ambulatory Visit: Payer: Self-pay | Admitting: Medical

## 2018-10-04 DIAGNOSIS — Z23 Encounter for immunization: Secondary | ICD-10-CM

## 2018-10-05 ENCOUNTER — Ambulatory Visit (INDEPENDENT_AMBULATORY_CARE_PROVIDER_SITE_OTHER): Payer: 59 | Admitting: Family Medicine

## 2018-10-05 ENCOUNTER — Encounter: Payer: Self-pay | Admitting: Family Medicine

## 2018-10-05 DIAGNOSIS — W540XXA Bitten by dog, initial encounter: Secondary | ICD-10-CM | POA: Diagnosis not present

## 2018-10-05 DIAGNOSIS — S41151A Open bite of right upper arm, initial encounter: Secondary | ICD-10-CM | POA: Diagnosis not present

## 2018-10-05 MED ORDER — MUPIROCIN 2 % EX OINT: TOPICAL_OINTMENT | CUTANEOUS | 0 refills | Status: DC

## 2018-10-05 MED FILL — MUPIROCIN 2% OINTMENT: 2 | 30 days supply | Qty: 22 | Fill #0

## 2018-10-05 NOTE — Progress Notes (Signed)
Virtual Visit via Video Note  I connected with Geri Seminole on 10/05/18 at  8:30 AM EDT by a video enabled telemedicine application and verified that I am speaking with the correct person using two identifiers.   I discussed the limitations of evaluation and management by telemedicine and the availability of in person appointments. The patient expressed understanding and agreed to proceed.  History of Present Illness: Pt f/u on cellulitis R forearm.  She started the doxy yesterday and it does look better today She would like a refill on the bactroban  Observations/Objective: R forearm -- wound is red but less red per pt  Nothing draining  Pt is keeping it wrapped with abx oint on it  Assessment and Plan: 1. Dog bite of arm, right, initial encounter abx started yesterday and tdap given  Call or make ov if symptoms do not improve Pt bit by her dog-- utd on shots  Follow Up Instructions:    I discussed the assessment and treatment plan with the patient. The patient was provided an opportunity to ask questions and all were answered. The patient agreed with the plan and demonstrated an understanding of the instructions.   The patient was advised to call back or seek an in-person evaluation if the symptoms worsen or if the condition fails to improve as anticipated.  I provided 25 minutes of non-face-to-face time during this encounter.   [name], DO

## 2018-10-05 NOTE — Assessment & Plan Note (Signed)
Pt started abx yesterday tdap given yesterday Ointment refilled

## 2018-10-12 ENCOUNTER — Encounter: Payer: Self-pay | Admitting: Family Medicine

## 2018-10-12 ENCOUNTER — Other Ambulatory Visit: Payer: Self-pay | Admitting: Family Medicine

## 2018-10-12 DIAGNOSIS — M5412 Radiculopathy, cervical region: Secondary | ICD-10-CM

## 2018-10-12 MED ORDER — CYCLOBENZAPRINE HCL 10 MG PO TABS: 10.0000 mg | ORAL_TABLET | Freq: Three times a day (TID) | ORAL | 0 refills | Status: DC | PRN

## 2018-10-12 MED FILL — CYCLOBENZAPRINE HCL 10 MG T: 10 | 10 days supply | Qty: 30 | Fill #0

## 2018-10-12 NOTE — Telephone Encounter (Signed)
Refill sent.

## 2018-10-23 ENCOUNTER — Encounter: Payer: Self-pay | Admitting: Family Medicine

## 2018-10-23 ENCOUNTER — Other Ambulatory Visit: Payer: Self-pay | Admitting: Family Medicine

## 2018-10-23 DIAGNOSIS — M5412 Radiculopathy, cervical region: Secondary | ICD-10-CM

## 2018-10-23 MED ORDER — METHOCARBAMOL 500 MG PO TABS: 500.0000 mg | ORAL_TABLET | Freq: Four times a day (QID) | ORAL | 1 refills | Status: DC

## 2018-10-23 MED FILL — ENBREL SURECLICK 50 MG/ML S: 50 | 28 days supply | Qty: 4 | Fill #1

## 2018-10-23 MED FILL — METHOCARBAMOL 500 MG TABLET: 500 | 11 days supply | Qty: 45 | Fill #0

## 2018-10-23 NOTE — Telephone Encounter (Signed)
You have refilled the Robaxin already, but are we doing the refills on Enbrel?

## 2018-10-23 NOTE — Telephone Encounter (Signed)
Requested medication (s) are due for refill today: yes  Requested medication (s) are on the active medication list: yes  Last refill:  10/23/18  Future visit scheduled: yes  Notes to clinic:  Not delegated    Requested Prescriptions  Pending Prescriptions Disp Refills   methocarbamol (ROBAXIN) 500 MG tablet 45 tablet 1    Sig: Take 1 tablet (500 mg total) by mouth 4 (four) times daily.     Not Delegated - Analgesics:  Muscle Relaxants Failed - 10/23/2018 11:22 AM      Failed - This refill cannot be delegated      Passed - Valid encounter within last 6 months    Recent Outpatient Visits          2 weeks ago Dog bite of arm, right, initial encounter   [facility], DO   2 months ago Diabetes mellitus due to underlying condition, uncontrolled, with hyperglycemia, without long-term current use of insulin ([place])   [facility] at [place], [place], DO   2 months ago [place] at [place], PA-C   5 months ago Controlled type 2 diabetes mellitus with complication, without long-term current use of insulin ([place])   [facility]ivist at [place] [facility], DO   6 months ago Arthralgia, unspecified joint   Archivist at [place], NP      Future Appointments            In 2 months [place], [place] at [employer], [facility]

## 2018-10-23 NOTE — Telephone Encounter (Signed)
Do you want to do more than 10 tabs?

## 2018-10-25 NOTE — Telephone Encounter (Signed)
See pt's response above stating Enbrel is prescribed by specialist Harrel Carina).

## 2018-10-31 ENCOUNTER — Encounter: Payer: 59 | Admitting: Family Medicine

## 2018-11-21 ENCOUNTER — Other Ambulatory Visit: Payer: Self-pay | Admitting: Pharmacist

## 2018-11-21 ENCOUNTER — Other Ambulatory Visit: Payer: Self-pay

## 2018-11-21 ENCOUNTER — Ambulatory Visit (INDEPENDENT_AMBULATORY_CARE_PROVIDER_SITE_OTHER): Payer: 59 | Admitting: Pharmacist

## 2018-11-21 DIAGNOSIS — E119 Type 2 diabetes mellitus without complications: Secondary | ICD-10-CM | POA: Diagnosis not present

## 2018-11-21 DIAGNOSIS — M0609 Rheumatoid arthritis without rheumatoid factor, multiple sites: Secondary | ICD-10-CM | POA: Diagnosis not present

## 2018-11-21 DIAGNOSIS — Z79899 Other long term (current) drug therapy: Secondary | ICD-10-CM | POA: Diagnosis not present

## 2018-11-21 DIAGNOSIS — M130 Polyarthritis, unspecified: Secondary | ICD-10-CM | POA: Diagnosis not present

## 2018-11-21 DIAGNOSIS — M2550 Pain in unspecified joint: Secondary | ICD-10-CM | POA: Diagnosis not present

## 2018-11-21 DIAGNOSIS — Z9884 Bariatric surgery status: Secondary | ICD-10-CM | POA: Diagnosis not present

## 2018-11-21 DIAGNOSIS — Z7189 Other specified counseling: Secondary | ICD-10-CM | POA: Diagnosis not present

## 2018-11-21 MED ORDER — ABATACEPT 125 MG/ML ~~LOC~~ SOAJ: 1.0000 mL | SUBCUTANEOUS | 5 refills | Status: DC

## 2018-11-21 NOTE — Progress Notes (Signed)
   S: Patient presents for review of their specialty medication therapy.  Patient is currently taking Orencia for rheumatoid arthritis. Patient is managed by Marella Chimes for this. She failed Enbrel because it was not controlling her RA and previously failed Humira due to elevated CBGs.   Adherence: has not started yet   FDA-approved dosing: SubQ: 125 mg subQ once weekly. If initiating with an IV loading dose, administer the initial IV infusion per weight based dosing, then administer 125 mg subQ within 24 hours of infusion, followed by 125 mg subQ once weekly thereafter. If transitioning from IV therapy to subQ therapy: administer the first subQ dose instead of the next scheduled IV dose.   Dose adjustments: Renal impairment: none Hepatic impairment: none Toxicity: discontinue if serious infection develops  Screenings: TB screening: Hepatitis Screening: Blood glucose: Orencia contains maltose which make falsely elevate glucose levels   Monitoring: S/sx of infection: denies, has had two episodes of cellulitis but they have resolved S/sx of hypersensitivity: has not started med Other adverse effects: has not started med  O:     Lab Results  Component Value Date   WBC 8.9 08/24/2018   HGB 13.8 08/24/2018   HCT 42.4 08/24/2018   MCV 87.3 08/24/2018   PLT 259.0 08/24/2018      Chemistry      Component Value Date/Time   NA 140 08/24/2018 1023   K 5.3 (H) 08/24/2018 1023   CL 100 08/24/2018 1023   CO2 34 (H) 08/24/2018 1023   BUN 11 08/24/2018 1023   CREATININE 0.77 08/24/2018 1023      Component Value Date/Time   CALCIUM 9.8 08/24/2018 1023   ALKPHOS 78 08/24/2018 1023   AST 25 08/24/2018 1023   ALT 48 (H) 08/24/2018 1023   BILITOT 0.7 08/24/2018 1023       A/P: 1. Medication review: Patient currently on [place] for the treatment of rheumatoid arthritis but has not started it yet. Reviewed the medication with the patient, including the following: Orencia is a  selective T-cell costimulation blocker indicated for rheumatoid arthritis. Patient educated on purpose, proper use and potential adverse effects of Orencia. The most common adverse effects are infections, headache, and injection site reactions. There is a possible adverse effect of increased risk of malignancy but it is not fully understood if this is due to the drug or the disease state itself. The patient was instructed to avoid use of live vaccinations without the approval of a physician. IV: Infuse over 30 minutes. Administer through a 0.2 to 1.2 micron low protein-binding filter. SubQ: Allow prefilled syringe and autoinjector to warm to room temperature (for 30 to 60 minutes and 30 minutes, respectively) prior to administration. Inject into the front of the thigh (preferred), abdomen (except for 2-inch area around the navel), or the outer area of the upper arms (if administered by a caregiver). Rotate injection sites (?1 inch apart); do not administer into tender, bruised, red, or hard skin. Made patient aware about the maltose and possible falsely elevated blood sugars.  No recommendations for any changes.  Christella Hartigan, PharmD, BCPS, BCACP, CPP Clinical Pharmacist Practitioner  [phone]

## 2018-11-23 MED FILL — HYDROXYCHLOROQUINE SULFATE: 200 | 30 days supply | Qty: 60 | Fill #0

## 2018-11-23 MED FILL — ENBREL SURECLICK 50 MG/ML S: 50 | 28 days supply | Qty: 4 | Fill #2

## 2018-11-24 ENCOUNTER — Other Ambulatory Visit: Payer: Self-pay | Admitting: Family Medicine

## 2018-11-24 ENCOUNTER — Encounter: Payer: Self-pay | Admitting: Family Medicine

## 2018-11-24 DIAGNOSIS — M5412 Radiculopathy, cervical region: Secondary | ICD-10-CM

## 2018-11-24 MED FILL — ROSUVASTATIN CALCIUM 20 MG: 20 | 90 days supply | Qty: 90 | Fill #1

## 2018-11-24 MED FILL — POTASSIUM CHL ER M10 TABLET: 10 | 90 days supply | Qty: 90 | Fill #1

## 2018-11-24 MED FILL — METHOCARBAMOL 500 MG TABLET: 500 | 11 days supply | Qty: 45 | Fill #1

## 2018-11-24 MED FILL — OLMESARTAN MEDOXOMIL 5 MG T: 5 | 90 days supply | Qty: 90 | Fill #2

## 2018-11-27 ENCOUNTER — Other Ambulatory Visit: Payer: Self-pay | Admitting: Family Medicine

## 2018-11-27 ENCOUNTER — Encounter: Payer: Self-pay | Admitting: Family Medicine

## 2018-11-27 DIAGNOSIS — G4700 Insomnia, unspecified: Secondary | ICD-10-CM

## 2018-11-27 MED FILL — CYCLOBENZAPRINE HCL 10 MG T: 10 | 10 days supply | Qty: 30 | Fill #0

## 2018-11-27 MED FILL — TEMAZEPAM 30 MG CAPSULE: 30 | 90 days supply | Qty: 90 | Fill #0

## 2018-11-27 NOTE — Telephone Encounter (Signed)
Duplicate request

## 2018-11-27 NOTE — Telephone Encounter (Signed)
Patient is calling in stating she only 2 pills and is wondering if it can be filled with extra refills on it so she can sleep.

## 2018-11-27 NOTE — Telephone Encounter (Signed)
Temazepam refill.   Last OV: 10/05/2018 Last Fill: 08/24/2018 #90 and 0RF UDS: None seen

## 2018-12-05 DIAGNOSIS — Z01419 Encounter for gynecological examination (general) (routine) without abnormal findings: Secondary | ICD-10-CM | POA: Diagnosis not present

## 2018-12-05 DIAGNOSIS — N951 Menopausal and female climacteric states: Secondary | ICD-10-CM | POA: Diagnosis not present

## 2018-12-05 DIAGNOSIS — Z6829 Body mass index (BMI) 29.0-29.9, adult: Secondary | ICD-10-CM | POA: Diagnosis not present

## 2018-12-05 DIAGNOSIS — N952 Postmenopausal atrophic vaginitis: Secondary | ICD-10-CM | POA: Diagnosis not present

## 2018-12-15 ENCOUNTER — Other Ambulatory Visit: Payer: Self-pay | Admitting: Pharmacist

## 2018-12-15 MED ORDER — ETANERCEPT 50 MG/ML ~~LOC~~ SOAJ: 50.0000 mg | SUBCUTANEOUS | 3 refills | Status: DC

## 2018-12-15 MED FILL — ENBREL SURECLICK 50 MG/ML S: 50 | 28 days supply | Qty: 4 | Fill #0

## 2018-12-15 NOTE — Telephone Encounter (Signed)
Stopping Orencia and going to try Enbrel again. Patient has been on Enbrel and counseled on the medication recently.

## 2018-12-25 MED FILL — HYDROXYCHLOROQUINE 200 MG T: 200 | 30 days supply | Qty: 60 | Fill #1

## 2019-01-09 ENCOUNTER — Other Ambulatory Visit: Payer: Self-pay | Admitting: Family Medicine

## 2019-01-09 ENCOUNTER — Encounter: Payer: Self-pay | Admitting: Family Medicine

## 2019-01-09 ENCOUNTER — Ambulatory Visit: Payer: 59 | Admitting: Family Medicine

## 2019-01-09 DIAGNOSIS — E785 Hyperlipidemia, unspecified: Secondary | ICD-10-CM

## 2019-01-09 DIAGNOSIS — G4700 Insomnia, unspecified: Secondary | ICD-10-CM

## 2019-01-09 DIAGNOSIS — M0579 Rheumatoid arthritis with rheumatoid factor of multiple sites without organ or systems involvement: Secondary | ICD-10-CM

## 2019-01-09 DIAGNOSIS — M5412 Radiculopathy, cervical region: Secondary | ICD-10-CM

## 2019-01-09 DIAGNOSIS — I10 Essential (primary) hypertension: Secondary | ICD-10-CM

## 2019-01-09 DIAGNOSIS — F419 Anxiety disorder, unspecified: Secondary | ICD-10-CM

## 2019-01-09 DIAGNOSIS — E119 Type 2 diabetes mellitus without complications: Secondary | ICD-10-CM

## 2019-01-09 MED ORDER — DULOXETINE HCL 30 MG PO CPEP: 60.0000 mg | ORAL_CAPSULE | Freq: Every day | ORAL | 3 refills | Status: DC

## 2019-01-09 MED ORDER — CYCLOBENZAPRINE HCL 10 MG PO TABS: 10.0000 mg | ORAL_TABLET | Freq: Three times a day (TID) | ORAL | 1 refills | Status: DC | PRN

## 2019-01-09 MED ORDER — POTASSIUM CHLORIDE CRYS ER 10 MEQ PO TBCR: EXTENDED_RELEASE_TABLET | ORAL | 1 refills | Status: DC

## 2019-01-09 MED ORDER — OLMESARTAN MEDOXOMIL 5 MG PO TABS: 5.0000 mg | ORAL_TABLET | Freq: Every day | ORAL | 3 refills | Status: DC

## 2019-01-09 MED ORDER — FUROSEMIDE 20 MG PO TABS: ORAL_TABLET | ORAL | 1 refills | Status: DC

## 2019-01-09 MED ORDER — ONETOUCH VERIO VI STRP: ORAL_STRIP | 12 refills | Status: DC

## 2019-01-09 MED ORDER — TEMAZEPAM 30 MG PO CAPS: 30.0000 mg | ORAL_CAPSULE | Freq: Every evening | ORAL | 3 refills | Status: DC | PRN

## 2019-01-09 MED ORDER — HYDROCODONE-HOMATROPINE 5-1.5 MG/5ML PO SYRP: 5.0000 mL | ORAL_SOLUTION | Freq: Four times a day (QID) | ORAL | 0 refills | Status: DC | PRN

## 2019-01-09 MED ORDER — GLUCOSE BLOOD VI STRP: ORAL_STRIP | 12 refills | Status: AC

## 2019-01-09 MED FILL — DULoxetine HCL 30 MG CPEP: 30 | 90 days supply | Qty: 180 | Fill #0

## 2019-01-09 MED FILL — ROSUVASTATIN CALCIUM 20 MG: 20 | 90 days supply | Qty: 90 | Fill #0

## 2019-01-09 MED FILL — DOTTI 0.1 MG/24HR PTTW: 0.1 | 84 days supply | Qty: 24 | Fill #0

## 2019-01-09 MED FILL — TEMAZEPAM 30 MG CAPSULE: 30 | 90 days supply | Qty: 90 | Fill #0

## 2019-01-09 MED FILL — OLMESARTAN MEDOXOMIL 5 MG T: 5 | 90 days supply | Qty: 90 | Fill #3

## 2019-01-09 MED FILL — CYCLOBENZAPRINE HCL 10 MG T: 10 | 10 days supply | Qty: 30 | Fill #0

## 2019-01-09 MED FILL — HYDROCODONE-HOMATROPINE SOL: 5-1.5 | 5 days supply | Qty: 100 | Fill #0

## 2019-01-09 MED FILL — METHOCARBAMOL 500 MG TABLET: 500 | 12 days supply | Qty: 45 | Fill #0

## 2019-01-09 MED FILL — POTASSIUM CHL ER M10 TABLET: 10 | 90 days supply | Qty: 90 | Fill #0

## 2019-01-09 MED FILL — FUROSEMIDE 20 MG TAB: 20 | 90 days supply | Qty: 90 | Fill #0

## 2019-01-09 NOTE — Telephone Encounter (Signed)
done

## 2019-01-10 ENCOUNTER — Encounter: Payer: Self-pay | Admitting: Family Medicine

## 2019-01-11 ENCOUNTER — Encounter: Payer: Self-pay | Admitting: Family Medicine

## 2019-01-11 ENCOUNTER — Other Ambulatory Visit: Payer: Self-pay | Admitting: Family Medicine

## 2019-01-11 NOTE — Telephone Encounter (Signed)
Letter printed.

## 2019-01-12 ENCOUNTER — Encounter: Payer: Self-pay | Admitting: Family Medicine

## 2019-01-16 ENCOUNTER — Telehealth: Payer: Self-pay | Admitting: Family Medicine

## 2019-01-16 ENCOUNTER — Encounter: Payer: 59 | Admitting: Family Medicine

## 2019-01-16 DIAGNOSIS — M130 Polyarthritis, unspecified: Secondary | ICD-10-CM | POA: Diagnosis not present

## 2019-01-16 DIAGNOSIS — M0609 Rheumatoid arthritis without rheumatoid factor, multiple sites: Secondary | ICD-10-CM | POA: Diagnosis not present

## 2019-01-16 DIAGNOSIS — M2550 Pain in unspecified joint: Secondary | ICD-10-CM | POA: Diagnosis not present

## 2019-01-16 DIAGNOSIS — Z79899 Other long term (current) drug therapy: Secondary | ICD-10-CM | POA: Diagnosis not present

## 2019-01-16 DIAGNOSIS — Z7189 Other specified counseling: Secondary | ICD-10-CM | POA: Diagnosis not present

## 2019-01-16 DIAGNOSIS — Z9884 Bariatric surgery status: Secondary | ICD-10-CM | POA: Diagnosis not present

## 2019-01-16 DIAGNOSIS — Z6828 Body mass index (BMI) 28.0-28.9, adult: Secondary | ICD-10-CM | POA: Diagnosis not present

## 2019-01-16 DIAGNOSIS — E663 Overweight: Secondary | ICD-10-CM | POA: Diagnosis not present

## 2019-01-16 DIAGNOSIS — E119 Type 2 diabetes mellitus without complications: Secondary | ICD-10-CM | POA: Diagnosis not present

## 2019-01-16 MED FILL — BD TB SYRINGE 27GX1/2: 27G X 1/2" | 90 days supply | Qty: 13 | Fill #0

## 2019-01-16 MED FILL — METHOTREXATE 25 MG/ML VIAL: 50 | 42 days supply | Qty: 4 | Fill #0

## 2019-01-16 MED FILL — FOLIC ACID 1 MG TABS: 1 | 30 days supply | Qty: 30 | Fill #0

## 2019-01-16 MED FILL — BD TB SYRINGE 27GX1/2": 27G X 1/2" | 90 days supply | Qty: 13 | Fill #0

## 2019-01-16 NOTE — Telephone Encounter (Signed)
Pt came in office dropped off document to be filled out by provider (FMLA 4 pages) Pt would like document to be faxed to [phone] when ready. Document put at front office tray under providers name.

## 2019-01-16 NOTE — Telephone Encounter (Signed)
FMLA paperwork received. On my desk and will be filled out when [place] returns.

## 2019-01-22 ENCOUNTER — Telehealth: Payer: Self-pay

## 2019-01-22 ENCOUNTER — Encounter: Payer: Self-pay | Admitting: Family Medicine

## 2019-01-22 DIAGNOSIS — Z0279 Encounter for issue of other medical certificate: Secondary | ICD-10-CM

## 2019-01-22 NOTE — Telephone Encounter (Signed)
FMLA paperwork faxed today and received confirmation. Copies made. One sent to scan, one sent to billing, and the original on my desk.

## 2019-01-23 ENCOUNTER — Encounter: Payer: Self-pay | Admitting: Family Medicine

## 2019-01-23 MED FILL — ENBREL SURECLICK 50 MG/ML S: 50 | 28 days supply | Qty: 4 | Fill #1

## 2019-01-26 ENCOUNTER — Ambulatory Visit (INDEPENDENT_AMBULATORY_CARE_PROVIDER_SITE_OTHER): Payer: 59 | Admitting: Family Medicine

## 2019-01-26 ENCOUNTER — Encounter: Payer: Self-pay | Admitting: Family Medicine

## 2019-01-26 ENCOUNTER — Other Ambulatory Visit: Payer: Self-pay

## 2019-01-26 VITALS — BP 131/73 | HR 74 | Temp 98.9°F | Resp 18 | Ht 71.0 in | Wt 198.4 lb

## 2019-01-26 DIAGNOSIS — E785 Hyperlipidemia, unspecified: Secondary | ICD-10-CM | POA: Diagnosis not present

## 2019-01-26 DIAGNOSIS — E1165 Type 2 diabetes mellitus with hyperglycemia: Secondary | ICD-10-CM

## 2019-01-26 DIAGNOSIS — Z0000 Encounter for general adult medical examination without abnormal findings: Secondary | ICD-10-CM

## 2019-01-26 DIAGNOSIS — F419 Anxiety disorder, unspecified: Secondary | ICD-10-CM | POA: Diagnosis not present

## 2019-01-26 DIAGNOSIS — I10 Essential (primary) hypertension: Secondary | ICD-10-CM

## 2019-01-26 DIAGNOSIS — Z0001 Encounter for general adult medical examination with abnormal findings: Secondary | ICD-10-CM

## 2019-01-26 MED ORDER — ALPRAZOLAM 0.25 MG PO TABS: 0.2500 mg | ORAL_TABLET | Freq: Two times a day (BID) | ORAL | 0 refills | Status: DC | PRN

## 2019-01-26 MED FILL — ALPRAZolam 0.25 MG TABS: 0.25 | 10 days supply | Qty: 20 | Fill #0
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
## 2019-01-26 NOTE — Patient Instructions (Signed)

## 2019-01-27 LAB — HEMOGLOBIN A1C
Hgb A1c MFr Bld: 6.3 % of total Hgb — ABNORMAL HIGH (ref <5.7)
Mean Plasma Glucose: 134 (calc)
eAG (mmol/L): 7.4 (calc)

## 2019-01-27 LAB — LIPID PANEL
Cholesterol: 117 mg/dL (ref <200)
HDL: 63 mg/dL (ref >=50)
LDL Cholesterol (Calc): 40 mg/dL (calc)
Non-HDL Cholesterol (Calc): 54 mg/dL (calc) (ref <130)
Total CHOL/HDL Ratio: 1.9 (calc) (ref <5.0)
Triglycerides: 65 mg/dL (ref <150)

## 2019-01-27 LAB — TSH: TSH: 1.78 mIU/L (ref 0.40–4.50)

## 2019-01-29 ENCOUNTER — Encounter: Payer: Self-pay | Admitting: Family Medicine

## 2019-01-29 ENCOUNTER — Other Ambulatory Visit: Payer: Self-pay | Admitting: Family Medicine

## 2019-01-29 DIAGNOSIS — B029 Zoster without complications: Secondary | ICD-10-CM

## 2019-01-29 MED ORDER — VALACYCLOVIR HCL 1 G PO TABS: 1000.0000 mg | ORAL_TABLET | Freq: Three times a day (TID) | ORAL | 0 refills | Status: DC

## 2019-01-29 MED FILL — valACYclovir HCL 1 GM TABS: 1 | 10 days supply | Qty: 30 | Fill #0

## 2019-02-01 ENCOUNTER — Encounter: Payer: Self-pay | Admitting: Family Medicine

## 2019-02-04 ENCOUNTER — Encounter: Payer: Self-pay | Admitting: Family Medicine

## 2019-02-06 ENCOUNTER — Encounter: Payer: Self-pay | Admitting: Family Medicine

## 2019-02-07 ENCOUNTER — Telehealth: Payer: Self-pay | Admitting: Family Medicine

## 2019-02-07 NOTE — Telephone Encounter (Signed)
Pt came in office and dropped off document to be filled out by provider ( [place] pages) Pt would like document to be faxed to [phone] when ready. Document put at front office tray under providers name.

## 2019-02-07 NOTE — Telephone Encounter (Signed)
Anderson Malta with [employer] calling to confirm fax sent on 01/31/19 93 pages) was received?  She wants to make sure it's the same pages the patient dropped off. Please advise.

## 2019-02-07 NOTE — Telephone Encounter (Signed)
Pt came in office and stated that provider can actually fill out the [employer] paperwork like that last one the was filled out for her. Please advise. Pt mentioned if any question to please call her at [phone].

## 2019-02-08 ENCOUNTER — Encounter: Payer: Self-pay | Admitting: Family Medicine

## 2019-02-08 ENCOUNTER — Telehealth: Payer: Self-pay | Admitting: *Deleted

## 2019-02-08 DIAGNOSIS — F4310 Post-traumatic stress disorder, unspecified: Secondary | ICD-10-CM | POA: Insufficient documentation

## 2019-02-08 NOTE — Telephone Encounter (Signed)
Paperwork is currently being worked on by [employer] at this time.  Copied from [place] [phone]. Topic: General - Other >> Feb 06, 2019 11:54 AM Marin Olp L wrote: Reason for CRM: Patient calling to find out if [employer] short term disability forms were received via fax? See mychart messages with [employer].  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

## 2019-02-08 NOTE — Telephone Encounter (Signed)
done

## 2019-02-09 ENCOUNTER — Ambulatory Visit: Payer: 59 | Admitting: Family Medicine

## 2019-02-09 ENCOUNTER — Encounter: Payer: Self-pay | Admitting: Family Medicine

## 2019-02-09 ENCOUNTER — Telehealth: Payer: Self-pay

## 2019-02-09 NOTE — Telephone Encounter (Signed)
Updated FMLA forms faxed and faxed to medical records. Received confirmation.

## 2019-02-12 MED FILL — predniSONE 5 MG/5ML SOLN: 5 | 7 days supply | Qty: 140 | Fill #0

## 2019-02-13 DIAGNOSIS — M0609 Rheumatoid arthritis without rheumatoid factor, multiple sites: Secondary | ICD-10-CM | POA: Diagnosis not present

## 2019-02-13 MED FILL — ENBREL SURECLICK 50 MG/ML S: 50 | 28 days supply | Qty: 4 | Fill #2

## 2019-02-14 ENCOUNTER — Telehealth: Payer: Self-pay | Admitting: *Deleted

## 2019-02-14 NOTE — Telephone Encounter (Signed)
Copied from [place] [phone]. Topic: Appointment Scheduling - Scheduling Inquiry for Clinic >> Feb 14, 2019 11:07 AM Scherrie Gerlach wrote: Reason for CRM: pt would like to return to work on 8/19 and needs an OV with Dr Etter Sjogren on 08/17 or week before. Please call and schedule.  No answer at the office

## 2019-02-16 MED FILL — FOLIC ACID 1 MG TABS: 1 | 30 days supply | Qty: 30 | Fill #1

## 2019-02-19 MED FILL — METHOTREXATE 25 MG/ML VIAL: 50 | 30 days supply | Qty: 4 | Fill #0

## 2019-02-23 ENCOUNTER — Encounter: Payer: Self-pay | Admitting: Family Medicine

## 2019-02-23 ENCOUNTER — Ambulatory Visit (INDEPENDENT_AMBULATORY_CARE_PROVIDER_SITE_OTHER): Payer: 59 | Admitting: Family Medicine

## 2019-02-23 DIAGNOSIS — F4310 Post-traumatic stress disorder, unspecified: Secondary | ICD-10-CM | POA: Diagnosis not present

## 2019-02-23 NOTE — Assessment & Plan Note (Signed)
con't with EAP con't meds rto prn  Pt to go back to work 02/28/19
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
## 2019-02-26 ENCOUNTER — Ambulatory Visit: Payer: 59 | Admitting: Family Medicine

## 2019-03-01 ENCOUNTER — Other Ambulatory Visit: Payer: Self-pay | Admitting: Pharmacist

## 2019-03-01 DIAGNOSIS — Z79899 Other long term (current) drug therapy: Secondary | ICD-10-CM | POA: Diagnosis not present

## 2019-03-01 DIAGNOSIS — Z9884 Bariatric surgery status: Secondary | ICD-10-CM | POA: Diagnosis not present

## 2019-03-01 DIAGNOSIS — M2550 Pain in unspecified joint: Secondary | ICD-10-CM | POA: Diagnosis not present

## 2019-03-01 DIAGNOSIS — M130 Polyarthritis, unspecified: Secondary | ICD-10-CM | POA: Diagnosis not present

## 2019-03-01 DIAGNOSIS — E119 Type 2 diabetes mellitus without complications: Secondary | ICD-10-CM | POA: Diagnosis not present

## 2019-03-01 DIAGNOSIS — Z7189 Other specified counseling: Secondary | ICD-10-CM | POA: Diagnosis not present

## 2019-03-01 DIAGNOSIS — L03119 Cellulitis of unspecified part of limb: Secondary | ICD-10-CM | POA: Diagnosis not present

## 2019-03-01 DIAGNOSIS — M0609 Rheumatoid arthritis without rheumatoid factor, multiple sites: Secondary | ICD-10-CM | POA: Diagnosis not present

## 2019-03-01 DIAGNOSIS — L02619 Cutaneous abscess of unspecified foot: Secondary | ICD-10-CM | POA: Diagnosis not present

## 2019-03-01 MED ORDER — ORENCIA CLICKJECT 125 MG/ML ~~LOC~~ SOAJ: 1.0000 mL | SUBCUTANEOUS | 5 refills | Status: DC

## 2019-03-01 MED FILL — DOXYCYCLINE HYCLATE 100 MG: 100 | 7 days supply | Qty: 14 | Fill #0

## 2019-03-02 ENCOUNTER — Encounter: Payer: Self-pay | Admitting: Family Medicine

## 2019-03-07 NOTE — Telephone Encounter (Signed)
Samella Parr from [place] advised Pt that she has not received any paperwork from PCP and if she doesn't receive it dated 8.3.20-8.18.20 it will be denied.  Please call Estill Bamberg at [phone] ext [phone]   Claim# [id number] for dates of 02/12/19 -02/27/19  Fax# [phone]

## 2019-03-07 NOTE — Telephone Encounter (Signed)
Faxed forms to the fax number provided. Received confirmation.

## 2019-03-11 ENCOUNTER — Encounter: Payer: Self-pay | Admitting: Family Medicine

## 2019-03-12 NOTE — Telephone Encounter (Signed)
Hard for me to answer unless I see it----  Can we do a virtual or can she come in?

## 2019-03-13 MED FILL — ORENCIA CLICKJECT 125 MG/ML: 125 | 28 days supply | Qty: 4 | Fill #0

## 2019-03-13 MED FILL — CYCLOBENZAPRINE HCL 10 MG T: 10 | 10 days supply | Qty: 30 | Fill #1

## 2019-03-14 ENCOUNTER — Encounter: Payer: Self-pay | Admitting: Family Medicine

## 2019-03-14 DIAGNOSIS — H524 Presbyopia: Secondary | ICD-10-CM | POA: Diagnosis not present

## 2019-03-14 DIAGNOSIS — H5203 Hypermetropia, bilateral: Secondary | ICD-10-CM | POA: Diagnosis not present

## 2019-03-16 ENCOUNTER — Encounter: Payer: Self-pay | Admitting: Family Medicine

## 2019-03-16 MED ORDER — CEPHALEXIN 500 MG PO CAPS: 500.0000 mg | ORAL_CAPSULE | Freq: Four times a day (QID) | ORAL | 0 refills | Status: DC

## 2019-03-16 MED FILL — ORENCIA CLICKJECT 125 MG/ML: 125 | 28 days supply | Qty: 4 | Fill #0

## 2019-03-16 MED FILL — CEPHALEXIN 500 MG CAPSULE: 500 | 7 days supply | Qty: 28 | Fill #0

## 2019-03-16 NOTE — Telephone Encounter (Signed)
Keflex 500 qid x 7 days --- if no better Monday do virtual visit

## 2019-03-21 MED FILL — FOLIC ACID 1 MG TABS: 1 | 30 days supply | Qty: 30 | Fill #2

## 2019-03-30 MED FILL — HYDROXYCHLOROQUINE 200 MG T: 200 | 30 days supply | Qty: 60 | Fill #2

## 2019-03-30 MED FILL — METHOTREXATE 25 MG/ML VIAL: 50 | 30 days supply | Qty: 4 | Fill #1

## 2019-04-05 MED FILL — TEMAZEPAM 30 MG CAPSULE: 30 | 90 days supply | Qty: 90 | Fill #1

## 2019-04-05 MED FILL — POTASSIUM CHL ER M10 TABLET: 10 | 90 days supply | Qty: 90 | Fill #1

## 2019-04-05 MED FILL — FUROSEMIDE 20 MG TAB: 20 | 90 days supply | Qty: 90 | Fill #1

## 2019-04-05 MED FILL — METHOCARBAMOL 500 MG TABS: 500 | 12 days supply | Qty: 45 | Fill #1

## 2019-04-05 MED FILL — DULoxetine HCL 30 MG CPEP: 30 | 90 days supply | Qty: 180 | Fill #1

## 2019-04-05 MED FILL — DOTTI 0.1 MG/24HR PTTW: 0.1 | 84 days supply | Qty: 24 | Fill #1

## 2019-04-05 MED FILL — OLMESARTAN MEDOXOMIL 5 MG T: 5 | 90 days supply | Qty: 90 | Fill #0

## 2019-04-05 MED FILL — ROSUVASTATIN CALCIUM 20 MG: 20 | 90 days supply | Qty: 90 | Fill #1

## 2019-04-08 ENCOUNTER — Encounter: Payer: Self-pay | Admitting: Family Medicine

## 2019-04-14 ENCOUNTER — Encounter: Payer: Self-pay | Admitting: Emergency Medicine

## 2019-04-14 ENCOUNTER — Emergency Department
Admission: EM | Admit: 2019-04-14 | Discharge: 2019-04-14 | Disposition: A | Discharge status: Home / Self Care | Payer: 59 | Source: Home / Self Care

## 2019-04-14 ENCOUNTER — Other Ambulatory Visit: Payer: Self-pay

## 2019-04-14 DIAGNOSIS — S61452A Open bite of left hand, initial encounter: Secondary | ICD-10-CM | POA: Diagnosis not present

## 2019-04-14 DIAGNOSIS — W540XXA Bitten by dog, initial encounter: Secondary | ICD-10-CM

## 2019-04-14 DIAGNOSIS — S61412A Laceration without foreign body of left hand, initial encounter: Secondary | ICD-10-CM

## 2019-04-14 MED ORDER — AMOXICILLIN-POT CLAVULANATE 875-125 MG PO TABS: 1.0000 | ORAL_TABLET | Freq: Two times a day (BID) | ORAL | 0 refills | Status: DC

## 2019-04-14 NOTE — ED Triage Notes (Signed)
Patient has a laceration on her left hand from possible her dogs paws or teeth.  Patient does have RA and easy to get infections.  Tdap is current.

## 2019-04-14 NOTE — Discharge Instructions (Signed)
°  Please take antibiotics as prescribed and be sure to complete entire course even if you start to feel better to ensure infection does not come back.  Please follow up later this week if not improving.
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
## 2019-04-18 MED FILL — FOLIC ACID 1 MG TABS: 1 | 30 days supply | Qty: 30 | Fill #0

## 2019-04-19 MED FILL — BD TB SYRINGE 27GX1/2: 27G X 1/2" | 90 days supply | Qty: 13 | Fill #1

## 2019-04-19 MED FILL — BD TB SYRINGE 27GX1/2": 27G X 1/2" | 90 days supply | Qty: 13 | Fill #1

## 2019-05-01 DIAGNOSIS — M2550 Pain in unspecified joint: Secondary | ICD-10-CM | POA: Diagnosis not present

## 2019-05-01 DIAGNOSIS — E663 Overweight: Secondary | ICD-10-CM | POA: Diagnosis not present

## 2019-05-01 DIAGNOSIS — Z7189 Other specified counseling: Secondary | ICD-10-CM | POA: Diagnosis not present

## 2019-05-01 DIAGNOSIS — M0609 Rheumatoid arthritis without rheumatoid factor, multiple sites: Secondary | ICD-10-CM | POA: Diagnosis not present

## 2019-05-01 DIAGNOSIS — M130 Polyarthritis, unspecified: Secondary | ICD-10-CM | POA: Diagnosis not present

## 2019-05-01 DIAGNOSIS — E119 Type 2 diabetes mellitus without complications: Secondary | ICD-10-CM | POA: Diagnosis not present

## 2019-05-01 DIAGNOSIS — Z79899 Other long term (current) drug therapy: Secondary | ICD-10-CM | POA: Diagnosis not present

## 2019-05-01 DIAGNOSIS — Z9884 Bariatric surgery status: Secondary | ICD-10-CM | POA: Diagnosis not present

## 2019-05-01 DIAGNOSIS — L02619 Cutaneous abscess of unspecified foot: Secondary | ICD-10-CM | POA: Diagnosis not present

## 2019-05-01 MED FILL — METHOTREXATE 25 MG/ML VIAL: 50 | 30 days supply | Qty: 4 | Fill #0

## 2019-05-01 MED FILL — ORENCIA CLICKJECT 125 MG/ML: 125 | 28 days supply | Qty: 4 | Fill #1

## 2019-05-01 MED FILL — BD TB SYRINGE 27GX1/2": 27G X 1/2" | 90 days supply | Qty: 13 | Fill #0

## 2019-05-01 MED FILL — BD TB SYRINGE 27GX1/2: 27G X 1/2" | 90 days supply | Qty: 13 | Fill #0

## 2019-05-01 MED FILL — FOLIC ACID 1 MG TABS: 1 | 90 days supply | Qty: 270 | Fill #0

## 2019-05-01 MED FILL — CYCLOBENZAPRINE HCL 10 MG T: 10 | 30 days supply | Qty: 30 | Fill #0

## 2019-05-28 MED FILL — HYDROXYCHLOROQUINE 200 MG T: 200 | 30 days supply | Qty: 60 | Fill #0

## 2019-05-29 MED FILL — ORENCIA CLICKJECT 125 MG/ML: 125 | 28 days supply | Qty: 4 | Fill #2

## 2019-05-30 ENCOUNTER — Encounter: Payer: Self-pay | Admitting: Family Medicine

## 2019-05-31 ENCOUNTER — Ambulatory Visit (INDEPENDENT_AMBULATORY_CARE_PROVIDER_SITE_OTHER): Payer: 59 | Admitting: Family Medicine

## 2019-05-31 ENCOUNTER — Other Ambulatory Visit: Payer: Self-pay

## 2019-05-31 ENCOUNTER — Encounter: Payer: Self-pay | Admitting: Family Medicine

## 2019-05-31 VITALS — Temp 100.2°F | Ht 71.0 in | Wt 195.0 lb

## 2019-05-31 DIAGNOSIS — J0140 Acute pansinusitis, unspecified: Secondary | ICD-10-CM

## 2019-05-31 DIAGNOSIS — R05 Cough: Secondary | ICD-10-CM | POA: Diagnosis not present

## 2019-05-31 DIAGNOSIS — R059 Cough, unspecified: Secondary | ICD-10-CM

## 2019-05-31 MED ORDER — FLUTICASONE PROPIONATE 50 MCG/ACT NA SUSP: 2.0000 | Freq: Every day | NASAL | 6 refills | Status: DC

## 2019-05-31 MED ORDER — CEFDINIR 300 MG PO CAPS: 300.0000 mg | ORAL_CAPSULE | Freq: Two times a day (BID) | ORAL | 0 refills | Status: DC

## 2019-05-31 MED ORDER — PROMETHAZINE-CODEINE 6.25-10 MG/5ML PO SYRP: 5.0000 mL | ORAL_SOLUTION | Freq: Three times a day (TID) | ORAL | 0 refills | Status: DC | PRN

## 2019-05-31 MED FILL — PROMETHAZINE W/COD SYRUP: 6.25-10 | 8 days supply | Qty: 120 | Fill #0

## 2019-05-31 MED FILL — CEFDINIR 300 MG CAPSULE: 300 | 10 days supply | Qty: 20 | Fill #0

## 2019-05-31 MED FILL — FLUTICASONE PROP 50 MCG SPR: 50 | 30 days supply | Qty: 16 | Fill #0

## 2019-05-31 NOTE — Progress Notes (Signed)
Virtual Visit via Video Note  I connected with [name] on 05/31/19 at  8:20 AM EST by a video enabled telemedicine application and verified that I am speaking with the correct person using two identifiers. offi Location: Patient: home alone Provider: office    I discussed the limitations of evaluation and management by telemedicine and the availability of in person appointments. The patient expressed understanding and agreed to proceed.  History of Present Illness: Pt is home c/o runny nose, ear pain , headache and scratchy throat since Friday with fever  100.4     covid test done at work and was negative  + cough, dry   + nasal congestion  Pt is taking tylenol , sucrets for throat     Observations/Objective: Vitals:   05/31/19 0819  Temp: 100.2 F (37.9 C)    pt is in NAD  Assessment and Plan: 1. Acute non-recurrent pansinusitis covid neg but pt still having fevers  - cefdinir (OMNICEF) 300 MG capsule; Take 1 capsule (300 mg total) by mouth 2 (two) times daily.  Dispense: 20 capsule; Refill: 0 - fluticasone (FLONASE) 50 MCG/ACT nasal spray; Place 2 sprays into both nostrils daily.  Dispense: 16 g; Refill: 6  2. Cough  - promethazine-codeine (PHENERGAN WITH CODEINE) 6.25-10 MG/5ML syrup; Take 5 mLs by mouth every 8 (eight) hours as needed for cough.  Dispense: 120 mL; Refill: 0   Follow Up Instructions:    I discussed the assessment and treatment plan with the patient. The patient was provided an opportunity to ask questions and all were answered. The patient agreed with the plan and demonstrated an understanding of the instructions.   The patient was advised to call back or seek an in-person evaluation if the symptoms worsen or if the condition fails to improve as anticipated.  I provided 15 minutes of non-face-to-face time during this encounter.   [name], DO

## 2019-06-11 MED FILL — CYCLOBENZAPRINE HCL 10 MG T: 10 | 30 days supply | Qty: 30 | Fill #1

## 2019-06-12 MED FILL — METHOTREXATE 25 MG/ML VIAL: 50 | 30 days supply | Qty: 4 | Fill #1

## 2019-06-28 DIAGNOSIS — M130 Polyarthritis, unspecified: Secondary | ICD-10-CM | POA: Diagnosis not present

## 2019-06-28 DIAGNOSIS — M2550 Pain in unspecified joint: Secondary | ICD-10-CM | POA: Diagnosis not present

## 2019-06-28 DIAGNOSIS — Z7189 Other specified counseling: Secondary | ICD-10-CM | POA: Diagnosis not present

## 2019-06-28 DIAGNOSIS — Z9884 Bariatric surgery status: Secondary | ICD-10-CM | POA: Diagnosis not present

## 2019-06-28 DIAGNOSIS — E663 Overweight: Secondary | ICD-10-CM | POA: Diagnosis not present

## 2019-06-28 DIAGNOSIS — M0609 Rheumatoid arthritis without rheumatoid factor, multiple sites: Secondary | ICD-10-CM | POA: Diagnosis not present

## 2019-06-28 DIAGNOSIS — Z79899 Other long term (current) drug therapy: Secondary | ICD-10-CM | POA: Diagnosis not present

## 2019-06-28 DIAGNOSIS — E119 Type 2 diabetes mellitus without complications: Secondary | ICD-10-CM | POA: Diagnosis not present

## 2019-06-28 DIAGNOSIS — Z6827 Body mass index (BMI) 27.0-27.9, adult: Secondary | ICD-10-CM | POA: Diagnosis not present

## 2019-07-03 ENCOUNTER — Other Ambulatory Visit: Payer: Self-pay | Admitting: Family Medicine

## 2019-07-03 DIAGNOSIS — E785 Hyperlipidemia, unspecified: Secondary | ICD-10-CM

## 2019-07-03 MED FILL — TEMAZEPAM 30 MG CAPSULE: 30 | 90 days supply | Qty: 90 | Fill #2

## 2019-07-03 MED FILL — FUROSEMIDE 20 MG TAB: 20 | 90 days supply | Qty: 90 | Fill #1

## 2019-07-03 MED FILL — OLMESARTAN MEDOXOMIL 5 MG T: 5 | 90 days supply | Qty: 90 | Fill #1

## 2019-07-03 MED FILL — ORENCIA CLICKJECT 125 MG/ML: 125 | 28 days supply | Qty: 4 | Fill #3

## 2019-07-03 MED FILL — DULoxetine HCL 30 MG CPEP: 30 | 90 days supply | Qty: 180 | Fill #2

## 2019-07-04 MED FILL — ROSUVASTATIN CALCIUM 20 MG: 20 | 90 days supply | Qty: 90 | Fill #0

## 2019-07-05 MED FILL — CYCLOBENZAPRINE HCL 10 MG T: 10 | 30 days supply | Qty: 30 | Fill #0

## 2019-07-09 MED FILL — METHOTREXATE 25 MG/ML VIAL: 50 | 30 days supply | Qty: 4 | Fill #2

## 2019-07-10 MED FILL — HYDROXYCHLOROQUINE 200 MG T: 200 | 30 days supply | Qty: 60 | Fill #1

## 2019-07-12 ENCOUNTER — Other Ambulatory Visit (HOSPITAL_BASED_OUTPATIENT_CLINIC_OR_DEPARTMENT_OTHER): Payer: Self-pay | Admitting: Family Medicine

## 2019-07-12 ENCOUNTER — Encounter: Payer: Self-pay | Admitting: Family Medicine

## 2019-07-12 ENCOUNTER — Other Ambulatory Visit: Payer: Self-pay | Admitting: Family Medicine

## 2019-07-12 DIAGNOSIS — Z1231 Encounter for screening mammogram for malignant neoplasm of breast: Secondary | ICD-10-CM

## 2019-07-12 DIAGNOSIS — M0579 Rheumatoid arthritis with rheumatoid factor of multiple sites without organ or systems involvement: Secondary | ICD-10-CM

## 2019-07-12 MED ORDER — HYDROCODONE-ACETAMINOPHEN 5-325 MG PO TABS: 1.0000 | ORAL_TABLET | Freq: Four times a day (QID) | ORAL | 0 refills | Status: DC | PRN

## 2019-07-18 ENCOUNTER — Other Ambulatory Visit: Payer: Self-pay

## 2019-07-18 ENCOUNTER — Ambulatory Visit (HOSPITAL_BASED_OUTPATIENT_CLINIC_OR_DEPARTMENT_OTHER)
Admission: RE | Admit: 2019-07-18 | Discharge: 2019-07-18 | Disposition: A | Discharge status: Home / Self Care | Payer: 59 | Source: Ambulatory Visit | Attending: Family Medicine | Admitting: Family Medicine

## 2019-07-18 DIAGNOSIS — Z1231 Encounter for screening mammogram for malignant neoplasm of breast: Secondary | ICD-10-CM | POA: Diagnosis not present

## 2019-07-18 DIAGNOSIS — H2513 Age-related nuclear cataract, bilateral: Secondary | ICD-10-CM | POA: Diagnosis not present

## 2019-07-18 DIAGNOSIS — R519 Headache, unspecified: Secondary | ICD-10-CM | POA: Diagnosis not present

## 2019-07-18 DIAGNOSIS — E119 Type 2 diabetes mellitus without complications: Secondary | ICD-10-CM | POA: Diagnosis not present

## 2019-07-18 DIAGNOSIS — H04123 Dry eye syndrome of bilateral lacrimal glands: Secondary | ICD-10-CM | POA: Diagnosis not present

## 2019-07-18 DIAGNOSIS — Z79899 Other long term (current) drug therapy: Secondary | ICD-10-CM | POA: Diagnosis not present

## 2019-07-22 DIAGNOSIS — M1711 Unilateral primary osteoarthritis, right knee: Secondary | ICD-10-CM | POA: Diagnosis not present

## 2019-07-26 MED FILL — PREMARIN VAGINAL CREAM-APPL: 0.625 | 30 days supply | Qty: 30 | Fill #0

## 2019-07-26 MED FILL — FOLIC ACID 1 MG TABS: 1 | 90 days supply | Qty: 270 | Fill #1

## 2019-07-28 DIAGNOSIS — M25561 Pain in right knee: Secondary | ICD-10-CM | POA: Diagnosis not present

## 2019-07-30 MED FILL — METHOCARBAMOL 500 MG TABS: 500 | 20 days supply | Qty: 60 | Fill #0

## 2019-07-30 MED FILL — HYDROCODON-APAP 5-325: 5-325 | 16 days supply | Qty: 16 | Fill #0

## 2019-08-01 ENCOUNTER — Other Ambulatory Visit: Payer: Self-pay

## 2019-08-02 ENCOUNTER — Encounter: Payer: Self-pay | Admitting: Family Medicine

## 2019-08-02 ENCOUNTER — Ambulatory Visit: Payer: 59 | Admitting: Family Medicine

## 2019-08-02 ENCOUNTER — Other Ambulatory Visit: Payer: Self-pay

## 2019-08-02 VITALS — BP 130/68 | HR 68 | Temp 97.6°F | Resp 18 | Ht 71.0 in | Wt 194.0 lb

## 2019-08-02 DIAGNOSIS — F419 Anxiety disorder, unspecified: Secondary | ICD-10-CM

## 2019-08-02 DIAGNOSIS — M0579 Rheumatoid arthritis with rheumatoid factor of multiple sites without organ or systems involvement: Secondary | ICD-10-CM | POA: Diagnosis not present

## 2019-08-02 DIAGNOSIS — E1169 Type 2 diabetes mellitus with other specified complication: Secondary | ICD-10-CM

## 2019-08-02 DIAGNOSIS — M5412 Radiculopathy, cervical region: Secondary | ICD-10-CM

## 2019-08-02 DIAGNOSIS — E118 Type 2 diabetes mellitus with unspecified complications: Secondary | ICD-10-CM | POA: Diagnosis not present

## 2019-08-02 DIAGNOSIS — M170 Bilateral primary osteoarthritis of knee: Secondary | ICD-10-CM

## 2019-08-02 DIAGNOSIS — E785 Hyperlipidemia, unspecified: Secondary | ICD-10-CM

## 2019-08-02 DIAGNOSIS — E559 Vitamin D deficiency, unspecified: Secondary | ICD-10-CM | POA: Diagnosis not present

## 2019-08-02 DIAGNOSIS — I10 Essential (primary) hypertension: Secondary | ICD-10-CM

## 2019-08-02 LAB — LIPID PANEL
Cholesterol: 117 mg/dL (ref 0–200)
HDL: 71.6 mg/dL (ref >39.00)
LDL Cholesterol: 35 mg/dL (ref 0–99)
NonHDL: 45.35
Total CHOL/HDL Ratio: 2
Triglycerides: 50 mg/dL (ref 0.0–149.0)
VLDL: 10 mg/dL (ref 0.0–40.0)

## 2019-08-02 LAB — HEMOGLOBIN A1C: Hgb A1c MFr Bld: 6.3 % (ref 4.6–6.5)

## 2019-08-02 LAB — COMPREHENSIVE METABOLIC PANEL
ALT: 37 U/L — ABNORMAL HIGH (ref 0–35)
AST: 24 U/L (ref 0–37)
Albumin: 4.2 g/dL (ref 3.5–5.2)
Alkaline Phosphatase: 80 U/L (ref 39–117)
BUN: 9 mg/dL (ref 6–23)
CO2: 31 mEq/L (ref 19–32)
Calcium: 9.3 mg/dL (ref 8.4–10.5)
Chloride: 104 mEq/L (ref 96–112)
Creatinine, Ser: 0.72 mg/dL (ref 0.40–1.20)
GFR: 81.92 mL/min (ref >60.00)
Glucose, Bld: 97 mg/dL (ref 70–99)
Potassium: 4.1 mEq/L (ref 3.5–5.1)
Sodium: 139 mEq/L (ref 135–145)
Total Bilirubin: 0.5 mg/dL (ref 0.2–1.2)
Total Protein: 7 g/dL (ref 6.0–8.3)

## 2019-08-02 LAB — MICROALBUMIN / CREATININE URINE RATIO
Creatinine,U: 128.4 mg/dL
Microalb Creat Ratio: 0.6 mg/g (ref 0.0–30.0)
Microalb, Ur: 0.8 mg/dL (ref 0.0–1.9)

## 2019-08-02 MED ORDER — ROSUVASTATIN CALCIUM 20 MG PO TABS: ORAL_TABLET | ORAL | 2 refills | Status: DC

## 2019-08-02 MED ORDER — DULOXETINE HCL 30 MG PO CPEP: 60.0000 mg | ORAL_CAPSULE | Freq: Every day | ORAL | 3 refills | Status: DC

## 2019-08-02 MED ORDER — METHOCARBAMOL 500 MG PO TABS: 500.0000 mg | ORAL_TABLET | Freq: Four times a day (QID) | ORAL | 1 refills | Status: DC

## 2019-08-02 MED ORDER — HYDROCODONE-ACETAMINOPHEN 5-325 MG PO TABS: 1.0000 | ORAL_TABLET | Freq: Four times a day (QID) | ORAL | 0 refills | Status: AC | PRN

## 2019-08-02 MED ORDER — POTASSIUM CHLORIDE CRYS ER 10 MEQ PO TBCR: EXTENDED_RELEASE_TABLET | ORAL | 1 refills | Status: DC

## 2019-08-02 MED ORDER — ALPRAZOLAM 0.25 MG PO TABS: 0.2500 mg | ORAL_TABLET | Freq: Two times a day (BID) | ORAL | 0 refills | Status: DC | PRN

## 2019-08-02 MED ORDER — FUROSEMIDE 20 MG PO TABS: ORAL_TABLET | ORAL | 1 refills | Status: DC

## 2019-08-02 MED ORDER — OLMESARTAN MEDOXOMIL 5 MG PO TABS: 5.0000 mg | ORAL_TABLET | Freq: Every day | ORAL | 3 refills | Status: DC

## 2019-08-02 MED FILL — ORENCIA CLICKJECT 125 MG/ML: 125 | 28 days supply | Qty: 4 | Fill #0

## 2019-08-02 MED FILL — ALPRAZolam 0.25 MG TABS: 0.25 | 10 days supply | Qty: 20 | Fill #0

## 2019-08-02 MED FILL — POTASSIUM CHL ER M10 TABLET: 10 | 90 days supply | Qty: 90 | Fill #0

## 2019-08-02 NOTE — Assessment & Plan Note (Signed)
Encouraged heart healthy diet, increase exercise, avoid trans fats, consider a krill oil cap daily 

## 2019-08-02 NOTE — Patient Instructions (Signed)
Carbohydrate Counting for Diabetes Mellitus, Adult  Carbohydrate counting is a method of keeping track of how many carbohydrates you eat. Eating carbohydrates naturally increases the amount of sugar (glucose) in the blood. Counting how many carbohydrates you eat helps keep your blood glucose within normal limits, which helps you manage your diabetes (diabetes mellitus). It is important to know how many carbohydrates you can safely have in each meal. This is different for every person. A diet and nutrition specialist (registered dietitian) can help you make a meal plan and calculate how many carbohydrates you should have at each meal and snack. Carbohydrates are found in the following foods:  Grains, such as breads and cereals.  Dried beans and soy products.  Starchy vegetables, such as potatoes, peas, and corn.  Fruit and fruit juices.  Milk and yogurt.  Sweets and snack foods, such as cake, cookies, candy, chips, and soft drinks. How do I count carbohydrates? There are two ways to count carbohydrates in food. You can use either of the methods or a combination of both. Reading "Nutrition Facts" on packaged food The "Nutrition Facts" list is included on the labels of almost all packaged foods and beverages in the [place] It includes:  The serving size.  Information about nutrients in each serving, including the grams (g) of carbohydrate per serving. To use the "Nutrition Facts":  Decide how many servings you will have.  Multiply the number of servings by the number of carbohydrates per serving.  The resulting number is the total amount of carbohydrates that you will be having. Learning standard serving sizes of other foods When you eat carbohydrate foods that are not packaged or do not include "Nutrition Facts" on the label, you need to measure the servings in order to count the amount of carbohydrates:  Measure the foods that you will eat with a food scale or measuring cup, if  needed.  Decide how many standard-size servings you will eat.  Multiply the number of servings by 15. Most carbohydrate-rich foods have about 15 g of carbohydrates per serving. ? For example, if you eat 8 oz (170 g) of strawberries, you will have eaten 2 servings and 30 g of carbohydrates (2 servings x 15 g = 30 g).  For foods that have more than one food mixed, such as soups and casseroles, you must count the carbohydrates in each food that is included. The following list contains standard serving sizes of common carbohydrate-rich foods. Each of these servings has about 15 g of carbohydrates:   hamburger bun or  English muffin.   oz (15 mL) syrup.   oz (14 g) jelly.  1 slice of bread.  1 six-inch tortilla.  3 oz (85 g) cooked rice or pasta.  4 oz (113 g) cooked dried beans.  4 oz (113 g) starchy vegetable, such as peas, corn, or potatoes.  4 oz (113 g) hot cereal.  4 oz (113 g) mashed potatoes or  of a large baked potato.  4 oz (113 g) canned or frozen fruit.  4 oz (120 mL) fruit juice.  4-6 crackers.  6 chicken nuggets.  6 oz (170 g) unsweetened dry cereal.  6 oz (170 g) plain fat-free yogurt or yogurt sweetened with artificial sweeteners.  8 oz (240 mL) milk.  8 oz (170 g) fresh fruit or one small piece of fruit.  24 oz (680 g) popped popcorn. Example of carbohydrate counting Sample meal  3 oz (85 g) chicken breast.  6 oz (170 g)   brown rice.  4 oz (113 g) corn.  8 oz (240 mL) milk.  8 oz (170 g) strawberries with sugar-free whipped topping. Carbohydrate calculation 1. Identify the foods that contain carbohydrates: ? Rice. ? Corn. ? Milk. ? Strawberries. 2. Calculate how many servings you have of each food: ? 2 servings rice. ? 1 serving corn. ? 1 serving milk. ? 1 serving strawberries. 3. Multiply each number of servings by 15 g: ? 2 servings rice x 15 g = 30 g. ? 1 serving corn x 15 g = 15 g. ? 1 serving milk x 15 g = 15 g. ? 1  serving strawberries x 15 g = 15 g. 4. Add together all of the amounts to find the total grams of carbohydrates eaten: ? 30 g + 15 g + 15 g + 15 g = 75 g of carbohydrates total. Summary  Carbohydrate counting is a method of keeping track of how many carbohydrates you eat.  Eating carbohydrates naturally increases the amount of sugar (glucose) in the blood.  Counting how many carbohydrates you eat helps keep your blood glucose within normal limits, which helps you manage your diabetes.  A diet and nutrition specialist (registered dietitian) can help you make a meal plan and calculate how many carbohydrates you should have at each meal and snack. This information is not intended to replace advice given to you by your health care provider. Make sure you discuss any questions you have with your health care provider. Document Revised: 01/20/2017 Document Reviewed: 12/10/2015 Elsevier Patient Education  2020 [employer]vier Inc.  

## 2019-08-02 NOTE — Assessment & Plan Note (Signed)
Well controlled, no changes to meds. Encouraged heart healthy diet such as the DASH diet and exercise as tolerated.  °

## 2019-08-02 NOTE — Assessment & Plan Note (Signed)
F/u rheum

## 2019-08-02 NOTE — Progress Notes (Signed)
Patient ID: [name], female    DOB: Jul 06, 1956  Age: 64 y.o. MRN: [id number]    Subjective:  Subjective  HPI [name] presents for f/u cm, chol and bp.  Pt is having a lot of pain with RA and knees.   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   Ortho said she needs a knee replacement .    HYPERTENSION   Blood pressure range-not checking   Chest pain- nono      Dyspnea-no Lightheadedness- no   Edema- no  Other side effects - no   Medication compliance: good Low salt diet- yes    DIABETES    Blood Sugar ranges-94-118  Polyuria- no New Visual problems- no  Hypoglycemic symptoms- no  Other side effects-no Medication compliance - good Last eye exam- 07/16/2019 Foot exam- today   HYPERLIPIDEMIA  Medication compliance- good RUQ pain- no  Muscle aches- no Other side effects-no       Review of Systems  Constitutional: Negative for appetite change, diaphoresis, fatigue and unexpected weight change.  Eyes: Negative for pain, redness and visual disturbance.  Respiratory: Negative for cough, chest tightness, shortness of breath and wheezing.   Cardiovascular: Negative for chest pain, palpitations and leg swelling.  Endocrine: Negative for cold intolerance, heat intolerance, polydipsia, polyphagia and polyuria.  Genitourinary: Negative for difficulty urinating, dysuria and frequency.  Musculoskeletal: Positive for arthralgias, gait problem and joint swelling.  Neurological: Negative for dizziness, light-headedness, numbness and headaches.    History Past Medical History:  Diagnosis Date  . Allergic rhinitis   . Anxiety   . Asthma    hx bronchial asthma at times with upper resp infection  . Depression   . Diabetes mellitus without complication (HCC)    diet controlled no meds  . Fibromyalgia   . GERD (gastroesophageal reflux disease)   . Headache(784.0)    migraine and cluster headaches  . Hyperlipemia   . Hypertension   . IBS (irritable bowel  syndrome)   . Menopause   . Neuromuscular disorder ([place]) 2009   hx of fibromyalgia, polyarthralsia (surgery induced)  . Stress incontinence    at times  . Tibial fracture 10/14/2016   evulsion periostial right    She has a past surgical history that includes Cesarean section ([place]); Cervical laminectomy (2005 & 2009); Foot surgery (2005); right knee (1981); Nasal sinus surgery ([place]); Laparoscopic gastric banding (12/30/09); Esophagogastroduodenoscopy (06/29/2011); Hysteroscopy (1999); laparoscopy (09/24/2011); Gastric banding port revision (09/24/2011); Tonsillectomy (1971); Cholecystectomy (1986); Tubal ligation (1993); right knee arthroscopy and arthrotomy [id number]); Cervical conization w/bx (june 1990); laparoscopy (07/03/2012); Laparoscopic revision of gastric band (07/03/2012); Mesh applied to lap port (07/03/2012); Dilation and curettage of uterus ( april-1989); Laparoscopic repair and removal of gastric band (N/A, 07/02/2013); Laparoscopic gastric sleeve resection (N/A, 07/02/2013); Abdominal hysterectomy (12/1999); Gastric Roux-En-Y (N/A, 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   Her family history includes Atrial fibrillation in her mother; Breast cancer in her mother; COPD in an other family member; Cancer in her mother; Diabetes in her father and mother; Heart attack in her father; Heart disease in an other family member; Hypertension in her father and mother; Stroke in her father and mother; Thyroid disease in an other family member.She reports that she has never smoked. She has never used smokeless tobacco. She reports that she does not drink alcohol or use drugs.  Current Outpatient Medications on File Prior to Visit  Medication Sig Dispense Refill  .  Abatacept (ORENCIA CLICKJECT) 0000000 MG/ML SOAJ Inject 1 mL into the skin once a week. 4 mL 5  . acetaminophen (TYLENOL 8 HOUR ARTHRITIS PAIN) 650 MG CR tablet Take 650 mg by mouth 2 (two) times daily as needed  for pain.    . Ca Phosphate-Cholecalciferol (CALTRATE GUMMY BITES PO) Take 2 each 2 (two) times daily by mouth.     . cyclobenzaprine (FLEXERIL) 10 MG tablet Take 1 tablet (10 mg total) by mouth 3 (three) times daily as needed. for muscle spams 30 tablet 1  . estradiol (VIVELLE-DOT) 0.1 MG/24HR Place 1 patch (0.1 mg total) onto the skin 2 (two) times a week. Monday and Thursday (Patient taking differently: Place 1 patch onto the skin 2 (two) times a week. Mondays and Thursdays) 8 patch 11  . folic acid (FOLVITE) 1 MG tablet Take 1 mg by mouth 3 (three) times daily as needed.     Marland Kitchen glucose blood test strip Test blood sugar once daily. Dx Code: E11.9 100 each 12  . hydroxychloroquine (PLAQUENIL) 200 MG tablet 200 mg 2 (two) times daily.     Marland Kitchen loratadine (CLARITIN) 10 MG tablet Take 10 mg by mouth every evening.     . methotrexate 50 MG/2ML injection 50 mg/m2 once a week.     Marland Kitchen MISC NATURAL PRODUCTS PO Take 1,000 mg by mouth.     . Pediatric Multivit-Minerals-C (ONE-A-DAY SCOOBY-DOO GUMMIES PO) Take 1 tablet 2 (two) times daily by mouth.     Marland Kitchen PREMARIN vaginal cream Apply 1 application topically 2 (two) times a week. Mondays & Thursdays.  1  . temazepam (RESTORIL) 30 MG capsule Take 1 capsule (30 mg total) by mouth at bedtime as needed for sleep. 90 capsule 3  . thiamine (VITAMIN B-1) 100 MG tablet Take 100 mg daily by mouth.    . fluticasone (FLONASE) 50 MCG/ACT nasal spray Place 2 sprays into both nostrils daily. (Patient not taking: Reported on 08/02/2019) 16 g 6  . nitroGLYCERIN (NITRODUR - DOSED IN MG/24 HR) 0.2 mg/hr patch Apply 1/4th patch to affected shoulder, change daily (Patient not taking: Reported on 08/02/2019) 30 patch 1  . valACYclovir (VALTREX) 1000 MG tablet Take 1 tablet (1,000 mg total) by mouth 3 (three) times daily. (Patient not taking: Reported on 08/02/2019) 30 tablet 0   No current facility-administered medications on file prior to visit.     Objective:  Objective  Physical  Exam Vitals and nursing note reviewed.  Constitutional:      Appearance: She is well-developed.  HENT:     Head: Normocephalic and atraumatic.  Eyes:     Conjunctiva/sclera: Conjunctivae normal.  Neck:     Thyroid: No thyromegaly.     Vascular: No carotid bruit or JVD.  Cardiovascular:     Rate and Rhythm: Normal rate and regular rhythm.     Heart sounds: Normal heart sounds. No murmur.  Pulmonary:     Effort: Pulmonary effort is normal. No respiratory distress.     Breath sounds: Normal breath sounds. No wheezing or rales.  Chest:     Chest wall: No tenderness.  Musculoskeletal:        General: Swelling and tenderness present.     Cervical back: Normal range of motion and neck supple.  Neurological:     Mental Status: She is alert and oriented to person, place, and time.    Diabetic Foot Exam - Simple   Simple Foot Form Diabetic Foot exam was performed with the following  findings: Yes 08/02/2019  8:38 AM  Visual Inspection No deformities, no ulcerations, no other skin breakdown bilaterally: Yes Sensation Testing Intact to touch and monofilament testing bilaterally: Yes Pulse Check Posterior Tibialis and Dorsalis pulse intact bilaterally: Yes Comments     BP 130/68 (BP Location: Right Arm, Patient Position: Sitting, Cuff Size: Normal)   Pulse 68   Temp 97.6 F (36.4 C) (Temporal)   Resp 18   Ht 5\' 11"  (1.803 m)   Wt 194 lb (88 kg)   SpO2 98%   BMI 27.06 kg/m  Wt Readings from Last 3 Encounters:  08/02/19 194 lb (88 kg)  05/31/19 195 lb (88.5 kg)  04/14/19 200 lb (90.7 kg)     Lab Results  Component Value Date   WBC 8.9 08/24/2018   HGB 13.8 08/24/2018   HCT 42.4 08/24/2018   PLT 259.0 08/24/2018   GLUCOSE 111 (H) 08/24/2018   CHOL 117 01/26/2019   TRIG 65 01/26/2019   HDL 63 01/26/2019   LDLCALC 40 01/26/2019   ALT 48 (H) 08/24/2018   AST 25 08/24/2018   NA 140 08/24/2018   K 5.3 (H) 08/24/2018   CL 100 08/24/2018   CREATININE 0.77 08/24/2018    BUN 11 08/24/2018   CO2 34 (H) 08/24/2018   TSH 1.78 01/26/2019   HGBA1C 6.3 (H) 01/26/2019   MICROALBUR 0.1 08/01/2014    MM 3D SCREEN BREAST BILATERAL  Result Date: 07/18/2019 CLINICAL DATA:  Screening. EXAM: DIGITAL SCREENING BILATERAL MAMMOGRAM WITH TOMO AND CAD COMPARISON:  Previous exam(s). ACR Breast Density Category b: There are scattered areas of fibroglandular density. FINDINGS: There are no findings suspicious for malignancy. Images were processed with CAD. IMPRESSION: No mammographic evidence of malignancy. A result letter of this screening mammogram will be mailed directly to the patient. RECOMMENDATION: Screening mammogram in one year. (Code:SM-B-01Y) BI-RADS CATEGORY  1: Negative. Electronically Signed   By: Audie Pinto M.D.   On: 07/18/2019 16:59     Assessment & Plan:  Plan  I have discontinued Charmaine Downs. Gasser's mupirocin ointment, amoxicillin-clavulanate, cefdinir, and promethazine-codeine. I have also changed her potassium chloride and rosuvastatin. Additionally, I am having her maintain her loratadine, Ca Phosphate-Cholecalciferol (CALTRATE GUMMY BITES PO), Pediatric Multivit-Minerals-C (ONE-A-DAY SCOOBY-DOO GUMMIES PO), estradiol, Premarin, thiamine, acetaminophen, nitroGLYCERIN, MISC NATURAL PRODUCTS PO, glucose blood, cyclobenzaprine, temazepam, valACYclovir, Orencia ClickJect, fluticasone, folic acid, hydroxychloroquine, methotrexate, HYDROcodone-acetaminophen, ALPRAZolam, DULoxetine, furosemide, methocarbamol, and olmesartan.  Meds ordered this encounter  Medications  . HYDROcodone-acetaminophen (NORCO/VICODIN) 5-325 MG tablet    Sig: Take 1 tablet by mouth every 6 (six) hours as needed for moderate pain.    Dispense:  45 tablet    Refill:  0  . ALPRAZolam (XANAX) 0.25 MG tablet    Sig: Take 1 tablet (0.25 mg total) by mouth 2 (two) times daily as needed for anxiety.    Dispense:  20 tablet    Refill:  0  . DULoxetine (CYMBALTA) 30 MG capsule    Sig: Take 2  capsules (60 mg total) by mouth daily.    Dispense:  180 capsule    Refill:  3  . furosemide (LASIX) 20 MG tablet    Sig: TAKE 1 TABLET BY MOUTH EVERY MORNING    Dispense:  90 tablet    Refill:  1  . methocarbamol (ROBAXIN) 500 MG tablet    Sig: Take 1 tablet (500 mg total) by mouth 4 (four) times daily.    Dispense:  45 tablet    Refill:  1  . olmesartan (BENICAR) 5 MG tablet    Sig: Take 1 tablet (5 mg total) by mouth daily.    Dispense:  90 tablet    Refill:  3  . potassium chloride (KLOR-CON) 10 MEQ tablet    Sig: TAKE 1 TABLET (10 MEQ TOTAL) BY MOUTH DAILY.    Dispense:  90 tablet    Refill:  1  . rosuvastatin (CRESTOR) 20 MG tablet    Sig: 1 po qd    Dispense:  90 tablet    Refill:  2    Problem List Items Addressed This Visit      Unprioritized   Cervical radiculopathy   Relevant Medications   ALPRAZolam (XANAX) 0.25 MG tablet   DULoxetine (CYMBALTA) 30 MG capsule   methocarbamol (ROBAXIN) 500 MG tablet   Controlled type 2 diabetes mellitus with complication, without long-term current use of insulin (HCC) - Primary    Check labs hgba1c to be checked, minimize simple carbs. Increase exercise as tolerated. Continue current meds       Relevant Medications   olmesartan (BENICAR) 5 MG tablet   rosuvastatin (CRESTOR) 20 MG tablet   Other Relevant Orders   Hemoglobin A1c   Microalbumin / creatinine urine ratio   Ambulatory referral to Podiatry   VITAMIN D 25 Hydroxy (Vit-D Deficiency, Fractures)   Insulin, random   Essential hypertension    Well controlled, no changes to meds. Encouraged heart healthy diet such as the DASH diet and exercise as tolerated.       Relevant Medications   furosemide (LASIX) 20 MG tablet   olmesartan (BENICAR) 5 MG tablet   potassium chloride (KLOR-CON) 10 MEQ tablet   rosuvastatin (CRESTOR) 20 MG tablet   Other Relevant Orders   Lipid panel   Comprehensive metabolic panel   Hyperlipidemia   Relevant Medications   furosemide  (LASIX) 20 MG tablet   olmesartan (BENICAR) 5 MG tablet   rosuvastatin (CRESTOR) 20 MG tablet   Hyperlipidemia associated with type 2 diabetes mellitus (HCC)   Relevant Medications   olmesartan (BENICAR) 5 MG tablet   rosuvastatin (CRESTOR) 20 MG tablet   Other Relevant Orders   Lipid panel   Comprehensive metabolic panel   Hyperlipidemia LDL goal <100    Encouraged heart healthy diet, increase exercise, avoid trans fats, consider a krill oil cap daily      Relevant Medications   furosemide (LASIX) 20 MG tablet   olmesartan (BENICAR) 5 MG tablet   rosuvastatin (CRESTOR) 20 MG tablet   Primary osteoarthritis of both knees    F/u ortho Refill pain med for now       Relevant Medications   hydroxychloroquine (PLAQUENIL) 200 MG tablet   methotrexate 50 MG/2ML injection   HYDROcodone-acetaminophen (NORCO/VICODIN) 5-325 MG tablet   methocarbamol (ROBAXIN) 500 MG tablet   Rheumatoid arthritis involving multiple sites with positive rheumatoid factor (HCC)    F/u rheum       Relevant Medications   hydroxychloroquine (PLAQUENIL) 200 MG tablet   methotrexate 50 MG/2ML injection   HYDROcodone-acetaminophen (NORCO/VICODIN) 5-325 MG tablet   methocarbamol (ROBAXIN) 500 MG tablet    Other Visit Diagnoses    Anxiety       Relevant Medications   ALPRAZolam (XANAX) 0.25 MG tablet   DULoxetine (CYMBALTA) 30 MG capsule   Vitamin D deficiency       Relevant Orders   VITAMIN D 25 Hydroxy (Vit-D Deficiency, Fractures)   Insulin, random  Follow-up: Return in about 6 months (around 01/30/2020), or if symptoms worsen or fail to improve, for annual exam, fasting.  [name], DO

## 2019-08-02 NOTE — Assessment & Plan Note (Signed)
F/u ortho Refill pain med for now

## 2019-08-02 NOTE — Assessment & Plan Note (Signed)
Check labs  hgba1c to be checked, minimize simple carbs. Increase exercise as tolerated. Continue current meds  

## 2019-08-03 ENCOUNTER — Encounter: Payer: Self-pay | Admitting: Family Medicine

## 2019-08-03 LAB — INSULIN, RANDOM: Insulin: 5.8 u[IU]/mL

## 2019-08-07 MED FILL — METHOTREXATE 25 MG/ML VIAL: 50 | 30 days supply | Qty: 4 | Fill #0

## 2019-08-09 DIAGNOSIS — E119 Type 2 diabetes mellitus without complications: Secondary | ICD-10-CM | POA: Diagnosis not present

## 2019-08-09 DIAGNOSIS — L02619 Cutaneous abscess of unspecified foot: Secondary | ICD-10-CM | POA: Diagnosis not present

## 2019-08-09 DIAGNOSIS — M130 Polyarthritis, unspecified: Secondary | ICD-10-CM | POA: Diagnosis not present

## 2019-08-09 DIAGNOSIS — L03119 Cellulitis of unspecified part of limb: Secondary | ICD-10-CM | POA: Diagnosis not present

## 2019-08-09 DIAGNOSIS — M2550 Pain in unspecified joint: Secondary | ICD-10-CM | POA: Diagnosis not present

## 2019-08-09 DIAGNOSIS — Z9884 Bariatric surgery status: Secondary | ICD-10-CM | POA: Diagnosis not present

## 2019-08-09 DIAGNOSIS — Z7189 Other specified counseling: Secondary | ICD-10-CM | POA: Diagnosis not present

## 2019-08-09 DIAGNOSIS — Z79899 Other long term (current) drug therapy: Secondary | ICD-10-CM | POA: Diagnosis not present

## 2019-08-09 DIAGNOSIS — M0609 Rheumatoid arthritis without rheumatoid factor, multiple sites: Secondary | ICD-10-CM | POA: Diagnosis not present

## 2019-08-09 MED FILL — COLCHICINE 0.6 MG CAPSULE: 0.6 | 30 days supply | Qty: 30 | Fill #0

## 2019-08-16 DIAGNOSIS — Z01419 Encounter for gynecological examination (general) (routine) without abnormal findings: Secondary | ICD-10-CM | POA: Diagnosis not present

## 2019-08-16 DIAGNOSIS — N952 Postmenopausal atrophic vaginitis: Secondary | ICD-10-CM | POA: Diagnosis not present

## 2019-08-16 DIAGNOSIS — N951 Menopausal and female climacteric states: Secondary | ICD-10-CM | POA: Diagnosis not present

## 2019-08-16 DIAGNOSIS — Z6827 Body mass index (BMI) 27.0-27.9, adult: Secondary | ICD-10-CM | POA: Diagnosis not present

## 2019-08-16 MED FILL — DOTTI 0.1 MG/24HR PTTW: 0.1 | 84 days supply | Qty: 24 | Fill #0

## 2019-08-24 DIAGNOSIS — M25561 Pain in right knee: Secondary | ICD-10-CM | POA: Insufficient documentation

## 2019-08-27 DIAGNOSIS — M1711 Unilateral primary osteoarthritis, right knee: Secondary | ICD-10-CM | POA: Diagnosis not present

## 2019-08-27 DIAGNOSIS — M25561 Pain in right knee: Secondary | ICD-10-CM | POA: Diagnosis not present

## 2019-08-28 MED FILL — BD TB SYRINGE 27GX1/2: 27G X 1/2" | 90 days supply | Qty: 13 | Fill #1

## 2019-08-28 MED FILL — BD TB SYRINGE 27GX1/2": 27G X 1/2" | 90 days supply | Qty: 13 | Fill #1

## 2019-08-29 MED FILL — ORENCIA CLICKJECT 125 MG/ML: 125 | 28 days supply | Qty: 4 | Fill #1

## 2019-09-03 DIAGNOSIS — M1711 Unilateral primary osteoarthritis, right knee: Secondary | ICD-10-CM | POA: Diagnosis not present

## 2019-09-03 DIAGNOSIS — M25561 Pain in right knee: Secondary | ICD-10-CM | POA: Diagnosis not present

## 2019-09-10 DIAGNOSIS — M25561 Pain in right knee: Secondary | ICD-10-CM | POA: Diagnosis not present

## 2019-09-10 DIAGNOSIS — M1711 Unilateral primary osteoarthritis, right knee: Secondary | ICD-10-CM | POA: Diagnosis not present

## 2019-09-20 DIAGNOSIS — M130 Polyarthritis, unspecified: Secondary | ICD-10-CM | POA: Diagnosis not present

## 2019-09-20 DIAGNOSIS — E663 Overweight: Secondary | ICD-10-CM | POA: Diagnosis not present

## 2019-09-20 DIAGNOSIS — M2550 Pain in unspecified joint: Secondary | ICD-10-CM | POA: Diagnosis not present

## 2019-09-20 DIAGNOSIS — Z9884 Bariatric surgery status: Secondary | ICD-10-CM | POA: Diagnosis not present

## 2019-09-20 DIAGNOSIS — M0609 Rheumatoid arthritis without rheumatoid factor, multiple sites: Secondary | ICD-10-CM | POA: Diagnosis not present

## 2019-09-20 DIAGNOSIS — E119 Type 2 diabetes mellitus without complications: Secondary | ICD-10-CM | POA: Diagnosis not present

## 2019-09-20 DIAGNOSIS — Z79899 Other long term (current) drug therapy: Secondary | ICD-10-CM | POA: Diagnosis not present

## 2019-09-20 DIAGNOSIS — Z7189 Other specified counseling: Secondary | ICD-10-CM | POA: Diagnosis not present

## 2019-09-20 DIAGNOSIS — M1120 Other chondrocalcinosis, unspecified site: Secondary | ICD-10-CM | POA: Diagnosis not present

## 2019-09-20 MED FILL — LEFLUNOMIDE 20 MG TABLET: 20 | 30 days supply | Qty: 30 | Fill #0

## 2019-09-21 ENCOUNTER — Encounter: Payer: Self-pay | Admitting: Family Medicine

## 2019-09-24 ENCOUNTER — Other Ambulatory Visit: Payer: Self-pay

## 2019-09-25 ENCOUNTER — Ambulatory Visit: Payer: 59 | Admitting: Family Medicine

## 2019-09-25 ENCOUNTER — Encounter: Payer: Self-pay | Admitting: Family Medicine

## 2019-09-25 VITALS — BP 124/60 | HR 72 | Temp 97.3°F | Resp 18 | Ht 71.0 in | Wt 198.6 lb

## 2019-09-25 DIAGNOSIS — F411 Generalized anxiety disorder: Secondary | ICD-10-CM | POA: Diagnosis not present

## 2019-09-25 DIAGNOSIS — M0579 Rheumatoid arthritis with rheumatoid factor of multiple sites without organ or systems involvement: Secondary | ICD-10-CM

## 2019-09-25 DIAGNOSIS — M170 Bilateral primary osteoarthritis of knee: Secondary | ICD-10-CM | POA: Diagnosis not present

## 2019-09-25 DIAGNOSIS — I10 Essential (primary) hypertension: Secondary | ICD-10-CM

## 2019-09-25 MED ORDER — KETOROLAC TROMETHAMINE 60 MG/2ML IM SOLN
60.0000 mg | Freq: Once | INTRAMUSCULAR | Status: AC
  Administered 2019-09-25: 60 mg via INTRAMUSCULAR

## 2019-09-25 MED ORDER — DICLOFENAC SODIUM 1 % EX GEL: 4.0000 g | Freq: Four times a day (QID) | CUTANEOUS | 1 refills | Status: DC

## 2019-09-25 MED ORDER — NONFORMULARY OR COMPOUNDED ITEM: 0 refills | Status: DC

## 2019-09-25 MED FILL — OMRON 3 SERIES BP MONITOR D: 30 days supply | Qty: 1 | Fill #0

## 2019-09-25 MED FILL — DICLOFENAC SODIUM 1% GEL: 1 | 13 days supply | Qty: 200 | Fill #0
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
## 2019-09-25 NOTE — Patient Instructions (Signed)

## 2019-09-25 NOTE — Assessment & Plan Note (Signed)
Worsening Rheum is taking her out of work and will start disablility

## 2019-09-26 MED FILL — CYCLOBENZAPRINE HCL 10 MG T: 10 | 30 days supply | Qty: 30 | Fill #1

## 2019-09-26 MED FILL — METHOCARBAMOL 500 MG TABS: 500 | 11 days supply | Qty: 45 | Fill #0

## 2019-09-28 MED FILL — traMADol HCL 50 MG TABS: 50 | 14 days supply | Qty: 28 | Fill #0

## 2019-10-02 ENCOUNTER — Other Ambulatory Visit: Payer: Self-pay | Admitting: Family Medicine

## 2019-10-02 DIAGNOSIS — G4700 Insomnia, unspecified: Secondary | ICD-10-CM

## 2019-10-02 MED FILL — DULoxetine HCL 30 MG CPEP: 30 | 90 days supply | Qty: 180 | Fill #3

## 2019-10-02 MED FILL — TEMAZEPAM 30 MG CAPSULE: 30 | 90 days supply | Qty: 90 | Fill #0

## 2019-10-02 MED FILL — COLCHICINE 0.6 MG CAPSULE: 0.6 | 30 days supply | Qty: 30 | Fill #1

## 2019-10-02 MED FILL — ROSUVASTATIN CALCIUM 20 MG: 20 | 90 days supply | Qty: 90 | Fill #1

## 2019-10-02 MED FILL — OLMESARTAN MEDOXOMIL 5 MG T: 5 | 90 days supply | Qty: 90 | Fill #2

## 2019-10-02 NOTE — Telephone Encounter (Signed)
Requesting:temazepam  Contract:no UDS:n/a Last OV:09/25/19 Next OV:02/04/20 Last Refill:01/09/19 #[id number] Database:   Please advise

## 2019-10-02 NOTE — Telephone Encounter (Signed)
Pt came in office stating is needing meds ASAP, pt can not sleep. Please advise. Any question call pt at [phone].

## 2019-10-08 DIAGNOSIS — M0609 Rheumatoid arthritis without rheumatoid factor, multiple sites: Secondary | ICD-10-CM | POA: Diagnosis not present

## 2019-10-17 ENCOUNTER — Other Ambulatory Visit: Payer: Self-pay

## 2019-10-17 ENCOUNTER — Encounter: Payer: Self-pay | Admitting: Podiatry

## 2019-10-17 ENCOUNTER — Ambulatory Visit: Payer: 59 | Admitting: Podiatry

## 2019-10-17 VITALS — Temp 97.6°F

## 2019-10-17 DIAGNOSIS — E119 Type 2 diabetes mellitus without complications: Secondary | ICD-10-CM | POA: Diagnosis not present

## 2019-10-17 DIAGNOSIS — L84 Corns and callosities: Secondary | ICD-10-CM

## 2019-10-17 DIAGNOSIS — B351 Tinea unguium: Secondary | ICD-10-CM | POA: Diagnosis not present

## 2019-10-17 NOTE — Patient Instructions (Signed)
Diabetes Mellitus and Foot Care Foot care is an important part of your health, especially when you have diabetes. Diabetes may cause you to have problems because of poor blood flow (circulation) to your feet and legs, which can cause your skin to:  Become thinner and drier.  Break more easily.  Heal more slowly.  Peel and crack. You may also have nerve damage (neuropathy) in your legs and feet, causing decreased feeling in them. This means that you may not notice minor injuries to your feet that could lead to more serious problems. Noticing and addressing any potential problems early is the best way to prevent future foot problems. How to care for your feet Foot hygiene  Wash your feet daily with warm water and mild soap. Do not use hot water. Then, pat your feet and the areas between your toes until they are completely dry. Do not soak your feet as this can dry your skin.  Trim your toenails straight across. Do not dig under them or around the cuticle. File the edges of your nails with an emery board or nail file.  Apply a moisturizing lotion or petroleum jelly to the skin on your feet and to dry, brittle toenails. Use lotion that does not contain alcohol and is unscented. Do not apply lotion between your toes. Shoes and socks  Wear clean socks or stockings every day. Make sure they are not too tight. Do not wear knee-high stockings since they may decrease blood flow to your legs.  Wear shoes that fit properly and have enough cushioning. Always look in your shoes before you put them on to be sure there are no objects inside.  To break in new shoes, wear them for just a few hours a day. This prevents injuries on your feet. Wounds, scrapes, corns, and calluses  Check your feet daily for blisters, cuts, bruises, sores, and redness. If you cannot see the bottom of your feet, use a mirror or ask someone for help.  Do not cut corns or calluses or try to remove them with medicine.  If you  find a minor scrape, cut, or break in the skin on your feet, keep it and the skin around it clean and dry. You may clean these areas with mild soap and water. Do not clean the area with peroxide, alcohol, or iodine.  If you have a wound, scrape, corn, or callus on your foot, look at it several times a day to make sure it is healing and not infected. Check for: ? Redness, swelling, or pain. ? Fluid or blood. ? Warmth. ? Pus or a bad smell. General instructions  Do not cross your legs. This may decrease blood flow to your feet.  Do not use heating pads or hot water bottles on your feet. They may burn your skin. If you have lost feeling in your feet or legs, you may not know this is happening until it is too late.  Protect your feet from hot and cold by wearing shoes, such as at the beach or on hot pavement.  Schedule a complete foot exam at least once a year (annually) or more often if you have foot problems. If you have foot problems, report any cuts, sores, or bruises to your health care provider immediately. Contact a health care provider if:  You have a medical condition that increases your risk of infection and you have any cuts, sores, or bruises on your feet.  You have an injury that is not   healing.  You have redness on your legs or feet.  You feel burning or tingling in your legs or feet.  You have pain or cramps in your legs and feet.  Your legs or feet are numb.  Your feet always feel cold.  You have pain around a toenail. Get help right away if:  You have a wound, scrape, corn, or callus on your foot and: ? You have pain, swelling, or redness that gets worse. ? You have fluid or blood coming from the wound, scrape, corn, or callus. ? Your wound, scrape, corn, or callus feels warm to the touch. ? You have pus or a bad smell coming from the wound, scrape, corn, or callus. ? You have a fever. ? You have a red line going up your leg. Summary  Check your feet every day  for cuts, sores, red spots, swelling, and blisters.  Moisturize feet and legs daily.  Wear shoes that fit properly and have enough cushioning.  If you have foot problems, report any cuts, sores, or bruises to your health care provider immediately.  Schedule a complete foot exam at least once a year (annually) or more often if you have foot problems. This information is not intended to replace advice given to you by your health care provider. Make sure you discuss any questions you have with your health care provider. Document Revised: 03/21/2019 Document Reviewed: 07/30/2016 Elsevier Patient Education  2020 [employer]   Hammer Toe  Hammer toe is a change in the shape (a deformity) of your toe. The deformity causes the middle joint of your toe to stay bent. This causes pain, especially when you are wearing shoes. Hammer toe starts gradually. At first, the toe can be straightened. Gradually over time, the deformity becomes stiff and permanent. Early treatments to keep the toe straight may relieve pain. As the deformity becomes stiff and permanent, surgery may be needed to straighten the toe. What are the causes? Hammer toe is caused by abnormal bending of the toe joint that is closest to your foot. It happens gradually over time. This pulls on the muscles and connections (tendons) of the toe joint, making them weak and stiff. It is often related to wearing shoes that are too short or narrow and do not let your toes straighten. What increases the risk? You may be at greater risk for hammer toe if you:  Are female.  Are older.  Wear shoes that are too small.  Wear high-heeled shoes that pinch your toes.  Are a ballet dancer.  Have a second toe that is longer than your big toe (first toe).  Injure your foot or toe.  Have arthritis.  Have a family history of hammer toe.  Have a nerve or muscle disorder. What are the signs or symptoms? The main symptoms of this condition are  pain and deformity of the toe. The pain is worse when wearing shoes, walking, or running. Other symptoms may include:  Corns or calluses over the bent part of the toe or between the toes.  Redness and a burning feeling on the toe.  An open sore that forms on the top of the toe.  Not being able to straighten the toe. How is this diagnosed? This condition is diagnosed based on your symptoms and a physical exam. During the exam, your health care provider will try to straighten your toe to see how stiff the deformity is. You may also have tests, such as:  A blood test   check for rheumatoid arthritis.  An X-ray to show how severe the deformity is. How is this treated? Treatment for this condition will depend on how stiff the deformity is. Surgery is often needed. However, sometimes a hammer toe can be straightened without surgery. Treatments that do not involve surgery include:  Taping the toe into a straightened position.  Using pads and cushions to protect the toe (orthotics).  Wearing shoes that provide enough room for the toes.  Doing toe-stretching exercises at home.  Taking an NSAID to reduce pain and swelling. If these treatments do not help or the toe cannot be straightened, surgery is the next option. The most common surgeries used to straighten a hammer toe include:  Arthroplasty. In this procedure, part of the joint is removed, and that allows the toe to straighten.  Fusion. In this procedure, cartilage between the two bones of the joint is taken out and the bones are fused together into one longer bone.  Implantation. In this procedure, part of the bone is removed and replaced with an implant to let the toe move again.  Flexor tendon transfer. In this procedure, the tendons that curl the toes down (flexor tendons) are repositioned. Follow these instructions at home:  Take over-the-counter and prescription medicines only as told by your health care provider.  Do toe  straightening and stretching exercises as told by your health care provider.  Keep all follow-up visits as told by your health care provider. This is important. How is this prevented?  Wear shoes that give your toes enough room and do not cause pain.  Do not wear high-heeled shoes. Contact a health care provider if:  Your pain gets worse.  Your toe becomes red or swollen.  You develop an open sore on your toe. This information is not intended to replace advice given to you by your health care provider. Make sure you discuss any questions you have with your health care provider. Document Revised: 06/10/2017 Document Reviewed: 10/22/2015 Elsevier Patient Education  [place].  Diabetes Mellitus and [place] care is an important part of your health, especially when you have diabetes. Diabetes may cause you to have problems because of poor blood flow (circulation) to your feet and legs, which can cause your skin to:  Become thinner and drier.  Break more easily.  Heal more slowly.  Peel and crack. You may also have nerve damage (neuropathy) in your legs and feet, causing decreased feeling in them. This means that you may not notice minor injuries to your feet that could lead to more serious problems. Noticing and addressing any potential problems early is the best way to prevent future foot problems. How to care for your feet Foot hygiene  Wash your feet daily with warm water and mild soap. Do not use hot water. Then, pat your feet and the areas between your toes until they are completely dry. Do not soak your feet as this can dry your skin.  Trim your toenails straight across. Do not dig under them or around the cuticle. File the edges of your nails with an emery board or nail file.  Apply a moisturizing lotion or petroleum jelly to the skin on your feet and to dry, brittle toenails. Use lotion that does not contain alcohol and is unscented. Do not apply lotion  between your toes. Shoes and socks  Wear clean socks or stockings every day. Make sure they are not too tight. Do not wear knee-high stockings since  they may decrease blood flow to your legs.  Wear shoes that fit properly and have enough cushioning. Always look in your shoes before you put them on to be sure there are no objects inside.  To break in new shoes, wear them for just a few hours a day. This prevents injuries on your feet. Wounds, scrapes, corns, and calluses  Check your feet daily for blisters, cuts, bruises, sores, and redness. If you cannot see the bottom of your feet, use a mirror or ask someone for help.  Do not cut corns or calluses or try to remove them with medicine.  If you find a minor scrape, cut, or break in the skin on your feet, keep it and the skin around it clean and dry. You may clean these areas with mild soap and water. Do not clean the area with peroxide, alcohol, or iodine.  If you have a wound, scrape, corn, or callus on your foot, look at it several times a day to make sure it is healing and not infected. Check for: ? Redness, swelling, or pain. ? Fluid or blood. ? Warmth. ? Pus or a bad smell. General instructions  Do not cross your legs. This may decrease blood flow to your feet.  Do not use heating pads or hot water bottles on your feet. They may burn your skin. If you have lost feeling in your feet or legs, you may not know this is happening until it is too late.  Protect your feet from hot and cold by wearing shoes, such as at the beach or on hot pavement.  Schedule a complete foot exam at least once a year (annually) or more often if you have foot problems. If you have foot problems, report any cuts, sores, or bruises to your health care provider immediately. Contact a health care provider if:  You have a medical condition that increases your risk of infection and you have any cuts, sores, or bruises on your feet.  You have an injury that is  not healing.  You have redness on your legs or feet.  You feel burning or tingling in your legs or feet.  You have pain or cramps in your legs and feet.  Your legs or feet are numb.  Your feet always feel cold.  You have pain around a toenail. Get help right away if:  You have a wound, scrape, corn, or callus on your foot and: ? You have pain, swelling, or redness that gets worse. ? You have fluid or blood coming from the wound, scrape, corn, or callus. ? Your wound, scrape, corn, or callus feels warm to the touch. ? You have pus or a bad smell coming from the wound, scrape, corn, or callus. ? You have a fever. ? You have a red line going up your leg. Summary  Check your feet every day for cuts, sores, red spots, swelling, and blisters.  Moisturize feet and legs daily.  Wear shoes that fit properly and have enough cushioning.  If you have foot problems, report any cuts, sores, or bruises to your health care provider immediately.  Schedule a complete foot exam at least once a year (annually) or more often if you have foot problems. This information is not intended to replace advice given to you by your health care provider. Make sure you discuss any questions you have with your health care provider. Document Revised: 03/21/2019 Document Reviewed: 07/30/2016 Elsevier Patient Education  [place].

## 2019-10-19 ENCOUNTER — Encounter: Payer: Self-pay | Admitting: Family Medicine

## 2019-10-19 ENCOUNTER — Other Ambulatory Visit: Payer: Self-pay | Admitting: Family Medicine

## 2019-10-19 DIAGNOSIS — M0579 Rheumatoid arthritis with rheumatoid factor of multiple sites without organ or systems involvement: Secondary | ICD-10-CM

## 2019-10-19 MED ORDER — TRAMADOL HCL 50 MG PO TABS: 50.0000 mg | ORAL_TABLET | Freq: Three times a day (TID) | ORAL | 0 refills | Status: AC | PRN

## 2019-10-19 MED FILL — traMADol HCL 50 MG TABS: 50 | 5 days supply | Qty: 15 | Fill #0

## 2019-10-19 MED FILL — LEFLUNOMIDE 20 MG TABLET: 20 | 30 days supply | Qty: 30 | Fill #1

## 2019-10-22 DIAGNOSIS — M0609 Rheumatoid arthritis without rheumatoid factor, multiple sites: Secondary | ICD-10-CM | POA: Diagnosis not present

## 2019-10-22 NOTE — Progress Notes (Signed)
Subjective: [name] presents today referred by Carollee Herter, Alferd Apa, DO for diabetic foot evaluation.  Patient relates 14 year history of diabetes.  Patient denies any history of foot wounds.  Patient acknowledges positive history of numbness to tips of toes.  Today, patient c/o of painful corn right 2nd digit.  Past Medical History:  Diagnosis Date  . Allergic rhinitis   . Anxiety   . Asthma    hx bronchial asthma at times with upper resp infection  . Depression   . Diabetes mellitus without complication (HCC)    diet controlled no meds  . Fibromyalgia   . GERD (gastroesophageal reflux disease)   . Headache(784.0)    migraine and cluster headaches  . Hyperlipemia   . Hypertension   . IBS (irritable bowel syndrome)   . Menopause   . Neuromuscular disorder ([place]) 2009   hx of fibromyalgia, polyarthralsia (surgery induced)  . Stress incontinence    at times  . Tibial fracture 10/14/2016   evulsion periostial right    Patient Active Problem List   Diagnosis Date Noted  . Rheumatoid arthritis involving multiple sites with positive rheumatoid factor ([place]) 08/02/2019  . Primary osteoarthritis of both knees 08/02/2019  . PTSD (post-traumatic stress disorder) 02/08/2019  . Dog bite of arm, right, initial encounter 10/05/2018  . Hyperlipidemia associated with type 2 diabetes mellitus ([place]) 08/24/2018  . Symptomatic abdominal panniculus 12/12/2017  . Hyperlipidemia 10/06/2017  . Cervical radiculopathy 10/06/2017  . 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 Keratosis 12/04/2014  . Onychocryptosis 08/21/2014  . Onychomycosis 05/21/2014  . Fissured skin 01/22/2014  . Fissure in skin of foot 12/18/2013  . Porokeratosis 12/18/2013  . Pain in lower limb 11/21/2013  . Ingrown nail 07/20/2013  . 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 Obesity (BMI 30-39.9) 06/25/2013  . GERD (gastroesophageal reflux disease) 08/20/2011  . Lapband APL + [facility] repair  June 2011-Major revision Dec 2013 06/29/2011  . ABDOMINAL PAIN, ACUTE 08/01/2010  . VITAMIN B12 DEFICIENCY 04/01/2010  . BARIATRIC SURGERY STATUS 01/29/2010  . ONYCHOMYCOSIS, BILATERAL 06/30/2009  . STRESS INCONTINENCE 06/10/2009  . ACUTE PHARYNGITIS 04/29/2009  . COUGH 04/27/2009  . BRONCHITIS, ACUTE 04/16/2009  . NAUSEA 04/08/2009  . DIARRHEA 04/08/2009  . MORBID OBESITY 03/03/2009  . SKIN RASH 11/18/2008  . UNSPECIFIED VITAMIN D DEFICIENCY 07/15/2008  . OTHER SPECIFIED ANEMIAS 07/15/2008  . Pain in joint, multiple sites 06/13/2008  . Myalgia and myositis, unspecified 06/13/2008  . BACK PAIN, LUMBAR 02/14/2008  . ACUTE SINUSITIS, UNSPECIFIED 09/01/2007  . CELLULITIS 08/12/2007  . Anxiety state 06/09/2007  . DEPRESSION 06/09/2007  . Controlled type 2 diabetes mellitus with complication, without long-term current use of insulin ([place]) 01/04/2007  . Hyperlipidemia LDL goal <100 01/04/2007  . Essential hypertension 01/04/2007  . ALLERGIC RHINITIS 01/04/2007  . HEADACHE 01/04/2007    Past Surgical History:  Procedure Laterality Date  . ABDOMINAL HYSTERECTOMY  12/1999   complete  . CARPAL TUNNEL RELEASE Right 12/27/2017   Procedure: CARPAL TUNNEL RELEASE;  Surgeon: Roseanne Kaufman, MD;  Location: [facility];  Service: Orthopedics;  Laterality: Right;  . CERVICAL CONIZATION W/BX  june 1990   dysplasia of cervix/used 5Fu cream for 3 months  . CERVICAL LAMINECTOMY  2005 & 2009   X 2   NO ROM PROBLEMS  . [place]  . DILATION AND CURETTAGE OF UTERUS   [phone]  missed abortion  . ESOPHAGOGASTRODUODENOSCOPY  06/29/2011   Procedure: ESOPHAGOGASTRODUODENOSCOPY (EGD);  Surgeon: Shann Medal, MD;  Location: [facility];  Service: General;  Laterality: N/A;  . FOOT SURGERY  2005   RT HEEL  . GASTRIC BANDING PORT REVISION  09/24/2011   Procedure: GASTRIC BANDING PORT REVISION;  Surgeon: Pedro Earls, MD;  Location: [facility];   Service: General;  Laterality: N/A;  . GASTRIC ROUX-EN-Y N/A 06/06/2017   Procedure: Conversion from Sleeve to [place];  Surgeon: Johnathan Hausen, MD;  Location: [facility];  Service: General;  Laterality: N/A;  . HYSTEROSCOPY  1999  . LAPAROSCOPIC GASTRIC BANDING  12/30/09  . LAPAROSCOPIC GASTRIC SLEEVE RESECTION N/A 07/02/2013   Procedure: LAPAROSCOPIC GASTRIC SLEEVE RESECTION upper endoscopy;  Surgeon: Pedro Earls, MD;  Location: [facility];  Service: General;  Laterality: N/A;  . LAPAROSCOPIC REPAIR AND REMOVAL OF GASTRIC BAND N/A 07/02/2013   Procedure: LAPAROSCOPIC REMOVAL OF GASTRIC BAND;  Surgeon: Pedro Earls, MD;  Location[facility];  Service: General;  Laterality: N/A;  . LAPAROSCOPIC REVISION OF GASTRIC BAND  07/03/2012   Procedure: LAPAROSCOPIC REVISION OF GASTRIC BAND;  Surgeon: Pedro Earls, MD;  Location[facility];  Service: General;  Laterality: N/A;  removal of old lap. band port, replaced with AP standard band  . LAPAROSCOPY  09/24/2011   Procedure: LAPAROSCOPY DIAGNOSTIC;  Surgeon: Pedro Earls, MD;  [facility];  Service: General;  Laterality: N/A;  . LAPAROSCOPY  07/03/2012   Procedure: LAPAROSCOPY DIAGNOSTIC;  Surgeon: Pedro Earls, MD;  [facility];  Service: General;  Laterality: N/A;  Exploratory Laparoscopy   . MESH APPLIED TO LAP PORT  07/03/2012   Procedure: MESH APPLIED TO LAP PORT;  Surgeon: Pedro Earls, MD;  Location[facility];  Service: General;  Laterality: N/A;  . [place]   X 2  . right knee  1981   ARTHROSCOPY AND ARTHROTOMY  . right knee arthroscopy and arthrotomy  12-1979  . TONSILLECTOMY  1971  . TUBAL LIGATION  1993   WITH C -SECTION    Current Outpatient Medications on File Prior to Visit  Medication Sig Dispense Refill  . acetaminophen (TYLENOL 8 HOUR ARTHRITIS PAIN) 650 MG CR tablet Take 650 mg by mouth 2 (two) times daily as needed for pain.    Marland Kitchen  ALPRAZolam (XANAX) 0.25 MG tablet Take 1 tablet (0.25 mg total) by mouth 2 (two) times daily as needed for anxiety. 20 tablet 0  . Ca Phosphate-Cholecalciferol (CALTRATE GUMMY BITES PO) Take 2 each 2 (two) times daily by mouth.     . cyclobenzaprine (FLEXERIL) 10 MG tablet Take 1 tablet (10 mg total) by mouth 3 (three) times daily as needed. for muscle spams 30 tablet 1  . diclofenac Sodium (VOLTAREN) 1 % GEL Apply 4 g topically 4 (four) times daily. 150 g 1  . DULoxetine (CYMBALTA) 30 MG capsule Take 2 capsules (60 mg total) by mouth daily. 180 capsule 3  . estradiol (VIVELLE-DOT) 0.1 MG/24HR Place 1 patch (0.1 mg total) onto the skin 2 (two) times a week. Monday and Thursday (Patient taking differently: Place 1 patch onto the skin 2 (two) times a week. Mondays and Thursdays) 8 patch 11  . folic acid (FOLVITE) 1 MG tablet Take 1 mg by mouth 3 (three) times daily as needed.     . furosemide (LASIX) 20 MG tablet TAKE 1 TABLET BY MOUTH  EVERY MORNING 90 tablet 1  . glucose blood test strip Test blood sugar once daily. Dx Code: E11.9 100 each 12  . hydroxychloroquine (PLAQUENIL) 200 MG tablet 200 mg 2 (two) times daily.     Marland Kitchen leflunomide (ARAVA) 20 MG tablet Take 20 mg by mouth daily.    Marland Kitchen loratadine (CLARITIN) 10 MG tablet Take 10 mg by mouth every evening.     . methocarbamol (ROBAXIN) 500 MG tablet Take 1 tablet (500 mg total) by mouth 4 (four) times daily. 45 tablet 1  . NONFORMULARY OR COMPOUNDED ITEM bp cuff  Dx hypertension 1 each 0  . olmesartan (BENICAR) 5 MG tablet Take 1 tablet (5 mg total) by mouth daily. 90 tablet 3  . Pediatric Multivit-Minerals-C (ONE-A-DAY SCOOBY-DOO GUMMIES PO) Take 1 tablet 2 (two) times daily by mouth.     . potassium chloride (KLOR-CON) 10 MEQ tablet TAKE 1 TABLET (10 MEQ TOTAL) BY MOUTH DAILY. 90 tablet 1  . PREMARIN vaginal cream Apply 1 application topically 2 (two) times a week. Mondays & Thursdays.  1  . rosuvastatin (CRESTOR) 20 MG tablet 1 po qd 90 tablet 2   . temazepam (RESTORIL) 30 MG capsule TAKE 1 CAPSULE (30 MG TOTAL) BY MOUTH AT BEDTIME AS NEEDED FOR SLEEP. 90 capsule 3  . thiamine (VITAMIN B-1) 100 MG tablet Take 100 mg daily by mouth.     No current facility-administered medications on file prior to visit.     Allergies  Allergen Reactions  . Isoniazid     Severe SOB  . Nitrofurantoin Shortness Of Breath    REACTION: welts  . Hctz [Hydrochlorothiazide]     rash  . Promethazine Hcl [Promethazine Hcl]     IF GIVEN  IV-hallucinations     CAN TAKE PO PHENERGAN  . Tamiflu [Oseltamivir Phosphate] Nausea And Vomiting    "I vomited within 30 minutes of taking it and was told I cannot ever take it again."  . Macrolides And Ketolides     rash  . Morphine Other (See Comments)    severe vomiting  . Percocet [Oxycodone-Acetaminophen]     REACTION: severe itching. Can take by mouth codeine  . Roxicet [Oxycodone-Acetaminophen]     itching  . Sulfamethoxazole-Trimethoprim     Septra / Bactrim  --REACTION: rash  . Fentanyl Hives and Rash    REACTION: welts    Social History   Occupational History  . Occupation: Programmer, multimedia: [employer]  Tobacco Use  . Smoking status: Never Smoker  . Smokeless tobacco: Never Used  Substance and Sexual Activity  . Alcohol use: No    Alcohol/week: 0.0 standard drinks  . Drug use: No  . Sexual activity: Yes    Birth control/protection: None    Family History  Problem Relation Age of Onset  . Diabetes Mother   . Stroke Mother   . Breast cancer Mother   . Atrial fibrillation Mother   . Hypertension Mother   . Cancer Mother        breast  . Diabetes Father   . Stroke Father   . Hypertension Father   . Heart attack Father   . Thyroid disease Other   . Heart disease Other   . COPD Other     Immunization History  Administered Date(s) Administered  . H1N1 07/23/2008  . Influenza Whole 04/24/2008  . Influenza,inj,Quad PF,6+ Mos 04/11/2013, 04/04/2019  .  Influenza-Unspecified 04/25/2014, 03/13/2015, 03/12/2016  . Pneumococcal Polysaccharide-23 07/04/2013  .  Td 01/02/2009  . Tdap 10/04/2018  . Zoster Recombinat (Shingrix) 03/21/2017, 05/25/2017    Review of systems: Positive Findings in bold print.  Constitutional:  chills, fatigue, fever, sweats, weight change Communication: Optometrist, sign Ecologist, hand writing, iPad/Android device Head: headaches, head injury Eyes: changes in vision, eye pain, glaucoma, cataracts, macular degeneration, diplopia, glare,  light sensitivity, eyeglasses or contacts, blindness Ears nose mouth throat: hearing impaired, hearing aids,  ringing in ears, deaf, sign language,  vertigo, nosebleeds,  rhinitis,  cold sores, snoring, swollen glands Cardiovascular: HTN, edema, arrhythmia, pacemaker in place, defibrillator in place, chest pain/tightness, chronic anticoagulation, blood clot, heart failure, MI Peripheral Vascular: leg cramps, varicose veins, blood clots, lymphedema, varicosities Respiratory:  difficulty breathing, denies congestion, SOB, wheezing, cough, emphysema Gastrointestinal: change in appetite or weight, abdominal pain, constipation, diarrhea, nausea, vomiting, vomiting blood, change in bowel habits, abdominal pain, jaundice, rectal bleeding, hemorrhoids, GERD Genitourinary:  nocturia,  pain on urination, polyuria,  blood in urine, Foley catheter, urinary urgency, ESRD on hemodialysis Musculoskeletal: amputation, cramping, stiff joints, painful joints, decreased joint motion, fractures, OA, gout, hemiplegia, paraplegia, uses cane, wheelchair bound, uses walker, uses rollator Skin: +changes in toenails, color change, dryness, itching, mole changes,  rash, wound(s) Neurological: headaches, numbness in feet, paresthesias in feet, burning in feet, fainting,  seizures, change in speech,  headaches, memory problems/poor historian, cerebral palsy, weakness, paralysis, CVA, TIA Endocrine: diabetes,  hypothyroidism, hyperthyroidism,  goiter, dry mouth, flushing, heat intolerance,  cold intolerance,  excessive thirst, denies polyuria,  nocturia Hematological:  easy bleeding, excessive bleeding, easy bruising, enlarged lymph nodes, on long term blood thinner, history of past transusions Allergy/immunological:  hives, eczema, frequent infections, multiple drug allergies, seasonal allergies, transplant recipient, multiple food allergies Psychiatric:  anxiety, depression, mood disorder, suicidal ideations, hallucinations, insomnia  Objective: Vitals:   10/17/19 1003  Temp: 97.6 F (36.4 C)    64 y.o. Caucasian female WD, WN IN NAD. AAO X 3.  Vascular Examination: Capillary refill time to digits immediate b/l. Palpable DP pulses b/l. Palpable PT pulses b/l. Pedal hair sparse b/l. Skin temperature gradient within normal limits b/l.  Dermatological Examination: Pedal skin with normal turgor, texture and tone bilaterally. No open wounds bilaterally. No interdigital macerations bilaterally. Toenails 1-5 b/l elongated, dystrophic, thickened, crumbly with subungual debris and tenderness to dorsal palpation. Hyperkeratotic lesion(s) R 2nd toe.  No erythema, no edema, no drainage, no flocculence.  Musculoskeletal Examination: Normal muscle strength 5/5 to all lower extremity muscle groups bilaterally, no pain crepitus or joint limitation noted with ROM b/l and hammertoes noted to the  R 2nd toe.  Neurological Examination: Protective sensation intact 5/5 intact bilaterally with 10g monofilament b/l, vibratory sensation intact b/l, Babinski reflex negative b/l, Achilles reflex 2+ b/l, and clonus negative b/l.  Assessment: 1. ONYCHOMYCOSIS, BILATERAL   2. Corns   3. Controlled type 2 diabetes mellitus without complication, without long-term current use of insulin ([place])     Plan: -Diabetic foot examination performed on today's visit. -Toenails 1-5 b/l were debrided in length and girth with sterile  nail nippers and dremel without iatrogenic bleeding.  -Corn(s) debrided R 2nd toe without complication or incident. Total number debrided=1. -Patient to continue soft, supportive shoe gear daily. -Patient to report any pedal injuries to medical professional immediately. -Patient/POA to call should there be question/concern in the interim.  Return in about 3 months (around 01/16/2020) for diabetic nail and callus trim.

## 2019-10-25 DIAGNOSIS — M65341 Trigger finger, right ring finger: Secondary | ICD-10-CM | POA: Diagnosis not present

## 2019-10-25 DIAGNOSIS — M79641 Pain in right hand: Secondary | ICD-10-CM | POA: Diagnosis not present

## 2019-10-25 MED FILL — HYDROCODON-APAP 5-325: 5-325 | 5 days supply | Qty: 30 | Fill #0

## 2019-10-26 MED FILL — HYDROXYCHLOROQUINE 200 MG T: 200 | 30 days supply | Qty: 60 | Fill #0

## 2019-10-30 ENCOUNTER — Encounter: Payer: Self-pay | Admitting: Family Medicine

## 2019-10-31 ENCOUNTER — Other Ambulatory Visit: Payer: Self-pay | Admitting: Family Medicine

## 2019-10-31 DIAGNOSIS — B354 Tinea corporis: Secondary | ICD-10-CM

## 2019-10-31 MED ORDER — NYSTATIN 100000 UNIT/GM EX POWD: 1.0000 "application " | Freq: Three times a day (TID) | CUTANEOUS | 0 refills | Status: DC

## 2019-10-31 MED FILL — NYSTATIN 100,000 UNIT/GM PO: 100000 | 10 days supply | Qty: 15 | Fill #0

## 2019-10-31 NOTE — Telephone Encounter (Signed)
Nystatin powder sent [facility] does not use epicin the office  ---she will need to let dr Hassell Done know

## 2019-11-01 DIAGNOSIS — Z7189 Other specified counseling: Secondary | ICD-10-CM | POA: Diagnosis not present

## 2019-11-01 DIAGNOSIS — F439 Reaction to severe stress, unspecified: Secondary | ICD-10-CM | POA: Diagnosis not present

## 2019-11-01 DIAGNOSIS — M0609 Rheumatoid arthritis without rheumatoid factor, multiple sites: Secondary | ICD-10-CM | POA: Diagnosis not present

## 2019-11-01 DIAGNOSIS — E119 Type 2 diabetes mellitus without complications: Secondary | ICD-10-CM | POA: Diagnosis not present

## 2019-11-01 DIAGNOSIS — M1120 Other chondrocalcinosis, unspecified site: Secondary | ICD-10-CM | POA: Diagnosis not present

## 2019-11-01 DIAGNOSIS — G894 Chronic pain syndrome: Secondary | ICD-10-CM | POA: Diagnosis not present

## 2019-11-01 DIAGNOSIS — M130 Polyarthritis, unspecified: Secondary | ICD-10-CM | POA: Diagnosis not present

## 2019-11-01 DIAGNOSIS — M2550 Pain in unspecified joint: Secondary | ICD-10-CM | POA: Diagnosis not present

## 2019-11-01 DIAGNOSIS — Z79899 Other long term (current) drug therapy: Secondary | ICD-10-CM | POA: Diagnosis not present

## 2019-11-01 MED FILL — COLCHICINE 0.6 MG CAPSULE: 0.6 | 30 days supply | Qty: 30 | Fill #2

## 2019-11-01 MED FILL — PREGABALIN 75 MG CAPS: 75 | 30 days supply | Qty: 30 | Fill #0

## 2019-11-01 MED FILL — CYCLOBENZAPRINE HCL 10 MG T: 10 | 30 days supply | Qty: 30 | Fill #0

## 2019-11-07 DIAGNOSIS — M793 Panniculitis, unspecified: Secondary | ICD-10-CM | POA: Diagnosis not present

## 2019-11-07 DIAGNOSIS — Z9884 Bariatric surgery status: Secondary | ICD-10-CM | POA: Diagnosis not present

## 2019-11-08 MED FILL — DICLOFENAC SODIUM 1% GEL: 1 | 13 days supply | Qty: 200 | Fill #1

## 2019-11-19 DIAGNOSIS — M0609 Rheumatoid arthritis without rheumatoid factor, multiple sites: Secondary | ICD-10-CM | POA: Diagnosis not present

## 2019-11-22 DIAGNOSIS — M79641 Pain in right hand: Secondary | ICD-10-CM | POA: Diagnosis not present

## 2019-11-22 DIAGNOSIS — M65341 Trigger finger, right ring finger: Secondary | ICD-10-CM | POA: Diagnosis not present

## 2019-11-26 MED FILL — LEFLUNOMIDE 20 MG TABLET: 20 | 30 days supply | Qty: 30 | Fill #2

## 2019-11-27 ENCOUNTER — Ambulatory Visit (INDEPENDENT_AMBULATORY_CARE_PROVIDER_SITE_OTHER): Payer: 59 | Admitting: Psychology

## 2019-11-27 DIAGNOSIS — F4323 Adjustment disorder with mixed anxiety and depressed mood: Secondary | ICD-10-CM

## 2019-11-29 ENCOUNTER — Other Ambulatory Visit (HOSPITAL_BASED_OUTPATIENT_CLINIC_OR_DEPARTMENT_OTHER): Payer: Self-pay | Admitting: Physician Assistant

## 2019-11-29 DIAGNOSIS — F329 Major depressive disorder, single episode, unspecified: Secondary | ICD-10-CM | POA: Diagnosis not present

## 2019-11-29 DIAGNOSIS — G894 Chronic pain syndrome: Secondary | ICD-10-CM | POA: Diagnosis not present

## 2019-11-29 DIAGNOSIS — Z79899 Other long term (current) drug therapy: Secondary | ICD-10-CM | POA: Diagnosis not present

## 2019-11-29 DIAGNOSIS — M130 Polyarthritis, unspecified: Secondary | ICD-10-CM | POA: Diagnosis not present

## 2019-11-29 DIAGNOSIS — E119 Type 2 diabetes mellitus without complications: Secondary | ICD-10-CM | POA: Diagnosis not present

## 2019-11-29 DIAGNOSIS — M2550 Pain in unspecified joint: Secondary | ICD-10-CM | POA: Diagnosis not present

## 2019-11-29 DIAGNOSIS — M0609 Rheumatoid arthritis without rheumatoid factor, multiple sites: Secondary | ICD-10-CM | POA: Diagnosis not present

## 2019-11-29 DIAGNOSIS — F439 Reaction to severe stress, unspecified: Secondary | ICD-10-CM | POA: Diagnosis not present

## 2019-11-29 DIAGNOSIS — Z7189 Other specified counseling: Secondary | ICD-10-CM | POA: Diagnosis not present

## 2019-11-29 MED FILL — COLCHICINE 0.6 MG CAPSULE: 0.6 | 30 days supply | Qty: 30 | Fill #0

## 2019-11-29 MED FILL — PREGABALIN 75 MG CAPS: 75 | 30 days supply | Qty: 30 | Fill #1

## 2019-11-29 MED FILL — HYDROXYCHLOROQUINE 200 MG T: 200 | 30 days supply | Qty: 60 | Fill #0

## 2019-11-29 MED FILL — CYCLOBENZAPRINE HCL 10 MG T: 10 | 30 days supply | Qty: 30 | Fill #0

## 2019-12-03 ENCOUNTER — Encounter: Payer: Self-pay | Admitting: Family Medicine

## 2019-12-03 ENCOUNTER — Telehealth: Payer: Self-pay | Admitting: Family Medicine

## 2019-12-03 ENCOUNTER — Other Ambulatory Visit: Payer: Self-pay

## 2019-12-03 ENCOUNTER — Ambulatory Visit: Payer: 59 | Admitting: Family Medicine

## 2019-12-03 DIAGNOSIS — F418 Other specified anxiety disorders: Secondary | ICD-10-CM

## 2019-12-03 DIAGNOSIS — M0579 Rheumatoid arthritis with rheumatoid factor of multiple sites without organ or systems involvement: Secondary | ICD-10-CM

## 2019-12-03 DIAGNOSIS — M5412 Radiculopathy, cervical region: Secondary | ICD-10-CM

## 2019-12-03 NOTE — Assessment & Plan Note (Addendum)
con't counseling con't meds  fmla filled out for chronic pain and rheum arthritis  Counselor will fill out one for depression/ anxiety

## 2019-12-03 NOTE — Assessment & Plan Note (Signed)
Pt being referred to pain management by rheum

## 2019-12-03 NOTE — Telephone Encounter (Signed)
Call back phone [phone]  Patient would like to apologize that she answered her cell phone while you were still talking to her. Patient states if you still need to talk to her please call her back.

## 2019-12-03 NOTE — Assessment & Plan Note (Signed)
Per rheum  fmla paperwork filled out -

## 2019-12-03 NOTE — Telephone Encounter (Signed)
Its ok--- we were done

## 2019-12-03 NOTE — Progress Notes (Signed)
Patient ID: [name], female    DOB: 1955-10-28  Age: 64 y.o. MRN: [id number]    Subjective:  Subjective  HPI Amy RYNER presents for fmla paperwork to be filled out.  She is upset because she is in so much pain and rheum said they will release her to work in 1 month----- - rheum is referring her to pain management.  Pt is also seeing Dr Amedeo Plenty for her hands and Terri bauret for counseling   Pt is also going to functional medicine to see what she can and cant do .     Review of Systems  Constitutional: Negative for appetite change, diaphoresis, fatigue and unexpected weight change.  Eyes: Negative for pain, redness and visual disturbance.  Respiratory: Negative for cough, chest tightness, shortness of breath and wheezing.   Cardiovascular: Negative for chest pain, palpitations and leg swelling.  Endocrine: Negative for cold intolerance, heat intolerance, polydipsia, polyphagia and polyuria.  Genitourinary: Negative for difficulty urinating, dysuria and frequency.  Musculoskeletal: Positive for arthralgias, back pain, joint swelling, myalgias and neck pain.  Neurological: Negative for dizziness, light-headedness, numbness and headaches.    History Past Medical History:  Diagnosis Date  . Allergic rhinitis   . Anxiety   . Asthma    hx bronchial asthma at times with upper resp infection  . Depression   . Diabetes mellitus without complication (HCC)    diet controlled no meds  . Fibromyalgia   . GERD (gastroesophageal reflux disease)   . Headache(784.0)    migraine and cluster headaches  . Hyperlipemia   . Hypertension   . IBS (irritable bowel syndrome)   . Menopause   . Neuromuscular disorder ([place]) 2009   hx of fibromyalgia, polyarthralsia (surgery induced)  . Stress incontinence    at times  . Tibial fracture 10/14/2016   evulsion periostial right    She has a past surgical history that includes Cesarean section ([place]); Cervical laminectomy (2005 &  2009); Foot surgery (2005); right knee (1981); Nasal sinus surgery ([place]); Laparoscopic gastric banding (12/30/09); Esophagogastroduodenoscopy (06/29/2011); Hysteroscopy (1999); laparoscopy (09/24/2011); Gastric banding port revision (09/24/2011); Tonsillectomy (1971); Cholecystectomy (1986); Tubal ligation (1993); right knee arthroscopy and arthrotomy [id number]); Cervical conization w/bx (june 1990); laparoscopy (07/03/2012); Laparoscopic revision of gastric band (07/03/2012); Mesh applied to lap port (07/03/2012); Dilation and curettage of uterus ( april-1989); Laparoscopic repair and removal of gastric band (N/A, 07/02/2013); Laparoscopic gastric sleeve resection (N/A, 07/02/2013); Abdominal hysterectomy (12/1999); Gastric Roux-En-Y (N/A, 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   Her family history includes Atrial fibrillation in her mother; Breast cancer in her mother; COPD in an other family member; Cancer in her mother; Diabetes in her father and mother; Heart attack in her father; Heart disease in an other family member; Hypertension in her father and mother; Stroke in her father and mother; Thyroid disease in an other family member.She reports that she has never smoked. She has never used smokeless tobacco. She reports that she does not drink alcohol or use drugs.  Current Outpatient Medications on File Prior to Visit  Medication Sig Dispense Refill  . acetaminophen (TYLENOL 8 HOUR ARTHRITIS PAIN) 650 MG CR tablet Take 650 mg by mouth 2 (two) times daily as needed for pain.    Marland Kitchen ALPRAZolam (XANAX) 0.25 MG tablet Take 1 tablet (0.25 mg total) by mouth 2 (two) times daily as needed for anxiety. 20 tablet 0  . Ca Phosphate-Cholecalciferol (CALTRATE GUMMY BITES PO) Take 2 each 2 (  two) times daily by mouth.     . cyclobenzaprine (FLEXERIL) 10 MG tablet Take 1 tablet (10 mg total) by mouth 3 (three) times daily as needed. for muscle spams 30 tablet 1  . diclofenac Sodium (VOLTAREN) 1  % GEL Apply 4 g topically 4 (four) times daily. 150 g 1  . DULoxetine (CYMBALTA) 30 MG capsule Take 2 capsules (60 mg total) by mouth daily. 180 capsule 3  . estradiol (VIVELLE-DOT) 0.1 MG/24HR Place 1 patch (0.1 mg total) onto the skin 2 (two) times a week. Monday and Thursday (Patient taking differently: Place 1 patch onto the skin 2 (two) times a week. Mondays and Thursdays) 8 patch 11  . folic acid (FOLVITE) 1 MG tablet Take 1 mg by mouth 3 (three) times daily as needed.     . furosemide (LASIX) 20 MG tablet TAKE 1 TABLET BY MOUTH EVERY MORNING 90 tablet 1  . glucose blood test strip Test blood sugar once daily. Dx Code: E11.9 100 each 12  . hydroxychloroquine (PLAQUENIL) 200 MG tablet 200 mg 2 (two) times daily.     Marland Kitchen leflunomide (ARAVA) 20 MG tablet Take 20 mg by mouth daily.    Marland Kitchen loratadine (CLARITIN) 10 MG tablet Take 10 mg by mouth every evening.     . methocarbamol (ROBAXIN) 500 MG tablet Take 1 tablet (500 mg total) by mouth 4 (four) times daily. 45 tablet 1  . NONFORMULARY OR COMPOUNDED ITEM bp cuff  Dx hypertension 1 each 0  . nystatin (MYCOSTATIN/NYSTOP) powder Apply 1 application topically 3 (three) times daily. 15 g 0  . olmesartan (BENICAR) 5 MG tablet Take 1 tablet (5 mg total) by mouth daily. 90 tablet 3  . Pediatric Multivit-Minerals-C (ONE-A-DAY SCOOBY-DOO GUMMIES PO) Take 1 tablet 2 (two) times daily by mouth.     . potassium chloride (KLOR-CON) 10 MEQ tablet TAKE 1 TABLET (10 MEQ TOTAL) BY MOUTH DAILY. 90 tablet 1  . pregabalin (LYRICA) 75 MG capsule Take 75 mg by mouth daily.    Marland Kitchen PREMARIN vaginal cream Apply 1 application topically 2 (two) times a week. Mondays & Thursdays.  1  . rosuvastatin (CRESTOR) 20 MG tablet 1 po qd 90 tablet 2  . temazepam (RESTORIL) 30 MG capsule TAKE 1 CAPSULE (30 MG TOTAL) BY MOUTH AT BEDTIME AS NEEDED FOR SLEEP. 90 capsule 3  . thiamine (VITAMIN B-1) 100 MG tablet Take 100 mg daily by mouth.     No current facility-administered medications on  file prior to visit.     Objective:  Objective  Physical Exam Vitals and nursing note reviewed.  Constitutional:      Appearance: She is well-developed.  HENT:     Head: Normocephalic and atraumatic.  Eyes:     Conjunctiva/sclera: Conjunctivae normal.  Neck:     Thyroid: No thyromegaly.     Vascular: No carotid bruit or JVD.  Cardiovascular:     Rate and Rhythm: Normal rate and regular rhythm.     Heart sounds: Normal heart sounds. No murmur.  Pulmonary:     Effort: Pulmonary effort is normal. No respiratory distress.     Breath sounds: Normal breath sounds. No wheezing or rales.  Chest:     Chest wall: No tenderness.  Musculoskeletal:     Cervical back: Normal range of motion and neck supple.  Neurological:     Mental Status: She is alert and oriented to person, place, and time.  Psychiatric:        Attention  and Perception: Attention normal.        Mood and Affect: Mood is anxious and depressed. Affect is tearful.        Speech: Speech normal.        Behavior: Behavior normal.        Thought Content: Thought content normal.        Cognition and Memory: Cognition normal.    BP 140/70 (BP Location: Right Arm, Patient Position: Sitting, Cuff Size: Normal)   Pulse 72   Temp (!) 97.2 F (36.2 C) (Temporal)   Resp 18   Ht 5\' 11"  (1.803 m)   Wt 183 lb (83 kg)   SpO2 97%   BMI 25.52 kg/m  Wt Readings from Last 3 Encounters:  12/03/19 183 lb (83 kg)  09/25/19 198 lb 9.6 oz (90.1 kg)  08/02/19 194 lb (88 kg)     Lab Results  Component Value Date   WBC 8.9 08/24/2018   HGB 13.8 08/24/2018   HCT 42.4 08/24/2018   PLT 259.0 08/24/2018   GLUCOSE 97 08/02/2019   CHOL 117 08/02/2019   TRIG 50.0 08/02/2019   HDL 71.60 08/02/2019   LDLCALC 35 08/02/2019   ALT 37 (H) 08/02/2019   AST 24 08/02/2019   NA 139 08/02/2019   K 4.1 08/02/2019   CL 104 08/02/2019   CREATININE 0.72 08/02/2019   BUN 9 08/02/2019   CO2 31 08/02/2019   TSH 1.78 01/26/2019   HGBA1C 6.3  08/02/2019   MICROALBUR 0.8 08/02/2019    MM 3D SCREEN BREAST BILATERAL  Result Date: 07/18/2019 CLINICAL DATA:  Screening. EXAM: DIGITAL SCREENING BILATERAL MAMMOGRAM WITH TOMO AND CAD COMPARISON:  Previous exam(s). ACR Breast Density Category b: There are scattered areas of fibroglandular density. FINDINGS: There are no findings suspicious for malignancy. Images were processed with CAD. IMPRESSION: No mammographic evidence of malignancy. A result letter of this screening mammogram will be mailed directly to the patient. RECOMMENDATION: Screening mammogram in one year. (Code:SM-B-01Y) BI-RADS CATEGORY  1: Negative. Electronically Signed   By: Audie Pinto M.D.   On: 07/18/2019 16:59     Assessment & Plan:  Plan  I am having Bonanza Maring maintain her loratadine, Ca Phosphate-Cholecalciferol (CALTRATE GUMMY BITES PO), Pediatric Multivit-Minerals-C (ONE-A-DAY SCOOBY-DOO GUMMIES PO), estradiol, Premarin, thiamine, acetaminophen, glucose blood, cyclobenzaprine, folic acid, hydroxychloroquine, ALPRAZolam, DULoxetine, furosemide, methocarbamol, olmesartan, potassium chloride, rosuvastatin, leflunomide, diclofenac Sodium, NONFORMULARY OR COMPOUNDED ITEM, temazepam, nystatin, and pregabalin.  No orders of the defined types were placed in this encounter.   Problem List Items Addressed This Visit    None      Follow-up: Return in about 4 weeks (around 12/31/2019), or if symptoms worsen or fail to improve.  [name], DO

## 2019-12-03 NOTE — Patient Instructions (Signed)
Rheumatoid Arthritis Rheumatoid arthritis (RA) is a long-term (chronic) disease that causes inflammation in your joints. RA may start slowly. It most often affects the small joints of the hands and feet. Usually, the same joints are affected on both sides of your body. Inflammation from RA can also affect other parts of your body, including your heart, eyes, or lungs. There is no cure for RA, but medicines can help your symptoms and halt or slow down the progression of the disease. What are the causes? RA is an autoimmune disease. When you have an autoimmune disease, your body's defense system (immune system) mistakenly attacks healthy body tissues. The exact cause of RA is not known. What increases the risk? You are more likely to develop this condition if you:  Are a woman.  Have a family history of RA or other autoimmune diseases.  Have a history of smoking.  Are obese.  Have been exposed to pollutants or chemicals. What are the signs or symptoms? The first symptom of this condition may be morning stiffness that lasts longer than 30 minutes.  Symptoms usually start gradually. They are often worse in the morning. As RA progresses, symptoms may include:  Pain, stiffness, swelling, warmth, and tenderness in joints on both sides of your body.  Loss of energy.  Loss of appetite.  Weight loss.  Low-grade fever.  Dry eyes and dry mouth.  Firm lumps (rheumatoid nodules) that grow beneath your skin in areas such as your forearm bones near your elbows and on your hands.  Changes in the appearance of joints (deformity) and loss of joint function. Symptoms of this condition vary from person to person.  Symptoms of RA often come and go.  Sometimes, symptoms get worse for a period of time. These are called flares. How is this diagnosed? This condition is diagnosed based on your symptoms, medical history, and physical exam.  You may have X-rays or an MRI to check for the type of  joint changes that are caused by RA. You may also have blood tests to look for:  Proteins (antibodies) that your immune system may make if you have RA. These include rheumatoid factor (RF) and anti-CCP. ? When blood tests show these proteins, you are said to have "seropositive RA." ? When blood tests do not show these proteins, you may have "seronegative RA."  Inflammation in your blood.  A low number of red blood cells (anemia). How is this treated? The goals of treatment are to relieve pain, reduce inflammation, and slow down or stop joint damage and disability. Treatment may include:  Lifestyle changes. It is important to rest as needed, eat a healthy diet, and exercise.  Medicines. Your health care provider may adjust your medicines every 3 months until treatment goals are reached. Common medicines include: ? Pain relievers (analgesics). ? Corticosteroids and NSAIDs to reduce inflammation. ? Disease-modifying antirheumatic drugs (DMARDs) to try to slow the course of the disease. ? Biologic response modifiers to reduce inflammation and damage.  Physical therapy and occupational therapy.  Surgery, if you have severe joint damage. Joint replacement or fusing of joints may be needed. Your health care provider will work with you to identify the best treatment option for you based on assessment of the overall disease activity in your body. Follow these instructions at home: Activity  Return to your normal activities as told by your health care provider. Ask your health care provider what activities are safe for you.  Rest when you are having a   flare.  Start an exercise program as told by your health care provider. General instructions  Keep all follow-up visits as told by your health care provider. This is important.  Take over-the-counter and prescription medicines only as told by your health care provider. Where to find more information  American College of Rheumatology:  www.rheumatology.org  Arthritis Foundation: www.arthritis.org Contact a health care provider if:  You have a flare-up of RA symptoms.  You have a fever.  You have side effects from your medicines. Get help right away if:  You have chest pain.  You have trouble breathing.  You quickly develop a hot, painful joint that is more severe than your usual joint aches. Summary  Rheumatoid arthritis (RA) is a long-term (chronic) disease that causes inflammation in your joints.  RA is an autoimmune disease.  The goals of treatment are to relieve pain, reduce inflammation, and slow down or stop joint damage and disability. This information is not intended to replace advice given to you by your health care provider. Make sure you discuss any questions you have with your health care provider. Document Revised: 01/09/2019 Document Reviewed: 02/28/2018 Elsevier Patient Education  2020 Elsevier Inc.  

## 2019-12-11 ENCOUNTER — Encounter: Payer: Self-pay | Admitting: Podiatry

## 2019-12-11 ENCOUNTER — Other Ambulatory Visit: Payer: Self-pay

## 2019-12-11 ENCOUNTER — Ambulatory Visit: Payer: 59 | Admitting: Podiatry

## 2019-12-11 DIAGNOSIS — M79675 Pain in left toe(s): Secondary | ICD-10-CM

## 2019-12-11 DIAGNOSIS — B351 Tinea unguium: Secondary | ICD-10-CM

## 2019-12-11 DIAGNOSIS — L853 Xerosis cutis: Secondary | ICD-10-CM

## 2019-12-11 DIAGNOSIS — M79674 Pain in right toe(s): Secondary | ICD-10-CM | POA: Diagnosis not present

## 2019-12-11 NOTE — Patient Instructions (Signed)

## 2019-12-13 ENCOUNTER — Ambulatory Visit (INDEPENDENT_AMBULATORY_CARE_PROVIDER_SITE_OTHER): Payer: 59 | Admitting: Psychology

## 2019-12-13 DIAGNOSIS — F4323 Adjustment disorder with mixed anxiety and depressed mood: Secondary | ICD-10-CM | POA: Diagnosis not present

## 2019-12-16 NOTE — Progress Notes (Signed)
Subjective: [name] is a 64 y.o. female patient seen today preventative diabetic foot care and painful mycotic nails b/l that are difficult to trim. Pain interferes with ambulation. Aggravating factors include wearing enclosed shoe gear. Pain is relieved with periodic professional debridement.   She states her skin seems to be dry and cracked and thinks it's from her infusion medication.  Patient Active Problem List   Diagnosis Date Noted  . Rheumatoid arthritis involving multiple sites with positive rheumatoid factor ([place]) 08/02/2019  . Primary osteoarthritis of both knees 08/02/2019  . PTSD (post-traumatic stress disorder) 02/08/2019  . Dog bite of arm, right, initial encounter 10/05/2018  . Hyperlipidemia associated with type 2 diabetes mellitus ([place]) 08/24/2018  . Symptomatic abdominal panniculus 12/12/2017  . Hyperlipidemia 10/06/2017  . Cervical radiculopathy 10/06/2017  . 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 Keratosis 12/04/2014  . Onychocryptosis 08/21/2014  . Onychomycosis 05/21/2014  . Fissured skin 01/22/2014  . Fissure in skin of foot 12/18/2013  . Porokeratosis 12/18/2013  . Pain in lower limb 11/21/2013  . Ingrown nail 07/20/2013  . 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 Obesity (BMI 30-39.9) 06/25/2013  . GERD (gastroesophageal reflux disease) 08/20/2011  . Lapband APL + [facility] repair June 2011-Major revision Dec 2013 06/29/2011  . ABDOMINAL PAIN, ACUTE 08/01/2010  . VITAMIN B12 DEFICIENCY 04/01/2010  . BARIATRIC SURGERY STATUS 01/29/2010  . ONYCHOMYCOSIS, BILATERAL 06/30/2009  . STRESS INCONTINENCE 06/10/2009  . ACUTE PHARYNGITIS 04/29/2009  . COUGH 04/27/2009  . BRONCHITIS, ACUTE 04/16/2009  . NAUSEA 04/08/2009  . DIARRHEA 04/08/2009  . MORBID OBESITY 03/03/2009  . SKIN RASH 11/18/2008  . UNSPECIFIED VITAMIN D DEFICIENCY 07/15/2008  . OTHER SPECIFIED ANEMIAS 07/15/2008  . Pain in joint, multiple sites 06/13/2008  . Myalgia  and myositis, unspecified 06/13/2008  . BACK PAIN, LUMBAR 02/14/2008  . ACUTE SINUSITIS, UNSPECIFIED 09/01/2007  . CELLULITIS 08/12/2007  . Depression with anxiety 06/09/2007  . DEPRESSION 06/09/2007  . Controlled type 2 diabetes mellitus with complication, without long-term current use of insulin ([place]) 01/04/2007  . Hyperlipidemia LDL goal <100 01/04/2007  . Essential hypertension 01/04/2007  . ALLERGIC RHINITIS 01/04/2007  . HEADACHE 01/04/2007    Current Outpatient Medications on File Prior to Visit  Medication Sig Dispense Refill  . acetaminophen (TYLENOL 8 HOUR ARTHRITIS PAIN) 650 MG CR tablet Take 650 mg by mouth 2 (two) times daily as needed for pain.    Marland Kitchen ALPRAZolam (XANAX) 0.25 MG tablet Take 1 tablet (0.25 mg total) by mouth 2 (two) times daily as needed for anxiety. 20 tablet 0  . Ca Phosphate-Cholecalciferol (CALTRATE GUMMY BITES PO) Take 2 each 2 (two) times daily by mouth.     . cyclobenzaprine (FLEXERIL) 10 MG tablet Take 1 tablet (10 mg total) by mouth 3 (three) times daily as needed. for muscle spams 30 tablet 1  . diclofenac Sodium (VOLTAREN) 1 % GEL Apply 4 g topically 4 (four) times daily. 150 g 1  . DULoxetine (CYMBALTA) 30 MG capsule Take 2 capsules (60 mg total) by mouth daily. 180 capsule 3  . estradiol (VIVELLE-DOT) 0.1 MG/24HR Place 1 patch (0.1 mg total) onto the skin 2 (two) times a week. Monday and Thursday (Patient taking differently: Place 1 patch onto the skin 2 (two) times a week. Mondays and Thursdays) 8 patch 11  . folic acid (FOLVITE) 1 MG tablet Take 1 mg by mouth 3 (three) times daily as needed.     . furosemide (LASIX) 20 MG  tablet TAKE 1 TABLET BY MOUTH EVERY MORNING 90 tablet 1  . glucose blood test strip Test blood sugar once daily. Dx Code: E11.9 100 each 12  . hydroxychloroquine (PLAQUENIL) 200 MG tablet 200 mg 2 (two) times daily.     Marland Kitchen leflunomide (ARAVA) 20 MG tablet Take 20 mg by mouth daily.    Marland Kitchen loratadine (CLARITIN) 10 MG tablet Take 10 mg  by mouth every evening.     . methocarbamol (ROBAXIN) 500 MG tablet Take 1 tablet (500 mg total) by mouth 4 (four) times daily. 45 tablet 1  . NONFORMULARY OR COMPOUNDED ITEM bp cuff  Dx hypertension 1 each 0  . nystatin (MYCOSTATIN/NYSTOP) powder Apply 1 application topically 3 (three) times daily. 15 g 0  . olmesartan (BENICAR) 5 MG tablet Take 1 tablet (5 mg total) by mouth daily. 90 tablet 3  . Pediatric Multivit-Minerals-C (ONE-A-DAY SCOOBY-DOO GUMMIES PO) Take 1 tablet 2 (two) times daily by mouth.     . potassium chloride (KLOR-CON) 10 MEQ tablet TAKE 1 TABLET (10 MEQ TOTAL) BY MOUTH DAILY. 90 tablet 1  . pregabalin (LYRICA) 75 MG capsule Take 75 mg by mouth daily.    Marland Kitchen PREMARIN vaginal cream Apply 1 application topically 2 (two) times a week. Mondays & Thursdays.  1  . rosuvastatin (CRESTOR) 20 MG tablet 1 po qd 90 tablet 2  . temazepam (RESTORIL) 30 MG capsule TAKE 1 CAPSULE (30 MG TOTAL) BY MOUTH AT BEDTIME AS NEEDED FOR SLEEP. 90 capsule 3  . thiamine (VITAMIN B-1) 100 MG tablet Take 100 mg daily by mouth.     No current facility-administered medications on file prior to visit.    Allergies  Allergen Reactions  . Isoniazid     Severe SOB  . Nitrofurantoin Shortness Of Breath    REACTION: welts  . Hctz [Hydrochlorothiazide]     rash  . Promethazine Hcl [Promethazine Hcl]     IF GIVEN  IV-hallucinations     CAN TAKE PO PHENERGAN  . Tamiflu [Oseltamivir Phosphate] Nausea And Vomiting    "I vomited within 30 minutes of taking it and was told I cannot ever take it again."  . Macrolides And Ketolides     rash  . Morphine Other (See Comments)    severe vomiting  . Percocet [Oxycodone-Acetaminophen]     REACTION: severe itching. Can take by mouth codeine  . Roxicet [Oxycodone-Acetaminophen]     itching  . Sulfamethoxazole-Trimethoprim     Septra / Bactrim  --REACTION: rash  . Fentanyl Hives and Rash    REACTION: welts    Objective: Physical Exam  General: Patient is a  pleasant 64 y.o. Caucasian female in no acute distress, Awake, alert and oriented x 3.  Neurovascular status unchanged b/l.  Capillary refill time to digits immediate b/l. Palpable DP pulses b/l. Palpable PT pulses b/l. Pedal hair sparse b/l. Skin temperature gradient within normal limits b/l.   Protective sensation intact 5/5 intact bilaterally with 10g monofilament b/l. Vibratory sensation intact b/l. Proprioception intact bilaterally. Babinski reflex negative b/l. Clonus negative b/l.  Dermatological:  Pedal skin with normal turgor, texture and tone bilaterally. No open wounds bilaterally. No interdigital macerations bilaterally. Toenails 1-5 b/l elongated, discolored, dystrophic, thickened, crumbly with subungual debris and tenderness to dorsal palpation. Hyperkeratotic lesion(s) R 2nd toe.  No erythema, no edema, no drainage, no flocculence.  Musculoskeletal:  Normal muscle strength 5/5 to all lower extremity muscle groups bilaterally. No pain crepitus or joint limitation noted with ROM  b/l. Hammertoes noted to the R 2nd toe.  Assessment and Plan:  1. Pain due to onychomycosis of toenails of both feet   2. Xerosis cutis    -Examined patient. -Toenails 1-5 b/l were debrided in length and girth with sterile nail nippers and dremel without iatrogenic bleeding.  -For xerosis, patient was advised to apply Aquaphor Ointment to both feet once daily. -Corn(s) R 2nd toe pared utilizing sterile scalpel blade without complication or incident. Total number debrided=1. -Patient to continue soft, supportive shoe gear daily. -Patient to report any pedal injuries to medical professional immediately. -Patient/POA to call should there be question/concern in the interim.  Return in about 9 weeks (around 02/12/2020) for callus trim.  Marzetta Board, DPM

## 2019-12-20 DIAGNOSIS — G894 Chronic pain syndrome: Secondary | ICD-10-CM | POA: Diagnosis not present

## 2019-12-20 DIAGNOSIS — F329 Major depressive disorder, single episode, unspecified: Secondary | ICD-10-CM | POA: Diagnosis not present

## 2019-12-20 DIAGNOSIS — F439 Reaction to severe stress, unspecified: Secondary | ICD-10-CM | POA: Diagnosis not present

## 2019-12-20 DIAGNOSIS — E119 Type 2 diabetes mellitus without complications: Secondary | ICD-10-CM | POA: Diagnosis not present

## 2019-12-20 DIAGNOSIS — M130 Polyarthritis, unspecified: Secondary | ICD-10-CM | POA: Diagnosis not present

## 2019-12-20 DIAGNOSIS — M65341 Trigger finger, right ring finger: Secondary | ICD-10-CM | POA: Diagnosis not present

## 2019-12-20 DIAGNOSIS — Z9884 Bariatric surgery status: Secondary | ICD-10-CM | POA: Diagnosis not present

## 2019-12-20 DIAGNOSIS — M0609 Rheumatoid arthritis without rheumatoid factor, multiple sites: Secondary | ICD-10-CM | POA: Diagnosis not present

## 2019-12-20 DIAGNOSIS — Z79899 Other long term (current) drug therapy: Secondary | ICD-10-CM | POA: Diagnosis not present

## 2019-12-20 DIAGNOSIS — M2550 Pain in unspecified joint: Secondary | ICD-10-CM | POA: Diagnosis not present

## 2019-12-24 ENCOUNTER — Other Ambulatory Visit (HOSPITAL_BASED_OUTPATIENT_CLINIC_OR_DEPARTMENT_OTHER): Payer: Self-pay | Admitting: Orthopedic Surgery

## 2019-12-24 DIAGNOSIS — M25561 Pain in right knee: Secondary | ICD-10-CM | POA: Diagnosis not present

## 2019-12-24 MED FILL — CYCLOBENZAPRINE HCL 10 MG T: 10 | 30 days supply | Qty: 30 | Fill #0

## 2019-12-24 MED FILL — METHOCARBAMOL 500 MG TABS: 500 | 30 days supply | Qty: 90 | Fill #0

## 2019-12-27 ENCOUNTER — Ambulatory Visit (INDEPENDENT_AMBULATORY_CARE_PROVIDER_SITE_OTHER): Payer: 59 | Admitting: Psychology

## 2019-12-27 DIAGNOSIS — F4323 Adjustment disorder with mixed anxiety and depressed mood: Secondary | ICD-10-CM

## 2019-12-27 MED FILL — OLMESARTAN MEDOXOMIL 5 MG T: 5 | 90 days supply | Qty: 90 | Fill #3

## 2019-12-27 MED FILL — TEMAZEPAM 30 MG CAPSULE: 30 | 90 days supply | Qty: 90 | Fill #1

## 2019-12-27 MED FILL — PREGABALIN 75 MG CAPS: 75 | 30 days supply | Qty: 30 | Fill #0

## 2020-01-02 ENCOUNTER — Encounter: Payer: Self-pay | Admitting: Family Medicine

## 2020-01-07 ENCOUNTER — Encounter: Payer: Self-pay | Admitting: Family Medicine

## 2020-01-07 ENCOUNTER — Other Ambulatory Visit: Payer: Self-pay | Admitting: Family Medicine

## 2020-01-07 ENCOUNTER — Other Ambulatory Visit: Payer: Self-pay

## 2020-01-07 ENCOUNTER — Ambulatory Visit: Payer: 59 | Admitting: Family Medicine

## 2020-01-07 VITALS — BP 120/60 | HR 73 | Temp 97.9°F | Resp 18 | Ht 71.0 in | Wt 184.4 lb

## 2020-01-07 DIAGNOSIS — E118 Type 2 diabetes mellitus with unspecified complications: Secondary | ICD-10-CM

## 2020-01-07 DIAGNOSIS — I10 Essential (primary) hypertension: Secondary | ICD-10-CM | POA: Diagnosis not present

## 2020-01-07 DIAGNOSIS — E119 Type 2 diabetes mellitus without complications: Secondary | ICD-10-CM | POA: Diagnosis not present

## 2020-01-07 DIAGNOSIS — R011 Cardiac murmur, unspecified: Secondary | ICD-10-CM | POA: Insufficient documentation

## 2020-01-07 DIAGNOSIS — E785 Hyperlipidemia, unspecified: Secondary | ICD-10-CM

## 2020-01-07 DIAGNOSIS — M0579 Rheumatoid arthritis with rheumatoid factor of multiple sites without organ or systems involvement: Secondary | ICD-10-CM

## 2020-01-07 LAB — COMPREHENSIVE METABOLIC PANEL
ALT: 21 U/L (ref 0–35)
AST: 18 U/L (ref 0–37)
Albumin: 3.7 g/dL (ref 3.5–5.2)
Alkaline Phosphatase: 86 U/L (ref 39–117)
BUN: 12 mg/dL (ref 6–23)
CO2: 31 mEq/L (ref 19–32)
Calcium: 9.2 mg/dL (ref 8.4–10.5)
Chloride: 104 mEq/L (ref 96–112)
Creatinine, Ser: 0.67 mg/dL (ref 0.40–1.20)
GFR: 88.89 mL/min (ref >60.00)
Glucose, Bld: 94 mg/dL (ref 70–99)
Potassium: 4.2 mEq/L (ref 3.5–5.1)
Sodium: 140 mEq/L (ref 135–145)
Total Bilirubin: 0.5 mg/dL (ref 0.2–1.2)
Total Protein: 6.3 g/dL (ref 6.0–8.3)

## 2020-01-07 LAB — LIPID PANEL
Cholesterol: 92 mg/dL (ref 0–200)
HDL: 45.3 mg/dL (ref >39.00)
LDL Cholesterol: 35 mg/dL (ref 0–99)
NonHDL: 46.32
Total CHOL/HDL Ratio: 2
Triglycerides: 55 mg/dL (ref 0.0–149.0)
VLDL: 11 mg/dL (ref 0.0–40.0)

## 2020-01-07 LAB — HEMOGLOBIN A1C: Hgb A1c MFr Bld: 6.3 % (ref 4.6–6.5)

## 2020-01-07 MED ORDER — ROSUVASTATIN CALCIUM 20 MG PO TABS: ORAL_TABLET | ORAL | 2 refills | Status: DC

## 2020-01-07 MED FILL — DULoxetine HCL 30 MG CPEP: 30 | 90 days supply | Qty: 180 | Fill #0

## 2020-01-07 MED FILL — ROSUVASTATIN CALCIUM 20 MG: 20 | 90 days supply | Qty: 90 | Fill #2

## 2020-01-07 NOTE — Assessment & Plan Note (Signed)
Encouraged heart healthy diet, increase exercise, avoid trans fats, consider a krill oil cap daily 

## 2020-01-07 NOTE — Assessment & Plan Note (Signed)
hgba1c to be checked, minimize simple carbs. Increase exercise as tolerated. Continue current meds  

## 2020-01-07 NOTE — Progress Notes (Signed)
Patient ID: [name], female    DOB: 06/27/1956  Age: 64 y.o. MRN: [id number]    Subjective:  Subjective  HPI  [name] presents for f/u Rheum arthritis -- she hurts all over all the time   She is applying for long term disability  She saw Dr Gladstone Lighter for her knee pain   She is struggling with the pain and is frustrated with the disability process.  RA tx has not been helping  She will be in a trial [facility] diabetic neuropathy at [facility] [place].    Review of Systems  Constitutional: Negative for activity change, appetite change and fatigue.  HENT: Negative for hearing loss, congestion, tinnitus and ear discharge.  dentist q68m Eyes: Negative for visual disturbance (see optho q1y -- vision corrected to 20/20 with glasses).  Respiratory: Negative for cough, chest tightness and shortness of breath.   Cardiovascular: Negative for chest pain, palpitations and leg swelling.  Gastrointestinal: Negative for abdominal pain, diarrhea, constipation and abdominal distention.  Genitourinary: Negative for urgency, frequency, decreased urine volume and difficulty urinating.  Musculoskeletal: Negative for back pain, + arthralgias  Skin: Negative for color change, pallor and rash.  Neurological: Negative for dizziness, light-headedness, numbness and headaches.  Hematological: Negative for adenopathy. Does not bruise/bleed easily.  Psychiatric/Behavioral: Negative for suicidal ideas, confusion, sleep disturbance, self-injury, dysphoric mood, decreased concentration and agitation.      History Past Medical History:  Diagnosis Date  . Allergic rhinitis   . Anxiety   . Asthma    hx bronchial asthma at times with upper resp infection  . Depression   . Diabetes mellitus without complication (HCC)    diet controlled no meds  . Fibromyalgia   . GERD (gastroesophageal reflux disease)   . Headache(784.0)    migraine and cluster headaches  . Hyperlipemia   .  Hypertension   . IBS (irritable bowel syndrome)   . Menopause   . Neuromuscular disorder ([place]) 2009   hx of fibromyalgia, polyarthralsia (surgery induced)  . Stress incontinence    at times  . Tibial fracture 10/14/2016   evulsion periostial right    She has a past surgical history that includes Cesarean section ([place]); Cervical laminectomy (2005 & 2009); Foot surgery (2005); right knee (1981); Nasal sinus surgery ([place]); Laparoscopic gastric banding (12/30/09); Esophagogastroduodenoscopy (06/29/2011); Hysteroscopy (1999); laparoscopy (09/24/2011); Gastric banding port revision (09/24/2011); Tonsillectomy (1971); Cholecystectomy (1986); Tubal ligation (1993); right knee arthroscopy and arthrotomy ([id number]); Cervical conization w/bx (june 1990); laparoscopy (07/03/2012); Laparoscopic revision of gastric band (07/03/2012); Mesh applied to lap port (07/03/2012); Dilation and curettage of uterus ( april-1989); Laparoscopic repair and removal of gastric band (N/A, 07/02/2013); Laparoscopic gastric sleeve resection (N/A, 07/02/2013); Abdominal hysterectomy (12/1999); Gastric Roux-En-Y (N/A, 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   Her family history includes Atrial fibrillation in her mother; Breast cancer in her mother; COPD in an other family member; Cancer in her mother; Diabetes in her father and mother; Heart attack in her father; Heart disease in an other family member; Hypertension in her father and mother; Stroke in her father and mother; Thyroid disease in an other family member.She reports that she has never smoked. She has never used smokeless tobacco. She reports that she does not drink alcohol and does not use drugs.  Current Outpatient Medications on File Prior to Visit  Medication Sig Dispense Refill  . acetaminophen (TYLENOL 8 HOUR ARTHRITIS PAIN) 650 MG CR tablet Take 650  mg by mouth 2 (two) times daily as needed for pain.    Marland Kitchen ALPRAZolam (XANAX) 0.25 MG  tablet Take 1 tablet (0.25 mg total) by mouth 2 (two) times daily as needed for anxiety. 20 tablet 0  . Ca Phosphate-Cholecalciferol (CALTRATE GUMMY BITES PO) Take 2 each 2 (two) times daily by mouth.     . cyclobenzaprine (FLEXERIL) 10 MG tablet Take 1 tablet (10 mg total) by mouth 3 (three) times daily as needed. for muscle spams 30 tablet 1  . diclofenac Sodium (VOLTAREN) 1 % GEL Apply 4 g topically 4 (four) times daily. 150 g 1  . DULoxetine (CYMBALTA) 30 MG capsule Take 2 capsules (60 mg total) by mouth daily. 180 capsule 3  . estradiol (VIVELLE-DOT) 0.1 MG/24HR Place 1 patch (0.1 mg total) onto the skin 2 (two) times a week. Monday and Thursday (Patient taking differently: Place 1 patch onto the skin 2 (two) times a week. Mondays and Thursdays) 8 patch 11  . folic acid (FOLVITE) 1 MG tablet Take 1 mg by mouth 3 (three) times daily as needed.     . furosemide (LASIX) 20 MG tablet TAKE 1 TABLET BY MOUTH EVERY MORNING 90 tablet 1  . glucose blood test strip Test blood sugar once daily. Dx Code: E11.9 100 each 12  . hydroxychloroquine (PLAQUENIL) 200 MG tablet 200 mg 2 (two) times daily.     Marland Kitchen leflunomide (ARAVA) 20 MG tablet Take 20 mg by mouth daily.    Marland Kitchen loratadine (CLARITIN) 10 MG tablet Take 10 mg by mouth every evening.     . methocarbamol (ROBAXIN) 500 MG tablet Take 1 tablet (500 mg total) by mouth 4 (four) times daily. 45 tablet 1  . NONFORMULARY OR COMPOUNDED ITEM bp cuff  Dx hypertension 1 each 0  . nystatin (MYCOSTATIN/NYSTOP) powder Apply 1 application topically 3 (three) times daily. 15 g 0  . olmesartan (BENICAR) 5 MG tablet Take 1 tablet (5 mg total) by mouth daily. 90 tablet 3  . Pediatric Multivit-Minerals-C (ONE-A-DAY SCOOBY-DOO GUMMIES PO) Take 1 tablet 2 (two) times daily by mouth.     . potassium chloride (KLOR-CON) 10 MEQ tablet TAKE 1 TABLET (10 MEQ TOTAL) BY MOUTH DAILY. 90 tablet 1  . pregabalin (LYRICA) 75 MG capsule Take 75 mg by mouth daily.    Marland Kitchen PREMARIN vaginal  cream Apply 1 application topically 2 (two) times a week. Mondays & Thursdays.  1  . temazepam (RESTORIL) 30 MG capsule TAKE 1 CAPSULE (30 MG TOTAL) BY MOUTH AT BEDTIME AS NEEDED FOR SLEEP. 90 capsule 3  . thiamine (VITAMIN B-1) 100 MG tablet Take 100 mg daily by mouth.     No current facility-administered medications on file prior to visit.     Objective:  Objective  Physical Exam Vitals and nursing note reviewed.  Constitutional:      Appearance: She is well-developed.  HENT:     Head: Normocephalic and atraumatic.  Eyes:     Conjunctiva/sclera: Conjunctivae normal.  Neck:     Thyroid: No thyromegaly.     Vascular: No carotid bruit or JVD.  Cardiovascular:     Rate and Rhythm: Normal rate and regular rhythm.     Heart sounds: Murmur heard.   Pulmonary:     Effort: Pulmonary effort is normal. No respiratory distress.     Breath sounds: Normal breath sounds. No wheezing or rales.  Chest:     Chest wall: No tenderness.  Musculoskeletal:     Cervical  back: Normal range of motion and neck supple.  Neurological:     Mental Status: She is alert and oriented to person, place, and time.    BP 120/60 (BP Location: Right Arm, Patient Position: Sitting, Cuff Size: Normal)   Pulse 73   Temp 97.9 F (36.6 C) (Temporal)   Resp 18   Ht 5\' 11"  (1.803 m)   Wt 184 lb 6.4 oz (83.6 kg)   SpO2 95%   BMI 25.72 kg/m  Wt Readings from Last 3 Encounters:  01/07/20 184 lb 6.4 oz (83.6 kg)  12/03/19 183 lb (83 kg)  09/25/19 198 lb 9.6 oz (90.1 kg)     Lab Results  Component Value Date   WBC 8.9 08/24/2018   HGB 13.8 08/24/2018   HCT 42.4 08/24/2018   PLT 259.0 08/24/2018   GLUCOSE 97 08/02/2019   CHOL 117 08/02/2019   TRIG 50.0 08/02/2019   HDL 71.60 08/02/2019   LDLCALC 35 08/02/2019   ALT 37 (H) 08/02/2019   AST 24 08/02/2019   NA 139 08/02/2019   K 4.1 08/02/2019   CL 104 08/02/2019   CREATININE 0.72 08/02/2019   BUN 9 08/02/2019   CO2 31 08/02/2019   TSH 1.78  01/26/2019   HGBA1C 6.3 08/02/2019   MICROALBUR 0.8 08/02/2019    MM 3D SCREEN BREAST BILATERAL  Result Date: 07/18/2019 CLINICAL DATA:  Screening. EXAM: DIGITAL SCREENING BILATERAL MAMMOGRAM WITH TOMO AND CAD COMPARISON:  Previous exam(s). ACR Breast Density Category b: There are scattered areas of fibroglandular density. FINDINGS: There are no findings suspicious for malignancy. Images were processed with CAD. IMPRESSION: No mammographic evidence of malignancy. A result letter of this screening mammogram will be mailed directly to the patient. RECOMMENDATION: Screening mammogram in one year. (Code:SM-B-01Y) BI-RADS CATEGORY  1: Negative. Electronically Signed   By: Audie Pinto M.D.   On: 07/18/2019 16:59     Assessment & Plan:  Plan  I am having Coolidge Oceguera maintain her loratadine, Ca Phosphate-Cholecalciferol (CALTRATE GUMMY BITES PO), Pediatric Multivit-Minerals-C (ONE-A-DAY SCOOBY-DOO GUMMIES PO), estradiol, Premarin, thiamine, acetaminophen, glucose blood, cyclobenzaprine, folic acid, hydroxychloroquine, ALPRAZolam, DULoxetine, furosemide, methocarbamol, olmesartan, potassium chloride, leflunomide, diclofenac Sodium, NONFORMULARY OR COMPOUNDED ITEM, temazepam, nystatin, pregabalin, and rosuvastatin.  Meds ordered this encounter  Medications  . rosuvastatin (CRESTOR) 20 MG tablet    Sig: 1 po qd    Dispense:  90 tablet    Refill:  2    Problem List Items Addressed This Visit      Unprioritized   Controlled type 2 diabetes mellitus with complication, without long-term current use of insulin ([place])    hgba1c to be checked, minimize simple carbs. Increase exercise as tolerated. Continue current meds       Relevant Medications   rosuvastatin (CRESTOR) 20 MG tablet   Essential hypertension - Primary    Well controlled, no changes to meds. Encouraged heart healthy diet such as the DASH diet and exercise as tolerated.       Relevant Medications   rosuvastatin (CRESTOR) 20  MG tablet   Other Relevant Orders   Lipid panel   Hemoglobin A1c   Comprehensive metabolic panel   Hyperlipidemia    Encouraged heart healthy diet, increase exercise, avoid trans fats, consider a krill oil cap daily      Relevant Medications   rosuvastatin (CRESTOR) 20 MG tablet   Other Relevant Orders   Lipid panel   Hemoglobin A1c   Comprehensive metabolic panel   Hyperlipidemia LDL goal <  100    Encouraged heart healthy diet, increase exercise, avoid trans fats, consider a krill oil cap daily      Relevant Medications   rosuvastatin (CRESTOR) 20 MG tablet   Rheumatoid arthritis involving multiple sites with positive rheumatoid factor ([place])    Per rheum Pt is going to call to see the physician  fmla paperwork filled out       Other Visit Diagnoses    Diet-controlled diabetes mellitus ([place])       Relevant Medications   rosuvastatin (CRESTOR) 20 MG tablet   Other Relevant Orders   Lipid panel   Hemoglobin A1c   Comprehensive metabolic panel   Murmur       Relevant Orders   ECHOCARDIOGRAM COMPLETE      Follow-up: Return in about 6 months (around 07/08/2020), or if symptoms worsen or fail to improve, for hypertension, hyperlipidemia, diabetes II.  [name], DO

## 2020-01-07 NOTE — Patient Instructions (Signed)
Rheumatoid Arthritis Rheumatoid arthritis (RA) is a long-term (chronic) disease that causes inflammation in your joints. RA may start slowly. It most often affects the small joints of the hands and feet. Usually, the same joints are affected on both sides of your body. Inflammation from RA can also affect other parts of your body, including your heart, eyes, or lungs. There is no cure for RA, but medicines can help your symptoms and halt or slow down the progression of the disease. What are the causes? RA is an autoimmune disease. When you have an autoimmune disease, your body's defense system (immune system) mistakenly attacks healthy body tissues. The exact cause of RA is not known. What increases the risk? You are more likely to develop this condition if you:  Are a woman.  Have a family history of RA or other autoimmune diseases.  Have a history of smoking.  Are obese.  Have been exposed to pollutants or chemicals. What are the signs or symptoms? The first symptom of this condition may be morning stiffness that lasts longer than 30 minutes.  Symptoms usually start gradually. They are often worse in the morning. As RA progresses, symptoms may include:  Pain, stiffness, swelling, warmth, and tenderness in joints on both sides of your body.  Loss of energy.  Loss of appetite.  Weight loss.  Low-grade fever.  Dry eyes and dry mouth.  Firm lumps (rheumatoid nodules) that grow beneath your skin in areas such as your forearm bones near your elbows and on your hands.  Changes in the appearance of joints (deformity) and loss of joint function. Symptoms of this condition vary from person to person.  Symptoms of RA often come and go.  Sometimes, symptoms get worse for a period of time. These are called flares. How is this diagnosed? This condition is diagnosed based on your symptoms, medical history, and physical exam.  You may have X-rays or an MRI to check for the type of  joint changes that are caused by RA. You may also have blood tests to look for:  Proteins (antibodies) that your immune system may make if you have RA. These include rheumatoid factor (RF) and anti-CCP. ? When blood tests show these proteins, you are said to have "seropositive RA." ? When blood tests do not show these proteins, you may have "seronegative RA."  Inflammation in your blood.  A low number of red blood cells (anemia). How is this treated? The goals of treatment are to relieve pain, reduce inflammation, and slow down or stop joint damage and disability. Treatment may include:  Lifestyle changes. It is important to rest as needed, eat a healthy diet, and exercise.  Medicines. Your health care provider may adjust your medicines every 3 months until treatment goals are reached. Common medicines include: ? Pain relievers (analgesics). ? Corticosteroids and NSAIDs to reduce inflammation. ? Disease-modifying antirheumatic drugs (DMARDs) to try to slow the course of the disease. ? Biologic response modifiers to reduce inflammation and damage.  Physical therapy and occupational therapy.  Surgery, if you have severe joint damage. Joint replacement or fusing of joints may be needed. Your health care provider will work with you to identify the best treatment option for you based on assessment of the overall disease activity in your body. Follow these instructions at home: Activity  Return to your normal activities as told by your health care provider. Ask your health care provider what activities are safe for you.  Rest when you are having a   flare.  Start an exercise program as told by your health care provider. General instructions  Keep all follow-up visits as told by your health care provider. This is important.  Take over-the-counter and prescription medicines only as told by your health care provider. Where to find more information  American College of Rheumatology:  www.rheumatology.org  Arthritis Foundation: www.arthritis.org Contact a health care provider if:  You have a flare-up of RA symptoms.  You have a fever.  You have side effects from your medicines. Get help right away if:  You have chest pain.  You have trouble breathing.  You quickly develop a hot, painful joint that is more severe than your usual joint aches. Summary  Rheumatoid arthritis (RA) is a long-term (chronic) disease that causes inflammation in your joints.  RA is an autoimmune disease.  The goals of treatment are to relieve pain, reduce inflammation, and slow down or stop joint damage and disability. This information is not intended to replace advice given to you by your health care provider. Make sure you discuss any questions you have with your health care provider. Document Revised: 01/09/2019 Document Reviewed: 02/28/2018 Elsevier Patient Education  2020 Elsevier Inc.  

## 2020-01-07 NOTE — Assessment & Plan Note (Signed)
Well controlled, no changes to meds. Encouraged heart healthy diet such as the DASH diet and exercise as tolerated.  °

## 2020-01-07 NOTE — Telephone Encounter (Signed)
Could be either-- echo will determine

## 2020-01-07 NOTE — Assessment & Plan Note (Signed)
Per rheum Pt is going to call to see the physician  fmla paperwork filled out

## 2020-01-08 ENCOUNTER — Telehealth: Payer: Self-pay | Admitting: Family Medicine

## 2020-01-08 NOTE — Telephone Encounter (Signed)
Pt came in office regarding about needing new referral to make an appt with Rheumathologist Dr Bo Merino at  [place], [place] [phone], pt states went to another Rheumathologist at [facility] (Dr Arne Cleveland), pt mentioned that at [facility] they are not wanting to schedule her (pt states they are given her the run around), pt saw Dr Etter Sjogren yesterday and spoke about needing to see Rheumatologist ASAP since pt needs it for  Long Term Disability. Please advise. If any question call pt at [phone].

## 2020-01-09 ENCOUNTER — Other Ambulatory Visit: Payer: Self-pay | Admitting: Family Medicine

## 2020-01-09 DIAGNOSIS — M069 Rheumatoid arthritis, unspecified: Secondary | ICD-10-CM

## 2020-01-09 MED FILL — LEFLUNOMIDE 20 MG TABLET: 20 | 30 days supply | Qty: 30 | Fill #0

## 2020-01-09 NOTE — Telephone Encounter (Signed)
Please advise 

## 2020-01-10 ENCOUNTER — Ambulatory Visit (INDEPENDENT_AMBULATORY_CARE_PROVIDER_SITE_OTHER): Payer: 59 | Admitting: Psychology

## 2020-01-10 DIAGNOSIS — F4323 Adjustment disorder with mixed anxiety and depressed mood: Secondary | ICD-10-CM

## 2020-01-11 ENCOUNTER — Encounter: Payer: Self-pay | Admitting: Family Medicine

## 2020-01-11 ENCOUNTER — Telehealth: Payer: Self-pay | Admitting: Family Medicine

## 2020-01-11 ENCOUNTER — Other Ambulatory Visit: Payer: Self-pay | Admitting: Family Medicine

## 2020-01-11 DIAGNOSIS — R011 Cardiac murmur, unspecified: Secondary | ICD-10-CM

## 2020-01-11 DIAGNOSIS — M069 Rheumatoid arthritis, unspecified: Secondary | ICD-10-CM

## 2020-01-11 NOTE — Telephone Encounter (Signed)
I put referral in for cardiologist

## 2020-01-11 NOTE — Telephone Encounter (Signed)
Patient called regarding echo , the current one she was sent to is charging too much money , patient asked for a [facility] . Please advise .please call patient regarding Infusion as well.

## 2020-01-11 NOTE — Telephone Encounter (Signed)
Please advise 

## 2020-01-15 ENCOUNTER — Ambulatory Visit (HOSPITAL_COMMUNITY): Payer: 59

## 2020-01-15 ENCOUNTER — Encounter (HOSPITAL_COMMUNITY): Payer: Self-pay

## 2020-01-16 NOTE — Telephone Encounter (Signed)
Patient is calling in regards to referral to :Bo Merino   Patient is calling to check status.

## 2020-01-18 ENCOUNTER — Ambulatory Visit: Payer: 59 | Admitting: Podiatry

## 2020-01-18 ENCOUNTER — Encounter: Payer: Self-pay | Admitting: Podiatry

## 2020-01-18 ENCOUNTER — Other Ambulatory Visit: Payer: Self-pay

## 2020-01-18 VITALS — Temp 96.2°F

## 2020-01-18 DIAGNOSIS — L84 Corns and callosities: Secondary | ICD-10-CM

## 2020-01-18 DIAGNOSIS — M0579 Rheumatoid arthritis with rheumatoid factor of multiple sites without organ or systems involvement: Secondary | ICD-10-CM | POA: Diagnosis not present

## 2020-01-18 DIAGNOSIS — M2011 Hallux valgus (acquired), right foot: Secondary | ICD-10-CM

## 2020-01-18 DIAGNOSIS — E119 Type 2 diabetes mellitus without complications: Secondary | ICD-10-CM

## 2020-01-18 DIAGNOSIS — M2012 Hallux valgus (acquired), left foot: Secondary | ICD-10-CM

## 2020-01-18 DIAGNOSIS — M2042 Other hammer toe(s) (acquired), left foot: Secondary | ICD-10-CM

## 2020-01-18 DIAGNOSIS — M2041 Other hammer toe(s) (acquired), right foot: Secondary | ICD-10-CM

## 2020-01-18 NOTE — Progress Notes (Signed)
Subjective: NYONNA HARGROVE presents today painful corn(s) distal tips of b/l 2nd toes and painful thick toenails that are difficult to trim. Pain interferes with ambulation. Aggravating factors include wearing enclosed shoe gear. Pain is relieved with periodic professional debridement.   Mrs. Escareno is happy to report she has been applying Aquaphor Ointment to her feet twice daily and notices significant improvement in cracking of her skin.   She also states she is now converting from short-term to long-term disability and her medical records are needed for her claim.   Patient relates she got a pedicure about one month ago.  [name], DO is patient's PCP. Last visit was: 01/07/2020.  Past Medical History:  Diagnosis Date  . Allergic rhinitis   . Anxiety   . Asthma    hx bronchial asthma at times with upper resp infection  . Depression   . Diabetes mellitus without complication (HCC)    diet controlled no meds  . Fibromyalgia   . GERD (gastroesophageal reflux disease)   . Headache(784.0)    migraine and cluster headaches  . Hyperlipemia   . Hypertension   . IBS (irritable bowel syndrome)   . Menopause   . Neuromuscular disorder ([place]) 2009   hx of fibromyalgia, polyarthralsia (surgery induced)  . Stress incontinence    at times  . Tibial fracture 10/14/2016   evulsion periostial right     Patient Active Problem List   Diagnosis Date Noted  . Rheumatoid arthritis involving multiple sites with positive rheumatoid factor ([place]) 08/02/2019  . Primary osteoarthritis of both knees 08/02/2019  . PTSD (post-traumatic stress disorder) 02/08/2019  . Dog bite of arm, right, initial encounter 10/05/2018  . Hyperlipidemia associated with type 2 diabetes mellitus ([place]) 08/24/2018  . Symptomatic abdominal panniculus 12/12/2017  . Hyperlipidemia 10/06/2017  . Cervical radiculopathy 10/06/2017  . 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 Keratosis 12/04/2014  . Onychocryptosis  08/21/2014  . Onychomycosis 05/21/2014  . Fissured skin 01/22/2014  . Fissure in skin of foot 12/18/2013  . Porokeratosis 12/18/2013  . Pain in lower limb 11/21/2013  . Ingrown nail 07/20/2013  . 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 Obesity (BMI 30-39.9) 06/25/2013  . GERD (gastroesophageal reflux disease) 08/20/2011  . Lapband APL + [facility] repair June 2011-Major revision Dec 2013 06/29/2011  . ABDOMINAL PAIN, ACUTE 08/01/2010  . VITAMIN B12 DEFICIENCY 04/01/2010  . BARIATRIC SURGERY STATUS 01/29/2010  . ONYCHOMYCOSIS, BILATERAL 06/30/2009  . STRESS INCONTINENCE 06/10/2009  . ACUTE PHARYNGITIS 04/29/2009  . COUGH 04/27/2009  . BRONCHITIS, ACUTE 04/16/2009  . NAUSEA 04/08/2009  . DIARRHEA 04/08/2009  . MORBID OBESITY 03/03/2009  . SKIN RASH 11/18/2008  . UNSPECIFIED VITAMIN D DEFICIENCY 07/15/2008  . OTHER SPECIFIED ANEMIAS 07/15/2008  . Pain in joint, multiple sites 06/13/2008  . Myalgia and myositis, unspecified 06/13/2008  . BACK PAIN, LUMBAR 02/14/2008  . ACUTE SINUSITIS, UNSPECIFIED 09/01/2007  . CELLULITIS 08/12/2007  . Depression with anxiety 06/09/2007  . DEPRESSION 06/09/2007  . Controlled type 2 diabetes mellitus with complication, without long-term current use of insulin ([place]) 01/04/2007  . Hyperlipidemia LDL goal <100 01/04/2007  . Essential hypertension 01/04/2007  . ALLERGIC RHINITIS 01/04/2007  . HEADACHE 01/04/2007    Current Outpatient Medications on File Prior to Visit  Medication Sig Dispense Refill  . acetaminophen (TYLENOL 8 HOUR ARTHRITIS PAIN) 650 MG CR tablet Take 650 mg by mouth 2 (two) times daily as needed for pain.    Marland Kitchen ALPRAZolam (  XANAX) 0.25 MG tablet Take 1 tablet (0.25 mg total) by mouth 2 (two) times daily as needed for anxiety. 20 tablet 0  . Ca Phosphate-Cholecalciferol (CALTRATE GUMMY BITES PO) Take 2 each 2 (two) times daily by mouth.     . cyclobenzaprine (FLEXERIL) 10 MG tablet Take 1 tablet (10 mg total)  by mouth 3 (three) times daily as needed. for muscle spams 30 tablet 1  . diclofenac Sodium (VOLTAREN) 1 % GEL Apply 4 g topically 4 (four) times daily. 150 g 1  . DULoxetine (CYMBALTA) 30 MG capsule Take 2 capsules (60 mg total) by mouth daily. 180 capsule 3  . estradiol (VIVELLE-DOT) 0.1 MG/24HR Place 1 patch (0.1 mg total) onto the skin 2 (two) times a week. Monday and Thursday (Patient taking differently: Place 1 patch onto the skin 2 (two) times a week. Mondays and Thursdays) 8 patch 11  . folic acid (FOLVITE) 1 MG tablet Take 1 mg by mouth 3 (three) times daily as needed.     . furosemide (LASIX) 20 MG tablet TAKE 1 TABLET BY MOUTH EVERY MORNING 90 tablet 1  . glucose blood test strip Test blood sugar once daily. Dx Code: E11.9 100 each 12  . hydroxychloroquine (PLAQUENIL) 200 MG tablet 200 mg 2 (two) times daily.     Marland Kitchen leflunomide (ARAVA) 20 MG tablet Take 20 mg by mouth daily.    Marland Kitchen loratadine (CLARITIN) 10 MG tablet Take 10 mg by mouth every evening.     . methocarbamol (ROBAXIN) 500 MG tablet Take 1 tablet (500 mg total) by mouth 4 (four) times daily. 45 tablet 1  . NONFORMULARY OR COMPOUNDED ITEM bp cuff  Dx hypertension 1 each 0  . nystatin (MYCOSTATIN/NYSTOP) powder Apply 1 application topically 3 (three) times daily. 15 g 0  . olmesartan (BENICAR) 5 MG tablet Take 1 tablet (5 mg total) by mouth daily. 90 tablet 3  . Pediatric Multivit-Minerals-C (ONE-A-DAY SCOOBY-DOO GUMMIES PO) Take 1 tablet 2 (two) times daily by mouth.     . potassium chloride (KLOR-CON) 10 MEQ tablet TAKE 1 TABLET (10 MEQ TOTAL) BY MOUTH DAILY. 90 tablet 1  . pregabalin (LYRICA) 75 MG capsule Take 75 mg by mouth daily.    Marland Kitchen PREMARIN vaginal cream Apply 1 application topically 2 (two) times a week. Mondays & Thursdays.  1  . rosuvastatin (CRESTOR) 20 MG tablet 1 po qd 90 tablet 2  . temazepam (RESTORIL) 30 MG capsule TAKE 1 CAPSULE (30 MG TOTAL) BY MOUTH AT BEDTIME AS NEEDED FOR SLEEP. 90 capsule 3  . thiamine  (VITAMIN B-1) 100 MG tablet Take 100 mg daily by mouth.     No current facility-administered medications on file prior to visit.     Allergies  Allergen Reactions  . Isoniazid     Severe SOB  . Nitrofurantoin Shortness Of Breath    REACTION: welts  . Hctz [Hydrochlorothiazide]     rash  . Promethazine Hcl [Promethazine Hcl]     IF GIVEN  IV-hallucinations     CAN TAKE PO PHENERGAN  . Tamiflu [Oseltamivir Phosphate] Nausea And Vomiting    "I vomited within 30 minutes of taking it and was told I cannot ever take it again."  . Macrolides And Ketolides     rash  . Morphine Other (See Comments)    severe vomiting  . Percocet [Oxycodone-Acetaminophen]     REACTION: severe itching. Can take by mouth codeine  . Roxicet [Oxycodone-Acetaminophen]     itching  .  Sulfamethoxazole-Trimethoprim     Septra / Bactrim  --REACTION: rash  . Fentanyl Hives and Rash    REACTION: welts    Objective: CHANELLE HODSDON is a pleasant 64 y.o. African American female in NAD. AAO x 3.  Vitals:   01/18/20 0840  Temp: (!) 96.2 F (35.7 C)   Vascular Examination: Capillary refill time to digits immediate b/l. Palpable pedal pulses b/l LE. Pedal hair sparse. Lower extremity skin temperature gradient within normal limits. No pain with calf compression b/l. No edema noted b/l lower extremities.  Dermatological Examination: Pedal skin with normal turgor, texture and tone bilaterally. No open wounds bilaterally. No interdigital macerations bilaterally. Toenails 1-5 b/l well maintained with adequate length. No erythema, no edema, no drainage, no flocculence. Hyperkeratotic lesion(s) L 2nd toe, R 2nd toe and R 4th toe. Corn right 2nd digit has visible subcutaneous hemorrhage. No erythema, no edema, no drainage, no flocculence noted of any lesion. Pedal skin much improved with use of Aquaphor Ointment. No cracks/fissures noted b/l feet on today's visit.  Musculoskeletal: Normal muscle strength 5/5 to all lower  extremity muscle groups bilaterally. No pain crepitus or joint limitation noted with ROM b/l. Hallux valgus with bunion deformity noted b/l lower extremities. Hammertoes noted to the L 2nd toe, L 3rd toe, R 2nd toe, R 3rd toe and R 4th toe. Patient ambulates independent of any assistive aids.          Neurological Examination: Protective sensation intact 5/5 intact bilaterally with 10g monofilament b/l. Vibratory sensation intact b/l. Proprioception intact bilaterally. Babinski reflex negative b/l. Clonus negative b/l.  Last A1c: Hemoglobin A1C Latest Ref Rng & Units 01/07/2020 08/02/2019 01/26/2019  HGBA1C 4.6 - 6.5 % 6.3 6.3 6.3(H)  Some recent data might be hidden    Assessment: 1. Corns   2. Acquired hammertoes of both feet   3. Hallux valgus, acquired, bilateral   4. Rheumatoid arthritis involving multiple sites with positive rheumatoid factor ([place])   5. Controlled type 2 diabetes mellitus without complication, without long-term current use of insulin ([address])    Plan: -Examined patient. -Patient informed of office procedure for requesting her medical records for her disability claim. -No new findings. No new orders. -Continue diabetic foot care principles daily. -Corn(s) L 2nd toe, R 2nd toe and R 4th toe pared utilizing sterile scalpel blade without complication or incident. Total number debrided=3. -Significantly improved xerosis b/l with use of Aquaphor Ointment. Continue daily. -Patient to report any pedal injuries to medical professional immediately. -Patient to continue soft, supportive shoe gear daily. -Patient/POA to call should there be question/concern in the interim.  Return in about 3 months (around 04/19/2020).  Marzetta Board, DPM

## 2020-01-24 ENCOUNTER — Encounter: Payer: Self-pay | Admitting: Family Medicine

## 2020-01-24 NOTE — Telephone Encounter (Signed)
FYI

## 2020-01-24 NOTE — Telephone Encounter (Signed)
If she wanted them to get records from [facility] they have to send there records Each dr sends their own records

## 2020-01-26 MED FILL — PREGABALIN 75 MG CAPS: 75 | 30 days supply | Qty: 30 | Fill #1

## 2020-01-28 ENCOUNTER — Telehealth: Payer: Self-pay

## 2020-01-28 ENCOUNTER — Ambulatory Visit (INDEPENDENT_AMBULATORY_CARE_PROVIDER_SITE_OTHER): Payer: 59 | Admitting: Psychology

## 2020-01-28 DIAGNOSIS — F4323 Adjustment disorder with mixed anxiety and depressed mood: Secondary | ICD-10-CM | POA: Diagnosis not present

## 2020-01-28 NOTE — Telephone Encounter (Signed)
Dr. [facility] called asking to speak with Dr. Carollee Herter regarding patient.  Requesting call back from Dr. Carollee Herter.  CB # [phone].

## 2020-01-28 NOTE — Telephone Encounter (Signed)
See below

## 2020-01-28 NOTE — Telephone Encounter (Signed)
I called and had to leave a message to call back

## 2020-01-29 ENCOUNTER — Other Ambulatory Visit: Payer: Self-pay

## 2020-01-29 ENCOUNTER — Ambulatory Visit: Payer: 59 | Admitting: Podiatry

## 2020-01-29 ENCOUNTER — Ambulatory Visit: Payer: Self-pay

## 2020-01-29 ENCOUNTER — Encounter: Payer: Self-pay | Admitting: Podiatry

## 2020-01-29 DIAGNOSIS — E11621 Type 2 diabetes mellitus with foot ulcer: Secondary | ICD-10-CM

## 2020-01-29 DIAGNOSIS — M19071 Primary osteoarthritis, right ankle and foot: Secondary | ICD-10-CM | POA: Diagnosis not present

## 2020-01-29 DIAGNOSIS — M791 Myalgia, unspecified site: Secondary | ICD-10-CM | POA: Diagnosis not present

## 2020-01-29 DIAGNOSIS — M1711 Unilateral primary osteoarthritis, right knee: Secondary | ICD-10-CM | POA: Diagnosis not present

## 2020-01-29 DIAGNOSIS — M549 Dorsalgia, unspecified: Secondary | ICD-10-CM | POA: Diagnosis not present

## 2020-01-29 DIAGNOSIS — L97511 Non-pressure chronic ulcer of other part of right foot limited to breakdown of skin: Secondary | ICD-10-CM

## 2020-01-29 DIAGNOSIS — M19042 Primary osteoarthritis, left hand: Secondary | ICD-10-CM | POA: Diagnosis not present

## 2020-01-29 DIAGNOSIS — M19032 Primary osteoarthritis, left wrist: Secondary | ICD-10-CM | POA: Diagnosis not present

## 2020-01-29 DIAGNOSIS — M19072 Primary osteoarthritis, left ankle and foot: Secondary | ICD-10-CM | POA: Diagnosis not present

## 2020-01-29 DIAGNOSIS — M79671 Pain in right foot: Secondary | ICD-10-CM | POA: Diagnosis not present

## 2020-01-29 DIAGNOSIS — M79672 Pain in left foot: Secondary | ICD-10-CM | POA: Diagnosis not present

## 2020-01-29 DIAGNOSIS — E119 Type 2 diabetes mellitus without complications: Secondary | ICD-10-CM | POA: Diagnosis not present

## 2020-01-29 DIAGNOSIS — M79642 Pain in left hand: Secondary | ICD-10-CM | POA: Diagnosis not present

## 2020-01-29 DIAGNOSIS — M0609 Rheumatoid arthritis without rheumatoid factor, multiple sites: Secondary | ICD-10-CM | POA: Diagnosis not present

## 2020-01-29 DIAGNOSIS — M47817 Spondylosis without myelopathy or radiculopathy, lumbosacral region: Secondary | ICD-10-CM | POA: Diagnosis not present

## 2020-01-29 DIAGNOSIS — M1990 Unspecified osteoarthritis, unspecified site: Secondary | ICD-10-CM | POA: Diagnosis not present

## 2020-01-29 DIAGNOSIS — M064 Inflammatory polyarthropathy: Secondary | ICD-10-CM | POA: Diagnosis not present

## 2020-01-29 DIAGNOSIS — M25562 Pain in left knee: Secondary | ICD-10-CM | POA: Diagnosis not present

## 2020-01-29 DIAGNOSIS — M160 Bilateral primary osteoarthritis of hip: Secondary | ICD-10-CM | POA: Diagnosis not present

## 2020-01-29 DIAGNOSIS — M19041 Primary osteoarthritis, right hand: Secondary | ICD-10-CM | POA: Diagnosis not present

## 2020-01-29 DIAGNOSIS — M79641 Pain in right hand: Secondary | ICD-10-CM | POA: Diagnosis not present

## 2020-01-29 DIAGNOSIS — M533 Sacrococcygeal disorders, not elsewhere classified: Secondary | ICD-10-CM | POA: Diagnosis not present

## 2020-01-29 DIAGNOSIS — Z79899 Other long term (current) drug therapy: Secondary | ICD-10-CM | POA: Diagnosis not present

## 2020-01-29 DIAGNOSIS — M1712 Unilateral primary osteoarthritis, left knee: Secondary | ICD-10-CM | POA: Diagnosis not present

## 2020-01-29 DIAGNOSIS — M79643 Pain in unspecified hand: Secondary | ICD-10-CM | POA: Diagnosis not present

## 2020-01-29 DIAGNOSIS — M25561 Pain in right knee: Secondary | ICD-10-CM | POA: Diagnosis not present

## 2020-01-29 DIAGNOSIS — M797 Fibromyalgia: Secondary | ICD-10-CM | POA: Diagnosis not present

## 2020-01-29 DIAGNOSIS — M19031 Primary osteoarthritis, right wrist: Secondary | ICD-10-CM | POA: Diagnosis not present

## 2020-01-29 DIAGNOSIS — M5135 Other intervertebral disc degeneration, thoracolumbar region: Secondary | ICD-10-CM | POA: Diagnosis not present

## 2020-01-29 DIAGNOSIS — M4319 Spondylolisthesis, multiple sites in spine: Secondary | ICD-10-CM | POA: Diagnosis not present

## 2020-01-29 DIAGNOSIS — M47816 Spondylosis without myelopathy or radiculopathy, lumbar region: Secondary | ICD-10-CM | POA: Diagnosis not present

## 2020-01-29 DIAGNOSIS — M25461 Effusion, right knee: Secondary | ICD-10-CM | POA: Diagnosis not present

## 2020-01-29 DIAGNOSIS — M1129 Other chondrocalcinosis, multiple sites: Secondary | ICD-10-CM | POA: Diagnosis not present

## 2020-01-29 MED ORDER — AMOXICILLIN 500 MG PO CAPS: 500.0000 mg | ORAL_CAPSULE | Freq: Three times a day (TID) | ORAL | 0 refills | Status: AC

## 2020-01-29 MED FILL — AMOXICILLIN 500 MG CAPSULE: 500 | 7 days supply | Qty: 21 | Fill #0

## 2020-01-29 MED FILL — HYDROXYCHLOROQUINE 200 MG T: 200 | 30 days supply | Qty: 60 | Fill #1

## 2020-01-29 NOTE — Progress Notes (Addendum)
Subjective:  Patient ID: [name], female    DOB: 23-Sep-1955,  MRN: [id number]  64 y.o. female is seen today for evaluation of R 2nd toe. Pt states toe cracked open and turned red a few days ago. Cannot recall any trauma to digit. She states this has happened one other time in the past that she can recall. She has been soaking in epsom salt and using Neosporin.   She is happy to report she saw her new Rheumatologist who took xrays of her feet, ankles and hands, knees and hips.  Patient The patient is having no constitutional symptoms, denying fever, chills, anorexia, or weight loss.  Past Medical History:  Diagnosis Date  . Allergic rhinitis   . Anxiety   . Asthma    hx bronchial asthma at times with upper resp infection  . Depression   . Diabetes mellitus without complication (HCC)    diet controlled no meds  . Fibromyalgia   . GERD (gastroesophageal reflux disease)   . Headache(784.0)    migraine and cluster headaches  . Hyperlipemia   . Hypertension   . IBS (irritable bowel syndrome)   . Menopause   . Neuromuscular disorder ([place]) 2009   hx of fibromyalgia, polyarthralsia (surgery induced)  . Stress incontinence    at times  . Tibial fracture 10/14/2016   evulsion periostial right     Past Surgical History:  Procedure Laterality Date  . ABDOMINAL HYSTERECTOMY  12/1999   complete  . CARPAL TUNNEL RELEASE Right 12/27/2017   Procedure: CARPAL TUNNEL RELEASE;  Surgeon: Roseanne Kaufman, MD;  Location: [facility];  Service: Orthopedics;  Laterality: Right;  . CERVICAL CONIZATION W/BX  june 1990   dysplasia of cervix/used 5Fu cream for 3 months  . CERVICAL LAMINECTOMY  2005 & 2009   X 2   NO ROM PROBLEMS  . [place]  . DILATION AND CURETTAGE OF UTERUS   [phone]   missed abortion  . ESOPHAGOGASTRODUODENOSCOPY  06/29/2011   Procedure: ESOPHAGOGASTRODUODENOSCOPY (EGD);  Surgeon: Shann Medal, MD;  Location: [facility];  Service: General;  Laterality: N/A;  . FOOT SURGERY  2005   RT HEEL  . GASTRIC BANDING PORT REVISION  09/24/2011   Procedure: GASTRIC BANDING PORT REVISION;  Surgeon: Pedro Earls, MD;  Location: [facility];  Service: General;  Laterality: N/A;  . GASTRIC ROUX-EN-Y N/A 06/06/2017   Procedure: Conversion from [place] to [place];  Surgeon: Johnathan Hausen, MD;  Location: [facility];  Service: General;  Laterality: N/A;  . HYSTEROSCOPY  1999  . LAPAROSCOPIC GASTRIC BANDING  12/30/09  . LAPAROSCOPIC GASTRIC SLEEVE RESECTION N/A 07/02/2013   Procedure: LAPAROSCOPIC GASTRIC SLEEVE RESECTION upper endoscopy;  Surgeon: Pedro Earls, MD;  Location: [facility];  Service: General;  Laterality: N/A;  . LAPAROSCOPIC REPAIR AND REMOVAL OF GASTRIC BAND N/A 07/02/2013   Procedure: LAPAROSCOPIC REMOVAL OF GASTRIC BAND;  Surgeon: Pedro Earls, MD;  [facility];  Service: General;  Laterality: N/A;  . LAPAROSCOPIC REVISION OF GASTRIC BAND  07/03/2012   Procedure: LAPAROSCOPIC REVISION OF GASTRIC BAND;  Surgeon: Pedro Earls, MD;  Location: [facility];  Service: General;  Laterality: N/A;  removal of old lap. band port, replaced with AP standard band  . LAPAROSCOPY  09/24/2011   Procedure: LAPAROSCOPY DIAGNOSTIC;  Surgeon: Pedro Earls, MD;  Location: [facility];  Service: General;  Laterality: N/A;  . LAPAROSCOPY  07/03/2012   Procedure: LAPAROSCOPY DIAGNOSTIC;  Surgeon: Pedro Earls, MD;  Location[facility];  Service: General;  Laterality: N/A;  Exploratory Laparoscopy   . MESH APPLIED TO LAP PORT  07/03/2012   Procedure: MESH APPLIED TO LAP PORT;  Surgeon: Pedro Earls, MD;  Location: [facility];  Service: General;  Laterality: N/A;  . [place]   X 2  . right knee  1981   ARTHROSCOPY AND ARTHROTOMY  . right knee arthroscopy and arthrotomy  12-1979  . TONSILLECTOMY  1971  . TUBAL LIGATION  1993   WITH C -SECTION      Current Outpatient Medications on File Prior to Visit  Medication Sig Dispense Refill  . acetaminophen (TYLENOL 8 HOUR ARTHRITIS PAIN) 650 MG CR tablet Take 650 mg by mouth 2 (two) times daily as needed for pain.    Marland Kitchen ALPRAZolam (XANAX) 0.25 MG tablet Take 1 tablet (0.25 mg total) by mouth 2 (two) times daily as needed for anxiety. 20 tablet 0  . Ca Phosphate-Cholecalciferol (CALTRATE GUMMY BITES PO) Take 2 each 2 (two) times daily by mouth.     . Colchicine 0.6 MG CAPS Take 1 capsule by mouth daily.    . cyclobenzaprine (FLEXERIL) 10 MG tablet Take 1 tablet (10 mg total) by mouth 3 (three) times daily as needed. for muscle spams 30 tablet 1  . diclofenac Sodium (VOLTAREN) 1 % GEL Apply 4 g topically 4 (four) times daily. 150 g 1  . DULoxetine (CYMBALTA) 30 MG capsule Take 2 capsules (60 mg total) by mouth daily. 180 capsule 3  . estradiol (VIVELLE-DOT) 0.1 MG/24HR Place 1 patch (0.1 mg total) onto the skin 2 (two) times a week. Monday and Thursday (Patient taking differently: Place 1 patch onto the skin 2 (two) times a week. Mondays and Thursdays) 8 patch 11  . folic acid (FOLVITE) 1 MG tablet Take 1 mg by mouth 3 (three) times daily as needed.     . furosemide (LASIX) 20 MG tablet TAKE 1 TABLET BY MOUTH EVERY MORNING 90 tablet 1  . glucose blood test strip Test blood sugar once daily. Dx Code: E11.9 100 each 12  . HYDROcodone-acetaminophen (NORCO/VICODIN) 5-325 MG tablet Take 1 tablet by mouth every 4 (four) hours as needed.    . hydroxychloroquine (PLAQUENIL) 200 MG tablet 200 mg 2 (two) times daily.     Marland Kitchen leflunomide (ARAVA) 20 MG tablet Take 20 mg by mouth daily.    Marland Kitchen loratadine (CLARITIN) 10 MG tablet Take 10 mg by mouth every evening.     . methocarbamol (ROBAXIN) 500 MG tablet Take 1 tablet (500 mg total) by mouth 4 (four) times daily. 45 tablet 1  . NONFORMULARY OR COMPOUNDED ITEM bp cuff  Dx hypertension 1 each 0  . nystatin (MYCOSTATIN/NYSTOP) powder Apply 1 application topically 3  (three) times daily. 15 g 0  . olmesartan (BENICAR) 5 MG tablet Take 1 tablet (5 mg total) by mouth daily. 90 tablet 3  . Pediatric Multivit-Minerals-C (ONE-A-DAY SCOOBY-DOO GUMMIES PO) Take 1 tablet 2 (two) times daily by mouth.     . potassium chloride (KLOR-CON) 10 MEQ tablet TAKE 1 TABLET (10 MEQ TOTAL) BY MOUTH DAILY. 90 tablet 1  . pregabalin (LYRICA) 75 MG capsule Take 75 mg by mouth daily.    Marland Kitchen PREMARIN vaginal cream Apply 1 application topically 2 (two) times a week. Mondays & Thursdays.  1  . rosuvastatin (  CRESTOR) 20 MG tablet 1 po qd 90 tablet 2  . temazepam (RESTORIL) 30 MG capsule TAKE 1 CAPSULE (30 MG TOTAL) BY MOUTH AT BEDTIME AS NEEDED FOR SLEEP. 90 capsule 3  . thiamine (VITAMIN B-1) 100 MG tablet Take 100 mg daily by mouth.    . traMADol (ULTRAM) 50 MG tablet tramadol 50 mg tablet  TAKE 1 TABLET BY MOUTH EVERY 8 HOURS AS NEEDED     No current facility-administered medications on file prior to visit.     Allergies  Allergen Reactions  . Isoniazid     Severe SOB  . Nitrofurantoin Shortness Of Breath    REACTION: welts  . Hctz [Hydrochlorothiazide]     rash  . Promethazine Hcl [Promethazine Hcl]     IF GIVEN  IV-hallucinations     CAN TAKE PO PHENERGAN  . Tamiflu [Oseltamivir Phosphate] Nausea And Vomiting    "I vomited within 30 minutes of taking it and was told I cannot ever take it again."  . Macrolides And Ketolides     rash  . Morphine Other (See Comments)    severe vomiting  . Percocet [Oxycodone-Acetaminophen]     REACTION: severe itching. Can take by mouth codeine  . Roxicet [Oxycodone-Acetaminophen]     itching  . Sulfamethoxazole-Trimethoprim     Septra / Bactrim  --REACTION: rash  . Fentanyl Hives and Rash    REACTION: welts     Family History  Problem Relation Age of Onset  . Diabetes Mother   . Stroke Mother   . Breast cancer Mother   . Atrial fibrillation Mother   . Hypertension Mother   . Cancer Mother        breast  . Diabetes Father    . Stroke Father   . Hypertension Father   . Heart attack Father   . Thyroid disease Other   . Heart disease Other   . COPD Other      Social History   Occupational History  . Occupation: Programmer, multimedia: [employer]  Tobacco Use  . Smoking status: Never Smoker  . Smokeless tobacco: Never Used  Vaping Use  . Vaping Use: Never used  Substance and Sexual Activity  . Alcohol use: No    Alcohol/week: 0.0 standard drinks  . Drug use: No  . Sexual activity: Yes    Birth control/protection: None     Immunization History  Administered Date(s) Administered  . H1N1 07/23/2008  . Influenza Whole 04/24/2008  . Influenza,inj,Quad PF,6+ Mos 04/11/2013, 04/04/2019  . Influenza-Unspecified 04/25/2014, 03/13/2015, 03/12/2016  . Moderna SARS-COVID-2 Vaccination 07/11/2019, 07/31/2019  . Pneumococcal Polysaccharide-23 07/04/2013  . Td 01/02/2009  . Tdap 10/04/2018  . Zoster Recombinat (Shingrix) 03/21/2017, 05/25/2017     Review of systems: Positive Findings in bold print.  Constitutional:  chills, fatigue, fever, sweats, weight change Communication: Optometrist, sign Ecologist, hand writing, iPad/Android device Head: head injury Eyes: changes in vision, eye pain, glaucoma, cataracts, macular degeneration, diplopia, glare, light sensitivity, eyeglasses or contacts, blindness Ears nose mouth throat: hearing impaired, hearing aids,  ringing in ears, deaf, sign language,  vertigo, nosebleeds, rhinitis,  cold sores, snoring, swollen glands Cardiovascular: HTN, edema, arrhythmia, pacemaker in place, defibrillator in place, chest pain/tightness, chronic anticoagulation, blood clot, heart failure, MI Peripheral Vascular: leg cramps, varicose veins, blood clots, lymphedema, varicosities Respiratory:  asthma, difficulty breathing, denies congestion, SOB, wheezing, cough, emphysema, oxygen dependent Gastrointestinal: change in appetite or weight, abdominal pain,  constipation, diarrhea, nausea,  vomiting, vomiting blood, change in bowel habits, abdominal pain, jaundice, rectal bleeding, hemorrhoids, GERD Genitourinary:  nocturia,  pain on urination, polyuria,  blood in urine, Foley catheter, urinary urgency, ESRD on hemodialysis Musculoskeletal: amputation(s), cramping, stiff joints, painful joints, decreased joint motion, fractures, OA, gout, hemiplegia, paraplegia, uses cane, wheelchair bound, uses walker, uses rollator Skin: changes in toenails, color change, dryness, itching, mole changes,  rash, wound(s) Neurological: headaches, numbness in feet, paresthesias in feet, burning in feet, fainting,  seizures, change in speech. denies headaches, memory problems/poor historian, cerebral palsy, weakness, paralysis, CVA, TIA, tremors Endocrine: diabetes, hypothyroidism, hyperthyroidism,  goiter, dry mouth, flushing, heat intolerance,  cold intolerance,  excessive thirst, denies polyuria,  nocturia Hematological:  easy bleeding, excessive bleeding, easy bruising, enlarged lymph nodes, on long term blood thinner, history of past transusions Allergy/immunological:  hives, eczema, frequent infections, multiple drug allergies, seasonal allergies, transplant recipient, multiple food allergies Psychiatric:  anxiety, depression, mood disorder, suicidal ideations, hallucinations, insomnia   Objective:  Physical Exam: There were no vitals filed for this visit.   [name] is a pleasant 64 y.o. Caucasian female WD, WN in NAD. AAO x 3.  Vascular Examination: Neurovascular status unchanged b/l lower extremities. Palpable pedal pulses b/l LE. Pedal hair sparse. Lower extremity skin temperature gradient within normal limits. No pain with calf compression b/l. No edema noted b/l lower extremities. No ischemia or gangrene noted b/l lower extremities.  Dermatological Examination: Pedal skin with normal turgor, texture and tone bilaterally. No open wounds bilaterally. No  interdigital macerations bilaterally. Toenails 1-5 b/l well maintained with adequate length. No erythema, no edema, no drainage, no flocculence.       Wound Location: distal tip R 2nd toe There is a minimal amount of devitalized tissue present in the wound. Predebridement Wound Measurement:  1.5  x 0.5 x 0.1 cm. Postdebridement Wound Measurement: 1.8 x 1.8 x 0.1 cm. Wound Base: Granular/Healthy Peri-wound: Normal Exudate: None Blood Loss during debridement: 0 cc('s). Material in wound which inhibits healing/promotes adjacent tissue breakdown:  nonviable hyperkeratosis. Description of tissue removed from ulceration today:  nonviable hyperkeratosis. Sign(s) of clinical bacterial infection: no clinical signs of infection noted on examination today.  Musculoskeletal Examination: Normal muscle strength 5/5 to all lower extremity muscle groups bilaterally. No pain crepitus or joint limitation noted with ROM b/l. Hammertoes noted to the L 2nd toe, L 3rd toe, R 2nd toe, R 3rd toe and R 4th toe.  Neurological Examination: Protective sensation intact 5/5 intact bilaterally with 10g monofilament b/l. Vibratory sensation intact b/l. Proprioception intact bilaterally.  Hemoglobin A1C Latest Ref Rng & Units 01/07/2020 08/02/2019  HGBA1C 4.6 - 6.5 % 6.3 6.3  Some recent data might be hidden   Radiographs: Right foot, 3 views xray no gas in tissues, no evidence of fracture right 3rd toe, and no bone erosion noted at location of ulceration  Assessment:   1. Diabetic ulcer of toe of right foot associated with type 2 diabetes mellitus, limited to breakdown of skin ([place])    Plan:  -Patient was evaluated and treated and all questions answered.  -Patient/POA/Family member educated on diagnosis and treatment plan of routine ulcer debridement/wound care.  -Ulceration debridement achieved utilizing sharp excisional debridement with sterile scalpel blade.. Type/amount of devitalized tissue removed:  nonviable hyperkeratosis -Today's ulcer size post-debridement: 1.8 x 1.8 x 0.1 cm. -Ulceration cleansed with wound cleanser. triple antibiotic ointment applied to base of ulceration and secured with light dressing. -Wound responded well to today's debridement. -Patient risk factors  affecting healing of ulcer: diabetes, immunosuppressive medications, toe deformity -[name] is to apply triple antibiotic ointment  R 2nd toe once daily. -Rx for Amoxicillin 500 mg, #21, to be taken tid for 7 days. -Patient to report any pedal injuries to medical professional immediately. -Patient to continue soft, supportive shoe gear daily. -Patient/POA to call should there be question/concern in the interim.  Return in about 2 weeks (around 02/12/2020) for diabetic ulcer right 2nd toe.  Marzetta Board, DPM

## 2020-01-30 ENCOUNTER — Telehealth: Payer: Self-pay | Admitting: Family Medicine

## 2020-01-30 NOTE — Telephone Encounter (Signed)
Provider called again to speak with you regarding patient

## 2020-01-30 NOTE — Telephone Encounter (Signed)
Caller : Phylis Bougie MD Call Back # [phone]  Per  Frances Nickels MD,  She is requesting a call back in regards to this patient.

## 2020-01-31 NOTE — Telephone Encounter (Signed)
Would you help me with this---- I had meeting with amanda with lunch and have full schedule--- maybe we need to see if there is anything we can get for that dr..... i'm not going to be able to call --- schedule is too crazy today

## 2020-02-04 ENCOUNTER — Ambulatory Visit: Payer: 59 | Admitting: Family Medicine

## 2020-02-04 ENCOUNTER — Encounter: Payer: Self-pay | Admitting: Family Medicine

## 2020-02-04 ENCOUNTER — Other Ambulatory Visit: Payer: Self-pay

## 2020-02-04 ENCOUNTER — Telehealth: Payer: Self-pay

## 2020-02-04 VITALS — BP 124/70 | HR 73 | Temp 98.2°F | Resp 18 | Ht 71.0 in | Wt 189.8 lb

## 2020-02-04 DIAGNOSIS — M069 Rheumatoid arthritis, unspecified: Secondary | ICD-10-CM | POA: Diagnosis not present

## 2020-02-04 DIAGNOSIS — E785 Hyperlipidemia, unspecified: Secondary | ICD-10-CM | POA: Diagnosis not present

## 2020-02-04 DIAGNOSIS — R937 Abnormal findings on diagnostic imaging of other parts of musculoskeletal system: Secondary | ICD-10-CM

## 2020-02-04 DIAGNOSIS — E118 Type 2 diabetes mellitus with unspecified complications: Secondary | ICD-10-CM | POA: Diagnosis not present

## 2020-02-04 LAB — EXTRA SPECIMEN

## 2020-02-04 LAB — HLA-B27 ANTIGEN

## 2020-02-04 MED ORDER — PREGABALIN 75 MG PO CAPS: 75.0000 mg | ORAL_CAPSULE | Freq: Every day | ORAL | 1 refills | Status: DC

## 2020-02-04 NOTE — Assessment & Plan Note (Addendum)
Lab Results  Component Value Date   HGBA1C 6.3 01/07/2020   Controlled

## 2020-02-04 NOTE — Progress Notes (Signed)
Patient ID: [name], female    DOB: 1955-12-20  Age: 64 y.o. MRN: [id number]    Subjective:  Subjective  HPI [name] presents for f/u for disability -- she saw rheum Dr Kathlene November 7/20 and goes back next month.   Pt will consider Actemra and he will take care of her and she has a lot of swelling / inflammation in her joints.   They are sending her for MRI .   She also needs a f/u for dm and bp and chol. She has seen podiatrist-- and dx hammer toe/ claw toe--- Pt needs new disability paperwork filled out as well  Review of Systems  Constitutional: Negative for appetite change, diaphoresis, fatigue and unexpected weight change.  Eyes: Negative for pain, redness and visual disturbance.  Respiratory: Negative for cough, chest tightness, shortness of breath and wheezing.   Cardiovascular: Negative for chest pain, palpitations and leg swelling.  Endocrine: Negative for cold intolerance, heat intolerance, polydipsia, polyphagia and polyuria.  Genitourinary: Negative for difficulty urinating, dysuria and frequency.  Musculoskeletal: Positive for arthralgias and back pain.  Neurological: Negative for dizziness, light-headedness, numbness and headaches.    History Past Medical History:  Diagnosis Date  . Allergic rhinitis   . Anxiety   . Asthma    hx bronchial asthma at times with upper resp infection  . Depression   . Diabetes mellitus without complication (HCC)    diet controlled no meds  . Fibromyalgia   . GERD (gastroesophageal reflux disease)   . Headache(784.0)    migraine and cluster headaches  . Hyperlipemia   . Hypertension   . IBS (irritable bowel syndrome)   . Menopause   . Neuromuscular disorder ([place]) 2009   hx of fibromyalgia, polyarthralsia (surgery induced)  . Stress incontinence    at times  . Tibial fracture 10/14/2016   evulsion periostial right    She has a past surgical history that includes Cesarean section ([place]); Cervical laminectomy  (2005 & 2009); Foot surgery (2005); right knee (1981); Nasal sinus surgery ([place]); Laparoscopic gastric banding (12/30/09); Esophagogastroduodenoscopy (06/29/2011); Hysteroscopy (1999); laparoscopy (09/24/2011); Gastric banding port revision (09/24/2011); Tonsillectomy (1971); Cholecystectomy (1986); Tubal ligation (1993); right knee arthroscopy and arthrotomy ([id number]); Cervical conization w/bx (june 1990); laparoscopy (07/03/2012); Laparoscopic revision of gastric band (07/03/2012); Mesh applied to lap port (07/03/2012); Dilation and curettage of uterus ( april-1989); Laparoscopic repair and removal of gastric band (N/A, 07/02/2013); Laparoscopic gastric sleeve resection (N/A, 07/02/2013); Abdominal hysterectomy (12/1999); Gastric Roux-En-Y (N/A, 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   Her family history includes Atrial fibrillation in her mother; Breast cancer in her mother; COPD in an other family member; Cancer in her mother; Diabetes in her father and mother; Heart attack in her father; Heart disease in an other family member; Hypertension in her father and mother; Stroke in her father and mother; Thyroid disease in an other family member.She reports that she has never smoked. She has never used smokeless tobacco. She reports that she does not drink alcohol and does not use drugs.  Current Outpatient Medications on File Prior to Visit  Medication Sig Dispense Refill  . acetaminophen (TYLENOL 8 HOUR ARTHRITIS PAIN) 650 MG CR tablet Take 650 mg by mouth 2 (two) times daily as needed for pain.    Marland Kitchen ALPRAZolam (XANAX) 0.25 MG tablet Take 1 tablet (0.25 mg total) by mouth 2 (two) times daily as needed for anxiety. 20 tablet 0  . amoxicillin (AMOXIL) 500 MG capsule  Take 1 capsule (500 mg total) by mouth 3 (three) times daily for 7 days. 21 capsule 0  . Ca Phosphate-Cholecalciferol (CALTRATE GUMMY BITES PO) Take 2 each 2 (two) times daily by mouth.     . Colchicine 0.6 MG CAPS Take 1  capsule by mouth daily.    . cyclobenzaprine (FLEXERIL) 10 MG tablet Take 1 tablet (10 mg total) by mouth 3 (three) times daily as needed. for muscle spams 30 tablet 1  . diclofenac Sodium (VOLTAREN) 1 % GEL Apply 4 g topically 4 (four) times daily. 150 g 1  . DULoxetine (CYMBALTA) 30 MG capsule Take 2 capsules (60 mg total) by mouth daily. 180 capsule 3  . estradiol (VIVELLE-DOT) 0.1 MG/24HR Place 1 patch (0.1 mg total) onto the skin 2 (two) times a week. Monday and Thursday (Patient taking differently: Place 1 patch onto the skin 2 (two) times a week. Mondays and Thursdays) 8 patch 11  . folic acid (FOLVITE) 1 MG tablet Take 1 mg by mouth 3 (three) times daily as needed.     . furosemide (LASIX) 20 MG tablet TAKE 1 TABLET BY MOUTH EVERY MORNING 90 tablet 1  . glucose blood test strip Test blood sugar once daily. Dx Code: E11.9 100 each 12  . HYDROcodone-acetaminophen (NORCO/VICODIN) 5-325 MG tablet Take 1 tablet by mouth every 4 (four) hours as needed.    . hydroxychloroquine (PLAQUENIL) 200 MG tablet 200 mg 2 (two) times daily.     Marland Kitchen leflunomide (ARAVA) 20 MG tablet Take 20 mg by mouth daily.    Marland Kitchen loratadine (CLARITIN) 10 MG tablet Take 10 mg by mouth every evening.     . methocarbamol (ROBAXIN) 500 MG tablet Take 1 tablet (500 mg total) by mouth 4 (four) times daily. 45 tablet 1  . NONFORMULARY OR COMPOUNDED ITEM bp cuff  Dx hypertension 1 each 0  . olmesartan (BENICAR) 5 MG tablet Take 1 tablet (5 mg total) by mouth daily. 90 tablet 3  . Pediatric Multivit-Minerals-C (ONE-A-DAY SCOOBY-DOO GUMMIES PO) Take 1 tablet 2 (two) times daily by mouth.     . potassium chloride (KLOR-CON) 10 MEQ tablet TAKE 1 TABLET (10 MEQ TOTAL) BY MOUTH DAILY. 90 tablet 1  . pregabalin (LYRICA) 75 MG capsule Take 75 mg by mouth daily.    Marland Kitchen PREMARIN vaginal cream Apply 1 application topically 2 (two) times a week. Mondays & Thursdays.  1  . rosuvastatin (CRESTOR) 20 MG tablet 1 po qd 90 tablet 2  . temazepam  (RESTORIL) 30 MG capsule TAKE 1 CAPSULE (30 MG TOTAL) BY MOUTH AT BEDTIME AS NEEDED FOR SLEEP. 90 capsule 3  . thiamine (VITAMIN B-1) 100 MG tablet Take 100 mg daily by mouth.    . traMADol (ULTRAM) 50 MG tablet tramadol 50 mg tablet  TAKE 1 TABLET BY MOUTH EVERY 8 HOURS AS NEEDED     No current facility-administered medications on file prior to visit.     Objective:  Objective  Physical Exam Vitals and nursing note reviewed.  Constitutional:      Appearance: She is well-developed.  HENT:     Head: Normocephalic and atraumatic.  Eyes:     Conjunctiva/sclera: Conjunctivae normal.  Neck:     Thyroid: No thyromegaly.     Vascular: No carotid bruit or JVD.  Cardiovascular:     Rate and Rhythm: Normal rate and regular rhythm.     Heart sounds: Murmur heard.   Pulmonary:     Effort: Pulmonary effort is  normal. No respiratory distress.     Breath sounds: Normal breath sounds. No wheezing or rales.  Chest:     Chest wall: No tenderness.  Musculoskeletal:        General: Swelling and tenderness present. No deformity.     Cervical back: Normal range of motion and neck supple.  Neurological:     Mental Status: She is alert and oriented to person, place, and time.    BP 124/70   Pulse 73   Temp 98.2 F (36.8 C) (Oral)   Resp 18   Ht '5\' 11"'  (1.803 m)   Wt 189 lb 12.8 oz (86.1 kg)   SpO2 98%   BMI 26.47 kg/m  Wt Readings from Last 3 Encounters:  02/04/20 189 lb 12.8 oz (86.1 kg)  01/07/20 184 lb 6.4 oz (83.6 kg)  12/03/19 183 lb (83 kg)     Lab Results  Component Value Date   WBC 8.9 08/24/2018   HGB 13.8 08/24/2018   HCT 42.4 08/24/2018   PLT 259.0 08/24/2018   GLUCOSE 94 01/07/2020   CHOL 92 01/07/2020   TRIG 55.0 01/07/2020   HDL 45.30 01/07/2020   LDLCALC 35 01/07/2020   ALT 21 01/07/2020   AST 18 01/07/2020   NA 140 01/07/2020   K 4.2 01/07/2020   CL 104 01/07/2020   CREATININE 0.67 01/07/2020   BUN 12 01/07/2020   CO2 31 01/07/2020   TSH 1.78  01/26/2019   HGBA1C 6.3 01/07/2020   MICROALBUR 0.8 08/02/2019    MM 3D SCREEN BREAST BILATERAL  Result Date: 07/18/2019 CLINICAL DATA:  Screening. EXAM: DIGITAL SCREENING BILATERAL MAMMOGRAM WITH TOMO AND CAD COMPARISON:  Previous exam(s). ACR Breast Density Category b: There are scattered areas of fibroglandular density. FINDINGS: There are no findings suspicious for malignancy. Images were processed with CAD. IMPRESSION: No mammographic evidence of malignancy. A result letter of this screening mammogram will be mailed directly to the patient. RECOMMENDATION: Screening mammogram in one year. (Code:SM-B-01Y) BI-RADS CATEGORY  1: Negative. Electronically Signed   By: Audie Pinto M.D.   On: 07/18/2019 16:59     Assessment & Plan:  Plan  I have discontinued Charmaine Downs. Berkey's nystatin. I am also having her start on pregabalin. Additionally, I am having her maintain her loratadine, Ca Phosphate-Cholecalciferol (CALTRATE GUMMY BITES PO), Pediatric Multivit-Minerals-C (ONE-A-DAY SCOOBY-DOO GUMMIES PO), estradiol, Premarin, thiamine, acetaminophen, glucose blood, cyclobenzaprine, folic acid, hydroxychloroquine, ALPRAZolam, DULoxetine, furosemide, methocarbamol, olmesartan, potassium chloride, leflunomide, diclofenac Sodium, NONFORMULARY OR COMPOUNDED ITEM, temazepam, pregabalin, rosuvastatin, Colchicine, HYDROcodone-acetaminophen, traMADol, and amoxicillin.  Meds ordered this encounter  Medications  . pregabalin (LYRICA) 75 MG capsule    Sig: Take 1 capsule (75 mg total) by mouth daily.    Dispense:  90 capsule    Refill:  1    Problem List Items Addressed This Visit      Unprioritized   Controlled type 2 diabetes mellitus with complication, without long-term current use of insulin ([place])    Lab Results  Component Value Date   HGBA1C 6.3 01/07/2020   Controlled       Hyperlipidemia LDL goal <100    Encouraged heart healthy diet, increase exercise, avoid trans fats, consider a krill  oil cap daily       Other Visit Diagnoses    Abnormal x-ray of spine    -  Primary   Relevant Orders   HLA-B27 Antigen   Rheumatoid arthritis involving multiple sites, unspecified whether rheumatoid factor present ([place])  Relevant Medications   pregabalin (LYRICA) 75 MG capsule    paperwork for LT disability filled out again  Pt has new rheum  F/u 1 month  Follow-up: Return in about 4 weeks (around 03/03/2020), or if symptoms worsen or fail to improve.  [name], DO

## 2020-02-04 NOTE — Assessment & Plan Note (Signed)
Encouraged heart healthy diet, increase exercise, avoid trans fats, consider a krill oil cap daily 

## 2020-02-04 NOTE — Telephone Encounter (Signed)
Patient came into the office for a visit. She requested that her form be filled out. Form has been filled out and signed by Dr. Etter Sjogren. Form was faxed. Confirmation fax was received.

## 2020-02-04 NOTE — Patient Instructions (Signed)
Rheumatoid Arthritis Rheumatoid arthritis (RA) is a long-term (chronic) disease that causes inflammation in your joints. RA may start slowly. It most often affects the small joints of the hands and feet. Usually, the same joints are affected on both sides of your body. Inflammation from RA can also affect other parts of your body, including your heart, eyes, or lungs. There is no cure for RA, but medicines can help your symptoms and halt or slow down the progression of the disease. What are the causes? RA is an autoimmune disease. When you have an autoimmune disease, your body's defense system (immune system) mistakenly attacks healthy body tissues. The exact cause of RA is not known. What increases the risk? You are more likely to develop this condition if you:  Are a woman.  Have a family history of RA or other autoimmune diseases.  Have a history of smoking.  Are obese.  Have been exposed to pollutants or chemicals. What are the signs or symptoms? The first symptom of this condition may be morning stiffness that lasts longer than 30 minutes.  Symptoms usually start gradually. They are often worse in the morning. As RA progresses, symptoms may include:  Pain, stiffness, swelling, warmth, and tenderness in joints on both sides of your body.  Loss of energy.  Loss of appetite.  Weight loss.  Low-grade fever.  Dry eyes and dry mouth.  Firm lumps (rheumatoid nodules) that grow beneath your skin in areas such as your forearm bones near your elbows and on your hands.  Changes in the appearance of joints (deformity) and loss of joint function. Symptoms of this condition vary from person to person.  Symptoms of RA often come and go.  Sometimes, symptoms get worse for a period of time. These are called flares. How is this diagnosed? This condition is diagnosed based on your symptoms, medical history, and physical exam.  You may have X-rays or an MRI to check for the type of  joint changes that are caused by RA. You may also have blood tests to look for:  Proteins (antibodies) that your immune system may make if you have RA. These include rheumatoid factor (RF) and anti-CCP. ? When blood tests show these proteins, you are said to have "seropositive RA." ? When blood tests do not show these proteins, you may have "seronegative RA."  Inflammation in your blood.  A low number of red blood cells (anemia). How is this treated? The goals of treatment are to relieve pain, reduce inflammation, and slow down or stop joint damage and disability. Treatment may include:  Lifestyle changes. It is important to rest as needed, eat a healthy diet, and exercise.  Medicines. Your health care provider may adjust your medicines every 3 months until treatment goals are reached. Common medicines include: ? Pain relievers (analgesics). ? Corticosteroids and NSAIDs to reduce inflammation. ? Disease-modifying antirheumatic drugs (DMARDs) to try to slow the course of the disease. ? Biologic response modifiers to reduce inflammation and damage.  Physical therapy and occupational therapy.  Surgery, if you have severe joint damage. Joint replacement or fusing of joints may be needed. Your health care provider will work with you to identify the best treatment option for you based on assessment of the overall disease activity in your body. Follow these instructions at home: Activity  Return to your normal activities as told by your health care provider. Ask your health care provider what activities are safe for you.  Rest when you are having a   flare.  Start an exercise program as told by your health care provider. General instructions  Keep all follow-up visits as told by your health care provider. This is important.  Take over-the-counter and prescription medicines only as told by your health care provider. Where to find more information  American College of Rheumatology:  www.rheumatology.org  Arthritis Foundation: www.arthritis.org Contact a health care provider if:  You have a flare-up of RA symptoms.  You have a fever.  You have side effects from your medicines. Get help right away if:  You have chest pain.  You have trouble breathing.  You quickly develop a hot, painful joint that is more severe than your usual joint aches. Summary  Rheumatoid arthritis (RA) is a long-term (chronic) disease that causes inflammation in your joints.  RA is an autoimmune disease.  The goals of treatment are to relieve pain, reduce inflammation, and slow down or stop joint damage and disability. This information is not intended to replace advice given to you by your health care provider. Make sure you discuss any questions you have with your health care provider. Document Revised: 01/09/2019 Document Reviewed: 02/28/2018 Elsevier Patient Education  2020 Elsevier Inc.  

## 2020-02-05 ENCOUNTER — Other Ambulatory Visit: Payer: 59

## 2020-02-05 ENCOUNTER — Other Ambulatory Visit: Payer: Self-pay | Admitting: Rheumatology

## 2020-02-05 ENCOUNTER — Other Ambulatory Visit (HOSPITAL_COMMUNITY): Payer: Self-pay | Admitting: Rheumatology

## 2020-02-05 DIAGNOSIS — R937 Abnormal findings on diagnostic imaging of other parts of musculoskeletal system: Secondary | ICD-10-CM | POA: Diagnosis not present

## 2020-02-05 DIAGNOSIS — M069 Rheumatoid arthritis, unspecified: Secondary | ICD-10-CM | POA: Diagnosis not present

## 2020-02-05 DIAGNOSIS — M549 Dorsalgia, unspecified: Secondary | ICD-10-CM

## 2020-02-05 NOTE — Addendum Note (Signed)
Addended by: Caffie Pinto on: 02/05/2020 09:20 AM   Modules accepted: Orders

## 2020-02-05 NOTE — Telephone Encounter (Signed)
Could you advise on what this means?

## 2020-02-05 NOTE — Telephone Encounter (Signed)
I have left a message on the VM at Dr. Henreitta Cea office stating if they have any paperwork they can send over with questions for completion they can do so or if they can return the call to me I can be of assistance because you are in with patients during the day.  I have not heard back from her office as of yet.

## 2020-02-05 NOTE — Telephone Encounter (Signed)
Called patient and explained lab error with processing. She will return today for recollection, I will place future order.

## 2020-02-06 ENCOUNTER — Telehealth: Payer: Self-pay | Admitting: Family Medicine

## 2020-02-06 LAB — HLA-B27 ANTIGEN: HLA-B27 Antigen: NEGATIVE

## 2020-02-06 NOTE — Telephone Encounter (Signed)
Caller: Elita Quick [employer]) Call back phone # [phone]  Elita Quick wants to know if you received disability form

## 2020-02-06 NOTE — Telephone Encounter (Signed)
I have received paperwork. Placed in providers folder to sign.

## 2020-02-08 ENCOUNTER — Ambulatory Visit (INDEPENDENT_AMBULATORY_CARE_PROVIDER_SITE_OTHER): Payer: 59 | Admitting: Cardiology

## 2020-02-08 ENCOUNTER — Encounter: Payer: Self-pay | Admitting: Cardiology

## 2020-02-08 ENCOUNTER — Other Ambulatory Visit: Payer: Self-pay

## 2020-02-08 VITALS — BP 170/76 | HR 71 | Ht 71.0 in | Wt 192.4 lb

## 2020-02-08 DIAGNOSIS — E785 Hyperlipidemia, unspecified: Secondary | ICD-10-CM | POA: Diagnosis not present

## 2020-02-08 DIAGNOSIS — R011 Cardiac murmur, unspecified: Secondary | ICD-10-CM | POA: Diagnosis not present

## 2020-02-08 DIAGNOSIS — R0600 Dyspnea, unspecified: Secondary | ICD-10-CM | POA: Diagnosis not present

## 2020-02-08 DIAGNOSIS — I10 Essential (primary) hypertension: Secondary | ICD-10-CM

## 2020-02-08 DIAGNOSIS — I350 Nonrheumatic aortic (valve) stenosis: Secondary | ICD-10-CM | POA: Insufficient documentation

## 2020-02-08 DIAGNOSIS — R0609 Other forms of dyspnea: Secondary | ICD-10-CM | POA: Insufficient documentation

## 2020-02-08 NOTE — Patient Instructions (Addendum)
Medication Instructions:  Your physician recommends that you continue on your current medications as directed. Please refer to the Current Medication list given to you today.  *If you need a refill on your cardiac medications before your next appointment, please call your pharmacy*  Lab Work: NONE ordered at this time of appointment   If you have labs (blood work) drawn today and your tests are completely normal, you will receive your results only by: Marland Kitchen MyChart Message (if you have MyChart) OR . A paper copy in the mail If you have any lab test that is abnormal or we need to change your treatment, we will call you to review the results.  Testing/Procedures: Your physician has requested that you have an echocardiogram. Echocardiography is a painless test that uses sound waves to create images of your heart. It provides your doctor with information about the size and shape of your heart and how well your heart's chambers and valves are working. This procedure takes approximately one hour. There are no restrictions for this procedure.    Please schedule for 3-4 weeks   Follow-Up: At [facility], you and your health needs are our priority.  As part of our continuing mission to provide you with exceptional heart care, we have created designated Provider Care Teams.  These Care Teams include your primary Cardiologist (physician) and Advanced Practice Providers (APPs -  Physician Assistants and Nurse Practitioners) who all work together to provide you with the care you need, when you need it.  We recommend signing up for the patient portal called "MyChart".  Sign up information is provided on this After Visit Summary.  MyChart is used to connect with patients for Virtual Visits (Telemedicine).  Patients are able to view lab/test results, encounter notes, upcoming appointments, etc.  Non-urgent messages can be sent to your provider as well.   To learn more about what you can do with MyChart, go to  [email or web address].    Your next appointment:   Follow up after echocardiogram   The format for your next appointment:   Either in person or Virtual  Provider:   Glenetta Hew, MD  Other Instructions

## 2020-02-08 NOTE — Progress Notes (Signed)
Primary Care Provider: Carollee Herter, Alferd Apa, DO Cardiologist: No primary care provider on file. Electrophysiologist: None  Rheumatologist: Dr. Kathlene November  Clinic Note: Chief Complaint  Patient presents with   New Patient (Initial Visit)    Referred for murmur   Heart Murmur    New exam finding per PCP   Palpitations    2-3 short episodes   Shortness of Breath    Increasing exertional shortness of breath   HPI:    [name] is a 64 y.o. female with a history of DM-2, HTN, HLD (s/p GOP with ++ wgt loss), RA (OA) & Fibromyalgia who is being seen today for the evaluation of [place] at the request of [name], *.  [name] was productive being seen by Dr. Carollee Herter on June 28. She heard a murmur on exam and ordered 2D echo. This has not yet been done --> patient decided that she would prefer to hear from Cardiologist if needed to be done.  Recent Hospitalizations: None  Reviewed  CV studies:    The following studies were reviewed today: (if available, images/films reviewed: From Epic Chart or Care Everywhere)  None:    Interval History:   [name] presents here today for cardiology evaluation. She has a pretty extensive rheumatoid arthritis/osteoarthritis and fibromyalgia history with multiple various complaints. She has had left knee, right foot and ankle issues. She is status post gastric bypass in 2018 with a significant weight loss.  She says she is trying to exercise, but obviously is limited by arthritis symptoms. She has now noted though that she gets short of breath much more frequently/easily than she has had noted in the past. Her exercise tolerance is significantly reduced. She is not sure if this is because of worsening fibromyalgia/rheumatoid right symptoms or true dyspnea, but is now not able to walk more than 2 flights of steps without getting short of breath and she used to be a do much more. A little bit  better on flat land, but still more exertional dyspnea.  She denies any chest pain or pressure but has had at least 2 spells of rapid palpitations in the last month that have lasted a few seconds. No other associated symptoms.  She is undergoing evaluation by rheumatology to consider possibility of starting [place].  CV Review of Symptoms (Summary) Cardiovascular ROS: positive for - dyspnea on exertion, palpitations, rapid heart rate and More than usual exercise intolerance negative for - chest pain, edema, orthopnea, paroxysmal nocturnal dyspnea, shortness of breath or Although she may have some lightheadedness and dizziness, no syncope or near syncope. No TIA or amaurosis fugax symptoms. No claudication.  The patient does not have symptoms concerning for COVID-19 infection (fever, chills, cough, or new shortness of breath).  The patient is practicing social distancing & Masking.    REVIEWED OF SYSTEMS   Review of Systems  Constitutional: Positive for malaise/fatigue. Negative for weight loss.  HENT: Negative for nosebleeds.   Gastrointestinal: Positive for constipation, diarrhea (Not necessarily diarrhea but loose stool with IBS.) and nausea. Negative for blood in stool and melena.  Genitourinary: Negative for hematuria.  Musculoskeletal: Positive for back pain, joint pain (Left knee, right foot, ankle) and myalgias.       Fibromyalgia and arthritis pains  Neurological: Positive for dizziness and weakness (Generalized). Negative for focal weakness (She can occasionally have focal weakness, but not specific).  Psychiatric/Behavioral: Negative for depression and memory loss.  The patient is nervous/anxious.      I have reviewed and (if needed) personally updated the patient's problem list, medications, allergies, past medical and surgical history, social and family history.   PAST MEDICAL HISTORY   Past Medical History:  Diagnosis Date   Allergic rhinitis    Anxiety    Asthma     hx bronchial asthma at times with upper resp infection   Depression    Diabetes mellitus without complication (HCC)    diet controlled no meds   Fibromyalgia    GERD (gastroesophageal reflux disease)    Headache(784.0)    migraine and cluster headaches   Hyperlipemia    Hypertension    IBS (irritable bowel syndrome)    Menopause    Neuromuscular disorder ([facility]) 2009   hx of fibromyalgia, polyarthralsia (surgery induced)   Rheumatoid arthritis ([place])    Complicated by osteoarthritis as well.   Stress incontinence    at times   Tibial fracture 10/14/2016   evulsion periostial right  --> Patient indicates having a history of rheumatoid arthritis and osteoarthritis as well as fibromyalgia.   PAST SURGICAL HISTORY   Past Surgical History:  Procedure Laterality Date   ABDOMINAL HYSTERECTOMY  12/1999   complete   CARPAL TUNNEL RELEASE Right 12/27/2017   Procedure: CARPAL TUNNEL RELEASE;  Surgeon: Roseanne Kaufman, MD;  Location: [facility];  Service: Orthopedics;  Laterality: Right;   CERVICAL CONIZATION W/BX  june 1990   dysplasia of cervix/used 5Fu cream for 3 months   CERVICAL LAMINECTOMY  2005 & 2009   X 2   NO ROM [place]   X 2   CHOLECYSTECTOMY  1986   DILATION AND CURETTAGE OF UTERUS   [phone]   missed abortion   ESOPHAGOGASTRODUODENOSCOPY  06/29/2011   Procedure: ESOPHAGOGASTRODUODENOSCOPY (EGD);  Surgeon: Shann Medal, MD;  Location: [facility];  Service: General;  Laterality: N/A;   FOOT SURGERY  2005   RT HEEL   GASTRIC BANDING PORT REVISION  09/24/2011   Procedure: GASTRIC BANDING PORT REVISION;  Surgeon: Pedro Earls, MD;  Location: [facility];  Service: General;  Laterality: N/A;   GASTRIC ROUX-EN-Y N/A 06/06/2017   Procedure: Conversion from [place] to [place];  Surgeon: Johnathan Hausen, MD;  Location: [facility];  Service: General;  Laterality: N/A;    [place]  12/30/09   LAPAROSCOPIC GASTRIC SLEEVE RESECTION N/A 07/02/2013   Procedure: LAPAROSCOPIC GASTRIC SLEEVE RESECTION upper endoscopy;  Surgeon: Pedro Earls, MD;  Location: [facility];  Service: General;  Laterality: N/A;   LAPAROSCOPIC REPAIR AND REMOVAL OF GASTRIC BAND N/A 07/02/2013   Procedure: LAPAROSCOPIC REMOVAL OF GASTRIC BAND;  Surgeon: Pedro Earls, MD;  Location: [facility];  Service: General;  Laterality: N/A;   LAPAROSCOPIC REVISION OF GASTRIC BAND  07/03/2012   Procedure: LAPAROSCOPIC REVISION OF GASTRIC BAND;  Surgeon: Pedro Earls, MD;  Location: [facility];  Service: General;  Laterality: N/A;  removal of old lap. band port, replaced with AP standard band   LAPAROSCOPY  09/24/2011   Procedure: LAPAROSCOPY DIAGNOSTIC;  Surgeon: Pedro Earls, MD;  Location: [facility];  Service: General;  Laterality: N/A;   LAPAROSCOPY  07/03/2012   Procedure: LAPAROSCOPY DIAGNOSTIC;  Surgeon: Pedro Earls, MD;  Location: [facility];  Service: General;  Laterality: N/A;  Exploratory Laparoscopy    MESH APPLIED TO LAP PORT  07/03/2012  Procedure: MESH APPLIED TO LAP PORT;  Surgeon: Pedro Earls, MD;  Location: [facility];  Service: General;  Laterality: N/A;   [place]   X 2   right knee  1981   ARTHROSCOPY AND ARTHROTOMY   right knee arthroscopy and arthrotomy  12-1979   TONSILLECTOMY  1971   TUBAL LIGATION  1993   WITH C -SECTION    MEDICATIONS/ALLERGIES   Current Meds  Medication Sig   acetaminophen (TYLENOL 8 HOUR ARTHRITIS PAIN) 650 MG CR tablet Take 650 mg by mouth 2 (two) times daily as needed for pain.   ALPRAZolam (XANAX) 0.25 MG tablet Take 1 tablet (0.25 mg total) by mouth 2 (two) times daily as needed for anxiety.   Ca Phosphate-Cholecalciferol (CALTRATE GUMMY BITES PO) Take 2 each 2 (two) times daily by mouth.    Colchicine 0.6 MG CAPS Take 1 capsule by mouth daily.   cyclobenzaprine  (FLEXERIL) 10 MG tablet Take 1 tablet (10 mg total) by mouth 3 (three) times daily as needed. for muscle spams   diclofenac Sodium (VOLTAREN) 1 % GEL Apply 4 g topically 4 (four) times daily.   DULoxetine (CYMBALTA) 30 MG capsule Take 2 capsules (60 mg total) by mouth daily.   estradiol (VIVELLE-DOT) 0.1 MG/24HR Place 1 patch (0.1 mg total) onto the skin 2 (two) times a week. Monday and Thursday (Patient taking differently: Place 1 patch onto the skin 2 (two) times a week. Mondays and Thursdays)   folic acid (FOLVITE) 1 MG tablet Take 1 mg by mouth 3 (three) times daily as needed.    furosemide (LASIX) 20 MG tablet TAKE 1 TABLET BY MOUTH EVERY MORNING   glucose blood test strip Test blood sugar once daily. Dx Code: E11.9   HYDROcodone-acetaminophen (NORCO/VICODIN) 5-325 MG tablet Take 1 tablet by mouth every 4 (four) hours as needed.   hydroxychloroquine (PLAQUENIL) 200 MG tablet 200 mg 2 (two) times daily.    leflunomide (ARAVA) 20 MG tablet Take 20 mg by mouth daily.   loratadine (CLARITIN) 10 MG tablet Take 10 mg by mouth every evening.    methocarbamol (ROBAXIN) 500 MG tablet Take 1 tablet (500 mg total) by mouth 4 (four) times daily.   NONFORMULARY OR COMPOUNDED ITEM bp cuff  Dx hypertension   olmesartan (BENICAR) 5 MG tablet Take 1 tablet (5 mg total) by mouth daily.   Pediatric Multivit-Minerals-C (ONE-A-DAY SCOOBY-DOO GUMMIES PO) Take 1 tablet 2 (two) times daily by mouth.    potassium chloride (KLOR-CON) 10 MEQ tablet TAKE 1 TABLET (10 MEQ TOTAL) BY MOUTH DAILY.   pregabalin (LYRICA) 75 MG capsule Take 75 mg by mouth daily.   pregabalin (LYRICA) 75 MG capsule Take 1 capsule (75 mg total) by mouth daily.   PREMARIN vaginal cream Apply 1 application topically 2 (two) times a week. Mondays & Thursdays.   rosuvastatin (CRESTOR) 20 MG tablet 1 po qd   temazepam (RESTORIL) 30 MG capsule TAKE 1 CAPSULE (30 MG TOTAL) BY MOUTH AT BEDTIME AS NEEDED FOR SLEEP.   thiamine  (VITAMIN B-1) 100 MG tablet Take 100 mg daily by mouth.   traMADol (ULTRAM) 50 MG tablet tramadol 50 mg tablet  TAKE 1 TABLET BY MOUTH EVERY 8 HOURS AS NEEDED    Allergies  Allergen Reactions   Isoniazid     Severe SOB   Nitrofurantoin Shortness Of Breath    REACTION: welts   Hctz [Hydrochlorothiazide]     rash   Promethazine Hcl [  Promethazine Hcl]     IF GIVEN  IV-hallucinations     CAN TAKE PO PHENERGAN   Tamiflu [Oseltamivir Phosphate] Nausea And Vomiting    "I vomited within 30 minutes of taking it and was told I cannot ever take it again."   Macrolides And Ketolides     rash   Morphine Other (See Comments)    severe vomiting   Percocet [Oxycodone-Acetaminophen]     REACTION: severe itching. Can take by mouth codeine   Roxicet [Oxycodone-Acetaminophen]     itching   Sulfamethoxazole-Trimethoprim     Septra / Bactrim  --REACTION: rash   Fentanyl Hives and Rash    REACTION: welts    SOCIAL HISTORY/FAMILY HISTORY   Social History   Tobacco Use   Smoking status: Never Smoker   Smokeless tobacco: Never Used  Vaping Use   Vaping Use: Never used  Substance Use Topics   Alcohol use: No    Alcohol/week: 0.0 standard drinks   Drug use: No   Social History   Social History Narrative   Not on file   Family History family history includes AAA (abdominal aortic aneurysm) (age of onset: 77) in her father; Atrial fibrillation in her mother; Breast cancer in her mother; CAD (age of onset: 54) in her father; COPD in an other family member; Cancer (age of onset: 57) in her mother; Dementia in her father; Diabetes in her father and mother; Heart attack (age of onset: 65) in her father; Heart disease in an other family member; Hypertension in her father and mother; Stroke in her mother; Stroke (age of onset: 67) in her father; Thyroid disease in an other family member.   OBJCTIVE -PE, EKG, labs   Wt Readings from Last 3 Encounters:  02/08/20 192 lb 6.4 oz  (87.3 kg)  02/04/20 189 lb 12.8 oz (86.1 kg)  01/07/20 184 lb 6.4 oz (83.6 kg)    Physical Exam: BP (!) 170/76    Pulse 71    Ht 5\' 11"  (1.803 m)    Wt 192 lb 6.4 oz (87.3 kg)    SpO2 99%    BMI 26.83 kg/m  Physical Exam Vitals reviewed.  Constitutional:      General: She is not in acute distress.    Appearance: Normal appearance. She is normal weight. She is not ill-appearing.  HENT:     Head: Normocephalic and atraumatic.  Neck:     Vascular: No carotid bruit, hepatojugular reflux or JVD.  Cardiovascular:     Rate and Rhythm: Normal rate and regular rhythm.  No extrasystoles are present.    Chest Wall: PMI is not displaced.     Pulses: Normal pulses.     Heart sounds: Murmur (1/6 SEM at RUSB--neck) heard.  No friction rub. No gallop.   Pulmonary:     Effort: Pulmonary effort is normal. No respiratory distress.     Breath sounds: Normal breath sounds.  Chest:     Chest wall: Tenderness (Bilateral costosternal border tenderness) present.  Abdominal:     General: Abdomen is flat. Bowel sounds are normal.     Palpations: Abdomen is soft.     Comments: No HSM.  Musculoskeletal:        General: No swelling.     Cervical back: Normal range of motion and neck supple.     Comments: She is notably uncomfortable getting down from the examination table. Very stiff. Slow antalgic gait with what appears to be generalized pain etc. favoring one  particular area.  Neurological:     General: No focal deficit present.     Mental Status: She is alert and oriented to person, place, and time.  Psychiatric:        Mood and Affect: Mood normal.        Behavior: Behavior normal.        Thought Content: Thought content normal.        Judgment: Judgment normal.     Comments: Is a little anxious     Adult ECG Report  Rate: 71 ;  Rhythm: normal sinus rhythm and IVCD otherwise normal axis, intervals durations.;   Narrative Interpretation: Borderline EKG  Recent Labs:    Lab Results   Component Value Date   CHOL 92 01/07/2020   HDL 45.30 01/07/2020   LDLCALC 35 01/07/2020   TRIG 55.0 01/07/2020   CHOLHDL 2 01/07/2020   Lab Results  Component Value Date   CREATININE 0.67 01/07/2020   BUN 12 01/07/2020   NA 140 01/07/2020   K 4.2 01/07/2020   CL 104 01/07/2020   CO2 31 01/07/2020   Lab Results  Component Value Date   TSH 1.78 01/26/2019    ASSESSMENT/PLAN    Problem List Items Addressed This Visit    Hyperlipidemia LDL goal <100 (Chronic)    Per PCP, current plan is less than modification with diet and potential krill oil. Lipids look great with moderate-dose rosuvastatin      Essential hypertension (Chronic)    Pressure have been pretty well controlled at all of her PCPs visits. Currently her pressure is quite high, but she is having a lot of pain today. She is only on very low-dose on losartan plus Lasix. I would like to see what her follow-up blood pressures are before we act.      DOE (dyspnea on exertion)    New onset exertional dyspnea along with a diagnosis of murmur.  It is hard to tell if her exertional dyspnea is complicated by her RA, OA and fibromyalgia symptoms.  Plan: Check 2D echocardiogram, and low threshold to consider stress test      Relevant Orders   EKG 12-Lead   ECHOCARDIOGRAM COMPLETE   Systolic ejection murmur - Primary    Relatively soft aortic valve region murmur. 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 The most notable reason why I am evaluating is because she is having exertional dyspnea.  Plan: 2D echo      Relevant Orders   EKG 12-Lead   ECHOCARDIOGRAM COMPLETE       COVID-19 Education: The signs and symptoms of COVID-19 were discussed with the patient and how to seek care for testing (follow up with PCP or arrange E-visit).   The importance of social distancing and COVID-19 vaccination was discussed today.  I spent a total of 56minutes with  the patient. >  50% of the time was spent in direct patient consultation.  Additional time spent with chart review  / charting (studies, outside notes, etc): 10 Total Time: 38 min   Current medicines are reviewed at length with the patient today.  (+/- concerns) n/a  Notice: This dictation was prepared with Dragon dictation along with smaller phrase technology. Any transcriptional errors that result from this process are unintentional and may not be corrected upon review.  Patient Instructions / Medication Changes & Studies & Tests Ordered   Patient Instructions  Medication Instructions:  Your physician recommends  that you continue on your current medications as directed. Please refer to the Current Medication list given to you today.  *If you need a refill on your cardiac medications before your next appointment, please call your pharmacy*  Lab Work: NONE ordered at this time of appointment   If you have labs (blood work) drawn today and your tests are completely normal, you will receive your results only by:  [facility] (if you have MyChart) OR  A paper copy in the mail If you have any lab test that is abnormal or we need to change your treatment, we will call you to review the results.  Testing/Procedures: Your physician has requested that you have an echocardiogram. Echocardiography is a painless test that uses sound waves to create images of your heart. It provides your doctor with information about the size and shape of your heart and how well your hearts chambers and valves are working. This procedure takes approximately one hour. There are no restrictions for this procedure.    Please schedule for 3-4 weeks   Follow-Up: At [facility], you and your health needs are our priority.  As part of our continuing mission to provide you with exceptional heart care, we have created designated Provider Care Teams.  These Care Teams include your primary Cardiologist  (physician) and Advanced Practice Providers (APPs -  Physician Assistants and Nurse Practitioners) who all work together to provide you with the care you need, when you need it.  We recommend signing up for the patient portal called "MyChart".  Sign up information is provided on this After Visit Summary.  MyChart is used to connect with patients for Virtual Visits (Telemedicine).  Patients are able to view lab/test results, encounter notes, upcoming appointments, etc.  Non-urgent messages can be sent to your provider as well.   To learn more about what you can do with MyChart, go to [email or web address].    Your next appointment:   Follow up after echocardiogram   The format for your next appointment:   Either in person or Virtual  Provider:   Glenetta Hew, MD  Other Instructions      Studies Ordered:   Orders Placed This Encounter  Procedures   EKG 12-Lead   ECHOCARDIOGRAM COMPLETE     Glenetta Hew, M.D., M.S. Interventional Cardiologist   Pager # [phone] Phone # [phone] [address]. [place],  [address]   Thank you for choosing Heartcare at [facility]!!

## 2020-02-11 ENCOUNTER — Ambulatory Visit (INDEPENDENT_AMBULATORY_CARE_PROVIDER_SITE_OTHER): Payer: 59 | Admitting: Psychology

## 2020-02-11 ENCOUNTER — Encounter: Payer: Self-pay | Admitting: Cardiology

## 2020-02-11 DIAGNOSIS — F4323 Adjustment disorder with mixed anxiety and depressed mood: Secondary | ICD-10-CM | POA: Diagnosis not present

## 2020-02-11 MED FILL — LEFLUNOMIDE 20 MG TABLET: 20 | 30 days supply | Qty: 30 | Fill #1

## 2020-02-11 NOTE — Assessment & Plan Note (Signed)
New onset exertional dyspnea along with a diagnosis of murmur.  It is hard to tell if her exertional dyspnea is complicated by her RA, OA and fibromyalgia symptoms.  Plan: Check 2D echocardiogram, and low threshold to consider stress test

## 2020-02-11 NOTE — Assessment & Plan Note (Signed)
Relatively soft aortic valve region murmur. 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 The most notable reason why I am evaluating is because she is having exertional dyspnea.  Plan: 2D echo

## 2020-02-11 NOTE — Assessment & Plan Note (Signed)
Pressure have been pretty well controlled at all of her PCPs visits. Currently her pressure is quite high, but she is having a lot of pain today. She is only on very low-dose on losartan plus Lasix. I would like to see what her follow-up blood pressures are before we act.

## 2020-02-11 NOTE — Assessment & Plan Note (Signed)
Per PCP, current plan is less than modification with diet and potential krill oil. Lipids look great with moderate-dose rosuvastatin

## 2020-02-12 ENCOUNTER — Encounter: Payer: Self-pay | Admitting: Family Medicine

## 2020-02-13 ENCOUNTER — Encounter: Payer: Self-pay | Admitting: Podiatry

## 2020-02-13 ENCOUNTER — Other Ambulatory Visit: Payer: Self-pay

## 2020-02-13 ENCOUNTER — Ambulatory Visit: Payer: 59 | Admitting: Podiatry

## 2020-02-13 DIAGNOSIS — E119 Type 2 diabetes mellitus without complications: Secondary | ICD-10-CM

## 2020-02-13 DIAGNOSIS — M2041 Other hammer toe(s) (acquired), right foot: Secondary | ICD-10-CM

## 2020-02-13 DIAGNOSIS — M2042 Other hammer toe(s) (acquired), left foot: Secondary | ICD-10-CM

## 2020-02-13 DIAGNOSIS — E11621 Type 2 diabetes mellitus with foot ulcer: Secondary | ICD-10-CM | POA: Diagnosis not present

## 2020-02-13 DIAGNOSIS — M0579 Rheumatoid arthritis with rheumatoid factor of multiple sites without organ or systems involvement: Secondary | ICD-10-CM

## 2020-02-13 DIAGNOSIS — L97511 Non-pressure chronic ulcer of other part of right foot limited to breakdown of skin: Secondary | ICD-10-CM

## 2020-02-13 NOTE — Telephone Encounter (Signed)
I received 2 phone calls from them and either myself or jennifer called back and had to leave messages ---so them saying we did not call back is in correct --- jennifer was going to arrange for medical records to be sent from [place] because that is what they requested in writing but I did not talk to anyone

## 2020-02-14 ENCOUNTER — Encounter: Payer: Self-pay | Admitting: Podiatry

## 2020-02-14 ENCOUNTER — Telehealth: Payer: Self-pay | Admitting: Podiatry

## 2020-02-14 ENCOUNTER — Other Ambulatory Visit: Payer: Self-pay | Admitting: Podiatry

## 2020-02-14 MED ORDER — MUPIROCIN 2 % EX OINT
1.0000 | TOPICAL_OINTMENT | Freq: Two times a day (BID) | CUTANEOUS | 2 refills | Status: DC
Start: 2020-02-14 — End: 2020-02-14

## 2020-02-14 NOTE — Telephone Encounter (Signed)
Pt called stating that there was a medication to be sent to her pharmacy

## 2020-02-14 NOTE — Telephone Encounter (Signed)
Pt called states she was to get mupirocin ointment after her visit with Dr. Heber Day visit but it is not at the pharmacy.

## 2020-02-14 NOTE — Progress Notes (Signed)
Subjective:  Patient ID: [name], female    DOB: March 05, 1956,  MRN: [id number]  64 y.o. female is seen today for evaluation of R 2nd toe.  Prescribed wound care regimen: She has been soaking digit and applying betadine solution daily.  Patient relates wound has slightly improved. Erythema has resolved with course of antibiotics.  Patient The patient is having no constitutional symptoms, denying fever, chills, anorexia, or weight loss..  Past Medical History:  Diagnosis Date  . Allergic rhinitis   . Anxiety   . Asthma    hx bronchial asthma at times with upper resp infection  . Depression   . Diabetes mellitus without complication (HCC)    diet controlled no meds  . Fibromyalgia   . GERD (gastroesophageal reflux disease)   . Headache(784.0)    migraine and cluster headaches  . Hyperlipemia   . Hypertension   . IBS (irritable bowel syndrome)   . Menopause   . Neuromuscular disorder ([place]) 2009   hx of fibromyalgia, polyarthralsia (surgery induced)  . Rheumatoid arthritis ([place])    Complicated by osteoarthritis as well.  . Stress incontinence    at times  . Tibial fracture 10/14/2016   evulsion periostial right     Past Surgical History:  Procedure Laterality Date  . ABDOMINAL HYSTERECTOMY  12/1999   complete  . CARPAL TUNNEL RELEASE Right 12/27/2017   Procedure: CARPAL TUNNEL RELEASE;  Surgeon: Roseanne Kaufman, MD;  Location: [facility];  Service: Orthopedics;  Laterality: Right;  . CERVICAL CONIZATION W/BX  june 1990   dysplasia of cervix/used 5Fu cream for 3 months  . CERVICAL LAMINECTOMY  2005 & 2009   X 2   NO ROM PROBLEMS  . JAARS  . DILATION AND CURETTAGE OF UTERUS   [phone]   missed abortion  . ESOPHAGOGASTRODUODENOSCOPY  06/29/2011   Procedure: ESOPHAGOGASTRODUODENOSCOPY (EGD);  Surgeon: Shann Medal, MD;  Location: [facility];  Service: General;  Laterality: N/A;  . FOOT SURGERY  2005   RT  HEEL  . GASTRIC BANDING PORT REVISION  09/24/2011   Procedure: GASTRIC BANDING PORT REVISION;  Surgeon: Pedro Earls, MD;  [facility];  Service: General;  Laterality: N/A;  . GASTRIC ROUX-EN-Y N/A 06/06/2017   Procedure: Conversion from Sleeve to [place];  Surgeon: Johnathan Hausen, MD;  [facility];  Service: General;  Laterality: N/A;  . HYSTEROSCOPY  1999  . LAPAROSCOPIC GASTRIC BANDING  12/30/09  . LAPAROSCOPIC GASTRIC SLEEVE RESECTION N/A 07/02/2013   Procedure: LAPAROSCOPIC GASTRIC SLEEVE RESECTION upper endoscopy;  Surgeon: Pedro Earls, MD;  [facility];  Service: General;  Laterality: N/A;  . LAPAROSCOPIC REPAIR AND REMOVAL OF GASTRIC BAND N/A 07/02/2013   Procedure: LAPAROSCOPIC REMOVAL OF GASTRIC BAND;  Surgeon: Pedro Earls, MD;  [facility];  Service: General;  Laterality: N/A;  . LAPAROSCOPIC REVISION OF GASTRIC BAND  07/03/2012   Procedure: LAPAROSCOPIC REVISION OF GASTRIC BAND;  Surgeon: Pedro Earls, MD;  [facility];  Service: General;  Laterality: N/A;  removal of old lap. band port, replaced with AP standard band  . LAPAROSCOPY  09/24/2011   Procedure: LAPAROSCOPY DIAGNOSTIC;  Surgeon: Pedro Earls, MD;  [facility];  Service: General;  Laterality: N/A;  . LAPAROSCOPY  07/03/2012   Procedure: LAPAROSCOPY DIAGNOSTIC;  Surgeon: Pedro Earls, MD;  [facility];  Service: General;  Laterality: N/A;  Exploratory Laparoscopy   . MESH APPLIED TO LAP PORT  07/03/2012   Procedure: MESH APPLIED TO LAP PORT;  Surgeon: Pedro Earls, MD;  [facility];  Service: General;  Laterality: N/A;  . [place]   X 2  . right knee  1981   ARTHROSCOPY AND ARTHROTOMY  . right knee arthroscopy and arthrotomy  12-1979  . TONSILLECTOMY  1971  . TUBAL LIGATION  1993   WITH C -SECTION     Current Outpatient Medications on File Prior to Visit  Medication Sig  Dispense Refill  . acetaminophen (TYLENOL 8 HOUR ARTHRITIS PAIN) 650 MG CR tablet Take 650 mg by mouth 2 (two) times daily as needed for pain.    Marland Kitchen ALPRAZolam (XANAX) 0.25 MG tablet Take 1 tablet (0.25 mg total) by mouth 2 (two) times daily as needed for anxiety. 20 tablet 0  . Ca Phosphate-Cholecalciferol (CALTRATE GUMMY BITES PO) Take 2 each 2 (two) times daily by mouth.     . Colchicine 0.6 MG CAPS Take 1 capsule by mouth daily.    . cyclobenzaprine (FLEXERIL) 10 MG tablet Take 1 tablet (10 mg total) by mouth 3 (three) times daily as needed. for muscle spams 30 tablet 1  . diclofenac Sodium (VOLTAREN) 1 % GEL Apply 4 g topically 4 (four) times daily. 150 g 1  . DULoxetine (CYMBALTA) 30 MG capsule Take 2 capsules (60 mg total) by mouth daily. 180 capsule 3  . estradiol (VIVELLE-DOT) 0.1 MG/24HR Place 1 patch (0.1 mg total) onto the skin 2 (two) times a week. Monday and Thursday (Patient taking differently: Place 1 patch onto the skin 2 (two) times a week. Mondays and Thursdays) 8 patch 11  . folic acid (FOLVITE) 1 MG tablet Take 1 mg by mouth 3 (three) times daily as needed.     . furosemide (LASIX) 20 MG tablet TAKE 1 TABLET BY MOUTH EVERY MORNING 90 tablet 1  . glucose blood test strip Test blood sugar once daily. Dx Code: E11.9 100 each 12  . HYDROcodone-acetaminophen (NORCO/VICODIN) 5-325 MG tablet Take 1 tablet by mouth every 4 (four) hours as needed.    . hydroxychloroquine (PLAQUENIL) 200 MG tablet 200 mg 2 (two) times daily.     Marland Kitchen leflunomide (ARAVA) 20 MG tablet Take 20 mg by mouth daily.    Marland Kitchen loratadine (CLARITIN) 10 MG tablet Take 10 mg by mouth every evening.     . methocarbamol (ROBAXIN) 500 MG tablet Take 1 tablet (500 mg total) by mouth 4 (four) times daily. 45 tablet 1  . NONFORMULARY OR COMPOUNDED ITEM bp cuff  Dx hypertension 1 each 0  . olmesartan (BENICAR) 5 MG tablet Take 1 tablet (5 mg total) by mouth daily. 90 tablet 3  . Pediatric Multivit-Minerals-C (ONE-A-DAY SCOOBY-DOO  GUMMIES PO) Take 1 tablet 2 (two) times daily by mouth.     . potassium chloride (KLOR-CON) 10 MEQ tablet TAKE 1 TABLET (10 MEQ TOTAL) BY MOUTH DAILY. 90 tablet 1  . pregabalin (LYRICA) 75 MG capsule Take 75 mg by mouth daily.    . pregabalin (LYRICA) 75 MG capsule Take 1 capsule (75 mg total) by mouth daily. 90 capsule 1  . PREMARIN vaginal cream Apply 1 application topically 2 (two) times a week. Mondays & Thursdays.  1  . rosuvastatin (CRESTOR) 20 MG tablet 1 po qd 90 tablet 2  . temazepam (RESTORIL) 30 MG capsule TAKE 1 CAPSULE (30 MG TOTAL)  BY MOUTH AT BEDTIME AS NEEDED FOR SLEEP. 90 capsule 3  . thiamine (VITAMIN B-1) 100 MG tablet Take 100 mg daily by mouth.    . traMADol (ULTRAM) 50 MG tablet tramadol 50 mg tablet  TAKE 1 TABLET BY MOUTH EVERY 8 HOURS AS NEEDED     No current facility-administered medications on file prior to visit.     Allergies  Allergen Reactions  . Isoniazid     Severe SOB  . Nitrofurantoin Shortness Of Breath    REACTION: welts  . Hctz [Hydrochlorothiazide]     rash  . Promethazine Hcl [Promethazine Hcl]     IF GIVEN  IV-hallucinations     CAN TAKE PO PHENERGAN  . Tamiflu [Oseltamivir Phosphate] Nausea And Vomiting    "I vomited within 30 minutes of taking it and was told I cannot ever take it again."  . Macrolides And Ketolides     rash  . Morphine Other (See Comments)    severe vomiting  . Percocet [Oxycodone-Acetaminophen]     REACTION: severe itching. Can take by mouth codeine  . Roxicet [Oxycodone-Acetaminophen]     itching  . Sulfamethoxazole-Trimethoprim     Septra / Bactrim  --REACTION: rash  . Fentanyl Hives and Rash    REACTION: welts     Family History  Problem Relation Age of Onset  . Diabetes Mother   . Stroke Mother   . Breast cancer Mother   . Atrial fibrillation Mother   . Hypertension Mother   . Cancer Mother 53       breast  . Diabetes Father   . Stroke Father 43       2nd 6 month apart  . Hypertension Father   .  Heart attack Father 31       stents  . Dementia Father   . AAA (abdominal aortic aneurysm) Father 45       repair  . CAD Father 67  . Thyroid disease Other   . Heart disease Other   . COPD Other      Social History   Occupational History  . Occupation: Programmer, multimedia: [employer]  Tobacco Use  . Smoking status: Never Smoker  . Smokeless tobacco: Never Used  Vaping Use  . Vaping Use: Never used  Substance and Sexual Activity  . Alcohol use: No    Alcohol/week: 0.0 standard drinks  . Drug use: No  . Sexual activity: Yes    Birth control/protection: None     Immunization History  Administered Date(s) Administered  . H1N1 07/23/2008  . Influenza Whole 04/24/2008  . Influenza,inj,Quad PF,6+ Mos 04/11/2013, 04/04/2019  . Influenza-Unspecified 04/25/2014, 03/13/2015, 03/12/2016  . Moderna SARS-COVID-2 Vaccination 07/11/2019, 07/31/2019  . Pneumococcal Polysaccharide-23 07/04/2013  . Td 01/02/2009  . Tdap 10/04/2018  . Zoster Recombinat (Shingrix) 03/21/2017, 05/25/2017    Objective:  Physical Exam: There were no vitals filed for this visit.   DAWNYEL LEVEN is a pleasant 64 y.o. Caucasian female WD, WN in NAD. AAO x 3.  Vascular Examination: Capillary refill time to digits immediate b/l. Palpable pedal pulses b/l LE. Pedal hair sparse. Lower extremity skin temperature gradient within normal limits. No pain with calf compression b/l. No edema noted b/l lower extremities. No ischemia or gangrene noted b/l lower extremities.  Dermatological Examination: Pedal skin with normal turgor, texture and tone bilaterally. No open wounds bilaterally. No interdigital macerations bilaterally. Toenails 1-5 b/l elongated, discolored, dystrophic, thickened, crumbly with subungual debris  and tenderness to dorsal palpation.     Wound Location: R 2nd toe There is a moderate amount of devitalized tissue present in the wound. Predebridement Wound Measurement:  0.5  x 1.8 x  0.1 cm. Postdebridement Wound Measurement: 0.5  x 2.0 x 0.1 cm. Wound Base: Granular Peri-wound: Calloused Exudate: None: wound tissue dry Blood Loss during debridement: 0 cc('s). Material in wound which inhibits healing/promotes adjacent tissue breakdown:  nonviable hyperkeratosis. Description of tissue removed from ulceration today:  nonviable hyperkeratosis. Sign(s) of clinical bacterial infection: no clinical signs of infection noted on examination today.  Musculoskeletal Examination: Normal muscle strength 5/5 to all lower extremity muscle groups bilaterally. No pain crepitus or joint limitation noted with ROM b/l. Hammertoes noted to the L 2nd toe, L 3rd toe, R 2nd toe, R 3rd toe and R 4th toe.  Neurological Examination: Protective sensation intact 5/5 intact bilaterally with 10g monofilament b/l. Vibratory sensation intact b/l.  Labs: Hemoglobin A1C Latest Ref Rng & Units 01/07/2020 08/02/2019  HGBA1C 4.6 - 6.5 % 6.3 6.3  Some recent data might be hidden   Assessment:  No diagnosis found. Plan:  -Examined patient. -Patient/POA/Family member educated on diagnosis and treatment plan of routine ulcer debridement/wound care.  -Ulceration debridement achieved utilizing sharp excisional debridement with sterile scalpel blade.. Type/amount of devitalized tissue removed: nonviable hyperkeratosis -Today's ulcer size post-debridement: 0.5 x 2.0 x 0.1 cm. -Ulceration cleansed with wound cleanser. triple antibiotic ointment applied to base of ulceration and secured with light dressing. -Wound responded well to today's debridement. -Patient risk factors affecting healing of ulcer: diabetes, immunosuppressive medications, toe deformity, RA -Geri [place]in[place] given written instructions on daily wound care for once daily Mupirocin Ointment to  R 2nd toe ulceration. -Patient to report any pedal injuries to medical professional immediately. -Patient to continue soft, supportive shoe gear  daily. -Patient/POA to call should there be question/concern in the interim.  Return in about 1 week (around 02/20/2020) for diabetic ulcer right second toe.  Marzetta Board, DPM

## 2020-02-15 ENCOUNTER — Ambulatory Visit (HOSPITAL_COMMUNITY)
Admission: RE | Admit: 2020-02-15 | Discharge: 2020-02-15 | Disposition: A | Discharge status: Home / Self Care | Payer: 59 | Source: Ambulatory Visit | Attending: Rheumatology | Admitting: Rheumatology

## 2020-02-15 ENCOUNTER — Other Ambulatory Visit: Payer: Self-pay

## 2020-02-15 DIAGNOSIS — M48061 Spinal stenosis, lumbar region without neurogenic claudication: Secondary | ICD-10-CM | POA: Diagnosis not present

## 2020-02-15 DIAGNOSIS — M549 Dorsalgia, unspecified: Secondary | ICD-10-CM | POA: Insufficient documentation

## 2020-02-15 MED ORDER — GADOBUTROL 1 MMOL/ML IV SOLN
9.0000 mL | Freq: Once | INTRAVENOUS | Status: AC | PRN
  Administered 2020-02-15: 9 mL via INTRAVENOUS

## 2020-02-15 MED FILL — MUPIROCIN 2% OINTMENT: 2 | 14 days supply | Qty: 22 | Fill #0

## 2020-02-20 ENCOUNTER — Other Ambulatory Visit: Payer: Self-pay

## 2020-02-20 ENCOUNTER — Ambulatory Visit (HOSPITAL_COMMUNITY): Payer: 59 | Attending: Cardiology

## 2020-02-20 ENCOUNTER — Ambulatory Visit: Payer: 59 | Admitting: Podiatry

## 2020-02-20 ENCOUNTER — Encounter: Payer: Self-pay | Admitting: Podiatry

## 2020-02-20 DIAGNOSIS — E11621 Type 2 diabetes mellitus with foot ulcer: Secondary | ICD-10-CM | POA: Diagnosis not present

## 2020-02-20 DIAGNOSIS — R0609 Other forms of dyspnea: Secondary | ICD-10-CM

## 2020-02-20 DIAGNOSIS — R011 Cardiac murmur, unspecified: Secondary | ICD-10-CM | POA: Diagnosis not present

## 2020-02-20 DIAGNOSIS — L97511 Non-pressure chronic ulcer of other part of right foot limited to breakdown of skin: Secondary | ICD-10-CM | POA: Diagnosis not present

## 2020-02-20 DIAGNOSIS — R0600 Dyspnea, unspecified: Secondary | ICD-10-CM | POA: Diagnosis not present

## 2020-02-20 LAB — ECHOCARDIOGRAM COMPLETE
Area-P 1/2: 3.99 cm2
P 1/2 time: 333 ms
S' Lateral: 2.3 cm

## 2020-02-20 NOTE — Progress Notes (Signed)
Subjective:  Patient ID: [name], female    DOB: [birth year],  MRN: [id number]  64 y.o. female is seen today for evaluation of R 2nd toe.  Prescribed wound care regimen: Mupirocin Ointment daily.  Patient relates wound is healing.  Patient The patient is having no constitutional symptoms, denying fever, chills, anorexia, or weight loss..  She is sleepy today as she did not rest well last night.  She also had an MRI of her sacrum and SI joint performed.  Past Medical History:  Diagnosis Date  . Allergic rhinitis   . Anxiety   . Asthma    hx bronchial asthma at times with upper resp infection  . Depression   . Diabetes mellitus without complication (HCC)    diet controlled no meds  . Fibromyalgia   . GERD (gastroesophageal reflux disease)   . Headache(784.0)    migraine and cluster headaches  . Hyperlipemia   . Hypertension   . IBS (irritable bowel syndrome)   . Menopause   . Neuromuscular disorder ([place]) 2009   hx of fibromyalgia, polyarthralsia (surgery induced)  . Rheumatoid arthritis ([place])    Complicated by osteoarthritis as well.  . Stress incontinence    at times  . Tibial fracture 10/14/2016   evulsion periostial right     Past Surgical History:  Procedure Laterality Date  . ABDOMINAL HYSTERECTOMY  12/1999   complete  . CARPAL TUNNEL RELEASE Right 12/27/2017   Procedure: CARPAL TUNNEL RELEASE;  Surgeon: Roseanne Kaufman, MD;  Location: [facility];  Service: Orthopedics;  Laterality: Right;  . CERVICAL CONIZATION W/BX  june 1990   dysplasia of cervix/used 5Fu cream for 3 months  . CERVICAL LAMINECTOMY  2005 & 2009   X 2   NO ROM PROBLEMS  . [place]  . DILATION AND CURETTAGE OF UTERUS   [id number]   missed abortion  . ESOPHAGOGASTRODUODENOSCOPY  06/29/2011   Procedure: ESOPHAGOGASTRODUODENOSCOPY (EGD);  Surgeon: Shann Medal, MD;  Location[facility];  Service: General;  Laterality: N/A;  . FOOT  SURGERY  2005   RT HEEL  . GASTRIC BANDING PORT REVISION  09/24/2011   Procedure: GASTRIC BANDING PORT REVISION;  Surgeon: Pedro Earls, MD;  [facility];  Service: General;  Laterality: N/A;  . GASTRIC ROUX-EN-Y N/A 06/06/2017   Procedure: Conversion from Sleeve to [place];  Surgeon: Johnathan Hausen, MD;  [facility];  Service: General;  Laterality: N/A;  . HYSTEROSCOPY  1999  . LAPAROSCOPIC GASTRIC BANDING  12/30/09  . LAPAROSCOPIC GASTRIC SLEEVE RESECTION N/A 07/02/2013   Procedure: LAPAROSCOPIC GASTRIC SLEEVE RESECTION upper endoscopy;  Surgeon: Pedro Earls, MD;  [facility];  Service: General;  Laterality: N/A;  . LAPAROSCOPIC REPAIR AND REMOVAL OF GASTRIC BAND N/A 07/02/2013   Procedure: LAPAROSCOPIC REMOVAL OF GASTRIC BAND;  Surgeon: Pedro Earls, MD;  [facility];  Service: General;  Laterality: N/A;  . LAPAROSCOPIC REVISION OF GASTRIC BAND  07/03/2012   Procedure: LAPAROSCOPIC REVISION OF GASTRIC BAND;  Surgeon: Pedro Earls, MD;  [facility];  Service: General;  Laterality: N/A;  removal of old lap. band port, replaced with AP standard band  . LAPAROSCOPY  09/24/2011   Procedure: LAPAROSCOPY DIAGNOSTIC;  Surgeon: Pedro Earls, MD;  [facility];  Service: General;  Laterality: N/A;  . LAPAROSCOPY  07/03/2012   Procedure: LAPAROSCOPY DIAGNOSTIC;  Surgeon:  Pedro Earls, MD;  [facility];  Service: General;  Laterality: N/A;  Exploratory Laparoscopy   . MESH APPLIED TO LAP PORT  07/03/2012   Procedure: MESH APPLIED TO LAP PORT;  Surgeon: Pedro Earls, MD;  [facility];  Service: General;  Laterality: N/A;  . [place]   X 2  . right knee  1981   ARTHROSCOPY AND ARTHROTOMY  . right knee arthroscopy and arthrotomy  12-1979  . TONSILLECTOMY  1971  . TUBAL LIGATION  1993   WITH C -SECTION     Current Outpatient Medications on File Prior to Visit   Medication Sig Dispense Refill  . acetaminophen (TYLENOL 8 HOUR ARTHRITIS PAIN) 650 MG CR tablet Take 650 mg by mouth 2 (two) times daily as needed for pain.    Marland Kitchen ALPRAZolam (XANAX) 0.25 MG tablet Take 1 tablet (0.25 mg total) by mouth 2 (two) times daily as needed for anxiety. 20 tablet 0  . Ca Phosphate-Cholecalciferol (CALTRATE GUMMY BITES PO) Take 2 each 2 (two) times daily by mouth.     . Colchicine 0.6 MG CAPS Take 1 capsule by mouth daily.    . cyclobenzaprine (FLEXERIL) 10 MG tablet Take 1 tablet (10 mg total) by mouth 3 (three) times daily as needed. for muscle spams 30 tablet 1  . diclofenac Sodium (VOLTAREN) 1 % GEL Apply 4 g topically 4 (four) times daily. 150 g 1  . DULoxetine (CYMBALTA) 30 MG capsule Take 2 capsules (60 mg total) by mouth daily. 180 capsule 3  . estradiol (VIVELLE-DOT) 0.1 MG/24HR Place 1 patch (0.1 mg total) onto the skin 2 (two) times a week. Monday and Thursday (Patient taking differently: Place 1 patch onto the skin 2 (two) times a week. Mondays and Thursdays) 8 patch 11  . folic acid (FOLVITE) 1 MG tablet Take 1 mg by mouth 3 (three) times daily as needed.     . furosemide (LASIX) 20 MG tablet TAKE 1 TABLET BY MOUTH EVERY MORNING 90 tablet 1  . glucose blood test strip Test blood sugar once daily. Dx Code: E11.9 100 each 12  . HYDROcodone-acetaminophen (NORCO/VICODIN) 5-325 MG tablet Take 1 tablet by mouth every 4 (four) hours as needed.    . hydroxychloroquine (PLAQUENIL) 200 MG tablet 200 mg 2 (two) times daily.     Marland Kitchen leflunomide (ARAVA) 20 MG tablet Take 20 mg by mouth daily.    Marland Kitchen loratadine (CLARITIN) 10 MG tablet Take 10 mg by mouth every evening.     . methocarbamol (ROBAXIN) 500 MG tablet Take 1 tablet (500 mg total) by mouth 4 (four) times daily. 45 tablet 1  . mupirocin ointment (BACTROBAN) 2 % Apply 1 application topically 2 (two) times daily. 30 g 2  . NONFORMULARY OR COMPOUNDED ITEM bp cuff  Dx hypertension 1 each 0  . olmesartan (BENICAR) 5 MG  tablet Take 1 tablet (5 mg total) by mouth daily. 90 tablet 3  . Pediatric Multivit-Minerals-C (ONE-A-DAY SCOOBY-DOO GUMMIES PO) Take 1 tablet 2 (two) times daily by mouth.     . potassium chloride (KLOR-CON) 10 MEQ tablet TAKE 1 TABLET (10 MEQ TOTAL) BY MOUTH DAILY. 90 tablet 1  . pregabalin (LYRICA) 75 MG capsule Take 75 mg by mouth daily.    . pregabalin (LYRICA) 75 MG capsule Take 1 capsule (75 mg total) by mouth daily. 90 capsule 1  . PREMARIN vaginal cream Apply 1 application topically 2 (two) times a week. Mondays &  Thursdays.  1  . rosuvastatin (CRESTOR) 20 MG tablet 1 po qd 90 tablet 2  . temazepam (RESTORIL) 30 MG capsule TAKE 1 CAPSULE (30 MG TOTAL) BY MOUTH AT BEDTIME AS NEEDED FOR SLEEP. 90 capsule 3  . thiamine (VITAMIN B-1) 100 MG tablet Take 100 mg daily by mouth.    . traMADol (ULTRAM) 50 MG tablet tramadol 50 mg tablet  TAKE 1 TABLET BY MOUTH EVERY 8 HOURS AS NEEDED     No current facility-administered medications on file prior to visit.     Allergies  Allergen Reactions  . Isoniazid     Severe SOB  . Nitrofurantoin Shortness Of Breath    REACTION: welts  . Hctz [Hydrochlorothiazide]     rash  . Promethazine Hcl [Promethazine Hcl]     IF GIVEN  IV-hallucinations     CAN TAKE PO PHENERGAN  . Tamiflu [Oseltamivir Phosphate] Nausea And Vomiting    "I vomited within 30 minutes of taking it and was told I cannot ever take it again."  . Macrolides And Ketolides     rash  . Morphine Other (See Comments)    severe vomiting  . Percocet [Oxycodone-Acetaminophen]     REACTION: severe itching. Can take by mouth codeine  . Roxicet [Oxycodone-Acetaminophen]     itching  . Sulfamethoxazole-Trimethoprim     Septra / Bactrim  --REACTION: rash  . Fentanyl Hives and Rash    REACTION: welts     Family History  Problem Relation Age of Onset  . Diabetes Mother   . Stroke Mother   . Breast cancer Mother   . Atrial fibrillation Mother   . Hypertension Mother   . Cancer  Mother 43       breast  . Diabetes Father   . Stroke Father 40       2nd 6 month apart  . Hypertension Father   . Heart attack Father 7       stents  . Dementia Father   . AAA (abdominal aortic aneurysm) Father 68       repair  . CAD Father 72  . Thyroid disease Other   . Heart disease Other   . COPD Other      Social History   Occupational History  . Occupation: Programmer, multimedia: [employer]  Tobacco Use  . Smoking status: Never Smoker  . Smokeless tobacco: Never Used  Vaping Use  . Vaping Use: Never used  Substance and Sexual Activity  . Alcohol use: No    Alcohol/week: 0.0 standard drinks  . Drug use: No  . Sexual activity: Yes    Birth control/protection: None     Immunization History  Administered Date(s) Administered  . H1N1 07/23/2008  . Influenza Whole 04/24/2008  . Influenza,inj,Quad PF,6+ Mos 04/11/2013, 04/04/2019  . Influenza-Unspecified 04/25/2014, 03/13/2015, 03/12/2016  . Moderna SARS-COVID-2 Vaccination 07/11/2019, 07/31/2019  . Pneumococcal Polysaccharide-23 07/04/2013  . Td 01/02/2009  . Tdap 10/04/2018  . Zoster Recombinat (Shingrix) 03/21/2017, 05/25/2017    Objective:  Physical Exam: There were no vitals filed for this visit.   [name] is a pleasant 64 y.o. Caucasian female WD, WN in NAD. AAO x 3.  Vascular Examination: Capillary refill time to digits immediate b/l. Palpable pedal pulses b/l LE. Pedal hair sparse. Lower extremity skin temperature gradient within normal limits. No pain with calf compression b/l. No edema noted b/l lower extremities. No ischemia or gangrene noted b/l lower extremities.  Dermatological  Examination: Pedal skin with normal turgor, texture and tone bilaterally. No open wounds bilaterally. No interdigital macerations bilaterally. Toenails 1-5 b/l elongated, discolored, dystrophic, thickened, crumbly with subungual debris and tenderness to dorsal palpation.    Wound Location: R 2nd  toe There is a mild amount of devitalized tissue present in the wound. Wound is filling in well. Now 0.5 cm transverse lesion. No erythema, no edema, no drainage, no fluctuance. Wound Base: Granular Peri-wound: Calloused Exudate: None: wound tissue dry Blood Loss during debridement: <1 cc('s). Material in wound which inhibits healing/promotes adjacent tissue breakdown:  nonviable hyperkeratosis. Description of tissue removed from ulceration today:  nonviable hyperkeratosis. Sign(s) of clinical bacterial infection: no clinical signs of infection noted on examination today.  Musculoskeletal Examination: Normal muscle strength 5/5 to all lower extremity muscle groups bilaterally. No pain crepitus or joint limitation noted with ROM b/l. Hammertoes noted to the L 2nd toe, L 3rd toe, R 2nd toe, R 3rd toe and R 4th toe.  Neurological Examination: Protective sensation intact 5/5 intact bilaterally with 10g monofilament b/l. Vibratory sensation intact b/l.  Labs: Hemoglobin A1C Latest Ref Rng & Units 01/07/2020 08/02/2019  HGBA1C 4.6 - 6.5 % 6.3 6.3  Some recent data might be hidden   Assessment:  No diagnosis found. Plan:  -Examined patient. -Patient/POA/Family member educated on diagnosis and treatment plan of routine ulcer debridement/wound care.  -Ulceration debridement achieved utilizing sharp excisional debridement with sterile scalpel blade.. Type/amount of devitalized tissue removed: nonviable hyperkeratosis -Today's ulcer size post-debridement: 0.1 x 0.5 cm. -Ulceration cleansed with wound cleanser. triple antibiotic ointment applied to base of ulceration and secured with light dressing. -Wound responded well to today's debridement. -Patient risk factors affecting healing of ulcer: diabetes, immunosuppressive medications, toe deformity, RA -Charmaine Downs Tigue to continue once daily wound care of  Mupirocin Ointment to R 2nd toe ulceration. -Patient to report any pedal injuries to medical  professional immediately. -Patient to continue soft, supportive shoe gear daily. -Patient/POA to call should there be question/concern in the interim. -She is to keep appointment scheduled for October. Call office if she has any problems.  Marzetta Board, DPM

## 2020-02-21 NOTE — Telephone Encounter (Signed)
Called [facility] at [phone], spoke with Jeani Hawking, to determine if any additional information is needed from PCP.  Per Jeani Hawking, the status of the long term disability claim is "pending".  They have received medical records and the claim is under peer review.   Patient contacted and informed of status.

## 2020-02-26 DIAGNOSIS — M797 Fibromyalgia: Secondary | ICD-10-CM | POA: Diagnosis not present

## 2020-02-26 DIAGNOSIS — M791 Myalgia, unspecified site: Secondary | ICD-10-CM | POA: Diagnosis not present

## 2020-02-26 DIAGNOSIS — Z79899 Other long term (current) drug therapy: Secondary | ICD-10-CM | POA: Diagnosis not present

## 2020-02-26 DIAGNOSIS — M79643 Pain in unspecified hand: Secondary | ICD-10-CM | POA: Diagnosis not present

## 2020-02-26 DIAGNOSIS — M549 Dorsalgia, unspecified: Secondary | ICD-10-CM | POA: Diagnosis not present

## 2020-02-26 DIAGNOSIS — M0609 Rheumatoid arthritis without rheumatoid factor, multiple sites: Secondary | ICD-10-CM | POA: Diagnosis not present

## 2020-02-26 DIAGNOSIS — G8929 Other chronic pain: Secondary | ICD-10-CM | POA: Diagnosis not present

## 2020-02-26 DIAGNOSIS — M1990 Unspecified osteoarthritis, unspecified site: Secondary | ICD-10-CM | POA: Diagnosis not present

## 2020-02-27 DIAGNOSIS — M0609 Rheumatoid arthritis without rheumatoid factor, multiple sites: Secondary | ICD-10-CM | POA: Diagnosis not present

## 2020-02-28 ENCOUNTER — Ambulatory Visit: Payer: 59 | Admitting: Cardiology

## 2020-02-28 ENCOUNTER — Other Ambulatory Visit: Payer: Self-pay

## 2020-02-28 ENCOUNTER — Encounter: Payer: Self-pay | Admitting: Cardiology

## 2020-02-28 VITALS — BP 140/68 | HR 69 | Ht 71.0 in | Wt 190.0 lb

## 2020-02-28 DIAGNOSIS — I350 Nonrheumatic aortic (valve) stenosis: Secondary | ICD-10-CM | POA: Diagnosis not present

## 2020-02-28 DIAGNOSIS — E785 Hyperlipidemia, unspecified: Secondary | ICD-10-CM

## 2020-02-28 DIAGNOSIS — I10 Essential (primary) hypertension: Secondary | ICD-10-CM

## 2020-02-28 DIAGNOSIS — E1169 Type 2 diabetes mellitus with other specified complication: Secondary | ICD-10-CM | POA: Diagnosis not present

## 2020-02-28 DIAGNOSIS — R0600 Dyspnea, unspecified: Secondary | ICD-10-CM

## 2020-02-28 DIAGNOSIS — R0609 Other forms of dyspnea: Secondary | ICD-10-CM

## 2020-02-28 MED FILL — PREGABALIN 75 MG CAPS: 75 | 90 days supply | Qty: 90 | Fill #0

## 2020-02-28 NOTE — Patient Instructions (Signed)
Medication Instructions:  No changes *If you need a refill on your cardiac medications before your next appointment, please call your pharmacy*   Lab Work: Not needed   Testing/Procedures: Not needed   Follow-Up: At [facility], you and your health needs are our priority.  As part of our continuing mission to provide you with exceptional heart care, we have created designated Provider Care Teams.  These Care Teams include your primary Cardiologist (physician) and Advanced Practice Providers (APPs -  Physician Assistants and Nurse Practitioners) who all work together to provide you with the care you need, when you need it.    Your next appointment:   12 month(s)  The format for your next appointment:   In Person  Provider:   Glenetta Hew, MD   Other Instructions   OKay to do RA treatment

## 2020-02-28 NOTE — Progress Notes (Signed)
Primary Care Provider: Carollee Herter, Alferd Apa, DO Cardiologist: Glenetta Hew, MD Electrophysiologist: None  Rheumatologist: Dr. Kathlene November  Clinic Note: Chief Complaint  Patient presents with  . Follow-up    Test results-echo  . Fatigue    Diffuse aches and pains.   HPI:    [name] is a 64 y.o. female with a history of DM-2, HTN, HLD (s/p GOP with ++ wgt loss), RA (OA) & Fibromyalgia who is being seen today for follow-up of initial consult for evaluation of [facility] [place].  Originally seen at the request of [name], *August 19.Geri Seminole seen for initial consult on July 30 , 2021.  There was concern of a possible murmur heard on exam, and she did not feel like getting an echocardiogram unless she was evaluated by cardiology.  She has not extensive history of both rheumatoid and osteoarthritis as well as fibromyalgia and multiple other complaints.  She has multiple joint pain issues, and is formally morbidly obese now status post gastric bypass in 2018.  Exercise notably limited by arthritis symptoms as well as diabetic foot ulcer.  Unable to walk 2 flights of steps without getting profoundly dyspneic.  Better on flat ground but still has some exertional dyspnea.  Also noted intermittent palpitation episodes.  Was thought that her blood pressure elevation was related to anxiety and ongoing pain.  I chose not to treat her blood pressure. -> Needed baseline cardiac evaluation prior to starting [facility]s[place].  2D echo ordered to evaluate dyspnea and murmur.  Recent Hospitalizations: None  Reviewed  CV studies:    The following studies were reviewed today: (if available, images/films reviewed: From Epic Chart or Care Everywhere) . Transthoracic Echo (02/20/2020): EF 60 to 65%.  Normal LV size and function.  Mild LVH.  GR 1 DD.  Mildly elevated PA pressures.  Mild aortic sclerosis but no stenosis.  Normal RAP/CVP.   Interval History:   ADASHA BOEHME  presents for follow-up with echocardiogram results.  She continues to have her musculoskeletal issues with RA and OA.  She is also been followed for diabetic foot ulcer.  She is extremely deconditioned.  Is still exertional dyspneic but denies any real PND or orthopnea.  Has not had any more palpitations.  She is undergoing evaluation by rheumatology to consider possibility of starting Avsola.  CV Review of Symptoms (Summary) Cardiovascular ROS: positive for - dyspnea on exertion and Still has exercise intolerance.  Sometimes feels her heart rate going fast, but usually associated with pain or activity. negative for - chest pain, edema, irregular heartbeat, orthopnea, palpitations, paroxysmal nocturnal dyspnea, shortness of breath or Despite lightheadedness and dizziness, no syncope or near syncope.  No TIA or amaurosis fugax.  Does not really walk enough to notice any claudication.  The patient DOES NOT have symptoms concerning for COVID-19 infection (fever, chills, cough, or new shortness of breath).  The patient is practicing social distancing & Masking.    REVIEWED OF SYSTEMS   Review of Systems  Constitutional: Positive for malaise/fatigue. Negative for chills, fever and weight loss.  HENT: Negative for nosebleeds.   Gastrointestinal: Positive for abdominal pain and nausea. Negative for blood in stool and melena. Diarrhea: Not necessarily diarrhea but loose stool with IBS.  Genitourinary: Negative for dysuria and hematuria.  Musculoskeletal: Positive for back pain, joint pain (Left knee, right foot, ankle) and myalgias. Negative for falls.       Fibromyalgia and arthritis pains  Neurological: Positive for dizziness and weakness (Generalized). Negative for focal weakness (She can occasionally have focal weakness, but not specific) and headaches.  Psychiatric/Behavioral: Negative for depression and memory loss. The patient is nervous/anxious.     I have reviewed and (if needed) personally  updated the patient's problem list, medications, allergies, past medical and surgical history, social and family history.   PAST MEDICAL HISTORY   Past Medical History:  Diagnosis Date  . Allergic rhinitis   . Anxiety   . Asthma    hx bronchial asthma at times with upper resp infection  . Depression   . Fibromyalgia   . GERD (gastroesophageal reflux disease)   . Headache(784.0)    migraine and cluster headaches  . Hyperlipemia   . Hypertension   . IBS (irritable bowel syndrome)   . Menopause   . Neuromuscular disorder ([place]) 2009   hx of fibromyalgia, polyarthralsia (surgery induced)  . Osteoarthritis   . Rheumatoid arthritis ([place])    Complicated by osteoarthritis as well.  . Rheumatoid arthritis ([place])   . Stress incontinence    at times  . Tibial fracture 10/14/2016   evulsion periostial right  . Type 2 diabetes mellitus with complication, without long-term current use of insulin (HCC)    diet controlled no meds -> however was diagnosed with a diabetic foot ulcer.  --> Patient indicates having a history of rheumatoid arthritis and osteoarthritis as well as fibromyalgia.   PAST SURGICAL HISTORY   Past Surgical History:  Procedure Laterality Date  . ABDOMINAL HYSTERECTOMY  12/1999   complete  . CARPAL TUNNEL RELEASE Right 12/27/2017   Procedure: CARPAL TUNNEL RELEASE;  Surgeon: Roseanne Kaufman, MD;  Location: [facility];  Service: Orthopedics;  Laterality: Right;  . CERVICAL CONIZATION W/BX  june 1990   dysplasia of cervix/used 5Fu cream for 3 months  . CERVICAL LAMINECTOMY  2005 & 2009   X 2   NO ROM PROBLEMS  . [place]  . DILATION AND CURETTAGE OF UTERUS   [phone]   missed abortion  . ESOPHAGOGASTRODUODENOSCOPY  06/29/2011   Procedure: ESOPHAGOGASTRODUODENOSCOPY (EGD);  Surgeon: Shann Medal, MD;  Location: [facility];  Service: General;  Laterality: N/A;  . FOOT SURGERY  2005   RT HEEL  . GASTRIC BANDING PORT  REVISION  09/24/2011   Procedure: GASTRIC BANDING PORT REVISION;  Surgeon: Pedro Earls, MD;  Location: [facility];  Service: General;  Laterality: N/A;  . GASTRIC ROUX-EN-Y N/A 06/06/2017   Procedure: Conversion from Sleeve to Majesty Stehlin;  Surgeon: Johnathan Hausen, MD;  Location: [facility];  Service: General;  Laterality: N/A;  . HYSTEROSCOPY  1999  . LAPAROSCOPIC GASTRIC BANDING  12/30/09  . LAPAROSCOPIC GASTRIC SLEEVE RESECTION N/A 07/02/2013   Procedure: LAPAROSCOPIC GASTRIC SLEEVE RESECTION upper endoscopy;  Surgeon: Pedro Earls, MD;  Location: [facility];  Service: General;  Laterality: N/A;  . LAPAROSCOPIC REPAIR AND REMOVAL OF GASTRIC BAND N/A 07/02/2013   Procedure: LAPAROSCOPIC REMOVAL OF GASTRIC BAND;  Surgeon: Pedro Earls, MD;  Location: [facility];  Service: General;  Laterality: N/A;  . LAPAROSCOPIC REVISION OF GASTRIC BAND  07/03/2012   Procedure: LAPAROSCOPIC REVISION OF GASTRIC BAND;  Surgeon: Pedro Earls, MD;  Location: [facility];  Service: General;  Laterality: N/A;  removal of old lap. band port, replaced with AP standard band  . LAPAROSCOPY  09/24/2011   Procedure: LAPAROSCOPY  DIAGNOSTIC;  Surgeon: Pedro Earls, MD;  Location: [facility];  Service: General;  Laterality: N/A;  . LAPAROSCOPY  07/03/2012   Procedure: LAPAROSCOPY DIAGNOSTIC;  Surgeon: Pedro Earls, MD;  Location: [facility];  Service: General;  Laterality: N/A;  Exploratory Laparoscopy   . MESH APPLIED TO LAP PORT  07/03/2012   Procedure: MESH APPLIED TO LAP PORT;  Surgeon: Pedro Earls, MD;  Location: [facility];  Service: General;  Laterality: N/A;  . [place]   X 2  . right knee  1981   ARTHROSCOPY AND ARTHROTOMY  . right knee arthroscopy and arthrotomy  12-1979  . TONSILLECTOMY  1971  . TUBAL LIGATION  1993   WITH C -SECTION    MEDICATIONS/ALLERGIES   Current Meds  Medication Sig  . acetaminophen (TYLENOL 8 HOUR ARTHRITIS PAIN)  650 MG CR tablet Take 650 mg by mouth 2 (two) times daily as needed for pain.  Marland Kitchen ALPRAZolam (XANAX) 0.25 MG tablet Take 1 tablet (0.25 mg total) by mouth 2 (two) times daily as needed for anxiety.  . Ca Phosphate-Cholecalciferol (CALTRATE GUMMY BITES PO) Take 2 each 2 (two) times daily by mouth.   . Colchicine 0.6 MG CAPS Take 1 capsule by mouth daily.  . cyclobenzaprine (FLEXERIL) 10 MG tablet Take 1 tablet (10 mg total) by mouth 3 (three) times daily as needed. for muscle spams  . diclofenac Sodium (VOLTAREN) 1 % GEL Apply 4 g topically as needed.  . DULoxetine (CYMBALTA) 30 MG capsule Take 2 capsules (60 mg total) by mouth daily.  Marland Kitchen estradiol (VIVELLE-DOT) 0.1 MG/24HR Place 1 patch (0.1 mg total) onto the skin 2 (two) times a week. Monday and Thursday  . folic acid (FOLVITE) 1 MG tablet Take 1 mg by mouth 3 (three) times daily.   . furosemide (LASIX) 20 MG tablet TAKE 1 TABLET BY MOUTH EVERY MORNING  . glucose blood test strip Test blood sugar once daily. Dx Code: E11.9  . HYDROcodone-acetaminophen (NORCO/VICODIN) 5-325 MG tablet Take 1 tablet by mouth every 4 (four) hours as needed.  . hydroxychloroquine (PLAQUENIL) 200 MG tablet 200 mg 2 (two) times daily.   Marland Kitchen leflunomide (ARAVA) 20 MG tablet Take 20 mg by mouth daily.  Marland Kitchen loratadine (CLARITIN) 10 MG tablet Take 10 mg by mouth every evening.   . methocarbamol (ROBAXIN) 500 MG tablet Take 1 tablet (500 mg total) by mouth 4 (four) times daily. (Patient taking differently: Take 500 mg by mouth 4 (four) times daily as needed. )  . mupirocin ointment (BACTROBAN) 2 % Apply 1 application topically 2 (two) times daily.  . NONFORMULARY OR COMPOUNDED ITEM bp cuff  Dx hypertension  . Pediatric Multivit-Minerals-C (ONE-A-DAY SCOOBY-DOO GUMMIES PO) Take 1 tablet 2 (two) times daily by mouth.   . potassium chloride (KLOR-CON) 10 MEQ tablet TAKE 1 TABLET (10 MEQ TOTAL) BY MOUTH DAILY.  Marland Kitchen pregabalin (LYRICA) 75 MG capsule Take 75 mg by mouth daily.  Marland Kitchen  PREMARIN vaginal cream Apply 1 application topically 2 (two) times a week. Mondays & Thursdays.  . rosuvastatin (CRESTOR) 20 MG tablet 1 po qd  . temazepam (RESTORIL) 30 MG capsule TAKE 1 CAPSULE (30 MG TOTAL) BY MOUTH AT BEDTIME AS NEEDED FOR SLEEP.  Marland Kitchen thiamine (VITAMIN B-1) 100 MG tablet Take 100 mg daily by mouth.  . traMADol (ULTRAM) 50 MG tablet tramadol 50 mg tablet  TAKE 1 TABLET BY MOUTH EVERY 8 HOURS AS NEEDED  . [DISCONTINUED] diclofenac Sodium (  VOLTAREN) 1 % GEL Apply 4 g topically 4 (four) times daily. (Patient taking differently: Apply 4 g topically as needed. )  . [DISCONTINUED] olmesartan (BENICAR) 5 MG tablet Take 1 tablet (5 mg total) by mouth daily.  . [DISCONTINUED] pregabalin (LYRICA) 75 MG capsule Take 1 capsule (75 mg total) by mouth daily.    Allergies  Allergen Reactions  . Isoniazid     Severe SOB  . Nitrofurantoin Shortness Of Breath    REACTION: welts  . Hctz [Hydrochlorothiazide]     rash  . Promethazine Hcl [Promethazine Hcl]     IF GIVEN  IV-hallucinations     CAN TAKE PO PHENERGAN  . Tamiflu [Oseltamivir Phosphate] Nausea And Vomiting    "I vomited within 30 minutes of taking it and was told I cannot ever take it again."  . Macrolides And Ketolides     rash  . Morphine Other (See Comments)    severe vomiting  . Percocet [Oxycodone-Acetaminophen]     REACTION: severe itching. Can take by mouth codeine  . Roxicet [Oxycodone-Acetaminophen]     itching  . Sulfamethoxazole-Trimethoprim     Septra / Bactrim  --REACTION: rash  . Fentanyl Hives and Rash    REACTION: welts    SOCIAL HISTORY/FAMILY HISTORY   Social History   Tobacco Use  . Smoking status: Never Smoker  . Smokeless tobacco: Never Used  Vaping Use  . Vaping Use: Never used  Substance Use Topics  . Alcohol use: No    Alcohol/week: 0.0 standard drinks  . Drug use: No   Social History   Social History Narrative  . Not on file   Family History family history includes AAA  (abdominal aortic aneurysm) (age of onset: 68) in her father; Atrial fibrillation in her mother; Breast cancer in her mother; CAD (age of onset: 69) in her father; COPD in an other family member; Cancer (age of onset: 57) in her mother; Dementia in her father; Diabetes in her father and mother; Heart attack (age of onset: 9) in her father; Heart disease in an other family member; Hypertension in her father and mother; Stroke in her mother; Stroke (age of onset: 18) in her father; Thyroid disease in an other family member.   OBJCTIVE -PE, EKG, labs   Wt Readings from Last 3 Encounters:  03/03/20 188 lb 12.8 oz (85.6 kg)  02/28/20 190 lb (86.2 kg)  02/08/20 192 lb 6.4 oz (87.3 kg)    Physical Exam: BP 140/68   Pulse 69   Ht 5\' 11"  (1.803 m)   Wt 190 lb (86.2 kg)   SpO2 98%   BMI 26.50 kg/m  Physical Exam Vitals reviewed.  Constitutional:      General: She is not in acute distress.    Appearance: Normal appearance. She is normal weight. She is not ill-appearing. Toxic: But notably uncomfortable.  HENT:     Head: Normocephalic and atraumatic.  Neck:     Vascular: No carotid bruit, hepatojugular reflux or JVD.  Cardiovascular:     Rate and Rhythm: Normal rate and regular rhythm.  No extrasystoles are present.    Chest Wall: PMI is not displaced.     Pulses: Normal pulses.     Heart sounds: Murmur (1/6 SEM at RUSB--neck) heard.  No friction rub. No gallop.   Chest:     Chest wall: Tenderness: Bilateral costosternal border tenderness.  Abdominal:     Comments: No HSM.  Musculoskeletal:  General: No swelling.     Cervical back: Normal range of motion and neck supple.     Comments: Still has significant musculoskeletal stiffness and discomfort.  Slow antalgic gait.  Generalized pain.  Neurological:     General: No focal deficit present.     Mental Status: She is alert and oriented to person, place, and time.  Psychiatric:        Mood and Affect: Mood normal.         Behavior: Behavior normal.     Comments: Very anxious.  Very talkative in describing her arthritis symptoms.     Adult ECG Report N/A  Recent Labs:    Lab Results  Component Value Date   CHOL 92 01/07/2020   HDL 45.30 01/07/2020   LDLCALC 35 01/07/2020   TRIG 55.0 01/07/2020   CHOLHDL 2 01/07/2020   Lab Results  Component Value Date   CREATININE 0.67 01/07/2020   BUN 12 01/07/2020   NA 140 01/07/2020   K 4.2 01/07/2020   CL 104 01/07/2020   CO2 31 01/07/2020   Lab Results  Component Value Date   TSH 1.78 01/26/2019    ASSESSMENT/PLAN    Problem List Items Addressed This Visit    Senile calcific aortic valve sclerosis (Chronic)    As expected on exam, aortic valve disease noted, thankfully is very minimal.  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  Would probably not need to reevaluate unless murmur worsens.      Essential hypertension - Primary (Chronic)    Still has not been LVP.  With her ongoing pain issues I would be reluctant to be overly aggressive treating.  Continue current dose of Benicar.  She is down to 5 mg.      Hyperlipidemia associated with type 2 diabetes mellitus (HCC) (Chronic)    With her own diet, her lipids are well controlled.      DOE (dyspnea on exertion)    I suspect that her exertional dyspnea is probably related to her musculoskeletal issues.  We talked about other potential testing.  At this point think is probably wise to just allow her musculoskeletal issues to settle a little bit.  If her dyspnea does not improve, then we can consider ischemic evaluation, but I suspect that she is somewhat sedated now by her pains that we would not build to tell the difference.          COVID-19 Education: The signs and symptoms of COVID-19 were discussed with the patient and how to seek care for testing (follow up with PCP or arrange E-visit).   The importance of social distancing and COVID-19 vaccination was discussed  today.  I spent a total of 18 minutes with the patient. >  50% of the time was spent in direct patient consultation.  Additional time spent with chart review  / charting (studies, outside notes, etc): 8 Total Time: 40min   Current medicines are reviewed at length with the patient today.  (+/- concerns) n/a  Notice: This dictation was prepared with Dragon dictation along with smaller phrase technology. Any transcriptional errors that result from this process are unintentional and may not be corrected upon review.  Patient Instructions / Medication Changes & Studies & Tests Ordered   Patient Instructions  Medication Instructions:  No changes *If you need a refill on your cardiac medications before your next appointment, please call your pharmacy*   Lab Work: Not needed   Testing/Procedures: Not needed  Follow-Up: At [facility], you and your health needs are our priority.  As part of our continuing mission to provide you with exceptional heart care, we have created designated Provider Care Teams.  These Care Teams include your primary Cardiologist (physician) and Advanced Practice Providers (APPs -  Physician Assistants and Nurse Practitioners) who all work together to provide you with the care you need, when you need it.    Your next appointment:   12 month(s)  The format for your next appointment:   In Person  Provider:   Glenetta Hew, MD   Other Instructions   OKay to do RA treatment    Studies Ordered:   No orders of the defined types were placed in this encounter.    Glenetta Hew, M.D., M.S. Interventional Cardiologist   Pager # [phone] Phone # [phone] [address] [place], [place] [address]   Thank you for choosing Heartcare at [facility]!!

## 2020-02-29 ENCOUNTER — Ambulatory Visit (INDEPENDENT_AMBULATORY_CARE_PROVIDER_SITE_OTHER): Payer: 59 | Admitting: Psychology

## 2020-02-29 DIAGNOSIS — F4323 Adjustment disorder with mixed anxiety and depressed mood: Secondary | ICD-10-CM | POA: Diagnosis not present

## 2020-03-03 ENCOUNTER — Ambulatory Visit: Payer: 59 | Admitting: Family Medicine

## 2020-03-03 ENCOUNTER — Other Ambulatory Visit: Payer: Self-pay

## 2020-03-03 ENCOUNTER — Encounter: Payer: Self-pay | Admitting: Family Medicine

## 2020-03-03 ENCOUNTER — Other Ambulatory Visit: Payer: Self-pay | Admitting: Family Medicine

## 2020-03-03 VITALS — BP 140/70 | HR 73 | Temp 98.3°F | Resp 18 | Ht 71.0 in | Wt 188.8 lb

## 2020-03-03 DIAGNOSIS — E785 Hyperlipidemia, unspecified: Secondary | ICD-10-CM | POA: Diagnosis not present

## 2020-03-03 DIAGNOSIS — I10 Essential (primary) hypertension: Secondary | ICD-10-CM | POA: Diagnosis not present

## 2020-03-03 DIAGNOSIS — E118 Type 2 diabetes mellitus with unspecified complications: Secondary | ICD-10-CM | POA: Diagnosis not present

## 2020-03-03 DIAGNOSIS — E1169 Type 2 diabetes mellitus with other specified complication: Secondary | ICD-10-CM

## 2020-03-03 MED ORDER — OLMESARTAN MEDOXOMIL 5 MG PO TABS: 10.0000 mg | ORAL_TABLET | Freq: Every day | ORAL | 1 refills | Status: DC

## 2020-03-03 MED FILL — OLMESARTAN MEDOXOMIL 5 MG T: 5 | 90 days supply | Qty: 180 | Fill #0

## 2020-03-03 NOTE — Progress Notes (Signed)
Patient ID: [name], female    DOB: 05-Jul-1956  Age: 64 y.o. MRN: [id number]    Subjective:  Subjective  HPI [name] presents for RA,  Dm , chol and bp.    Pt has seen cardiology and he wants her bp med inc due to fluctuating bp     She is seeing Dr Vertell Limber next month due to spinal stenosis and oa in spine per Rheum.     Review of Systems  Constitutional: Positive for fatigue. Negative for appetite change, diaphoresis and unexpected weight change.  Eyes: Negative for pain, redness and visual disturbance.  Respiratory: Negative for cough, chest tightness, shortness of breath and wheezing.   Cardiovascular: Negative for chest pain, palpitations and leg swelling.  Endocrine: Negative for cold intolerance, heat intolerance, polydipsia, polyphagia and polyuria.  Genitourinary: Negative for difficulty urinating, dysuria and frequency.  Musculoskeletal: Positive for arthralgias, back pain, gait problem and myalgias.  Neurological: Negative for dizziness, light-headedness, numbness and headaches.    History Past Medical History:  Diagnosis Date  . Allergic rhinitis   . Anxiety   . Asthma    hx bronchial asthma at times with upper resp infection  . Depression   . Diabetes mellitus without complication (HCC)    diet controlled no meds  . Fibromyalgia   . GERD (gastroesophageal reflux disease)   . Headache(784.0)    migraine and cluster headaches  . Hyperlipemia   . Hypertension   . IBS (irritable bowel syndrome)   . Menopause   . Neuromuscular disorder ([place]) 2009   hx of fibromyalgia, polyarthralsia (surgery induced)  . Rheumatoid arthritis ([place])    Complicated by osteoarthritis as well.  . Stress incontinence    at times  . Tibial fracture 10/14/2016   evulsion periostial right    She has a past surgical history that includes Cesarean section ([place]); Cervical laminectomy (2005 & 2009); Foot surgery (2005); right knee (1981); Nasal sinus surgery ([place]); Laparoscopic gastric banding (12/30/09); Esophagogastroduodenoscopy (06/29/2011); Hysteroscopy (1999); laparoscopy (09/24/2011); Gastric banding port revision (09/24/2011); Tonsillectomy (1971); Cholecystectomy (1986); Tubal ligation (1993); right knee arthroscopy and arthrotomy ([phone]); Cervical conization w/bx (june 1990); laparoscopy (07/03/2012); Laparoscopic revision of gastric band (07/03/2012); Mesh applied to lap port (07/03/2012); Dilation and curettage of uterus ( april-1989); Laparoscopic repair and removal of gastric band (N/A, 07/02/2013); Laparoscopic gastric sleeve resection (N/A, 07/02/2013); Abdominal hysterectomy (12/1999); Gastric Roux-En-Y (N/A, 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   Her family history includes AAA (abdominal aortic aneurysm) (age of onset: 61) in her father; Atrial fibrillation in her mother; Breast cancer in her mother; CAD (age of onset: 71) in her father; COPD in an other family member; Cancer (age of onset: 7) in her mother; Dementia in her father; Diabetes in her father and mother; Heart attack (age of onset: 32) in her father; Heart disease in an other family member; Hypertension in her father and mother; Stroke in her mother; Stroke (age of onset: 45) in her father; Thyroid disease in an other family member.She reports that she has never smoked. She has never used smokeless tobacco. She reports that she does not drink alcohol and does not use drugs.  Current Outpatient Medications on File Prior to Visit  Medication Sig Dispense Refill  . acetaminophen (TYLENOL 8 HOUR ARTHRITIS PAIN) 650 MG CR tablet Take 650 mg by mouth 2 (two) times daily as needed for pain.    Marland Kitchen ALPRAZolam (XANAX) 0.25 MG tablet Take 1 tablet (  0.25 mg total) by mouth 2 (two) times daily as needed for anxiety. 20 tablet 0  . Ca Phosphate-Cholecalciferol (CALTRATE GUMMY BITES PO) Take 2 each 2 (two) times daily by mouth.     . Colchicine 0.6 MG CAPS Take 1 capsule by  mouth daily.    . cyclobenzaprine (FLEXERIL) 10 MG tablet Take 1 tablet (10 mg total) by mouth 3 (three) times daily as needed. for muscle spams 30 tablet 1  . diclofenac Sodium (VOLTAREN) 1 % GEL Apply 4 g topically as needed.    . DULoxetine (CYMBALTA) 30 MG capsule Take 2 capsules (60 mg total) by mouth daily. 180 capsule 3  . estradiol (VIVELLE-DOT) 0.1 MG/24HR Place 1 patch (0.1 mg total) onto the skin 2 (two) times a week. Monday and Thursday 8 patch 11  . folic acid (FOLVITE) 1 MG tablet Take 1 mg by mouth 3 (three) times daily.     . furosemide (LASIX) 20 MG tablet TAKE 1 TABLET BY MOUTH EVERY MORNING 90 tablet 1  . glucose blood test strip Test blood sugar once daily. Dx Code: E11.9 100 each 12  . HYDROcodone-acetaminophen (NORCO/VICODIN) 5-325 MG tablet Take 1 tablet by mouth every 4 (four) hours as needed.    . hydroxychloroquine (PLAQUENIL) 200 MG tablet 200 mg 2 (two) times daily.     Marland Kitchen leflunomide (ARAVA) 20 MG tablet Take 20 mg by mouth daily.    Marland Kitchen loratadine (CLARITIN) 10 MG tablet Take 10 mg by mouth every evening.     . methocarbamol (ROBAXIN) 500 MG tablet Take 1 tablet (500 mg total) by mouth 4 (four) times daily. (Patient taking differently: Take 500 mg by mouth 4 (four) times daily as needed. ) 45 tablet 1  . mupirocin ointment (BACTROBAN) 2 % Apply 1 application topically 2 (two) times daily. 30 g 2  . NONFORMULARY OR COMPOUNDED ITEM bp cuff  Dx hypertension 1 each 0  . Pediatric Multivit-Minerals-C (ONE-A-DAY SCOOBY-DOO GUMMIES PO) Take 1 tablet 2 (two) times daily by mouth.     . potassium chloride (KLOR-CON) 10 MEQ tablet TAKE 1 TABLET (10 MEQ TOTAL) BY MOUTH DAILY. 90 tablet 1  . pregabalin (LYRICA) 75 MG capsule Take 75 mg by mouth daily.    Marland Kitchen PREMARIN vaginal cream Apply 1 application topically 2 (two) times a week. Mondays & Thursdays.  1  . rosuvastatin (CRESTOR) 20 MG tablet 1 po qd 90 tablet 2  . temazepam (RESTORIL) 30 MG capsule TAKE 1 CAPSULE (30 MG TOTAL) BY  MOUTH AT BEDTIME AS NEEDED FOR SLEEP. 90 capsule 3  . thiamine (VITAMIN B-1) 100 MG tablet Take 100 mg daily by mouth.    . traMADol (ULTRAM) 50 MG tablet tramadol 50 mg tablet  TAKE 1 TABLET BY MOUTH EVERY 8 HOURS AS NEEDED    . UNABLE TO FIND Med Name: Actemra infusion 4 MF     No current facility-administered medications on file prior to visit.     Objective:  Objective  Physical Exam Vitals and nursing note reviewed.  Constitutional:      Appearance: She is well-developed.  HENT:     Head: Normocephalic and atraumatic.  Eyes:     Conjunctiva/sclera: Conjunctivae normal.  Neck:     Thyroid: No thyromegaly.     Vascular: No carotid bruit or JVD.  Cardiovascular:     Rate and Rhythm: Normal rate and regular rhythm.     Heart sounds: Normal heart sounds. No murmur heard.   Pulmonary:  Effort: Pulmonary effort is normal. No respiratory distress.     Breath sounds: Normal breath sounds. No wheezing or rales.  Chest:     Chest wall: No tenderness.  Musculoskeletal:     Cervical back: Normal range of motion and neck supple.  Neurological:     Mental Status: She is alert and oriented to person, place, and time.    BP 140/70 (BP Location: Right Arm, Patient Position: Sitting, Cuff Size: Normal)   Pulse 73   Temp 98.3 F (36.8 C) (Oral)   Resp 18   Ht 5\' 11"  (1.803 m)   Wt 188 lb 12.8 oz (85.6 kg)   SpO2 97%   BMI 26.33 kg/m  Wt Readings from Last 3 Encounters:  03/03/20 188 lb 12.8 oz (85.6 kg)  02/28/20 190 lb (86.2 kg)  02/08/20 192 lb 6.4 oz (87.3 kg)     Lab Results  Component Value Date   WBC 8.9 08/24/2018   HGB 13.8 08/24/2018   HCT 42.4 08/24/2018   PLT 259.0 08/24/2018   GLUCOSE 94 01/07/2020   CHOL 92 01/07/2020   TRIG 55.0 01/07/2020   HDL 45.30 01/07/2020   LDLCALC 35 01/07/2020   ALT 21 01/07/2020   AST 18 01/07/2020   NA 140 01/07/2020   K 4.2 01/07/2020   CL 104 01/07/2020   CREATININE 0.67 01/07/2020   BUN 12 01/07/2020   CO2 31  01/07/2020   TSH 1.78 01/26/2019   HGBA1C 6.3 01/07/2020   MICROALBUR 0.8 08/02/2019    MR SACRUM SI JOINTS W WO CONTRAST  Result Date: 02/15/2020 CLINICAL DATA:  Severe sacral pain. History of fibromyalgia. History of rheumatoid arthritis. Severe pain radiating through the sacrum when standing. EXAM: MRI LUMBAR SPINE WITHOUT AND WITH CONTRAST TECHNIQUE: Multiplanar and multiecho pulse sequences of the lumbar spine were obtained without and with intravenous contrast. CONTRAST:  60mL GADAVIST GADOBUTROL 1 MMOL/ML IV SOLN COMPARISON:  None. FINDINGS: Bones/Joint/Cartilage No fracture or dislocation. Normal alignment. No joint effusion. No SI joint widening or erosive changes. Mild osteoarthritis of the left SI joint with subchondral reactive marrow changes along the anterior sacrum. L4-5: Mild grade 1 anterolisthesis of L4 on L5 secondary to facet disease. Mild-moderate bilateral facet arthropathy. Mild spinal stenosis. L5-S1: Severe bilateral facet arthropathy with subchondral marrow edema. Moderate bilateral foraminal stenosis. Ligaments, Muscles and Tendons Muscles are normal.  No muscle edema or muscle atrophy. Soft tissue No fluid collection or hematoma.  No soft tissue mass. IMPRESSION: 1. No acute osseous injury of the sacrum. 2. Mild osteoarthritis of the left SI joint. Otherwise no evidence of sacroiliitis. 3. At L5-S1 there is severe bilateral facet arthropathy with subchondral marrow edema. Moderate bilateral foraminal stenosis. 4. At L4-5 there is mild grade 1 anterolisthesis of L4 on L5 secondary to facet disease. Mild-moderate bilateral facet arthropathy. Mild spinal stenosis. Electronically Signed   By: Kathreen Devoid   On: 02/15/2020 11:29     Assessment & Plan:  Plan  I have discontinued Charmaine Downs. [place]'s olmesartan. I am also having her start on olmesartan. Additionally, I am having her maintain her loratadine, Ca Phosphate-Cholecalciferol (CALTRATE GUMMY BITES PO), Pediatric  Multivit-Minerals-C (ONE-A-DAY SCOOBY-DOO GUMMIES PO), estradiol, Premarin, thiamine, acetaminophen, glucose blood, cyclobenzaprine, folic acid, hydroxychloroquine, ALPRAZolam, DULoxetine, furosemide, methocarbamol, potassium chloride, leflunomide, NONFORMULARY OR COMPOUNDED ITEM, temazepam, pregabalin, rosuvastatin, Colchicine, HYDROcodone-acetaminophen, traMADol, mupirocin ointment, diclofenac Sodium, and UNABLE TO FIND.  Meds ordered this encounter  Medications  . olmesartan (BENICAR) 5 MG tablet    Sig:  Take 2 tablets (10 mg total) by mouth daily.    Dispense:  180 tablet    Refill:  1    Problem List Items Addressed This Visit      Unprioritized   Controlled type 2 diabetes mellitus with complication, without long-term current use of insulin ([place])    Lab Results  Component Value Date   HGBA1C 6.3 01/07/2020   Controlled con't with diet and exercise      Relevant Medications   olmesartan (BENICAR) 5 MG tablet   Essential hypertension - Primary (Chronic)    Fluctuating bp Inc benicar 10 mg daily      Relevant Medications   olmesartan (BENICAR) 5 MG tablet   Hyperlipidemia associated with type 2 diabetes mellitus (HCC) (Chronic)    Encouraged heart healthy diet, increase exercise, avoid trans fats, consider a krill oil cap daily      Relevant Medications   olmesartan (BENICAR) 5 MG tablet    paperwork filled out Follow-up: Return in about 3 months (around 06/03/2020), or if symptoms worsen or fail to improve.  [name], DO

## 2020-03-03 NOTE — Assessment & Plan Note (Signed)
Fluctuating bp Inc benicar 10 mg daily

## 2020-03-03 NOTE — Assessment & Plan Note (Signed)
Encouraged heart healthy diet, increase exercise, avoid trans fats, consider a krill oil cap daily 

## 2020-03-03 NOTE — Assessment & Plan Note (Addendum)
Lab Results  Component Value Date   HGBA1C 6.3 01/07/2020   Controlled con't with diet and exercise

## 2020-03-03 NOTE — Patient Instructions (Signed)
Rheumatoid Arthritis Rheumatoid arthritis (RA) is a long-term (chronic) disease that causes inflammation in your joints. RA may start slowly. It most often affects the small joints of the hands and feet. Usually, the same joints are affected on both sides of your body. Inflammation from RA can also affect other parts of your body, including your heart, eyes, or lungs. There is no cure for RA, but medicines can help your symptoms and halt or slow down the progression of the disease. What are the causes? RA is an autoimmune disease. When you have an autoimmune disease, your body's defense system (immune system) mistakenly attacks healthy body tissues. The exact cause of RA is not known. What increases the risk? You are more likely to develop this condition if you:  Are a woman.  Have a family history of RA or other autoimmune diseases.  Have a history of smoking.  Are obese.  Have been exposed to pollutants or chemicals. What are the signs or symptoms? The first symptom of this condition may be morning stiffness that lasts longer than 30 minutes.  Symptoms usually start gradually. They are often worse in the morning. As RA progresses, symptoms may include:  Pain, stiffness, swelling, warmth, and tenderness in joints on both sides of your body.  Loss of energy.  Loss of appetite.  Weight loss.  Low-grade fever.  Dry eyes and dry mouth.  Firm lumps (rheumatoid nodules) that grow beneath your skin in areas such as your forearm bones near your elbows and on your hands.  Changes in the appearance of joints (deformity) and loss of joint function. Symptoms of this condition vary from person to person.  Symptoms of RA often come and go.  Sometimes, symptoms get worse for a period of time. These are called flares. How is this diagnosed? This condition is diagnosed based on your symptoms, medical history, and physical exam.  You may have X-rays or an MRI to check for the type of  joint changes that are caused by RA. You may also have blood tests to look for:  Proteins (antibodies) that your immune system may make if you have RA. These include rheumatoid factor (RF) and anti-CCP. ? When blood tests show these proteins, you are said to have "seropositive RA." ? When blood tests do not show these proteins, you may have "seronegative RA."  Inflammation in your blood.  A low number of red blood cells (anemia). How is this treated? The goals of treatment are to relieve pain, reduce inflammation, and slow down or stop joint damage and disability. Treatment may include:  Lifestyle changes. It is important to rest as needed, eat a healthy diet, and exercise.  Medicines. Your health care provider may adjust your medicines every 3 months until treatment goals are reached. Common medicines include: ? Pain relievers (analgesics). ? Corticosteroids and NSAIDs to reduce inflammation. ? Disease-modifying antirheumatic drugs (DMARDs) to try to slow the course of the disease. ? Biologic response modifiers to reduce inflammation and damage.  Physical therapy and occupational therapy.  Surgery, if you have severe joint damage. Joint replacement or fusing of joints may be needed. Your health care provider will work with you to identify the best treatment option for you based on assessment of the overall disease activity in your body. Follow these instructions at home: Activity  Return to your normal activities as told by your health care provider. Ask your health care provider what activities are safe for you.  Rest when you are having a   flare.  Start an exercise program as told by your health care provider. General instructions  Keep all follow-up visits as told by your health care provider. This is important.  Take over-the-counter and prescription medicines only as told by your health care provider. Where to find more information  American College of Rheumatology:  www.rheumatology.org  Arthritis Foundation: www.arthritis.org Contact a health care provider if:  You have a flare-up of RA symptoms.  You have a fever.  You have side effects from your medicines. Get help right away if:  You have chest pain.  You have trouble breathing.  You quickly develop a hot, painful joint that is more severe than your usual joint aches. Summary  Rheumatoid arthritis (RA) is a long-term (chronic) disease that causes inflammation in your joints.  RA is an autoimmune disease.  The goals of treatment are to relieve pain, reduce inflammation, and slow down or stop joint damage and disability. This information is not intended to replace advice given to you by your health care provider. Make sure you discuss any questions you have with your health care provider. Document Revised: 01/09/2019 Document Reviewed: 02/28/2018 Elsevier Patient Education  2020 Elsevier Inc.  

## 2020-03-04 DIAGNOSIS — Z79899 Other long term (current) drug therapy: Secondary | ICD-10-CM | POA: Diagnosis not present

## 2020-03-04 DIAGNOSIS — Z5181 Encounter for therapeutic drug level monitoring: Secondary | ICD-10-CM | POA: Diagnosis not present

## 2020-03-04 DIAGNOSIS — M7918 Myalgia, other site: Secondary | ICD-10-CM | POA: Diagnosis not present

## 2020-03-04 DIAGNOSIS — M461 Sacroiliitis, not elsewhere classified: Secondary | ICD-10-CM | POA: Insufficient documentation

## 2020-03-04 DIAGNOSIS — M545 Low back pain: Secondary | ICD-10-CM | POA: Diagnosis not present

## 2020-03-04 DIAGNOSIS — M2550 Pain in unspecified joint: Secondary | ICD-10-CM | POA: Diagnosis not present

## 2020-03-04 DIAGNOSIS — M797 Fibromyalgia: Secondary | ICD-10-CM | POA: Diagnosis not present

## 2020-03-04 MED FILL — PREMARIN VAGINAL CREAM-APPL: 0.625 | 30 days supply | Qty: 30 | Fill #0

## 2020-03-04 MED FILL — traMADol HCL 50 MG TABS: 50 | 15 days supply | Qty: 15 | Fill #0

## 2020-03-07 ENCOUNTER — Encounter: Payer: Self-pay | Admitting: Cardiology

## 2020-03-07 NOTE — Assessment & Plan Note (Signed)
As expected on exam, aortic valve disease noted, thankfully is very minimal.  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  Would probably not need to reevaluate unless murmur worsens.

## 2020-03-07 NOTE — Assessment & Plan Note (Signed)
Still has not been LVP.  With her ongoing pain issues I would be reluctant to be overly aggressive treating.  Continue current dose of Benicar.  She is down to 5 mg.

## 2020-03-07 NOTE — Assessment & Plan Note (Signed)
I suspect that her exertional dyspnea is probably related to her musculoskeletal issues.  We talked about other potential testing.  At this point think is probably wise to just allow her musculoskeletal issues to settle a little bit.  If her dyspnea does not improve, then we can consider ischemic evaluation, but I suspect that she is somewhat sedated now by her pains that we would not build to tell the difference.

## 2020-03-07 NOTE — Assessment & Plan Note (Signed)
With her own diet, her lipids are well controlled.

## 2020-03-08 ENCOUNTER — Ambulatory Visit: Payer: 59 | Attending: Internal Medicine

## 2020-03-08 DIAGNOSIS — Z23 Encounter for immunization: Secondary | ICD-10-CM

## 2020-03-08 NOTE — Progress Notes (Signed)
   Covid-19 Vaccination Clinic  Name:  MECHEL HAGGARD    MRN: [id number] DOB: 11/19/1955  03/08/2020  Ms. Flanary was observed post Covid-19 immunization for 15 minutes without incident. She was provided with Vaccine Information Sheet and instruction to access the V-Safe system.   Ms. Capito was instructed to call 911 with any severe reactions post vaccine: Marland Kitchen Difficulty breathing  . Swelling of face and throat  . A fast heartbeat  . A bad rash all over body  . Dizziness and weakness

## 2020-03-11 ENCOUNTER — Encounter: Payer: Self-pay | Admitting: Podiatry

## 2020-03-11 ENCOUNTER — Other Ambulatory Visit: Payer: Self-pay

## 2020-03-11 ENCOUNTER — Ambulatory Visit: Payer: 59 | Admitting: Podiatry

## 2020-03-11 VITALS — Temp 97.4°F

## 2020-03-11 DIAGNOSIS — L97511 Non-pressure chronic ulcer of other part of right foot limited to breakdown of skin: Secondary | ICD-10-CM

## 2020-03-11 DIAGNOSIS — M2041 Other hammer toe(s) (acquired), right foot: Secondary | ICD-10-CM | POA: Diagnosis not present

## 2020-03-11 DIAGNOSIS — E11621 Type 2 diabetes mellitus with foot ulcer: Secondary | ICD-10-CM

## 2020-03-11 DIAGNOSIS — M2042 Other hammer toe(s) (acquired), left foot: Secondary | ICD-10-CM | POA: Diagnosis not present

## 2020-03-11 DIAGNOSIS — N951 Menopausal and female climacteric states: Secondary | ICD-10-CM | POA: Insufficient documentation

## 2020-03-11 DIAGNOSIS — N941 Unspecified dyspareunia: Secondary | ICD-10-CM | POA: Insufficient documentation

## 2020-03-11 DIAGNOSIS — F439 Reaction to severe stress, unspecified: Secondary | ICD-10-CM | POA: Insufficient documentation

## 2020-03-11 NOTE — Progress Notes (Signed)
°  Subjective: 64 year old female presents the office today for further evaluation of a recurrent skin fissure, open wound of the right second toe distally.  She states that the area will heal and opened back up.  She recently seen Dr. Adah Perl for this issue.  Prior to that she was seen another podiatrist, Dr. Legrand Como.  She denies any drainage or pus or swelling or redness.  She has a hammertoe. Denies any systemic complaints such as fevers, chills, nausea, vomiting. No acute changes since last appointment, and no other complaints at this time.   Objective: AAO x3, NAD DP/PT pulses palpable bilaterally, CRT less than 3 seconds Hammertoe contracture present resulting hyperkeratotic lesion the distal aspect of the right second toe.  Upon debridement the hyperkeratotic tissue superficial granular wound is evident with no drainage or pus there is no probing, undermining or tunneling.  No clinical signs of infection noted today.  No pain with calf compression, swelling, warmth, erythema  Assessment: Superficial wound right second toe due to hammertoe  Plan: -All treatment options discussed with the patient including all alternatives, risks, complications.  -I sharply debrided the wound today lasting for 312 with scalpel to any complications down to healthy, granular tissue.  Continue antibiotic ointment dressing changes daily.  I dispensed and made her a buttress pad.  Discussed surgical invention but she wants to hold off on that.  Monitor for any signs or symptoms of infection. -Patient encouraged to call the office with any questions, concerns, change in symptoms.   Trula Slade DPM

## 2020-03-14 ENCOUNTER — Ambulatory Visit (INDEPENDENT_AMBULATORY_CARE_PROVIDER_SITE_OTHER): Payer: 59 | Admitting: Psychology

## 2020-03-14 DIAGNOSIS — F4323 Adjustment disorder with mixed anxiety and depressed mood: Secondary | ICD-10-CM | POA: Diagnosis not present

## 2020-03-20 ENCOUNTER — Ambulatory Visit: Payer: 59 | Admitting: Podiatry

## 2020-03-20 ENCOUNTER — Other Ambulatory Visit: Payer: Self-pay

## 2020-03-20 DIAGNOSIS — E11621 Type 2 diabetes mellitus with foot ulcer: Secondary | ICD-10-CM

## 2020-03-20 DIAGNOSIS — L97511 Non-pressure chronic ulcer of other part of right foot limited to breakdown of skin: Secondary | ICD-10-CM | POA: Diagnosis not present

## 2020-03-20 DIAGNOSIS — M2042 Other hammer toe(s) (acquired), left foot: Secondary | ICD-10-CM | POA: Diagnosis not present

## 2020-03-20 DIAGNOSIS — M2041 Other hammer toe(s) (acquired), right foot: Secondary | ICD-10-CM

## 2020-03-20 MED FILL — LEFLUNOMIDE 20 MG TABLET: 20 | 30 days supply | Qty: 30 | Fill #2

## 2020-03-20 MED FILL — HYDROXYCHLOROQUINE 200 MG T: 200 | 30 days supply | Qty: 60 | Fill #2

## 2020-03-25 MED FILL — FLUARIX QUADRIVALENT 0.5 ML: 0.5 | 1 days supply | Qty: 1 | Fill #0

## 2020-03-28 MED FILL — TEMAZEPAM 30 MG CAPSULE: 30 | 90 days supply | Qty: 90 | Fill #2

## 2020-03-30 NOTE — Progress Notes (Signed)
  Subjective: 64 year old female presents the office today for follow-up evaluation of the skin fissure, open wound of the distal aspect of right second toe.  She has been applying mupirocin ointment.  Made her a buttress pad last appointment but she states that it did not work as it will not.  Currently denies any increase in swelling or redness or drainage or pus to the toe and the wound appears to be healing.  Denies any fevers, chills, nausea, vomiting.  No calf pain, chest pain, shortness of breath.  Objective: AAO x3, NAD DP/PT pulses palpable bilaterally, CRT less than 3 seconds Hammertoe contracture present resulting hyperkeratotic lesion the distal aspect of the right second toe.  Granular wound is present although superficial almost completely healed at this time.  There is no edema, erythema or signs of infection.  There is no fluctuation or crepitation.  There is no malodor. No pain with calf compression, swelling, warmth, erythema  Assessment: Superficial wound right second toe due to hammertoe-remain  Plan: -All treatment options discussed with the patient including all alternatives, risks, complications.  -Tissue.  Hyperkeratotic tissue as well as the wound.  The wound appears to be almost completely healed.  Recommended continue with mupirocin ointment dressing changes daily as well as offloading.  Dispensed various offloading pads. -Monitor for any clinical signs or symptoms of infection and directed to call the office immediately should any occur or go to the ER. -Discussed daily foot inspection  Trula Slade DPM

## 2020-03-31 ENCOUNTER — Ambulatory Visit (INDEPENDENT_AMBULATORY_CARE_PROVIDER_SITE_OTHER): Payer: 59 | Admitting: Psychology

## 2020-03-31 DIAGNOSIS — F4323 Adjustment disorder with mixed anxiety and depressed mood: Secondary | ICD-10-CM | POA: Diagnosis not present

## 2020-04-02 DIAGNOSIS — E119 Type 2 diabetes mellitus without complications: Secondary | ICD-10-CM | POA: Diagnosis not present

## 2020-04-02 DIAGNOSIS — R519 Headache, unspecified: Secondary | ICD-10-CM | POA: Diagnosis not present

## 2020-04-02 DIAGNOSIS — M542 Cervicalgia: Secondary | ICD-10-CM | POA: Diagnosis not present

## 2020-04-02 DIAGNOSIS — M5416 Radiculopathy, lumbar region: Secondary | ICD-10-CM | POA: Diagnosis not present

## 2020-04-02 DIAGNOSIS — Z79899 Other long term (current) drug therapy: Secondary | ICD-10-CM | POA: Diagnosis not present

## 2020-04-02 DIAGNOSIS — M47812 Spondylosis without myelopathy or radiculopathy, cervical region: Secondary | ICD-10-CM | POA: Diagnosis not present

## 2020-04-02 DIAGNOSIS — I10 Essential (primary) hypertension: Secondary | ICD-10-CM | POA: Diagnosis not present

## 2020-04-02 DIAGNOSIS — H2513 Age-related nuclear cataract, bilateral: Secondary | ICD-10-CM | POA: Diagnosis not present

## 2020-04-02 DIAGNOSIS — Z6829 Body mass index (BMI) 29.0-29.9, adult: Secondary | ICD-10-CM | POA: Insufficient documentation

## 2020-04-02 DIAGNOSIS — M545 Low back pain: Secondary | ICD-10-CM | POA: Diagnosis not present

## 2020-04-02 DIAGNOSIS — M459 Ankylosing spondylitis of unspecified sites in spine: Secondary | ICD-10-CM | POA: Diagnosis not present

## 2020-04-02 LAB — HM DIABETES EYE EXAM

## 2020-04-07 MED FILL — ROSUVASTATIN CALCIUM 20 MG: 20 | 90 days supply | Qty: 90 | Fill #0

## 2020-04-14 DIAGNOSIS — M0609 Rheumatoid arthritis without rheumatoid factor, multiple sites: Secondary | ICD-10-CM | POA: Diagnosis not present

## 2020-04-15 ENCOUNTER — Ambulatory Visit (INDEPENDENT_AMBULATORY_CARE_PROVIDER_SITE_OTHER): Payer: 59 | Admitting: Psychology

## 2020-04-15 DIAGNOSIS — F4323 Adjustment disorder with mixed anxiety and depressed mood: Secondary | ICD-10-CM

## 2020-04-15 MED FILL — PREGABALIN 75 MG CAPS: 75 | 30 days supply | Qty: 60 | Fill #0

## 2020-04-21 ENCOUNTER — Other Ambulatory Visit (HOSPITAL_BASED_OUTPATIENT_CLINIC_OR_DEPARTMENT_OTHER): Payer: Self-pay

## 2020-04-21 DIAGNOSIS — M7918 Myalgia, other site: Secondary | ICD-10-CM | POA: Diagnosis not present

## 2020-04-21 DIAGNOSIS — M47816 Spondylosis without myelopathy or radiculopathy, lumbar region: Secondary | ICD-10-CM | POA: Diagnosis not present

## 2020-04-21 DIAGNOSIS — M533 Sacrococcygeal disorders, not elsewhere classified: Secondary | ICD-10-CM | POA: Diagnosis not present

## 2020-04-22 DIAGNOSIS — L720 Epidermal cyst: Secondary | ICD-10-CM | POA: Diagnosis not present

## 2020-04-22 DIAGNOSIS — D225 Melanocytic nevi of trunk: Secondary | ICD-10-CM | POA: Diagnosis not present

## 2020-04-22 DIAGNOSIS — L821 Other seborrheic keratosis: Secondary | ICD-10-CM | POA: Diagnosis not present

## 2020-04-22 DIAGNOSIS — D1801 Hemangioma of skin and subcutaneous tissue: Secondary | ICD-10-CM | POA: Diagnosis not present

## 2020-04-22 DIAGNOSIS — L918 Other hypertrophic disorders of the skin: Secondary | ICD-10-CM | POA: Diagnosis not present

## 2020-04-22 MED FILL — traMADol HCL 50 MG TABS: 50 | 15 days supply | Qty: 15 | Fill #0

## 2020-04-23 MED FILL — LEFLUNOMIDE 20 MG TABLET: 20 | 30 days supply | Qty: 30 | Fill #3

## 2020-04-25 ENCOUNTER — Other Ambulatory Visit: Payer: Self-pay

## 2020-04-25 ENCOUNTER — Ambulatory Visit: Payer: 59 | Admitting: Podiatry

## 2020-04-25 DIAGNOSIS — Z8631 Personal history of diabetic foot ulcer: Secondary | ICD-10-CM

## 2020-04-25 DIAGNOSIS — B351 Tinea unguium: Secondary | ICD-10-CM | POA: Diagnosis not present

## 2020-04-25 DIAGNOSIS — M79675 Pain in left toe(s): Secondary | ICD-10-CM

## 2020-04-25 DIAGNOSIS — M2041 Other hammer toe(s) (acquired), right foot: Secondary | ICD-10-CM

## 2020-04-25 DIAGNOSIS — M79674 Pain in right toe(s): Secondary | ICD-10-CM

## 2020-04-25 DIAGNOSIS — E119 Type 2 diabetes mellitus without complications: Secondary | ICD-10-CM

## 2020-04-25 DIAGNOSIS — M2042 Other hammer toe(s) (acquired), left foot: Secondary | ICD-10-CM

## 2020-04-28 ENCOUNTER — Encounter: Payer: Self-pay | Admitting: Podiatry

## 2020-04-28 ENCOUNTER — Ambulatory Visit (INDEPENDENT_AMBULATORY_CARE_PROVIDER_SITE_OTHER): Payer: 59 | Admitting: Psychology

## 2020-04-28 DIAGNOSIS — F4323 Adjustment disorder with mixed anxiety and depressed mood: Secondary | ICD-10-CM

## 2020-04-28 NOTE — Progress Notes (Signed)
Subjective:  Patient ID: [name], female    DOB: 13-Aug-1955,  MRN: [id number]  64 y.o. female presents with preventative diabetic foot care, painful thick toenails that are difficult to trim. Pain interferes with ambulation. Aggravating factors include wearing enclosed shoe gear. Pain is relieved with periodic professional debridement. and follow up of ulceration to the R 2nd toe.    She states she did see Dr. Jacqualyn Posey on last visit and digit was healing. He also provided a buttress pad. She feels a band-aid works for her. She states she continues to apply Aquaphor Ointment to skin surrounding digit to keep it supple and avoid dry, cracked skin.  PCP: [name], DO and last visit was: 03/03/2020.  Review of Systems: Negative except as noted in the HPI.  Past Medical History:  Diagnosis Date  . Allergic rhinitis   . Anxiety   . Asthma    hx bronchial asthma at times with upper resp infection  . Depression   . Fibromyalgia   . GERD (gastroesophageal reflux disease)   . Headache(784.0)    migraine and cluster headaches  . Hyperlipemia   . Hypertension   . IBS (irritable bowel syndrome)   . Menopause   . Neuromuscular disorder ([place]) 2009   hx of fibromyalgia, polyarthralsia (surgery induced)  . Osteoarthritis   . Rheumatoid arthritis ([place])    Complicated by osteoarthritis as well.  . Rheumatoid arthritis ([place])   . Stress incontinence    at times  . Tibial fracture 10/14/2016   evulsion periostial right  . Type 2 diabetes mellitus with complication, without long-term current use of insulin ([facility])    diet controlled no meds -> however was diagnosed with a diabetic foot ulcer.   Past Surgical History:  Procedure Laterality Date  . ABDOMINAL HYSTERECTOMY  12/1999   complete  . CARPAL TUNNEL RELEASE Right 12/27/2017   Procedure: CARPAL TUNNEL RELEASE;  Surgeon: Roseanne Kaufman, MD;  Location: [place] [facility];  Service: Orthopedics;  Laterality: Right;  . CERVICAL CONIZATION  W/BX  june 1990   dysplasia of cervix/used 5Fu cream for 3 months  . CERVICAL LAMINECTOMY  2005 & 2009   X 2   NO ROM PROBLEMS  . [place]  . DILATION AND CURETTAGE OF UTERUS   [phone]   missed abortion  . ESOPHAGOGASTRODUODENOSCOPY  06/29/2011   Procedure: ESOPHAGOGASTRODUODENOSCOPY (EGD);  Surgeon: Shann Medal, MD;  Location: [facility];  Service: General;  Laterality: N/A;  . FOOT SURGERY  2005   RT HEEL  . GASTRIC BANDING PORT REVISION  09/24/2011   Procedure: GASTRIC BANDING PORT REVISION;  Surgeon: Pedro Earls, MD;  Location: [facility];  Service: General;  Laterality: N/A;  . GASTRIC ROUX-EN-Y N/A 06/06/2017   Procedure: Conversion from Sleeve to [place];  Surgeon: Johnathan Hausen, MD;  Location: [facility];  Service: General;  Laterality: N/A;  . HYSTEROSCOPY  1999  . LAPAROSCOPIC GASTRIC BANDING  12/30/09  . LAPAROSCOPIC GASTRIC SLEEVE RESECTION N/A 07/02/2013   Procedure: LAPAROSCOPIC GASTRIC SLEEVE RESECTION upper endoscopy;  Surgeon: Pedro Earls, MD;  Location: [facility];  Service: General;  Laterality: N/A;  . LAPAROSCOPIC REPAIR AND REMOVAL OF GASTRIC BAND N/A 07/02/2013   Procedure: LAPAROSCOPIC REMOVAL OF GASTRIC BAND;  Surgeon: Pedro Earls, MD;  Location: [facility];  Service: General;  Laterality: N/A;  . LAPAROSCOPIC REVISION OF GASTRIC BAND  07/03/2012   Procedure: LAPAROSCOPIC REVISION OF GASTRIC BAND;  Surgeon: Pedro Earls, MD;  Location: [facility];  Service: General;  Laterality: N/A;  removal of old lap. band port, replaced with AP standard band  . LAPAROSCOPY  09/24/2011   Procedure: LAPAROSCOPY DIAGNOSTIC;  Surgeon: Pedro Earls, MD;  Location: [facility];  Service: General;  Laterality: N/A;  . LAPAROSCOPY  07/03/2012   Procedure: LAPAROSCOPY DIAGNOSTIC;  Surgeon: Pedro Earls, MD;  Location: [facility];  Service: General;  Laterality: N/A;  Exploratory  Laparoscopy   . MESH APPLIED TO LAP PORT  07/03/2012   Procedure: MESH APPLIED TO LAP PORT;  Surgeon: Pedro Earls, MD;  Location: [facility];  Service: General;  Laterality: N/A;  . [place]   X 2  . right knee  1981   ARTHROSCOPY AND ARTHROTOMY  . right knee arthroscopy and arthrotomy  12-1979  . TONSILLECTOMY  1971  . TUBAL LIGATION  1993   WITH C -SECTION   Patient Active Problem List   Diagnosis Date Noted  . Menopausal symptom 03/11/2020  . Stress 03/11/2020  . Unspecified dyspareunia (CODE) 03/11/2020  . Bilateral sacroiliitis ([place]) 03/04/2020  . Senile calcific aortic valve sclerosis 02/08/2020  . DOE (dyspnea on exertion) 02/08/2020  . Newly recognized heart murmur 01/07/2020  . Acquired trigger finger of right ring finger 10/25/2019  . Pain in right knee 08/24/2019  . Rheumatoid arthritis involving multiple sites with positive rheumatoid factor ([place]) 08/02/2019  . Primary osteoarthritis of both knees 08/02/2019  . PTSD (post-traumatic stress disorder) 02/08/2019  . Dog bite of arm, right, initial encounter 10/05/2018  . Hyperlipidemia associated with type 2 diabetes mellitus ([place]) 08/24/2018  . Symptomatic abdominal panniculus 12/12/2017  . Carpal tunnel syndrome of left wrist 11/28/2017  . Pain in right hand 11/08/2017  . Cervical radiculopathy 10/06/2017  . 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 Keratosis 12/04/2014  . Onychocryptosis 08/21/2014  . Onychomycosis 05/21/2014  . Fissured skin 01/22/2014  . Fissure in skin of foot 12/18/2013  . Porokeratosis 12/18/2013  . Pain in lower limb 11/21/2013  . Ingrown nail 07/20/2013  . 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 Obesity (BMI 30-39.9) 06/25/2013  . GERD (gastroesophageal reflux disease) 08/20/2011  . Lapband APL + [facility] repair June 2011-Major revision Dec 2013 06/29/2011  . ABDOMINAL PAIN, ACUTE 08/01/2010  . VITAMIN B12 DEFICIENCY 04/01/2010  . BARIATRIC  SURGERY STATUS 01/29/2010  . ONYCHOMYCOSIS, BILATERAL 06/30/2009  . STRESS INCONTINENCE 06/10/2009  . ACUTE PHARYNGITIS 04/29/2009  . COUGH 04/27/2009  . BRONCHITIS, ACUTE 04/16/2009  . NAUSEA 04/08/2009  . DIARRHEA 04/08/2009  . MORBID OBESITY 03/03/2009  . SKIN RASH 11/18/2008  . UNSPECIFIED VITAMIN D DEFICIENCY 07/15/2008  . OTHER SPECIFIED ANEMIAS 07/15/2008  . Pain in joint, multiple sites 06/13/2008  . Myalgia and myositis, unspecified 06/13/2008  . BACK PAIN, LUMBAR 02/14/2008  . ACUTE SINUSITIS, UNSPECIFIED 09/01/2007  . CELLULITIS 08/12/2007  . Depression with anxiety 06/09/2007  . DEPRESSION 06/09/2007  . Controlled type 2 diabetes mellitus with complication, without long-term current use of insulin ([place]) 01/04/2007  . Essential hypertension 01/04/2007  . ALLERGIC RHINITIS 01/04/2007  . HEADACHE 01/04/2007    Current Outpatient Medications:  .  acetaminophen (TYLENOL 8 HOUR ARTHRITIS PAIN) 650 MG CR tablet, Take 650 mg by mouth 2 (two) times daily as needed for pain., Disp: , Rfl:  .  ALPRAZolam (XANAX) 0.25 MG tablet, Take 1 tablet (  0.25 mg total) by mouth 2 (two) times daily as needed for anxiety., Disp: 20 tablet, Rfl: 0 .  Ca Phosphate-Cholecalciferol (CALTRATE GUMMY BITES PO), Take 2 each 2 (two) times daily by mouth. , Disp: , Rfl:  .  Colchicine 0.6 MG CAPS, Take 1 capsule by mouth daily., Disp: , Rfl:  .  cyclobenzaprine (FLEXERIL) 10 MG tablet, Take 1 tablet (10 mg total) by mouth 3 (three) times daily as needed. for muscle spams, Disp: 30 tablet, Rfl: 1 .  diclofenac Sodium (VOLTAREN) 1 % GEL, Apply 4 g topically as needed., Disp: , Rfl:  .  DULoxetine (CYMBALTA) 30 MG capsule, Take 2 capsules (60 mg total) by mouth daily., Disp: 180 capsule, Rfl: 3 .  estradiol (VIVELLE-DOT) 0.1 MG/24HR, Place 1 patch (0.1 mg total) onto the skin 2 (two) times a week. Monday and Thursday, Disp: 8 patch, Rfl: 11 .  folic acid (FOLVITE) 1 MG tablet, Take 1 mg by mouth 3 (three)  times daily. , Disp: , Rfl:  .  furosemide (LASIX) 20 MG tablet, TAKE 1 TABLET BY MOUTH EVERY MORNING, Disp: 90 tablet, Rfl: 1 .  glucose blood test strip, Test blood sugar once daily. Dx Code: E11.9, Disp: 100 each, Rfl: 12 .  HYDROcodone-acetaminophen (NORCO/VICODIN) 5-325 MG tablet, Take 1 tablet by mouth every 4 (four) hours as needed., Disp: , Rfl:  .  hydroxychloroquine (PLAQUENIL) 200 MG tablet, 200 mg 2 (two) times daily. , Disp: , Rfl:  .  leflunomide (ARAVA) 20 MG tablet, Take 20 mg by mouth daily., Disp: , Rfl:  .  loratadine (CLARITIN) 10 MG tablet, Take 10 mg by mouth every evening. , Disp: , Rfl:  .  methocarbamol (ROBAXIN) 500 MG tablet, Take 1 tablet (500 mg total) by mouth 4 (four) times daily. (Patient taking differently: Take 500 mg by mouth 4 (four) times daily as needed. ), Disp: 45 tablet, Rfl: 1 .  mupirocin ointment (BACTROBAN) 2 %, Apply 1 application topically 2 (two) times daily., Disp: 30 g, Rfl: 2 .  NONFORMULARY OR COMPOUNDED ITEM, bp cuff  Dx hypertension, Disp: 1 each, Rfl: 0 .  olmesartan (BENICAR) 5 MG tablet, Take 2 tablets (10 mg total) by mouth daily., Disp: 180 tablet, Rfl: 1 .  Pediatric Multivit-Minerals-C (ONE-A-DAY SCOOBY-DOO GUMMIES PO), Take 1 tablet 2 (two) times daily by mouth. , Disp: , Rfl:  .  potassium chloride (KLOR-CON) 10 MEQ tablet, TAKE 1 TABLET (10 MEQ TOTAL) BY MOUTH DAILY., Disp: 90 tablet, Rfl: 1 .  pregabalin (LYRICA) 75 MG capsule, Take 75 mg by mouth daily., Disp: , Rfl:  .  PREMARIN vaginal cream, Apply 1 application topically 2 (two) times a week. Mondays & Thursdays., Disp: , Rfl: 1 .  rosuvastatin (CRESTOR) 20 MG tablet, 1 po qd, Disp: 90 tablet, Rfl: 2 .  temazepam (RESTORIL) 30 MG capsule, TAKE 1 CAPSULE (30 MG TOTAL) BY MOUTH AT BEDTIME AS NEEDED FOR SLEEP., Disp: 90 capsule, Rfl: 3 .  thiamine (VITAMIN B-1) 100 MG tablet, Take 100 mg daily by mouth., Disp: , Rfl:  .  traMADol (ULTRAM) 50 MG tablet, tramadol 50 mg tablet  TAKE 1  TABLET BY MOUTH EVERY 8 HOURS AS NEEDED, Disp: , Rfl:  .  UNABLE TO FIND, Med Name: Actemra infusion 4 MF, Disp: , Rfl:  Allergies  Allergen Reactions  . Isoniazid     Severe SOB  . Nitrofurantoin Shortness Of Breath    REACTION: welts  . Hctz [Hydrochlorothiazide]  rash  . Oseltamivir Phosphate Nausea And Vomiting    "I vomited within 30 minutes of taking it and was told I cannot ever take it again."  . Promethazine Hcl [Promethazine Hcl]     IF GIVEN  IV-hallucinations     CAN TAKE PO PHENERGAN  . Sulfa Antibiotics Rash  . Tamiflu [Oseltamivir Phosphate] Nausea And Vomiting    "I vomited within 30 minutes of taking it and was told I cannot ever take it again."  . Macrolides And Ketolides     rash  . Morphine Other (See Comments)    severe vomiting  . Percocet [Oxycodone-Acetaminophen]     REACTION: severe itching. Can take by mouth codeine  . Roxicet [Oxycodone-Acetaminophen]     itching  . Sulfamethoxazole-Trimethoprim     Septra / Bactrim  --REACTION: rash  . Fentanyl Hives and Rash    REACTION: welts   Social History   Tobacco Use  Smoking Status Never Smoker  Smokeless Tobacco Never Used    Objective:  There were no vitals filed for this visit.      Constitutional Patient is a pleasant 64 y.o. Caucasian female in NAD. AAO x 3.  Vascular Capillary fill time to digits <3 seconds b/l lower extremities. Palpable pedal pulses b/l LE. Pedal hair sparse. Lower extremity skin temperature gradient within normal limits. No pain with calf compression b/l. No edema noted b/l lower extremities. No ischemia or gangrene noted b/l lower extremities. No cyanosis or clubbing noted.  Neurologic Normal speech. Protective sensation intact 5/5 intact bilaterally with 10g monofilament b/l. Vibratory sensation intact b/l.  Dermatologic Pedal skin with normal turgor, texture and tone bilaterally. No interdigital macerations bilaterally. Toenails 1-5 b/l elongated, discolored,  dystrophic, thickened, crumbly with subungual debris and tenderness to dorsal palpation.  Orthopedic: Normal muscle strength 5/5 to all lower extremity muscle groups bilaterally. Hammertoes noted to the L 2nd toe, L 3rd toe, R 3rd toe, R 4th toe and R 5th toe.   Hemoglobin A1C Latest Ref Rng & Units 01/07/2020 08/02/2019  HGBA1C 4.6 - 6.5 % 6.3 6.3  Some recent data might be hidden   Assessment:   1. Pain due to onychomycosis of toenails of both feet   2. Acquired hammertoes of both feet   3. Healed diabetic foot ulcer   4. Controlled type 2 diabetes mellitus without complication, without long-term current use of insulin ([place])    Plan:  Patient was evaluated and treated and all questions answered.  Onychomycosis with pain -Nails palliatively debridement as below. -Educated on self-care  Procedure: Nail Debridement Rationale: Pain Type of Debridement: manual, sharp debridement. Instrumentation: Nail nipper, rotary burr. Number of Nails: 10  -Examined patient. -Continue diabetic foot care principles. -Ulcer distal tip right 2nd digit is completely resolved. Continue daily protection of digit due to digital contracture. She related understanding. -Toenails 1-5 b/l were debrided in length and girth with sterile nail nippers and dremel without iatrogenic bleeding.  -Patient to report any pedal injuries to medical professional immediately. -Will call patient when disability paperwork is complete. -Patient to continue soft, supportive shoe gear daily. -Patient/POA to call should there be question/concern in the interim.  Return in about 9 weeks (around 06/27/2020).  Marzetta Board, DPM

## 2020-05-12 DIAGNOSIS — M0609 Rheumatoid arthritis without rheumatoid factor, multiple sites: Secondary | ICD-10-CM | POA: Diagnosis not present

## 2020-05-13 DIAGNOSIS — M47817 Spondylosis without myelopathy or radiculopathy, lumbosacral region: Secondary | ICD-10-CM | POA: Diagnosis not present

## 2020-05-13 DIAGNOSIS — M4306 Spondylolysis, lumbar region: Secondary | ICD-10-CM | POA: Insufficient documentation

## 2020-05-14 ENCOUNTER — Other Ambulatory Visit (HOSPITAL_BASED_OUTPATIENT_CLINIC_OR_DEPARTMENT_OTHER): Payer: Self-pay

## 2020-05-14 MED FILL — CYCLOBENZAPRINE HCL 10 MG T: 10 | 10 days supply | Qty: 30 | Fill #0

## 2020-05-15 ENCOUNTER — Ambulatory Visit (INDEPENDENT_AMBULATORY_CARE_PROVIDER_SITE_OTHER): Payer: 59 | Admitting: Psychology

## 2020-05-15 DIAGNOSIS — F4323 Adjustment disorder with mixed anxiety and depressed mood: Secondary | ICD-10-CM

## 2020-05-23 MED FILL — METHOCARBAMOL 500 MG TABS: 500 | 30 days supply | Qty: 90 | Fill #1

## 2020-05-28 DIAGNOSIS — G8929 Other chronic pain: Secondary | ICD-10-CM | POA: Diagnosis not present

## 2020-05-28 DIAGNOSIS — Z79899 Other long term (current) drug therapy: Secondary | ICD-10-CM | POA: Diagnosis not present

## 2020-05-28 DIAGNOSIS — M797 Fibromyalgia: Secondary | ICD-10-CM | POA: Diagnosis not present

## 2020-05-28 DIAGNOSIS — M791 Myalgia, unspecified site: Secondary | ICD-10-CM | POA: Diagnosis not present

## 2020-05-28 DIAGNOSIS — R748 Abnormal levels of other serum enzymes: Secondary | ICD-10-CM | POA: Diagnosis not present

## 2020-05-28 DIAGNOSIS — M79643 Pain in unspecified hand: Secondary | ICD-10-CM | POA: Diagnosis not present

## 2020-05-28 DIAGNOSIS — M549 Dorsalgia, unspecified: Secondary | ICD-10-CM | POA: Diagnosis not present

## 2020-05-28 DIAGNOSIS — M1990 Unspecified osteoarthritis, unspecified site: Secondary | ICD-10-CM | POA: Diagnosis not present

## 2020-05-28 DIAGNOSIS — M0609 Rheumatoid arthritis without rheumatoid factor, multiple sites: Secondary | ICD-10-CM | POA: Diagnosis not present

## 2020-05-30 MED FILL — DULoxetine HCL 30 MG CPEP: 30 | 90 days supply | Qty: 180 | Fill #1

## 2020-05-30 MED FILL — HYDROXYCHLOROQUINE 200 MG T: 200 | 30 days supply | Qty: 60 | Fill #3

## 2020-06-02 ENCOUNTER — Ambulatory Visit (INDEPENDENT_AMBULATORY_CARE_PROVIDER_SITE_OTHER): Payer: 59 | Admitting: Psychology

## 2020-06-02 ENCOUNTER — Other Ambulatory Visit: Payer: Self-pay | Admitting: Family Medicine

## 2020-06-02 DIAGNOSIS — F4323 Adjustment disorder with mixed anxiety and depressed mood: Secondary | ICD-10-CM | POA: Diagnosis not present

## 2020-06-02 MED FILL — DICLOFENAC SODIUM 1% GEL: 1 | 13 days supply | Qty: 200 | Fill #0

## 2020-06-03 ENCOUNTER — Other Ambulatory Visit (HOSPITAL_BASED_OUTPATIENT_CLINIC_OR_DEPARTMENT_OTHER): Payer: Self-pay | Admitting: Rheumatology

## 2020-06-03 MED FILL — LEFLUNOMIDE 10 MG TABS: 10 | 30 days supply | Qty: 30 | Fill #0

## 2020-06-04 DIAGNOSIS — D692 Other nonthrombocytopenic purpura: Secondary | ICD-10-CM | POA: Diagnosis not present

## 2020-06-04 DIAGNOSIS — L814 Other melanin hyperpigmentation: Secondary | ICD-10-CM | POA: Diagnosis not present

## 2020-06-04 DIAGNOSIS — L821 Other seborrheic keratosis: Secondary | ICD-10-CM | POA: Diagnosis not present

## 2020-06-04 DIAGNOSIS — L578 Other skin changes due to chronic exposure to nonionizing radiation: Secondary | ICD-10-CM | POA: Diagnosis not present

## 2020-06-04 DIAGNOSIS — D1801 Hemangioma of skin and subcutaneous tissue: Secondary | ICD-10-CM | POA: Diagnosis not present

## 2020-06-04 DIAGNOSIS — D225 Melanocytic nevi of trunk: Secondary | ICD-10-CM | POA: Diagnosis not present

## 2020-06-06 MED FILL — OLMESARTAN MEDOXOMIL 5 MG T: 5 | 90 days supply | Qty: 180 | Fill #1

## 2020-06-09 ENCOUNTER — Ambulatory Visit: Payer: 59 | Admitting: Family Medicine

## 2020-06-09 ENCOUNTER — Other Ambulatory Visit: Payer: Self-pay | Admitting: Family Medicine

## 2020-06-09 ENCOUNTER — Other Ambulatory Visit: Payer: Self-pay

## 2020-06-09 ENCOUNTER — Encounter: Payer: Self-pay | Admitting: Family Medicine

## 2020-06-09 DIAGNOSIS — E118 Type 2 diabetes mellitus with unspecified complications: Secondary | ICD-10-CM

## 2020-06-09 DIAGNOSIS — E785 Hyperlipidemia, unspecified: Secondary | ICD-10-CM | POA: Diagnosis not present

## 2020-06-09 DIAGNOSIS — M5412 Radiculopathy, cervical region: Secondary | ICD-10-CM

## 2020-06-09 DIAGNOSIS — M2550 Pain in unspecified joint: Secondary | ICD-10-CM | POA: Diagnosis not present

## 2020-06-09 DIAGNOSIS — G4700 Insomnia, unspecified: Secondary | ICD-10-CM

## 2020-06-09 DIAGNOSIS — I10 Essential (primary) hypertension: Secondary | ICD-10-CM | POA: Diagnosis not present

## 2020-06-09 DIAGNOSIS — E1169 Type 2 diabetes mellitus with other specified complication: Secondary | ICD-10-CM | POA: Diagnosis not present

## 2020-06-09 DIAGNOSIS — M0579 Rheumatoid arthritis with rheumatoid factor of multiple sites without organ or systems involvement: Secondary | ICD-10-CM

## 2020-06-09 DIAGNOSIS — F418 Other specified anxiety disorders: Secondary | ICD-10-CM | POA: Diagnosis not present

## 2020-06-09 MED ORDER — FUROSEMIDE 20 MG PO TABS: ORAL_TABLET | ORAL | 1 refills | Status: DC

## 2020-06-09 MED ORDER — TEMAZEPAM 30 MG PO CAPS: 30.0000 mg | ORAL_CAPSULE | Freq: Every evening | ORAL | 3 refills | Status: DC | PRN

## 2020-06-09 MED ORDER — OLMESARTAN MEDOXOMIL 5 MG PO TABS: 10.0000 mg | ORAL_TABLET | Freq: Every day | ORAL | 1 refills | Status: DC

## 2020-06-09 MED FILL — FUROSEMIDE 20 MG TAB: 20 | 90 days supply | Qty: 90 | Fill #0

## 2020-06-09 NOTE — Assessment & Plan Note (Signed)
Encouraged heart healthy diet, increase exercise, avoid trans fats, consider a krill oil cap daily 

## 2020-06-09 NOTE — Progress Notes (Signed)
Patient ID: [name], female    DOB: 11-16-1955  Age: 64 y.o. MRN: [id number]    Subjective:  Subjective  HPI SHALITA NOTTE presents for f/u fmla , bp , chol , dm.  She is seeing rheum for RA.  Pt is also seeing pain management for chronic pain    No other complaints Pt walks very slowly due to pain  Unable to lift, walk long distances or sit for any length of time due to pain  Review of Systems  Constitutional: Negative for appetite change, diaphoresis, fatigue and unexpected weight change.  Eyes: Negative for pain, redness and visual disturbance.  Respiratory: Negative for cough, chest tightness, shortness of breath and wheezing.   Cardiovascular: Negative for chest pain, palpitations and leg swelling.  Endocrine: Negative for cold intolerance, heat intolerance, polydipsia, polyphagia and polyuria.  Genitourinary: Negative for difficulty urinating, dysuria and frequency.  Musculoskeletal: Positive for arthralgias, back pain, gait problem, joint swelling and myalgias.  Neurological: Negative for dizziness, light-headedness, numbness and headaches.    History Past Medical History:  Diagnosis Date  . Allergic rhinitis   . Anxiety   . Asthma    hx bronchial asthma at times with upper resp infection  . Depression   . Fibromyalgia   . GERD (gastroesophageal reflux disease)   . Headache(784.0)    migraine and cluster headaches  . Hyperlipemia   . Hypertension   . IBS (irritable bowel syndrome)   . Menopause   . Neuromuscular disorder ([place]) 2009   hx of fibromyalgia, polyarthralsia (surgery induced)  . Osteoarthritis   . Rheumatoid arthritis ([place])    Complicated by osteoarthritis as well.  . Rheumatoid arthritis ([place])   . Stress incontinence    at times  . Tibial fracture 10/14/2016   evulsion periostial right  . Type 2 diabetes mellitus with complication, without long-term current use of insulin ([facility])    diet controlled no meds -> however was diagnosed with a  diabetic foot ulcer.    She has a past surgical history that includes Cesarean section ([place]); Cervical laminectomy (2005 & 2009); Foot surgery (2005); right knee (1981); Nasal sinus surgery ([place]); Laparoscopic gastric banding (12/30/09); Esophagogastroduodenoscopy (06/29/2011); Hysteroscopy (1999); laparoscopy (09/24/2011); Gastric banding port revision (09/24/2011); Tonsillectomy (1971); Cholecystectomy (1986); Tubal ligation (1993); right knee arthroscopy and arthrotomy ([id number]); Cervical conization w/bx (june 1990); laparoscopy (07/03/2012); Laparoscopic revision of gastric band (07/03/2012); Mesh applied to lap port (07/03/2012); Dilation and curettage of uterus ( april-1989); Laparoscopic repair and removal of gastric band (N/A, 07/02/2013); Laparoscopic gastric sleeve resection (N/A, 07/02/2013); Abdominal hysterectomy (12/1999); Gastric Roux-En-Y (N/A, 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   Her family history includes AAA (abdominal aortic aneurysm) (age of onset: 104) in her father; Atrial fibrillation in her mother; Breast cancer in her mother; CAD (age of onset: 75) in her father; COPD in an other family member; Cancer (age of onset: 64) in her mother; Dementia in her father; Diabetes in her father and mother; Heart attack (age of onset: 14) in her father; Heart disease in an other family member; Hypertension in her father and mother; Stroke in her mother; Stroke (age of onset: 33) in her father; Thyroid disease in an other family member.She reports that she has never smoked. She has never used smokeless tobacco. She reports that she does not drink alcohol and does not use drugs.  Current Outpatient Medications on File Prior to Visit  Medication Sig Dispense Refill  . acetaminophen (TYLENOL  8 HOUR ARTHRITIS PAIN) 650 MG CR tablet Take 650 mg by mouth 2 (two) times daily as needed for pain.    Marland Kitchen ALPRAZolam (XANAX) 0.25 MG tablet Take 1 tablet (0.25 mg total) by  mouth 2 (two) times daily as needed for anxiety. 20 tablet 0  . Ca Phosphate-Cholecalciferol (CALTRATE GUMMY BITES PO) Take 2 each 2 (two) times daily by mouth.     . Colchicine 0.6 MG CAPS Take 1 capsule by mouth daily.    . cyclobenzaprine (FLEXERIL) 10 MG tablet Take 1 tablet (10 mg total) by mouth 3 (three) times daily as needed. for muscle spams 30 tablet 1  . diclofenac Sodium (VOLTAREN) 1 % GEL Apply 4 g topically 4 (four) times daily. 200 g 3  . DULoxetine (CYMBALTA) 30 MG capsule Take 2 capsules (60 mg total) by mouth daily. 180 capsule 3  . estradiol (VIVELLE-DOT) 0.1 MG/24HR Place 1 patch (0.1 mg total) onto the skin 2 (two) times a week. Monday and Thursday 8 patch 11  . glucose blood test strip Test blood sugar once daily. Dx Code: E11.9 100 each 12  . HYDROcodone-acetaminophen (NORCO/VICODIN) 5-325 MG tablet Take 1 tablet by mouth every 4 (four) hours as needed.    . hydroxychloroquine (PLAQUENIL) 200 MG tablet 200 mg 2 (two) times daily.     Marland Kitchen leflunomide (ARAVA) 20 MG tablet Take 20 mg by mouth daily.    Marland Kitchen loratadine (CLARITIN) 10 MG tablet Take 10 mg by mouth every evening.     . methocarbamol (ROBAXIN) 500 MG tablet Take 1 tablet (500 mg total) by mouth 4 (four) times daily. (Patient taking differently: Take 500 mg by mouth 4 (four) times daily as needed. ) 45 tablet 1  . mupirocin ointment (BACTROBAN) 2 % Apply 1 application topically 2 (two) times daily. 30 g 2  . NONFORMULARY OR COMPOUNDED ITEM bp cuff  Dx hypertension 1 each 0  . Pediatric Multivit-Minerals-C (ONE-A-DAY SCOOBY-DOO GUMMIES PO) Take 1 tablet 2 (two) times daily by mouth.     . potassium chloride (KLOR-CON) 10 MEQ tablet TAKE 1 TABLET (10 MEQ TOTAL) BY MOUTH DAILY. 90 tablet 1  . pregabalin (LYRICA) 75 MG capsule Take 75 mg by mouth daily.    Marland Kitchen PREMARIN vaginal cream Apply 1 application topically 2 (two) times a week. Mondays & Thursdays.  1  . rosuvastatin (CRESTOR) 20 MG tablet 1 po qd 90 tablet 2  . thiamine  (VITAMIN B-1) 100 MG tablet Take 100 mg daily by mouth.    . traMADol (ULTRAM) 50 MG tablet tramadol 50 mg tablet  TAKE 1 TABLET BY MOUTH EVERY 8 HOURS AS NEEDED    . UNABLE TO FIND Med Name: Actemra infusion 4 MF     No current facility-administered medications on file prior to visit.     Objective:  Objective  Physical Exam Vitals and nursing note reviewed.  Constitutional:      Appearance: She is well-developed.  HENT:     Head: Normocephalic and atraumatic.  Eyes:     Conjunctiva/sclera: Conjunctivae normal.  Neck:     Thyroid: No thyromegaly.     Vascular: No carotid bruit or JVD.  Cardiovascular:     Rate and Rhythm: Normal rate and regular rhythm.     Heart sounds: Normal heart sounds. No murmur heard.   Pulmonary:     Effort: Pulmonary effort is normal. No respiratory distress.     Breath sounds: Normal breath sounds. No wheezing or rales.  Chest:     Chest wall: No tenderness.  Musculoskeletal:        General: Swelling and tenderness present.     Cervical back: Normal range of motion and neck supple.  Neurological:     Mental Status: She is alert and oriented to person, place, and time.    BP 140/70 (BP Location: Right Arm, Patient Position: Sitting, Cuff Size: Large)   Pulse 86   Temp 98.8 F (37.1 C) (Oral)   Resp 18   Ht 5\' 11"  (1.803 m)   Wt 211 lb 12.8 oz (96.1 kg)   SpO2 95%   BMI 29.54 kg/m  Wt Readings from Last 3 Encounters:  06/09/20 211 lb 12.8 oz (96.1 kg)  03/03/20 188 lb 12.8 oz (85.6 kg)  02/28/20 190 lb (86.2 kg)     Lab Results  Component Value Date   WBC 8.9 08/24/2018   HGB 13.8 08/24/2018   HCT 42.4 08/24/2018   PLT 259.0 08/24/2018   GLUCOSE 94 01/07/2020   CHOL 92 01/07/2020   TRIG 55.0 01/07/2020   HDL 45.30 01/07/2020   LDLCALC 35 01/07/2020   ALT 21 01/07/2020   AST 18 01/07/2020   NA 140 01/07/2020   K 4.2 01/07/2020   CL 104 01/07/2020   CREATININE 0.67 01/07/2020   BUN 12 01/07/2020   CO2 31 01/07/2020   TSH  1.78 01/26/2019   HGBA1C 6.3 01/07/2020   MICROALBUR 0.8 08/02/2019    MR SACRUM SI JOINTS W WO CONTRAST  Result Date: 02/15/2020 CLINICAL DATA:  Severe sacral pain. History of fibromyalgia. History of rheumatoid arthritis. Severe pain radiating through the sacrum when standing. EXAM: MRI LUMBAR SPINE WITHOUT AND WITH CONTRAST TECHNIQUE: Multiplanar and multiecho pulse sequences of the lumbar spine were obtained without and with intravenous contrast. CONTRAST:  74mL GADAVIST GADOBUTROL 1 MMOL/ML IV SOLN COMPARISON:  None. FINDINGS: Bones/Joint/Cartilage No fracture or dislocation. Normal alignment. No joint effusion. No SI joint widening or erosive changes. Mild osteoarthritis of the left SI joint with subchondral reactive marrow changes along the anterior sacrum. L4-5: Mild grade 1 anterolisthesis of L4 on L5 secondary to facet disease. Mild-moderate bilateral facet arthropathy. Mild spinal stenosis. L5-S1: Severe bilateral facet arthropathy with subchondral marrow edema. Moderate bilateral foraminal stenosis. Ligaments, Muscles and Tendons Muscles are normal.  No muscle edema or muscle atrophy. Soft tissue No fluid collection or hematoma.  No soft tissue mass. IMPRESSION: 1. No acute osseous injury of the sacrum. 2. Mild osteoarthritis of the left SI joint. Otherwise no evidence of sacroiliitis. 3. At L5-S1 there is severe bilateral facet arthropathy with subchondral marrow edema. Moderate bilateral foraminal stenosis. 4. At L4-5 there is mild grade 1 anterolisthesis of L4 on L5 secondary to facet disease. Mild-moderate bilateral facet arthropathy. Mild spinal stenosis. Electronically Signed   By: Kathreen Devoid   On: 02/15/2020 11:29     Assessment & Plan:  Plan  I have discontinued Charmaine Downs. Ruderman's folic acid. I am also having her maintain her loratadine, Ca Phosphate-Cholecalciferol (CALTRATE GUMMY BITES PO), Pediatric Multivit-Minerals-C (ONE-A-DAY SCOOBY-DOO GUMMIES PO), estradiol, Premarin,  thiamine, acetaminophen, glucose blood, cyclobenzaprine, hydroxychloroquine, ALPRAZolam, DULoxetine, methocarbamol, potassium chloride, leflunomide, NONFORMULARY OR COMPOUNDED ITEM, pregabalin, rosuvastatin, Colchicine, HYDROcodone-acetaminophen, traMADol, mupirocin ointment, UNABLE TO FIND, diclofenac Sodium, olmesartan, furosemide, and temazepam.  Meds ordered this encounter  Medications  . olmesartan (BENICAR) 5 MG tablet    Sig: Take 2 tablets (10 mg total) by mouth daily.    Dispense:  180 tablet  Refill:  1  . furosemide (LASIX) 20 MG tablet    Sig: TAKE 1 TABLET BY MOUTH EVERY MORNING    Dispense:  90 tablet    Refill:  1  . temazepam (RESTORIL) 30 MG capsule    Sig: Take 1 capsule (30 mg total) by mouth at bedtime as needed for sleep.    Dispense:  90 capsule    Refill:  3    Problem List Items Addressed This Visit      Unprioritized   Cervical radiculopathy   Relevant Medications   temazepam (RESTORIL) 30 MG capsule   Essential hypertension (Chronic)   Relevant Medications   olmesartan (BENICAR) 5 MG tablet   furosemide (LASIX) 20 MG tablet    Other Visit Diagnoses    Insomnia, unspecified type       Relevant Medications   temazepam (RESTORIL) 30 MG capsule      Follow-up: Return in about 3 months (around 09/08/2020), or if symptoms worsen or fail to improve, for hypertension, hyperlipidemia, diabetes II.  [name], DO

## 2020-06-09 NOTE — Assessment & Plan Note (Signed)
Well controlled, no changes to meds. Encouraged heart healthy diet such as the DASH diet and exercise as tolerated.  °

## 2020-06-09 NOTE — Assessment & Plan Note (Signed)
Cont cymbalta-- also helping with pain

## 2020-06-09 NOTE — Patient Instructions (Signed)
Carbohydrate Counting for Diabetes Mellitus, Adult  Carbohydrate counting is a method of keeping track of how many carbohydrates you eat. Eating carbohydrates naturally increases the amount of sugar (glucose) in the blood. Counting how many carbohydrates you eat helps keep your blood glucose within normal limits, which helps you manage your diabetes (diabetes mellitus). It is important to know how many carbohydrates you can safely have in each meal. This is different for every person. A diet and nutrition specialist (registered dietitian) can help you make a meal plan and calculate how many carbohydrates you should have at each meal and snack. Carbohydrates are found in the following foods:  Grains, such as breads and cereals.  Dried beans and soy products.  Starchy vegetables, such as potatoes, peas, and corn.  Fruit and fruit juices.  Milk and yogurt.  Sweets and snack foods, such as cake, cookies, candy, chips, and soft drinks. How do I count carbohydrates? There are two ways to count carbohydrates in food. You can use either of the methods or a combination of both. Reading "Nutrition Facts" on packaged food The "Nutrition Facts" list is included on the labels of almost all packaged foods and beverages in the [place] It includes:  The serving size.  Information about nutrients in each serving, including the grams (g) of carbohydrate per serving. To use the "Nutrition Facts":  Decide how many servings you will have.  Multiply the number of servings by the number of carbohydrates per serving.  The resulting number is the total amount of carbohydrates that you will be having. Learning standard serving sizes of other foods When you eat carbohydrate foods that are not packaged or do not include "Nutrition Facts" on the label, you need to measure the servings in order to count the amount of carbohydrates:  Measure the foods that you will eat with a food scale or measuring cup, if  needed.  Decide how many standard-size servings you will eat.  Multiply the number of servings by 15. Most carbohydrate-rich foods have about 15 g of carbohydrates per serving. ? For example, if you eat 8 oz (170 g) of strawberries, you will have eaten 2 servings and 30 g of carbohydrates (2 servings x 15 g = 30 g).  For foods that have more than one food mixed, such as soups and casseroles, you must count the carbohydrates in each food that is included. The following list contains standard serving sizes of common carbohydrate-rich foods. Each of these servings has about 15 g of carbohydrates:   hamburger bun or  English muffin.   oz (15 mL) syrup.   oz (14 g) jelly.  1 slice of bread.  1 six-inch tortilla.  3 oz (85 g) cooked rice or pasta.  4 oz (113 g) cooked dried beans.  4 oz (113 g) starchy vegetable, such as peas, corn, or potatoes.  4 oz (113 g) hot cereal.  4 oz (113 g) mashed potatoes or  of a large baked potato.  4 oz (113 g) canned or frozen fruit.  4 oz (120 mL) fruit juice.  4-6 crackers.  6 chicken nuggets.  6 oz (170 g) unsweetened dry cereal.  6 oz (170 g) plain fat-free yogurt or yogurt sweetened with artificial sweeteners.  8 oz (240 mL) milk.  8 oz (170 g) fresh fruit or one small piece of fruit.  24 oz (680 g) popped popcorn. Example of carbohydrate counting Sample meal  3 oz (85 g) chicken breast.  6 oz (170 g)   brown rice.  4 oz (113 g) corn.  8 oz (240 mL) milk.  8 oz (170 g) strawberries with sugar-free whipped topping. Carbohydrate calculation 1. Identify the foods that contain carbohydrates: ? Rice. ? Corn. ? Milk. ? Strawberries. 2. Calculate how many servings you have of each food: ? 2 servings rice. ? 1 serving corn. ? 1 serving milk. ? 1 serving strawberries. 3. Multiply each number of servings by 15 g: ? 2 servings rice x 15 g = 30 g. ? 1 serving corn x 15 g = 15 g. ? 1 serving milk x 15 g = 15 g. ? 1  serving strawberries x 15 g = 15 g. 4. Add together all of the amounts to find the total grams of carbohydrates eaten: ? 30 g + 15 g + 15 g + 15 g = 75 g of carbohydrates total. Summary  Carbohydrate counting is a method of keeping track of how many carbohydrates you eat.  Eating carbohydrates naturally increases the amount of sugar (glucose) in the blood.  Counting how many carbohydrates you eat helps keep your blood glucose within normal limits, which helps you manage your diabetes.  A diet and nutrition specialist (registered dietitian) can help you make a meal plan and calculate how many carbohydrates you should have at each meal and snack. This information is not intended to replace advice given to you by your health care provider. Make sure you discuss any questions you have with your health care provider. Document Revised: 01/20/2017 Document Reviewed: 12/10/2015 Elsevier Patient Education  2020 [employer]vier Inc.  

## 2020-06-09 NOTE — Assessment & Plan Note (Signed)
Lab Results  Component Value Date   HGBA1C 6.3 01/07/2020   Controlled  F/u in Jan

## 2020-06-09 NOTE — Assessment & Plan Note (Signed)
Per rheum And pain management  fmla filled out

## 2020-06-09 NOTE — Assessment & Plan Note (Signed)
Per pain management.  

## 2020-06-10 ENCOUNTER — Other Ambulatory Visit (HOSPITAL_BASED_OUTPATIENT_CLINIC_OR_DEPARTMENT_OTHER): Payer: Self-pay

## 2020-06-10 DIAGNOSIS — M7918 Myalgia, other site: Secondary | ICD-10-CM | POA: Diagnosis not present

## 2020-06-10 DIAGNOSIS — M533 Sacrococcygeal disorders, not elsewhere classified: Secondary | ICD-10-CM | POA: Diagnosis not present

## 2020-06-10 DIAGNOSIS — M47816 Spondylosis without myelopathy or radiculopathy, lumbar region: Secondary | ICD-10-CM | POA: Diagnosis not present

## 2020-06-10 DIAGNOSIS — M2550 Pain in unspecified joint: Secondary | ICD-10-CM | POA: Diagnosis not present

## 2020-06-10 DIAGNOSIS — M0609 Rheumatoid arthritis without rheumatoid factor, multiple sites: Secondary | ICD-10-CM | POA: Diagnosis not present

## 2020-06-10 MED FILL — GABAPENTIN 300 MG CAPSULE: 300 | 30 days supply | Qty: 90 | Fill #0

## 2020-06-10 MED FILL — PREGABALIN 75 MG CAPS: 75 | 90 days supply | Qty: 90 | Fill #1

## 2020-06-10 MED FILL — CYCLOBENZAPRINE HCL 10 MG T: 10 | 30 days supply | Qty: 30 | Fill #0

## 2020-06-16 ENCOUNTER — Encounter: Payer: Self-pay | Admitting: Family Medicine

## 2020-06-16 ENCOUNTER — Other Ambulatory Visit: Payer: Self-pay | Admitting: Family Medicine

## 2020-06-16 ENCOUNTER — Ambulatory Visit (INDEPENDENT_AMBULATORY_CARE_PROVIDER_SITE_OTHER): Payer: 59 | Admitting: Psychology

## 2020-06-16 DIAGNOSIS — F4323 Adjustment disorder with mixed anxiety and depressed mood: Secondary | ICD-10-CM

## 2020-06-16 DIAGNOSIS — R42 Dizziness and giddiness: Secondary | ICD-10-CM

## 2020-06-16 DIAGNOSIS — G8929 Other chronic pain: Secondary | ICD-10-CM

## 2020-06-16 NOTE — Telephone Encounter (Signed)
Referral placed.

## 2020-06-20 ENCOUNTER — Encounter: Payer: Self-pay | Admitting: Neurology

## 2020-06-24 ENCOUNTER — Other Ambulatory Visit (HOSPITAL_BASED_OUTPATIENT_CLINIC_OR_DEPARTMENT_OTHER): Payer: Self-pay

## 2020-06-24 DIAGNOSIS — M461 Sacroiliitis, not elsewhere classified: Secondary | ICD-10-CM | POA: Diagnosis not present

## 2020-06-24 MED FILL — AMITRIPTYLINE HCL 25 MG TAB: 25 | 90 days supply | Qty: 90 | Fill #0

## 2020-06-25 DIAGNOSIS — M47812 Spondylosis without myelopathy or radiculopathy, cervical region: Secondary | ICD-10-CM | POA: Diagnosis not present

## 2020-06-25 DIAGNOSIS — M545 Low back pain, unspecified: Secondary | ICD-10-CM | POA: Diagnosis not present

## 2020-06-25 DIAGNOSIS — M5416 Radiculopathy, lumbar region: Secondary | ICD-10-CM | POA: Diagnosis not present

## 2020-06-25 DIAGNOSIS — G8929 Other chronic pain: Secondary | ICD-10-CM | POA: Diagnosis not present

## 2020-06-25 DIAGNOSIS — Z6830 Body mass index (BMI) 30.0-30.9, adult: Secondary | ICD-10-CM | POA: Diagnosis not present

## 2020-06-25 DIAGNOSIS — I10 Essential (primary) hypertension: Secondary | ICD-10-CM | POA: Diagnosis not present

## 2020-06-25 DIAGNOSIS — M542 Cervicalgia: Secondary | ICD-10-CM | POA: Diagnosis not present

## 2020-06-25 DIAGNOSIS — M459 Ankylosing spondylitis of unspecified sites in spine: Secondary | ICD-10-CM | POA: Diagnosis not present

## 2020-06-26 ENCOUNTER — Other Ambulatory Visit (HOSPITAL_BASED_OUTPATIENT_CLINIC_OR_DEPARTMENT_OTHER): Payer: Self-pay | Admitting: Family Medicine

## 2020-06-26 DIAGNOSIS — Z1231 Encounter for screening mammogram for malignant neoplasm of breast: Secondary | ICD-10-CM

## 2020-06-27 MED FILL — TEMAZEPAM 30 MG CAPSULE: 30 | 90 days supply | Qty: 90 | Fill #0

## 2020-06-30 ENCOUNTER — Ambulatory Visit: Payer: 59 | Admitting: Psychology

## 2020-07-02 MED FILL — ROSUVASTATIN CALCIUM 20 MG: 20 | 90 days supply | Qty: 90 | Fill #1

## 2020-07-07 ENCOUNTER — Other Ambulatory Visit (HOSPITAL_BASED_OUTPATIENT_CLINIC_OR_DEPARTMENT_OTHER): Payer: Self-pay | Admitting: Pain Medicine

## 2020-07-07 MED FILL — NORTRIPTYLINE HCL 10 MG CAP: 10 | 30 days supply | Qty: 30 | Fill #0

## 2020-07-08 DIAGNOSIS — M0609 Rheumatoid arthritis without rheumatoid factor, multiple sites: Secondary | ICD-10-CM | POA: Diagnosis not present

## 2020-07-14 ENCOUNTER — Ambulatory Visit (INDEPENDENT_AMBULATORY_CARE_PROVIDER_SITE_OTHER): Payer: 59 | Admitting: Psychology

## 2020-07-14 DIAGNOSIS — F4323 Adjustment disorder with mixed anxiety and depressed mood: Secondary | ICD-10-CM

## 2020-07-15 ENCOUNTER — Encounter: Payer: Self-pay | Admitting: Family Medicine

## 2020-07-17 ENCOUNTER — Encounter: Payer: Self-pay | Admitting: Family Medicine

## 2020-07-18 ENCOUNTER — Encounter (HOSPITAL_BASED_OUTPATIENT_CLINIC_OR_DEPARTMENT_OTHER): Payer: 59

## 2020-07-18 MED FILL — LEFLUNOMIDE 10 MG TABS: 10 | 30 days supply | Qty: 30 | Fill #1

## 2020-07-18 MED FILL — METHOCARBAMOL 500 MG TABLET: 500 | 30 days supply | Qty: 90 | Fill #2

## 2020-07-18 MED FILL — HYDROXYCHLOROQUINE 200 MG T: 200 | 30 days supply | Qty: 60 | Fill #4

## 2020-07-21 ENCOUNTER — Other Ambulatory Visit: Payer: Self-pay

## 2020-07-21 ENCOUNTER — Encounter: Payer: Self-pay | Admitting: Family Medicine

## 2020-07-21 ENCOUNTER — Telehealth: Payer: Self-pay | Admitting: Cardiology

## 2020-07-21 ENCOUNTER — Ambulatory Visit: Payer: 59 | Admitting: Family Medicine

## 2020-07-21 ENCOUNTER — Other Ambulatory Visit: Payer: Self-pay | Admitting: Family Medicine

## 2020-07-21 VITALS — BP 140/74 | HR 76 | Temp 98.2°F | Ht 71.0 in | Wt 210.0 lb

## 2020-07-21 DIAGNOSIS — I10 Essential (primary) hypertension: Secondary | ICD-10-CM | POA: Diagnosis not present

## 2020-07-21 DIAGNOSIS — G8929 Other chronic pain: Secondary | ICD-10-CM | POA: Diagnosis not present

## 2020-07-21 DIAGNOSIS — R55 Syncope and collapse: Secondary | ICD-10-CM

## 2020-07-21 DIAGNOSIS — E1140 Type 2 diabetes mellitus with diabetic neuropathy, unspecified: Secondary | ICD-10-CM | POA: Diagnosis not present

## 2020-07-21 DIAGNOSIS — R519 Headache, unspecified: Secondary | ICD-10-CM

## 2020-07-21 DIAGNOSIS — L03113 Cellulitis of right upper limb: Secondary | ICD-10-CM | POA: Insufficient documentation

## 2020-07-21 DIAGNOSIS — R42 Dizziness and giddiness: Secondary | ICD-10-CM | POA: Diagnosis not present

## 2020-07-21 LAB — CBC WITH DIFFERENTIAL/PLATELET
Basophils Absolute: 0 10*3/uL (ref 0.0–0.1)
Basophils Relative: 0.8 % (ref 0.0–3.0)
Eosinophils Absolute: 0.2 10*3/uL (ref 0.0–0.7)
Eosinophils Relative: 3.6 % (ref 0.0–5.0)
HCT: 41.2 % (ref 36.0–46.0)
Hemoglobin: 13.1 g/dL (ref 12.0–15.0)
Lymphocytes Relative: 32.1 % (ref 12.0–46.0)
Lymphs Abs: 1.9 10*3/uL (ref 0.7–4.0)
MCHC: 31.8 g/dL (ref 30.0–36.0)
MCV: 85.8 fl (ref 78.0–100.0)
Monocytes Absolute: 0.7 10*3/uL (ref 0.1–1.0)
Monocytes Relative: 12.1 % — ABNORMAL HIGH (ref 3.0–12.0)
Neutro Abs: 3.1 10*3/uL (ref 1.4–7.7)
Neutrophils Relative %: 51.4 % (ref 43.0–77.0)
Platelets: 180 10*3/uL (ref 150.0–400.0)
RBC: 4.81 Mil/uL (ref 3.87–5.11)
RDW: 14.3 % (ref 11.5–15.5)
WBC: 6 10*3/uL (ref 4.0–10.5)

## 2020-07-21 LAB — LIPID PANEL
Cholesterol: 152 mg/dL (ref 0–200)
HDL: 82.8 mg/dL (ref >39.00)
LDL Cholesterol: 54 mg/dL (ref 0–99)
NonHDL: 69.06
Total CHOL/HDL Ratio: 2
Triglycerides: 77 mg/dL (ref 0.0–149.0)
VLDL: 15.4 mg/dL (ref 0.0–40.0)

## 2020-07-21 LAB — COMPREHENSIVE METABOLIC PANEL
ALT: 78 U/L — ABNORMAL HIGH (ref 0–35)
AST: 46 U/L — ABNORMAL HIGH (ref 0–37)
Albumin: 4.8 g/dL (ref 3.5–5.2)
Alkaline Phosphatase: 99 U/L (ref 39–117)
BUN: 11 mg/dL (ref 6–23)
CO2: 35 mEq/L — ABNORMAL HIGH (ref 19–32)
Calcium: 10 mg/dL (ref 8.4–10.5)
Chloride: 99 mEq/L (ref 96–112)
Creatinine, Ser: 0.93 mg/dL (ref 0.40–1.20)
GFR: 65.39 mL/min (ref >60.00)
Glucose, Bld: 89 mg/dL (ref 70–99)
Potassium: 4.4 mEq/L (ref 3.5–5.1)
Sodium: 139 mEq/L (ref 135–145)
Total Bilirubin: 0.6 mg/dL (ref 0.2–1.2)
Total Protein: 7.6 g/dL (ref 6.0–8.3)

## 2020-07-21 LAB — HEMOGLOBIN A1C: Hgb A1c MFr Bld: 6.1 % (ref 4.6–6.5)

## 2020-07-21 LAB — TSH: TSH: 3.3 u[IU]/mL (ref 0.35–4.50)

## 2020-07-21 MED ORDER — CEPHALEXIN 500 MG PO CAPS: 500.0000 mg | ORAL_CAPSULE | Freq: Two times a day (BID) | ORAL | 0 refills | Status: DC

## 2020-07-21 MED FILL — CEPHALEXIN 500 MG CAPSULE: 500 | 10 days supply | Qty: 20 | Fill #0

## 2020-07-21 NOTE — Assessment & Plan Note (Signed)
Has app with neuro pending

## 2020-07-21 NOTE — Progress Notes (Signed)
Patient ID: [name], female    DOB: 10/12/1955  Age: 65 y.o. MRN: [id number]    Subjective:  Subjective  HPI [name] presents for c/o R hand injury after passing out on 12/30/  Pt states her bp dropped and she passed out and she does not know for how long ---  She woke up and had urinated on herself    She does have a cut on her hand as a result.  This has not happened again since    She already has an app with neuro set up for next month --- her f/u with cardiology is not until august.  She denies cp or sob, palpitations   She also injured her R pinky toe with the fall but is seeing podiatry tomorrow.   Pt has applied for permanent disability ---  It is pending   Review of Systems  Constitutional: Negative for appetite change, diaphoresis, fatigue and unexpected weight change.  Eyes: Negative for pain, redness and visual disturbance.  Respiratory: Negative for cough, chest tightness, shortness of breath and wheezing.   Cardiovascular: Negative for chest pain, palpitations and leg swelling.  Endocrine: Negative for cold intolerance, heat intolerance, polydipsia, polyphagia and polyuria.  Genitourinary: Negative for difficulty urinating, dysuria and frequency.  Neurological: Positive for dizziness, syncope and headaches. Negative for light-headedness and numbness.    History Past Medical History:  Diagnosis Date  . Allergic rhinitis   . Anxiety   . Asthma    hx bronchial asthma at times with upper resp infection  . Depression   . Fibromyalgia   . GERD (gastroesophageal reflux disease)   . Headache(784.0)    migraine and cluster headaches  . Hyperlipemia   . Hypertension   . IBS (irritable bowel syndrome)   . Menopause   . Neuromuscular disorder ([place]) 2009   hx of fibromyalgia, polyarthralsia (surgery induced)  . Osteoarthritis   . Rheumatoid arthritis ([place])    Complicated by osteoarthritis as well.  . Rheumatoid arthritis ([place])   . Stress incontinence    at  times  . Tibial fracture 10/14/2016   evulsion periostial right  . Type 2 diabetes mellitus with complication, without long-term current use of insulin (HCC)    diet controlled no meds -> however was diagnosed with a diabetic foot ulcer.    She has a past surgical history that includes Cesarean section ([place]); Cervical laminectomy (2005 & 2009); Foot surgery (2005); right knee (1981); Nasal sinus surgery ([place]); Laparoscopic gastric banding (12/30/09); Esophagogastroduodenoscopy (06/29/2011); Hysteroscopy (1999); laparoscopy (09/24/2011); Gastric banding port revision (09/24/2011); Tonsillectomy (1971); Cholecystectomy (1986); Tubal ligation (1993); right knee arthroscopy and arthrotomy [id number]); Cervical conization w/bx (june 1990); laparoscopy (07/03/2012); Laparoscopic revision of gastric band (07/03/2012); Mesh applied to lap port (07/03/2012); Dilation and curettage of uterus ( april-1989); Laparoscopic repair and removal of gastric band (N/A, 07/02/2013); Laparoscopic gastric sleeve resection (N/A, 07/02/2013); Abdominal hysterectomy (12/1999); Gastric Roux-En-Y (N/A, 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   Her family history includes AAA (abdominal aortic aneurysm) (age of onset: 53) in her father; Atrial fibrillation in her mother; Breast cancer in her mother; CAD (age of onset: 72) in her father; COPD in an other family member; Cancer (age of onset: 86) in her mother; Dementia in her father; Diabetes in her father and mother; Heart attack (age of onset: 48) in her father; Heart disease in an other family member; Hypertension in her father and mother; Stroke in her mother; Stroke (age  of onset: 82) in her father; Thyroid disease in an other family member.She reports that she has never smoked. She has never used smokeless tobacco. She reports that she does not drink alcohol and does not use drugs.  Current Outpatient Medications on File Prior to Visit  Medication Sig  Dispense Refill  . acetaminophen (TYLENOL) 650 MG CR tablet Take 650 mg by mouth 2 (two) times daily as needed for pain.    . Ca Phosphate-Cholecalciferol (CALTRATE GUMMY BITES PO) Take 2 each 2 (two) times daily by mouth.     . Colchicine 0.6 MG CAPS Take 1 capsule by mouth daily.    . cyclobenzaprine (FLEXERIL) 10 MG tablet Take 1 tablet (10 mg total) by mouth 3 (three) times daily as needed. for muscle spams 30 tablet 1  . diclofenac Sodium (VOLTAREN) 1 % GEL Apply 4 g topically 4 (four) times daily. 200 g 3  . DULoxetine (CYMBALTA) 30 MG capsule Take 2 capsules (60 mg total) by mouth daily. 180 capsule 3  . estradiol (VIVELLE-DOT) 0.1 MG/24HR Place 1 patch (0.1 mg total) onto the skin 2 (two) times a week. Monday and Thursday 8 patch 11  . furosemide (LASIX) 20 MG tablet TAKE 1 TABLET BY MOUTH EVERY MORNING 90 tablet 1  . glucose blood test strip Test blood sugar once daily. Dx Code: E11.9 100 each 12  . hydroxychloroquine (PLAQUENIL) 200 MG tablet 200 mg 2 (two) times daily.     Marland Kitchen leflunomide (ARAVA) 20 MG tablet Take 10 mg by mouth daily. Taking 10mg     . loratadine (CLARITIN) 10 MG tablet Take 10 mg by mouth every evening.    . methocarbamol (ROBAXIN) 500 MG tablet Take 1 tablet (500 mg total) by mouth 4 (four) times daily. (Patient taking differently: Take 500 mg by mouth 4 (four) times daily as needed.) 45 tablet 1  . mupirocin ointment (BACTROBAN) 2 % Apply 1 application topically 2 (two) times daily. 30 g 2  . NONFORMULARY OR COMPOUNDED ITEM bp cuff  Dx hypertension 1 each 0  . nortriptyline (PAMELOR) 10 MG capsule Take by mouth.    . olmesartan (BENICAR) 5 MG tablet Take 2 tablets (10 mg total) by mouth daily. 180 tablet 1  . Pediatric Multivit-Minerals-C (ONE-A-DAY SCOOBY-DOO GUMMIES PO) Take 1 tablet 2 (two) times daily by mouth.     . potassium chloride (KLOR-CON) 10 MEQ tablet TAKE 1 TABLET (10 MEQ TOTAL) BY MOUTH DAILY. 90 tablet 1  . PREMARIN vaginal cream Apply 1 application  topically 2 (two) times a week. Mondays & Thursdays.  1  . rosuvastatin (CRESTOR) 20 MG tablet 1 po qd 90 tablet 2  . temazepam (RESTORIL) 30 MG capsule Take 1 capsule (30 mg total) by mouth at bedtime as needed for sleep. 90 capsule 3  . thiamine (VITAMIN B-1) 100 MG tablet Take 100 mg daily by mouth.    . traMADol (ULTRAM) 50 MG tablet tramadol 50 mg tablet  TAKE 1 TABLET BY MOUTH EVERY 8 HOURS AS NEEDED    . UNABLE TO FIND Med Name: Actemra infusion 4 MF    . ALPRAZolam (XANAX) 0.25 MG tablet Take 1 tablet (0.25 mg total) by mouth 2 (two) times daily as needed for anxiety. (Patient not taking: Reported on 07/21/2020) 20 tablet 0  . HYDROcodone-acetaminophen (NORCO/VICODIN) 5-325 MG tablet Take 1 tablet by mouth every 4 (four) hours as needed.    . pregabalin (LYRICA) 75 MG capsule Take 75 mg by mouth daily.  No current facility-administered medications on file prior to visit.     Objective:  Objective  Physical Exam Vitals and nursing note reviewed.  Constitutional:      Appearance: She is well-developed and well-nourished.  HENT:     Head: Normocephalic and atraumatic.  Eyes:     Extraocular Movements: EOM normal.     Conjunctiva/sclera: Conjunctivae normal.  Neck:     Thyroid: No thyromegaly.     Vascular: No carotid bruit or JVD.  Cardiovascular:     Rate and Rhythm: Normal rate and regular rhythm.     Heart sounds: Normal heart sounds. No murmur heard.   Pulmonary:     Effort: Pulmonary effort is normal. No respiratory distress.     Breath sounds: Normal breath sounds. No wheezing or rales.  Chest:     Chest wall: No tenderness.  Musculoskeletal:        General: No edema.     Cervical back: Normal range of motion and neck supple.  Skin:    Findings: Erythema and lesion present.     Comments: See picture  + skin evulsion  Neurological:     Mental Status: She is alert and oriented to person, place, and time.     Cranial Nerves: No cranial nerve deficit.      Sensory: No sensory deficit.     Motor: No weakness.     Coordination: Coordination normal.     Gait: Gait abnormal.     Deep Tendon Reflexes: Reflexes normal.  Psychiatric:        Mood and Affect: Mood and affect normal.    BP 140/74 (BP Location: Right Arm, Patient Position: Sitting, Cuff Size: Large)   Pulse 76   Temp 98.2 F (36.8 C) (Oral)   Ht 5\' 11"  (1.803 m)   Wt 210 lb (95.3 kg)   SpO2 96%   BMI 29.29 kg/m  Wt Readings from Last 3 Encounters:  07/21/20 210 lb (95.3 kg)  06/09/20 211 lb 12.8 oz (96.1 kg)  03/03/20 188 lb 12.8 oz (85.6 kg)     Lab Results  Component Value Date   WBC 6.0 07/21/2020   HGB 13.1 07/21/2020   HCT 41.2 07/21/2020   PLT 180.0 07/21/2020   GLUCOSE 89 07/21/2020   CHOL 152 07/21/2020   TRIG 77.0 07/21/2020   HDL 82.80 07/21/2020   LDLCALC 54 07/21/2020   ALT 78 (H) 07/21/2020   AST 46 (H) 07/21/2020   NA 139 07/21/2020   K 4.4 07/21/2020   CL 99 07/21/2020   CREATININE 0.93 07/21/2020   BUN 11 07/21/2020   CO2 35 (H) 07/21/2020   TSH 3.30 07/21/2020   HGBA1C 6.1 07/21/2020   MICROALBUR 0.8 08/02/2019   ekg-- no change from previous ekg done with cardiology  MR SACRUM SI JOINTS W WO CONTRAST  Result Date: 02/15/2020 CLINICAL DATA:  Severe sacral pain. History of fibromyalgia. History of rheumatoid arthritis. Severe pain radiating through the sacrum when standing. EXAM: MRI LUMBAR SPINE WITHOUT AND WITH CONTRAST TECHNIQUE: Multiplanar and multiecho pulse sequences of the lumbar spine were obtained without and with intravenous contrast. CONTRAST:  86mL GADAVIST GADOBUTROL 1 MMOL/ML IV SOLN COMPARISON:  None. FINDINGS: Bones/Joint/Cartilage No fracture or dislocation. Normal alignment. No joint effusion. No SI joint widening or erosive changes. Mild osteoarthritis of the left SI joint with subchondral reactive marrow changes along the anterior sacrum. L4-5: Mild grade 1 anterolisthesis of L4 on L5 secondary to facet disease. Mild-moderate  bilateral facet  arthropathy. Mild spinal stenosis. L5-S1: Severe bilateral facet arthropathy with subchondral marrow edema. Moderate bilateral foraminal stenosis. Ligaments, Muscles and Tendons Muscles are normal.  No muscle edema or muscle atrophy. Soft tissue No fluid collection or hematoma.  No soft tissue mass. IMPRESSION: 1. No acute osseous injury of the sacrum. 2. Mild osteoarthritis of the left SI joint. Otherwise no evidence of sacroiliitis. 3. At L5-S1 there is severe bilateral facet arthropathy with subchondral marrow edema. Moderate bilateral foraminal stenosis. 4. At L4-5 there is mild grade 1 anterolisthesis of L4 on L5 secondary to facet disease. Mild-moderate bilateral facet arthropathy. Mild spinal stenosis. Electronically Signed   By: Kathreen Devoid   On: 02/15/2020 11:29     Assessment & Plan:  Plan  I am having [place] Tupy start on cephALEXin. I am also having her maintain her loratadine, Ca Phosphate-Cholecalciferol (CALTRATE GUMMY BITES PO), Pediatric Multivit-Minerals-C (ONE-A-DAY SCOOBY-DOO GUMMIES PO), estradiol, Premarin, thiamine, acetaminophen, glucose blood, cyclobenzaprine, hydroxychloroquine, ALPRAZolam, DULoxetine, methocarbamol, potassium chloride, leflunomide, NONFORMULARY OR COMPOUNDED ITEM, pregabalin, rosuvastatin, Colchicine, HYDROcodone-acetaminophen, traMADol, mupirocin ointment, UNABLE TO FIND, diclofenac Sodium, olmesartan, furosemide, temazepam, and nortriptyline.  Meds ordered this encounter  Medications  . cephALEXin (KEFLEX) 500 MG capsule    Sig: Take 1 capsule (500 mg total) by mouth 2 (two) times daily.    Dispense:  20 capsule    Refill:  0    Problem List Items Addressed This Visit      Unprioritized   Cellulitis of right hand    abx per orders  F/u prn  Tetanus utd       Relevant Medications   cephALEXin (KEFLEX) 500 MG capsule   Chronic nonintractable headache    Has app with neuro pending       Relevant Medications    nortriptyline (PAMELOR) 10 MG capsule   Other Relevant Orders   MR Brain Wo Contrast   Controlled type 2 diabetes mellitus with diabetic neuropathy, without long-term current use of insulin (HCC)    hgba1c to be checked  minimize simple carbs. Increase exercise as tolerated. Continue current meds       Relevant Orders   Lipid panel (Completed)   Hemoglobin A1c (Completed)   Comprehensive metabolic panel (Completed)   Dizziness    Check mri/ mra F/u neuro       Relevant Orders   MR Angiogram Head Wo Contrast   US Carotid Bilateral   EKG 12-Lead (Completed)   Comprehensive metabolic panel (Completed)   CBC with Differential/Platelet (Completed)   TSH (Completed)   Hypertension    Well controlled, no changes to meds. Encouraged heart healthy diet such as the DASH diet and exercise as tolerated.       Relevant Orders   Lipid panel (Completed)   Hemoglobin A1c (Completed)   Comprehensive metabolic panel (Completed)   CBC with Differential/Platelet (Completed)   TSH (Completed)   Syncope - Primary    Occurred 12/30 If occurs again pt instructed to go to ER F/u cardiology Pt has app with neuro next month Check labs        Relevant Orders   MR Brain Wo Contrast   US Carotid Bilateral   EKG 12-Lead (Completed)   Lipid panel (Completed)   Hemoglobin A1c (Completed)   Comprehensive metabolic panel (Completed)   CBC with Differential/Platelet (Completed)   TSH (Completed)        Follow-up: Return in about 3 months (around 10/19/2020), or if symptoms worsen or fail to improve.  Kendrick Fries  Carrizales, DO

## 2020-07-21 NOTE — Telephone Encounter (Signed)
New Message:      Pt said she saw her Primary Doctor today. She wanted the pt to let Dr Ellyn Hack know that she will be communicating with him via My Chart. Pt have been having syncope episodes and Primary Doctor wants to let Dr Ellyn Hack know what is going on.

## 2020-07-21 NOTE — Patient Instructions (Signed)
Syncope  Syncope refers to a condition in which a person temporarily loses consciousness. Syncope may also be called fainting or passing out. It is caused by a sudden decrease in blood flow to the brain. Even though most causes of syncope are not dangerous, syncope can be a sign of a serious medical problem. Your health care provider may do tests to find the reason why you are having syncope. Signs that you may be about to faint include:  Feeling dizzy or light-headed.  Feeling nauseous.  Seeing all white or all black in your field of vision.  Having cold, clammy skin. If you faint, get medical help right away. Call your local emergency services (911 in the [place] Do not drive yourself to the hospital. Follow these instructions at home: Pay attention to any changes in your symptoms. Take these actions to stay safe and to help relieve your symptoms: Lifestyle  Do not drive, use machinery, or play sports until your health care provider says it is okay.  Do not drink alcohol.  Do not use any products that contain nicotine or tobacco, such as cigarettes and e-cigarettes. If you need help quitting, ask your health care provider.  Drink enough fluid to keep your urine pale yellow. General instructions  Take over-the-counter and prescription medicines only as told by your health care provider.  If you are taking blood pressure or heart medicine, get up slowly and take several minutes to sit and then stand. This can reduce dizziness or light-headedness.  Have someone stay with you until you feel stable.  If you start to feel like you might faint, lie down right away and raise (elevate) your feet above the level of your heart. Breathe deeply and steadily. Wait until all the symptoms have passed.  Keep all follow-up visits as told by your health care provider. This is important. Get help right away if you:  Have a severe headache.  Faint once or repeatedly.  Have pain in your chest,  abdomen, or back.  Have a very fast or irregular heartbeat (palpitations).  Have pain when you breathe.  Are bleeding from your mouth or rectum, or you have black or tarry stool.  Have a seizure.  Are confused.  Have trouble walking.  Have severe weakness.  Have vision problems. These symptoms may represent a serious problem that is an emergency. Do not wait to see if your symptoms will go away. Get medical help right away. Call your local emergency services (911 in the [place] Do not drive yourself to the hospital. Summary  Syncope refers to a condition in which a person temporarily loses consciousness. It is caused by a sudden decrease in blood flow to the brain.  Signs that you may be about to faint include dizziness, feeling light-headed, feeling nauseous, sudden vision changes, or cold, clammy skin.  Although most causes of syncope are not dangerous, syncope can be a sign of a serious medical problem. If you faint, get medical help right away. This information is not intended to replace advice given to you by your health care provider. Make sure you discuss any questions you have with your health care provider. Document Revised: 11/08/2019 Document Reviewed: 11/08/2019 Elsevier Patient Education  2021 [employer]  

## 2020-07-21 NOTE — Telephone Encounter (Signed)
Pt called to let Dr. Ellyn Hack know that she had two episodes of full loss of consciousness at the end of December. These occurred while she was at home in her kitchen, about 10 minutes apart. Pt is unsure how long she lay unconscious, and awoke to discover that she had injured her right hand during the fall as well as urinated on herself. Saw PCP today regarding. After regaining consciousness the second time, pt took her BP and noted it was 92/52. She notes that she ought to have checked her blood sugar at that time, but did not think to do so. BP generally runs 140s/70s. EKG at PCP today demonstrated RBBB per pt report.   Pt states Dr. Etter Sjogren has ordered carotid ultrasounds and MRI/MRA of brain, and will be contacting Dr. Ellyn Hack directly related to the pt's office visit today. PCP advised pt to go to the ER for any recurrent symptoms. Pt voices understanding, but states she is very reluctant to go to ER. Was advised not to drive.   Will forward message to Dr. Ellyn Hack for review.

## 2020-07-21 NOTE — Assessment & Plan Note (Signed)
Occurred 12/30 If occurs again pt instructed to go to ER F/u cardiology Pt has app with neuro next month Check labs

## 2020-07-21 NOTE — Assessment & Plan Note (Signed)
hgba1c to be checked minimize simple carbs. Increase exercise as tolerated. Continue current meds 

## 2020-07-21 NOTE — Assessment & Plan Note (Signed)
Well controlled, no changes to meds. Encouraged heart healthy diet such as the DASH diet and exercise as tolerated.  °

## 2020-07-21 NOTE — Assessment & Plan Note (Signed)
Check mri/ mra F/u neuro

## 2020-07-21 NOTE — Assessment & Plan Note (Signed)
abx per orders  F/u prn  Tetanus utd

## 2020-07-22 ENCOUNTER — Ambulatory Visit (INDEPENDENT_AMBULATORY_CARE_PROVIDER_SITE_OTHER): Payer: 59 | Admitting: Podiatry

## 2020-07-22 ENCOUNTER — Encounter: Payer: Self-pay | Admitting: Podiatry

## 2020-07-22 DIAGNOSIS — M2042 Other hammer toe(s) (acquired), left foot: Secondary | ICD-10-CM

## 2020-07-22 DIAGNOSIS — M1990 Unspecified osteoarthritis, unspecified site: Secondary | ICD-10-CM | POA: Insufficient documentation

## 2020-07-22 DIAGNOSIS — S99921A Unspecified injury of right foot, initial encounter: Secondary | ICD-10-CM

## 2020-07-22 DIAGNOSIS — L84 Corns and callosities: Secondary | ICD-10-CM | POA: Diagnosis not present

## 2020-07-22 DIAGNOSIS — D696 Thrombocytopenia, unspecified: Secondary | ICD-10-CM | POA: Insufficient documentation

## 2020-07-22 DIAGNOSIS — E1142 Type 2 diabetes mellitus with diabetic polyneuropathy: Secondary | ICD-10-CM

## 2020-07-22 DIAGNOSIS — M2011 Hallux valgus (acquired), right foot: Secondary | ICD-10-CM

## 2020-07-22 DIAGNOSIS — M1380 Other specified arthritis, unspecified site: Secondary | ICD-10-CM | POA: Insufficient documentation

## 2020-07-22 DIAGNOSIS — M2041 Other hammer toe(s) (acquired), right foot: Secondary | ICD-10-CM

## 2020-07-22 NOTE — Progress Notes (Signed)
Subjective:  Patient ID: [name], female    DOB: Dec 09, 1955,  MRN: [id number]  65 y.o. female presents with preventative diabetic foot care.    Patient states her toes are much better as she continues to use Aquaphor Ointment daily.  Patient states on July 11, 2020, she passed out and fell. She states it was only for a few seconds. She states she broke her right 5th toe and sustained an injury to her right hand as well. She states toe is resolving and color is better than initial injury. She will be having brain MRI, USD of b/l carotids, arteriogram of brain and aorta for further work up of her fainting spell.   Regarding the right 5th digit, she has been performing buddy splinting of digit.  PCP: [name], DO and last visit was: 07/22/2019.  Review of Systems: Negative except as noted in the HPI.  Past Medical History:  Diagnosis Date  . Allergic rhinitis   . Anxiety   . Asthma    hx bronchial asthma at times with upper resp infection  . Depression   . Fibromyalgia   . GERD (gastroesophageal reflux disease)   . Headache(784.0)    migraine and cluster headaches  . Hyperlipemia   . Hypertension   . IBS (irritable bowel syndrome)   . Menopause   . Neuromuscular disorder ([place]) 2009   hx of fibromyalgia, polyarthralsia (surgery induced)  . Osteoarthritis   . Rheumatoid arthritis ([place])    Complicated by osteoarthritis as well.  . Rheumatoid arthritis ([place])   . Stress incontinence    at times  . Tibial fracture 10/14/2016   evulsion periostial right  . Type 2 diabetes mellitus with complication, without long-term current use of insulin (HCC)    diet controlled no meds -> however was diagnosed with a diabetic foot ulcer.   Past Surgical History:  Procedure Laterality Date  . ABDOMINAL HYSTERECTOMY  12/1999   complete  . CARPAL TUNNEL RELEASE Right 12/27/2017   Procedure: CARPAL TUNNEL RELEASE;  Surgeon: Roseanne Kaufman, MD;  Location: [facility];  Service:  Orthopedics;  Laterality: Right;  . CERVICAL CONIZATION W/BX  june 1990   dysplasia of cervix/used 5Fu cream for 3 months  . CERVICAL LAMINECTOMY  2005 & 2009   X 2   NO ROM PROBLEMS  . [place]  . DILATION AND CURETTAGE OF UTERUS   [phone]   missed abortion  . ESOPHAGOGASTRODUODENOSCOPY  06/29/2011   Procedure: ESOPHAGOGASTRODUODENOSCOPY (EGD);  Surgeon: Shann Medal, MD;  Location: [facility];  Service: General;  Laterality: N/A;  . FOOT SURGERY  2005   RT HEEL  . GASTRIC BANDING PORT REVISION  09/24/2011   Procedure: GASTRIC BANDING PORT REVISION;  Surgeon: Pedro Earls, MD;  Location: [facility];  Service: General;  Laterality: N/A;  . GASTRIC ROUX-EN-Y N/A 06/06/2017   Procedure: Conversion from Sleeve to [place];  Surgeon: Johnathan Hausen, MD;  Location: [facility];  Service: General;  Laterality: N/A;  . HYSTEROSCOPY  1999  . LAPAROSCOPIC GASTRIC BANDING  12/30/09  . LAPAROSCOPIC GASTRIC SLEEVE RESECTION N/A 07/02/2013   Procedure: LAPAROSCOPIC GASTRIC SLEEVE RESECTION upper endoscopy;  Surgeon: Pedro Earls, MD;  Location: [facility];  Service: General;  Laterality: N/A;  . LAPAROSCOPIC REPAIR AND REMOVAL OF GASTRIC BAND N/A 07/02/2013   Procedure: LAPAROSCOPIC REMOVAL OF GASTRIC BAND;  Surgeon: Rodman Key  Hortencia Conradi, MD;  Location: [facility];  Service: General;  Laterality: N/A;  . LAPAROSCOPIC REVISION OF GASTRIC BAND  07/03/2012   Procedure: LAPAROSCOPIC REVISION OF GASTRIC BAND;  Surgeon: Valarie Merino, MD;  Location: [facility];  Service: General;  Laterality: N/A;  removal of old lap. band port, replaced with AP standard band  . LAPAROSCOPY  09/24/2011   Procedure: LAPAROSCOPY DIAGNOSTIC;  Surgeon: Valarie Merino, MD;  Location: [facility];  Service: General;  Laterality: N/A;  . LAPAROSCOPY  07/03/2012   Procedure: LAPAROSCOPY DIAGNOSTIC;  Surgeon: Valarie Merino, MD;  Location:  [facility];  Service: General;  Laterality: N/A;  Exploratory Laparoscopy   . MESH APPLIED TO LAP PORT  07/03/2012   Procedure: MESH APPLIED TO LAP PORT;  Surgeon: Valarie Merino, MD;  Location: [facility];  Service: General;  Laterality: N/A;  . NASAL SINUS SURGERY  1995 & 1997   X 2  . right knee  1981   ARTHROSCOPY AND ARTHROTOMY  . right knee arthroscopy and arthrotomy  12-1979  . TONSILLECTOMY  1971  . TUBAL LIGATION  1993   WITH C -SECTION   Patient Active Problem List   Diagnosis Date Noted  . Inflammatory arthritis 07/22/2020  . Thrombocytopenia (HCC) 07/22/2020  . Chronic nonintractable headache 07/21/2020  . Syncope 07/21/2020  . Dizziness 07/21/2020  . Cellulitis of right hand 07/21/2020  . Lumbar spondylolysis 05/13/2020  . Menopausal symptom 03/11/2020  . Stress 03/11/2020  . Unspecified dyspareunia (CODE) 03/11/2020  . Bilateral sacroiliitis (HCC) 03/04/2020  . Senile calcific aortic valve sclerosis 02/08/2020  . DOE (dyspnea on exertion) 02/08/2020  . Newly recognized heart murmur 01/07/2020  . Acquired trigger finger of right ring finger 10/25/2019  . Pain in right knee 08/24/2019  . Rheumatoid arthritis involving multiple sites with positive rheumatoid factor (HCC) 08/02/2019  . Primary osteoarthritis of both knees 08/02/2019  . PTSD (post-traumatic stress disorder) 02/08/2019  . Dog bite of arm, right, initial encounter 10/05/2018  . Hyperlipidemia associated with type 2 diabetes mellitus (HCC) 08/24/2018  . Symptomatic abdominal panniculus 12/12/2017  . Carpal tunnel syndrome of left wrist 11/28/2017  . Pain in right hand 11/08/2017  . Cervical radiculopathy 10/06/2017  . 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 Keratosis 12/04/2014  . Onychocryptosis 08/21/2014  . Onychomycosis 05/21/2014  . Fissured skin 01/22/2014  . Fissure in skin of foot 12/18/2013  . Porokeratosis 12/18/2013  . Pain in lower limb 11/21/2013  . Ingrown nail 07/20/2013  . Lap sleeve  gastrectomy (with removal of Lapband) Dec 2014 07/02/2013  . Obesity (BMI 30-39.9) 06/25/2013  . GERD (gastroesophageal reflux disease) 08/20/2011  . Lapband APL + [facility] repair June 2011-Major revision Dec 2013 06/29/2011  . ABDOMINAL PAIN, ACUTE 08/01/2010  . VITAMIN B12 DEFICIENCY 04/01/2010  . BARIATRIC SURGERY STATUS 01/29/2010  . ONYCHOMYCOSIS, BILATERAL 06/30/2009  . STRESS INCONTINENCE 06/10/2009  . ACUTE PHARYNGITIS 04/29/2009  . COUGH 04/27/2009  . BRONCHITIS, ACUTE 04/16/2009  . NAUSEA 04/08/2009  . DIARRHEA 04/08/2009  . MORBID OBESITY 03/03/2009  . SKIN RASH 11/18/2008  . UNSPECIFIED VITAMIN D DEFICIENCY 07/15/2008  . OTHER SPECIFIED ANEMIAS 07/15/2008  . Pain in joint, multiple sites 06/13/2008  . Myalgia and myositis, unspecified 06/13/2008  . BACK PAIN, LUMBAR 02/14/2008  . ACUTE SINUSITIS, UNSPECIFIED 09/01/2007  . CELLULITIS 08/12/2007  . Depression with anxiety 06/09/2007  . DEPRESSION 06/09/2007  . Controlled type 2 diabetes mellitus with diabetic neuropathy, without long-term current use of insulin (HCC) 01/04/2007  .  Hypertension 01/04/2007  . ALLERGIC RHINITIS 01/04/2007  . HEADACHE 01/04/2007    Current Outpatient Medications:  .  amitriptyline (ELAVIL) 25 MG tablet, Take by mouth., Disp: , Rfl:  .  acetaminophen (TYLENOL 8 HOUR) 650 MG CR tablet, 2 tablets as needed, Disp: , Rfl:  .  ALPRAZolam (XANAX) 0.25 MG tablet, Take 1 tablet (0.25 mg total) by mouth 2 (two) times daily as needed for anxiety. (Patient not taking: Reported on 07/21/2020), Disp: 20 tablet, Rfl: 0 .  Ca Phosphate-Cholecalciferol (CALTRATE GUMMY BITES PO), Take 2 each 2 (two) times daily by mouth. , Disp: , Rfl:  .  cephALEXin (KEFLEX) 500 MG capsule, Take 1 capsule (500 mg total) by mouth 2 (two) times daily., Disp: 20 capsule, Rfl: 0 .  Colchicine 0.6 MG CAPS, Take 1 capsule by mouth daily., Disp: , Rfl:  .  Cyclobenzaprine HCl (FLEXERIL PO), , Disp: , Rfl:  .  diclofenac Sodium  (VOLTAREN) 1 % GEL, See admin instructions., Disp: , Rfl:  .  DULoxetine (CYMBALTA) 30 MG capsule, 1 capsule, Disp: , Rfl:  .  estradiol (VIVELLE-DOT) 0.1 MG/24HR, Place 1 patch (0.1 mg total) onto the skin 2 (two) times a week. Monday and Thursday, Disp: 8 patch, Rfl: 11 .  Estradiol Acetate 0.1 MG/24HR RING, See admin instructions., Disp: , Rfl:  .  folic acid (FOLVITE) 1 MG tablet, 1 tablet, Disp: , Rfl:  .  furosemide (LASIX) 20 MG tablet, TAKE 1 TABLET BY MOUTH EVERY MORNING, Disp: 90 tablet, Rfl: 1 .  furosemide (LASIX) 20 MG tablet, 1 tablet, Disp: , Rfl:  .  glucose blood test strip, Test blood sugar once daily. Dx Code: E11.9, Disp: 100 each, Rfl: 12 .  hydroxychloroquine (PLAQUENIL) 200 MG tablet, 200 mg 2 (two) times daily. , Disp: , Rfl:  .  leflunomide (ARAVA) 20 MG tablet, Take 10 mg by mouth daily. Taking 10mg , Disp: , Rfl:  .  loratadine (CLARITIN) 10 MG tablet, Take 10 mg by mouth every evening., Disp: , Rfl:  .  methocarbamol (ROBAXIN) 500 MG tablet, 1.5 tablets, Disp: , Rfl:  .  mupirocin ointment (BACTROBAN) 2 %, Apply 1 application topically 2 (two) times daily., Disp: 30 g, Rfl: 2 .  NONFORMULARY OR COMPOUNDED ITEM, bp cuff  Dx hypertension, Disp: 1 each, Rfl: 0 .  nortriptyline (PAMELOR) 10 MG capsule, Take by mouth., Disp: , Rfl:  .  olmesartan (BENICAR) 5 MG tablet, 1 tablet, Disp: , Rfl:  .  Pediatric Multivit-Minerals-C (ONE-A-DAY SCOOBY-DOO GUMMIES PO), Take 1 tablet 2 (two) times daily by mouth. , Disp: , Rfl:  .  potassium chloride (KLOR-CON) 10 MEQ tablet, TAKE 1 TABLET (10 MEQ TOTAL) BY MOUTH DAILY., Disp: 90 tablet, Rfl: 1 .  pregabalin (LYRICA) 100 MG capsule, , Disp: , Rfl:  .  PREMARIN vaginal cream, Apply 1 application topically 2 (two) times a week. Mondays & Thursdays., Disp: , Rfl: 1 .  rosuvastatin (CRESTOR) 20 MG tablet, 1 po qd, Disp: 90 tablet, Rfl: 2 .  temazepam (RESTORIL) 30 MG capsule, Take 1 capsule (30 mg total) by mouth at bedtime as needed for  sleep., Disp: 90 capsule, Rfl: 3 .  thiamine (VITAMIN B-1) 100 MG tablet, Take 100 mg daily by mouth., Disp: , Rfl:  .  traMADol (ULTRAM) 50 MG tablet, 1 tablet as needed, Disp: , Rfl:  .  UNABLE TO FIND, Med Name: Actemra infusion 4 MF, Disp: , Rfl:  Allergies  Allergen Reactions  . Isoniazid  Severe SOB  . Nitrofurantoin Shortness Of Breath    REACTION: welts  . Hctz [Hydrochlorothiazide]     rash  . Oseltamivir Phosphate Nausea And Vomiting    "I vomited within 30 minutes of taking it and was told I cannot ever take it again."  . Promethazine Hcl [Promethazine Hcl]     IF GIVEN  IV-hallucinations     CAN TAKE PO PHENERGAN  . Sulfa Antibiotics Rash  . Tamiflu [Oseltamivir Phosphate] Nausea And Vomiting    "I vomited within 30 minutes of taking it and was told I cannot ever take it again."  . Gabapentin     Pt states she has eye swelling   . Macrolides And Ketolides     rash  . Morphine Other (See Comments)    severe vomiting  . Percocet [Oxycodone-Acetaminophen]     REACTION: severe itching. Can take by mouth codeine  . Roxicet [Oxycodone-Acetaminophen]     itching  . Sulfamethoxazole-Trimethoprim     Septra / Bactrim  --REACTION: rash  . Tamiflu [Oseltamivir]     Other reaction(s): Unknown  . Fentanyl Hives and Rash    REACTION: welts   Social History   Tobacco Use  Smoking Status Never Smoker  Smokeless Tobacco Never Used    Objective:  There were no vitals filed for this visit. Constitutional Patient is a pleasant 65 y.o. Caucasian female in NAD. AAO x 3.  Vascular Capillary fill time to digits <3 seconds b/l lower extremities. Palpable pedal pulses b/l LE. Pedal hair sparse. Lower extremity skin temperature gradient within normal limits. No ischemia or gangrene noted b/l lower extremities. No cyanosis or clubbing noted. Mild edema right 5th digit.  Neurologic Normal speech. Pt has subjective symptoms of neuropathy. Protective sensation intact 5/5 intact  bilaterally with 10g monofilament b/l. Vibratory sensation intact b/l.  Dermatologic Pedal skin with normal turgor, texture and tone bilaterally. No open wounds bilaterally. No interdigital macerations bilaterally. Toenails 1-5 b/l well maintained with adequate length. No erythema, no edema, no drainage, no fluctuance. Hyperkeratotic lesion(s) distal tip L 2nd toe. Right 2nd digit has completely resolved.  No erythema, no edema, no drainage, no fluctuance. She has resolving ecchymosis of right 5th digit dorsal PIPJ.   Orthopedic: Normal muscle strength 5/5 to all lower extremity muscle groups bilaterally. No pain crepitus or joint limitation noted with ROM b/l. Hammertoes noted to the L 2nd toe, L 3rd toe, R 3rd toe, R 4th toe and R 5th toe. There is no POP of 5th metatarsal head/shaft. Mild discomfort of right 5th toe.   Hemoglobin A1C Latest Ref Rng & Units 07/21/2020 01/07/2020 08/02/2019  HGBA1C 4.6 - 6.5 % 6.1 6.3 6.3  Some recent data might be hidden    Assessment:   1. Corns   2. Injury of toe on right foot, initial encounter   3. Hallux valgus, acquired, bilateral   4. Acquired hammertoes of both feet   5. Diabetic peripheral neuropathy associated with type 2 diabetes mellitus ([place])    Plan:  Patient was evaluated and treated and all questions answered.  -Examined patient. -Regarding right 5th toe, she declined xray today. She will continue buddy splinting daily. She will call if condition does not resolve. We will xray at that point. -Continue diabetic foot care principles. -Patient to continue soft, supportive shoe gear daily. -Corn(s) L 2nd toe pared utilizing sterile scalpel blade without complication or incident. Total number debrided=1. [place] daily use of Aquaphor Ointment to digits/feet. -Patient to report  any pedal injuries to medical professional immediately. -Patient/POA to call should there be question/concern in the interim.  Return in about 3 months (around  10/20/2020).  Marzetta Board, DPM

## 2020-07-23 ENCOUNTER — Other Ambulatory Visit: Payer: Self-pay | Admitting: Family Medicine

## 2020-07-23 DIAGNOSIS — R748 Abnormal levels of other serum enzymes: Secondary | ICD-10-CM

## 2020-07-24 ENCOUNTER — Encounter: Payer: Self-pay | Admitting: Family Medicine

## 2020-07-24 MED FILL — MUPIROCIN 2% OINTMENT: 2 | 14 days supply | Qty: 22 | Fill #1

## 2020-07-24 NOTE — Telephone Encounter (Signed)
Patient returning call.

## 2020-07-24 NOTE — Telephone Encounter (Signed)
It could be the meds----- I would like to recheck----but if rheum will recheck that is fine too

## 2020-07-24 NOTE — Telephone Encounter (Signed)
Please see message below

## 2020-07-24 NOTE — Telephone Encounter (Addendum)
Left message for patient to call back  . Will inform patient Dr Ellyn Hack and Dr Etter Sjogren has had a conversation - Dr Ellyn Hack has ordered a 30 day  Event monitor - the monitor will be mailed to her home with  Instruction.  Patient states she will be having carotid doppler schedule for 1/ 19 /2022. Neuro concsult already scheduled.  also, written  Instruction has been sent via mychart.

## 2020-07-24 NOTE — Telephone Encounter (Signed)
Spoke to patient. She is aware of ordered 30 day monitor for syncope.  Appointment schedule for 09/10/20 with Dr Ellyn Hack for follow up. Instruction sent by mychart. Patient aware  Event  monitor will be mailed.

## 2020-07-24 NOTE — Telephone Encounter (Signed)
-----   Message from Leonie Man, MD sent at 07/24/2020 12:38 AM EST ----- Regarding: RE: Syncope Ivin Booty - can u put in order for 30 Day event Monitor for longer coverage.  Dx = Syncope  Glenetta Hew, MD  ----- Message ----- From: [name], DO Sent: 07/23/2020   8:59 AM EST To: Leonie Man, MD Subject: RE: Syncope                                    If you would do that that would be great!  Thanks   yvonne ----- Message ----- From: Leonie Man, MD Sent: 07/23/2020   8:46 AM EST To: [name], DO Subject: RE: Syncope                                    If I order it - it comes to me to read - just let me know.  Glenetta Hew, MD  ----- Message ----- From: [name], DO Sent: 07/22/2020  12:18 PM EST To: Leonie Man, MD Subject: RE: Syncope                                    Thanks for your response---  I thought about the event monitor but wanted to hear from you first .  I did order the carotid dopplers yesterday.    Kendrick Fries ----- Message ----- From: Leonie Man, MD Sent: 07/22/2020  11:13 AM EST To: Raiford Simmonds, RN, [name], DO Subject: Syncope                                        Thanks for the heads up.  I am not sure if it makes a lot of sense to me to see her before she gets some studies done.  I would be looking for arrhythmias with an event monitor.  The usual routine would include checking Carotid Dopplers although they usually are not very helpful. ->  I can order the studies to make sure the results to me as well if you would like.  Quite often syncope occurring in the middle night is probably related to micturition syncope or something like that.  We can schedule her to be seen once these tests are done, otherwise it would just waste a visit.  Glenetta Hew, MD    ----- Message ----- From: [name], DO Sent: 07/21/2020   9:03 AM EST To: Leonie Man, MD  Good morning---  I saw [facility] today.  She had 2 episodes of sycope on 12/30 in the middle of the night-- she did not go to the ER---she already has an appointment to see neuro next month I'm ordering some tests / blood work etc but wanted you to be aware in case she needed to come in sooner.

## 2020-07-28 ENCOUNTER — Ambulatory Visit: Payer: 59 | Admitting: Psychology

## 2020-07-29 ENCOUNTER — Ambulatory Visit (INDEPENDENT_AMBULATORY_CARE_PROVIDER_SITE_OTHER): Payer: 59 | Admitting: Psychology

## 2020-07-29 DIAGNOSIS — F4323 Adjustment disorder with mixed anxiety and depressed mood: Secondary | ICD-10-CM | POA: Diagnosis not present

## 2020-07-30 ENCOUNTER — Ambulatory Visit (HOSPITAL_BASED_OUTPATIENT_CLINIC_OR_DEPARTMENT_OTHER)
Admission: RE | Admit: 2020-07-30 | Discharge: 2020-07-30 | Disposition: A | Discharge status: Home / Self Care | Payer: 59 | Source: Ambulatory Visit | Attending: Family Medicine | Admitting: Family Medicine

## 2020-07-30 ENCOUNTER — Encounter: Payer: Self-pay | Admitting: Family Medicine

## 2020-07-30 ENCOUNTER — Other Ambulatory Visit: Payer: Self-pay

## 2020-07-30 DIAGNOSIS — M791 Myalgia, unspecified site: Secondary | ICD-10-CM | POA: Diagnosis not present

## 2020-07-30 DIAGNOSIS — M0609 Rheumatoid arthritis without rheumatoid factor, multiple sites: Secondary | ICD-10-CM | POA: Diagnosis not present

## 2020-07-30 DIAGNOSIS — R748 Abnormal levels of other serum enzymes: Secondary | ICD-10-CM | POA: Diagnosis not present

## 2020-07-30 DIAGNOSIS — M549 Dorsalgia, unspecified: Secondary | ICD-10-CM | POA: Diagnosis not present

## 2020-07-30 DIAGNOSIS — R55 Syncope and collapse: Secondary | ICD-10-CM | POA: Insufficient documentation

## 2020-07-30 DIAGNOSIS — Z79899 Other long term (current) drug therapy: Secondary | ICD-10-CM | POA: Diagnosis not present

## 2020-07-30 DIAGNOSIS — R42 Dizziness and giddiness: Secondary | ICD-10-CM | POA: Diagnosis not present

## 2020-07-30 DIAGNOSIS — I6523 Occlusion and stenosis of bilateral carotid arteries: Secondary | ICD-10-CM | POA: Diagnosis not present

## 2020-07-30 DIAGNOSIS — Z1231 Encounter for screening mammogram for malignant neoplasm of breast: Secondary | ICD-10-CM | POA: Insufficient documentation

## 2020-07-30 DIAGNOSIS — G8929 Other chronic pain: Secondary | ICD-10-CM | POA: Diagnosis not present

## 2020-07-30 DIAGNOSIS — M79643 Pain in unspecified hand: Secondary | ICD-10-CM | POA: Diagnosis not present

## 2020-07-30 DIAGNOSIS — M1990 Unspecified osteoarthritis, unspecified site: Secondary | ICD-10-CM | POA: Diagnosis not present

## 2020-07-30 DIAGNOSIS — M797 Fibromyalgia: Secondary | ICD-10-CM | POA: Diagnosis not present

## 2020-07-30 NOTE — Telephone Encounter (Signed)
Dr Ellyn Hack was going to schedule the monitor so he would read it ----- you may want to contact him I'll found out about the MRA/ MRI

## 2020-07-30 NOTE — Telephone Encounter (Signed)
FYI

## 2020-07-30 NOTE — Telephone Encounter (Signed)
Ok to add ESR     dx rheum arthritis

## 2020-07-31 ENCOUNTER — Telehealth: Payer: Self-pay | Admitting: Cardiology

## 2020-07-31 ENCOUNTER — Other Ambulatory Visit: Payer: Self-pay

## 2020-07-31 DIAGNOSIS — M069 Rheumatoid arthritis, unspecified: Secondary | ICD-10-CM

## 2020-07-31 NOTE — Telephone Encounter (Signed)
RN spoke to [employer] on instructions. Spoke to patient. Patient states she is driving at present time an can RN place information into Mychart. RN informed the best plan is for patient to contact [employer] on the 08/06/20 for them to walk her through on how to disrupt  monitoring for that short period of time on 08/08/20 and to restart.  patient verbalized understanding. Patient also states she had carotid dopplers done

## 2020-07-31 NOTE — Telephone Encounter (Signed)
New Message:     Pt will start wearing a Monitor for 30 days and she is scheduled for a MRI on next Friday(08-08-20). She said they said she will need to remove the Monitor while she have the test. She wants to know if this will be a problem?

## 2020-08-01 ENCOUNTER — Encounter (INDEPENDENT_AMBULATORY_CARE_PROVIDER_SITE_OTHER): Payer: 59

## 2020-08-01 ENCOUNTER — Emergency Department (HOSPITAL_BASED_OUTPATIENT_CLINIC_OR_DEPARTMENT_OTHER): Payer: 59

## 2020-08-01 ENCOUNTER — Other Ambulatory Visit: Payer: Self-pay

## 2020-08-01 ENCOUNTER — Emergency Department (HOSPITAL_BASED_OUTPATIENT_CLINIC_OR_DEPARTMENT_OTHER)
Admission: EM | Admit: 2020-08-01 | Discharge: 2020-08-01 | Disposition: A | Discharge status: Home / Self Care | Payer: 59 | Attending: Emergency Medicine | Admitting: Emergency Medicine

## 2020-08-01 ENCOUNTER — Encounter (HOSPITAL_BASED_OUTPATIENT_CLINIC_OR_DEPARTMENT_OTHER): Payer: Self-pay | Admitting: *Deleted

## 2020-08-01 DIAGNOSIS — R202 Paresthesia of skin: Secondary | ICD-10-CM | POA: Diagnosis not present

## 2020-08-01 DIAGNOSIS — J45909 Unspecified asthma, uncomplicated: Secondary | ICD-10-CM | POA: Insufficient documentation

## 2020-08-01 DIAGNOSIS — I10 Essential (primary) hypertension: Secondary | ICD-10-CM | POA: Insufficient documentation

## 2020-08-01 DIAGNOSIS — Z79899 Other long term (current) drug therapy: Secondary | ICD-10-CM | POA: Diagnosis not present

## 2020-08-01 DIAGNOSIS — E1140 Type 2 diabetes mellitus with diabetic neuropathy, unspecified: Secondary | ICD-10-CM | POA: Diagnosis not present

## 2020-08-01 DIAGNOSIS — R079 Chest pain, unspecified: Secondary | ICD-10-CM | POA: Diagnosis not present

## 2020-08-01 DIAGNOSIS — R55 Syncope and collapse: Secondary | ICD-10-CM | POA: Diagnosis not present

## 2020-08-01 DIAGNOSIS — R42 Dizziness and giddiness: Secondary | ICD-10-CM

## 2020-08-01 DIAGNOSIS — R030 Elevated blood-pressure reading, without diagnosis of hypertension: Secondary | ICD-10-CM | POA: Diagnosis present

## 2020-08-01 LAB — CBC WITH DIFFERENTIAL/PLATELET
Abs Immature Granulocytes: 0.01 10*3/uL (ref 0.00–0.07)
Basophils Absolute: 0.1 10*3/uL (ref 0.0–0.1)
Basophils Relative: 1 %
Eosinophils Absolute: 0.2 10*3/uL (ref 0.0–0.5)
Eosinophils Relative: 2 %
HCT: 37.6 % (ref 36.0–46.0)
Hemoglobin: 12.1 g/dL (ref 12.0–15.0)
Immature Granulocytes: 0 %
Lymphocytes Relative: 35 %
Lymphs Abs: 2.4 10*3/uL (ref 0.7–4.0)
MCH: 27.8 pg (ref 26.0–34.0)
MCHC: 32.2 g/dL (ref 30.0–36.0)
MCV: 86.4 fL (ref 80.0–100.0)
Monocytes Absolute: 0.9 10*3/uL (ref 0.1–1.0)
Monocytes Relative: 13 %
Neutro Abs: 3.3 10*3/uL (ref 1.7–7.7)
Neutrophils Relative %: 49 %
Platelets: 174 10*3/uL (ref 150–400)
RBC: 4.35 MIL/uL (ref 3.87–5.11)
RDW: 13.2 % (ref 11.5–15.5)
WBC: 6.8 10*3/uL (ref 4.0–10.5)
nRBC: 0 % (ref 0.0–0.2)

## 2020-08-01 LAB — BASIC METABOLIC PANEL
Anion gap: 10 (ref 5–15)
BUN: 12 mg/dL (ref 8–23)
CO2: 29 mmol/L (ref 22–32)
Calcium: 9.1 mg/dL (ref 8.9–10.3)
Chloride: 96 mmol/L — ABNORMAL LOW (ref 98–111)
Creatinine, Ser: 0.7 mg/dL (ref 0.44–1.00)
GFR, Estimated: >60 mL/min (ref >60)
Glucose, Bld: 102 mg/dL — ABNORMAL HIGH (ref 70–99)
Potassium: 3.6 mmol/L (ref 3.5–5.1)
Sodium: 135 mmol/L (ref 135–145)

## 2020-08-01 LAB — TROPONIN I (HIGH SENSITIVITY): Troponin I (High Sensitivity): 6 ng/L (ref <18)

## 2020-08-01 MED ORDER — HYDRALAZINE HCL 25 MG PO TABS
25.0000 mg | ORAL_TABLET | Freq: Once | ORAL | Status: AC
  Administered 2020-08-01: 25 mg via ORAL
  Filled 2020-08-01: qty 1

## 2020-08-01 MED ORDER — MECLIZINE HCL 25 MG PO TABS: 25.0000 mg | ORAL_TABLET | Freq: Three times a day (TID) | ORAL | 0 refills | Status: DC | PRN

## 2020-08-01 MED ORDER — MECLIZINE HCL 25 MG PO TABS
25.0000 mg | ORAL_TABLET | Freq: Once | ORAL | Status: AC
  Administered 2020-08-01: 25 mg via ORAL
  Filled 2020-08-01: qty 1

## 2020-08-01 NOTE — ED Notes (Signed)
Patient denies pain and is resting comfortably.  

## 2020-08-01 NOTE — Discharge Instructions (Signed)
Please follow-up with your primary doctor regarding your blood pressure management.  Follow-up with neurology and cardiology as previously planned.  If you develop chest pain, worsening dizziness, uncontrolled headaches or other new concerning symptom return to ER for eval.

## 2020-08-01 NOTE — ED Triage Notes (Signed)
Dizzy intermittently x months. Scheduled to see neuro next month. Reports syncopal episode x 2 last month.  Reports tonight she had a dizzy episode that started at 645pm-reports that she took her BP at home and it was 192/99 HR 71. Denies CP, SOB tonight. Pt unsteady on her feet.

## 2020-08-02 ENCOUNTER — Encounter: Payer: Self-pay | Admitting: Family Medicine

## 2020-08-02 NOTE — ED Provider Notes (Signed)
[facility] EMERGENCY DEPARTMENT Provider Note   CSN: [id number] Arrival date & time: 08/01/20  1913     History Chief Complaint  Patient presents with  . Hypertension    [name] is a 65 y.o. female. Presents to ER with concern for elevated blood pressure. She reports over the past month or so has been having intermittent episodes of dizziness. Described as lightheadedness sensation, not room spinning sensation. Has had some associated mild headaches. Also had syncopal episode 12/30. Recently evaluated by cardiology and was started on a home heart monitor, unsure of results. Also referred to neurology, MRI brain scheduled outpatient for this coming week. Has not had any recent neuroimaging. Reports that this evening she was feeling like she was having one of her dizzy episodes. Again described as lightheadedness and not room spinning. Also noted tingling sensation on her left arm. No associated chest pain or difficulty in breathing. She checked her blood pressure at the time and it was elevated to [id number] systolic which is very abnormal for her. Has history of hypertension, no recent medication changes.  HPI     Past Medical History:  Diagnosis Date  . Allergic rhinitis   . Anxiety   . Asthma    hx bronchial asthma at times with upper resp infection  . Depression   . Fibromyalgia   . GERD (gastroesophageal reflux disease)   . Headache(784.0)    migraine and cluster headaches  . Hyperlipemia   . Hypertension   . IBS (irritable bowel syndrome)   . Menopause   . Neuromuscular disorder ([place]) 2009   hx of fibromyalgia, polyarthralsia (surgery induced)  . Osteoarthritis   . Rheumatoid arthritis ([place])    Complicated by osteoarthritis as well.  . Rheumatoid arthritis ([place])   . Stress incontinence    at times  . Tibial fracture 10/14/2016   evulsion periostial right  . Type 2 diabetes mellitus with complication, without long-term current use of insulin (HCC)     diet controlled no meds -> however was diagnosed with a diabetic foot ulcer.    Patient Active Problem List   Diagnosis Date Noted  . Inflammatory arthritis 07/22/2020  . Thrombocytopenia ([place]) 07/22/2020  . Chronic nonintractable headache 07/21/2020  . Syncope 07/21/2020  . Dizziness 07/21/2020  . Cellulitis of right hand 07/21/2020  . Lumbar spondylolysis 05/13/2020  . Menopausal symptom 03/11/2020  . Stress 03/11/2020  . Unspecified dyspareunia (CODE) 03/11/2020  . Bilateral sacroiliitis ([place]) 03/04/2020  . Senile calcific aortic valve sclerosis 02/08/2020  . DOE (dyspnea on exertion) 02/08/2020  . Newly recognized heart murmur 01/07/2020  . Acquired trigger finger of right ring finger 10/25/2019  . Pain in right knee 08/24/2019  . Rheumatoid arthritis involving multiple sites with positive rheumatoid factor ([place]) 08/02/2019  . Primary osteoarthritis of both knees 08/02/2019  . PTSD (post-traumatic stress disorder) 02/08/2019  . Dog bite of arm, right, initial encounter 10/05/2018  . Hyperlipidemia associated with type 2 diabetes mellitus ([place]) 08/24/2018  . Symptomatic abdominal panniculus 12/12/2017  . Carpal tunnel syndrome of left wrist 11/28/2017  . Pain in right hand 11/08/2017  . Cervical radiculopathy 10/06/2017  . 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 Keratosis 12/04/2014  . Onychocryptosis 08/21/2014  . Onychomycosis 05/21/2014  . Fissured skin 01/22/2014  . Fissure in skin of foot 12/18/2013  . Porokeratosis 12/18/2013  . Pain in lower limb 11/21/2013  . Ingrown nail 07/20/2013  . 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  Obesity (BMI 30-39.9) 06/25/2013  . GERD (gastroesophageal reflux disease) 08/20/2011  . Lapband APL + [facility] repair June 2011-Major revision Dec 2013 06/29/2011  . ABDOMINAL PAIN, ACUTE 08/01/2010  . VITAMIN B12 DEFICIENCY 04/01/2010  . BARIATRIC SURGERY STATUS 01/29/2010  . ONYCHOMYCOSIS, BILATERAL 06/30/2009  .  STRESS INCONTINENCE 06/10/2009  . ACUTE PHARYNGITIS 04/29/2009  . COUGH 04/27/2009  . BRONCHITIS, ACUTE 04/16/2009  . NAUSEA 04/08/2009  . DIARRHEA 04/08/2009  . MORBID OBESITY 03/03/2009  . SKIN RASH 11/18/2008  . UNSPECIFIED VITAMIN D DEFICIENCY 07/15/2008  . OTHER SPECIFIED ANEMIAS 07/15/2008  . Pain in joint, multiple sites 06/13/2008  . Myalgia and myositis, unspecified 06/13/2008  . BACK PAIN, LUMBAR 02/14/2008  . ACUTE SINUSITIS, UNSPECIFIED 09/01/2007  . CELLULITIS 08/12/2007  . Depression with anxiety 06/09/2007  . DEPRESSION 06/09/2007  . Controlled type 2 diabetes mellitus with diabetic neuropathy, without long-term current use of insulin ([place]) 01/04/2007  . Hypertension 01/04/2007  . ALLERGIC RHINITIS 01/04/2007  . HEADACHE 01/04/2007    Past Surgical History:  Procedure Laterality Date  . ABDOMINAL HYSTERECTOMY  12/1999   complete  . CARPAL TUNNEL RELEASE Right 12/27/2017   Procedure: CARPAL TUNNEL RELEASE;  Surgeon: Roseanne Kaufman, MD;  Location: [facility];  Service: Orthopedics;  Laterality: Right;  . CERVICAL CONIZATION W/BX  june 1990   dysplasia of cervix/used 5Fu cream for 3 months  . CERVICAL LAMINECTOMY  2005 & 2009   X 2   NO ROM PROBLEMS  . [place]  . DILATION AND CURETTAGE OF UTERUS   [phone]   missed abortion  . ESOPHAGOGASTRODUODENOSCOPY  06/29/2011   Procedure: ESOPHAGOGASTRODUODENOSCOPY (EGD);  Surgeon: Shann Medal, MD;  Location: [facility];  Service: General;  Laterality: N/A;  . FOOT SURGERY  2005   RT HEEL  . GASTRIC BANDING PORT REVISION  09/24/2011   Procedure: GASTRIC BANDING PORT REVISION;  Surgeon: Pedro Earls, MD;  Location: [facility];  Service: General;  Laterality: N/A;  . GASTRIC ROUX-EN-Y N/A 06/06/2017   Procedure: Conversion from Sleeve to [place];  Surgeon: Johnathan Hausen, MD;  Location: [facility];  Service: General;   Laterality: N/A;  . HYSTEROSCOPY  1999  . LAPAROSCOPIC GASTRIC BANDING  12/30/09  . LAPAROSCOPIC GASTRIC SLEEVE RESECTION N/A 07/02/2013   Procedure: LAPAROSCOPIC GASTRIC SLEEVE RESECTION upper endoscopy;  Surgeon: Pedro Earls, MD;  Location: [facility];  Service: General;  Laterality: N/A;  . LAPAROSCOPIC REPAIR AND REMOVAL OF GASTRIC BAND N/A 07/02/2013   Procedure: LAPAROSCOPIC REMOVAL OF GASTRIC BAND;  Surgeon: Pedro Earls, MD;  Location: [facility];  Service: General;  Laterality: N/A;  . LAPAROSCOPIC REVISION OF GASTRIC BAND  07/03/2012   Procedure: LAPAROSCOPIC REVISION OF GASTRIC BAND;  Surgeon: Pedro Earls, MD;  Location: [facility];  Service: General;  Laterality: N/A;  removal of old lap. band port, replaced with AP standard band  . LAPAROSCOPY  09/24/2011   Procedure: LAPAROSCOPY DIAGNOSTIC;  Surgeon: Pedro Earls, MD;  Location: [facility];  Service: General;  Laterality: N/A;  . LAPAROSCOPY  07/03/2012   Procedure: LAPAROSCOPY DIAGNOSTIC;  Surgeon: Pedro Earls, MD;  Location: [facility];  Service: General;  Laterality: N/A;  Exploratory Laparoscopy   . MESH APPLIED TO LAP PORT  07/03/2012   Procedure: MESH APPLIED TO LAP PORT;  Surgeon: Pedro Earls, MD;  Location: [facility];  Service: General;  Laterality: N/A;  .  [place]   X 2  . right knee  1981   ARTHROSCOPY AND ARTHROTOMY  . right knee arthroscopy and arthrotomy  12-1979  . TONSILLECTOMY  1971  . TUBAL LIGATION  1993   WITH C -SECTION     OB History    Gravida  3   Para  0   Term  0   Preterm  0   AB  1   Living  2     SAB  1   IAB  0   Ectopic  0   Multiple  0   Live Births              Family History  Problem Relation Age of Onset  . Diabetes Mother   . Stroke Mother   . Breast cancer Mother   . Atrial fibrillation Mother   . Hypertension Mother   . Cancer Mother 71       breast  . Diabetes Father   . Stroke Father 46       2nd 6 month apart  .  Hypertension Father   . Heart attack Father 30       stents  . Dementia Father   . AAA (abdominal aortic aneurysm) Father 35       repair  . CAD Father 54  . Thyroid disease Other   . Heart disease Other   . COPD Other     Social History   Tobacco Use  . Smoking status: Never Smoker  . Smokeless tobacco: Never Used  Vaping Use  . Vaping Use: Never used  Substance Use Topics  . Alcohol use: No    Alcohol/week: 0.0 standard drinks  . Drug use: No    Home Medications Prior to Admission medications   Medication Sig Start Date End Date Taking? Authorizing Provider  meclizine (ANTIVERT) 25 MG tablet Take 1 tablet (25 mg total) by mouth 3 (three) times daily as needed for dizziness. 08/01/20  Yes Lucrezia Starch, MD  acetaminophen (TYLENOL 8 HOUR) 650 MG CR tablet 2 tablets as needed    [provider]  ALPRAZolam (XANAX) 0.25 MG tablet Take 1 tablet (0.25 mg total) by mouth 2 (two) times daily as needed for anxiety. Patient not taking: Reported on 07/21/2020 08/02/19   Carollee Herter, Alferd Apa, DO  amitriptyline (ELAVIL) 25 MG tablet Take by mouth. 06/24/20   [provider]  Ca Phosphate-Cholecalciferol (CALTRATE GUMMY BITES PO) Take 2 each 2 (two) times daily by mouth.     [provider]  cephALEXin (KEFLEX) 500 MG capsule Take 1 capsule (500 mg total) by mouth 2 (two) times daily. 07/21/20   [name], DO  Colchicine 0.6 MG CAPS Take 1 capsule by mouth daily. 11/29/19   [provider]  Cyclobenzaprine HCl (FLEXERIL PO)     [provider]  diclofenac Sodium (VOLTAREN) 1 % GEL See admin instructions.    [provider]  DULoxetine (CYMBALTA) 30 MG capsule 1 capsule    [provider]  estradiol (VIVELLE-DOT) 0.1 MG/24HR Place 1 patch (0.1 mg total) onto the skin 2 (two) times a week. Monday and Thursday 06/27/12   [name], MD  Estradiol Acetate 0.1 MG/24HR RING See admin instructions.    [provider]  folic acid (FOLVITE) 1 MG tablet 1 tablet    [provider]  furosemide (LASIX) 20 MG tablet TAKE 1 TABLET BY MOUTH EVERY  MORNING 06/09/20   Roma Schanz R, DO  furosemide (LASIX) 20 MG tablet 1 tablet    [provider]  glucose blood test strip Test blood sugar once daily. Dx Code: E11.9 01/09/19   Carollee Herter, Alferd Apa, DO  hydroxychloroquine (PLAQUENIL) 200 MG tablet 200 mg 2 (two) times daily.  07/10/19   [provider]  leflunomide (ARAVA) 20 MG tablet Take 10 mg by mouth daily. Taking 10mg     [provider]  loratadine (CLARITIN) 10 MG tablet Take 10 mg by mouth every evening.    [provider]  methocarbamol (ROBAXIN) 500 MG tablet 1.5 tablets    [provider]  mupirocin ointment (BACTROBAN) 2 % Apply 1 application topically 2 (two) times daily. 02/14/20   Trula Slade, DPM  NONFORMULARY OR COMPOUNDED ITEM bp cuff  Dx hypertension 09/25/19   Carollee Herter, Alferd Apa, DO  nortriptyline (PAMELOR) 10 MG capsule Take by mouth. 07/07/20 09/05/20  [provider]  olmesartan (BENICAR) 5 MG tablet 1 tablet    [provider]  Pediatric Multivit-Minerals-C (ONE-A-DAY SCOOBY-DOO GUMMIES PO) Take 1 tablet 2 (two) times daily by mouth.     [provider]  potassium chloride (KLOR-CON) 10 MEQ tablet TAKE 1 TABLET (10 MEQ TOTAL) BY MOUTH DAILY. 08/02/19   [name], DO  pregabalin (LYRICA) 100 MG capsule     [provider]  PREMARIN vaginal cream Apply 1 application topically 2 (two) times a week. Mondays & Thursdays. 01/05/16   [provider]  rosuvastatin (CRESTOR) 20 MG tablet 1 po qd 01/07/20   Carollee Herter, Yvonne R, DO  temazepam (RESTORIL) 30 MG capsule Take 1 capsule (30 mg total) by mouth at bedtime as needed for sleep. 06/09/20   [name], DO  thiamine (VITAMIN B-1) 100 MG tablet Take 100 mg daily by mouth.    [provider]  traMADol  (ULTRAM) 50 MG tablet 1 tablet as needed    [provider]  UNABLE TO FIND Med Name: Actemra infusion 4 MF    [provider]    Allergies    Isoniazid, Nitrofurantoin, Hctz [hydrochlorothiazide], Oseltamivir phosphate, Promethazine hcl [promethazine hcl], Sulfa antibiotics, Tamiflu [oseltamivir phosphate], Gabapentin, Macrolides and ketolides, Morphine, Percocet [oxycodone-acetaminophen], Roxicet [oxycodone-acetaminophen], Sulfamethoxazole-trimethoprim, Tamiflu [oseltamivir], and Fentanyl  Review of Systems   Review of Systems  Constitutional: Negative for chills and fever.  HENT: Negative for ear pain and sore throat.   Eyes: Negative for pain and visual disturbance.  Respiratory: Negative for cough and shortness of breath.   Cardiovascular: Negative for chest pain and palpitations.  Gastrointestinal: Negative for abdominal pain and vomiting.  Genitourinary: Negative for dysuria and hematuria.  Musculoskeletal: Negative for arthralgias and back pain.  Skin: Negative for color change and rash.  Neurological: Positive for dizziness and headaches. Negative for seizures and syncope.  All other systems reviewed and are negative.   Physical Exam Updated Vital Signs BP (!) 146/80 (BP Location: Right Arm)   Pulse 66   Temp 97.8 F (36.6 C) (Oral)   Resp 18   Ht 5\' 11"  (1.803 m)   Wt 95.3 kg   SpO2 100%   BMI 29.30 kg/m   Physical Exam Vitals and nursing note reviewed.  Constitutional:      General: She is not in acute distress.    Appearance: She is well-developed and well-nourished.  HENT:     Head: Normocephalic and atraumatic.  Eyes:  Conjunctiva/sclera: Conjunctivae normal.  Cardiovascular:     Rate and Rhythm: Normal rate and regular rhythm.     Heart sounds: No murmur heard.   Pulmonary:     Effort: Pulmonary effort is normal. No respiratory distress.     Breath sounds: Normal breath sounds.  Abdominal:     Palpations: Abdomen is soft.      Tenderness: There is no abdominal tenderness.  Musculoskeletal:        General: No edema.     Cervical back: Neck supple.  Skin:    General: Skin is warm and dry.  Neurological:     General: No focal deficit present.     Mental Status: She is alert and oriented to person, place, and time.     Comments: AAOx3 CN 2-12 intact, speech clear visual fields intact 5/5 strength in b/l UE and LE Sensation to light touch intact in b/l UE and LE Normal FNF Normal gait  Psychiatric:        Mood and Affect: Mood and affect and mood normal.     ED Results / Procedures / Treatments   Labs (all labs ordered are listed, but only abnormal results are displayed) Labs Reviewed  BASIC METABOLIC PANEL - Abnormal; Notable for the following components:      Result Value   Chloride 96 (*)    Glucose, Bld 102 (*)    All other components within normal limits  CBC WITH DIFFERENTIAL/PLATELET  TROPONIN I (HIGH SENSITIVITY)    EKG EKG Interpretation  Date/Time:  Friday August 01 2020 19:34:09 EST Ventricular Rate:  70 PR Interval:    QRS Duration: 142 QT Interval:  453 QTC Calculation: 489 R Axis:   22 Text Interpretation: Sinus rhythm Right bundle branch block Confirmed by Madalyn Rob [phone]) on 08/01/2020 8:57:38 PM Also confirmed by Madalyn Rob [phone]), editor [place], [place] [phone])  on 08/02/2020 11:53:53 AM   Radiology DG Chest 2 View  Result Date: 08/01/2020 CLINICAL DATA:  Chest pain EXAM: CHEST - 2 VIEW COMPARISON:  07/26/2018 FINDINGS: Surgical hardware in the cervical spine. No focal opacity or pleural effusion. Electronic device over the left chest. Normal heart size. No pneumothorax. IMPRESSION: No active cardiopulmonary disease. Electronically Signed   By: Donavan Foil M.D.   On: 08/01/2020 20:43   CT Head Wo Contrast  Result Date: 08/01/2020 CLINICAL DATA:  Syncopal episode, dizzy EXAM: CT HEAD WITHOUT CONTRAST TECHNIQUE: Contiguous axial images were obtained from  the base of the skull through the vertex without intravenous contrast. COMPARISON:  None. FINDINGS: Brain: No acute territorial infarction, hemorrhage, or intracranial mass. The ventricles are nonenlarged. Vascular: No hyperdense vessels.  No unexpected calcification Skull: Normal. Negative for fracture or focal lesion. Sinuses/Orbits: No acute finding. Other: None IMPRESSION: Negative non contrasted CT appearance of the brain. Electronically Signed   By: Donavan Foil M.D.   On: 08/01/2020 20:26    Procedures Procedures (including critical care time)  Medications Ordered in ED Medications  hydrALAZINE (APRESOLINE) tablet 25 mg (25 mg Oral Given 08/01/20 2011)  meclizine (ANTIVERT) tablet 25 mg (25 mg Oral Given 08/01/20 2011)    ED Course  I have reviewed the triage vital signs and the nursing notes.  Pertinent labs & imaging results that were available during my care of the patient were reviewed by me and considered in my medical decision making (see chart for details).  Clinical Course as of 08/02/20 1458  Ludwig Clarks Aug 01, 2020  2104 Rechecked, no ongoing symptoms,  BP in [id number] systolic, will dc home [RD]    Clinical Course User Index [RD] Lucrezia Starch, MD   MDM Rules/Calculators/A&P                         65 year old lady presents to ER with concern for high blood pressure with associated lightheadedness, dizziness. On physical exam, patient noted to be well-appearing in no distress. Normal neurologic exam. BP elevated to 180/90 on arrival. Check basic labs including troponin as well as EKG and CT head. EKG without acute ischemic change and troponin within normal limits, doubt ACS. Given normal neurologic exam and negative head CT, low suspicion for acute neurologic process at this time. Patient was given modest dose of p.o. hydralazine and her blood pressure improved to 0000000 systolic. On reassessment, her symptoms had completely resolved. Given her current presentation and reassuring  work-up, believe she can be safely discharged at this time. Recommend she follow-up with both cardiology, neurology as previously planned as well as follow-up with primary doctor.    After the discussed management above, the patient was determined to be safe for discharge.  The patient was in agreement with this plan and all questions regarding their care were answered.  ED return precautions were discussed and the patient will return to the ED with any significant worsening of condition.   Final Clinical Impression(s) / ED Diagnoses Final diagnoses:  Hypertension, unspecified type  Dizziness    Rx / DC Orders ED Discharge Orders         Ordered    meclizine (ANTIVERT) 25 MG tablet  3 times daily PRN        08/01/20 2104           Lucrezia Starch, MD 08/02/20 1505

## 2020-08-04 ENCOUNTER — Other Ambulatory Visit (HOSPITAL_BASED_OUTPATIENT_CLINIC_OR_DEPARTMENT_OTHER): Payer: Self-pay

## 2020-08-04 ENCOUNTER — Telehealth: Payer: Self-pay | Admitting: Cardiology

## 2020-08-04 DIAGNOSIS — M797 Fibromyalgia: Secondary | ICD-10-CM | POA: Diagnosis not present

## 2020-08-04 DIAGNOSIS — M461 Sacroiliitis, not elsewhere classified: Secondary | ICD-10-CM | POA: Diagnosis not present

## 2020-08-04 DIAGNOSIS — M2550 Pain in unspecified joint: Secondary | ICD-10-CM | POA: Diagnosis not present

## 2020-08-04 DIAGNOSIS — M7918 Myalgia, other site: Secondary | ICD-10-CM | POA: Diagnosis not present

## 2020-08-04 MED FILL — CYCLOBENZAPRINE HCL 10 MG T: 10 | 30 days supply | Qty: 30 | Fill #0

## 2020-08-04 NOTE — Telephone Encounter (Signed)
° °  Pt c/o BP issue: STAT if pt c/o blurred vision, one-sided weakness or slurred speech  1. What are your last 5 BP readings?  08/04/20: 158/80  08/01/20: 175/103, 185/107, 181/103, 192/99, 185/97  2. Are you having any other symptoms (ex. Dizziness, headache, blurred vision, passed out)? Dizziness. Pt said it was worse before going to ER. Pt was given medication   3. What is your BP issue? Pt went to the ER  Friday with elevated BP. She was told to f/u with DR. Ellyn Hack but was hoping he could read the ER Notes from Friday and let her know what to do. She was nervous because the EKG mentioned a RBBB

## 2020-08-04 NOTE — Telephone Encounter (Signed)
Pt called stating she was seen in the ER on 1/21 due to elevated BP. Since discharged, pt report BP continue to remain elevated (BP listed below). She denies headache, blurred vision, but report occasional dizzy episodes.  1/21: 173/103, 185/107, 192/99, 185/97 1/24: 158/80  Will forward to MD for review.

## 2020-08-05 NOTE — Telephone Encounter (Signed)
The recommendations from MD to increase to 2 tablets.  20 mg  Glenetta Hew, MD

## 2020-08-05 NOTE — Telephone Encounter (Signed)
Pulmonary usual.  But it is possible that some of her dizziness spells are related to hypertension.  For now, for treatment options I would just recommend doubling up Benicar to 2 tablets a day.  If the blood pressure is high as it was on the 21st, would take an additional 2 tablets.-High would be greater than 786 mmHg systolic)  Glenetta Hew, MD

## 2020-08-05 NOTE — Telephone Encounter (Signed)
Pt updated with MD's recommendations and report currently taking benicar 10 mg daily since June, 2021. Pt state pcp increased medication but it was never updated in our chart.   Will forward to MD

## 2020-08-06 ENCOUNTER — Other Ambulatory Visit: Payer: Self-pay | Admitting: Cardiology

## 2020-08-06 MED ORDER — OLMESARTAN MEDOXOMIL 20 MG PO TABS: 20.0000 mg | ORAL_TABLET | Freq: Every day | ORAL | 3 refills | Status: DC

## 2020-08-06 MED FILL — OLMESARTAN MEDOXOMIL 20 MG: 20 | 90 days supply | Qty: 90 | Fill #0

## 2020-08-06 NOTE — Telephone Encounter (Signed)
Spoke with pt who was originally confused and confirmed she is indeed taking Benicar 10 mg daily. Pt advised to increase dose to 20 mg daily and monitor BP daily. Pt verbalized understanding.

## 2020-08-06 NOTE — Telephone Encounter (Signed)
Sorry for miscommunication. Pt report she has already been taking 2 tablet (20 mg) since June.

## 2020-08-06 NOTE — Telephone Encounter (Signed)
So if my original plan was to double up her dose regardless of what she is taking the plan will be to double her dose.  Initially I thought she is taking 5 mg-so I said make it 10, then it it appeared that she was taking 10 mg tablets so I said to take 20.  However she is actually taking 20 so therefore we should have her take a total of 40 mg tablet now.  We do need to watch her however because in the past her blood pressures when down dangerously low and we backed down to 5 mg according to my last note.  What I would recommend is that she takes 40 mg a day as long as her blood pressure stays above 517 systolic.  If it goes below that, then she should drop to 20 mg  Glenetta Hew, MD

## 2020-08-07 ENCOUNTER — Other Ambulatory Visit: Payer: 59

## 2020-08-07 ENCOUNTER — Other Ambulatory Visit: Payer: Self-pay | Admitting: Family Medicine

## 2020-08-07 ENCOUNTER — Ambulatory Visit: Payer: 59 | Admitting: Family Medicine

## 2020-08-07 ENCOUNTER — Encounter: Payer: Self-pay | Admitting: Family Medicine

## 2020-08-07 ENCOUNTER — Other Ambulatory Visit: Payer: Self-pay

## 2020-08-07 VITALS — BP 140/62 | HR 79 | Temp 98.5°F | Resp 18 | Ht 71.0 in | Wt 214.2 lb

## 2020-08-07 DIAGNOSIS — B354 Tinea corporis: Secondary | ICD-10-CM

## 2020-08-07 DIAGNOSIS — E785 Hyperlipidemia, unspecified: Secondary | ICD-10-CM

## 2020-08-07 DIAGNOSIS — M0579 Rheumatoid arthritis with rheumatoid factor of multiple sites without organ or systems involvement: Secondary | ICD-10-CM

## 2020-08-07 DIAGNOSIS — E1140 Type 2 diabetes mellitus with diabetic neuropathy, unspecified: Secondary | ICD-10-CM | POA: Diagnosis not present

## 2020-08-07 DIAGNOSIS — R748 Abnormal levels of other serum enzymes: Secondary | ICD-10-CM | POA: Diagnosis not present

## 2020-08-07 DIAGNOSIS — R42 Dizziness and giddiness: Secondary | ICD-10-CM

## 2020-08-07 DIAGNOSIS — M069 Rheumatoid arthritis, unspecified: Secondary | ICD-10-CM | POA: Diagnosis not present

## 2020-08-07 DIAGNOSIS — I10 Essential (primary) hypertension: Secondary | ICD-10-CM | POA: Diagnosis not present

## 2020-08-07 DIAGNOSIS — R55 Syncope and collapse: Secondary | ICD-10-CM | POA: Diagnosis not present

## 2020-08-07 DIAGNOSIS — E1169 Type 2 diabetes mellitus with other specified complication: Secondary | ICD-10-CM | POA: Diagnosis not present

## 2020-08-07 LAB — COMPREHENSIVE METABOLIC PANEL
ALT: 36 U/L — ABNORMAL HIGH (ref 0–35)
AST: 30 U/L (ref 0–37)
Albumin: 4.1 g/dL (ref 3.5–5.2)
Alkaline Phosphatase: 78 U/L (ref 39–117)
BUN: 11 mg/dL (ref 6–23)
CO2: 33 mEq/L — ABNORMAL HIGH (ref 19–32)
Calcium: 9.6 mg/dL (ref 8.4–10.5)
Chloride: 103 mEq/L (ref 96–112)
Creatinine, Ser: 0.89 mg/dL (ref 0.40–1.20)
GFR: 68.91 mL/min (ref >60.00)
Glucose, Bld: 102 mg/dL — ABNORMAL HIGH (ref 70–99)
Potassium: 4.6 mEq/L (ref 3.5–5.1)
Sodium: 141 mEq/L (ref 135–145)
Total Bilirubin: 0.4 mg/dL (ref 0.2–1.2)
Total Protein: 6.7 g/dL (ref 6.0–8.3)

## 2020-08-07 LAB — GAMMA GT: GGT: 24 U/L (ref 7–51)

## 2020-08-07 LAB — SEDIMENTATION RATE: Sed Rate: 6 mm/hr (ref 0–30)

## 2020-08-07 MED ORDER — NYSTATIN 100000 UNIT/GM EX POWD
1.0000 | Freq: Three times a day (TID) | CUTANEOUS | 5 refills | Status: DC
Start: 2020-08-07 — End: 2020-08-07

## 2020-08-07 MED FILL — NYSTATIN 100,000 UNIT/GM PO: 100000 | 5 days supply | Qty: 15 | Fill #0

## 2020-08-07 NOTE — Assessment & Plan Note (Signed)
Per rheum 

## 2020-08-07 NOTE — Assessment & Plan Note (Signed)
Lab Results  Component Value Date   HGBA1C 6.1 07/21/2020   Stable con't meds

## 2020-08-07 NOTE — Addendum Note (Signed)
Addended by: Kelle Darting A on: 08/07/2020 07:53 AM   Modules accepted: Orders

## 2020-08-07 NOTE — Assessment & Plan Note (Signed)
Lab Results  Component Value Date   CHOL 152 07/21/2020   HDL 82.80 07/21/2020   LDLCALC 54 07/21/2020   TRIG 77.0 07/21/2020   CHOLHDL 2 07/21/2020  Encouraged heart healthy diet, increase exercise, avoid trans fats, consider a krill oil cap daily

## 2020-08-07 NOTE — Progress Notes (Signed)
Patient ID: [name], female    DOB: [birth year]  Age: 65 y.o. MRN: [id number]    Subjective:  Subjective  HPI [name] presents for f/u er for bp.  She has cardiac event monitor on now and sees cardiology in March She has her mri tomorrow and sees jaffe 2/15.   Rheum took her off meds due to her liver enzymes.    She also c/o rash in folds of skin on abd  Review of Systems  Constitutional: Negative for appetite change, diaphoresis, fatigue and unexpected weight change.  Eyes: Negative for pain, redness and visual disturbance.  Respiratory: Negative for cough, chest tightness, shortness of breath and wheezing.   Cardiovascular: Negative for chest pain, palpitations and leg swelling.  Endocrine: Negative for cold intolerance, heat intolerance, polydipsia, polyphagia and polyuria.  Genitourinary: Negative for difficulty urinating, dysuria and frequency.  Musculoskeletal: Positive for arthralgias, joint swelling and myalgias.  Neurological: Negative for dizziness, light-headedness, numbness and headaches.    History Past Medical History:  Diagnosis Date  . Allergic rhinitis   . Anxiety   . Asthma    hx bronchial asthma at times with upper resp infection  . Depression   . Fibromyalgia   . GERD (gastroesophageal reflux disease)   . Headache(784.0)    migraine and cluster headaches  . Hyperlipemia   . Hypertension   . IBS (irritable bowel syndrome)   . Menopause   . Neuromuscular disorder ([place]) 2009   hx of fibromyalgia, polyarthralsia (surgery induced)  . Osteoarthritis   . Rheumatoid arthritis ([place])    Complicated by osteoarthritis as well.  . Rheumatoid arthritis ([place])   . Stress incontinence    at times  . Tibial fracture 10/14/2016   evulsion periostial right  . Type 2 diabetes mellitus with complication, without long-term current use of insulin (HCC)    diet controlled no meds -> however was diagnosed with a diabetic foot ulcer.    She has a past  surgical history that includes Cesarean section ([place]); Cervical laminectomy (2005 & 2009); Foot surgery (2005); right knee (1981); Nasal sinus surgery ([place]); Laparoscopic gastric banding (12/30/09); Esophagogastroduodenoscopy (06/29/2011); Hysteroscopy (1999); laparoscopy (09/24/2011); Gastric banding port revision (09/24/2011); Tonsillectomy (1971); Cholecystectomy (1986); Tubal ligation (1993); right knee arthroscopy and arthrotomy [id number]); Cervical conization w/bx (june 1990); laparoscopy (07/03/2012); Laparoscopic revision of gastric band (07/03/2012); Mesh applied to lap port (07/03/2012); Dilation and curettage of uterus ( april-1989); Laparoscopic repair and removal of gastric band (N/A, 07/02/2013); Laparoscopic gastric sleeve resection (N/A, 07/02/2013); Abdominal hysterectomy (12/1999); Gastric Roux-En-Y (N/A, 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   Her family history includes AAA (abdominal aortic aneurysm) (age of onset: 44) in her father; Atrial fibrillation in her mother; Breast cancer in her mother; CAD (age of onset: 63) in her father; COPD in an other family member; Cancer (age of onset: 8) in her mother; Dementia in her father; Diabetes in her father and mother; Heart attack (age of onset: 21) in her father; Heart disease in an other family member; Hypertension in her father and mother; Stroke in her mother; Stroke (age of onset: 36) in her father; Thyroid disease in an other family member.She reports that she has never smoked. She has never used smokeless tobacco. She reports that she does not drink alcohol and does not use drugs.  Current Outpatient Medications on File Prior to Visit  Medication Sig Dispense Refill  . acetaminophen (TYLENOL) 650 MG CR tablet 2 tablets as needed    .  ALPRAZolam (XANAX) 0.25 MG tablet Take 1 tablet (0.25 mg total) by mouth 2 (two) times daily as needed for anxiety. 20 tablet 0  . Ca Phosphate-Cholecalciferol (CALTRATE  GUMMY BITES PO) Take 2 each 2 (two) times daily by mouth.     . Cyclobenzaprine HCl (FLEXERIL PO)     . diclofenac Sodium (VOLTAREN) 1 % GEL See admin instructions.    . DULoxetine (CYMBALTA) 30 MG capsule 1 capsule    . estradiol (VIVELLE-DOT) 0.1 MG/24HR Place 1 patch (0.1 mg total) onto the skin 2 (two) times a week. Monday and Thursday 8 patch 11  . folic acid (FOLVITE) 1 MG tablet 1 tablet    . furosemide (LASIX) 20 MG tablet TAKE 1 TABLET BY MOUTH EVERY MORNING 90 tablet 1  . furosemide (LASIX) 20 MG tablet 1 tablet    . glucose blood test strip Test blood sugar once daily. Dx Code: E11.9 100 each 12  . hydroxychloroquine (PLAQUENIL) 200 MG tablet 200 mg 2 (two) times daily.     Marland Kitchen loratadine (CLARITIN) 10 MG tablet Take 10 mg by mouth every evening.    . meclizine (ANTIVERT) 25 MG tablet Take 1 tablet (25 mg total) by mouth 3 (three) times daily as needed for dizziness. 20 tablet 0  . methocarbamol (ROBAXIN) 500 MG tablet 1.5 tablets    . mupirocin ointment (BACTROBAN) 2 % Apply 1 application topically 2 (two) times daily. 30 g 2  . NALTREXONE HCL PO Take by mouth. Per Pt 1.5 MG daily hs x 30 days, then 4.5MG  daily hs x2 months    . NONFORMULARY OR COMPOUNDED ITEM bp cuff  Dx hypertension 1 each 0  . olmesartan (BENICAR) 20 MG tablet Take 1 tablet (20 mg total) by mouth daily. 90 tablet 3  . Pediatric Multivit-Minerals-C (ONE-A-DAY SCOOBY-DOO GUMMIES PO) Take 1 tablet 2 (two) times daily by mouth.     . potassium chloride (KLOR-CON) 10 MEQ tablet TAKE 1 TABLET (10 MEQ TOTAL) BY MOUTH DAILY. 90 tablet 1  . PREMARIN vaginal cream Apply 1 application topically 2 (two) times a week. Mondays & Thursdays.  1  . rosuvastatin (CRESTOR) 20 MG tablet 1 po qd 90 tablet 2  . temazepam (RESTORIL) 30 MG capsule Take 1 capsule (30 mg total) by mouth at bedtime as needed for sleep. 90 capsule 3  . thiamine (VITAMIN B-1) 100 MG tablet Take 100 mg daily by mouth.    . traMADol (ULTRAM) 50 MG tablet 1  tablet as needed    . UNABLE TO FIND Med Name: Actemra infusion 4 MF     No current facility-administered medications on file prior to visit.     Objective:  Objective  Physical Exam Vitals and nursing note reviewed.  Constitutional:      Appearance: She is well-developed and well-nourished.  HENT:     Head: Normocephalic and atraumatic.  Eyes:     Extraocular Movements: EOM normal.     Conjunctiva/sclera: Conjunctivae normal.  Neck:     Thyroid: No thyromegaly.     Vascular: No carotid bruit or JVD.  Cardiovascular:     Rate and Rhythm: Normal rate and regular rhythm.     Heart sounds: Murmur heard.    Pulmonary:     Effort: Pulmonary effort is normal. No respiratory distress.     Breath sounds: Normal breath sounds. No wheezing or rales.  Chest:     Chest wall: No tenderness.  Musculoskeletal:  General: No edema.     Cervical back: Normal range of motion and neck supple.  Skin:    Findings: Erythema and rash present.  Neurological:     Mental Status: She is alert and oriented to person, place, and time.  Psychiatric:        Mood and Affect: Mood and affect normal.      BP 140/62 (BP Location: Right Arm, Patient Position: Sitting, Cuff Size: Normal)   Pulse 79   Temp 98.5 F (36.9 C) (Oral)   Resp 18   Ht 5\' 11"  (1.803 m)   Wt 214 lb 3.2 oz (97.2 kg)   SpO2 97%   BMI 29.87 kg/m  Wt Readings from Last 3 Encounters:  08/07/20 214 lb 3.2 oz (97.2 kg)  08/01/20 210 lb 1.6 oz (95.3 kg)  07/21/20 210 lb (95.3 kg)     Lab Results  Component Value Date   WBC 6.8 08/01/2020   HGB 12.1 08/01/2020   HCT 37.6 08/01/2020   PLT 174 08/01/2020   GLUCOSE 102 (H) 08/07/2020   CHOL 152 07/21/2020   TRIG 77.0 07/21/2020   HDL 82.80 07/21/2020   LDLCALC 54 07/21/2020   ALT 36 (H) 08/07/2020   AST 30 08/07/2020   NA 141 08/07/2020   K 4.6 08/07/2020   CL 103 08/07/2020   CREATININE 0.89 08/07/2020   BUN 11 08/07/2020   CO2 33 (H) 08/07/2020   TSH 3.30  07/21/2020   HGBA1C 6.1 07/21/2020   MICROALBUR 0.8 08/02/2019    DG Chest 2 View  Result Date: 08/01/2020 CLINICAL DATA:  Chest pain EXAM: CHEST - 2 VIEW COMPARISON:  07/26/2018 FINDINGS: Surgical hardware in the cervical spine. No focal opacity or pleural effusion. Electronic device over the left chest. Normal heart size. No pneumothorax. IMPRESSION: No active cardiopulmonary disease. Electronically Signed   By: Donavan Foil M.D.   On: 08/01/2020 20:43   CT Head Wo Contrast  Result Date: 08/01/2020 CLINICAL DATA:  Syncopal episode, dizzy EXAM: CT HEAD WITHOUT CONTRAST TECHNIQUE: Contiguous axial images were obtained from the base of the skull through the vertex without intravenous contrast. COMPARISON:  None. FINDINGS: Brain: No acute territorial infarction, hemorrhage, or intracranial mass. The ventricles are nonenlarged. Vascular: No hyperdense vessels.  No unexpected calcification Skull: Normal. Negative for fracture or focal lesion. Sinuses/Orbits: No acute finding. Other: None IMPRESSION: Negative non contrasted CT appearance of the brain. Electronically Signed   By: Donavan Foil M.D.   On: 08/01/2020 20:26     Assessment & Plan:  Plan  I have discontinued Charmaine Downs. Siever's leflunomide, Colchicine, nortriptyline, cephALEXin, Estradiol Acetate, pregabalin, and amitriptyline. I am also having her start on nystatin. Additionally, I am having her maintain her loratadine, Ca Phosphate-Cholecalciferol (CALTRATE GUMMY BITES PO), Pediatric Multivit-Minerals-C (ONE-A-DAY SCOOBY-DOO GUMMIES PO), estradiol, Premarin, thiamine, glucose blood, hydroxychloroquine, ALPRAZolam, potassium chloride, NONFORMULARY OR COMPOUNDED ITEM, rosuvastatin, mupirocin ointment, UNABLE TO FIND, furosemide, temazepam, folic acid, acetaminophen, Cyclobenzaprine HCl (FLEXERIL PO), diclofenac Sodium, DULoxetine, furosemide, methocarbamol, traMADol, meclizine, olmesartan, and NALTREXONE HCL PO.  Meds ordered this encounter   Medications  . nystatin (NYSTATIN) powder    Sig: Apply 1 application topically 3 (three) times daily.    Dispense:  15 g    Refill:  5    Problem List Items Addressed This Visit      Unprioritized   Controlled type 2 diabetes mellitus with diabetic neuropathy, without long-term current use of insulin ([place])    Lab Results  Component Value  Date   HGBA1C 6.1 07/21/2020   Stable con't meds       Dizziness   Hyperlipidemia associated with type 2 diabetes mellitus (HCC) (Chronic)    Lab Results  Component Value Date   CHOL 152 07/21/2020   HDL 82.80 07/21/2020   LDLCALC 54 07/21/2020   TRIG 77.0 07/21/2020   CHOLHDL 2 07/21/2020  Encouraged heart healthy diet, increase exercise, avoid trans fats, consider a krill oil cap daily      Hypertension - Primary    Ok today--- cardiology just inc bystolic to 20 mg daily F/u 2-3 weeks bp check      Rheumatoid arthritis involving multiple sites with positive rheumatoid factor ([place])    Per rheum      Syncope    Wearing heart monitor now Mri brain tomorrow  Has app with neuro and cardiology in next few weeks No more episodes        Other Visit Diagnoses    Rheumatoid arthritis, involving unspecified site, unspecified whether rheumatoid factor present ([place])       Elevated liver enzymes       Tinea corporis       Relevant Medications   nystatin (NYSTATIN) powder      Follow-up: Return in about 2 weeks (around 08/21/2020), or nurse visit for bp check----f/u in office in 3 months.  [name], DO

## 2020-08-07 NOTE — Patient Instructions (Signed)

## 2020-08-07 NOTE — Assessment & Plan Note (Signed)
Wearing heart monitor now Mri brain tomorrow  Has app with neuro and cardiology in next few weeks No more episodes

## 2020-08-07 NOTE — Assessment & Plan Note (Signed)
[place] today--- cardiology just inc bystolic to 20 mg daily F/u 2-3 weeks bp check

## 2020-08-08 ENCOUNTER — Ambulatory Visit (HOSPITAL_COMMUNITY)
Admission: RE | Admit: 2020-08-08 | Discharge: 2020-08-08 | Disposition: A | Discharge status: Home / Self Care | Payer: 59 | Source: Ambulatory Visit | Attending: Family Medicine | Admitting: Family Medicine

## 2020-08-08 ENCOUNTER — Ambulatory Visit (HOSPITAL_COMMUNITY): Admission: RE | Admit: 2020-08-08 | Payer: 59 | Source: Ambulatory Visit

## 2020-08-08 DIAGNOSIS — R55 Syncope and collapse: Secondary | ICD-10-CM

## 2020-08-08 DIAGNOSIS — R519 Headache, unspecified: Secondary | ICD-10-CM | POA: Insufficient documentation

## 2020-08-08 DIAGNOSIS — R42 Dizziness and giddiness: Secondary | ICD-10-CM

## 2020-08-08 DIAGNOSIS — G8929 Other chronic pain: Secondary | ICD-10-CM

## 2020-08-18 ENCOUNTER — Other Ambulatory Visit (HOSPITAL_BASED_OUTPATIENT_CLINIC_OR_DEPARTMENT_OTHER): Payer: Self-pay | Admitting: Obstetrics and Gynecology

## 2020-08-18 DIAGNOSIS — N951 Menopausal and female climacteric states: Secondary | ICD-10-CM | POA: Diagnosis not present

## 2020-08-18 DIAGNOSIS — Z01419 Encounter for gynecological examination (general) (routine) without abnormal findings: Secondary | ICD-10-CM | POA: Diagnosis not present

## 2020-08-18 DIAGNOSIS — Z6830 Body mass index (BMI) 30.0-30.9, adult: Secondary | ICD-10-CM | POA: Diagnosis not present

## 2020-08-18 MED FILL — DOTTI 0.1 MG/24HR PTTW: 0.1 | 84 days supply | Qty: 24 | Fill #0

## 2020-08-18 MED FILL — PREMARIN VAGINAL CREAM-APPL: 0.625 | 60 days supply | Qty: 30 | Fill #0

## 2020-08-18 MED FILL — NYSTATIN 100,000 UNIT/GM PO: 100000 | 5 days supply | Qty: 15 | Fill #1

## 2020-08-25 NOTE — Progress Notes (Signed)
NEUROLOGY CONSULTATION NOTE  [name] MRN: [id number] DOB: 1955-10-26  Referring provider: Roma Schanz, DO Primary care provider: Roma Schanz, DO  Reason for consult:  Headache, dizziness   Subjective:  [name] is a 65 year old female with fibromyalgia, RA, HTN, IBS, depression and type 2 diabetes who presents for headaches and dizziness.  History supplemented by referring provider's notes.  Last year, she was tried on different immunosuppressant therapies for her RA.  In addition to her RA pain, she has fibromyalgia, osteoarthritis, lumbosacral spondylosis and sacroiliac pain, for which she is managed by pain medicine.  She began experiencing daily headaches in July.  The following month, she began experiencing dizziness.  She was found to have a new murmur and was referred to cardiology.  Echocardiogram in August was normal with EF 60-65%.  Murmur was thought to be secondary to mild aortic valve sclerosis.  The dizziness occurs spontaneously and not positional.  She will experience severe spinning and lightheadedness lasting several minutes and occurring off and on during the day.  It may occur with the headaches but also by itself.  Headaches are described as moderate pressure in a cap distribution, sometimes associated with nausea, photophobia and phonophobia, sometimes flickers or spots in her vision.  They last several hours, sometimes all day.  They may occur daily for a couple of weeks and then they may be episodic with one or two headache-free days in between.  On 07/09/2020, she had two syncopal spells that morning.  The first time, she walked into the kitchen and suddenly passed out.  She woke up and started walking out of the kitchen back to bed when she suddenly passed out again.  Witnessed by her daughter.  The events lasted about 30 seconds, no convulsions, tongue biting, palpitations or postictal confusion. However, she did have urinary incontinence.   Blood pressure was reportedly 92/52.  She is currently wearing a cardiac event monitor.  Carotid ultrasound on 07/30/2020 showed no hemodynamically significant ICA stenosis.  She went to the ED on 08/01/2020 for dizziness, left arm tingling and systolic BP 716R.  CT head personally reviewed was negative for acute abnormalities.  Basic labs and troponin were negative.  EKG showed new RBBB but nothing acute.  She later had MRI and MRA of brain on 08/08/2020 personally reviewed showed mild bilateral mastoid effusions but otherwise unremarkable.  Since then, blood pressure has been elevated.   She has remote history of migraines in 1999-2000.  They were severe headaches with diaphoresis, nausea, photophobia and phonophobia.  She was treated with propranolol for a time.  No history of seizures.  Current NSAIDS/analgesics:  Tylenol, tramadol Current triptans:  none Current ergotamine:  none Current anti-emetic:  none Current muscle relaxants:  Flexeril, Robaxin 500mg  Current Antihypertensive medications:  olmesartan Current Antidepressant medications:  Cymbalta 30mg  BID Current Anticonvulsant medications:  none Current anti-CGRP:  none Current Vitamins/Herbal/Supplements:  B1, KCl, MVI Current Antihistamines/Decongestants:  Meclizine, Claritin Other therapy:  none Hormone/birth control:  estradiol Other medications:  Plaquenil, alprazolam, Restoril  Past NSAIDS/analgesics:  none Past abortive triptans:  none Past abortive ergotamine:  none Past muscle relaxants:  none Past anti-emetic:  Zofran, Reglan, Phenergan Past antihypertensive medications:  Lasix Past antidepressant medications:  Amitriptyline, nortriptyline, citalopram Past anticonvulsant medications:  Gabapentin, Lyrica Past anti-CGRP:  none Past vitamins/Herbal/Supplements:  none Past antihistamines/decongestants:  none Other past therapies:  none  PAST MEDICAL HISTORY: Past Medical History:  Diagnosis Date  . Allergic rhinitis    .  Anxiety   . Asthma    hx bronchial asthma at times with upper resp infection  . Depression   . Fibromyalgia   . GERD (gastroesophageal reflux disease)   . Headache(784.0)    migraine and cluster headaches  . Hyperlipemia   . Hypertension   . IBS (irritable bowel syndrome)   . Menopause   . Neuromuscular disorder ([place]) 2009   hx of fibromyalgia, polyarthralsia (surgery induced)  . Osteoarthritis   . Rheumatoid arthritis ([place])    Complicated by osteoarthritis as well.  . Rheumatoid arthritis ([place])   . Stress incontinence    at times  . Tibial fracture 10/14/2016   evulsion periostial right  . Type 2 diabetes mellitus with complication, without long-term current use of insulin ([facility])    diet controlled no meds -> however was diagnosed with a diabetic foot ulcer.    PAST SURGICAL HISTORY: Past Surgical History:  Procedure Laterality Date  . ABDOMINAL HYSTERECTOMY  12/1999   complete  . CARPAL TUNNEL RELEASE Right 12/27/2017   Procedure: CARPAL TUNNEL RELEASE;  Surgeon: Roseanne Kaufman, MD;  Location: [place];  Service: Orthopedics;  Laterality: Right;  . CERVICAL CONIZATION W/BX  june 1990   dysplasia of cervix/used 5Fu cream for 3 months  . CERVICAL LAMINECTOMY  2005 & 2009   X 2   NO ROM PROBLEMS  . [place]  . DILATION AND CURETTAGE OF UTERUS   [phone]   missed abortion  . ESOPHAGOGASTRODUODENOSCOPY  06/29/2011   Procedure: ESOPHAGOGASTRODUODENOSCOPY (EGD);  Surgeon: Shann Medal, MD;  Location: [facility];  Service: General;  Laterality: N/A;  . FOOT SURGERY  2005   RT HEEL  . GASTRIC BANDING PORT REVISION  09/24/2011   Procedure: GASTRIC BANDING PORT REVISION;  Surgeon: Pedro Earls, MD;  Location: [facility];  Service: General;  Laterality: N/A;  . GASTRIC ROUX-EN-Y N/A 06/06/2017   Procedure: Conversion from [place]lee[place] to [place];  Surgeon: Johnathan Hausen, MD;   Location: [facility];  Service: General;  Laterality: N/A;  . HYSTEROSCOPY  1999  . LAPAROSCOPIC GASTRIC BANDING  12/30/09  . LAPAROSCOPIC GASTRIC SLEEVE RESECTION N/A 07/02/2013   Procedure: LAPAROSCOPIC GASTRIC SLEEVE RESECTION upper endoscopy;  Surgeon: Pedro Earls, MD;  Location: [facility];  Service: General;  Laterality: N/A;  . LAPAROSCOPIC REPAIR AND REMOVAL OF GASTRIC BAND N/A 07/02/2013   Procedure: LAPAROSCOPIC REMOVAL OF GASTRIC BAND;  Surgeon: Pedro Earls, MD;  Location: [facility];  Service: General;  Laterality: N/A;  . LAPAROSCOPIC REVISION OF GASTRIC BAND  07/03/2012   Procedure: LAPAROSCOPIC REVISION OF GASTRIC BAND;  Surgeon: Pedro Earls, MD;  Location: [facility];  Service: General;  Laterality: N/A;  removal of old lap. band port, replaced with AP standard band  . LAPAROSCOPY  09/24/2011   Procedure: LAPAROSCOPY DIAGNOSTIC;  Surgeon: Pedro Earls, MD;  Location: [facility];  Service: General;  Laterality: N/A;  . LAPAROSCOPY  07/03/2012   Procedure: LAPAROSCOPY DIAGNOSTIC;  Surgeon: Pedro Earls, MD;  Location: [facility];  Service: General;  Laterality: N/A;  Exploratory Laparoscopy   . MESH APPLIED TO LAP PORT  07/03/2012   Procedure: MESH APPLIED TO LAP PORT;  Surgeon: Pedro Earls, MD;  Location: [facility];  Service: General;  Laterality: N/A;  . [place]   X 2  . right knee  1981  ARTHROSCOPY AND ARTHROTOMY  . right knee arthroscopy and arthrotomy  12-1979  . TONSILLECTOMY  1971  . TUBAL LIGATION  1993   WITH C -SECTION    MEDICATIONS: Current Outpatient Medications on File Prior to Visit  Medication Sig Dispense Refill  . acetaminophen (TYLENOL) 650 MG CR tablet 2 tablets as needed    . ALPRAZolam (XANAX) 0.25 MG tablet Take 1 tablet (0.25 mg total) by mouth 2 (two) times daily as needed for anxiety. 20 tablet 0  . Ca Phosphate-Cholecalciferol (CALTRATE GUMMY BITES PO) Take 2 each 2 (two) times daily by mouth.     . Cyclobenzaprine HCl  (FLEXERIL PO)     . diclofenac Sodium (VOLTAREN) 1 % GEL See admin instructions.    . DULoxetine (CYMBALTA) 30 MG capsule 1 capsule    . estradiol (VIVELLE-DOT) 0.1 MG/24HR Place 1 patch (0.1 mg total) onto the skin 2 (two) times a week. Monday and Thursday 8 patch 11  . folic acid (FOLVITE) 1 MG tablet 1 tablet    . furosemide (LASIX) 20 MG tablet TAKE 1 TABLET BY MOUTH EVERY MORNING 90 tablet 1  . furosemide (LASIX) 20 MG tablet 1 tablet    . glucose blood test strip Test blood sugar once daily. Dx Code: E11.9 100 each 12  . hydroxychloroquine (PLAQUENIL) 200 MG tablet 200 mg 2 (two) times daily.     Marland Kitchen loratadine (CLARITIN) 10 MG tablet Take 10 mg by mouth every evening.    . meclizine (ANTIVERT) 25 MG tablet Take 1 tablet (25 mg total) by mouth 3 (three) times daily as needed for dizziness. 20 tablet 0  . methocarbamol (ROBAXIN) 500 MG tablet 1.5 tablets    . mupirocin ointment (BACTROBAN) 2 % Apply 1 application topically 2 (two) times daily. 30 g 2  . NALTREXONE HCL PO Take by mouth. Per Pt 1.5 MG daily hs x 30 days, then 4.5MG  daily hs x2 months    . NONFORMULARY OR COMPOUNDED ITEM bp cuff  Dx hypertension 1 each 0  . nystatin (NYSTATIN) powder Apply 1 application topically 3 (three) times daily. 15 g 5  . olmesartan (BENICAR) 20 MG tablet Take 1 tablet (20 mg total) by mouth daily. 90 tablet 3  . Pediatric Multivit-Minerals-C (ONE-A-DAY SCOOBY-DOO GUMMIES PO) Take 1 tablet 2 (two) times daily by mouth.     . potassium chloride (KLOR-CON) 10 MEQ tablet TAKE 1 TABLET (10 MEQ TOTAL) BY MOUTH DAILY. 90 tablet 1  . PREMARIN vaginal cream Apply 1 application topically 2 (two) times a week. Mondays & Thursdays.  1  . rosuvastatin (CRESTOR) 20 MG tablet 1 po qd 90 tablet 2  . temazepam (RESTORIL) 30 MG capsule Take 1 capsule (30 mg total) by mouth at bedtime as needed for sleep. 90 capsule 3  . thiamine (VITAMIN B-1) 100 MG tablet Take 100 mg daily by mouth.    . traMADol (ULTRAM) 50 MG tablet  1 tablet as needed    . UNABLE TO FIND Med Name: Actemra infusion 4 MF     No current facility-administered medications on file prior to visit.    ALLERGIES: Allergies  Allergen Reactions  . Isoniazid     Severe SOB  . Nitrofurantoin Shortness Of Breath    REACTION: welts  . Hctz [Hydrochlorothiazide]     rash  . Oseltamivir Phosphate Nausea And Vomiting    "I vomited within 30 minutes of taking it and was told I cannot ever take it again."  . Promethazine  Hcl [Promethazine Hcl]     IF GIVEN  IV-hallucinations     CAN TAKE PO PHENERGAN  . Sulfa Antibiotics Rash  . Tamiflu [Oseltamivir Phosphate] Nausea And Vomiting    "I vomited within 30 minutes of taking it and was told I cannot ever take it again."  . Gabapentin     Pt states she has eye swelling   . Macrolides And Ketolides     rash  . Morphine Other (See Comments)    severe vomiting  . Percocet [Oxycodone-Acetaminophen]     REACTION: severe itching. Can take by mouth codeine  . Roxicet [Oxycodone-Acetaminophen]     itching  . Sulfamethoxazole-Trimethoprim     Septra / Bactrim  --REACTION: rash  . Tamiflu [Oseltamivir]     Other reaction(s): Unknown  . Fentanyl Hives and Rash    REACTION: welts    FAMILY HISTORY: Family History  Problem Relation Age of Onset  . Diabetes Mother   . Stroke Mother   . Breast cancer Mother   . Atrial fibrillation Mother   . Hypertension Mother   . Cancer Mother 51       breast  . Diabetes Father   . Stroke Father 82       2nd 6 month apart  . Hypertension Father   . Heart attack Father 76       stents  . Dementia Father   . AAA (abdominal aortic aneurysm) Father 71       repair  . CAD Father 50  . Thyroid disease Other   . Heart disease Other   . COPD Other      SOCIAL HISTORY: Social History   Socioeconomic History  . Marital status: Married    Spouse name: Not on file  . Number of children: Not on file  . Years of education: Not on file  . Highest education  level: Not on file  Occupational History  . Occupation: Programmer, multimedia: [employer]  Tobacco Use  . Smoking status: Never Smoker  . Smokeless tobacco: Never Used  Vaping Use  . Vaping Use: Never used  Substance and Sexual Activity  . Alcohol use: No    Alcohol/week: 0.0 standard drinks  . Drug use: No  . Sexual activity: Yes    Birth control/protection: None  Other Topics Concern  . Not on file  Social History Narrative  . Not on file   Social Determinants of Health   Financial Resource Strain: Not on file  Food Insecurity: Not on file  Transportation Needs: Not on file  Physical Activity: Not on file  Stress: Not on file  Social Connections: Not on file  Intimate Partner Violence: Not on file    Objective:  Blood pressure (!) 175/74, pulse 90, resp. rate 18, height 5\' 11"  (1.803 m), weight 217 lb (98.4 kg), SpO2 98 %. General: No acute distress.  Patient appears well-groomed.   Head:  Normocephalic/atraumatic Eyes:  fundi examined but not visualized Neck: supple, no paraspinal tenderness, full range of motion Back: No paraspinal tenderness Heart: regular rate and rhythm Lungs: Clear to auscultation bilaterally. Vascular: No carotid bruits. Neurological Exam: Mental status: alert and oriented to person, place, and time, recent and remote memory intact, fund of knowledge intact, attention and concentration intact, speech fluent and not dysarthric, language intact. Cranial nerves: CN I: not tested CN II: pupils equal, round and reactive to light, visual fields intact CN III, IV, VI:  full range  of motion, no nystagmus, no ptosis CN V: facial sensation intact. CN VII: upper and lower face symmetric CN VIII: hearing intact CN IX, X: gag intact, uvula midline CN XI: sternocleidomastoid and trapezius muscles intact CN XII: tongue midline Bulk & Tone: normal, no fasciculations. Motor:  muscle strength 5/5 throughout Sensation:  Pinprick and vibratory  sensation reduced in feet. Deep Tendon Reflexes:  2+ throughout,  toes downgoing.   Finger to nose testing:  Without dysmetria.   Heel to shin:  Without dysmetria.   Gait:  Antalgic, wide-based.  Romberg negative.  Assessment/Plan:   1.  Headaches - probable migraines 2.  Dizziness - migraine symptom possible but I do not suspect the syncope is directly related to migraine. 3.  Syncope  I would treat for migraine.  If her other symptoms are related, then those symptoms (dizziness) should improve as well. 1.  Migraine prevention:  Emgality 2.  Migraine rescue:  Will have her try Ubrelvy 100mg  3.  Due to the syncope, will check EEG 4.  Follow up in 4 to 6 months.    Thank you for allowing me to take part in the care of this patient.  Metta Clines, DO  CC:  Roma Schanz, DO

## 2020-08-26 ENCOUNTER — Other Ambulatory Visit: Payer: Self-pay | Admitting: Neurology

## 2020-08-26 ENCOUNTER — Encounter: Payer: Self-pay | Admitting: Neurology

## 2020-08-26 ENCOUNTER — Ambulatory Visit: Payer: 59 | Admitting: Neurology

## 2020-08-26 ENCOUNTER — Other Ambulatory Visit: Payer: Self-pay

## 2020-08-26 VITALS — BP 175/74 | HR 90 | Resp 18 | Ht 71.0 in | Wt 217.0 lb

## 2020-08-26 DIAGNOSIS — R42 Dizziness and giddiness: Secondary | ICD-10-CM | POA: Diagnosis not present

## 2020-08-26 DIAGNOSIS — G43919 Migraine, unspecified, intractable, without status migrainosus: Secondary | ICD-10-CM | POA: Insufficient documentation

## 2020-08-26 DIAGNOSIS — R55 Syncope and collapse: Secondary | ICD-10-CM | POA: Diagnosis not present

## 2020-08-26 DIAGNOSIS — G43019 Migraine without aura, intractable, without status migrainosus: Secondary | ICD-10-CM

## 2020-08-26 MED ORDER — EMGALITY 120 MG/ML ~~LOC~~ SOAJ: 120.0000 mg | SUBCUTANEOUS | 5 refills | Status: DC

## 2020-08-26 NOTE — Patient Instructions (Signed)
1.  Start Emgality, 2 injections for first dose, then 1 injection every 28 days 2.  At earliest onset of headache, take Ubrelvy 100mg .  May repeat in 2 hours if needed.  Maximum 2 tablets in 24 hours.  If effective, contact me for prescription 3.  Check routine EEG 4.  Limit use of pain relievers to no more than 2 days out of week to prevent risk of rebound or medication-overuse headache. 5.  Keep headache diary 6.  Follow up 4 to 6 months.

## 2020-08-28 ENCOUNTER — Ambulatory Visit (INDEPENDENT_AMBULATORY_CARE_PROVIDER_SITE_OTHER): Payer: 59

## 2020-08-28 ENCOUNTER — Other Ambulatory Visit: Payer: Self-pay

## 2020-08-28 VITALS — BP 142/76 | HR 80

## 2020-08-28 DIAGNOSIS — I10 Essential (primary) hypertension: Secondary | ICD-10-CM | POA: Diagnosis not present

## 2020-08-28 NOTE — Progress Notes (Signed)
BP Readings from Last 3 Encounters:  08/26/20 (!) 175/74  08/07/20 140/62  08/01/20 (!) 146/80   Patient here for BP check after increase on Benicar from 10 mg to 20mg  on 08-06-20.  BP tosay is 142/76  Pulse 80. Per Dr. Etter Sjogren, patient was advised to continue taking Benicar 20 mg and to keep follow up appointment for 09/11/2020.

## 2020-09-02 ENCOUNTER — Other Ambulatory Visit (HOSPITAL_BASED_OUTPATIENT_CLINIC_OR_DEPARTMENT_OTHER): Payer: Self-pay

## 2020-09-02 DIAGNOSIS — M461 Sacroiliitis, not elsewhere classified: Secondary | ICD-10-CM | POA: Diagnosis not present

## 2020-09-02 DIAGNOSIS — M797 Fibromyalgia: Secondary | ICD-10-CM | POA: Diagnosis not present

## 2020-09-02 DIAGNOSIS — Z79899 Other long term (current) drug therapy: Secondary | ICD-10-CM | POA: Diagnosis not present

## 2020-09-02 DIAGNOSIS — M2550 Pain in unspecified joint: Secondary | ICD-10-CM | POA: Diagnosis not present

## 2020-09-02 DIAGNOSIS — Z5181 Encounter for therapeutic drug level monitoring: Secondary | ICD-10-CM | POA: Diagnosis not present

## 2020-09-02 DIAGNOSIS — M7918 Myalgia, other site: Secondary | ICD-10-CM | POA: Diagnosis not present

## 2020-09-02 MED FILL — DULoxetine HCL 30 MG CPEP: 30 | 30 days supply | Qty: 120 | Fill #0

## 2020-09-03 ENCOUNTER — Other Ambulatory Visit: Payer: Self-pay

## 2020-09-03 ENCOUNTER — Ambulatory Visit (INDEPENDENT_AMBULATORY_CARE_PROVIDER_SITE_OTHER): Payer: 59 | Admitting: Neurology

## 2020-09-03 DIAGNOSIS — R55 Syncope and collapse: Secondary | ICD-10-CM

## 2020-09-03 DIAGNOSIS — R42 Dizziness and giddiness: Secondary | ICD-10-CM

## 2020-09-03 DIAGNOSIS — G43019 Migraine without aura, intractable, without status migrainosus: Secondary | ICD-10-CM

## 2020-09-03 MED FILL — EMGALITY 120 MG/ML SOAJ: 120 | 28 days supply | Qty: 1 | Fill #0

## 2020-09-04 NOTE — Procedures (Signed)
ELECTROENCEPHALOGRAM REPORT  Date of Study: 09/03/2020  Patient's Name: [name] MRN: [id number] Date of Birth: [birth year]   Clinical History: 65 year old female with syncope  Medications: Cymbalta Tramadol Alprazolam Restoril Robaxin Flexeril Plaquenil   Technical Summary: A multichannel digital EEG recording measured by the international 10-20 system with electrodes applied with paste and impedances below 5000 ohms performed in our laboratory with EKG monitoring in an awake and asleep patient.  Hyperventilation not performed as patient is wearing a face mask due to the COVID-19 pandemic.  Photic stimulation was performed.  The digital EEG was referentially recorded, reformatted, and digitally filtered in a variety of bipolar and referential montages for optimal display.    Description: The patient is awake and asleep during the recording.  During maximal wakefulness, there is a symmetric, medium voltage 12 Hz posterior dominant rhythm that attenuates with eye opening.  The record is symmetric.  During drowsiness and sleep, there is an increase in theta slowing of the background.  Vertex waves and symmetric sleep spindles were seen.  Hyperventilation and photic stimulation did not elicit any abnormalities.  There were no epileptiform discharges or electrographic seizures seen.    EKG lead was unremarkable.  Impression: This awake and asleep EEG is normal.    Clinical Correlation: A normal EEG does not exclude a clinical diagnosis of epilepsy.  If further clinical questions remain, prolonged EEG may be helpful.  Clinical correlation is advised.   Metta Clines, DO

## 2020-09-05 ENCOUNTER — Encounter: Payer: Self-pay | Admitting: Neurology

## 2020-09-05 NOTE — Progress Notes (Signed)
Raymond Craton KeyJacklyn Shell - PA Case ID: [id number] - Rx #: [id number] Need help? Call us at [phone] Outcome Approvedtoday The request has been approved. The authorization is effective for a maximum of 1 fills from 09/05/2020 to 10/02/2020, as long as the member is enrolled in their current health plan. The request was reviewed and approved by a licensed clinical pharmacist. The request has been approved for 2 pens (7mL) per 28 days for the first fill. An additional authorization has been approved for Emgality 120mg /mL for 1 pen (45mL) per 28 days for 5 fills effective 09/28/2020 through 02/27/2021; please reference authorization [id number]. Renewal requires that you have experienced a reduction in migraine or headache frequency of at least 2 days per month OR have experienced a reduction in migraine severity or migraine duration with Emgality therapy. A written notification letter will follow with additional details. Drug Emgality 120MG /ML auto-injectors (migraine) Form [employer] ePA Form 2017 NCPDP Original Claim Info 75

## 2020-09-08 DIAGNOSIS — M0609 Rheumatoid arthritis without rheumatoid factor, multiple sites: Secondary | ICD-10-CM | POA: Diagnosis not present

## 2020-09-08 LAB — HEPATIC FUNCTION PANEL
ALT: 15 (ref 7–35)
AST: 19 (ref 13–35)
Alkaline Phosphatase: 83 (ref 25–125)
Bilirubin, Total: 0.3

## 2020-09-08 LAB — BASIC METABOLIC PANEL
BUN: 16 (ref 4–21)
CO2: 21 (ref 13–22)
Chloride: 100 (ref 99–108)
Creatinine: 0.8 (ref <=1.1)
Glucose: 162
Potassium: 4.3 (ref 3.4–5.3)
Sodium: 136 — AB (ref 137–147)

## 2020-09-08 LAB — TSH: TSH: 1.85 (ref <=5.90)

## 2020-09-08 LAB — VITAMIN D 25 HYDROXY (VIT D DEFICIENCY, FRACTURES): Vit D, 25-Hydroxy: 35.8

## 2020-09-08 LAB — COMPREHENSIVE METABOLIC PANEL
Albumin: 3.8 (ref 3.5–5.0)
Calcium: 8.8 (ref 8.7–10.7)
Globulin: 2.7

## 2020-09-08 LAB — CBC: RBC: 3.9 (ref 3.87–5.11)

## 2020-09-08 LAB — CBC AND DIFFERENTIAL
HCT: 33 — AB (ref 36–46)
Hemoglobin: 10.5 — AB (ref 12.0–16.0)
Platelets: 279 (ref 150–399)
WBC: 8

## 2020-09-08 LAB — HEPATITIS B SURFACE ANTIGEN: Hepatitis B Surface Ag: REACTIVE

## 2020-09-09 ENCOUNTER — Other Ambulatory Visit (HOSPITAL_BASED_OUTPATIENT_CLINIC_OR_DEPARTMENT_OTHER): Payer: Self-pay | Admitting: Rheumatology

## 2020-09-09 MED FILL — HYDROXYCHLOROQUINE 200 MG T: 200 | 30 days supply | Qty: 60 | Fill #0

## 2020-09-10 ENCOUNTER — Other Ambulatory Visit: Payer: Self-pay

## 2020-09-10 ENCOUNTER — Ambulatory Visit: Payer: 59 | Admitting: Cardiology

## 2020-09-10 ENCOUNTER — Other Ambulatory Visit: Payer: Self-pay | Admitting: Cardiology

## 2020-09-10 VITALS — BP 150/70 | HR 76 | Ht 71.0 in | Wt 217.4 lb

## 2020-09-10 DIAGNOSIS — R55 Syncope and collapse: Secondary | ICD-10-CM | POA: Diagnosis not present

## 2020-09-10 DIAGNOSIS — E1169 Type 2 diabetes mellitus with other specified complication: Secondary | ICD-10-CM

## 2020-09-10 DIAGNOSIS — E785 Hyperlipidemia, unspecified: Secondary | ICD-10-CM

## 2020-09-10 DIAGNOSIS — I350 Nonrheumatic aortic (valve) stenosis: Secondary | ICD-10-CM | POA: Diagnosis not present

## 2020-09-10 MED ORDER — HYDRALAZINE HCL 25 MG PO TABS: ORAL_TABLET | ORAL | 6 refills | Status: DC

## 2020-09-10 MED ORDER — OLMESARTAN MEDOXOMIL 20 MG PO TABS: 20.0000 mg | ORAL_TABLET | Freq: Every day | ORAL | 3 refills | Status: DC

## 2020-09-10 MED FILL — hydrALAZINE HCL 25 MG TABS: 25 | 15 days supply | Qty: 30 | Fill #0

## 2020-09-10 NOTE — Progress Notes (Signed)
Primary Care Provider: Carollee Herter, Alferd Apa, DO Cardiologist: Glenetta Hew, MD Electrophysiologist: None  Clinic Note: Chief Complaint  Patient presents with  . Loss of Consciousness    2 episodes on December 30  ===================================  ASSESSMENT/PLAN   Problem List Items Addressed This Visit    Syncope - Primary    Today, nothing is really shown anything to explain her episodes.  It did not sound like an arrhythmia.  Did not sound a seizure either.  Monitor and EEG have been negative.  Brain MRI/MRA was normal.  Thankfully, no further episodes besides that one dizzy spell which sounds very different than her passout spells.  My suspicion is that she had been lying down, probably little dehydrated, then stood up quickly to walk into the kitchen was orthostatic and passed out.  She then got back up again without fully stabilizing and passed out again.  At this point, we really do not have any other explanation.  Possible vasovagal component is also likely.  Recommendation for now on would be allow mild permissive hypertension.  We will adjust when she takes her medications.  She will change her Benicar dosing to nighttime.  For presumed permissive hypertension, systolic blood pressure range should be in the 140 to 150 mmHg.  I have provided prescription for hydralazine 25 mg as needed systolic pressures greater than 160 mmHg. I also discussed the importance of adequate hydration drinking 80 to 100 ounces of water a day, this is in addition to normal eating. Avoid diuretics, and avoid sodas/tea and coffee.        Relevant Orders   EKG 12-Lead (Completed)   Senile calcific aortic valve sclerosis (Chronic)    Not unexpectedly, aortic sclerosis murmur. Not likely related to syncope.      Relevant Medications   olmesartan (BENICAR) 20 MG tablet   hydrALAZINE (APRESOLINE) 25 MG tablet   Hyperlipidemia associated with type 2 diabetes mellitus ([place]) (Chronic)     She is currently on moderate dose of rosuvastatin.  Lipid panel looks outstanding.  Continue current therapy.      Relevant Medications   olmesartan (BENICAR) 20 MG tablet     ===================================  HPI:    [name] is a 65 y.o. female with a PMH notable for fibromyalgia, rheumatoid arthritis, labile HTN, DM-2, depression, IBS episodic dizziness who presents today for follow-up after wearing event monitor for evaluation of syncopal episode.  [name] was last seen on February 28, 2020 evaluation of heart murmur.  Echocardiogram mention mild aortic sclerosis but no stenosis.  Otherwise normal EF.  Mild LVH and GR 1 DD.  => she still noticing exertional dyspnea with exercise intolerance.  Also occasional tachycardia.  At that time her Benicar dose of been reduced to 5 mg, I did not increase because of her being exacerbating tension.  DOE thought to be related to MSK issues.  She was seen by Dr. Carollee Herter on July 21, 2020.  She noted at least 2 episodes of syncope December 30.  These occurred in the middle of the night.  She did not go to emergency room.  2 episodes about 10 minutes apart.  Unsure how long she was "unconscious ".  She awoke to discover that she had injured her right hand during the fall and had lost bladder control.  After the second episode, she was hypotensive at 92/52 mmHg which is low for her. => I recommended that she wear a 30-day monitor prior to  being seen for follow-up. -> She was seen again on January 27 after the ER visit.  BP was 140/62 mmHg.  Recent Hospitalizations:   08/01/2020-[facility] ER: Presented with elevated BP.  Also dizziness.  Lightheaded but no room spinning.  She felt she was having a dizzy episode described as lightheadedness.  Tingling sensation in her left arm.  No jaw pain or dyspnea.  Blood pressure was 485 systolic.  BP in the ER was 180/90 mmHg.  Normal head CT.-BP treated with hydralazine -> BP proved  to 462V systolic.  She was given a prescription for Antivert..  => Was told to increase Benicar to 20 mg daily.  When she saw neurology on 08/26/2020, indication was that she had 2 episodes on December 30.  She initially walked into the kitchen and passed out.  Upon waking up she went to walk out of the kitchen to the bed and passed out again.  The episode was witnessed by her daughter.  Each episode lasted about 30 seconds.  No convulsions.  She did however have a urinary incontinence.  BP was low to the initial episode 92/52 mmHg.  Carotid ultrasound with no significant stenosis, head CT unremarkable.  Blood pressure was 175/74 mmHg. -> Brain MRI showed mastoid effusions but otherwise normal; plan was to treat for migraine headaches and evaluate for seizure with EEG.   Reviewed  CV studies:    The following studies were reviewed today: (if available, images/films reviewed: From Epic Chart or Care Everywhere) . Echo 02/20/2020: EF 60 to 65%.  No R WMA.  GR 1 DD.  Normal longitudinal strain.  Mildly elevated RAP.  Mild aortic valve sclerosis but no stenosis.  Otherwise normal.  Event monitor from 08/01/2020-08/30/2020.  Normal sinus rhythm. Maximum. Average 78 bpm.  23 total events:  There was 1 4 beat run of PVCs (VT)-not noted on monitor  All of the manually detected events (symptoms of dizziness or lightheadedness) were associated with sinus rhythm, sinus tachycardia Wygesic with PACs.  Relatively normal monitor. Relatively normal monitor. Relatively normal monitor. No true sustained arrhythmias and SVT, A. fib, atrial flutter   Relatively normal monitor. No arrhythmias to explain the syncope.    Interval History:   [name] presents here now for cardiology evaluation of these syncopal episodes.   She pretty much she reiterated to me the same story noted above -> stated that the initial episode was at about 100 morning, she was watching TV, and her daughter called her into  the kitchen.  When she got up to walk to the kitchen, she did not quite make it all into the kitchen, and fell on the ground.  She does not recall what happened.  Phrases she is not able to tell me if she had any regular heartbeats or palpitations.  She did not feel lightheaded or dizzy, she just passed out.  She then got up feeling that she needed to just go back to bed, and while walking back to the bedroom, she passed out again.  She is very thrown off by the fact that her blood pressure was low at this time.  She had no convulsion-like activity at that time. What she has noted is that she sometimes may feel dizzy after sitting up or standing up after being sitting for long period time.  She has not had any further passout spells.  She has been having headache issues since this past summer.  They can be associated with  dizziness that occur spontaneously but not necessarily with vertigo type symptoms.  She does not really have that much or of nausea or photophobia etc.  Dyspnea, flickers.  She did have that one dizziness spell where she went to the emergency room, but has been fine since then. ->  CT of the head was normal.  She has had an MRI of the brain that showed some mild bilateral mastoid effusions but nothing on really remarkable.->  EEG was checked by neurology, with no episodes of seizures..  CV Review of Symptoms (Summary) Cardiovascular ROS: no chest pain or dyspnea on exertion positive for - loss of consciousness and Also dizziness, headache, but no tachycardia symptoms. negative for - edema, irregular heartbeat, orthopnea, palpitations, paroxysmal nocturnal dyspnea, rapid heart rate, shortness of breath or TIA or amaurosis fugax symptoms, claudication  The patient does not have symptoms concerning for COVID-19 infection (fever, chills, cough, or new shortness of breath).   REVIEWED OF SYSTEMS   Review of Systems  Constitutional: Negative for malaise/fatigue (Not really).  HENT:  Negative for congestion and nosebleeds.   Respiratory: Negative for shortness of breath.   Cardiovascular: Negative for leg swelling.  Gastrointestinal: Positive for abdominal pain and nausea. Negative for blood in stool and melena.  Genitourinary: Negative for hematuria.  Musculoskeletal: Positive for back pain, falls (Only with the one episode of syncope) and joint pain (Chronic arthritis pains).  Neurological: Positive for dizziness, loss of consciousness and headaches. Negative for focal weakness and seizures.       Per HPI  Psychiatric/Behavioral: Negative for depression and memory loss. The patient is nervous/anxious. The patient does not have insomnia.     I have reviewed and (if needed) personally updated the patient's problem list, medications, allergies, past medical and surgical history, social and family history.   PAST MEDICAL HISTORY   Past Medical History:  Diagnosis Date  . Allergic rhinitis   . Anxiety   . Asthma    hx bronchial asthma at times with upper resp infection  . Depression   . Fibromyalgia   . GERD (gastroesophageal reflux disease)   . Headache(784.0)    migraine and cluster headaches  . Hyperlipemia   . Hypertension   . IBS (irritable bowel syndrome)   . Menopause   . Neuromuscular disorder ([place]) 2009   hx of fibromyalgia, polyarthralsia (surgery induced)  . Osteoarthritis   . Rheumatoid arthritis ([place])    Complicated by osteoarthritis as well.  . Rheumatoid arthritis ([place])   . Stress incontinence    at times  . Tibial fracture 10/14/2016   evulsion periostial right  . Type 2 diabetes mellitus with complication, without long-term current use of insulin ([facility])    diet controlled no meds -> however was diagnosed with a diabetic foot ulcer.    PAST SURGICAL HISTORY   Past Surgical History:  Procedure Laterality Date  . ABDOMINAL HYSTERECTOMY  12/1999   complete  . CARPAL TUNNEL RELEASE Right 12/27/2017   Procedure: CARPAL TUNNEL RELEASE;   Surgeon: Roseanne Kaufman, MD;  Location: [facility];  Service: Orthopedics;  Laterality: Right;  . CERVICAL CONIZATION W/BX  june 1990   dysplasia of cervix/used 5Fu cream for 3 months  . CERVICAL LAMINECTOMY  2005 & 2009   X 2   NO ROM PROBLEMS  . [place]  . DILATION AND CURETTAGE OF UTERUS   [phone]   missed abortion  . ESOPHAGOGASTRODUODENOSCOPY  06/29/2011   Procedure: ESOPHAGOGASTRODUODENOSCOPY (EGD);  Surgeon: Shann Medal, MD;  Location: [facility];  Service: General;  Laterality: N/A;  . FOOT SURGERY  2005   RT HEEL  . GASTRIC BANDING PORT REVISION  09/24/2011   Procedure: GASTRIC BANDING PORT REVISION;  Surgeon: Pedro Earls, MD;  Location: [facility];  Service: General;  Laterality: N/A;  . GASTRIC ROUX-EN-Y N/A 06/06/2017   Procedure: Conversion from Sleeve to Bandana;  Surgeon: Johnathan Hausen, MD;  Location: [facility];  Service: General;  Laterality: N/A;  . HYSTEROSCOPY  1999  . LAPAROSCOPIC GASTRIC BANDING  12/30/09  . LAPAROSCOPIC GASTRIC SLEEVE RESECTION N/A 07/02/2013   Procedure: LAPAROSCOPIC GASTRIC SLEEVE RESECTION upper endoscopy;  Surgeon: Pedro Earls, MD;  Location: [facility];  Service: General;  Laterality: N/A;  . LAPAROSCOPIC REPAIR AND REMOVAL OF GASTRIC BAND N/A 07/02/2013   Procedure: LAPAROSCOPIC REMOVAL OF GASTRIC BAND;  Surgeon: Pedro Earls, MD;  Location: [facility];  Service: General;  Laterality: N/A;  . LAPAROSCOPIC REVISION OF GASTRIC BAND  07/03/2012   Procedure: LAPAROSCOPIC REVISION OF GASTRIC BAND;  Surgeon: Pedro Earls, MD;  Location: [facility];  Service: General;  Laterality: N/A;  removal of old lap. band port, replaced with AP standard band  . LAPAROSCOPY  09/24/2011   Procedure: LAPAROSCOPY DIAGNOSTIC;  Surgeon: Pedro Earls, MD;  Location: [facility];  Service: General;  Laterality: N/A;  . LAPAROSCOPY  07/03/2012   Procedure:  LAPAROSCOPY DIAGNOSTIC;  Surgeon: Pedro Earls, MD;  Location: [facility];  Service: General;  Laterality: N/A;  Exploratory Laparoscopy   . MESH APPLIED TO LAP PORT  07/03/2012   Procedure: MESH APPLIED TO LAP PORT;  Surgeon: Pedro Earls, MD;  Location: [facility];  Service: General;  Laterality: N/A;  . [place]   X 2  . right knee  1981   ARTHROSCOPY AND ARTHROTOMY  . right knee arthroscopy and arthrotomy  12-1979  . TONSILLECTOMY  1971  . [place]    Immunization History  Administered Date(s) Administered  . H1N1 07/23/2008  . Influenza Whole 04/24/2008  . Influenza,inj,Quad PF,6+ Mos 04/11/2013, 04/04/2019  . Influenza-Unspecified 04/25/2014, 03/13/2015, 03/12/2016, 03/25/2020  . PFIZER(Purple Top)SARS-COV-2 Vaccination 07/11/2019, 07/31/2019, 03/08/2020  . Pneumococcal Polysaccharide-23 07/04/2013  . Td 01/02/2009  . Tdap 10/04/2018  . Zoster Recombinat (Shingrix) 03/21/2017, 05/25/2017    MEDICATIONS/ALLERGIES   Current Meds  Medication Sig  . hydrALAZINE (APRESOLINE) 25 MG tablet Take if systolic blood pressure is equal or greater than 160 up to twice a day  . nystatin (NYSTATIN) powder Apply 1 application topically 3 (three) times daily.  Marland Kitchen Ubrogepant (UBRELVY) 100 MG TABS   . [DISCONTINUED] hydrALAZINE (APRESOLINE) 25 MG tablet Take if systolic blood pressure is equal or greater than 160.  . [DISCONTINUED] methocarbamol (ROBAXIN) 500 MG tablet 1.5 tablets    Allergies  Allergen Reactions  . Isoniazid     Severe SOB  . Nitrofurantoin Shortness Of Breath    REACTION: welts  . Hctz [Hydrochlorothiazide]     rash  . Oseltamivir Phosphate Nausea And Vomiting    "I vomited within 30 minutes of taking it and was told I cannot ever take it again."  . Promethazine Hcl [Promethazine Hcl]     IF GIVEN  IV-hallucinations     CAN TAKE PO PHENERGAN  . Sulfa Antibiotics Rash  . Tamiflu [Oseltamivir  Phosphate] Nausea  And Vomiting    "I vomited within 30 minutes of taking it and was told I cannot ever take it again."  . Gabapentin     Pt states she has eye swelling   . Macrolides And Ketolides     rash  . Morphine Other (See Comments)    severe vomiting  . Other Other (See Comments)    bandaids  . Percocet [Oxycodone-Acetaminophen]     REACTION: severe itching. Can take by mouth codeine  . Roxicet [Oxycodone-Acetaminophen]     itching  . Sulfamethoxazole-Trimethoprim     Septra / Bactrim  --REACTION: rash  . Tamiflu [Oseltamivir]     Other reaction(s): Unknown  . Fentanyl Hives and Rash    REACTION: welts    SOCIAL HISTORY/FAMILY HISTORY   Reviewed in Epic:  Pertinent findings:  Social History   Tobacco Use  . Smoking status: Never Smoker  . Smokeless tobacco: Never Used  Vaping Use  . Vaping Use: Never used  Substance Use Topics  . Alcohol use: No    Alcohol/week: 0.0 standard drinks  . Drug use: No   Social History   Social History Narrative   Right handed   Two story home   Drinks caffeine occasionally    OBJCTIVE -PE, EKG, labs   Wt Readings from Last 3 Encounters:  09/11/20 216 lb 3.2 oz (98.1 kg)  09/10/20 217 lb 6.4 oz (98.6 kg)  08/26/20 217 lb (98.4 kg)    Physical Exam: BP (!) 150/70 (BP Location: Right Arm, Patient Position: Supine, Cuff Size: Large)   Pulse 76   Ht 5\' 11"  (1.803 m)   Wt 217 lb 6.4 oz (98.6 kg)   BMI 30.32 kg/m  Physical Exam Vitals reviewed.  Constitutional:      General: She is not in acute distress.    Appearance: Normal appearance. She is obese. She is ill-appearing (Well-groomed.  Relatively healthy appearing). She is not toxic-appearing.  HENT:     Head: Normocephalic and atraumatic.  Neck:     Vascular: No carotid bruit or JVD.  Cardiovascular:     Rate and Rhythm: Normal rate and regular rhythm.  No extrasystoles are present.    Chest Wall: PMI is not displaced.     Pulses: Normal pulses.     Heart sounds: Heart sounds  are distant. Murmur (1/6 SEM at RUSB.) heard.  No friction rub. No gallop.      Comments: Normal S1, split S2 Musculoskeletal:        General: No swelling. Normal range of motion.     Cervical back: Normal range of motion and neck supple.  Skin:    General: Skin is warm and dry.  Neurological:     General: No focal deficit present.     Mental Status: She is alert and oriented to person, place, and time.  Psychiatric:        Mood and Affect: Mood normal.        Behavior: Behavior normal.        Thought Content: Thought content normal.        Judgment: Judgment normal.     Comments: Somewhat anxious, talkative     Adult ECG Report  Rate: 76;  Rhythm: normal sinus rhythm and ~incomplete RBBB.;   Narrative Interpretation: Relatively benign EKG.  Recent Labs: Reviewed Lab Results  Component Value Date   CHOL 152 07/21/2020   HDL 82.80 07/21/2020   LDLCALC 54 07/21/2020   TRIG 77.0  07/21/2020   CHOLHDL 2 07/21/2020   Lab Results  Component Value Date   CREATININE 0.8 09/08/2020   BUN 16 09/08/2020   NA 136 (A) 09/08/2020   K 4.3 09/08/2020   CL 100 09/08/2020   CO2 21 09/08/2020   CBC Latest Ref Rng & Units 09/11/2020 09/08/2020 08/01/2020  WBC 4.0 - 10.5 K/uL 5.3 8.0 6.8  Hemoglobin 12.0 - 15.0 g/dL 11.3(L) 10.5(A) 12.1  Hematocrit 36.0 - 46.0 % 35.1(L) 33(A) 37.6  Platelets 150.0 - 400.0 K/uL 312.0 279 174    Lab Results  Component Value Date   TSH 1.85 09/08/2020    ==================================================  COVID-19 Education: The signs and symptoms of COVID-19 were discussed with the patient and how to seek care for testing (follow up with PCP or arrange E-visit).   The importance of social distancing and COVID-19 vaccination was discussed today. The patient is practicing social distancing & Masking.   I spent a total of 12minutes with the patient spent in direct patient consultation.  Additional time spent with chart review  / charting (studies,  outside notes, etc): 15 min Total Time: 70 min   Current medicines are reviewed at length with the patient today.  (+/- concerns) N/A  This visit occurred during the SARS-CoV-2 public health emergency.  Safety protocols were in place, including screening questions prior to the visit, additional usage of staff PPE, and extensive cleaning of exam room while observing appropriate contact time as indicated for disinfecting solutions.  Notice: This dictation was prepared with Dragon dictation along with smaller phrase technology. Any transcriptional errors that result from this process are unintentional and may not be corrected upon review.  Patient Instructions / Medication Changes & Studies & Tests Ordered   Patient Instructions  Medication Instructions:    okay for blood pressure to range between 128-786 systolic  Hydralazine 25 mg  As needed -- Up to twice a day-- Take if systolic blood pressure is equal or greater than 160.     change an take Benicar at dinnertime     *If you need a refill on your cardiac medications before your next appointment, please call your pharmacy*   Lab Work:  NOT NEEDED   Testing/Procedures:  NOT NEEDED  Follow-Up: At [facility], you and your health needs are our priority.  As part of our continuing mission to provide you with exceptional heart care, we have created designated Provider Care Teams.  These Care Teams include your primary Cardiologist (physician) and Advanced Practice Providers (APPs -  Physician Assistants and Nurse Practitioners) who all work together to provide you with the care you need, when you need it.     Your next appointment:   5 month(s)  The format for your next appointment:   In Person  Provider:   Glenetta Hew, MD   Other Instructions . Stay Hydrated. - 80-100 ounces of water (or water based drinks -- not sodas, tea or coffee) per day.  DO NOT USE ANY DIRUETICS    Studies Ordered:   Orders Placed  This Encounter  Procedures  . EKG 12-Lead     Glenetta Hew, M.D., M.S. Interventional Cardiologist   Pager # [phone] Phone # [phone] [address] [place],  [address]   Thank you for choosing [facility] at [facility]!!

## 2020-09-10 NOTE — Patient Instructions (Addendum)
Medication Instructions:    okay for blood pressure to range between 961-164 systolic  Hydralazine 25 mg  As needed -- Up to twice a day-- Take if systolic blood pressure is equal or greater than 160.     change an take Benicar at dinnertime     *If you need a refill on your cardiac medications before your next appointment, please call your pharmacy*   Lab Work:  NOT NEEDED   Testing/Procedures:  NOT NEEDED  Follow-Up: At [facility], you and your health needs are our priority.  As part of our continuing mission to provide you with exceptional heart care, we have created designated Provider Care Teams.  These Care Teams include your primary Cardiologist (physician) and Advanced Practice Providers (APPs -  Physician Assistants and Nurse Practitioners) who all work together to provide you with the care you need, when you need it.     Your next appointment:   5 month(s)  The format for your next appointment:   In Person  Provider:   Glenetta Hew, MD   Other Instructions . Stay Hydrated. - 80-100 ounces of water (or water based drinks -- not sodas, tea or coffee) per day.  DO NOT USE ANY DIRUETICS

## 2020-09-11 ENCOUNTER — Ambulatory Visit: Payer: 59 | Admitting: Family Medicine

## 2020-09-11 ENCOUNTER — Encounter: Payer: Self-pay | Admitting: Family Medicine

## 2020-09-11 VITALS — BP 140/68 | HR 97 | Temp 98.1°F | Resp 18 | Wt 216.2 lb

## 2020-09-11 DIAGNOSIS — E785 Hyperlipidemia, unspecified: Secondary | ICD-10-CM | POA: Diagnosis not present

## 2020-09-11 DIAGNOSIS — E1169 Type 2 diabetes mellitus with other specified complication: Secondary | ICD-10-CM | POA: Diagnosis not present

## 2020-09-11 DIAGNOSIS — D649 Anemia, unspecified: Secondary | ICD-10-CM

## 2020-09-11 DIAGNOSIS — I10 Essential (primary) hypertension: Secondary | ICD-10-CM | POA: Diagnosis not present

## 2020-09-11 DIAGNOSIS — R0989 Other specified symptoms and signs involving the circulatory and respiratory systems: Secondary | ICD-10-CM | POA: Diagnosis not present

## 2020-09-11 DIAGNOSIS — E1140 Type 2 diabetes mellitus with diabetic neuropathy, unspecified: Secondary | ICD-10-CM

## 2020-09-11 LAB — CBC WITH DIFFERENTIAL/PLATELET
Basophils Absolute: 0.1 10*3/uL (ref 0.0–0.1)
Basophils Relative: 1.2 % (ref 0.0–3.0)
Eosinophils Absolute: 0.2 10*3/uL (ref 0.0–0.7)
Eosinophils Relative: 4.3 % (ref 0.0–5.0)
HCT: 35.1 % — ABNORMAL LOW (ref 36.0–46.0)
Hemoglobin: 11.3 g/dL — ABNORMAL LOW (ref 12.0–15.0)
Lymphocytes Relative: 29.9 % (ref 12.0–46.0)
Lymphs Abs: 1.6 10*3/uL (ref 0.7–4.0)
MCHC: 32.3 g/dL (ref 30.0–36.0)
MCV: 82.8 fl (ref 78.0–100.0)
Monocytes Absolute: 0.5 10*3/uL (ref 0.1–1.0)
Monocytes Relative: 9.8 % (ref 3.0–12.0)
Neutro Abs: 2.9 10*3/uL (ref 1.4–7.7)
Neutrophils Relative %: 54.8 % (ref 43.0–77.0)
Platelets: 312 10*3/uL (ref 150.0–400.0)
RBC: 4.24 Mil/uL (ref 3.87–5.11)
RDW: 14.4 % (ref 11.5–15.5)
WBC: 5.3 10*3/uL (ref 4.0–10.5)

## 2020-09-11 LAB — IBC + FERRITIN
Ferritin: 10.5 ng/mL (ref 10.0–291.0)
Iron: 33 ug/dL — ABNORMAL LOW (ref 42–145)
Saturation Ratios: 6.7 % — ABNORMAL LOW (ref 20.0–50.0)
Transferrin: 350 mg/dL (ref 212.0–360.0)

## 2020-09-11 MED FILL — FUROSEMIDE 20 MG TAB: 20 | 90 days supply | Qty: 90 | Fill #1

## 2020-09-11 NOTE — Assessment & Plan Note (Signed)
Labs from rheum reviewed

## 2020-09-11 NOTE — Assessment & Plan Note (Signed)
con't meds Cards started her on hydralazine prn

## 2020-09-11 NOTE — Progress Notes (Deleted)
Patient presents today for follow up of multiple medical problems.  Diabetes Type 2  Pt is currently maintained on the following medications for diabetes:***  Lab Results  Component Value Date   HGBA1C 6.1 07/21/2020   HGBA1C 6.3 01/07/2020   HGBA1C 6.3 08/02/2019    Lab Results  Component Value Date   MICROALBUR 0.8 08/02/2019   LDLCALC 54 07/21/2020   CREATININE 0.89 08/07/2020    Last diabetic eye exam was  Lab Results  Component Value Date   HMDIABEYEEXA No Retinopathy 04/02/2020   Denies polyuria/polydipsia. Denies hypoglycemia Home glucose readings range ***  Hyperlipidemia  Patient is currently maintained on the following medication for hyperlipidemia: *** Last lipid panel as follows:  Lab Results  Component Value Date   CHOL 152 07/21/2020   HDL 82.80 07/21/2020   LDLCALC 54 07/21/2020   TRIG 77.0 07/21/2020   CHOLHDL 2 07/21/2020   Patient denies myalgia. Patient reports good compliance with low fat/low cholesterol diet.   Hypertension  Patient is currently maintained on the following medications for blood pressure: *** Patient reports good compliance with blood pressure medications. Patient denies chest pain, shortness of breath or swelling. Last 3 blood pressure readings in our office are as follows: BP Readings from Last 3 Encounters:  09/11/20 140/68  09/10/20 (!) 150/70  08/28/20 (!) 142/76

## 2020-09-11 NOTE — Assessment & Plan Note (Signed)
Tolerating statin, encouraged heart healthy diet, avoid trans fats, minimize simple carbs and saturated fats. Increase exercise as tolerated 

## 2020-09-11 NOTE — Assessment & Plan Note (Signed)
Check cbc and ibc  Consider hematology referral

## 2020-09-11 NOTE — Progress Notes (Signed)
Patient ID: [name], female    DOB: 1956-06-24  Age: 65 y.o. MRN: [id number]    Subjective:  Subjective  HPI [name] presents for f/u bp , chol dm---   Pt saw cardiology yesterday--- pt had 4 beat run v tach but otherwise normal ----  She is on hydralazine prn if bp >160 She has labs from rheum today too.    Review of Systems  Constitutional: Negative for appetite change, diaphoresis, fatigue and unexpected weight change.  Eyes: Negative for pain, redness and visual disturbance.  Respiratory: Negative for cough, chest tightness, shortness of breath and wheezing.   Cardiovascular: Negative for chest pain, palpitations and leg swelling.  Endocrine: Negative for cold intolerance, heat intolerance, polydipsia, polyphagia and polyuria.  Genitourinary: Negative for difficulty urinating, dysuria and frequency.  Neurological: Negative for dizziness, light-headedness, numbness and headaches.    History Past Medical History:  Diagnosis Date  . Allergic rhinitis   . Anxiety   . Asthma    hx bronchial asthma at times with upper resp infection  . Depression   . Fibromyalgia   . GERD (gastroesophageal reflux disease)   . Headache(784.0)    migraine and cluster headaches  . Hyperlipemia   . Hypertension   . IBS (irritable bowel syndrome)   . Menopause   . Neuromuscular disorder ([place]) 2009   hx of fibromyalgia, polyarthralsia (surgery induced)  . Osteoarthritis   . Rheumatoid arthritis ([place])    Complicated by osteoarthritis as well.  . Rheumatoid arthritis ([place])   . Stress incontinence    at times  . Tibial fracture 10/14/2016   evulsion periostial right  . Type 2 diabetes mellitus with complication, without long-term current use of insulin (HCC)    diet controlled no meds -> however was diagnosed with a diabetic foot ulcer.    She has a past surgical history that includes Cesarean section ([place]); Cervical laminectomy (2005 & 2009); Foot surgery (2005); right  knee (1981); Nasal sinus surgery ([place]); Laparoscopic gastric banding (12/30/09); Esophagogastroduodenoscopy (06/29/2011); Hysteroscopy (1999); laparoscopy (09/24/2011); Gastric banding port revision (09/24/2011); Tonsillectomy (1971); Cholecystectomy (1986); Tubal ligation (1993); right knee arthroscopy and arthrotomy ([phone]); Cervical conization w/bx (june 1990); laparoscopy (07/03/2012); Laparoscopic revision of gastric band (07/03/2012); Mesh applied to lap port (07/03/2012); Dilation and curettage of uterus ( april-1989); Laparoscopic repair and removal of gastric band (N/A, 07/02/2013); Laparoscopic gastric sleeve resection (N/A, 07/02/2013); Abdominal hysterectomy (12/1999); Gastric Roux-En-Y (N/A, 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   Her family history includes AAA (abdominal aortic aneurysm) (age of onset: 25) in her father; Atrial fibrillation in her mother; Breast cancer in her mother; CAD (age of onset: 78) in her father; COPD in an other family member; Cancer (age of onset: 79) in her mother; Dementia in her father; Diabetes in her father and mother; Heart attack (age of onset: 5) in her father; Heart disease in an other family member; Hypertension in her father and mother; Stroke in her mother; Stroke (age of onset: 61) in her father; Thyroid disease in an other family member.She reports that she has never smoked. She has never used smokeless tobacco. She reports that she does not drink alcohol and does not use drugs.  Current Outpatient Medications on File Prior to Visit  Medication Sig Dispense Refill  . acetaminophen (TYLENOL) 650 MG CR tablet 2 tablets as needed    . Ca Phosphate-Cholecalciferol (CALTRATE GUMMY BITES PO) Take 2 each 2 (two) times daily by mouth.     Marland Kitchen  cyclobenzaprine (FLEXERIL) 10 MG tablet     . diclofenac Sodium (VOLTAREN) 1 % GEL See admin instructions.    . DULoxetine (CYMBALTA) 30 MG capsule 30 mg 2 (two) times daily.    Marland Kitchen estradiol  (VIVELLE-DOT) 0.1 MG/24HR Place 1 patch (0.1 mg total) onto the skin 2 (two) times a week. Monday and Thursday 8 patch 11  . folic acid (FOLVITE) 1 MG tablet 1 tablet    . furosemide (LASIX) 20 MG tablet TAKE 1 TABLET BY MOUTH EVERY MORNING 90 tablet 1  . Galcanezumab-gnlm (EMGALITY) 120 MG/ML SOAJ Inject 120 mg into the skin every 28 (twenty-eight) days. 1.12 mL 5  . glucose blood test strip Test blood sugar once daily. Dx Code: E11.9 100 each 12  . hydrALAZINE (APRESOLINE) 25 MG tablet Take if systolic blood pressure is equal or greater than 160 up to twice a day 60 tablet 6  . hydroxychloroquine (PLAQUENIL) 200 MG tablet 200 mg 2 (two) times daily.     Marland Kitchen leflunomide (ARAVA) 10 MG tablet Take 10 mg by mouth daily.    Marland Kitchen loratadine (CLARITIN) 10 MG tablet Take 10 mg by mouth every evening.    . meclizine (ANTIVERT) 25 MG tablet Take 1 tablet (25 mg total) by mouth 3 (three) times daily as needed for dizziness. 20 tablet 0  . mupirocin ointment (BACTROBAN) 2 % Apply 1 application topically 2 (two) times daily. 30 g 2  . NALTREXONE HCL PO 4.5 mg pt reported.    . NONFORMULARY OR COMPOUNDED ITEM bp cuff  Dx hypertension 1 each 0  . nystatin (NYSTATIN) powder Apply 1 application topically 3 (three) times daily. 15 g 5  . olmesartan (BENICAR) 20 MG tablet Take 1 tablet (20 mg total) by mouth daily with supper. 90 tablet 3  . Pediatric Multivit-Minerals-C (ONE-A-DAY SCOOBY-DOO GUMMIES PO) Take 1 tablet 2 (two) times daily by mouth.     . potassium chloride (KLOR-CON) 10 MEQ tablet TAKE 1 TABLET (10 MEQ TOTAL) BY MOUTH DAILY. 90 tablet 1  . PREMARIN vaginal cream Apply 1 application topically 2 (two) times a week. Mondays & Thursdays.  1  . rosuvastatin (CRESTOR) 20 MG tablet 1 po qd 90 tablet 2  . temazepam (RESTORIL) 30 MG capsule Take 1 capsule (30 mg total) by mouth at bedtime as needed for sleep. 90 capsule 3  . thiamine (VITAMIN B-1) 100 MG tablet Take 100 mg daily by mouth.    . Ubrogepant  (UBRELVY) 100 MG TABS     . UNABLE TO FIND Med Name: Actemra infusion 4 MF one dose monthly     No current facility-administered medications on file prior to visit.     Objective:  Objective  Physical Exam Vitals and nursing note reviewed.  Constitutional:      Appearance: She is well-developed and well-nourished.  HENT:     Head: Normocephalic and atraumatic.  Eyes:     Extraocular Movements: EOM normal.     Conjunctiva/sclera: Conjunctivae normal.  Neck:     Thyroid: No thyromegaly.     Vascular: No carotid bruit or JVD.  Cardiovascular:     Rate and Rhythm: Normal rate and regular rhythm.     Heart sounds: Normal heart sounds. No murmur heard.   Pulmonary:     Effort: Pulmonary effort is normal. No respiratory distress.     Breath sounds: Normal breath sounds. No wheezing or rales.  Chest:     Chest wall: No tenderness.  Musculoskeletal:  General: No edema.     Cervical back: Normal range of motion and neck supple.  Neurological:     Mental Status: She is alert and oriented to person, place, and time.  Psychiatric:        Mood and Affect: Mood and affect normal.    BP 140/68 (BP Location: Left Arm, Patient Position: Sitting, Cuff Size: Normal)   Pulse 97   Temp 98.1 F (36.7 C) (Oral)   Resp 18   Wt 216 lb 3.2 oz (98.1 kg)   SpO2 99%   BMI 30.15 kg/m  Wt Readings from Last 3 Encounters:  09/11/20 216 lb 3.2 oz (98.1 kg)  09/10/20 217 lb 6.4 oz (98.6 kg)  08/26/20 217 lb (98.4 kg)     Lab Results  Component Value Date   WBC 8.0 09/08/2020   HGB 10.5 (A) 09/08/2020   HCT 33 (A) 09/08/2020   PLT 279 09/08/2020   GLUCOSE 102 (H) 08/07/2020   CHOL 152 07/21/2020   TRIG 77.0 07/21/2020   HDL 82.80 07/21/2020   LDLCALC 54 07/21/2020   ALT 15 09/08/2020   AST 19 09/08/2020   NA 136 (A) 09/08/2020   K 4.3 09/08/2020   CL 100 09/08/2020   CREATININE 0.8 09/08/2020   BUN 16 09/08/2020   CO2 21 09/08/2020   TSH 1.85 09/08/2020   HGBA1C 6.1  07/21/2020   MICROALBUR 0.8 08/02/2019    MR Angiogram Head Wo Contrast  Result Date: 08/08/2020 CLINICAL DATA:  Chronic non intractable headache. Syncope, dizziness. EXAM: MRI HEAD WITHOUT CONTRAST MRA HEAD WITHOUT CONTRAST TECHNIQUE: Multiplanar, multiecho pulse sequences of the brain and surrounding structures were obtained without intravenous contrast. Angiographic images of the head were obtained using MRA technique without contrast. COMPARISON:  Head CT August 01, 2020. FINDINGS: MRI HEAD FINDINGS Brain: No acute infarction, hemorrhage, hydrocephalus, extra-axial collection or mass lesion. The brain parenchyma has normal morphology and signal characteristics. Vascular: Normal flow voids. Skull and upper cervical spine: Normal marrow signal. Sinuses/Orbits: Negative. Other: Mild bilateral mastoid effusion. MRA HEAD FINDINGS The visualized portions of the distal cervical and intracranial internal carotid arteries are widely patent with normal flow related enhancement. The bilateral anterior cerebral arteries and middle cerebral arteries are widely patent with antegrade flow without high-grade flow-limiting stenosis or proximal branch occlusion. No intracranial aneurysm within the anterior circulation. The vertebral arteries are widely patent with antegrade flow. The posterior inferior cerebral arteries are normal. Vertebrobasilar junction and basilar artery are widely patent with antegrade flow without evidence of basilar stenosis or aneurysm. Posterior cerebral arteries are normal bilaterally. No intracranial aneurysm within the posterior circulation. IMPRESSION: 1. Unremarkable MRI of the brain. 2. Unremarkable MRA of the head. No evidence of high-grade flow-limiting stenosis or proximal branch occlusion. 3. Mild bilateral mastoid effusion. Electronically Signed   By: Pedro Earls M.D.   On: 08/08/2020 17:01   MR Brain Wo Contrast  Result Date: 08/08/2020 CLINICAL DATA:  Chronic non  intractable headache. Syncope, dizziness. EXAM: MRI HEAD WITHOUT CONTRAST MRA HEAD WITHOUT CONTRAST TECHNIQUE: Multiplanar, multiecho pulse sequences of the brain and surrounding structures were obtained without intravenous contrast. Angiographic images of the head were obtained using MRA technique without contrast. COMPARISON:  Head CT August 01, 2020. FINDINGS: MRI HEAD FINDINGS Brain: No acute infarction, hemorrhage, hydrocephalus, extra-axial collection or mass lesion. The brain parenchyma has normal morphology and signal characteristics. Vascular: Normal flow voids. Skull and upper cervical spine: Normal marrow signal. Sinuses/Orbits: Negative. Other: Mild bilateral  mastoid effusion. MRA HEAD FINDINGS The visualized portions of the distal cervical and intracranial internal carotid arteries are widely patent with normal flow related enhancement. The bilateral anterior cerebral arteries and middle cerebral arteries are widely patent with antegrade flow without high-grade flow-limiting stenosis or proximal branch occlusion. No intracranial aneurysm within the anterior circulation. The vertebral arteries are widely patent with antegrade flow. The posterior inferior cerebral arteries are normal. Vertebrobasilar junction and basilar artery are widely patent with antegrade flow without evidence of basilar stenosis or aneurysm. Posterior cerebral arteries are normal bilaterally. No intracranial aneurysm within the posterior circulation. IMPRESSION: 1. Unremarkable MRI of the brain. 2. Unremarkable MRA of the head. No evidence of high-grade flow-limiting stenosis or proximal branch occlusion. 3. Mild bilateral mastoid effusion. Electronically Signed   By: Pedro Earls M.D.   On: 08/08/2020 17:01     Assessment & Plan:  Plan  I have discontinued Charmaine Downs. Templeman's ALPRAZolam, Cyclobenzaprine HCl (FLEXERIL PO), methocarbamol, traMADol, and Estradiol Acetate. I am also having her maintain her  loratadine, Ca Phosphate-Cholecalciferol (CALTRATE GUMMY BITES PO), Pediatric Multivit-Minerals-C (ONE-A-DAY SCOOBY-DOO GUMMIES PO), estradiol, Premarin, thiamine, glucose blood, hydroxychloroquine, potassium chloride, NONFORMULARY OR COMPOUNDED ITEM, rosuvastatin, mupirocin ointment, UNABLE TO FIND, furosemide, temazepam, folic acid, acetaminophen, diclofenac Sodium, DULoxetine, meclizine, nystatin, Emgality, Ubrelvy, olmesartan, hydrALAZINE, leflunomide, cyclobenzaprine, and NALTREXONE HCL PO.  No orders of the defined types were placed in this encounter.   Problem List Items Addressed This Visit      Unprioritized   Anemia - Primary    Check cbc and ibc  Consider hematology referral       Relevant Orders   CBC with Differential/Platelet   IBC + Ferritin   Controlled type 2 diabetes mellitus with diabetic neuropathy, without long-term current use of insulin ([place])    Labs from rheum reviewed       Hyperlipidemia associated with type 2 diabetes mellitus ([place]) (Chronic)    Tolerating statin, encouraged heart healthy diet, avoid trans fats, minimize simple carbs and saturated fats. Increase exercise as tolerated      Labile hypertension (Chronic)    con't meds Cards started her on hydralazine prn        Other Visit Diagnoses    Primary hypertension       Type 2 diabetes mellitus with other specified complication, without long-term current use of insulin ([place])          Follow-up: Return in about 6 months (around 03/14/2021), or if symptoms worsen or fail to improve, for annual exam, fasting.  [name], DO

## 2020-09-11 NOTE — Patient Instructions (Signed)
[employer] medicine (25th ed., pp. [phone]). [place], [place]: Elsevier.">  Anemia  Anemia is a condition in which there is not enough red blood cells or hemoglobin in the blood. Hemoglobin is a substance in red blood cells that carries oxygen. When you do not have enough red blood cells or hemoglobin (are anemic), your body cannot get enough oxygen and your organs may not work properly. As a result, you may feel very tired or have other problems. What are the causes? Common causes of anemia include:  Excessive bleeding. Anemia can be caused by excessive bleeding inside or outside the body, including bleeding from the intestines or from heavy menstrual periods in females.  Poor nutrition.  Long-lasting (chronic) kidney, thyroid, and liver disease.  Bone marrow disorders, spleen problems, and blood disorders.  Cancer and treatments for cancer.  HIV (human immunodeficiency virus) and AIDS (acquired immunodeficiency syndrome).  Infections, medicines, and autoimmune disorders that destroy red blood cells. What are the signs or symptoms? Symptoms of this condition include:  Minor weakness.  Dizziness.  Headache, or difficulties concentrating and sleeping.  Heartbeats that feel irregular or faster than normal (palpitations).  Shortness of breath, especially with exercise.  Pale skin, lips, and nails, or cold hands and feet.  Indigestion and nausea. Symptoms may occur suddenly or develop slowly. If your anemia is mild, you may not have symptoms. How is this diagnosed? This condition is diagnosed based on blood tests, your medical history, and a physical exam. In some cases, a test may be needed in which cells are removed from the soft tissue inside of a bone and looked at under a microscope (bone marrow biopsy). Your health care provider may also check your stool (feces) for blood and may do additional testing to look for the cause of your bleeding. Other tests may  include:  Imaging tests, such as a CT scan or MRI.  A procedure to see inside your esophagus and stomach (endoscopy).  A procedure to see inside your colon and rectum (colonoscopy). How is this treated? Treatment for this condition depends on the cause. If you continue to lose a lot of blood, you may need to be treated at a hospital. Treatment may include:  Taking supplements of iron, vitamin Q68, or folic acid.  Taking a hormone medicine (erythropoietin) that can help to stimulate red blood cell growth.  Having a blood transfusion. This may be needed if you lose a lot of blood.  Making changes to your diet.  Having surgery to remove your spleen. Follow these instructions at home:  Take over-the-counter and prescription medicines only as told by your health care provider.  Take supplements only as told by your health care provider.  Follow any diet instructions that you were given by your health care provider.  Keep all follow-up visits as told by your health care provider. This is important. Contact a health care provider if:  You develop new bleeding anywhere in the body. Get help right away if:  You are very weak.  You are short of breath.  You have pain in your abdomen or chest.  You are dizzy or feel faint.  You have trouble concentrating.  You have bloody stools, black stools, or tarry stools.  You vomit repeatedly or you vomit up blood. These symptoms may represent a serious problem that is an emergency. Do not wait to see if the symptoms will go away. Get medical help right away. Call your local emergency services (911 in the [place] Do not  drive yourself to the hospital. Summary  Anemia is a condition in which you do not have enough red blood cells or enough of a substance in your red blood cells that carries oxygen (hemoglobin).  Symptoms may occur suddenly or develop slowly.  If your anemia is mild, you may not have symptoms.  This condition is  diagnosed with blood tests, a medical history, and a physical exam. Other tests may be needed.  Treatment for this condition depends on the cause of the anemia. This information is not intended to replace advice given to you by your health care provider. Make sure you discuss any questions you have with your health care provider. Document Revised: 06/05/2019 Document Reviewed: 06/05/2019 Elsevier Patient Education  2021 [employer]vier Inc.  

## 2020-09-14 ENCOUNTER — Encounter: Payer: Self-pay | Admitting: Family Medicine

## 2020-09-29 DIAGNOSIS — G8929 Other chronic pain: Secondary | ICD-10-CM | POA: Diagnosis not present

## 2020-09-29 DIAGNOSIS — M549 Dorsalgia, unspecified: Secondary | ICD-10-CM | POA: Diagnosis not present

## 2020-09-29 DIAGNOSIS — M791 Myalgia, unspecified site: Secondary | ICD-10-CM | POA: Diagnosis not present

## 2020-09-29 DIAGNOSIS — M0609 Rheumatoid arthritis without rheumatoid factor, multiple sites: Secondary | ICD-10-CM | POA: Diagnosis not present

## 2020-09-29 DIAGNOSIS — M2550 Pain in unspecified joint: Secondary | ICD-10-CM | POA: Diagnosis not present

## 2020-09-29 DIAGNOSIS — Z79899 Other long term (current) drug therapy: Secondary | ICD-10-CM | POA: Diagnosis not present

## 2020-09-29 DIAGNOSIS — M79643 Pain in unspecified hand: Secondary | ICD-10-CM | POA: Diagnosis not present

## 2020-09-29 DIAGNOSIS — M797 Fibromyalgia: Secondary | ICD-10-CM | POA: Diagnosis not present

## 2020-09-29 DIAGNOSIS — M1990 Unspecified osteoarthritis, unspecified site: Secondary | ICD-10-CM | POA: Diagnosis not present

## 2020-10-01 DIAGNOSIS — M545 Low back pain, unspecified: Secondary | ICD-10-CM | POA: Diagnosis not present

## 2020-10-01 DIAGNOSIS — M47812 Spondylosis without myelopathy or radiculopathy, cervical region: Secondary | ICD-10-CM | POA: Diagnosis not present

## 2020-10-01 DIAGNOSIS — M5416 Radiculopathy, lumbar region: Secondary | ICD-10-CM | POA: Diagnosis not present

## 2020-10-01 DIAGNOSIS — M459 Ankylosing spondylitis of unspecified sites in spine: Secondary | ICD-10-CM | POA: Diagnosis not present

## 2020-10-03 ENCOUNTER — Telehealth: Payer: Self-pay | Admitting: Family Medicine

## 2020-10-03 NOTE — Telephone Encounter (Signed)
Forms faxed into front office Placed into [facility] folder

## 2020-10-06 ENCOUNTER — Encounter: Payer: Self-pay | Admitting: Cardiology

## 2020-10-06 NOTE — Assessment & Plan Note (Signed)
She is currently on moderate dose of rosuvastatin.  Lipid panel looks outstanding.  Continue current therapy.

## 2020-10-06 NOTE — Assessment & Plan Note (Signed)
Not unexpectedly, aortic sclerosis murmur. Not likely related to syncope.

## 2020-10-06 NOTE — Assessment & Plan Note (Signed)
Today, nothing is really shown anything to explain her episodes.  It did not sound like an arrhythmia.  Did not sound a seizure either.  Monitor and EEG have been negative.  Brain MRI/MRA was normal.  Thankfully, no further episodes besides that one dizzy spell which sounds very different than her passout spells.  My suspicion is that she had been lying down, probably little dehydrated, then stood up quickly to walk into the kitchen was orthostatic and passed out.  She then got back up again without fully stabilizing and passed out again.  At this point, we really do not have any other explanation.  Possible vasovagal component is also likely.  Recommendation for now on would be allow mild permissive hypertension.  We will adjust when she takes her medications.  She will change her Benicar dosing to nighttime.  For presumed permissive hypertension, systolic blood pressure range should be in the 140 to 150 mmHg.  I have provided prescription for hydralazine 25 mg as needed systolic pressures greater than 160 mmHg. I also discussed the importance of adequate hydration drinking 80 to 100 ounces of water a day, this is in addition to normal eating. Avoid diuretics, and avoid sodas/tea and coffee.

## 2020-10-06 NOTE — Telephone Encounter (Signed)
Please have patient set up a virtual to have these forms filled out correctly

## 2020-10-07 ENCOUNTER — Encounter: Payer: Self-pay | Admitting: Family Medicine

## 2020-10-07 ENCOUNTER — Telehealth: Payer: 59 | Admitting: Family Medicine

## 2020-10-07 ENCOUNTER — Other Ambulatory Visit: Payer: Self-pay

## 2020-10-07 ENCOUNTER — Telehealth (INDEPENDENT_AMBULATORY_CARE_PROVIDER_SITE_OTHER): Payer: 59 | Admitting: Family Medicine

## 2020-10-07 DIAGNOSIS — M461 Sacroiliitis, not elsewhere classified: Secondary | ICD-10-CM | POA: Diagnosis not present

## 2020-10-07 DIAGNOSIS — M5416 Radiculopathy, lumbar region: Secondary | ICD-10-CM | POA: Diagnosis not present

## 2020-10-07 DIAGNOSIS — M47812 Spondylosis without myelopathy or radiculopathy, cervical region: Secondary | ICD-10-CM

## 2020-10-07 DIAGNOSIS — M0579 Rheumatoid arthritis with rheumatoid factor of multiple sites without organ or systems involvement: Secondary | ICD-10-CM | POA: Diagnosis not present

## 2020-10-07 DIAGNOSIS — M4306 Spondylolysis, lumbar region: Secondary | ICD-10-CM | POA: Diagnosis not present

## 2020-10-07 DIAGNOSIS — M0609 Rheumatoid arthritis without rheumatoid factor, multiple sites: Secondary | ICD-10-CM | POA: Diagnosis not present

## 2020-10-07 NOTE — Progress Notes (Addendum)
Virtual telephone visit    Virtual Visit via Telephone Note   This visit type was conducted due to national recommendations for restrictions regarding the COVID-19 Pandemic (e.g. social distancing) in an effort to limit this patient's exposure and mitigate transmission in our community. Due to her co-morbid illnesses, this patient is at least at moderate risk for complications without adequate follow up. This format is felt to be most appropriate for this patient at this time. The patient did not have access to video technology or had technical difficulties with video requiring transitioning to audio format only (telephone). Physical exam was limited to content and character of the telephone converstion. [name] was able to get the patient set up on a telephone visit.   Patient location: home alone.  Patient and provider in visit Provider location: Office  I discussed the limitations of evaluation and management by telemedicine and the availability of in person appointments. The patient expressed understanding and agreed to proceed.   Visit Date: 10/07/2020  Today's healthcare provider: [name], DO     Subjective:    Patient ID: [name], female    DOB: 07/04/1956, 65 y.o.   MRN: [id number]  Chief Complaint  Patient presents with  . Paperwork    Pt needing long term disability forms filled out.     HPI Patient is in today for long term disability paperwork to be filled out No new complaints NS wrote her a note saying she was permanently disabled   Past Medical History:  Diagnosis Date  . Allergic rhinitis   . Anxiety   . Asthma    hx bronchial asthma at times with upper resp infection  . Depression   . Fibromyalgia   . GERD (gastroesophageal reflux disease)   . Headache(784.0)    migraine and cluster headaches  . Hyperlipemia   . Hypertension   . IBS (irritable bowel syndrome)   . Menopause   . Neuromuscular disorder ([place]) 2009    hx of fibromyalgia, polyarthralsia (surgery induced)  . Osteoarthritis   . Rheumatoid arthritis ([place])    Complicated by osteoarthritis as well.  . Rheumatoid arthritis ([place])   . Stress incontinence    at times  . Tibial fracture 10/14/2016   evulsion periostial right  . Type 2 diabetes mellitus with complication, without long-term current use of insulin (HCC)    diet controlled no meds -> however was diagnosed with a diabetic foot ulcer.    Past Surgical History:  Procedure Laterality Date  . ABDOMINAL HYSTERECTOMY  12/1999   complete  . CARPAL TUNNEL RELEASE Right 12/27/2017   Procedure: CARPAL TUNNEL RELEASE;  Surgeon: Roseanne Kaufman, MD;  Location: [facility];  Service: Orthopedics;  Laterality: Right;  . CERVICAL CONIZATION W/BX  june 1990   dysplasia of cervix/used 5Fu cream for 3 months  . CERVICAL LAMINECTOMY  2005 & 2009   X 2   NO ROM PROBLEMS  . [place]  . DILATION AND CURETTAGE OF UTERUS   [phone]   missed abortion  . ESOPHAGOGASTRODUODENOSCOPY  06/29/2011   Procedure: ESOPHAGOGASTRODUODENOSCOPY (EGD);  Surgeon: Shann Medal, MD;  Location: [facility];  Service: General;  Laterality: N/A;  . FOOT SURGERY  2005   RT HEEL  . GASTRIC BANDING PORT REVISION  09/24/2011   Procedure: GASTRIC BANDING PORT REVISION;  Surgeon: Pedro Earls, MD;  Location: [facility];  Service:  General;  Laterality: N/A;  . 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 HYSTEROSCOPY  1999  . LAPAROSCOPIC GASTRIC BANDING  12/30/09  . LAPAROSCOPIC GASTRIC SLEEVE RESECTION N/A 07/02/2013   Procedure: LAPAROSCOPIC GASTRIC SLEEVE RESECTION upper endoscopy;  Surgeon: Pedro Earls, MD;  [facility];  Service: General;  Laterality: N/A;  . LAPAROSCOPIC REPAIR AND  REMOVAL OF GASTRIC BAND N/A 07/02/2013   Procedure: LAPAROSCOPIC REMOVAL OF GASTRIC BAND;  Surgeon: Pedro Earls, MD;  [facility];  Service: General;  Laterality: N/A;  . LAPAROSCOPIC REVISION OF GASTRIC BAND  07/03/2012   Procedure: LAPAROSCOPIC REVISION OF GASTRIC BAND;  Surgeon: Pedro Earls, MD;  [facility];  Service: General;  Laterality: N/A;  removal of old lap. band port, replaced with AP standard band  . LAPAROSCOPY  09/24/2011   Procedure: LAPAROSCOPY DIAGNOSTIC;  Surgeon: Pedro Earls, MD;  [facility];  Service: General;  Laterality: N/A;  . LAPAROSCOPY  07/03/2012   Procedure: LAPAROSCOPY DIAGNOSTIC;  Surgeon: Pedro Earls, MD;  [facility];  Service: General;  Laterality: N/A;  Exploratory Laparoscopy   . MESH APPLIED TO LAP PORT  07/03/2012   Procedure: MESH APPLIED TO LAP PORT;  Surgeon: Pedro Earls, MD;  Location: [facility];  Service: General;  Laterality: N/A;  . [place]   X 2  . right knee  1981   ARTHROSCOPY AND ARTHROTOMY  . right knee arthroscopy and arthrotomy  12-1979  . TONSILLECTOMY  1971  . TUBAL LIGATION  1993   WITH C -SECTION    Family History  Problem Relation Age of Onset  . Diabetes Mother   . Stroke Mother   . Breast cancer Mother   . Atrial fibrillation Mother   . Hypertension Mother   . Cancer Mother 61       breast  . Diabetes Father   . Stroke Father 93       2nd 6 month apart  . Hypertension Father   . Heart attack Father 5       stents  . Dementia Father   . AAA (abdominal aortic aneurysm) Father 46       repair  . CAD Father 26  . Thyroid disease Other   . Heart disease Other   . COPD Other     Social History   Socioeconomic History  . Marital status: Married    Spouse name: Not on file  . Number of children: Not on file  . Years of education: Not on file  . Highest education level: Not on file  Occupational History  . Occupation: Programmer, multimedia: [employer]  Tobacco Use  . Smoking status: Never Smoker  . Smokeless tobacco: Never Used  Vaping Use  . Vaping Use: Never used  Substance and Sexual Activity  . Alcohol use: No    Alcohol/week: 0.0 standard drinks  . Drug use: No  . Sexual activity: Yes    Birth control/protection: None  Other Topics Concern  . Not on file  Social History Narrative   Right handed   Two story home   Drinks caffeine occasionally   Social Determinants of Health   Financial Resource Strain: Not on file  Food Insecurity: Not on file  Transportation Needs: Not on file  Physical Activity: Not on file  Stress: Not on file  Social Connections: Not on file  Intimate Partner Violence: Not on file    Outpatient Medications Prior to Visit  Medication Sig Dispense Refill  . acetaminophen (TYLENOL) 650 MG CR tablet 2 tablets as needed    . Ca Phosphate-Cholecalciferol (CALTRATE GUMMY BITES PO) Take 2 each 2 (two) times daily by mouth.     . cyclobenzaprine (FLEXERIL) 10 MG tablet     . diclofenac Sodium (VOLTAREN) 1 % GEL See admin instructions.    . DULoxetine (CYMBALTA) 30 MG capsule 30 mg 2 (two) times daily.    Marland Kitchen estradiol (VIVELLE-DOT) 0.1 MG/24HR Place 1 patch (0.1 mg total) onto the skin 2 (two) times a week. Monday and Thursday 8 patch 11  . folic acid (FOLVITE) 1 MG tablet 1 tablet    . furosemide (LASIX) 20 MG tablet TAKE 1 TABLET BY MOUTH EVERY MORNING 90 tablet 1  . Galcanezumab-gnlm (EMGALITY) 120 MG/ML SOAJ Inject 120 mg into the skin every 28 (twenty-eight) days. 1.12 mL 5  . glucose blood test strip Test blood sugar once daily. Dx Code: E11.9 100 each 12  . hydrALAZINE (APRESOLINE) 25 MG tablet Take if systolic blood pressure is equal or greater than 160 up to twice a day 60 tablet 6  . hydroxychloroquine (PLAQUENIL) 200 MG tablet 200 mg 2 (two) times daily.     Marland Kitchen leflunomide (ARAVA) 10 MG tablet Take 10 mg by mouth daily.    Marland Kitchen loratadine (CLARITIN) 10 MG tablet Take 10 mg by mouth  every evening.    . meclizine (ANTIVERT) 25 MG tablet Take 1 tablet (25 mg total) by mouth 3 (three) times daily as needed for dizziness. 20 tablet 0  . mupirocin ointment (BACTROBAN) 2 % Apply 1 application topically 2 (two) times daily. 30 g 2  . NALTREXONE HCL PO 4.5 mg pt reported.    . NONFORMULARY OR COMPOUNDED ITEM bp cuff  Dx hypertension 1 each 0  . nystatin (NYSTATIN) powder Apply 1 application topically 3 (three) times daily. 15 g 5  . olmesartan (BENICAR) 20 MG tablet Take 1 tablet (20 mg total) by mouth daily with supper. 90 tablet 3  . Pediatric Multivit-Minerals-C (ONE-A-DAY SCOOBY-DOO GUMMIES PO) Take 1 tablet 2 (two) times daily by mouth.     . potassium chloride (KLOR-CON) 10 MEQ tablet TAKE 1 TABLET (10 MEQ TOTAL) BY MOUTH DAILY. 90 tablet 1  . PREMARIN vaginal cream Apply 1 application topically 2 (two) times a week. Mondays & Thursdays.  1  . rosuvastatin (CRESTOR) 20 MG tablet 1 po qd 90 tablet 2  . temazepam (RESTORIL) 30 MG capsule Take 1 capsule (30 mg total) by mouth at bedtime as needed for sleep. 90 capsule 3  . thiamine (VITAMIN B-1) 100 MG tablet Take 100 mg daily by mouth.    . Ubrogepant (UBRELVY) 100 MG TABS     . UNABLE TO FIND Med Name: Actemra infusion 4 MF one dose monthly     No facility-administered medications prior to visit.    Allergies  Allergen Reactions  . Isoniazid     Severe SOB  . Nitrofurantoin Shortness Of Breath    REACTION: welts  . Hctz [Hydrochlorothiazide]     rash  . Oseltamivir Phosphate Nausea And Vomiting    "I vomited within 30 minutes of taking it and was told I cannot ever take it again."  . Promethazine Hcl [Promethazine  Hcl]     IF GIVEN  IV-hallucinations     CAN TAKE PO PHENERGAN  . Sulfa Antibiotics Rash  . Tamiflu [Oseltamivir Phosphate] Nausea And Vomiting    "I vomited within 30 minutes of taking it and was told I cannot ever take it again."  . Gabapentin     Pt states she has eye swelling   . Macrolides And  Ketolides     rash  . Morphine Other (See Comments)    severe vomiting  . Other Other (See Comments)    bandaids  . Percocet [Oxycodone-Acetaminophen]     REACTION: severe itching. Can take by mouth codeine  . Roxicet [Oxycodone-Acetaminophen]     itching  . Sulfamethoxazole-Trimethoprim     Septra / Bactrim  --REACTION: rash  . Tamiflu [Oseltamivir]     Other reaction(s): Unknown  . Fentanyl Hives and Rash    REACTION: welts    Review of Systems  Constitutional: Negative for chills, fever and malaise/fatigue.  HENT: Negative for congestion and hearing loss.   Eyes: Negative for discharge.  Respiratory: Negative for cough, sputum production and shortness of breath.   Cardiovascular: Negative for chest pain, palpitations and leg swelling.  Gastrointestinal: Negative for abdominal pain, blood in stool, constipation, diarrhea, heartburn, nausea and vomiting.  Genitourinary: Negative for dysuria, frequency, hematuria and urgency.  Musculoskeletal: Negative for back pain, falls and myalgias.  Skin: Negative for rash.  Neurological: Negative for dizziness, sensory change, loss of consciousness, weakness and headaches.  Endo/Heme/Allergies: Negative for environmental allergies. Does not bruise/bleed easily.  Psychiatric/Behavioral: Negative for depression and suicidal ideas. The patient is not nervous/anxious and does not have insomnia.        Objective:    Physical Exam Vitals and nursing note reviewed.  Constitutional:      Appearance: She is well-developed.  Pulmonary:     Effort: Pulmonary effort is normal.  Neurological:     Mental Status: She is alert and oriented to person, place, and time.  Psychiatric:        Mood and Affect: Mood normal.        Behavior: Behavior normal.        Thought Content: Thought content normal.        Judgment: Judgment normal.     There were no vitals taken for this visit. Wt Readings from Last 3 Encounters:  09/11/20 216 lb 3.2 oz  (98.1 kg)  09/10/20 217 lb 6.4 oz (98.6 kg)  08/26/20 217 lb (98.4 kg)    Diabetic Foot Exam - Simple   No data filed    Lab Results  Component Value Date   WBC 5.3 09/11/2020   HGB 11.3 (L) 09/11/2020   HCT 35.1 (L) 09/11/2020   PLT 312.0 09/11/2020   GLUCOSE 102 (H) 08/07/2020   CHOL 152 07/21/2020   TRIG 77.0 07/21/2020   HDL 82.80 07/21/2020   LDLCALC 54 07/21/2020   ALT 15 09/08/2020   AST 19 09/08/2020   NA 136 (A) 09/08/2020   K 4.3 09/08/2020   CL 100 09/08/2020   CREATININE 0.8 09/08/2020   BUN 16 09/08/2020   CO2 21 09/08/2020   TSH 1.85 09/08/2020   HGBA1C 6.1 07/21/2020   MICROALBUR 0.8 08/02/2019    Lab Results  Component Value Date   TSH 1.85 09/08/2020   Lab Results  Component Value Date   WBC 5.3 09/11/2020   HGB 11.3 (L) 09/11/2020   HCT 35.1 (L) 09/11/2020   MCV 82.8 09/11/2020  PLT 312.0 09/11/2020   Lab Results  Component Value Date   NA 136 (A) 09/08/2020   K 4.3 09/08/2020   CO2 21 09/08/2020   GLUCOSE 102 (H) 08/07/2020   BUN 16 09/08/2020   CREATININE 0.8 09/08/2020   BILITOT 0.4 08/07/2020   ALKPHOS 83 09/08/2020   AST 19 09/08/2020   ALT 15 09/08/2020   PROT 6.7 08/07/2020   ALBUMIN 3.8 09/08/2020   CALCIUM 8.8 09/08/2020   ANIONGAP 10 08/01/2020   GFR 68.91 08/07/2020   Lab Results  Component Value Date   CHOL 152 07/21/2020   Lab Results  Component Value Date   HDL 82.80 07/21/2020   Lab Results  Component Value Date   LDLCALC 54 07/21/2020   Lab Results  Component Value Date   TRIG 77.0 07/21/2020   Lab Results  Component Value Date   CHOLHDL 2 07/21/2020   Lab Results  Component Value Date   HGBA1C 6.1 07/21/2020       Assessment & Plan:   Problem List Items Addressed This Visit      Unprioritized   Bilateral sacroiliitis ([place])    Per neuro surgery and pain management  Disability paperwork filled out      Lumbar radiculopathy - Primary    Per neurosurgery/ pain management  Disability  papers filled out       Lumbar spondylolysis    Per neurosurgery / pain management       Rheumatoid arthritis involving multiple sites with positive rheumatoid factor [facility])    Per rheumatology Disability paperwork filled out      Spondylosis of cervical spine    Per neurosugery / pain        spent >25 min reviewing chart and filling out paperwork with the pt   I am having Charmaine Downs. Valls maintain her loratadine, Ca Phosphate-Cholecalciferol (CALTRATE GUMMY BITES PO), Pediatric Multivit-Minerals-C (ONE-A-DAY SCOOBY-DOO GUMMIES PO), estradiol, Premarin, thiamine, glucose blood, hydroxychloroquine, potassium chloride, NONFORMULARY OR COMPOUNDED ITEM, rosuvastatin, mupirocin ointment, UNABLE TO FIND, furosemide, temazepam, folic acid, acetaminophen, diclofenac Sodium, DULoxetine, meclizine, nystatin, Emgality, Ubrelvy, olmesartan, hydrALAZINE, leflunomide, cyclobenzaprine, and NALTREXONE HCL PO.  No orders of the defined types were placed in this encounter.    I discussed the assessment and treatment plan with the patient. The patient was provided an opportunity to ask questions and all were answered. The patient agreed with the plan and demonstrated an understanding of the instructions.   The patient was advised to call back or seek an in-person evaluation if the symptoms worsen or if the condition fails to improve as anticipated.     [name], DO [place] [facility] [employer] [facility] [phone] (phone) [phone] (fax)  [place]

## 2020-10-07 NOTE — Assessment & Plan Note (Signed)
Per rheumatology Disability paperwork filled out

## 2020-10-07 NOTE — Assessment & Plan Note (Signed)
Per neurosugery / pain

## 2020-10-07 NOTE — Telephone Encounter (Signed)
Forms faxed. Confirmation received. Original sent to scan.

## 2020-10-07 NOTE — Assessment & Plan Note (Signed)
Per neurosurgery/ pain management  Disability papers filled out

## 2020-10-07 NOTE — Assessment & Plan Note (Signed)
Per neurosurgery / pain management

## 2020-10-07 NOTE — Assessment & Plan Note (Signed)
Per neuro surgery and pain management  Disability paperwork filled out

## 2020-10-10 DIAGNOSIS — M2550 Pain in unspecified joint: Secondary | ICD-10-CM | POA: Diagnosis not present

## 2020-10-10 DIAGNOSIS — M79643 Pain in unspecified hand: Secondary | ICD-10-CM | POA: Diagnosis not present

## 2020-10-10 DIAGNOSIS — Z79899 Other long term (current) drug therapy: Secondary | ICD-10-CM | POA: Diagnosis not present

## 2020-10-10 DIAGNOSIS — M791 Myalgia, unspecified site: Secondary | ICD-10-CM | POA: Diagnosis not present

## 2020-10-10 DIAGNOSIS — M1990 Unspecified osteoarthritis, unspecified site: Secondary | ICD-10-CM | POA: Diagnosis not present

## 2020-10-10 DIAGNOSIS — M549 Dorsalgia, unspecified: Secondary | ICD-10-CM | POA: Diagnosis not present

## 2020-10-10 DIAGNOSIS — M0609 Rheumatoid arthritis without rheumatoid factor, multiple sites: Secondary | ICD-10-CM | POA: Diagnosis not present

## 2020-10-10 DIAGNOSIS — M797 Fibromyalgia: Secondary | ICD-10-CM | POA: Diagnosis not present

## 2020-10-10 DIAGNOSIS — G8929 Other chronic pain: Secondary | ICD-10-CM | POA: Diagnosis not present

## 2020-10-13 ENCOUNTER — Other Ambulatory Visit: Payer: Self-pay

## 2020-10-13 ENCOUNTER — Ambulatory Visit: Payer: 59 | Admitting: Podiatry

## 2020-10-13 ENCOUNTER — Encounter: Payer: Self-pay | Admitting: Podiatry

## 2020-10-13 DIAGNOSIS — E1142 Type 2 diabetes mellitus with diabetic polyneuropathy: Secondary | ICD-10-CM | POA: Diagnosis not present

## 2020-10-13 DIAGNOSIS — L84 Corns and callosities: Secondary | ICD-10-CM

## 2020-10-13 DIAGNOSIS — M2041 Other hammer toe(s) (acquired), right foot: Secondary | ICD-10-CM

## 2020-10-13 DIAGNOSIS — M2042 Other hammer toe(s) (acquired), left foot: Secondary | ICD-10-CM

## 2020-10-13 NOTE — Progress Notes (Signed)
Subjective:  Patient ID: [name], female    DOB: 18-Nov-1955,  MRN: [id number]  65 y.o. female presents with preventative diabetic foot care and corn(s) b/l feet  which interfere(s) with ambulation. Aggravating factors include wearing enclosed shoe gear. Pain is relieved with periodic professional debridement..    Patient states toes continue to do well with Aquaphor Ointment daily.  Patient states she has seen Dr. Ellyn Hack, her Cardiologist, who has added another medication: hydralazine,  for her blood pressure.  We reviewed her allergies and she states she is not allergic to acetaminophen  PCP: [name], DO and last visit was: 10/07/2020.  Review of Systems: Negative except as noted in the HPI.   Allergies  Allergen Reactions  . Acetaminophen     Other reaction(s): itching, whelps  . Isoniazid     Severe SOB  . Nitrofurantoin Shortness Of Breath    REACTION: welts  . Hctz [Hydrochlorothiazide]     rash  . Oseltamivir Phosphate Nausea And Vomiting    "I vomited within 30 minutes of taking it and was told I cannot ever take it again."  . Promethazine Hcl [Promethazine Hcl]     IF GIVEN  IV-hallucinations     CAN TAKE PO PHENERGAN  . Sulfa Antibiotics Rash  . Tamiflu [Oseltamivir Phosphate] Nausea And Vomiting    "I vomited within 30 minutes of taking it and was told I cannot ever take it again."  . Gabapentin     Pt states she has eye swelling   . Macrolides And Ketolides     rash  . Morphine Other (See Comments)    severe vomiting  . Other Other (See Comments)    bandaids  . Percocet [Oxycodone-Acetaminophen]     REACTION: severe itching. Can take by mouth codeine  . Roxicet [Oxycodone-Acetaminophen]     itching  . Sulfamethoxazole-Trimethoprim     Septra / Bactrim  --REACTION: rash  . Tamiflu [Oseltamivir]     Other reaction(s): Unknown  . Fentanyl Hives and Rash    REACTION: welts   Objective:  There were no vitals filed for this  visit. Constitutional Patient is a pleasant 65 y.o. Caucasian female in NAD. AAO x 3.  Vascular Capillary fill time to digits <3 seconds b/l lower extremities. Palpable pedal pulses b/l LE. Pedal hair sparse. Lower extremity skin temperature gradient within normal limits. No ischemia or gangrene noted b/l lower extremities. No cyanosis or clubbing noted. Mild edema right 5th digit.  Neurologic Normal speech. Pt has subjective symptoms of neuropathy. Protective sensation intact 5/5 intact bilaterally with 10g monofilament b/l. Vibratory sensation intact b/l.  Dermatologic Pedal skin with normal turgor, texture and tone bilaterally. No open wounds bilaterally. No interdigital macerations bilaterally. Toenails 1-5 b/l well maintained with adequate length. No erythema, no edema, no drainage, no fluctuance. Hyperkeratotic lesion(s) distal tip L 2nd toe, L 3rd toe and R 2nd toe.  No erythema, no edema, no drainage, no fluctuance.   Orthopedic: Normal muscle strength 5/5 to all lower extremity muscle groups bilaterally. No pain crepitus or joint limitation noted with ROM b/l. Hammertoes noted to the L 2nd toe, L 3rd toe, R 3rd toe, R 4th toe and R 5th toe.     Hemoglobin A1C Latest Ref Rng & Units 07/21/2020 01/07/2020  HGBA1C 4.6 - 6.5 % 6.1 6.3  Some recent data might be hidden    Assessment:   1. Corns   2. Acquired hammertoes of both feet   3.  Diabetic peripheral neuropathy associated with type 2 diabetes mellitus ([place])    Plan:  Patient was evaluated and treated and all questions answered.  -Examined patient. -Continue diabetic foot care principles. -Patient to continue soft, supportive shoe gear daily. -Corn(s) L 2nd toe, L3rd toe and R 2nd toe pared utilizing sterile scalpel blade. Light bleeding L 3rd digit addressed with Lumicain. Betadine Ointment applied. No further treatment required. Total number debrided=3.  Contine daily use of Aquaphor Ointment to digits/feet. She may also try Cetaphil  Healing Ointment should the Aquaphor Ointment not work. She related understanding. -Patient to report any pedal injuries to medical professional immediately. -Patient/POA to call should there be question/concern in the interim.  Return in about 3 months (around 01/12/2021).  Marzetta Board, DPM

## 2020-10-20 ENCOUNTER — Ambulatory Visit: Payer: 59 | Attending: Internal Medicine

## 2020-10-20 ENCOUNTER — Other Ambulatory Visit (HOSPITAL_BASED_OUTPATIENT_CLINIC_OR_DEPARTMENT_OTHER): Payer: Self-pay

## 2020-10-20 DIAGNOSIS — M461 Sacroiliitis, not elsewhere classified: Secondary | ICD-10-CM | POA: Diagnosis not present

## 2020-10-20 DIAGNOSIS — M7918 Myalgia, other site: Secondary | ICD-10-CM | POA: Diagnosis not present

## 2020-10-20 DIAGNOSIS — Z23 Encounter for immunization: Secondary | ICD-10-CM

## 2020-10-20 DIAGNOSIS — M797 Fibromyalgia: Secondary | ICD-10-CM | POA: Diagnosis not present

## 2020-10-20 DIAGNOSIS — M5416 Radiculopathy, lumbar region: Secondary | ICD-10-CM | POA: Diagnosis not present

## 2020-10-20 MED ORDER — CYCLOBENZAPRINE HCL 10 MG PO TABS
ORAL_TABLET | ORAL | 2 refills | Status: DC
  Filled 2020-10-20: qty 30, 30d supply, fill #0

## 2020-10-20 MED ORDER — DULOXETINE HCL 30 MG PO CPEP
ORAL_CAPSULE | ORAL | 2 refills | Status: DC
  Filled 2020-10-20: qty 120, 30d supply, fill #0

## 2020-10-20 MED ORDER — METHOCARBAMOL 500 MG PO TABS
ORAL_TABLET | ORAL | 2 refills | Status: DC
  Filled 2020-10-20: qty 180, 30d supply, fill #0
  Filled 2021-01-30: qty 180, 30d supply, fill #1
  Filled 2021-03-19: qty 180, 30d supply, fill #2

## 2020-10-20 NOTE — Progress Notes (Signed)
   Covid-19 Vaccination Clinic  Name:  [name]    MRN: [id number] DOB: 12/15/1955  10/20/2020  [name] was observed post Covid-19 immunization for 15 minutes without incident. She was provided with Vaccine Information Sheet and instruction to access the V-Safe system.   [name] was instructed to call 911 with any severe reactions post vaccine: Marland Kitchen Difficulty breathing  . Swelling of face and throat  . A fast heartbeat  . A bad rash all over body  . Dizziness and weakness   Immunizations Administered    Name Date Dose VIS Date Route   PFIZER Comrnaty(Gray TOP) Covid-19 Vaccine 10/20/2020  2:43 PM 0.3 mL 06/19/2020 Intramuscular   Manufacturer: [place]   Lot: [id number]   NDC: [phone]

## 2020-10-24 ENCOUNTER — Other Ambulatory Visit (HOSPITAL_BASED_OUTPATIENT_CLINIC_OR_DEPARTMENT_OTHER): Payer: Self-pay

## 2020-10-24 MED ORDER — PFIZER-BIONT COVID-19 VAC-TRIS 30 MCG/0.3ML IM SUSP
INTRAMUSCULAR | 0 refills | Status: DC
  Filled 2020-10-24: qty 0.3, 1d supply, fill #0

## 2020-10-27 ENCOUNTER — Other Ambulatory Visit (HOSPITAL_BASED_OUTPATIENT_CLINIC_OR_DEPARTMENT_OTHER): Payer: Self-pay

## 2020-10-27 MED FILL — Hydralazine HCl Tab 25 MG: ORAL | 30 days supply | Qty: 30 | Fill #0 | Status: AC

## 2020-10-31 ENCOUNTER — Encounter: Payer: Self-pay | Admitting: Family Medicine

## 2020-11-03 ENCOUNTER — Encounter: Payer: Self-pay | Admitting: Family Medicine

## 2020-11-05 ENCOUNTER — Encounter: Payer: Self-pay | Admitting: Family Medicine

## 2020-11-05 DIAGNOSIS — M461 Sacroiliitis, not elsewhere classified: Secondary | ICD-10-CM | POA: Diagnosis not present

## 2020-11-06 DIAGNOSIS — M0609 Rheumatoid arthritis without rheumatoid factor, multiple sites: Secondary | ICD-10-CM | POA: Diagnosis not present

## 2020-11-11 MED FILL — Galcanezumab-gnlm Subcutaneous Soln Auto-Injector 120 MG/ML: SUBCUTANEOUS | 28 days supply | Qty: 1 | Fill #0 | Status: AC

## 2020-11-12 ENCOUNTER — Other Ambulatory Visit (HOSPITAL_BASED_OUTPATIENT_CLINIC_OR_DEPARTMENT_OTHER): Payer: Self-pay

## 2020-11-18 ENCOUNTER — Other Ambulatory Visit (HOSPITAL_BASED_OUTPATIENT_CLINIC_OR_DEPARTMENT_OTHER): Payer: Self-pay

## 2020-11-18 MED FILL — Cyclobenzaprine HCl Tab 10 MG: ORAL | 30 days supply | Qty: 30 | Fill #0 | Status: AC

## 2020-11-18 MED FILL — Hydroxychloroquine Sulfate Tab 200 MG: ORAL | 30 days supply | Qty: 60 | Fill #0 | Status: AC

## 2020-11-18 MED FILL — Duloxetine HCl Enteric Coated Pellets Cap 30 MG (Base Eq): ORAL | 30 days supply | Qty: 120 | Fill #0 | Status: AC

## 2020-11-20 ENCOUNTER — Telehealth: Payer: Self-pay | Admitting: Cardiology

## 2020-11-20 ENCOUNTER — Other Ambulatory Visit: Payer: Self-pay | Admitting: Cardiology

## 2020-11-20 ENCOUNTER — Other Ambulatory Visit (HOSPITAL_BASED_OUTPATIENT_CLINIC_OR_DEPARTMENT_OTHER): Payer: Self-pay

## 2020-11-20 MED ORDER — OLMESARTAN MEDOXOMIL 20 MG PO TABS
20.0000 mg | ORAL_TABLET | Freq: Every day | ORAL | 3 refills | Status: DC
  Filled 2020-11-20: qty 90, 90d supply, fill #0
  Filled 2021-02-19: qty 90, 90d supply, fill #1
  Filled 2021-05-21: qty 90, 90d supply, fill #2
  Filled 2021-09-22: qty 90, 90d supply, fill #3

## 2020-11-20 NOTE — Telephone Encounter (Signed)
Left message for [name] from pharmacy to call back.

## 2020-11-20 NOTE — Telephone Encounter (Signed)
Pt c/o medication issue:  1. Name of Medication:olmesartan (BENICAR) 20 MG tablet  2. How are you currently taking this medication (dosage and times per day)? Not currently taking  3. Are you having a reaction (difficulty breathing--STAT)?NO   4. What is your medication issue? [name] is calling in with concerns about this mediation.He states that when he tries to refill the medication it gives a error stating it needs a new script the old one has expired.

## 2020-11-20 NOTE — Telephone Encounter (Signed)
Called and spoke to patient to inform her that her forms would be faxed over to the [facility] location and to pay the $29 fee and sign authorization at that location to process her forms.  AO 11/20/20

## 2020-11-20 NOTE — Addendum Note (Signed)
Addended by: Beatrix Fetters on: 11/20/2020 04:36 PM   Modules accepted: Orders

## 2020-11-21 ENCOUNTER — Other Ambulatory Visit (HOSPITAL_BASED_OUTPATIENT_CLINIC_OR_DEPARTMENT_OTHER): Payer: Self-pay

## 2020-11-21 ENCOUNTER — Other Ambulatory Visit: Payer: Self-pay | Admitting: Family Medicine

## 2020-11-21 ENCOUNTER — Encounter: Payer: Self-pay | Admitting: Family Medicine

## 2020-11-21 DIAGNOSIS — K219 Gastro-esophageal reflux disease without esophagitis: Secondary | ICD-10-CM

## 2020-11-21 MED ORDER — PANTOPRAZOLE SODIUM 40 MG PO TBEC
40.0000 mg | DELAYED_RELEASE_TABLET | Freq: Every day | ORAL | 3 refills | Status: DC
  Filled 2020-11-21: qty 90, 90d supply, fill #0

## 2020-11-21 NOTE — Telephone Encounter (Signed)
Sent in

## 2020-11-21 NOTE — Telephone Encounter (Signed)
Pt requesting Protonix due to increased reflux. Famotidine is not helping. Pt states having Protonix before. Please advise

## 2020-11-26 DIAGNOSIS — M5417 Radiculopathy, lumbosacral region: Secondary | ICD-10-CM | POA: Diagnosis not present

## 2020-12-04 ENCOUNTER — Other Ambulatory Visit (HOSPITAL_BASED_OUTPATIENT_CLINIC_OR_DEPARTMENT_OTHER): Payer: Self-pay

## 2020-12-04 DIAGNOSIS — M0609 Rheumatoid arthritis without rheumatoid factor, multiple sites: Secondary | ICD-10-CM | POA: Diagnosis not present

## 2020-12-04 MED FILL — Mupirocin Oint 2%: CUTANEOUS | 30 days supply | Qty: 22 | Fill #0 | Status: AC

## 2020-12-04 MED FILL — Galcanezumab-gnlm Subcutaneous Soln Auto-Injector 120 MG/ML: SUBCUTANEOUS | 28 days supply | Qty: 1 | Fill #1 | Status: AC

## 2020-12-09 ENCOUNTER — Other Ambulatory Visit (HOSPITAL_BASED_OUTPATIENT_CLINIC_OR_DEPARTMENT_OTHER): Payer: Self-pay

## 2020-12-09 ENCOUNTER — Telehealth: Payer: Self-pay | Admitting: Family Medicine

## 2020-12-09 ENCOUNTER — Other Ambulatory Visit: Payer: Self-pay | Admitting: Family Medicine

## 2020-12-09 DIAGNOSIS — G4700 Insomnia, unspecified: Secondary | ICD-10-CM

## 2020-12-09 MED FILL — Nystatin Topical Powder 100000 Unit/GM: CUTANEOUS | 10 days supply | Qty: 15 | Fill #0 | Status: AC

## 2020-12-09 MED FILL — Temazepam Cap 30 MG: ORAL | 90 days supply | Qty: 90 | Fill #0 | Status: AC

## 2020-12-09 MED FILL — Estrogens, Conjugated Vaginal Cream 0.625 MG/GM: VAGINAL | 60 days supply | Qty: 30 | Fill #0 | Status: AC

## 2020-12-09 NOTE — Telephone Encounter (Signed)
Pt dropped off document to be filled out by provider ASAP (FMLA paperwork in a small white envelope) Pt states is needing document filled out by tomorrow (pt was informed that it takes 5-7 business days- pt mentioned got paperwork last min and that company is requesting it by 06-01) Pt stated send a mychart message concerning about paperwork (but no message was found) Please advise ASAP. Document put at front office tray under providers name. Please fax document when ready. Fax number highlighted on document in envelope.

## 2020-12-09 NOTE — Telephone Encounter (Signed)
I can not fill this out without talking to her --- unless its the same info from old one And I am not in the office tomorrow

## 2020-12-09 NOTE — Telephone Encounter (Signed)
Received. Placed in folder 

## 2020-12-09 NOTE — Telephone Encounter (Signed)
Requesting: temazepam 30mg  Contract: None UDS: None Last Visit: 10/07/2020 Next Visit: 03/27/2021 Last Refill: 06/09/2020 #90 and 2RF  Please Advise

## 2020-12-10 ENCOUNTER — Other Ambulatory Visit (HOSPITAL_BASED_OUTPATIENT_CLINIC_OR_DEPARTMENT_OTHER): Payer: Self-pay

## 2020-12-10 NOTE — Telephone Encounter (Signed)
Forms filled out from prior forms. Forms need sig and arrow sticky have been placed to be filled out.

## 2020-12-11 NOTE — Telephone Encounter (Signed)
done

## 2020-12-12 ENCOUNTER — Encounter: Payer: Self-pay | Admitting: Family Medicine

## 2020-12-15 DIAGNOSIS — M79676 Pain in unspecified toe(s): Secondary | ICD-10-CM

## 2020-12-16 NOTE — Telephone Encounter (Signed)
OV notes sent.

## 2020-12-17 ENCOUNTER — Other Ambulatory Visit (HOSPITAL_BASED_OUTPATIENT_CLINIC_OR_DEPARTMENT_OTHER): Payer: Self-pay

## 2020-12-17 DIAGNOSIS — M2550 Pain in unspecified joint: Secondary | ICD-10-CM | POA: Diagnosis not present

## 2020-12-17 DIAGNOSIS — G894 Chronic pain syndrome: Secondary | ICD-10-CM | POA: Diagnosis not present

## 2020-12-17 DIAGNOSIS — M461 Sacroiliitis, not elsewhere classified: Secondary | ICD-10-CM | POA: Diagnosis not present

## 2020-12-17 DIAGNOSIS — M797 Fibromyalgia: Secondary | ICD-10-CM | POA: Diagnosis not present

## 2020-12-17 DIAGNOSIS — M5417 Radiculopathy, lumbosacral region: Secondary | ICD-10-CM | POA: Diagnosis not present

## 2020-12-17 DIAGNOSIS — M7918 Myalgia, other site: Secondary | ICD-10-CM | POA: Diagnosis not present

## 2020-12-17 MED ORDER — DULOXETINE HCL 30 MG PO CPEP
ORAL_CAPSULE | ORAL | 2 refills | Status: DC
  Filled 2020-12-17: qty 120, 30d supply, fill #0

## 2020-12-17 MED ORDER — NALTREXONE HCL 50 MG PO TABS: ORAL_TABLET | ORAL | 2 refills | Status: DC

## 2020-12-17 MED ORDER — CYCLOBENZAPRINE HCL 10 MG PO TABS
ORAL_TABLET | ORAL | 2 refills | Status: DC
  Filled 2020-12-17: qty 30, 30d supply, fill #0

## 2020-12-17 MED ORDER — METHOCARBAMOL 500 MG PO TABS
ORAL_TABLET | ORAL | 2 refills | Status: DC
  Filled 2020-12-17: qty 180, 30d supply, fill #0

## 2020-12-24 ENCOUNTER — Telehealth: Payer: Self-pay | Admitting: Neurology

## 2020-12-24 ENCOUNTER — Other Ambulatory Visit (HOSPITAL_BASED_OUTPATIENT_CLINIC_OR_DEPARTMENT_OTHER): Payer: Self-pay

## 2020-12-24 DIAGNOSIS — G43019 Migraine without aura, intractable, without status migrainosus: Secondary | ICD-10-CM

## 2020-12-24 MED ORDER — UBRELVY 100 MG PO TABS
100.0000 mg | ORAL_TABLET | ORAL | 2 refills | Status: DC | PRN
  Filled 2020-12-24 (×2): qty 16, 8d supply, fill #0

## 2020-12-24 NOTE — Telephone Encounter (Signed)
Script sent to the pharmacy. 

## 2020-12-24 NOTE — Telephone Encounter (Signed)
Patient is out of Ubrelvy. She was given samples and is out. She wants to know what she needs to do. Does she get an RX for it or stop.

## 2020-12-28 ENCOUNTER — Encounter: Payer: Self-pay | Admitting: Family Medicine

## 2020-12-29 ENCOUNTER — Other Ambulatory Visit (HOSPITAL_BASED_OUTPATIENT_CLINIC_OR_DEPARTMENT_OTHER): Payer: Self-pay

## 2020-12-29 MED ORDER — CEPHALEXIN 500 MG PO CAPS: 500.0000 mg | ORAL_CAPSULE | Freq: Two times a day (BID) | ORAL | 0 refills | Status: DC

## 2020-12-30 ENCOUNTER — Encounter (HOSPITAL_COMMUNITY): Payer: Self-pay | Admitting: *Deleted

## 2020-12-30 ENCOUNTER — Other Ambulatory Visit (HOSPITAL_BASED_OUTPATIENT_CLINIC_OR_DEPARTMENT_OTHER): Payer: Self-pay

## 2020-12-30 DIAGNOSIS — M549 Dorsalgia, unspecified: Secondary | ICD-10-CM | POA: Diagnosis not present

## 2020-12-30 DIAGNOSIS — M1990 Unspecified osteoarthritis, unspecified site: Secondary | ICD-10-CM | POA: Diagnosis not present

## 2020-12-30 DIAGNOSIS — M2550 Pain in unspecified joint: Secondary | ICD-10-CM | POA: Diagnosis not present

## 2020-12-30 DIAGNOSIS — M79643 Pain in unspecified hand: Secondary | ICD-10-CM | POA: Diagnosis not present

## 2020-12-30 DIAGNOSIS — M0609 Rheumatoid arthritis without rheumatoid factor, multiple sites: Secondary | ICD-10-CM | POA: Diagnosis not present

## 2020-12-30 DIAGNOSIS — Z79899 Other long term (current) drug therapy: Secondary | ICD-10-CM | POA: Diagnosis not present

## 2020-12-30 DIAGNOSIS — M797 Fibromyalgia: Secondary | ICD-10-CM | POA: Diagnosis not present

## 2020-12-30 DIAGNOSIS — G8929 Other chronic pain: Secondary | ICD-10-CM | POA: Diagnosis not present

## 2020-12-30 DIAGNOSIS — M791 Myalgia, unspecified site: Secondary | ICD-10-CM | POA: Diagnosis not present

## 2020-12-30 MED ORDER — AMOXICILLIN 500 MG PO CAPS
ORAL_CAPSULE | ORAL | 0 refills | Status: DC
  Filled 2020-12-30: qty 15, 5d supply, fill #0

## 2020-12-31 ENCOUNTER — Other Ambulatory Visit (HOSPITAL_BASED_OUTPATIENT_CLINIC_OR_DEPARTMENT_OTHER): Payer: Self-pay

## 2020-12-31 MED FILL — Rosuvastatin Calcium Tab 20 MG: ORAL | 90 days supply | Qty: 90 | Fill #0 | Status: AC

## 2020-12-31 MED FILL — Galcanezumab-gnlm Subcutaneous Soln Auto-Injector 120 MG/ML: SUBCUTANEOUS | 28 days supply | Qty: 1 | Fill #2 | Status: AC

## 2021-01-01 DIAGNOSIS — M5432 Sciatica, left side: Secondary | ICD-10-CM | POA: Diagnosis not present

## 2021-01-01 DIAGNOSIS — M5431 Sciatica, right side: Secondary | ICD-10-CM | POA: Diagnosis not present

## 2021-01-01 DIAGNOSIS — M48062 Spinal stenosis, lumbar region with neurogenic claudication: Secondary | ICD-10-CM | POA: Diagnosis not present

## 2021-01-01 DIAGNOSIS — M4316 Spondylolisthesis, lumbar region: Secondary | ICD-10-CM | POA: Diagnosis not present

## 2021-01-07 DIAGNOSIS — M545 Low back pain, unspecified: Secondary | ICD-10-CM | POA: Diagnosis not present

## 2021-01-07 DIAGNOSIS — M47812 Spondylosis without myelopathy or radiculopathy, cervical region: Secondary | ICD-10-CM | POA: Diagnosis not present

## 2021-01-07 DIAGNOSIS — M5416 Radiculopathy, lumbar region: Secondary | ICD-10-CM | POA: Diagnosis not present

## 2021-01-08 MED FILL — Estradiol TD Patch Twice Weekly 0.1 MG/24HR: TRANSDERMAL | 90 days supply | Qty: 24 | Fill #0 | Status: AC

## 2021-01-09 ENCOUNTER — Other Ambulatory Visit (HOSPITAL_BASED_OUTPATIENT_CLINIC_OR_DEPARTMENT_OTHER): Payer: Self-pay

## 2021-01-09 ENCOUNTER — Other Ambulatory Visit: Payer: Self-pay

## 2021-01-09 ENCOUNTER — Ambulatory Visit: Payer: 59 | Admitting: Cardiology

## 2021-01-09 ENCOUNTER — Encounter: Payer: Self-pay | Admitting: Cardiology

## 2021-01-09 VITALS — BP 138/78 | HR 86 | Resp 18 | Ht 71.0 in | Wt 219.4 lb

## 2021-01-09 DIAGNOSIS — E785 Hyperlipidemia, unspecified: Secondary | ICD-10-CM

## 2021-01-09 DIAGNOSIS — I951 Orthostatic hypotension: Secondary | ICD-10-CM | POA: Diagnosis not present

## 2021-01-09 DIAGNOSIS — I771 Stricture of artery: Secondary | ICD-10-CM | POA: Diagnosis not present

## 2021-01-09 DIAGNOSIS — I350 Nonrheumatic aortic (valve) stenosis: Secondary | ICD-10-CM | POA: Diagnosis not present

## 2021-01-09 DIAGNOSIS — R0989 Other specified symptoms and signs involving the circulatory and respiratory systems: Secondary | ICD-10-CM

## 2021-01-09 DIAGNOSIS — E1169 Type 2 diabetes mellitus with other specified complication: Secondary | ICD-10-CM | POA: Diagnosis not present

## 2021-01-09 DIAGNOSIS — R55 Syncope and collapse: Secondary | ICD-10-CM | POA: Diagnosis not present

## 2021-01-09 NOTE — Progress Notes (Deleted)
Primary Care Provider: Carollee Herter, Alferd Apa, DO Cardiologist: Glenetta Hew, MD Electrophysiologist: None  Clinic Note: No chief complaint on file.   ===================================  ASSESSMENT/PLAN   Problem List Items Addressed This Visit   None   ===================================  HPI:    MILDRETH REEK is a 65 y.o. female with a PMH below who presents today for ***. JESSCIA IMM is a 65 y.o. female who is being seen today for the evaluation of *** at the request of [name], *.  SARAFINA PUTHOFF was last seen on ***  Recent Hospitalizations: ***  Reviewed  CV studies:    The following studies were reviewed today: (if available, images/films reviewed: From Epic Chart or Care Everywhere) ***:   Interval History:   LUVERN MISCHKE   CV Review of Symptoms (Summary) Cardiovascular ROS: {roscv:[id number]}  REVIEWED OF SYSTEMS   ROS  I have reviewed and (if needed) personally updated the patient's problem list, medications, allergies, past medical and surgical history, social and family history.   PAST MEDICAL HISTORY   Past Medical History:  Diagnosis Date   Allergic rhinitis    Anxiety    Asthma    hx bronchial asthma at times with upper resp infection   Depression    Fibromyalgia    GERD (gastroesophageal reflux disease)    Headache(784.0)    migraine and cluster headaches   Hyperlipemia    Hypertension    IBS (irritable bowel syndrome)    Menopause    Neuromuscular disorder ([place]) 2009   hx of fibromyalgia, polyarthralsia (surgery induced)   Osteoarthritis    Rheumatoid arthritis ([place])    Complicated by osteoarthritis as well.   Rheumatoid arthritis ([place])    Stress incontinence    at times   Tibial fracture 10/14/2016   evulsion periostial right   Type 2 diabetes mellitus with complication, without long-term current use of insulin (HCC)    diet controlled no meds -> however was diagnosed with a diabetic foot ulcer.     PAST SURGICAL HISTORY   Past Surgical History:  Procedure Laterality Date   ABDOMINAL HYSTERECTOMY  12/1999   complete   CARPAL TUNNEL RELEASE Right 12/27/2017   Procedure: CARPAL TUNNEL RELEASE;  Surgeon: Roseanne Kaufman, MD;  Location: [facility];  Service: Orthopedics;  Laterality: Right;   CERVICAL CONIZATION W/BX  june 1990   dysplasia of cervix/used 5Fu cream for 3 months   CERVICAL LAMINECTOMY  2005 & 2009   X 2   NO ROM [place]   X 2   CHOLECYSTECTOMY  1986   DILATION AND CURETTAGE OF UTERUS   [phone]   missed abortion   ESOPHAGOGASTRODUODENOSCOPY  06/29/2011   Procedure: ESOPHAGOGASTRODUODENOSCOPY (EGD);  Surgeon: Shann Medal, MD;  Location: [facility];  Service: General;  Laterality: N/A;   FOOT SURGERY  2005   RT HEEL   GASTRIC BANDING PORT REVISION  09/24/2011   Procedure: GASTRIC BANDING PORT REVISION;  Surgeon: Pedro Earls, MD;  Location: [facility];  Service: General;  Laterality: N/A;   GASTRIC ROUX-EN-Y N/A 06/06/2017   Procedure: Conversion from [place] to [place];  Surgeon: Johnathan Hausen, MD;  Location: [facility];  Service: General;  Laterality: N/A;   [place]  12/30/09   LAPAROSCOPIC GASTRIC SLEEVE RESECTION N/A 07/02/2013   Procedure: LAPAROSCOPIC GASTRIC SLEEVE RESECTION upper endoscopy;  Surgeon:  Pedro Earls, MD;  Location: [facility];  Service: General;  Laterality: N/A;   LAPAROSCOPIC REPAIR AND REMOVAL OF GASTRIC BAND N/A 07/02/2013   Procedure: LAPAROSCOPIC REMOVAL OF GASTRIC BAND;  Surgeon: Pedro Earls, MD;  Location: [facility];  Service: General;  Laterality: N/A;   LAPAROSCOPIC REVISION OF GASTRIC BAND  07/03/2012   Procedure: LAPAROSCOPIC REVISION OF GASTRIC BAND;  Surgeon: Pedro Earls, MD;  Location: [facility];  Service: General;  Laterality: N/A;  removal of old lap. band port, replaced with AP standard band    LAPAROSCOPY  09/24/2011   Procedure: LAPAROSCOPY DIAGNOSTIC;  Surgeon: Pedro Earls, MD;  Location: [facility];  Service: General;  Laterality: N/A;   LAPAROSCOPY  07/03/2012   Procedure: LAPAROSCOPY DIAGNOSTIC;  Surgeon: Pedro Earls, MD;  Location: [facility];  Service: General;  Laterality: N/A;  Exploratory Laparoscopy    MESH APPLIED TO LAP PORT  07/03/2012   Procedure: MESH APPLIED TO LAP PORT;  Surgeon: Pedro Earls, MD;  Location: [facility];  Service: General;  Laterality: N/A;   [place]   X 2   right knee  1981   ARTHROSCOPY AND ARTHROTOMY   right knee arthroscopy and arthrotomy  12-1979   [place]   WITH C -SECTION    Immunization History  Administered Date(s) Administered   H1N1 07/23/2008   Influenza Whole 04/24/2008   Influenza,inj,Quad PF,6+ Mos 04/11/2013, 04/04/2019   Influenza-Unspecified 04/25/2014, 03/13/2015, 03/12/2016, 03/25/2020   PFIZER Comirnaty(Gray Top)Covid-19 Tri-Sucrose Vaccine 10/20/2020   PFIZER(Purple Top)SARS-COV-2 Vaccination 07/11/2019, 07/31/2019, 03/08/2020   Pneumococcal Polysaccharide-23 07/04/2013   Td 01/02/2009   Tdap 10/04/2018   Zoster Recombinat (Shingrix) 03/21/2017, 05/25/2017    MEDICATIONS/ALLERGIES   Current Meds  Medication Sig   acetaminophen (TYLENOL) 650 MG CR tablet 2 tablets as needed   Ca Phosphate-Cholecalciferol (CALTRATE GUMMY BITES PO) Take 2 each 2 (two) times daily by mouth.    conjugated estrogens (PREMARIN) vaginal cream INSERT 1 GRAM OF CREAM PER VAGINA THREE TIMES A WEEK BY VAGINAL ROUTE.   COVID-19 mRNA Vac-TriS, Pfizer, (PFIZER-BIONT COVID-19 VAC-TRIS) SUSP injection Inject into the muscle.   cyclobenzaprine (FLEXERIL) 10 MG tablet TAKE ONE TABLET (10 MG DOSE) BY MOUTH AT BEDTIME AS NEEDED.   diclofenac Sodium (VOLTAREN) 1 % GEL See admin instructions.   DULoxetine (CYMBALTA) 30 MG capsule 30 mg 2 (two) times daily. 2 TABS BID   estradiol  (VIVELLE-DOT) 0.1 MG/24HR Place 1 patch (0.1 mg total) onto the skin 2 (two) times a week. Monday and Thursday   furosemide (LASIX) 20 MG tablet TAKE 1 TABLET BY MOUTH EVERY MORNING   gabapentin (NEURONTIN) 300 MG capsule TAKE 1 CAPSULE BY MOUTH THREE TIMES DAILY   Galcanezumab-gnlm 120 MG/ML SOAJ INJECT 120 MG INTO THE SKIN EVERY 28 (TWENTY-EIGHT) DAYS.   glucose blood test strip Test blood sugar once daily. Dx Code: E11.9   hydrALAZINE (APRESOLINE) 25 MG tablet TAKE 1 TABLET BY MOUTH UP TO TWICE DAILY IF SYSTOLIC BLOOD PRESSURE IS EQUAL OR GREATER THAN 160 (Patient taking differently: 25 mg. PT IS TAKING 1-2 TABS DAILY AS NEEDED)   hydroxychloroquine (PLAQUENIL) 200 MG tablet 200 mg 2 (two) times daily.    loratadine (CLARITIN) 10 MG tablet Take 10 mg by mouth every evening.   meclizine (ANTIVERT) 25 MG tablet Take 1 tablet (25 mg total) by mouth 3 (three) times daily as needed for dizziness.   methocarbamol (  ROBAXIN) 500 MG tablet Take one tablet (500 mg dose) by mouth every 4 (four) hours as needed.   mupirocin ointment (BACTROBAN) 2 % APPLY 1 APPLICATION ON TO THE SKIN TWO TIMES DAILY   naltrexone (DEPADE) 50 MG tablet low-dose naltrexone 1.5mg  tablets, 3 tablets po qhs   NALTREXONE HCL PO 4.5 mg pt reported.   NONFORMULARY OR COMPOUNDED ITEM bp cuff  Dx hypertension   nystatin (MYCOSTATIN/NYSTOP) powder APPLY 1 APPLICATION TOPICALLY 3 (THREE) TIMES DAILY.   olmesartan (BENICAR) 20 MG tablet Take 1 tablet (20 mg total) by mouth daily with supper.   pantoprazole (PROTONIX) 40 MG tablet Take 1 tablet (40 mg total) by mouth daily.   Pediatric Multivit-Minerals-C (ONE-A-DAY SCOOBY-DOO GUMMIES PO) Take 1 tablet 2 (two) times daily by mouth.    potassium chloride (KLOR-CON) 10 MEQ tablet TAKE 1 TABLET (10 MEQ TOTAL) BY MOUTH DAILY.   rosuvastatin (CRESTOR) 20 MG tablet TAKE 1 TABLET BY MOUTH ONCE DAILY   temazepam (RESTORIL) 30 MG capsule TAKE 1 CAPSULE (30 MG TOTAL) BY MOUTH AT BEDTIME AS NEEDED  FOR SLEEP.   thiamine (VITAMIN B-1) 100 MG tablet Take 100 mg daily by mouth.   Ubrogepant (UBRELVY) 100 MG TABS Take 100 mg by mouth as needed (as needed for migraine. MAX 2 tabs in 24 hours).   UNABLE TO FIND Med Name: Actemra infusion 4 MF one dose monthly   [DISCONTINUED] cyclobenzaprine (FLEXERIL) 10 MG tablet TAKE 1 TABLET BY MOUTH NIGHLTY   [DISCONTINUED] cyclobenzaprine (FLEXERIL) 10 MG tablet TAKE 1 TABLET BY MOUTH EVERY NIGHT AT BEDTIME AS NEEDED   [DISCONTINUED] cyclobenzaprine (FLEXERIL) 10 MG tablet TAKE ONE TABLET (10 MG DOSE) BY MOUTH 3 (THREE) TIMES A DAY AS NEEDED FOR MUSCLE SPASMS FOR UP TO 10 DAYS.   [DISCONTINUED] cyclobenzaprine (FLEXERIL) 10 MG tablet Take one tablet (10 mg dose) by mouth at bedtime as needed.   [DISCONTINUED] cyclobenzaprine (FLEXERIL) 10 MG tablet Take one tablet (10 mg dose) by mouth at bedtime as needed.   [DISCONTINUED] DULoxetine (CYMBALTA) 30 MG capsule TAKE TWO CAPSULES (60 MG DOSE) BY MOUTH 2 (TWO) TIMES DAILY.   [DISCONTINUED] DULoxetine (CYMBALTA) 30 MG capsule Take two capsules (60 mg dose) by mouth 2 (two) times daily.   [DISCONTINUED] DULoxetine (CYMBALTA) 30 MG capsule Take two capsules (60 mg dose) by mouth 2 (two) times daily.   [DISCONTINUED] estradiol (VIVELLE-DOT) 0.1 MG/24HR patch APPLY 1 PATCH TWICE A WEEK ON CLEAN DRY SKIN   [DISCONTINUED] hydroxychloroquine (PLAQUENIL) 200 MG tablet TAKE 2 TABLETS BY MOUTH DAILY   [DISCONTINUED] leflunomide (ARAVA) 10 MG tablet TAKE 1 TABLET BY MOUTH ONCE DAILY   [DISCONTINUED] methocarbamol (ROBAXIN) 500 MG tablet Take one tablet (500 mg dose) by mouth every 4 (four) hours as needed.   [DISCONTINUED] PREMARIN vaginal cream Apply 1 application topically 2 (two) times a week. Mondays & Thursdays.   [DISCONTINUED] Ubrogepant (UBRELVY) 100 MG TABS     Allergies  Allergen Reactions   Acetaminophen     Other reaction(s): itching, whelps   Isoniazid     Severe SOB   Nitrofurantoin Shortness Of Breath     REACTION: welts   Hctz [Hydrochlorothiazide]     rash   Oseltamivir Phosphate Nausea And Vomiting    "I vomited within 30 minutes of taking it and was told I cannot ever take it again."   Promethazine Hcl [Promethazine Hcl]     IF GIVEN  IV-hallucinations     CAN TAKE PO PHENERGAN   Sulfa  Antibiotics Rash   Tamiflu [Oseltamivir Phosphate] Nausea And Vomiting    "I vomited within 30 minutes of taking it and was told I cannot ever take it again."   Gabapentin     Pt states she has eye swelling    Macrolides And Ketolides     rash   Morphine Other (See Comments)    severe vomiting   Other Other (See Comments)    bandaids   Percocet [Oxycodone-Acetaminophen]     REACTION: severe itching. Can take by mouth codeine   Roxicet [Oxycodone-Acetaminophen]     itching   Sulfamethoxazole-Trimethoprim     Septra / Bactrim  --REACTION: rash   Tamiflu [Oseltamivir]     Other reaction(s): Unknown   Fentanyl Hives and Rash    REACTION: welts    SOCIAL HISTORY/FAMILY HISTORY   Reviewed in Epic:  Pertinent findings:  Social History   Tobacco Use   Smoking status: Never   Smokeless tobacco: Never  Vaping Use   Vaping Use: Never used  Substance Use Topics   Alcohol use: No    Alcohol/week: 0.0 standard drinks   Drug use: No   Social History   Social History Narrative   Right handed   Two story home   Drinks caffeine occasionally    OBJCTIVE -PE, EKG, labs   Wt Readings from Last 3 Encounters:  01/09/21 219 lb 6.4 oz (99.5 kg)  09/11/20 216 lb 3.2 oz (98.1 kg)  09/10/20 217 lb 6.4 oz (98.6 kg)    Physical Exam: BP 138/78 (BP Location: Left Arm, Patient Position: Sitting, Cuff Size: Normal)   Pulse 86   Resp 18   Ht 5\' 11"  (1.803 m)   Wt 219 lb 6.4 oz (99.5 kg)   SpO2 98%   BMI 30.60 kg/m  Physical Exam   Adult ECG Report  Rate: *** ;  Rhythm: {rhythm:17366};   Narrative Interpretation: ***  Recent Labs:  ***  Lab Results  Component Value Date   CHOL 152  07/21/2020   HDL 82.80 07/21/2020   LDLCALC 54 07/21/2020   TRIG 77.0 07/21/2020   CHOLHDL 2 07/21/2020   Lab Results  Component Value Date   CREATININE 0.8 09/08/2020   BUN 16 09/08/2020   NA 136 (A) 09/08/2020   K 4.3 09/08/2020   CL 100 09/08/2020   CO2 21 09/08/2020   CBC Latest Ref Rng & Units 09/11/2020 09/08/2020 08/01/2020  WBC 4.0 - 10.5 K/uL 5.3 8.0 6.8  Hemoglobin 12.0 - 15.0 g/dL 11.3(L) 10.5(A) 12.1  Hematocrit 36.0 - 46.0 % 35.1(L) 33(A) 37.6  Platelets 150.0 - 400.0 K/uL 312.0 279 174    Lab Results  Component Value Date   TSH 1.85 09/08/2020    ==================================================  COVID-19 Education: The signs and symptoms of COVID-19 were discussed with the patient and how to seek care for testing (follow up with PCP or arrange E-visit).    I spent a total of ***minutes with the patient spent in direct patient consultation.  Additional time spent with chart review  / charting (studies, outside notes, etc): *** min Total Time: *** min  Current medicines are reviewed at length with the patient today.  (+/- concerns) ***  This visit occurred during the SARS-CoV-2 public health emergency.  Safety protocols were in place, including screening questions prior to the visit, additional usage of staff PPE, and extensive cleaning of exam room while observing appropriate contact time as indicated for disinfecting solutions.  Notice: This dictation was prepared  with Dragon dictation along with smaller phrase technology. Any transcriptional errors that result from this process are unintentional and may not be corrected upon review.  Patient Instructions / Medication Changes & Studies & Tests Ordered   There are no Patient Instructions on file for this visit.   Studies Ordered:   No orders of the defined types were placed in this encounter.    Glenetta Hew, M.D., M.S. Interventional Cardiologist   Pager # [phone] Phone # [phone] [address] [place], [place] [address]   Thank you for choosing Heartcare at [facility]!!

## 2021-01-09 NOTE — Patient Instructions (Addendum)
Medication Instructions:  No changes  *If you need a refill on your cardiac medications before your next appointment, please call your pharmacy*   Lab Work:  Not needed   Testing/Procedures:  Will be schedule at [address]- in Jan 2023 Your physician has requested that you have a carotid duplex. This test is an ultrasound of the carotid arteries in your neck. It looks at blood flow through these arteries that supply the brain with blood. Allow one hour for this exam. There are no restrictions or special instructions.   Follow-Up: At [facility], you and your health needs are our priority.  As part of our continuing mission to provide you with exceptional heart care, we have created designated Provider Care Teams.  These Care Teams include your primary Cardiologist (physician) and Advanced Practice Providers (APPs -  Physician Assistants and Nurse Practitioners) who all work together to provide you with the care you need, when you need it.     Your next appointment:   7 month(s)  The format for your next appointment:   In Person  Provider:   Glenetta Hew, MD

## 2021-01-09 NOTE — Progress Notes (Signed)
Primary Care Provider: Carollee Herter, Alferd Apa, DO Cardiologist: Glenetta Hew, MD Electrophysiologist: None Spine/Neurosurgeon: Dr. 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  Not related to syncope.  Not likely to progress to being significant anytime soon.  Continue to monitor murmur.       Relevant Orders   EKG 12-Lead   Syncope    Thankfully, she has not had any further episodes of syncope.  We may never totally figure out what happened during those episodes, but most likely vasovagal versus orthostatic (neurocardiogenic).  Low suspicion for arrhythmia.  May want to consider tilt table study if she has another episode.  For now continue adequate hydration and avoid excess BP control.  Would not titrate further than current dose of ARB. ->  We will switch the dosing to dinnertime as opposed to evening/bedtime to avoid the mid afternoon drops and pressures.        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  Will order carotid/subclavian artery Dopplers.       Relevant Orders   VAS US CAROTID   Labile hypertension (Chronic)    At this point we have her using hydralazine as needed for systolic pressures over 672-094 bpm.  She says that she has been thinking about 3-4 times in the past month.       Relevant Orders   EKG 12-Lead   Hyperlipidemia associated with type 2 diabetes mellitus ([place]) (Chronic)    Lipid panel as of January looks great on current dose of statin.  No change       Orthostatic hypotension (Chronic)    She still has somewhat  labile hypertension with some orthostasis.  I do suspect that this is probably the reason for her syncope that she had.  Continue to recommend adequate hydration along with permissive hypertension.  We will also check a carotid Doppler just to make sure that Carotid/subclavian artery stenosis is not contributing.  Try to avoid standing dose diuretic.  She is only using her Lasix as needed.  And is started wearing support stockings.  The main difficulty putting on is because of her back pain.        Relevant Orders   EKG 12-Lead   VAS US CAROTID    ===================================  HPI:    [name] is a 65 y.o. female with a PMH notable for fibromyalgia, rheumatoid arthritis, labile HTN, DM-2, depression, IBS and episodic dizziness with history of syncope who presents today for 35-month follow-up.  [name] was seen on September 10, 2020 for follow-up consultation for an episode of syncope.  She had worn a monitor -reviewed below, that did not show any arrhythmias besides 1 short burst of 4 PVCs/NSVT.  She had an episode where she was watching television about 1 in the morning.  She said she got up to go to see what her daughter wanted in the kitchen.  She did not make it into the kitchen, simply fell out.  She is not aware of what happened.  She cannot recall having a sensation of any irregular heartbeats palpitations or any sensation of lightheadedness visit.  She simply just passed out.  When she felt little better she went to stand up to walk back to the bedroom and passed out yet again.  She does say that her blood pressure was low at the time.  There was no evidence of any seizure-like activity with convulsions.  She did not have any postictal confusion just not sure what happened.  Prior to this she had only noted on occasion feeling somewhat dizzy with standing.  Thankfully they have been no further breakthrough spells. -> She is chronically troubled by back pain and  sacroiliitis with chronic arthritis pains.  Also somewhat anxious  Recent Hospitalizations: None  Reviewed  CV studies:    The following studies were reviewed today: (if available, images/films reviewed: From Epic Chart or Care Everywhere)  Event monitor from 08/01/2020-08/30/2020.Relatively normal monitor.No arrhythmias to explain the syncope. Normal sinus rhythm. Maximum. Average 78 bpm. There was 1 4 beat run of PVCs (VT)-not noted on monitor 23 total events:  All of the manually detected events (symptoms of dizziness or lightheadedness) were associated with sinus rhythm, sinus tachycardia with PACs. Relatively normal monitor. Relatively normal monitor. Relatively normal monitor. No true sustained arrhythmias and SVT, A. fib, atrial flutter   Interval History:   [name] presents here today for follow-up stating that she has not had any further passout spells but she does still feel off and on dizzy with standing up quickly.  Usually her blood pressure is about where it is today but it does go up and down some in the afternoons.  On occasion she feels dizzy enough that she may pass out but has not totally passed out.  Again this usually happens in the afternoon when she noticed that her little low pressures tend to be a little lower Perhaps most notable symptom she has is that she is not sleeping very well.  Not sleeping through the night.  She has not had a true syncope nor has she had any PND orthopnea or edema.  No chest pain or pressure.  CV Review of Symptoms (Summary) Cardiovascular ROS: positive for - orthopnea and lightheadedness, dizziness, near syncope but no true syncope. negative for - chest pain, dyspnea on exertion, edema, irregular heartbeat, palpitations, paroxysmal nocturnal dyspnea, rapid heart rate, shortness of breath, or TIA/amaurosis fugax, claudication; subclavian steal symptoms.  She is being evaluated by her spine surgeon for spondylolisthesis is pending an  L-spine MRI.  Her back is really been hurting her and acting up quite a bit.  Limiting her activity.  She is also troubled by sacroiliitis.  She is in the process try to seek out disability.  She also notes that her blood pressures are somewhat different on the right arm versus the left arm.  She indicates that they have measured higher on the left than the right by 20 mm.Hg  REVIEWED OF SYSTEMS   Review of Systems  Constitutional:  Negative for malaise/fatigue (May be a little less energy than before because of her pains.) and weight loss.  HENT:  Negative for nosebleeds.   Respiratory:  Negative for cough and shortness of breath.   Cardiovascular:  Negative for palpitations.       Per HPI  Gastrointestinal:  Positive for nausea. Negative for blood in stool, heartburn and melena.  Genitourinary:  Negative for frequency and hematuria.  Musculoskeletal:  Positive for back pain and joint  pain (Mostly hips and shoulders).  Neurological:  Positive for dizziness (Orthostatic). Negative for weakness (Although his legs sometimes feel weak.).  Psychiatric/Behavioral:  Negative for depression. The patient is nervous/anxious and has insomnia (Not sleeping through the night.).    I have reviewed and (if needed) personally updated the patient's problem list, medications, allergies, past medical and surgical history, social and family history.   PAST MEDICAL HISTORY   Past Medical History:  Diagnosis Date   Allergic rhinitis    Anxiety    Asthma    hx bronchial asthma at times with upper resp infection   Depression    Fibromyalgia    GERD (gastroesophageal reflux disease)    Headache(784.0)    migraine and cluster headaches   Hyperlipemia    Hypertension    IBS (irritable bowel syndrome)    Menopause    Neuromuscular disorder ([place]) 2009   hx of fibromyalgia, polyarthralsia (surgery induced)   Osteoarthritis    Rheumatoid arthritis ([place])    Complicated by osteoarthritis as well.    Rheumatoid arthritis ([place])    Stress incontinence    at times   Tibial fracture 10/14/2016   evulsion periostial right   Type 2 diabetes mellitus with complication, without long-term current use of insulin (HCC)    diet controlled no meds -> however was diagnosed with a diabetic foot ulcer.    PAST SURGICAL HISTORY   Past Surgical History:  Procedure Laterality Date   ABDOMINAL HYSTERECTOMY  12/1999   complete   CARPAL TUNNEL RELEASE Right 12/27/2017   Procedure: CARPAL TUNNEL RELEASE;  Surgeon: Roseanne Kaufman, MD;  Location: [facility];  Service: Orthopedics;  Laterality: Right;   CERVICAL CONIZATION W/BX  june 1990   dysplasia of cervix/used 5Fu cream for 3 months   CERVICAL LAMINECTOMY  2005 & 2009   X 2   NO ROM [place]   X 2   CHOLECYSTECTOMY  1986   DILATION AND CURETTAGE OF UTERUS   [id number]   missed abortion   ESOPHAGOGASTRODUODENOSCOPY  06/29/2011   Procedure: ESOPHAGOGASTRODUODENOSCOPY (EGD);  Surgeon: Shann Medal, MD;  Location: [facility];  Service: General;  Laterality: N/A;   FOOT SURGERY  2005   RT HEEL   GASTRIC BANDING PORT REVISION  09/24/2011   Procedure: GASTRIC BANDING PORT REVISION;  Surgeon: Pedro Earls, MD;  Location: [facility];  Service: General;  Laterality: N/A;   GASTRIC ROUX-EN-Y N/A 06/06/2017   Procedure: Conversion from Sleeve to Index;  Surgeon: Johnathan Hausen, MD;  Location: [facility];  Service: General;  Laterality: N/A;   [place]  12/30/09   LAPAROSCOPIC GASTRIC SLEEVE RESECTION N/A 07/02/2013   Procedure: LAPAROSCOPIC GASTRIC SLEEVE RESECTION upper endoscopy;  Surgeon: Pedro Earls, MD;  Location: [facility];  Service: General;  Laterality: N/A;   LAPAROSCOPIC REPAIR AND REMOVAL OF GASTRIC BAND N/A 07/02/2013   Procedure: LAPAROSCOPIC REMOVAL OF GASTRIC BAND;  Surgeon: Pedro Earls, MD;  Location: [facility];   Service: General;  Laterality: N/A;   LAPAROSCOPIC REVISION OF GASTRIC BAND  07/03/2012   Procedure: LAPAROSCOPIC REVISION OF GASTRIC BAND;  Surgeon: Pedro Earls, MD;  Location: [facility];  Service: General;  Laterality: N/A;  removal of old lap. band port, replaced with AP standard band   LAPAROSCOPY  09/24/2011   Procedure: LAPAROSCOPY DIAGNOSTIC;  Surgeon: Pedro Earls, MD;  Location: [facility];  Service: General;  Laterality: N/A;   LAPAROSCOPY  07/03/2012   Procedure: LAPAROSCOPY DIAGNOSTIC;  Surgeon: Pedro Earls, MD;  Location: [facility];  Service: General;  Laterality: N/A;  Exploratory Laparoscopy    MESH APPLIED TO LAP PORT  07/03/2012   Procedure: MESH APPLIED TO LAP PORT;  Surgeon: Pedro Earls, MD;  Location: [facility];  Service: General;  Laterality: N/A;   [place]   X 2   right knee  1981   ARTHROSCOPY AND ARTHROTOMY   right knee arthroscopy and arthrotomy  12-1979   [place]   WITH C -SECTION    Immunization History  Administered Date(s) Administered   H1N1 07/23/2008   Influenza Whole 04/24/2008   Influenza,inj,Quad PF,6+ Mos 04/11/2013, 04/04/2019   Influenza-Unspecified 04/25/2014, 03/13/2015, 03/12/2016, 03/25/2020   PFIZER Comirnaty(Gray Top)Covid-19 Tri-Sucrose Vaccine 10/20/2020   PFIZER(Purple Top)SARS-COV-2 Vaccination 07/11/2019, 07/31/2019, 03/08/2020   Pneumococcal Polysaccharide-23 07/04/2013   Td 01/02/2009   Tdap 10/04/2018   Zoster Recombinat (Shingrix) 03/21/2017, 05/25/2017    MEDICATIONS/ALLERGIES   Current Meds  Medication Sig   acetaminophen (TYLENOL) 650 MG CR tablet 2 tablets as needed   Ca Phosphate-Cholecalciferol (CALTRATE GUMMY BITES PO) Take 2 each 2 (two) times daily by mouth.    conjugated estrogens (PREMARIN) vaginal cream INSERT 1 GRAM OF CREAM PER VAGINA THREE TIMES A WEEK BY VAGINAL ROUTE.   COVID-19 mRNA Vac-TriS, Pfizer, (PFIZER-BIONT COVID-19 VAC-TRIS) SUSP  injection Inject into the muscle.   cyclobenzaprine (FLEXERIL) 10 MG tablet TAKE ONE TABLET (10 MG DOSE) BY MOUTH AT BEDTIME AS NEEDED.   diclofenac Sodium (VOLTAREN) 1 % GEL See admin instructions.   DULoxetine (CYMBALTA) 30 MG capsule 30 mg 2 (two) times daily. 2 TABS BID   estradiol (VIVELLE-DOT) 0.1 MG/24HR Place 1 patch (0.1 mg total) onto the skin 2 (two) times a week. Monday and Thursday   furosemide (LASIX) 20 MG tablet TAKE 1 TABLET BY MOUTH EVERY MORNING   gabapentin (NEURONTIN) 300 MG capsule TAKE 1 CAPSULE BY MOUTH THREE TIMES DAILY   Galcanezumab-gnlm 120 MG/ML SOAJ INJECT 120 MG INTO THE SKIN EVERY 28 (TWENTY-EIGHT) DAYS.   glucose blood test strip Test blood sugar once daily. Dx Code: E11.9   hydrALAZINE (APRESOLINE) 25 MG tablet TAKE 1 TABLET BY MOUTH UP TO TWICE DAILY IF SYSTOLIC BLOOD PRESSURE IS EQUAL OR GREATER THAN 160 (Patient taking differently: 25 mg. PT IS TAKING 1-2 TABS DAILY AS NEEDED)   hydroxychloroquine (PLAQUENIL) 200 MG tablet 200 mg 2 (two) times daily.    loratadine (CLARITIN) 10 MG tablet Take 10 mg by mouth every evening.   meclizine (ANTIVERT) 25 MG tablet Take 1 tablet (25 mg total) by mouth 3 (three) times daily as needed for dizziness.   methocarbamol (ROBAXIN) 500 MG tablet Take one tablet (500 mg dose) by mouth every 4 (four) hours as needed.   mupirocin ointment (BACTROBAN) 2 % APPLY 1 APPLICATION ON TO THE SKIN TWO TIMES DAILY   naltrexone (DEPADE) 50 MG tablet low-dose naltrexone 1.5mg  tablets, 3 tablets po qhs   NALTREXONE HCL PO 4.5 mg pt reported.   NONFORMULARY OR COMPOUNDED ITEM bp cuff  Dx hypertension   nystatin (MYCOSTATIN/NYSTOP) powder APPLY 1 APPLICATION TOPICALLY 3 (THREE) TIMES DAILY.   olmesartan (BENICAR) 20 MG tablet Take 1 tablet (20 mg total) by mouth daily with supper.   pantoprazole (PROTONIX) 40 MG tablet Take 1 tablet (40 mg total) by mouth  daily.   Pediatric Multivit-Minerals-C (ONE-A-DAY SCOOBY-DOO GUMMIES PO) Take 1 tablet 2  (two) times daily by mouth.    potassium chloride (KLOR-CON) 10 MEQ tablet TAKE 1 TABLET (10 MEQ TOTAL) BY MOUTH DAILY.   rosuvastatin (CRESTOR) 20 MG tablet TAKE 1 TABLET BY MOUTH ONCE DAILY   temazepam (RESTORIL) 30 MG capsule TAKE 1 CAPSULE (30 MG TOTAL) BY MOUTH AT BEDTIME AS NEEDED FOR SLEEP.   thiamine (VITAMIN B-1) 100 MG tablet Take 100 mg daily by mouth.   UNABLE TO FIND Med Name: Actemra infusion 4 MF one dose monthly   [DISCONTINUED] cyclobenzaprine (FLEXERIL) 10 MG tablet TAKE 1 TABLET BY MOUTH NIGHLTY   [DISCONTINUED] cyclobenzaprine (FLEXERIL) 10 MG tablet TAKE 1 TABLET BY MOUTH EVERY NIGHT AT BEDTIME AS NEEDED   [DISCONTINUED] cyclobenzaprine (FLEXERIL) 10 MG tablet TAKE ONE TABLET (10 MG DOSE) BY MOUTH 3 (THREE) TIMES A DAY AS NEEDED FOR MUSCLE SPASMS FOR UP TO 10 DAYS.   [DISCONTINUED] cyclobenzaprine (FLEXERIL) 10 MG tablet Take one tablet (10 mg dose) by mouth at bedtime as needed.   [DISCONTINUED] cyclobenzaprine (FLEXERIL) 10 MG tablet Take one tablet (10 mg dose) by mouth at bedtime as needed.   [DISCONTINUED] DULoxetine (CYMBALTA) 30 MG capsule TAKE TWO CAPSULES (60 MG DOSE) BY MOUTH 2 (TWO) TIMES DAILY.   [DISCONTINUED] DULoxetine (CYMBALTA) 30 MG capsule Take two capsules (60 mg dose) by mouth 2 (two) times daily.   [DISCONTINUED] DULoxetine (CYMBALTA) 30 MG capsule Take two capsules (60 mg dose) by mouth 2 (two) times daily.   [DISCONTINUED] estradiol (VIVELLE-DOT) 0.1 MG/24HR patch APPLY 1 PATCH TWICE A WEEK ON CLEAN DRY SKIN   [DISCONTINUED] hydroxychloroquine (PLAQUENIL) 200 MG tablet TAKE 2 TABLETS BY MOUTH DAILY   [DISCONTINUED] leflunomide (ARAVA) 10 MG tablet TAKE 1 TABLET BY MOUTH ONCE DAILY   [DISCONTINUED] methocarbamol (ROBAXIN) 500 MG tablet Take one tablet (500 mg dose) by mouth every 4 (four) hours as needed.   [DISCONTINUED] PREMARIN vaginal cream Apply 1 application topically 2 (two) times a week. Mondays & Thursdays.   [DISCONTINUED] Ubrogepant (UBRELVY)  100 MG TABS    [DISCONTINUED] Ubrogepant (UBRELVY) 100 MG TABS Take 100 mg by mouth as needed (as needed for migraine. MAX 2 tabs in 24 hours).    Allergies  Allergen Reactions   Acetaminophen     Other reaction(s): itching, whelps   Isoniazid     Severe SOB   Nitrofurantoin Shortness Of Breath    REACTION: welts   Hctz [Hydrochlorothiazide]     rash   Oseltamivir Phosphate Nausea And Vomiting    "I vomited within 30 minutes of taking it and was told I cannot ever take it again."   Promethazine Hcl [Promethazine Hcl]     IF GIVEN  IV-hallucinations     CAN TAKE PO PHENERGAN   Sulfa Antibiotics Rash   Tamiflu [Oseltamivir Phosphate] Nausea And Vomiting    "I vomited within 30 minutes of taking it and was told I cannot ever take it again."   Gabapentin     Pt states she has eye swelling    Macrolides And Ketolides     rash   Morphine Other (See Comments)    severe vomiting   Other Other (See Comments)    bandaids   Percocet [Oxycodone-Acetaminophen]     REACTION: severe itching. Can take by mouth codeine   Roxicet [Oxycodone-Acetaminophen]     itching   Sulfamethoxazole-Trimethoprim     Septra / Bactrim  --REACTION: rash  Tamiflu [Oseltamivir]     Other reaction(s): Unknown   Fentanyl Hives and Rash    REACTION: welts    SOCIAL HISTORY/FAMILY HISTORY   Reviewed in Epic:  Pertinent findings:  Social History   Tobacco Use   Smoking status: Never   Smokeless tobacco: Never  Vaping Use   Vaping Use: Never used  Substance Use Topics   Alcohol use: No    Alcohol/week: 0.0 standard drinks   Drug use: No   Social History   Social History Narrative   Right handed   Two story home   Drinks caffeine occasionally    OBJCTIVE -PE, EKG, labs   Wt Readings from Last 3 Encounters:  01/09/21 219 lb 6.4 oz (99.5 kg)  09/11/20 216 lb 3.2 oz (98.1 kg)  09/10/20 217 lb 6.4 oz (98.6 kg)    Physical Exam: BP 138/78 (BP Location: Left Arm, Patient Position: Sitting,  Cuff Size: Normal)   Pulse 86   Resp 18   Ht 5\' 11"  (1.803 m)   Wt 219 lb 6.4 oz (99.5 kg)   SpO2 98%   BMI 30.60 kg/m  Physical Exam Constitutional:      General: She is not in acute distress.    Appearance: Normal appearance. She is obese. She is not ill-appearing or toxic-appearing.     Comments: Mildly obese but otherwise well-nourished and well-groomed.  HENT:     Head: Normocephalic and atraumatic.  Neck:     Vascular: No carotid bruit.  Cardiovascular:     Rate and Rhythm: Normal rate and regular rhythm.     Pulses: Normal pulses.     Heart sounds: Murmur (1/6 SEM at RUSB) heard.    No friction rub. No gallop.  Pulmonary:     Effort: Pulmonary effort is normal. No respiratory distress.     Breath sounds: Normal breath sounds. No wheezing, rhonchi or rales.  Chest:     Chest wall: No tenderness.  Musculoskeletal:        General: No swelling. Normal range of motion.     Cervical back: Normal range of motion and neck supple.  Skin:    General: Skin is warm and dry.  Neurological:     General: No focal deficit present.     Mental Status: She is alert and oriented to person, place, and time. Mental status is at baseline.  Psychiatric:        Mood and Affect: Mood normal.        Behavior: Behavior normal.        Thought Content: Thought content normal.        Judgment: Judgment normal.     Comments: Somewhat anxious/nervous.  Very talkative    Adult ECG Report  Rate: 86 ;  Rhythm: normal sinus rhythm and incomplete RBBB.  Otherwise normal axis, intervals and durations. ;   Narrative Interpretation: Stable  Recent Labs: Reviewed Lab Results  Component Value Date   CHOL 152 07/21/2020   HDL 82.80 07/21/2020   LDLCALC 54 07/21/2020   TRIG 77.0 07/21/2020   CHOLHDL 2 07/21/2020   Lab Results  Component Value Date   CREATININE 0.8 09/08/2020   BUN 16 09/08/2020   NA 136 (A) 09/08/2020   K 4.3 09/08/2020   CL 100 09/08/2020   CO2 21 09/08/2020   CBC Latest  Ref Rng & Units 09/11/2020 09/08/2020 08/01/2020  WBC 4.0 - 10.5 K/uL 5.3 8.0 6.8  Hemoglobin 12.0 - 15.0 g/dL 11.3(L) 10.5(A) 12.1  Hematocrit 36.0 - 46.0 % 35.1(L) 33(A) 37.6  Platelets 150.0 - 400.0 K/uL 312.0 279 174    Lab Results  Component Value Date   TSH 1.85 09/08/2020    ==================================================  COVID-19 Education: The signs and symptoms of COVID-19 were discussed with the patient and how to seek care for testing (follow up with PCP or arrange E-visit).    I spent a total of 28 minutes with the patient spent in direct patient consultation.  Additional time spent with chart review  / charting (studies, outside notes, etc): 13 min Total Time: 41 min  Current medicines are reviewed at length with the patient today.  (+/- concerns) N/A   This visit occurred during the SARS-CoV-2 public health emergency.  Safety protocols were in place, including screening questions prior to the visit, additional usage of staff PPE, and extensive cleaning of exam room while observing appropriate contact time as indicated for disinfecting solutions.  Notice: This dictation was prepared with Dragon dictation along with smaller phrase technology. Any transcriptional errors that result from this process are unintentional and may not be corrected upon review.  Patient Instructions / Medication Changes & Studies & Tests Ordered   Patient Instructions  Medication Instructions:  No changes  *If you need a refill on your cardiac medications before your next appointment, please call your pharmacy*   Lab Work:  Not needed   Testing/Procedures:  Will be schedule at [address]- in Jan 2023 Your physician has requested that you have a carotid duplex. This test is an ultrasound of the carotid arteries in your neck. It looks at blood flow through these arteries that supply the brain with blood. Allow one hour for this exam. There are no restrictions or  special instructions.   Follow-Up: At [facility], you and your health needs are our priority.  As part of our continuing mission to provide you with exceptional heart care, we have created designated Provider Care Teams.  These Care Teams include your primary Cardiologist (physician) and Advanced Practice Providers (APPs -  Physician Assistants and Nurse Practitioners) who all work together to provide you with the care you need, when you need it.     Your next appointment:   7 month(s)  The format for your next appointment:   In Person  Provider:   Glenetta Hew, MD     Studies Ordered:   Orders Placed This Encounter  Procedures   EKG 12-Lead   VAS US CAROTID     Glenetta Hew, M.D., M.S. Interventional Cardiologist   Pager # [phone] Phone # [phone] [address] [place], [place] [address]   Thank you for choosing Heartcare at [facility]!!

## 2021-01-13 DIAGNOSIS — M0609 Rheumatoid arthritis without rheumatoid factor, multiple sites: Secondary | ICD-10-CM | POA: Diagnosis not present

## 2021-01-14 ENCOUNTER — Other Ambulatory Visit (HOSPITAL_COMMUNITY): Payer: Self-pay | Admitting: Rehabilitation

## 2021-01-14 DIAGNOSIS — M0609 Rheumatoid arthritis without rheumatoid factor, multiple sites: Secondary | ICD-10-CM | POA: Diagnosis not present

## 2021-01-14 DIAGNOSIS — M47896 Other spondylosis, lumbar region: Secondary | ICD-10-CM

## 2021-01-14 NOTE — Progress Notes (Signed)
Keith Rake (KeyCleophus Molt) Rx #: [id number] Roselyn Meier 100MG  tablets   Form MedImpact ePA Form 2017 NCPDP Created 21 days ago Sent to Plan 4 minutes ago Plan Response 4 minutes ago Submit Clinical Questions 1 minute ago Determination Unknown 41 minutes ago

## 2021-01-18 ENCOUNTER — Other Ambulatory Visit: Payer: Self-pay

## 2021-01-18 ENCOUNTER — Ambulatory Visit (HOSPITAL_COMMUNITY)
Admission: RE | Admit: 2021-01-18 | Discharge: 2021-01-18 | Disposition: A | Discharge status: Home / Self Care | Payer: 59 | Source: Ambulatory Visit | Attending: Rehabilitation | Admitting: Rehabilitation

## 2021-01-18 DIAGNOSIS — M47896 Other spondylosis, lumbar region: Secondary | ICD-10-CM | POA: Diagnosis not present

## 2021-01-18 DIAGNOSIS — M545 Low back pain, unspecified: Secondary | ICD-10-CM | POA: Diagnosis not present

## 2021-01-19 ENCOUNTER — Telehealth: Payer: Self-pay

## 2021-01-19 DIAGNOSIS — G43019 Migraine without aura, intractable, without status migrainosus: Secondary | ICD-10-CM

## 2021-01-19 MED ORDER — UBRELVY 100 MG PO TABS: 100.0000 mg | ORAL_TABLET | ORAL | 2 refills | Status: DC | PRN

## 2021-01-19 NOTE — Progress Notes (Signed)
Keith Rake (KeyCleophus Molt) [phone]     Status: PA Response - Denied  Created: June 15th, 2022 [id number]  Sent: July 6th, 2022  Determine if this is your patient

## 2021-01-19 NOTE — Telephone Encounter (Signed)
PA denied pt aware denied.   Will try and send script to aspn.

## 2021-01-20 ENCOUNTER — Other Ambulatory Visit (HOSPITAL_BASED_OUTPATIENT_CLINIC_OR_DEPARTMENT_OTHER): Payer: Self-pay

## 2021-01-20 MED FILL — Hydralazine HCl Tab 25 MG: ORAL | 30 days supply | Qty: 30 | Fill #1 | Status: AC

## 2021-01-24 ENCOUNTER — Encounter: Payer: Self-pay | Admitting: Cardiology

## 2021-01-24 NOTE — Assessment & Plan Note (Signed)
At this point we have her using hydralazine as needed for systolic pressures over 196-222 bpm.  She says that she has been thinking about 3-4 times in the past month.

## 2021-01-24 NOTE — Assessment & Plan Note (Signed)
Soft aortic murmur.  Not related to syncope.  Not likely to progress to being significant anytime soon.  Continue to monitor murmur.

## 2021-01-24 NOTE — Assessment & Plan Note (Signed)
Thankfully, she has not had any further episodes of syncope.  We may never totally figure out what happened during those episodes, but most likely vasovagal versus orthostatic (neurocardiogenic).  Low suspicion for arrhythmia.  May want to consider tilt table study if she has another episode.  For now continue adequate hydration and avoid excess BP control.  Would not titrate further than current dose of ARB. ->  We will switch the dosing to dinnertime as opposed to evening/bedtime to avoid the mid afternoon drops and pressures.

## 2021-01-24 NOTE — Assessment & Plan Note (Addendum)
She still has somewhat labile hypertension with some orthostasis.  I do suspect that this is probably the reason for her syncope that she had.  Continue to recommend adequate hydration along with permissive hypertension.  We will also check a carotid Doppler just to make sure that Carotid/subclavian artery stenosis is not contributing.  Try to avoid standing dose diuretic.  She is only using her Lasix as needed.  And is started wearing support stockings.  The main difficulty putting on is because of her back pain.

## 2021-01-24 NOTE — Assessment & Plan Note (Signed)
Lipid panel as of January looks great on current dose of statin.  No change

## 2021-01-24 NOTE — Assessment & Plan Note (Signed)
Per report, she has had a 20 mmHg blood pressure difference on the right arm versus left.  Will order carotid/subclavian artery Dopplers.

## 2021-01-25 MED FILL — Galcanezumab-gnlm Subcutaneous Soln Auto-Injector 120 MG/ML: SUBCUTANEOUS | 28 days supply | Qty: 1 | Fill #3 | Status: AC

## 2021-01-26 ENCOUNTER — Other Ambulatory Visit (HOSPITAL_BASED_OUTPATIENT_CLINIC_OR_DEPARTMENT_OTHER): Payer: Self-pay

## 2021-01-26 ENCOUNTER — Encounter: Payer: Self-pay | Admitting: Podiatry

## 2021-01-26 ENCOUNTER — Other Ambulatory Visit: Payer: Self-pay

## 2021-01-26 ENCOUNTER — Ambulatory Visit (INDEPENDENT_AMBULATORY_CARE_PROVIDER_SITE_OTHER): Payer: 59 | Admitting: Podiatry

## 2021-01-26 DIAGNOSIS — B351 Tinea unguium: Secondary | ICD-10-CM | POA: Diagnosis not present

## 2021-01-26 DIAGNOSIS — E119 Type 2 diabetes mellitus without complications: Secondary | ICD-10-CM | POA: Diagnosis not present

## 2021-01-26 DIAGNOSIS — M205X9 Other deformities of toe(s) (acquired), unspecified foot: Secondary | ICD-10-CM | POA: Diagnosis not present

## 2021-01-26 DIAGNOSIS — M0579 Rheumatoid arthritis with rheumatoid factor of multiple sites without organ or systems involvement: Secondary | ICD-10-CM | POA: Diagnosis not present

## 2021-01-26 MED ORDER — JUBLIA 10 % EX SOLN
CUTANEOUS | 11 refills | Status: DC
  Filled 2021-01-26: qty 8, 90d supply, fill #0

## 2021-01-26 NOTE — Progress Notes (Signed)
Subjective:  Patient ID: [name], female    DOB: 05/09/1956,  MRN: [id number]  65 y.o. female presents with preventative diabetic foot care and corn(s) b/l feet and painful thick toenails that are difficult to trim. Painful toenails interfere with ambulation. Aggravating factors include wearing enclosed shoe gear. Pain is relieved with periodic professional debridement. Painful corns are aggravated when weightbearing when wearing enclosed shoe gear. Pain is relieved with periodic professional debridement.  [name] states she has continued to use her Aquaphor Ointment on her feet daily and that has made a significant improvement in the appearance of her feet.   She is concerned about toes 3, 4,5 right foot. She suspects they are curling more from a previous fall.   Patient's blood sugar was 98 mg/dl on today.  She does have an upcoming appointment was 10/07/2020.  PCP: [name], DO and last visit was:   Review of Systems: Negative except as noted in the HPI.   Allergies  Allergen Reactions   Acetaminophen     Other reaction(s): itching, whelps   Isoniazid     Severe SOB   Nitrofurantoin Shortness Of Breath    REACTION: welts   Hctz [Hydrochlorothiazide]     rash   Oseltamivir Phosphate Nausea And Vomiting    "I vomited within 30 minutes of taking it and was told I cannot ever take it again."   Promethazine Hcl [Promethazine Hcl]     IF GIVEN  IV-hallucinations     CAN TAKE PO PHENERGAN   Sulfa Antibiotics Rash   Tamiflu [Oseltamivir Phosphate] Nausea And Vomiting    "I vomited within 30 minutes of taking it and was told I cannot ever take it again."   Wound Dressing Adhesive Rash    Adhesive Tape    Gabapentin     Pt states she has eye swelling    Macrolides And Ketolides     rash   Morphine Other (See Comments)    severe vomiting   Other Other (See Comments)    bandaids   Percocet [Oxycodone-Acetaminophen]     REACTION: severe itching. Can take  by mouth codeine   Roxicet [Oxycodone-Acetaminophen]     itching   Sulfamethoxazole-Trimethoprim     Septra / Bactrim  --REACTION: rash   Tamiflu [Oseltamivir]     Other reaction(s): Unknown   Fentanyl Hives and Rash    REACTION: welts    Objective:  There were no vitals filed for this visit. Constitutional Patient is a pleasant 65 y.o. Caucasian female obese in NAD. AAO x 3.  Vascular Capillary fill time to digits <3 seconds b/l lower extremities. Palpable pedal pulses b/l LE. Pedal hair sparse. Lower extremity skin temperature gradient within normal limits. No edema noted b/l lower extremities. No cyanosis or clubbing noted.  Neurologic Normal speech. Pt has subjective symptoms of neuropathy. Protective sensation intact 5/5 intact bilaterally with 10g monofilament b/l. Vibratory sensation intact b/l.  Dermatologic Pedal skin with normal turgor, texture and tone left lower extremity. No open wounds b/l lower extremities. No interdigital macerations b/l lower extremities. Toenails 1-5 b/l discolored, dystrophic, thickened, crumbly with subungual debris and tenderness to dorsal palpation. No hyperkeratotic nor porokeratotic lesions present on today's visit.  Orthopedic: Normal muscle strength 5/5 to all lower extremity muscle groups bilaterally. No pain crepitus or joint limitation noted with ROM b/l. Her toes appear to be curling more at DIPJ consistent with clawtoe deformity. She is wearing flip flops. Mild inversion deformity of right  ankle.   Hemoglobin A1C Latest Ref Rng & Units 07/21/2020  HGBA1C 4.6 - 6.5 % 6.1  Some recent data might be hidden    Assessment:   1. Onychomycosis   2. Acquired claw toe, unspecified laterality   3. Rheumatoid arthritis involving multiple sites with positive rheumatoid factor ([place])   4. Controlled type 2 diabetes mellitus without complication, without long-term current use of insulin ([place])    Plan:  Patient was evaluated and treated and all questions  answered.  Onychomycosis with pain -Nails palliatively debridement as below. -Educated on self-care  Procedure: Nail Debridement Rationale: Pain Type of Debridement: manual, sharp debridement. Instrumentation: Nail nipper, rotary burr. Number of Nails: 10  -Examined patient. -Her corns have resolved. -For her clawtoe deformity/inversion of right ankle, I have asked her to discuss benefit of AFO for her right LE with her neurologist.  -Patient to continue soft, supportive shoe gear daily. -Toenails 1-5 b/l were debrided in length and girth with sterile nail nippers and dremel without iatrogenic bleeding.  -Patient to report any pedal injuries to medical professional immediately. -For onychomycosis, Rx sent to pharmacy for Jublia 10% Solution to be applied to toenails once daily for 48 weeks. AVS will be mailed to her home as we experienced a system-wide outage today. -Patient/POA to call should there be question/concern in the interim.  Return in about 3 months (around 04/28/2021).  Marzetta Board, DPM

## 2021-01-26 NOTE — Patient Instructions (Signed)
Efinaconazole Topical Solution What is this medication? EFINACONAZOLE (e FEE na KON a zole) is an antifungal medicine. It is used totreat certain kinds of fungal infections of the toenail. This medicine may be used for other purposes; ask your health care provider orpharmacist if you have questions. COMMON BRAND NAME(S): JUBLIA What should I tell my care team before I take this medication? They need to know if you have any of these conditions: an unusual or allergic reaction to efinaconazole, other medicines, foods, dyes or preservatives pregnant or trying to get pregnant breast-feeding How should I use this medication? This medicine is for external use only. Do not take by mouth. Follow the directions on the label. Wash hands before and after use. Apply this medicine using the provided brush to cover the entire toenail. Do not use your medicine more often than directed. Finish the full course prescribed by your doctor or health care professional even if you think your condition is better. Do notstop using except on the advice of your doctor or health care professional. Talk to your pediatrician regarding the use of this medicine in children. While this drug may be prescribed for children as young as 6 years for selectedconditions, precautions do apply. Overdosage: If you think you have taken too much of this medicine contact apoison control center or emergency room at once. NOTE: This medicine is only for you. Do not share this medicine with others. What if I miss a dose? If you miss a dose, use it as soon as you can. If it is almost time for yournext dose, use only that dose. Do not use double or extra doses. What may interact with this medication? Interactions have not been studied. Do not use any other nail products (i.e.,nail polish, pedicures) during treatment with this medicine. This list may not describe all possible interactions. Give your health care provider a list of all the medicines,  herbs, non-prescription drugs, or dietary supplements you use. Also tell them if you smoke, drink alcohol, or use illegaldrugs. Some items may interact with your medicine. What should I watch for while using this medication? Do not get this medicine in your eyes. If you do, rinse out with plenty of cooltap water. Tell your doctor or health care professional if your symptoms do not start toget better or if they get worse. Wait for at least 10 minutes after bathing before applying this medication. After bathing, make sure that your feet are very dry. Fungal infections like moist conditions. Do not walk around barefoot. To help prevent reinfection, wear freshly washed cotton, not synthetic clothing. Tell your doctor or health care professional if you develop sores or blisters that do not heal properly. If your nail infection returns after you stop using this medicine, contact yourdoctor or health care professional. What side effects may I notice from receiving this medication? Side effects that you should report to your doctor or health care professionalas soon as possible: allergic reactions like skin rash, itching or hives, swelling of the face, lips, or tongue ingrown toenail Side effects that usually do not require medical attention (report to yourdoctor or health care professional if they continue or are bothersome): mild skin irritation, burning, or itching This list may not describe all possible side effects. Call your doctor for medical advice about side effects. You may report side effects to FDA at1-800-FDA-1088. Where should I keep my medication? Keep out of the reach of children. Store at room temperature between 20 and 25 degrees C (68  and 77 degrees F). Keep this medicine in the original container. Throw away any unused medicineafter the expiration date. This medicine is flammable. Avoid exposure to heat, fire, flame, and smoking. NOTE: This sheet is a summary. It may not cover all  possible information. If you have questions about this medicine, talk to your doctor, pharmacist, orhealth care provider.  2022 Elsevier/Gold Standard (2018-11-06 16:14:11)

## 2021-01-27 ENCOUNTER — Other Ambulatory Visit (HOSPITAL_BASED_OUTPATIENT_CLINIC_OR_DEPARTMENT_OTHER): Payer: Self-pay

## 2021-01-29 ENCOUNTER — Other Ambulatory Visit (HOSPITAL_BASED_OUTPATIENT_CLINIC_OR_DEPARTMENT_OTHER): Payer: Self-pay

## 2021-01-30 ENCOUNTER — Telehealth: Payer: Self-pay | Admitting: Podiatry

## 2021-01-30 ENCOUNTER — Other Ambulatory Visit (HOSPITAL_BASED_OUTPATIENT_CLINIC_OR_DEPARTMENT_OTHER): Payer: Self-pay

## 2021-01-30 ENCOUNTER — Other Ambulatory Visit: Payer: Self-pay

## 2021-01-30 MED ORDER — HYDROXYCHLOROQUINE SULFATE 200 MG PO TABS
ORAL_TABLET | ORAL | 3 refills | Status: DC
  Filled 2021-01-30: qty 60, 30d supply, fill #0
  Filled 2021-03-19: qty 60, 30d supply, fill #1
  Filled 2021-04-23: qty 60, 30d supply, fill #2
  Filled 2021-07-07: qty 60, 30d supply, fill #3

## 2021-01-30 MED ORDER — HYDROXYCHLOROQUINE SULFATE 200 MG PO TABS
ORAL_TABLET | ORAL | 3 refills | Status: DC
  Filled 2021-01-30: qty 60, 30d supply, fill #0

## 2021-01-30 MED FILL — Cyclobenzaprine HCl Tab 10 MG: ORAL | 30 days supply | Qty: 30 | Fill #0 | Status: AC

## 2021-01-30 NOTE — Telephone Encounter (Signed)
I did send the Rx to the pharmacy on the day of her visit. Have her call the pharmacy and see if the insurance is refusing to pay for it. That's the only reason I can think they wouldn't fill the prescription. Thanks so much!

## 2021-01-30 NOTE — Telephone Encounter (Signed)
Pt called to follow up about Anti-Fungal medication. She stated that it was suppose to be sent to he pharmacy.  Please advise  [facility]

## 2021-02-02 ENCOUNTER — Encounter: Payer: Self-pay | Admitting: Podiatry

## 2021-02-02 ENCOUNTER — Other Ambulatory Visit (HOSPITAL_BASED_OUTPATIENT_CLINIC_OR_DEPARTMENT_OTHER): Payer: Self-pay

## 2021-02-02 ENCOUNTER — Other Ambulatory Visit (INDEPENDENT_AMBULATORY_CARE_PROVIDER_SITE_OTHER): Payer: 59 | Admitting: Podiatry

## 2021-02-02 DIAGNOSIS — B351 Tinea unguium: Secondary | ICD-10-CM

## 2021-02-02 MED ORDER — JUBLIA 10 % EX SOLN: CUTANEOUS | 11 refills | Status: DC

## 2021-02-02 NOTE — Progress Notes (Signed)
Per MyChart, patient requested prescription for Jublia be sent to [facility] on [address] in [facility], as she has a coupon/discount for the medication. Rx sent as requested by Mrs. Coglianese.

## 2021-02-03 ENCOUNTER — Other Ambulatory Visit (HOSPITAL_BASED_OUTPATIENT_CLINIC_OR_DEPARTMENT_OTHER): Payer: Self-pay

## 2021-02-03 DIAGNOSIS — M48062 Spinal stenosis, lumbar region with neurogenic claudication: Secondary | ICD-10-CM | POA: Diagnosis not present

## 2021-02-03 DIAGNOSIS — M4726 Other spondylosis with radiculopathy, lumbar region: Secondary | ICD-10-CM | POA: Diagnosis not present

## 2021-02-03 DIAGNOSIS — M4316 Spondylolisthesis, lumbar region: Secondary | ICD-10-CM | POA: Diagnosis not present

## 2021-02-03 DIAGNOSIS — M5431 Sciatica, right side: Secondary | ICD-10-CM | POA: Diagnosis not present

## 2021-02-05 ENCOUNTER — Encounter: Payer: Self-pay | Admitting: Family Medicine

## 2021-02-11 DIAGNOSIS — M79671 Pain in right foot: Secondary | ICD-10-CM | POA: Diagnosis not present

## 2021-02-11 DIAGNOSIS — M0609 Rheumatoid arthritis without rheumatoid factor, multiple sites: Secondary | ICD-10-CM | POA: Diagnosis not present

## 2021-02-12 DIAGNOSIS — M797 Fibromyalgia: Secondary | ICD-10-CM | POA: Diagnosis not present

## 2021-02-12 DIAGNOSIS — M2550 Pain in unspecified joint: Secondary | ICD-10-CM | POA: Diagnosis not present

## 2021-02-12 DIAGNOSIS — G894 Chronic pain syndrome: Secondary | ICD-10-CM | POA: Diagnosis not present

## 2021-02-12 DIAGNOSIS — M5417 Radiculopathy, lumbosacral region: Secondary | ICD-10-CM | POA: Diagnosis not present

## 2021-02-12 DIAGNOSIS — M533 Sacrococcygeal disorders, not elsewhere classified: Secondary | ICD-10-CM | POA: Diagnosis not present

## 2021-02-12 DIAGNOSIS — M7918 Myalgia, other site: Secondary | ICD-10-CM | POA: Diagnosis not present

## 2021-02-12 DIAGNOSIS — M461 Sacroiliitis, not elsewhere classified: Secondary | ICD-10-CM | POA: Diagnosis not present

## 2021-02-13 ENCOUNTER — Encounter: Payer: Self-pay | Admitting: Family Medicine

## 2021-02-18 ENCOUNTER — Ambulatory Visit: Payer: 59 | Admitting: Cardiology

## 2021-02-20 ENCOUNTER — Other Ambulatory Visit (HOSPITAL_BASED_OUTPATIENT_CLINIC_OR_DEPARTMENT_OTHER): Payer: Self-pay

## 2021-02-23 DIAGNOSIS — M545 Low back pain, unspecified: Secondary | ICD-10-CM | POA: Diagnosis not present

## 2021-02-23 DIAGNOSIS — M5416 Radiculopathy, lumbar region: Secondary | ICD-10-CM | POA: Diagnosis not present

## 2021-02-23 DIAGNOSIS — M4316 Spondylolisthesis, lumbar region: Secondary | ICD-10-CM | POA: Diagnosis not present

## 2021-02-23 DIAGNOSIS — M4317 Spondylolisthesis, lumbosacral region: Secondary | ICD-10-CM | POA: Diagnosis not present

## 2021-02-23 DIAGNOSIS — M459 Ankylosing spondylitis of unspecified sites in spine: Secondary | ICD-10-CM | POA: Diagnosis not present

## 2021-02-24 ENCOUNTER — Encounter: Payer: Self-pay | Admitting: Family Medicine

## 2021-02-24 DIAGNOSIS — M5416 Radiculopathy, lumbar region: Secondary | ICD-10-CM | POA: Diagnosis not present

## 2021-02-24 DIAGNOSIS — G894 Chronic pain syndrome: Secondary | ICD-10-CM | POA: Diagnosis not present

## 2021-02-25 ENCOUNTER — Other Ambulatory Visit (HOSPITAL_BASED_OUTPATIENT_CLINIC_OR_DEPARTMENT_OTHER): Payer: Self-pay

## 2021-02-25 MED ORDER — TRAMADOL HCL 50 MG PO TABS
50.0000 mg | ORAL_TABLET | Freq: Three times a day (TID) | ORAL | 1 refills | Status: DC | PRN
  Filled 2021-02-25: qty 90, 30d supply, fill #0

## 2021-02-26 NOTE — Progress Notes (Signed)
NEUROLOGY FOLLOW UP OFFICE NOTE  [name] [id number]  Assessment/Plan:   Migraines. Dizziness with episode of syncope.  Secondary to orthostatic hypotension  Migraine prevention:  Emgality Migraine rescue:  Ubrelvy Limit use of pain relievers to no more than 2 days out of week to prevent risk of rebound or medication-overuse headache. Keep headache diary Follow up 6 months.   Subjective:  [name] is a 65 year old female with fibromyalgia, RA, HTN, IBS, depression and type 2 diabetes who follows up for headache and dizziness.  UPDATE: EEG on 09/03/2020 was normal.   Started Emgality in February. She reports significant emotional stress trying to get disability.  She just received approval.  Also, due to her health issues, her employer separated employment.  Therefore, migraines are worse.  She has a new pain specialist.  Typically, migraines have been: Intensity:  moderate-severe Duration:  2-2 1/2 hours Frequency:  1 a month Current NSAIDS/analgesics:  Tylenol, tramadol Current triptans:  none Current ergotamine:  none Current anti-emetic:  none Current muscle relaxants:  Flexeril, Robaxin '500mg'$  Current Antihypertensive medications:  olmesartan Current Antidepressant medications:  Cymbalta '30mg'$  BID Current Anticonvulsant medications:  none Current anti-CGRP:  Emgality, Ubrelvy '100mg'$  Current Vitamins/Herbal/Supplements:  B1, KCl, MVI Current Antihistamines/Decongestants:  Meclizine, Claritin Other therapy:  none Hormone/birth control:  estradiol Other medications:  Plaquenil, alprazolam, Restoril   HISTORY:  In 2021, she was tried on different immunosuppressant therapies for her RA.  In addition to her RA pain, she has fibromyalgia, osteoarthritis, lumbosacral spondylosis and sacroiliac pain, for which she is managed by pain medicine.  She began experiencing daily headaches in July.  The following month, she began experiencing dizziness.  She was  found to have a new murmur and was referred to cardiology.  Echocardiogram in August was normal with EF 60-65%.  Murmur was thought to be secondary to mild aortic valve sclerosis.  The dizziness occurs spontaneously and not positional.  She will experience severe spinning and lightheadedness lasting several minutes and occurring off and on during the day.  It may occur with the headaches but also by itself.  Headaches are described as moderate pressure in a cap distribution, sometimes associated with nausea, photophobia and phonophobia, sometimes flickers or spots in her vision.  They last several hours, sometimes all day.  They may occur daily for a couple of weeks and then they may be episodic with one or two headache-free days in between.  On 07/09/2020, she had two syncopal spells that morning.  The first time, she walked into the kitchen and suddenly passed out.  She woke up and started walking out of the kitchen back to bed when she suddenly passed out again.  Witnessed by her daughter.  The events lasted about 30 seconds, no convulsions, tongue biting, palpitations or postictal confusion. However, she did have urinary incontinence.  Blood pressure was reportedly 92/52.  She is currently wearing a cardiac event monitor.  Carotid ultrasound on 07/30/2020 showed no hemodynamically significant ICA stenosis.  She went to the ED on 08/01/2020 for dizziness, left arm tingling and systolic BP [id number].  CT head personally reviewed was negative for acute abnormalities.  Basic labs and troponin were negative.  EKG showed new RBBB but nothing acute.  She later had MRI and MRA of brain on 08/08/2020 personally reviewed showed mild bilateral mastoid effusions but otherwise unremarkable.  Since then, blood pressure has been elevated.     She has remote history of migraines in 1999-2000.  They were severe headaches with diaphoresis, nausea, photophobia and phonophobia.  She was treated with propranolol for a time.  No history  of seizures.    Past NSAIDS/analgesics:  none Past abortive triptans:  none Past abortive ergotamine:  none Past muscle relaxants:  none Past anti-emetic:  Zofran, Reglan, Phenergan Past antihypertensive medications:  Lasix Past antidepressant medications:  Amitriptyline, nortriptyline, citalopram Past anticonvulsant medications:  Gabapentin, Lyrica Past anti-CGRP:  none Past vitamins/Herbal/Supplements:  none Past antihistamines/decongestants:  none Other past therapies:  none  PAST MEDICAL HISTORY: Past Medical History:  Diagnosis Date   Allergic rhinitis    Anxiety    Asthma    hx bronchial asthma at times with upper resp infection   Depression    Fibromyalgia    GERD (gastroesophageal reflux disease)    Headache(784.0)    migraine and cluster headaches   Hyperlipemia    Hypertension    IBS (irritable bowel syndrome)    Menopause    Neuromuscular disorder ([place]) 2009   hx of fibromyalgia, polyarthralsia (surgery induced)   Osteoarthritis    Rheumatoid arthritis ([place])    Complicated by osteoarthritis as well.   Rheumatoid arthritis ([place])    Stress incontinence    at times   Tibial fracture 10/14/2016   evulsion periostial right   Type 2 diabetes mellitus with complication, without long-term current use of insulin ([facility])    diet controlled no meds -> however was diagnosed with a diabetic foot ulcer.    MEDICATIONS: Current Outpatient Medications on File Prior to Visit  Medication Sig Dispense Refill   acetaminophen (TYLENOL) 650 MG CR tablet 2 tablets as needed     amitriptyline (ELAVIL) 25 MG tablet Take 1 tablet by mouth at bedtime.     Ca Phosphate-Cholecalciferol (CALTRATE GUMMY BITES PO) Take 2 each 2 (two) times daily by mouth.      citalopram (CELEXA) 20 MG tablet Take by mouth.     conjugated estrogens (PREMARIN) vaginal cream INSERT 1 GRAM OF CREAM PER VAGINA THREE TIMES A WEEK BY VAGINAL ROUTE. 30 g 5   COVID-19 mRNA Vac-TriS, Pfizer, (PFIZER-BIONT  COVID-19 VAC-TRIS) SUSP injection Inject into the muscle. 0.3 mL 0   cyclobenzaprine (FLEXERIL) 10 MG tablet TAKE ONE TABLET (10 MG DOSE) BY MOUTH AT BEDTIME AS NEEDED. 30 tablet 2   diclofenac Sodium (VOLTAREN) 1 % GEL See admin instructions.     DULoxetine (CYMBALTA) 30 MG capsule 30 mg 2 (two) times daily. 2 TABS BID     DULoxetine (CYMBALTA) 30 MG capsule Take by mouth.     Efinaconazole (JUBLIA) 10 % SOLN Apply to affected toenails once daily for 48 weeks. 8 mL 11   estradiol (VIVELLE-DOT) 0.1 MG/24HR Place 1 patch (0.1 mg total) onto the skin 2 (two) times a week. Monday and Thursday 8 patch 11   furosemide (LASIX) 20 MG tablet TAKE 1 TABLET BY MOUTH EVERY MORNING 90 tablet 1   gabapentin (NEURONTIN) 300 MG capsule TAKE 1 CAPSULE BY MOUTH THREE TIMES DAILY 90 capsule 2   gabapentin (NEURONTIN) 300 MG capsule Take by mouth.     Galcanezumab-gnlm 120 MG/ML SOAJ INJECT 120 MG INTO THE SKIN EVERY 28 (TWENTY-EIGHT) DAYS. 1 mL 5   glucose blood test strip Test blood sugar once daily. Dx Code: E11.9 100 each 12   hydrALAZINE (APRESOLINE) 25 MG tablet TAKE 1 TABLET BY MOUTH UP TO TWICE DAILY IF SYSTOLIC BLOOD PRESSURE IS EQUAL OR GREATER THAN 160 (Patient taking differently: 25 mg. PT  IS TAKING 1-2 TABS DAILY AS NEEDED) 30 tablet 6   HYDROcodone-acetaminophen (NORCO/VICODIN) 5-325 MG tablet Take by mouth.     hydroxychloroquine (PLAQUENIL) 200 MG tablet 200 mg 2 (two) times daily.      hydroxychloroquine (PLAQUENIL) 200 MG tablet Take 2 tablets by mouth daily.     hydroxychloroquine (PLAQUENIL) 200 MG tablet Take 2 tablets by mouth once daily 60 tablet 3   hydroxychloroquine (PLAQUENIL) 200 MG tablet Take 2 tablets by mouth once daily 60 tablet 3   leflunomide (ARAVA) 20 MG tablet Take 1 tablet by mouth daily.     loratadine (CLARITIN) 10 MG tablet Take 10 mg by mouth every evening.     meclizine (ANTIVERT) 25 MG tablet Take 1 tablet (25 mg total) by mouth 3 (three) times daily as needed for  dizziness. 20 tablet 0   methocarbamol (ROBAXIN) 500 MG tablet Take one tablet (500 mg dose) by mouth every 4 (four) hours as needed. 180 tablet 2   methocarbamol (ROBAXIN) 500 MG tablet Take by mouth.     Misc. Devices MISC Low-dose Naltrexone 1.'5mg'$  tablets, 3 tablets po qhs     naltrexone (DEPADE) 50 MG tablet low-dose naltrexone 1.'5mg'$  tablets, 3 tablets po qhs 90 tablet 2   NALTREXONE HCL PO 4.5 mg pt reported.     NONFORMULARY OR COMPOUNDED ITEM bp cuff  Dx hypertension 1 each 0   nystatin (MYCOSTATIN/NYSTOP) powder APPLY 1 APPLICATION TOPICALLY 3 (THREE) TIMES DAILY. 15 g 5   nystatin (MYCOSTATIN/NYSTOP) powder Apply topically.     olmesartan (BENICAR) 20 MG tablet Take 1 tablet (20 mg total) by mouth daily with supper. 90 tablet 3   olmesartan (BENICAR) 20 MG tablet Take by mouth.     pantoprazole (PROTONIX) 40 MG tablet Take 1 tablet (40 mg total) by mouth daily. 90 tablet 3   Pediatric Multivit-Minerals-C (ONE-A-DAY SCOOBY-DOO GUMMIES PO) Take 1 tablet 2 (two) times daily by mouth.      potassium chloride (KLOR-CON) 10 MEQ tablet TAKE 1 TABLET (10 MEQ TOTAL) BY MOUTH DAILY. 90 tablet 1   potassium chloride (MICRO-K) 10 MEQ CR capsule Take by mouth.     rosuvastatin (CRESTOR) 20 MG tablet TAKE 1 TABLET BY MOUTH ONCE DAILY 90 tablet 2   temazepam (RESTORIL) 30 MG capsule TAKE 1 CAPSULE (30 MG TOTAL) BY MOUTH AT BEDTIME AS NEEDED FOR SLEEP. 90 capsule 3   thiamine (VITAMIN B-1) 100 MG tablet Take 100 mg daily by mouth.     traMADol (ULTRAM) 50 MG tablet Take 1 tablet (50 mg total) by mouth every 8 (eight) hours as needed. 90 tablet 1   Ubrogepant (UBRELVY) 100 MG TABS Take 100 mg by mouth as needed (as needed for migraine. MAX 2 tabs in 24 hours). 16 tablet 2   UNABLE TO FIND Med Name: Actemra infusion 4 MF one dose monthly     No current facility-administered medications on file prior to visit.    ALLERGIES: Allergies  Allergen Reactions   Acetaminophen     Other reaction(s):  itching, whelps   Isoniazid     Severe SOB   Nitrofurantoin Shortness Of Breath    REACTION: welts   Hctz [Hydrochlorothiazide]     rash   Oseltamivir Phosphate Nausea And Vomiting    "I vomited within 30 minutes of taking it and was told I cannot ever take it again."   Promethazine Hcl [Promethazine Hcl]     IF GIVEN  IV-hallucinations     CAN  TAKE PO PHENERGAN   Sulfa Antibiotics Rash   Tamiflu [Oseltamivir Phosphate] Nausea And Vomiting    "I vomited within 30 minutes of taking it and was told I cannot ever take it again."   Wound Dressing Adhesive Rash    Adhesive Tape    Gabapentin     Pt states she has eye swelling    Macrolides And Ketolides     rash   Morphine Other (See Comments)    severe vomiting   Other Other (See Comments)    bandaids   Percocet [Oxycodone-Acetaminophen]     REACTION: severe itching. Can take by mouth codeine   Roxicet [Oxycodone-Acetaminophen]     itching   Sulfamethoxazole-Trimethoprim     Septra / Bactrim  --REACTION: rash   Tamiflu [Oseltamivir]     Other reaction(s): Unknown   Fentanyl Hives and Rash    REACTION: welts    FAMILY HISTORY: Family History  Problem Relation Age of Onset   Diabetes Mother    Stroke Mother    Breast cancer Mother    Atrial fibrillation Mother    Hypertension Mother    Cancer Mother 55       breast   Diabetes Father    Stroke Father 34       2nd 50 month apart   Hypertension Father    Heart attack Father 79       stents   Dementia Father    AAA (abdominal aortic aneurysm) Father 88       repair   CAD Father 59   Thyroid disease Other    Heart disease Other    COPD Other       Objective:  Blood pressure (!) 158/84, pulse 79, height '5\' 11"'$  (1.803 m), weight 214 lb (97.1 kg), SpO2 99 %. General: No acute distress.  Patient appears well-groomed.    Amy Clines, DO  CC: Roma Schanz, DO

## 2021-02-27 ENCOUNTER — Encounter: Payer: Self-pay | Admitting: Neurology

## 2021-02-27 ENCOUNTER — Other Ambulatory Visit: Payer: Self-pay

## 2021-02-27 ENCOUNTER — Ambulatory Visit: Payer: 59 | Admitting: Neurology

## 2021-02-27 VITALS — BP 158/84 | HR 79 | Ht 71.0 in | Wt 214.0 lb

## 2021-02-27 DIAGNOSIS — G43019 Migraine without aura, intractable, without status migrainosus: Secondary | ICD-10-CM

## 2021-02-27 NOTE — Patient Instructions (Signed)
1. Continue Emgality every 28 days 2.  Ubrelvy as needed 3.  Keep headache diary

## 2021-03-02 MED FILL — Galcanezumab-gnlm Subcutaneous Soln Auto-Injector 120 MG/ML: SUBCUTANEOUS | 28 days supply | Qty: 1 | Fill #4 | Status: AC

## 2021-03-03 ENCOUNTER — Other Ambulatory Visit (HOSPITAL_BASED_OUTPATIENT_CLINIC_OR_DEPARTMENT_OTHER): Payer: Self-pay

## 2021-03-03 NOTE — Progress Notes (Signed)
Keith Rake Key: [id number] - PA Case ID: [id number] - Rx #: [id number] Need help? Call us at [phone] Outcome Approvedtoday The request has been approved. The authorization is effective for a maximum of 12 fills from 03/03/2021 to 03/02/2022, as long as the member is enrolled in their current health plan. The request was approved with a quantity restriction. This has been approved for a quantity limit of 1 with a day supply limit of 30. A written notification letter will follow with additional details

## 2021-03-03 NOTE — Progress Notes (Signed)
Rachella Ehresmann (Key: [id number]) - [id number] Emgality '120MG'$ /ML auto-injectors (migraine)     Status: PA Request  Created: August 23rd, [id number]  Sent: August 23rd, 2022

## 2021-03-04 DIAGNOSIS — Z9884 Bariatric surgery status: Secondary | ICD-10-CM | POA: Diagnosis not present

## 2021-03-04 DIAGNOSIS — M793 Panniculitis, unspecified: Secondary | ICD-10-CM | POA: Diagnosis not present

## 2021-03-10 ENCOUNTER — Telehealth: Payer: Self-pay | Admitting: Family Medicine

## 2021-03-10 NOTE — Telephone Encounter (Signed)
Papers faxed into front office  Placed in [place] bin up front

## 2021-03-11 DIAGNOSIS — M0609 Rheumatoid arthritis without rheumatoid factor, multiple sites: Secondary | ICD-10-CM | POA: Diagnosis not present

## 2021-03-11 NOTE — Telephone Encounter (Signed)
Received

## 2021-03-12 ENCOUNTER — Telehealth: Payer: Self-pay | Admitting: Cardiology

## 2021-03-12 DIAGNOSIS — M5416 Radiculopathy, lumbar region: Secondary | ICD-10-CM | POA: Diagnosis not present

## 2021-03-12 NOTE — Telephone Encounter (Signed)
FMLA completed and faxed.

## 2021-03-12 NOTE — Telephone Encounter (Signed)
Colon received  short term disability forms from the [employer], and forms were put in [place] box on 9/1.

## 2021-03-19 ENCOUNTER — Other Ambulatory Visit (HOSPITAL_BASED_OUTPATIENT_CLINIC_OR_DEPARTMENT_OTHER): Payer: Self-pay

## 2021-03-19 MED FILL — Temazepam Cap 30 MG: ORAL | 90 days supply | Qty: 90 | Fill #1 | Status: AC

## 2021-03-19 MED FILL — Hydralazine HCl Tab 25 MG: ORAL | 30 days supply | Qty: 30 | Fill #2 | Status: AC

## 2021-03-19 MED FILL — Nystatin Topical Powder 100000 Unit/GM: CUTANEOUS | 10 days supply | Qty: 15 | Fill #1 | Status: AC

## 2021-03-19 NOTE — Telephone Encounter (Signed)
Probably the best thing to do would be to start simply taking hydralazine 25 mg 2 times daily as a standing medication (hold if feeling dizzy or SBP < 110 mmHg).  Can still use additional 25 mg as needed for SBP > 150 mmHg.  We can change Rx to: Hydralazine 25 mg PO BID - Disp 180 tab, 3 refill.  Glenetta Hew, MD

## 2021-03-20 ENCOUNTER — Other Ambulatory Visit (HOSPITAL_BASED_OUTPATIENT_CLINIC_OR_DEPARTMENT_OTHER): Payer: Self-pay

## 2021-03-20 MED ORDER — HYDRALAZINE HCL 25 MG PO TABS
25.0000 mg | ORAL_TABLET | Freq: Two times a day (BID) | ORAL | 3 refills | Status: DC
  Filled 2021-03-20 – 2021-04-09 (×2): qty 180, 90d supply, fill #0
  Filled 2021-07-09: qty 180, 90d supply, fill #1
  Filled 2021-10-09: qty 180, 90d supply, fill #2
  Filled 2022-01-07: qty 180, 90d supply, fill #3

## 2021-03-23 NOTE — Telephone Encounter (Signed)
9/12 received forms back from provider, called patient to let her know that we will need her to come by [facility] office to sign release, billing form, and pay $29 forms fee (cash, check, money order). Patient stated we can come by [place] office tomorrow 9/13.

## 2021-03-24 DIAGNOSIS — M4726 Other spondylosis with radiculopathy, lumbar region: Secondary | ICD-10-CM | POA: Diagnosis not present

## 2021-03-24 DIAGNOSIS — M5431 Sciatica, right side: Secondary | ICD-10-CM | POA: Diagnosis not present

## 2021-03-24 DIAGNOSIS — M4316 Spondylolisthesis, lumbar region: Secondary | ICD-10-CM | POA: Diagnosis not present

## 2021-03-24 DIAGNOSIS — M48062 Spinal stenosis, lumbar region with neurogenic claudication: Secondary | ICD-10-CM | POA: Diagnosis not present

## 2021-03-24 DIAGNOSIS — Z0279 Encounter for issue of other medical certificate: Secondary | ICD-10-CM

## 2021-03-24 NOTE — Telephone Encounter (Signed)
03/24/21 - patient came into office to complete release, billing form, and $29 forms fee.   - forms were faxed , patient was given a copy.  - Email sent to billing, waiting for payment to be posted.

## 2021-03-25 ENCOUNTER — Encounter: Payer: Self-pay | Admitting: Family Medicine

## 2021-03-26 ENCOUNTER — Other Ambulatory Visit: Payer: Self-pay | Admitting: Family Medicine

## 2021-03-26 ENCOUNTER — Telehealth: Payer: Self-pay | Admitting: *Deleted

## 2021-03-26 ENCOUNTER — Other Ambulatory Visit (HOSPITAL_BASED_OUTPATIENT_CLINIC_OR_DEPARTMENT_OTHER): Payer: Self-pay

## 2021-03-26 DIAGNOSIS — Z20822 Contact with and (suspected) exposure to covid-19: Secondary | ICD-10-CM | POA: Diagnosis not present

## 2021-03-26 DIAGNOSIS — R059 Cough, unspecified: Secondary | ICD-10-CM

## 2021-03-26 MED ORDER — CARESTART COVID-19 HOME TEST VI KIT
PACK | 0 refills | Status: DC
  Filled 2021-03-26: qty 2, 4d supply, fill #0

## 2021-03-26 MED ORDER — PROMETHAZINE-CODEINE 6.25-10 MG/5ML PO SYRP
5.0000 mL | ORAL_SOLUTION | Freq: Four times a day (QID) | ORAL | 0 refills | Status: DC | PRN
Start: 2021-03-26 — End: 2021-07-21
  Filled 2021-03-26: qty 120, 6d supply, fill #0

## 2021-03-26 NOTE — Telephone Encounter (Signed)
Pt scheduled for a vv tomorrow. Pt states getting a COVID test this evening.

## 2021-03-26 NOTE — Telephone Encounter (Signed)
Patient notified that rx has been sent in. 

## 2021-03-26 NOTE — Telephone Encounter (Signed)
Patient was downstairs outside (she was picking up her daughters medication).  She stated that she has an appointment with you tomorrow for covid symptoms.  Her daughter is positive and husband is too.  She has a cough and is unable to sleep.  She would like to see if you can send her in some promethazine-codeine in?  She has an appt tonight to get covid test done at [facility] in [place].

## 2021-03-27 ENCOUNTER — Encounter: Payer: 59 | Admitting: Family Medicine

## 2021-03-27 ENCOUNTER — Telehealth (INDEPENDENT_AMBULATORY_CARE_PROVIDER_SITE_OTHER): Payer: 59 | Admitting: Family Medicine

## 2021-03-27 ENCOUNTER — Telehealth: Payer: Self-pay

## 2021-03-27 ENCOUNTER — Other Ambulatory Visit (HOSPITAL_BASED_OUTPATIENT_CLINIC_OR_DEPARTMENT_OTHER): Payer: Self-pay

## 2021-03-27 ENCOUNTER — Other Ambulatory Visit: Payer: Self-pay

## 2021-03-27 ENCOUNTER — Encounter: Payer: Self-pay | Admitting: Family Medicine

## 2021-03-27 VITALS — BP 185/70 | Temp 101.0°F

## 2021-03-27 DIAGNOSIS — J40 Bronchitis, not specified as acute or chronic: Secondary | ICD-10-CM | POA: Insufficient documentation

## 2021-03-27 DIAGNOSIS — U071 COVID-19: Secondary | ICD-10-CM

## 2021-03-27 MED ORDER — BECLOMETHASONE DIPROP HFA 40 MCG/ACT IN AERB
2.0000 | INHALATION_SPRAY | Freq: Two times a day (BID) | RESPIRATORY_TRACT | 1 refills | Status: DC
  Filled 2021-03-27: qty 10.6, 30d supply, fill #0

## 2021-03-27 MED ORDER — MOLNUPIRAVIR EUA 200MG CAPSULE
4.0000 | ORAL_CAPSULE | Freq: Two times a day (BID) | ORAL | 0 refills | Status: AC
  Filled 2021-03-27: qty 40, 5d supply, fill #0

## 2021-03-27 MED ORDER — ALBUTEROL SULFATE HFA 108 (90 BASE) MCG/ACT IN AERS
2.0000 | INHALATION_SPRAY | Freq: Four times a day (QID) | RESPIRATORY_TRACT | 0 refills | Status: DC | PRN
  Filled 2021-03-27: qty 18, 25d supply, fill #0

## 2021-03-27 NOTE — Patient Instructions (Signed)
COVID-19: Quarantine and Isolation Quarantine If you were exposed Quarantine and stay away from others when you have been in close contact with someone who has COVID-19. Isolate If you are sick or test positive Isolate when you are sick or when you have COVID-19, even if you don't have symptoms. When to stay home Calculating quarantine The date of your exposure is considered day 0. Day 1 is the first full day after your last contact with a person who has had COVID-19. Stay home and away from other people for at least 5 days. Learn why CDC updated guidance for the general public. IF YOU were exposed to COVID-19 and are NOT  up to dateIF YOU were exposed to COVID-19 and are NOT on COVID-19 vaccinations Quarantine for at least 5 days Stay home Stay home and quarantine for at least 5 full days. Wear a well-fitting mask if you must be around others in your home. Do not travel. Get tested Even if you don't develop symptoms, get tested at least 5 days after you last had close contact with someone with COVID-19. After quarantine Watch for symptoms Watch for symptoms until 10 days after you last had close contact with someone with COVID-19. Avoid travel It is best to avoid travel until a full 10 days after you last had close contact with someone with COVID-19. If you develop symptoms Isolate immediately and get tested. Continue to stay home until you know the results. Wear a well-fitting mask around others. Take precautions until day 10 Wear a well-fitting mask Wear a well-fitting mask for 10 full days any time you are around others inside your home or in public. Do not go to places where you are unable to wear a well-fitting mask. If you must travel during days 6-10, take precautions. Avoid being around people who are more likely to get very sick from COVID-19. IF YOU were exposed to COVID-19 and are  up to dateIF YOU were exposed to COVID-19 and are on COVID-19 vaccinations No  quarantine You do not need to stay home unless you develop symptoms. Get tested Even if you don't develop symptoms, get tested at least 5 days after you last had close contact with someone with COVID-19. Watch for symptoms Watch for symptoms until 10 days after you last had close contact with someone with COVID-19. If you develop symptoms Isolate immediately and get tested. Continue to stay home until you know the results. Wear a well-fitting mask around others. Take precautions until day 10 Wear a well-fitting mask Wear a well-fitting mask for 10 full days any time you are around others inside your home or in public. Do not go to places where you are unable to wear a well-fitting mask. Take precautions if traveling Avoid being around people who are more likely to get very sick from COVID-19. IF YOU were exposed to COVID-19 and had confirmed COVID-19 within the past 90 days (you tested positive using a viral test) No quarantine You do not need to stay home unless you develop symptoms. Watch for symptoms Watch for symptoms until 10 days after you last had close contact with someone with COVID-19. If you develop symptoms Isolate immediately and get tested. Continue to stay home until you know the results. Wear a well-fitting mask around others. Take precautions until day 10 Wear a well-fitting mask Wear a well-fitting mask for 10 full days any time you are around others inside your home or in public. Do not go to places where you are  unable to wear a well-fitting mask. Take precautions if traveling Avoid being around people who are more likely to get very sick from COVID-19. Calculating isolation Day 0 is your first day of symptoms or a positive viral test. Day 1 is the first full day after your symptoms developed or your test specimen was collected. If you have COVID-19 or have symptoms, isolate for at least 5 days. IF YOU tested positive for COVID-19 or have symptoms, regardless of  vaccination status Stay home for at least 5 days Stay home for 5 days and isolate from others in your home. Wear a well-fitting mask if you must be around others in your home. Do not travel. Ending isolation if you had symptoms End isolation after 5 full days if you are fever-free for 24 hours (without the use of fever-reducing medication) and your symptoms are improving. Ending isolation if you did NOT have symptoms End isolation after at least 5 full days after your positive test. If you got very sick from COVID-19 or have a weakened immune system You should isolate for at least 10 days. Consult your doctor before ending isolation. Take precautions until day 10 Wear a well-fitting mask Wear a well-fitting mask for 10 full days any time you are around others inside your home or in public. Do not go to places where you are unable to wear a well-fitting mask. Do not travel Do not travel until a full 10 days after your symptoms started or the date your positive test was taken if you had no symptoms. Avoid being around people who are more likely to get very sick from COVID-19. Definitions Exposure Contact with someone infected with SARS-CoV-2, the virus that causes COVID-19, in a way that increases the likelihood of getting infected with the virus. Close contact A close contact is someone who was less than 6 feet away from an infected person (laboratory-confirmed or a clinical diagnosis) for a cumulative total of 15 minutes or more over a 24-hour period. For example, three individual 5-minute exposures for a total of 15 minutes. People who are exposed to someone with COVID-19 after they completed at least 5 days of isolation are not considered close contacts. [name] is a strategy used to prevent transmission of COVID-19 by keeping people who have been in close contact with someone with COVID-19 apart from others. Who does not need to quarantine? If you had close contact with  someone with COVID-19 and you are in one of the following groups, you do not need to quarantine. You are up to date with your COVID-19 vaccines. You had confirmed COVID-19 within the last 90 days (meaning you tested positive using a viral test). If you are up to date with COVID-19 vaccines, you should wear a well-fitting mask around others for 10 days from the date of your last close contact with someone with COVID-19 (the date of last close contact is considered day 0). Get tested at least 5 days after you last had close contact with someone with COVID-19. If you test positive or develop COVID-19 symptoms, isolate from other people and follow recommendations in the Isolation section below. If you tested positive for COVID-19 with a viral test within the previous 90 days and subsequently recovered and remain without COVID-19 symptoms, you do not need to quarantine or get tested after close contact. You should wear a well-fitting mask around others for 10 days from the date of your last close contact with someone with COVID-19 (the date of last  close contact is considered day 0). If you have COVID-19 symptoms, get tested and isolate from other people and follow recommendations in the Isolation section below. Who should quarantine? If you come into close contact with someone with COVID-19, you should quarantine if you are not up to date on COVID-19 vaccines. This includes people who are not vaccinated. What to do for quarantine Stay home and away from other people for at least 5 days (day 0 through day 5) after your last contact with a person who has COVID-19. The date of your exposure is considered day 0. Wear a well-fitting mask when around others at home, if possible. For 10 days after your last close contact with someone with COVID-19, watch for fever (100.38F or greater), cough, shortness of breath, or other COVID-19 symptoms. If you develop symptoms, get tested immediately and isolate until you receive  your test results. If you test positive, follow isolation recommendations. If you do not develop symptoms, get tested at least 5 days after you last had close contact with someone with COVID-19. If you test negative, you can leave your home, but continue to wear a well-fitting mask when around others at home and in public until 10 days after your last close contact with someone with COVID-19. If you test positive, you should isolate for at least 5 days from the date of your positive test (if you do not have symptoms). If you do develop COVID-19 symptoms, isolate for at least 5 days from the date your symptoms began (the date the symptoms started is day 0). Follow recommendations in the isolation section below. If you are unable to get a test 5 days after last close contact with someone with COVID-19, you can leave your home after day 5 if you have been without COVID-19 symptoms throughout the 5-day period. Wear a well-fitting mask for 10 days after your date of last close contact when around others at home and in public. Avoid people who are have weakened immune systems or are more likely to get very sick from COVID-19, and nursing homes and other high-risk settings, until after at least 10 days. If possible, stay away from people you live with, especially people who are at higher risk for getting very sick from COVID-19, as well as others outside your home throughout the full 10 days after your last close contact with someone with COVID-19. If you are unable to quarantine, you should wear a well-fitting mask for 10 days when around others at home and in public. If you are unable to wear a mask when around others, you should continue to quarantine for 10 days. Avoid people who have weakened immune systems or are more likely to get very sick from COVID-19, and nursing homes and other high-risk settings, until after at least 10 days. See additional information about travel. Do not go to places where you are  unable to wear a mask, such as restaurants and some gyms, and avoid eating around others at home and at work until after 10 days after your last close contact with someone with COVID-19. After quarantine Watch for symptoms until 10 days after your last close contact with someone with COVID-19. If you have symptoms, isolate immediately and get tested. Quarantine in high-risk congregate settings In certain congregate settings that have high risk of secondary transmission (such as Systems analyst and detention facilities, homeless shelters, or cruise ships), CDC recommends a 10-day quarantine for residents, regardless of vaccination and booster status. During periods of critical staffing  shortages, facilities may consider shortening the quarantine period for staff to ensure continuity of operations. Decisions to shorten quarantine in these settings should be made in consultation with state, local, tribal, or territorial health departments and should take into consideration the context and characteristics of the facility. CDC's setting-specific guidance provides additional recommendations for these settings. Isolation Isolation is used to separate people with confirmed or suspected COVID-19 from those without COVID-19. People who are in isolation should stay home until it's safe for them to be around others. At home, anyone sick or infected should separate from others, or wear a well-fitting mask when they need to be around others. People in isolation should stay in a specific "sick room" or area and use a separate bathroom if available. Everyone who has presumed or confirmed COVID-19 should stay home and isolate from other people for at least 5 full days (day 0 is the first day of symptoms or the date of the day of the positive viral test for asymptomatic persons). They should wear a mask when around others at home and in public for an additional 5 days. People who are confirmed to have COVID-19 or are showing  symptoms of COVID-19 need to isolate regardless of their vaccination status. This includes: People who have a positive viral test for COVID-19, regardless of whether or not they have symptoms. People with symptoms of COVID-19, including people who are awaiting test results or have not been tested. People with symptoms should isolate even if they do not know if they have been in close contact with someone with COVID-19. What to do for isolation Monitor your symptoms. If you have an emergency warning sign (including trouble breathing), seek emergency medical care immediately. Stay in a separate room from other household members, if possible. Use a separate bathroom, if possible. Take steps to improve ventilation at home, if possible. Avoid contact with other members of the household and pets. Don't share personal household items, like cups, towels, and utensils. Wear a well-fitting mask when you need to be around other people. Learn more about what to do if you are sick and how to notify your contacts. Ending isolation for people who had COVID-19 and had symptoms If you had COVID-19 and had symptoms, isolate for at least 5 days. To calculate your 5-day isolation period, day 0 is your first day of symptoms. Day 1 is the first full day after your symptoms developed. You can leave isolation after 5 full days. You can end isolation after 5 full days if you are fever-free for 24 hours without the use of fever-reducing medication and your other symptoms have improved (Loss of taste and smell may persist for weeks or months after recovery and need not delay the end of isolation). You should continue to wear a well-fitting mask around others at home and in public for 5 additional days (day 6 through day 10) after the end of your 5-day isolation period. If you are unable to wear a mask when around others, you should continue to isolate for a full 10 days. Avoid people who have weakened immune systems or are more  likely to get very sick from COVID-19, and nursing homes and other high-risk settings, until after at least 10 days. If you continue to have fever or your other symptoms have not improved after 5 days of isolation, you should wait to end your isolation until you are fever-free for 24 hours without the use of fever-reducing medication and your other symptoms have improved.  Continue to wear a well-fitting mask through day 10. Contact your healthcare provider if you have questions. See additional information about travel. Do not go to places where you are unable to wear a mask, such as restaurants and some gyms, and avoid eating around others at home and at work until a full 10 days after your first day of symptoms. If an individual has access to a test and wants to test, the best approach is to use an antigen test1 towards the end of the 5-day isolation period. Collect the test sample only if you are fever-free for 24 hours without the use of fever-reducing medication and your other symptoms have improved (loss of taste and smell may persist for weeks or months after recovery and need not delay the end of isolation). If your test result is positive, you should continue to isolate until day 10. If your test result is negative, you can end isolation, but continue to wear a well-fitting mask around others at home and in public until day 10. Follow additional recommendations for masking and avoiding travel as described above. 1As noted in the labeling for authorized over-the counter antigen tests: Negative results should be treated as presumptive. Negative results do not rule out SARS-CoV-2 infection and should not be used as the sole basis for treatment or patient management decisions, including infection control decisions. To improve results, antigen tests should be used twice over a three-day period with at least 24 hours and no more than 48 hours between tests. Note that these recommendations on ending isolation  do not apply to people who are moderately ill or very sick from COVID-19 or have weakened immune systems. See section below for recommendations for when to end isolation for these groups. Ending isolation for people who tested positive for COVID-19 but had no symptoms If you test positive for COVID-19 and never develop symptoms, isolate for at least 5 days. Day 0 is the day of your positive viral test (based on the date you were tested) and day 1 is the first full day after the specimen was collected for your positive test. You can leave isolation after 5 full days. If you continue to have no symptoms, you can end isolation after at least 5 days. You should continue to wear a well-fitting mask around others at home and in public until day 10 (day 6 through day 10). If you are unable to wear a mask when around others, you should continue to isolate for 10 days. Avoid people who have weakened immune systems or are more likely to get very sick from COVID-19, and nursing homes and other high-risk settings, until after at least 10 days. If you develop symptoms after testing positive, your 5-day isolation period should start over. Day 0 is your first day of symptoms. Follow the recommendations above for ending isolation for people who had COVID-19 and had symptoms. See additional information about travel. Do not go to places where you are unable to wear a mask, such as restaurants and some gyms, and avoid eating around others at home and at work until 10 days after the day of your positive test. If an individual has access to a test and wants to test, the best approach is to use an antigen test1 towards the end of the 5-day isolation period. If your test result is positive, you should continue to isolate until day 10. If your test result is positive, you can also choose to test daily and if your test result  is negative, you can end isolation, but continue to wear a well-fitting mask around others at home and in  public until day 10. Follow additional recommendations for masking and avoiding travel as described above. 1As noted in the labeling for authorized over-the counter antigen tests: Negative results should be treated as presumptive. Negative results do not rule out SARS-CoV-2 infection and should not be used as the sole basis for treatment or patient management decisions, including infection control decisions. To improve results, antigen tests should be used twice over a three-day period with at least 24 hours and no more than 48 hours between tests. Ending isolation for people who were moderately or very sick from COVID-19 or have a weakened immune system People who are moderately ill from COVID-19 (experiencing symptoms that affect the lungs like shortness of breath or difficulty breathing) should isolate for 10 days and follow all other isolation precautions. To calculate your 10-day isolation period, day 0 is your first day of symptoms. Day 1 is the first full day after your symptoms developed. If you are unsure if your symptoms are moderate, talk to a healthcare provider for further guidance. People who are very sick from COVID-19 (this means people who were hospitalized or required intensive care or ventilation support) and people who have weakened immune systems might need to isolate at home longer. They may also require testing with a viral test to determine when they can be around others. CDC recommends an isolation period of at least 10 and up to 20 days for people who were very sick from COVID-19 and for people with weakened immune systems. Consult with your healthcare provider about when you can resume being around other people. If you are unsure if your symptoms are severe or if you have a weakened immune system, talk to a healthcare provider for further guidance. People who have a weakened immune system should talk to their healthcare provider about the potential for reduced immune responses to  COVID-19 vaccines and the need to continue to follow current prevention measures (including wearing a well-fitting mask and avoiding crowds and poorly ventilated indoor spaces) to protect themselves against COVID-19 until advised otherwise by their healthcare provider. Close contacts of immunocompromised people--including household members--should also be encouraged to receive all recommended COVID-19 vaccine doses to help protect these people. Isolation in high-risk congregate settings In certain high-risk congregate settings that have high risk of secondary transmission and where it is not feasible to cohort people (such as Systems analyst and detention facilities, homeless shelters, and cruise ships), CDC recommends a 10-day isolation period for residents. During periods of critical staffing shortages, facilities may consider shortening the isolation period for staff to ensure continuity of operations. Decisions to shorten isolation in these settings should be made in consultation with state, local, tribal, or territorial health departments and should take into consideration the context and characteristics of the facility. CDC's setting-specific guidance provides additional recommendations for these settings. This CDC guidance is meant to supplement--not replace--any federal, state, local, territorial, or tribal health and safety laws, rules, and regulations. Recommendations for specific settings These recommendations do not apply to healthcare professionals. For guidance specific to these settings, see Healthcare professionals: Interim Guidance for Optician, dispensing with SARS-CoV-2 Infection or Exposure to SARS-CoV-2 Patients, residents, and visitors to healthcare settings: Interim Infection Prevention and Control Recommendations for Healthcare Personnel During the [place] 2019 (COVID-19) Pandemic Additional setting-specific guidance and recommendations are available. These  recommendations on quarantine and isolation do apply to [place]  settings. Additional guidance is available here: Overview of COVID-19 Quarantine for K-12 Schools Travelers: Travel information and recommendations Congregate facilities and other settings: [employer] for community, work, and school settings Ongoing COVID-19 exposure FAQs I live with someone with COVID-19, but I cannot be separated from them. How do we manage quarantine in this situation? It is very important for people with COVID-19 to remain apart from other people, if possible, even if they are living together. If separation of the person with COVID-19 from others that they live with is not possible, the other people that they live with will have ongoing exposure, meaning they will be repeatedly exposed until that person is no longer able to spread the virus to other people. In this situation, there are precautions you can take to limit the spread of COVID-19: The person with COVID-19 and everyone they live with should wear a well-fitting mask inside the home. If possible, one person should care for the person with COVID-19 to limit the number of people who are in close contact with the infected person. Take steps to protect yourself and others to reduce transmission in the home: Quarantine if you are not up to date with your COVID-19 vaccines. Isolate if you are sick or tested positive for COVID-19, even if you don't have symptoms. Learn more about the public health recommendations for testing, mask use and quarantine of close contacts, like yourself, who have ongoing exposure. These recommendations differ depending on your vaccination status. What should I do if I have ongoing exposure to COVID-19 from someone I live with? Recommendations for this situation depend on your vaccination status: If you are not up to date on COVID-19 vaccines and have ongoing exposure to COVID-19, you should: Begin quarantine immediately and  continue to quarantine throughout the isolation period of the person with COVID-19. Continue to quarantine for an additional 5 days starting the day after the end of isolation for the person with COVID-19. Get tested at least 5 days after the end of isolation of the infected person that lives with them. If you test negative, you can leave the home but should continue to wear a well-fitting mask when around others at home and in public until 10 days after the end of isolation for the person with COVID-19. Isolate immediately if you develop symptoms of COVID-19 or test positive. If you are up to date with COVID-19 vaccines and have ongoing exposure to COVID-19, you should: Get tested at least 5 days after your first exposure. A person with COVID-19 is considered infectious starting 2 days before they develop symptoms, or 2 days before the date of their positive test if they do not have symptoms. Get tested again at least 5 days after the end of isolation for the person with COVID-19. Wear a well-fitting mask when you are around the person with COVID-19, and do this throughout their isolation period. Wear a well-fitting mask around others for 10 days after the infected person's isolation period ends. Isolate immediately if you develop symptoms of COVID-19 or test positive. What should I do if multiple people I live with test positive for COVID-19 at different times? Recommendations for this situation depend on your vaccination status: If you are not up to date with your COVID-19 vaccines, you should: Quarantine throughout the isolation period of any infected person that you live with. Continue to quarantine until 5 days after the end of isolation date for the most recently infected person that lives with you. For example, if  the last day of isolation of the person most recently infected with COVID-19 was June 30, the new 5-day quarantine period starts on July 1. Get tested at least 5 days after the end  of isolation for the most recently infected person that lives with you. Wear a well-fitting mask when you are around any person with COVID-19 while that person is in isolation. Wear a well-fitting mask when you are around other people until 10 days after your last close contact. Isolate immediately if you develop symptoms of COVID-19 or test positive. If you are up to date with your COVID-19 vaccines, you should: Get tested at least 5 days after your first exposure. A person with COVID-19 is considered infectious starting 2 days before they developed symptoms, or 2 days before the date of their positive test if they do not have symptoms. Get tested again at least 5 days after the end of isolation for the most recently infected person that lives with you. Wear a well-fitting mask when you are around any person with COVID-19 while that person is in isolation. Wear a well-fitting mask around others for 10 days after the end of isolation for the most recently infected person that lives with you. For example, if the last day of isolation for the person most recently infected with COVID-19 was June 30, the new 10-day period to wear a well-fitting mask indoors in public starts on July 1. Isolate immediately if you develop symptoms of COVID-19 or test positive. I had COVID-19 and completed isolation. Do I have to quarantine or get tested if someone I live with gets COVID-19 shortly after I completed isolation? No. If you recently completed isolation and someone that lives with you tests positive for the virus that causes COVID-19 shortly after the end of your isolation period, you do not have to quarantine or get tested as long as you do not develop new symptoms. Once all of the people that live together have completed isolation or quarantine, refer to the guidance below for new exposures to COVID-19. If you had COVID-19 in the previous 90 days and then came into close contact with someone with COVID-19, you do  not have to quarantine or get tested if you do not have symptoms. But you should: Wear a well-fitting mask indoors in public for 10 days after your last close contact. Monitor for COVID-19 symptoms for 10 days from the date of your last close contact. Isolate immediately and get tested if symptoms develop. If more than 90 days have passed since your recovery from infection, follow CDC's recommendations for close contacts. These recommendations will differ depending on your vaccination status. 10/08/2020 Content source: [facility] for Immunization and Respiratory Diseases ([facility]), Division of Viral Diseases This information is not intended to replace advice given to you by your health care provider. Make sure you discuss any questions you have with your health care provider. Document Revised: 11/13/2020 Document Reviewed: 11/13/2020 Elsevier Patient Education  [place].

## 2021-03-27 NOTE — Telephone Encounter (Signed)
Keith Rake Key: [id number] - PA Case ID: [id number] - Rx #: [id number]

## 2021-03-27 NOTE — Assessment & Plan Note (Signed)
Antiviral per orders Cough med sent in yesterday Albuterol / qvar Call back or rto prn Quarantine 5 days --- may leave house after 5 days with mask on for 5 more days as long as symptoms are improving

## 2021-03-27 NOTE — Telephone Encounter (Signed)
Pt seen by PCP today.  

## 2021-03-27 NOTE — Progress Notes (Signed)
MyChart Video Visit    Virtual Visit via Video Note   This visit type was conducted due to national recommendations for restrictions regarding the COVID-19 Pandemic (e.g. social distancing) in an effort to limit this patient's exposure and mitigate transmission in our community. This patient is at least at moderate risk for complications without adequate follow up. This format is felt to be most appropriate for this patient at this time. Physical exam was limited by quality of the video and audio technology used for the visit. [name] was able to get the patient set up on a video visit.  Patient location: Home Patient and provider in visit Provider location: Office  I discussed the limitations of evaluation and management by telemedicine and the availability of in person appointments. The patient expressed understanding and agreed to proceed.  Visit Date:03/27/2021  Today's healthcare provider: [name], DO      Subjective:    Patient ID: [name], female    DOB: 25-Aug-1955, 65 y.o.   MRN: [id number]  Chief Complaint  Patient presents with   COVID Sxs    HPI Patient is in today for a video visit.  She has been experiencing Covid-19 symptoms and tested positive yesterday. Her husband started experiencing symptoms on Sunday evening after he came back from work and he tested positive on Monday. She started experiencing a sore throat, sneezing, ear pain and cough on Wednesday. She reports wheezing in the night and mentions she woke up with a high fever this morning. She has been reluctant to use tylenol because she thinks it will give her stomach problems. She is using ibuprofen and phenergan. Her sugar levels have been as high as 112 recently.  She also reports she is having back surgery on the 28 th of November and has been using injections to stabilize her blood pressure. They have been ranging from the 160's to the 190's. This morning, it was  185/77. She has been trying to manage her blood pressure with benicar and hydralazine but has seen no changes. She is asking for medication to help with weight loss since she has been unable to exercise due to her back pain and has gained some weight.  Past Medical History:  Diagnosis Date   Allergic rhinitis    Anxiety    Asthma    hx bronchial asthma at times with upper resp infection   Depression    Fibromyalgia    GERD (gastroesophageal reflux disease)    Headache(784.0)    migraine and cluster headaches   Hyperlipemia    Hypertension    IBS (irritable bowel syndrome)    Menopause    Neuromuscular disorder ([place]) 2009   hx of fibromyalgia, polyarthralsia (surgery induced)   Osteoarthritis    Rheumatoid arthritis ([place])    Complicated by osteoarthritis as well.   Rheumatoid arthritis ([place])    Stress incontinence    at times   Tibial fracture 10/14/2016   evulsion periostial right   Type 2 diabetes mellitus with complication, without long-term current use of insulin (HCC)    diet controlled no meds -> however was diagnosed with a diabetic foot ulcer.    Past Surgical History:  Procedure Laterality Date   ABDOMINAL HYSTERECTOMY  12/1999   complete   CARPAL TUNNEL RELEASE Right 12/27/2017   Procedure: CARPAL TUNNEL RELEASE;  Surgeon: Roseanne Kaufman, MD;  Location: [facility];  Service: Orthopedics;  Laterality: Right;   CERVICAL CONIZATION W/BX  june 1990  dysplasia of cervix/used 5Fu cream for 3 months   CERVICAL LAMINECTOMY  2005 & 2009   X 2   NO ROM [place]   X 2   CHOLECYSTECTOMY  1986   DILATION AND CURETTAGE OF UTERUS   april-1989   missed abortion   ESOPHAGOGASTRODUODENOSCOPY  06/29/2011   Procedure: ESOPHAGOGASTRODUODENOSCOPY (EGD);  Surgeon: Shann Medal, MD;  Location: [facility];  Service: General;  Laterality: N/A;   FOOT SURGERY  2005   RT HEEL   GASTRIC BANDING PORT REVISION  09/24/2011   Procedure: GASTRIC BANDING PORT  REVISION;  Surgeon: Pedro Earls, MD;  Location: [facility];  Service: General;  Laterality: N/A;   GASTRIC ROUX-EN-Y N/A 06/06/2017   Procedure: Conversion from [place]leeve to [place];  Surgeon: Johnathan Hausen, MD;  Location: [facility];  Service: General;  Laterality: N/A;   [place]  12/30/09   LAPAROSCOPIC GASTRIC SLEEVE RESECTION N/A 07/02/2013   Procedure: LAPAROSCOPIC GASTRIC SLEEVE RESECTION upper endoscopy;  Surgeon: Pedro Earls, MD;  Location: [facility];  Service: General;  Laterality: N/A;   LAPAROSCOPIC REPAIR AND REMOVAL OF GASTRIC BAND N/A 07/02/2013   Procedure: LAPAROSCOPIC REMOVAL OF GASTRIC BAND;  Surgeon: Pedro Earls, MD;  [facility];  Service: General;  Laterality: N/A;   LAPAROSCOPIC REVISION OF GASTRIC BAND  07/03/2012   Procedure: LAPAROSCOPIC REVISION OF GASTRIC BAND;  Surgeon: Pedro Earls, MD;  Location: [facility];  Service: General;  Laterality: N/A;  removal of old lap. band port, replaced with AP standard band   LAPAROSCOPY  09/24/2011   Procedure: LAPAROSCOPY DIAGNOSTIC;  Surgeon: Pedro Earls, MD;  Location: [facility];  Service: General;  Laterality: N/A;   LAPAROSCOPY  07/03/2012   Procedure: LAPAROSCOPY DIAGNOSTIC;  Surgeon: Pedro Earls, MD;  Location: [facility];  Service: General;  Laterality: N/A;  Exploratory Laparoscopy    MESH APPLIED TO LAP PORT  07/03/2012   Procedure: MESH APPLIED TO LAP PORT;  Surgeon: Pedro Earls, MD;  Location: [facility];  Service: General;  Laterality: N/A;   [place]   X 2   right knee  1981   ARTHROSCOPY AND ARTHROTOMY   right knee arthroscopy and arthrotomy  12-1979   Divide   WITH C -SECTION    Family History  Problem Relation Age of Onset   Diabetes Mother    Stroke Mother    Breast cancer Mother    Atrial fibrillation Mother    Hypertension Mother    Cancer  Mother 72       breast   Diabetes Father    Stroke Father 60       2nd 22 month apart   Hypertension Father    Heart attack Father 46       stents   Dementia Father    AAA (abdominal aortic aneurysm) Father 12       repair   CAD Father 62   Thyroid disease Other    Heart disease Other    COPD Other     Social History   Socioeconomic History   Marital status: Married    Spouse name: [name]   Number of children: 2   Years of education: Not on file   Highest education level: Not on file  Occupational History  Occupation: Programmer, multimedia: [employer]  Tobacco Use   Smoking status: Never   Smokeless tobacco: Never  Vaping Use   Vaping Use: Never used  Substance and Sexual Activity   Alcohol use: Not Currently   Drug use: Never   Sexual activity: Yes    Birth control/protection: None  Other Topics Concern   Not on file  Social History Narrative   Right handed   Two story home   Drinks caffeine occasionally   Social Determinants of Health   Financial Resource Strain: Not on file  Food Insecurity: Not on file  Transportation Needs: Not on file  Physical Activity: Not on file  Stress: Not on file  Social Connections: Not on file  Intimate Partner Violence: Not on file    Outpatient Medications Prior to Visit  Medication Sig Dispense Refill   acetaminophen (TYLENOL) 650 MG CR tablet Take 1,300 mg by mouth as needed.     Ca Phosphate-Cholecalciferol (CALTRATE GUMMY BITES PO) Take 2 each 2 (two) times daily by mouth.      conjugated estrogens (PREMARIN) vaginal cream INSERT 1 GRAM OF CREAM PER VAGINA THREE TIMES A WEEK BY VAGINAL ROUTE. 30 g 5   COVID-19 At Home Antigen Test (CARESTART COVID-19 HOME TEST) KIT Use as directed per package instructions. 2 kit 0   cyclobenzaprine (FLEXERIL) 10 MG tablet TAKE ONE TABLET (10 MG DOSE) BY MOUTH AT BEDTIME AS NEEDED. 30 tablet 2   diclofenac Sodium (VOLTAREN) 1 % GEL Apply 2 g topically See admin  instructions.     DULoxetine (CYMBALTA) 30 MG capsule 30 mg 2 (two) times daily. 2 TABS BID     Efinaconazole (JUBLIA) 10 % SOLN Apply to affected toenails once daily for 48 weeks. 8 mL 11   estradiol (VIVELLE-DOT) 0.1 MG/24HR Place 1 patch (0.1 mg total) onto the skin 2 (two) times a week. Monday and Thursday 8 patch 11   furosemide (LASIX) 20 MG tablet TAKE 1 TABLET BY MOUTH EVERY MORNING 90 tablet 1   Galcanezumab-gnlm 120 MG/ML SOAJ INJECT 120 MG INTO THE SKIN EVERY 28 (TWENTY-EIGHT) DAYS. 1 mL 5   glucose blood test strip Test blood sugar once daily. Dx Code: E11.9 100 each 12   hydrALAZINE (APRESOLINE) 25 MG tablet Take 1 tablet (25 mg total) by mouth 2 (two) times daily. Hold if feeling dizzy or SBP<110 mmHg).  Can use additional 25 mg as needed for SBP>150 mmHg 180 tablet 3   HYDROcodone-acetaminophen (NORCO/VICODIN) 5-325 MG tablet Take 1 tablet by mouth every 6 (six) hours as needed.     hydroxychloroquine (PLAQUENIL) 200 MG tablet Take 2 tablets by mouth once daily 60 tablet 3   loratadine (CLARITIN) 10 MG tablet Take 10 mg by mouth every evening.     meclizine (ANTIVERT) 25 MG tablet Take 1 tablet (25 mg total) by mouth 3 (three) times daily as needed for dizziness. 20 tablet 0   methocarbamol (ROBAXIN) 500 MG tablet Take one tablet (500 mg dose) by mouth every 4 (four) hours as needed. 180 tablet 2   NONFORMULARY OR COMPOUNDED ITEM bp cuff  Dx hypertension 1 each 0   nystatin (MYCOSTATIN/NYSTOP) powder APPLY 1 APPLICATION TOPICALLY 3 (THREE) TIMES DAILY. 15 g 5   olmesartan (BENICAR) 20 MG tablet Take 1 tablet (20 mg total) by mouth daily with supper. 90 tablet 3   pantoprazole (PROTONIX) 40 MG tablet Take 1 tablet (40 mg total) by mouth daily. 90 tablet 3  Pediatric Multivit-Minerals-C (ONE-A-DAY SCOOBY-DOO GUMMIES PO) Take 1 tablet 2 (two) times daily by mouth.      potassium chloride (KLOR-CON) 10 MEQ tablet TAKE 1 TABLET (10 MEQ TOTAL) BY MOUTH DAILY. 90 tablet 1    promethazine-codeine (PHENERGAN WITH CODEINE) 6.25-10 MG/5ML syrup Take 5 mLs by mouth every 6 (six) hours as needed for cough. 120 mL 0   rosuvastatin (CRESTOR) 20 MG tablet TAKE 1 TABLET BY MOUTH ONCE DAILY 90 tablet 2   temazepam (RESTORIL) 30 MG capsule TAKE 1 CAPSULE (30 MG TOTAL) BY MOUTH AT BEDTIME AS NEEDED FOR SLEEP. 90 capsule 3   thiamine (VITAMIN B-1) 100 MG tablet Take 100 mg daily by mouth.     tocilizumab (ACTEMRA) 400 MG/20ML SOLN injection Inject 400 mg into the vein every 30 (thirty) days.     traMADol (ULTRAM) 50 MG tablet Take 1 tablet (50 mg total) by mouth every 8 (eight) hours as needed. 90 tablet 1   Ubrogepant (UBRELVY) 100 MG TABS Take 100 mg by mouth as needed (as needed for migraine. MAX 2 tabs in 24 hours). 16 tablet 2   gabapentin (NEURONTIN) 300 MG capsule Take by mouth.     leflunomide (ARAVA) 20 MG tablet Take 1 tablet by mouth daily.     No facility-administered medications prior to visit.    Allergies  Allergen Reactions   Isoniazid     Severe SOB   Morphine Other (See Comments), Nausea And Vomiting and Hives    severe vomiting severe vomiting  Other reaction(s): Vomiting    Nitrofurantoin Shortness Of Breath, Other (See Comments) and Rash    REACTION: welts REACTION: welts   Oxycodone Hcl Hives and Itching   Hctz [Hydrochlorothiazide]     rash   Oseltamivir Phosphate Nausea And Vomiting    "I vomited within 30 minutes of taking it and was told I cannot ever take it again."   Promethazine Hcl Other (See Comments)    IF GIVEN  IV-hallucinations     CAN TAKE PO PHENERGAN   Sulfa Antibiotics Rash   Tamiflu [Oseltamivir Phosphate] Nausea And Vomiting    "I vomited within 30 minutes of taking it and was told I cannot ever take it again."   Wound Dressing Adhesive Rash    Adhesive Tape    Gabapentin     Pt states she has eye swelling    Macrolides And Ketolides     rash   Other Other (See Comments)    bandaids   Percocet  [Oxycodone-Acetaminophen]     REACTION: severe itching. Can take by mouth codeine   Roxicet [Oxycodone-Acetaminophen]     itching   Sulfamethoxazole-Trimethoprim     Septra / Bactrim  --REACTION: rash   Tamiflu [Oseltamivir]     Other reaction(s): Unknown   Fentanyl Hives and Rash    REACTION: welts    Review of Systems  Constitutional:  Positive for fever. Negative for chills.  HENT:  Positive for congestion, ear pain and sore throat.   Respiratory:  Positive for cough and wheezing. Negative for sputum production.   Musculoskeletal:  Positive for back pain.      Objective:    Physical Exam Vitals and nursing note reviewed.  Constitutional:      Appearance: She is ill-appearing.  Pulmonary:     Effort: Pulmonary effort is normal.  Neurological:     Mental Status: She is alert.  Psychiatric:        Mood and Affect: Mood normal.  Behavior: Behavior normal.        Thought Content: Thought content normal.        Judgment: Judgment normal.    BP (!) 185/70   Temp (!) 101 F (38.3 C)  Wt Readings from Last 3 Encounters:  02/27/21 214 lb (97.1 kg)  01/09/21 219 lb 6.4 oz (99.5 kg)  09/11/20 216 lb 3.2 oz (98.1 kg)    Diabetic Foot Exam - Simple   No data filed    Lab Results  Component Value Date   WBC 5.3 09/11/2020   HGB 11.3 (L) 09/11/2020   HCT 35.1 (L) 09/11/2020   PLT 312.0 09/11/2020   GLUCOSE 102 (H) 08/07/2020   CHOL 152 07/21/2020   TRIG 77.0 07/21/2020   HDL 82.80 07/21/2020   LDLCALC 54 07/21/2020   ALT 15 09/08/2020   AST 19 09/08/2020   NA 136 (A) 09/08/2020   K 4.3 09/08/2020   CL 100 09/08/2020   CREATININE 0.8 09/08/2020   BUN 16 09/08/2020   CO2 21 09/08/2020   TSH 1.85 09/08/2020   HGBA1C 6.1 07/21/2020   MICROALBUR 0.8 08/02/2019    Lab Results  Component Value Date   TSH 1.85 09/08/2020   Lab Results  Component Value Date   WBC 5.3 09/11/2020   HGB 11.3 (L) 09/11/2020   HCT 35.1 (L) 09/11/2020   MCV 82.8 09/11/2020    PLT 312.0 09/11/2020   Lab Results  Component Value Date   NA 136 (A) 09/08/2020   K 4.3 09/08/2020   CO2 21 09/08/2020   GLUCOSE 102 (H) 08/07/2020   BUN 16 09/08/2020   CREATININE 0.8 09/08/2020   BILITOT 0.4 08/07/2020   ALKPHOS 83 09/08/2020   AST 19 09/08/2020   ALT 15 09/08/2020   PROT 6.7 08/07/2020   ALBUMIN 3.8 09/08/2020   CALCIUM 8.8 09/08/2020   ANIONGAP 10 08/01/2020   GFR 68.91 08/07/2020   Lab Results  Component Value Date   CHOL 152 07/21/2020   Lab Results  Component Value Date   HDL 82.80 07/21/2020   Lab Results  Component Value Date   LDLCALC 54 07/21/2020   Lab Results  Component Value Date   TRIG 77.0 07/21/2020   Lab Results  Component Value Date   CHOLHDL 2 07/21/2020   Lab Results  Component Value Date   HGBA1C 6.1 07/21/2020       Assessment & Plan:   Problem List Items Addressed This Visit       Unprioritized   Bronchitis due to COVID-19 virus - Primary    Antiviral per orders Cough med sent in yesterday Albuterol / qvar Call back or rto prn Quarantine 5 days --- may leave house after 5 days with mask on for 5 more days as long as symptoms are improving       Relevant Medications   molnupiravir EUA (LAGEVRIO) 200 mg CAPS capsule   albuterol (VENTOLIN HFA) 108 (90 Base) MCG/ACT inhaler   beclomethasone (QVAR) 40 MCG/ACT inhaler   Other Relevant Orders   MyChart COVID-19 home monitoring program   Temperature monitoring    Meds ordered this encounter  Medications   molnupiravir EUA (LAGEVRIO) 200 mg CAPS capsule    Sig: Take 4 capsules (800 mg total) by mouth 2 (two) times daily for 5 days.    Dispense:  40 capsule    Refill:  0   albuterol (VENTOLIN HFA) 108 (90 Base) MCG/ACT inhaler    Sig: Inhale 2 puffs by  mouth into the lungs every 6 (six) hours as needed for wheezing or shortness of breath.    Dispense:  18 g    Refill:  0   beclomethasone (QVAR) 40 MCG/ACT inhaler    Sig: Inhale 2 puffs by mouth into the  lungs 2 (two) times daily.    Dispense:  10.6 g    Refill:  1    I discussed the assessment and treatment plan with the patient. The patient was provided an opportunity to ask questions and all were answered. The patient agreed with the plan and demonstrated an understanding of the instructions.   The patient was advised to call back or seek an in-person evaluation if the symptoms worsen or if the condition fails to improve as anticipated.  I provided 20 minutes of face-to-face time during this encounter.   I,Zite Okoli,acting as a Education administrator for [employer], DO.,have documented all relevant documentation on the behalf of [name], DO,as directed by  [name], DO while in the presence of [place], DO, have reviewed all documentation for this visit. The documentation on 03/27/21 for the exam, diagnosis, procedures, and orders are all accurate and complete.    [name], DO [place] [facility] [employer] [facility] [phone] (phone) [phone] (fax)  [place]

## 2021-03-30 ENCOUNTER — Other Ambulatory Visit: Payer: Self-pay | Admitting: Family Medicine

## 2021-03-30 ENCOUNTER — Other Ambulatory Visit (HOSPITAL_BASED_OUTPATIENT_CLINIC_OR_DEPARTMENT_OTHER): Payer: Self-pay

## 2021-03-30 MED ORDER — FLUTICASONE PROPIONATE HFA 110 MCG/ACT IN AERO
2.0000 | INHALATION_SPRAY | Freq: Two times a day (BID) | RESPIRATORY_TRACT | 12 refills | Status: DC
  Filled 2021-03-30: qty 12, 30d supply, fill #0

## 2021-03-30 NOTE — Telephone Encounter (Signed)
Denied: Approval requires you to try: Arnuity Ellipta and Flovent Diskus or Flovent HFA. This request has been denied because we do not have information showing you have tried the required step therapy medications.

## 2021-03-30 NOTE — Telephone Encounter (Signed)
My Chart message sent

## 2021-04-03 ENCOUNTER — Other Ambulatory Visit: Payer: Self-pay | Admitting: Family Medicine

## 2021-04-03 ENCOUNTER — Other Ambulatory Visit (HOSPITAL_BASED_OUTPATIENT_CLINIC_OR_DEPARTMENT_OTHER): Payer: Self-pay

## 2021-04-03 DIAGNOSIS — E785 Hyperlipidemia, unspecified: Secondary | ICD-10-CM

## 2021-04-03 MED ORDER — ROSUVASTATIN CALCIUM 20 MG PO TABS
ORAL_TABLET | Freq: Every day | ORAL | 1 refills | Status: DC
  Filled 2021-04-03: qty 90, 90d supply, fill #0

## 2021-04-06 NOTE — Telephone Encounter (Signed)
Complete

## 2021-04-07 ENCOUNTER — Encounter: Payer: Self-pay | Admitting: Family Medicine

## 2021-04-09 ENCOUNTER — Emergency Department
Admission: EM | Admit: 2021-04-09 | Discharge: 2021-04-09 | Disposition: A | Discharge status: Home / Self Care | Payer: 59 | Source: Home / Self Care

## 2021-04-09 ENCOUNTER — Encounter: Payer: Self-pay | Admitting: Emergency Medicine

## 2021-04-09 ENCOUNTER — Other Ambulatory Visit: Payer: Self-pay

## 2021-04-09 ENCOUNTER — Telehealth: Payer: Self-pay | Admitting: *Deleted

## 2021-04-09 ENCOUNTER — Emergency Department (INDEPENDENT_AMBULATORY_CARE_PROVIDER_SITE_OTHER): Payer: 59

## 2021-04-09 ENCOUNTER — Other Ambulatory Visit (HOSPITAL_BASED_OUTPATIENT_CLINIC_OR_DEPARTMENT_OTHER): Payer: Self-pay

## 2021-04-09 DIAGNOSIS — R509 Fever, unspecified: Secondary | ICD-10-CM

## 2021-04-09 DIAGNOSIS — R059 Cough, unspecified: Secondary | ICD-10-CM

## 2021-04-09 DIAGNOSIS — J309 Allergic rhinitis, unspecified: Secondary | ICD-10-CM

## 2021-04-09 MED ORDER — METHYLPREDNISOLONE 4 MG PO TBPK
ORAL_TABLET | ORAL | 0 refills | Status: DC
  Filled 2021-04-09: qty 21, 6d supply, fill #0

## 2021-04-09 MED ORDER — FEXOFENADINE HCL 180 MG PO TABS
180.0000 mg | ORAL_TABLET | Freq: Every day | ORAL | 0 refills | Status: DC
  Filled 2021-04-09: qty 30, 30d supply, fill #0

## 2021-04-09 NOTE — Telephone Encounter (Signed)
I called the pt.    "I'm leaving Urgent Care in [place] now".   They did a chart x ray.   It was fine I just have a lot of congestion I needed to get rid of.  I was given Allegra and dose pak medrol.   My husband works in the ED and I have RA so the virus has hit me hard.   I was exposed to him from where he had Covid.    I let her know to follow up with her PCP if she needed to and she said that's why she went to the urgent care because they were booked up for up to a month.   "I needed to see a dr now".     She thanked me for calling and was getting in the car to drive so we ended the call at this point.

## 2021-04-09 NOTE — ED Provider Notes (Signed)
[name]    CSN: [id number] Arrival date & time: 04/09/21  0803      History   Chief Complaint Chief Complaint  Patient presents with   Cough    Pt states she had covid x2 weeks ago. Was treated with antiviral and inhaler. Still having fatigue, cough, nasal congestion.     HPI [name] is a 65 y.o. female.   HPI 65 year old female presents with cough for 2 weeks.  Reports was diagnosed with COVID 19 on 03/26/2021 and was treated with antiviral Molnupiravir.  Reports PCP has given her Phenergan DM for cough.  PMH is significant for asthma (history of bronchial asthma, bronchitis due to COVID-19 virus, dyspnea with exertion, morbid obesity, allergic rhinitis, syncope, and dizziness.  Past Medical History:  Diagnosis Date   Allergic rhinitis    Anxiety    Asthma    hx bronchial asthma at times with upper resp infection   Depression    Fibromyalgia    GERD (gastroesophageal reflux disease)    Headache(784.0)    migraine and cluster headaches   Hyperlipemia    Hypertension    IBS (irritable bowel syndrome)    Menopause    Neuromuscular disorder ([place]) 2009   hx of fibromyalgia, polyarthralsia (surgery induced)   Osteoarthritis    Rheumatoid arthritis ([place])    Complicated by osteoarthritis as well.   Rheumatoid arthritis ([place])    Stress incontinence    at times   Tibial fracture 10/14/2016   evulsion periostial right   Type 2 diabetes mellitus with complication, without long-term current use of insulin (HCC)    diet controlled no meds -> however was diagnosed with a diabetic foot ulcer.    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   Spondylosis of cervical spine 10/07/2020   Lumbar radiculopathy 10/07/2020   Anemia 09/11/2020   Intractable migraine 08/26/2020   Inflammatory arthritis 07/22/2020    Thrombocytopenia ([place]) 07/22/2020   Chronic nonintractable headache 07/21/2020   Syncope 07/21/2020   Dizziness 07/21/2020   Cellulitis of right hand 07/21/2020   Lumbar spondylolysis 05/13/2020   Essential (primary) hypertension 04/02/2020   Menopausal symptom 03/11/2020   Stress 03/11/2020   Unspecified dyspareunia (CODE) 03/11/2020   Bilateral sacroiliitis ([place]) 03/04/2020   Senile calcific aortic valve sclerosis 02/08/2020   DOE (dyspnea on exertion) 02/08/2020   Murmur 01/07/2020   Acquired trigger finger of right ring finger 10/25/2019   Pain in right knee 08/24/2019   Rheumatoid arthritis involving multiple sites with positive rheumatoid factor ([place]) 08/02/2019   Primary osteoarthritis of both knees 08/02/2019   PTSD (post-traumatic stress disorder) 02/08/2019   Dog bite of arm, right, initial encounter 10/05/2018   Hyperlipidemia associated with type 2 diabetes mellitus ([place]) 08/24/2018   Symptomatic abdominal panniculus 12/12/2017   Carpal tunnel syndrome of left wrist 11/28/2017   Pain in right hand 11/08/2017   Cervical radiculopathy 10/06/2017   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   Keratosis 12/04/2014   Onychocryptosis 08/21/2014   Onychomycosis 05/21/2014   Fissured skin 01/22/2014   Fissure in skin of foot 12/18/2013   Porokeratosis 12/18/2013   Pain in lower limb 11/21/2013   Ingrown nail 07/20/2013   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 07/02/2013   Obesity (BMI 30-39.9) 06/25/2013   GERD (gastroesophageal reflux disease) 08/20/2011   Lapband APL +  [facility] repair June 2011-Major revision Dec 2013 06/29/2011   ABDOMINAL PAIN, ACUTE 08/01/2010   VITAMIN B12 DEFICIENCY 04/01/2010   BARIATRIC SURGERY STATUS 01/29/2010   ONYCHOMYCOSIS, BILATERAL 06/30/2009   STRESS INCONTINENCE 06/10/2009   ACUTE PHARYNGITIS 04/29/2009   COUGH 04/27/2009   BRONCHITIS, ACUTE 04/16/2009   NAUSEA 04/08/2009   DIARRHEA 04/08/2009   MORBID OBESITY 03/03/2009   SKIN  RASH 11/18/2008   UNSPECIFIED VITAMIN D DEFICIENCY 07/15/2008   OTHER SPECIFIED ANEMIAS 07/15/2008   Pain in joint, multiple sites 06/13/2008   Myalgia and myositis, unspecified 06/13/2008   BACK PAIN, LUMBAR 02/14/2008   ACUTE SINUSITIS, UNSPECIFIED 09/01/2007   CELLULITIS 08/12/2007   Depression with anxiety 06/09/2007   DEPRESSION 06/09/2007   Controlled type 2 diabetes mellitus with diabetic neuropathy, without long-term current use of insulin ([place]) 01/04/2007   Labile hypertension 01/04/2007   ALLERGIC RHINITIS 01/04/2007   HEADACHE 01/04/2007    Past Surgical History:  Procedure Laterality Date   ABDOMINAL HYSTERECTOMY  12/1999   complete   CARPAL TUNNEL RELEASE Right 12/27/2017   Procedure: CARPAL TUNNEL RELEASE;  Surgeon: Roseanne Kaufman, MD;  Location: [facility];  Service: Orthopedics;  Laterality: Right;   CERVICAL CONIZATION W/BX  june 1990   dysplasia of cervix/used 5Fu cream for 3 months   CERVICAL LAMINECTOMY  2005 & 2009   X 2   NO ROM [place]   X 2   CHOLECYSTECTOMY  1986   DILATION AND CURETTAGE OF UTERUS   [phone]   missed abortion   ESOPHAGOGASTRODUODENOSCOPY  06/29/2011   Procedure: ESOPHAGOGASTRODUODENOSCOPY (EGD);  Surgeon: Shann Medal, MD;  Location: [facility];  Service: General;  Laterality: N/A;   FOOT SURGERY  2005   RT HEEL   GASTRIC BANDING PORT REVISION  09/24/2011   Procedure: GASTRIC BANDING PORT REVISION;  Surgeon: Pedro Earls, MD;  Location: [facility];  Service: General;  Laterality: N/A;   GASTRIC ROUX-EN-Y N/A 06/06/2017   Procedure: Conversion from [place]leeve to [place];  Surgeon: Johnathan Hausen, MD;  Location: [facility];  Service: General;  Laterality: N/A;   [place]  12/30/09   LAPAROSCOPIC GASTRIC SLEEVE RESECTION N/A 07/02/2013   Procedure: LAPAROSCOPIC GASTRIC SLEEVE RESECTION upper endoscopy;  Surgeon: Pedro Earls, MD;  Location: [facility];  Service: General;  Laterality: N/A;   LAPAROSCOPIC REPAIR AND REMOVAL OF GASTRIC BAND N/A 07/02/2013   Procedure: LAPAROSCOPIC REMOVAL OF GASTRIC BAND;  Surgeon: Pedro Earls, MD;  Location: [facility];  Service: General;  Laterality: N/A;   LAPAROSCOPIC REVISION OF GASTRIC BAND  07/03/2012   Procedure: LAPAROSCOPIC REVISION OF GASTRIC BAND;  Surgeon: Pedro Earls, MD;  Location: [facility];  Service: General;  Laterality: N/A;  removal of old lap. band port, replaced with AP standard band   LAPAROSCOPY  09/24/2011   Procedure: LAPAROSCOPY DIAGNOSTIC;  Surgeon: Pedro Earls, MD;  Location: [facility];  Service: General;  Laterality: N/A;   LAPAROSCOPY  07/03/2012   Procedure: LAPAROSCOPY DIAGNOSTIC;  Surgeon: Pedro Earls, MD;  Location: [facility];  Service: General;  Laterality: N/A;  Exploratory Laparoscopy    MESH APPLIED TO LAP PORT  07/03/2012   Procedure: MESH APPLIED TO LAP PORT;  Surgeon: Pedro Earls, MD;  Location: [facility];  Service: General;  Laterality: N/A;   [place]   X 2  right knee  1981   ARTHROSCOPY AND ARTHROTOMY   right knee arthroscopy and arthrotomy  12-1979   TONSILLECTOMY  1971   TUBAL LIGATION  1993   WITH C -SECTION    OB History     Gravida  3   Para  0   Term  0   Preterm  0   AB  1   Living  2      SAB  1   IAB  0   Ectopic  0   Multiple  0   Live Births               Home Medications    Prior to Admission medications   Medication Sig Start Date End Date Taking? Authorizing Provider  fexofenadine (ALLEGRA ALLERGY) 180 MG tablet Take 1 tablet (180 mg total) by mouth daily for 15 days. 04/09/21 04/24/21 Yes Eliezer Lofts, FNP  methylPREDNISolone (MEDROL DOSEPAK) 4 MG TBPK tablet Take as directed 04/09/21  Yes Eliezer Lofts, FNP  acetaminophen (TYLENOL) 650 MG CR tablet Take 1,300 mg by mouth as needed.    [provider]  albuterol (VENTOLIN HFA) 108 (90 Base)  MCG/ACT inhaler Inhale 2 puffs by mouth into the lungs every 6 (six) hours as needed for wheezing or shortness of breath. 03/27/21   [name], DO  Ca Phosphate-Cholecalciferol (CALTRATE GUMMY BITES PO) Take 2 each 2 (two) times daily by mouth.     [provider]  conjugated estrogens (PREMARIN) vaginal cream INSERT 1 GRAM OF CREAM PER VAGINA THREE TIMES A WEEK BY VAGINAL ROUTE. 08/18/20 08/18/21  Earnstine Regal, PA-C  COVID-19 At Home Antigen Test [facility] COVID-19 HOME TEST) KIT Use as directed per package instructions. 03/26/21   Clementeen Graham, RPH  cyclobenzaprine (FLEXERIL) 10 MG tablet TAKE ONE TABLET (10 MG DOSE) BY MOUTH AT BEDTIME AS NEEDED. 08/04/20 08/04/21    diclofenac Sodium (VOLTAREN) 1 % GEL Apply 2 g topically See admin instructions.    [provider]  DULoxetine (CYMBALTA) 30 MG capsule 30 mg 2 (two) times daily. 2 TABS BID    [provider]  Efinaconazole (JUBLIA) 10 % SOLN Apply to affected toenails once daily for 48 weeks. 02/02/21   Marzetta Board, DPM  estradiol (VIVELLE-DOT) 0.1 MG/24HR Place 1 patch (0.1 mg total) onto the skin 2 (two) times a week. Monday and Thursday 06/27/12   [name], MD  fluticasone (FLOVENT HFA) 110 MCG/ACT inhaler Inhale 2 puffs by mouth into the lungs 2 (two) times daily. 03/30/21   Roma Schanz R, DO  furosemide (LASIX) 20 MG tablet TAKE 1 TABLET BY MOUTH EVERY MORNING 06/09/20 06/09/21  Lowne Chase, Yvonne R, DO  Galcanezumab-gnlm 120 MG/ML SOAJ INJECT 120 MG INTO THE SKIN EVERY 28 (TWENTY-EIGHT) DAYS. 08/26/20 08/26/21  Metta Clines R, DO  glucose blood test strip Test blood sugar once daily. Dx Code: E11.9 01/09/19   Carollee Herter, Alferd Apa, DO  hydrALAZINE (APRESOLINE) 25 MG tablet Take 1 tablet (25 mg total) by mouth 2 (two) times daily. Hold if feeling dizzy or SBP<110 mmHg).  Can use additional 25 mg as needed for SBP>150 mmHg 03/20/21   Leonie Man, MD  HYDROcodone-acetaminophen  (NORCO/VICODIN) 5-325 MG tablet Take 1 tablet by mouth every 6 (six) hours as needed.    [provider]  hydroxychloroquine (PLAQUENIL) 200 MG tablet Take 2 tablets by mouth once daily 01/30/21     loratadine (CLARITIN) 10 MG tablet Take  10 mg by mouth every evening.    [provider]  meclizine (ANTIVERT) 25 MG tablet Take 1 tablet (25 mg total) by mouth 3 (three) times daily as needed for dizziness. 08/01/20   Lucrezia Starch, MD  methocarbamol (ROBAXIN) 500 MG tablet Take one tablet (500 mg dose) by mouth every 4 (four) hours as needed. 10/20/20     NONFORMULARY OR COMPOUNDED ITEM bp cuff  Dx hypertension 09/25/19   Carollee Herter, Kendrick Fries R, DO  nystatin (MYCOSTATIN/NYSTOP) powder APPLY 1 APPLICATION TOPICALLY 3 (THREE) TIMES DAILY. 08/07/20 08/07/21  [name], DO  olmesartan (BENICAR) 20 MG tablet Take 1 tablet (20 mg total) by mouth daily with supper. 11/20/20   Leonie Man, MD  pantoprazole (PROTONIX) 40 MG tablet Take 1 tablet (40 mg total) by mouth daily. 11/21/20   [name], DO  Pediatric Multivit-Minerals-C (ONE-A-DAY SCOOBY-DOO GUMMIES PO) Take 1 tablet 2 (two) times daily by mouth.     [provider]  potassium chloride (KLOR-CON) 10 MEQ tablet TAKE 1 TABLET (10 MEQ TOTAL) BY MOUTH DAILY. 08/02/19   [name], DO  promethazine-codeine (PHENERGAN WITH CODEINE) 6.25-10 MG/5ML syrup Take 5 mLs by mouth every 6 (six) hours as needed for cough. 03/26/21   Roma Schanz R, DO  rosuvastatin (CRESTOR) 20 MG tablet TAKE 1 TABLET BY MOUTH ONCE DAILY 04/03/21   Carollee Herter, Yvonne R, DO  temazepam (RESTORIL) 30 MG capsule TAKE 1 CAPSULE (30 MG TOTAL) BY MOUTH AT BEDTIME AS NEEDED FOR SLEEP. 12/09/20 06/18/21  [name], DO  thiamine (VITAMIN B-1) 100 MG tablet Take 100 mg daily by mouth.    [provider]  tocilizumab (ACTEMRA) 400 MG/20ML SOLN injection Inject 400 mg into the vein every 30 (thirty) days.     [provider]  traMADol (ULTRAM) 50 MG tablet Take 1 tablet (50 mg total) by mouth every 8 (eight) hours as needed. 02/25/21     Ubrogepant (UBRELVY) 100 MG TABS Take 100 mg by mouth as needed (as needed for migraine. MAX 2 tabs in 24 hours). 01/19/21   Pieter Partridge, DO    Family History Family History  Problem Relation Age of Onset   Diabetes Mother    Stroke Mother    Breast cancer Mother    Atrial fibrillation Mother    Hypertension Mother    Cancer Mother 81       breast   Diabetes Father    Stroke Father 43       2nd 48 month apart   Hypertension Father    Heart attack Father 90       stents   Dementia Father    AAA (abdominal aortic aneurysm) Father 23       repair   CAD Father 60   Thyroid disease Other    Heart disease Other    COPD Other     Social History Social History   Tobacco Use   Smoking status: Never   Smokeless tobacco: Never  Vaping Use   Vaping Use: Never used  Substance Use Topics   Alcohol use: Not Currently   Drug use: Never     Allergies   Isoniazid, Morphine, Nitrofurantoin, Oxycodone hcl, Hctz [hydrochlorothiazide], Oseltamivir phosphate, Promethazine hcl, Sulfa antibiotics, Tamiflu [oseltamivir phosphate], Wound dressing adhesive, Gabapentin, Macrolides and ketolides, Other, Percocet [oxycodone-acetaminophen], Roxicet [oxycodone-acetaminophen], Sulfamethoxazole-trimethoprim, Tamiflu [oseltamivir], and Fentanyl   Review of Systems Review of Systems  HENT:  Positive for congestion.   Respiratory:  Positive for cough.   All other systems reviewed and are negative.   Physical Exam Triage Vital Signs ED Triage Vitals [04/09/21 0818]  Enc Vitals Group     BP 119/71     Pulse Rate 88     Resp      Temp 99.4 F (37.4 C)     Temp Source Oral     SpO2 99 %     Weight      Height      Head Circumference      Peak Flow      Pain Score 0     Pain Loc      Pain Edu?      Excl. in [place]?    No data found.  Updated Vital  Signs BP 119/71 (BP Location: Right Arm)   Pulse 88   Temp 99.4 F (37.4 C) (Oral)   SpO2 99%       Physical Exam Vitals and nursing note reviewed.  Constitutional:      Appearance: Normal appearance. She is obese. She is ill-appearing.  HENT:     Head: Normocephalic and atraumatic.     Right Ear: Tympanic membrane, ear canal and external ear normal.     Left Ear: Tympanic membrane, ear canal and external ear normal.     Mouth/Throat:     Mouth: Mucous membranes are moist.     Pharynx: Oropharynx is clear.     Comments: Mild amount of clear drainage of posterior oropharynx noted Eyes:     Extraocular Movements: Extraocular movements intact.     Conjunctiva/sclera: Conjunctivae normal.     Pupils: Pupils are equal, round, and reactive to light.  Cardiovascular:     Rate and Rhythm: Normal rate and regular rhythm.     Pulses: Normal pulses.     Heart sounds: Normal heart sounds. No murmur heard.   No friction rub. No gallop.  Pulmonary:     Effort: Pulmonary effort is normal.     Breath sounds: No wheezing, rhonchi or rales.     Comments: No adventitious breath sounds noted, diminished breath sounds bibasilarly Musculoskeletal:        General: Normal range of motion.     Cervical back: Normal range of motion and neck supple. No tenderness.  Lymphadenopathy:     Cervical: No cervical adenopathy.  Skin:    General: Skin is warm and dry.  Neurological:     General: No focal deficit present.     Mental Status: She is alert and oriented to person, place, and time. Mental status is at baseline.  Psychiatric:        Mood and Affect: Mood normal.        Behavior: Behavior normal.        Thought Content: Thought content normal.     UC Treatments / Results  Labs (all labs ordered are listed, but only abnormal results are displayed) Labs Reviewed - No data to display  EKG   Radiology DG Chest 2 View  Result Date: 04/09/2021 CLINICAL DATA:  A 65 year old female  presents with cough. EXAM: CHEST - 2 VIEW COMPARISON:  August 01, 2020. FINDINGS: Trachea is midline. Cardiomediastinal contours and hilar structures are normal. Lungs are clear. No effusion. No visible pneumothorax. On limited assessment there is no acute skeletal process. IMPRESSION: No acute cardiopulmonary process. Electronically Signed   By: Zetta Bills M.D.   On: 04/09/2021 08:59  Procedures Procedures (including critical care time)  Medications Ordered in UC Medications - No data to display  Initial Impression / Assessment and Plan / UC Course  I have reviewed the triage vital signs and the nursing notes.  Pertinent labs & imaging results that were available during my care of the patient were reviewed by me and considered in my medical decision making (see chart for details).     MDM: 1.  Cough-CXR revealed no acute cardiopulmonary process. advised patient to continue using Flovent inhaler 2 puffs twice daily as previously prescribed.  Rx'd Medrol Dosepak; 2.  Fever-Advised/instructed patient conservative measures for now may alternate between OTC Tylenol 1000 mg 1-2 times daily, as needed with OTC Ibuprofen 600 mg 1-2 times daily, as needed for fever.  3.  Allergic rhinitis-Rx'd Allegra, Instructed patient to discontinue Claritin 10 mg and Hydralazine 25 mg and take Allegra 180 mg for the next 7 days for concurrent postnasal drainage/drip.  Encouraged patient to increase daily water intake while taking these medications.  Advised/instructed patient if cough worsens and/or unresolved please follow-up with PCP for further evaluation.  Patient discharged home, hemodynamically stable.  Final Clinical Impressions(s) / UC Diagnoses   Final diagnoses:  Cough  Fever, unspecified  Allergic rhinitis, unspecified seasonality, unspecified trigger     Discharge Instructions      Advised/instructed patient to take medication as directed with food to completion.  Advised/instructed patient  conservative measures for now may alternate between OTC Tylenol 1000 mg 1-2 times daily, as needed with OTC Ibuprofen 600 mg 1-2 times daily, as needed for fever.  Instructed patient to discontinue Claritin 10 mg and hydralazine 25 mg and take Allegra 180 mg for the next 7 days for concurrent postnasal drainage/drip.  Encouraged patient to increase daily water intake while taking these medications.  Advised/instructed patient if cough worsens and/or unresolved please follow-up with PCP for further evaluation.     ED Prescriptions     Medication Sig Dispense Auth. Provider   methylPREDNISolone (MEDROL DOSEPAK) 4 MG TBPK tablet Take as directed 1 each Eliezer Lofts, FNP   fexofenadine [facility] ALLERGY) 180 MG tablet Take 1 tablet (180 mg total) by mouth daily for 15 days. 15 tablet Eliezer Lofts, FNP      PDMP not reviewed this encounter.   Eliezer Lofts, [place] 04/09/21 [phone]

## 2021-04-09 NOTE — Discharge Instructions (Addendum)
Advised/instructed patient to take medication as directed with food to completion.  Advised/instructed patient conservative measures for now may alternate between OTC Tylenol 1000 mg 1-2 times daily, as needed with OTC Ibuprofen 600 mg 1-2 times daily, as needed for fever.  Instructed patient to discontinue Claritin 10 mg and Hydralazine 25 mg and take Allegra 180 mg for the next 7 days for concurrent postnasal drainage/drip.  Encouraged patient to increase daily water intake while taking these medications.  Advised/instructed patient if cough worsens and/or unresolved please follow-up with PCP for further evaluation.

## 2021-04-09 NOTE — ED Triage Notes (Signed)
Pt states she tested positive for covid x2 weeks ago. Still having cough, congestion, fatigue and nasal drainage. She was treated with antiviral and inhalers. Still using inhaler and cough syrup with some relief.

## 2021-04-13 ENCOUNTER — Other Ambulatory Visit: Payer: Self-pay | Admitting: Neurology

## 2021-04-13 ENCOUNTER — Other Ambulatory Visit (HOSPITAL_BASED_OUTPATIENT_CLINIC_OR_DEPARTMENT_OTHER): Payer: Self-pay

## 2021-04-13 MED ORDER — EMGALITY 120 MG/ML ~~LOC~~ SOAJ
SUBCUTANEOUS | 5 refills | Status: DC
Start: 2021-04-13 — End: 2021-04-28
  Filled 2021-04-13: qty 1, 28d supply, fill #0

## 2021-04-15 DIAGNOSIS — R519 Headache, unspecified: Secondary | ICD-10-CM | POA: Diagnosis not present

## 2021-04-15 DIAGNOSIS — H524 Presbyopia: Secondary | ICD-10-CM | POA: Diagnosis not present

## 2021-04-15 DIAGNOSIS — H52223 Regular astigmatism, bilateral: Secondary | ICD-10-CM | POA: Diagnosis not present

## 2021-04-15 DIAGNOSIS — H04123 Dry eye syndrome of bilateral lacrimal glands: Secondary | ICD-10-CM | POA: Diagnosis not present

## 2021-04-15 DIAGNOSIS — Z79899 Other long term (current) drug therapy: Secondary | ICD-10-CM | POA: Diagnosis not present

## 2021-04-15 DIAGNOSIS — H2513 Age-related nuclear cataract, bilateral: Secondary | ICD-10-CM | POA: Diagnosis not present

## 2021-04-15 DIAGNOSIS — H5203 Hypermetropia, bilateral: Secondary | ICD-10-CM | POA: Diagnosis not present

## 2021-04-15 DIAGNOSIS — E119 Type 2 diabetes mellitus without complications: Secondary | ICD-10-CM | POA: Diagnosis not present

## 2021-04-15 LAB — HM DIABETES EYE EXAM

## 2021-04-16 ENCOUNTER — Other Ambulatory Visit (HOSPITAL_BASED_OUTPATIENT_CLINIC_OR_DEPARTMENT_OTHER): Payer: Self-pay

## 2021-04-16 DIAGNOSIS — Z6831 Body mass index (BMI) 31.0-31.9, adult: Secondary | ICD-10-CM | POA: Diagnosis not present

## 2021-04-16 DIAGNOSIS — M5416 Radiculopathy, lumbar region: Secondary | ICD-10-CM | POA: Diagnosis not present

## 2021-04-16 DIAGNOSIS — M4317 Spondylolisthesis, lumbosacral region: Secondary | ICD-10-CM | POA: Diagnosis not present

## 2021-04-16 DIAGNOSIS — I10 Essential (primary) hypertension: Secondary | ICD-10-CM | POA: Diagnosis not present

## 2021-04-16 DIAGNOSIS — M545 Low back pain, unspecified: Secondary | ICD-10-CM | POA: Diagnosis not present

## 2021-04-16 MED ORDER — METHOCARBAMOL 500 MG PO TABS
ORAL_TABLET | ORAL | 0 refills | Status: DC
  Filled 2021-04-16: qty 120, 30d supply, fill #0

## 2021-04-21 ENCOUNTER — Other Ambulatory Visit (HOSPITAL_BASED_OUTPATIENT_CLINIC_OR_DEPARTMENT_OTHER): Payer: Self-pay

## 2021-04-21 ENCOUNTER — Other Ambulatory Visit: Payer: Self-pay

## 2021-04-21 ENCOUNTER — Ambulatory Visit (INDEPENDENT_AMBULATORY_CARE_PROVIDER_SITE_OTHER): Payer: 59 | Admitting: Family Medicine

## 2021-04-21 ENCOUNTER — Encounter: Payer: Self-pay | Admitting: Family Medicine

## 2021-04-21 VITALS — BP 130/60 | HR 87 | Temp 98.9°F | Resp 18 | Ht 71.0 in | Wt 212.8 lb

## 2021-04-21 DIAGNOSIS — J40 Bronchitis, not specified as acute or chronic: Secondary | ICD-10-CM

## 2021-04-21 DIAGNOSIS — U071 COVID-19: Secondary | ICD-10-CM | POA: Diagnosis not present

## 2021-04-21 DIAGNOSIS — E785 Hyperlipidemia, unspecified: Secondary | ICD-10-CM | POA: Diagnosis not present

## 2021-04-21 DIAGNOSIS — G4700 Insomnia, unspecified: Secondary | ICD-10-CM

## 2021-04-21 DIAGNOSIS — E1165 Type 2 diabetes mellitus with hyperglycemia: Secondary | ICD-10-CM | POA: Insufficient documentation

## 2021-04-21 DIAGNOSIS — K219 Gastro-esophageal reflux disease without esophagitis: Secondary | ICD-10-CM

## 2021-04-21 DIAGNOSIS — Z01818 Encounter for other preprocedural examination: Secondary | ICD-10-CM | POA: Diagnosis not present

## 2021-04-21 DIAGNOSIS — E1169 Type 2 diabetes mellitus with other specified complication: Secondary | ICD-10-CM | POA: Diagnosis not present

## 2021-04-21 DIAGNOSIS — M47812 Spondylosis without myelopathy or radiculopathy, cervical region: Secondary | ICD-10-CM

## 2021-04-21 DIAGNOSIS — Z23 Encounter for immunization: Secondary | ICD-10-CM | POA: Diagnosis not present

## 2021-04-21 DIAGNOSIS — E1140 Type 2 diabetes mellitus with diabetic neuropathy, unspecified: Secondary | ICD-10-CM

## 2021-04-21 DIAGNOSIS — I10 Essential (primary) hypertension: Secondary | ICD-10-CM | POA: Diagnosis not present

## 2021-04-21 DIAGNOSIS — M48061 Spinal stenosis, lumbar region without neurogenic claudication: Secondary | ICD-10-CM

## 2021-04-21 LAB — COMPREHENSIVE METABOLIC PANEL
ALT: 29 U/L (ref 0–35)
AST: 26 U/L (ref 0–37)
Albumin: 4 g/dL (ref 3.5–5.2)
Alkaline Phosphatase: 67 U/L (ref 39–117)
BUN: 8 mg/dL (ref 6–23)
CO2: 30 mEq/L (ref 19–32)
Calcium: 9.5 mg/dL (ref 8.4–10.5)
Chloride: 102 mEq/L (ref 96–112)
Creatinine, Ser: 0.83 mg/dL (ref 0.40–1.20)
GFR: 74.56 mL/min (ref >60.00)
Glucose, Bld: 119 mg/dL — ABNORMAL HIGH (ref 70–99)
Potassium: 4.4 mEq/L (ref 3.5–5.1)
Sodium: 139 mEq/L (ref 135–145)
Total Bilirubin: 0.8 mg/dL (ref 0.2–1.2)
Total Protein: 6.5 g/dL (ref 6.0–8.3)

## 2021-04-21 LAB — LIPID PANEL
Cholesterol: 123 mg/dL (ref 0–200)
HDL: 70.4 mg/dL (ref >39.00)
LDL Cholesterol: 38 mg/dL (ref 0–99)
NonHDL: 52.88
Total CHOL/HDL Ratio: 2
Triglycerides: 74 mg/dL (ref 0.0–149.0)
VLDL: 14.8 mg/dL (ref 0.0–40.0)

## 2021-04-21 LAB — CBC WITH DIFFERENTIAL/PLATELET
Basophils Absolute: 0 10*3/uL (ref 0.0–0.1)
Basophils Relative: 0.5 % (ref 0.0–3.0)
Eosinophils Absolute: 0.1 10*3/uL (ref 0.0–0.7)
Eosinophils Relative: 0.9 % (ref 0.0–5.0)
HCT: 32.2 % — ABNORMAL LOW (ref 36.0–46.0)
Hemoglobin: 10 g/dL — ABNORMAL LOW (ref 12.0–15.0)
Lymphocytes Relative: 24.7 % (ref 12.0–46.0)
Lymphs Abs: 2.1 10*3/uL (ref 0.7–4.0)
MCHC: 31.2 g/dL (ref 30.0–36.0)
MCV: 75.5 fl — ABNORMAL LOW (ref 78.0–100.0)
Monocytes Absolute: 0.9 10*3/uL (ref 0.1–1.0)
Monocytes Relative: 10.7 % (ref 3.0–12.0)
Neutro Abs: 5.4 10*3/uL (ref 1.4–7.7)
Neutrophils Relative %: 63.2 % (ref 43.0–77.0)
Platelets: 310 10*3/uL (ref 150.0–400.0)
RBC: 4.26 Mil/uL (ref 3.87–5.11)
RDW: 16.2 % — ABNORMAL HIGH (ref 11.5–15.5)
WBC: 8.5 10*3/uL (ref 4.0–10.5)

## 2021-04-21 LAB — TSH: TSH: 2.47 u[IU]/mL (ref 0.35–5.50)

## 2021-04-21 LAB — HEMOGLOBIN A1C: Hgb A1c MFr Bld: 7.3 % — ABNORMAL HIGH (ref 4.6–6.5)

## 2021-04-21 LAB — MICROALBUMIN / CREATININE URINE RATIO
Creatinine,U: 62.1 mg/dL
Microalb Creat Ratio: 1.1 mg/g (ref 0.0–30.0)
Microalb, Ur: <0.7 mg/dL (ref 0.0–1.9)

## 2021-04-21 MED ORDER — PANTOPRAZOLE SODIUM 40 MG PO TBEC
40.0000 mg | DELAYED_RELEASE_TABLET | Freq: Every day | ORAL | 3 refills | Status: DC
Start: 2021-04-21 — End: 2021-10-22
  Filled 2021-04-21: qty 90, 90d supply, fill #0
  Filled 2021-07-07: qty 90, 90d supply, fill #1
  Filled 2021-10-21: qty 90, 90d supply, fill #2

## 2021-04-21 MED ORDER — FLUTICASONE PROPIONATE HFA 110 MCG/ACT IN AERO
2.0000 | INHALATION_SPRAY | Freq: Two times a day (BID) | RESPIRATORY_TRACT | 12 refills | Status: DC
  Filled 2021-04-21: qty 12, 30d supply, fill #0

## 2021-04-21 MED ORDER — FUROSEMIDE 20 MG PO TABS
ORAL_TABLET | Freq: Every morning | ORAL | 1 refills | Status: DC
Start: 2021-04-21 — End: 2022-03-22
  Filled 2021-04-21: qty 90, 90d supply, fill #0
  Filled 2021-12-23: qty 90, 90d supply, fill #1

## 2021-04-21 MED ORDER — ALBUTEROL SULFATE HFA 108 (90 BASE) MCG/ACT IN AERS
2.0000 | INHALATION_SPRAY | Freq: Four times a day (QID) | RESPIRATORY_TRACT | 0 refills | Status: DC | PRN
  Filled 2021-04-21: qty 18, 25d supply, fill #0

## 2021-04-21 MED ORDER — POTASSIUM CHLORIDE CRYS ER 10 MEQ PO TBCR
EXTENDED_RELEASE_TABLET | ORAL | 1 refills | Status: DC
Start: 2021-04-21 — End: 2022-03-22
  Filled 2021-04-21: qty 90, 90d supply, fill #0
  Filled 2021-12-23: qty 90, 90d supply, fill #1

## 2021-04-21 MED ORDER — ROSUVASTATIN CALCIUM 20 MG PO TABS
ORAL_TABLET | Freq: Every day | ORAL | 1 refills | Status: DC
Start: 2021-04-21 — End: 2021-10-22
  Filled 2021-04-21: qty 90, fill #0
  Filled 2021-08-24: qty 90, 90d supply, fill #0

## 2021-04-21 MED ORDER — TEMAZEPAM 30 MG PO CAPS
ORAL_CAPSULE | ORAL | 3 refills | Status: DC
Start: 2021-04-21 — End: 2021-04-28
  Filled 2021-04-21: qty 90, fill #0

## 2021-04-21 NOTE — Assessment & Plan Note (Signed)
Well controlled, no changes to meds. Encouraged heart healthy diet such as the DASH diet and exercise as tolerated.  °

## 2021-04-21 NOTE — Assessment & Plan Note (Signed)
Pt having surgery at [facility] at the end of the month

## 2021-04-21 NOTE — Assessment & Plan Note (Signed)
Pt to see cardiology for clearance as well ekg RBBB---  Not new

## 2021-04-21 NOTE — Assessment & Plan Note (Signed)
hgba1c to be checked, minimize simple carbs. Increase exercise as tolerated. Continue current meds  

## 2021-04-21 NOTE — Progress Notes (Signed)
Subjective:   By signing my name below, I, [name], attest that this documentation has been prepared under the direction and in the presence of Dr. Roma Schanz, DO. 04/21/2021    Patient ID: [name], female    DOB: 06-29-1956, 65 y.o.   MRN: [id number]  Chief Complaint  Patient presents with   Surgical Clearance     Pt states having back surgery on Nov 28th.     HPI Patient is in today for a surgical clearance visit. She requesting surgical clearance for  spinal fusion surgery. She continues complaining of back pain. She is planning on getting surgery in [place], [place] on 06/08/2021.  She reports since getting Covid-19 she is experiencing frequent episodes of dry mouth.  She has an upcoming procedure for her carotid arteries in January, 2023. She reports her blood pressures have different measurements on both arms. Her cardiologist is managing this issue at this time.    Past Medical History:  Diagnosis Date   Allergic rhinitis    Anxiety    Asthma    hx bronchial asthma at times with upper resp infection   Depression    Fibromyalgia    GERD (gastroesophageal reflux disease)    Headache(784.0)    migraine and cluster headaches   Hyperlipemia    Hypertension    IBS (irritable bowel syndrome)    Menopause    Neuromuscular disorder ([place]) 2009   hx of fibromyalgia, polyarthralsia (surgery induced)   Osteoarthritis    Rheumatoid arthritis ([place])    Complicated by osteoarthritis as well.   Rheumatoid arthritis ([place])    Stress incontinence    at times   Tibial fracture 10/14/2016   evulsion periostial right   Type 2 diabetes mellitus with complication, without long-term current use of insulin (HCC)    diet controlled no meds -> however was diagnosed with a diabetic foot ulcer.    Past Surgical History:  Procedure Laterality Date   ABDOMINAL HYSTERECTOMY  12/1999   complete   CARPAL TUNNEL RELEASE Right 12/27/2017   Procedure: CARPAL TUNNEL RELEASE;   Surgeon: Roseanne Kaufman, MD;  Location: [facility];  Service: Orthopedics;  Laterality: Right;   CERVICAL CONIZATION W/BX  june 1990   dysplasia of cervix/used 5Fu cream for 3 months   CERVICAL LAMINECTOMY  2005 & 2009   X 2   NO ROM [place]   X 2   CHOLECYSTECTOMY  1986   DILATION AND CURETTAGE OF UTERUS   [phone]   missed abortion   ESOPHAGOGASTRODUODENOSCOPY  06/29/2011   Procedure: ESOPHAGOGASTRODUODENOSCOPY (EGD);  Surgeon: Shann Medal, MD;  Location: [facility];  Service: General;  Laterality: N/A;   FOOT SURGERY  2005   RT HEEL   GASTRIC BANDING PORT REVISION  09/24/2011   Procedure: GASTRIC BANDING PORT REVISION;  Surgeon: Pedro Earls, MD;  Location: [facility];  Service: General;  Laterality: N/A;   GASTRIC ROUX-EN-Y N/A 06/06/2017   Procedure: Conversion from [place] to [place];  Surgeon: Johnathan Hausen, MD;  Location: [facility];  Service: General;  Laterality: N/A;   [place]  12/30/09   LAPAROSCOPIC GASTRIC SLEEVE RESECTION N/A 07/02/2013   Procedure: LAPAROSCOPIC GASTRIC SLEEVE RESECTION upper endoscopy;  Surgeon: Pedro Earls, MD;  Location: [facility];  Service: General;  Laterality: N/A;   LAPAROSCOPIC REPAIR AND REMOVAL OF GASTRIC BAND N/A 07/02/2013  Procedure: LAPAROSCOPIC REMOVAL OF GASTRIC BAND;  Surgeon: Pedro Earls, MD;  Location: [facility];  Service: General;  Laterality: N/A;   LAPAROSCOPIC REVISION OF GASTRIC BAND  07/03/2012   Procedure: LAPAROSCOPIC REVISION OF GASTRIC BAND;  Surgeon: Pedro Earls, MD;  Location: [facility];  Service: General;  Laterality: N/A;  removal of old lap. band port, replaced with AP standard band   LAPAROSCOPY  09/24/2011   Procedure: LAPAROSCOPY DIAGNOSTIC;  Surgeon: Pedro Earls, MD;  Location: [facility];  Service: General;  Laterality: N/A;   LAPAROSCOPY  07/03/2012   Procedure: LAPAROSCOPY DIAGNOSTIC;   Surgeon: Pedro Earls, MD;  Location: [facility];  Service: General;  Laterality: N/A;  Exploratory Laparoscopy    MESH APPLIED TO LAP PORT  07/03/2012   Procedure: MESH APPLIED TO LAP PORT;  Surgeon: Pedro Earls, MD;  Location: [facility];  Service: General;  Laterality: N/A;   [place]   X 2   right knee  1981   ARTHROSCOPY AND ARTHROTOMY   right knee arthroscopy and arthrotomy  12-1979   TONSILLECTOMY  1971   TUBAL LIGATION  1993   WITH C -SECTION    Family History  Problem Relation Age of Onset   Diabetes Mother    Stroke Mother    Breast cancer Mother    Atrial fibrillation Mother    Hypertension Mother    Cancer Mother 46       breast   Diabetes Father    Stroke Father 52       2nd 49 month apart   Hypertension Father    Heart attack Father 75       stents   Dementia Father    AAA (abdominal aortic aneurysm) Father 9       repair   CAD Father 76   Thyroid disease Other    Heart disease Other    COPD Other     Social History   Socioeconomic History   Marital status: Married    Spouse name: Anastacia Reinecke   Number of children: 2   Years of education: Not on file   Highest education level: Not on file  Occupational History   Occupation: Programmer, multimedia: [employer]  Tobacco Use   Smoking status: Never   Smokeless tobacco: Never  Vaping Use   Vaping Use: Never used  Substance and Sexual Activity   Alcohol use: Not Currently   Drug use: Never   Sexual activity: Yes    Birth control/protection: None  Other Topics Concern   Not on file  Social History Narrative   Right handed   Two story home   Drinks caffeine occasionally   Social Determinants of Health   Financial Resource Strain: Not on file  Food Insecurity: Not on file  Transportation Needs: Not on file  Physical Activity: Not on file  Stress: Not on file  Social Connections: Not on file  Intimate Partner Violence: Not on file    Outpatient  Medications Prior to Visit  Medication Sig Dispense Refill   acetaminophen (TYLENOL) 650 MG CR tablet Take 1,300 mg by mouth as needed.     Ca Phosphate-Cholecalciferol (CALTRATE GUMMY BITES PO) Take 2 each 2 (two) times daily by mouth.      conjugated estrogens (PREMARIN) vaginal cream INSERT 1 GRAM OF CREAM PER VAGINA THREE TIMES A WEEK BY VAGINAL ROUTE. 30 g 5   cyclobenzaprine (FLEXERIL) 10  MG tablet TAKE ONE TABLET (10 MG DOSE) BY MOUTH AT BEDTIME AS NEEDED. 30 tablet 2   diclofenac Sodium (VOLTAREN) 1 % GEL Apply 2 g topically See admin instructions.     DULoxetine (CYMBALTA) 30 MG capsule 30 mg 2 (two) times daily. 2 TABS BID     Efinaconazole (JUBLIA) 10 % SOLN Apply to affected toenails once daily for 48 weeks. 8 mL 11   estradiol (VIVELLE-DOT) 0.1 MG/24HR Place 1 patch (0.1 mg total) onto the skin 2 (two) times a week. Monday and Thursday 8 patch 11   fexofenadine (ALLEGRA ALLERGY) 180 MG tablet Take 1 tablet (180 mg total) by mouth daily. 30 tablet 0   Galcanezumab-gnlm (EMGALITY) 120 MG/ML SOAJ INJECT 120 MG INTO THE SKIN EVERY 28 (TWENTY-EIGHT) DAYS. 1 mL 5   glucose blood test strip Test blood sugar once daily. Dx Code: E11.9 100 each 12   hydrALAZINE (APRESOLINE) 25 MG tablet Take 1 tablet (25 mg total) by mouth 2 (two) times daily. Hold if feeling dizzy or SBP<110 mmHg).  Can use additional 25 mg as needed for SBP>150 mmHg 180 tablet 3   HYDROcodone-acetaminophen (NORCO/VICODIN) 5-325 MG tablet Take 1 tablet by mouth every 6 (six) hours as needed.     hydroxychloroquine (PLAQUENIL) 200 MG tablet Take 2 tablets by mouth once daily 60 tablet 3   loratadine (CLARITIN) 10 MG tablet Take 10 mg by mouth every evening.     meclizine (ANTIVERT) 25 MG tablet Take 1 tablet (25 mg total) by mouth 3 (three) times daily as needed for dizziness. 20 tablet 0   methocarbamol (ROBAXIN) 500 MG tablet TAKE 1 TABLET BY MOUTH EVERY 6 HOURS AS NEEDED FOR MUSCLE SPASMS 120 tablet 0   methylPREDNISolone  (MEDROL DOSEPAK) 4 MG TBPK tablet Take as directed 21 tablet 0   NONFORMULARY OR COMPOUNDED ITEM bp cuff  Dx hypertension 1 each 0   nystatin (MYCOSTATIN/NYSTOP) powder APPLY 1 APPLICATION TOPICALLY 3 (THREE) TIMES DAILY. 15 g 5   olmesartan (BENICAR) 20 MG tablet Take 1 tablet (20 mg total) by mouth daily with supper. 90 tablet 3   Pediatric Multivit-Minerals-C (ONE-A-DAY SCOOBY-DOO GUMMIES PO) Take 1 tablet 2 (two) times daily by mouth.      promethazine-codeine (PHENERGAN WITH CODEINE) 6.25-10 MG/5ML syrup Take 5 mLs by mouth every 6 (six) hours as needed for cough. 120 mL 0   thiamine (VITAMIN B-1) 100 MG tablet Take 100 mg daily by mouth.     tocilizumab (ACTEMRA) 400 MG/20ML SOLN injection Inject 400 mg into the vein every 30 (thirty) days.     Ubrogepant (UBRELVY) 100 MG TABS Take 100 mg by mouth as needed (as needed for migraine. MAX 2 tabs in 24 hours). 16 tablet 2   albuterol (VENTOLIN HFA) 108 (90 Base) MCG/ACT inhaler Inhale 2 puffs by mouth into the lungs every 6 (six) hours as needed for wheezing or shortness of breath. 18 g 0   fluticasone (FLOVENT HFA) 110 MCG/ACT inhaler Inhale 2 puffs by mouth into the lungs 2 (two) times daily. 12 g 12   furosemide (LASIX) 20 MG tablet TAKE 1 TABLET BY MOUTH EVERY MORNING 90 tablet 1   pantoprazole (PROTONIX) 40 MG tablet Take 1 tablet (40 mg total) by mouth daily. 90 tablet 3   potassium chloride (KLOR-CON) 10 MEQ tablet TAKE 1 TABLET (10 MEQ TOTAL) BY MOUTH DAILY. 90 tablet 1   rosuvastatin (CRESTOR) 20 MG tablet TAKE 1 TABLET BY MOUTH ONCE DAILY 90 tablet 1  temazepam (RESTORIL) 30 MG capsule TAKE 1 CAPSULE (30 MG TOTAL) BY MOUTH AT BEDTIME AS NEEDED FOR SLEEP. 90 capsule 3   COVID-19 At Home Antigen Test (CARESTART COVID-19 HOME TEST) KIT Use as directed per package instructions. (Patient not taking: Reported on 04/21/2021) 2 kit 0   No facility-administered medications prior to visit.    Allergies  Allergen Reactions   Isoniazid      Severe SOB   Morphine Other (See Comments), Nausea And Vomiting and Hives    severe vomiting severe vomiting  Other reaction(s): Vomiting    Nitrofurantoin Shortness Of Breath, Other (See Comments) and Rash    REACTION: welts REACTION: welts   Oxycodone Hcl Hives and Itching   Hctz [Hydrochlorothiazide]     rash   Oseltamivir Phosphate Nausea And Vomiting    "I vomited within 30 minutes of taking it and was told I cannot ever take it again."   Promethazine Hcl Other (See Comments)    IF GIVEN  IV-hallucinations     CAN TAKE PO PHENERGAN   Sulfa Antibiotics Rash   Tamiflu [Oseltamivir Phosphate] Nausea And Vomiting    "I vomited within 30 minutes of taking it and was told I cannot ever take it again."   Wound Dressing Adhesive Rash    Adhesive Tape    Gabapentin     Pt states she has eye swelling    Macrolides And Ketolides     rash   Other Other (See Comments)    bandaids   Percocet [Oxycodone-Acetaminophen]     REACTION: severe itching. Can take by mouth codeine   Roxicet [Oxycodone-Acetaminophen]     itching   Sulfamethoxazole-Trimethoprim     Septra / Bactrim  --REACTION: rash   Tamiflu [Oseltamivir]     Other reaction(s): Unknown   Fentanyl Hives and Rash    REACTION: welts    Review of Systems  Constitutional:  Negative for chills, fever and malaise/fatigue.  HENT:  Negative for congestion and hearing loss.        (+)dry mouth  Eyes:  Negative for blurred vision and discharge.  Respiratory:  Negative for cough, sputum production and shortness of breath.   Cardiovascular:  Negative for chest pain, palpitations and leg swelling.  Gastrointestinal:  Negative for abdominal pain, blood in stool, constipation, diarrhea, heartburn, nausea and vomiting.  Genitourinary:  Negative for dysuria, frequency, hematuria and urgency.  Musculoskeletal:  Positive for back pain. Negative for falls and myalgias.  Skin:  Negative for rash.  Neurological:  Positive for tingling.  Negative for dizziness, sensory change, loss of consciousness, weakness and headaches.  Endo/Heme/Allergies:  Negative for environmental allergies. Does not bruise/bleed easily.  Psychiatric/Behavioral:  Negative for depression and suicidal ideas. The patient is not nervous/anxious and does not have insomnia.       Objective:    Physical Exam Vitals and nursing note reviewed.  Constitutional:      General: She is not in acute distress.    Appearance: Normal appearance. She is not ill-appearing.  HENT:     Head: Normocephalic and atraumatic.     Right Ear: External ear normal.     Left Ear: External ear normal.  Eyes:     Extraocular Movements: Extraocular movements intact.     Pupils: Pupils are equal, round, and reactive to light.  Cardiovascular:     Rate and Rhythm: Normal rate and regular rhythm.     Heart sounds: Normal heart sounds. No murmur heard.   No gallop.  Pulmonary:     Effort: Pulmonary effort is normal. No respiratory distress.     Breath sounds: Normal breath sounds. No wheezing or rales.  Musculoskeletal:        General: Tenderness present.     Comments: Low back pain   Skin:    General: Skin is warm and dry.  Neurological:     Mental Status: She is alert and oriented to person, place, and time.  Psychiatric:        Behavior: Behavior normal.        Judgment: Judgment normal.    BP 130/60 (BP Location: Right Arm, Patient Position: Sitting, Cuff Size: Normal)   Pulse 87   Temp 98.9 F (37.2 C) (Oral)   Resp 18   Ht '5\' 11"'  (1.803 m)   Wt 212 lb 12.8 oz (96.5 kg)   SpO2 97%   BMI 29.68 kg/m  Wt Readings from Last 3 Encounters:  04/21/21 212 lb 12.8 oz (96.5 kg)  02/27/21 214 lb (97.1 kg)  01/09/21 219 lb 6.4 oz (99.5 kg)    Diabetic Foot Exam - Simple   No data filed    Lab Results  Component Value Date   WBC 5.3 09/11/2020   HGB 11.3 (L) 09/11/2020   HCT 35.1 (L) 09/11/2020   PLT 312.0 09/11/2020   GLUCOSE 102 (H) 08/07/2020   CHOL 152  07/21/2020   TRIG 77.0 07/21/2020   HDL 82.80 07/21/2020   LDLCALC 54 07/21/2020   ALT 15 09/08/2020   AST 19 09/08/2020   NA 136 (A) 09/08/2020   K 4.3 09/08/2020   CL 100 09/08/2020   CREATININE 0.8 09/08/2020   BUN 16 09/08/2020   CO2 21 09/08/2020   TSH 1.85 09/08/2020   HGBA1C 6.1 07/21/2020   MICROALBUR 0.8 08/02/2019    Lab Results  Component Value Date   TSH 1.85 09/08/2020   Lab Results  Component Value Date   WBC 5.3 09/11/2020   HGB 11.3 (L) 09/11/2020   HCT 35.1 (L) 09/11/2020   MCV 82.8 09/11/2020   PLT 312.0 09/11/2020   Lab Results  Component Value Date   NA 136 (A) 09/08/2020   K 4.3 09/08/2020   CO2 21 09/08/2020   GLUCOSE 102 (H) 08/07/2020   BUN 16 09/08/2020   CREATININE 0.8 09/08/2020   BILITOT 0.4 08/07/2020   ALKPHOS 83 09/08/2020   AST 19 09/08/2020   ALT 15 09/08/2020   PROT 6.7 08/07/2020   ALBUMIN 3.8 09/08/2020   CALCIUM 8.8 09/08/2020   ANIONGAP 10 08/01/2020   GFR 68.91 08/07/2020   Lab Results  Component Value Date   CHOL 152 07/21/2020   Lab Results  Component Value Date   HDL 82.80 07/21/2020   Lab Results  Component Value Date   LDLCALC 54 07/21/2020   Lab Results  Component Value Date   TRIG 77.0 07/21/2020   Lab Results  Component Value Date   CHOLHDL 2 07/21/2020   Lab Results  Component Value Date   HGBA1C 6.1 07/21/2020       Assessment & Plan:   Problem List Items Addressed This Visit       Unprioritized   Hyperlipidemia associated with type 2 diabetes mellitus ([place]) (Chronic)    Encourage heart healthy diet such as MIND or DASH diet, increase exercise, avoid trans fats, simple carbohydrates and processed foods, consider a krill or fish or flaxseed oil cap daily.       Relevant Medications  rosuvastatin (CRESTOR) 20 MG tablet   furosemide (LASIX) 20 MG tablet   Other Relevant Orders   Lipid panel   CBC with Differential/Platelet   Hemoglobin A1c   Comprehensive metabolic panel    Microalbumin / creatinine urine ratio   TSH   GERD (gastroesophageal reflux disease)   Relevant Medications   pantoprazole (PROTONIX) 40 MG tablet   Bronchitis due to COVID-19 virus   Relevant Medications   albuterol (VENTOLIN HFA) 108 (90 Base) MCG/ACT inhaler   Controlled type 2 diabetes mellitus with diabetic neuropathy, without long-term current use of insulin (HCC)    Check labs today      Relevant Medications   rosuvastatin (CRESTOR) 20 MG tablet   Controlled type 2 diabetes mellitus with hyperglycemia, without long-term current use of insulin (HCC)    hgba1c to be checked, minimize simple carbs. Increase exercise as tolerated. Continue current meds       Relevant Medications   rosuvastatin (CRESTOR) 20 MG tablet   Other Relevant Orders   Lipid panel   CBC with Differential/Platelet   Hemoglobin A1c   Comprehensive metabolic panel   Microalbumin / creatinine urine ratio   TSH   Pre-operative clearance - Primary    Pt to see cardiology for clearance as well ekg RBBB---  Not new        Relevant Orders   EKG 12-Lead (Completed)   Primary hypertension    Well controlled, no changes to meds. Encouraged heart healthy diet such as the DASH diet and exercise as tolerated.       Relevant Medications   rosuvastatin (CRESTOR) 20 MG tablet   furosemide (LASIX) 20 MG tablet   Other Relevant Orders   Lipid panel   CBC with Differential/Platelet   Hemoglobin A1c   Comprehensive metabolic panel   Microalbumin / creatinine urine ratio   TSH   Spinal stenosis of lumbar region   Spondylosis of cervical spine    Pt having surgery at [facility] at the end of the month      Other Visit Diagnoses     Need for influenza vaccination       Relevant Orders   Flu Vaccine QUAD 44moIM (Fluarix, Fluzone & Alfiuria Quad PF) (Completed)   Insomnia, unspecified type       Relevant Medications   temazepam (RESTORIL) 30 MG capsule   Hyperlipidemia, unspecified  hyperlipidemia type       Relevant Medications   rosuvastatin (CRESTOR) 20 MG tablet   furosemide (LASIX) 20 MG tablet   Essential hypertension       Relevant Medications   rosuvastatin (CRESTOR) 20 MG tablet   potassium chloride (KLOR-CON) 10 MEQ tablet   furosemide (LASIX) 20 MG tablet        Meds ordered this encounter  Medications   temazepam (RESTORIL) 30 MG capsule    Sig: TAKE 1 CAPSULE (30 MG TOTAL) BY MOUTH AT BEDTIME AS NEEDED FOR SLEEP.    Dispense:  90 capsule    Refill:  3   rosuvastatin (CRESTOR) 20 MG tablet    Sig: TAKE 1 TABLET BY MOUTH ONCE DAILY    Dispense:  90 tablet    Refill:  1   potassium chloride (KLOR-CON) 10 MEQ tablet    Sig: TAKE 1 TABLET (10 MEQ TOTAL) BY MOUTH DAILY.    Dispense:  90 tablet    Refill:  1   pantoprazole (PROTONIX) 40 MG tablet    Sig: Take 1 tablet (40 mg  total) by mouth daily.    Dispense:  90 tablet    Refill:  3   furosemide (LASIX) 20 MG tablet    Sig: TAKE 1 TABLET BY MOUTH EVERY MORNING    Dispense:  90 tablet    Refill:  1   fluticasone (FLOVENT HFA) 110 MCG/ACT inhaler    Sig: Inhale 2 puffs by mouth into the lungs 2 (two) times daily.    Dispense:  12 g    Refill:  12   albuterol (VENTOLIN HFA) 108 (90 Base) MCG/ACT inhaler    Sig: Inhale 2 puffs by mouth into the lungs every 6 (six) hours as needed for wheezing or shortness of breath.    Dispense:  18 g    Refill:  0    I, Dr. Roma Schanz, DO, personally preformed the services described in this documentation.  All medical record entries made by the scribe were at my direction and in my presence.  I have reviewed the chart and discharge instructions (if applicable) and agree that the record reflects my personal performance and is accurate and complete. 04/21/2021   I,[name],acting as a scribe for [name], DO.,have documented all relevant documentation on the behalf of [name], DO,as directed by  [name], DO  while in the presence of [name], DO.   [name], DO

## 2021-04-21 NOTE — Assessment & Plan Note (Signed)
Encourage heart healthy diet such as MIND or DASH diet, increase exercise, avoid trans fats, simple carbohydrates and processed foods, consider a krill or fish or flaxseed oil cap daily.  °

## 2021-04-21 NOTE — Patient Instructions (Signed)
Call cardiology about clearance

## 2021-04-21 NOTE — Assessment & Plan Note (Signed)
Check labs today.

## 2021-04-22 ENCOUNTER — Other Ambulatory Visit (HOSPITAL_BASED_OUTPATIENT_CLINIC_OR_DEPARTMENT_OTHER): Payer: Self-pay

## 2021-04-22 ENCOUNTER — Encounter: Payer: Self-pay | Admitting: Family Medicine

## 2021-04-22 DIAGNOSIS — M791 Myalgia, unspecified site: Secondary | ICD-10-CM | POA: Diagnosis not present

## 2021-04-22 DIAGNOSIS — M797 Fibromyalgia: Secondary | ICD-10-CM | POA: Diagnosis not present

## 2021-04-22 DIAGNOSIS — M1990 Unspecified osteoarthritis, unspecified site: Secondary | ICD-10-CM | POA: Diagnosis not present

## 2021-04-22 DIAGNOSIS — M549 Dorsalgia, unspecified: Secondary | ICD-10-CM | POA: Diagnosis not present

## 2021-04-22 DIAGNOSIS — M0609 Rheumatoid arthritis without rheumatoid factor, multiple sites: Secondary | ICD-10-CM | POA: Diagnosis not present

## 2021-04-22 DIAGNOSIS — Z79899 Other long term (current) drug therapy: Secondary | ICD-10-CM | POA: Diagnosis not present

## 2021-04-22 DIAGNOSIS — M79643 Pain in unspecified hand: Secondary | ICD-10-CM | POA: Diagnosis not present

## 2021-04-22 DIAGNOSIS — M2550 Pain in unspecified joint: Secondary | ICD-10-CM | POA: Diagnosis not present

## 2021-04-22 DIAGNOSIS — G8929 Other chronic pain: Secondary | ICD-10-CM | POA: Diagnosis not present

## 2021-04-23 ENCOUNTER — Other Ambulatory Visit: Payer: Self-pay | Admitting: Family Medicine

## 2021-04-23 ENCOUNTER — Encounter: Payer: Self-pay | Admitting: Family Medicine

## 2021-04-23 ENCOUNTER — Other Ambulatory Visit (HOSPITAL_BASED_OUTPATIENT_CLINIC_OR_DEPARTMENT_OTHER): Payer: Self-pay

## 2021-04-23 ENCOUNTER — Other Ambulatory Visit: Payer: Self-pay

## 2021-04-23 DIAGNOSIS — D649 Anemia, unspecified: Secondary | ICD-10-CM

## 2021-04-23 MED ORDER — OZEMPIC (0.25 OR 0.5 MG/DOSE) 2 MG/1.5ML ~~LOC~~ SOPN
0.2500 mg | PEN_INJECTOR | SUBCUTANEOUS | 2 refills | Status: DC
  Filled 2021-04-23: qty 1.5, 56d supply, fill #0
  Filled 2021-06-03 – 2021-06-08 (×2): qty 1.5, 56d supply, fill #1
  Filled 2021-07-17: qty 1.5, 56d supply, fill #2

## 2021-04-24 ENCOUNTER — Other Ambulatory Visit: Payer: Self-pay | Admitting: Family Medicine

## 2021-04-24 ENCOUNTER — Other Ambulatory Visit (HOSPITAL_BASED_OUTPATIENT_CLINIC_OR_DEPARTMENT_OTHER): Payer: Self-pay

## 2021-04-27 ENCOUNTER — Other Ambulatory Visit (HOSPITAL_BASED_OUTPATIENT_CLINIC_OR_DEPARTMENT_OTHER): Payer: Self-pay

## 2021-04-28 ENCOUNTER — Other Ambulatory Visit: Payer: Self-pay

## 2021-04-28 ENCOUNTER — Encounter: Payer: Self-pay | Admitting: Podiatry

## 2021-04-28 ENCOUNTER — Ambulatory Visit: Payer: 59 | Admitting: Podiatry

## 2021-04-28 DIAGNOSIS — L84 Corns and callosities: Secondary | ICD-10-CM | POA: Diagnosis not present

## 2021-04-28 DIAGNOSIS — M0609 Rheumatoid arthritis without rheumatoid factor, multiple sites: Secondary | ICD-10-CM | POA: Diagnosis not present

## 2021-04-28 DIAGNOSIS — E1142 Type 2 diabetes mellitus with diabetic polyneuropathy: Secondary | ICD-10-CM

## 2021-04-29 ENCOUNTER — Other Ambulatory Visit: Payer: Self-pay | Admitting: Family Medicine

## 2021-04-29 ENCOUNTER — Encounter: Payer: Self-pay | Admitting: Family Medicine

## 2021-04-29 MED ORDER — CYCLOBENZAPRINE HCL 10 MG PO TABS
ORAL_TABLET | ORAL | 2 refills | Status: DC
  Filled 2021-04-29: qty 30, 30d supply, fill #0
  Filled 2021-07-15: qty 30, 30d supply, fill #1

## 2021-04-29 MED ORDER — DULOXETINE HCL 30 MG PO CPEP
30.0000 mg | ORAL_CAPSULE | Freq: Every day | ORAL | 1 refills | Status: DC
  Filled 2021-04-29: qty 90, 90d supply, fill #0

## 2021-04-30 ENCOUNTER — Other Ambulatory Visit (HOSPITAL_BASED_OUTPATIENT_CLINIC_OR_DEPARTMENT_OTHER): Payer: Self-pay

## 2021-05-02 ENCOUNTER — Encounter: Payer: Self-pay | Admitting: Family Medicine

## 2021-05-03 NOTE — Progress Notes (Signed)
Subjective:  Patient ID: [name], female    DOB: 09-Jun-1956,  MRN: [id number]  65 y.o. female presents preventative diabetic foot care and corn(s) b/l feet  which interfere(s) with ambulation. Aggravating factors include wearing enclosed shoe gear. Pain is relieved with periodic professional debridement.  Patient states blood glucose was 111 mg/dl today.   Patient states she has retired from [employer].   Patient states she did discuss bracing/AFO with Neurologist and was informed it may not be of benefit.   She is continuing Jublia to her nails daily.  PCP is Carollee Herter, Kendrick Fries R, DO , and last visit was 04/21/2021.  Allergies  Allergen Reactions   Isoniazid     Severe SOB   Morphine Other (See Comments), Nausea And Vomiting and Hives    severe vomiting severe vomiting  Other reaction(s): Vomiting    Nitrofurantoin Shortness Of Breath, Other (See Comments) and Rash    REACTION: welts REACTION: welts Other reaction(s): Unknown   Oxycodone Hcl Hives and Itching   Hctz [Hydrochlorothiazide]     rash   Macrolides And Ketolides     rash Other reaction(s): Unknown rash rash   Oseltamivir Phosphate Nausea And Vomiting    "I vomited within 30 minutes of taking it and was told I cannot ever take it again."   Promethazine Hcl Other (See Comments)    IF GIVEN  IV-hallucinations     CAN TAKE PO PHENERGAN Other reaction(s): Unknown   Sulfa Antibiotics Rash   Tamiflu [Oseltamivir Phosphate] Nausea And Vomiting    "I vomited within 30 minutes of taking it and was told I cannot ever take it again."   Wound Dressing Adhesive Rash    Adhesive Tape    Gabapentin     Pt states she has eye swelling    Oseltamivir     Other reaction(s): Unknown Other reaction(s): Unknown   Other Other (See Comments)    bandaids   Percocet [Oxycodone-Acetaminophen]     REACTION: severe itching. Can take by mouth codeine   Roxicet [Oxycodone-Acetaminophen]     itching    Sulfamethoxazole-Trimethoprim     Septra / Bactrim  --REACTION: rash Other reaction(s): Unknown   Fentanyl Hives and Rash    REACTION: welts    Review of Systems: Negative except as noted in the HPI.   Objective:  Vascular Examination: Capillary fill time to digits <3 seconds b/l lower extremities. Palpable DP pulse(s) b/l lower extremities Palpable PT pulse(s) b/l lower extremities Pedal hair absent. Lower extremity skin temperature gradient within normal limits. No pain with calf compression b/l. No edema noted b/l lower extremities.  Neurological Examination: Pt has subjective symptoms of neuropathy. Protective sensation intact 5/5 intact bilaterally with 10g monofilament b/l. Vibratory sensation intact b/l.  Dermatological Examination: Skin warm and supple b/l lower extremities. No open wounds b/l LE. No interdigital macerations b/l lower extremities. Toenails 1-5 bilaterally well maintained with adequate length. No erythema, no edema, no drainage, no fluctuance. Hyperkeratotic lesion(s) R 3rd toe.  No erythema, no edema, no drainage, no fluctuance.  Musculoskeletal Examination: Normal muscle strength 5/5 to all lower extremity muscle groups bilaterally. Mild inversion deformity right ankle. Clawtoe deformity R 3rd toe, R 4th toe, and R 5th toe.  Radiographs: None Assessment:   1. Corns   2. Diabetic peripheral neuropathy associated with type 2 diabetes mellitus ([place])    Plan:  -Examined patient. -She has improvement in toenails with use of Jublia. Continue applying to toenails daily. -Continue diabetic foot care  principles: inspect feet daily, monitor glucose as recommended by PCP and/or Endocrinologist, and follow prescribed diet per PCP, Endocrinologist and/or dietician. -Patient to continue soft, supportive shoe gear daily. -Corn(s) R 3rd toe pared utilizing sterile scalpel blade without complication or incident. Total number debrided=1. -Patient to report any pedal injuries  to medical professional immediately. -Patient/POA to call should there be question/concern in the interim.  Return in about 3 months (around 07/29/2021).  Marzetta Board, DPM

## 2021-05-04 ENCOUNTER — Telehealth: Payer: Self-pay | Admitting: *Deleted

## 2021-05-04 ENCOUNTER — Telehealth: Payer: Self-pay

## 2021-05-04 ENCOUNTER — Other Ambulatory Visit (HOSPITAL_BASED_OUTPATIENT_CLINIC_OR_DEPARTMENT_OTHER): Payer: Self-pay

## 2021-05-04 MED ORDER — TECHLITE PEN NEEDLES 32G X 6 MM MISC
2 refills | Status: DC
  Filled 2021-05-04: qty 100, 90d supply, fill #0
  Filled 2021-12-23: qty 100, 90d supply, fill #1
  Filled 2022-03-31: qty 100, 90d supply, fill #2

## 2021-05-04 NOTE — Telephone Encounter (Signed)
Call provided number to request a pre-op clearance form. No answer at this time. Left a message for them to return the call.

## 2021-05-04 NOTE — Telephone Encounter (Signed)
   Name: [name] DOB: 1956-05-25  MRN: [id number]  Primary Cardiologist: Glenetta Hew, MD  Chart reviewed as part of pre-operative protocol coverage.   66 year old female with a prior history of syncope, aortic valve sclerosis, left subclavian stenosis, hypertension, hyperlipidemia, orthostatic hypotension, rheumatoid arthritis, diabetes.    She was last seen by Dr. Ellyn Hack 01/09/2021.  Event monitor 08/2020: No arrhythmias Carotid US 07/30/2020: No significant carotid artery stenosis Echocardiogram 02/20/2020: EF 60-65, GR 1 DD, RVSP 37.8, AV sclerosis without stenosis  RCRI:  Perioperative Risk of Major Cardiac Event is (%): 0.4 (low risk) DASI:  Functional Capacity in METs is: 4.3 (functional status is fair )  Patient was contacted 05/04/2021 in reference to pre-operative risk assessment for pending surgery as outlined below.    Since last seen, [name] has done ok.  She had COVID-19 recently and has had some residual congestion and fatigue.  She has not had significant shortness of breath, chest pain, syncope.   Recommendations: Based on ACC/AHA guidelines, the patient is at acceptable risk for the planned procedure and may proceed without further cardiovascular testing.    Please call with questions. Richardson Dopp, PA-C 05/04/2021, 11:23 AM

## 2021-05-04 NOTE — Telephone Encounter (Signed)
   [facility] Pre-operative Risk Assessment    Patient Name: [name]  DOB: [birth year] MRN: [id number]    Request for surgical clearance:  What type of surgery is being performed?  L4-S1 DECOMP.FUSION AND TLIF  When is this surgery scheduled? 06/08/21  What type of clearance is required (medical clearance vs. Pharmacy clearance to hold med vs. Both)? MEDICAL   Are there any medications that need to be held prior to surgery and how long? KNOWN  Practice name and name of physician performing surgery? [facility]  What is the office phone number? [phone]   7.   What is the office fax number? [place]   8.   Anesthesia type (None, local, MAC, general) ? General    Raiford Simmonds 05/04/2021, 8:18 AM  _________________________________________________________________   (provider comments below)

## 2021-05-04 NOTE — Telephone Encounter (Signed)
Notes faxed to surgeon. This phone note will be removed from the preop pool. Richardson Dopp, PA-C  05/04/2021 11:29 AM

## 2021-05-05 ENCOUNTER — Encounter: Payer: 59 | Admitting: Family Medicine

## 2021-05-07 ENCOUNTER — Telehealth: Payer: Self-pay | Admitting: Neurology

## 2021-05-07 ENCOUNTER — Other Ambulatory Visit (HOSPITAL_BASED_OUTPATIENT_CLINIC_OR_DEPARTMENT_OTHER): Payer: Self-pay

## 2021-05-07 MED ORDER — EMGALITY 120 MG/ML ~~LOC~~ SOAJ
SUBCUTANEOUS | 5 refills | Status: DC
  Filled 2021-05-07: qty 1, 28d supply, fill #0
  Filled 2021-05-28 – 2021-06-03 (×2): qty 1, 28d supply, fill #1
  Filled 2021-07-08: qty 1, 28d supply, fill #2
  Filled 2021-08-04: qty 1, 28d supply, fill #3
  Filled 2021-08-28: qty 1, 28d supply, fill #4

## 2021-05-07 NOTE — Telephone Encounter (Signed)
Pt called in stating the pharmacy told her someone cancelled the [facility] prescription. She needs a new refill sent over to [facility].

## 2021-05-08 ENCOUNTER — Other Ambulatory Visit: Payer: Self-pay | Admitting: Family

## 2021-05-08 DIAGNOSIS — D649 Anemia, unspecified: Secondary | ICD-10-CM

## 2021-05-11 ENCOUNTER — Encounter: Payer: Self-pay | Admitting: Family

## 2021-05-11 ENCOUNTER — Inpatient Hospital Stay: Payer: 59 | Attending: Family | Admitting: Family

## 2021-05-11 ENCOUNTER — Other Ambulatory Visit: Payer: Self-pay

## 2021-05-11 ENCOUNTER — Inpatient Hospital Stay: Payer: 59

## 2021-05-11 VITALS — BP 167/66 | HR 84 | Temp 99.2°F | Resp 18 | Wt 214.0 lb

## 2021-05-11 DIAGNOSIS — D509 Iron deficiency anemia, unspecified: Secondary | ICD-10-CM | POA: Diagnosis not present

## 2021-05-11 DIAGNOSIS — E119 Type 2 diabetes mellitus without complications: Secondary | ICD-10-CM | POA: Insufficient documentation

## 2021-05-11 DIAGNOSIS — K912 Postsurgical malabsorption, not elsewhere classified: Secondary | ICD-10-CM | POA: Insufficient documentation

## 2021-05-11 DIAGNOSIS — Z803 Family history of malignant neoplasm of breast: Secondary | ICD-10-CM | POA: Insufficient documentation

## 2021-05-11 DIAGNOSIS — Z9884 Bariatric surgery status: Secondary | ICD-10-CM | POA: Insufficient documentation

## 2021-05-11 DIAGNOSIS — D508 Other iron deficiency anemias: Secondary | ICD-10-CM

## 2021-05-11 DIAGNOSIS — D649 Anemia, unspecified: Secondary | ICD-10-CM

## 2021-05-11 DIAGNOSIS — Z8616 Personal history of COVID-19: Secondary | ICD-10-CM | POA: Insufficient documentation

## 2021-05-11 DIAGNOSIS — I10 Essential (primary) hypertension: Secondary | ICD-10-CM | POA: Insufficient documentation

## 2021-05-11 DIAGNOSIS — Z90710 Acquired absence of both cervix and uterus: Secondary | ICD-10-CM | POA: Insufficient documentation

## 2021-05-11 LAB — CBC WITH DIFFERENTIAL (CANCER CENTER ONLY)
Abs Immature Granulocytes: 0.08 10*3/uL — ABNORMAL HIGH (ref 0.00–0.07)
Basophils Absolute: 0.1 10*3/uL (ref 0.0–0.1)
Basophils Relative: 1 %
Eosinophils Absolute: 0.1 10*3/uL (ref 0.0–0.5)
Eosinophils Relative: 1 %
HCT: 32.6 % — ABNORMAL LOW (ref 36.0–46.0)
Hemoglobin: 9.9 g/dL — ABNORMAL LOW (ref 12.0–15.0)
Immature Granulocytes: 1 %
Lymphocytes Relative: 26 %
Lymphs Abs: 2.4 10*3/uL (ref 0.7–4.0)
MCH: 23 pg — ABNORMAL LOW (ref 26.0–34.0)
MCHC: 30.4 g/dL (ref 30.0–36.0)
MCV: 75.8 fL — ABNORMAL LOW (ref 80.0–100.0)
Monocytes Absolute: 0.8 10*3/uL (ref 0.1–1.0)
Monocytes Relative: 9 %
Neutro Abs: 5.7 10*3/uL (ref 1.7–7.7)
Neutrophils Relative %: 62 %
Platelet Count: 264 10*3/uL (ref 150–400)
RBC: 4.3 MIL/uL (ref 3.87–5.11)
RDW: 15.1 % (ref 11.5–15.5)
WBC Count: 9.1 10*3/uL (ref 4.0–10.5)
nRBC: 0 % (ref 0.0–0.2)

## 2021-05-11 LAB — CMP (CANCER CENTER ONLY)
ALT: 24 U/L (ref 0–44)
AST: 25 U/L (ref 15–41)
Albumin: 4.2 g/dL (ref 3.5–5.0)
Alkaline Phosphatase: 70 U/L (ref 38–126)
Anion gap: 7 (ref 5–15)
BUN: 13 mg/dL (ref 8–23)
CO2: 27 mmol/L (ref 22–32)
Calcium: 9.5 mg/dL (ref 8.9–10.3)
Chloride: 100 mmol/L (ref 98–111)
Creatinine: 0.86 mg/dL (ref 0.44–1.00)
GFR, Estimated: >60 mL/min (ref >60)
Glucose, Bld: 159 mg/dL — ABNORMAL HIGH (ref 70–99)
Potassium: 3.7 mmol/L (ref 3.5–5.1)
Sodium: 134 mmol/L — ABNORMAL LOW (ref 135–145)
Total Bilirubin: 0.6 mg/dL (ref 0.3–1.2)
Total Protein: 6.8 g/dL (ref 6.5–8.1)

## 2021-05-11 LAB — LACTATE DEHYDROGENASE: LDH: 228 U/L — ABNORMAL HIGH (ref 98–192)

## 2021-05-11 LAB — RETICULOCYTES
Immature Retic Fract: 22.2 % — ABNORMAL HIGH (ref 2.3–15.9)
RBC.: 4.21 MIL/uL (ref 3.87–5.11)
Retic Count, Absolute: 53.9 10*3/uL (ref 19.0–186.0)
Retic Ct Pct: 1.3 % (ref 0.4–3.1)

## 2021-05-11 LAB — FOLATE: Folate: 18.1 ng/mL (ref >5.9)

## 2021-05-11 LAB — SAMPLE TO BLOOD BANK

## 2021-05-11 LAB — VITAMIN B12: Vitamin B-12: 207 pg/mL (ref 180–914)

## 2021-05-11 NOTE — Progress Notes (Signed)
Hematology/Oncology Consultation   Name: [name]      MRN: [id number]    Location: Room/bed info not found  Date: 05/11/2021 Time:3:58 PM   REFERRING PHYSICIAN:  Kendrick Fries C. 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 She failed lap band which was reversed in 2013 followed by gastric sleeve in 2014 which she also failed. She then had gastric bypass surgery in 2018.  Iron saturation in March of this year was 6% and ferritin 10.  She needs to have L4-S1 decompression and fusion with Dr. Sherlyn Lick which is scheduled for 06/08/2021. They would like her Hgb to be improved prior to surgery.  Hgb today is 9.9, MCV 75, platelets 264 and WBC count 9.1.  She has not noted any blood loss. She is bruising easily on her arms. No petechiae noted.  She had no improvement with oral iron.  She is symptomatic with fatigue, dry cracked lips, fingers and toes cold, chewing/craving ice, brain fog, palpitations, dizziness and near syncope.  No known family history of anemia.  She did have Covid in September and received monoclonal antibody infusions.  She notes that her hypertension is not always well controlled. She sees Dr. Ellyn Hack with cardiology who manages her antihypertensives and she states she will be having a bilateral carotid duplex in January.  She has history of migraines.  She has significant history of RA and OA. She states that she failed generic Revlimid due to developing a heart murmur. She is now on Plaquenil daily and Acetemra injection once a month. Her surgeon is having her hold the [place] for a month prior to surgery. She will hold the plaquenil for 3 days prior to surgery.  Colonoscopy in 2019 was negative. 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 No other personal history of cancer. Her mother had history of breast cancer.  Mammogram in January 2022 was negative.  She states that she had an episode where she passed out and was incontinent of urine. Her neurologist feels she may have had a petit mal seizure. She states that if she has another episode he will admit her for further testing.  She is diabetic and is currently on insuline.  No history of thyroid disease.  No fever, n/v, cough, rash, chest pain, SOB, abdominal pain or changes in bowel or bladder habits.  She states that her appetite has been down due to her taste being off but she is doing her best to stay well hydrated. Her weight is stable at 214 lbs.  No smoking, ETOH or recreational drug use.  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 She has since retired due to her health.   ROS: All other 10 point review of systems is negative.   PAST MEDICAL HISTORY:   Past Medical History:  Diagnosis Date   Allergic rhinitis    Anxiety    Asthma    hx bronchial asthma at times with upper resp infection   Depression    Fibromyalgia    GERD (gastroesophageal reflux disease)    Headache(784.0)    migraine and cluster headaches   Hyperlipemia    Hypertension    IBS (irritable bowel syndrome)    Menopause  Neuromuscular disorder ([place]) 2009   hx of fibromyalgia, polyarthralsia (surgery induced)   Osteoarthritis    Rheumatoid arthritis ([place])    Complicated by osteoarthritis as well.   Rheumatoid arthritis ([place])    Stress incontinence    at times   Tibial fracture 10/14/2016   evulsion periostial right   Type 2 diabetes mellitus with complication, without long-term current use of insulin (HCC)    diet controlled no meds -> however was diagnosed with a diabetic foot ulcer.    ALLERGIES: Allergies  Allergen Reactions   Isoniazid     Severe SOB   Morphine Other (See Comments), Nausea And Vomiting and Hives     severe vomiting severe vomiting  Other reaction(s): Vomiting    Nitrofurantoin Shortness Of Breath, Other (See Comments) and Rash    REACTION: welts REACTION: welts Other reaction(s): Unknown   Oxycodone Hcl Hives and Itching   Hctz [Hydrochlorothiazide]     rash   Macrolides And Ketolides     rash Other reaction(s): Unknown rash rash   Oseltamivir Phosphate Nausea And Vomiting    "I vomited within 30 minutes of taking it and was told I cannot ever take it again."   Promethazine Hcl Other (See Comments)    IF GIVEN  IV-hallucinations     CAN TAKE PO PHENERGAN Other reaction(s): Unknown   Sulfa Antibiotics Rash   Tamiflu [Oseltamivir Phosphate] Nausea And Vomiting    "I vomited within 30 minutes of taking it and was told I cannot ever take it again."   Wound Dressing Adhesive Rash    Adhesive Tape    Gabapentin     Pt states she has eye swelling    Oseltamivir     Other reaction(s): Unknown Other reaction(s): Unknown   Other Other (See Comments)    bandaids   Percocet [Oxycodone-Acetaminophen]     REACTION: severe itching. Can take by mouth codeine   Roxicet [Oxycodone-Acetaminophen]     itching   Sulfamethoxazole-Trimethoprim     Septra / Bactrim  --REACTION: rash Other reaction(s): Unknown   Fentanyl Hives and Rash    REACTION: welts      MEDICATIONS:  Current Outpatient Medications on File Prior to Visit  Medication Sig Dispense Refill   acetaminophen (TYLENOL) 650 MG CR tablet Take 1,300 mg by mouth as needed.     albuterol (VENTOLIN HFA) 108 (90 Base) MCG/ACT inhaler Inhale 2 puffs by mouth into the lungs every 6 (six) hours as needed for wheezing or shortness of breath. 18 g 0   Ca Phosphate-Cholecalciferol (CALTRATE GUMMY BITES PO) Take 2 each 2 (two) times daily by mouth.      Cholecalciferol 125 MCG (5000 UT) TABS take 1 tablet by oral route every day     conjugated estrogens (PREMARIN) vaginal cream INSERT 1 GRAM OF CREAM PER VAGINA THREE TIMES A WEEK  BY VAGINAL ROUTE. 30 g 5   COVID-19 At Home Antigen Test (CARESTART COVID-19 HOME TEST) KIT Use as directed per package instructions. (Patient not taking: Reported on 04/21/2021) 2 kit 0   cyclobenzaprine (FLEXERIL) 10 MG tablet TAKE ONE TABLET (10 MG DOSE) BY MOUTH AT BEDTIME AS NEEDED. 30 tablet 2   diclofenac Sodium (VOLTAREN) 1 % GEL Apply 2 g topically See admin instructions.     DULoxetine (CYMBALTA) 30 MG capsule 30 mg 2 (two) times daily. 2 TABS BID     Efinaconazole (JUBLIA) 10 % SOLN Apply to affected toenails once daily for 48 weeks. 8 mL 11  estradiol (VIVELLE-DOT) 0.1 MG/24HR Place 1 patch (0.1 mg total) onto the skin 2 (two) times a week. Monday and Thursday 8 patch 11   fexofenadine (ALLEGRA ALLERGY) 180 MG tablet Take 1 tablet (180 mg total) by mouth daily. 30 tablet 0   fluticasone (FLOVENT HFA) 110 MCG/ACT inhaler Inhale 2 puffs by mouth into the lungs 2 (two) times daily. 12 g 12   furosemide (LASIX) 20 MG tablet TAKE 1 TABLET BY MOUTH EVERY MORNING 90 tablet 1   Galcanezumab-gnlm (EMGALITY) 120 MG/ML SOAJ INJECT 120 MG INTO THE SKIN EVERY 28 (TWENTY-EIGHT) DAYS. 1 mL 5   glucose blood test strip Test blood sugar once daily. Dx Code: E11.9 100 each 12   hydrALAZINE (APRESOLINE) 25 MG tablet Take 1 tablet (25 mg total) by mouth 2 (two) times daily. Hold if feeling dizzy or SBP<110 mmHg).  Can use additional 25 mg as needed for SBP>150 mmHg 180 tablet 3   HYDROcodone-acetaminophen (NORCO/VICODIN) 5-325 MG tablet Take 1 tablet by mouth every 6 (six) hours as needed.     hydroxychloroquine (PLAQUENIL) 200 MG tablet Take 2 tablets by mouth once daily 60 tablet 3   ID NOW COVID-19 KIT TEST AS DIRECTED TODAY     Insulin Pen Needle (TECHLITE PEN NEEDLES) 32G X 6 MM MISC Use with Ozempic as directed 100 each 2   meclizine (ANTIVERT) 25 MG tablet Take 1 tablet (25 mg total) by mouth 3 (three) times daily as needed for dizziness. 20 tablet 0   methocarbamol (ROBAXIN) 500 MG tablet TAKE 1  TABLET BY MOUTH EVERY 6 HOURS AS NEEDED FOR MUSCLE SPASMS 120 tablet 0   Multiple Vitamins-Minerals (CENTRUM ADULTS) TABS take 2 tablets by oral route  every day with food     NONFORMULARY OR COMPOUNDED ITEM bp cuff  Dx hypertension 1 each 0   nystatin (MYCOSTATIN/NYSTOP) powder APPLY 1 APPLICATION TOPICALLY 3 (THREE) TIMES DAILY. 15 g 5   olmesartan (BENICAR) 20 MG tablet Take 1 tablet (20 mg total) by mouth daily with supper. 90 tablet 3   pantoprazole (PROTONIX) 40 MG tablet Take 1 tablet (40 mg total) by mouth daily. 90 tablet 3   potassium chloride (KLOR-CON) 10 MEQ tablet TAKE 1 TABLET (10 MEQ TOTAL) BY MOUTH DAILY. 90 tablet 1   promethazine-codeine (PHENERGAN WITH CODEINE) 6.25-10 MG/5ML syrup Take 5 mLs by mouth every 6 (six) hours as needed for cough. 120 mL 0   rosuvastatin (CRESTOR) 20 MG tablet TAKE 1 TABLET BY MOUTH ONCE DAILY 90 tablet 1   Semaglutide,0.25 or 0.5MG/DOS, (OZEMPIC, 0.25 OR 0.5 MG/DOSE,) 2 MG/1.5ML SOPN Inject 0.25 mg into the skin once a week. 1.5 mL 2   temazepam (RESTORIL) 30 MG capsule Take by mouth.     thiamine (VITAMIN B-1) 100 MG tablet Take 100 mg daily by mouth.     tocilizumab (ACTEMRA) 400 MG/20ML SOLN injection Inject 400 mg into the vein every 30 (thirty) days.     Ubrogepant (UBRELVY) 100 MG TABS Take by mouth.     No current facility-administered medications on file prior to visit.     PAST SURGICAL HISTORY Past Surgical History:  Procedure Laterality Date   ABDOMINAL HYSTERECTOMY  12/1999   complete   CARPAL TUNNEL RELEASE Right 12/27/2017   Procedure: CARPAL TUNNEL RELEASE;  Surgeon: Roseanne Kaufman, MD;  Location: [place];  Service: Orthopedics;  Laterality: Right;   CERVICAL CONIZATION W/BX  june 1990   dysplasia of cervix/used 5Fu cream for 3 months  CERVICAL LAMINECTOMY  2005 & 2009   X 2   NO ROM PROBLEMS   CESAREAN SECTION  1989 & 1993   X 2   CHOLECYSTECTOMY  1986   DILATION AND CURETTAGE OF UTERUS   april-1989   missed abortion    ESOPHAGOGASTRODUODENOSCOPY  06/29/2011   Procedure: ESOPHAGOGASTRODUODENOSCOPY (EGD);  Surgeon: Shann Medal, MD;  Location: [facility];  Service: General;  Laterality: N/A;   FOOT SURGERY  2005   RT HEEL   GASTRIC BANDING PORT REVISION  09/24/2011   Procedure: GASTRIC BANDING PORT REVISION;  Surgeon: Pedro Earls, MD;  Location: [facility];  Service: General;  Laterality: N/A;   GASTRIC ROUX-EN-Y N/A 06/06/2017   Procedure: Conversion from Sleeve to [place];  Surgeon: Johnathan Hausen, MD;  Location: [facility];  Service: General;  Laterality: N/A;   [place]  12/30/09   LAPAROSCOPIC GASTRIC SLEEVE RESECTION N/A 07/02/2013   Procedure: LAPAROSCOPIC GASTRIC SLEEVE RESECTION upper endoscopy;  Surgeon: Pedro Earls, MD;  Location: [facility];  Service: General;  Laterality: N/A;   LAPAROSCOPIC REPAIR AND REMOVAL OF GASTRIC BAND N/A 07/02/2013   Procedure: LAPAROSCOPIC REMOVAL OF GASTRIC BAND;  Surgeon: Pedro Earls, MD;  Location: [facility];  Service: General;  Laterality: N/A;   LAPAROSCOPIC REVISION OF GASTRIC BAND  07/03/2012   Procedure: LAPAROSCOPIC REVISION OF GASTRIC BAND;  Surgeon: Pedro Earls, MD;  Location: [facility];  Service: General;  Laterality: N/A;  removal of old lap. band port, replaced with AP standard band   LAPAROSCOPY  09/24/2011   Procedure: LAPAROSCOPY DIAGNOSTIC;  Surgeon: Pedro Earls, MD;  Location: [facility];  Service: General;  Laterality: N/A;   LAPAROSCOPY  07/03/2012   Procedure: LAPAROSCOPY DIAGNOSTIC;  Surgeon: Pedro Earls, MD;  Location: [facility];  Service: General;  Laterality: N/A;  Exploratory Laparoscopy    MESH APPLIED TO LAP PORT  07/03/2012   Procedure: MESH APPLIED TO LAP PORT;  Surgeon: Pedro Earls, MD;  Location: [facility];  Service: General;  Laterality: N/A;   [place]   X 2   right knee  1981   ARTHROSCOPY AND ARTHROTOMY    right knee arthroscopy and arthrotomy  12-1979   [place]   WITH C -SECTION    FAMILY HISTORY: Family History  Problem Relation Age of Onset   Diabetes Mother    Stroke Mother    Breast cancer Mother    Atrial fibrillation Mother    Hypertension Mother    Cancer Mother 51       breast   Diabetes Father    Stroke Father 67       2nd 74 month apart   Hypertension Father    Heart attack Father 31       stents   Dementia Father    AAA (abdominal aortic aneurysm) Father 32       repair   CAD Father 61   Thyroid disease Other    Heart disease Other    COPD Other     SOCIAL HISTORY:  reports that she has never smoked. She has never used smokeless tobacco. She reports that she does not currently use alcohol. She reports that she does not use drugs.  PERFORMANCE STATUS: The patient's performance status is 1 - Symptomatic but completely ambulatory  PHYSICAL EXAM: Most Recent Vital  Signs: Blood pressure (!) 167/66, pulse 84, temperature 99.2 F (37.3 C), temperature source Oral, resp. rate 18, weight 214 lb (97.1 kg), SpO2 98 %. BP (!) 167/66 (Patient Position: Sitting)   Pulse 84   Temp 99.2 F (37.3 C) (Oral)   Resp 18   Wt 214 lb (97.1 kg)   SpO2 98%   BMI 29.85 kg/m   General Appearance:    Alert, cooperative, no distress, appears stated age  Head:    Normocephalic, without obvious abnormality, atraumatic  Eyes:    PERRL, conjunctiva/corneas clear, EOM's intact, fundi    benign, both eyes        Throat:   Lips, mucosa, and tongue normal; teeth and gums normal  Neck:   Supple, symmetrical, trachea midline, no adenopathy;    thyroid:  no enlargement/tenderness/nodules; no carotid   bruit or JVD  Back:     Symmetric, no curvature, ROM normal, no CVA tenderness  Lungs:     Clear to auscultation bilaterally, respirations unlabored  Chest Wall:    No tenderness or deformity   Heart:    Regular rate and rhythm, S1 and S2 normal, no  murmur, rub   or gallop     Abdomen:     Soft, non-tender, bowel sounds active all four quadrants,    no masses, no organomegaly        Extremities:   Extremities normal, atraumatic, no cyanosis or edema  Pulses:   2+ and symmetric all extremities  Skin:   Skin color, texture, turgor normal, no rashes or lesions  Lymph nodes:   Cervical, supraclavicular, and axillary nodes normal  Neurologic:   CNII-XII intact, normal strength, sensation and reflexes    throughout    LABORATORY DATA:  Results for orders placed or performed in visit on 05/11/21 (from the past 48 hour(s))  CBC with Differential ([place] Only)     Status: Abnormal   Collection Time: 05/11/21  2:35 PM  Result Value Ref Range   WBC Count 9.1 4.0 - 10.5 K/uL   RBC 4.30 3.87 - 5.11 MIL/uL   Hemoglobin 9.9 (L) 12.0 - 15.0 g/dL    Comment: Reticulocyte Hemoglobin testing may be clinically indicated, consider ordering this additional test [id number]    HCT 32.6 (L) 36.0 - 46.0 %   MCV 75.8 (L) 80.0 - 100.0 fL   MCH 23.0 (L) 26.0 - 34.0 pg   MCHC 30.4 30.0 - 36.0 g/dL   RDW 15.1 11.5 - 15.5 %   Platelet Count 264 150 - 400 K/uL   nRBC 0.0 0.0 - 0.2 %   Neutrophils Relative % 62 %   Neutro Abs 5.7 1.7 - 7.7 K/uL   Lymphocytes Relative 26 %   Lymphs Abs 2.4 0.7 - 4.0 K/uL   Monocytes Relative 9 %   Monocytes Absolute 0.8 0.1 - 1.0 K/uL   Eosinophils Relative 1 %   Eosinophils Absolute 0.1 0.0 - 0.5 K/uL   Basophils Relative 1 %   Basophils Absolute 0.1 0.0 - 0.1 K/uL   Immature Granulocytes 1 %   Abs Immature Granulocytes 0.08 (H) 0.00 - 0.07 K/uL    Comment: Performed at [facility] at [facility], [address] [place], [place] [address]  CMP ([place] only)     Status: Abnormal   Collection Time: 05/11/21  2:35 PM  Result Value Ref Range   Sodium 134 (L) 135 - 145 mmol/L   Potassium 3.7 3.5 -  5.1 mmol/L   Chloride 100 98 - 111 mmol/L   CO2 27 22 - 32 mmol/L   Glucose,  Bld 159 (H) 70 - 99 mg/dL    Comment: Glucose reference range applies only to samples taken after fasting for at least 8 hours.   BUN 13 8 - 23 mg/dL   Creatinine 0.86 0.44 - 1.00 mg/dL   Calcium 9.5 8.9 - 10.3 mg/dL   Total Protein 6.8 6.5 - 8.1 g/dL   Albumin 4.2 3.5 - 5.0 g/dL   AST 25 15 - 41 U/L   ALT 24 0 - 44 U/L   Alkaline Phosphatase 70 38 - 126 U/L   Total Bilirubin 0.6 0.3 - 1.2 mg/dL   GFR, Estimated >60 >60 mL/min    Comment: (NOTE) Calculated using the CKD-EPI Creatinine Equation (2021)    Anion gap 7 5 - 15    Comment: Performed at [facility] at [facility], [address], [place], [place] [address]  Lactate dehydrogenase (LDH)     Status: Abnormal   Collection Time: 05/11/21  2:36 PM  Result Value Ref Range   LDH 228 (H) 98 - 192 U/L    Comment: Performed at [facility] at [facility], [address] [place], [place] [address]  Reticulocytes     Status: Abnormal   Collection Time: 05/11/21  2:36 PM  Result Value Ref Range   Retic Ct Pct 1.3 0.4 - 3.1 %   RBC. 4.21 3.87 - 5.11 MIL/uL   Retic Count, Absolute 53.9 19.0 - 186.0 K/uL   Immature Retic Fract 22.2 (H) 2.3 - 15.9 %    Comment: Performed at [facility] at [facility], [address] [place], [place] [address]      RADIOGRAPHY: No results found.     PATHOLOGY: None   ASSESSMENT/PLAN: [name] is a very pleasant 65 yo caucasian female with anemia secondary to malabsorption s/p gastric bypass in 2018.  She needs to have back surgery at the end of November so we will do everything we can to make sure her counts are up for her by then.  Anemia work up is pending. We will get her set up for IV iron and B12/folate replacement if needed.  Epo level is also pending.  Follow-up in 8 weeks to re evaluate.    All questions were answered. The patient knows to call the clinic with any problems, questions or concerns. We can  certainly see the patient much sooner if necessary.   Lottie Dawson, NP

## 2021-05-12 ENCOUNTER — Other Ambulatory Visit: Payer: Self-pay | Admitting: Family

## 2021-05-12 ENCOUNTER — Telehealth: Payer: Self-pay | Admitting: *Deleted

## 2021-05-12 ENCOUNTER — Telehealth: Payer: Self-pay

## 2021-05-12 ENCOUNTER — Encounter: Payer: Self-pay | Admitting: Family

## 2021-05-12 LAB — IRON AND TIBC
Iron: 21 ug/dL — ABNORMAL LOW (ref 41–142)
Saturation Ratios: 4 % — ABNORMAL LOW (ref 21–57)
TIBC: 538 ug/dL — ABNORMAL HIGH (ref 236–444)
UIBC: 517 ug/dL — ABNORMAL HIGH (ref 120–384)

## 2021-05-12 LAB — FERRITIN: Ferritin: 13 ng/mL (ref 11–307)

## 2021-05-12 LAB — ERYTHROPOIETIN: Erythropoietin: 35.3 m[IU]/mL — ABNORMAL HIGH (ref 2.6–18.5)

## 2021-05-12 NOTE — Telephone Encounter (Signed)
-----   Message from Celso Amy, NP sent at 05/12/2021  4:28 PM EDT ----- We are working on approval for Monoferric. Insurance is pending. In the mean time, she can go ahead and start taking 1 sublingual B 12 tablet supplement daily. She can get these over the counter! B 12 was on the low end of normal!  Sarah  ----- Message ----- From: Buel Ream, Lab [facility] [place] Sent: 05/11/2021   2:56 PM EDT To: Celso Amy, NP

## 2021-05-12 NOTE — Telephone Encounter (Signed)
Per 05/11/21 los - called and gave upcoming appointment - confirmed

## 2021-05-15 ENCOUNTER — Encounter: Payer: Self-pay | Admitting: Family

## 2021-05-18 ENCOUNTER — Other Ambulatory Visit: Payer: Self-pay | Admitting: Family

## 2021-05-18 DIAGNOSIS — D509 Iron deficiency anemia, unspecified: Secondary | ICD-10-CM | POA: Insufficient documentation

## 2021-05-19 ENCOUNTER — Telehealth: Payer: Self-pay | Admitting: *Deleted

## 2021-05-19 NOTE — Telephone Encounter (Signed)
Per staff message Tiffany - called and gave upcoming appointment - confirmed (1) dose of Monoferric IV iron

## 2021-05-21 ENCOUNTER — Inpatient Hospital Stay: Payer: 59 | Attending: Family

## 2021-05-21 ENCOUNTER — Other Ambulatory Visit: Payer: Self-pay

## 2021-05-21 VITALS — BP 126/54 | HR 73 | Temp 97.7°F | Resp 18

## 2021-05-21 DIAGNOSIS — D509 Iron deficiency anemia, unspecified: Secondary | ICD-10-CM

## 2021-05-21 DIAGNOSIS — D508 Other iron deficiency anemias: Secondary | ICD-10-CM | POA: Diagnosis not present

## 2021-05-21 MED ORDER — SODIUM CHLORIDE 0.9 % IV SOLN
1000.0000 mg | Freq: Once | INTRAVENOUS | Status: AC
  Administered 2021-05-21: 1000 mg via INTRAVENOUS
  Filled 2021-05-21: qty 10

## 2021-05-21 MED ORDER — SODIUM CHLORIDE 0.9 % IV SOLN
Freq: Once | INTRAVENOUS | Status: AC
Start: 2021-05-21 — End: 2021-05-21

## 2021-05-21 NOTE — Patient Instructions (Signed)

## 2021-05-22 ENCOUNTER — Other Ambulatory Visit (HOSPITAL_BASED_OUTPATIENT_CLINIC_OR_DEPARTMENT_OTHER): Payer: Self-pay

## 2021-05-28 ENCOUNTER — Other Ambulatory Visit (HOSPITAL_BASED_OUTPATIENT_CLINIC_OR_DEPARTMENT_OTHER): Payer: Self-pay

## 2021-05-29 ENCOUNTER — Telehealth: Payer: Self-pay | Admitting: Family Medicine

## 2021-05-29 DIAGNOSIS — Z4689 Encounter for fitting and adjustment of other specified devices: Secondary | ICD-10-CM | POA: Diagnosis not present

## 2021-05-29 DIAGNOSIS — M48062 Spinal stenosis, lumbar region with neurogenic claudication: Secondary | ICD-10-CM | POA: Diagnosis not present

## 2021-05-29 DIAGNOSIS — M4726 Other spondylosis with radiculopathy, lumbar region: Secondary | ICD-10-CM | POA: Diagnosis not present

## 2021-05-29 DIAGNOSIS — M4316 Spondylolisthesis, lumbar region: Secondary | ICD-10-CM | POA: Diagnosis not present

## 2021-05-29 DIAGNOSIS — M797 Fibromyalgia: Secondary | ICD-10-CM | POA: Diagnosis not present

## 2021-05-29 DIAGNOSIS — G8929 Other chronic pain: Secondary | ICD-10-CM | POA: Diagnosis not present

## 2021-05-29 DIAGNOSIS — M5431 Sciatica, right side: Secondary | ICD-10-CM | POA: Diagnosis not present

## 2021-05-29 DIAGNOSIS — Z01812 Encounter for preprocedural laboratory examination: Secondary | ICD-10-CM | POA: Diagnosis not present

## 2021-05-29 DIAGNOSIS — M4306 Spondylolysis, lumbar region: Secondary | ICD-10-CM | POA: Diagnosis not present

## 2021-06-01 ENCOUNTER — Other Ambulatory Visit: Payer: Self-pay | Admitting: Family

## 2021-06-02 ENCOUNTER — Encounter: Payer: Self-pay | Admitting: Family

## 2021-06-02 ENCOUNTER — Ambulatory Visit: Payer: 59 | Admitting: Cardiology

## 2021-06-03 ENCOUNTER — Other Ambulatory Visit (HOSPITAL_BASED_OUTPATIENT_CLINIC_OR_DEPARTMENT_OTHER): Payer: Self-pay

## 2021-06-03 ENCOUNTER — Other Ambulatory Visit: Payer: Self-pay

## 2021-06-03 MED ORDER — ESTRADIOL 0.1 MG/24HR TD PTTW
1.0000 | MEDICATED_PATCH | TRANSDERMAL | 2 refills | Status: DC
  Filled 2021-06-03: qty 24, 84d supply, fill #0

## 2021-06-08 ENCOUNTER — Other Ambulatory Visit (HOSPITAL_BASED_OUTPATIENT_CLINIC_OR_DEPARTMENT_OTHER): Payer: Self-pay

## 2021-06-08 DIAGNOSIS — M48062 Spinal stenosis, lumbar region with neurogenic claudication: Secondary | ICD-10-CM | POA: Diagnosis not present

## 2021-06-08 DIAGNOSIS — M7130 Other bursal cyst, unspecified site: Secondary | ICD-10-CM | POA: Diagnosis not present

## 2021-06-08 DIAGNOSIS — M5416 Radiculopathy, lumbar region: Secondary | ICD-10-CM | POA: Diagnosis not present

## 2021-06-08 DIAGNOSIS — M5431 Sciatica, right side: Secondary | ICD-10-CM | POA: Diagnosis not present

## 2021-06-08 DIAGNOSIS — M4326 Fusion of spine, lumbar region: Secondary | ICD-10-CM | POA: Diagnosis not present

## 2021-06-08 DIAGNOSIS — M4726 Other spondylosis with radiculopathy, lumbar region: Secondary | ICD-10-CM | POA: Diagnosis not present

## 2021-06-08 DIAGNOSIS — Z981 Arthrodesis status: Secondary | ICD-10-CM | POA: Diagnosis not present

## 2021-06-08 DIAGNOSIS — M4317 Spondylolisthesis, lumbosacral region: Secondary | ICD-10-CM | POA: Diagnosis not present

## 2021-06-08 DIAGNOSIS — M47816 Spondylosis without myelopathy or radiculopathy, lumbar region: Secondary | ICD-10-CM | POA: Diagnosis not present

## 2021-06-08 DIAGNOSIS — M4316 Spondylolisthesis, lumbar region: Secondary | ICD-10-CM | POA: Diagnosis not present

## 2021-06-08 DIAGNOSIS — G8918 Other acute postprocedural pain: Secondary | ICD-10-CM | POA: Diagnosis not present

## 2021-06-09 ENCOUNTER — Emergency Department (HOSPITAL_BASED_OUTPATIENT_CLINIC_OR_DEPARTMENT_OTHER): Payer: 59

## 2021-06-09 ENCOUNTER — Encounter (HOSPITAL_BASED_OUTPATIENT_CLINIC_OR_DEPARTMENT_OTHER): Payer: Self-pay | Admitting: Urology

## 2021-06-09 ENCOUNTER — Other Ambulatory Visit (HOSPITAL_BASED_OUTPATIENT_CLINIC_OR_DEPARTMENT_OTHER): Payer: Self-pay

## 2021-06-09 ENCOUNTER — Emergency Department (HOSPITAL_BASED_OUTPATIENT_CLINIC_OR_DEPARTMENT_OTHER)
Admission: EM | Admit: 2021-06-09 | Discharge: 2021-06-09 | Disposition: A | Discharge status: Home / Self Care | Payer: 59 | Attending: Emergency Medicine | Admitting: Emergency Medicine

## 2021-06-09 DIAGNOSIS — E1140 Type 2 diabetes mellitus with diabetic neuropathy, unspecified: Secondary | ICD-10-CM | POA: Insufficient documentation

## 2021-06-09 DIAGNOSIS — I10 Essential (primary) hypertension: Secondary | ICD-10-CM | POA: Diagnosis not present

## 2021-06-09 DIAGNOSIS — Z8616 Personal history of COVID-19: Secondary | ICD-10-CM | POA: Diagnosis not present

## 2021-06-09 DIAGNOSIS — G8918 Other acute postprocedural pain: Secondary | ICD-10-CM | POA: Insufficient documentation

## 2021-06-09 DIAGNOSIS — R55 Syncope and collapse: Secondary | ICD-10-CM | POA: Insufficient documentation

## 2021-06-09 DIAGNOSIS — D649 Anemia, unspecified: Secondary | ICD-10-CM | POA: Insufficient documentation

## 2021-06-09 DIAGNOSIS — M2578 Osteophyte, vertebrae: Secondary | ICD-10-CM | POA: Diagnosis not present

## 2021-06-09 DIAGNOSIS — M545 Low back pain, unspecified: Secondary | ICD-10-CM | POA: Diagnosis not present

## 2021-06-09 DIAGNOSIS — Z79899 Other long term (current) drug therapy: Secondary | ICD-10-CM | POA: Diagnosis not present

## 2021-06-09 DIAGNOSIS — E860 Dehydration: Secondary | ICD-10-CM | POA: Insufficient documentation

## 2021-06-09 DIAGNOSIS — Z794 Long term (current) use of insulin: Secondary | ICD-10-CM | POA: Diagnosis not present

## 2021-06-09 DIAGNOSIS — I951 Orthostatic hypotension: Secondary | ICD-10-CM

## 2021-06-09 DIAGNOSIS — W1839XA Other fall on same level, initial encounter: Secondary | ICD-10-CM | POA: Insufficient documentation

## 2021-06-09 DIAGNOSIS — Z7951 Long term (current) use of inhaled steroids: Secondary | ICD-10-CM | POA: Diagnosis not present

## 2021-06-09 DIAGNOSIS — Z043 Encounter for examination and observation following other accident: Secondary | ICD-10-CM | POA: Diagnosis not present

## 2021-06-09 DIAGNOSIS — J45909 Unspecified asthma, uncomplicated: Secondary | ICD-10-CM | POA: Diagnosis not present

## 2021-06-09 DIAGNOSIS — R531 Weakness: Secondary | ICD-10-CM | POA: Diagnosis not present

## 2021-06-09 LAB — COMPREHENSIVE METABOLIC PANEL
ALT: 41 U/L (ref 0–44)
AST: 37 U/L (ref 15–41)
Albumin: 3.5 g/dL (ref 3.5–5.0)
Alkaline Phosphatase: 56 U/L (ref 38–126)
Anion gap: 7 (ref 5–15)
BUN: 10 mg/dL (ref 8–23)
CO2: 25 mmol/L (ref 22–32)
Calcium: 8.6 mg/dL — ABNORMAL LOW (ref 8.9–10.3)
Chloride: 101 mmol/L (ref 98–111)
Creatinine, Ser: 0.88 mg/dL (ref 0.44–1.00)
GFR, Estimated: >60 mL/min (ref >60)
Glucose, Bld: 156 mg/dL — ABNORMAL HIGH (ref 70–99)
Potassium: 4.1 mmol/L (ref 3.5–5.1)
Sodium: 133 mmol/L — ABNORMAL LOW (ref 135–145)
Total Bilirubin: 0.3 mg/dL (ref 0.3–1.2)
Total Protein: 6.1 g/dL — ABNORMAL LOW (ref 6.5–8.1)

## 2021-06-09 LAB — CBC WITH DIFFERENTIAL/PLATELET
Abs Immature Granulocytes: 0.03 10*3/uL (ref 0.00–0.07)
Basophils Absolute: 0.1 10*3/uL (ref 0.0–0.1)
Basophils Relative: 1 %
Eosinophils Absolute: 0.1 10*3/uL (ref 0.0–0.5)
Eosinophils Relative: 0 %
HCT: 30.4 % — ABNORMAL LOW (ref 36.0–46.0)
Hemoglobin: 9.4 g/dL — ABNORMAL LOW (ref 12.0–15.0)
Immature Granulocytes: 0 %
Lymphocytes Relative: 16 %
Lymphs Abs: 1.8 10*3/uL (ref 0.7–4.0)
MCH: 25.3 pg — ABNORMAL LOW (ref 26.0–34.0)
MCHC: 30.9 g/dL (ref 30.0–36.0)
MCV: 81.9 fL (ref 80.0–100.0)
Monocytes Absolute: 1 10*3/uL (ref 0.1–1.0)
Monocytes Relative: 9 %
Neutro Abs: 8.3 10*3/uL — ABNORMAL HIGH (ref 1.7–7.7)
Neutrophils Relative %: 74 %
Platelets: 159 10*3/uL (ref 150–400)
RBC: 3.71 MIL/uL — ABNORMAL LOW (ref 3.87–5.11)
RDW: 20.8 % — ABNORMAL HIGH (ref 11.5–15.5)
WBC: 11.3 10*3/uL — ABNORMAL HIGH (ref 4.0–10.5)
nRBC: 0 % (ref 0.0–0.2)

## 2021-06-09 LAB — CBG MONITORING, ED: Glucose-Capillary: 147 mg/dL — ABNORMAL HIGH (ref 70–99)

## 2021-06-09 MED ORDER — LACTATED RINGERS IV BOLUS
1000.0000 mL | Freq: Once | INTRAVENOUS | Status: AC
  Administered 2021-06-09: 1000 mL via INTRAVENOUS

## 2021-06-09 MED ORDER — DEXAMETHASONE SODIUM PHOSPHATE 10 MG/ML IJ SOLN
8.0000 mg | Freq: Once | INTRAMUSCULAR | Status: AC
  Administered 2021-06-09: 8 mg via INTRAVENOUS

## 2021-06-09 MED ORDER — HYDROCODONE-ACETAMINOPHEN 5-325 MG PO TABS
1.0000 | ORAL_TABLET | Freq: Once | ORAL | Status: AC
  Administered 2021-06-09: 1 via ORAL

## 2021-06-09 MED ORDER — FENTANYL CITRATE PF 50 MCG/ML IJ SOSY: 50.0000 ug | PREFILLED_SYRINGE | Freq: Once | INTRAMUSCULAR | Status: DC

## 2021-06-09 MED ORDER — CYCLOBENZAPRINE HCL 10 MG PO TABS
10.0000 mg | ORAL_TABLET | Freq: Every day | ORAL | 0 refills | Status: DC
  Filled 2021-06-09: qty 30, 30d supply, fill #0

## 2021-06-09 MED ORDER — HYDROCODONE-ACETAMINOPHEN 5-325 MG PO TABS
1.0000 | ORAL_TABLET | Freq: Four times a day (QID) | ORAL | 0 refills | Status: DC | PRN
  Filled 2021-06-09: qty 180, 45d supply, fill #0

## 2021-06-09 NOTE — ED Notes (Signed)
Patient transported to X-ray 

## 2021-06-09 NOTE — ED Triage Notes (Signed)
Discharge from [facility] today after l-spine sx, Stood up in [place], passed out and fell hitting buttocks first then back Unable to twist, move with out pain.

## 2021-06-09 NOTE — ED Provider Notes (Signed)
[facility] EMERGENCY DEPARTMENT Provider Note   CSN: [id number] Arrival date & time: 06/09/21  1619     History Chief Complaint  Patient presents with   Post-op Problem   Fall    [name] is a 65 y.o. female.  HPI      65 year old female (wife of Loreta Ave) with history of rheumatoid arthritis, fibromyalgia, hypertension, hyperlipidemia, irritable bowel syndrome, lumbar L4 sacral S1 decompression and fusion with TLIF, bone grafting with Dr. Sherlyn Lick on November 28 who was discharged from [facility] today presents with concern for syncope after returning home.   She had surgery and had IV fluids KVO, today they stopped her PCA, hemvac, foley and discharged her.  She walked with her walker from the car, into the home, and went to the bathroom.  After going from sitting to standing from the toilet, she began to feel lightheaded, and had a syncopal episode.  She was caught by daughter Cyril Mourning and they deny any significant trauma.  Notes that she has not had any oral pain medications, and her pain is worse.  Denies preceding chest pain, shortness of breath, palpitations. No areas of pain or trauma. No seizure like activity. Her hemoglobin have been low in the past requiring iron transfusions which had helped to improve prior to the surgery, but notes that her hemoglobin was again in the nines prior to discharge.  Past Medical History:  Diagnosis Date   Allergic rhinitis    Anxiety    Asthma    hx bronchial asthma at times with upper resp infection   Depression    Fibromyalgia    GERD (gastroesophageal reflux disease)    Headache(784.0)    migraine and cluster headaches   Hyperlipemia    Hypertension    IBS (irritable bowel syndrome)    Menopause    Neuromuscular disorder ([place]) 2009   hx of fibromyalgia, polyarthralsia (surgery induced)   Osteoarthritis    Rheumatoid arthritis ([place])    Complicated by osteoarthritis as well.   Rheumatoid  arthritis ([place])    Stress incontinence    at times   Tibial fracture 10/14/2016   evulsion periostial right   Type 2 diabetes mellitus with complication, without long-term current use of insulin (HCC)    diet controlled no meds -> however was diagnosed with a diabetic foot ulcer.    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   Spondylosis of cervical spine 10/07/2020   Lumbar radiculopathy 10/07/2020   Anemia 09/11/2020   Intractable migraine 08/26/2020   Inflammatory arthritis 07/22/2020   Thrombocytopenia ([place]) 07/22/2020   Chronic nonintractable headache 07/21/2020   Syncope 07/21/2020   Dizziness 07/21/2020   Cellulitis of right hand 07/21/2020   Lumbar spondylolysis 05/13/2020   Primary hypertension 04/02/2020   Body mass index (BMI) 29.0-29.9, adult 04/02/2020   Menopausal symptom 03/11/2020   Stress 03/11/2020   Unspecified dyspareunia (CODE) 03/11/2020   Bilateral sacroiliitis ([place]) 03/04/2020   Senile calcific aortic valve sclerosis 02/08/2020   DOE (dyspnea on exertion) 02/08/2020   Murmur 01/07/2020   Acquired trigger finger of right ring finger 10/25/2019  Pain in right knee 08/24/2019   Rheumatoid arthritis involving multiple sites with positive rheumatoid factor ([place]) 08/02/2019   Primary osteoarthritis of both knees 08/02/2019   PTSD (post-traumatic stress disorder) 02/08/2019   Dog bite of arm, right, initial encounter 10/05/2018   Hyperlipidemia associated with type 2 diabetes mellitus ([place]) 08/24/2018   Symptomatic abdominal panniculus 12/12/2017   Carpal tunnel syndrome of  left wrist 11/28/2017   Pain in right hand 11/08/2017   Cervical radiculopathy 10/06/2017   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   Keratosis 12/04/2014   Onychocryptosis 08/21/2014   Onychomycosis 05/21/2014   Fissured skin 01/22/2014   Fissure in skin of foot 12/18/2013   Porokeratosis 12/18/2013   Pain in lower limb 11/21/2013   Ingrown nail 07/20/2013   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 07/02/2013   Obesity (BMI 30-39.9) 06/25/2013   GERD (gastroesophageal reflux disease) 08/20/2011   Lapband APL + HH repair June 2011-Major revision Dec 2013 06/29/2011   ABDOMINAL PAIN, ACUTE 08/01/2010   VITAMIN B12 DEFICIENCY 04/01/2010   BARIATRIC SURGERY STATUS 01/29/2010   ONYCHOMYCOSIS, BILATERAL 06/30/2009   STRESS INCONTINENCE 06/10/2009   ACUTE PHARYNGITIS 04/29/2009   COUGH 04/27/2009   BRONCHITIS, ACUTE 04/16/2009   NAUSEA 04/08/2009   DIARRHEA 04/08/2009   MORBID OBESITY 03/03/2009   SKIN RASH 11/18/2008   UNSPECIFIED VITAMIN D DEFICIENCY 07/15/2008   OTHER SPECIFIED ANEMIAS 07/15/2008   Pain in joint, multiple sites 06/13/2008   Myalgia and myositis, unspecified 06/13/2008   BACK PAIN, LUMBAR 02/14/2008   ACUTE SINUSITIS, UNSPECIFIED 09/01/2007   CELLULITIS 08/12/2007   Depression with anxiety 06/09/2007   DEPRESSION 06/09/2007   Controlled type 2 diabetes mellitus with diabetic neuropathy, without long-term current use of insulin ([place]) 01/04/2007   Labile hypertension 01/04/2007   ALLERGIC RHINITIS 01/04/2007   HEADACHE 01/04/2007    Past Surgical History:  Procedure Laterality Date   ABDOMINAL HYSTERECTOMY  12/1999   complete   CARPAL TUNNEL RELEASE Right 12/27/2017   Procedure: CARPAL TUNNEL RELEASE;  Surgeon: Roseanne Kaufman, MD;  Location: [place];  Service: Orthopedics;  Laterality: Right;   CERVICAL CONIZATION W/BX  june 1990   dysplasia of cervix/used 5Fu cream for 3 months   CERVICAL LAMINECTOMY  2005 & 2009   X 2   NO ROM [place]   X 2   CHOLECYSTECTOMY  1986   DILATION AND CURETTAGE OF UTERUS   [phone]   missed abortion   ESOPHAGOGASTRODUODENOSCOPY  06/29/2011   Procedure: ESOPHAGOGASTRODUODENOSCOPY (EGD);  Surgeon: Shann Medal, MD;  Location: [facility];  Service: General;  Laterality: N/A;   FOOT SURGERY  2005   RT HEEL   GASTRIC BANDING PORT REVISION  09/24/2011   Procedure: GASTRIC BANDING PORT REVISION;  Surgeon: Pedro Earls, MD;  Location: [facility];  Service: General;  Laterality: N/A;   GASTRIC ROUX-EN-Y N/A 06/06/2017   Procedure: Conversion from [place]leeve to [place];  Surgeon: Johnathan Hausen, MD;  Location: [facility];  Service: General;  Laterality: N/A;   [place]  12/30/09   LAPAROSCOPIC GASTRIC SLEEVE RESECTION N/A 07/02/2013   Procedure: LAPAROSCOPIC GASTRIC SLEEVE RESECTION upper endoscopy;  Surgeon: Pedro Earls, MD;  Location: [facility];  Service: General;  Laterality: N/A;   LAPAROSCOPIC REPAIR AND REMOVAL OF GASTRIC BAND N/A 07/02/2013   Procedure: LAPAROSCOPIC REMOVAL OF GASTRIC BAND;  Surgeon: Pedro Earls, MD;  Location: [facility];  Service: General;  Laterality: N/A;   LAPAROSCOPIC REVISION OF GASTRIC BAND  07/03/2012   Procedure: LAPAROSCOPIC REVISION OF GASTRIC BAND;  Surgeon: Pedro Earls, MD;  Location: [facility];  Service: General;  Laterality: N/A;  removal of old lap. band port, replaced with AP standard band   LAPAROSCOPY  09/24/2011   Procedure: LAPAROSCOPY DIAGNOSTIC;  Surgeon: Pedro Earls, MD;  Location: [facility];  Service: General;  Laterality: N/A;   LAPAROSCOPY  07/03/2012   Procedure: LAPAROSCOPY DIAGNOSTIC;  Surgeon: Pedro Earls, MD;  Location: [facility];  Service: General;  Laterality: N/A;  Exploratory Laparoscopy    MESH APPLIED TO LAP PORT  07/03/2012   Procedure: MESH APPLIED TO LAP PORT;  Surgeon: Pedro Earls, MD;  Location: [facility];  Service:  General;  Laterality: N/A;   [place]   X 2   right knee  1981   ARTHROSCOPY AND ARTHROTOMY   right knee arthroscopy and arthrotomy  12-1979   TONSILLECTOMY  1971   TUBAL LIGATION  1993   WITH C -SECTION     OB History     Gravida  3   Para  0   Term  0   Preterm  0   AB  1   Living  2      SAB  1   IAB  0   Ectopic  0   Multiple  0   Live Births              Family History  Problem Relation Age of Onset   Diabetes Mother    Stroke Mother    Breast cancer Mother    Atrial fibrillation Mother    Hypertension Mother    Cancer Mother 66       breast   Diabetes Father    Stroke Father 31       2nd 26 month apart   Hypertension Father    Heart attack Father 98       stents   Dementia Father    AAA (abdominal aortic aneurysm) Father 59       repair   CAD Father 56   Thyroid disease Other    Heart disease Other    COPD Other     Social History   Tobacco Use   Smoking status: Never   Smokeless tobacco: Never  Vaping Use   Vaping Use: Never used  Substance Use Topics   Alcohol use: Not Currently   Drug use: Never    Home Medications Prior to Admission medications   Medication Sig Start Date End Date Taking? Authorizing Provider  acetaminophen (TYLENOL) 650 MG CR tablet Take 1,300 mg by mouth as needed.    [provider]  albuterol (VENTOLIN HFA) 108 (90 Base) MCG/ACT inhaler Inhale 2 puffs by mouth into the lungs every 6 (six) hours as needed for wheezing or shortness of breath. 04/21/21   [name], DO  Ca Phosphate-Cholecalciferol (CALTRATE GUMMY BITES PO) Take 2 each 2 (two) times daily by mouth.     [provider]  Cholecalciferol 125 MCG (5000 UT) TABS take 1 tablet by oral route every day    [provider]  conjugated estrogens (PREMARIN) vaginal cream INSERT 1 GRAM OF CREAM PER VAGINA THREE TIMES A WEEK BY VAGINAL ROUTE. 08/18/20 08/18/21  Earnstine Regal, PA-C  COVID-19 At  Home Antigen Test [facility] COVID-19 HOME TEST) KIT  Use as directed per package instructions. Patient not taking: Reported on 04/21/2021 03/26/21   Clementeen Graham, RPH  cyclobenzaprine (FLEXERIL) 10 MG tablet TAKE ONE TABLET (10 MG DOSE) BY MOUTH AT BEDTIME AS NEEDED. 04/29/21 04/29/22  Carollee Herter, Alferd Apa, DO  cyclobenzaprine (FLEXERIL) 10 MG tablet Take 1 tablet (10 mg total) by mouth at bedtime for muscle spasms. 06/09/21     diclofenac Sodium (VOLTAREN) 1 % GEL Apply 2 g topically See admin instructions.    [provider]  DULoxetine (CYMBALTA) 30 MG capsule 30 mg 2 (two) times daily. 2 TABS BID    [provider]  Efinaconazole (JUBLIA) 10 % SOLN Apply to affected toenails once daily for 48 weeks. 02/02/21   Marzetta Board, DPM  estradiol (DOTTI) 0.1 MG/24HR patch Place 1 patch (0.1 mg total) onto the skin 2 (two) times a week. 06/04/21     estradiol (VIVELLE-DOT) 0.1 MG/24HR Place 1 patch (0.1 mg total) onto the skin 2 (two) times a week. Monday and Thursday 06/27/12   [name], MD  fexofenadine [facility] ALLERGY) 180 MG tablet Take 1 tablet (180 mg total) by mouth daily. 04/09/21 05/09/21  Eliezer Lofts, FNP  fluticasone (FLOVENT HFA) 110 MCG/ACT inhaler Inhale 2 puffs by mouth into the lungs 2 (two) times daily. 04/21/21   Roma Schanz R, DO  furosemide (LASIX) 20 MG tablet TAKE 1 TABLET BY MOUTH EVERY MORNING 04/21/21 04/21/22  Lowne Chase, Yvonne R, DO  Galcanezumab-gnlm (EMGALITY) 120 MG/ML SOAJ INJECT 120 MG INTO THE SKIN EVERY 28 (TWENTY-EIGHT) DAYS. 05/07/21 05/07/22  Metta Clines R, DO  glucose blood test strip Test blood sugar once daily. Dx Code: E11.9 01/09/19   Carollee Herter, Alferd Apa, DO  hydrALAZINE (APRESOLINE) 25 MG tablet Take 1 tablet (25 mg total) by mouth 2 (two) times daily. Hold if feeling dizzy or SBP<110 mmHg).  Can use additional 25 mg as needed for SBP>150 mmHg 03/20/21   Leonie Man, MD  HYDROcodone-acetaminophen  (NORCO/VICODIN) 5-325 MG tablet Take 1 tablet by mouth every 6 (six) hours as needed.    [provider]  HYDROcodone-acetaminophen (NORCO/VICODIN) 5-325 MG tablet Take 1 tablet by mouth every 4 (four) to 6 (six) hours as needed for severe pain. 06/09/21     hydroxychloroquine (PLAQUENIL) 200 MG tablet Take 2 tablets by mouth once daily 01/30/21     ID NOW COVID-19 KIT TEST AS DIRECTED TODAY 03/26/21   [provider]  Insulin Pen Needle (TECHLITE PEN NEEDLES) 32G X 6 MM MISC Use with Ozempic as directed 05/04/21   Carollee Herter, Alferd Apa, DO  meclizine (ANTIVERT) 25 MG tablet Take 1 tablet (25 mg total) by mouth 3 (three) times daily as needed for dizziness. 08/01/20   Lucrezia Starch, MD  methocarbamol (ROBAXIN) 500 MG tablet TAKE 1 TABLET BY MOUTH EVERY 6 HOURS AS NEEDED FOR MUSCLE SPASMS 04/16/21     Multiple Vitamins-Minerals (CENTRUM ADULTS) TABS take 2 tablets by oral route  every day with food    [provider]  NONFORMULARY OR COMPOUNDED ITEM bp cuff  Dx hypertension 09/25/19   Carollee Herter, Yvonne R, DO  nystatin (MYCOSTATIN/NYSTOP) powder APPLY 1 APPLICATION TOPICALLY 3 (THREE) TIMES DAILY. 08/07/20 08/07/21  [name], DO  olmesartan (BENICAR) 20 MG tablet Take 1 tablet (20 mg total) by mouth daily with supper. 11/20/20   Leonie Man, MD  pantoprazole (PROTONIX) 40 MG tablet Take 1 tablet (40 mg total) by mouth daily.  04/21/21   [name], DO  potassium chloride (KLOR-CON) 10 MEQ tablet TAKE 1 TABLET (10 MEQ TOTAL) BY MOUTH DAILY. 04/21/21   [name], DO  promethazine-codeine (PHENERGAN WITH CODEINE) 6.25-10 MG/5ML syrup Take 5 mLs by mouth every 6 (six) hours as needed for cough. 03/26/21   Roma Schanz R, DO  rosuvastatin (CRESTOR) 20 MG tablet TAKE 1 TABLET BY MOUTH ONCE DAILY 04/21/21   Carollee Herter, Alferd Apa, DO  Semaglutide,0.25 or 0.5MG/DOS, (OZEMPIC, 0.25 OR 0.5 MG/DOSE,) 2 MG/1.5ML SOPN Inject 0.25 mg into the skin  once a week. 04/23/21   [name], DO  temazepam (RESTORIL) 30 MG capsule Take by mouth. 11/28/17   [provider]  thiamine (VITAMIN B-1) 100 MG tablet Take 100 mg daily by mouth.    [provider]  tocilizumab (ACTEMRA) 400 MG/20ML SOLN injection Inject 400 mg into the vein every 30 (thirty) days.    [provider]  Ubrogepant (UBRELVY) 100 MG TABS Take by mouth.    [provider]    Allergies    Isoniazid, Morphine, Nitrofurantoin, Oxycodone hcl, Hctz [hydrochlorothiazide], Macrolides and ketolides, Oseltamivir phosphate, Promethazine hcl, Sulfa antibiotics, Tamiflu [oseltamivir phosphate], Wound dressing adhesive, Gabapentin, Oseltamivir, Other, Percocet [oxycodone-acetaminophen], Roxicet [oxycodone-acetaminophen], Sulfamethoxazole-trimethoprim, and Fentanyl  Review of Systems   Review of Systems  Constitutional:  Positive for fatigue.  Respiratory:  Negative for cough and shortness of breath.   Cardiovascular:  Negative for chest pain.  Gastrointestinal:  Negative for abdominal pain and vomiting.  Genitourinary:  Negative for difficulty urinating and dysuria.  Musculoskeletal:  Positive for arthralgias and back pain.  Skin:  Negative for rash.  Neurological:  Positive for syncope, light-headedness and headaches (mild, thinks from not eating much). Negative for weakness (pain with moving left leg, had discussed with surgeon yesterday) and numbness.   Physical Exam Updated Vital Signs BP 130/62 (BP Location: Right Arm)   Pulse 83   Temp 97.7 F (36.5 C) (Oral)   Resp 18   Ht '5\' 11"'  (1.803 m)   Wt 97.1 kg   SpO2 95%   BMI 29.86 kg/m   Physical Exam Vitals and nursing note reviewed.  Constitutional:      General: She is not in acute distress.    Appearance: Normal appearance. She is not ill-appearing, toxic-appearing or diaphoretic.  HENT:     Head: Normocephalic.  Eyes:     Conjunctiva/sclera: Conjunctivae normal.   Cardiovascular:     Rate and Rhythm: Normal rate and regular rhythm.     Pulses: Normal pulses.  Pulmonary:     Effort: Pulmonary effort is normal. No respiratory distress.  Musculoskeletal:        General: Tenderness (lumbar back bilaterally) present. No deformity or signs of injury.     Cervical back: No rigidity.     Comments: Dressing lumbar dry Pain with movement of legs (had discussed with [facility] earlier)   Skin:    General: Skin is warm and dry.     Coloration: Skin is not jaundiced or pale.  Neurological:     General: No focal deficit present.     Mental Status: She is alert and oriented to person, place, and time.    ED Results / Procedures / Treatments   Labs (all labs ordered are listed, but only abnormal results are displayed) Labs Reviewed  CBC WITH DIFFERENTIAL/PLATELET - Abnormal; Notable for the following components:      Result Value  WBC 11.3 (*)    RBC 3.71 (*)    Hemoglobin 9.4 (*)    HCT 30.4 (*)    MCH 25.3 (*)    RDW 20.8 (*)    Neutro Abs 8.3 (*)    All other components within normal limits  COMPREHENSIVE METABOLIC PANEL - Abnormal; Notable for the following components:   Sodium 133 (*)    Glucose, Bld 156 (*)    Calcium 8.6 (*)    Total Protein 6.1 (*)    All other components within normal limits  CBG MONITORING, ED - Abnormal; Notable for the following components:   Glucose-Capillary 147 (*)    All other components within normal limits    EKG EKG Interpretation  Date/Time:  Tuesday June 09 2021 16:27:49 EST Ventricular Rate:  73 PR Interval:  151 QRS Duration: 142 QT Interval:  411 QTC Calculation: 453 R Axis:   28 Text Interpretation: Sinus rhythm Right bundle branch block No significant change since last tracing Confirmed by Gareth Morgan [phone]) on 06/09/2021 6:01:48 PM  Radiology DG Lumbar Spine Complete  Result Date: 06/09/2021 CLINICAL DATA:  Recent lumbar spine surgery. Syncopal episode with subsequent fall. EXAM:  LUMBAR SPINE - COMPLETE 4+ VIEW COMPARISON:  Earlier same day FINDINGS: No change. Previous discectomy and fusion procedure from L4 to the sacrum. No evidence of fracture or hardware complication. IMPRESSION: No change. Recent discectomy and fusion procedure from L4 to the sacrum. No change or traumatic finding. Electronically Signed   By: Nelson Chimes M.D.   On: 06/09/2021 18:50    Procedures Procedures   Medications Ordered in ED Medications  lactated ringers bolus 1,000 mL (0 mLs Intravenous Stopped 06/09/21 1830)  dexamethasone (DECADRON) injection 8 mg (8 mg Intravenous Given 06/09/21 1711)  HYDROcodone-acetaminophen (NORCO/VICODIN) 5-325 MG per tablet 1 tablet (1 tablet Oral Given 06/09/21 1732)    ED Course  I have reviewed the triage vital signs and the nursing notes.  Pertinent labs & imaging results that were available during my care of the patient were reviewed by me and considered in my medical decision making (see chart for details).    MDM Rules/Calculators/A&P                             65 year old female (wife of Loreta Ave) with history of rheumatoid arthritis, fibromyalgia, hypertension, hyperlipidemia, irritable bowel syndrome, lumbar L4 sacral S1 decompression and fusion with TLIF, bone grafting with Dr. Sherlyn Lick on November 28 who was discharged from [facility] today presents with concern for syncope after returning home.  They deny any significant trauma.  XR of back shows no change, recent discectomy and fusion no change or traumatic finding.  Discussed with her surgeon Dr. Sherlyn Lick who recommends 34m decadron for inflammation as well as her RA hx and that if XR without acute findings comfortable with outpatient follow up.  EKG without significant findings.  No chest pain or dyspnea, no hypoxia, no tachycardia, given surgery yesterday also have low suspicion for PE. Doubt other acute infection as etiology by symptoms. Headache mild after not eating, no  head trauma, hx not consistent with SAH.  Suspect syncopal episode related to dehydration, orthostatic syncope in setting of surgery yesterday, anemia, medications.  Improved with fluid Recommend continued supportive care with her excellent care team at home and outpt follow up. Patient discharged in stable condition with understanding of reasons to return.   Final Clinical Impression(s) /  ED Diagnoses Final diagnoses:  Syncope, unspecified syncope type  Post-op pain  Dehydration  Orthostatic syncope  Anemia, unspecified type    Rx / DC Orders ED Discharge Orders     None        Gareth Morgan, MD 06/10/21 [phone]

## 2021-06-10 ENCOUNTER — Encounter: Payer: Self-pay | Admitting: Family Medicine

## 2021-06-10 ENCOUNTER — Encounter: Payer: Self-pay | Admitting: Neurology

## 2021-06-10 ENCOUNTER — Other Ambulatory Visit (HOSPITAL_BASED_OUTPATIENT_CLINIC_OR_DEPARTMENT_OTHER): Payer: Self-pay

## 2021-06-11 DIAGNOSIS — M4726 Other spondylosis with radiculopathy, lumbar region: Secondary | ICD-10-CM | POA: Diagnosis not present

## 2021-06-11 DIAGNOSIS — M4316 Spondylolisthesis, lumbar region: Secondary | ICD-10-CM | POA: Diagnosis not present

## 2021-06-11 DIAGNOSIS — M48062 Spinal stenosis, lumbar region with neurogenic claudication: Secondary | ICD-10-CM | POA: Diagnosis not present

## 2021-06-11 DIAGNOSIS — M4317 Spondylolisthesis, lumbosacral region: Secondary | ICD-10-CM | POA: Diagnosis not present

## 2021-06-11 DIAGNOSIS — Z4789 Encounter for other orthopedic aftercare: Secondary | ICD-10-CM | POA: Diagnosis not present

## 2021-06-14 ENCOUNTER — Encounter: Payer: Self-pay | Admitting: Family Medicine

## 2021-06-15 ENCOUNTER — Telehealth: Payer: Self-pay | Admitting: Neurology

## 2021-06-15 ENCOUNTER — Other Ambulatory Visit (HOSPITAL_BASED_OUTPATIENT_CLINIC_OR_DEPARTMENT_OTHER): Payer: Self-pay

## 2021-06-15 DIAGNOSIS — M4316 Spondylolisthesis, lumbar region: Secondary | ICD-10-CM | POA: Diagnosis not present

## 2021-06-15 DIAGNOSIS — M4726 Other spondylosis with radiculopathy, lumbar region: Secondary | ICD-10-CM | POA: Diagnosis not present

## 2021-06-15 DIAGNOSIS — M4317 Spondylolisthesis, lumbosacral region: Secondary | ICD-10-CM | POA: Diagnosis not present

## 2021-06-15 DIAGNOSIS — Z4789 Encounter for other orthopedic aftercare: Secondary | ICD-10-CM | POA: Diagnosis not present

## 2021-06-15 DIAGNOSIS — M48062 Spinal stenosis, lumbar region with neurogenic claudication: Secondary | ICD-10-CM | POA: Diagnosis not present

## 2021-06-15 MED ORDER — OXYCODONE-ACETAMINOPHEN 7.5-325 MG PO TABS
ORAL_TABLET | ORAL | 0 refills | Status: DC
  Filled 2021-06-15: qty 144, 24d supply, fill #0

## 2021-06-15 NOTE — Telephone Encounter (Signed)
F/u   Call [facility] at [phone] to initiate PA, will be faxing over the form to the office at [phone].

## 2021-06-15 NOTE — Telephone Encounter (Signed)
F/u   Received from from [place] for prior authorization   Fax back to [phone]

## 2021-06-15 NOTE — Telephone Encounter (Signed)
Per Pt [facility] [phone] called and she needed a PA for urbelvy.  Please call [facility] [phone] to start PA.

## 2021-06-15 NOTE — Telephone Encounter (Signed)
Pt said she went to see about her RX Ubrelvy, it needs PA. Stated the form was sent over and no one has heard anything about it,

## 2021-06-17 DIAGNOSIS — M48062 Spinal stenosis, lumbar region with neurogenic claudication: Secondary | ICD-10-CM | POA: Diagnosis not present

## 2021-06-17 DIAGNOSIS — M4726 Other spondylosis with radiculopathy, lumbar region: Secondary | ICD-10-CM | POA: Diagnosis not present

## 2021-06-17 DIAGNOSIS — M4316 Spondylolisthesis, lumbar region: Secondary | ICD-10-CM | POA: Diagnosis not present

## 2021-06-17 DIAGNOSIS — Z4789 Encounter for other orthopedic aftercare: Secondary | ICD-10-CM | POA: Diagnosis not present

## 2021-06-17 DIAGNOSIS — M4317 Spondylolisthesis, lumbosacral region: Secondary | ICD-10-CM | POA: Diagnosis not present

## 2021-06-19 ENCOUNTER — Other Ambulatory Visit (HOSPITAL_BASED_OUTPATIENT_CLINIC_OR_DEPARTMENT_OTHER): Payer: Self-pay

## 2021-06-19 ENCOUNTER — Other Ambulatory Visit (HOSPITAL_COMMUNITY)
Admission: RE | Admit: 2021-06-19 | Discharge: 2021-06-19 | Disposition: A | Discharge status: Home / Self Care | Payer: 59 | Source: Ambulatory Visit | Attending: Family Medicine | Admitting: Family Medicine

## 2021-06-19 ENCOUNTER — Encounter: Payer: Self-pay | Admitting: Family Medicine

## 2021-06-19 ENCOUNTER — Ambulatory Visit: Payer: 59 | Admitting: Family Medicine

## 2021-06-19 VITALS — BP 140/60 | HR 93 | Temp 99.1°F | Resp 20 | Ht 71.0 in | Wt 211.0 lb

## 2021-06-19 DIAGNOSIS — D696 Thrombocytopenia, unspecified: Secondary | ICD-10-CM | POA: Diagnosis not present

## 2021-06-19 DIAGNOSIS — E11621 Type 2 diabetes mellitus with foot ulcer: Secondary | ICD-10-CM

## 2021-06-19 DIAGNOSIS — B354 Tinea corporis: Secondary | ICD-10-CM | POA: Diagnosis not present

## 2021-06-19 DIAGNOSIS — R300 Dysuria: Secondary | ICD-10-CM | POA: Insufficient documentation

## 2021-06-19 DIAGNOSIS — L97511 Non-pressure chronic ulcer of other part of right foot limited to breakdown of skin: Secondary | ICD-10-CM

## 2021-06-19 LAB — POC URINALSYSI DIPSTICK (AUTOMATED)
Bilirubin, UA: NEGATIVE
Blood, UA: NEGATIVE
Glucose, UA: NEGATIVE
Ketones, UA: NEGATIVE
Leukocytes, UA: NEGATIVE
Nitrite, UA: NEGATIVE
Protein, UA: NEGATIVE
Spec Grav, UA: 1.015 (ref 1.010–1.025)
Urobilinogen, UA: 0.2 E.U./dL
pH, UA: 5 (ref 5.0–8.0)

## 2021-06-19 MED ORDER — FLUCONAZOLE 150 MG PO TABS
ORAL_TABLET | ORAL | 0 refills | Status: DC
  Filled 2021-06-19: qty 2, 3d supply, fill #0

## 2021-06-19 MED ORDER — NYSTATIN 100000 UNIT/GM EX POWD
1.0000 "application " | Freq: Three times a day (TID) | CUTANEOUS | 0 refills | Status: DC
  Filled 2021-06-19: qty 15, 5d supply, fill #0

## 2021-06-19 NOTE — Patient Instructions (Signed)

## 2021-06-19 NOTE — Assessment & Plan Note (Signed)
Diflucan tab and nystop  rto prn

## 2021-06-19 NOTE — Assessment & Plan Note (Signed)
Check urine and self swab for culture

## 2021-06-19 NOTE — Progress Notes (Signed)
Established Patient Office Visit  Subjective:  Patient ID: [name], female    DOB: Mar 22, 1956  Age: 65 y.o. MRN: [id number]  CC:  Chief Complaint  Patient presents with  . vaginal concern    HPI [name] presents for vaginal irritation , dysuria and itching that started after her back surgery.   No other complaints   Past Medical History:  Diagnosis Date  . Allergic rhinitis   . Anxiety   . Asthma    hx bronchial asthma at times with upper resp infection  . Depression   . Fibromyalgia   . GERD (gastroesophageal reflux disease)   . Headache(784.0)    migraine and cluster headaches  . Hyperlipemia   . Hypertension   . IBS (irritable bowel syndrome)   . Menopause   . Neuromuscular disorder ([place]) 2009   hx of fibromyalgia, polyarthralsia (surgery induced)  . Osteoarthritis   . Rheumatoid arthritis ([place])    Complicated by osteoarthritis as well.  . Rheumatoid arthritis ([place])   . Stress incontinence    at times  . Tibial fracture 10/14/2016   evulsion periostial right  . Type 2 diabetes mellitus with complication, without long-term current use of insulin (HCC)    diet controlled no meds -> however was diagnosed with a diabetic foot ulcer.    Past Surgical History:  Procedure Laterality Date  . ABDOMINAL HYSTERECTOMY  12/1999   complete  . CARPAL TUNNEL RELEASE Right 12/27/2017   Procedure: CARPAL TUNNEL RELEASE;  Surgeon: Roseanne Kaufman, MD;  Location: [facility];  Service: Orthopedics;  Laterality: Right;  . CERVICAL CONIZATION W/BX  june 1990   dysplasia of cervix/used 5Fu cream for 3 months  . CERVICAL LAMINECTOMY  2005 & 2009   X 2   NO ROM PROBLEMS  . [place]  . DILATION AND CURETTAGE OF UTERUS   [phone]   missed abortion  . ESOPHAGOGASTRODUODENOSCOPY  06/29/2011   Procedure: ESOPHAGOGASTRODUODENOSCOPY (EGD);  Surgeon: Shann Medal, MD;  Location: [facility];  Service: General;   Laterality: N/A;  . FOOT SURGERY  2005   RT HEEL  . GASTRIC BANDING PORT REVISION  09/24/2011   Procedure: GASTRIC BANDING PORT REVISION;  Surgeon: Pedro Earls, MD;  Location: [facility];  Service: General;  Laterality: N/A;  . GASTRIC ROUX-EN-Y N/A 06/06/2017   Procedure: Conversion from [place] to [place];  Surgeon: Johnathan Hausen, MD;  Location: [facility];  Service: General;  Laterality: N/A;  . HYSTEROSCOPY  1999  . LAPAROSCOPIC GASTRIC BANDING  12/30/09  . LAPAROSCOPIC GASTRIC SLEEVE RESECTION N/A 07/02/2013   Procedure: LAPAROSCOPIC GASTRIC SLEEVE RESECTION upper endoscopy;  Surgeon: Pedro Earls, MD;  Location: [facility];  Service: General;  Laterality: N/A;  . LAPAROSCOPIC REPAIR AND REMOVAL OF GASTRIC BAND N/A 07/02/2013   Procedure: LAPAROSCOPIC REMOVAL OF GASTRIC BAND;  Surgeon: Pedro Earls, MD;  Location: [facility];  Service: General;  Laterality: N/A;  . LAPAROSCOPIC REVISION OF GASTRIC BAND  07/03/2012   Procedure: LAPAROSCOPIC REVISION OF GASTRIC BAND;  Surgeon: Pedro Earls, MD;  Location: [facility];  Service: General;  Laterality: N/A;  removal of old lap. band port, replaced with AP standard band  . LAPAROSCOPY  09/24/2011   Procedure: LAPAROSCOPY DIAGNOSTIC;  Surgeon: Pedro Earls, MD;  Location: [facility];  Service: General;  Laterality: N/A;  . LAPAROSCOPY  07/03/2012  Procedure: LAPAROSCOPY DIAGNOSTIC;  Surgeon: Pedro Earls, MD;  Location: [facility];  Service: General;  Laterality: N/A;  Exploratory Laparoscopy   . MESH APPLIED TO LAP PORT  07/03/2012   Procedure: MESH APPLIED TO LAP PORT;  Surgeon: Pedro Earls, MD;  Location: [facility];  Service: General;  Laterality: N/A;  . [place]   X 2  . right knee  1981   ARTHROSCOPY AND ARTHROTOMY  . right knee arthroscopy and arthrotomy  12-1979  . TONSILLECTOMY  1971  . TUBAL LIGATION  1993   WITH C -SECTION    Family History  Problem  Relation Age of Onset  . Diabetes Mother   . Stroke Mother   . Breast cancer Mother   . Atrial fibrillation Mother   . Hypertension Mother   . Cancer Mother 33       breast  . Diabetes Father   . Stroke Father 36       2nd 6 month apart  . Hypertension Father   . Heart attack Father 60       stents  . Dementia Father   . AAA (abdominal aortic aneurysm) Father 82       repair  . CAD Father 80  . Thyroid disease Other   . Heart disease Other   . COPD Other     Social History   Socioeconomic History  . Marital status: Married    Spouse name: Marieli Rudy  . Number of children: 2  . Years of education: Not on file  . Highest education level: Not on file  Occupational History  . Occupation: Programmer, multimedia: [employer]  Tobacco Use  . Smoking status: Never  . Smokeless tobacco: Never  Vaping Use  . Vaping Use: Never used  Substance and Sexual Activity  . Alcohol use: Not Currently  . Drug use: Never  . Sexual activity: Yes    Birth control/protection: None  Other Topics Concern  . Not on file  Social History Narrative   Right handed   Two story home   Drinks caffeine occasionally   Social Determinants of Health   Financial Resource Strain: Not on file  Food Insecurity: Not on file  Transportation Needs: Not on file  Physical Activity: Not on file  Stress: Not on file  Social Connections: Not on file  Intimate Partner Violence: Not on file    Outpatient Medications Prior to Visit  Medication Sig Dispense Refill  . acetaminophen (TYLENOL) 650 MG CR tablet Take 1,300 mg by mouth as needed.    Marland Kitchen albuterol (VENTOLIN HFA) 108 (90 Base) MCG/ACT inhaler Inhale 2 puffs by mouth into the lungs every 6 (six) hours as needed for wheezing or shortness of breath. 18 g 0  . Ca Phosphate-Cholecalciferol (CALTRATE GUMMY BITES PO) Take 2 each 2 (two) times daily by mouth.     . Cholecalciferol 125 MCG (5000 UT) TABS take 1 tablet by oral route every day     . conjugated estrogens (PREMARIN) vaginal cream INSERT 1 GRAM OF CREAM PER VAGINA THREE TIMES A WEEK BY VAGINAL ROUTE. 30 g 5  . cyclobenzaprine (FLEXERIL) 10 MG tablet TAKE ONE TABLET (10 MG DOSE) BY MOUTH AT BEDTIME AS NEEDED. 30 tablet 2  . cyclobenzaprine (FLEXERIL) 10 MG tablet Take 1 tablet (10 mg total) by mouth at bedtime for muscle spasms. 30 tablet 0  . diclofenac Sodium (VOLTAREN) 1 % GEL Apply  2 g topically See admin instructions.    . DULoxetine (CYMBALTA) 30 MG capsule 30 mg 2 (two) times daily. 2 TABS BID    . Efinaconazole (JUBLIA) 10 % SOLN Apply to affected toenails once daily for 48 weeks. 8 mL 11  . estradiol (DOTTI) 0.1 MG/24HR patch Place 1 patch (0.1 mg total) onto the skin 2 (two) times a week. 24 patch 2  . estradiol (VIVELLE-DOT) 0.1 MG/24HR Place 1 patch (0.1 mg total) onto the skin 2 (two) times a week. Monday and Thursday 8 patch 11  . fluticasone (FLOVENT HFA) 110 MCG/ACT inhaler Inhale 2 puffs by mouth into the lungs 2 (two) times daily. 12 g 12  . furosemide (LASIX) 20 MG tablet TAKE 1 TABLET BY MOUTH EVERY MORNING 90 tablet 1  . Galcanezumab-gnlm (EMGALITY) 120 MG/ML SOAJ INJECT 120 MG INTO THE SKIN EVERY 28 (TWENTY-EIGHT) DAYS. 1 mL 5  . glucose blood test strip Test blood sugar once daily. Dx Code: E11.9 100 each 12  . hydrALAZINE (APRESOLINE) 25 MG tablet Take 1 tablet (25 mg total) by mouth 2 (two) times daily. Hold if feeling dizzy or SBP<110 mmHg).  Can use additional 25 mg as needed for SBP>150 mmHg 180 tablet 3  . HYDROcodone-acetaminophen (NORCO/VICODIN) 5-325 MG tablet Take 1 tablet by mouth every 6 (six) hours as needed.    Marland Kitchen HYDROcodone-acetaminophen (NORCO/VICODIN) 5-325 MG tablet Take 1 tablet by mouth every 4 (four) to 6 (six) hours as needed for severe pain. 180 tablet 0  . hydroxychloroquine (PLAQUENIL) 200 MG tablet Take 2 tablets by mouth once daily 60 tablet 3  . ID NOW COVID-19 KIT TEST AS DIRECTED TODAY    . Insulin Pen Needle (TECHLITE PEN  NEEDLES) 32G X 6 MM MISC Use with Ozempic as directed 100 each 2  . meclizine (ANTIVERT) 25 MG tablet Take 1 tablet (25 mg total) by mouth 3 (three) times daily as needed for dizziness. 20 tablet 0  . methocarbamol (ROBAXIN) 500 MG tablet TAKE 1 TABLET BY MOUTH EVERY 6 HOURS AS NEEDED FOR MUSCLE SPASMS 120 tablet 0  . Multiple Vitamins-Minerals (CENTRUM ADULTS) TABS take 2 tablets by oral route  every day with food    . NONFORMULARY OR COMPOUNDED ITEM bp cuff  Dx hypertension 1 each 0  . nystatin (MYCOSTATIN/NYSTOP) powder APPLY 1 APPLICATION TOPICALLY 3 (THREE) TIMES DAILY. 15 g 5  . olmesartan (BENICAR) 20 MG tablet Take 1 tablet (20 mg total) by mouth daily with supper. 90 tablet 3  . oxyCODONE-acetaminophen (PERCOCET) 7.5-325 MG tablet Take 1 tablet by mouth every 4 hours as needed for pain 144 tablet 0  . pantoprazole (PROTONIX) 40 MG tablet Take 1 tablet (40 mg total) by mouth daily. 90 tablet 3  . potassium chloride (KLOR-CON) 10 MEQ tablet TAKE 1 TABLET (10 MEQ TOTAL) BY MOUTH DAILY. 90 tablet 1  . promethazine-codeine (PHENERGAN WITH CODEINE) 6.25-10 MG/5ML syrup Take 5 mLs by mouth every 6 (six) hours as needed for cough. 120 mL 0  . rosuvastatin (CRESTOR) 20 MG tablet TAKE 1 TABLET BY MOUTH ONCE DAILY 90 tablet 1  . Semaglutide,0.25 or 0.5MG/DOS, (OZEMPIC, 0.25 OR 0.5 MG/DOSE,) 2 MG/1.5ML SOPN Inject 0.25 mg into the skin once a week. 1.5 mL 2  . temazepam (RESTORIL) 30 MG capsule Take by mouth.    . thiamine (VITAMIN B-1) 100 MG tablet Take 100 mg daily by mouth.    . tocilizumab (ACTEMRA) 400 MG/20ML SOLN injection Inject 400 mg into the vein  every 30 (thirty) days.    Marland Kitchen Ubrogepant (UBRELVY) 100 MG TABS Take by mouth.    . fexofenadine (ALLEGRA ALLERGY) 180 MG tablet Take 1 tablet (180 mg total) by mouth daily. 30 tablet 0  . COVID-19 At Home Antigen Test (CARESTART COVID-19 HOME TEST) KIT Use as directed per package instructions. (Patient not taking: Reported on 04/21/2021) 2 kit 0    No facility-administered medications prior to visit.    Allergies  Allergen Reactions  . Isoniazid     Severe SOB  . Morphine Other (See Comments), Nausea And Vomiting and Hives    severe vomiting severe vomiting  Other reaction(s): Vomiting   . Nitrofurantoin Shortness Of Breath, Other (See Comments) and Rash    REACTION: welts REACTION: welts Other reaction(s): Unknown  . Oxycodone Hcl Hives and Itching  . Hctz [Hydrochlorothiazide]     rash  . Macrolides And Ketolides     rash Other reaction(s): Unknown rash rash  . Oseltamivir Phosphate Nausea And Vomiting    "I vomited within 30 minutes of taking it and was told I cannot ever take it again."  . Promethazine Hcl Other (See Comments)    IF GIVEN  IV-hallucinations     CAN TAKE PO PHENERGAN Other reaction(s): Unknown  . Sulfa Antibiotics Rash  . Tamiflu [Oseltamivir Phosphate] Nausea And Vomiting    "I vomited within 30 minutes of taking it and was told I cannot ever take it again."  . Wound Dressing Adhesive Rash    Adhesive Tape   . Gabapentin     Pt states she has eye swelling   . Oseltamivir     Other reaction(s): Unknown Other reaction(s): Unknown  . Other Other (See Comments)    bandaids  . Percocet [Oxycodone-Acetaminophen]     REACTION: severe itching. Can take by mouth codeine  . Roxicet [Oxycodone-Acetaminophen]     itching  . Sulfamethoxazole-Trimethoprim     Septra / Bactrim  --REACTION: rash Other reaction(s): Unknown  . Fentanyl Hives and Rash    REACTION: welts    ROS Review of Systems  Constitutional:  Negative for appetite change, diaphoresis, fatigue and unexpected weight change.  Eyes:  Negative for pain, redness and visual disturbance.  Respiratory:  Negative for cough, chest tightness, shortness of breath and wheezing.   Cardiovascular:  Negative for chest pain, palpitations and leg swelling.  Endocrine: Negative for cold intolerance, heat intolerance, polydipsia, polyphagia and  polyuria.  Genitourinary:  Positive for dysuria and vaginal pain. Negative for difficulty urinating and frequency.  Skin:  Positive for rash.  Neurological:  Negative for dizziness, light-headedness, numbness and headaches.     Objective:    Physical Exam Vitals and nursing note reviewed.  Constitutional:      Appearance: She is well-developed.  HENT:     Head: Normocephalic and atraumatic.  Eyes:     Conjunctiva/sclera: Conjunctivae normal.  Neck:     Thyroid: No thyromegaly.     Vascular: No carotid bruit or JVD.  Cardiovascular:     Rate and Rhythm: Normal rate and regular rhythm.     Heart sounds: Normal heart sounds. No murmur heard. Pulmonary:     Effort: Pulmonary effort is normal. No respiratory distress.     Breath sounds: Normal breath sounds. No wheezing or rales.  Chest:     Chest wall: No tenderness.  Genitourinary:    Labia:        Right: No rash or tenderness.  Left: Rash and tenderness present.      Comments: + errythema L buttock and groin c/w yeast  No d/c  Self swab done  Musculoskeletal:     Cervical back: Normal range of motion and neck supple.  Skin:    Findings: Erythema and rash present.  Neurological:     Mental Status: She is alert and oriented to person, place, and time.   BP 140/60 (BP Location: Left Arm, Patient Position: Sitting, Cuff Size: Normal)   Pulse 93   Temp 99.1 F (37.3 C) (Oral)   Resp 20   Ht '5\' 11"'  (1.803 m)   Wt 211 lb (95.7 kg)   SpO2 97%   BMI 29.43 kg/m  Wt Readings from Last 3 Encounters:  06/19/21 211 lb (95.7 kg)  06/09/21 214 lb 1.1 oz (97.1 kg)  05/11/21 214 lb (97.1 kg)     Health Maintenance Due  Topic Date Due  . Pneumococcal Vaccine 22-11 Years old (2 - PCV) 07/04/2014  . FOOT EXAM  08/01/2020  . COVID-19 Vaccine (5 - Booster for Pfizer series) 12/15/2020    There are no preventive care reminders to display for this patient.  Lab Results  Component Value Date   TSH 2.47 04/21/2021    Lab Results  Component Value Date   WBC 11.3 (H) 06/09/2021   HGB 9.4 (L) 06/09/2021   HCT 30.4 (L) 06/09/2021   MCV 81.9 06/09/2021   PLT 159 06/09/2021   Lab Results  Component Value Date   NA 133 (L) 06/09/2021   K 4.1 06/09/2021   CO2 25 06/09/2021   GLUCOSE 156 (H) 06/09/2021   BUN 10 06/09/2021   CREATININE 0.88 06/09/2021   BILITOT 0.3 06/09/2021   ALKPHOS 56 06/09/2021   AST 37 06/09/2021   ALT 41 06/09/2021   PROT 6.1 (L) 06/09/2021   ALBUMIN 3.5 06/09/2021   CALCIUM 8.6 (L) 06/09/2021   ANIONGAP 7 06/09/2021   GFR 74.56 04/21/2021   Lab Results  Component Value Date   CHOL 123 04/21/2021   Lab Results  Component Value Date   HDL 70.40 04/21/2021   Lab Results  Component Value Date   LDLCALC 38 04/21/2021   Lab Results  Component Value Date   TRIG 74.0 04/21/2021   Lab Results  Component Value Date   CHOLHDL 2 04/21/2021   Lab Results  Component Value Date   HGBA1C 7.3 (H) 04/21/2021      Assessment & Plan:   Problem List Items Addressed This Visit   None Visit Diagnoses     Tinea corporis    -  Primary   Relevant Medications   fluconazole (DIFLUCAN) 150 MG tablet   nystatin powder   Dysuria       Relevant Orders   POCT Urinalysis Dipstick (Automated) (Completed)   Cervicovaginal ancillary only( [place])       Meds ordered this encounter  Medications  . fluconazole (DIFLUCAN) 150 MG tablet    Sig: Take 1 tablet by mouth x1, may repeat in 3 days as needed    Dispense:  2 tablet    Refill:  0  . nystatin powder    Sig: Apply 1 application topically 3 (three) times daily.    Dispense:  15 g    Refill:  0    Follow-up: Return if symptoms worsen or fail to improve.    [name], DO

## 2021-06-22 DIAGNOSIS — M4316 Spondylolisthesis, lumbar region: Secondary | ICD-10-CM | POA: Diagnosis not present

## 2021-06-22 DIAGNOSIS — M4317 Spondylolisthesis, lumbosacral region: Secondary | ICD-10-CM | POA: Diagnosis not present

## 2021-06-22 DIAGNOSIS — Z4789 Encounter for other orthopedic aftercare: Secondary | ICD-10-CM | POA: Diagnosis not present

## 2021-06-22 DIAGNOSIS — M4726 Other spondylosis with radiculopathy, lumbar region: Secondary | ICD-10-CM | POA: Diagnosis not present

## 2021-06-22 DIAGNOSIS — M48062 Spinal stenosis, lumbar region with neurogenic claudication: Secondary | ICD-10-CM | POA: Diagnosis not present

## 2021-06-22 LAB — CERVICOVAGINAL ANCILLARY ONLY
Bacterial Vaginitis (gardnerella): NEGATIVE
Candida Glabrata: NEGATIVE
Candida Vaginitis: POSITIVE — AB
Chlamydia: NEGATIVE
Comment: NEGATIVE
Comment: NEGATIVE
Comment: NEGATIVE
Comment: NEGATIVE
Comment: NEGATIVE
Comment: NORMAL
Neisseria Gonorrhea: NEGATIVE
Trichomonas: NEGATIVE

## 2021-06-23 DIAGNOSIS — Z4789 Encounter for other orthopedic aftercare: Secondary | ICD-10-CM | POA: Diagnosis not present

## 2021-06-23 DIAGNOSIS — M4316 Spondylolisthesis, lumbar region: Secondary | ICD-10-CM | POA: Diagnosis not present

## 2021-06-23 DIAGNOSIS — M4317 Spondylolisthesis, lumbosacral region: Secondary | ICD-10-CM | POA: Diagnosis not present

## 2021-06-23 DIAGNOSIS — M4726 Other spondylosis with radiculopathy, lumbar region: Secondary | ICD-10-CM | POA: Diagnosis not present

## 2021-06-24 ENCOUNTER — Other Ambulatory Visit: Payer: Self-pay | Admitting: Family Medicine

## 2021-06-24 ENCOUNTER — Other Ambulatory Visit (HOSPITAL_BASED_OUTPATIENT_CLINIC_OR_DEPARTMENT_OTHER): Payer: Self-pay

## 2021-06-24 DIAGNOSIS — G4700 Insomnia, unspecified: Secondary | ICD-10-CM

## 2021-06-24 MED ORDER — PREGABALIN 75 MG PO CAPS
ORAL_CAPSULE | ORAL | 0 refills | Status: DC
  Filled 2021-06-24: qty 60, 32d supply, fill #0

## 2021-06-25 ENCOUNTER — Other Ambulatory Visit (HOSPITAL_BASED_OUTPATIENT_CLINIC_OR_DEPARTMENT_OTHER): Payer: Self-pay

## 2021-06-25 ENCOUNTER — Telehealth: Payer: Self-pay | Admitting: Family Medicine

## 2021-06-25 NOTE — Telephone Encounter (Signed)
Patient states the pharmacy told her med was discontinued  Medication: temazepam (RESTORIL) 30 MG capsule [[id number]] 3 month supply    Has the patient contacted their pharmacy? Yes.   (If no, request that the patient contact the pharmacy for the refill.) (If yes, when and what did the pharmacy advise?)  Preferred Pharmacy (with phone number or street name):  [facility]  [address] [place], [place] [address]  Phone:  [phone]  Fax:  [phone]   Agent: Please be advised that RX refills may take up to 3 business days. We ask that you follow-up with your pharmacy.

## 2021-06-26 ENCOUNTER — Other Ambulatory Visit (HOSPITAL_BASED_OUTPATIENT_CLINIC_OR_DEPARTMENT_OTHER): Payer: Self-pay

## 2021-06-26 ENCOUNTER — Other Ambulatory Visit: Payer: Self-pay | Admitting: Family Medicine

## 2021-06-26 MED ORDER — TEMAZEPAM 30 MG PO CAPS
30.0000 mg | ORAL_CAPSULE | Freq: Every evening | ORAL | 0 refills | Status: DC | PRN
Start: 2021-06-26 — End: 2021-09-28
  Filled 2021-06-26: qty 90, 90d supply, fill #0

## 2021-06-26 NOTE — Telephone Encounter (Signed)
Refills sent

## 2021-06-26 NOTE — Telephone Encounter (Signed)
Spoke with Aleene Davidson at [facility] and last fill for pt was 03/20/21.  The rx from 04/21/21 #90 was never filled.  Requesting: temazepam Contract: none UDS: none Last Visit: 06/19/21 Next Visit: 10/22/21 Last Refill:  03/20/21  Please Advise

## 2021-06-30 ENCOUNTER — Telehealth: Payer: Self-pay

## 2021-06-30 DIAGNOSIS — M4317 Spondylolisthesis, lumbosacral region: Secondary | ICD-10-CM | POA: Diagnosis not present

## 2021-06-30 DIAGNOSIS — M4726 Other spondylosis with radiculopathy, lumbar region: Secondary | ICD-10-CM | POA: Diagnosis not present

## 2021-06-30 DIAGNOSIS — M48062 Spinal stenosis, lumbar region with neurogenic claudication: Secondary | ICD-10-CM | POA: Diagnosis not present

## 2021-06-30 DIAGNOSIS — Z4789 Encounter for other orthopedic aftercare: Secondary | ICD-10-CM | POA: Diagnosis not present

## 2021-06-30 DIAGNOSIS — M4316 Spondylolisthesis, lumbar region: Secondary | ICD-10-CM | POA: Diagnosis not present

## 2021-06-30 NOTE — Telephone Encounter (Signed)
New message   Prior Authorization initiate via [place]

## 2021-07-01 DIAGNOSIS — M4317 Spondylolisthesis, lumbosacral region: Secondary | ICD-10-CM | POA: Diagnosis not present

## 2021-07-01 DIAGNOSIS — M4726 Other spondylosis with radiculopathy, lumbar region: Secondary | ICD-10-CM | POA: Diagnosis not present

## 2021-07-01 DIAGNOSIS — Z4789 Encounter for other orthopedic aftercare: Secondary | ICD-10-CM | POA: Diagnosis not present

## 2021-07-01 DIAGNOSIS — M4316 Spondylolisthesis, lumbar region: Secondary | ICD-10-CM | POA: Diagnosis not present

## 2021-07-01 DIAGNOSIS — M48062 Spinal stenosis, lumbar region with neurogenic claudication: Secondary | ICD-10-CM | POA: Diagnosis not present

## 2021-07-02 ENCOUNTER — Other Ambulatory Visit (HOSPITAL_BASED_OUTPATIENT_CLINIC_OR_DEPARTMENT_OTHER): Payer: Self-pay

## 2021-07-02 MED ORDER — PREGABALIN 150 MG PO CAPS
150.0000 mg | ORAL_CAPSULE | Freq: Two times a day (BID) | ORAL | 0 refills | Status: DC
  Filled 2021-07-02: qty 60, 30d supply, fill #0

## 2021-07-07 ENCOUNTER — Inpatient Hospital Stay (HOSPITAL_BASED_OUTPATIENT_CLINIC_OR_DEPARTMENT_OTHER): Payer: 59 | Admitting: Family

## 2021-07-07 ENCOUNTER — Other Ambulatory Visit (HOSPITAL_BASED_OUTPATIENT_CLINIC_OR_DEPARTMENT_OTHER): Payer: Self-pay

## 2021-07-07 ENCOUNTER — Other Ambulatory Visit: Payer: Self-pay | Admitting: Family Medicine

## 2021-07-07 ENCOUNTER — Inpatient Hospital Stay: Payer: 59 | Attending: Family

## 2021-07-07 ENCOUNTER — Other Ambulatory Visit: Payer: Self-pay

## 2021-07-07 VITALS — BP 117/62 | HR 83 | Temp 98.6°F | Resp 20 | Ht 71.0 in | Wt 209.8 lb

## 2021-07-07 DIAGNOSIS — K912 Postsurgical malabsorption, not elsewhere classified: Secondary | ICD-10-CM | POA: Diagnosis not present

## 2021-07-07 DIAGNOSIS — D509 Iron deficiency anemia, unspecified: Secondary | ICD-10-CM | POA: Diagnosis not present

## 2021-07-07 DIAGNOSIS — D508 Other iron deficiency anemias: Secondary | ICD-10-CM | POA: Diagnosis not present

## 2021-07-07 LAB — CBC WITH DIFFERENTIAL (CANCER CENTER ONLY)
Abs Immature Granulocytes: 0.09 10*3/uL — ABNORMAL HIGH (ref 0.00–0.07)
Basophils Absolute: 0.1 10*3/uL (ref 0.0–0.1)
Basophils Relative: 1 %
Eosinophils Absolute: 0.4 10*3/uL (ref 0.0–0.5)
Eosinophils Relative: 4 %
HCT: 32.3 % — ABNORMAL LOW (ref 36.0–46.0)
Hemoglobin: 9.9 g/dL — ABNORMAL LOW (ref 12.0–15.0)
Immature Granulocytes: 1 %
Lymphocytes Relative: 17 %
Lymphs Abs: 1.5 10*3/uL (ref 0.7–4.0)
MCH: 25.5 pg — ABNORMAL LOW (ref 26.0–34.0)
MCHC: 30.7 g/dL (ref 30.0–36.0)
MCV: 83.2 fL (ref 80.0–100.0)
Monocytes Absolute: 0.7 10*3/uL (ref 0.1–1.0)
Monocytes Relative: 9 %
Neutro Abs: 5.7 10*3/uL (ref 1.7–7.7)
Neutrophils Relative %: 68 %
Platelet Count: 350 10*3/uL (ref 150–400)
RBC: 3.88 MIL/uL (ref 3.87–5.11)
RDW: 19.3 % — ABNORMAL HIGH (ref 11.5–15.5)
WBC Count: 8.4 10*3/uL (ref 4.0–10.5)
nRBC: 0 % (ref 0.0–0.2)

## 2021-07-07 LAB — RETICULOCYTES
Immature Retic Fract: 21.8 % — ABNORMAL HIGH (ref 2.3–15.9)
RBC.: 3.85 MIL/uL — ABNORMAL LOW (ref 3.87–5.11)
Retic Count, Absolute: 74.3 10*3/uL (ref 19.0–186.0)
Retic Ct Pct: 1.9 % (ref 0.4–3.1)

## 2021-07-07 MED ORDER — DULOXETINE HCL 30 MG PO CPEP
30.0000 mg | ORAL_CAPSULE | Freq: Two times a day (BID) | ORAL | 1 refills | Status: DC
  Filled 2021-07-07 – 2021-07-15 (×4): qty 180, 90d supply, fill #0
  Filled 2021-10-21: qty 180, 90d supply, fill #1

## 2021-07-07 NOTE — Progress Notes (Signed)
Hematology and Oncology Follow Up Visit  [name] [id number] 1956-04-18 64 y.o. 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 She had extensive back surgery last month with [name] that included right sided L4-5 and L5-S1 transforaminal lumbar interbody fusion with one titanium cage, posterior spinal fusion at L4-5 and L5-S1, posterior spinal segmental instrumentation at L4-5, S1 with screw placement, hemilaminectomy at L4-5 and L5-S1 with decompression of lateral recess, thecal sac, L4-5 and S1 nerve root at level where interbody infusion was performed, bone grafting using patient's own autograft, allograft and BMP.   Hgb is stable at 9.9, MCV 83, platelets 350 and WBC count 8.4.  She is having lower back pain and sciatica in the right leg. She states that her current regimen of muscle relaxers and pain medication is effective.  She is currently in PT once a week.  She is symptomatic with fatigue and weakness at times.  She is using a walker while out for added support.  No swelling in her extremities at this time.  She had a syncopal episode right after returning home from surgery but thankfully was not seriously injured. She did go to the ED to be evaluated.  She wears her back brace for added support.  No fever, chills, vomiting, cough, rash, dizziness, SOB, chest pain, palpitations, abdominal pain or changes in bowel or bladder habits.  No abnormal bruising, no petechiae.  She has a good appetite and is hydrating throughout the day. Her weight is 209 lbs.   ECOG Performance Status: 1 - Symptomatic but completely ambulatory  Medications:  Allergies as of 07/07/2021       Reactions   Isoniazid    Severe SOB   Morphine Other (See Comments), Nausea And Vomiting, Hives   severe  vomiting severe vomiting Other reaction(s): Vomiting   Nitrofurantoin Shortness Of Breath, Other (See Comments), Rash   REACTION: welts REACTION: welts Other reaction(s): Unknown   Oxycodone Hcl Hives, Itching   Hctz [hydrochlorothiazide]    rash   Macrolides And Ketolides    rash Other reaction(s): Unknown rash rash   Oseltamivir Phosphate Nausea And Vomiting   "I vomited within 30 minutes of taking it and was told I cannot ever take it again."   Promethazine Hcl Other (See Comments)   IF GIVEN  IV-hallucinations     CAN TAKE PO PHENERGAN Other reaction(s): Unknown   Sulfa Antibiotics Rash   Tamiflu [oseltamivir Phosphate] Nausea And Vomiting   "I vomited within 30 minutes of taking it and was told I cannot ever take it again."   Wound Dressing Adhesive Rash   Adhesive Tape    Gabapentin    Pt states she has eye swelling    Oseltamivir    Other reaction(s): Unknown Other reaction(s): Unknown   Other Other (See Comments)   bandaids   Percocet [oxycodone-acetaminophen]    REACTION: severe itching. Can take by mouth codeine   Roxicet [oxycodone-acetaminophen]    itching   Sulfamethoxazole-trimethoprim    Septra / Bactrim  --REACTION: rash Other reaction(s): Unknown   Fentanyl Hives, Rash   REACTION: welts        Medication List        Accurate as of July 07, 2021  2:35 PM. If you have any questions, ask your nurse or doctor.  acetaminophen 650 MG CR tablet Commonly known as: TYLENOL Take 1,300 mg by mouth as needed.   albuterol 108 (90 Base) MCG/ACT inhaler Commonly known as: VENTOLIN HFA Inhale 2 puffs by mouth into the lungs every 6 (six) hours as needed for wheezing or shortness of breath.   CALTRATE GUMMY BITES PO Take 2 each 2 (two) times daily by mouth.   Centrum Adults Tabs take 2 tablets by oral route  every day with food   Cholecalciferol 125 MCG (5000 UT) Tabs take 1 tablet by oral route every day   cyclobenzaprine 10 MG  tablet Commonly known as: FLEXERIL TAKE ONE TABLET (10 MG DOSE) BY MOUTH AT BEDTIME AS NEEDED.   cyclobenzaprine 10 MG tablet Commonly known as: FLEXERIL Take 1 tablet (10 mg total) by mouth at bedtime for muscle spasms.   diclofenac Sodium 1 % Gel Commonly known as: VOLTAREN Apply 2 g topically See admin instructions.   DULoxetine 30 MG capsule Commonly known as: CYMBALTA Take 1 capsule (30 mg total) by mouth 2 (two) times daily. What changed:  how to take this additional instructions Changed by: [name], DO   Emgality 120 MG/ML Soaj Generic drug: Galcanezumab-gnlm INJECT 120 MG INTO THE SKIN EVERY 28 (TWENTY-EIGHT) DAYS.   estradiol 0.1 MG/24HR patch Commonly known as: VIVELLE-DOT Place 1 patch (0.1 mg total) onto the skin 2 (two) times a week. Monday and Thursday   Dotti 0.1 MG/24HR patch Generic drug: estradiol Place 1 patch (0.1 mg total) onto the skin 2 (two) times a week.   fexofenadine 180 MG tablet Commonly known as: Allegra Allergy Take 1 tablet (180 mg total) by mouth daily.   fluconazole 150 MG tablet Commonly known as: DIFLUCAN Take 1 tablet by mouth x1, may repeat in 3 days as needed   fluticasone 110 MCG/ACT inhaler Commonly known as: FLOVENT HFA Inhale 2 puffs by mouth into the lungs 2 (two) times daily.   furosemide 20 MG tablet Commonly known as: LASIX TAKE 1 TABLET BY MOUTH EVERY MORNING   glucose blood test strip Test blood sugar once daily. Dx Code: E11.9   hydrALAZINE 25 MG tablet Commonly known as: APRESOLINE Take 1 tablet (25 mg total) by mouth 2 (two) times daily. Hold if feeling dizzy or SBP<110 mmHg).  Can use additional 25 mg as needed for SBP>150 mmHg   HYDROcodone-acetaminophen 5-325 MG tablet Commonly known as: NORCO/VICODIN Take 1 tablet by mouth every 6 (six) hours as needed.   HYDROcodone-acetaminophen 5-325 MG tablet Commonly known as: NORCO/VICODIN Take 1 tablet by mouth every 4 (four) to 6 (six) hours as  needed for severe pain.   hydroxychloroquine 200 MG tablet Commonly known as: Plaquenil Take 2 tablets by mouth once daily   ID Now COVID-19 Kit Generic drug: COVID-19 Test TEST AS DIRECTED TODAY   Jublia 10 % Soln Generic drug: Efinaconazole Apply to affected toenails once daily for 48 weeks.   meclizine 25 MG tablet Commonly known as: ANTIVERT Take 1 tablet (25 mg total) by mouth 3 (three) times daily as needed for dizziness.   methocarbamol 500 MG tablet Commonly known as: ROBAXIN TAKE 1 TABLET BY MOUTH EVERY 6 HOURS AS NEEDED FOR MUSCLE SPASMS   NONFORMULARY OR COMPOUNDED ITEM bp cuff  Dx hypertension   nystatin powder Commonly known as: MYCOSTATIN/NYSTOP APPLY 1 APPLICATION TOPICALLY 3 (THREE) TIMES DAILY.   nystatin powder Commonly known as: nystatin Apply 1 application topically 3 (three) times daily.   olmesartan 20 MG tablet  Commonly known as: BENICAR Take 1 tablet (20 mg total) by mouth daily with supper.   oxyCODONE-acetaminophen 7.5-325 MG tablet Commonly known as: Percocet Take 1 tablet by mouth every 4 hours as needed for pain   Ozempic (0.25 or 0.5 MG/DOSE) 2 MG/1.5ML Sopn Generic drug: Semaglutide(0.25 or 0.5MG/DOS) Inject 0.25 mg into the skin once a week.   pantoprazole 40 MG tablet Commonly known as: PROTONIX Take 1 tablet (40 mg total) by mouth daily.   potassium chloride 10 MEQ tablet Commonly known as: KLOR-CON M TAKE 1 TABLET (10 MEQ TOTAL) BY MOUTH DAILY.   pregabalin 75 MG capsule Commonly known as: LYRICA Take 1 capsule by mouth at night for 4 nights then increase to 1 capsule in the morning and 1 capsule at night   pregabalin 150 MG capsule Commonly known as: LYRICA Take 1 capsule (150 mg total) by mouth 2 (two) times daily.   Premarin vaginal cream Generic drug: conjugated estrogens INSERT 1 GRAM OF CREAM PER VAGINA THREE TIMES A WEEK BY VAGINAL ROUTE.   Promethazine-Codeine 6.25-10 MG/5ML Soln Take 5 mLs by mouth every 6  (six) hours as needed for cough.   rosuvastatin 20 MG tablet Commonly known as: CRESTOR TAKE 1 TABLET BY MOUTH ONCE DAILY   TechLite Pen Needles 32G X 6 MM Misc Generic drug: Insulin Pen Needle Use with Ozempic as directed   temazepam 30 MG capsule Commonly known as: RESTORIL Take 1 capsule (30 mg total) by mouth at bedtime as needed for sleep.   thiamine 100 MG tablet Commonly known as: Vitamin B-1 Take 100 mg daily by mouth.   tocilizumab 400 MG/20ML Soln injection Commonly known as: ACTEMRA Inject 400 mg into the vein every 30 (thirty) days.   Ubrelvy 100 MG Tabs Generic drug: Ubrogepant Take by mouth.        Allergies:  Allergies  Allergen Reactions   Isoniazid     Severe SOB   Morphine Other (See Comments), Nausea And Vomiting and Hives    severe vomiting severe vomiting  Other reaction(s): Vomiting    Nitrofurantoin Shortness Of Breath, Other (See Comments) and Rash    REACTION: welts REACTION: welts Other reaction(s): Unknown   Oxycodone Hcl Hives and Itching   Hctz [Hydrochlorothiazide]     rash   Macrolides And Ketolides     rash Other reaction(s): Unknown rash rash   Oseltamivir Phosphate Nausea And Vomiting    "I vomited within 30 minutes of taking it and was told I cannot ever take it again."   Promethazine Hcl Other (See Comments)    IF GIVEN  IV-hallucinations     CAN TAKE PO PHENERGAN Other reaction(s): Unknown   Sulfa Antibiotics Rash   Tamiflu [Oseltamivir Phosphate] Nausea And Vomiting    "I vomited within 30 minutes of taking it and was told I cannot ever take it again."   Wound Dressing Adhesive Rash    Adhesive Tape    Gabapentin     Pt states she has eye swelling    Oseltamivir     Other reaction(s): Unknown Other reaction(s): Unknown   Other Other (See Comments)    bandaids   Percocet [Oxycodone-Acetaminophen]     REACTION: severe itching. Can take by mouth codeine   Roxicet [Oxycodone-Acetaminophen]     itching    Sulfamethoxazole-Trimethoprim     Septra / Bactrim  --REACTION: rash Other reaction(s): Unknown   Fentanyl Hives and Rash    REACTION: welts    Past Medical History,  Surgical history, Social history, and Family History were reviewed and updated.  Review of Systems: All other 10 point review of systems is negative.   Physical Exam:  vitals were not taken for this visit.   Wt Readings from Last 3 Encounters:  06/19/21 211 lb (95.7 kg)  06/09/21 214 lb 1.1 oz (97.1 kg)  05/11/21 214 lb (97.1 kg)    Ocular: Sclerae unicteric, pupils equal, round and reactive to light Ear-nose-throat: Oropharynx clear, dentition fair Lymphatic: No cervical or supraclavicular adenopathy Lungs no rales or rhonchi, good excursion bilaterally Heart regular rate and rhythm, no murmur appreciated Abd soft, nontender, positive bowel sounds MSK no focal spinal tenderness, no joint edema Neuro: non-focal, well-oriented, appropriate affect Breasts: Deferred   Lab Results  Component Value Date   WBC 11.3 (H) 06/09/2021   HGB 9.4 (L) 06/09/2021   HCT 30.4 (L) 06/09/2021   MCV 81.9 06/09/2021   PLT 159 06/09/2021   Lab Results  Component Value Date   FERRITIN 13 05/11/2021   IRON 21 (L) 05/11/2021   TIBC 538 (H) 05/11/2021   UIBC 517 (H) 05/11/2021   IRONPCTSAT 4 (L) 05/11/2021   Lab Results  Component Value Date   RETICCTPCT 1.3 05/11/2021   RBC 3.71 (L) 06/09/2021   No results found for: KPAFRELGTCHN, LAMBDASER, KAPLAMBRATIO No results found for: IGGSERUM, IGA, IGMSERUM No results found for: Kathrynn Ducking, MSPIKE, SPEI   Chemistry      Component Value Date/Time   NA 133 (L) 06/09/2021 1658   NA 136 (A) 09/08/2020 0000   K 4.1 06/09/2021 1658   CL 101 06/09/2021 1658   CO2 25 06/09/2021 1658   BUN 10 06/09/2021 1658   BUN 16 09/08/2020 0000   CREATININE 0.88 06/09/2021 1658   CREATININE 0.86 05/11/2021 1435   GLU 162 09/08/2020 0000       Component Value Date/Time   CALCIUM 8.6 (L) 06/09/2021 1658   ALKPHOS 56 06/09/2021 1658   AST 37 06/09/2021 1658   AST 25 05/11/2021 1435   ALT 41 06/09/2021 1658   ALT 24 05/11/2021 1435   BILITOT 0.3 06/09/2021 1658   BILITOT 0.6 05/11/2021 1435       Impression and Plan: Ms. Convery is a very pleasant 65 yo caucasian female with anemia secondary to malabsorption s/p gastric bypass in 2018.  She had her back surgery in November and continues to recuperate.  Iron studies are pending. We will replace if needed.  Follow-up in 3 months.   Lottie Dawson, NP 12/27/20222:35 PM

## 2021-07-08 ENCOUNTER — Encounter: Payer: Self-pay | Admitting: Family

## 2021-07-08 ENCOUNTER — Other Ambulatory Visit (HOSPITAL_BASED_OUTPATIENT_CLINIC_OR_DEPARTMENT_OTHER): Payer: Self-pay

## 2021-07-08 ENCOUNTER — Telehealth: Payer: Self-pay | Admitting: *Deleted

## 2021-07-08 LAB — FERRITIN: Ferritin: 75 ng/mL (ref 11–307)

## 2021-07-08 LAB — IRON AND IRON BINDING CAPACITY (CC-WL,HP ONLY)
Iron: 22 ug/dL — ABNORMAL LOW (ref 28–170)
Saturation Ratios: 9 % — ABNORMAL LOW (ref 10.4–31.8)
TIBC: 246 ug/dL — ABNORMAL LOW (ref 250–450)
UIBC: 224 ug/dL (ref 148–442)

## 2021-07-08 NOTE — Telephone Encounter (Signed)
Message left to notify pt per order of Dr. Marin Olp that "the iron is low!!  She needs a dose of IV iron!! Pete"  Instructed pt that scheduling would call her to schedule this appt.  Message sent to scheduling.

## 2021-07-08 NOTE — Telephone Encounter (Signed)
-----   Message from Amy Napoleon, MD sent at 07/08/2021 12:35 PM EST ----- Call - the iron is low!!  She needs a dose of IV iron!!  [name]

## 2021-07-09 ENCOUNTER — Ambulatory Visit: Payer: 59 | Attending: Internal Medicine

## 2021-07-09 ENCOUNTER — Other Ambulatory Visit (HOSPITAL_BASED_OUTPATIENT_CLINIC_OR_DEPARTMENT_OTHER): Payer: Self-pay

## 2021-07-09 DIAGNOSIS — M4726 Other spondylosis with radiculopathy, lumbar region: Secondary | ICD-10-CM | POA: Diagnosis not present

## 2021-07-09 DIAGNOSIS — Z23 Encounter for immunization: Secondary | ICD-10-CM

## 2021-07-09 MED ORDER — CALCITONIN (SALMON) 200 UNIT/ACT NA SOLN
NASAL | 1 refills | Status: DC
  Filled 2021-07-09: qty 3.7, 30d supply, fill #0
  Filled 2021-09-22: qty 3.7, 30d supply, fill #1

## 2021-07-09 MED ORDER — PREGABALIN 75 MG PO CAPS: ORAL_CAPSULE | ORAL | 0 refills | Status: DC

## 2021-07-09 NOTE — Progress Notes (Signed)
° °  Covid-19 Vaccination Clinic  Name:  [name]    MRN: [id number] DOB: 1956/06/02  07/09/2021  [name] was observed post Covid-19 immunization for 15 minutes without incident. She was provided with Vaccine Information Sheet and instruction to access the V-Safe system.   [name] was instructed to call 911 with any severe reactions post vaccine: Difficulty breathing  Swelling of face and throat  A fast heartbeat  A bad rash all over body  Dizziness and weakness   Immunizations Administered     Name Date Dose VIS Date Route   Pfizer Covid-19 Vaccine Bivalent Booster 07/09/2021 12:24 PM 0.3 mL 03/11/2021 Intramuscular   Manufacturer: [place]   Lot: [id number]   [place]: [phone]

## 2021-07-10 ENCOUNTER — Other Ambulatory Visit (HOSPITAL_BASED_OUTPATIENT_CLINIC_OR_DEPARTMENT_OTHER): Payer: Self-pay

## 2021-07-10 MED ORDER — PFIZER COVID-19 VAC BIVALENT 30 MCG/0.3ML IM SUSP
INTRAMUSCULAR | 0 refills | Status: DC
  Filled 2021-07-10: qty 0.3, 1d supply, fill #0

## 2021-07-13 DIAGNOSIS — Z4789 Encounter for other orthopedic aftercare: Secondary | ICD-10-CM | POA: Diagnosis not present

## 2021-07-13 DIAGNOSIS — M4726 Other spondylosis with radiculopathy, lumbar region: Secondary | ICD-10-CM | POA: Diagnosis not present

## 2021-07-13 DIAGNOSIS — M4316 Spondylolisthesis, lumbar region: Secondary | ICD-10-CM | POA: Diagnosis not present

## 2021-07-13 DIAGNOSIS — M4317 Spondylolisthesis, lumbosacral region: Secondary | ICD-10-CM | POA: Diagnosis not present

## 2021-07-13 DIAGNOSIS — M48062 Spinal stenosis, lumbar region with neurogenic claudication: Secondary | ICD-10-CM | POA: Diagnosis not present

## 2021-07-14 ENCOUNTER — Other Ambulatory Visit: Payer: Self-pay

## 2021-07-14 ENCOUNTER — Telehealth: Payer: Self-pay | Admitting: Family Medicine

## 2021-07-14 ENCOUNTER — Ambulatory Visit (HOSPITAL_COMMUNITY)
Admission: RE | Admit: 2021-07-14 | Discharge: 2021-07-14 | Disposition: A | Discharge status: Home / Self Care | Payer: 59 | Source: Ambulatory Visit | Attending: Internal Medicine | Admitting: Internal Medicine

## 2021-07-14 ENCOUNTER — Other Ambulatory Visit: Payer: Self-pay | Admitting: Family

## 2021-07-14 DIAGNOSIS — I771 Stricture of artery: Secondary | ICD-10-CM | POA: Insufficient documentation

## 2021-07-14 DIAGNOSIS — I951 Orthostatic hypotension: Secondary | ICD-10-CM | POA: Insufficient documentation

## 2021-07-14 NOTE — Telephone Encounter (Signed)
Pt came and dropped off forms to be filled out  Placed into lowne bin up front  Pt said to call them  OR fax paper

## 2021-07-14 NOTE — Telephone Encounter (Signed)
Recieved

## 2021-07-15 ENCOUNTER — Inpatient Hospital Stay: Payer: 59 | Attending: Family

## 2021-07-15 ENCOUNTER — Encounter: Payer: Self-pay | Admitting: *Deleted

## 2021-07-15 ENCOUNTER — Other Ambulatory Visit (HOSPITAL_BASED_OUTPATIENT_CLINIC_OR_DEPARTMENT_OTHER): Payer: Self-pay

## 2021-07-15 VITALS — BP 139/55 | HR 79 | Temp 98.5°F | Resp 17

## 2021-07-15 DIAGNOSIS — D509 Iron deficiency anemia, unspecified: Secondary | ICD-10-CM

## 2021-07-15 DIAGNOSIS — D508 Other iron deficiency anemias: Secondary | ICD-10-CM | POA: Diagnosis not present

## 2021-07-15 MED ORDER — SODIUM CHLORIDE 0.9 % IV SOLN
1000.0000 mg | Freq: Once | INTRAVENOUS | Status: AC
  Administered 2021-07-15: 1000 mg via INTRAVENOUS
  Filled 2021-07-15: qty 10

## 2021-07-15 MED ORDER — OXYCODONE-ACETAMINOPHEN 7.5-325 MG PO TABS
ORAL_TABLET | ORAL | 0 refills | Status: DC
  Filled 2021-07-15: qty 90, 30d supply, fill #0

## 2021-07-15 MED ORDER — SODIUM CHLORIDE 0.9 % IV SOLN: Freq: Once | INTRAVENOUS | Status: AC

## 2021-07-15 NOTE — Progress Notes (Signed)
Disability papers faxed to [facility] and scanned to chart

## 2021-07-15 NOTE — Patient Instructions (Signed)

## 2021-07-17 ENCOUNTER — Other Ambulatory Visit: Payer: Self-pay | Admitting: Family Medicine

## 2021-07-17 ENCOUNTER — Other Ambulatory Visit (HOSPITAL_BASED_OUTPATIENT_CLINIC_OR_DEPARTMENT_OTHER): Payer: Self-pay

## 2021-07-17 DIAGNOSIS — Z0279 Encounter for issue of other medical certificate: Secondary | ICD-10-CM

## 2021-07-17 MED ORDER — OZEMPIC (0.25 OR 0.5 MG/DOSE) 2 MG/1.5ML ~~LOC~~ SOPN
0.2500 mg | PEN_INJECTOR | SUBCUTANEOUS | 2 refills | Status: DC
  Filled 2021-07-17 – 2021-07-20 (×2): qty 1.5, 56d supply, fill #0

## 2021-07-20 ENCOUNTER — Other Ambulatory Visit (HOSPITAL_BASED_OUTPATIENT_CLINIC_OR_DEPARTMENT_OTHER): Payer: Self-pay

## 2021-07-20 ENCOUNTER — Encounter: Payer: Self-pay | Admitting: Family Medicine

## 2021-07-20 DIAGNOSIS — M4726 Other spondylosis with radiculopathy, lumbar region: Secondary | ICD-10-CM | POA: Diagnosis not present

## 2021-07-20 DIAGNOSIS — L570 Actinic keratosis: Secondary | ICD-10-CM | POA: Diagnosis not present

## 2021-07-20 DIAGNOSIS — Z23 Encounter for immunization: Secondary | ICD-10-CM | POA: Diagnosis not present

## 2021-07-20 DIAGNOSIS — M4317 Spondylolisthesis, lumbosacral region: Secondary | ICD-10-CM | POA: Diagnosis not present

## 2021-07-20 DIAGNOSIS — M4316 Spondylolisthesis, lumbar region: Secondary | ICD-10-CM | POA: Diagnosis not present

## 2021-07-20 DIAGNOSIS — L578 Other skin changes due to chronic exposure to nonionizing radiation: Secondary | ICD-10-CM | POA: Diagnosis not present

## 2021-07-20 DIAGNOSIS — L821 Other seborrheic keratosis: Secondary | ICD-10-CM | POA: Diagnosis not present

## 2021-07-20 DIAGNOSIS — L814 Other melanin hyperpigmentation: Secondary | ICD-10-CM | POA: Diagnosis not present

## 2021-07-20 DIAGNOSIS — D225 Melanocytic nevi of trunk: Secondary | ICD-10-CM | POA: Diagnosis not present

## 2021-07-20 DIAGNOSIS — M48062 Spinal stenosis, lumbar region with neurogenic claudication: Secondary | ICD-10-CM | POA: Diagnosis not present

## 2021-07-20 DIAGNOSIS — Z4789 Encounter for other orthopedic aftercare: Secondary | ICD-10-CM | POA: Diagnosis not present

## 2021-07-21 ENCOUNTER — Encounter: Payer: Self-pay | Admitting: Cardiology

## 2021-07-21 ENCOUNTER — Other Ambulatory Visit: Payer: Self-pay

## 2021-07-21 ENCOUNTER — Other Ambulatory Visit (HOSPITAL_BASED_OUTPATIENT_CLINIC_OR_DEPARTMENT_OTHER): Payer: Self-pay

## 2021-07-21 ENCOUNTER — Ambulatory Visit: Payer: 59 | Admitting: Cardiology

## 2021-07-21 VITALS — BP 134/60 | HR 79 | Ht 71.0 in | Wt 211.0 lb

## 2021-07-21 DIAGNOSIS — E1169 Type 2 diabetes mellitus with other specified complication: Secondary | ICD-10-CM

## 2021-07-21 DIAGNOSIS — E785 Hyperlipidemia, unspecified: Secondary | ICD-10-CM

## 2021-07-21 DIAGNOSIS — I350 Nonrheumatic aortic (valve) stenosis: Secondary | ICD-10-CM | POA: Diagnosis not present

## 2021-07-21 DIAGNOSIS — R0989 Other specified symptoms and signs involving the circulatory and respiratory systems: Secondary | ICD-10-CM | POA: Diagnosis not present

## 2021-07-21 DIAGNOSIS — I951 Orthostatic hypotension: Secondary | ICD-10-CM | POA: Diagnosis not present

## 2021-07-21 DIAGNOSIS — I771 Stricture of artery: Secondary | ICD-10-CM | POA: Diagnosis not present

## 2021-07-21 DIAGNOSIS — R55 Syncope and collapse: Secondary | ICD-10-CM | POA: Diagnosis not present

## 2021-07-21 MED ORDER — OZEMPIC (0.25 OR 0.5 MG/DOSE) 2 MG/1.5ML ~~LOC~~ SOPN
0.5000 mg | PEN_INJECTOR | SUBCUTANEOUS | 2 refills | Status: DC
  Filled 2021-07-21 (×2): qty 1.5, 28d supply, fill #0
  Filled 2021-08-22: qty 1.5, 28d supply, fill #1
  Filled 2021-09-18: qty 1.5, 28d supply, fill #2

## 2021-07-21 NOTE — Progress Notes (Signed)
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  Would not explain syncope.  No subclavian steal symptoms.      Labile hypertension (Chronic)    Difficult scenario.  Would like to try to lower pressures to be about where they are today in the 130s over 60s if not 140s over 70s.  Would not want to be more lower than that.  As such we can have her hold hydralazine for systolic pressures less than 110 mmHg and okay to take an additional dose for systolic pressures over 702 mmHg.  Continue baseline ARB dose of olmesartan 20 mg daily      Hyperlipidemia associated with type 2 diabetes mellitus ([place]) (Chronic)    Labs as of October 22 look great with LDL of 38 on current dose of rosuvastatin.  Seems to be tolerating it relatively well.  She is on Ozempic for her diabetes with recent A1c of 7.3. ->  We will defer to PCP.        Orthostatic hypotension (Chronic)    Still notes having labile hypertension with orthostatic hypotension.  Stressed importance of adequate hydration and try to avoid using diuretic which she is doing.  No longer on HCTZ.  She is only using PRN furosemide 3-4 times a month.  Plan will be to continue to take the olmesartan at current dose 20 mg daily.  She will then continue to use 25 mg twice daily hydralazine but hold if pressures less than 110 mmHg.    Continue to wear support stockings and elevate feet when possible because of back pain.       Relevant Orders   EKG 12-Lead (Completed)     Other   Syncope (Chronic)    Thankfully, doing well with no further episodes of syncope.  Adequate hydration, watching blood pressures and holding hydralazine for low pressures.  Avoid standing up quickly.  We talked about micturition syncope and how she needs to be very very careful especially after using the bathroom..      Relevant Orders   EKG 12-Lead (Completed)   ===================================  HPI:    [name] is a 66 y.o. female with a PMH of fibromyalgia, rheumatoid arthritis, labile hypertension, DM-2, depression, IBS, episodic dizziness as well as history of syncope who presents today for 45-month follow-up.  [name] was last seen on January 10, 2020: No further pass-out spells, but did note often feeling dizzy when standing up quickly.  Blood pressure usually pretty stable in the morning, it goes down in the afternoon.  May feel dizzy but no true syncope.  Maybe only mild near syncope.  Often when has not had a good night sleep. Being seen by spine surgeon for L-spine evaluation with MRI.  Noting significant back pain, limiting activity with     Recent Hospitalizations:  06/08/2021: L4-S1 decompression and fusion 06/09/2021: She has syncopal episode short after being discharged from spinal procedure earlier that day.  Apparently  this occurred while in the bathroom, going from sitting to standing.  Thought to be related to dehydration and post micturition.  Reviewed  CV studies:    The following studies were reviewed today: (if available, images/films reviewed: From Epic Chart or Care Everywhere) Carotid Dopplers 07/14/2021: Bilateral ICA bilateral vertebral and subclavian arteries as well as bilateral carotid arteries are normal.  Interval History:   [name] presents today overall doing pretty well now since her discharge.  She does recall that the morning of her discharge-postop day 1 at the end of  November that she was under a lot of pain and was supposed to have been given more IV fluids.  She felt somewhat unsteady on discharge, and at about 2 afternoon.  She got home and then her syncopal event was when she was in the bathroom.  Thankfully, no further injury was suffered.  Apparently she use the walker to get to the bathroom, and after using the restroom she tried to stand up felt lightheaded and passed out. She was holding her antihypertensives for a week or 2 until her blood pressure stabilized out above 110 mmHg.  She then restarted her Benicar and gradually restarted back taking the hydralazine, making sure that she held it for blood pressures less than 110 mmHg.   She has a prescription for PRN furosemide 20 mg which she says she is taken maybe 3 or 4 times in the last month if she gets a little swelling.  She is gradually try to increase level of activity, but last summer was told by her employer that she was being separated due to medical issues.  She was actually told to medically retire, but clearly she did not want to retire.  She therefore is now trying to go through the process of going for short-term to long-term disability.  She has finished the paperwork for Social Security disability and now has to do the paperwork for long-term disability.  She denies any rapid irregular heartbeats palpitations.  She denies any chest pain or pressure with rest or exertion.  She does not do a lot of walking and therefore denies any significant exertional dyspnea.  No PND, orthopnea with trivial edema.  No further syncope.  When her syncopal episode happened, she denies any rapid, irregular heartbeats, or palpitations.  CV Review of Symptoms (Summary) Cardiovascular ROS: positive for - loss of consciousness and -only that one time.  Otherwise has been doing okay.  Rare end of day swelling.  Very limited activity because of back pain and unsteady gait.. negative for - chest pain, dyspnea on exertion,  irregular heartbeat, orthopnea, palpitations, paroxysmal nocturnal dyspnea, rapid heart rate, shortness of breath, or any further episodes of syncope or near syncope, TIA or amaurosis fugax, claudication  REVIEWED OF SYSTEMS   Review of Systems  Constitutional:  Positive for malaise/fatigue (Low energy because of deconditioning from back pain.). Negative for weight loss.  HENT:  Negative for congestion and nosebleeds.   Respiratory:  Negative for cough and shortness of breath.   Cardiovascular:        Per HPI  Gastrointestinal:  Negative for blood in stool and melena.  Genitourinary:  Negative for hematuria.  Musculoskeletal:  Positive for back pain and joint pain (Also hip and shoulder pain).  Neurological:  Positive for dizziness (Per HPI.  Mostly orthostatic.) and focal weakness (Legs and get sore/painful from sacroiliitis and sciatic pain.).  Psychiatric/Behavioral:  Negative for depression (Dysthymia.  Not fully depressed.) and memory  loss. The patient is nervous/anxious and has insomnia.    I have reviewed and (if needed) personally updated the patient's problem list, medications, allergies, past medical and surgical history, social and family history.   PAST MEDICAL HISTORY   Past Medical History:  Diagnosis Date   Allergic rhinitis    Anxiety    Asthma    hx bronchial asthma at times with upper resp infection   Depression    Fibromyalgia    GERD (gastroesophageal reflux disease)    Headache(784.0)    migraine and cluster headaches   Hyperlipemia    Hypertension    IBS (irritable bowel syndrome)    Menopause    Neuromuscular disorder ([place]) 2009   hx of fibromyalgia, polyarthralsia (surgery induced)   Osteoarthritis    Rheumatoid arthritis ([place])    Complicated by osteoarthritis as well.   Rheumatoid arthritis ([place])    Stress incontinence    at times   Tibial fracture 10/14/2016   evulsion periostial right   Type 2 diabetes mellitus with complication, without  long-term current use of insulin (HCC)    diet controlled no meds -> however was diagnosed with a diabetic foot ulcer.    PAST SURGICAL HISTORY   Past Surgical History:  Procedure Laterality Date   ABDOMINAL HYSTERECTOMY  12/1999   complete   CARPAL TUNNEL RELEASE Right 12/27/2017   Procedure: CARPAL TUNNEL RELEASE;  Surgeon: Roseanne Kaufman, MD;  Location: [facility];  Service: Orthopedics;  Laterality: Right;   CERVICAL CONIZATION W/BX  june 1990   dysplasia of cervix/used 5Fu cream for 3 months   CERVICAL LAMINECTOMY  2005 & 2009   X 2   NO ROM [place]   X 2   CHOLECYSTECTOMY  1986   DILATION AND CURETTAGE OF UTERUS   [phone]   missed abortion   ESOPHAGOGASTRODUODENOSCOPY  06/29/2011   Procedure: ESOPHAGOGASTRODUODENOSCOPY (EGD);  Surgeon: Shann Medal, MD;  Location: [facility];  Service: General;  Laterality: N/A;   FOOT SURGERY  2005   RT HEEL   GASTRIC BANDING PORT REVISION  09/24/2011   Procedure: GASTRIC BANDING PORT REVISION;  Surgeon: Pedro Earls, MD;  Location: [facility];  Service: General;  Laterality: N/A;   GASTRIC ROUX-EN-Y N/A 06/06/2017   Procedure: Conversion from [place]leeve to [place];  Surgeon: Johnathan Hausen, MD;  Location: [facility];  Service: General;  Laterality: N/A;   [place]  12/30/09   LAPAROSCOPIC GASTRIC SLEEVE RESECTION N/A 07/02/2013   Procedure: LAPAROSCOPIC GASTRIC SLEEVE RESECTION upper endoscopy;  Surgeon: Pedro Earls, MD;  [facility]: [facility];  Service: General;  Laterality: N/A;   LAPAROSCOPIC REPAIR AND REMOVAL OF GASTRIC BAND N/A 07/02/2013   Procedure: LAPAROSCOPIC REMOVAL OF GASTRIC BAND;  Surgeon: Pedro Earls, MD;  [facility];  Service: General;  Laterality: N/A;   LAPAROSCOPIC REVISION OF GASTRIC BAND  07/03/2012   Procedure: LAPAROSCOPIC REVISION OF GASTRIC BAND;  Surgeon: Pedro Earls, MD;  Location:  [facility];  Service: General;  Laterality: N/A;  removal of old lap. band port, replaced with AP standard band   LAPAROSCOPY  09/24/2011   Procedure: LAPAROSCOPY DIAGNOSTIC;  Surgeon: Pedro Earls, MD;  [facility]: [facility];  Service: General;  Laterality: N/A;   LAPAROSCOPY  07/03/2012   Procedure: LAPAROSCOPY DIAGNOSTIC;  Surgeon: Pedro Earls, MD;  [facility];  Service: General;  Laterality:  N/A;  Exploratory Laparoscopy    MESH APPLIED TO LAP PORT  07/03/2012   Procedure: MESH APPLIED TO LAP PORT;  Surgeon: Pedro Earls, MD;  [facility];  Service: General;  Laterality: N/A;   [place]   X 2   right knee  1981   ARTHROSCOPY AND ARTHROTOMY   right knee arthroscopy and arthrotomy  12-1979   [place]   WITH C -SECTION    Immunization History  Administered Date(s) Administered   H1N1 07/23/2008   Influenza Whole 04/24/2008   Influenza,inj,Quad PF,6+ Mos 04/11/2013, 04/04/2019, 04/21/2021   Influenza,inj,quad, With Preservative 03/25/2020   Influenza-Unspecified 04/25/2014, 03/13/2015, 03/12/2016, 03/25/2020   PFIZER Comirnaty(Gray Top)Covid-19 Tri-Sucrose Vaccine 03/08/2020, 10/20/2020   PFIZER(Purple Top)SARS-COV-2 Vaccination 07/11/2019, 07/31/2019, 03/08/2020   Pfizer Covid-19 Vaccine Bivalent Booster 40yrs & up 07/09/2021   Pneumococcal Polysaccharide-23 07/04/2013   Td 01/02/2009   Tdap 10/04/2018   Zoster Recombinat (Shingrix) 03/21/2017, 05/25/2017    MEDICATIONS/ALLERGIES   Current Meds  Medication Sig   acetaminophen (TYLENOL) 650 MG CR tablet Take 1,300 mg by mouth as needed.   albuterol (VENTOLIN HFA) 108 (90 Base) MCG/ACT inhaler Inhale 2 puffs by mouth into the lungs every 6 (six) hours as needed for wheezing or shortness of breath.   Ca Phosphate-Cholecalciferol (CALTRATE GUMMY BITES PO) Take 2 each 2 (two) times daily by mouth.    calcitonin, salmon, (MIACALCIN/FORTICAL) 200 UNIT/ACT nasal  spray Instill 1 spray by nasal route every day, alternating nostrils.   Cholecalciferol 125 MCG (5000 UT) TABS take 1 tablet by oral route every day   [EXPIRED] conjugated estrogens (PREMARIN) vaginal cream INSERT 1 GRAM OF CREAM PER VAGINA THREE TIMES A WEEK BY VAGINAL ROUTE.   COVID-19 mRNA bivalent vaccine, Pfizer, (PFIZER COVID-19 VAC BIVALENT) injection Inject into the muscle.   cyclobenzaprine (FLEXERIL) 10 MG tablet Take 1 tablet (10 mg total) by mouth at bedtime for muscle spasms.   diclofenac Sodium (VOLTAREN) 1 % GEL Apply 2 g topically See admin instructions.   DULoxetine (CYMBALTA) 30 MG capsule Take 1 capsule (30 mg total) by mouth 2 (two) times daily.   estradiol (DOTTI) 0.1 MG/24HR patch Place 1 patch (0.1 mg total) onto the skin 2 (two) times a week.   fluticasone (FLOVENT HFA) 110 MCG/ACT inhaler Inhale 2 puffs by mouth into the lungs 2 (two) times daily.   furosemide (LASIX) 20 MG tablet TAKE 1 TABLET BY MOUTH EVERY MORNING   Galcanezumab-gnlm (EMGALITY) 120 MG/ML SOAJ INJECT 120 MG INTO THE SKIN EVERY 28 (TWENTY-EIGHT) DAYS.   glucose blood test strip Test blood sugar once daily. Dx Code: E11.9   hydrALAZINE (APRESOLINE) 25 MG tablet Take 1 tablet (25 mg total) by mouth 2 (two) times daily. Hold if feeling dizzy or SBP<110 mmHg).  Can use additional 25 mg as needed for SBP>150 mmHg   Insulin Pen Needle (TECHLITE PEN NEEDLES) 32G X 6 MM MISC Use with Ozempic as directed   meclizine (ANTIVERT) 25 MG tablet Take 1 tablet (25 mg total) by mouth 3 (three) times daily as needed for dizziness.   methocarbamol (ROBAXIN) 500 MG tablet TAKE 1 TABLET BY MOUTH EVERY 6 HOURS AS NEEDED FOR MUSCLE SPASMS   Multiple Vitamins-Minerals (CENTRUM ADULTS) TABS take 2 tablets by oral route  every day with food   NONFORMULARY OR COMPOUNDED ITEM bp cuff  Dx hypertension   [EXPIRED] nystatin (MYCOSTATIN/NYSTOP) powder APPLY 1 APPLICATION TOPICALLY 3 (  THREE) TIMES DAILY.   nystatin powder Apply 1  application topically 3 (three) times daily.   olmesartan (BENICAR) 20 MG tablet Take 1 tablet (20 mg total) by mouth daily with supper.   oxyCODONE-acetaminophen (PERCOCET) 7.5-325 MG tablet Take 1 tablet by mouth 3 times a day as needed for pain   pantoprazole (PROTONIX) 40 MG tablet Take 1 tablet (40 mg total) by mouth daily.   potassium chloride (KLOR-CON) 10 MEQ tablet TAKE 1 TABLET (10 MEQ TOTAL) BY MOUTH DAILY.   pregabalin (LYRICA) 150 MG capsule Take 1 capsule (150 mg total) by mouth 2 (two) times daily.   pregabalin (LYRICA) 75 MG capsule Take 1 capsule by mouth in the morning and in the afternoon   rosuvastatin (CRESTOR) 20 MG tablet TAKE 1 TABLET BY MOUTH ONCE DAILY   temazepam (RESTORIL) 30 MG capsule Take 1 capsule (30 mg total) by mouth at bedtime as needed for sleep.   thiamine (VITAMIN B-1) 100 MG tablet Take 100 mg daily by mouth.   tocilizumab (ACTEMRA) 400 MG/20ML SOLN injection Inject 400 mg into the vein every 30 (thirty) days.   Ubrogepant (UBRELVY) 100 MG TABS Take by mouth.   [DISCONTINUED] Efinaconazole (JUBLIA) 10 % SOLN Apply to affected toenails once daily for 48 weeks.   [DISCONTINUED] hydroxychloroquine (PLAQUENIL) 200 MG tablet Take 2 tablets by mouth once daily   [DISCONTINUED] Semaglutide,0.25 or 0.5MG /DOS, (OZEMPIC, 0.25 OR 0.5 MG/DOSE,) 2 MG/1.5ML SOPN Inject 0.25 mg into the skin once a week.    Allergies  Allergen Reactions   Gabapentin Swelling    Pt states she has eye swelling    Isoniazid     Severe SOB   Morphine Hives and Nausea And Vomiting    Allergic to PCA pump only    Nitrofurantoin Shortness Of Breath, Rash and Other (See Comments)    REACTION: welts   Oseltamivir Phosphate Nausea And Vomiting    "I vomited within 30 minutes of taking it and was told I cannot ever take it again."   Oxycodone Hcl Hives and Itching   Tamiflu [Oseltamivir Phosphate] Nausea And Vomiting    "I vomited within 30 minutes of taking it and was told I cannot ever  take it again."   Hctz [Hydrochlorothiazide] Rash   Macrolides And Ketolides Rash   Percocet [Oxycodone-Acetaminophen] Itching   Promethazine Hcl Other (See Comments)    IF GIVEN  IV-hallucinations     CAN TAKE PO PHENERGAN Other reaction(s): Unknown   Roxicet [Oxycodone-Acetaminophen] Itching   Sulfa Antibiotics Rash   Wound Dressing Adhesive Rash    Adhesive Tape    Fentanyl Hives and Rash    REACTION: welts   Oseltamivir Other (See Comments)    Other reaction(s): Unknown    Other Rash and Other (See Comments)    bandaids   Sulfamethoxazole-Trimethoprim Rash    SOCIAL HISTORY/FAMILY HISTORY   Reviewed in Epic:  Pertinent findings:  Social History   Tobacco Use   Smoking status: Never   Smokeless tobacco: Never  Vaping Use   Vaping Use: Never used  Substance Use Topics   Alcohol use: Not Currently   Drug use: Never   Social History   Social History Narrative   Right handed   Two story home   Drinks caffeine occasionally    OBJCTIVE -PE, EKG, labs   Wt Readings from Last 3 Encounters:  07/21/21 211 lb (95.7 kg)  07/07/21 209 lb 12.8 oz (95.2 kg)  06/19/21 211 lb (95.7 kg)  Physical Exam: BP 134/60    Pulse 79    Ht 5\' 11"  (1.803 m)    Wt 211 lb (95.7 kg)    SpO2 99%    BMI 29.43 kg/m  Physical Exam Vitals reviewed.  Constitutional:      Appearance: Normal appearance.     Comments: Borderline obese.  Otherwise healthy appearing.  Well-nourished and well-groomed.  HENT:     Head: Normocephalic and atraumatic.  Neck:     Vascular: No carotid bruit or JVD.  Cardiovascular:     Rate and Rhythm: Normal rate and regular rhythm. No extrasystoles are present.    Chest Wall: PMI is not displaced.     Pulses: Normal pulses.     Heart sounds: S1 normal and S2 normal. Murmur (1/6 SEM at RUSB) heard.    No friction rub. No gallop.  Pulmonary:     Effort: Pulmonary effort is normal.     Breath sounds: Normal breath sounds.  Musculoskeletal:        General:  Swelling (Trace bilateral ankle swelling.) present. Normal range of motion.     Cervical back: Normal range of motion and neck supple.  Skin:    General: Skin is warm and dry.     Coloration: Skin is not jaundiced or pale.  Neurological:     General: No focal deficit present.     Mental Status: She is alert and oriented to person, place, and time.     Gait: Gait abnormal.  Psychiatric:        Mood and Affect: Mood normal.        Behavior: Behavior normal.        Thought Content: Thought content normal.        Judgment: Judgment normal.     Comments: Seems to be somewhat anxious.     Adult ECG Report  Rate: 79 ;  Rhythm: normal sinus rhythm and IVCD / incomplete right bundle branch block.  Left atrial enlargement. ;   Narrative Interpretation: Stable  Recent Labs: Reviewed Lab Results  Component Value Date   CHOL 123 04/21/2021   HDL 70.40 04/21/2021   LDLCALC 38 04/21/2021   TRIG 74.0 04/21/2021   CHOLHDL 2 04/21/2021   Lab Results  Component Value Date   CREATININE 0.88 06/09/2021   BUN 10 06/09/2021   NA 133 (L) 06/09/2021   K 4.1 06/09/2021   CL 101 06/09/2021   CO2 25 06/09/2021   CBC Latest Ref Rng & Units 07/07/2021 06/09/2021 05/11/2021  WBC 4.0 - 10.5 K/uL 8.4 11.3(H) 9.1  Hemoglobin 12.0 - 15.0 g/dL 9.9(L) 9.4(L) 9.9(L)  Hematocrit 36.0 - 46.0 % 32.3(L) 30.4(L) 32.6(L)  Platelets 150 - 400 K/uL 350 159 264    Lab Results  Component Value Date   HGBA1C 7.3 (H) 04/21/2021   Lab Results  Component Value Date   TSH 2.47 04/21/2021    ==================================================  COVID-19 Education: The signs and symptoms of COVID-19 were discussed with the patient and how to seek care for testing (follow up with PCP or arrange E-visit).    I spent a total of 28 minutes with the patient spent in direct patient consultation.  Additional time spent with chart review  / charting (studies, outside notes, etc): 17 min Total Time: 45 min  Current  medicines are reviewed at length with the patient today.  (+/- concerns) none  This visit occurred during the SARS-CoV-2 public health emergency.  Safety protocols were in place, including screening  questions prior to the visit, additional usage of staff PPE, and extensive cleaning of exam room while observing appropriate contact time as indicated for disinfecting solutions.  Notice: This dictation was prepared with Dragon dictation along with smart phrase technology. Any transcriptional errors that result from this process are unintentional and may not be corrected upon review.  Studies Ordered:   Orders Placed This Encounter  Procedures   EKG 12-Lead    Patient Instructions / Medication Changes & Studies & Tests Ordered   Patient Instructions  Medication Instructions:    CONTINUE TAKING HYDRALAZINE AS YOU  ARE DOING  *If you need a refill on your cardiac medications before your next appointment, please call your pharmacy*   Lab Work: NOT NEEDED    Testing/Procedures: NOT NEEDED   Follow-Up: At [facility], you and your health needs are our priority.  As part of our continuing mission to provide you with exceptional heart care, we have created designated Provider Care Teams.  These Care Teams include your primary Cardiologist (physician) and Advanced Practice Providers (APPs -  Physician Assistants and Nurse Practitioners) who all work together to provide you with the care you need, when you need it.     Your next appointment:   12 month(s)  The format for your next appointment:   In Person  Provider:   Glenetta Hew, MD    Other Instructions   CONTINUE TO [place]!     Glenetta Hew, M.D., M.S. Interventional Cardiologist   Pager # [phone] Phone # [phone] [address] [place], [place] [address]   Thank you for choosing Heartcare at [facility]!!

## 2021-07-21 NOTE — Patient Instructions (Signed)
Medication Instructions:    CONTINUE TAKING HYDRALAZINE AS YOU  ARE DOING  *If you need a refill on your cardiac medications before your next appointment, please call your pharmacy*   Lab Work: NOT NEEDED    Testing/Procedures: NOT NEEDED   Follow-Up: At [facility], you and your health needs are our priority.  As part of our continuing mission to provide you with exceptional heart care, we have created designated Provider Care Teams.  These Care Teams include your primary Cardiologist (physician) and Advanced Practice Providers (APPs -  Physician Assistants and Nurse Practitioners) who all work together to provide you with the care you need, when you need it.     Your next appointment:   12 month(s)  The format for your next appointment:   In Person  Provider:   Glenetta Hew, MD    Other Instructions   CONTINUE TO [place]!

## 2021-07-28 ENCOUNTER — Other Ambulatory Visit (HOSPITAL_BASED_OUTPATIENT_CLINIC_OR_DEPARTMENT_OTHER): Payer: Self-pay

## 2021-07-28 MED ORDER — RIZATRIPTAN BENZOATE 10 MG PO TABS
10.0000 mg | ORAL_TABLET | ORAL | 3 refills | Status: DC | PRN
  Filled 2021-07-28: qty 9, 30d supply, fill #0

## 2021-07-28 NOTE — Addendum Note (Signed)
Addended by: Venetia Night on: 07/28/2021 10:32 AM   Modules accepted: Orders

## 2021-07-28 NOTE — Telephone Encounter (Signed)
Spoke to patient, per Pt insurance she do not meet criteria for Urblevy. Pt has to have tried and failed a triptan. Per pt chart she has not tired and failed any triptans.  Dr.Jaffe please advise what the next steps will be.

## 2021-07-28 NOTE — Telephone Encounter (Signed)
Pt advised, script for rizatriptan 10mg  - take 1 tablet as needed.  May repeat after 2 hours.  Maximum 2 tablets in 24 hours

## 2021-08-04 ENCOUNTER — Other Ambulatory Visit (HOSPITAL_BASED_OUTPATIENT_CLINIC_OR_DEPARTMENT_OTHER): Payer: Self-pay

## 2021-08-04 DIAGNOSIS — M0609 Rheumatoid arthritis without rheumatoid factor, multiple sites: Secondary | ICD-10-CM | POA: Diagnosis not present

## 2021-08-05 ENCOUNTER — Other Ambulatory Visit: Payer: Self-pay

## 2021-08-05 ENCOUNTER — Other Ambulatory Visit (HOSPITAL_BASED_OUTPATIENT_CLINIC_OR_DEPARTMENT_OTHER): Payer: Self-pay

## 2021-08-05 ENCOUNTER — Ambulatory Visit: Payer: 59 | Admitting: Podiatry

## 2021-08-05 ENCOUNTER — Other Ambulatory Visit (HOSPITAL_BASED_OUTPATIENT_CLINIC_OR_DEPARTMENT_OTHER): Payer: Self-pay | Admitting: Internal Medicine

## 2021-08-05 ENCOUNTER — Encounter: Payer: Self-pay | Admitting: Podiatry

## 2021-08-05 DIAGNOSIS — L84 Corns and callosities: Secondary | ICD-10-CM

## 2021-08-05 DIAGNOSIS — Z1211 Encounter for screening for malignant neoplasm of colon: Secondary | ICD-10-CM | POA: Insufficient documentation

## 2021-08-05 DIAGNOSIS — E1142 Type 2 diabetes mellitus with diabetic polyneuropathy: Secondary | ICD-10-CM

## 2021-08-05 DIAGNOSIS — M2012 Hallux valgus (acquired), left foot: Secondary | ICD-10-CM

## 2021-08-05 DIAGNOSIS — M2042 Other hammer toe(s) (acquired), left foot: Secondary | ICD-10-CM

## 2021-08-05 DIAGNOSIS — M2041 Other hammer toe(s) (acquired), right foot: Secondary | ICD-10-CM | POA: Diagnosis not present

## 2021-08-05 DIAGNOSIS — Z1231 Encounter for screening mammogram for malignant neoplasm of breast: Secondary | ICD-10-CM

## 2021-08-05 DIAGNOSIS — M2011 Hallux valgus (acquired), right foot: Secondary | ICD-10-CM

## 2021-08-05 DIAGNOSIS — B351 Tinea unguium: Secondary | ICD-10-CM

## 2021-08-11 ENCOUNTER — Other Ambulatory Visit: Payer: Self-pay

## 2021-08-11 ENCOUNTER — Encounter (HOSPITAL_BASED_OUTPATIENT_CLINIC_OR_DEPARTMENT_OTHER): Payer: Self-pay

## 2021-08-11 ENCOUNTER — Encounter: Payer: Self-pay | Admitting: Podiatry

## 2021-08-11 ENCOUNTER — Ambulatory Visit (HOSPITAL_BASED_OUTPATIENT_CLINIC_OR_DEPARTMENT_OTHER)
Admission: RE | Admit: 2021-08-11 | Discharge: 2021-08-11 | Disposition: A | Discharge status: Home / Self Care | Payer: 59 | Source: Ambulatory Visit | Attending: Internal Medicine | Admitting: Internal Medicine

## 2021-08-11 DIAGNOSIS — Z1231 Encounter for screening mammogram for malignant neoplasm of breast: Secondary | ICD-10-CM | POA: Insufficient documentation

## 2021-08-11 NOTE — Progress Notes (Signed)
ANNUAL DIABETIC FOOT EXAM  Subjective: [name] presents today for for annual diabetic foot examination.  Patient relates 17 year h/o diabetes.  Patient has h/o toe ulcer right 2nd digit which healed.  Patient has been diagnosed with neuropathy and it is managed with pregabalin.  Patient is recovering from back surgery.  Risk factors: diabetes, history of foot/leg ulcer, HTN.  Amy Held, DO is patient's PCP. Last visit was 04/21/2021.  Past Medical History:  Diagnosis Date   Allergic rhinitis    Anxiety    Asthma    hx bronchial asthma at times with upper resp infection   Depression    Fibromyalgia    GERD (gastroesophageal reflux disease)    Headache(784.0)    migraine and cluster headaches   Hyperlipemia    Hypertension    IBS (irritable bowel syndrome)    Menopause    Neuromuscular disorder ([place]) 2009   hx of fibromyalgia, polyarthralsia (surgery induced)   Osteoarthritis    Rheumatoid arthritis ([place])    Complicated by osteoarthritis as well.   Rheumatoid arthritis ([place])    Stress incontinence    at times   Tibial fracture 10/14/2016   evulsion periostial right   Type 2 diabetes mellitus with complication, without long-term current use of insulin (HCC)    diet controlled no meds -> however was diagnosed with a diabetic foot ulcer.   Patient Active Problem List   Diagnosis Date Noted   Colon cancer screening 08/05/2021   Diabetic ulcer of toe of right foot associated with type 2 diabetes mellitus, limited to breakdown of skin ([place]) 06/19/2021   Tinea corporis 06/19/2021   Dysuria 06/19/2021   IDA (iron deficiency anemia) 05/18/2021   Pre-operative clearance 04/21/2021   Controlled type 2 diabetes mellitus with hyperglycemia, without long-term current use of insulin ([place]) 04/21/2021   Spinal stenosis of lumbar region 04/21/2021   Bronchitis due to COVID-19 virus 03/27/2021   Orthostatic hypotension 01/09/2021   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[place]) 01/09/2021   Spondylosis of cervical spine 10/07/2020   Lumbar radiculopathy 10/07/2020   Anemia 09/11/2020   Intractable migraine 08/26/2020   Inflammatory arthritis 07/22/2020   Thrombocytopenia ([place]) 07/22/2020   Chronic nonintractable headache 07/21/2020   Syncope 07/21/2020   Dizziness 07/21/2020   Cellulitis of right hand 07/21/2020   Lumbar spondylolysis 05/13/2020   Primary hypertension 04/02/2020   Body mass index (BMI) 29.0-29.9, adult 04/02/2020   Menopausal symptom 03/11/2020   Stress 03/11/2020   Unspecified dyspareunia (CODE) 03/11/2020   Bilateral sacroiliitis ([place]) 03/04/2020   Senile calcific aortic valve sclerosis 02/08/2020   DOE (dyspnea on exertion) 02/08/2020   Murmur 01/07/2020   Acquired trigger finger of right ring finger 10/25/2019   Pain in right knee 08/24/2019   Rheumatoid arthritis involving multiple sites with positive rheumatoid factor ([place]) 08/02/2019   Primary osteoarthritis of both knees 08/02/2019   PTSD (post-traumatic stress disorder) 02/08/2019   Dog bite of arm, right, initial encounter 10/05/2018   Hyperlipidemia associated with type 2 diabetes mellitus ([place]) 08/24/2018   Symptomatic abdominal panniculus 12/12/2017   Carpal tunnel syndrome of left wrist 11/28/2017   Pain in right hand 11/08/2017   Cervical radiculopathy 10/06/2017   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   Keratosis 12/04/2014   Onychocryptosis 08/21/2014   Onychomycosis 05/21/2014   Fissured skin 01/22/2014   Fissure in skin of foot 12/18/2013   Porokeratosis 12/18/2013   Pain in lower limb 11/21/2013   Ingrown nail 07/20/2013  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 07/02/2013   Obesity (BMI 30-39.9) 06/25/2013   GERD (gastroesophageal reflux disease) 08/20/2011   Lapband APL + HH repair June 2011-Major revision Dec 2013 06/29/2011   ABDOMINAL PAIN, ACUTE 08/01/2010   VITAMIN B12 DEFICIENCY 04/01/2010   BARIATRIC SURGERY STATUS 01/29/2010    ONYCHOMYCOSIS, BILATERAL 06/30/2009   STRESS INCONTINENCE 06/10/2009   ACUTE PHARYNGITIS 04/29/2009   COUGH 04/27/2009   BRONCHITIS, ACUTE 04/16/2009   NAUSEA 04/08/2009   DIARRHEA 04/08/2009   MORBID OBESITY 03/03/2009   SKIN RASH 11/18/2008   UNSPECIFIED VITAMIN D DEFICIENCY 07/15/2008   OTHER SPECIFIED ANEMIAS 07/15/2008   Pain in joint, multiple sites 06/13/2008   Myalgia and myositis, unspecified 06/13/2008   BACK PAIN, LUMBAR 02/14/2008   ACUTE SINUSITIS, UNSPECIFIED 09/01/2007   CELLULITIS 08/12/2007   Cellulitis 08/12/2007   Depression with anxiety 06/09/2007   DEPRESSION 06/09/2007   Controlled type 2 diabetes mellitus with diabetic neuropathy, without long-term current use of insulin ([place]) 01/04/2007   Labile hypertension 01/04/2007   ALLERGIC RHINITIS 01/04/2007   HEADACHE 01/04/2007   Past Surgical History:  Procedure Laterality Date   ABDOMINAL HYSTERECTOMY  12/1999   complete   CARPAL TUNNEL RELEASE Right 12/27/2017   Procedure: CARPAL TUNNEL RELEASE;  Surgeon: Roseanne Kaufman, MD;  Location: [facility];  Service: Orthopedics;  Laterality: Right;   CERVICAL CONIZATION W/BX  june 1990   dysplasia of cervix/used 5Fu cream for 3 months   CERVICAL LAMINECTOMY  2005 & 2009   X 2   NO ROM [place]   X 2   CHOLECYSTECTOMY  1986   DILATION AND CURETTAGE OF UTERUS   [phone]   missed abortion   ESOPHAGOGASTRODUODENOSCOPY  06/29/2011   Procedure: ESOPHAGOGASTRODUODENOSCOPY (EGD);  Surgeon: Shann Medal, MD;  Location: [facility];  Service: General;  Laterality: N/A;   FOOT SURGERY  2005   RT HEEL   GASTRIC BANDING PORT REVISION  09/24/2011   Procedure: GASTRIC BANDING PORT REVISION;  Surgeon: Pedro Earls, MD;  Location: [facility];  Service: General;  Laterality: N/A;   GASTRIC ROUX-EN-Y N/A 06/06/2017   Procedure: Conversion from Sleeve to [place];  Surgeon: Johnathan Hausen, MD;   Location: [facility];  Service: General;  Laterality: N/A;   [place]  12/30/09   LAPAROSCOPIC GASTRIC SLEEVE RESECTION N/A 07/02/2013   Procedure: LAPAROSCOPIC GASTRIC SLEEVE RESECTION upper endoscopy;  Surgeon: Pedro Earls, MD;  Location: [facility];  Service: General;  Laterality: N/A;   LAPAROSCOPIC REPAIR AND REMOVAL OF GASTRIC BAND N/A 07/02/2013   Procedure: LAPAROSCOPIC REMOVAL OF GASTRIC BAND;  Surgeon: Pedro Earls, MD;  Location[facility];  Service: General;  Laterality: N/A;   LAPAROSCOPIC REVISION OF GASTRIC BAND  07/03/2012   Procedure: LAPAROSCOPIC REVISION OF GASTRIC BAND;  Surgeon: Pedro Earls, MD;  Location: [facility];  Service: General;  Laterality: N/A;  removal of old lap. band port, replaced with AP standard band   LAPAROSCOPY  09/24/2011   Procedure: LAPAROSCOPY DIAGNOSTIC;  Surgeon: Pedro Earls, MD;  Location: [facility];  Service: General;  Laterality: N/A;   LAPAROSCOPY  07/03/2012   Procedure: LAPAROSCOPY DIAGNOSTIC;  Surgeon: Pedro Earls, MD;  Location: [facility];  Service: General;  Laterality: N/A;  Exploratory Laparoscopy    MESH APPLIED TO LAP PORT  07/03/2012   Procedure: MESH APPLIED TO LAP PORT;  Surgeon: Pedro Earls, MD;  Location: [facility];  Service: General;  Laterality: N/A;   [place]   X 2   right knee  1981   ARTHROSCOPY AND ARTHROTOMY   right knee arthroscopy and arthrotomy  12-1979   [place]   WITH C -SECTION   Current Outpatient Medications on File Prior to Visit  Medication Sig Dispense Refill   acetaminophen (TYLENOL) 650 MG CR tablet Take 1,300 mg by mouth as needed.     albuterol (VENTOLIN HFA) 108 (90 Base) MCG/ACT inhaler Inhale 2 puffs by mouth into the lungs every 6 (six) hours as needed for wheezing or shortness of breath. 18 g 0   Ca Phosphate-Cholecalciferol (CALTRATE GUMMY BITES PO) Take 2 each 2 (two) times daily by mouth.       calcitonin, salmon, (MIACALCIN/FORTICAL) 200 UNIT/ACT nasal spray Instill 1 spray by nasal route every day, alternating nostrils. 3.7 mL 1   Cholecalciferol 125 MCG (5000 UT) TABS take 1 tablet by oral route every day     conjugated estrogens (PREMARIN) vaginal cream INSERT 1 GRAM OF CREAM PER VAGINA THREE TIMES A WEEK BY VAGINAL ROUTE. 30 g 5   COVID-19 mRNA bivalent vaccine, Pfizer, (PFIZER COVID-19 VAC BIVALENT) injection Inject into the muscle. 0.3 mL 0   cyclobenzaprine (FLEXERIL) 10 MG tablet Take 1 tablet (10 mg total) by mouth at bedtime for muscle spasms. 30 tablet 0   diclofenac Sodium (VOLTAREN) 1 % GEL Apply 2 g topically See admin instructions.     DULoxetine (CYMBALTA) 30 MG capsule Take 1 capsule (30 mg total) by mouth 2 (two) times daily. 180 capsule 1   estradiol (DOTTI) 0.1 MG/24HR patch Place 1 patch (0.1 mg total) onto the skin 2 (two) times a week. 24 patch 2   fexofenadine (ALLEGRA ALLERGY) 180 MG tablet Take 1 tablet (180 mg total) by mouth daily. 30 tablet 0   fluticasone (FLOVENT HFA) 110 MCG/ACT inhaler Inhale 2 puffs by mouth into the lungs 2 (two) times daily. 12 g 12   furosemide (LASIX) 20 MG tablet TAKE 1 TABLET BY MOUTH EVERY MORNING 90 tablet 1   Galcanezumab-gnlm (EMGALITY) 120 MG/ML SOAJ INJECT 120 MG INTO THE SKIN EVERY 28 (TWENTY-EIGHT) DAYS. 1 mL 5   glucose blood test strip Test blood sugar once daily. Dx Code: E11.9 100 each 12   hydrALAZINE (APRESOLINE) 25 MG tablet Take 1 tablet (25 mg total) by mouth 2 (two) times daily. Hold if feeling dizzy or SBP<110 mmHg).  Can use additional 25 mg as needed for SBP>150 mmHg 180 tablet 3   hydroxychloroquine (PLAQUENIL) 200 MG tablet Take 2 tablets by mouth once daily 60 tablet 3   Insulin Pen Needle (TECHLITE PEN NEEDLES) 32G X 6 MM MISC Use with Ozempic as directed 100 each 2   meclizine (ANTIVERT) 25 MG tablet Take 1 tablet (25 mg total) by mouth 3 (three) times daily as needed for dizziness. 20 tablet 0    methocarbamol (ROBAXIN) 500 MG tablet TAKE 1 TABLET BY MOUTH EVERY 6 HOURS AS NEEDED FOR MUSCLE SPASMS 120 tablet 0   Multiple Vitamins-Minerals (CENTRUM ADULTS) TABS take 2 tablets by oral route  every day with food     NONFORMULARY OR COMPOUNDED ITEM bp cuff  Dx hypertension 1 each 0   nystatin powder Apply 1 application topically 3 (three) times daily. 15 g 0   olmesartan (BENICAR) 20 MG tablet  Take 1 tablet (20 mg total) by mouth daily with supper. 90 tablet 3   oxyCODONE-acetaminophen (PERCOCET) 7.5-325 MG tablet Take 1 tablet by mouth 3 times a day as needed for pain 90 tablet 0   pantoprazole (PROTONIX) 40 MG tablet Take 1 tablet (40 mg total) by mouth daily. 90 tablet 3   potassium chloride (KLOR-CON) 10 MEQ tablet TAKE 1 TABLET (10 MEQ TOTAL) BY MOUTH DAILY. 90 tablet 1   pregabalin (LYRICA) 150 MG capsule Take 1 capsule (150 mg total) by mouth 2 (two) times daily. 60 capsule 0   pregabalin (LYRICA) 75 MG capsule Take 1 capsule by mouth in the morning and in the afternoon 60 capsule 0   rizatriptan (MAXALT) 10 MG tablet Take 1 tablet (10 mg total) by mouth as needed for migraine. take 1 tablet as needed.  May repeat after 2 hours.  Maximum 2 tablets in 24 hours. 10 tablet 3   rosuvastatin (CRESTOR) 20 MG tablet TAKE 1 TABLET BY MOUTH ONCE DAILY 90 tablet 1   Semaglutide,0.25 or 0.5MG /DOS, (OZEMPIC, 0.25 OR 0.5 MG/DOSE,) 2 MG/1.5ML SOPN Inject 0.5 mg into the skin once a week. 1.5 mL 2   temazepam (RESTORIL) 30 MG capsule Take 1 capsule (30 mg total) by mouth at bedtime as needed for sleep. 90 capsule 0   thiamine (VITAMIN B-1) 100 MG tablet Take 100 mg daily by mouth.     tocilizumab (ACTEMRA) 400 MG/20ML SOLN injection Inject 400 mg into the vein every 30 (thirty) days.     Ubrogepant (UBRELVY) 100 MG TABS Take by mouth.     No current facility-administered medications on file prior to visit.    Allergies  Allergen Reactions   Gabapentin Swelling    Pt states she has eye swelling     Isoniazid     Severe SOB   Morphine Hives and Nausea And Vomiting    Allergic to PCA pump only    Nitrofurantoin Shortness Of Breath, Rash and Other (See Comments)    REACTION: welts   Oseltamivir Phosphate Nausea And Vomiting    "I vomited within 30 minutes of taking it and was told I cannot ever take it again."   Oxycodone Hcl Hives and Itching   Tamiflu [Oseltamivir Phosphate] Nausea And Vomiting    "I vomited within 30 minutes of taking it and was told I cannot ever take it again."   Hctz [Hydrochlorothiazide] Rash   Macrolides And Ketolides Rash   Percocet [Oxycodone-Acetaminophen] Itching   Promethazine Hcl Other (See Comments)    IF GIVEN  IV-hallucinations     CAN TAKE PO PHENERGAN Other reaction(s): Unknown   Roxicet [Oxycodone-Acetaminophen] Itching   Sulfa Antibiotics Rash   Wound Dressing Adhesive Rash    Adhesive Tape    Fentanyl Hives and Rash    REACTION: welts   Oseltamivir Other (See Comments)    Other reaction(s): Unknown    Other Rash and Other (See Comments)    bandaids   Sulfamethoxazole-Trimethoprim Rash   Social History   Occupational History   Occupation: Programmer, multimedia: [employer]  Tobacco Use   Smoking status: Never   Smokeless tobacco: Never  Vaping Use   Vaping Use: Never used  Substance and Sexual Activity   Alcohol use: Not Currently   Drug use: Never   Sexual activity: Yes    Birth control/protection: None   Family History  Problem Relation Age of Onset   Diabetes Mother  Stroke Mother    Breast cancer Mother    Atrial fibrillation Mother    Hypertension Mother    Cancer Mother 65       breast   Diabetes Father    Stroke Father 51       2nd 74 month apart   Hypertension Father    Heart attack Father 41       stents   Dementia Father    AAA (abdominal aortic aneurysm) Father 13       repair   CAD Father 37   Thyroid disease Other    Heart disease Other    COPD Other    Immunization History   Administered Date(s) Administered   H1N1 07/23/2008   Influenza Whole 04/24/2008   Influenza,inj,Quad PF,6+ Mos 04/11/2013, 04/04/2019, 04/21/2021   Influenza,inj,quad, With Preservative 03/25/2020   Influenza-Unspecified 04/25/2014, 03/13/2015, 03/12/2016, 03/25/2020   PFIZER Comirnaty(Gray Top)Covid-19 Tri-Sucrose Vaccine 03/08/2020, 10/20/2020   PFIZER(Purple Top)SARS-COV-2 Vaccination 07/11/2019, 07/31/2019, 03/08/2020   Pfizer Covid-19 Vaccine Bivalent Booster 20yrs & up 07/09/2021   Pneumococcal Polysaccharide-23 07/04/2013   Td 01/02/2009   Tdap 10/04/2018   Zoster Recombinat (Shingrix) 03/21/2017, 05/25/2017     Review of Systems: Negative except as noted in the HPI.   Objective: There were no vitals filed for this visit.  [name] is a pleasant 66 y.o. female in NAD. AAO X 3.  Vascular Examination: CFT <3 seconds b/l LE. Palpable DP/PT pulses b/l LE. Digital hair absent b/l. Skin temperature gradient WNL b/l. No pain with calf compression b/l. No edema noted b/l. No cyanosis or clubbing noted b/l LE.  Dermatological Examination: Pedal integument with normal turgor, texture and tone BLE. No open wounds b/l LE. No interdigital macerations noted b/l LE. Toenails recently debrided. Hyperkeratotic lesion(s) R 3rd toe.  No erythema, no edema, no drainage, no fluctuance.  Musculoskeletal Examination: Inversion deformity of right ankle. Clawtoe deformity R 3rd toe, R 4th toe, and R 5th toe.  Footwear Assessment: Does the patient wear appropriate shoes? Yes. Does the patient need inserts/orthotics? Yes.  Neurological Examination: Pt has subjective symptoms of neuropathy. Protective sensation intact 5/5 intact bilaterally with 10g monofilament b/l.  Hemoglobin A1C Latest Ref Rng & Units 04/21/2021  HGBA1C 4.6 - 6.5 % 7.3(H)  Some recent data might be hidden   Assessment: 1. Corns   2. Acquired hammertoes of both feet   3. Hallux valgus, acquired, bilateral   4.  Diabetic peripheral neuropathy associated with type 2 diabetes mellitus (HCC)     ADA Risk Categorization: High Risk  Patient has one or more of the following: Loss of protective sensation Absent pedal pulses Severe Foot deformity History of foot ulcer  Plan: -She would like a new Rx for [place] 10% Solution. Will send this Rx to [facility]. -Diabetic foot examination performed today. -Continue foot and shoe inspections daily. Monitor blood glucose per PCP/Endocrinologist's recommendations. -Corn(s) R 2nd toe pared utilizing sterile scalpel blade without complication or incident. Total number debrided=1. -Patient/POA to call should there be question/concern in the interim.  Return in about 3 months (around 11/03/2021).  [name], DPM

## 2021-08-12 ENCOUNTER — Other Ambulatory Visit (HOSPITAL_BASED_OUTPATIENT_CLINIC_OR_DEPARTMENT_OTHER): Payer: Self-pay

## 2021-08-12 MED ORDER — PREGABALIN 75 MG PO CAPS
ORAL_CAPSULE | ORAL | 0 refills | Status: DC
  Filled 2021-08-12: qty 120, 30d supply, fill #0

## 2021-08-17 DIAGNOSIS — M2550 Pain in unspecified joint: Secondary | ICD-10-CM | POA: Diagnosis not present

## 2021-08-17 DIAGNOSIS — M549 Dorsalgia, unspecified: Secondary | ICD-10-CM | POA: Diagnosis not present

## 2021-08-17 DIAGNOSIS — M0609 Rheumatoid arthritis without rheumatoid factor, multiple sites: Secondary | ICD-10-CM | POA: Diagnosis not present

## 2021-08-17 DIAGNOSIS — G8929 Other chronic pain: Secondary | ICD-10-CM | POA: Diagnosis not present

## 2021-08-17 DIAGNOSIS — M79643 Pain in unspecified hand: Secondary | ICD-10-CM | POA: Diagnosis not present

## 2021-08-17 DIAGNOSIS — Z79899 Other long term (current) drug therapy: Secondary | ICD-10-CM | POA: Diagnosis not present

## 2021-08-17 DIAGNOSIS — M797 Fibromyalgia: Secondary | ICD-10-CM | POA: Diagnosis not present

## 2021-08-17 DIAGNOSIS — M1990 Unspecified osteoarthritis, unspecified site: Secondary | ICD-10-CM | POA: Diagnosis not present

## 2021-08-17 DIAGNOSIS — M791 Myalgia, unspecified site: Secondary | ICD-10-CM | POA: Diagnosis not present

## 2021-08-18 ENCOUNTER — Other Ambulatory Visit (HOSPITAL_BASED_OUTPATIENT_CLINIC_OR_DEPARTMENT_OTHER): Payer: Self-pay

## 2021-08-18 DIAGNOSIS — Z01419 Encounter for gynecological examination (general) (routine) without abnormal findings: Secondary | ICD-10-CM | POA: Diagnosis not present

## 2021-08-18 MED ORDER — PREMARIN 0.625 MG/GM VA CREA
TOPICAL_CREAM | VAGINAL | 5 refills | Status: DC
  Filled 2021-08-18: qty 30, 84d supply, fill #0
  Filled 2021-12-23: qty 30, 84d supply, fill #1
  Filled 2022-03-13 – 2022-03-31 (×3): qty 30, 84d supply, fill #2

## 2021-08-18 MED ORDER — ESTRADIOL 0.1 MG/24HR TD PTTW
MEDICATED_PATCH | TRANSDERMAL | 4 refills | Status: DC
  Filled 2021-08-18: qty 24, 84d supply, fill #0
  Filled 2021-12-23: qty 24, 84d supply, fill #1
  Filled 2022-03-13: qty 24, 84d supply, fill #2
  Filled 2022-06-23: qty 24, 84d supply, fill #3

## 2021-08-18 MED ORDER — HYDROXYCHLOROQUINE SULFATE 200 MG PO TABS
400.0000 mg | ORAL_TABLET | Freq: Every day | ORAL | 3 refills | Status: DC
  Filled 2021-08-18: qty 60, 30d supply, fill #0
  Filled 2021-09-22: qty 60, 30d supply, fill #1
  Filled 2021-10-21: qty 60, 30d supply, fill #2
  Filled 2021-11-23: qty 60, 30d supply, fill #3

## 2021-08-18 NOTE — Telephone Encounter (Signed)
New message  Received fax from [facility]   Ubrelvy 100 mg   Effective date  2.6.23 to  8.5.23

## 2021-08-22 ENCOUNTER — Other Ambulatory Visit (HOSPITAL_BASED_OUTPATIENT_CLINIC_OR_DEPARTMENT_OTHER): Payer: Self-pay

## 2021-08-24 ENCOUNTER — Other Ambulatory Visit (HOSPITAL_BASED_OUTPATIENT_CLINIC_OR_DEPARTMENT_OTHER): Payer: Self-pay

## 2021-08-25 ENCOUNTER — Encounter: Payer: Self-pay | Admitting: Cardiology

## 2021-08-25 NOTE — Assessment & Plan Note (Signed)
Still notes having labile hypertension with orthostatic hypotension.  Stressed importance of adequate hydration and try to avoid using diuretic which she is doing.  No longer on HCTZ.  She is only using PRN furosemide 3-4 times a month.  Plan will be to continue to take the olmesartan at current dose 20 mg daily.  She will then continue to use 25 mg twice daily hydralazine but hold if pressures less than 110 mmHg.    Continue to wear support stockings and elevate feet when possible because of back pain.

## 2021-08-25 NOTE — Assessment & Plan Note (Signed)
Labs as of October 22 look great with LDL of 38 on current dose of rosuvastatin.  Seems to be tolerating it relatively well.  She is on Ozempic for her diabetes with recent A1c of 7.3. ->  We will defer to PCP.

## 2021-08-25 NOTE — Assessment & Plan Note (Signed)
No gradient seen on Dopplers ordered for earlier this month.  Would not explain syncope.  No subclavian steal symptoms.

## 2021-08-25 NOTE — Assessment & Plan Note (Signed)
Thankfully, doing well with no further episodes of syncope.  Adequate hydration, watching blood pressures and holding hydralazine for low pressures.  Avoid standing up quickly.  We talked about micturition syncope and how she needs to be very very careful especially after using the bathroom.Marland Kitchen

## 2021-08-25 NOTE — Assessment & Plan Note (Signed)
Difficult scenario.  Would like to try to lower pressures to be about where they are today in the 130s over 60s if not 140s over 70s.  Would not want to be more lower than that.  As such we can have her hold hydralazine for systolic pressures less than 110 mmHg and okay to take an additional dose for systolic pressures over 116 mmHg.  Continue baseline ARB dose of olmesartan 20 mg daily

## 2021-08-28 ENCOUNTER — Other Ambulatory Visit (HOSPITAL_BASED_OUTPATIENT_CLINIC_OR_DEPARTMENT_OTHER): Payer: Self-pay

## 2021-09-01 DIAGNOSIS — M0609 Rheumatoid arthritis without rheumatoid factor, multiple sites: Secondary | ICD-10-CM | POA: Diagnosis not present

## 2021-09-03 ENCOUNTER — Other Ambulatory Visit (HOSPITAL_BASED_OUTPATIENT_CLINIC_OR_DEPARTMENT_OTHER): Payer: Self-pay

## 2021-09-03 DIAGNOSIS — M545 Low back pain, unspecified: Secondary | ICD-10-CM | POA: Diagnosis not present

## 2021-09-03 MED ORDER — CYCLOBENZAPRINE HCL 10 MG PO TABS
ORAL_TABLET | ORAL | 2 refills | Status: DC
  Filled 2021-09-03: qty 30, 30d supply, fill #0
  Filled 2021-10-13: qty 30, 30d supply, fill #1

## 2021-09-03 MED ORDER — PREGABALIN 150 MG PO CAPS
ORAL_CAPSULE | ORAL | 2 refills | Status: DC
  Filled 2021-09-03: qty 30, 30d supply, fill #0
  Filled 2022-01-17 – 2022-02-19 (×3): qty 30, 30d supply, fill #1

## 2021-09-03 MED ORDER — METHOCARBAMOL 500 MG PO TABS
ORAL_TABLET | ORAL | 2 refills | Status: DC
  Filled 2021-09-03: qty 90, 30d supply, fill #0

## 2021-09-04 DIAGNOSIS — M4326 Fusion of spine, lumbar region: Secondary | ICD-10-CM | POA: Diagnosis not present

## 2021-09-04 DIAGNOSIS — Z7409 Other reduced mobility: Secondary | ICD-10-CM | POA: Diagnosis not present

## 2021-09-04 DIAGNOSIS — R531 Weakness: Secondary | ICD-10-CM | POA: Diagnosis not present

## 2021-09-07 ENCOUNTER — Encounter: Payer: Self-pay | Admitting: Neurology

## 2021-09-08 ENCOUNTER — Other Ambulatory Visit (HOSPITAL_BASED_OUTPATIENT_CLINIC_OR_DEPARTMENT_OTHER): Payer: Self-pay

## 2021-09-08 ENCOUNTER — Other Ambulatory Visit: Payer: Self-pay | Admitting: Neurology

## 2021-09-08 MED ORDER — UBRELVY 100 MG PO TABS
1.0000 | ORAL_TABLET | ORAL | 0 refills | Status: DC | PRN
  Filled 2021-09-08: qty 16, 8d supply, fill #0

## 2021-09-08 NOTE — Progress Notes (Signed)
NEUROLOGY FOLLOW UP OFFICE NOTE  [name] [id number]  Assessment/Plan:   Migraines. Dizziness with episode of syncope.  Secondary to vasovagal/orthostatic hypotension - followed by cardiology Hypertension   Migraine prevention:  Emgality Migraine rescue:  Ubrelvy Limit use of pain relievers to no more than 2 days out of week to prevent risk of rebound or medication-overuse headache. Keep headache diary Follow up with cardiology regarding blood pressure Follow up 6 months.     Subjective:  Amy Downs. Skinner is a 66 year old female with fibromyalgia, RA, HTN, IBS, depression and type 2 diabetes who follows up for migraine and recurrent syncope   UPDATE: She had another syncopal spell in November.  She was at the sink washing her hands after using the commode when she passed out.  No incontinence.  Blood pressure was 97/40.  She was hypovolemic and advised to keep hydrated.  She also found out she has iron-deficiency anemia requiring infusions.  She had L4-S1 decompression in November.  She had transient right foot drop which improved.   Migraines aggravated by back pain - 2 hours with Roselyn Meier - 6 in December right after surgery - 4 in January, 3 in February Typically, migraines have been: Intensity:  moderate-severe Duration:  2 hours Frequency:  1 a month Current NSAIDS/analgesics:  Tylenol, tramadol Current triptans:  none Current ergotamine:  none Current anti-emetic:  none Current muscle relaxants:  Flexeril, Robaxin 500mg  Current Antihypertensive medications:  olmesartan Current Antidepressant medications:  Cymbalta 30mg  BID Current Anticonvulsant medications:  none Current anti-CGRP:  Emgality, Ubrelvy 100mg  Current Vitamins/Herbal/Supplements:  B1, KCl, MVI Current Antihistamines/Decongestants:  Meclizine, Claritin Other therapy:  none Hormone/birth control:  estradiol Other medications:  Plaquenil, alprazolam, Restoril     HISTORY:  In 2021, she was  tried on different immunosuppressant therapies for her RA.  In addition to her RA pain, she has fibromyalgia, osteoarthritis, lumbosacral spondylosis and sacroiliac pain, for which she is managed by pain medicine.  She began experiencing daily headaches in July.  The following month, she began experiencing dizziness.  She was found to have a new murmur and was referred to cardiology.  Echocardiogram in August was normal with EF 60-65%.  Murmur was thought to be secondary to mild aortic valve sclerosis.  The dizziness occurs spontaneously and not positional.  She will experience severe spinning and lightheadedness lasting several minutes and occurring off and on during the day.  It may occur with the headaches but also by itself.  Headaches are described as moderate pressure in a cap distribution, sometimes associated with nausea, photophobia and phonophobia, sometimes flickers or spots in her vision.  They last several hours, sometimes all day.  They may occur daily for a couple of weeks and then they may be episodic with one or two headache-free days in between.  On 07/09/2020, she had two syncopal spells that morning.  The first time, she walked into the kitchen and suddenly passed out.  She woke up and started walking out of the kitchen back to bed when she suddenly passed out again.  Witnessed by her daughter.  The events lasted about 30 seconds, no convulsions, tongue biting, palpitations or postictal confusion. However, she did have urinary incontinence.  Blood pressure was reportedly 92/52.  She is currently wearing a cardiac event monitor.  Carotid ultrasound on 07/30/2020 showed no hemodynamically significant ICA stenosis.  She went to the ED on 08/01/2020 for dizziness, left arm tingling and systolic BP 382N.  CT head personally  reviewed was negative for acute abnormalities.  Basic labs and troponin were negative.  EKG showed new RBBB but nothing acute.  She later had MRI and MRA of brain on 08/08/2020  personally reviewed showed mild bilateral mastoid effusions but otherwise unremarkable.  Since then, blood pressure has been elevated.  EEG on 09/03/2020 was normal.  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     She has remote history of migraines in 1999-2000.  They were severe headaches with diaphoresis, nausea, photophobia and phonophobia.  She was treated with propranolol for a time.  No history of seizures.     Past NSAIDS/analgesics:  none Past abortive triptans:  none Past abortive ergotamine:  none Past muscle relaxants:  none Past anti-emetic:  Zofran, Reglan, Phenergan Past antihypertensive medications:  Lasix Past antidepressant medications:  Amitriptyline, nortriptyline, citalopram Past anticonvulsant medications:  Gabapentin, Lyrica Past anti-CGRP:  none Past vitamins/Herbal/Supplements:  none Past antihistamines/decongestants:  none Other past therapies:  none  PAST MEDICAL HISTORY: Past Medical History:  Diagnosis Date   Allergic rhinitis    Anxiety    Asthma    hx bronchial asthma at times with upper resp infection   Depression    Fibromyalgia    GERD (gastroesophageal reflux disease)    Headache(784.0)    migraine and cluster headaches   Hyperlipemia    Hypertension    IBS (irritable bowel syndrome)    Menopause    Neuromuscular disorder ([place]) 2009   hx of fibromyalgia, polyarthralsia (surgery induced)   Osteoarthritis    Rheumatoid arthritis ([place])    Complicated by osteoarthritis as well.   Rheumatoid arthritis ([place])    Stress incontinence    at times   Tibial fracture 10/14/2016   evulsion periostial right   Type 2 diabetes mellitus with complication, without long-term current use of insulin ([facility])    diet controlled no meds -> however was diagnosed with a diabetic foot ulcer.    MEDICATIONS: Current Outpatient Medications on File Prior to Visit  Medication Sig Dispense Refill    acetaminophen (TYLENOL) 650 MG CR tablet Take 1,300 mg by mouth as needed.     albuterol (VENTOLIN HFA) 108 (90 Base) MCG/ACT inhaler Inhale 2 puffs by mouth into the lungs every 6 (six) hours as needed for wheezing or shortness of breath. 18 g 0   Ca Phosphate-Cholecalciferol (CALTRATE GUMMY BITES PO) Take 2 each 2 (two) times daily by mouth.      calcitonin, salmon, (MIACALCIN/FORTICAL) 200 UNIT/ACT nasal spray Instill 1 spray by nasal route every day, alternating nostrils. 3.7 mL 1   Cholecalciferol 125 MCG (5000 UT) TABS take 1 tablet by oral route every day     conjugated estrogens (PREMARIN) vaginal cream INSERT 1 GRAM of cream per vagina THREE TIMES A WEEK BY VAGINAL ROUTE. 30 g 5   COVID-19 mRNA bivalent vaccine, Pfizer, (PFIZER COVID-19 VAC BIVALENT) injection Inject into the muscle. 0.3 mL 0   cyclobenzaprine (FLEXERIL) 10 MG tablet Take 1 tablet (10 mg total) by mouth at bedtime for muscle spasms. 30 tablet 0   cyclobenzaprine (FLEXERIL) 10 MG tablet Take 1 tablet by mouth before bedtime for muscle spasms 30 tablet 2   diclofenac Sodium (VOLTAREN) 1 % GEL Apply 2 g topically See admin instructions.     DULoxetine (CYMBALTA) 30 MG capsule Take 1 capsule (30 mg total) by mouth 2 (two) times daily. 180 capsule 1   estradiol (DOTTI) 0.1 MG/24HR patch  Place 1 patch (0.1 mg total) onto the skin 2 (two) times a week. 24 patch 2   estradiol (DOTTI) 0.1 MG/24HR patch Apply 1 patch twice a week by transdermal route. 24 patch 4   fexofenadine (ALLEGRA ALLERGY) 180 MG tablet Take 1 tablet (180 mg total) by mouth daily. 30 tablet 0   fluticasone (FLOVENT HFA) 110 MCG/ACT inhaler Inhale 2 puffs by mouth into the lungs 2 (two) times daily. 12 g 12   furosemide (LASIX) 20 MG tablet TAKE 1 TABLET BY MOUTH EVERY MORNING 90 tablet 1   Galcanezumab-gnlm (EMGALITY) 120 MG/ML SOAJ INJECT 120 MG INTO THE SKIN EVERY 28 (TWENTY-EIGHT) DAYS. 1 mL 5   glucose blood test strip Test blood sugar once daily. Dx Code:  E11.9 100 each 12   hydrALAZINE (APRESOLINE) 25 MG tablet Take 1 tablet (25 mg total) by mouth 2 (two) times daily. Hold if feeling dizzy or SBP<110 mmHg).  Can use additional 25 mg as needed for SBP>150 mmHg 180 tablet 3   hydroxychloroquine (PLAQUENIL) 200 MG tablet Take 2 tablets by mouth once daily 60 tablet 3   Insulin Pen Needle (TECHLITE PEN NEEDLES) 32G X 6 MM MISC Use with Ozempic as directed 100 each 2   meclizine (ANTIVERT) 25 MG tablet Take 1 tablet (25 mg total) by mouth 3 (three) times daily as needed for dizziness. 20 tablet 0   methocarbamol (ROBAXIN) 500 MG tablet TAKE 1 TABLET BY MOUTH EVERY 6 HOURS AS NEEDED FOR MUSCLE SPASMS 120 tablet 0   methocarbamol (ROBAXIN) 500 MG tablet Take 1 tablet by mouth 3 times daily as needed for muscle spasms 90 tablet 2   Multiple Vitamins-Minerals (CENTRUM ADULTS) TABS take 2 tablets by oral route  every day with food     NONFORMULARY OR COMPOUNDED ITEM bp cuff  Dx hypertension 1 each 0   nystatin powder Apply 1 application topically 3 (three) times daily. 15 g 0   olmesartan (BENICAR) 20 MG tablet Take 1 tablet (20 mg total) by mouth daily with supper. 90 tablet 3   oxyCODONE-acetaminophen (PERCOCET) 7.5-325 MG tablet Take 1 tablet by mouth 3 times a day as needed for pain 90 tablet 0   pantoprazole (PROTONIX) 40 MG tablet Take 1 tablet (40 mg total) by mouth daily. 90 tablet 3   potassium chloride (KLOR-CON) 10 MEQ tablet TAKE 1 TABLET (10 MEQ TOTAL) BY MOUTH DAILY. 90 tablet 1   pregabalin (LYRICA) 150 MG capsule Take 1 capsule by mouth at bedtime 30 capsule 2   pregabalin (LYRICA) 75 MG capsule Take 1 capsule by mouth in the morning and in the afternoon 60 capsule 0   pregabalin (LYRICA) 75 MG capsule take 1 capsule by mouth in the morning and in the afternoon and 150mg  at bedtime 120 capsule 0   rizatriptan (MAXALT) 10 MG tablet Take 1 tablet (10 mg total) by mouth as needed for migraine. take 1 tablet as needed.  May repeat after 2 hours.   Maximum 2 tablets in 24 hours. 10 tablet 3   rosuvastatin (CRESTOR) 20 MG tablet TAKE 1 TABLET BY MOUTH ONCE DAILY 90 tablet 1   Semaglutide,0.25 or 0.5MG /DOS, (OZEMPIC, 0.25 OR 0.5 MG/DOSE,) 2 MG/1.5ML SOPN Inject 0.5 mg into the skin once a week. 1.5 mL 2   temazepam (RESTORIL) 30 MG capsule Take 1 capsule (30 mg total) by mouth at bedtime as needed for sleep. 90 capsule 0   thiamine (VITAMIN B-1) 100 MG tablet Take 100 mg daily  by mouth.     tocilizumab (ACTEMRA) 400 MG/20ML SOLN injection Inject 400 mg into the vein every 30 (thirty) days.     Ubrogepant (UBRELVY) 100 MG TABS Take by mouth.     No current facility-administered medications on file prior to visit.    ALLERGIES: Allergies  Allergen Reactions   Gabapentin Swelling    Pt states she has eye swelling    Isoniazid     Severe SOB   Morphine Hives and Nausea And Vomiting    Allergic to PCA pump only    Nitrofurantoin Shortness Of Breath, Rash and Other (See Comments)    REACTION: welts   Oseltamivir Phosphate Nausea And Vomiting    "I vomited within 30 minutes of taking it and was told I cannot ever take it again."   Oxycodone Hcl Hives and Itching   Tamiflu [Oseltamivir Phosphate] Nausea And Vomiting    "I vomited within 30 minutes of taking it and was told I cannot ever take it again."   Hctz [Hydrochlorothiazide] Rash   Macrolides And Ketolides Rash   Percocet [Oxycodone-Acetaminophen] Itching   Promethazine Hcl Other (See Comments)    IF GIVEN  IV-hallucinations     CAN TAKE PO PHENERGAN Other reaction(s): Unknown   Roxicet [Oxycodone-Acetaminophen] Itching   Sulfa Antibiotics Rash   Wound Dressing Adhesive Rash    Adhesive Tape    Fentanyl Hives and Rash    REACTION: welts   Oseltamivir Other (See Comments)    Other reaction(s): Unknown    Other Rash and Other (See Comments)    bandaids   Sulfamethoxazole-Trimethoprim Rash    FAMILY HISTORY: Family History  Problem Relation Age of Onset   Diabetes  Mother    Stroke Mother    Breast cancer Mother    Atrial fibrillation Mother    Hypertension Mother    Cancer Mother 73       breast   Diabetes Father    Stroke Father 56       2nd 53 month apart   Hypertension Father    Heart attack Father 16       stents   Dementia Father    AAA (abdominal aortic aneurysm) Father 66       repair   CAD Father 40   Thyroid disease Other    Heart disease Other    COPD Other       Objective:  Blood pressure (!) 167/78, pulse 93, height 5\' 11"  (1.803 m), weight 204 lb (92.5 kg), SpO2 94 %. General: No acute distress.  Patient appears well-groomed.   Head:  Normocephalic/atraumatic Eyes:  Fundi examined but not visualized Neck: supple, no paraspinal tenderness, full range of motion Heart:  Regular rate and rhythm Lungs:  Clear to auscultation bilaterally Back: No paraspinal tenderness Neurological Exam: alert and oriented to person, place, and time.  Speech fluent and not dysarthric, language intact.  CN II-XII intact. Bulk and tone normal, reduced right ankle dorsiflexion, otherwise muscle strength 5/5 throughout.  Sensation to light touch intact.  Deep tendon reflexes 2+ throughout, toes downgoing.  Finger to nose testing intact.  Gait steady, Romberg with sway   Metta Clines, DO  CC: Roma Schanz, DO

## 2021-09-09 ENCOUNTER — Other Ambulatory Visit: Payer: Self-pay

## 2021-09-09 ENCOUNTER — Other Ambulatory Visit (HOSPITAL_BASED_OUTPATIENT_CLINIC_OR_DEPARTMENT_OTHER): Payer: Self-pay

## 2021-09-09 ENCOUNTER — Encounter: Payer: Self-pay | Admitting: Neurology

## 2021-09-09 ENCOUNTER — Ambulatory Visit: Payer: 59 | Admitting: Neurology

## 2021-09-09 VITALS — BP 167/78 | HR 93 | Ht 71.0 in | Wt 204.0 lb

## 2021-09-09 DIAGNOSIS — I951 Orthostatic hypotension: Secondary | ICD-10-CM

## 2021-09-09 DIAGNOSIS — I10 Essential (primary) hypertension: Secondary | ICD-10-CM

## 2021-09-09 DIAGNOSIS — G43009 Migraine without aura, not intractable, without status migrainosus: Secondary | ICD-10-CM

## 2021-09-09 MED ORDER — EMGALITY 120 MG/ML ~~LOC~~ SOAJ
SUBCUTANEOUS | 11 refills | Status: DC
  Filled 2021-09-09 – 2021-09-29 (×2): qty 1, 28d supply, fill #0
  Filled 2021-10-22: qty 1, 28d supply, fill #1
  Filled 2021-11-30: qty 1, 28d supply, fill #2
  Filled 2021-12-23: qty 1, 28d supply, fill #3
  Filled 2022-01-20: qty 1, 28d supply, fill #4
  Filled 2022-02-19: qty 1, 28d supply, fill #5
  Filled 2022-04-12 – 2022-04-15 (×3): qty 1, 28d supply, fill #6
  Filled 2022-05-09: qty 1, 28d supply, fill #7
  Filled 2022-06-04: qty 1, 28d supply, fill #8
  Filled 2022-07-05: qty 1, 28d supply, fill #9
  Filled 2022-08-02: qty 1, 28d supply, fill #10
  Filled 2022-08-26: qty 1, 28d supply, fill #11

## 2021-09-09 NOTE — Patient Instructions (Signed)
Emgality ?[name] ?Follow up 9 months ?

## 2021-09-11 DIAGNOSIS — R531 Weakness: Secondary | ICD-10-CM | POA: Diagnosis not present

## 2021-09-11 DIAGNOSIS — Z7409 Other reduced mobility: Secondary | ICD-10-CM | POA: Diagnosis not present

## 2021-09-11 DIAGNOSIS — M4326 Fusion of spine, lumbar region: Secondary | ICD-10-CM | POA: Diagnosis not present

## 2021-09-17 DIAGNOSIS — M793 Panniculitis, unspecified: Secondary | ICD-10-CM | POA: Diagnosis not present

## 2021-09-17 DIAGNOSIS — Z9884 Bariatric surgery status: Secondary | ICD-10-CM | POA: Diagnosis not present

## 2021-09-18 ENCOUNTER — Other Ambulatory Visit (HOSPITAL_BASED_OUTPATIENT_CLINIC_OR_DEPARTMENT_OTHER): Payer: Self-pay

## 2021-09-18 DIAGNOSIS — M4326 Fusion of spine, lumbar region: Secondary | ICD-10-CM | POA: Diagnosis not present

## 2021-09-18 DIAGNOSIS — R531 Weakness: Secondary | ICD-10-CM | POA: Diagnosis not present

## 2021-09-18 DIAGNOSIS — Z7409 Other reduced mobility: Secondary | ICD-10-CM | POA: Diagnosis not present

## 2021-09-18 MED ORDER — OXYCODONE-ACETAMINOPHEN 5-325 MG PO TABS
1.0000 | ORAL_TABLET | Freq: Two times a day (BID) | ORAL | 0 refills | Status: DC | PRN
  Filled 2021-09-18: qty 60, 30d supply, fill #0

## 2021-09-19 ENCOUNTER — Other Ambulatory Visit (HOSPITAL_BASED_OUTPATIENT_CLINIC_OR_DEPARTMENT_OTHER): Payer: Self-pay

## 2021-09-21 ENCOUNTER — Other Ambulatory Visit (HOSPITAL_BASED_OUTPATIENT_CLINIC_OR_DEPARTMENT_OTHER): Payer: Self-pay

## 2021-09-22 ENCOUNTER — Other Ambulatory Visit (HOSPITAL_BASED_OUTPATIENT_CLINIC_OR_DEPARTMENT_OTHER): Payer: Self-pay

## 2021-09-24 DIAGNOSIS — M4326 Fusion of spine, lumbar region: Secondary | ICD-10-CM | POA: Diagnosis not present

## 2021-09-24 DIAGNOSIS — R531 Weakness: Secondary | ICD-10-CM | POA: Diagnosis not present

## 2021-09-24 DIAGNOSIS — Z7409 Other reduced mobility: Secondary | ICD-10-CM | POA: Diagnosis not present

## 2021-09-25 ENCOUNTER — Encounter: Payer: Self-pay | Admitting: Family Medicine

## 2021-09-28 ENCOUNTER — Other Ambulatory Visit (HOSPITAL_BASED_OUTPATIENT_CLINIC_OR_DEPARTMENT_OTHER): Payer: Self-pay

## 2021-09-28 MED ORDER — TEMAZEPAM 30 MG PO CAPS
30.0000 mg | ORAL_CAPSULE | Freq: Every evening | ORAL | 1 refills | Status: DC | PRN
  Filled 2021-09-28: qty 90, 90d supply, fill #0
  Filled 2021-12-23 – 2021-12-26 (×2): qty 90, 90d supply, fill #1

## 2021-09-28 MED ORDER — TEMAZEPAM 30 MG PO CAPS: 30.0000 mg | ORAL_CAPSULE | Freq: Every evening | ORAL | 1 refills | Status: DC | PRN

## 2021-09-28 NOTE — Addendum Note (Signed)
Addended by: Sanda Linger on: 09/28/2021 09:15 AM ? ? Modules accepted: Orders ? ?

## 2021-09-29 ENCOUNTER — Other Ambulatory Visit (HOSPITAL_BASED_OUTPATIENT_CLINIC_OR_DEPARTMENT_OTHER): Payer: Self-pay

## 2021-09-29 DIAGNOSIS — M0609 Rheumatoid arthritis without rheumatoid factor, multiple sites: Secondary | ICD-10-CM | POA: Diagnosis not present

## 2021-10-01 DIAGNOSIS — Z7409 Other reduced mobility: Secondary | ICD-10-CM | POA: Diagnosis not present

## 2021-10-01 DIAGNOSIS — M4326 Fusion of spine, lumbar region: Secondary | ICD-10-CM | POA: Diagnosis not present

## 2021-10-01 DIAGNOSIS — R531 Weakness: Secondary | ICD-10-CM | POA: Diagnosis not present

## 2021-10-05 ENCOUNTER — Other Ambulatory Visit: Payer: Self-pay | Admitting: Neurology

## 2021-10-06 DIAGNOSIS — Z0279 Encounter for issue of other medical certificate: Secondary | ICD-10-CM

## 2021-10-07 ENCOUNTER — Other Ambulatory Visit: Payer: Self-pay

## 2021-10-07 ENCOUNTER — Inpatient Hospital Stay: Payer: 59 | Attending: Family | Admitting: Family

## 2021-10-07 ENCOUNTER — Encounter: Payer: Self-pay | Admitting: Family

## 2021-10-07 ENCOUNTER — Inpatient Hospital Stay: Payer: 59

## 2021-10-07 VITALS — BP 137/68 | HR 71 | Temp 98.1°F | Resp 18 | Wt 210.8 lb

## 2021-10-07 DIAGNOSIS — D508 Other iron deficiency anemias: Secondary | ICD-10-CM | POA: Diagnosis not present

## 2021-10-07 DIAGNOSIS — Z9884 Bariatric surgery status: Secondary | ICD-10-CM | POA: Diagnosis not present

## 2021-10-07 DIAGNOSIS — D509 Iron deficiency anemia, unspecified: Secondary | ICD-10-CM

## 2021-10-07 LAB — CBC WITH DIFFERENTIAL (CANCER CENTER ONLY)
Abs Immature Granulocytes: 0.08 10*3/uL — ABNORMAL HIGH (ref 0.00–0.07)
Basophils Absolute: 0.1 10*3/uL (ref 0.0–0.1)
Basophils Relative: 1 %
Eosinophils Absolute: 0.2 10*3/uL (ref 0.0–0.5)
Eosinophils Relative: 3 %
HCT: 40.9 % (ref 36.0–46.0)
Hemoglobin: 13.1 g/dL (ref 12.0–15.0)
Immature Granulocytes: 1 %
Lymphocytes Relative: 29 %
Lymphs Abs: 1.8 10*3/uL (ref 0.7–4.0)
MCH: 27.9 pg (ref 26.0–34.0)
MCHC: 32 g/dL (ref 30.0–36.0)
MCV: 87.2 fL (ref 80.0–100.0)
Monocytes Absolute: 0.8 10*3/uL (ref 0.1–1.0)
Monocytes Relative: 12 %
Neutro Abs: 3.5 10*3/uL (ref 1.7–7.7)
Neutrophils Relative %: 54 %
Platelet Count: 160 10*3/uL (ref 150–400)
RBC: 4.69 MIL/uL (ref 3.87–5.11)
RDW: 17 % — ABNORMAL HIGH (ref 11.5–15.5)
WBC Count: 6.4 10*3/uL (ref 4.0–10.5)
nRBC: 0 % (ref 0.0–0.2)

## 2021-10-07 LAB — IRON AND IRON BINDING CAPACITY (CC-WL,HP ONLY)
Iron: 126 ug/dL (ref 28–170)
Saturation Ratios: 41 % — ABNORMAL HIGH (ref 10.4–31.8)
TIBC: 307 ug/dL (ref 250–450)
UIBC: 181 ug/dL (ref 148–442)

## 2021-10-07 LAB — RETICULOCYTES
Immature Retic Fract: 9.1 % (ref 2.3–15.9)
RBC.: 4.69 MIL/uL (ref 3.87–5.11)
Retic Count, Absolute: 63.8 10*3/uL (ref 19.0–186.0)
Retic Ct Pct: 1.4 % (ref 0.4–3.1)

## 2021-10-07 LAB — FERRITIN: Ferritin: 212 ng/mL (ref 11–307)

## 2021-10-07 NOTE — Progress Notes (Signed)
?Hematology and Oncology Follow Up Visit ? ?[name] ?[id number] ?09-10-1955 66 y.o. ?10/07/2021 ? ? 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  ?Current Therapy:        ?IV iron as indicated  ?  ?Interim History:  [name] is here today for follow-up. She is doing fairly well. She continues to recuperate from back surgery and is on PT. She is still pretty sore and wearing her back brace today. She states that she has not been taking her pain medication due to severe constipation.  ?She was able to take Miralax and Colace and have a BM.  ?No fever, chills, n/v, cough, rash, SOB, chest pain, palpitations, abdominal pain or changes in bladder habits.  ?She is careful when standing due to dizziness. No falls or syncope to report.  ?She has intermittent swelling in her hands.  ?The sciatica in her right leg from the knee down is unchanged from baseline.  ?She is eating well and doing her best to stay well hydrated. Her weight is stable at 210 lbs.  ? ?ECOG Performance Status: 1 - Symptomatic but completely ambulatory ? ?Medications:  ?Allergies as of 10/07/2021   ? ?   Reactions  ? Gabapentin Swelling  ? Pt states she has eye swelling   ? Isoniazid   ? Severe SOB  ? Morphine Hives, Nausea And Vomiting  ? Allergic to PCA pump only  ? Nitrofurantoin Shortness Of Breath, Rash, Other (See Comments)  ? REACTION: welts  ? Oseltamivir Phosphate Nausea And Vomiting  ? "I vomited within 30 minutes of taking it and was told I cannot ever take it again."  ? Tamiflu [oseltamivir Phosphate] Nausea And Vomiting  ? "I vomited within 30 minutes of taking it and was told I cannot ever take it again."  ? Hctz [hydrochlorothiazide] Rash  ? Macrolides And Ketolides Rash  ? Promethazine Hcl Other (See Comments)  ? IF GIVEN  IV-hallucinations     CAN TAKE PO PHENERGAN ?Other reaction(s): Unknown  ? Roxicet [oxycodone-acetaminophen] Itching  ? Sulfa Antibiotics Rash  ? Wound  Dressing Adhesive Rash  ? Adhesive Tape   ? Fentanyl Hives, Rash  ? REACTION: welts  ? Oseltamivir Other (See Comments)  ? Other reaction(s): Unknown  ? Other Rash, Other (See Comments)  ? bandaids  ? Sulfamethoxazole-trimethoprim Rash  ? ?  ? ?  ?Medication List  ?  ? ?  ? Accurate as of October 07, 2021  8:44 AM. If you have any questions, ask your nurse or doctor.  ?  ?  ? ?  ? ?acetaminophen 650 MG CR tablet ?Commonly known as: TYLENOL ?Take 1,300 mg by mouth as needed. ?  ?calcitonin (salmon) 200 UNIT/ACT nasal spray ?Commonly known as: MIACALCIN/FORTICAL ?Instill 1 spray by nasal route every day, alternating nostrils. ?  ?CALTRATE GUMMY BITES PO ?Take 2 each 2 (two) times daily by mouth. ?  ?Centrum Adults Tabs ?take 2 tablets by oral route  every day with food ?  ?Cholecalciferol 125 MCG (5000 UT) Tabs ?take 1 tablet by oral route every day ?  ?cyclobenzaprine 10 MG tablet ?Commonly known as: FLEXERIL ?Take 1 tablet (10 mg total) by mouth at bedtime for muscle spasms. ?  ?cyclobenzaprine 10 MG tablet ?Commonly known as: FLEXERIL ?Take 1 tablet by mouth before bedtime for muscle spasms ?  ?diclofenac Sodium 1 % Gel ?Commonly known as: VOLTAREN ?Apply 2 g topically See admin instructions. ?  ?Dotti  0.1 MG/24HR patch ?Generic drug: estradiol ?Place 1 patch (0.1 mg total) onto the skin 2 (two) times a week. ?  ?Dotti 0.1 MG/24HR patch ?Generic drug: estradiol ?Apply 1 patch twice a week by transdermal route. ?  ?DULoxetine 30 MG capsule ?Commonly known as: CYMBALTA ?Take 1 capsule (30 mg total) by mouth 2 (two) times daily. ?  ?Emgality 120 MG/ML Soaj ?Generic drug: Galcanezumab-gnlm ?INJECT 120 MG INTO THE SKIN EVERY 28 (TWENTY-EIGHT) DAYS. ?  ?furosemide 20 MG tablet ?Commonly known as: LASIX ?TAKE 1 TABLET BY MOUTH EVERY MORNING ?  ?glucose blood test strip ?Test blood sugar once daily. Dx Code: E11.9 ?  ?hydrALAZINE 25 MG tablet ?Commonly known as: APRESOLINE ?Take 1 tablet (25 mg total) by mouth 2 (two) times  daily. Hold if feeling dizzy or SBP<110 mmHg).  Can use additional 25 mg as needed for SBP>150 mmHg ?  ?hydroxychloroquine 200 MG tablet ?Commonly known as: PLAQUENIL ?Take 2 tablets by mouth once daily ?  ?meclizine 25 MG tablet ?Commonly known as: ANTIVERT ?Take 1 tablet (25 mg total) by mouth 3 (three) times daily as needed for dizziness. ?  ?methocarbamol 500 MG tablet ?Commonly known as: ROBAXIN ?TAKE 1 TABLET BY MOUTH EVERY 6 HOURS AS NEEDED FOR MUSCLE SPASMS ?  ?methocarbamol 500 MG tablet ?Commonly known as: ROBAXIN ?Take 1 tablet by mouth 3 times daily as needed for muscle spasms ?  ?NONFORMULARY OR COMPOUNDED ITEM ?bp cuff  Dx hypertension ?  ?nystatin powder ?Commonly known as: nystatin ?Apply 1 application topically 3 (three) times daily. ?  ?olmesartan 20 MG tablet ?Commonly known as: BENICAR ?Take 1 tablet (20 mg total) by mouth daily with supper. ?  ?oxyCODONE-acetaminophen 5-325 MG tablet ?Commonly known as: Percocet ?Take 1 tablet by mouth every 12 (twelve) hours as needed. ?  ?Ozempic (0.25 or 0.5 MG/DOSE) 2 MG/1.5ML Sopn ?Generic drug: Semaglutide(0.25 or 0.'5MG'$ /DOS) ?Inject 0.5 mg into the skin once a week. ?  ?pantoprazole 40 MG tablet ?Commonly known as: PROTONIX ?Take 1 tablet (40 mg total) by mouth daily. ?  ?Pfizer COVID-19 Vac Bivalent injection ?Generic drug: COVID-19 mRNA bivalent vaccine [employer]) ?Inject into the muscle. ?  ?potassium chloride 10 MEQ tablet ?Commonly known as: KLOR-CON M ?TAKE 1 TABLET (10 MEQ TOTAL) BY MOUTH DAILY. ?  ?pregabalin 150 MG capsule ?Commonly known as: Lyrica ?Take 1 capsule by mouth at bedtime ?  ?Premarin vaginal cream ?Generic drug: conjugated estrogens ?INSERT 1 GRAM of cream per vagina THREE TIMES A WEEK BY VAGINAL ROUTE. ?  ?rosuvastatin 20 MG tablet ?Commonly known as: CRESTOR ?TAKE 1 TABLET BY MOUTH ONCE DAILY ?  ?TechLite Pen Needles 32G X 6 MM Misc ?Generic drug: Insulin Pen Needle ?Use with Ozempic as directed ?  ?temazepam 30 MG capsule ?Commonly  known as: RESTORIL ?Take 1 capsule (30 mg total) by mouth at bedtime as needed for sleep. ?  ?thiamine 100 MG tablet ?Commonly known as: Vitamin B-1 ?Take 100 mg daily by mouth. ?  ?tocilizumab 400 MG/20ML Soln injection ?Commonly known as: ACTEMRA ?Inject 400 mg into the vein every 30 (thirty) days. ?  ?Ubrelvy 100 MG Tabs ?Generic drug: Ubrogepant ?TAKE ONE TABLET BY MOUTH DAILY AS NEEDED ( AS NEEDED FOR MIGRAINE. MAX 2 TABLETS IN 24 HOURS) ?  ? ?  ? ? ?Allergies:  ?Allergies  ?Allergen Reactions  ? Gabapentin Swelling  ?  Pt states she has eye swelling   ? Isoniazid   ?  Severe SOB  ? Morphine Hives and  Nausea And Vomiting  ?  Allergic to PCA pump only ?  ? Nitrofurantoin Shortness Of Breath, Rash and Other (See Comments)  ?  REACTION: welts  ? Oseltamivir Phosphate Nausea And Vomiting  ?  "I vomited within 30 minutes of taking it and was told I cannot ever take it again."  ? Tamiflu [Oseltamivir Phosphate] Nausea And Vomiting  ?  "I vomited within 30 minutes of taking it and was told I cannot ever take it again."  ? Hctz [Hydrochlorothiazide] Rash  ? Macrolides And Ketolides Rash  ? Promethazine Hcl Other (See Comments)  ?  IF GIVEN  IV-hallucinations     CAN TAKE PO PHENERGAN ?Other reaction(s): Unknown  ? Roxicet [Oxycodone-Acetaminophen] Itching  ? Sulfa Antibiotics Rash  ? Wound Dressing Adhesive Rash  ?  Adhesive Tape   ? Fentanyl Hives and Rash  ?  REACTION: welts  ? Oseltamivir Other (See Comments)  ?  Other reaction(s): Unknown ?  ? Other Rash and Other (See Comments)  ?  bandaids  ? Sulfamethoxazole-Trimethoprim Rash  ? ? ?Past Medical History, Surgical history, Social history, and Family History were reviewed and updated. ? ?Review of Systems: ?All other 10 point review of systems is negative.  ? ?Physical Exam: ? weight is 210 lb 12.8 oz (95.6 kg). Her oral temperature is 98.1 ?F (36.7 ?C). Her blood pressure is 137/68 and her pulse is 71. Her respiration is 18 and oxygen saturation is 99%.  ? ?Wt  Readings from Last 3 Encounters:  ?10/07/21 210 lb 12.8 oz (95.6 kg)  ?09/09/21 204 lb (92.5 kg)  ?07/21/21 211 lb (95.7 kg)  ? ? ?Ocular: Sclerae unicteric, pupils equal, round and reactive to light ?Ear-no

## 2021-10-08 DIAGNOSIS — R531 Weakness: Secondary | ICD-10-CM | POA: Diagnosis not present

## 2021-10-08 DIAGNOSIS — Z7409 Other reduced mobility: Secondary | ICD-10-CM | POA: Diagnosis not present

## 2021-10-08 DIAGNOSIS — M4326 Fusion of spine, lumbar region: Secondary | ICD-10-CM | POA: Diagnosis not present

## 2021-10-09 ENCOUNTER — Other Ambulatory Visit (HOSPITAL_BASED_OUTPATIENT_CLINIC_OR_DEPARTMENT_OTHER): Payer: Self-pay

## 2021-10-09 NOTE — Telephone Encounter (Signed)
error 

## 2021-10-13 ENCOUNTER — Other Ambulatory Visit (HOSPITAL_BASED_OUTPATIENT_CLINIC_OR_DEPARTMENT_OTHER): Payer: Self-pay

## 2021-10-13 DIAGNOSIS — M4326 Fusion of spine, lumbar region: Secondary | ICD-10-CM | POA: Diagnosis not present

## 2021-10-13 DIAGNOSIS — Z7409 Other reduced mobility: Secondary | ICD-10-CM | POA: Diagnosis not present

## 2021-10-13 DIAGNOSIS — R531 Weakness: Secondary | ICD-10-CM | POA: Diagnosis not present

## 2021-10-13 MED ORDER — METHOCARBAMOL 500 MG PO TABS
500.0000 mg | ORAL_TABLET | Freq: Four times a day (QID) | ORAL | 2 refills | Status: DC | PRN
  Filled 2021-10-13 – 2022-01-15 (×2): qty 120, 30d supply, fill #0
  Filled 2022-03-12: qty 43, 10d supply, fill #1
  Filled 2022-03-12: qty 77, 20d supply, fill #1

## 2021-10-13 MED ORDER — CYCLOBENZAPRINE HCL 10 MG PO TABS
10.0000 mg | ORAL_TABLET | Freq: Every day | ORAL | 2 refills | Status: DC
  Filled 2021-10-13 – 2021-12-23 (×2): qty 30, 30d supply, fill #0
  Filled 2022-01-17: qty 30, 30d supply, fill #1
  Filled 2022-02-19: qty 30, 30d supply, fill #2

## 2021-10-13 MED ORDER — METHOCARBAMOL 500 MG PO TABS
500.0000 mg | ORAL_TABLET | Freq: Three times a day (TID) | ORAL | 2 refills | Status: DC | PRN
  Filled 2021-10-13: qty 90, 30d supply, fill #0

## 2021-10-15 NOTE — Progress Notes (Signed)
Disability froms ready to be mailed to patient and faxed back to [facility] ?

## 2021-10-16 DIAGNOSIS — R531 Weakness: Secondary | ICD-10-CM | POA: Diagnosis not present

## 2021-10-16 DIAGNOSIS — M4326 Fusion of spine, lumbar region: Secondary | ICD-10-CM | POA: Diagnosis not present

## 2021-10-16 DIAGNOSIS — Z7409 Other reduced mobility: Secondary | ICD-10-CM | POA: Diagnosis not present

## 2021-10-20 ENCOUNTER — Encounter: Payer: Self-pay | Admitting: *Deleted

## 2021-10-21 ENCOUNTER — Other Ambulatory Visit (HOSPITAL_BASED_OUTPATIENT_CLINIC_OR_DEPARTMENT_OTHER): Payer: Self-pay

## 2021-10-22 ENCOUNTER — Other Ambulatory Visit (HOSPITAL_BASED_OUTPATIENT_CLINIC_OR_DEPARTMENT_OTHER): Payer: Self-pay

## 2021-10-22 ENCOUNTER — Encounter: Payer: Self-pay | Admitting: Family Medicine

## 2021-10-22 ENCOUNTER — Telehealth: Payer: Self-pay

## 2021-10-22 ENCOUNTER — Ambulatory Visit (INDEPENDENT_AMBULATORY_CARE_PROVIDER_SITE_OTHER): Payer: 59 | Admitting: Family Medicine

## 2021-10-22 VITALS — BP 128/70 | HR 71 | Temp 98.6°F | Resp 20 | Ht 71.0 in | Wt 206.6 lb

## 2021-10-22 DIAGNOSIS — E1169 Type 2 diabetes mellitus with other specified complication: Secondary | ICD-10-CM | POA: Diagnosis not present

## 2021-10-22 DIAGNOSIS — Z23 Encounter for immunization: Secondary | ICD-10-CM

## 2021-10-22 DIAGNOSIS — E118 Type 2 diabetes mellitus with unspecified complications: Secondary | ICD-10-CM | POA: Diagnosis not present

## 2021-10-22 DIAGNOSIS — I10 Essential (primary) hypertension: Secondary | ICD-10-CM

## 2021-10-22 DIAGNOSIS — F418 Other specified anxiety disorders: Secondary | ICD-10-CM | POA: Diagnosis not present

## 2021-10-22 DIAGNOSIS — E785 Hyperlipidemia, unspecified: Secondary | ICD-10-CM | POA: Diagnosis not present

## 2021-10-22 DIAGNOSIS — E1140 Type 2 diabetes mellitus with diabetic neuropathy, unspecified: Secondary | ICD-10-CM | POA: Diagnosis not present

## 2021-10-22 DIAGNOSIS — K219 Gastro-esophageal reflux disease without esophagitis: Secondary | ICD-10-CM | POA: Diagnosis not present

## 2021-10-22 DIAGNOSIS — Z0000 Encounter for general adult medical examination without abnormal findings: Secondary | ICD-10-CM

## 2021-10-22 DIAGNOSIS — M48061 Spinal stenosis, lumbar region without neurogenic claudication: Secondary | ICD-10-CM

## 2021-10-22 DIAGNOSIS — M47812 Spondylosis without myelopathy or radiculopathy, cervical region: Secondary | ICD-10-CM

## 2021-10-22 LAB — CBC WITH DIFFERENTIAL/PLATELET
Basophils Absolute: 0.1 10*3/uL (ref 0.0–0.1)
Basophils Relative: 1.1 % (ref 0.0–3.0)
Eosinophils Absolute: 0.1 10*3/uL (ref 0.0–0.7)
Eosinophils Relative: 1.9 % (ref 0.0–5.0)
HCT: 40.3 % (ref 36.0–46.0)
Hemoglobin: 13.5 g/dL (ref 12.0–15.0)
Lymphocytes Relative: 25.1 % (ref 12.0–46.0)
Lymphs Abs: 1.5 10*3/uL (ref 0.7–4.0)
MCHC: 33.6 g/dL (ref 30.0–36.0)
MCV: 84.5 fl (ref 78.0–100.0)
Monocytes Absolute: 0.7 10*3/uL (ref 0.1–1.0)
Monocytes Relative: 11.5 % (ref 3.0–12.0)
Neutro Abs: 3.7 10*3/uL (ref 1.4–7.7)
Neutrophils Relative %: 60.4 % (ref 43.0–77.0)
Platelets: 140 10*3/uL — ABNORMAL LOW (ref 150.0–400.0)
RBC: 4.77 Mil/uL (ref 3.87–5.11)
RDW: 17.8 % — ABNORMAL HIGH (ref 11.5–15.5)
WBC: 6.2 10*3/uL (ref 4.0–10.5)

## 2021-10-22 LAB — COMPREHENSIVE METABOLIC PANEL
ALT: 30 U/L (ref 0–35)
AST: 29 U/L (ref 0–37)
Albumin: 4.4 g/dL (ref 3.5–5.2)
Alkaline Phosphatase: 82 U/L (ref 39–117)
BUN: 11 mg/dL (ref 6–23)
CO2: 33 mEq/L — ABNORMAL HIGH (ref 19–32)
Calcium: 9.6 mg/dL (ref 8.4–10.5)
Chloride: 101 mEq/L (ref 96–112)
Creatinine, Ser: 0.82 mg/dL (ref 0.40–1.20)
GFR: 75.39 mL/min (ref >60.00)
Glucose, Bld: 95 mg/dL (ref 70–99)
Potassium: 4.3 mEq/L (ref 3.5–5.1)
Sodium: 139 mEq/L (ref 135–145)
Total Bilirubin: 1 mg/dL (ref 0.2–1.2)
Total Protein: 6.6 g/dL (ref 6.0–8.3)

## 2021-10-22 LAB — LIPID PANEL
Cholesterol: 109 mg/dL (ref 0–200)
HDL: 62.8 mg/dL (ref >39.00)
LDL Cholesterol: 35 mg/dL (ref 0–99)
NonHDL: 46.6
Total CHOL/HDL Ratio: 2
Triglycerides: 56 mg/dL (ref 0.0–149.0)
VLDL: 11.2 mg/dL (ref 0.0–40.0)

## 2021-10-22 LAB — MICROALBUMIN / CREATININE URINE RATIO
Creatinine,U: 73 mg/dL
Microalb Creat Ratio: 1 mg/g (ref 0.0–30.0)
Microalb, Ur: <0.7 mg/dL (ref 0.0–1.9)

## 2021-10-22 LAB — TSH: TSH: 1.67 u[IU]/mL (ref 0.35–5.50)

## 2021-10-22 LAB — HEMOGLOBIN A1C: Hgb A1c MFr Bld: 6.1 % (ref 4.6–6.5)

## 2021-10-22 MED ORDER — SEMAGLUTIDE (1 MG/DOSE) 4 MG/3ML ~~LOC~~ SOPN
1.0000 mg | PEN_INJECTOR | SUBCUTANEOUS | 3 refills | Status: DC
  Filled 2021-10-22: qty 3, 28d supply, fill #0

## 2021-10-22 MED ORDER — ROSUVASTATIN CALCIUM 20 MG PO TABS
ORAL_TABLET | Freq: Every day | ORAL | 1 refills | Status: DC
  Filled 2021-10-22: qty 90, fill #0
  Filled 2021-11-27: qty 90, 90d supply, fill #0
  Filled 2022-02-23: qty 90, 90d supply, fill #1

## 2021-10-22 MED ORDER — PANTOPRAZOLE SODIUM 40 MG PO TBEC
40.0000 mg | DELAYED_RELEASE_TABLET | Freq: Every day | ORAL | 3 refills | Status: DC
  Filled 2021-10-22 – 2022-01-15 (×2): qty 90, 90d supply, fill #0
  Filled 2022-04-12: qty 90, 90d supply, fill #1

## 2021-10-22 MED ORDER — DULOXETINE HCL 30 MG PO CPEP
30.0000 mg | ORAL_CAPSULE | Freq: Two times a day (BID) | ORAL | 1 refills | Status: DC
  Filled 2021-10-22 – 2022-01-15 (×3): qty 180, 90d supply, fill #0
  Filled 2022-03-12 – 2022-04-12 (×2): qty 180, 90d supply, fill #1

## 2021-10-22 NOTE — Assessment & Plan Note (Signed)
Encourage heart healthy diet such as MIND or DASH diet, increase exercise, avoid trans fats, simple carbohydrates and processed foods, consider a krill or fish or flaxseed oil cap daily.  °

## 2021-10-22 NOTE — Assessment & Plan Note (Signed)
Well controlled, no changes to meds. Encouraged heart healthy diet such as the DASH diet and exercise as tolerated.  °

## 2021-10-22 NOTE — Assessment & Plan Note (Signed)
Stable , chronic ? ?

## 2021-10-22 NOTE — Progress Notes (Addendum)
? ?Subjective:  ? ?By signing my name below, I, [name], attest that this documentation has been prepared under the direction and in the presence of Amy Held, DO. 10/22/2021 ?   ? ? Patient ID: [name], female    DOB: Jan 17, 1956, 66 y.o.   MRN: [id number] ? ?Chief Complaint  ?Patient presents with  ? Annual Exam  ?  Pt states fasting   ? ? ?HPI ?Patient is in today for a comprehensive physical exam.  ? ?She is requesting for forms be filled for disability at work.  ?Her blood sugars are measuring normal at home. She reports her blood sugar measured 87 this morning. She continues taking 0.5 mg ozempic and reports doing well while taking it. She is interested in increasing her ozempic dosage. She reports a couple of weeks ago her blood sugar measured 41 but she notes not eating lunch. She found after eating some sugary snacks her blood sugar returned to normal levels.  ?Lab Results  ?Component Value Date  ? HGBA1C 6.1 10/22/2021  ? ?She denies having any fever, new muscle pain, new joint pain, new moles, congestion, sinus pain, sore throat, chest pain, palpations, cough, SOB, wheezing, n/v/d, constipation, blood in stool, dysuria, frequency, hematuria, or headaches at this time.  ?She is interested in receiving a pneumonia vaccine. She is UTD on Covid-19 vaccines. She is UTD on flu vaccines this year. She is UTD on shingrix vaccines.  ? ? ?Past Medical History:  ?Diagnosis Date  ? Allergic rhinitis   ? Anxiety   ? Asthma   ? hx bronchial asthma at times with upper resp infection  ? Depression   ? Fibromyalgia   ? GERD (gastroesophageal reflux disease)   ? Headache(784.0)   ? migraine and cluster headaches  ? Hyperlipemia   ? Hypertension   ? IBS (irritable bowel syndrome)   ? Menopause   ? Neuromuscular disorder ([place]) 2009  ? hx of fibromyalgia, polyarthralsia (surgery induced)  ? Osteoarthritis   ? Rheumatoid arthritis ([place])   ? Complicated by osteoarthritis as well.  ? Rheumatoid arthritis  ([place])   ? Stress incontinence   ? at times  ? Tibial fracture 10/14/2016  ? evulsion periostial right  ? Type 2 diabetes mellitus with complication, without long-term current use of insulin ([place])   ? diet controlled no meds -> however was diagnosed with a diabetic foot ulcer.  ? ? ?Past Surgical History:  ?Procedure Laterality Date  ? ABDOMINAL HYSTERECTOMY  12/1999  ? complete  ? CARPAL TUNNEL RELEASE Right 12/27/2017  ? Procedure: CARPAL TUNNEL RELEASE;  Surgeon: Roseanne Kaufman, MD;  Location: [facility];  Service: Orthopedics;  Laterality: Right;  ? CERVICAL CONIZATION W/BX  june 1990  ? dysplasia of cervix/used 5Fu cream for 3 months  ? CERVICAL LAMINECTOMY  2005 & 2009  ? X 2   NO ROM PROBLEMS  ? [place]  ? X 2  ? CHOLECYSTECTOMY  1986  ? DILATION AND CURETTAGE OF UTERUS   [phone]  ? missed abortion  ? ESOPHAGOGASTRODUODENOSCOPY  06/29/2011  ? Procedure: ESOPHAGOGASTRODUODENOSCOPY (EGD);  Surgeon: Shann Medal, MD;  Location: [facility];  Service: General;  Laterality: N/A;  ? FOOT SURGERY  2005  ? RT HEEL  ? GASTRIC BANDING PORT REVISION  09/24/2011  ? Procedure: GASTRIC BANDING PORT REVISION;  Surgeon: Pedro Earls, MD;  Location: [facility];  Service: General;  Laterality: N/A;  ? GASTRIC ROUX-EN-Y N/A  06/06/2017  ? Procedure: 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;  Surgeon: Johnathan Hausen, MD;  Location: [facility];  Service: General;  Laterality: N/A;  ? HYSTEROSCOPY  1999  ? LAPAROSCOPIC GASTRIC BANDING  12/30/09  ? LAPAROSCOPIC GASTRIC SLEEVE RESECTION N/A 07/02/2013  ? Procedure: LAPAROSCOPIC GASTRIC SLEEVE RESECTION upper endoscopy;  Surgeon: Pedro Earls, MD;  Location: [facility];  Service: General;  Laterality: N/A;  ? LAPAROSCOPIC REPAIR AND REMOVAL OF GASTRIC BAND N/A 07/02/2013  ? Procedure: LAPAROSCOPIC REMOVAL OF GASTRIC BAND;  Surgeon: Pedro Earls, MD;  Location: [facility];  Service: General;  Laterality: N/A;  ? LAPAROSCOPIC  REVISION OF GASTRIC BAND  07/03/2012  ? Procedure: LAPAROSCOPIC REVISION OF GASTRIC BAND;  Surgeon: Pedro Earls, MD;  Location: [facility];  Service: General;  Laterality: N/A;  removal of old lap. band port, replaced with AP standard band  ? LAPAROSCOPY  09/24/2011  ? Procedure: LAPAROSCOPY DIAGNOSTIC;  Surgeon: Pedro Earls, MD;  Location: [facility];  Service: General;  Laterality: N/A;  ? LAPAROSCOPY  07/03/2012  ? Procedure: LAPAROSCOPY DIAGNOSTIC;  Surgeon: Pedro Earls, MD;  Location: [facility];  Service: General;  Laterality: N/A;  Exploratory Laparoscopy   ? MESH APPLIED TO LAP PORT  07/03/2012  ? Procedure: MESH APPLIED TO LAP PORT;  Surgeon: Pedro Earls, MD;  Location: [facility];  Service: General;  Laterality: N/A;  ? [place]  ? X 2  ? right knee  1981  ? ARTHROSCOPY AND ARTHROTOMY  ? right knee arthroscopy and arthrotomy  12-1979  ? TONSILLECTOMY  1971  ? TUBAL LIGATION  1993  ? WITH C -SECTION  ? ? ?Family History  ?Problem Relation Age of [place]  ? Diabetes Mother   ? Stroke Mother   ? Breast cancer Mother   ? Atrial fibrillation Mother   ? Hypertension Mother   ? Cancer Mother 52  ?     breast  ? Diabetes Father   ? Stroke Father 78  ?     2nd 6 month apart  ? Hypertension Father   ? Heart attack Father 41  ?     stents  ? Dementia Father   ? AAA (abdominal aortic aneurysm) Father 79  ?     repair  ? CAD Father 72  ? Thyroid disease Other   ? Heart disease Other   ? COPD Other   ? ? ?Social History  ? ?Socioeconomic History  ? Marital status: Married  ?  Spouse name: Zionna Homewood  ? Number of children: 2  ? Years of education: Not on file  ? Highest education level: Not on file  ?Occupational History  ? Occupation: Therapist, sports  ?  Employer: [employer]  ?Tobacco Use  ? Smoking status: Never  ? Smokeless tobacco: Never  ?Vaping Use  ? Vaping Use: Never used  ?Substance and Sexual Activity  ? Alcohol use: Not Currently  ? Drug use: Never  ? Sexual activity: Yes  ?   Birth control/protection: None  ?Other Topics Concern  ? Not on file  ?Social History Narrative  ? Right handed  ? Two story home  ? Drinks caffeine occasionally  ? ?Social Determinants of Health  ? ?Financial Resource Strain: Not on file  ?Food Insecurity: Not on file  ?Transportation Needs: Not on file  ?Physical Activity: Not on file  ?Stress: Not on file  ?Social Connections: Not  on file  ?Intimate Partner Violence: Not on file  ? ? ?Outpatient Medications Prior to Visit  ?Medication Sig Dispense Refill  ? acetaminophen (TYLENOL) 650 MG CR tablet Take 1,300 mg by mouth as needed.    ? Ca Phosphate-Cholecalciferol (CALTRATE GUMMY BITES PO) Take 2 each 2 (two) times daily by mouth.     ? calcitonin, salmon, (MIACALCIN/FORTICAL) 200 UNIT/ACT nasal spray Instill 1 spray by nasal route every day, alternating nostrils. 3.7 mL 1  ? Cholecalciferol 125 MCG (5000 UT) TABS take 1 tablet by oral route every day    ? conjugated estrogens (PREMARIN) vaginal cream INSERT 1 GRAM of cream per vagina THREE TIMES A WEEK BY VAGINAL ROUTE. 30 g 5  ? cyclobenzaprine (FLEXERIL) 10 MG tablet Take 1 tablet (10 mg total) by mouth before bedtime for muscle spasms . 30 tablet 2  ? diclofenac Sodium (VOLTAREN) 1 % GEL Apply 2 g topically See admin instructions.    ? estradiol (DOTTI) 0.1 MG/24HR patch Apply 1 patch twice a week by transdermal route. 24 patch 4  ? furosemide (LASIX) 20 MG tablet TAKE 1 TABLET BY MOUTH EVERY MORNING 90 tablet 1  ? Galcanezumab-gnlm (EMGALITY) 120 MG/ML SOAJ INJECT 120 MG INTO THE SKIN EVERY 28 (TWENTY-EIGHT) DAYS. 1 mL 11  ? glucose blood test strip Test blood sugar once daily. Dx Code: E11.9 100 each 12  ? hydrALAZINE (APRESOLINE) 25 MG tablet Take 1 tablet (25 mg total) by mouth 2 (two) times daily. Hold if feeling dizzy or SBP<110 mmHg).  Can use additional 25 mg as needed for SBP>150 mmHg 180 tablet 3  ? hydroxychloroquine (PLAQUENIL) 200 MG tablet Take 2 tablets by mouth once daily 60 tablet 3  ?  Insulin Pen Needle (TECHLITE PEN NEEDLES) 32G X 6 MM MISC Use with Ozempic as directed 100 each 2  ? meclizine (ANTIVERT) 25 MG tablet Take 1 tablet (25 mg total) by mouth 3 (three) times daily as needed for [facility]

## 2021-10-22 NOTE — Assessment & Plan Note (Signed)
ghm utd Check labs  See avs  

## 2021-10-22 NOTE — Telephone Encounter (Signed)
PA initiated via Covermymeds; KEY: [id number]. Awaiting determination.  ?

## 2021-10-22 NOTE — Assessment & Plan Note (Signed)
Per neuro/ neuro surgeon  ? ?

## 2021-10-22 NOTE — Patient Instructions (Signed)

## 2021-10-22 NOTE — Assessment & Plan Note (Signed)
Check labs 

## 2021-10-22 NOTE — Assessment & Plan Note (Signed)
Stable

## 2021-10-23 ENCOUNTER — Other Ambulatory Visit (HOSPITAL_BASED_OUTPATIENT_CLINIC_OR_DEPARTMENT_OTHER): Payer: Self-pay

## 2021-10-26 ENCOUNTER — Other Ambulatory Visit (HOSPITAL_BASED_OUTPATIENT_CLINIC_OR_DEPARTMENT_OTHER): Payer: Self-pay

## 2021-10-26 NOTE — Telephone Encounter (Signed)
PA approved. Effective 10/24/21 to 10/23/22. ?

## 2021-10-27 ENCOUNTER — Other Ambulatory Visit: Payer: Self-pay | Admitting: Pharmacist

## 2021-10-27 DIAGNOSIS — M0609 Rheumatoid arthritis without rheumatoid factor, multiple sites: Secondary | ICD-10-CM | POA: Diagnosis not present

## 2021-10-28 ENCOUNTER — Other Ambulatory Visit (HOSPITAL_BASED_OUTPATIENT_CLINIC_OR_DEPARTMENT_OTHER): Payer: Self-pay

## 2021-10-29 DIAGNOSIS — R531 Weakness: Secondary | ICD-10-CM | POA: Diagnosis not present

## 2021-10-29 DIAGNOSIS — M4326 Fusion of spine, lumbar region: Secondary | ICD-10-CM | POA: Diagnosis not present

## 2021-10-29 DIAGNOSIS — Z7409 Other reduced mobility: Secondary | ICD-10-CM | POA: Diagnosis not present

## 2021-11-02 ENCOUNTER — Other Ambulatory Visit: Payer: Self-pay | Admitting: Neurology

## 2021-11-03 ENCOUNTER — Other Ambulatory Visit (HOSPITAL_BASED_OUTPATIENT_CLINIC_OR_DEPARTMENT_OTHER): Payer: Self-pay

## 2021-11-03 ENCOUNTER — Encounter: Payer: Self-pay | Admitting: Family Medicine

## 2021-11-04 ENCOUNTER — Encounter: Payer: Self-pay | Admitting: Podiatry

## 2021-11-04 ENCOUNTER — Ambulatory Visit: Payer: 59 | Admitting: Podiatry

## 2021-11-04 DIAGNOSIS — L600 Ingrowing nail: Secondary | ICD-10-CM | POA: Diagnosis not present

## 2021-11-04 DIAGNOSIS — E1142 Type 2 diabetes mellitus with diabetic polyneuropathy: Secondary | ICD-10-CM | POA: Diagnosis not present

## 2021-11-04 DIAGNOSIS — L84 Corns and callosities: Secondary | ICD-10-CM

## 2021-11-06 ENCOUNTER — Other Ambulatory Visit (HOSPITAL_BASED_OUTPATIENT_CLINIC_OR_DEPARTMENT_OTHER): Payer: Self-pay

## 2021-11-12 NOTE — Progress Notes (Signed)
?Subjective:  ?Patient ID: [name], female    DOB: 1956/04/12,  MRN: [id number] ? ?[name] presents to clinic today for at risk foot care with history of diabetic neuropathy and corn(s) right foot which interfere(s) with ambulation. Aggravating factors include wearing enclosed shoe gear. Pain is relieved with periodic professional debridement. ? ?Patient states blood glucose was 100 mg/dl today.   ? ?Last known HgA1c was 6.1%. ? ?New problem(s): None.  ? ?PCP is Carollee Herter, Alferd Apa, DO , and last visit was October 22, 2021. ? ?Allergies  ?Allergen Reactions  ? Gabapentin Swelling  ?  Pt states she has eye swelling   ? Isoniazid   ?  Severe SOB  ? Morphine Hives and Nausea And Vomiting  ?  Allergic to PCA pump only ?  ? Nitrofurantoin Shortness Of Breath, Rash and Other (See Comments)  ?  REACTION: welts  ? Oseltamivir Phosphate Nausea And Vomiting  ?  "I vomited within 30 minutes of taking it and was told I cannot ever take it again."  ? Tamiflu [Oseltamivir Phosphate] Nausea And Vomiting  ?  "I vomited within 30 minutes of taking it and was told I cannot ever take it again."  ? Hctz [Hydrochlorothiazide] Rash  ? Macrolides And Ketolides Rash  ? Promethazine Hcl Other (See Comments)  ?  IF GIVEN  IV-hallucinations     CAN TAKE PO PHENERGAN ?Other reaction(s): Unknown  ? Roxicet [Oxycodone-Acetaminophen] Itching  ? Sulfa Antibiotics Rash  ? Wound Dressing Adhesive Rash  ?  Adhesive Tape   ? Fentanyl Hives and Rash  ?  REACTION: welts  ? Oseltamivir Other (See Comments)  ?  Other reaction(s): Unknown ?  ? Other Rash and Other (See Comments)  ?  bandaids  ? Sulfamethoxazole-Trimethoprim Rash  ? ? ?Review of Systems: Negative except as noted in the HPI. ? ?Objective: ? ?There were no vitals filed for this visit. ? ?[name] is a pleasant 66 y.o. female in NAD. AAO X 3. ? ?Vascular Examination: ?CFT <3 seconds b/l LE. Palpable DP/PT pulses b/l LE. Digital hair absent b/l. Skin temperature  gradient WNL b/l. No pain with calf compression b/l. No edema noted b/l. No cyanosis or clubbing noted b/l LE. ? ?Dermatological Examination: ?Pedal integument with normal turgor, texture and tone BLE. No open wounds b/l LE. No interdigital macerations noted b/l LE. Toenails recently debrided. Hyperkeratotic lesion(s) R 3rd toe.  No erythema, no edema, no drainage, no fluctuance. Incurvated nailplate L 3rd toe with tenderness to palpation. No erythema, no edema, no drainage noted. No fluctuance.  ? ?Musculoskeletal Examination: ?Inversion deformity of right ankle. Clawtoe deformity R 3rd toe, R 4th toe, and R 5th toe. ? ?Neurological Examination: ?Pt has subjective symptoms of neuropathy. Protective sensation intact 5/5 intact bilaterally with 10g monofilament b/l. ? ?  Latest Ref Rng & Units 10/22/2021  ?  9:57 AM 04/21/2021  ? 10:41 AM  ?Hemoglobin A1C  ?Hemoglobin-A1c 4.6 - 6.5 % 6.1   7.3    ? ?Assessment/Plan: ?1. Corns   ?2. Ingrown toenail without infection   ?3. Diabetic peripheral neuropathy associated with type 2 diabetes mellitus ([place])   ?  ?-Patient was evaluated and treated. All patient's and/or POA's questions/concerns answered on today's visit. ?-She continues to have marked improvement with Aquaphor Ointment applied to feet daily. ?-Offending nail border debrided and curretaged L 3rd toe utilizing sterile nail nipper and currette. Border(s) cleansed with alcohol and TAO applied. Patient instructed to  apply triple antibiotic ointment  to L 3rd toe once daily for 7 days. ?-Corn(s) R 2nd toe pared utilizing sterile scalpel blade without complication or incident. Total number debrided=1. ?-Patient/POA to call should there be question/concern in the interim.  ? ?Return in about 3 months (around 02/03/2022). ? ?Marzetta Board, DPM  ?

## 2021-11-20 ENCOUNTER — Encounter: Payer: Self-pay | Admitting: Family Medicine

## 2021-11-20 ENCOUNTER — Other Ambulatory Visit (HOSPITAL_BASED_OUTPATIENT_CLINIC_OR_DEPARTMENT_OTHER): Payer: Self-pay

## 2021-11-20 MED ORDER — SEMAGLUTIDE (2 MG/DOSE) 8 MG/3ML ~~LOC~~ SOPN
2.0000 mg | PEN_INJECTOR | SUBCUTANEOUS | 3 refills | Status: DC
  Filled 2021-11-20: qty 3, 28d supply, fill #0
  Filled 2021-12-21: qty 3, 28d supply, fill #1
  Filled 2022-01-17: qty 3, 28d supply, fill #2
  Filled 2022-02-02 – 2022-02-08 (×2): qty 3, 28d supply, fill #3

## 2021-11-23 ENCOUNTER — Other Ambulatory Visit (HOSPITAL_BASED_OUTPATIENT_CLINIC_OR_DEPARTMENT_OTHER): Payer: Self-pay

## 2021-11-25 ENCOUNTER — Other Ambulatory Visit (HOSPITAL_BASED_OUTPATIENT_CLINIC_OR_DEPARTMENT_OTHER): Payer: Self-pay

## 2021-11-25 DIAGNOSIS — M79643 Pain in unspecified hand: Secondary | ICD-10-CM | POA: Diagnosis not present

## 2021-11-25 DIAGNOSIS — M0609 Rheumatoid arthritis without rheumatoid factor, multiple sites: Secondary | ICD-10-CM | POA: Diagnosis not present

## 2021-11-25 DIAGNOSIS — G8929 Other chronic pain: Secondary | ICD-10-CM | POA: Diagnosis not present

## 2021-11-25 DIAGNOSIS — M2550 Pain in unspecified joint: Secondary | ICD-10-CM | POA: Diagnosis not present

## 2021-11-25 DIAGNOSIS — M1990 Unspecified osteoarthritis, unspecified site: Secondary | ICD-10-CM | POA: Diagnosis not present

## 2021-11-25 DIAGNOSIS — Z79899 Other long term (current) drug therapy: Secondary | ICD-10-CM | POA: Diagnosis not present

## 2021-11-25 DIAGNOSIS — M797 Fibromyalgia: Secondary | ICD-10-CM | POA: Diagnosis not present

## 2021-11-25 DIAGNOSIS — M549 Dorsalgia, unspecified: Secondary | ICD-10-CM | POA: Diagnosis not present

## 2021-11-25 MED ORDER — HYDROXYCHLOROQUINE SULFATE 200 MG PO TABS
ORAL_TABLET | ORAL | 0 refills | Status: DC
  Filled 2021-11-25 – 2021-12-23 (×2): qty 180, 90d supply, fill #0

## 2021-11-26 DIAGNOSIS — M0609 Rheumatoid arthritis without rheumatoid factor, multiple sites: Secondary | ICD-10-CM | POA: Diagnosis not present

## 2021-11-27 ENCOUNTER — Other Ambulatory Visit (HOSPITAL_BASED_OUTPATIENT_CLINIC_OR_DEPARTMENT_OTHER): Payer: Self-pay

## 2021-11-30 ENCOUNTER — Other Ambulatory Visit (HOSPITAL_BASED_OUTPATIENT_CLINIC_OR_DEPARTMENT_OTHER): Payer: Self-pay

## 2021-12-03 ENCOUNTER — Other Ambulatory Visit (HOSPITAL_BASED_OUTPATIENT_CLINIC_OR_DEPARTMENT_OTHER): Payer: Self-pay

## 2021-12-03 DIAGNOSIS — G894 Chronic pain syndrome: Secondary | ICD-10-CM | POA: Diagnosis not present

## 2021-12-03 DIAGNOSIS — M797 Fibromyalgia: Secondary | ICD-10-CM | POA: Diagnosis not present

## 2021-12-03 DIAGNOSIS — Z981 Arthrodesis status: Secondary | ICD-10-CM | POA: Diagnosis not present

## 2021-12-03 DIAGNOSIS — M549 Dorsalgia, unspecified: Secondary | ICD-10-CM | POA: Diagnosis not present

## 2021-12-03 DIAGNOSIS — M4316 Spondylolisthesis, lumbar region: Secondary | ICD-10-CM | POA: Diagnosis not present

## 2021-12-03 DIAGNOSIS — M4326 Fusion of spine, lumbar region: Secondary | ICD-10-CM | POA: Diagnosis not present

## 2021-12-03 MED ORDER — PREGABALIN 150 MG PO CAPS
ORAL_CAPSULE | ORAL | 1 refills | Status: DC
  Filled 2021-12-03 – 2021-12-17 (×2): qty 90, 30d supply, fill #0
  Filled 2022-01-15: qty 90, 30d supply, fill #1

## 2021-12-03 MED ORDER — PREGABALIN 150 MG PO CAPS
150.0000 mg | ORAL_CAPSULE | Freq: Every day | ORAL | 1 refills | Status: DC
  Filled 2021-12-03: qty 90, 90d supply, fill #0
  Filled 2022-03-13 – 2022-03-31 (×3): qty 90, 90d supply, fill #1

## 2021-12-17 ENCOUNTER — Other Ambulatory Visit (HOSPITAL_BASED_OUTPATIENT_CLINIC_OR_DEPARTMENT_OTHER): Payer: Self-pay

## 2021-12-17 ENCOUNTER — Encounter: Payer: Self-pay | Admitting: Family

## 2021-12-21 ENCOUNTER — Other Ambulatory Visit: Payer: Self-pay

## 2021-12-21 ENCOUNTER — Other Ambulatory Visit (HOSPITAL_BASED_OUTPATIENT_CLINIC_OR_DEPARTMENT_OTHER): Payer: Self-pay

## 2021-12-21 ENCOUNTER — Encounter: Payer: Self-pay | Admitting: Neurology

## 2021-12-21 DIAGNOSIS — G43019 Migraine without aura, intractable, without status migrainosus: Secondary | ICD-10-CM

## 2021-12-21 DIAGNOSIS — G43009 Migraine without aura, not intractable, without status migrainosus: Secondary | ICD-10-CM

## 2021-12-21 MED ORDER — UBRELVY 100 MG PO TABS: ORAL_TABLET | ORAL | 1 refills | Status: DC

## 2021-12-23 ENCOUNTER — Other Ambulatory Visit: Payer: Self-pay | Admitting: Cardiology

## 2021-12-23 ENCOUNTER — Other Ambulatory Visit (HOSPITAL_BASED_OUTPATIENT_CLINIC_OR_DEPARTMENT_OTHER): Payer: Self-pay

## 2021-12-23 MED ORDER — OLMESARTAN MEDOXOMIL 20 MG PO TABS
20.0000 mg | ORAL_TABLET | Freq: Every day | ORAL | 1 refills | Status: DC
  Filled 2021-12-23: qty 90, 90d supply, fill #0
  Filled 2022-03-29: qty 90, 90d supply, fill #1

## 2021-12-28 ENCOUNTER — Other Ambulatory Visit (HOSPITAL_BASED_OUTPATIENT_CLINIC_OR_DEPARTMENT_OTHER): Payer: Self-pay

## 2021-12-30 DIAGNOSIS — M0609 Rheumatoid arthritis without rheumatoid factor, multiple sites: Secondary | ICD-10-CM | POA: Diagnosis not present

## 2022-01-01 DIAGNOSIS — L570 Actinic keratosis: Secondary | ICD-10-CM | POA: Diagnosis not present

## 2022-01-04 DIAGNOSIS — M65332 Trigger finger, left middle finger: Secondary | ICD-10-CM | POA: Diagnosis not present

## 2022-01-04 DIAGNOSIS — M65342 Trigger finger, left ring finger: Secondary | ICD-10-CM | POA: Diagnosis not present

## 2022-01-04 DIAGNOSIS — M65341 Trigger finger, right ring finger: Secondary | ICD-10-CM | POA: Diagnosis not present

## 2022-01-04 DIAGNOSIS — M79642 Pain in left hand: Secondary | ICD-10-CM | POA: Diagnosis not present

## 2022-01-07 ENCOUNTER — Other Ambulatory Visit (HOSPITAL_BASED_OUTPATIENT_CLINIC_OR_DEPARTMENT_OTHER): Payer: Self-pay

## 2022-01-11 ENCOUNTER — Other Ambulatory Visit (HOSPITAL_BASED_OUTPATIENT_CLINIC_OR_DEPARTMENT_OTHER): Payer: Self-pay

## 2022-01-15 ENCOUNTER — Other Ambulatory Visit (HOSPITAL_BASED_OUTPATIENT_CLINIC_OR_DEPARTMENT_OTHER): Payer: Self-pay

## 2022-01-17 ENCOUNTER — Other Ambulatory Visit: Payer: Self-pay | Admitting: Family Medicine

## 2022-01-18 ENCOUNTER — Other Ambulatory Visit (HOSPITAL_BASED_OUTPATIENT_CLINIC_OR_DEPARTMENT_OTHER): Payer: Self-pay

## 2022-01-18 MED ORDER — HYDROXYCHLOROQUINE SULFATE 200 MG PO TABS
400.0000 mg | ORAL_TABLET | Freq: Every day | ORAL | 1 refills | Status: DC
  Filled 2022-01-18 – 2022-03-13 (×5): qty 180, 90d supply, fill #0
  Filled 2022-06-23: qty 180, 90d supply, fill #1

## 2022-01-20 ENCOUNTER — Other Ambulatory Visit (HOSPITAL_BASED_OUTPATIENT_CLINIC_OR_DEPARTMENT_OTHER): Payer: Self-pay

## 2022-01-21 ENCOUNTER — Other Ambulatory Visit (HOSPITAL_BASED_OUTPATIENT_CLINIC_OR_DEPARTMENT_OTHER): Payer: Self-pay

## 2022-01-27 DIAGNOSIS — M0609 Rheumatoid arthritis without rheumatoid factor, multiple sites: Secondary | ICD-10-CM | POA: Diagnosis not present

## 2022-01-28 DIAGNOSIS — M533 Sacrococcygeal disorders, not elsewhere classified: Secondary | ICD-10-CM | POA: Diagnosis not present

## 2022-01-28 DIAGNOSIS — M545 Low back pain, unspecified: Secondary | ICD-10-CM | POA: Diagnosis not present

## 2022-01-28 DIAGNOSIS — M5417 Radiculopathy, lumbosacral region: Secondary | ICD-10-CM | POA: Diagnosis not present

## 2022-01-28 DIAGNOSIS — G8929 Other chronic pain: Secondary | ICD-10-CM | POA: Diagnosis not present

## 2022-01-28 DIAGNOSIS — Z981 Arthrodesis status: Secondary | ICD-10-CM | POA: Diagnosis not present

## 2022-01-28 DIAGNOSIS — M797 Fibromyalgia: Secondary | ICD-10-CM | POA: Diagnosis not present

## 2022-01-28 DIAGNOSIS — M2550 Pain in unspecified joint: Secondary | ICD-10-CM | POA: Diagnosis not present

## 2022-01-28 DIAGNOSIS — M461 Sacroiliitis, not elsewhere classified: Secondary | ICD-10-CM | POA: Diagnosis not present

## 2022-02-01 DIAGNOSIS — M65331 Trigger finger, right middle finger: Secondary | ICD-10-CM | POA: Diagnosis not present

## 2022-02-01 DIAGNOSIS — M79642 Pain in left hand: Secondary | ICD-10-CM | POA: Diagnosis not present

## 2022-02-01 DIAGNOSIS — M65332 Trigger finger, left middle finger: Secondary | ICD-10-CM | POA: Diagnosis not present

## 2022-02-01 DIAGNOSIS — M65342 Trigger finger, left ring finger: Secondary | ICD-10-CM | POA: Diagnosis not present

## 2022-02-01 DIAGNOSIS — M65341 Trigger finger, right ring finger: Secondary | ICD-10-CM | POA: Diagnosis not present

## 2022-02-02 ENCOUNTER — Other Ambulatory Visit (HOSPITAL_BASED_OUTPATIENT_CLINIC_OR_DEPARTMENT_OTHER): Payer: Self-pay

## 2022-02-03 ENCOUNTER — Telehealth: Payer: Self-pay | Admitting: *Deleted

## 2022-02-03 ENCOUNTER — Inpatient Hospital Stay: Payer: 59 | Attending: Family

## 2022-02-03 ENCOUNTER — Inpatient Hospital Stay (HOSPITAL_BASED_OUTPATIENT_CLINIC_OR_DEPARTMENT_OTHER): Payer: 59 | Admitting: Family

## 2022-02-03 ENCOUNTER — Encounter: Payer: Self-pay | Admitting: Family

## 2022-02-03 VITALS — BP 131/59 | HR 66 | Temp 98.2°F | Resp 17 | Wt 201.8 lb

## 2022-02-03 DIAGNOSIS — D509 Iron deficiency anemia, unspecified: Secondary | ICD-10-CM

## 2022-02-03 DIAGNOSIS — Z9884 Bariatric surgery status: Secondary | ICD-10-CM | POA: Diagnosis not present

## 2022-02-03 DIAGNOSIS — K912 Postsurgical malabsorption, not elsewhere classified: Secondary | ICD-10-CM | POA: Diagnosis not present

## 2022-02-03 DIAGNOSIS — G629 Polyneuropathy, unspecified: Secondary | ICD-10-CM | POA: Insufficient documentation

## 2022-02-03 DIAGNOSIS — D508 Other iron deficiency anemias: Secondary | ICD-10-CM | POA: Diagnosis not present

## 2022-02-03 LAB — CBC WITH DIFFERENTIAL (CANCER CENTER ONLY)
Abs Immature Granulocytes: 0.06 10*3/uL (ref 0.00–0.07)
Basophils Absolute: 0 10*3/uL (ref 0.0–0.1)
Basophils Relative: 1 %
Eosinophils Absolute: 0 10*3/uL (ref 0.0–0.5)
Eosinophils Relative: 0 %
HCT: 41.2 % (ref 36.0–46.0)
Hemoglobin: 13.5 g/dL (ref 12.0–15.0)
Immature Granulocytes: 1 %
Lymphocytes Relative: 30 %
Lymphs Abs: 2.3 10*3/uL (ref 0.7–4.0)
MCH: 31 pg (ref 26.0–34.0)
MCHC: 32.8 g/dL (ref 30.0–36.0)
MCV: 94.5 fL (ref 80.0–100.0)
Monocytes Absolute: 0.8 10*3/uL (ref 0.1–1.0)
Monocytes Relative: 11 %
Neutro Abs: 4.3 10*3/uL (ref 1.7–7.7)
Neutrophils Relative %: 57 %
Platelet Count: 151 10*3/uL (ref 150–400)
RBC: 4.36 MIL/uL (ref 3.87–5.11)
RDW: 12.2 % (ref 11.5–15.5)
WBC Count: 7.6 10*3/uL (ref 4.0–10.5)
nRBC: 0 % (ref 0.0–0.2)

## 2022-02-03 LAB — RETICULOCYTES
Immature Retic Fract: 11.4 % (ref 2.3–15.9)
RBC.: 4.4 MIL/uL (ref 3.87–5.11)
Retic Count, Absolute: 77 10*3/uL (ref 19.0–186.0)
Retic Ct Pct: 1.8 % (ref 0.4–3.1)

## 2022-02-03 LAB — IRON AND IRON BINDING CAPACITY (CC-WL,HP ONLY)
Iron: 86 ug/dL (ref 28–170)
Saturation Ratios: 26 % (ref 10.4–31.8)
TIBC: 329 ug/dL (ref 250–450)
UIBC: 243 ug/dL (ref 148–442)

## 2022-02-03 LAB — FERRITIN: Ferritin: 120 ng/mL (ref 11–307)

## 2022-02-03 NOTE — Progress Notes (Signed)
Hematology and Oncology Follow Up Visit  [name] [id number] [birth year] 66 y.o. 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 She is doing fairly well but still recuperating from back surgery.  She is out of her back brace and taking lyrica for nerve pain.  She has been chewing ice.  No blood loss noted. No bruising, no petechiae.  No fever, chills, n/v, cough, rash, SOB, chest pain, palpitations, abdominal pain or changes in bowel or bladder habits.  She notes occasional dizziness with orthostatic hypotension.  No swelling or tenderness in her extremities.  Neuropathy in the hands and feet unchanged from baseline.  No falls or syncope reported.  Appetite and hydration are good. She has had a metallic taste in her mouth with Ozempic.  Her weight is 201 lbs.   ECOG Performance Status: 1 - Symptomatic but completely ambulatory  Medications:  Allergies as of 02/03/2022       Reactions   Gabapentin Swelling   Pt states she has eye swelling    Isoniazid    Severe SOB   Morphine Hives, Nausea And Vomiting   Allergic to PCA pump only   Nitrofurantoin Shortness Of Breath, Rash, Other (See Comments)   REACTION: welts   Oseltamivir Phosphate Nausea And Vomiting   "I vomited within 30 minutes of taking it and was told I cannot ever take it again."   Tamiflu [oseltamivir Phosphate] Nausea And Vomiting   "I vomited within 30 minutes of taking it and was told I cannot ever take it again."   Hctz [hydrochlorothiazide] Rash   Macrolides And Ketolides Rash   Promethazine Hcl Other (See Comments)   IF GIVEN  IV-hallucinations     CAN TAKE PO PHENERGAN Other reaction(s): Unknown   Roxicet [oxycodone-acetaminophen] Itching   Sulfa Antibiotics Rash   Wound Dressing Adhesive Rash   Adhesive Tape    Fentanyl  Hives, Rash   REACTION: welts   Oseltamivir Other (See Comments)   Other reaction(s): Unknown   Other Rash, Other (See Comments)   bandaids   Sulfamethoxazole-trimethoprim Rash        Medication List        Accurate as of February 03, 2022  8:41 AM. If you have any questions, ask your nurse or doctor.          acetaminophen 650 MG CR tablet Commonly known as: TYLENOL Take 1,300 mg by mouth as needed.   calcitonin (salmon) 200 UNIT/ACT nasal spray Commonly known as: MIACALCIN/FORTICAL Instill 1 spray by nasal route every day, alternating nostrils.   CALTRATE GUMMY BITES PO Take 2 each 2 (two) times daily by mouth.   Centrum Adults Tabs take 2 tablets by oral route  every day with food   Cholecalciferol 125 MCG (5000 UT) Tabs take 1 tablet by oral route every day   cyclobenzaprine 10 MG tablet Commonly known as: FLEXERIL Take 1 tablet (10 mg total) by mouth before bedtime for muscle spasms .   diclofenac Sodium 1 % Gel Commonly known as: VOLTAREN Apply 2 g topically See admin instructions.   Dotti 0.1 MG/24HR patch Generic drug: estradiol Apply 1 patch twice a week by transdermal route.   DULoxetine 30 MG capsule Commonly known as: CYMBALTA Take 1 capsule (30 mg total) by mouth 2 (two)  times daily.   Emgality 120 MG/ML Soaj Generic drug: Galcanezumab-gnlm INJECT 120 MG INTO THE SKIN EVERY 28 (TWENTY-EIGHT) DAYS.   folic acid 1 MG tablet Commonly known as: FOLVITE 1 tablet   furosemide 20 MG tablet Commonly known as: LASIX TAKE 1 TABLET BY MOUTH EVERY MORNING   glucose blood test strip Test blood sugar once daily. Dx Code: E11.9   hydrALAZINE 25 MG tablet Commonly known as: APRESOLINE See admin instructions.   hydrALAZINE 25 MG tablet Commonly known as: APRESOLINE Take 1 tablet (25 mg total) by mouth 2 (two) times daily. Hold if feeling dizzy or SBP<110 mmHg).  Can use additional 25 mg as needed for SBP>150 mmHg   hydroxychloroquine 200 MG  tablet Commonly known as: PLAQUENIL Take 2 tablets by mouth once daily   hydroxychloroquine 200 MG tablet Commonly known as: PLAQUENIL Take 2 tablets (400 mg total) by mouth daily.   Jublia 10 % Soln Generic drug: Efinaconazole Jublia 10 % topical solution with applicator  APPLY TO AFFECTED TOENAILS ONCE DAILY FOR 48 WEEKS   Jublia 10 % Soln Generic drug: Efinaconazole   meclizine 25 MG tablet Commonly known as: ANTIVERT Take 1 tablet (25 mg total) by mouth 3 (three) times daily as needed for dizziness.   methocarbamol 500 MG tablet Commonly known as: ROBAXIN Take 1 tablet (500 mg total) by mouth 4 (four) times daily as needed for muscle spasms.   NONFORMULARY OR COMPOUNDED ITEM bp cuff  Dx hypertension   nystatin powder Commonly known as: nystatin Apply 1 application topically 3 (three) times daily.   olmesartan 20 MG tablet Commonly known as: BENICAR Take 1 tablet (20 mg total) by mouth daily with supper.   oxyCODONE-acetaminophen 5-325 MG tablet Commonly known as: Percocet Take 1 tablet by mouth every 12 (twelve) hours as needed.   Ozempic (2 MG/DOSE) 8 MG/3ML Sopn Generic drug: Semaglutide (2 MG/DOSE) Inject 2 mg as directed once a week.   pantoprazole 40 MG tablet Commonly known as: PROTONIX Take 1 tablet (40 mg total) by mouth daily.   potassium chloride 10 MEQ tablet Commonly known as: KLOR-CON M TAKE 1 TABLET (10 MEQ TOTAL) BY MOUTH DAILY.   pregabalin 150 MG capsule Commonly known as: Lyrica Take 1 capsule by mouth at bedtime   pregabalin 150 MG capsule Commonly known as: LYRICA 1 capsule   pregabalin 150 MG capsule Commonly known as: Lyrica Take 1 capsule (150 mg total) by mouth at bedtime.   pregabalin 150 MG capsule Commonly known as: LYRICA Take one capsule (150 mg dose) by mouth 3 (three) times a day for 30 days. Max Daily Amount: 450 mg   Premarin vaginal cream Generic drug: conjugated estrogens INSERT 1 GRAM of cream per vagina  THREE TIMES A WEEK BY VAGINAL ROUTE.   rosuvastatin 20 MG tablet Commonly known as: CRESTOR TAKE 1 TABLET BY MOUTH ONCE DAILY   TechLite Pen Needles 32G X 6 MM Misc Generic drug: Insulin Pen Needle Use with Ozempic as directed   temazepam 30 MG capsule Commonly known as: RESTORIL Take 1 capsule (30 mg total) by mouth at bedtime as needed for sleep.   thiamine 100 MG tablet Commonly known as: Vitamin B-1 Take 100 mg daily by mouth.   tocilizumab 400 MG/20ML Soln injection Commonly known as: ACTEMRA Inject 400 mg into the vein every 30 (thirty) days.   Ubrelvy 100 MG Tabs Generic drug: Ubrogepant TAKE ONE TABLET BY MOUTH DAILY AS NEEDED FOR MIGRAINE. MAX 2 TABLETS IN 24  HOURS        Allergies:  Allergies  Allergen Reactions   Gabapentin Swelling    Pt states she has eye swelling    Isoniazid     Severe SOB   Morphine Hives and Nausea And Vomiting    Allergic to PCA pump only    Nitrofurantoin Shortness Of Breath, Rash and Other (See Comments)    REACTION: welts   Oseltamivir Phosphate Nausea And Vomiting    "I vomited within 30 minutes of taking it and was told I cannot ever take it again."   Tamiflu [Oseltamivir Phosphate] Nausea And Vomiting    "I vomited within 30 minutes of taking it and was told I cannot ever take it again."   Hctz [Hydrochlorothiazide] Rash   Macrolides And Ketolides Rash   Promethazine Hcl Other (See Comments)    IF GIVEN  IV-hallucinations     CAN TAKE PO PHENERGAN Other reaction(s): Unknown   Roxicet [Oxycodone-Acetaminophen] Itching   Sulfa Antibiotics Rash   Wound Dressing Adhesive Rash    Adhesive Tape    Fentanyl Hives and Rash    REACTION: welts   Oseltamivir Other (See Comments)    Other reaction(s): Unknown    Other Rash and Other (See Comments)    bandaids   Sulfamethoxazole-Trimethoprim Rash    Past Medical History, Surgical history, Social history, and Family History were reviewed and updated.  Review of  Systems: All other 10 point review of systems is negative.   Physical Exam:  weight is 201 lb 12.8 oz (91.5 kg). Her oral temperature is 98.2 F (36.8 C). Her blood pressure is 131/59 (abnormal) and her pulse is 66. Her respiration is 17 and oxygen saturation is 100%.   Wt Readings from Last 3 Encounters:  02/03/22 201 lb 12.8 oz (91.5 kg)  10/22/21 206 lb 9.6 oz (93.7 kg)  10/07/21 210 lb 12.8 oz (95.6 kg)    Ocular: Sclerae unicteric, pupils equal, round and reactive to light Ear-nose-throat: Oropharynx clear, dentition fair Lymphatic: No cervical or supraclavicular adenopathy Lungs no rales or rhonchi, good excursion bilaterally Heart regular rate and rhythm, no murmur appreciated Abd soft, nontender, positive bowel sounds MSK no focal spinal tenderness, no joint edema Neuro: non-focal, well-oriented, appropriate affect Breasts: Deferred   Lab Results  Component Value Date   WBC 7.6 02/03/2022   HGB 13.5 02/03/2022   HCT 41.2 02/03/2022   MCV 94.5 02/03/2022   PLT 151 02/03/2022   Lab Results  Component Value Date   FERRITIN 212 10/07/2021   IRON 126 10/07/2021   TIBC 307 10/07/2021   UIBC 181 10/07/2021   IRONPCTSAT 41 (H) 10/07/2021   Lab Results  Component Value Date   RETICCTPCT 1.8 02/03/2022   RBC 4.36 02/03/2022   RBC 4.40 02/03/2022   No results found for: "KPAFRELGTCHN", "LAMBDASER", "KAPLAMBRATIO" No results found for: "IGGSERUM", "IGA", "IGMSERUM" No results found for: "TOTALPROTELP", "ALBUMINELP", "A1GS", "A2GS", "BETS", "BETA2SER", "GAMS", "MSPIKE", "SPEI"   Chemistry      Component Value Date/Time   NA 139 10/22/2021 0957   NA 136 (A) 09/08/2020 0000   K 4.3 10/22/2021 0957   CL 101 10/22/2021 0957   CO2 33 (H) 10/22/2021 0957   BUN 11 10/22/2021 0957   BUN 16 09/08/2020 0000   CREATININE 0.82 10/22/2021 0957   CREATININE 0.86 05/11/2021 1435   GLU 162 09/08/2020 0000      Component Value Date/Time   CALCIUM 9.6 10/22/2021 0957    ALKPHOS 82 10/22/2021  0957   AST 29 10/22/2021 0957   AST 25 05/11/2021 1435   ALT 30 10/22/2021 0957   ALT 24 05/11/2021 1435   BILITOT 1.0 10/22/2021 0957   BILITOT 0.6 05/11/2021 1435       Impression and Plan: Ms. Encarnacion is a very pleasant 66 yo caucasian female with anemia secondary to malabsorption s/p gastric bypass in 2018.  Iron studies pending.  Follow-up in 6 months.   Lottie Dawson, NP 7/26/20238:41 AM

## 2022-02-03 NOTE — Telephone Encounter (Signed)
Per 02/03/22 los - called and lvm of upcoming appointment - requested callback to confirm

## 2022-02-08 ENCOUNTER — Other Ambulatory Visit (HOSPITAL_BASED_OUTPATIENT_CLINIC_OR_DEPARTMENT_OTHER): Payer: Self-pay

## 2022-02-09 ENCOUNTER — Other Ambulatory Visit (HOSPITAL_BASED_OUTPATIENT_CLINIC_OR_DEPARTMENT_OTHER): Payer: Self-pay

## 2022-02-10 ENCOUNTER — Encounter: Payer: Self-pay | Admitting: Podiatry

## 2022-02-10 ENCOUNTER — Ambulatory Visit: Payer: 59 | Admitting: Podiatry

## 2022-02-10 DIAGNOSIS — B351 Tinea unguium: Secondary | ICD-10-CM | POA: Diagnosis not present

## 2022-02-10 DIAGNOSIS — M79675 Pain in left toe(s): Secondary | ICD-10-CM | POA: Diagnosis not present

## 2022-02-10 DIAGNOSIS — M79674 Pain in right toe(s): Secondary | ICD-10-CM

## 2022-02-10 DIAGNOSIS — E1142 Type 2 diabetes mellitus with diabetic polyneuropathy: Secondary | ICD-10-CM | POA: Diagnosis not present

## 2022-02-10 MED ORDER — JUBLIA 10 % EX SOLN: CUTANEOUS | 11 refills | Status: DC

## 2022-02-10 NOTE — Progress Notes (Signed)
Subjective:  Patient ID: [name], female    DOB: 1955-09-30,  MRN: [id number]  MIKAILI FLIPPIN presents to clinic today for at risk foot care with history of diabetic neuropathy and painful thick toenails that are difficult to trim. Pain interferes with ambulation. Aggravating factors include wearing enclosed shoe gear. Pain is relieved with periodic professional debridement.  Patient states blood glucose was 70 mg/dl today.  Last A1c was 7.1%.  Last known HgA1c was unknown.    New problem(s): None.   She is requesting  refill of Jublia today.  PCP is Carollee Herter, Alferd Apa, DO , and last visit was  October 22, 2021  Allergies  Allergen Reactions   Gabapentin Swelling    Pt states she has eye swelling    Isoniazid     Severe SOB   Morphine Hives and Nausea And Vomiting    Allergic to PCA pump only    Nitrofurantoin Shortness Of Breath, Rash and Other (See Comments)    REACTION: welts   Oseltamivir Phosphate Nausea And Vomiting    "I vomited within 30 minutes of taking it and was told I cannot ever take it again."   Tamiflu [Oseltamivir Phosphate] Nausea And Vomiting    "I vomited within 30 minutes of taking it and was told I cannot ever take it again."   Hctz [Hydrochlorothiazide] Rash   Macrolides And Ketolides Rash   Promethazine Hcl Other (See Comments)    IF GIVEN  IV-hallucinations     CAN TAKE PO PHENERGAN Other reaction(s): Unknown   Roxicet [Oxycodone-Acetaminophen] Itching   Sulfa Antibiotics Rash   Wound Dressing Adhesive Rash    Adhesive Tape    Fentanyl Hives and Rash    REACTION: welts   Oseltamivir Other (See Comments)    Other reaction(s): Unknown    Other Rash and Other (See Comments)    bandaids   Sulfamethoxazole-Trimethoprim Rash    Review of Systems: Negative except as noted in the HPI.  Objective:  There were no vitals filed for this visit.  GERALDYNE BARRACLOUGH is a pleasant 66 y.o. female in NAD. AAO X 3.  Vascular Examination: CFT  <3 seconds b/l LE. Palpable DP/PT pulses b/l LE. Digital hair absent b/l. Skin temperature gradient WNL b/l. No pain with calf compression b/l. No edema noted b/l. No cyanosis or clubbing noted b/l LE.  Dermatological Examination: Pedal integument with normal turgor, texture and tone BLE. No open wounds b/l LE. No interdigital macerations noted b/l LE. No hyperkeratotic lesion noted today.  Toenails 1-5 b/l discolored, dystrophic, thickened, crumbly with subungual debris and tenderness to dorsal palpation.  Musculoskeletal Examination: Inversion deformity of right ankle. Clawtoe deformity R 3rd toe, R 4th toe, and R 5th toe.  Neurological Examination: Pt has subjective symptoms of neuropathy. Protective sensation intact 5/5 intact bilaterally with 10g monofilament b/l.     Latest Ref Rng & Units 10/22/2021    9:57 AM 04/21/2021   10:41 AM  Hemoglobin A1C  Hemoglobin-A1c 4.6 - 6.5 % 6.1  7.3    Assessment/Plan: 1. Pain due to onychomycosis of toenails of both feet   2. Diabetic peripheral neuropathy associated with type 2 diabetes mellitus ([place])     -Examined patient. -Patient to continue soft, supportive shoe gear daily. -Patient instructed to continue use of Jublia (Efinzonazole) 10% Solution to affected toenails once daily. -Toenails 1-5 b/l were debrided in length and girth with sterile nail nippers and dremel without iatrogenic bleeding.  -Jublia refilled today. -  Patient/POA to call should there be question/concern in the interim.   Return in about 3 months (around 05/13/2022).  Marzetta Board, DPM

## 2022-02-19 ENCOUNTER — Other Ambulatory Visit: Payer: Self-pay | Admitting: Family Medicine

## 2022-02-19 ENCOUNTER — Other Ambulatory Visit (HOSPITAL_BASED_OUTPATIENT_CLINIC_OR_DEPARTMENT_OTHER): Payer: Self-pay

## 2022-02-19 ENCOUNTER — Other Ambulatory Visit: Payer: Self-pay | Admitting: *Deleted

## 2022-02-19 MED ORDER — DULOXETINE HCL 30 MG PO CPEP
30.0000 mg | ORAL_CAPSULE | Freq: Two times a day (BID) | ORAL | 1 refills | Status: DC
  Filled 2022-02-19 – 2022-02-23 (×2): qty 180, 90d supply, fill #0

## 2022-02-19 MED ORDER — OZEMPIC (2 MG/DOSE) 8 MG/3ML ~~LOC~~ SOPN
2.0000 mg | PEN_INJECTOR | SUBCUTANEOUS | 3 refills | Status: DC
  Filled 2022-02-19 – 2022-03-13 (×3): qty 3, 28d supply, fill #0
  Filled 2022-03-31 – 2022-04-08 (×2): qty 3, 28d supply, fill #1

## 2022-02-23 ENCOUNTER — Other Ambulatory Visit (HOSPITAL_BASED_OUTPATIENT_CLINIC_OR_DEPARTMENT_OTHER): Payer: Self-pay

## 2022-02-24 DIAGNOSIS — M0609 Rheumatoid arthritis without rheumatoid factor, multiple sites: Secondary | ICD-10-CM | POA: Diagnosis not present

## 2022-02-25 ENCOUNTER — Other Ambulatory Visit (HOSPITAL_BASED_OUTPATIENT_CLINIC_OR_DEPARTMENT_OTHER): Payer: Self-pay

## 2022-03-02 ENCOUNTER — Other Ambulatory Visit (HOSPITAL_BASED_OUTPATIENT_CLINIC_OR_DEPARTMENT_OTHER): Payer: Self-pay

## 2022-03-05 ENCOUNTER — Other Ambulatory Visit (HOSPITAL_BASED_OUTPATIENT_CLINIC_OR_DEPARTMENT_OTHER): Payer: Self-pay

## 2022-03-08 ENCOUNTER — Other Ambulatory Visit: Payer: Self-pay | Admitting: Podiatry

## 2022-03-11 ENCOUNTER — Other Ambulatory Visit (HOSPITAL_BASED_OUTPATIENT_CLINIC_OR_DEPARTMENT_OTHER): Payer: Self-pay

## 2022-03-12 ENCOUNTER — Other Ambulatory Visit (HOSPITAL_BASED_OUTPATIENT_CLINIC_OR_DEPARTMENT_OTHER): Payer: Self-pay

## 2022-03-13 ENCOUNTER — Other Ambulatory Visit: Payer: Self-pay | Admitting: Family Medicine

## 2022-03-16 ENCOUNTER — Other Ambulatory Visit (HOSPITAL_BASED_OUTPATIENT_CLINIC_OR_DEPARTMENT_OTHER): Payer: Self-pay

## 2022-03-16 ENCOUNTER — Encounter: Payer: Self-pay | Admitting: Neurology

## 2022-03-16 ENCOUNTER — Encounter: Payer: Self-pay | Admitting: Family

## 2022-03-16 MED ORDER — TEMAZEPAM 30 MG PO CAPS
30.0000 mg | ORAL_CAPSULE | Freq: Every evening | ORAL | 1 refills | Status: DC | PRN
Start: 2022-03-16 — End: 2022-04-22
  Filled 2022-03-16 – 2022-03-30 (×2): qty 90, 90d supply, fill #0

## 2022-03-16 NOTE — Telephone Encounter (Signed)
Requesting: temazepam '30mg'$   Contract: None [id number] Last Visit: 10/22/21 Next Visit:04/22/22 Last Refill:09/28/21 #90 and 0RF  Please Advise

## 2022-03-17 ENCOUNTER — Telehealth: Payer: Self-pay

## 2022-03-17 ENCOUNTER — Other Ambulatory Visit (HOSPITAL_COMMUNITY): Payer: Self-pay

## 2022-03-17 DIAGNOSIS — M65342 Trigger finger, left ring finger: Secondary | ICD-10-CM | POA: Diagnosis not present

## 2022-03-17 DIAGNOSIS — M79642 Pain in left hand: Secondary | ICD-10-CM | POA: Diagnosis not present

## 2022-03-17 DIAGNOSIS — M65331 Trigger finger, right middle finger: Secondary | ICD-10-CM | POA: Diagnosis not present

## 2022-03-17 DIAGNOSIS — M65341 Trigger finger, right ring finger: Secondary | ICD-10-CM | POA: Diagnosis not present

## 2022-03-17 DIAGNOSIS — M65332 Trigger finger, left middle finger: Secondary | ICD-10-CM | POA: Diagnosis not present

## 2022-03-17 NOTE — Telephone Encounter (Signed)
Efinaconazole (JUBLIA) 10 % SOLN  PA was started today on 03/17/22

## 2022-03-18 ENCOUNTER — Other Ambulatory Visit (HOSPITAL_BASED_OUTPATIENT_CLINIC_OR_DEPARTMENT_OTHER): Payer: Self-pay

## 2022-03-19 ENCOUNTER — Other Ambulatory Visit (HOSPITAL_BASED_OUTPATIENT_CLINIC_OR_DEPARTMENT_OTHER): Payer: Self-pay

## 2022-03-19 ENCOUNTER — Other Ambulatory Visit (HOSPITAL_COMMUNITY): Payer: Self-pay

## 2022-03-19 ENCOUNTER — Telehealth (HOSPITAL_COMMUNITY): Payer: Self-pay | Admitting: Pharmacy Technician

## 2022-03-19 NOTE — Telephone Encounter (Signed)
Patient Advocate Encounter   Received notification that prior authorization for Emgality '120MG'$ /ML auto-injectors (migraine) is required.   PA submitted on 03/19/2022 Key [id number] Status is pending       Lyndel Safe, [place] Patient Advocate Specialist [place] Patient Advocate Team Direct Number: [phone]  Fax: [phone]

## 2022-03-22 ENCOUNTER — Other Ambulatory Visit: Payer: Self-pay | Admitting: Family Medicine

## 2022-03-22 ENCOUNTER — Other Ambulatory Visit (HOSPITAL_BASED_OUTPATIENT_CLINIC_OR_DEPARTMENT_OTHER): Payer: Self-pay

## 2022-03-22 DIAGNOSIS — I10 Essential (primary) hypertension: Secondary | ICD-10-CM

## 2022-03-23 ENCOUNTER — Other Ambulatory Visit (HOSPITAL_BASED_OUTPATIENT_CLINIC_OR_DEPARTMENT_OTHER): Payer: Self-pay

## 2022-03-23 MED ORDER — POTASSIUM CHLORIDE CRYS ER 10 MEQ PO TBCR
10.0000 meq | EXTENDED_RELEASE_TABLET | Freq: Every day | ORAL | 0 refills | Status: DC
  Filled 2022-03-23: qty 88, 88d supply, fill #0
  Filled 2022-03-23: qty 2, 2d supply, fill #0

## 2022-03-23 MED ORDER — FUROSEMIDE 20 MG PO TABS
20.0000 mg | ORAL_TABLET | Freq: Every morning | ORAL | 0 refills | Status: DC
  Filled 2022-03-23: qty 90, 90d supply, fill #0

## 2022-03-24 ENCOUNTER — Other Ambulatory Visit (HOSPITAL_BASED_OUTPATIENT_CLINIC_OR_DEPARTMENT_OTHER): Payer: Self-pay

## 2022-03-24 DIAGNOSIS — M0609 Rheumatoid arthritis without rheumatoid factor, multiple sites: Secondary | ICD-10-CM | POA: Diagnosis not present

## 2022-03-25 ENCOUNTER — Other Ambulatory Visit (HOSPITAL_BASED_OUTPATIENT_CLINIC_OR_DEPARTMENT_OTHER): Payer: Self-pay

## 2022-03-25 DIAGNOSIS — M7918 Myalgia, other site: Secondary | ICD-10-CM | POA: Diagnosis not present

## 2022-03-25 DIAGNOSIS — M5416 Radiculopathy, lumbar region: Secondary | ICD-10-CM | POA: Diagnosis not present

## 2022-03-25 DIAGNOSIS — G894 Chronic pain syndrome: Secondary | ICD-10-CM | POA: Diagnosis not present

## 2022-03-25 DIAGNOSIS — M2550 Pain in unspecified joint: Secondary | ICD-10-CM | POA: Diagnosis not present

## 2022-03-25 DIAGNOSIS — M797 Fibromyalgia: Secondary | ICD-10-CM | POA: Diagnosis not present

## 2022-03-25 DIAGNOSIS — Z981 Arthrodesis status: Secondary | ICD-10-CM | POA: Diagnosis not present

## 2022-03-25 MED ORDER — PREGABALIN 150 MG PO CAPS
150.0000 mg | ORAL_CAPSULE | Freq: Three times a day (TID) | ORAL | 2 refills | Status: DC
  Filled 2022-03-31: qty 90, 30d supply, fill #0

## 2022-03-25 NOTE — Telephone Encounter (Signed)
Patient Advocate Encounter  Prior Authorization for Emgality '120MG'$ /ML auto-injectors  has been approved.    PA# [id number] Effective dates: 03/23/2022 through 03/23/2023      Lyndel Safe, [place] Patient Advocate Specialist [place] Patient Advocate Team Direct Number: [phone]  Fax: [phone]

## 2022-03-29 ENCOUNTER — Other Ambulatory Visit (HOSPITAL_BASED_OUTPATIENT_CLINIC_OR_DEPARTMENT_OTHER): Payer: Self-pay

## 2022-03-29 ENCOUNTER — Encounter: Payer: Self-pay | Admitting: Family

## 2022-03-29 DIAGNOSIS — R748 Abnormal levels of other serum enzymes: Secondary | ICD-10-CM | POA: Diagnosis not present

## 2022-03-29 DIAGNOSIS — G8929 Other chronic pain: Secondary | ICD-10-CM | POA: Diagnosis not present

## 2022-03-29 DIAGNOSIS — Z79899 Other long term (current) drug therapy: Secondary | ICD-10-CM | POA: Diagnosis not present

## 2022-03-29 DIAGNOSIS — M79643 Pain in unspecified hand: Secondary | ICD-10-CM | POA: Diagnosis not present

## 2022-03-29 DIAGNOSIS — M549 Dorsalgia, unspecified: Secondary | ICD-10-CM | POA: Diagnosis not present

## 2022-03-29 DIAGNOSIS — M1990 Unspecified osteoarthritis, unspecified site: Secondary | ICD-10-CM | POA: Diagnosis not present

## 2022-03-29 DIAGNOSIS — M797 Fibromyalgia: Secondary | ICD-10-CM | POA: Diagnosis not present

## 2022-03-29 DIAGNOSIS — M2550 Pain in unspecified joint: Secondary | ICD-10-CM | POA: Diagnosis not present

## 2022-03-29 DIAGNOSIS — M0609 Rheumatoid arthritis without rheumatoid factor, multiple sites: Secondary | ICD-10-CM | POA: Diagnosis not present

## 2022-03-30 ENCOUNTER — Other Ambulatory Visit (HOSPITAL_BASED_OUTPATIENT_CLINIC_OR_DEPARTMENT_OTHER): Payer: Self-pay

## 2022-03-30 MED ORDER — FLUAD QUADRIVALENT 0.5 ML IM PRSY
PREFILLED_SYRINGE | INTRAMUSCULAR | 0 refills | Status: DC
  Filled 2022-03-30: qty 0.5, 1d supply, fill #0

## 2022-03-31 ENCOUNTER — Other Ambulatory Visit (HOSPITAL_BASED_OUTPATIENT_CLINIC_OR_DEPARTMENT_OTHER): Payer: Self-pay

## 2022-03-31 ENCOUNTER — Other Ambulatory Visit: Payer: Self-pay | Admitting: Cardiology

## 2022-03-31 MED ORDER — HYDRALAZINE HCL 25 MG PO TABS
25.0000 mg | ORAL_TABLET | Freq: Two times a day (BID) | ORAL | 3 refills | Status: DC
  Filled 2022-03-31: qty 180, 90d supply, fill #0
  Filled 2022-07-06: qty 180, 90d supply, fill #1

## 2022-04-01 ENCOUNTER — Encounter: Payer: Self-pay | Admitting: Family

## 2022-04-01 ENCOUNTER — Other Ambulatory Visit (HOSPITAL_BASED_OUTPATIENT_CLINIC_OR_DEPARTMENT_OTHER): Payer: Self-pay

## 2022-04-05 ENCOUNTER — Other Ambulatory Visit (HOSPITAL_BASED_OUTPATIENT_CLINIC_OR_DEPARTMENT_OTHER): Payer: Self-pay

## 2022-04-06 ENCOUNTER — Other Ambulatory Visit (HOSPITAL_BASED_OUTPATIENT_CLINIC_OR_DEPARTMENT_OTHER): Payer: Self-pay

## 2022-04-06 ENCOUNTER — Ambulatory Visit
Admission: EM | Admit: 2022-04-06 | Discharge: 2022-04-06 | Disposition: A | Discharge status: Home / Self Care | Payer: 59 | Attending: Family Medicine | Admitting: Family Medicine

## 2022-04-06 DIAGNOSIS — J209 Acute bronchitis, unspecified: Secondary | ICD-10-CM | POA: Diagnosis not present

## 2022-04-06 MED ORDER — AMOXICILLIN 875 MG PO TABS
875.0000 mg | ORAL_TABLET | Freq: Two times a day (BID) | ORAL | 0 refills | Status: DC
  Filled 2022-04-06: qty 14, 7d supply, fill #0

## 2022-04-06 NOTE — ED Triage Notes (Signed)
Pt presents with cough and chest congestion that began yesterday. Pt endorses productive cough.

## 2022-04-06 NOTE — Discharge Instructions (Signed)
Continue to drink lots of fluids Take Mucinex or equivalent Use inhalers as needed Take amoxicillin 2 times a day for 7 days Follow-up with your primary care doctor Repeat COVID test if your symptoms fail to improve

## 2022-04-06 NOTE — ED Provider Notes (Signed)
[name]    CSN: [id number] Arrival date & time: 04/06/22  0818      History   Chief Complaint Chief Complaint  Patient presents with   Cough    HPI [name] is a 66 y.o. female.   HPI  Very pleasant retired Marine scientist.  States that she has cough and chest congestion.  Is coughing up thick green mucus.  States that she has had trouble with asthmatic bronchitis and tends to have bronchitis infections.  She is also immune compromised from her medications for rheumatoid arthritis.  She states that her primary care doctor is not available, but has told her she needs antibiotics when she starts to have chest infections and symptoms.  She did a COVID test at home.  She states that it was negative.  Not had fever or chills, body aches.  She has had some headache but states that she is prone to migraines. Patient has multiple medical problems and her chart is reviewed.  In addition to her rheumatoid arthritis she has fibromyalgia and generalized body pains.  She has hypertension, hyperlipidemia, and diabetes.  She has a very long medication list which is reconciled.  She has a long allergy list which is also reconciled.  Past Medical History:  Diagnosis Date   Allergic rhinitis    Anxiety    Asthma    hx bronchial asthma at times with upper resp infection   Depression    Fibromyalgia    GERD (gastroesophageal reflux disease)    Headache(784.0)    migraine and cluster headaches   Hyperlipemia    Hypertension    IBS (irritable bowel syndrome)    Menopause    Neuromuscular disorder ([place]) 2009   hx of fibromyalgia, polyarthralsia (surgery induced)   Osteoarthritis    Rheumatoid arthritis ([place])    Complicated by osteoarthritis as well.   Rheumatoid arthritis ([place])    Stress incontinence    at times   Tibial fracture 10/14/2016   evulsion periostial right   Type 2 diabetes mellitus with complication, without long-term current use of insulin (HCC)    diet  controlled no meds -> however was diagnosed with a diabetic foot ulcer.    Patient Active Problem List   Diagnosis Date Noted   Preventative health care 10/22/2021   Colon cancer screening 08/05/2021   Diabetic ulcer of toe of right foot associated with type 2 diabetes mellitus, limited to breakdown of skin () 06/19/2021   Tinea corporis 06/19/2021   Dysuria 06/19/2021   IDA (iron deficiency anemia) 05/18/2021   Pre-operative clearance 04/21/2021   Controlled type 2 diabetes mellitus with hyperglycemia, without long-term current use of insulin ([place]) 04/21/2021   Spinal stenosis of lumbar region 04/21/2021   Bronchitis due to COVID-19 virus 03/27/2021   Orthostatic hypotension 01/09/2021   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   Spondylosis of cervical spine 10/07/2020   Lumbar radiculopathy 10/07/2020   Anemia 09/11/2020   Intractable migraine 08/26/2020   Inflammatory arthritis 07/22/2020   Thrombocytopenia ([place]) 07/22/2020   Chronic nonintractable headache 07/21/2020   Syncope 07/21/2020   Dizziness 07/21/2020   Cellulitis of right hand 07/21/2020   Lumbar spondylolysis 05/13/2020   Primary hypertension 04/02/2020   Body mass index (BMI) 29.0-29.9, adult 04/02/2020   Menopausal symptom 03/11/2020   Stress 03/11/2020   Unspecified dyspareunia (CODE) 03/11/2020   Bilateral sacroiliitis ([place]) 03/04/2020   Senile calcific aortic valve sclerosis 02/08/2020   DOE (dyspnea on exertion) 02/08/2020  Murmur 01/07/2020   Acquired trigger finger of right ring finger 10/25/2019   Pain in right knee 08/24/2019   Rheumatoid arthritis involving multiple sites with positive rheumatoid factor ([place]) 08/02/2019   Primary osteoarthritis of both knees 08/02/2019   PTSD (post-traumatic stress disorder) 02/08/2019   Dog bite of arm, right, initial encounter 10/05/2018   Hyperlipidemia associated with type 2 diabetes mellitus ([place]) 08/24/2018   Symptomatic abdominal panniculus  12/12/2017   Carpal tunnel syndrome of left wrist 11/28/2017   Pain in right hand 11/08/2017   Cervical radiculopathy 10/06/2017   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   Keratosis 12/04/2014   Onychocryptosis 08/21/2014   Onychomycosis 05/21/2014   Fissured skin 01/22/2014   Fissure in skin of foot 12/18/2013   Porokeratosis 12/18/2013   Pain in lower limb 11/21/2013   Ingrown nail 07/20/2013   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 07/02/2013   Obesity (BMI 30-39.9) 06/25/2013   GERD (gastroesophageal reflux disease) 08/20/2011   Lapband APL + HH repair June 2011-Major revision Dec 2013 06/29/2011   ABDOMINAL PAIN, ACUTE 08/01/2010   VITAMIN B12 DEFICIENCY 04/01/2010   BARIATRIC SURGERY STATUS 01/29/2010   ONYCHOMYCOSIS, BILATERAL 06/30/2009   STRESS INCONTINENCE 06/10/2009   ACUTE PHARYNGITIS 04/29/2009   COUGH 04/27/2009   BRONCHITIS, ACUTE 04/16/2009   NAUSEA 04/08/2009   DIARRHEA 04/08/2009   MORBID OBESITY 03/03/2009   SKIN RASH 11/18/2008   UNSPECIFIED VITAMIN D DEFICIENCY 07/15/2008   OTHER SPECIFIED ANEMIAS 07/15/2008   Pain in joint, multiple sites 06/13/2008   Myalgia and myositis, unspecified 06/13/2008   BACK PAIN, LUMBAR 02/14/2008   ACUTE SINUSITIS, UNSPECIFIED 09/01/2007   CELLULITIS 08/12/2007   Cellulitis 08/12/2007   Depression with anxiety 06/09/2007   DEPRESSION 06/09/2007   Controlled type 2 diabetes mellitus with diabetic neuropathy, without long-term current use of insulin ([place]) 01/04/2007   Labile hypertension 01/04/2007   ALLERGIC RHINITIS 01/04/2007   HEADACHE 01/04/2007    Past Surgical History:  Procedure Laterality Date   ABDOMINAL HYSTERECTOMY  12/1999   complete   CARPAL TUNNEL RELEASE Right 12/27/2017   Procedure: CARPAL TUNNEL RELEASE;  Surgeon: Roseanne Kaufman, MD;  Location: [facility];  Service: Orthopedics;  Laterality: Right;   CERVICAL CONIZATION W/BX  june 1990   dysplasia of cervix/used 5Fu cream for 3 months    CERVICAL LAMINECTOMY  2005 & 2009   X 2   NO ROM [place]   X 2   CHOLECYSTECTOMY  1986   DILATION AND CURETTAGE OF UTERUS   [phone]   missed abortion   ESOPHAGOGASTRODUODENOSCOPY  06/29/2011   Procedure: ESOPHAGOGASTRODUODENOSCOPY (EGD);  Surgeon: Shann Medal, MD;  Location: [facility];  Service: General;  Laterality: N/A;   FOOT SURGERY  2005   RT HEEL   GASTRIC BANDING PORT REVISION  09/24/2011   Procedure: GASTRIC BANDING PORT REVISION;  Surgeon: Pedro Earls, MD;  Location: [facility];  Service: General;  Laterality: N/A;   GASTRIC ROUX-EN-Y N/A 06/06/2017   Procedure: Conversion from [place] to [place];  Surgeon: Johnathan Hausen, MD;  Location: [facility];  Service: General;  Laterality: N/A;   [place]  12/30/09   LAPAROSCOPIC GASTRIC SLEEVE RESECTION N/A 07/02/2013   Procedure: LAPAROSCOPIC GASTRIC SLEEVE RESECTION upper endoscopy;  Surgeon: Pedro Earls, MD;  Location: [facility];  Service: General;  Laterality: N/A;   LAPAROSCOPIC REPAIR AND REMOVAL OF GASTRIC  BAND N/A 07/02/2013   Procedure: LAPAROSCOPIC REMOVAL OF GASTRIC BAND;  Surgeon: Pedro Earls, MD;  Location: [facility];  Service: General;  Laterality: N/A;   LAPAROSCOPIC REVISION OF GASTRIC BAND  07/03/2012   Procedure: LAPAROSCOPIC REVISION OF GASTRIC BAND;  Surgeon: Pedro Earls, MD;  Location: [facility];  Service: General;  Laterality: N/A;  removal of old lap. band port, replaced with AP standard band   LAPAROSCOPY  09/24/2011   Procedure: LAPAROSCOPY DIAGNOSTIC;  Surgeon: Pedro Earls, MD;  Location: [facility];  Service: General;  Laterality: N/A;   LAPAROSCOPY  07/03/2012   Procedure: LAPAROSCOPY DIAGNOSTIC;  Surgeon: Pedro Earls, MD;  Location: [facility];  Service: General;  Laterality: N/A;  Exploratory Laparoscopy    MESH APPLIED TO LAP PORT  07/03/2012   Procedure: MESH APPLIED TO  LAP PORT;  Surgeon: Pedro Earls, MD;  Location: [facility];  Service: General;  Laterality: N/A;   [place]   X 2   right knee  1981   ARTHROSCOPY AND ARTHROTOMY   right knee arthroscopy and arthrotomy  12-1979   TONSILLECTOMY  1971   TUBAL LIGATION  1993   WITH C -SECTION    OB History     Gravida  3   Para  0   Term  0   Preterm  0   AB  1   Living  2      SAB  1   IAB  0   Ectopic  0   Multiple  0   Live Births               Home Medications    Prior to Admission medications   Medication Sig Start Date End Date Taking? Authorizing Provider  amoxicillin (AMOXIL) 875 MG tablet Take 1 tablet (875 mg total) by mouth 2 (two) times daily. 04/06/22  Yes Raylene Everts, MD  Abatacept (ORENCIA CLICKJECT) 947 MG/ML SOAJ     [provider]  acetaminophen (TYLENOL) 650 MG CR tablet Take by mouth.    [provider]  albuterol (VENTOLIN HFA) 108 (90 Base) MCG/ACT inhaler  04/21/21   [provider]  ALPRAZolam Duanne Moron) 0.25 MG tablet     [provider]  Ca Phosphate-Cholecalciferol (CALTRATE GUMMY BITES PO) Take 2 each 2 (two) times daily by mouth.     [provider]  calcitonin, salmon, (MIACALCIN/FORTICAL) 200 UNIT/ACT nasal spray Instill 1 spray by nasal route every day, alternating nostrils. 07/09/21     Cholecalciferol 125 MCG (5000 UT) TABS take 1 tablet by oral route every day    [provider]  conjugated estrogens (PREMARIN) vaginal cream INSERT 1 GRAM of cream per vagina THREE TIMES A WEEK BY VAGINAL ROUTE. 08/18/21     Cyanocobalamin 3000 MCG/ML LIQD as directed Sublingual    [provider]  cyclobenzaprine (FLEXERIL) 10 MG tablet Take 1 tablet (10 mg total) by mouth before bedtime for muscle spasms . 10/13/21     cyclobenzaprine (FLEXERIL) 10 MG tablet Take 1 tablet by mouth at bedtime as needed.    [provider]  diclofenac Sodium (VOLTAREN) 1 % GEL Apply  4 g topically 4 (four) times daily.    [provider]  DULoxetine (CYMBALTA) 30 MG capsule Take 1 capsule (30 mg total) by mouth 2 (two) times daily. 10/22/21   Carollee Herter, Alferd Apa, DO  Efinaconazole (JUBLIA) 10 % SOLN APPLY TO AFFECTED TOENAILS  ONCE DAILY FOR 48 WEEKS 03/09/22   Marzetta Board, DPM  Emollient (DERMEND BRUISE FORMULA) CREA as directed Externally 06/04/20   [provider]  estradiol (DOTTI) 0.1 MG/24HR patch Apply 1 patch twice a week by transdermal route. 08/18/21     ferric derisomaltose (MONOFERRIC) 1000 MG/10ML SOLN injection as directed Intravenous    [provider]  fluticasone (FLOVENT HFA) 110 MCG/ACT inhaler     [provider]  folic acid (FOLVITE) 1 MG tablet 1 tablet    [provider]  furosemide (LASIX) 20 MG tablet Take 1 tablet (20 mg total) by mouth every morning. 03/23/22   Carollee Herter, Alferd Apa, DO  Galcanezumab-gnlm (EMGALITY) 120 MG/ML SOAJ INJECT 120 MG INTO THE SKIN EVERY 28 (TWENTY-EIGHT) DAYS. 09/09/21 09/09/22  Metta Clines R, DO  glucose blood test strip Test blood sugar once daily. Dx Code: E11.9 01/09/19   Carollee Herter, Alferd Apa, DO  hydrALAZINE (APRESOLINE) 25 MG tablet Take 1 tablet (25 mg total) by mouth 2 (two) times daily. Hold if feeling dizzy or SBP<110 mmHg).  Can use additional 25 mg as needed for SBP>150 mmHg 03/31/22   Leonie Man, MD  HYDROcodone-acetaminophen (NORCO/VICODIN) 5-325 MG tablet TAKE 1 TABLET BY MOUTH EVERY 4-6 HOURS AS NEEDED FOR PAIN FOR 5 DAYS.    [provider]  hydroxychloroquine (PLAQUENIL) 200 MG tablet Take 2 tablets (400 mg total) by mouth daily. 01/18/22   Roma Schanz R, DO  influenza vaccine adjuvanted (FLUAD QUADRIVALENT) 0.5 ML injection Inject into the muscle. 03/30/22   Carlyle Basques, MD  Insulin Pen Needle (TECHLITE PEN NEEDLES) 32G X 6 MM MISC Use with Ozempic as directed 05/04/21   Carollee Herter, Alferd Apa, DO  liraglutide (VICTOZA) 18 MG/3ML SOPN      [provider]  loratadine (CLARITIN) 10 MG tablet 10 mg by oral route.    [provider]  methocarbamol (ROBAXIN) 500 MG tablet Take 1 tablet (500 mg total) by mouth 4 (four) times daily as needed for muscle spasms. 10/13/21     Multiple Vitamins-Minerals (CENTRUM ADULTS) TABS take 2 tablets by oral route  every day with food    [provider]  olmesartan (BENICAR) 20 MG tablet Take 1 tablet (20 mg total) by mouth daily with supper. 12/23/21   Leonie Man, MD  pantoprazole (PROTONIX) 40 MG tablet Take 1 tablet (40 mg total) by mouth daily. 10/22/21   [name], DO  potassium chloride (KLOR-CON M) 10 MEQ tablet Take 1 tablet (10 mEq total) by mouth daily. 03/23/22   Roma Schanz R, DO  rosuvastatin (CRESTOR) 20 MG tablet TAKE 1 TABLET BY MOUTH ONCE DAILY 10/22/21   Carollee Herter, Alferd Apa, DO  Semaglutide, 2 MG/DOSE, (OZEMPIC, 2 MG/DOSE,) 8 MG/3ML SOPN Inject 2 mg as directed once a week. 02/19/22   Roma Schanz R, DO  temazepam (RESTORIL) 30 MG capsule Take 1 capsule (30 mg total) by mouth at bedtime as needed for sleep. 03/16/22   [name], DO  thiamine (VITAMIN B-1) 100 MG tablet Take 100 mg daily by mouth.    [provider]  Ubrogepant (UBRELVY) 100 MG TABS TAKE ONE TABLET BY MOUTH DAILY AS NEEDED FOR MIGRAINE. MAX 2 TABLETS IN 24 HOURS 12/21/21   Pieter Partridge, DO    Family History Family History  Problem Relation Age of Onset   Diabetes Mother    Stroke Mother    Breast cancer  Mother    Atrial fibrillation Mother    Hypertension Mother    Cancer Mother 35       breast   Diabetes Father    Stroke Father 27       2nd 71 month apart   Hypertension Father    Heart attack Father 71       stents   Dementia Father    AAA (abdominal aortic aneurysm) Father 40       repair   CAD Father 59   Thyroid disease Other    Heart disease Other    COPD Other     Social History Social History   Tobacco Use   Smoking  status: Never   Smokeless tobacco: Never  Vaping Use   Vaping Use: Never used  Substance Use Topics   Alcohol use: Not Currently   Drug use: Never     Allergies   Gabapentin, Isoniazid, Morphine, Nitrofurantoin, Tamiflu [oseltamivir phosphate], Hctz [hydrochlorothiazide], Macrolides and ketolides, Promethazine hcl, Roxicet [oxycodone-acetaminophen], Wound dressing adhesive, Fentanyl, and Sulfamethoxazole-trimethoprim   Review of Systems Review of Systems  See HPI Physical Exam Triage Vital Signs ED Triage Vitals  Enc Vitals Group     BP 04/06/22 0833 (!) 153/71     Pulse Rate 04/06/22 0833 74     Resp 04/06/22 0833 16     Temp 04/06/22 0833 98.9 F (37.2 C)     Temp Source 04/06/22 0833 Oral     SpO2 04/06/22 0833 98 %     Weight --      Height --      Head Circumference --      Peak Flow --      Pain Score 04/06/22 0829 2     Pain Loc --      Pain Edu? --      Excl. in [place]? --    No data found.  Updated Vital Signs BP (!) 153/71 (BP Location: Left Arm)   Pulse 74   Temp 98.9 F (37.2 C) (Oral)   Resp 16   SpO2 98%      Physical Exam Constitutional:      General: She is not in acute distress.    Appearance: She is well-developed.     Comments: Overweight.  Pleasant.  Appears ill  HENT:     Head: Normocephalic and atraumatic.     Right Ear: Tympanic membrane and ear canal normal.     Left Ear: Tympanic membrane and ear canal normal.     Nose: No congestion or rhinorrhea.     Mouth/Throat:     Mouth: Mucous membranes are dry.     Pharynx: No posterior oropharyngeal erythema.  Eyes:     Conjunctiva/sclera: Conjunctivae normal.     Pupils: Pupils are equal, round, and reactive to light.  Cardiovascular:     Rate and Rhythm: Normal rate and regular rhythm.     Heart sounds: Murmur heard.  Pulmonary:     Effort: Pulmonary effort is normal. No respiratory distress.     Breath sounds: Rhonchi present.  Abdominal:     General: There is no distension.      Palpations: Abdomen is soft.  Musculoskeletal:        General: Normal range of motion.     Cervical back: Normal range of motion.  Lymphadenopathy:     Cervical: No cervical adenopathy.  Skin:    General: Skin is warm and dry.  Neurological:     Mental  Status: She is alert.  Psychiatric:        Mood and Affect: Mood normal.        Behavior: Behavior normal.      UC Treatments / Results  Labs (all labs ordered are listed, but only abnormal results are displayed) Labs Reviewed - No data to display  EKG   Radiology No results found.  Procedures Procedures (including critical care time)  Medications Ordered in UC Medications - No data to display  Initial Impression / Assessment and Plan / UC Course  I have reviewed the triage vital signs and the nursing notes.  Pertinent labs & imaging results that were available during my care of the patient were reviewed by me and considered in my medical decision making (see chart for details).     Explained to patient that most of the coughs and colds are caused by viruses.  Because of her immunocompromise and history of bronchitis we will cover her with an antibiotic.  Discussed I do not see a need for prednisone at this time secondary to fairly clear lung exam.  Low up with primary care Final Clinical Impressions(s) / UC Diagnoses   Final diagnoses:  Acute bronchitis, unspecified organism     Discharge Instructions      Continue to drink lots of fluids Take Mucinex or equivalent Use inhalers as needed Take amoxicillin 2 times a day for 7 days Follow-up with your primary care doctor Repeat COVID test if your symptoms fail to improve   ED Prescriptions     Medication Sig Dispense Auth. Provider   amoxicillin (AMOXIL) 875 MG tablet Take 1 tablet (875 mg total) by mouth 2 (two) times daily. 14 tablet Raylene Everts, MD      PDMP not reviewed this encounter.   Raylene Everts, MD 04/06/22 [phone]

## 2022-04-07 ENCOUNTER — Telehealth: Payer: Self-pay

## 2022-04-07 NOTE — Telephone Encounter (Signed)
TC to f/u after yesterday's visit to [facility]. No answer; left VM to call [phone] for problems or questions. 

## 2022-04-08 ENCOUNTER — Other Ambulatory Visit (HOSPITAL_BASED_OUTPATIENT_CLINIC_OR_DEPARTMENT_OTHER): Payer: Self-pay

## 2022-04-12 ENCOUNTER — Other Ambulatory Visit (HOSPITAL_BASED_OUTPATIENT_CLINIC_OR_DEPARTMENT_OTHER): Payer: Self-pay

## 2022-04-15 ENCOUNTER — Other Ambulatory Visit (HOSPITAL_BASED_OUTPATIENT_CLINIC_OR_DEPARTMENT_OTHER): Payer: Self-pay

## 2022-04-16 ENCOUNTER — Ambulatory Visit
Admission: RE | Admit: 2022-04-16 | Discharge: 2022-04-16 | Disposition: A | Discharge status: Home / Self Care | Payer: 59 | Source: Ambulatory Visit | Attending: Family Medicine | Admitting: Family Medicine

## 2022-04-16 ENCOUNTER — Ambulatory Visit (INDEPENDENT_AMBULATORY_CARE_PROVIDER_SITE_OTHER): Payer: 59

## 2022-04-16 VITALS — BP 131/73 | HR 79 | Temp 98.4°F | Resp 18

## 2022-04-16 DIAGNOSIS — M79671 Pain in right foot: Secondary | ICD-10-CM | POA: Diagnosis not present

## 2022-04-16 DIAGNOSIS — M7989 Other specified soft tissue disorders: Secondary | ICD-10-CM | POA: Diagnosis not present

## 2022-04-16 DIAGNOSIS — M25474 Effusion, right foot: Secondary | ICD-10-CM

## 2022-04-16 NOTE — ED Triage Notes (Signed)
Pt c/o RT foot pain and swelling x 1 week. No known injury. Hx of gout. Pain 6/10 Elevated prn.

## 2022-04-16 NOTE — Discharge Instructions (Addendum)
Advised/informed patient of right foot x-ray results with hard copy provided to patient.  Advised patient to RICE affected area of right foot for 30 minutes 3 times daily for the next 3 days.  Advised if symptoms worsen and/or unresolved please follow-up with [facility] orthopedic provider for further evaluation.  Contact information is below.

## 2022-04-16 NOTE — ED Provider Notes (Signed)
[name]    CSN: [id number] Arrival date & time: 04/16/22  1117      History   Chief Complaint Chief Complaint  Patient presents with   Foot Pain    Right foot swollen     HPI [name] is a 66 y.o. female.   HPI Very pleasant 66 year old female presents with right foot pain x1 week.  Patient denies injury or insult. PMH significant for obesity, fibromyalgia, osteoarthritis, and rheumatoid arthritis. Patient is currently followed by [facility] for fibromyalgia, myofascial pain, polyarthralgia, pain syndrome.  Past Medical History:  Diagnosis Date   Allergic rhinitis    Anxiety    Asthma    hx bronchial asthma at times with upper resp infection   Depression    Fibromyalgia    GERD (gastroesophageal reflux disease)    Headache(784.0)    migraine and cluster headaches   Hyperlipemia    Hypertension    IBS (irritable bowel syndrome)    Menopause    Neuromuscular disorder ([place]) 2009   hx of fibromyalgia, polyarthralsia (surgery induced)   Osteoarthritis    Rheumatoid arthritis ([place])    Complicated by osteoarthritis as well.   Rheumatoid arthritis ([place])    Stress incontinence    at times   Tibial fracture 10/14/2016   evulsion periostial right   Type 2 diabetes mellitus with complication, without long-term current use of insulin (HCC)    diet controlled no meds -> however was diagnosed with a diabetic foot ulcer.    Patient Active Problem List   Diagnosis Date Noted   Preventative health care 10/22/2021   Colon cancer screening 08/05/2021   Diabetic ulcer of toe of right foot associated with type 2 diabetes mellitus, limited to breakdown of skin ([place]) 06/19/2021   Tinea corporis 06/19/2021   Dysuria 06/19/2021   IDA (iron deficiency anemia) 05/18/2021   Pre-operative clearance 04/21/2021   Controlled type 2 diabetes mellitus with hyperglycemia, without long-term current use of insulin ([place]) 04/21/2021   Spinal stenosis of  lumbar region 04/21/2021   Bronchitis due to COVID-19 virus 03/27/2021   Orthostatic hypotension 01/09/2021   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   Spondylosis of cervical spine 10/07/2020   Lumbar radiculopathy 10/07/2020   Anemia 09/11/2020   Intractable migraine 08/26/2020   Inflammatory arthritis 07/22/2020   Thrombocytopenia ([place]) 07/22/2020   Chronic nonintractable headache 07/21/2020   Syncope 07/21/2020   Dizziness 07/21/2020   Cellulitis of right hand 07/21/2020   Lumbar spondylolysis 05/13/2020   Primary hypertension 04/02/2020   Body mass index (BMI) 29.0-29.9, adult 04/02/2020   Menopausal symptom 03/11/2020   Stress 03/11/2020   Unspecified dyspareunia (CODE) 03/11/2020   Bilateral sacroiliitis ([place]) 03/04/2020   Senile calcific aortic valve sclerosis 02/08/2020   DOE (dyspnea on exertion) 02/08/2020   Murmur 01/07/2020   Acquired trigger finger of right ring finger 10/25/2019   Pain in right knee 08/24/2019   Rheumatoid arthritis involving multiple sites with positive rheumatoid factor ([place]) 08/02/2019   Primary osteoarthritis of both knees 08/02/2019   PTSD (post-traumatic stress disorder) 02/08/2019   Dog bite of arm, right, initial encounter 10/05/2018   Hyperlipidemia associated with type 2 diabetes mellitus ([place]) 08/24/2018   Symptomatic abdominal panniculus 12/12/2017   Carpal tunnel syndrome of left wrist 11/28/2017   Pain in right hand 11/08/2017   Cervical radiculopathy 10/06/2017   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   Keratosis 12/04/2014   Onychocryptosis 08/21/2014   Onychomycosis 05/21/2014   Fissured  skin 01/22/2014   Fissure in skin of foot 12/18/2013   Porokeratosis 12/18/2013   Pain in lower limb 11/21/2013   Ingrown nail 07/20/2013   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 07/02/2013   Obesity (BMI 30-39.9) 06/25/2013   GERD (gastroesophageal reflux disease) 08/20/2011   Lapband APL + HH repair June 2011-Major  revision Dec 2013 06/29/2011   ABDOMINAL PAIN, ACUTE 08/01/2010   VITAMIN B12 DEFICIENCY 04/01/2010   BARIATRIC SURGERY STATUS 01/29/2010   ONYCHOMYCOSIS, BILATERAL 06/30/2009   STRESS INCONTINENCE 06/10/2009   ACUTE PHARYNGITIS 04/29/2009   COUGH 04/27/2009   BRONCHITIS, ACUTE 04/16/2009   NAUSEA 04/08/2009   DIARRHEA 04/08/2009   MORBID OBESITY 03/03/2009   SKIN RASH 11/18/2008   UNSPECIFIED VITAMIN D DEFICIENCY 07/15/2008   OTHER SPECIFIED ANEMIAS 07/15/2008   Pain in joint, multiple sites 06/13/2008   Myalgia and myositis, unspecified 06/13/2008   BACK PAIN, LUMBAR 02/14/2008   ACUTE SINUSITIS, UNSPECIFIED 09/01/2007   CELLULITIS 08/12/2007   Cellulitis 08/12/2007   Depression with anxiety 06/09/2007   DEPRESSION 06/09/2007   Controlled type 2 diabetes mellitus with diabetic neuropathy, without long-term current use of insulin ([place]) 01/04/2007   Labile hypertension 01/04/2007   ALLERGIC RHINITIS 01/04/2007   HEADACHE 01/04/2007    Past Surgical History:  Procedure Laterality Date   ABDOMINAL HYSTERECTOMY  12/1999   complete   CARPAL TUNNEL RELEASE Right 12/27/2017   Procedure: CARPAL TUNNEL RELEASE;  Surgeon: Roseanne Kaufman, MD;  Location: [facility];  Service: Orthopedics;  Laterality: Right;   CERVICAL CONIZATION W/BX  june 1990   dysplasia of cervix/used 5Fu cream for 3 months   CERVICAL LAMINECTOMY  2005 & 2009   X 2   NO ROM [place]   X 2   CHOLECYSTECTOMY  1986   DILATION AND CURETTAGE OF UTERUS   [phone]   missed abortion   ESOPHAGOGASTRODUODENOSCOPY  06/29/2011   Procedure: ESOPHAGOGASTRODUODENOSCOPY (EGD);  Surgeon: Shann Medal, MD;  [facility];  Service: General;  Laterality: N/A;   FOOT SURGERY  2005   RT HEEL   GASTRIC BANDING PORT REVISION  09/24/2011   Procedure: GASTRIC BANDING PORT REVISION;  Surgeon: Pedro Earls, MD;  [facility];  Service: General;  Laterality: N/A;   GASTRIC ROUX-EN-Y N/A  06/06/2017   Procedure: Conversion from [place]leeve to [place];  Surgeon: Johnathan Hausen, MD;  [facility];  Service: General;  Laterality: N/A;   [place]  12/30/09   LAPAROSCOPIC GASTRIC SLEEVE RESECTION N/A 07/02/2013   Procedure: LAPAROSCOPIC GASTRIC SLEEVE RESECTION upper endoscopy;  Surgeon: Pedro Earls, MD;  [facility];  Service: General;  Laterality: N/A;   LAPAROSCOPIC REPAIR AND REMOVAL OF GASTRIC BAND N/A 07/02/2013   Procedure: LAPAROSCOPIC REMOVAL OF GASTRIC BAND;  Surgeon: Pedro Earls, MD;  [facility];  Service: General;  Laterality: N/A;   LAPAROSCOPIC REVISION OF GASTRIC BAND  07/03/2012   Procedure: LAPAROSCOPIC REVISION OF GASTRIC BAND;  Surgeon: Pedro Earls, MD;  [facility];  Service: General;  Laterality: N/A;  removal of old lap. band port, replaced with AP standard band   LAPAROSCOPY  09/24/2011   Procedure: LAPAROSCOPY DIAGNOSTIC;  Surgeon: Pedro Earls, MD;  [facility];  Service: General;  Laterality: N/A;   LAPAROSCOPY  07/03/2012   Procedure: LAPAROSCOPY DIAGNOSTIC;  Surgeon: Pedro Earls, MD;  [facility];  Service: General;  Laterality: N/A;  Exploratory Laparoscopy    MESH APPLIED TO LAP PORT  07/03/2012   Procedure: MESH APPLIED TO LAP PORT;  Surgeon: Pedro Earls, MD;  [facility];  Service: General;  Laterality: N/A;   [place]   X 2   right knee  1981   ARTHROSCOPY AND ARTHROTOMY   right knee arthroscopy and arthrotomy  12-1979   TONSILLECTOMY  1971   TUBAL LIGATION  1993   WITH C -SECTION    OB History     Gravida  3   Para  0   Term  0   Preterm  0   AB  1   Living  2      SAB  1   IAB  0   Ectopic  0   Multiple  0   Live Births               Home Medications    Prior to Admission medications   Medication Sig Start Date End Date Taking? Authorizing  Provider  Abatacept (ORENCIA CLICKJECT) 277 MG/ML SOAJ     [provider]  acetaminophen (TYLENOL) 650 MG CR tablet Take by mouth.    [provider]  albuterol (VENTOLIN HFA) 108 (90 Base) MCG/ACT inhaler  04/21/21   [provider]  ALPRAZolam Duanne Moron) 0.25 MG tablet     [provider]  amoxicillin (AMOXIL) 875 MG tablet Take 1 tablet (875 mg total) by mouth 2 (two) times daily. 04/06/22   Raylene Everts, MD  Ca Phosphate-Cholecalciferol (CALTRATE GUMMY BITES PO) Take 2 each 2 (two) times daily by mouth.     [provider]  calcitonin, salmon, (MIACALCIN/FORTICAL) 200 UNIT/ACT nasal spray Instill 1 spray by nasal route every day, alternating nostrils. 07/09/21     Cholecalciferol 125 MCG (5000 UT) TABS take 1 tablet by oral route every day    [provider]  conjugated estrogens (PREMARIN) vaginal cream INSERT 1 GRAM of cream per vagina THREE TIMES A WEEK BY VAGINAL ROUTE. 08/18/21     Cyanocobalamin 3000 MCG/ML LIQD as directed Sublingual    [provider]  cyclobenzaprine (FLEXERIL) 10 MG tablet Take 1 tablet (10 mg total) by mouth before bedtime for muscle spasms . 10/13/21     cyclobenzaprine (FLEXERIL) 10 MG tablet Take 1 tablet by mouth at bedtime as needed.    [provider]  diclofenac Sodium (VOLTAREN) 1 % GEL Apply 4 g topically 4 (four) times daily.    [provider]  DULoxetine (CYMBALTA) 30 MG capsule Take 1 capsule (30 mg total) by mouth 2 (two) times daily. 10/22/21   Carollee Herter, Alferd Apa, DO  Efinaconazole (JUBLIA) 10 % SOLN APPLY TO AFFECTED TOENAILS ONCE DAILY FOR 48 WEEKS 03/09/22   [place], Stephani Police, DPM  Emollient (DERMEND BRUISE FORMULA) CREA as directed Externally 06/04/20   [provider]  estradiol (DOTTI) 0.1 MG/24HR patch Apply 1 patch twice a week by transdermal route. 08/18/21     ferric derisomaltose (MONOFERRIC) 1000 MG/10ML SOLN injection as directed Intravenous     [provider]  fluticasone (FLOVENT HFA) 110 MCG/ACT inhaler     [provider]  folic acid (FOLVITE) 1 MG tablet 1 tablet    [provider]  furosemide (LASIX) 20 MG tablet Take 1 tablet (20 mg total) by mouth every morning. 03/23/22   [name], DO  Galcanezumab-gnlm (EMGALITY) 120 MG/ML SOAJ INJECT 120 MG INTO THE SKIN EVERY 28 (TWENTY-EIGHT) DAYS. 09/09/21 09/09/22  Metta Clines R, DO  glucose blood test strip Test blood sugar once daily. Dx Code: E11.9 01/09/19   Carollee Herter, Alferd Apa, DO  hydrALAZINE (APRESOLINE) 25 MG tablet Take 1 tablet (25 mg total) by mouth 2 (two) times daily. Hold if feeling dizzy or SBP<110 mmHg).  Can use additional 25 mg as needed for SBP>150 mmHg 03/31/22   Leonie Man, MD  HYDROcodone-acetaminophen (NORCO/VICODIN) 5-325 MG tablet TAKE 1 TABLET BY MOUTH EVERY 4-6 HOURS AS NEEDED FOR PAIN FOR 5 DAYS.    [provider]  hydroxychloroquine (PLAQUENIL) 200 MG tablet Take 2 tablets (400 mg total) by mouth daily. 01/18/22   Roma Schanz R, DO  influenza vaccine adjuvanted (FLUAD QUADRIVALENT) 0.5 ML injection Inject into the muscle. 03/30/22   Carlyle Basques, MD  Insulin Pen Needle (TECHLITE PEN NEEDLES) 32G X 6 MM MISC Use with Ozempic as directed 05/04/21   Carollee Herter, Alferd Apa, DO  liraglutide (VICTOZA) 18 MG/3ML SOPN     [provider]  loratadine (CLARITIN) 10 MG tablet 10 mg by oral route.    [provider]  methocarbamol (ROBAXIN) 500 MG tablet Take 1 tablet (500 mg total) by mouth 4 (four) times daily as needed for muscle spasms. 10/13/21     Multiple Vitamins-Minerals (CENTRUM ADULTS) TABS take 2 tablets by oral route  every day with food    [provider]  olmesartan (BENICAR) 20 MG tablet Take 1 tablet (20 mg total) by mouth daily with supper. 12/23/21   Leonie Man, MD  pantoprazole (PROTONIX) 40 MG tablet Take 1 tablet (40 mg total) by mouth daily. 10/22/21   [name], DO  potassium chloride (KLOR-CON M) 10 MEQ tablet Take 1 tablet (10 mEq total) by mouth daily. 03/23/22   Roma Schanz R, DO  rosuvastatin (CRESTOR) 20 MG tablet TAKE 1 TABLET BY MOUTH ONCE DAILY 10/22/21   Carollee Herter, Alferd Apa, DO  Semaglutide, 2 MG/DOSE, (OZEMPIC, 2 MG/DOSE,) 8 MG/3ML SOPN Inject 2 mg as directed once a week. 02/19/22   Roma Schanz R, DO  temazepam (RESTORIL) 30 MG capsule Take 1 capsule (30 mg total) by mouth at bedtime as needed for sleep. 03/16/22   [name], DO  thiamine (VITAMIN B-1) 100 MG tablet Take 100 mg daily by mouth.    [provider]  Ubrogepant (UBRELVY) 100 MG TABS TAKE ONE TABLET BY MOUTH DAILY AS NEEDED FOR MIGRAINE. MAX 2 TABLETS IN 24 HOURS 12/21/21   Pieter Partridge, DO    Family History Family History  Problem Relation Age of Onset   Diabetes Mother    Stroke Mother    Breast cancer Mother    Atrial fibrillation Mother    Hypertension Mother    Cancer Mother 32       breast   Diabetes Father    Stroke Father 37       2nd 19 month apart   Hypertension Father    Heart attack Father 6       stents   Dementia Father    AAA (abdominal aortic aneurysm) Father 31       repair   CAD Father 19   Thyroid disease Other    Heart disease Other    COPD Other     Social History Social History   Tobacco Use  Smoking status: Never   Smokeless tobacco: Never  Vaping Use   Vaping Use: Never used  Substance Use Topics   Alcohol use: Not Currently   Drug use: Never     Allergies   Gabapentin, Isoniazid, Morphine, Nitrofurantoin, Tamiflu [oseltamivir phosphate], Hctz [hydrochlorothiazide], Macrolides and ketolides, Promethazine hcl, Roxicet [oxycodone-acetaminophen], Wound dressing adhesive, Fentanyl, and Sulfamethoxazole-trimethoprim   Review of Systems Review of Systems  Musculoskeletal:        Right foot pain x 1 week     Physical Exam Triage Vital Signs ED Triage Vitals  Enc Vitals Group      BP 04/16/22 1130 131/73     Pulse Rate 04/16/22 1130 79     Resp 04/16/22 1130 18     Temp 04/16/22 1130 98.4 F (36.9 C)     Temp Source 04/16/22 1130 Oral     SpO2 04/16/22 1130 99 %     Weight --      Height --      Head Circumference --      Peak Flow --      Pain Score 04/16/22 1127 6     Pain Loc --      Pain Edu? --      Excl. in [place]? --    No data found.  Updated Vital Signs BP 131/73 (BP Location: Right Arm)   Pulse 79   Temp 98.4 F (36.9 C) (Oral)   Resp 18   SpO2 99%      Physical Exam Vitals and nursing note reviewed.  Constitutional:      General: She is not in acute distress.    Appearance: Normal appearance. She is obese. She is not ill-appearing.  HENT:     Head: Normocephalic and atraumatic.     Mouth/Throat:     Mouth: Mucous membranes are moist.     Pharynx: Oropharynx is clear.  Eyes:     Extraocular Movements: Extraocular movements intact.     Conjunctiva/sclera: Conjunctivae normal.     Pupils: Pupils are equal, round, and reactive to light.  Cardiovascular:     Rate and Rhythm: Normal rate and regular rhythm.     Pulses: Normal pulses.     Heart sounds: Normal heart sounds. No murmur heard. Pulmonary:     Effort: Pulmonary effort is normal.     Breath sounds: Normal breath sounds. No wheezing, rhonchi or rales.  Musculoskeletal:        General: Normal range of motion.     Cervical back: Normal range of motion and neck supple.     Comments: Right foot pain (dorsum, superior aspect): TTP with moderate soft tissue swelling noted  Skin:    General: Skin is warm and dry.  Neurological:     General: No focal deficit present.     Mental Status: She is alert and oriented to person, place, and time. Mental status is at baseline.      UC Treatments / Results  Labs (all labs ordered are listed, but only abnormal results are displayed) Labs Reviewed - No data to display  EKG   Radiology DG Foot Complete Right  Result Date:  04/16/2022 CLINICAL DATA:  Pain and swelling for 1 week, no known injury. EXAM: RIGHT FOOT COMPLETE - 3 VIEW COMPARISON:  None Available. FINDINGS: Diffuse osseous demineralization. There is no evidence of fracture or dislocation. The joint spaces are well-preserved. Moderate plantar calcaneal enthesophyte. Mineralization of the distal Achilles tendon. No joint effusion. IMPRESSION: No acute  osseous abnormality. Electronically Signed   By: Beryle Flock M.D.   On: 04/16/2022 11:55    Procedures Procedures (including critical care time)  Medications Ordered in UC Medications - No data to display  Initial Impression / Assessment and Plan / UC Course  I have reviewed the triage vital signs and the nursing notes.  Pertinent labs & imaging results that were available during my care of the patient were reviewed by me and considered in my medical decision making (see chart for details).     MDM: 1.  Right foot pain-right foot x-ray reveals above, Advised/informed patient of right foot x-ray results with hard copy provided to patient.  Advised patient to RICE affected area of right foot for 30 minutes 3 times daily for the next 3 days.  Advised if symptoms worsen and/or unresolved please follow-up with [facility] orthopedic provider for further evaluation.  Contact information is below.  Patient discharged home, hemodynamically stable. Final Clinical Impressions(s) / UC Diagnoses   Final diagnoses:  Foot pain, right     Discharge Instructions      Advised/informed patient of right foot x-ray results with hard copy provided to patient.  Advised patient to RICE affected area of right foot for 30 minutes 3 times daily for the next 3 days.  Advised if symptoms worsen and/or unresolved please follow-up with [facility] orthopedic provider for further evaluation.  Contact information is below.     ED Prescriptions   None    PDMP not reviewed this encounter.   Eliezer Lofts, [facility] 04/16/22  1233

## 2022-04-17 ENCOUNTER — Telehealth: Payer: Self-pay

## 2022-04-17 NOTE — Telephone Encounter (Signed)
Pt states foot is feeling about the same. Is continuing the rice method. Is going to call Dr Raeford Razor on Monday. Advised to call if any questions or concerns in the meantime.

## 2022-04-19 ENCOUNTER — Other Ambulatory Visit (HOSPITAL_BASED_OUTPATIENT_CLINIC_OR_DEPARTMENT_OTHER): Payer: Self-pay

## 2022-04-19 ENCOUNTER — Encounter: Payer: Self-pay | Admitting: Family Medicine

## 2022-04-19 ENCOUNTER — Encounter: Payer: Self-pay | Admitting: Family

## 2022-04-19 ENCOUNTER — Ambulatory Visit: Payer: Self-pay

## 2022-04-19 ENCOUNTER — Ambulatory Visit (INDEPENDENT_AMBULATORY_CARE_PROVIDER_SITE_OTHER): Payer: 59 | Admitting: Family Medicine

## 2022-04-19 VITALS — BP 138/64 | Ht 71.0 in | Wt 201.0 lb

## 2022-04-19 DIAGNOSIS — M79671 Pain in right foot: Secondary | ICD-10-CM

## 2022-04-19 DIAGNOSIS — M659 Synovitis and tenosynovitis, unspecified: Secondary | ICD-10-CM

## 2022-04-19 MED ORDER — PREDNISONE 5 MG PO TABS
ORAL_TABLET | ORAL | 0 refills | Status: AC
  Filled 2022-04-19: qty 21, 6d supply, fill #0

## 2022-04-19 MED ORDER — PREDNISONE 5 MG PO TABS: ORAL_TABLET | ORAL | 0 refills | Status: DC

## 2022-04-19 NOTE — Assessment & Plan Note (Signed)
Acutely occurring.  Symptoms seem more consistent with an inflammatory source as she does have a history of rheumatoid arthritis. -Counseled on home exercise therapy and supportive care. -Prednisone. -Post op shoe -Could consider further imaging or lab work.

## 2022-04-19 NOTE — Patient Instructions (Signed)
Nice to meet you Please try the post op shoe  Please continue to elevate   Please send me a message in MyChart with any questions or updates.  Please see me back in 2 weeks.   --Dr. Raeford Razor

## 2022-04-19 NOTE — Progress Notes (Signed)
[name] - 66 y.o. female MRN [id number]  Date of birth: Aug 30, 1955  SUBJECTIVE:  Including CC & ROS.  No chief complaint on file.   [name] is a 66 y.o. female that is presenting with acute right foot pain.  Having redness and swelling over the third and fourth metatarsal.  May have had a mechanism injury.  Does have a history of rheumatoid arthritis..  Review of the urgent care note from 10/6 so she was counseled on supportive care. Independent review of the right foot x-ray from 10/6 shows calcification in the achilles tendon.   Review of Systems See HPI   HISTORY: Past Medical, Surgical, Social, and Family History Reviewed & Updated per EMR.   Pertinent Historical Findings include:  Past Medical History:  Diagnosis Date   Allergic rhinitis    Anxiety    Asthma    hx bronchial asthma at times with upper resp infection   Depression    Fibromyalgia    GERD (gastroesophageal reflux disease)    Headache(784.0)    migraine and cluster headaches   Hyperlipemia    Hypertension    IBS (irritable bowel syndrome)    Menopause    Neuromuscular disorder ([place]) 2009   hx of fibromyalgia, polyarthralsia (surgery induced)   Osteoarthritis    Rheumatoid arthritis ([place])    Complicated by osteoarthritis as well.   Rheumatoid arthritis ([place])    Stress incontinence    at times   Tibial fracture 10/14/2016   evulsion periostial right   Type 2 diabetes mellitus with complication, without long-term current use of insulin (HCC)    diet controlled no meds -> however was diagnosed with a diabetic foot ulcer.    Past Surgical History:  Procedure Laterality Date   ABDOMINAL HYSTERECTOMY  12/1999   complete   CARPAL TUNNEL RELEASE Right 12/27/2017   Procedure: CARPAL TUNNEL RELEASE;  Surgeon: Roseanne Kaufman, MD;  Location: [facility];  Service: Orthopedics;  Laterality: Right;   CERVICAL CONIZATION W/BX  june 1990   dysplasia of cervix/used 5Fu cream for 3 months   CERVICAL  LAMINECTOMY  2005 & 2009   X 2   NO ROM [place]   X 2   CHOLECYSTECTOMY  1986   DILATION AND CURETTAGE OF UTERUS   [phone]   missed abortion   ESOPHAGOGASTRODUODENOSCOPY  06/29/2011   Procedure: ESOPHAGOGASTRODUODENOSCOPY (EGD);  Surgeon: Shann Medal, MD;  [facility];  Service: General;  Laterality: N/A;   FOOT SURGERY  2005   RT HEEL   GASTRIC BANDING PORT REVISION  09/24/2011   Procedure: GASTRIC BANDING PORT REVISION;  Surgeon: Pedro Earls, MD;  [facility];  Service: General;  Laterality: N/A;   GASTRIC ROUX-EN-Y N/A 06/06/2017   Procedure: Conversion from [place]leeve to [place];  Surgeon: Johnathan Hausen, MD;  [facility];  Service: General;  Laterality: N/A;   [place]  12/30/09   LAPAROSCOPIC GASTRIC SLEEVE RESECTION N/A 07/02/2013   Procedure: LAPAROSCOPIC GASTRIC SLEEVE RESECTION upper endoscopy;  Surgeon: Pedro Earls, MD;  [facility];  Service: General;  Laterality: N/A;   LAPAROSCOPIC REPAIR AND REMOVAL OF GASTRIC BAND N/A 07/02/2013   Procedure: LAPAROSCOPIC REMOVAL OF GASTRIC BAND;  Surgeon: Pedro Earls, MD;  [facility];  Service: General;  Laterality: N/A;   LAPAROSCOPIC REVISION OF GASTRIC BAND  07/03/2012   Procedure:  LAPAROSCOPIC REVISION OF GASTRIC BAND;  Surgeon: Pedro Earls, MD;  [facility];  Service: General;  Laterality: N/A;  removal of old lap. band port, replaced with AP standard band   LAPAROSCOPY  09/24/2011   Procedure: LAPAROSCOPY DIAGNOSTIC;  Surgeon: Pedro Earls, MD;  [facility];  Service: General;  Laterality: N/A;   LAPAROSCOPY  07/03/2012   Procedure: LAPAROSCOPY DIAGNOSTIC;  Surgeon: Pedro Earls, MD;  [facility];  Service: General;  Laterality: N/A;  Exploratory Laparoscopy    MESH APPLIED TO LAP PORT  07/03/2012   Procedure: MESH APPLIED TO LAP PORT;   Surgeon: Pedro Earls, MD;  [facility];  Service: General;  Laterality: N/A;   [place]   X 2   right knee  1981   ARTHROSCOPY AND ARTHROTOMY   right knee arthroscopy and arthrotomy  12-1979   TONSILLECTOMY  1971   TUBAL LIGATION  1993   WITH C -SECTION     PHYSICAL EXAM:  VS: BP 138/64 (BP Location: Left Arm, Patient Position: Sitting)   Ht '5\' 11"'$  (1.803 m)   Wt 201 lb (91.2 kg)   BMI 28.03 kg/m  Physical Exam Gen: NAD, alert, cooperative with exam, well-appearing MSK:  Neurovascularly intact    Limited ultrasound: Right foot:  Effusion noted within the second, third and fourth MTP joint.  There is a surrounding hyperemia in these areas. There is significant soft tissue hyperemia. No significant cobblestoning. Degenerative changes appreciated between the base of the fourth metatarsal and cuboid No significant changes at the first MTP joint.  Summary: Findings are more consistent with a synovitis and inflammatory changes opposed to a fracture  Ultrasound and interpretation by Clearance Coots, MD    ASSESSMENT & PLAN:   Synovitis of toe Acutely occurring.  Symptoms seem more consistent with an inflammatory source as she does have a history of rheumatoid arthritis. -Counseled on home exercise therapy and supportive care. -Prednisone. -Post op shoe -Could consider further imaging or lab work.

## 2022-04-20 DIAGNOSIS — M4316 Spondylolisthesis, lumbar region: Secondary | ICD-10-CM | POA: Diagnosis not present

## 2022-04-20 DIAGNOSIS — M4326 Fusion of spine, lumbar region: Secondary | ICD-10-CM | POA: Diagnosis not present

## 2022-04-21 DIAGNOSIS — M0609 Rheumatoid arthritis without rheumatoid factor, multiple sites: Secondary | ICD-10-CM | POA: Diagnosis not present

## 2022-04-22 ENCOUNTER — Ambulatory Visit: Payer: 59 | Admitting: Family Medicine

## 2022-04-22 ENCOUNTER — Encounter: Payer: Self-pay | Admitting: Family Medicine

## 2022-04-22 ENCOUNTER — Encounter: Payer: Self-pay | Admitting: Family

## 2022-04-22 ENCOUNTER — Other Ambulatory Visit (HOSPITAL_BASED_OUTPATIENT_CLINIC_OR_DEPARTMENT_OTHER): Payer: Self-pay

## 2022-04-22 VITALS — BP 124/70 | HR 78 | Temp 98.4°F | Resp 18 | Ht 71.0 in | Wt 205.4 lb

## 2022-04-22 DIAGNOSIS — E118 Type 2 diabetes mellitus with unspecified complications: Secondary | ICD-10-CM | POA: Diagnosis not present

## 2022-04-22 DIAGNOSIS — K219 Gastro-esophageal reflux disease without esophagitis: Secondary | ICD-10-CM | POA: Diagnosis not present

## 2022-04-22 DIAGNOSIS — B354 Tinea corporis: Secondary | ICD-10-CM

## 2022-04-22 DIAGNOSIS — I10 Essential (primary) hypertension: Secondary | ICD-10-CM | POA: Diagnosis not present

## 2022-04-22 DIAGNOSIS — M0579 Rheumatoid arthritis with rheumatoid factor of multiple sites without organ or systems involvement: Secondary | ICD-10-CM | POA: Diagnosis not present

## 2022-04-22 DIAGNOSIS — F418 Other specified anxiety disorders: Secondary | ICD-10-CM | POA: Diagnosis not present

## 2022-04-22 DIAGNOSIS — G4709 Other insomnia: Secondary | ICD-10-CM | POA: Diagnosis not present

## 2022-04-22 DIAGNOSIS — E1169 Type 2 diabetes mellitus with other specified complication: Secondary | ICD-10-CM

## 2022-04-22 DIAGNOSIS — E1165 Type 2 diabetes mellitus with hyperglycemia: Secondary | ICD-10-CM

## 2022-04-22 DIAGNOSIS — E785 Hyperlipidemia, unspecified: Secondary | ICD-10-CM

## 2022-04-22 DIAGNOSIS — E1140 Type 2 diabetes mellitus with diabetic neuropathy, unspecified: Secondary | ICD-10-CM

## 2022-04-22 LAB — LIPID PANEL
Cholesterol: 119 mg/dL (ref 0–200)
HDL: 60.6 mg/dL (ref >39.00)
LDL Cholesterol: 45 mg/dL (ref 0–99)
NonHDL: 58.76
Total CHOL/HDL Ratio: 2
Triglycerides: 68 mg/dL (ref 0.0–149.0)
VLDL: 13.6 mg/dL (ref 0.0–40.0)

## 2022-04-22 LAB — HEMOGLOBIN A1C: Hgb A1c MFr Bld: 5.9 % (ref 4.6–6.5)

## 2022-04-22 MED ORDER — POTASSIUM CHLORIDE CRYS ER 10 MEQ PO TBCR
10.0000 meq | EXTENDED_RELEASE_TABLET | Freq: Every day | ORAL | 0 refills | Status: DC
  Filled 2022-04-22 – 2022-08-26 (×2): qty 90, 90d supply, fill #0

## 2022-04-22 MED ORDER — NYSTATIN 100000 UNIT/GM EX POWD
CUTANEOUS | 5 refills | Status: DC
  Filled 2022-04-22: qty 15, 5d supply, fill #0
  Filled 2022-05-07: qty 15, 5d supply, fill #1
  Filled 2022-06-23: qty 15, 5d supply, fill #2
  Filled 2022-07-06: qty 15, 5d supply, fill #3
  Filled 2022-07-22: qty 15, 5d supply, fill #4
  Filled 2022-08-02: qty 15, 5d supply, fill #5

## 2022-04-22 MED ORDER — OZEMPIC (2 MG/DOSE) 8 MG/3ML ~~LOC~~ SOPN
2.0000 mg | PEN_INJECTOR | SUBCUTANEOUS | 3 refills | Status: DC
  Filled 2022-04-22 – 2022-05-07 (×2): qty 3, 28d supply, fill #0
  Filled 2022-06-04: qty 3, 28d supply, fill #1
  Filled 2022-07-06: qty 3, 28d supply, fill #2
  Filled 2022-08-02: qty 3, 28d supply, fill #3

## 2022-04-22 MED ORDER — COVID-19 MRNA 2023-2024 VACCINE (COMIRNATY) 0.3 ML INJECTION
INTRAMUSCULAR | 0 refills | Status: DC
  Filled 2022-04-22: qty 0.3, 1d supply, fill #0

## 2022-04-22 MED ORDER — ROSUVASTATIN CALCIUM 20 MG PO TABS
20.0000 mg | ORAL_TABLET | Freq: Every day | ORAL | 1 refills | Status: DC
  Filled 2022-04-22 – 2022-05-07 (×2): qty 90, 90d supply, fill #0
  Filled 2022-07-31: qty 90, 90d supply, fill #1

## 2022-04-22 MED ORDER — FUROSEMIDE 20 MG PO TABS
20.0000 mg | ORAL_TABLET | Freq: Every morning | ORAL | 0 refills | Status: DC
  Filled 2022-04-22 – 2022-06-23 (×2): qty 90, 90d supply, fill #0

## 2022-04-22 MED ORDER — TEMAZEPAM 30 MG PO CAPS
30.0000 mg | ORAL_CAPSULE | Freq: Every evening | ORAL | 1 refills | Status: DC | PRN
  Filled 2022-04-22 – 2022-06-25 (×3): qty 90, 90d supply, fill #0
  Filled 2022-09-21: qty 90, 90d supply, fill #1

## 2022-04-22 MED ORDER — PANTOPRAZOLE SODIUM 40 MG PO TBEC
40.0000 mg | DELAYED_RELEASE_TABLET | Freq: Every day | ORAL | 3 refills | Status: DC
  Filled 2022-04-22 – 2022-07-13 (×2): qty 90, 90d supply, fill #0
  Filled 2022-10-11: qty 90, 90d supply, fill #1
  Filled 2023-01-06: qty 90, 90d supply, fill #2
  Filled 2023-04-06: qty 90, 90d supply, fill #3

## 2022-04-22 MED ORDER — DULOXETINE HCL 30 MG PO CPEP
30.0000 mg | ORAL_CAPSULE | Freq: Two times a day (BID) | ORAL | 1 refills | Status: DC
  Filled 2022-04-22 – 2022-07-13 (×2): qty 180, 90d supply, fill #0
  Filled 2022-10-11: qty 180, 90d supply, fill #1

## 2022-04-22 NOTE — Assessment & Plan Note (Signed)
Per rheum 

## 2022-04-22 NOTE — Assessment & Plan Note (Signed)
hgba1c to be checked, minimize simple carbs. Increase exercise as tolerated. Continue current meds  

## 2022-04-22 NOTE — Assessment & Plan Note (Signed)
Well controlled, no changes to meds. Encouraged heart healthy diet such as the DASH diet and exercise as tolerated.  °

## 2022-04-22 NOTE — Progress Notes (Addendum)
Subjective:   By signing my name below, I, [name], attest that this documentation has been prepared under the direction and in the presence of [name], DO. 04/22/2022    Patient ID: [name], female    DOB: Feb 22, 1956, 66 y.o.   MRN: [id number]  Chief Complaint  Patient presents with   Hypertension   Hyperlipidemia   Diabetes   Follow-up    Hypertension Pertinent negatives include no blurred vision, chest pain, headaches, malaise/fatigue, palpitations or shortness of breath.  Hyperlipidemia Pertinent negatives include no chest pain or shortness of breath.  Diabetes Pertinent negatives for hypoglycemia include no dizziness, headaches or nervousness/anxiousness. Pertinent negatives for diabetes include no blurred vision and no chest pain.   Patient is in today for a follow up visit.   She is requesting a refill for 20 mg lasix, 30 mg duloxetine, 40 mg Protonix, 2 mg ozempic, 20 mg Crestor.  She reports twisting her foot and found the next day it was swollen. She then seen a sports medicine specialist and completed an ultra sound and found there was fluid in between her joints. She was put on prednisone to manage her swelling.  Her liver enzymes were elevated during her 2nd to last lab work. She denies using tylenol and does not drink alcohol. Her liver enzymes had improved during her last lab work. She denies having abdominal pain.  She is interested in receiving the RSV vaccine at her pharmacy.  She reports she has not had a colonoscopy in the past 10 years and is interested in scheduling an appointment.    Past Medical History:  Diagnosis Date   Allergic rhinitis    Anxiety    Asthma    hx bronchial asthma at times with upper resp infection   Depression    Fibromyalgia    GERD (gastroesophageal reflux disease)    Headache(784.0)    migraine and cluster headaches   Hyperlipemia    Hypertension    IBS (irritable bowel syndrome)    Menopause     Neuromuscular disorder ([place]) 2009   hx of fibromyalgia, polyarthralsia (surgery induced)   Osteoarthritis    Rheumatoid arthritis ([place])    Complicated by osteoarthritis as well.   Rheumatoid arthritis ([place])    Stress incontinence    at times   Tibial fracture 10/14/2016   evulsion periostial right   Type 2 diabetes mellitus with complication, without long-term current use of insulin (HCC)    diet controlled no meds -> however was diagnosed with a diabetic foot ulcer.    Past Surgical History:  Procedure Laterality Date   ABDOMINAL HYSTERECTOMY  12/1999   complete   CARPAL TUNNEL RELEASE Right 12/27/2017   Procedure: CARPAL TUNNEL RELEASE;  Surgeon: Roseanne Kaufman, MD;  Location: [facility];  Service: Orthopedics;  Laterality: Right;   CERVICAL CONIZATION W/BX  june 1990   dysplasia of cervix/used 5Fu cream for 3 months   CERVICAL LAMINECTOMY  2005 & 2009   X 2   NO ROM [place]   X 2   CHOLECYSTECTOMY  1986   DILATION AND CURETTAGE OF UTERUS   [phone]   missed abortion   ESOPHAGOGASTRODUODENOSCOPY  06/29/2011   Procedure: ESOPHAGOGASTRODUODENOSCOPY (EGD);  Surgeon: Shann Medal, MD;  Location: [facility];  Service: General;  Laterality: N/A;   FOOT SURGERY  2005   RT HEEL   GASTRIC BANDING PORT REVISION  09/24/2011   Procedure:  GASTRIC BANDING PORT REVISION;  Surgeon: Pedro Earls, MD;  Location: [facility];  Service: General;  Laterality: N/A;   GASTRIC ROUX-EN-Y N/A 06/06/2017   Procedure: Conversion from [place] to [place];  Surgeon: Johnathan Hausen, MD;  Location: [facility];  Service: General;  Laterality: N/A;   [place]  12/30/09   LAPAROSCOPIC GASTRIC SLEEVE RESECTION N/A 07/02/2013   Procedure: LAPAROSCOPIC GASTRIC SLEEVE RESECTION upper endoscopy;  Surgeon: Pedro Earls, MD;  Location: [facility];  Service: General;  Laterality: N/A;    LAPAROSCOPIC REPAIR AND REMOVAL OF GASTRIC BAND N/A 07/02/2013   Procedure: LAPAROSCOPIC REMOVAL OF GASTRIC BAND;  Surgeon: Pedro Earls, MD;  Location: [facility];  Service: General;  Laterality: N/A;   LAPAROSCOPIC REVISION OF GASTRIC BAND  07/03/2012   Procedure: LAPAROSCOPIC REVISION OF GASTRIC BAND;  Surgeon: Pedro Earls, MD;  Location: [facility];  Service: General;  Laterality: N/A;  removal of old lap. band port, replaced with AP standard band   LAPAROSCOPY  09/24/2011   Procedure: LAPAROSCOPY DIAGNOSTIC;  Surgeon: Pedro Earls, MD;  Location: [facility];  Service: General;  Laterality: N/A;   LAPAROSCOPY  07/03/2012   Procedure: LAPAROSCOPY DIAGNOSTIC;  Surgeon: Pedro Earls, MD;  Location: [facility];  Service: General;  Laterality: N/A;  Exploratory Laparoscopy    MESH APPLIED TO LAP PORT  07/03/2012   Procedure: MESH APPLIED TO LAP PORT;  Surgeon: Pedro Earls, MD;  Location: [facility];  Service: General;  Laterality: N/A;   [place]   X 2   right knee  1981   ARTHROSCOPY AND ARTHROTOMY   right knee arthroscopy and arthrotomy  12-1979   TONSILLECTOMY  1971   TUBAL LIGATION  1993   WITH C -SECTION    Family History  Problem Relation Age of Onset   Diabetes Mother    Stroke Mother    Breast cancer Mother    Atrial fibrillation Mother    Hypertension Mother    Cancer Mother 46       breast   Diabetes Father    Stroke Father 10       2nd 23 month apart   Hypertension Father    Heart attack Father 42       stents   Dementia Father    AAA (abdominal aortic aneurysm) Father 6       repair   CAD Father 44   Thyroid disease Other    Heart disease Other    COPD Other     Social History   Socioeconomic History   Marital status: Married    Spouse name: Alia Parsley   Number of children: 2   Years of education: Not on file   Highest education level: Not on file  Occupational History   Occupation: Programmer, multimedia: [employer]   Tobacco Use   Smoking status: Never   Smokeless tobacco: Never  Vaping Use   Vaping Use: Never used  Substance and Sexual Activity   Alcohol use: Not Currently   Drug use: Never   Sexual activity: Yes    Birth control/protection: None  Other Topics Concern   Not on file  Social History Narrative   Right handed   Two story home   Drinks caffeine occasionally   Social Determinants of Health   Financial Resource Strain: Not on file  Food  Insecurity: Not on file  Transportation Needs: Not on file  Physical Activity: Not on file  Stress: Not on file  Social Connections: Not on file  Intimate Partner Violence: Not on file    Outpatient Medications Prior to Visit  Medication Sig Dispense Refill   acetaminophen (TYLENOL) 650 MG CR tablet Take by mouth.     albuterol (VENTOLIN HFA) 108 (90 Base) MCG/ACT inhaler      Ca Phosphate-Cholecalciferol (CALTRATE GUMMY BITES PO) Take 2 each 2 (two) times daily by mouth.      calcitonin, salmon, (MIACALCIN/FORTICAL) 200 UNIT/ACT nasal spray Instill 1 spray by nasal route every day, alternating nostrils. 3.7 mL 1   Cholecalciferol 125 MCG (5000 UT) TABS take 1 tablet by oral route every day     conjugated estrogens (PREMARIN) vaginal cream INSERT 1 GRAM of cream per vagina THREE TIMES A WEEK BY VAGINAL ROUTE. 30 g 5   Cyanocobalamin 3000 MCG/ML LIQD as directed Sublingual     cyclobenzaprine (FLEXERIL) 10 MG tablet Take 1 tablet (10 mg total) by mouth before bedtime for muscle spasms . 30 tablet 2   cyclobenzaprine (FLEXERIL) 10 MG tablet Take 1 tablet by mouth at bedtime as needed.     diclofenac Sodium (VOLTAREN) 1 % GEL Apply 4 g topically 4 (four) times daily.     Efinaconazole (JUBLIA) 10 % SOLN APPLY TO AFFECTED TOENAILS ONCE DAILY FOR 48 WEEKS 8 mL 11   Emollient (DERMEND BRUISE FORMULA) CREA as directed Externally     estradiol (DOTTI) 0.1 MG/24HR patch Apply 1 patch twice a week by transdermal route. 24 patch 4   ferric  derisomaltose (MONOFERRIC) 1000 MG/10ML SOLN injection as directed Intravenous     fluticasone (FLOVENT HFA) 110 MCG/ACT inhaler      Galcanezumab-gnlm (EMGALITY) 120 MG/ML SOAJ INJECT 120 MG INTO THE SKIN EVERY 28 (TWENTY-EIGHT) DAYS. 1 mL 11   glucose blood test strip Test blood sugar once daily. Dx Code: E11.9 100 each 12   hydrALAZINE (APRESOLINE) 25 MG tablet Take 1 tablet (25 mg total) by mouth 2 (two) times daily. Hold if feeling dizzy or SBP<110 mmHg).  Can use additional 25 mg as needed for SBP>150 mmHg 180 tablet 3   hydroxychloroquine (PLAQUENIL) 200 MG tablet Take 2 tablets (400 mg total) by mouth daily. 180 tablet 1   loratadine (CLARITIN) 10 MG tablet 10 mg by oral route.     methocarbamol (ROBAXIN) 500 MG tablet Take 1 tablet (500 mg total) by mouth 4 (four) times daily as needed for muscle spasms. 120 tablet 2   Multiple Vitamins-Minerals (CENTRUM ADULTS) TABS take 2 tablets by oral route  every day with food     olmesartan (BENICAR) 20 MG tablet Take 1 tablet (20 mg total) by mouth daily with supper. 90 tablet 1   predniSONE (DELTASONE) 5 MG tablet Take 6 tablets (30 mg total) by mouth daily for 1 day, THEN 5 tablets (25 mg total) daily for 1 day, THEN 4 tablets (20 mg total) daily for 1 day, THEN 3 tablets (15 mg total) daily for 1 day, THEN 2 tablets (10 mg total) daily for 1 day, THEN 1 tablet (5 mg total) daily for 1 day. 21 tablet 0   thiamine (VITAMIN B-1) 100 MG tablet Take 100 mg daily by mouth.     Tocilizumab (ACTEMRA) 80 MG/4ML SOLN injection Inject into the vein. 26.88 mL    Ubrogepant (UBRELVY) 100 MG TABS TAKE ONE TABLET BY MOUTH DAILY AS NEEDED  FOR MIGRAINE. MAX 2 TABLETS IN 24 HOURS 16 tablet 1   DULoxetine (CYMBALTA) 30 MG capsule Take 1 capsule (30 mg total) by mouth 2 (two) times daily. 180 capsule 1   furosemide (LASIX) 20 MG tablet Take 1 tablet (20 mg total) by mouth every morning. 90 tablet 0   influenza vaccine adjuvanted (FLUAD QUADRIVALENT) 0.5 ML injection  Inject into the muscle. 0.5 mL 0   pantoprazole (PROTONIX) 40 MG tablet Take 1 tablet (40 mg total) by mouth daily. 90 tablet 3   potassium chloride (KLOR-CON M) 10 MEQ tablet Take 1 tablet (10 mEq total) by mouth daily. 90 tablet 0   rosuvastatin (CRESTOR) 20 MG tablet TAKE 1 TABLET BY MOUTH ONCE DAILY 90 tablet 1   Semaglutide, 2 MG/DOSE, (OZEMPIC, 2 MG/DOSE,) 8 MG/3ML SOPN Inject 2 mg as directed once a week. 3 mL 3   temazepam (RESTORIL) 30 MG capsule Take 1 capsule (30 mg total) by mouth at bedtime as needed for sleep. 90 capsule 1   Abatacept (ORENCIA CLICKJECT) 564 MG/ML SOAJ      ALPRAZolam (XANAX) 0.25 MG tablet      amoxicillin (AMOXIL) 875 MG tablet Take 1 tablet (875 mg total) by mouth 2 (two) times daily. 14 tablet 0   folic acid (FOLVITE) 1 MG tablet 1 tablet     HYDROcodone-acetaminophen (NORCO/VICODIN) 5-325 MG tablet TAKE 1 TABLET BY MOUTH EVERY 4-6 HOURS AS NEEDED FOR PAIN FOR 5 DAYS.     Insulin Pen Needle (TECHLITE PEN NEEDLES) 32G X 6 MM MISC Use with Ozempic as directed 100 each 2   liraglutide (VICTOZA) 18 MG/3ML SOPN      No facility-administered medications prior to visit.    Allergies  Allergen Reactions   Gabapentin Swelling    Pt states she has eye swelling    Isoniazid     Severe SOB   Morphine Hives and Nausea And Vomiting    Allergic to PCA pump only    Nitrofurantoin Shortness Of Breath, Rash and Other (See Comments)    REACTION: welts   Tamiflu [Oseltamivir Phosphate] Nausea And Vomiting    "I vomited within 30 minutes of taking it and was told I cannot ever take it again."   Hctz [Hydrochlorothiazide] Rash   Macrolides And Ketolides Rash   Promethazine Hcl Other (See Comments)    IF GIVEN  IV-hallucinations     CAN TAKE PO PHENERGAN Other reaction(s): Unknown   Roxicet [Oxycodone-Acetaminophen] Itching   Wound Dressing Adhesive Rash    Adhesive Tape    Fentanyl Hives and Rash    REACTION: welts   Sulfamethoxazole-Trimethoprim Rash    Review  of Systems  Constitutional:  Negative for fever and malaise/fatigue.  HENT:  Negative for congestion.   Eyes:  Negative for blurred vision.  Respiratory:  Negative for shortness of breath.   Cardiovascular:  Negative for chest pain, palpitations and leg swelling.  Gastrointestinal:  Negative for abdominal pain, blood in stool and nausea.  Genitourinary:  Negative for dysuria and frequency.  Musculoskeletal:  Negative for falls.  Skin:  Negative for rash.  Neurological:  Negative for dizziness, loss of consciousness and headaches.  Endo/Heme/Allergies:  Negative for environmental allergies.  Psychiatric/Behavioral:  Negative for depression. The patient is not nervous/anxious.        Objective:    Physical Exam Vitals and nursing note reviewed.  Constitutional:      General: She is not in acute distress.    Appearance: Normal appearance. She  is well-developed. She is not ill-appearing.  HENT:     Head: Normocephalic and atraumatic.     Right Ear: External ear normal.     Left Ear: External ear normal.  Eyes:     Extraocular Movements: Extraocular movements intact.     Conjunctiva/sclera: Conjunctivae normal.     Pupils: Pupils are equal, round, and reactive to light.  Neck:     Thyroid: No thyromegaly.     Vascular: No carotid bruit or JVD.  Cardiovascular:     Rate and Rhythm: Normal rate and regular rhythm.     Heart sounds: Normal heart sounds. No murmur heard.    No gallop.  Pulmonary:     Effort: Pulmonary effort is normal. No respiratory distress.     Breath sounds: Normal breath sounds. No wheezing or rales.  Chest:     Chest wall: No tenderness.  Musculoskeletal:     Cervical back: Normal range of motion and neck supple.  Skin:    General: Skin is warm and dry.  Neurological:     General: No focal deficit present.     Mental Status: She is alert and oriented to person, place, and time.  Psychiatric:        Judgment: Judgment normal.     BP 124/70 (BP  Location: Left Arm, Patient Position: Sitting, Cuff Size: Normal)   Pulse 78   Temp 98.4 F (36.9 C) (Oral)   Resp 18   Ht '5\' 11"'$  (1.803 m)   Wt 205 lb 6.4 oz (93.2 kg)   SpO2 97%   BMI 28.65 kg/m  Wt Readings from Last 3 Encounters:  04/22/22 205 lb 6.4 oz (93.2 kg)  04/19/22 201 lb (91.2 kg)  02/03/22 201 lb 12.8 oz (91.5 kg)    Diabetic Foot Exam - Simple   No data filed    Lab Results  Component Value Date   WBC 7.6 02/03/2022   HGB 13.5 02/03/2022   HCT 41.2 02/03/2022   PLT 151 02/03/2022   GLUCOSE 95 10/22/2021   CHOL 119 04/22/2022   TRIG 68.0 04/22/2022   HDL 60.60 04/22/2022   LDLCALC 45 04/22/2022   ALT 30 10/22/2021   AST 29 10/22/2021   NA 139 10/22/2021   K 4.3 10/22/2021   CL 101 10/22/2021   CREATININE 0.82 10/22/2021   BUN 11 10/22/2021   CO2 33 (H) 10/22/2021   TSH 1.67 10/22/2021   HGBA1C 5.9 04/22/2022   MICROALBUR <0.7 10/22/2021    Lab Results  Component Value Date   TSH 1.67 10/22/2021   Lab Results  Component Value Date   WBC 7.6 02/03/2022   HGB 13.5 02/03/2022   HCT 41.2 02/03/2022   MCV 94.5 02/03/2022   PLT 151 02/03/2022   Lab Results  Component Value Date   NA 139 10/22/2021   K 4.3 10/22/2021   CO2 33 (H) 10/22/2021   GLUCOSE 95 10/22/2021   BUN 11 10/22/2021   CREATININE 0.82 10/22/2021   BILITOT 1.0 10/22/2021   ALKPHOS 82 10/22/2021   AST 29 10/22/2021   ALT 30 10/22/2021   PROT 6.6 10/22/2021   ALBUMIN 4.4 10/22/2021   CALCIUM 9.6 10/22/2021   ANIONGAP 7 06/09/2021   GFR 75.39 10/22/2021   Lab Results  Component Value Date   CHOL 119 04/22/2022   Lab Results  Component Value Date   HDL 60.60 04/22/2022   Lab Results  Component Value Date   LDLCALC 45 04/22/2022   Lab  Results  Component Value Date   TRIG 68.0 04/22/2022   Lab Results  Component Value Date   CHOLHDL 2 04/22/2022   Lab Results  Component Value Date   HGBA1C 5.9 04/22/2022       Assessment & Plan:   Problem List Items  Addressed This Visit       Unprioritized   Hyperlipidemia associated with type 2 diabetes mellitus ([place]) (Chronic)    Encourage heart healthy diet such as MIND or DASH diet, increase exercise, avoid trans fats, simple carbohydrates and processed foods, consider a krill or fish or flaxseed oil cap daily.       Relevant Medications   furosemide (LASIX) 20 MG tablet   rosuvastatin (CRESTOR) 20 MG tablet   Semaglutide, 2 MG/DOSE, (OZEMPIC, 2 MG/DOSE,) 8 MG/3ML SOPN   GERD (gastroesophageal reflux disease)   Relevant Medications   pantoprazole (PROTONIX) 40 MG tablet   Tinea corporis   Relevant Medications   nystatin (MYCOSTATIN/NYSTOP) powder   Depression with anxiety   Relevant Medications   DULoxetine (CYMBALTA) 30 MG capsule   Rheumatoid arthritis involving multiple sites with positive rheumatoid factor ([place]) - Primary    Per rheum      Relevant Medications   Tocilizumab (ACTEMRA) 80 MG/4ML SOLN injection   Primary hypertension    Well controlled, no changes to meds. Encouraged heart healthy diet such as the DASH diet and exercise as tolerated.       Relevant Medications   furosemide (LASIX) 20 MG tablet   rosuvastatin (CRESTOR) 20 MG tablet   Controlled type 2 diabetes mellitus with diabetic neuropathy, without long-term current use of insulin ([place])    hgba1c to be checked, minimize simple carbs. Increase exercise as tolerated. Continue current meds       Relevant Medications   rosuvastatin (CRESTOR) 20 MG tablet   Semaglutide, 2 MG/DOSE, (OZEMPIC, 2 MG/DOSE,) 8 MG/3ML SOPN   Controlled type 2 diabetes mellitus with hyperglycemia, without long-term current use of insulin (HCC)    con't ozempic  Check a1c       Relevant Medications   rosuvastatin (CRESTOR) 20 MG tablet   Semaglutide, 2 MG/DOSE, (OZEMPIC, 2 MG/DOSE,) 8 MG/3ML SOPN   Other Visit Diagnoses     Essential hypertension       Relevant Medications   furosemide (LASIX) 20 MG tablet   potassium chloride  (KLOR-CON M) 10 MEQ tablet   rosuvastatin (CRESTOR) 20 MG tablet   Other Relevant Orders   Lipid panel (Completed)   Hemoglobin A1c (Completed)   Hyperlipidemia, unspecified hyperlipidemia type       Relevant Medications   furosemide (LASIX) 20 MG tablet   rosuvastatin (CRESTOR) 20 MG tablet   Other Relevant Orders   Lipid panel (Completed)   Hemoglobin A1c (Completed)   Other insomnia       Relevant Medications   temazepam (RESTORIL) 30 MG capsule   Controlled type 2 diabetes mellitus with complication, without long-term current use of insulin (HCC)       Relevant Medications   rosuvastatin (CRESTOR) 20 MG tablet   Semaglutide, 2 MG/DOSE, (OZEMPIC, 2 MG/DOSE,) 8 MG/3ML SOPN   Other Relevant Orders   Lipid panel (Completed)   Hemoglobin A1c (Completed)        Meds ordered this encounter  Medications   DULoxetine (CYMBALTA) 30 MG capsule    Sig: Take 1 capsule (30 mg total) by mouth 2 (two) times daily.    Dispense:  180 capsule    Refill:  1   furosemide (LASIX) 20 MG tablet    Sig: Take 1 tablet (20 mg total) by mouth every morning.    Dispense:  90 tablet    Refill:  0   pantoprazole (PROTONIX) 40 MG tablet    Sig: Take 1 tablet (40 mg total) by mouth daily.    Dispense:  90 tablet    Refill:  3   potassium chloride (KLOR-CON M) 10 MEQ tablet    Sig: Take 1 tablet (10 mEq total) by mouth daily.    Dispense:  90 tablet    Refill:  0   rosuvastatin (CRESTOR) 20 MG tablet    Sig: Take 1 tablet (20 mg total) by mouth daily.    Dispense:  90 tablet    Refill:  1   Semaglutide, 2 MG/DOSE, (OZEMPIC, 2 MG/DOSE,) 8 MG/3ML SOPN    Sig: Inject 2 mg as directed once a week.    Dispense:  3 mL    Refill:  3   temazepam (RESTORIL) 30 MG capsule    Sig: Take 1 capsule (30 mg total) by mouth at bedtime as needed for sleep.    Dispense:  90 capsule    Refill:  1   nystatin (MYCOSTATIN/NYSTOP) powder    Sig: APPLY 1 APPLICATION TOPICALLY 3 (THREE) TIMES DAILY.    Dispense:   15 g    Refill:  5    I, [name], DO, personally preformed the services described in this documentation.  All medical record entries made by the scribe were at my direction and in my presence.  I have reviewed the chart and discharge instructions (if applicable) and agree that the record reflects my personal performance and is accurate and complete. 04/22/2022   I,[name],acting as a scribe for [name], DO.,have documented all relevant documentation on the behalf of [name], DO,as directed by  [name], DO while in the presence of [name], DO.   [name], DO

## 2022-04-22 NOTE — Patient Instructions (Signed)
Cholesterol Content in Foods ?Cholesterol is a waxy, fat-like substance that helps to carry fat in the blood. The body needs cholesterol in small amounts, but too much cholesterol can cause damage to the arteries and heart. ?What foods have cholesterol? ? ?Cholesterol is found in animal-based foods, such as meat, seafood, and dairy. Generally, low-fat dairy and lean meats have less cholesterol than full-fat dairy and fatty meats. The milligrams of cholesterol per serving (mg per serving) of common cholesterol-containing foods are listed below. ?Meats and other proteins ?Egg -- one large whole egg has 186 mg. ?Veal shank -- 4 oz (113 g) has 141 mg. ?Lean ground turkey (93% lean) -- 4 oz (113 g) has 118 mg. ?Fat-trimmed lamb loin -- 4 oz (113 g) has 106 mg. ?Lean ground beef (90% lean) -- 4 oz (113 g) has 100 mg. ?Lobster -- 3.5 oz (99 g) has 90 mg. ?Pork loin chops -- 4 oz (113 g) has 86 mg. ?Canned salmon -- 3.5 oz (99 g) has 83 mg. ?Fat-trimmed beef top loin -- 4 oz (113 g) has 78 mg. ?Frankfurter -- 1 frank (3.5 oz or 99 g) has 77 mg. ?Crab -- 3.5 oz (99 g) has 71 mg. ?Roasted chicken without skin, white meat -- 4 oz (113 g) has 66 mg. ?Light bologna -- 2 oz (57 g) has 45 mg. ?Deli-cut turkey -- 2 oz (57 g) has 31 mg. ?Canned tuna -- 3.5 oz (99 g) has 31 mg. ?Bacon -- 1 oz (28 g) has 29 mg. ?Oysters and mussels (raw) -- 3.5 oz (99 g) has 25 mg. ?Mackerel -- 1 oz (28 g) has 22 mg. ?Trout -- 1 oz (28 g) has 20 mg. ?Pork sausage -- 1 link (1 oz or 28 g) has 17 mg. ?Salmon -- 1 oz (28 g) has 16 mg. ?Tilapia -- 1 oz (28 g) has 14 mg. ?Dairy ?Soft-serve ice cream -- ? cup (4 oz or 86 g) has 103 mg. ?Whole-milk yogurt -- 1 cup (8 oz or 245 g) has 29 mg. ?Cheddar cheese -- 1 oz (28 g) has 28 mg. ?American cheese -- 1 oz (28 g) has 28 mg. ?Whole milk -- 1 cup (8 oz or 250 mL) has 23 mg. ?2% milk -- 1 cup (8 oz or 250 mL) has 18 mg. ?Cream cheese -- 1 tablespoon (Tbsp) (14.5 g) has 15 mg. ?Cottage cheese -- ? cup (4 oz or  113 g) has 14 mg. ?Low-fat (1%) milk -- 1 cup (8 oz or 250 mL) has 10 mg. ?Sour cream -- 1 Tbsp (12 g) has 8.5 mg. ?Low-fat yogurt -- 1 cup (8 oz or 245 g) has 8 mg. ?Nonfat Greek yogurt -- 1 cup (8 oz or 228 g) has 7 mg. ?Half-and-half cream -- 1 Tbsp (15 mL) has 5 mg. ?Fats and oils ?Cod liver oil -- 1 tablespoon (Tbsp) (13.6 g) has 82 mg. ?Butter -- 1 Tbsp (14 g) has 15 mg. ?Lard -- 1 Tbsp (12.8 g) has 14 mg. ?Bacon grease -- 1 Tbsp (12.9 g) has 14 mg. ?Mayonnaise -- 1 Tbsp (13.8 g) has 5-10 mg. ?Margarine -- 1 Tbsp (14 g) has 3-10 mg. ?The items listed above may not be a complete list of foods with cholesterol. Exact amounts of cholesterol in these foods may vary depending on specific ingredients and brands. Contact a dietitian for more information. ?What foods do not have cholesterol? ?Most plant-based foods do not have cholesterol unless you combine them with a food that has   cholesterol. Foods without cholesterol include: ?Grains and cereals. ?Vegetables. ?Fruits. ?Vegetable oils, such as olive, canola, and sunflower oil. ?Legumes, such as peas, beans, and lentils. ?Nuts and seeds. ?Egg whites. ?The items listed above may not be a complete list of foods that do not have cholesterol. Contact a dietitian for more information. ?Summary ?The body needs cholesterol in small amounts, but too much cholesterol can cause damage to the arteries and heart. ?Cholesterol is found in animal-based foods, such as meat, seafood, and dairy. Generally, low-fat dairy and lean meats have less cholesterol than full-fat dairy and fatty meats. ?This information is not intended to replace advice given to you by your health care provider. Make sure you discuss any questions you have with your health care provider. ?Document Revised: 11/07/2020 Document Reviewed: 11/07/2020 ?Elsevier Patient Education ? 2023 [employer] ? ?

## 2022-04-22 NOTE — Assessment & Plan Note (Signed)
con't ozempic  Check a1c

## 2022-04-22 NOTE — Assessment & Plan Note (Signed)
Encourage heart healthy diet such as MIND or DASH diet, increase exercise, avoid trans fats, simple carbohydrates and processed foods, consider a krill or fish or flaxseed oil cap daily.  °

## 2022-04-26 DIAGNOSIS — E119 Type 2 diabetes mellitus without complications: Secondary | ICD-10-CM | POA: Diagnosis not present

## 2022-04-26 DIAGNOSIS — H25812 Combined forms of age-related cataract, left eye: Secondary | ICD-10-CM | POA: Diagnosis not present

## 2022-04-26 DIAGNOSIS — H2511 Age-related nuclear cataract, right eye: Secondary | ICD-10-CM | POA: Diagnosis not present

## 2022-04-26 DIAGNOSIS — Z79899 Other long term (current) drug therapy: Secondary | ICD-10-CM | POA: Diagnosis not present

## 2022-04-26 DIAGNOSIS — H04123 Dry eye syndrome of bilateral lacrimal glands: Secondary | ICD-10-CM | POA: Diagnosis not present

## 2022-04-27 ENCOUNTER — Other Ambulatory Visit (HOSPITAL_COMMUNITY): Payer: Self-pay | Admitting: Orthopedic Surgery

## 2022-04-27 DIAGNOSIS — M84374A Stress fracture, right foot, initial encounter for fracture: Secondary | ICD-10-CM

## 2022-04-27 DIAGNOSIS — S92334A Nondisplaced fracture of third metatarsal bone, right foot, initial encounter for closed fracture: Secondary | ICD-10-CM | POA: Diagnosis not present

## 2022-05-04 ENCOUNTER — Ambulatory Visit: Payer: 59 | Admitting: Family Medicine

## 2022-05-05 ENCOUNTER — Ambulatory Visit (HOSPITAL_COMMUNITY)
Admission: RE | Admit: 2022-05-05 | Discharge: 2022-05-05 | Disposition: A | Discharge status: Home / Self Care | Payer: 59 | Source: Ambulatory Visit | Attending: Orthopedic Surgery | Admitting: Orthopedic Surgery

## 2022-05-05 DIAGNOSIS — M84374A Stress fracture, right foot, initial encounter for fracture: Secondary | ICD-10-CM | POA: Diagnosis not present

## 2022-05-05 DIAGNOSIS — S92331A Displaced fracture of third metatarsal bone, right foot, initial encounter for closed fracture: Secondary | ICD-10-CM | POA: Diagnosis not present

## 2022-05-06 DIAGNOSIS — Z79899 Other long term (current) drug therapy: Secondary | ICD-10-CM | POA: Diagnosis not present

## 2022-05-07 ENCOUNTER — Other Ambulatory Visit (HOSPITAL_BASED_OUTPATIENT_CLINIC_OR_DEPARTMENT_OTHER): Payer: Self-pay

## 2022-05-10 ENCOUNTER — Other Ambulatory Visit (HOSPITAL_BASED_OUTPATIENT_CLINIC_OR_DEPARTMENT_OTHER): Payer: Self-pay

## 2022-05-11 ENCOUNTER — Other Ambulatory Visit (HOSPITAL_BASED_OUTPATIENT_CLINIC_OR_DEPARTMENT_OTHER): Payer: Self-pay

## 2022-05-19 ENCOUNTER — Encounter: Payer: Self-pay | Admitting: Podiatry

## 2022-05-19 ENCOUNTER — Ambulatory Visit: Payer: 59 | Admitting: Podiatry

## 2022-05-19 ENCOUNTER — Other Ambulatory Visit (HOSPITAL_BASED_OUTPATIENT_CLINIC_OR_DEPARTMENT_OTHER): Payer: Self-pay

## 2022-05-19 DIAGNOSIS — B351 Tinea unguium: Secondary | ICD-10-CM

## 2022-05-19 DIAGNOSIS — M84374A Stress fracture, right foot, initial encounter for fracture: Secondary | ICD-10-CM

## 2022-05-19 DIAGNOSIS — E1142 Type 2 diabetes mellitus with diabetic polyneuropathy: Secondary | ICD-10-CM

## 2022-05-19 DIAGNOSIS — L84 Corns and callosities: Secondary | ICD-10-CM

## 2022-05-19 MED ORDER — MUPIROCIN 2 % EX OINT
TOPICAL_OINTMENT | CUTANEOUS | 1 refills | Status: DC
  Filled 2022-05-19: qty 22, 10d supply, fill #0
  Filled 2022-06-23: qty 22, 10d supply, fill #1

## 2022-05-19 MED ORDER — CALCITONIN (SALMON) 200 UNIT/ACT NA SOLN
1.0000 | Freq: Every day | NASAL | 1 refills | Status: DC
  Filled 2022-05-19: qty 3.7, 30d supply, fill #0
  Filled 2022-06-23: qty 3.7, 30d supply, fill #1

## 2022-05-19 NOTE — Progress Notes (Signed)
Subjective:  Patient ID: [name], female    DOB: 15-Aug-1955,  MRN: [id number]  [name] presents to clinic today for at risk foot care with history of diabetic neuropathy and corn(s) b/l lower extremities which interfere(s) with ambulation. Aggravating factors include wearing enclosed shoe gear. Pain is relieved with periodic professional debridement.  Chief Complaint  Patient presents with   Nail Problem    DFC  BG - 105  A1C -  5.9 October 12  PCP - Dr Lyndal Pulley    New problem(s):  Patient has new complaint of stress fracture of right 4th metatarsal. She has been seen by [facility], but states she has been unable to get a follow up visit. She would like to see one of our practitioners for f/u. Her stress fx was confirmed via MRI. She has been wearing a boot daily . She has had a course of Prednisone.  Patient states her husband will be having surgery soon and she needs to be available to care for him. She is requesting refill of Mupirocin Ointment.   PCP is Carollee Herter, Alferd Apa, DO , and last visit was April 22, 2022.  Allergies  Allergen Reactions   Gabapentin Swelling    Pt states she has eye swelling    Isoniazid     Severe SOB   Morphine Hives and Nausea And Vomiting    Allergic to PCA pump only    Nitrofurantoin Shortness Of Breath, Rash and Other (See Comments)    REACTION: welts   Tamiflu [Oseltamivir Phosphate] Nausea And Vomiting    "I vomited within 30 minutes of taking it and was told I cannot ever take it again."   Hctz [Hydrochlorothiazide] Rash   Macrolides And Ketolides Rash   Promethazine Hcl Other (See Comments)    IF GIVEN  IV-hallucinations     CAN TAKE PO PHENERGAN Other reaction(s): Unknown   Roxicet [Oxycodone-Acetaminophen] Itching   Wound Dressing Adhesive Rash    Adhesive Tape    Fentanyl Hives and Rash    REACTION: welts   Sulfamethoxazole-Trimethoprim Rash    Review of Systems: Negative except as noted in  the HPI.  Objective:   [name] is a pleasant 66 y.o. female in NAD. AAO x 3.  Vascular Examination: CFT <3 seconds b/l LE. Palpable DP/PT pulses b/l LE. Digital hair absent b/l. Skin temperature gradient WNL b/l. No pain with calf compression b/l. She has edema noted dorsal forefoot area RLE. No cyanosis or clubbing noted b/l LE.  Dermatological Examination: Pedal integument with normal turgor, texture and tone BLE. No open wounds b/l LE. No interdigital macerations noted b/l LE. No hyperkeratotic lesion noted today.  Toenails 1-5 b/l discolored, dystrophic, thickened, crumbly with subungual debris and tenderness to dorsal palpation.  Minimal hyperkeratosis distal tip right 3-5 digits.  Musculoskeletal Examination: Patient ambulating with walking boot RLE. Inversion deformity of right ankle. Clawtoe deformity R 3rd toe, R 4th toe, and R 5th toe.  Neurological Examination: Pt has subjective symptoms of neuropathy. Protective sensation intact 5/5 intact bilaterally with 10g monofilament b/l.  Assessment/Plan: 1. Pain due to onychomycosis of toenails of both feet   2. Corns   3. Stress fracture of right foot, initial encounter   4. Diabetic peripheral neuropathy associated with type 2 diabetes mellitus ([place])     Meds ordered this encounter  Medications   mupirocin ointment (BACTROBAN) 2 %    Sig: Apply to toes as needed  Dispense:  22 g    Refill:  1    -Patient was evaluated and treated. All patient's and/or POA's questions/concerns answered on today's visit. -Discussed stress fracture and informed her resolution could take an extended amount of time due to her RA and immunosuppressive medications. She related understanding. Continue walking boot and we will have her follow up with Dr. Jacqualyn Posey in a few weeks for f/u xrays. She will schedule after her husband's procedure. She is in agreement. Refilled Mupirocin Ointment. -Toenails 1-5 b/l were debrided in length and girth  with sterile nail nippers and dremel without iatrogenic bleeding.  -Corn(s) 3-5 right foot pared utilizing sterile scalpel blade without complication or incident. Total number debrided=3. -Patient/POA to call should there be question/concern in the interim.   Return in about 3 months (around 08/19/2022).  Marzetta Board, DPM

## 2022-05-20 ENCOUNTER — Other Ambulatory Visit (HOSPITAL_BASED_OUTPATIENT_CLINIC_OR_DEPARTMENT_OTHER): Payer: Self-pay

## 2022-05-20 DIAGNOSIS — M0609 Rheumatoid arthritis without rheumatoid factor, multiple sites: Secondary | ICD-10-CM | POA: Diagnosis not present

## 2022-05-23 ENCOUNTER — Encounter: Payer: Self-pay | Admitting: Emergency Medicine

## 2022-05-23 ENCOUNTER — Ambulatory Visit
Admission: EM | Admit: 2022-05-23 | Discharge: 2022-05-23 | Disposition: A | Discharge status: Home / Self Care | Payer: 59 | Attending: Family Medicine | Admitting: Family Medicine

## 2022-05-23 DIAGNOSIS — J069 Acute upper respiratory infection, unspecified: Secondary | ICD-10-CM | POA: Diagnosis not present

## 2022-05-23 DIAGNOSIS — J209 Acute bronchitis, unspecified: Secondary | ICD-10-CM

## 2022-05-23 DIAGNOSIS — Z8709 Personal history of other diseases of the respiratory system: Secondary | ICD-10-CM

## 2022-05-23 LAB — POCT INFLUENZA A/B
Influenza A, POC: NEGATIVE
Influenza B, POC: NEGATIVE

## 2022-05-23 LAB — POC SARS CORONAVIRUS 2 AG -  ED: SARS Coronavirus 2 Ag: NEGATIVE

## 2022-05-23 MED ORDER — PREDNISONE 20 MG PO TABS: 20.0000 mg | ORAL_TABLET | Freq: Two times a day (BID) | ORAL | 0 refills | Status: DC

## 2022-05-23 MED ORDER — AMOXICILLIN-POT CLAVULANATE 875-125 MG PO TABS: 1.0000 | ORAL_TABLET | Freq: Two times a day (BID) | ORAL | 0 refills | Status: DC

## 2022-05-23 MED ORDER — PROMETHAZINE-DM 6.25-15 MG/5ML PO SYRP: 5.0000 mL | ORAL_SOLUTION | Freq: Four times a day (QID) | ORAL | 0 refills | Status: DC | PRN

## 2022-05-23 NOTE — Discharge Instructions (Signed)
Drink lots of fluids Continue to run a humidifier Continue to take Mucinex Add Augmentin 2 times a day Add prednisone 2 times a day.  Take medicine with food I have prescribed Phenergan DM for cough See your doctor if not improving by next week

## 2022-05-23 NOTE — ED Provider Notes (Signed)
[name]    CSN: [id number] Arrival date & time: 05/23/22  0805      History   Chief Complaint Chief Complaint  Patient presents with   Cough    HPI [name] is a 66 y.o. female.   HPI  Patient is retired Marine scientist.  She currently has been sick for the last 3 to 4 days.  She states she has a severe cough.  Coughing up phlegm.  The phlegm is thick and green.  Hard to expectorate.  She is taking Mucinex.  Had a fever to 102 yesterday.  She did a home COVID test that was negative.  She states she has a history of these "bronchial infections".  She states that she "always needs antibiotics".  Does have a history of asthma.  States never a smoker.  Past Medical History:  Diagnosis Date   Allergic rhinitis    Anxiety    Asthma    hx bronchial asthma at times with upper resp infection   Depression    Fibromyalgia    GERD (gastroesophageal reflux disease)    Headache(784.0)    migraine and cluster headaches   Hyperlipemia    Hypertension    IBS (irritable bowel syndrome)    Menopause    Neuromuscular disorder ([place]) 2009   hx of fibromyalgia, polyarthralsia (surgery induced)   Osteoarthritis    Rheumatoid arthritis ([place])    Complicated by osteoarthritis as well.   Rheumatoid arthritis ([place])    Stress incontinence    at times   Tibial fracture 10/14/2016   evulsion periostial right   Type 2 diabetes mellitus with complication, without long-term current use of insulin ([facility])    diet controlled no meds -> however was diagnosed with a diabetic foot ulcer.    Patient Active Problem List   Diagnosis Date Noted   Synovitis of toe 04/19/2022   Preventative health care 10/22/2021   Colon cancer screening 08/05/2021   Diabetic ulcer of toe of right foot associated with type 2 diabetes mellitus, limited to breakdown of skin ([place]) 06/19/2021   Tinea corporis 06/19/2021   Dysuria 06/19/2021   IDA (iron deficiency anemia) 05/18/2021   Pre-operative clearance  04/21/2021   Controlled type 2 diabetes mellitus with hyperglycemia, without long-term current use of insulin ([place]) 04/21/2021   Spinal stenosis of lumbar region 04/21/2021   Bronchitis due to COVID-19 virus 03/27/2021   Orthostatic hypotension 01/09/2021   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/09/2021   Spondylosis of cervical spine 10/07/2020   Lumbar radiculopathy 10/07/2020   Anemia 09/11/2020   Intractable migraine 08/26/2020   Inflammatory arthritis 07/22/2020   Thrombocytopenia ([place]) 07/22/2020   Chronic nonintractable headache 07/21/2020   Syncope 07/21/2020   Dizziness 07/21/2020   Cellulitis of right hand 07/21/2020   Lumbar spondylolysis 05/13/2020   Primary hypertension 04/02/2020   Body mass index (BMI) 29.0-29.9, adult 04/02/2020   Menopausal symptom 03/11/2020   Stress 03/11/2020   Unspecified dyspareunia (CODE) 03/11/2020   Bilateral sacroiliitis ([place]) 03/04/2020   Senile calcific aortic valve sclerosis 02/08/2020   DOE (dyspnea on exertion) 02/08/2020   Murmur 01/07/2020   Acquired trigger finger of right ring finger 10/25/2019   Pain in right knee 08/24/2019   Rheumatoid arthritis involving multiple sites with positive rheumatoid factor ([place]) 08/02/2019   Primary osteoarthritis of both knees 08/02/2019   PTSD (post-traumatic stress disorder) 02/08/2019   Dog bite of arm, right, initial encounter 10/05/2018   Hyperlipidemia associated with type 2 diabetes  mellitus ([place]) 08/24/2018   Symptomatic abdominal panniculus 12/12/2017   Carpal tunnel syndrome of left wrist 11/28/2017   Pain in right hand 11/08/2017   Cervical radiculopathy 10/06/2017   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   Keratosis 12/04/2014   Onychocryptosis 08/21/2014   Onychomycosis 05/21/2014   Fissured skin 01/22/2014   Fissure in skin of foot 12/18/2013   Porokeratosis 12/18/2013   Pain in lower limb 11/21/2013   Ingrown nail 07/20/2013   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)  Dec 2014 07/02/2013   Obesity (BMI 30-39.9) 06/25/2013   GERD (gastroesophageal reflux disease) 08/20/2011   Lapband APL + HH repair June 2011-Major revision Dec 2013 06/29/2011   ABDOMINAL PAIN, ACUTE 08/01/2010   VITAMIN B12 DEFICIENCY 04/01/2010   BARIATRIC SURGERY STATUS 01/29/2010   ONYCHOMYCOSIS, BILATERAL 06/30/2009   STRESS INCONTINENCE 06/10/2009   ACUTE PHARYNGITIS 04/29/2009   COUGH 04/27/2009   BRONCHITIS, ACUTE 04/16/2009   NAUSEA 04/08/2009   DIARRHEA 04/08/2009   MORBID OBESITY 03/03/2009   SKIN RASH 11/18/2008   UNSPECIFIED VITAMIN D DEFICIENCY 07/15/2008   OTHER SPECIFIED ANEMIAS 07/15/2008   Pain in joint, multiple sites 06/13/2008   Myalgia and myositis, unspecified 06/13/2008   BACK PAIN, LUMBAR 02/14/2008   ACUTE SINUSITIS, UNSPECIFIED 09/01/2007   CELLULITIS 08/12/2007   Cellulitis 08/12/2007   Depression with anxiety 06/09/2007   DEPRESSION 06/09/2007   Controlled type 2 diabetes mellitus with diabetic neuropathy, without long-term current use of insulin ([place]) 01/04/2007   Labile hypertension 01/04/2007   ALLERGIC RHINITIS 01/04/2007   HEADACHE 01/04/2007    Past Surgical History:  Procedure Laterality Date   ABDOMINAL HYSTERECTOMY  12/1999   complete   CARPAL TUNNEL RELEASE Right 12/27/2017   Procedure: CARPAL TUNNEL RELEASE;  Surgeon: Roseanne Kaufman, MD;  Location: [facility];  Service: Orthopedics;  Laterality: Right;   CERVICAL CONIZATION W/BX  june 1990   dysplasia of cervix/used 5Fu cream for 3 months   CERVICAL LAMINECTOMY  2005 & 2009   X 2   NO ROM [place]   X 2   CHOLECYSTECTOMY  1986   DILATION AND CURETTAGE OF UTERUS   [phone]   missed abortion   ESOPHAGOGASTRODUODENOSCOPY  06/29/2011   Procedure: ESOPHAGOGASTRODUODENOSCOPY (EGD);  Surgeon: Shann Medal, MD;  Location: [facility];  Service: General;  Laterality: N/A;   FOOT SURGERY  2005   RT HEEL   GASTRIC BANDING PORT REVISION  09/24/2011    Procedure: GASTRIC BANDING PORT REVISION;  Surgeon: Pedro Earls, MD;  Location: [facility];  Service: General;  Laterality: N/A;   GASTRIC ROUX-EN-Y N/A 06/06/2017   Procedure: Conversion from Sleeve to [place];  Surgeon: Johnathan Hausen, MD;  Location: [facility];  Service: General;  Laterality: N/A;   [place]  12/30/09   LAPAROSCOPIC GASTRIC SLEEVE RESECTION N/A 07/02/2013   Procedure: LAPAROSCOPIC GASTRIC SLEEVE RESECTION upper endoscopy;  Surgeon: Pedro Earls, MD;  Location: [facility];  Service: General;  Laterality: N/A;   LAPAROSCOPIC REPAIR AND REMOVAL OF GASTRIC BAND N/A 07/02/2013   Procedure: LAPAROSCOPIC REMOVAL OF GASTRIC BAND;  Surgeon: Pedro Earls, MD;  [facility];  Service: General;  Laterality: N/A;   LAPAROSCOPIC REVISION OF GASTRIC BAND  07/03/2012   Procedure: LAPAROSCOPIC REVISION OF GASTRIC BAND;  Surgeon: Pedro Earls, MD;  Location: [facility];  Service: General;  Laterality: N/A;  removal of old lap.  band port, replaced with AP standard band   LAPAROSCOPY  09/24/2011   Procedure: LAPAROSCOPY DIAGNOSTIC;  Surgeon: Pedro Earls, MD;  Location[facility];  Service: General;  Laterality: N/A;   LAPAROSCOPY  07/03/2012   Procedure: LAPAROSCOPY DIAGNOSTIC;  Surgeon: Pedro Earls, MD;  [facility];  Service: General;  Laterality: N/A;  Exploratory Laparoscopy    MESH APPLIED TO LAP PORT  07/03/2012   Procedure: MESH APPLIED TO LAP PORT;  Surgeon: Pedro Earls, MD;  Location[facility];  Service: General;  Laterality: N/A;   [place]   X 2   right knee  1981   ARTHROSCOPY AND ARTHROTOMY   right knee arthroscopy and arthrotomy  12-1979   TONSILLECTOMY  1971   TUBAL LIGATION  1993   WITH C -SECTION    OB History     Gravida  3   Para  0   Term  0   Preterm  0   AB  1   Living  2      SAB  1   IAB  0   Ectopic  0    Multiple  0   Live Births               Home Medications    Prior to Admission medications   Medication Sig Start Date End Date Taking? Authorizing Provider  amoxicillin-clavulanate (AUGMENTIN) 875-125 MG tablet Take 1 tablet by mouth every 12 (twelve) hours. 05/23/22  Yes Raylene Everts, MD  predniSONE (DELTASONE) 20 MG tablet Take 1 tablet (20 mg total) by mouth 2 (two) times daily with a meal. 05/23/22  Yes Raylene Everts, MD  promethazine-dextromethorphan (PROMETHAZINE-DM) 6.25-15 MG/5ML syrup Take 5 mLs by mouth 4 (four) times daily as needed for cough. 05/23/22  Yes Raylene Everts, MD  acetaminophen (TYLENOL) 650 MG CR tablet Take by mouth.    [provider]  albuterol (VENTOLIN HFA) 108 (90 Base) MCG/ACT inhaler  04/21/21   [provider]  Ca Phosphate-Cholecalciferol (CALTRATE GUMMY BITES PO) Take 2 each 2 (two) times daily by mouth.     [provider]  calcitonin, salmon, (MIACALCIN/FORTICAL) 200 UNIT/ACT nasal spray Instill 1 spray by nasal route every day, alternating nostrils. 07/09/21     calcitonin, salmon, (MIACALCIN/FORTICAL) 200 UNIT/ACT nasal spray Place 1 spray into alternate nostrils daily. 05/19/22     Cholecalciferol 125 MCG (5000 UT) TABS take 1 tablet by oral route every day    [provider]  conjugated estrogens (PREMARIN) vaginal cream INSERT 1 GRAM of cream per vagina THREE TIMES A WEEK BY VAGINAL ROUTE. 08/18/21     Cyanocobalamin 3000 MCG/ML LIQD as directed Sublingual    [provider]  diclofenac Sodium (VOLTAREN) 1 % GEL Apply 4 g topically 4 (four) times daily.    [provider]  DULoxetine (CYMBALTA) 30 MG capsule Take 1 capsule (30 mg total) by mouth 2 (two) times daily. 04/22/22   Carollee Herter, Corina Stacy R, DO  Efinaconazole (JUBLIA) 10 % SOLN APPLY TO AFFECTED TOENAILS ONCE DAILY FOR 48 WEEKS 03/09/22   [place], Stephani Police, DPM  Emollient (DERMEND BRUISE FORMULA) CREA as directed  Externally 06/04/20   [provider]  estradiol (DOTTI) 0.1 MG/24HR patch Apply 1 patch twice a week by transdermal route. 08/18/21     ferric derisomaltose (MONOFERRIC) 1000 MG/10ML SOLN injection as directed Intravenous    [provider]  fluticasone (FLOVENT HFA)  110 MCG/ACT inhaler     [provider]  furosemide (LASIX) 20 MG tablet Take 1 tablet (20 mg total) by mouth every morning. 04/22/22   Carollee Herter, Mazie Fencl R, DO  Galcanezumab-gnlm (EMGALITY) 120 MG/ML SOAJ INJECT 120 MG INTO THE SKIN EVERY 28 (TWENTY-EIGHT) DAYS. 09/09/21 09/09/22  Metta Clines R, DO  glucose blood test strip Test blood sugar once daily. Dx Code: E11.9 01/09/19   Carollee Herter, Alferd Apa, DO  hydrALAZINE (APRESOLINE) 25 MG tablet Take 1 tablet (25 mg total) by mouth 2 (two) times daily. Hold if feeling dizzy or SBP<110 mmHg).  Can use additional 25 mg as needed for SBP>150 mmHg 03/31/22   Leonie Man, MD  hydroxychloroquine (PLAQUENIL) 200 MG tablet Take 2 tablets (400 mg total) by mouth daily. 01/18/22   [name], DO  loratadine (CLARITIN) 10 MG tablet 10 mg by oral route.    [provider]  methocarbamol (ROBAXIN) 500 MG tablet Take 1 tablet (500 mg total) by mouth 4 (four) times daily as needed for muscle spasms. 10/13/21     Multiple Vitamins-Minerals (CENTRUM ADULTS) TABS take 2 tablets by oral route  every day with food    [provider]  mupirocin ointment (BACTROBAN) 2 % Apply to toes as needed 05/19/22   [place], Stephani Police, DPM  nystatin (MYCOSTATIN/NYSTOP) powder APPLY 1 APPLICATION TOPICALLY 3 (THREE) TIMES DAILY. 04/22/22 04/22/23  [name], DO  olmesartan (BENICAR) 20 MG tablet Take 1 tablet (20 mg total) by mouth daily with supper. 12/23/21   Leonie Man, MD  pantoprazole (PROTONIX) 40 MG tablet Take 1 tablet (40 mg total) by mouth daily. 04/22/22   [name], DO  potassium chloride (KLOR-CON M) 10 MEQ tablet Take 1 tablet (10  mEq total) by mouth daily. 04/22/22   [name], DO  rosuvastatin (CRESTOR) 20 MG tablet Take 1 tablet (20 mg total) by mouth daily. 04/22/22   [name], DO  Semaglutide, 2 MG/DOSE, (OZEMPIC, 2 MG/DOSE,) 8 MG/3ML SOPN Inject 2 mg as directed once a week. 04/22/22   Roma Schanz R, DO  temazepam (RESTORIL) 30 MG capsule Take 1 capsule (30 mg total) by mouth at bedtime as needed for sleep. 04/22/22   [name], DO  thiamine (VITAMIN B-1) 100 MG tablet Take 100 mg daily by mouth.    [provider]  Tocilizumab (ACTEMRA) 80 MG/4ML SOLN injection Inject into the vein. 04/22/22   Lowne Chase, Maigan Bittinger R, DO  Ubrogepant (UBRELVY) 100 MG TABS TAKE ONE TABLET BY MOUTH DAILY AS NEEDED FOR MIGRAINE. MAX 2 TABLETS IN 24 HOURS 12/21/21   Pieter Partridge, DO    Family History Family History  Problem Relation Age of Onset   Diabetes Mother    Stroke Mother    Breast cancer Mother    Atrial fibrillation Mother    Hypertension Mother    Cancer Mother 80       breast   Diabetes Father    Stroke Father 35       2nd 35 month apart   Hypertension Father    Heart attack Father 36       stents   Dementia Father    AAA (abdominal aortic aneurysm) Father 8       repair   CAD Father 80   Thyroid disease Other    Heart disease Other    COPD Other  Social History Social History   Tobacco Use   Smoking status: Never   Smokeless tobacco: Never  Vaping Use   Vaping Use: Never used  Substance Use Topics   Alcohol use: Not Currently   Drug use: Never     Allergies   Gabapentin, Isoniazid, Morphine, Nitrofurantoin, Tamiflu [oseltamivir phosphate], Hctz [hydrochlorothiazide], Macrolides and ketolides, Promethazine hcl, Roxicet [oxycodone-acetaminophen], Wound dressing adhesive, Fentanyl, and Sulfamethoxazole-trimethoprim Multiple allergies noted  Review of Systems Review of Systems See HPI  Physical Exam Triage Vital Signs ED Triage Vitals   Enc Vitals Group     BP 05/23/22 0823 131/73     Pulse Rate 05/23/22 0823 84     Resp 05/23/22 0823 18     Temp 05/23/22 0823 100 F (37.8 C)     Temp Source 05/23/22 0823 Oral     SpO2 05/23/22 0823 97 %     Weight 05/23/22 0825 205 lb 7.5 oz (93.2 kg)     Height 05/23/22 0825 '5\' 11"'$  (1.803 m)     Head Circumference --      Peak Flow --      Pain Score 05/23/22 0824 4     Pain Loc --      Pain Edu? --      Excl. in [place]? --    No data found.  Updated Vital Signs BP 131/73 (BP Location: Left Arm)   Pulse 84   Temp 100 F (37.8 C) (Oral)   Resp 18   Ht '5\' 11"'$  (1.803 m)   Wt 93.2 kg   SpO2 97%   BMI 28.66 kg/m   Physical Exam Constitutional:      General: She is not in acute distress.    Appearance: She is well-developed. She is ill-appearing.  HENT:     Head: Normocephalic and atraumatic.     Right Ear: Tympanic membrane and ear canal normal.     Left Ear: Tympanic membrane and ear canal normal.     Nose: Congestion present. No rhinorrhea.     Mouth/Throat:     Pharynx: Posterior oropharyngeal erythema present.  Eyes:     Conjunctiva/sclera: Conjunctivae normal.     Pupils: Pupils are equal, round, and reactive to light.  Cardiovascular:     Rate and Rhythm: Normal rate and regular rhythm.     Heart sounds: Normal heart sounds.  Pulmonary:     Effort: Pulmonary effort is normal. No respiratory distress.     Breath sounds: Wheezing and rhonchi present.  Abdominal:     General: There is no distension.     Palpations: Abdomen is soft.  Musculoskeletal:        General: Normal range of motion.     Cervical back: Normal range of motion.  Lymphadenopathy:     Cervical: No cervical adenopathy.  Skin:    General: Skin is warm and dry.  Neurological:     Mental Status: She is alert.  Psychiatric:        Mood and Affect: Mood normal.        Behavior: Behavior normal.      UC Treatments / Results  Labs (all labs ordered are listed, but only abnormal results  are displayed) Labs Reviewed  POCT INFLUENZA A/B  POC SARS CORONAVIRUS 2 AG -  ED    EKG   Radiology No results found.  Procedures Procedures (including critical care time)  Medications Ordered in UC Medications - No data to display  Initial Impression / Assessment  and Plan / UC Course  I have reviewed the triage vital signs and the nursing notes.  Pertinent labs & imaging results that were available during my care of the patient were reviewed by me and considered in my medical decision making (see chart for details).     Influenza is negative.  COVID test is negative.  Patient likely has a viral upper respiratory infection.  Because of her history of bronchitis and underlying asthma will treat with antibiotics and prednisone.  Patient requests promethazine cough medicine. Final Clinical Impressions(s) / UC Diagnoses   Final diagnoses:  Viral URI with cough  Acute bronchitis, unspecified organism  History of asthma     Discharge Instructions      Drink lots of fluids Continue to run a humidifier Continue to take Mucinex Add Augmentin 2 times a day Add prednisone 2 times a day.  Take medicine with food I have prescribed Phenergan DM for cough See your doctor if not improving by next week    ED Prescriptions     Medication Sig Dispense Auth. Provider   promethazine-dextromethorphan (PROMETHAZINE-DM) 6.25-15 MG/5ML syrup Take 5 mLs by mouth 4 (four) times daily as needed for cough. 118 mL Raylene Everts, MD   amoxicillin-clavulanate (AUGMENTIN) 875-125 MG tablet Take 1 tablet by mouth every 12 (twelve) hours. 14 tablet Raylene Everts, MD   predniSONE (DELTASONE) 20 MG tablet Take 1 tablet (20 mg total) by mouth 2 (two) times daily with a meal. 10 tablet Raylene Everts, MD      PDMP not reviewed this encounter.   Raylene Everts, MD 05/23/22 1006

## 2022-05-23 NOTE — ED Triage Notes (Addendum)
Cough x 3 days  Productive yellow & green  Husband is an ED  nurse at [facility] Tylenol 1 GM last night  Mucinex last 2 days  Tmax 102 yesterday Negative home COVID test yesterday

## 2022-06-04 ENCOUNTER — Other Ambulatory Visit (HOSPITAL_BASED_OUTPATIENT_CLINIC_OR_DEPARTMENT_OTHER): Payer: Self-pay

## 2022-06-09 NOTE — Progress Notes (Signed)
NEUROLOGY FOLLOW UP OFFICE NOTE  HONESTY MENTA [id number]  Assessment/Plan:   Migraine with/without aura, without status migrainosus, not intractable. Dizziness with episode of syncope.  Secondary to vasovagal/orthostatic hypotension - followed by cardiology    Migraine prevention:  Emgality Migraine rescue:  Ubrelvy '100mg'$  Limit use of pain relievers to no more than 2 days out of week to prevent risk of rebound or medication-overuse headache. Keep headache diary Follow up 9 months.     Subjective:  [name]. [name] is a 66 year old female with fibromyalgia, RA, HTN, IBS, depression, type 2 diabetes and history of L4-S1 decompression who follows up for migraine and recurrent syncope   UPDATE: Doing well Intensity:  moderate to severe Duration:  2 hours Frequency:  average 2 to 3 a month Triggered by stress.  No recent episodes of syncope.  Current NSAIDS/analgesics:  Tylenol, tramadol Current triptans:  none Current ergotamine:  none Current anti-emetic:  none Current muscle relaxants:  Flexeril, Robaxin '500mg'$  Current Antihypertensive medications:  olmesartan Current Antidepressant medications:  Cymbalta '30mg'$  BID Current Anticonvulsant medications:  none Current anti-CGRP:  Emgality, Ubrelvy '100mg'$  Current Vitamins/Herbal/Supplements:  B1, KCl, MVI Current Antihistamines/Decongestants:  Meclizine, Claritin Other therapy:  none Hormone/birth control:  estradiol Other medications:  Plaquenil, alprazolam, Restoril     HISTORY:  In 2021, she was tried on different immunosuppressant therapies for her RA.  In addition to her RA pain, she has fibromyalgia, osteoarthritis, lumbosacral spondylosis and sacroiliac pain, for which she is managed by pain medicine.  She began experiencing daily headaches in July 2021.  The following month, she began experiencing dizziness.  She was found to have a new murmur and was referred to cardiology.  Echocardiogram in August was normal  with EF 60-65%.  Murmur was thought to be secondary to mild aortic valve sclerosis.  The dizziness occurs spontaneously and not positional.  She will experience severe spinning and lightheadedness lasting several minutes and occurring off and on during the day.  It may occur with the headaches but also by itself.  Headaches are described as moderate pressure in a cap distribution, sometimes associated with nausea, photophobia and phonophobia, sometimes flickers or spots in her vision.  She has a heavy foggy feeling in her head.  They last several hours, sometimes all day.  They may occur daily for a couple of weeks and then they may be episodic with one or two headache-free days in between.  On 07/09/2020, she had two syncopal spells that morning.  The first time, she walked into the kitchen and suddenly passed out.  She woke up and started walking out of the kitchen back to bed when she suddenly passed out again.  Witnessed by her daughter.  The events lasted about 30 seconds, no convulsions, tongue biting, palpitations or postictal confusion. However, she did have urinary incontinence.  Blood pressure was reportedly 92/52.  She is currently wearing a cardiac event monitor.  Carotid ultrasound on 07/30/2020 showed no hemodynamically significant ICA stenosis.  She went to the ED on 08/01/2020 for dizziness, left arm tingling and systolic BP 675Q.  CT head personally reviewed was negative for acute abnormalities.  Basic labs and troponin were negative.  EKG showed new RBBB but nothing acute.  She later had MRI and MRA of brain on 08/08/2020  showed mild bilateral mastoid effusions but otherwise unremarkable.  Since then, blood pressure has been elevated.  EEG on 09/03/2020 was normal.  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     She has remote history of migraines in 1999-2000.  They were severe headaches with diaphoresis, nausea, photophobia and  phonophobia.  She was treated with propranolol for a time.  No history of seizures.     Past NSAIDS/analgesics:  none Past abortive triptans:  none Past abortive ergotamine:  none Past muscle relaxants:  none Past anti-emetic:  Zofran, Reglan, Phenergan Past antihypertensive medications:  Lasix Past antidepressant medications:  Amitriptyline, nortriptyline, citalopram Past anticonvulsant medications:  Gabapentin, Lyrica Past anti-CGRP:  none Past vitamins/Herbal/Supplements:  none Past antihistamines/decongestants:  none Other past therapies:  none  PAST MEDICAL HISTORY: Past Medical History:  Diagnosis Date   Allergic rhinitis    Anxiety    Asthma    hx bronchial asthma at times with upper resp infection   Depression    Fibromyalgia    GERD (gastroesophageal reflux disease)    Headache(784.0)    migraine and cluster headaches   Hyperlipemia    Hypertension    IBS (irritable bowel syndrome)    Menopause    Neuromuscular disorder ([place]) 2009   hx of fibromyalgia, polyarthralsia (surgery induced)   Osteoarthritis    Rheumatoid arthritis ([place])    Complicated by osteoarthritis as well.   Rheumatoid arthritis ([place])    Stress incontinence    at times   Tibial fracture 10/14/2016   evulsion periostial right   Type 2 diabetes mellitus with complication, without long-term current use of insulin ([facility])    diet controlled no meds -> however was diagnosed with a diabetic foot ulcer.    MEDICATIONS: Current Outpatient Medications on File Prior to Visit  Medication Sig Dispense Refill   acetaminophen (TYLENOL) 650 MG CR tablet Take by mouth.     albuterol (VENTOLIN HFA) 108 (90 Base) MCG/ACT inhaler      Ca Phosphate-Cholecalciferol (CALTRATE GUMMY BITES PO) Take 2 each 2 (two) times daily by mouth.      calcitonin, salmon, (MIACALCIN/FORTICAL) 200 UNIT/ACT nasal spray Instill 1 spray by nasal route every day, alternating nostrils. 3.7 mL 1   calcitonin, salmon,  (MIACALCIN/FORTICAL) 200 UNIT/ACT nasal spray Place 1 spray into alternate nostrils daily. 3.7 mL 1   Cholecalciferol 125 MCG (5000 UT) TABS take 1 tablet by oral route every day     conjugated estrogens (PREMARIN) vaginal cream INSERT 1 GRAM of cream per vagina THREE TIMES A WEEK BY VAGINAL ROUTE. 30 g 5   Cyanocobalamin 3000 MCG/ML LIQD as directed Sublingual     diclofenac Sodium (VOLTAREN) 1 % GEL Apply 4 g topically 4 (four) times daily.     DULoxetine (CYMBALTA) 30 MG capsule Take 1 capsule (30 mg total) by mouth 2 (two) times daily. 180 capsule 1   Efinaconazole (JUBLIA) 10 % SOLN APPLY TO AFFECTED TOENAILS ONCE DAILY FOR 48 WEEKS 8 mL 11   Emollient (DERMEND BRUISE FORMULA) CREA as directed Externally     estradiol (DOTTI) 0.1 MG/24HR patch Apply 1 patch twice a week by transdermal route. 24 patch 4   ferric derisomaltose (MONOFERRIC) 1000 MG/10ML SOLN injection as directed Intravenous     fluticasone (FLOVENT HFA) 110 MCG/ACT inhaler      furosemide (LASIX) 20 MG tablet Take 1 tablet (20 mg total) by mouth every morning. 90 tablet 0   Galcanezumab-gnlm (EMGALITY) 120 MG/ML SOAJ INJECT 120 MG INTO THE SKIN EVERY 28 (TWENTY-EIGHT) DAYS. 1 mL 11   glucose blood test strip Test blood sugar once daily. Dx Code:  E11.9 100 each 12   hydrALAZINE (APRESOLINE) 25 MG tablet Take 1 tablet (25 mg total) by mouth 2 (two) times daily. Hold if feeling dizzy or SBP<110 mmHg).  Can use additional 25 mg as needed for SBP>150 mmHg 180 tablet 3   hydroxychloroquine (PLAQUENIL) 200 MG tablet Take 2 tablets (400 mg total) by mouth daily. 180 tablet 1   loratadine (CLARITIN) 10 MG tablet 10 mg by oral route.     methocarbamol (ROBAXIN) 500 MG tablet Take 1 tablet (500 mg total) by mouth 4 (four) times daily as needed for muscle spasms. 120 tablet 2   Multiple Vitamins-Minerals (CENTRUM ADULTS) TABS take 2 tablets by oral route  every day with food     mupirocin ointment (BACTROBAN) 2 % Apply to toes as needed 22  g 1   nystatin (MYCOSTATIN/NYSTOP) powder APPLY 1 APPLICATION TOPICALLY 3 (THREE) TIMES DAILY. 15 g 5   olmesartan (BENICAR) 20 MG tablet Take 1 tablet (20 mg total) by mouth daily with supper. 90 tablet 1   pantoprazole (PROTONIX) 40 MG tablet Take 1 tablet (40 mg total) by mouth daily. 90 tablet 3   potassium chloride (KLOR-CON M) 10 MEQ tablet Take 1 tablet (10 mEq total) by mouth daily. 90 tablet 0   pregabalin (LYRICA) 150 MG capsule Take 150 mg by mouth 2 (two) times daily.     rosuvastatin (CRESTOR) 20 MG tablet Take 1 tablet (20 mg total) by mouth daily. 90 tablet 1   Semaglutide, 2 MG/DOSE, (OZEMPIC, 2 MG/DOSE,) 8 MG/3ML SOPN Inject 2 mg as directed once a week. 3 mL 3   temazepam (RESTORIL) 30 MG capsule Take 1 capsule (30 mg total) by mouth at bedtime as needed for sleep. 90 capsule 1   thiamine (VITAMIN B-1) 100 MG tablet Take 100 mg daily by mouth.     Tocilizumab (ACTEMRA) 80 MG/4ML SOLN injection Inject into the vein. 26.88 mL    No current facility-administered medications on file prior to visit.     ALLERGIES: Allergies  Allergen Reactions   Gabapentin Swelling    Pt states she has eye swelling    Isoniazid     Severe SOB   Morphine Hives and Nausea And Vomiting    Allergic to PCA pump only    Nitrofurantoin Shortness Of Breath, Rash and Other (See Comments)    REACTION: welts   Tamiflu [Oseltamivir Phosphate] Nausea And Vomiting    "I vomited within 30 minutes of taking it and was told I cannot ever take it again."   Hctz [Hydrochlorothiazide] Rash   Macrolides And Ketolides Rash   Promethazine Hcl Other (See Comments)    IF GIVEN  IV-hallucinations     CAN TAKE PO PHENERGAN Other reaction(s): Unknown   Roxicet [Oxycodone-Acetaminophen] Itching   Wound Dressing Adhesive Rash    Adhesive Tape    Fentanyl Hives and Rash    REACTION: welts   Sulfamethoxazole-Trimethoprim Rash    FAMILY HISTORY: Family History  Problem Relation Age of Onset   Diabetes  Mother    Stroke Mother    Breast cancer Mother    Atrial fibrillation Mother    Hypertension Mother    Cancer Mother 65       breast   Diabetes Father    Stroke Father 10       2nd 6 month apart   Hypertension Father    Heart attack Father 56       stents   Dementia Father  AAA (abdominal aortic aneurysm) Father 36       repair   CAD Father 53   Thyroid disease Other    Heart disease Other    COPD Other       Objective:  Blood pressure 129/68, pulse 90, height '5\' 11"'$  (1.803 m), weight 204 lb (92.5 kg), SpO2 97 %. General: No acute distress.  Patient appears well-groomed.   Head:  Normocephalic/atraumatic Eyes:  Fundi examined but not visualized Neck: supple, no paraspinal tenderness, full range of motion Heart:  Regular rate and rhythm Neurological Exam: alert and oriented to person, place, and time.  Speech fluent and not dysarthric, language intact.  CN II-XII intact. Bulk and tone normal, muscle strength 5/5 throughout.  Sensation to light touch intact.  Deep tendon reflexes 1+ throughout.  Finger to nose testing intact.  Slightly cautious/antalgic gait.  Romberg negative.   Metta Clines, DO  CC: Roma Schanz, DO

## 2022-06-11 ENCOUNTER — Encounter: Payer: Self-pay | Admitting: Neurology

## 2022-06-11 ENCOUNTER — Telehealth: Payer: Self-pay

## 2022-06-11 ENCOUNTER — Ambulatory Visit: Payer: 59 | Admitting: Neurology

## 2022-06-11 VITALS — BP 129/68 | HR 90 | Ht 71.0 in | Wt 204.0 lb

## 2022-06-11 DIAGNOSIS — G43019 Migraine without aura, intractable, without status migrainosus: Secondary | ICD-10-CM

## 2022-06-11 DIAGNOSIS — G43009 Migraine without aura, not intractable, without status migrainosus: Secondary | ICD-10-CM | POA: Diagnosis not present

## 2022-06-11 DIAGNOSIS — I951 Orthostatic hypotension: Secondary | ICD-10-CM | POA: Diagnosis not present

## 2022-06-11 MED ORDER — UBRELVY 100 MG PO TABS: ORAL_TABLET | ORAL | 5 refills | Status: DC

## 2022-06-11 NOTE — Telephone Encounter (Signed)
Patient seen in the office today, Per patient she will need a PA for urbelvy.  PA team please start a PA

## 2022-06-11 NOTE — Patient Instructions (Signed)
Emgality Roselyn Meier

## 2022-06-16 ENCOUNTER — Other Ambulatory Visit: Payer: Self-pay | Admitting: Family Medicine

## 2022-06-16 ENCOUNTER — Other Ambulatory Visit (HOSPITAL_COMMUNITY): Payer: Self-pay

## 2022-06-16 ENCOUNTER — Other Ambulatory Visit (HOSPITAL_BASED_OUTPATIENT_CLINIC_OR_DEPARTMENT_OTHER): Payer: Self-pay

## 2022-06-16 ENCOUNTER — Telehealth: Payer: Self-pay

## 2022-06-16 MED ORDER — PREGABALIN 150 MG PO CAPS
150.0000 mg | ORAL_CAPSULE | Freq: Two times a day (BID) | ORAL | 2 refills | Status: DC
  Filled 2022-06-23 – 2022-07-22 (×3): qty 60, 30d supply, fill #0

## 2022-06-16 NOTE — Telephone Encounter (Signed)
Patient advised of Approval.  

## 2022-06-16 NOTE — Telephone Encounter (Signed)
Patient Advocate Encounter  Received notification from [employer] that prior authorization is required for Ubrelvy '100MG'$  tablets.  PA submitted and APPROVED on 06-16-2022.  Key [id number]  Effective: 06-16-2022 - 12-15-2022

## 2022-06-16 NOTE — Telephone Encounter (Signed)
Requesting: Lyrica '150mg'$   Contract: None UDS: None Last Visit: 04/22/22 Next Visit:10/22/22 Last Refill: unsure of last refill date  Please Advise

## 2022-06-17 ENCOUNTER — Other Ambulatory Visit (HOSPITAL_BASED_OUTPATIENT_CLINIC_OR_DEPARTMENT_OTHER): Payer: Self-pay

## 2022-06-17 DIAGNOSIS — M5417 Radiculopathy, lumbosacral region: Secondary | ICD-10-CM | POA: Diagnosis not present

## 2022-06-17 DIAGNOSIS — M797 Fibromyalgia: Secondary | ICD-10-CM | POA: Diagnosis not present

## 2022-06-17 DIAGNOSIS — M0609 Rheumatoid arthritis without rheumatoid factor, multiple sites: Secondary | ICD-10-CM | POA: Diagnosis not present

## 2022-06-17 DIAGNOSIS — M7918 Myalgia, other site: Secondary | ICD-10-CM | POA: Diagnosis not present

## 2022-06-17 DIAGNOSIS — M2550 Pain in unspecified joint: Secondary | ICD-10-CM | POA: Diagnosis not present

## 2022-06-17 DIAGNOSIS — M5416 Radiculopathy, lumbar region: Secondary | ICD-10-CM | POA: Diagnosis not present

## 2022-06-17 DIAGNOSIS — G894 Chronic pain syndrome: Secondary | ICD-10-CM | POA: Diagnosis not present

## 2022-06-17 DIAGNOSIS — Z981 Arthrodesis status: Secondary | ICD-10-CM | POA: Diagnosis not present

## 2022-06-17 DIAGNOSIS — M533 Sacrococcygeal disorders, not elsewhere classified: Secondary | ICD-10-CM | POA: Diagnosis not present

## 2022-06-17 DIAGNOSIS — M461 Sacroiliitis, not elsewhere classified: Secondary | ICD-10-CM | POA: Diagnosis not present

## 2022-06-17 MED ORDER — PREGABALIN 150 MG PO CAPS
150.0000 mg | ORAL_CAPSULE | Freq: Three times a day (TID) | ORAL | 2 refills | Status: DC
  Filled 2022-06-17: qty 90, 30d supply, fill #0
  Filled 2022-07-13 – 2022-07-19 (×2): qty 90, 30d supply, fill #1
  Filled 2022-08-17: qty 90, 30d supply, fill #2

## 2022-06-21 ENCOUNTER — Encounter: Payer: Self-pay | Admitting: Family Medicine

## 2022-06-21 ENCOUNTER — Other Ambulatory Visit (HOSPITAL_COMMUNITY): Payer: Self-pay

## 2022-06-21 ENCOUNTER — Ambulatory Visit: Payer: 59 | Admitting: Family Medicine

## 2022-06-21 VITALS — BP 134/70 | HR 84 | Temp 98.4°F | Resp 18 | Ht 71.0 in | Wt 204.4 lb

## 2022-06-21 DIAGNOSIS — R197 Diarrhea, unspecified: Secondary | ICD-10-CM

## 2022-06-21 LAB — COMPREHENSIVE METABOLIC PANEL
ALT: 48 U/L — ABNORMAL HIGH (ref 0–35)
AST: 44 U/L — ABNORMAL HIGH (ref 0–37)
Albumin: 4.2 g/dL (ref 3.5–5.2)
Alkaline Phosphatase: 83 U/L (ref 39–117)
BUN: 10 mg/dL (ref 6–23)
CO2: 30 mEq/L (ref 19–32)
Calcium: 9.2 mg/dL (ref 8.4–10.5)
Chloride: 103 mEq/L (ref 96–112)
Creatinine, Ser: 0.77 mg/dL (ref 0.40–1.20)
GFR: 80.93 mL/min (ref >60.00)
Glucose, Bld: 85 mg/dL (ref 70–99)
Potassium: 3.7 mEq/L (ref 3.5–5.1)
Sodium: 142 mEq/L (ref 135–145)
Total Bilirubin: 0.7 mg/dL (ref 0.2–1.2)
Total Protein: 6.5 g/dL (ref 6.0–8.3)

## 2022-06-21 MED ORDER — METRONIDAZOLE 500 MG PO TABS: 500.0000 mg | ORAL_TABLET | Freq: Three times a day (TID) | ORAL | 0 refills | Status: AC

## 2022-06-21 NOTE — Progress Notes (Addendum)
Subjective:   By signing my name below, I, [name], attest that this documentation has been prepared under the direction and in the presence of [name] DO 06/21/2022   Patient ID: [name], female    DOB: [birth year], 66 y.o.   MRN: [id number]  Chief Complaint  Patient presents with   Diarrhea    Since 11/24. Pt states having explosive diarrhea and has accidents. No bleeding. Pt states no but cramping.     HPI Patient is in today for an office visit  She complains of explosive diarrhea with associated symptoms of flatulence. She states that the stool is smaller in diameter and clay in color. She also states that it is difficult for her to make it to the restroom without releasing some stools. She states of episodes where she released bowels onto her bed. She notes that she received antibiotics for a cough on 05/23/2022 and was prescribed 875-125 mg of Amoxicillin, 20 mg of Prednisone, and 6.25-15 mg/5 mL of Promethazine-DM. She completed her antibiotics for about 7 days. She also notes of consuming cheese and since 06/04/2022, has been experiencing her diarrhea symptoms. She denies of any abdominal. She is taking Imodium for her symptoms and states that her symptoms are improving. She states that the last time symptoms appeared was the night of 06/20/2022.   Past Medical History:  Diagnosis Date   Allergic rhinitis    Anxiety    Asthma    hx bronchial asthma at times with upper resp infection   Depression    Fibromyalgia    GERD (gastroesophageal reflux disease)    Headache(784.0)    migraine and cluster headaches   Hyperlipemia    Hypertension    IBS (irritable bowel syndrome)    Menopause    Neuromuscular disorder ([place]) 2009   hx of fibromyalgia, polyarthralsia (surgery induced)   Osteoarthritis    Rheumatoid arthritis ([place])    Complicated by osteoarthritis as well.   Rheumatoid arthritis ([place])    Stress incontinence    at times   Tibial  fracture 10/14/2016   evulsion periostial right   Type 2 diabetes mellitus with complication, without long-term current use of insulin (HCC)    diet controlled no meds -> however was diagnosed with a diabetic foot ulcer.    Past Surgical History:  Procedure Laterality Date   ABDOMINAL HYSTERECTOMY  12/1999   complete   CARPAL TUNNEL RELEASE Right 12/27/2017   Procedure: CARPAL TUNNEL RELEASE;  Surgeon: Roseanne Kaufman, MD;  Location: [facility];  Service: Orthopedics;  Laterality: Right;   CERVICAL CONIZATION W/BX  june 1990   dysplasia of cervix/used 5Fu cream for 3 months   CERVICAL LAMINECTOMY  2005 & 2009   X 2   NO ROM [place]   X 2   CHOLECYSTECTOMY  1986   DILATION AND CURETTAGE OF UTERUS   [phone]   missed abortion   ESOPHAGOGASTRODUODENOSCOPY  06/29/2011   Procedure: ESOPHAGOGASTRODUODENOSCOPY (EGD);  Surgeon: Shann Medal, MD;  Location: [facility];  Service: General;  Laterality: N/A;   FOOT SURGERY  2005   RT HEEL   GASTRIC BANDING PORT REVISION  09/24/2011   Procedure: GASTRIC BANDING PORT REVISION;  Surgeon: Pedro Earls, MD;  Location: [facility];  Service: General;  Laterality: N/A;   GASTRIC ROUX-EN-Y N/A 06/06/2017   Procedure: Conversion from [place] to [place];  Surgeon: Johnathan Hausen,  MD;  Location: [facility];  Service: General;  Laterality: N/A;   [place]  12/30/09   LAPAROSCOPIC GASTRIC SLEEVE RESECTION N/A 07/02/2013   Procedure: LAPAROSCOPIC GASTRIC SLEEVE RESECTION upper endoscopy;  Surgeon: Pedro Earls, MD;  Location: [facility];  Service: General;  Laterality: N/A;   LAPAROSCOPIC REPAIR AND REMOVAL OF GASTRIC BAND N/A 07/02/2013   Procedure: LAPAROSCOPIC REMOVAL OF GASTRIC BAND;  Surgeon: Pedro Earls, MD;  Location: [facility];  Service: General;  Laterality: N/A;   LAPAROSCOPIC REVISION OF GASTRIC BAND  07/03/2012   Procedure:  LAPAROSCOPIC REVISION OF GASTRIC BAND;  Surgeon: Pedro Earls, MD;  Location: [facility];  Service: General;  Laterality: N/A;  removal of old lap. band port, replaced with AP standard band   LAPAROSCOPY  09/24/2011   Procedure: LAPAROSCOPY DIAGNOSTIC;  Surgeon: Pedro Earls, MD;  Location: [facility];  Service: General;  Laterality: N/A;   LAPAROSCOPY  07/03/2012   Procedure: LAPAROSCOPY DIAGNOSTIC;  Surgeon: Pedro Earls, MD;  Location: [facility];  Service: General;  Laterality: N/A;  Exploratory Laparoscopy    MESH APPLIED TO LAP PORT  07/03/2012   Procedure: MESH APPLIED TO LAP PORT;  Surgeon: Pedro Earls, MD;  Location: [facility];  Service: General;  Laterality: N/A;   [place]   X 2   right knee  1981   ARTHROSCOPY AND ARTHROTOMY   right knee arthroscopy and arthrotomy  12-1979   TONSILLECTOMY  1971   TUBAL LIGATION  1993   WITH C -SECTION    Family History  Problem Relation Age of Onset   Diabetes Mother    Stroke Mother    Breast cancer Mother    Atrial fibrillation Mother    Hypertension Mother    Cancer Mother 66       breast   Diabetes Father    Stroke Father 49       2nd 76 month apart   Hypertension Father    Heart attack Father 32       stents   Dementia Father    AAA (abdominal aortic aneurysm) Father 74       repair   CAD Father 3   Thyroid disease Other    Heart disease Other    COPD Other     Social History   Socioeconomic History   Marital status: Married    Spouse name: Pascha Fogal   Number of children: 2   Years of education: Not on file   Highest education level: Not on file  Occupational History   Occupation: Programmer, multimedia: [employer]  Tobacco Use   Smoking status: Never   Smokeless tobacco: Never  Vaping Use   Vaping Use: Never used  Substance and Sexual Activity   Alcohol use: Not Currently   Drug use: Never   Sexual activity: Yes    Birth control/protection: None  Other Topics Concern    Not on file  Social History Narrative   Right handed   Two story home   Drinks caffeine occasionally   Social Determinants of Health   Financial Resource Strain: Not on file  Food Insecurity: Not on file  Transportation Needs: Not on file  Physical Activity: Not on file  Stress: Not on file  Social Connections: Not on file  Intimate Partner Violence: Not on file    Outpatient Medications Prior to Visit  Medication  Sig Dispense Refill   acetaminophen (TYLENOL) 650 MG CR tablet Take by mouth.     albuterol (VENTOLIN HFA) 108 (90 Base) MCG/ACT inhaler      Ca Phosphate-Cholecalciferol (CALTRATE GUMMY BITES PO) Take 2 each 2 (two) times daily by mouth.      calcitonin, salmon, (MIACALCIN/FORTICAL) 200 UNIT/ACT nasal spray Instill 1 spray by nasal route every day, alternating nostrils. 3.7 mL 1   calcitonin, salmon, (MIACALCIN/FORTICAL) 200 UNIT/ACT nasal spray Place 1 spray into alternate nostrils daily. 3.7 mL 1   Cholecalciferol 125 MCG (5000 UT) TABS take 1 tablet by oral route every day     conjugated estrogens (PREMARIN) vaginal cream INSERT 1 GRAM of cream per vagina THREE TIMES A WEEK BY VAGINAL ROUTE. 30 g 5   Cyanocobalamin 3000 MCG/ML LIQD as directed Sublingual     diclofenac Sodium (VOLTAREN) 1 % GEL Apply 4 g topically 4 (four) times daily.     DULoxetine (CYMBALTA) 30 MG capsule Take 1 capsule (30 mg total) by mouth 2 (two) times daily. 180 capsule 1   Efinaconazole (JUBLIA) 10 % SOLN APPLY TO AFFECTED TOENAILS ONCE DAILY FOR 48 WEEKS 8 mL 11   Emollient (DERMEND BRUISE FORMULA) CREA as directed Externally     estradiol (DOTTI) 0.1 MG/24HR patch Apply 1 patch twice a week by transdermal route. 24 patch 4   ferric derisomaltose (MONOFERRIC) 1000 MG/10ML SOLN injection as directed Intravenous     fluticasone (FLOVENT HFA) 110 MCG/ACT inhaler      furosemide (LASIX) 20 MG tablet Take 1 tablet (20 mg total) by mouth every morning. 90 tablet 0   Galcanezumab-gnlm (EMGALITY)  120 MG/ML SOAJ INJECT 120 MG INTO THE SKIN EVERY 28 (TWENTY-EIGHT) DAYS. 1 mL 11   glucose blood test strip Test blood sugar once daily. Dx Code: E11.9 100 each 12   hydrALAZINE (APRESOLINE) 25 MG tablet Take 1 tablet (25 mg total) by mouth 2 (two) times daily. Hold if feeling dizzy or SBP<110 mmHg).  Can use additional 25 mg as needed for SBP>150 mmHg 180 tablet 3   hydroxychloroquine (PLAQUENIL) 200 MG tablet Take 2 tablets (400 mg total) by mouth daily. 180 tablet 1   loratadine (CLARITIN) 10 MG tablet 10 mg by oral route.     methocarbamol (ROBAXIN) 500 MG tablet Take 1 tablet (500 mg total) by mouth 4 (four) times daily as needed for muscle spasms. 120 tablet 2   Multiple Vitamins-Minerals (CENTRUM ADULTS) TABS take 2 tablets by oral route  every day with food     mupirocin ointment (BACTROBAN) 2 % Apply to toes as needed 22 g 1   nystatin (MYCOSTATIN/NYSTOP) powder APPLY 1 APPLICATION TOPICALLY 3 (THREE) TIMES DAILY. 15 g 5   olmesartan (BENICAR) 20 MG tablet Take 1 tablet (20 mg total) by mouth daily with supper. 90 tablet 1   pantoprazole (PROTONIX) 40 MG tablet Take 1 tablet (40 mg total) by mouth daily. 90 tablet 3   potassium chloride (KLOR-CON M) 10 MEQ tablet Take 1 tablet (10 mEq total) by mouth daily. 90 tablet 0   pregabalin (LYRICA) 150 MG capsule Take 1 capsule (150 mg total) by mouth 2 (two) times daily. 60 capsule 2   pregabalin (LYRICA) 150 MG capsule Take 1 capsule (150 mg total) by mouth 3 (three) times daily. Max Daily Amount: 450 mg. 90 capsule 2   rosuvastatin (CRESTOR) 20 MG tablet Take 1 tablet (20 mg total) by mouth daily. 90 tablet 1   Semaglutide, 2  MG/DOSE, (OZEMPIC, 2 MG/DOSE,) 8 MG/3ML SOPN Inject 2 mg as directed once a week. 3 mL 3   temazepam (RESTORIL) 30 MG capsule Take 1 capsule (30 mg total) by mouth at bedtime as needed for sleep. 90 capsule 1   thiamine (VITAMIN B-1) 100 MG tablet Take 100 mg daily by mouth.     Tocilizumab (ACTEMRA) 80 MG/4ML SOLN  injection Inject into the vein. 26.88 mL    Ubrogepant (UBRELVY) 100 MG TABS TAKE ONE TABLET BY MOUTH DAILY AS NEEDED FOR MIGRAINE. MAX 2 TABLETS IN 24 HOURS 16 tablet 5   No facility-administered medications prior to visit.    Allergies  Allergen Reactions   Gabapentin Swelling    Pt states she has eye swelling    Isoniazid     Severe SOB   Morphine Hives and Nausea And Vomiting    Allergic to PCA pump only    Nitrofurantoin Shortness Of Breath, Rash and Other (See Comments)    REACTION: welts   Tamiflu [Oseltamivir Phosphate] Nausea And Vomiting    "I vomited within 30 minutes of taking it and was told I cannot ever take it again."   Hctz [Hydrochlorothiazide] Rash   Macrolides And Ketolides Rash   Promethazine Hcl Other (See Comments)    IF GIVEN  IV-hallucinations     CAN TAKE PO PHENERGAN Other reaction(s): Unknown   Roxicet [Oxycodone-Acetaminophen] Itching   Wound Dressing Adhesive Rash    Adhesive Tape    Fentanyl Hives and Rash    REACTION: welts   Sulfamethoxazole-Trimethoprim Rash    Review of Systems  Constitutional:  Negative for fever and malaise/fatigue.  HENT:  Negative for congestion.   Eyes:  Negative for blurred vision.  Respiratory:  Negative for cough and shortness of breath.   Cardiovascular:  Negative for chest pain, palpitations and leg swelling.  Gastrointestinal:  Positive for diarrhea. Negative for abdominal pain and vomiting.       (+) Flatulence  Musculoskeletal:  Negative for back pain.  Skin:  Negative for rash.  Neurological:  Negative for loss of consciousness and headaches.       Objective:    Physical Exam Vitals and nursing note reviewed.  Constitutional:      General: She is not in acute distress.    Appearance: Normal appearance. She is not ill-appearing.  HENT:     Head: Normocephalic and atraumatic.     Right Ear: External ear normal.     Left Ear: External ear normal.  Eyes:     Extraocular Movements: Extraocular  movements intact.     Pupils: Pupils are equal, round, and reactive to light.  Cardiovascular:     Rate and Rhythm: Normal rate and regular rhythm.     Heart sounds: Normal heart sounds. No murmur heard.    No gallop.  Pulmonary:     Effort: Pulmonary effort is normal. No respiratory distress.     Breath sounds: Normal breath sounds. No wheezing or rales.  Abdominal:     General: Bowel sounds are normal. There is no distension.     Palpations: Abdomen is soft.     Tenderness: There is abdominal tenderness in the epigastric area. There is no guarding.     Comments: Mid Epigastric Area  Skin:    General: Skin is warm and dry.  Neurological:     Mental Status: She is alert and oriented to person, place, and time.  Psychiatric:        Mood and  Affect: Mood normal.        Behavior: Behavior normal.        Thought Content: Thought content normal.        Judgment: Judgment normal.     BP 134/70 (BP Location: Left Arm, Patient Position: Sitting, Cuff Size: Large)   Pulse 84   Temp 98.4 F (36.9 C) (Oral)   Resp 18   Ht '5\' 11"'$  (1.803 m)   Wt 204 lb 6.4 oz (92.7 kg)   SpO2 98%   BMI 28.51 kg/m  Wt Readings from Last 3 Encounters:  06/21/22 204 lb 6.4 oz (92.7 kg)  06/11/22 204 lb (92.5 kg)  05/23/22 205 lb 7.5 oz (93.2 kg)    Diabetic Foot Exam - Simple   No data filed    Lab Results  Component Value Date   WBC 7.6 02/03/2022   HGB 13.5 02/03/2022   HCT 41.2 02/03/2022   PLT 151 02/03/2022   GLUCOSE 95 10/22/2021   CHOL 119 04/22/2022   TRIG 68.0 04/22/2022   HDL 60.60 04/22/2022   LDLCALC 45 04/22/2022   ALT 30 10/22/2021   AST 29 10/22/2021   NA 139 10/22/2021   K 4.3 10/22/2021   CL 101 10/22/2021   CREATININE 0.82 10/22/2021   BUN 11 10/22/2021   CO2 33 (H) 10/22/2021   TSH 1.67 10/22/2021   HGBA1C 5.9 04/22/2022   MICROALBUR <0.7 10/22/2021    Lab Results  Component Value Date   TSH 1.67 10/22/2021   Lab Results  Component Value Date   WBC 7.6  02/03/2022   HGB 13.5 02/03/2022   HCT 41.2 02/03/2022   MCV 94.5 02/03/2022   PLT 151 02/03/2022   Lab Results  Component Value Date   NA 139 10/22/2021   K 4.3 10/22/2021   CO2 33 (H) 10/22/2021   GLUCOSE 95 10/22/2021   BUN 11 10/22/2021   CREATININE 0.82 10/22/2021   BILITOT 1.0 10/22/2021   ALKPHOS 82 10/22/2021   AST 29 10/22/2021   ALT 30 10/22/2021   PROT 6.6 10/22/2021   ALBUMIN 4.4 10/22/2021   CALCIUM 9.6 10/22/2021   ANIONGAP 7 06/09/2021   GFR 75.39 10/22/2021   Lab Results  Component Value Date   CHOL 119 04/22/2022   Lab Results  Component Value Date   HDL 60.60 04/22/2022   Lab Results  Component Value Date   LDLCALC 45 04/22/2022   Lab Results  Component Value Date   TRIG 68.0 04/22/2022   Lab Results  Component Value Date   CHOLHDL 2 04/22/2022   Lab Results  Component Value Date   HGBA1C 5.9 04/22/2022       Assessment & Plan:   Problem List Items Addressed This Visit       Unprioritized   DIARRHEA - Primary   Relevant Medications   metroNIDAZOLE (FLAGYL) 500 MG tablet   Other Relevant Orders   Fecal occult blood, imunochemical   Cdiff NAA+O+P+Stool Culture   Comprehensive metabolic panel   CBC+Platelet+Hem Review   Meds ordered this encounter  Medications   metroNIDAZOLE (FLAGYL) 500 MG tablet    Sig: Take 1 tablet (500 mg total) by mouth 3 (three) times daily for 7 days.    Dispense:  30 tablet    Refill:  0    I, [name], DO, personally preformed the services described in this documentation.  All medical record entries made by the scribe were at my direction and in my presence.  I have reviewed the chart and discharge instructions (if applicable) and agree that the record reflects my personal performance and is accurate and complete. 06/21/2022   I,Amber Collins,acting as a scribe for [name], DO.,have documented all relevant documentation on the behalf of [name], DO,as directed  by  [name], DO while in the presence of [name], DO.    [name], DO

## 2022-06-21 NOTE — Patient Instructions (Signed)
Diarrhea, Adult Diarrhea is frequent loose and sometimes watery bowel movements. Diarrhea can make you feel weak and cause you to become dehydrated. Dehydration is a condition in which there is not enough water or other fluids in the body. Dehydration can make you tired and thirsty, cause you to have a dry mouth, and decrease how often you urinate. Diarrhea typically lasts 2-3 days. However, it can last longer if it is a sign of something more serious. It is important to treat your diarrhea as told by your health care provider. Follow these instructions at home: Eating and drinking     Follow these recommendations as told by your health care provider: Take an oral rehydration solution (ORS). This is an over-the-counter medicine that helps return your body to its normal balance of nutrients and water. It is found at pharmacies and retail stores. Drink enough fluid to keep your urine pale yellow. Drink fluids such as water, diluted fruit juice, and low-calorie sports drinks. You can drink milk also, if desired. Sucking on ice chips is another way to get fluids. Avoid drinking fluids that contain a lot of sugar or caffeine, such as soda, energy drinks, and regular sports drinks. Avoid alcohol. Eat bland, easy-to-digest foods in small amounts as you are able. These foods include bananas, applesauce, rice, lean meats, toast, and crackers. Avoid spicy or fatty foods.  Medicines Take over-the-counter and prescription medicines only as told by your health care provider. If you were prescribed antibiotics, take them as told by your health care provider. Do not stop using the antibiotic even if you start to feel better. General instructions  Wash your hands often using soap and water for at least 20 seconds. If soap and water are not available, use hand sanitizer. Others in the household should wash their hands as well. Hands should be washed: After using the toilet or changing a diaper. Before  preparing, cooking, or serving food. While caring for a sick person or while visiting someone in a hospital. Rest at home while you recover. Take a warm bath to relieve any burning or pain from frequent diarrhea episodes. Watch your condition for any changes. Contact a health care provider if: You have a fever. Your diarrhea gets worse. You have new symptoms. You vomit every time you eat or drink. You feel light-headed, dizzy, or have a headache. You have muscle cramps. You have signs of dehydration, such as: Dark urine, very little urine, or no urine. Cracked lips. Dry mouth. Sunken eyes. Sleepiness. Weakness. You have bloody or black stools or stools that look like tar. You have severe pain, cramping, or bloating in your abdomen. Your skin feels cold and clammy. You feel confused. Get help right away if: You have chest pain or your heart is beating very quickly. You have trouble breathing or you are breathing very quickly. You feel extremely weak or you faint. These symptoms may be an emergency. Get help right away. Call 911. Do not wait to see if the symptoms will go away. Do not drive yourself to the hospital. This information is not intended to replace advice given to you by your health care provider. Make sure you discuss any questions you have with your health care provider. Document Revised: 12/15/2021 Document Reviewed: 12/15/2021 Elsevier Patient Education  2023 [employer]  

## 2022-06-22 ENCOUNTER — Other Ambulatory Visit (HOSPITAL_BASED_OUTPATIENT_CLINIC_OR_DEPARTMENT_OTHER): Payer: Self-pay

## 2022-06-22 ENCOUNTER — Other Ambulatory Visit: Payer: Self-pay | Admitting: Cardiology

## 2022-06-23 ENCOUNTER — Other Ambulatory Visit: Payer: Self-pay

## 2022-06-23 ENCOUNTER — Other Ambulatory Visit (INDEPENDENT_AMBULATORY_CARE_PROVIDER_SITE_OTHER): Payer: 59

## 2022-06-23 ENCOUNTER — Other Ambulatory Visit (HOSPITAL_BASED_OUTPATIENT_CLINIC_OR_DEPARTMENT_OTHER): Payer: Self-pay

## 2022-06-23 DIAGNOSIS — R197 Diarrhea, unspecified: Secondary | ICD-10-CM

## 2022-06-23 LAB — FECAL OCCULT BLOOD, IMMUNOCHEMICAL: Fecal Occult Bld: NEGATIVE

## 2022-06-23 MED ORDER — OLMESARTAN MEDOXOMIL 20 MG PO TABS
20.0000 mg | ORAL_TABLET | Freq: Every day | ORAL | 1 refills | Status: DC
  Filled 2022-06-23: qty 90, 90d supply, fill #0

## 2022-06-23 NOTE — Addendum Note (Signed)
Addended by: Kelle Darting A on: 06/23/2022 10:40 AM   Modules accepted: Orders

## 2022-06-24 ENCOUNTER — Encounter: Payer: Self-pay | Admitting: Family

## 2022-06-24 ENCOUNTER — Other Ambulatory Visit: Payer: Self-pay | Admitting: Family Medicine

## 2022-06-24 ENCOUNTER — Encounter: Payer: Self-pay | Admitting: Family Medicine

## 2022-06-24 ENCOUNTER — Other Ambulatory Visit (HOSPITAL_BASED_OUTPATIENT_CLINIC_OR_DEPARTMENT_OTHER): Payer: Self-pay

## 2022-06-24 DIAGNOSIS — M0579 Rheumatoid arthritis with rheumatoid factor of multiple sites without organ or systems involvement: Secondary | ICD-10-CM

## 2022-06-24 LAB — CBC+PLATELET+HEM REVIEW
BASO(ABSOLUTE): 0.1 10*3/uL (ref 0.0–0.2)
Basophils Manual: 1 %
EOS (ABSOLUTE VALUE): 0.3 10*3/uL (ref 0.0–0.4)
Eosinophils Manual: 5 %
Hematocrit: 40.6 % (ref 34.0–46.6)
Hemoglobin: 13.8 g/dL (ref 11.1–15.9)
Lymphocytes Absolute: 1.4 10*3/uL (ref 0.7–3.1)
Lymphocytes Manual: 26 %
MCH: 30.5 pg (ref 26.6–33.0)
MCHC: 34 g/dL (ref 31.5–35.7)
MCV: 90 fL (ref 79–97)
Monocytes Manual: 11 %
Monocytes(Absolute): 0.6 10*3/uL (ref 0.1–0.9)
Neutrophils Absolute: 2.4 10*3/uL (ref 1.4–7.0)
Neutrophils Manual: 47 %
PLATELET COMMENT: ADEQUATE
Platelets: 140 10*3/uL — ABNORMAL LOW (ref 150–450)
RBC COMMENT: NORMAL
RBC: 4.52 x10E6/uL (ref 3.77–5.28)
RDW: 11.9 % (ref 11.7–15.4)
WBC: 5.2 10*3/uL (ref 3.4–10.8)

## 2022-06-24 MED ORDER — CYCLOBENZAPRINE HCL 10 MG PO TABS: 10.0000 mg | ORAL_TABLET | Freq: Three times a day (TID) | ORAL | 1 refills | Status: DC | PRN

## 2022-06-25 ENCOUNTER — Other Ambulatory Visit: Payer: Self-pay

## 2022-06-25 ENCOUNTER — Other Ambulatory Visit (HOSPITAL_BASED_OUTPATIENT_CLINIC_OR_DEPARTMENT_OTHER): Payer: Self-pay

## 2022-06-25 ENCOUNTER — Other Ambulatory Visit (HOSPITAL_BASED_OUTPATIENT_CLINIC_OR_DEPARTMENT_OTHER): Payer: Self-pay | Admitting: Obstetrics and Gynecology

## 2022-06-25 DIAGNOSIS — Z1231 Encounter for screening mammogram for malignant neoplasm of breast: Secondary | ICD-10-CM

## 2022-06-26 ENCOUNTER — Encounter: Payer: Self-pay | Admitting: Family

## 2022-06-28 ENCOUNTER — Other Ambulatory Visit (HOSPITAL_BASED_OUTPATIENT_CLINIC_OR_DEPARTMENT_OTHER): Payer: Self-pay

## 2022-06-28 ENCOUNTER — Telehealth: Payer: Self-pay | Admitting: *Deleted

## 2022-06-28 DIAGNOSIS — M1990 Unspecified osteoarthritis, unspecified site: Secondary | ICD-10-CM | POA: Diagnosis not present

## 2022-06-28 DIAGNOSIS — M8589 Other specified disorders of bone density and structure, multiple sites: Secondary | ICD-10-CM | POA: Diagnosis not present

## 2022-06-28 DIAGNOSIS — Z79899 Other long term (current) drug therapy: Secondary | ICD-10-CM | POA: Diagnosis not present

## 2022-06-28 DIAGNOSIS — M2550 Pain in unspecified joint: Secondary | ICD-10-CM | POA: Diagnosis not present

## 2022-06-28 DIAGNOSIS — G8929 Other chronic pain: Secondary | ICD-10-CM | POA: Diagnosis not present

## 2022-06-28 DIAGNOSIS — M0609 Rheumatoid arthritis without rheumatoid factor, multiple sites: Secondary | ICD-10-CM | POA: Diagnosis not present

## 2022-06-28 DIAGNOSIS — M549 Dorsalgia, unspecified: Secondary | ICD-10-CM | POA: Diagnosis not present

## 2022-06-28 DIAGNOSIS — R748 Abnormal levels of other serum enzymes: Secondary | ICD-10-CM | POA: Diagnosis not present

## 2022-06-28 DIAGNOSIS — M79643 Pain in unspecified hand: Secondary | ICD-10-CM | POA: Diagnosis not present

## 2022-06-28 DIAGNOSIS — M797 Fibromyalgia: Secondary | ICD-10-CM | POA: Diagnosis not present

## 2022-06-28 LAB — HM DEXA SCAN

## 2022-06-28 MED ORDER — HYOSCYAMINE SULFATE SL 0.125 MG SL SUBL
1.0000 | SUBLINGUAL_TABLET | Freq: Four times a day (QID) | SUBLINGUAL | 0 refills | Status: DC | PRN
  Filled 2022-06-28: qty 30, 7d supply, fill #0

## 2022-06-28 NOTE — Telephone Encounter (Signed)
Patient called about lab results for stool.  I advised her that they were not completed yet and we only have preliminary results.   She states that she is still having explosive diarrhea.  She is still going about 4-5 times a day.  She has been taking Immodium with no relief. She is tired of this.  She also went to her rheumatologist and they did a bone density test.

## 2022-06-28 NOTE — Telephone Encounter (Signed)
Patient notified and rx sent in 

## 2022-06-30 LAB — CDIFF NAA+O+P+STOOL CULTURE
E coli, Shiga toxin Assay: NEGATIVE
Toxigenic C. Difficile by PCR: NEGATIVE

## 2022-07-06 ENCOUNTER — Other Ambulatory Visit (HOSPITAL_BASED_OUTPATIENT_CLINIC_OR_DEPARTMENT_OTHER): Payer: Self-pay

## 2022-07-06 ENCOUNTER — Other Ambulatory Visit: Payer: Self-pay

## 2022-07-13 ENCOUNTER — Encounter: Payer: Self-pay | Admitting: Family

## 2022-07-13 ENCOUNTER — Other Ambulatory Visit (HOSPITAL_BASED_OUTPATIENT_CLINIC_OR_DEPARTMENT_OTHER): Payer: Self-pay

## 2022-07-13 ENCOUNTER — Other Ambulatory Visit: Payer: Self-pay

## 2022-07-14 DIAGNOSIS — L821 Other seborrheic keratosis: Secondary | ICD-10-CM | POA: Diagnosis not present

## 2022-07-14 DIAGNOSIS — D225 Melanocytic nevi of trunk: Secondary | ICD-10-CM | POA: Diagnosis not present

## 2022-07-14 DIAGNOSIS — L570 Actinic keratosis: Secondary | ICD-10-CM | POA: Diagnosis not present

## 2022-07-14 DIAGNOSIS — L814 Other melanin hyperpigmentation: Secondary | ICD-10-CM | POA: Diagnosis not present

## 2022-07-14 DIAGNOSIS — L578 Other skin changes due to chronic exposure to nonionizing radiation: Secondary | ICD-10-CM | POA: Diagnosis not present

## 2022-07-16 ENCOUNTER — Encounter: Payer: Self-pay | Admitting: Family

## 2022-07-19 ENCOUNTER — Other Ambulatory Visit: Payer: Self-pay

## 2022-07-21 DIAGNOSIS — M793 Panniculitis, unspecified: Secondary | ICD-10-CM | POA: Diagnosis not present

## 2022-07-21 DIAGNOSIS — Z9884 Bariatric surgery status: Secondary | ICD-10-CM | POA: Diagnosis not present

## 2022-07-22 ENCOUNTER — Other Ambulatory Visit: Payer: Self-pay

## 2022-07-22 ENCOUNTER — Other Ambulatory Visit (HOSPITAL_BASED_OUTPATIENT_CLINIC_OR_DEPARTMENT_OTHER): Payer: Self-pay

## 2022-07-23 ENCOUNTER — Ambulatory Visit: Payer: Commercial Managed Care - PPO | Attending: Cardiology | Admitting: Cardiology

## 2022-07-23 ENCOUNTER — Encounter: Payer: Self-pay | Admitting: Family

## 2022-07-23 ENCOUNTER — Encounter: Payer: Self-pay | Admitting: Cardiology

## 2022-07-23 ENCOUNTER — Other Ambulatory Visit (HOSPITAL_BASED_OUTPATIENT_CLINIC_OR_DEPARTMENT_OTHER): Payer: Self-pay

## 2022-07-23 VITALS — BP 114/52 | HR 78 | Ht 71.0 in | Wt 201.2 lb

## 2022-07-23 DIAGNOSIS — Z8249 Family history of ischemic heart disease and other diseases of the circulatory system: Secondary | ICD-10-CM | POA: Diagnosis not present

## 2022-07-23 DIAGNOSIS — R55 Syncope and collapse: Secondary | ICD-10-CM

## 2022-07-23 DIAGNOSIS — R0989 Other specified symptoms and signs involving the circulatory and respiratory systems: Secondary | ICD-10-CM | POA: Diagnosis not present

## 2022-07-23 DIAGNOSIS — Z01818 Encounter for other preprocedural examination: Secondary | ICD-10-CM | POA: Diagnosis not present

## 2022-07-23 DIAGNOSIS — I951 Orthostatic hypotension: Secondary | ICD-10-CM

## 2022-07-23 DIAGNOSIS — I771 Stricture of artery: Secondary | ICD-10-CM

## 2022-07-23 DIAGNOSIS — E785 Hyperlipidemia, unspecified: Secondary | ICD-10-CM

## 2022-07-23 DIAGNOSIS — I350 Nonrheumatic aortic (valve) stenosis: Secondary | ICD-10-CM

## 2022-07-23 DIAGNOSIS — Z9884 Bariatric surgery status: Secondary | ICD-10-CM

## 2022-07-23 DIAGNOSIS — E1169 Type 2 diabetes mellitus with other specified complication: Secondary | ICD-10-CM

## 2022-07-23 MED ORDER — HYDRALAZINE HCL 25 MG PO TABS
25.0000 mg | ORAL_TABLET | Freq: Two times a day (BID) | ORAL | 3 refills | Status: DC
  Filled 2022-07-23 – 2022-09-30 (×2): qty 180, 90d supply, fill #0
  Filled 2022-12-23 (×2): qty 180, 90d supply, fill #1
  Filled 2023-03-26: qty 180, 90d supply, fill #2
  Filled 2023-06-22: qty 180, 90d supply, fill #3

## 2022-07-23 MED ORDER — OLMESARTAN MEDOXOMIL 20 MG PO TABS
20.0000 mg | ORAL_TABLET | Freq: Every day | ORAL | 3 refills | Status: DC
  Filled 2022-07-23 – 2022-09-21 (×2): qty 90, 90d supply, fill #0
  Filled 2022-12-17: qty 90, 90d supply, fill #1
  Filled 2023-03-17: qty 90, 90d supply, fill #2

## 2022-07-23 NOTE — Progress Notes (Signed)
Primary Care Provider: Carollee Herter, [place] Cardiologist: Glenetta Hew, MD Electrophysiologist: None  Clinic Note: Chief Complaint  Patient presents with   Follow-up    Annual follow-up.  Doing well.  No further syncope.   ===================================  ASSESSMENT/PLAN   Problem List Items Addressed This Visit       Cardiology Problems   Orthostatic hypotension (Chronic)    Maintain adequate hydration.  Allow for mild permissive hypertension-will hold hydralazine if SBP less than 100-110 mmHg.      Relevant Medications   hydrALAZINE (APRESOLINE) 25 MG tablet   olmesartan (BENICAR) 20 MG tablet   Other Relevant Orders   EKG 12-Lead (Completed)   Labile hypertension - Primary (Chronic)    Blood pressure seems to be pretty stable at this point in time.  She is on very stable regimen.  Has not had to use any additional doses of hydralazine.  For now continue current doses of Benicar 20 mg daily and hydralazine with as needed hydralazine for sustained SBP > 150s, and hold a dose for sustained SBP<100 mmHg.      Relevant Medications   hydrALAZINE (APRESOLINE) 25 MG tablet   olmesartan (BENICAR) 20 MG tablet   Other Relevant Orders   EKG 12-Lead (Completed)   Hyperlipidemia associated with type 2 diabetes mellitus (HCC) (Chronic)    Most recent lipids show pretty excellent lipid control.  She is on 20 mg rosuvastatin tolerating very well.  Most recent A1c was down to 5.9 on Ozempic  Continue healthy diet and exercise.      Relevant Medications   hydrALAZINE (APRESOLINE) 25 MG tablet   olmesartan (BENICAR) 20 MG tablet     Other   Pre-operative clearance    Intention to have excess panectomy by her bariatric surgeon prior to his retiring.  No active symptoms.  Would not require any preop cardiac evaluation.      Syncope (Chronic)    Doing well with no other episodes.  Maintain adequate hydration, hold hydralazine for low blood  pressures. Stand up slowly      S/P gastric bypass (Chronic)    Has lost weight and now has significant residual tissue/skin pannus that is causing problems with intertrigo.  She is considering panectomy before her bariatric surgeon retires.  She will be fine proceeding with surgery without any further evaluation.  No active cardiac symptoms.  Would not need stress test or echo.      Family history of abdominal aortic aneurysm (AAA) repair (Chronic)    Can consider abdominal aortic ultrasound for AAA screening.  Will discuss in follow-up      ===================================  HPI:    [name] is a 67 y.o. female with a PMH notable for labile HTN and also orthostatic hypotension and syncope along with fibromyalgia, depression, IBS, and spinal stenosis who presents today for annual follow-up at the request of [name], *.  [name] was last seen on July 21, 2021 as a follow-up after evaluation for syncopal episode that occurred after being discharged from spinal surgery.  She apparently did not received adequate postsurgical IV hydration, as she felt unsteady on discharge.  Several hours later she was at home and went to the bathroom and syncopized after standing back up.  Thankfully, she was able to restart her Benicar after 2 weeks.  Still somewhat deconditioned.  Still has some orthostatic dizziness symptoms but also lower extremity leg discomfort from.  Back pain, hip  pain shoulder pain.  Somewhat dysthymic. ->  Carotid Dopplers did not show any evidence of subclavian steal. Plan was to continue hydralazine twice daily, but hold for systolic pressure less than 110 and take an additional dose for systolic pressures over 027.  Continuing baseline on losartan 20 mg daily.  Continue to hold HCTZ  Recent Hospitalizations:  04/06/2022-ER visit for acute bronchitis. 04/16/2022 ER visit for right foot pain 05/23/22: ER visit for Viral URI  Reviewed  CV  studies:    The following studies were reviewed today: (if available, images/films reviewed: From Epic Chart or Care Everywhere) N/a:  Interval History:   [name] returns today for annual follow-up doing fairly well.  She said that she was recently started on Ozempic and has lost quite a bit of weight.  She has had a couple episodes of lightheadedness due to hypoglycemia since starting Ozempic but now understands and avoid the symptoms and our assessment in the nearby.  Has had no syncope or near syncope.  Her blood pressures have been very stable and she has not required any additional doses of hydralazine.  She has had no further syncope or near syncopal symptoms.  Pretty much asymptomatic from a cardiac standpoint.  Doing well.  Staying adequately hydrated.  Not really having any edema issues.  Barely using any as needed Lasix.   CV Review of Symptoms (Summary): no chest pain or dyspnea on exertion positive for - lightheaded if CBG low. negative for - edema, irregular heartbeat, orthopnea, palpitations, paroxysmal nocturnal dyspnea, rapid heart rate, shortness of breath, or syncope, near syncope, TAI/amaurosis fugax, claudication  She talked briefly about possibly having a pan ectomy because of the residual extra skin after her weight loss.  She is continuously having issues with intertrigo.  REVIEWED OF SYSTEMS   Review of Systems  Constitutional:  Positive for weight loss. Negative for malaise/fatigue.  Musculoskeletal:  Positive for back pain and joint pain (hip - limits walking -- hard to walk).   I have reviewed and (if needed) personally updated the patient's problem list, medications, allergies, past medical and surgical history, social and family history.   PAST MEDICAL HISTORY   Past Medical History:  Diagnosis Date   Allergic rhinitis    Anxiety    Asthma    hx bronchial asthma at times with upper resp infection   Depression    Fibromyalgia    GERD  (gastroesophageal reflux disease)    Headache(784.0)    migraine and cluster headaches   Hyperlipemia    Hypertension    IBS (irritable bowel syndrome)    Menopause    Neuromuscular disorder ([place]) 2009   hx of fibromyalgia, polyarthralsia (surgery induced)   Osteoarthritis    Rheumatoid arthritis ([place])    Complicated by osteoarthritis as well.   Rheumatoid arthritis ([place])    Stress incontinence    at times   Tibial fracture 10/14/2016   evulsion periostial right   Type 2 diabetes mellitus with complication, without long-term current use of insulin (HCC)    diet controlled no meds -> however was diagnosed with a diabetic foot ulcer.    PAST SURGICAL HISTORY   Past Surgical History:  Procedure Laterality Date   ABDOMINAL HYSTERECTOMY  12/1999   complete   CARPAL TUNNEL RELEASE Right 12/27/2017   Procedure: CARPAL TUNNEL RELEASE;  Surgeon: Roseanne Kaufman, MD;  Location: [facility];  Service: Orthopedics;  Laterality: Right;   CERVICAL CONIZATION W/BX  june 1990   dysplasia of  cervix/used 5Fu cream for 3 months   CERVICAL LAMINECTOMY  2005 & 2009   X 2   NO ROM PROBLEMS   CESAREAN SECTION  1989 & 1993   X 2   CHOLECYSTECTOMY  1986   DILATION AND CURETTAGE OF UTERUS   april-1989   missed abortion   ESOPHAGOGASTRODUODENOSCOPY  06/29/2011   Procedure: ESOPHAGOGASTRODUODENOSCOPY (EGD);  Surgeon: Shann Medal, MD;  Location: [facility];  Service: General;  Laterality: N/A;   FOOT SURGERY  2005   RT HEEL   GASTRIC BANDING PORT REVISION  09/24/2011   Procedure: GASTRIC BANDING PORT REVISION;  Surgeon: Pedro Earls, MD;  Location: [facility];  Service: General;  Laterality: N/A;   GASTRIC ROUX-EN-Y N/A 06/06/2017   Procedure: Conversion from Sleeve to [place];  Surgeon: Johnathan Hausen, MD;  Location: [facility];  Service: General;  Laterality: N/A;   [place]  12/30/09   LAPAROSCOPIC GASTRIC  SLEEVE RESECTION N/A 07/02/2013   Procedure: LAPAROSCOPIC GASTRIC SLEEVE RESECTION upper endoscopy;  Surgeon: Pedro Earls, MD;  Location: [facility];  Service: General;  Laterality: N/A;   LAPAROSCOPIC REPAIR AND REMOVAL OF GASTRIC BAND N/A 07/02/2013   Procedure: LAPAROSCOPIC REMOVAL OF GASTRIC BAND;  Surgeon: Pedro Earls, MD;  Location: [facility];  Service: General;  Laterality: N/A;   LAPAROSCOPIC REVISION OF GASTRIC BAND  07/03/2012   Procedure: LAPAROSCOPIC REVISION OF GASTRIC BAND;  Surgeon: Pedro Earls, MD;  Location: [facility];  Service: General;  Laterality: N/A;  removal of old lap. band port, replaced with AP standard band   LAPAROSCOPY  09/24/2011   Procedure: LAPAROSCOPY DIAGNOSTIC;  Surgeon: Pedro Earls, MD;  Location: [facility];  Service: General;  Laterality: N/A;   LAPAROSCOPY  07/03/2012   Procedure: LAPAROSCOPY DIAGNOSTIC;  Surgeon: Pedro Earls, MD;  Location: [facility];  Service: General;  Laterality: N/A;  Exploratory Laparoscopy    MESH APPLIED TO LAP PORT  07/03/2012   Procedure: MESH APPLIED TO LAP PORT;  Surgeon: Pedro Earls, MD;  Location: [facility];  Service: General;  Laterality: N/A;   [place]   X 2   right knee  1981   ARTHROSCOPY AND ARTHROTOMY   right knee arthroscopy and arthrotomy  12-1979   House   WITH C -SECTION    Immunization History  Administered Date(s) Administered   COVID-19, mRNA, vaccine(Comirnaty)12 years and older 04/22/2022   Fluad Quad(high Dose 65+) 03/30/2022   H1N1 07/23/2008   Influenza Whole 04/24/2008   Influenza,inj,Quad PF,6+ Mos 04/11/2013, 04/04/2019, 04/21/2021   Influenza,inj,quad, With Preservative 03/25/2020   Influenza-Unspecified 04/25/2014, 03/13/2015, 03/12/2016, 03/25/2020   Moderna Sars-Covid-2 Vaccination 07/11/2019, 07/31/2019   PFIZER Comirnaty(Gray Top)Covid-19 Tri-Sucrose Vaccine 07/11/2019, 07/31/2019, 03/08/2020, 10/20/2020   PFIZER(Purple  Top)SARS-COV-2 Vaccination 07/11/2019, 07/31/2019, 03/08/2020   PNEUMOCOCCAL CONJUGATE-20 10/22/2021   Pfizer Covid-19 Vaccine Bivalent Booster 75yr & up 07/09/2021   Pneumococcal Polysaccharide-23 07/04/2013   Td 01/02/2009   Td (Adult), 2 Lf Tetanus Toxid, Preservative Free 01/02/2009   Tdap 10/04/2018   Zoster Recombinat (Shingrix) 03/21/2017, 05/25/2017    MEDICATIONS/ALLERGIES   Current Meds  Medication Sig   acetaminophen (TYLENOL) 650 MG CR tablet Take by mouth.   albuterol (VENTOLIN HFA) 108 (90 Base) MCG/ACT inhaler    Ca Phosphate-Cholecalciferol (CALTRATE GUMMY BITES PO) Take 2 each 2 (two) times daily by mouth.  calcitonin, salmon, (MIACALCIN/FORTICAL) 200 UNIT/ACT nasal spray Instill 1 spray by nasal route every day, alternating nostrils.   Cholecalciferol 125 MCG (5000 UT) TABS take 1 tablet by oral route every day   conjugated estrogens (PREMARIN) vaginal cream INSERT 1 GRAM of cream per vagina THREE TIMES A WEEK BY VAGINAL ROUTE.   Cyanocobalamin 3000 MCG/ML LIQD as directed Sublingual   cyclobenzaprine (FLEXERIL) 10 MG tablet Take 1 tablet (10 mg total) by mouth 3 (three) times daily as needed for muscle spasms.   diclofenac Sodium (VOLTAREN) 1 % GEL Apply 4 g topically 4 (four) times daily.   DULoxetine (CYMBALTA) 30 MG capsule Take 1 capsule (30 mg total) by mouth 2 (two) times daily.   Efinaconazole (JUBLIA) 10 % SOLN APPLY TO AFFECTED TOENAILS ONCE DAILY FOR 48 WEEKS   Emollient (DERMEND BRUISE FORMULA) CREA as directed Externally   estradiol (DOTTI) 0.1 MG/24HR patch Apply 1 patch twice a week by transdermal route.   ferric derisomaltose (MONOFERRIC) 1000 MG/10ML SOLN injection as directed Intravenous   fluticasone (FLOVENT HFA) 110 MCG/ACT inhaler    furosemide (LASIX) 20 MG tablet Take 1 tablet (20 mg total) by mouth every morning.   Galcanezumab-gnlm (EMGALITY) 120 MG/ML SOAJ INJECT 120 MG INTO THE SKIN EVERY 28 (TWENTY-EIGHT) DAYS.   glucose blood test  strip Test blood sugar once daily. Dx Code: E11.9   hydroxychloroquine (PLAQUENIL) 200 MG tablet Take 2 tablets (400 mg total) by mouth daily.   Hyoscyamine Sulfate SL (LEVSIN/SL) 0.125 MG SUBL Place 1 tablet under the tongue every 6 (six) hours as needed.   loratadine (CLARITIN) 10 MG tablet 10 mg by oral route.   methocarbamol (ROBAXIN) 500 MG tablet Take 1 tablet (500 mg total) by mouth 4 (four) times daily as needed for muscle spasms.   Multiple Vitamins-Minerals (CENTRUM ADULTS) TABS take 2 tablets by oral route  every day with food   mupirocin ointment (BACTROBAN) 2 % Apply to toes as needed   nystatin (MYCOSTATIN/NYSTOP) powder APPLY 1 APPLICATION TOPICALLY 3 (THREE) TIMES DAILY.   pantoprazole (PROTONIX) 40 MG tablet Take 1 tablet (40 mg total) by mouth daily.   potassium chloride (KLOR-CON M) 10 MEQ tablet Take 1 tablet (10 mEq total) by mouth daily.   pregabalin (LYRICA) 150 MG capsule Take 1 capsule (150 mg total) by mouth 3 (three) times daily. Max Daily Amount: 450 mg.   rosuvastatin (CRESTOR) 20 MG tablet Take 1 tablet (20 mg total) by mouth daily.   Semaglutide, 2 MG/DOSE, (OZEMPIC, 2 MG/DOSE,) 8 MG/3ML SOPN Inject 2 mg as directed once a week.   temazepam (RESTORIL) 30 MG capsule Take 1 capsule (30 mg total) by mouth at bedtime as needed for sleep.   thiamine (VITAMIN B-1) 100 MG tablet Take 100 mg daily by mouth.   Tocilizumab (ACTEMRA) 80 MG/4ML SOLN injection Inject into the vein.   Ubrogepant (UBRELVY) 100 MG TABS TAKE ONE TABLET BY MOUTH DAILY AS NEEDED FOR MIGRAINE. MAX 2 TABLETS IN 24 HOURS   [DISCONTINUED] hydrALAZINE (APRESOLINE) 25 MG tablet Take 1 tablet (25 mg total) by mouth 2 (two) times daily. Hold if feeling dizzy or SBP<110 mmHg).  Can use additional 25 mg as needed for SBP>150 mmHg   [DISCONTINUED] olmesartan (BENICAR) 20 MG tablet Take 1 tablet (20 mg total) by mouth daily with supper.    Allergies  Allergen Reactions   Gabapentin Swelling    Pt states she  has eye swelling    Isoniazid     Severe  SOB   Morphine Hives and Nausea And Vomiting    Allergic to PCA pump only    Nitrofurantoin Shortness Of Breath, Rash and Other (See Comments)    REACTION: welts   Tamiflu [Oseltamivir Phosphate] Nausea And Vomiting    "I vomited within 30 minutes of taking it and was told I cannot ever take it again."   Hctz [Hydrochlorothiazide] Rash   Macrolides And Ketolides Rash   Promethazine Hcl Other (See Comments)    IF GIVEN  IV-hallucinations     CAN TAKE PO PHENERGAN Other reaction(s): Unknown   Roxicet [Oxycodone-Acetaminophen] Itching   Wound Dressing Adhesive Rash    Adhesive Tape    Fentanyl Hives and Rash    REACTION: welts   Sulfamethoxazole-Trimethoprim Rash    SOCIAL HISTORY/FAMILY HISTORY   Reviewed in Epic:  Pertinent findings:  Social History   Tobacco Use   Smoking status: Never   Smokeless tobacco: Never  Vaping Use   Vaping Use: Never used  Substance Use Topics   Alcohol use: Not Currently   Drug use: Never   Social History   Social History Narrative   Right handed   Two story home   Drinks caffeine occasionally    OBJCTIVE -PE, EKG, labs   Wt Readings from Last 3 Encounters:  07/23/22 201 lb 3.2 oz (91.3 kg)  06/21/22 204 lb 6.4 oz (92.7 kg)  06/11/22 204 lb (92.5 kg)    Physical Exam: BP (!) 114/52   Pulse 78   Ht '5\' 11"'$  (1.803 m)   Wt 201 lb 3.2 oz (91.3 kg)   SpO2 95%   BMI 28.06 kg/m  Physical Exam Vitals reviewed.  Constitutional:      Appearance: Normal appearance. She is normal weight. She is not ill-appearing (healthy appearing -- additional 20  lb loss.  well groomed).  HENT:     Head: Normocephalic and atraumatic.  Neck:     Vascular: No carotid bruit (radiated AoV murmur).  Cardiovascular:     Rate and Rhythm: Normal rate and regular rhythm.     Pulses: Normal pulses.     Heart sounds: Murmur (2/6 SEM '@RUSB'$ ) heard.  Pulmonary:     Effort: Pulmonary effort is normal. No respiratory  distress.     Breath sounds: Normal breath sounds. No wheezing, rhonchi or rales.  Musculoskeletal:        General: No swelling. Normal range of motion.     Cervical back: Normal range of motion and neck supple.  Skin:    General: Skin is warm and dry.     Comments: Mild Varicose Veins in bilateral LE  Neurological:     General: No focal deficit present.     Mental Status: She is alert and oriented to person, place, and time. Mental status is at baseline.     Gait: Gait abnormal.  Psychiatric:        Mood and Affect: Mood normal.        Behavior: Behavior normal.        Thought Content: Thought content normal.        Judgment: Judgment normal.     Adult ECG Report  Rate: 78 ;  Rhythm: normal sinus rhythm and NS IVCD-incRBBB, CRO AMI age indeterminate ;   Narrative Interpretation: stable  Recent Labs:  reviewed  Lab Results  Component Value Date   CHOL 119 04/22/2022   HDL 60.60 04/22/2022   LDLCALC 45 04/22/2022   TRIG 68.0 04/22/2022   CHOLHDL  2 04/22/2022   Lab Results  Component Value Date   CREATININE 0.77 06/21/2022   BUN 10 06/21/2022   NA 142 06/21/2022   K 3.7 06/21/2022   CL 103 06/21/2022   CO2 30 06/21/2022      Latest Ref Rng & Units 06/21/2022    9:10 AM 02/03/2022    8:00 AM 10/22/2021    9:57 AM  CBC  WBC 3.4 - 10.8 x10E3/uL 5.2  7.6  6.2   Hemoglobin 11.1 - 15.9 g/dL 13.8  13.5  13.5   Hematocrit 34.0 - 46.6 % 40.6  41.2  40.3   Platelets 150 - 450 x10E3/uL 140  151  140.0     Lab Results  Component Value Date   HGBA1C 5.9 04/22/2022   Lab Results  Component Value Date   TSH 1.67 10/22/2021    ================================================== I spent a total of 27 minutes with the patient spent in direct patient consultation.  Additional time spent with chart review  / charting (studies, outside notes, etc): 15 min Total Time: 42 min  Current medicines are reviewed at length with the patient today.  (+/- concerns) n/a  Notice: This  dictation was prepared with Dragon dictation along with smart phrase technology. Any transcriptional errors that result from this process are unintentional and may not be corrected upon review.  Studies Ordered:   Orders Placed This Encounter  Procedures   EKG 12-Lead   Meds ordered this encounter  Medications   hydrALAZINE (APRESOLINE) 25 MG tablet    Sig: Take 1 tablet (25 mg total) by mouth 2 (two) times daily. Hold if feeling dizzy or SBP<110 mmHg).  Can use additional 25 mg as needed for SBP>150 mmHg    Dispense:  180 tablet    Refill:  3   olmesartan (BENICAR) 20 MG tablet    Sig: Take 1 tablet (20 mg total) by mouth daily with supper.    Dispense:  90 tablet    Refill:  3    Patient Instructions / Medication Changes & Studies & Tests Ordered   Patient Instructions  Medication Instructions:  No changes   *If you need a refill on your cardiac medications before your next appointment, please call your pharmacy*   Lab Work:  Not needed   Testing/Procedures: Not needed   Follow-Up: At [facility], you and your health needs are our priority.  As part of our continuing mission to provide you with exceptional heart care, we have created designated Provider Care Teams.  These Care Teams include your primary Cardiologist (physician) and Advanced Practice Providers (APPs -  Physician Assistants and Nurse Practitioners) who all work together to provide you with the care you need, when you need it.     Your next appointment:   12 month(s)  The format for your next appointment:   In Person  Provider:   Glenetta Hew, MD    Other Instructions  I f you have surgery in the next six month - -no need for further cardiac evaluation - you are cleared for surgery -  Surgeon will need to to send a  official  notification  for clearance.      Leonie Man, MD, MS Glenetta Hew, M.D., M.S. Interventional Cardiologist  [place]  Pager #  [phone] Phone # [phone] [address] [place], [place] [address]   Thank you for choosing [facility] [place]!!

## 2022-07-23 NOTE — Patient Instructions (Signed)
Medication Instructions:  No changes   *If you need a refill on your cardiac medications before your next appointment, please call your pharmacy*   Lab Work:  Not needed   Testing/Procedures: Not needed   Follow-Up: At [facility], you and your health needs are our priority.  As part of our continuing mission to provide you with exceptional heart care, we have created designated Provider Care Teams.  These Care Teams include your primary Cardiologist (physician) and Advanced Practice Providers (APPs -  Physician Assistants and Nurse Practitioners) who all work together to provide you with the care you need, when you need it.     Your next appointment:   12 month(s)  The format for your next appointment:   In Person  Provider:   Glenetta Hew, MD    Other Instructions  I f you have surgery in the next six month - -no need for further cardiac evaluation - you are cleared for surgery -  Surgeon will need to to send a  official  notification  for clearance.

## 2022-07-28 DIAGNOSIS — M0609 Rheumatoid arthritis without rheumatoid factor, multiple sites: Secondary | ICD-10-CM | POA: Diagnosis not present

## 2022-07-28 LAB — LAB REPORT - SCANNED: EGFR: 66

## 2022-08-01 ENCOUNTER — Encounter: Payer: Self-pay | Admitting: Cardiology

## 2022-08-01 NOTE — Assessment & Plan Note (Signed)
Maintain adequate hydration.  Allow for mild permissive hypertension-will hold hydralazine if SBP less than 100-110 mmHg.

## 2022-08-01 NOTE — Assessment & Plan Note (Signed)
Blood pressure seems to be pretty stable at this point in time.  She is on very stable regimen.  Has not had to use any additional doses of hydralazine.  For now continue current doses of Benicar 20 mg daily and hydralazine with as needed hydralazine for sustained SBP > 150s, and hold a dose for sustained SBP<100 mmHg.

## 2022-08-01 NOTE — Assessment & Plan Note (Signed)
Has lost weight and now has significant residual tissue/skin pannus that is causing problems with intertrigo.  She is considering panectomy before her bariatric surgeon retires.  She will be fine proceeding with surgery without any further evaluation.  No active cardiac symptoms.  Would not need stress test or echo.

## 2022-08-01 NOTE — Assessment & Plan Note (Signed)
Can consider abdominal aortic ultrasound for AAA screening.  Will discuss in follow-up

## 2022-08-01 NOTE — Assessment & Plan Note (Signed)
Doing well with no other episodes.  Maintain adequate hydration, hold hydralazine for low blood pressures. Stand up slowly

## 2022-08-01 NOTE — Assessment & Plan Note (Addendum)
Most recent lipids show pretty excellent lipid control.  She is on 20 mg rosuvastatin tolerating very well.  Most recent A1c was down to 5.9 on Ozempic  Continue healthy diet and exercise.

## 2022-08-01 NOTE — Assessment & Plan Note (Signed)
Intention to have excess panectomy by her bariatric surgeon prior to his retiring.  No active symptoms.  Would not require any preop cardiac evaluation.

## 2022-08-02 ENCOUNTER — Other Ambulatory Visit (HOSPITAL_BASED_OUTPATIENT_CLINIC_OR_DEPARTMENT_OTHER): Payer: Self-pay

## 2022-08-02 ENCOUNTER — Other Ambulatory Visit: Payer: Self-pay

## 2022-08-06 ENCOUNTER — Inpatient Hospital Stay: Payer: Commercial Managed Care - PPO | Attending: Hematology & Oncology

## 2022-08-06 ENCOUNTER — Inpatient Hospital Stay: Payer: Commercial Managed Care - PPO | Admitting: Family

## 2022-08-06 ENCOUNTER — Encounter: Payer: Self-pay | Admitting: Family

## 2022-08-06 VITALS — BP 134/65 | HR 74 | Temp 98.4°F | Resp 18 | Wt 200.1 lb

## 2022-08-06 DIAGNOSIS — D509 Iron deficiency anemia, unspecified: Secondary | ICD-10-CM

## 2022-08-06 DIAGNOSIS — K912 Postsurgical malabsorption, not elsewhere classified: Secondary | ICD-10-CM | POA: Insufficient documentation

## 2022-08-06 DIAGNOSIS — D508 Other iron deficiency anemias: Secondary | ICD-10-CM | POA: Insufficient documentation

## 2022-08-06 DIAGNOSIS — Z9884 Bariatric surgery status: Secondary | ICD-10-CM | POA: Diagnosis not present

## 2022-08-06 LAB — RETICULOCYTES
Immature Retic Fract: 8 % (ref 2.3–15.9)
RBC.: 4.59 MIL/uL (ref 3.87–5.11)
Retic Count, Absolute: 63.3 10*3/uL (ref 19.0–186.0)
Retic Ct Pct: 1.4 % (ref 0.4–3.1)

## 2022-08-06 LAB — CBC WITH DIFFERENTIAL (CANCER CENTER ONLY)
Abs Immature Granulocytes: 0.01 10*3/uL (ref 0.00–0.07)
Basophils Absolute: 0.1 10*3/uL (ref 0.0–0.1)
Basophils Relative: 1 %
Eosinophils Absolute: 0.2 10*3/uL (ref 0.0–0.5)
Eosinophils Relative: 4 %
HCT: 42.7 % (ref 36.0–46.0)
Hemoglobin: 13.6 g/dL (ref 12.0–15.0)
Immature Granulocytes: 0 %
Lymphocytes Relative: 36 %
Lymphs Abs: 1.7 10*3/uL (ref 0.7–4.0)
MCH: 30 pg (ref 26.0–34.0)
MCHC: 31.9 g/dL (ref 30.0–36.0)
MCV: 94.1 fL (ref 80.0–100.0)
Monocytes Absolute: 0.6 10*3/uL (ref 0.1–1.0)
Monocytes Relative: 14 %
Neutro Abs: 2.1 10*3/uL (ref 1.7–7.7)
Neutrophils Relative %: 45 %
Platelet Count: 146 10*3/uL — ABNORMAL LOW (ref 150–400)
RBC: 4.54 MIL/uL (ref 3.87–5.11)
RDW: 12 % (ref 11.5–15.5)
WBC Count: 4.7 10*3/uL (ref 4.0–10.5)
nRBC: 0 % (ref 0.0–0.2)

## 2022-08-06 LAB — IRON AND IRON BINDING CAPACITY (CC-WL,HP ONLY)
Iron: 134 ug/dL (ref 28–170)
Saturation Ratios: 40 % — ABNORMAL HIGH (ref 10.4–31.8)
TIBC: 339 ug/dL (ref 250–450)
UIBC: 205 ug/dL (ref 148–442)

## 2022-08-06 LAB — FERRITIN: Ferritin: 175 ng/mL (ref 11–307)

## 2022-08-06 NOTE — Progress Notes (Signed)
Hematology and Oncology Follow Up Visit  [name] [id number] [birth year] 67 y.o. 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 [name] is here today for follow-up. She is doing fairly well but does note fatigue, chewing ice off and on and also increased joint aches and pains.  She goes for infusions periodically for arthritis and hs had a bump in her LFT's. Her rheumatologist is going to recheck labs next week.  No fever, chills, n/v, cough, rash, dizziness, SOB, chest pain, palpitations, abdominal pain or changes in bowel or bladder habits at this time.  No swelling in her extremities.  No falls or syncope reported.  Appetite and hydration have been good. Weight is stable at 200 lbs.   ECOG Performance Status: 1 - Symptomatic but completely ambulatory  Medications:  Allergies as of 08/06/2022       Reactions   Gabapentin Swelling   Pt states she has eye swelling    Isoniazid    Severe SOB   Morphine Hives, Nausea And Vomiting   Allergic to PCA pump only   Nitrofurantoin Shortness Of Breath, Rash, Other (See Comments)   REACTION: welts   Tamiflu [oseltamivir Phosphate] Nausea And Vomiting   "I vomited within 30 minutes of taking it and was told I cannot ever take it again."   Hctz [hydrochlorothiazide] Rash   Macrolides And Ketolides Rash   Promethazine Hcl Other (See Comments)   IF GIVEN  IV-hallucinations     CAN TAKE PO PHENERGAN Other reaction(s): Unknown   Roxicet [oxycodone-acetaminophen] Itching   Wound Dressing Adhesive Rash   Adhesive Tape    Fentanyl Hives, Rash   REACTION: welts   Sulfamethoxazole-trimethoprim Rash        Medication List        Accurate as of August 06, 2022  8:50 AM. If you have any questions, ask your nurse or doctor.          acetaminophen 650 MG CR tablet Commonly known as:  TYLENOL Take by mouth.   Actemra 80 MG/4ML Soln injection Generic drug: Tocilizumab Inject into the vein.   albuterol 108 (90 Base) MCG/ACT inhaler Commonly known as: VENTOLIN HFA   calcitonin (salmon) 200 UNIT/ACT nasal spray Commonly known as: MIACALCIN/FORTICAL Instill 1 spray by nasal route every day, alternating nostrils.   CALTRATE GUMMY BITES PO Take 2 each 2 (two) times daily by mouth.   Centrum Adults Tabs take 2 tablets by oral route  every day with food   Cholecalciferol 125 MCG (5000 UT) Tabs take 1 tablet by oral route every day   Cyanocobalamin 3000 MCG/ML Liqd as directed Sublingual   cyclobenzaprine 10 MG tablet Commonly known as: FLEXERIL Take 1 tablet (10 mg total) by mouth 3 (three) times daily as needed for muscle spasms.   DerMend Bruise Formula Crea as directed Externally   diclofenac Sodium 1 % Gel Commonly known as: VOLTAREN Apply 4 g topically 4 (four) times daily.   Dotti 0.1 MG/24HR patch Generic drug: estradiol Apply 1 patch twice a week by transdermal route.   DULoxetine 30 MG capsule Commonly known as: CYMBALTA Take 1 capsule (30 mg total) by mouth 2 (two) times daily.   Emgality 120 MG/ML Soaj Generic drug: Galcanezumab-gnlm INJECT 120 MG INTO THE SKIN EVERY 28 (TWENTY-EIGHT) DAYS.   Flovent HFA 110 MCG/ACT  inhaler Generic drug: fluticasone   furosemide 20 MG tablet Commonly known as: LASIX Take 1 tablet (20 mg total) by mouth every morning.   glucose blood test strip Test blood sugar once daily. Dx Code: E11.9   hydrALAZINE 25 MG tablet Commonly known as: APRESOLINE Take 1 tablet (25 mg total) by mouth 2 (two) times daily. Hold if feeling dizzy or SBP<110 mmHg).  Can use additional 25 mg as needed for SBP>150 mmHg   hydroxychloroquine 200 MG tablet Commonly known as: PLAQUENIL Take 2 tablets (400 mg total) by mouth daily.   Jublia 10 % Soln Generic drug: Efinaconazole APPLY TO AFFECTED TOENAILS ONCE DAILY FOR 48  WEEKS   loratadine 10 MG tablet Commonly known as: CLARITIN 10 mg by oral route.   methocarbamol 500 MG tablet Commonly known as: ROBAXIN Take 1 tablet (500 mg total) by mouth 4 (four) times daily as needed for muscle spasms.   Monoferric 1000 MG/10ML Soln injection Generic drug: ferric derisomaltose as directed Intravenous   mupirocin ointment 2 % Commonly known as: BACTROBAN Apply to toes as needed   nystatin powder Commonly known as: MYCOSTATIN/NYSTOP APPLY 1 APPLICATION TOPICALLY 3 (THREE) TIMES DAILY.   olmesartan 20 MG tablet Commonly known as: BENICAR Take 1 tablet (20 mg total) by mouth daily with supper.   Oscimin 0.125 MG Subl Generic drug: Hyoscyamine Sulfate SL Place 1 tablet under the tongue every 6 (six) hours as needed.   Ozempic (2 MG/DOSE) 8 MG/3ML Sopn Generic drug: Semaglutide (2 MG/DOSE) Inject 2 mg as directed once a week.   pantoprazole 40 MG tablet Commonly known as: PROTONIX Take 1 tablet (40 mg total) by mouth daily.   potassium chloride 10 MEQ tablet Commonly known as: KLOR-CON M Take 1 tablet (10 mEq total) by mouth daily.   pregabalin 150 MG capsule Commonly known as: LYRICA Take 1 capsule (150 mg total) by mouth 3 (three) times daily. Max Daily Amount: 450 mg.   Premarin vaginal cream Generic drug: conjugated estrogens INSERT 1 GRAM of cream per vagina THREE TIMES A WEEK BY VAGINAL ROUTE.   rosuvastatin 20 MG tablet Commonly known as: CRESTOR Take 1 tablet (20 mg total) by mouth daily.   temazepam 30 MG capsule Commonly known as: RESTORIL Take 1 capsule (30 mg total) by mouth at bedtime as needed for sleep.   thiamine 100 MG tablet Commonly known as: Vitamin B-1 Take 100 mg daily by mouth.   Ubrelvy 100 MG Tabs Generic drug: Ubrogepant TAKE ONE TABLET BY MOUTH DAILY AS NEEDED FOR MIGRAINE. MAX 2 TABLETS IN 24 HOURS        Allergies:  Allergies  Allergen Reactions   Gabapentin Swelling    Pt states she has eye  swelling    Isoniazid     Severe SOB   Morphine Hives and Nausea And Vomiting    Allergic to PCA pump only    Nitrofurantoin Shortness Of Breath, Rash and Other (See Comments)    REACTION: welts   Tamiflu [Oseltamivir Phosphate] Nausea And Vomiting    "I vomited within 30 minutes of taking it and was told I cannot ever take it again."   Hctz [Hydrochlorothiazide] Rash   Macrolides And Ketolides Rash   Promethazine Hcl Other (See Comments)    IF GIVEN  IV-hallucinations     CAN TAKE PO PHENERGAN Other reaction(s): Unknown   Roxicet [Oxycodone-Acetaminophen] Itching   Wound Dressing Adhesive Rash    Adhesive Tape    Fentanyl Hives and Rash  REACTION: welts   Sulfamethoxazole-Trimethoprim Rash    Past Medical History, Surgical history, Social history, and Family History were reviewed and updated.  Review of Systems: All other 10 point review of systems is negative.   Physical Exam:  weight is 200 lb 1.9 oz (90.8 kg). Her oral temperature is 98.4 F (36.9 C). Her blood pressure is 134/65 and her pulse is 74. Her respiration is 18 and oxygen saturation is 99%.   Wt Readings from Last 3 Encounters:  08/06/22 200 lb 1.9 oz (90.8 kg)  07/23/22 201 lb 3.2 oz (91.3 kg)  06/21/22 204 lb 6.4 oz (92.7 kg)    Ocular: Sclerae unicteric, pupils equal, round and reactive to light Ear-nose-throat: Oropharynx clear, dentition fair Lymphatic: No cervical or supraclavicular adenopathy Lungs no rales or rhonchi, good excursion bilaterally Heart regular rate and rhythm, no murmur appreciated Abd soft, nontender, positive bowel sounds MSK no focal spinal tenderness, no joint edema Neuro: non-focal, well-oriented, appropriate affect Breasts: Deferred   Lab Results  Component Value Date   WBC 4.7 08/06/2022   HGB 13.6 08/06/2022   HCT 42.7 08/06/2022   MCV 94.1 08/06/2022   PLT 146 (L) 08/06/2022   Lab Results  Component Value Date   FERRITIN 120 02/03/2022   IRON 86 02/03/2022    TIBC 329 02/03/2022   UIBC 243 02/03/2022   IRONPCTSAT 26 02/03/2022   Lab Results  Component Value Date   RETICCTPCT 1.4 08/06/2022   RBC 4.59 08/06/2022   No results found for: "KPAFRELGTCHN", "LAMBDASER", "KAPLAMBRATIO" No results found for: "IGGSERUM", "IGA", "IGMSERUM" No results found for: "TOTALPROTELP", "ALBUMINELP", "A1GS", "A2GS", "BETS", "BETA2SER", "GAMS", "MSPIKE", "SPEI"   Chemistry      Component Value Date/Time   NA 142 06/21/2022 0910   NA 136 (A) 09/08/2020 0000   K 3.7 06/21/2022 0910   CL 103 06/21/2022 0910   CO2 30 06/21/2022 0910   BUN 10 06/21/2022 0910   BUN 16 09/08/2020 0000   CREATININE 0.77 06/21/2022 0910   CREATININE 0.86 05/11/2021 1435   GLU 162 09/08/2020 0000      Component Value Date/Time   CALCIUM 9.2 06/21/2022 0910   ALKPHOS 83 06/21/2022 0910   AST 44 (H) 06/21/2022 0910   AST 25 05/11/2021 1435   ALT 48 (H) 06/21/2022 0910   ALT 24 05/11/2021 1435   BILITOT 0.7 06/21/2022 0910   BILITOT 0.6 05/11/2021 1435       Impression and Plan: Ms. Hendryx is a very pleasant 67 yo caucasian female with anemia secondary to malabsorption s/p gastric bypass in 2018.  Iron studies pending.  Follow-up in 1 year.   Lottie Dawson, NP 1/26/20248:50 AM

## 2022-08-16 ENCOUNTER — Encounter (HOSPITAL_BASED_OUTPATIENT_CLINIC_OR_DEPARTMENT_OTHER): Payer: Self-pay

## 2022-08-16 ENCOUNTER — Ambulatory Visit (HOSPITAL_BASED_OUTPATIENT_CLINIC_OR_DEPARTMENT_OTHER)
Admission: RE | Admit: 2022-08-16 | Discharge: 2022-08-16 | Disposition: A | Discharge status: Home / Self Care | Payer: Commercial Managed Care - PPO | Source: Ambulatory Visit | Attending: Obstetrics and Gynecology | Admitting: Obstetrics and Gynecology

## 2022-08-16 DIAGNOSIS — Z1231 Encounter for screening mammogram for malignant neoplasm of breast: Secondary | ICD-10-CM

## 2022-08-17 ENCOUNTER — Other Ambulatory Visit (HOSPITAL_BASED_OUTPATIENT_CLINIC_OR_DEPARTMENT_OTHER): Payer: Self-pay

## 2022-08-18 ENCOUNTER — Other Ambulatory Visit: Payer: Self-pay

## 2022-08-18 DIAGNOSIS — M0609 Rheumatoid arthritis without rheumatoid factor, multiple sites: Secondary | ICD-10-CM | POA: Diagnosis not present

## 2022-08-22 ENCOUNTER — Other Ambulatory Visit: Payer: Self-pay | Admitting: Family Medicine

## 2022-08-22 ENCOUNTER — Encounter: Payer: Self-pay | Admitting: Family

## 2022-08-23 ENCOUNTER — Other Ambulatory Visit (HOSPITAL_BASED_OUTPATIENT_CLINIC_OR_DEPARTMENT_OTHER): Payer: Self-pay

## 2022-08-23 MED ORDER — DICLOFENAC SODIUM 1 % EX GEL
4.0000 g | Freq: Four times a day (QID) | CUTANEOUS | 5 refills | Status: DC
  Filled 2022-08-23: qty 100, 14d supply, fill #0

## 2022-08-24 ENCOUNTER — Encounter: Payer: Self-pay | Admitting: Family Medicine

## 2022-08-25 ENCOUNTER — Other Ambulatory Visit (HOSPITAL_BASED_OUTPATIENT_CLINIC_OR_DEPARTMENT_OTHER): Payer: Self-pay

## 2022-08-25 MED ORDER — DICLOFENAC SODIUM 1 % EX GEL
4.0000 g | Freq: Four times a day (QID) | CUTANEOUS | 6 refills | Status: DC
  Filled 2022-08-25 – 2022-09-02 (×2): qty 400, 56d supply, fill #0
  Filled 2022-09-03: qty 400, 25d supply, fill #0
  Filled 2022-09-03: qty 400, 56d supply, fill #0
  Filled 2022-11-03: qty 400, 56d supply, fill #1
  Filled 2022-12-22 – 2022-12-23 (×2): qty 400, 56d supply, fill #2
  Filled 2023-02-19: qty 400, 56d supply, fill #3
  Filled 2023-04-16: qty 300, 42d supply, fill #4

## 2022-08-26 ENCOUNTER — Other Ambulatory Visit: Payer: Self-pay | Admitting: Family Medicine

## 2022-08-26 ENCOUNTER — Other Ambulatory Visit (HOSPITAL_BASED_OUTPATIENT_CLINIC_OR_DEPARTMENT_OTHER): Payer: Self-pay

## 2022-08-26 DIAGNOSIS — E118 Type 2 diabetes mellitus with unspecified complications: Secondary | ICD-10-CM

## 2022-08-27 ENCOUNTER — Other Ambulatory Visit (HOSPITAL_BASED_OUTPATIENT_CLINIC_OR_DEPARTMENT_OTHER): Payer: Self-pay

## 2022-08-27 MED ORDER — OZEMPIC (2 MG/DOSE) 8 MG/3ML ~~LOC~~ SOPN
2.0000 mg | PEN_INJECTOR | SUBCUTANEOUS | 3 refills | Status: DC
  Filled 2022-08-27: qty 3, 28d supply, fill #0
  Filled 2022-09-21: qty 3, 28d supply, fill #1
  Filled 2022-10-19: qty 3, 28d supply, fill #2
  Filled 2022-11-14: qty 3, 28d supply, fill #3

## 2022-08-30 ENCOUNTER — Encounter: Payer: Self-pay | Admitting: Family Medicine

## 2022-08-31 ENCOUNTER — Other Ambulatory Visit: Payer: Self-pay | Admitting: Family Medicine

## 2022-08-31 DIAGNOSIS — M0579 Rheumatoid arthritis with rheumatoid factor of multiple sites without organ or systems involvement: Secondary | ICD-10-CM

## 2022-08-31 MED ORDER — CYCLOBENZAPRINE HCL 10 MG PO TABS: 10.0000 mg | ORAL_TABLET | Freq: Three times a day (TID) | ORAL | 1 refills | Status: DC | PRN

## 2022-09-01 ENCOUNTER — Other Ambulatory Visit: Payer: Self-pay

## 2022-09-01 ENCOUNTER — Other Ambulatory Visit (HOSPITAL_COMMUNITY): Payer: Self-pay

## 2022-09-01 DIAGNOSIS — M0609 Rheumatoid arthritis without rheumatoid factor, multiple sites: Secondary | ICD-10-CM | POA: Diagnosis not present

## 2022-09-01 MED ORDER — CYCLOBENZAPRINE HCL 10 MG PO TABS
10.0000 mg | ORAL_TABLET | Freq: Every evening | ORAL | 2 refills | Status: DC
  Filled 2022-09-01 – 2022-10-04 (×4): qty 30, 30d supply, fill #0

## 2022-09-02 ENCOUNTER — Encounter: Payer: Self-pay | Admitting: Family

## 2022-09-02 ENCOUNTER — Other Ambulatory Visit (HOSPITAL_BASED_OUTPATIENT_CLINIC_OR_DEPARTMENT_OTHER): Payer: Self-pay

## 2022-09-02 DIAGNOSIS — I10 Essential (primary) hypertension: Secondary | ICD-10-CM | POA: Diagnosis not present

## 2022-09-02 DIAGNOSIS — R011 Cardiac murmur, unspecified: Secondary | ICD-10-CM | POA: Diagnosis not present

## 2022-09-02 DIAGNOSIS — M069 Rheumatoid arthritis, unspecified: Secondary | ICD-10-CM | POA: Diagnosis not present

## 2022-09-02 DIAGNOSIS — M797 Fibromyalgia: Secondary | ICD-10-CM | POA: Diagnosis not present

## 2022-09-02 DIAGNOSIS — E785 Hyperlipidemia, unspecified: Secondary | ICD-10-CM | POA: Diagnosis not present

## 2022-09-02 DIAGNOSIS — N951 Menopausal and female climacteric states: Secondary | ICD-10-CM | POA: Diagnosis not present

## 2022-09-02 DIAGNOSIS — E119 Type 2 diabetes mellitus without complications: Secondary | ICD-10-CM | POA: Diagnosis not present

## 2022-09-02 DIAGNOSIS — Z01419 Encounter for gynecological examination (general) (routine) without abnormal findings: Secondary | ICD-10-CM | POA: Diagnosis not present

## 2022-09-02 DIAGNOSIS — N9419 Other specified dyspareunia: Secondary | ICD-10-CM | POA: Diagnosis not present

## 2022-09-02 DIAGNOSIS — Z733 Stress, not elsewhere classified: Secondary | ICD-10-CM | POA: Diagnosis not present

## 2022-09-02 MED ORDER — PREMARIN 0.625 MG/GM VA CREA
1.0000 g | TOPICAL_CREAM | VAGINAL | 5 refills | Status: DC
  Filled 2022-09-02: qty 30, 70d supply, fill #0
  Filled 2023-01-25 – 2023-02-06 (×2): qty 30, 70d supply, fill #1

## 2022-09-02 MED ORDER — TRIAMCINOLONE ACETONIDE 0.1 % EX OINT
1.0000 | TOPICAL_OINTMENT | Freq: Two times a day (BID) | CUTANEOUS | 2 refills | Status: DC
  Filled 2022-09-02: qty 30, 15d supply, fill #0
  Filled 2022-09-13 – 2022-09-14 (×2): qty 30, 15d supply, fill #1
  Filled 2022-10-04: qty 30, 15d supply, fill #2

## 2022-09-03 ENCOUNTER — Other Ambulatory Visit (HOSPITAL_BASED_OUTPATIENT_CLINIC_OR_DEPARTMENT_OTHER): Payer: Self-pay

## 2022-09-03 ENCOUNTER — Other Ambulatory Visit: Payer: Self-pay

## 2022-09-08 ENCOUNTER — Other Ambulatory Visit (HOSPITAL_BASED_OUTPATIENT_CLINIC_OR_DEPARTMENT_OTHER): Payer: Self-pay

## 2022-09-08 ENCOUNTER — Encounter: Payer: Self-pay | Admitting: Podiatry

## 2022-09-08 ENCOUNTER — Other Ambulatory Visit: Payer: Self-pay

## 2022-09-08 ENCOUNTER — Encounter: Payer: Self-pay | Admitting: Family

## 2022-09-08 ENCOUNTER — Ambulatory Visit: Payer: Commercial Managed Care - PPO | Admitting: Podiatry

## 2022-09-08 VITALS — BP 139/62

## 2022-09-08 DIAGNOSIS — M79674 Pain in right toe(s): Secondary | ICD-10-CM | POA: Diagnosis not present

## 2022-09-08 DIAGNOSIS — E119 Type 2 diabetes mellitus without complications: Secondary | ICD-10-CM | POA: Diagnosis not present

## 2022-09-08 DIAGNOSIS — Z0189 Encounter for other specified special examinations: Secondary | ICD-10-CM

## 2022-09-08 DIAGNOSIS — M4316 Spondylolisthesis, lumbar region: Secondary | ICD-10-CM | POA: Diagnosis not present

## 2022-09-08 DIAGNOSIS — E1142 Type 2 diabetes mellitus with diabetic polyneuropathy: Secondary | ICD-10-CM

## 2022-09-08 DIAGNOSIS — M79675 Pain in left toe(s): Secondary | ICD-10-CM

## 2022-09-08 DIAGNOSIS — M4726 Other spondylosis with radiculopathy, lumbar region: Secondary | ICD-10-CM | POA: Diagnosis not present

## 2022-09-08 DIAGNOSIS — M4326 Fusion of spine, lumbar region: Secondary | ICD-10-CM | POA: Diagnosis not present

## 2022-09-08 DIAGNOSIS — M2011 Hallux valgus (acquired), right foot: Secondary | ICD-10-CM

## 2022-09-08 DIAGNOSIS — M2012 Hallux valgus (acquired), left foot: Secondary | ICD-10-CM | POA: Diagnosis not present

## 2022-09-08 DIAGNOSIS — M205X9 Other deformities of toe(s) (acquired), unspecified foot: Secondary | ICD-10-CM | POA: Diagnosis not present

## 2022-09-08 DIAGNOSIS — B351 Tinea unguium: Secondary | ICD-10-CM

## 2022-09-08 MED ORDER — METHOCARBAMOL 500 MG PO TABS
500.0000 mg | ORAL_TABLET | Freq: Three times a day (TID) | ORAL | 0 refills | Status: DC | PRN
  Filled 2022-09-08: qty 90, 30d supply, fill #0

## 2022-09-08 MED ORDER — CYCLOBENZAPRINE HCL 10 MG PO TABS
10.0000 mg | ORAL_TABLET | Freq: Every evening | ORAL | 2 refills | Status: DC
  Filled 2022-09-08 – 2022-12-27 (×9): qty 30, 30d supply, fill #0
  Filled 2023-01-25: qty 30, 30d supply, fill #1
  Filled 2023-02-25 – 2023-03-17 (×3): qty 30, 30d supply, fill #2

## 2022-09-08 MED ORDER — CALCITONIN (SALMON) 200 UNIT/ACT NA SOLN
1.0000 | Freq: Every day | NASAL | 1 refills | Status: DC
  Filled 2022-09-08: qty 3.7, 30d supply, fill #0
  Filled 2022-10-04: qty 3.7, 30d supply, fill #1

## 2022-09-08 NOTE — Progress Notes (Unsigned)
ANNUAL DIABETIC FOOT EXAM  Subjective: [name] presents today {jgcomplaint:23593}.  Chief Complaint  Patient presents with   Nail Problem    [facility] BS-81 A1C-5.9 PCP-[place] PCP VST-05/2022    Patient confirms h/o diabetes.  Patient relates {Numbers; 0-100:15068} year h/o diabetes.  Patient denies any h/o foot wounds.  Patient has h/o foot ulcer of {jgPodToeLocator:23637}, which healed via help of ***.  Patient has h/o amputation(s): {jgamp:23617}.  Patient endorses symptoms of foot numbness.   Patient endorses symptoms of foot tingling.  Patient endorses symptoms of burning in feet.  Patient endorses symptoms of pins/needles sensation in feet.  Patient denies any numbness, tingling, burning, or pins/needle sensation in feet.  Patient has been diagnosed with neuropathy and it is managed with {JGNEUROPATHYMEDS:27053}.  Risk factors: {jgriskfactors:24044}.  Amy Held, DO is patient's PCP. Last visit was {Time; dates multiple:15870}***.  Past Medical History:  Diagnosis Date   Allergic rhinitis    Anxiety    Asthma    hx bronchial asthma at times with upper resp infection   Depression    Fibromyalgia    GERD (gastroesophageal reflux disease)    Headache(784.0)    migraine and cluster headaches   Hyperlipemia    Hypertension    IBS (irritable bowel syndrome)    Menopause    Neuromuscular disorder ([place]) 2009   hx of fibromyalgia, polyarthralsia (surgery induced)   Osteoarthritis    Rheumatoid arthritis ([place])    Complicated by osteoarthritis as well.   Rheumatoid arthritis ([place])    Stress incontinence    at times   Tibial fracture 10/14/2016   evulsion periostial right   Type 2 diabetes mellitus with complication, without long-term current use of insulin (HCC)    diet controlled no meds -> however was diagnosed with a diabetic foot ulcer.   Patient Active Problem List   Diagnosis Date Noted   Family history of abdominal aortic  aneurysm (AAA) repair 07/23/2022   Synovitis of toe 04/19/2022   Preventative health care 10/22/2021   Colon cancer screening 08/05/2021   Diabetic ulcer of toe of right foot associated with type 2 diabetes mellitus, limited to breakdown of skin ([place]) 06/19/2021   Tinea corporis 06/19/2021   Dysuria 06/19/2021   IDA (iron deficiency anemia) 05/18/2021   Pre-operative clearance 04/21/2021   Controlled type 2 diabetes mellitus with hyperglycemia, without long-term current use of insulin ([place]) 04/21/2021   Spinal stenosis of lumbar region 04/21/2021   Bronchitis due to COVID-19 virus 03/27/2021   Orthostatic hypotension 01/09/2021   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/09/2021   Spondylosis of cervical spine 10/07/2020   Lumbar radiculopathy 10/07/2020   Anemia 09/11/2020   Intractable migraine 08/26/2020   Inflammatory arthritis 07/22/2020   Thrombocytopenia ([place]) 07/22/2020   Chronic nonintractable headache 07/21/2020   Syncope 07/21/2020   Dizziness 07/21/2020   Cellulitis of right hand 07/21/2020   Lumbar spondylolysis 05/13/2020   Primary hypertension 04/02/2020   Body mass index (BMI) 29.0-29.9, adult 04/02/2020   Menopausal symptom 03/11/2020   Stress 03/11/2020   Unspecified dyspareunia (CODE) 03/11/2020   Bilateral sacroiliitis ([place]) 03/04/2020   Senile calcific aortic valve sclerosis 02/08/2020   DOE (dyspnea on exertion) 02/08/2020   Murmur 01/07/2020   Acquired trigger finger of right ring finger 10/25/2019   Pain in right knee 08/24/2019   Rheumatoid arthritis involving multiple sites with positive rheumatoid factor ([place]) 08/02/2019   Primary osteoarthritis of both knees 08/02/2019   PTSD (post-traumatic stress disorder)  02/08/2019   Dog bite of arm, right, initial encounter 10/05/2018   Hyperlipidemia associated with type 2 diabetes mellitus ([place]) 08/24/2018   Symptomatic abdominal panniculus 12/12/2017   Carpal tunnel syndrome of left wrist 11/28/2017    Pain in right hand 11/08/2017   Cervical radiculopathy 10/06/2017   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   Keratosis 12/04/2014   Onychocryptosis 08/21/2014   Onychomycosis 05/21/2014   Fissured skin 01/22/2014   Fissure in skin of foot 12/18/2013   Porokeratosis 12/18/2013   Pain in lower limb 11/21/2013   Ingrown nail 07/20/2013   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 07/02/2013   Obesity (BMI 30-39.9) 06/25/2013   GERD (gastroesophageal reflux disease) 08/20/2011   Lapband APL + HH repair June 2011-Major revision Dec 2013 06/29/2011   ABDOMINAL PAIN, ACUTE 08/01/2010   VITAMIN B12 DEFICIENCY 04/01/2010   BARIATRIC SURGERY STATUS 01/29/2010   ONYCHOMYCOSIS, BILATERAL 06/30/2009   STRESS INCONTINENCE 06/10/2009   ACUTE PHARYNGITIS 04/29/2009   COUGH 04/27/2009   BRONCHITIS, ACUTE 04/16/2009   NAUSEA 04/08/2009   DIARRHEA 04/08/2009   MORBID OBESITY 03/03/2009   SKIN RASH 11/18/2008   UNSPECIFIED VITAMIN D DEFICIENCY 07/15/2008   OTHER SPECIFIED ANEMIAS 07/15/2008   Pain in joint, multiple sites 06/13/2008   Myalgia and myositis, unspecified 06/13/2008   BACK PAIN, LUMBAR 02/14/2008   ACUTE SINUSITIS, UNSPECIFIED 09/01/2007   CELLULITIS 08/12/2007   Cellulitis 08/12/2007   Depression with anxiety 06/09/2007   DEPRESSION 06/09/2007   Controlled type 2 diabetes mellitus with diabetic neuropathy, without long-term current use of insulin ([place]) 01/04/2007   Labile hypertension 01/04/2007   ALLERGIC RHINITIS 01/04/2007   HEADACHE 01/04/2007   Past Surgical History:  Procedure Laterality Date   ABDOMINAL HYSTERECTOMY  12/1999   complete   CARPAL TUNNEL RELEASE Right 12/27/2017   Procedure: CARPAL TUNNEL RELEASE;  Surgeon: Roseanne Kaufman, MD;  Location: [place];  Service: Orthopedics;  Laterality: Right;   CERVICAL CONIZATION W/BX  june 1990   dysplasia of cervix/used 5Fu cream for 3 months   CERVICAL LAMINECTOMY  2005 & 2009   X 2   NO ROM [place]   X 2   CHOLECYSTECTOMY  1986   DILATION AND CURETTAGE OF UTERUS   [phone]   missed abortion   ESOPHAGOGASTRODUODENOSCOPY  06/29/2011   Procedure: ESOPHAGOGASTRODUODENOSCOPY (EGD);  Surgeon: Shann Medal, MD;  Location: [facility];  Service: General;  Laterality: N/A;   FOOT SURGERY  2005   RT HEEL   GASTRIC BANDING PORT REVISION  09/24/2011   Procedure: GASTRIC BANDING PORT REVISION;  Surgeon: Pedro Earls, MD;  Location: [facility];  Service: General;  Laterality: N/A;   GASTRIC ROUX-EN-Y N/A 06/06/2017   Procedure: Conversion from [place]lee[place] to [place];  Surgeon: Johnathan Hausen, MD;  Location: [facility];  Service: General;  Laterality: N/A;   [place]  12/30/09   LAPAROSCOPIC GASTRIC SLEEVE RESECTION N/A 07/02/2013   Procedure: LAPAROSCOPIC GASTRIC SLEEVE RESECTION upper endoscopy;  Surgeon: Pedro Earls, MD;  Location: [facility];  Service: General;  Laterality: N/A;   LAPAROSCOPIC REPAIR AND REMOVAL OF GASTRIC BAND N/A 07/02/2013   Procedure: LAPAROSCOPIC REMOVAL OF GASTRIC BAND;  Surgeon: Pedro Earls, MD;  Location: [facility];  Service: General;  Laterality: N/A;   LAPAROSCOPIC REVISION OF GASTRIC BAND  07/03/2012   Procedure: LAPAROSCOPIC REVISION OF GASTRIC BAND;  Surgeon: Rodman Key  Verdie Drown, MD;  Location: [facility];  Service: General;  Laterality: N/A;  removal of old lap. band port, replaced with AP standard band   LAPAROSCOPY  09/24/2011   Procedure: LAPAROSCOPY DIAGNOSTIC;  Surgeon: Pedro Earls, MD;  Location: [facility];  Service: General;  Laterality: N/A;   LAPAROSCOPY  07/03/2012   Procedure: LAPAROSCOPY DIAGNOSTIC;  Surgeon: Pedro Earls, MD;  Location: [facility];  Service: General;  Laterality: N/A;  Exploratory Laparoscopy    MESH APPLIED TO LAP PORT  07/03/2012   Procedure: MESH APPLIED TO LAP PORT;  Surgeon: Pedro Earls, MD;  Location: [facility];  Service:  General;  Laterality: N/A;   [place]   X 2   right knee  1981   ARTHROSCOPY AND ARTHROTOMY   right knee arthroscopy and arthrotomy  12-1979   [place]   WITH C -SECTION   Current Outpatient Medications on File Prior to Visit  Medication Sig Dispense Refill   acetaminophen (TYLENOL) 650 MG CR tablet Take by mouth.     albuterol (VENTOLIN HFA) 108 (90 Base) MCG/ACT inhaler      Ca Phosphate-Cholecalciferol (CALTRATE GUMMY BITES PO) Take 2 each 2 (two) times daily by mouth.      calcitonin, salmon, (MIACALCIN/FORTICAL) 200 UNIT/ACT nasal spray Instill 1 spray by nasal route every day, alternating nostrils. 3.7 mL 1   Cholecalciferol 125 MCG (5000 UT) TABS take 1 tablet by oral route every day     conjugated estrogens (PREMARIN) vaginal cream INSERT 1 GRAM of cream per vagina THREE TIMES A WEEK BY VAGINAL ROUTE. 30 g 5   conjugated estrogens (PREMARIN) vaginal cream Place 0.5 Applicatorfuls vaginally 3 (three) times a week. 30 g 5   Cyanocobalamin 3000 MCG/ML LIQD as directed Sublingual     cyclobenzaprine (FLEXERIL) 10 MG tablet Take 1 tablet (10 mg total) by mouth 3 (three) times daily as needed for muscle spasms. 90 tablet 1   cyclobenzaprine (FLEXERIL) 10 MG tablet Take 1 tablet (10 mg total) by mouth before bedtime for muscle spasms . 30 tablet 2   diclofenac Sodium (VOLTAREN) 1 % GEL Apply 4 g topically 4 (four) times daily. 400 g 6   DULoxetine (CYMBALTA) 30 MG capsule Take 1 capsule (30 mg total) by mouth 2 (two) times daily. 180 capsule 1   Efinaconazole (JUBLIA) 10 % SOLN APPLY TO AFFECTED TOENAILS ONCE DAILY FOR 48 WEEKS 8 mL 11   Emollient (DERMEND BRUISE FORMULA) CREA as directed Externally     estradiol (DOTTI) 0.1 MG/24HR patch Apply 1 patch twice a week by transdermal route. 24 patch 4   ferric derisomaltose (MONOFERRIC) 1000 MG/10ML SOLN injection as directed Intravenous     fluticasone (FLOVENT HFA) 110 MCG/ACT  inhaler      furosemide (LASIX) 20 MG tablet Take 1 tablet (20 mg total) by mouth every morning. 90 tablet 0   Galcanezumab-gnlm (EMGALITY) 120 MG/ML SOAJ INJECT 120 MG INTO THE SKIN EVERY 28 (TWENTY-EIGHT) DAYS. 1 mL 11   glucose blood test strip Test blood sugar once daily. Dx Code: E11.9 100 each 12   hydrALAZINE (APRESOLINE) 25 MG tablet Take 1 tablet (25 mg total) by mouth 2 (two) times daily. Hold if feeling dizzy or SBP<110 mmHg).  Can use additional 25 mg as needed for SBP>150 mmHg 180 tablet 3   hydroxychloroquine (PLAQUENIL) 200 MG tablet Take 2 tablets (400 mg total) by mouth daily.  180 tablet 1   Hyoscyamine Sulfate SL (LEVSIN/SL) 0.125 MG SUBL Place 1 tablet under the tongue every 6 (six) hours as needed. 30 tablet 0   loratadine (CLARITIN) 10 MG tablet 10 mg by oral route.     methocarbamol (ROBAXIN) 500 MG tablet Take 1 tablet (500 mg total) by mouth 4 (four) times daily as needed for muscle spasms. 120 tablet 2   Multiple Vitamins-Minerals (CENTRUM ADULTS) TABS take 2 tablets by oral route  every day with food     mupirocin ointment (BACTROBAN) 2 % Apply to toes as needed 22 g 1   nystatin (MYCOSTATIN/NYSTOP) powder APPLY 1 APPLICATION TOPICALLY 3 (THREE) TIMES DAILY. 15 g 5   olmesartan (BENICAR) 20 MG tablet Take 1 tablet (20 mg total) by mouth daily with supper. 90 tablet 3   pantoprazole (PROTONIX) 40 MG tablet Take 1 tablet (40 mg total) by mouth daily. 90 tablet 3   potassium chloride (KLOR-CON M) 10 MEQ tablet Take 1 tablet (10 mEq total) by mouth daily. 90 tablet 0   pregabalin (LYRICA) 150 MG capsule Take 1 capsule (150 mg total) by mouth 3 (three) times daily. Max Daily Amount: 450 mg. 90 capsule 2   rosuvastatin (CRESTOR) 20 MG tablet Take 1 tablet (20 mg total) by mouth daily. 90 tablet 1   Semaglutide, 2 MG/DOSE, (OZEMPIC, 2 MG/DOSE,) 8 MG/3ML SOPN Inject 2 mg as directed once a week. 3 mL 3   temazepam (RESTORIL) 30 MG capsule Take 1 capsule (30 mg total) by mouth at  bedtime as needed for sleep. 90 capsule 1   thiamine (VITAMIN B-1) 100 MG tablet Take 100 mg daily by mouth.     Tocilizumab (ACTEMRA) 80 MG/4ML SOLN injection Inject into the vein. 26.88 mL    triamcinolone ointment (KENALOG) 0.1 % Apply 1 Application (a thin layer) topically in the morning and at bedtime. 30 g 2   Ubrogepant (UBRELVY) 100 MG TABS TAKE ONE TABLET BY MOUTH DAILY AS NEEDED FOR MIGRAINE. MAX 2 TABLETS IN 24 HOURS 16 tablet 5   No current facility-administered medications on file prior to visit.    Allergies  Allergen Reactions   Gabapentin Swelling    Pt states she has eye swelling    Isoniazid     Severe SOB   Morphine Hives and Nausea And Vomiting    Allergic to PCA pump only    Nitrofurantoin Shortness Of Breath, Rash and Other (See Comments)    REACTION: welts   Tamiflu [Oseltamivir Phosphate] Nausea And Vomiting    "I vomited within 30 minutes of taking it and was told I cannot ever take it again."   Hctz [Hydrochlorothiazide] Rash   Macrolides And Ketolides Rash   Promethazine Hcl Other (See Comments)    IF GIVEN  IV-hallucinations     CAN TAKE PO PHENERGAN Other reaction(s): Unknown   Roxicet [Oxycodone-Acetaminophen] Itching   Wound Dressing Adhesive Rash    Adhesive Tape    Fentanyl Hives and Rash    REACTION: welts   Sulfamethoxazole-Trimethoprim Rash   Social History   Occupational History   Occupation: Programmer, multimedia: [employer]  Tobacco Use   Smoking status: Never   Smokeless tobacco: Never  Vaping Use   Vaping Use: Never used  Substance and Sexual Activity   Alcohol use: Not Currently   Drug use: Never   Sexual activity: Yes    Birth control/protection: None   Family History  Problem Relation Age  of Onset   Diabetes Mother    Stroke Mother    Breast cancer Mother    Atrial fibrillation Mother    Hypertension Mother    Cancer Mother 41       breast   Diabetes Father    Stroke Father 20       2nd 24 month apart    Hypertension Father    Heart attack Father 6       stents   Dementia Father    AAA (abdominal aortic aneurysm) Father 78       repair   CAD Father 1   Thyroid disease Other    Heart disease Other    COPD Other    Immunization History  Administered Date(s) Administered   COVID-19, mRNA, vaccine(Comirnaty)12 years and older 04/22/2022   Fluad Quad(high Dose 65+) 03/30/2022   H1N1 07/23/2008   Influenza Whole 04/24/2008   Influenza,inj,Quad PF,6+ Mos 04/11/2013, 04/04/2019, 04/21/2021   Influenza,inj,quad, With Preservative 03/25/2020   Influenza-Unspecified 04/25/2014, 03/13/2015, 03/12/2016, 03/25/2020   Moderna Sars-Covid-2 Vaccination 07/11/2019, 07/31/2019   PFIZER Comirnaty(Gray Top)Covid-19 Tri-Sucrose Vaccine 07/11/2019, 07/31/2019, 03/08/2020, 10/20/2020   PFIZER(Purple Top)SARS-COV-2 Vaccination 07/11/2019, 07/31/2019, 03/08/2020   PNEUMOCOCCAL CONJUGATE-20 10/22/2021   Pfizer Covid-19 Vaccine Bivalent Booster 63yr & up 07/09/2021   Pneumococcal Polysaccharide-23 07/04/2013   Td 01/02/2009   Td (Adult), 2 Lf Tetanus Toxid, Preservative Free 01/02/2009   Tdap 10/04/2018   Zoster Recombinat (Shingrix) 03/21/2017, 05/25/2017     Review of Systems: Negative except as noted in the HPI.   Objective: Vitals:   09/08/22 1041  BP: 139/62    BKARENSA ANTEEis a pleasant 67y.o. female in NAD. AAO X 3.  Vascular Examination: {jgvascular:23595}  Dermatological Examination: {jgderm:23598}  Neurological Examination: {jgneuro:23601::"Protective sensation intact 5/5 intact bilaterally with 10g monofilament b/l.","Vibratory sensation intact b/l.","Proprioception intact bilaterally."}  Musculoskeletal Examination: {jgmsk:23600}  Footwear Assessment: Does the patient wear appropriate shoes? {Yes,No}. Does the patient need inserts/orthotics? {Yes,No}.  Lab Results  Component Value Date   HGBA1C 5.9 04/22/2022   No results found. ADA Risk Categorization: Low  Risk :  Patient has all of the following: Intact protective sensation No prior foot ulcer  No severe deformity Pedal pulses present  High Risk  Patient has one or more of the following: Loss of protective sensation Absent pedal pulses Severe Foot deformity History of foot ulcer  Assessment: No diagnosis found.   Plan: No orders of the defined types were placed in this encounter.   No orders of the defined types were placed in this encounter.   None  {jgplan:23602::"-Patient/POA to call should there be question/concern in the interim."} No follow-ups on file.  JMarzetta Board DPM

## 2022-09-09 ENCOUNTER — Other Ambulatory Visit (HOSPITAL_BASED_OUTPATIENT_CLINIC_OR_DEPARTMENT_OTHER): Payer: Self-pay

## 2022-09-09 MED ORDER — MUPIROCIN 2 % EX OINT
TOPICAL_OINTMENT | CUTANEOUS | 1 refills | Status: DC
  Filled 2022-09-09: qty 22, 14d supply, fill #0
  Filled 2022-09-21: qty 22, 14d supply, fill #1

## 2022-09-13 ENCOUNTER — Other Ambulatory Visit (HOSPITAL_BASED_OUTPATIENT_CLINIC_OR_DEPARTMENT_OTHER): Payer: Self-pay

## 2022-09-13 DIAGNOSIS — M0609 Rheumatoid arthritis without rheumatoid factor, multiple sites: Secondary | ICD-10-CM | POA: Diagnosis not present

## 2022-09-13 MED ORDER — ESTRADIOL 0.1 MG/24HR TD PTTW
MEDICATED_PATCH | TRANSDERMAL | 4 refills | Status: DC
  Filled 2022-09-13: qty 24, 84d supply, fill #0
  Filled 2022-12-07: qty 24, 84d supply, fill #1
  Filled 2023-03-03: qty 24, 84d supply, fill #2
  Filled 2023-07-05: qty 24, 84d supply, fill #3

## 2022-09-14 ENCOUNTER — Other Ambulatory Visit (HOSPITAL_BASED_OUTPATIENT_CLINIC_OR_DEPARTMENT_OTHER): Payer: Self-pay

## 2022-09-16 ENCOUNTER — Other Ambulatory Visit (HOSPITAL_BASED_OUTPATIENT_CLINIC_OR_DEPARTMENT_OTHER): Payer: Self-pay

## 2022-09-16 MED ORDER — PREGABALIN 150 MG PO CAPS
150.0000 mg | ORAL_CAPSULE | Freq: Three times a day (TID) | ORAL | 2 refills | Status: DC
  Filled 2022-09-16: qty 90, 30d supply, fill #0
  Filled 2022-10-11: qty 90, 30d supply, fill #1
  Filled 2022-11-14: qty 90, 30d supply, fill #2

## 2022-09-21 ENCOUNTER — Other Ambulatory Visit: Payer: Self-pay | Admitting: Family Medicine

## 2022-09-21 ENCOUNTER — Other Ambulatory Visit (HOSPITAL_BASED_OUTPATIENT_CLINIC_OR_DEPARTMENT_OTHER): Payer: Self-pay

## 2022-09-21 DIAGNOSIS — I10 Essential (primary) hypertension: Secondary | ICD-10-CM

## 2022-09-22 ENCOUNTER — Other Ambulatory Visit (HOSPITAL_BASED_OUTPATIENT_CLINIC_OR_DEPARTMENT_OTHER): Payer: Self-pay

## 2022-09-22 ENCOUNTER — Other Ambulatory Visit: Payer: Self-pay

## 2022-09-22 MED ORDER — FUROSEMIDE 20 MG PO TABS
20.0000 mg | ORAL_TABLET | Freq: Every morning | ORAL | 0 refills | Status: DC
  Filled 2022-09-22: qty 90, 90d supply, fill #0

## 2022-09-23 ENCOUNTER — Encounter: Payer: Self-pay | Admitting: Neurology

## 2022-09-24 ENCOUNTER — Other Ambulatory Visit (HOSPITAL_BASED_OUTPATIENT_CLINIC_OR_DEPARTMENT_OTHER): Payer: Self-pay

## 2022-09-24 ENCOUNTER — Other Ambulatory Visit: Payer: Self-pay

## 2022-09-24 ENCOUNTER — Encounter: Payer: Self-pay | Admitting: Family

## 2022-09-24 MED ORDER — EMGALITY 120 MG/ML ~~LOC~~ SOAJ
SUBCUTANEOUS | 11 refills | Status: DC
  Filled 2022-09-24 (×2): qty 1, 28d supply, fill #0
  Filled 2022-10-19: qty 1, 28d supply, fill #1
  Filled 2022-11-18: qty 1, 28d supply, fill #2
  Filled 2022-12-14: qty 1, 28d supply, fill #3
  Filled 2023-01-09: qty 1, 28d supply, fill #4
  Filled 2023-02-06: qty 1, 28d supply, fill #5
  Filled 2023-03-03: qty 1, 28d supply, fill #6
  Filled 2023-04-06: qty 1, 28d supply, fill #7
  Filled 2023-04-30 – 2023-06-15 (×2): qty 1, 28d supply, fill #8
  Filled 2023-07-12 – 2023-08-02 (×2): qty 1, 28d supply, fill #9
  Filled 2023-09-01: qty 1, 28d supply, fill #10

## 2022-09-24 NOTE — Telephone Encounter (Signed)
Per Patient, I would appreciate it if you would send refills for Emgality to the [facility] on [address]. Thank you! [name].  Patient last seen 12/23, refills sent.

## 2022-09-27 ENCOUNTER — Other Ambulatory Visit (HOSPITAL_BASED_OUTPATIENT_CLINIC_OR_DEPARTMENT_OTHER): Payer: Self-pay

## 2022-09-27 DIAGNOSIS — R748 Abnormal levels of other serum enzymes: Secondary | ICD-10-CM | POA: Diagnosis not present

## 2022-09-27 DIAGNOSIS — M79643 Pain in unspecified hand: Secondary | ICD-10-CM | POA: Diagnosis not present

## 2022-09-27 DIAGNOSIS — M549 Dorsalgia, unspecified: Secondary | ICD-10-CM | POA: Diagnosis not present

## 2022-09-27 DIAGNOSIS — M8580 Other specified disorders of bone density and structure, unspecified site: Secondary | ICD-10-CM | POA: Diagnosis not present

## 2022-09-27 DIAGNOSIS — M0609 Rheumatoid arthritis without rheumatoid factor, multiple sites: Secondary | ICD-10-CM | POA: Diagnosis not present

## 2022-09-27 DIAGNOSIS — Z79899 Other long term (current) drug therapy: Secondary | ICD-10-CM | POA: Diagnosis not present

## 2022-09-27 DIAGNOSIS — M1990 Unspecified osteoarthritis, unspecified site: Secondary | ICD-10-CM | POA: Diagnosis not present

## 2022-09-27 DIAGNOSIS — G8929 Other chronic pain: Secondary | ICD-10-CM | POA: Diagnosis not present

## 2022-09-27 DIAGNOSIS — M797 Fibromyalgia: Secondary | ICD-10-CM | POA: Diagnosis not present

## 2022-09-27 DIAGNOSIS — M2550 Pain in unspecified joint: Secondary | ICD-10-CM | POA: Diagnosis not present

## 2022-09-27 MED ORDER — HYDROXYCHLOROQUINE SULFATE 200 MG PO TABS
400.0000 mg | ORAL_TABLET | Freq: Every day | ORAL | 0 refills | Status: DC
  Filled 2022-09-27: qty 180, 90d supply, fill #0

## 2022-09-30 ENCOUNTER — Other Ambulatory Visit (HOSPITAL_BASED_OUTPATIENT_CLINIC_OR_DEPARTMENT_OTHER): Payer: Self-pay

## 2022-10-04 ENCOUNTER — Other Ambulatory Visit (HOSPITAL_BASED_OUTPATIENT_CLINIC_OR_DEPARTMENT_OTHER): Payer: Self-pay

## 2022-10-08 ENCOUNTER — Other Ambulatory Visit (HOSPITAL_BASED_OUTPATIENT_CLINIC_OR_DEPARTMENT_OTHER): Payer: Self-pay

## 2022-10-12 ENCOUNTER — Other Ambulatory Visit: Payer: Self-pay

## 2022-10-12 DIAGNOSIS — M0609 Rheumatoid arthritis without rheumatoid factor, multiple sites: Secondary | ICD-10-CM | POA: Diagnosis not present

## 2022-10-12 LAB — LAB REPORT - SCANNED: EGFR: 67

## 2022-10-13 ENCOUNTER — Other Ambulatory Visit (HOSPITAL_BASED_OUTPATIENT_CLINIC_OR_DEPARTMENT_OTHER): Payer: Self-pay

## 2022-10-13 ENCOUNTER — Other Ambulatory Visit: Payer: Self-pay

## 2022-10-19 ENCOUNTER — Encounter: Payer: Self-pay | Admitting: Family

## 2022-10-22 ENCOUNTER — Other Ambulatory Visit (HOSPITAL_BASED_OUTPATIENT_CLINIC_OR_DEPARTMENT_OTHER): Payer: Self-pay

## 2022-10-22 ENCOUNTER — Encounter: Payer: Self-pay | Admitting: Family Medicine

## 2022-10-22 ENCOUNTER — Ambulatory Visit: Payer: Commercial Managed Care - PPO | Admitting: Family Medicine

## 2022-10-22 VITALS — BP 128/68 | HR 71 | Temp 97.8°F | Resp 18 | Ht 71.0 in | Wt 201.6 lb

## 2022-10-22 DIAGNOSIS — E785 Hyperlipidemia, unspecified: Secondary | ICD-10-CM

## 2022-10-22 DIAGNOSIS — E118 Type 2 diabetes mellitus with unspecified complications: Secondary | ICD-10-CM | POA: Diagnosis not present

## 2022-10-22 DIAGNOSIS — E559 Vitamin D deficiency, unspecified: Secondary | ICD-10-CM | POA: Diagnosis not present

## 2022-10-22 DIAGNOSIS — M0579 Rheumatoid arthritis with rheumatoid factor of multiple sites without organ or systems involvement: Secondary | ICD-10-CM | POA: Diagnosis not present

## 2022-10-22 DIAGNOSIS — I10 Essential (primary) hypertension: Secondary | ICD-10-CM | POA: Diagnosis not present

## 2022-10-22 DIAGNOSIS — E538 Deficiency of other specified B group vitamins: Secondary | ICD-10-CM | POA: Diagnosis not present

## 2022-10-22 DIAGNOSIS — G4709 Other insomnia: Secondary | ICD-10-CM

## 2022-10-22 LAB — CBC WITH DIFFERENTIAL/PLATELET
Basophils Absolute: 0.1 10*3/uL (ref 0.0–0.1)
Basophils Relative: 1 % (ref 0.0–3.0)
Eosinophils Absolute: 0.1 10*3/uL (ref 0.0–0.7)
Eosinophils Relative: 1.9 % (ref 0.0–5.0)
HCT: 43.1 % (ref 36.0–46.0)
Hemoglobin: 14.5 g/dL (ref 12.0–15.0)
Lymphocytes Relative: 26.7 % (ref 12.0–46.0)
Lymphs Abs: 1.4 10*3/uL (ref 0.7–4.0)
MCHC: 33.7 g/dL (ref 30.0–36.0)
MCV: 90.2 fl (ref 78.0–100.0)
Monocytes Absolute: 0.6 10*3/uL (ref 0.1–1.0)
Monocytes Relative: 11.1 % (ref 3.0–12.0)
Neutro Abs: 3.1 10*3/uL (ref 1.4–7.7)
Neutrophils Relative %: 59.3 % (ref 43.0–77.0)
Platelets: 174 10*3/uL (ref 150.0–400.0)
RBC: 4.77 Mil/uL (ref 3.87–5.11)
RDW: 13.2 % (ref 11.5–15.5)
WBC: 5.3 10*3/uL (ref 4.0–10.5)

## 2022-10-22 LAB — COMPREHENSIVE METABOLIC PANEL
ALT: 60 U/L — ABNORMAL HIGH (ref 0–35)
AST: 43 U/L — ABNORMAL HIGH (ref 0–37)
Albumin: 4.5 g/dL (ref 3.5–5.2)
Alkaline Phosphatase: 85 U/L (ref 39–117)
BUN: 19 mg/dL (ref 6–23)
CO2: 33 mEq/L — ABNORMAL HIGH (ref 19–32)
Calcium: 9.8 mg/dL (ref 8.4–10.5)
Chloride: 98 mEq/L (ref 96–112)
Creatinine, Ser: 0.94 mg/dL (ref 0.40–1.20)
GFR: 63.55 mL/min (ref >60.00)
Glucose, Bld: 81 mg/dL (ref 70–99)
Potassium: 4.7 mEq/L (ref 3.5–5.1)
Sodium: 139 mEq/L (ref 135–145)
Total Bilirubin: 0.7 mg/dL (ref 0.2–1.2)
Total Protein: 7 g/dL (ref 6.0–8.3)

## 2022-10-22 LAB — HEMOGLOBIN A1C: Hgb A1c MFr Bld: 5.5 % (ref 4.6–6.5)

## 2022-10-22 LAB — LIPID PANEL
Cholesterol: 127 mg/dL (ref 0–200)
HDL: 69.1 mg/dL (ref >39.00)
LDL Cholesterol: 44 mg/dL (ref 0–99)
NonHDL: 57.47
Total CHOL/HDL Ratio: 2
Triglycerides: 66 mg/dL (ref 0.0–149.0)
VLDL: 13.2 mg/dL (ref 0.0–40.0)

## 2022-10-22 LAB — VITAMIN B12: Vitamin B-12: 493 pg/mL (ref 211–911)

## 2022-10-22 LAB — VITAMIN D 25 HYDROXY (VIT D DEFICIENCY, FRACTURES): VITD: 39.63 ng/mL (ref 30.00–100.00)

## 2022-10-22 LAB — TSH: TSH: 2.02 u[IU]/mL (ref 0.35–5.50)

## 2022-10-22 MED ORDER — ROSUVASTATIN CALCIUM 20 MG PO TABS
20.0000 mg | ORAL_TABLET | Freq: Every day | ORAL | 1 refills | Status: DC
  Filled 2022-10-22 – 2022-11-03 (×2): qty 90, 90d supply, fill #0
  Filled 2023-02-06: qty 90, 90d supply, fill #1

## 2022-10-22 MED ORDER — TEMAZEPAM 30 MG PO CAPS
30.0000 mg | ORAL_CAPSULE | Freq: Every evening | ORAL | 1 refills | Status: DC | PRN
  Filled 2022-10-22 – 2022-12-23 (×4): qty 90, 90d supply, fill #0

## 2022-10-22 MED ORDER — CYCLOBENZAPRINE HCL 10 MG PO TABS
10.0000 mg | ORAL_TABLET | Freq: Three times a day (TID) | ORAL | 1 refills | Status: DC | PRN
Start: 2022-10-22 — End: 2023-03-15
  Filled 2022-10-22 – 2022-10-28 (×2): qty 90, 30d supply, fill #0

## 2022-10-22 NOTE — Progress Notes (Signed)
Established Patient Office Visit  Subjective   Patient ID: [name], female    DOB: [birth year]  Age: 67 y.o. MRN: [id number]  Chief Complaint  Patient presents with   Hypertension   Hyperlipidemia   Arthritis   Follow-up    HPI Pt is here f/u dm, chol and htn.    HYPERTENSION  Blood pressure range-not checking   Chest pain- no      Dyspnea- no Lightheadedness- no   Edema- no Other side effects - no   Medication compliance: good Low salt diet- yes  DIABETES  Blood Sugar ranges-70-124  Polyuria- no New Visual problems- no Hypoglycemic symptoms- no Other side effects-no Medication compliance - good Last eye exam- 04/2022 Foot exam- podiatry  HYPERLIPIDEMIA  Medication compliance- no RUQ pain- no  Muscle aches- no Other side effects-no    Patient Active Problem List   Diagnosis Date Noted   Family history of abdominal aortic aneurysm (AAA) repair 07/23/2022   Synovitis of toe 04/19/2022   Preventative health care 10/22/2021   Colon cancer screening 08/05/2021   Diabetic ulcer of toe of right foot associated with type 2 diabetes mellitus, limited to breakdown of skin 06/19/2021   Tinea corporis 06/19/2021   Dysuria 06/19/2021   IDA (iron deficiency anemia) 05/18/2021   Pre-operative clearance 04/21/2021   Controlled type 2 diabetes mellitus with hyperglycemia, without long-term current use of insulin 04/21/2021   Spinal stenosis of lumbar region 04/21/2021   Bronchitis due to COVID-19 virus 03/27/2021   Orthostatic hypotension 01/09/2021   Stenosis of left subclavian artery 01/09/2021   Spondylosis of cervical spine 10/07/2020   Lumbar radiculopathy 10/07/2020   Anemia 09/11/2020   Intractable migraine 08/26/2020   Inflammatory arthritis 07/22/2020   Thrombocytopenia 07/22/2020   Chronic nonintractable headache 07/21/2020   Syncope 07/21/2020   Dizziness 07/21/2020   Cellulitis of right hand 07/21/2020   Lumbar spondylolysis 05/13/2020    Primary hypertension 04/02/2020   Body mass index (BMI) 29.0-29.9, adult 04/02/2020   Menopausal symptom 03/11/2020   Stress 03/11/2020   Unspecified dyspareunia (CODE) 03/11/2020   Bilateral sacroiliitis 03/04/2020   Senile calcific aortic valve sclerosis 02/08/2020   DOE (dyspnea on exertion) 02/08/2020   Murmur 01/07/2020   Acquired trigger finger of right ring finger 10/25/2019   Pain in right knee 08/24/2019   Rheumatoid arthritis involving multiple sites with positive rheumatoid factor 08/02/2019   Primary osteoarthritis of both knees 08/02/2019   PTSD (post-traumatic stress disorder) 02/08/2019   Dog bite of arm, right, initial encounter 10/05/2018   Hyperlipidemia associated with type 2 diabetes mellitus 08/24/2018   Symptomatic abdominal panniculus 12/12/2017   Carpal tunnel syndrome of left wrist 11/28/2017   Pain in right hand 11/08/2017   Cervical radiculopathy 10/06/2017   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   Keratosis 12/04/2014   Onychocryptosis 08/21/2014   Onychomycosis 05/21/2014   Fissured skin 01/22/2014   Fissure in skin of foot 12/18/2013   Porokeratosis 12/18/2013   Pain in lower limb 11/21/2013   Ingrown nail 07/20/2013   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 07/02/2013   Obesity (BMI 30-39.9) 06/25/2013   GERD (gastroesophageal reflux disease) 08/20/2011   Lapband APL + HH repair June 2011-Major revision Dec 2013 06/29/2011   ABDOMINAL PAIN, ACUTE 08/01/2010   VITAMIN B12 DEFICIENCY 04/01/2010   BARIATRIC SURGERY STATUS 01/29/2010   ONYCHOMYCOSIS, BILATERAL 06/30/2009   STRESS INCONTINENCE 06/10/2009   ACUTE PHARYNGITIS 04/29/2009   COUGH 04/27/2009   BRONCHITIS, ACUTE 04/16/2009  NAUSEA 04/08/2009   DIARRHEA 04/08/2009   MORBID OBESITY 03/03/2009   SKIN RASH 11/18/2008   UNSPECIFIED VITAMIN D DEFICIENCY 07/15/2008   OTHER SPECIFIED ANEMIAS 07/15/2008   Pain in joint, multiple sites 06/13/2008   Myalgia and myositis, unspecified  06/13/2008   BACK PAIN, LUMBAR 02/14/2008   ACUTE SINUSITIS, UNSPECIFIED 09/01/2007   CELLULITIS 08/12/2007   Cellulitis 08/12/2007   Depression with anxiety 06/09/2007   DEPRESSION 06/09/2007   Controlled type 2 diabetes mellitus with diabetic neuropathy, without long-term current use of insulin 01/04/2007   Labile hypertension 01/04/2007   ALLERGIC RHINITIS 01/04/2007   HEADACHE 01/04/2007   Past Medical History:  Diagnosis Date   Allergic rhinitis    Anxiety    Asthma    hx bronchial asthma at times with upper resp infection   Depression    Fibromyalgia    GERD (gastroesophageal reflux disease)    Headache(784.0)    migraine and cluster headaches   Hyperlipemia    Hypertension    IBS (irritable bowel syndrome)    Menopause    Neuromuscular disorder 2009   hx of fibromyalgia, polyarthralsia (surgery induced)   Osteoarthritis    Rheumatoid arthritis    Complicated by osteoarthritis as well.   Rheumatoid arthritis    Stress incontinence    at times   Tibial fracture 10/14/2016   evulsion periostial right   Type 2 diabetes mellitus with complication, without long-term current use of insulin    diet controlled no meds -> however was diagnosed with a diabetic foot ulcer.   Past Surgical History:  Procedure Laterality Date   ABDOMINAL HYSTERECTOMY  12/1999   complete   CARPAL TUNNEL RELEASE Right 12/27/2017   Procedure: CARPAL TUNNEL RELEASE;  Surgeon: Dominica Severin, MD;  Location: [facility];  Service: Orthopedics;  Laterality: Right;   CERVICAL CONIZATION W/BX  june 1990   dysplasia of cervix/used 5Fu cream for 3 months   CERVICAL LAMINECTOMY  2005 & 2009   X 2   NO ROM PROBLEMS   CESAREAN SECTION  1989 & 1993   X 2   CHOLECYSTECTOMY  1986   DILATION AND CURETTAGE OF UTERUS   [phone]   missed abortion   ESOPHAGOGASTRODUODENOSCOPY  06/29/2011   Procedure: ESOPHAGOGASTRODUODENOSCOPY (EGD);  Surgeon: Kandis Cocking, MD;  Location: [facility];  Service: General;   Laterality: N/A;   FOOT SURGERY  2005   RT HEEL   GASTRIC BANDING PORT REVISION  09/24/2011   Procedure: GASTRIC BANDING PORT REVISION;  Surgeon: Valarie Merino, MD;  Location: [facility];  Service: General;  Laterality: N/A;   GASTRIC ROUX-EN-Y N/A 06/06/2017   Procedure: 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;  Surgeon: Luretha Murphy, MD;  Location: [facility];  Service: General;  Laterality: N/A;   HYSTEROSCOPY  1999   LAPAROSCOPIC GASTRIC BANDING  12/30/09   LAPAROSCOPIC GASTRIC SLEEVE RESECTION N/A 07/02/2013   Procedure: LAPAROSCOPIC GASTRIC SLEEVE RESECTION upper endoscopy;  Surgeon: Valarie Merino, MD;  Location: [facility];  Service: General;  Laterality: N/A;   LAPAROSCOPIC REPAIR AND REMOVAL OF GASTRIC BAND N/A 07/02/2013   Procedure: LAPAROSCOPIC REMOVAL OF GASTRIC BAND;  Surgeon: Valarie Merino, MD;  Location: [facility];  Service: General;  Laterality: N/A;   LAPAROSCOPIC REVISION OF GASTRIC BAND  07/03/2012   Procedure: LAPAROSCOPIC REVISION OF GASTRIC BAND;  Surgeon: Valarie Merino, MD;  Location: [facility];  Service: General;  Laterality: N/A;  removal of old lap. band port,  replaced with AP standard band   LAPAROSCOPY  09/24/2011   Procedure: LAPAROSCOPY DIAGNOSTIC;  Surgeon: Valarie Merino, MD;  Location: [facility];  Service: General;  Laterality: N/A;   LAPAROSCOPY  07/03/2012   Procedure: LAPAROSCOPY DIAGNOSTIC;  Surgeon: Valarie Merino, MD;  Location: [facility];  Service: General;  Laterality: N/A;  Exploratory Laparoscopy    MESH APPLIED TO LAP PORT  07/03/2012   Procedure: MESH APPLIED TO LAP PORT;  Surgeon: Valarie Merino, MD;  Location: [facility];  Service: General;  Laterality: N/A;   NASAL SINUS SURGERY  1995 & 1997   X 2   right knee  1981   ARTHROSCOPY AND ARTHROTOMY   right knee arthroscopy and arthrotomy  12-1979   TONSILLECTOMY  1971   TUBAL LIGATION  1993   WITH C -SECTION   Social History   Tobacco Use   Smoking status:  Never   Smokeless tobacco: Never  Vaping Use   Vaping Use: Never used  Substance Use Topics   Alcohol use: Not Currently   Drug use: Never   Social History   Socioeconomic History   Marital status: Married    Spouse name: Modine Oppenheimer   Number of children: 2   Years of education: Not on file   Highest education level: Professional school degree (e.g., MD, DDS, DVM, JD)  Occupational History   Occupation: Teacher, adult education: [employer]  Tobacco Use   Smoking status: Never   Smokeless tobacco: Never  Vaping Use   Vaping Use: Never used  Substance and Sexual Activity   Alcohol use: Not Currently   Drug use: Never   Sexual activity: Yes    Birth control/protection: None  Other Topics Concern   Not on file  Social History Narrative   Right handed   Two story home   Drinks caffeine occasionally   Social Determinants of Health   Financial Resource Strain: Low Risk  (10/19/2022)   Overall Financial Resource Strain (CARDIA)    Difficulty of Paying Living Expenses: Not hard at all  Food Insecurity: No Food Insecurity (10/19/2022)   Hunger Vital Sign    Worried About Running Out of Food in the Last Year: Never true    Ran Out of Food in the Last Year: Never true  Transportation Needs: No Transportation Needs (10/19/2022)   PRAPARE - Administrator, Civil Service (Medical): No    Lack of Transportation (Non-Medical): No  Physical Activity: Insufficiently Active (10/19/2022)   Exercise Vital Sign    Days of Exercise per Week: 2 days    Minutes of Exercise per Session: 20 min  Stress: No Stress Concern Present (10/19/2022)   [employer] of Occupational Health - Occupational Stress Questionnaire    Feeling of Stress : Not at all  Social Connections: Moderately Isolated (10/19/2022)   Social Connection and Isolation Panel [NHANES]    Frequency of Communication with Friends and Family: More than three times a week    Frequency of Social Gatherings with  Friends and Family: Once a week    Attends Religious Services: Never    Database administrator or Organizations: No    Attends Engineer, structural: Not on file    Marital Status: Married  Catering manager Violence: Not on file   Family Status  Relation Name Status   Mother  Deceased   Father  Deceased at age 48   Other  (Not Specified)  Other  (Not Specified)   Family History  Problem Relation Age of Onset   Diabetes Mother    Stroke Mother    Breast cancer Mother    Atrial fibrillation Mother    Hypertension Mother    Cancer Mother 41       breast   Diabetes Father    Stroke Father 62       2nd 6 month apart   Hypertension Father    Heart attack Father 97       stents   Dementia Father    AAA (abdominal aortic aneurysm) Father 2       repair   CAD Father 68   Thyroid disease Other    Heart disease Other    COPD Other    Allergies  Allergen Reactions   Gabapentin Swelling    Pt states she has eye swelling    Isoniazid     Severe SOB   Morphine Hives and Nausea And Vomiting    Allergic to PCA pump only    Nitrofurantoin Shortness Of Breath, Rash and Other (See Comments)    REACTION: welts   Tamiflu [Oseltamivir Phosphate] Nausea And Vomiting    "I vomited within 30 minutes of taking it and was told I cannot ever take it again."   Hctz [Hydrochlorothiazide] Rash   Macrolides And Ketolides Rash   Promethazine Hcl Other (See Comments)    IF GIVEN  IV-hallucinations     CAN TAKE PO PHENERGAN Other reaction(s): Unknown   Roxicet [Oxycodone-Acetaminophen] Itching   Wound Dressing Adhesive Rash    Adhesive Tape    Fentanyl Hives and Rash    REACTION: welts   Sulfamethoxazole-Trimethoprim Rash      Review of Systems  Constitutional:  Negative for fever and malaise/fatigue.  HENT:  Negative for congestion.   Eyes:  Negative for blurred vision.  Respiratory:  Negative for shortness of breath.   Cardiovascular:  Negative for chest pain,  palpitations and leg swelling.  Gastrointestinal:  Negative for abdominal pain, blood in stool and nausea.  Genitourinary:  Negative for dysuria and frequency.  Musculoskeletal:  Negative for falls.  Skin:  Negative for rash.  Neurological:  Negative for dizziness, loss of consciousness and headaches.  Endo/Heme/Allergies:  Negative for environmental allergies.  Psychiatric/Behavioral:  Negative for depression. The patient is not nervous/anxious.       Objective:     BP 128/68 (BP Location: Left Arm, Patient Position: Sitting, Cuff Size: Normal)   Pulse 71   Temp 97.8 F (36.6 C) (Oral)   Resp 18   Ht  (1.803 m)   Wt 201 lb 9.6 oz (91.4 kg)   SpO2 97%   BMI 28.12 kg/m  BP Readings from Last 3 Encounters:  10/22/22 128/68  09/08/22 139/62  08/06/22 134/65   Wt Readings from Last 3 Encounters:  10/22/22 201 lb 9.6 oz (91.4 kg)  08/06/22 200 lb 1.9 oz (90.8 kg)  07/23/22 201 lb 3.2 oz (91.3 kg)   SpO2 Readings from Last 3 Encounters:  10/22/22 97%  08/06/22 99%  07/23/22 95%      Physical Exam Vitals and nursing note reviewed.  Constitutional:      Appearance: She is well-developed.  HENT:     Head: Normocephalic and atraumatic.     Nose: Nose normal.  Eyes:     Conjunctiva/sclera: Conjunctivae normal.  Neck:     Thyroid: No thyromegaly.     Vascular: No carotid bruit or JVD.  Cardiovascular:     Rate and Rhythm: Normal rate and regular rhythm.     Heart sounds: Normal heart sounds. No murmur heard. Pulmonary:     Effort: Pulmonary effort is normal. No respiratory distress.     Breath sounds: Normal breath sounds. No wheezing or rales.  Chest:     Chest wall: No tenderness.  Musculoskeletal:        General: Tenderness present.     Cervical back: Normal range of motion and neck supple.  Neurological:     General: No focal deficit present.     Mental Status: She is alert and oriented to person, place, and time.  Psychiatric:        Mood and Affect:  Mood normal.     No results found for any visits on 10/22/22.  Last CBC Lab Results  Component Value Date   WBC 4.7 08/06/2022   HGB 13.6 08/06/2022   HCT 42.7 08/06/2022   MCV 94.1 08/06/2022   MCH 30.0 08/06/2022   RDW 12.0 08/06/2022   PLT 146 (L) 08/06/2022   Last metabolic panel Lab Results  Component Value Date   GLUCOSE 85 06/21/2022   NA 142 06/21/2022   K 3.7 06/21/2022   CL 103 06/21/2022   CO2 30 06/21/2022   BUN 10 06/21/2022   CREATININE 0.77 06/21/2022   GFRNONAA >60 06/09/2021   CALCIUM 9.2 06/21/2022   PROT 6.5 06/21/2022   ALBUMIN 4.2 06/21/2022   BILITOT 0.7 06/21/2022   ALKPHOS 83 06/21/2022   AST 44 (H) 06/21/2022   ALT 48 (H) 06/21/2022   ANIONGAP 7 06/09/2021   Last lipids Lab Results  Component Value Date   CHOL 119 04/22/2022   HDL 60.60 04/22/2022   LDLCALC 45 04/22/2022   TRIG 68.0 04/22/2022   CHOLHDL 2 04/22/2022   Last hemoglobin A1c Lab Results  Component Value Date   HGBA1C 5.9 04/22/2022   Last thyroid functions Lab Results  Component Value Date   TSH 1.67 10/22/2021   Last vitamin D Lab Results  Component Value Date   VD25OH 35.8 09/08/2020   Last vitamin B12 and Folate Lab Results  Component Value Date   VITAMINB12 207 05/11/2021   FOLATE 18.1 05/11/2021      The ASCVD Risk score (Arnett DK, et al., 2019) failed to calculate for the following reasons:   The valid total cholesterol range is 130 to 320 mg/dL    Assessment & Plan:   Problem List Items Addressed This Visit       Unprioritized   UNSPECIFIED VITAMIN D DEFICIENCY   Relevant Orders   VITAMIN D 25 Hydroxy (Vit-D Deficiency, Fractures)   Rheumatoid arthritis involving multiple sites with positive rheumatoid factor   Relevant Medications   cyclobenzaprine (FLEXERIL) 10 MG tablet   Other Visit Diagnoses     Essential hypertension    -  Primary   Relevant Medications   rosuvastatin (CRESTOR) 20 MG tablet   Other Relevant Orders   CBC with  Differential/Platelet   Comprehensive metabolic panel   Lipid panel   Hyperlipidemia, unspecified hyperlipidemia type       Relevant Medications   rosuvastatin (CRESTOR) 20 MG tablet   Other Relevant Orders   Comprehensive metabolic panel   Lipid panel   Other insomnia       Relevant Medications   temazepam (RESTORIL) 30 MG capsule   Other Relevant Orders   TSH   Controlled type 2 diabetes mellitus with complication, without long-term  current use of insulin       Relevant Medications   rosuvastatin (CRESTOR) 20 MG tablet   Other Relevant Orders   Hemoglobin A1c   Vitamin B12 deficiency       Relevant Orders   Vitamin B12       No follow-ups on file.    Donato Schultz, DO

## 2022-10-24 ENCOUNTER — Other Ambulatory Visit: Payer: Self-pay | Admitting: Family Medicine

## 2022-10-24 DIAGNOSIS — R748 Abnormal levels of other serum enzymes: Secondary | ICD-10-CM

## 2022-10-25 ENCOUNTER — Encounter: Payer: Self-pay | Admitting: *Deleted

## 2022-10-28 ENCOUNTER — Encounter: Payer: Self-pay | Admitting: Rheumatology

## 2022-10-28 ENCOUNTER — Other Ambulatory Visit: Payer: Self-pay

## 2022-10-28 ENCOUNTER — Other Ambulatory Visit (HOSPITAL_BASED_OUTPATIENT_CLINIC_OR_DEPARTMENT_OTHER): Payer: Self-pay

## 2022-11-03 ENCOUNTER — Other Ambulatory Visit: Payer: Self-pay

## 2022-11-03 ENCOUNTER — Other Ambulatory Visit (HOSPITAL_BASED_OUTPATIENT_CLINIC_OR_DEPARTMENT_OTHER): Payer: Self-pay

## 2022-11-04 ENCOUNTER — Other Ambulatory Visit: Payer: Self-pay | Admitting: Family Medicine

## 2022-11-04 ENCOUNTER — Other Ambulatory Visit (HOSPITAL_BASED_OUTPATIENT_CLINIC_OR_DEPARTMENT_OTHER): Payer: Self-pay

## 2022-11-04 ENCOUNTER — Ambulatory Visit (HOSPITAL_BASED_OUTPATIENT_CLINIC_OR_DEPARTMENT_OTHER)
Admission: RE | Admit: 2022-11-04 | Discharge: 2022-11-04 | Disposition: A | Discharge status: Home / Self Care | Payer: Commercial Managed Care - PPO | Source: Ambulatory Visit | Attending: Family Medicine | Admitting: Family Medicine

## 2022-11-04 DIAGNOSIS — R945 Abnormal results of liver function studies: Secondary | ICD-10-CM | POA: Diagnosis not present

## 2022-11-04 DIAGNOSIS — R748 Abnormal levels of other serum enzymes: Secondary | ICD-10-CM | POA: Diagnosis not present

## 2022-11-04 DIAGNOSIS — R5382 Chronic fatigue, unspecified: Secondary | ICD-10-CM

## 2022-11-04 MED ORDER — COMIRNATY 30 MCG/0.3ML IM SUSY
0.3000 mL | PREFILLED_SYRINGE | Freq: Once | INTRAMUSCULAR | 0 refills | Status: AC
  Filled 2022-11-04: qty 0.3, 1d supply, fill #0

## 2022-11-10 DIAGNOSIS — M0609 Rheumatoid arthritis without rheumatoid factor, multiple sites: Secondary | ICD-10-CM | POA: Diagnosis not present

## 2022-11-11 LAB — LAB REPORT - SCANNED: EGFR: 89

## 2022-11-15 ENCOUNTER — Other Ambulatory Visit: Payer: Self-pay

## 2022-11-15 ENCOUNTER — Other Ambulatory Visit (HOSPITAL_BASED_OUTPATIENT_CLINIC_OR_DEPARTMENT_OTHER): Payer: Self-pay

## 2022-11-18 ENCOUNTER — Other Ambulatory Visit: Payer: Self-pay | Admitting: Family Medicine

## 2022-11-18 ENCOUNTER — Other Ambulatory Visit (HOSPITAL_BASED_OUTPATIENT_CLINIC_OR_DEPARTMENT_OTHER): Payer: Self-pay

## 2022-11-18 ENCOUNTER — Encounter: Payer: Self-pay | Admitting: Family

## 2022-11-18 DIAGNOSIS — I10 Essential (primary) hypertension: Secondary | ICD-10-CM

## 2022-11-18 MED ORDER — POTASSIUM CHLORIDE CRYS ER 10 MEQ PO TBCR
10.0000 meq | EXTENDED_RELEASE_TABLET | Freq: Every day | ORAL | 1 refills | Status: DC
Start: 2022-11-18 — End: 2023-04-22
  Filled 2022-11-18: qty 90, 90d supply, fill #0

## 2022-12-02 ENCOUNTER — Telehealth: Payer: Self-pay | Admitting: Family Medicine

## 2022-12-02 ENCOUNTER — Other Ambulatory Visit (HOSPITAL_BASED_OUTPATIENT_CLINIC_OR_DEPARTMENT_OTHER): Payer: Self-pay

## 2022-12-02 DIAGNOSIS — M4326 Fusion of spine, lumbar region: Secondary | ICD-10-CM | POA: Diagnosis not present

## 2022-12-02 MED ORDER — CYCLOBENZAPRINE HCL 10 MG PO TABS
10.0000 mg | ORAL_TABLET | Freq: Three times a day (TID) | ORAL | 2 refills | Status: DC | PRN
  Filled 2022-12-02: qty 90, 30d supply, fill #0

## 2022-12-02 MED ORDER — METHOCARBAMOL 500 MG PO TABS
500.0000 mg | ORAL_TABLET | Freq: Two times a day (BID) | ORAL | 2 refills | Status: DC | PRN
  Filled 2022-12-02: qty 60, 30d supply, fill #0

## 2022-12-02 NOTE — Telephone Encounter (Signed)
Received. Placed in folder 

## 2022-12-02 NOTE — Telephone Encounter (Signed)
Pt came in office and dropped off copy of her lab results from another provider, pt is wanting PCP to look over results and add it on pt's chart. Document put at front office tray under providers name.

## 2022-12-07 ENCOUNTER — Other Ambulatory Visit: Payer: Self-pay

## 2022-12-07 ENCOUNTER — Other Ambulatory Visit (HOSPITAL_BASED_OUTPATIENT_CLINIC_OR_DEPARTMENT_OTHER): Payer: Self-pay

## 2022-12-09 ENCOUNTER — Other Ambulatory Visit (HOSPITAL_BASED_OUTPATIENT_CLINIC_OR_DEPARTMENT_OTHER): Payer: Self-pay

## 2022-12-09 MED ORDER — PREGABALIN 150 MG PO CAPS
150.0000 mg | ORAL_CAPSULE | Freq: Three times a day (TID) | ORAL | 2 refills | Status: DC
  Filled 2022-12-09 – 2022-12-14 (×2): qty 90, 30d supply, fill #0
  Filled 2023-01-09: qty 20, 7d supply, fill #1
  Filled 2023-01-10: qty 70, 23d supply, fill #1
  Filled 2023-05-19: qty 90, 30d supply, fill #2

## 2022-12-10 ENCOUNTER — Encounter (HOSPITAL_COMMUNITY): Payer: Self-pay | Admitting: Pharmacy Technician

## 2022-12-10 ENCOUNTER — Other Ambulatory Visit (HOSPITAL_BASED_OUTPATIENT_CLINIC_OR_DEPARTMENT_OTHER): Payer: Self-pay

## 2022-12-10 ENCOUNTER — Encounter: Payer: Self-pay | Admitting: Family

## 2022-12-10 ENCOUNTER — Telehealth: Payer: Self-pay | Admitting: Pharmacy Technician

## 2022-12-10 ENCOUNTER — Other Ambulatory Visit (HOSPITAL_COMMUNITY): Payer: Self-pay

## 2022-12-10 DIAGNOSIS — M0609 Rheumatoid arthritis without rheumatoid factor, multiple sites: Secondary | ICD-10-CM | POA: Diagnosis not present

## 2022-12-10 LAB — HEPATIC FUNCTION PANEL
ALT: 39 U/L — AB (ref 7–35)
AST: 35 (ref 13–35)
Alkaline Phosphatase: 72 (ref 25–125)
Bilirubin, Total: 0.6

## 2022-12-10 LAB — CBC AND DIFFERENTIAL
HCT: 40 (ref 36–46)
Hemoglobin: 13 (ref 12.0–16.0)
Neutrophils Absolute: 3.3
Platelets: 130 10*3/uL — AB (ref 150–400)
WBC: 5.4

## 2022-12-10 LAB — BASIC METABOLIC PANEL
BUN: 9 (ref 4–21)
CO2: 24 — AB (ref 13–22)
Chloride: 101 (ref 99–108)
Creatinine: 0.9 (ref 0.5–1.1)
Glucose: 88
Potassium: 4.1 mEq/L (ref 3.5–5.1)
Sodium: 141 (ref 137–147)

## 2022-12-10 LAB — COMPREHENSIVE METABOLIC PANEL
Albumin: 4.4 (ref 3.5–5.0)
Calcium: 9.4 (ref 8.7–10.7)
Globulin: 2.3
eGFR: 73

## 2022-12-10 LAB — POCT ERYTHROCYTE SEDIMENTATION RATE, NON-AUTOMATED: Sed Rate: 2

## 2022-12-10 LAB — CBC: RBC: 4.29 (ref 3.87–5.11)

## 2022-12-10 NOTE — Telephone Encounter (Signed)
error 

## 2022-12-10 NOTE — Telephone Encounter (Signed)
Patient Advocate Encounter   Received notification that prior authorization for Ubrelvy 100MG  tablets is required.   PA submitted on 12/10/2022 Key [id number] Insurance [employer] ePA Form  Status is pending

## 2022-12-14 ENCOUNTER — Other Ambulatory Visit: Payer: Self-pay

## 2022-12-14 ENCOUNTER — Other Ambulatory Visit (HOSPITAL_BASED_OUTPATIENT_CLINIC_OR_DEPARTMENT_OTHER): Payer: Self-pay

## 2022-12-15 ENCOUNTER — Other Ambulatory Visit: Payer: Self-pay | Admitting: Family Medicine

## 2022-12-15 ENCOUNTER — Ambulatory Visit (INDEPENDENT_AMBULATORY_CARE_PROVIDER_SITE_OTHER): Payer: Commercial Managed Care - PPO | Admitting: Podiatry

## 2022-12-15 ENCOUNTER — Other Ambulatory Visit (HOSPITAL_BASED_OUTPATIENT_CLINIC_OR_DEPARTMENT_OTHER): Payer: Self-pay

## 2022-12-15 ENCOUNTER — Encounter: Payer: Self-pay | Admitting: Podiatry

## 2022-12-15 ENCOUNTER — Other Ambulatory Visit: Payer: Self-pay | Admitting: Podiatry

## 2022-12-15 VITALS — BP 118/66

## 2022-12-15 DIAGNOSIS — M79675 Pain in left toe(s): Secondary | ICD-10-CM | POA: Diagnosis not present

## 2022-12-15 DIAGNOSIS — B351 Tinea unguium: Secondary | ICD-10-CM

## 2022-12-15 DIAGNOSIS — I10 Essential (primary) hypertension: Secondary | ICD-10-CM

## 2022-12-15 DIAGNOSIS — M79674 Pain in right toe(s): Secondary | ICD-10-CM | POA: Diagnosis not present

## 2022-12-15 DIAGNOSIS — E1142 Type 2 diabetes mellitus with diabetic polyneuropathy: Secondary | ICD-10-CM

## 2022-12-15 MED ORDER — FUROSEMIDE 20 MG PO TABS
20.00 mg | ORAL_TABLET | Freq: Every morning | ORAL | 0 refills | Status: DC
Start: 2022-12-15 — End: 2023-01-14
  Filled 2022-12-15 – 2022-12-17 (×2): qty 90, 90d supply, fill #0

## 2022-12-15 NOTE — Progress Notes (Signed)
  Subjective:  Patient ID: [name], female    DOB: 1955-11-07,  MRN: [id number]  [name] presents to clinic today for follow up diabetic foot care. Today, she presents with right 2nd digit fissure check. Chief Complaint  Patient presents with   Nail Problem    Right 2nd toe fisher ck,DFC,Referring Provider Zola Button, Grayling Congress, DO,LOV:04/24,BS:84 today,A1C:5.5      New problem(s): None.   PCP is Zola Button, Brocton R, DO.  Allergies  Allergen Reactions   Gabapentin Swelling    Pt states she has eye swelling    Isoniazid     Severe SOB   Morphine Hives and Nausea And Vomiting    Allergic to PCA pump only    Nitrofurantoin Shortness Of Breath, Rash and Other (See Comments)    REACTION: welts   Tamiflu [Oseltamivir Phosphate] Nausea And Vomiting    "I vomited within 30 minutes of taking it and was told I cannot ever take it again."   Hctz [Hydrochlorothiazide] Rash   Macrolides And Ketolides Rash    rash, rash   Promethazine Hcl Other (See Comments)    IF GIVEN  IV-hallucinations     CAN TAKE PO PHENERGAN Other reaction(s): Unknown   Roxicet [Oxycodone-Acetaminophen] Itching   Wound Dressing Adhesive Rash    Adhesive Tape    Fentanyl Hives and Rash    REACTION: welts   Sulfamethoxazole-Trimethoprim Rash   Review of Systems: Negative except as noted in the HPI.  Objective: No changes noted in today's physical examination. Vitals:   12/15/22 0821  BP: 118/66   [name] is a pleasant 67 y.o. female in NAD. AAO x 3.  Vascular Examination: Capillary refill time immediate b/l. Vascular status intact b/l with palpable pedal pulses. Pedal hair present b/l. No edema. No pain with calf compression b/l. Skin temperature gradient WNL b/l.   Neurological Examination: Sensation grossly intact b/l with 10 gram monofilament. Vibratory sensation intact b/l. Pt has subjective symptoms of neuropathy.  Dermatological Examination: Pedal skin with normal  turgor, texture and tone b/l.  No open wounds. No interdigital macerations.   Toenails 1-5 b/l dystrophic. Right 2nd digit improved.  No hyperkeratotic nor porokeratotic lesions present on today's visit.  Musculoskeletal Examination: Muscle strength 5/5 to all lower extremity muscle groups bilaterally. HAV with bunion deformity noted b/l LE. Clawtoe deformity 1-5 bilaterally.  Radiographs: None  Assessment/Plan: 1. Pain due to onychomycosis of toenails of both feet   2. Diabetic peripheral neuropathy associated with type 2 diabetes mellitus (HCC)     -Consent given for treatment as described below: -Examined patient. -Continue supportive shoe gear daily. -Toenails 1-5 b/l were debrided in length and girth with sterile nail nippers and dremel without iatrogenic bleeding.  -Patient/POA to call should there be question/concern in the interim.   Return in about 3 months (around 03/17/2023).  Freddie Breech, DPM

## 2022-12-15 NOTE — Telephone Encounter (Signed)
Patient Advocate Encounter  Prior Authorization for Bernita Raisin 100MG  tablets has been approved through [employer].    Key: [id number]  Effective: 12-14-2022 to 12-13-2023

## 2022-12-16 ENCOUNTER — Other Ambulatory Visit (HOSPITAL_BASED_OUTPATIENT_CLINIC_OR_DEPARTMENT_OTHER): Payer: Self-pay

## 2022-12-16 ENCOUNTER — Telehealth: Payer: Self-pay | Admitting: Family Medicine

## 2022-12-16 MED ORDER — MUPIROCIN 2 % EX OINT
TOPICAL_OINTMENT | CUTANEOUS | 1 refills | Status: DC
  Filled 2022-12-16: qty 22, 30d supply, fill #0
  Filled 2023-01-09: qty 22, 30d supply, fill #1

## 2022-12-16 NOTE — Telephone Encounter (Signed)
Pt dropped off copy of lab results from 12-10-2022 [facility]. Copies in white envelope. Document put at front office tray under providers name.

## 2022-12-17 ENCOUNTER — Other Ambulatory Visit: Payer: Self-pay | Admitting: Family Medicine

## 2022-12-17 ENCOUNTER — Other Ambulatory Visit (HOSPITAL_BASED_OUTPATIENT_CLINIC_OR_DEPARTMENT_OTHER): Payer: Self-pay

## 2022-12-17 DIAGNOSIS — E118 Type 2 diabetes mellitus with unspecified complications: Secondary | ICD-10-CM

## 2022-12-18 ENCOUNTER — Other Ambulatory Visit (HOSPITAL_BASED_OUTPATIENT_CLINIC_OR_DEPARTMENT_OTHER): Payer: Self-pay

## 2022-12-20 ENCOUNTER — Encounter (HOSPITAL_BASED_OUTPATIENT_CLINIC_OR_DEPARTMENT_OTHER): Payer: Self-pay

## 2022-12-20 ENCOUNTER — Encounter: Payer: Self-pay | Admitting: Family

## 2022-12-20 ENCOUNTER — Other Ambulatory Visit (HOSPITAL_BASED_OUTPATIENT_CLINIC_OR_DEPARTMENT_OTHER): Payer: Self-pay

## 2022-12-20 ENCOUNTER — Encounter: Payer: Self-pay | Admitting: *Deleted

## 2022-12-20 ENCOUNTER — Other Ambulatory Visit: Payer: Self-pay

## 2022-12-20 MED ORDER — OZEMPIC (2 MG/DOSE) 8 MG/3ML ~~LOC~~ SOPN
2.0000 mg | PEN_INJECTOR | SUBCUTANEOUS | 3 refills | Status: DC
Start: 2022-12-20 — End: 2023-04-06
  Filled 2022-12-20: qty 9, 84d supply, fill #0
  Filled 2023-03-04: qty 3, 28d supply, fill #1

## 2022-12-20 NOTE — Telephone Encounter (Signed)
These have been abstracted and sent to provider to review.

## 2022-12-23 ENCOUNTER — Other Ambulatory Visit: Payer: Self-pay

## 2022-12-23 ENCOUNTER — Other Ambulatory Visit (HOSPITAL_BASED_OUTPATIENT_CLINIC_OR_DEPARTMENT_OTHER): Payer: Self-pay

## 2022-12-23 ENCOUNTER — Ambulatory Visit
Admission: EM | Admit: 2022-12-23 | Discharge: 2022-12-23 | Disposition: A | Discharge status: Home / Self Care | Payer: Commercial Managed Care - PPO | Attending: Family Medicine | Admitting: Family Medicine

## 2022-12-23 DIAGNOSIS — L03116 Cellulitis of left lower limb: Secondary | ICD-10-CM | POA: Diagnosis not present

## 2022-12-23 MED ORDER — DOXYCYCLINE HYCLATE 100 MG PO CAPS
100.0000 mg | ORAL_CAPSULE | Freq: Two times a day (BID) | ORAL | 0 refills | Status: DC
  Filled 2022-12-23: qty 14, 7d supply, fill #0

## 2022-12-23 NOTE — ED Provider Notes (Signed)
[facility]    CSN: [id number] Arrival date & time: 12/23/22  0801      History   Chief Complaint Chief Complaint  Patient presents with   Wound Check    HPI [name] is a 67 y.o. female.   Patient bumped her left lower anterior leg on a plastic box five days ago, resulting in a superficial abrasion.  The area has remained mildly tender to palpation and last night developed surrounding warmth.  She has been applying mupirocin ointment with bandage daily.  The history is provided by the patient.  Wound Check This is a new problem. The current episode started more than 2 days ago. The problem occurs constantly. The problem has been gradually worsening. Nothing aggravates the symptoms. Nothing relieves the symptoms. Treatments tried: antibiotic ointment. The treatment provided mild relief.    Past Medical History:  Diagnosis Date   Allergic rhinitis    Anxiety    Asthma    hx bronchial asthma at times with upper resp infection   Depression    Fibromyalgia    GERD (gastroesophageal reflux disease)    Headache(784.0)    migraine and cluster headaches   Hyperlipemia    Hypertension    IBS (irritable bowel syndrome)    Menopause    Neuromuscular disorder (HCC) 2009   hx of fibromyalgia, polyarthralsia (surgery induced)   Osteoarthritis    Rheumatoid arthritis (HCC)    Complicated by osteoarthritis as well.   Rheumatoid arthritis (HCC)    Stress incontinence    at times   Tibial fracture 10/14/2016   evulsion periostial right   Type 2 diabetes mellitus with complication, without long-term current use of insulin (HCC)    diet controlled no meds -> however was diagnosed with a diabetic foot ulcer.    Patient Active Problem List   Diagnosis Date Noted   Family history of abdominal aortic aneurysm (AAA) repair 07/23/2022   Synovitis of toe 04/19/2022   Preventative health care 10/22/2021   Colon cancer screening 08/05/2021   Diabetic ulcer of toe  of right foot associated with type 2 diabetes mellitus, limited to breakdown of skin (HCC) 06/19/2021   Tinea corporis 06/19/2021   Dysuria 06/19/2021   IDA (iron deficiency anemia) 05/18/2021   Pre-operative clearance 04/21/2021   Controlled type 2 diabetes mellitus with hyperglycemia, without long-term current use of insulin (HCC) 04/21/2021   Spinal stenosis of lumbar region 04/21/2021   Bronchitis due to COVID-19 virus 03/27/2021   Orthostatic hypotension 01/09/2021   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   Spondylosis of cervical spine 10/07/2020   Lumbar radiculopathy 10/07/2020   Anemia 09/11/2020   Intractable migraine 08/26/2020   Inflammatory arthritis 07/22/2020   Thrombocytopenia (HCC) 07/22/2020   Chronic nonintractable headache 07/21/2020   Syncope 07/21/2020   Dizziness 07/21/2020   Cellulitis of right hand 07/21/2020   Lumbar spondylolysis 05/13/2020   Primary hypertension 04/02/2020   Body mass index (BMI) 29.0-29.9, adult 04/02/2020   Menopausal symptom 03/11/2020   Stress 03/11/2020   Unspecified dyspareunia (CODE) 03/11/2020   Bilateral sacroiliitis (HCC) 03/04/2020   Senile calcific aortic valve sclerosis 02/08/2020   DOE (dyspnea on exertion) 02/08/2020   Murmur 01/07/2020   Acquired trigger finger of right ring finger 10/25/2019   Pain in right knee 08/24/2019   Rheumatoid arthritis involving multiple sites with positive rheumatoid factor (HCC) 08/02/2019   Primary osteoarthritis of both knees 08/02/2019   PTSD (post-traumatic stress disorder) 02/08/2019   Dog  bite of arm, right, initial encounter 10/05/2018   Hyperlipidemia associated with type 2 diabetes mellitus (HCC) 08/24/2018   Symptomatic abdominal panniculus 12/12/2017   Carpal tunnel syndrome of left wrist 11/28/2017   Pain in right hand 11/08/2017   Cervical radiculopathy 10/06/2017   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   Keratosis 12/04/2014   Onychocryptosis 08/21/2014    Onychomycosis 05/21/2014   Fissured skin 01/22/2014   Fissure in skin of foot 12/18/2013   Porokeratosis 12/18/2013   Pain in lower limb 11/21/2013   Ingrown nail 07/20/2013   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 07/02/2013   Obesity (BMI 30-39.9) 06/25/2013   GERD (gastroesophageal reflux disease) 08/20/2011   Lapband APL + HH repair June 2011-Major revision Dec 2013 06/29/2011   ABDOMINAL PAIN, ACUTE 08/01/2010   VITAMIN B12 DEFICIENCY 04/01/2010   BARIATRIC SURGERY STATUS 01/29/2010   ONYCHOMYCOSIS, BILATERAL 06/30/2009   STRESS INCONTINENCE 06/10/2009   ACUTE PHARYNGITIS 04/29/2009   COUGH 04/27/2009   BRONCHITIS, ACUTE 04/16/2009   NAUSEA 04/08/2009   DIARRHEA 04/08/2009   MORBID OBESITY 03/03/2009   SKIN RASH 11/18/2008   UNSPECIFIED VITAMIN D DEFICIENCY 07/15/2008   OTHER SPECIFIED ANEMIAS 07/15/2008   Pain in joint, multiple sites 06/13/2008   Myalgia and myositis, unspecified 06/13/2008   Myalgia 06/13/2008   BACK PAIN, LUMBAR 02/14/2008   ACUTE SINUSITIS, UNSPECIFIED 09/01/2007   CELLULITIS 08/12/2007   Cellulitis 08/12/2007   Depression with anxiety 06/09/2007   DEPRESSION 06/09/2007   Controlled type 2 diabetes mellitus with diabetic neuropathy, without long-term current use of insulin (HCC) 01/04/2007   Labile hypertension 01/04/2007   ALLERGIC RHINITIS 01/04/2007   HEADACHE 01/04/2007    Past Surgical History:  Procedure Laterality Date   ABDOMINAL HYSTERECTOMY  12/1999   complete   CARPAL TUNNEL RELEASE Right 12/27/2017   Procedure: CARPAL TUNNEL RELEASE;  Surgeon: Dominica Severin, MD;  Location: [facility];  Service: Orthopedics;  Laterality: Right;   CERVICAL CONIZATION W/BX  june 1990   dysplasia of cervix/used 5Fu cream for 3 months   CERVICAL LAMINECTOMY  2005 & 2009   X 2   NO ROM PROBLEMS   CESAREAN SECTION  1989 & 1993   X 2   CHOLECYSTECTOMY  1986   DILATION AND CURETTAGE OF UTERUS   [phone]   missed abortion    ESOPHAGOGASTRODUODENOSCOPY  06/29/2011   Procedure: ESOPHAGOGASTRODUODENOSCOPY (EGD);  Surgeon: Kandis Cocking, MD;  Location: [facility];  Service: General;  Laterality: N/A;   FOOT SURGERY  2005   RT HEEL   GASTRIC BANDING PORT REVISION  09/24/2011   Procedure: GASTRIC BANDING PORT REVISION;  Surgeon: Valarie Merino, MD;  Location: [facility];  Service: General;  Laterality: N/A;   GASTRIC ROUX-EN-Y N/A 06/06/2017   Procedure: 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;  Surgeon: Luretha Murphy, MD;  Location: [facility];  Service: General;  Laterality: N/A;   HYSTEROSCOPY  1999   LAPAROSCOPIC GASTRIC BANDING  12/30/09   LAPAROSCOPIC GASTRIC SLEEVE RESECTION N/A 07/02/2013   Procedure: LAPAROSCOPIC GASTRIC SLEEVE RESECTION upper endoscopy;  Surgeon: Valarie Merino, MD;  Location: [facility];  Service: General;  Laterality: N/A;   LAPAROSCOPIC REPAIR AND REMOVAL OF GASTRIC BAND N/A 07/02/2013   Procedure: LAPAROSCOPIC REMOVAL OF GASTRIC BAND;  Surgeon: Valarie Merino, MD;  Location: [facility];  Service: General;  Laterality: N/A;   LAPAROSCOPIC REVISION OF GASTRIC BAND  07/03/2012   Procedure: LAPAROSCOPIC REVISION OF GASTRIC BAND;  Surgeon:  Valarie Merino, MD;  Location: [facility];  Service: General;  Laterality: N/A;  removal of old lap. band port, replaced with AP standard band   LAPAROSCOPY  09/24/2011   Procedure: LAPAROSCOPY DIAGNOSTIC;  Surgeon: Valarie Merino, MD;  Location: [facility];  Service: General;  Laterality: N/A;   LAPAROSCOPY  07/03/2012   Procedure: LAPAROSCOPY DIAGNOSTIC;  Surgeon: Valarie Merino, MD;  Location: [facility];  Service: General;  Laterality: N/A;  Exploratory Laparoscopy    MESH APPLIED TO LAP PORT  07/03/2012   Procedure: MESH APPLIED TO LAP PORT;  Surgeon: Valarie Merino, MD;  Location: [facility];  Service: General;  Laterality: N/A;   NASAL SINUS SURGERY  1995 & 1997   X 2   right knee  1981   ARTHROSCOPY AND ARTHROTOMY    right knee arthroscopy and arthrotomy  12-1979   TONSILLECTOMY  1971   TUBAL LIGATION  1993   WITH C -SECTION    OB History     Gravida  3   Para  0   Term  0   Preterm  0   AB  1   Living  2      SAB  1   IAB  0   Ectopic  0   Multiple  0   Live Births               Home Medications    Prior to Admission medications   Medication Sig Start Date End Date Taking? Authorizing Provider  doxycycline (VIBRAMYCIN) 100 MG capsule Take one cap PO Q12hr with food. 12/23/22  Yes Lattie Haw, MD  acetaminophen (TYLENOL) 650 MG CR tablet Take by mouth.    [provider]  albuterol (VENTOLIN HFA) 108 (90 Base) MCG/ACT inhaler  04/21/21   [provider]  calcitonin, salmon, (MIACALCIN/FORTICAL) 200 UNIT/ACT nasal spray Place 1 spray into alternate nostrils daily. 09/08/22     Cholecalciferol 125 MCG (5000 UT) TABS take 1 tablet by oral route every day    [provider]  conjugated estrogens (PREMARIN) vaginal cream INSERT 1 GRAM of cream per vagina THREE TIMES A WEEK BY VAGINAL ROUTE. 08/18/21     conjugated estrogens (PREMARIN) vaginal cream Place 0.5 Applicatorfuls vaginally 3 (three) times a week. 09/03/22     Cyanocobalamin 3000 MCG/ML LIQD as directed Sublingual    [provider]  cyclobenzaprine (FLEXERIL) 10 MG tablet Take 1 tablet (10 mg total) by mouth before bedtime for muscle spasms. 09/08/22     cyclobenzaprine (FLEXERIL) 10 MG tablet Take 1 tablet (10 mg total) by mouth 3 (three) times daily as needed for muscle spasms. 10/22/22   Donato Schultz, DO  cyclobenzaprine (FLEXERIL) 10 MG tablet Take 1 tablet (10 mg total) by mouth every 8 (eight) hours as needed. 12/02/22     diclofenac Sodium (VOLTAREN) 1 % GEL Apply 4 g topically 4 (four) times daily. 08/25/22   Donato Schultz, DO  DULoxetine (CYMBALTA) 30 MG capsule Take 1 capsule (30 mg total) by mouth 2 (two) times daily. 04/22/22   Zola Button, Yvonne R, DO   Efinaconazole (JUBLIA) 10 % SOLN APPLY TO AFFECTED TOENAILS ONCE DAILY FOR 48 WEEKS 03/09/22   Galaway, Lise Auer, DPM  Emollient (DERMEND BRUISE FORMULA) CREA as directed Externally 06/04/20   [provider]  estradiol (DOTTI) 0.1 MG/24HR patch Apply 1 patch twice a week by transdermal route. 09/13/22     ferric derisomaltose (MONOFERRIC)  1000 MG/10ML SOLN injection as directed Intravenous    [provider]  fluticasone (FLOVENT HFA) 110 MCG/ACT inhaler     [provider]  furosemide (LASIX) 20 MG tablet Take 1 tablet (20 mg total) by mouth every morning. 12/15/22   Lowne Chase, Yvonne R, DO  Galcanezumab-gnlm (EMGALITY) 120 MG/ML SOAJ INJECT 120 MG INTO THE SKIN EVERY 28 (TWENTY-EIGHT) DAYS. 09/24/22 09/24/23  Shon Millet R, DO  glucose blood test strip Test blood sugar once daily. Dx Code: E11.9 01/09/19   Zola Button, Grayling Congress, DO  hydrALAZINE (APRESOLINE) 25 MG tablet Take 1 tablet (25 mg total) by mouth 2 (two) times daily. Hold if feeling dizzy or SBP<110 mmHg).  Can use additional 25 mg as needed for SBP>150 mmHg 07/23/22   Marykay Lex, MD  hydroxychloroquine (PLAQUENIL) 200 MG tablet Take 2 tablets (400 mg total) by mouth daily. 01/18/22   Donato Schultz, DO  Hyoscyamine Sulfate SL (LEVSIN/SL) 0.125 MG SUBL Place 1 tablet under the tongue every 6 (six) hours as needed. 06/28/22   Donato Schultz, DO  methocarbamol (ROBAXIN) 500 MG tablet Take 1 tablet (500 mg total) by mouth 2 (two) times daily as needed. 12/02/22     Multiple Vitamins-Minerals (CENTRUM ADULTS) TABS take 2 tablets by oral route  every day with food    [provider]  mupirocin ointment (BACTROBAN) 2 % Apply to toes as needed 12/16/22   McCaughan, Dia D, DPM  nystatin (MYCOSTATIN/NYSTOP) powder APPLY 1 APPLICATION TOPICALLY 3 (THREE) TIMES DAILY. 04/22/22 04/22/23  Donato Schultz, DO  olmesartan (BENICAR) 20 MG tablet Take 1 tablet (20 mg total) by mouth daily with supper.  07/23/22   Marykay Lex, MD  pantoprazole (PROTONIX) 40 MG tablet Take 1 tablet (40 mg total) by mouth daily. 04/22/22   Donato Schultz, DO  potassium chloride (KLOR-CON M) 10 MEQ tablet Take 1 tablet (10 mEq total) by mouth daily. 11/18/22   Donato Schultz, DO  pregabalin (LYRICA) 150 MG capsule Take 1 capsule (150 mg total) by mouth 3 (three) times daily. Max Daily Amount: 450 mg. 12/09/22     rosuvastatin (CRESTOR) 20 MG tablet Take 1 tablet (20 mg total) by mouth daily. 10/22/22   Donato Schultz, DO  Semaglutide, 2 MG/DOSE, (OZEMPIC, 2 MG/DOSE,) 8 MG/3ML SOPN Inject 2 mg as directed once a week. 12/20/22   Seabron Spates R, DO  temazepam (RESTORIL) 30 MG capsule Take 1 capsule (30 mg total) by mouth at bedtime as needed for sleep. 10/22/22   Donato Schultz, DO  thiamine (VITAMIN B-1) 100 MG tablet Take 100 mg daily by mouth.    [provider]  Tocilizumab (ACTEMRA) 80 MG/4ML SOLN injection Inject into the vein. 04/22/22   Donato Schultz, DO  triamcinolone ointment (KENALOG) 0.1 % Apply 1 Application (a thin layer) topically in the morning and at bedtime. 09/02/22     Ubrogepant (UBRELVY) 100 MG TABS TAKE ONE TABLET BY MOUTH DAILY AS NEEDED FOR MIGRAINE. MAX 2 TABLETS IN 24 HOURS 06/11/22   Drema Dallas, DO    Family History Family History  Problem Relation Age of Onset   Diabetes Mother    Stroke Mother    Breast cancer Mother    Atrial fibrillation Mother    Hypertension Mother    Cancer Mother 71       breast   Diabetes Father  Stroke Father 67       2nd 6 month apart   Hypertension Father    Heart attack Father 62       stents   Dementia Father    AAA (abdominal aortic aneurysm) Father 38       repair   CAD Father 37   Thyroid disease Other    Heart disease Other    COPD Other     Social History Social History   Tobacco Use   Smoking status: Never   Smokeless tobacco: Never  Vaping Use   Vaping Use: Never used   Substance Use Topics   Alcohol use: Not Currently   Drug use: Never     Allergies   Gabapentin, Isoniazid, Morphine, Nitrofurantoin, Tamiflu [oseltamivir phosphate], Hctz [hydrochlorothiazide], Macrolides and ketolides, Promethazine hcl, Roxicet [oxycodone-acetaminophen], Wound dressing adhesive, Fentanyl, and Sulfamethoxazole-trimethoprim   Review of Systems Review of Systems  Constitutional:  Negative for chills, diaphoresis and fever.  Skin:  Positive for color change and wound.  All other systems reviewed and are negative.    Physical Exam Triage Vital Signs ED Triage Vitals  Enc Vitals Group     BP 12/23/22 0814 (!) 148/75     Pulse Rate 12/23/22 0814 69     Resp 12/23/22 0814 16     Temp 12/23/22 0814 98.4 F (36.9 C)     Temp src --      SpO2 12/23/22 0814 98 %     Weight --      Height --      Head Circumference --      Peak Flow --      Pain Score 12/23/22 0813 3     Pain Loc --      Pain Edu? --      Excl. in GC? --    No data found.  Updated Vital Signs BP (!) 148/75   Pulse 69   Temp 98.4 F (36.9 C)   Resp 16   SpO2 98%   Visual Acuity Right Eye Distance:   Left Eye Distance:   Bilateral Distance:    Right Eye Near:   Left Eye Near:    Bilateral Near:     Physical Exam Vitals and nursing note reviewed.  Constitutional:      General: She is not in acute distress. Eyes:     Pupils: Pupils are equal, round, and reactive to light.  Cardiovascular:     Rate and Rhythm: Normal rate.  Pulmonary:     Effort: Pulmonary effort is normal.  Musculoskeletal:       Legs:     Comments: Left medial pre-tibial area has a 2cm diameter superficial abrasion with mild surrounding erythema, warmth, and tenderness to palpation.  Minimal drainage from the wound.  Left lower leg has slight edema present.  Skin:    General: Skin is warm and dry.  Neurological:     Mental Status: She is alert.      UC Treatments / Results  Labs (all labs ordered  are listed, but only abnormal results are displayed) Labs Reviewed - No data to display  EKG   Radiology No results found.  Procedures Procedures (including critical care time)  Medications Ordered in UC Medications - No data to display  Initial Impression / Assessment and Plan / UC Course  I have reviewed the triage vital signs and the nursing notes.  Pertinent labs & imaging results that were available during my care of the  patient were reviewed by me and considered in my medical decision making (see chart for details).    Appears to have very mild cellulitis complicated by mild dependent edema.  Applied bacitracin and non-stick dressing followed by ace wraps to produce graduated compression.  Begin doxycycline 100mg  BID for one week. Followup with Family Doctor if not improved in one week.   Final Clinical Impressions(s) / UC Diagnoses   Final diagnoses:  Cellulitis of left lower leg     Discharge Instructions      Continue to change bandage daily and apply mupirocin ointment.  Use ace wraps to apply graduated compression for several days until improved.  If symptoms become significantly worse during the night or over the weekend, proceed to the local emergency room.     ED Prescriptions     Medication Sig Dispense Auth. Provider   doxycycline (VIBRAMYCIN) 100 MG capsule Take one cap PO Q12hr with food. 14 capsule Lattie Haw, MD         Lattie Haw, MD 12/23/22 [phone]

## 2022-12-23 NOTE — Discharge Instructions (Signed)
Continue to change bandage daily and apply mupirocin ointment.  Use ace wraps to apply graduated compression for several days until improved.  If symptoms become significantly worse during the night or over the weekend, proceed to the local emergency room.

## 2022-12-23 NOTE — ED Triage Notes (Signed)
[facility] to [facility] with [facility] of wound to Left anterior shin since Saturday. Pt reports she hit it on a plastic box and has been washing it 2 x daily with peroxide and bacitracin cream. Wound is still warm to tough.

## 2022-12-27 ENCOUNTER — Other Ambulatory Visit (HOSPITAL_BASED_OUTPATIENT_CLINIC_OR_DEPARTMENT_OTHER): Payer: Self-pay

## 2022-12-28 ENCOUNTER — Encounter: Payer: Self-pay | Admitting: Family Medicine

## 2022-12-29 MED ORDER — LEVOFLOXACIN 500 MG PO TABS: 500.0000 mg | ORAL_TABLET | Freq: Every day | ORAL | 0 refills | Status: DC

## 2023-01-06 ENCOUNTER — Other Ambulatory Visit: Payer: Self-pay | Admitting: Family Medicine

## 2023-01-06 DIAGNOSIS — F418 Other specified anxiety disorders: Secondary | ICD-10-CM

## 2023-01-07 ENCOUNTER — Other Ambulatory Visit: Payer: Self-pay

## 2023-01-07 ENCOUNTER — Other Ambulatory Visit (HOSPITAL_BASED_OUTPATIENT_CLINIC_OR_DEPARTMENT_OTHER): Payer: Self-pay

## 2023-01-07 DIAGNOSIS — M0609 Rheumatoid arthritis without rheumatoid factor, multiple sites: Secondary | ICD-10-CM | POA: Diagnosis not present

## 2023-01-07 MED ORDER — DULOXETINE HCL 30 MG PO CPEP
30.0000 mg | ORAL_CAPSULE | Freq: Two times a day (BID) | ORAL | 1 refills | Status: DC
Start: 2023-01-07 — End: 2023-04-22
  Filled 2023-01-07: qty 180, 90d supply, fill #0
  Filled 2023-04-06: qty 180, 90d supply, fill #1

## 2023-01-10 ENCOUNTER — Other Ambulatory Visit (HOSPITAL_BASED_OUTPATIENT_CLINIC_OR_DEPARTMENT_OTHER): Payer: Self-pay

## 2023-01-14 ENCOUNTER — Encounter: Payer: Self-pay | Admitting: Family Medicine

## 2023-01-14 ENCOUNTER — Ambulatory Visit: Payer: Commercial Managed Care - PPO | Admitting: Family Medicine

## 2023-01-14 ENCOUNTER — Other Ambulatory Visit (HOSPITAL_BASED_OUTPATIENT_CLINIC_OR_DEPARTMENT_OTHER): Payer: Self-pay

## 2023-01-14 VITALS — BP 120/60 | HR 72 | Temp 98.5°F | Resp 18 | Ht 71.0 in | Wt 191.0 lb

## 2023-01-14 DIAGNOSIS — H9209 Otalgia, unspecified ear: Secondary | ICD-10-CM | POA: Diagnosis not present

## 2023-01-14 DIAGNOSIS — B354 Tinea corporis: Secondary | ICD-10-CM

## 2023-01-14 DIAGNOSIS — G4709 Other insomnia: Secondary | ICD-10-CM

## 2023-01-14 DIAGNOSIS — I10 Essential (primary) hypertension: Secondary | ICD-10-CM

## 2023-01-14 MED ORDER — OFLOXACIN 0.3 % OT SOLN
10.0000 [drp] | Freq: Every day | OTIC | 0 refills | Status: DC
Start: 2023-01-14 — End: 2024-03-13
  Filled 2023-01-14: qty 5, 10d supply, fill #0

## 2023-01-14 MED ORDER — FUROSEMIDE 20 MG PO TABS
20.0000 mg | ORAL_TABLET | Freq: Every morning | ORAL | 3 refills | Status: DC
Start: 2023-01-14 — End: 2024-01-25
  Filled 2023-01-14 – 2023-03-17 (×2): qty 90, 90d supply, fill #0
  Filled 2023-06-15: qty 90, 90d supply, fill #1
  Filled 2023-09-14: qty 90, 90d supply, fill #2
  Filled 2023-12-09: qty 90, 90d supply, fill #3

## 2023-01-14 MED ORDER — NYSTATIN 100000 UNIT/GM EX POWD
1.0000 | Freq: Three times a day (TID) | CUTANEOUS | 5 refills | Status: AC
Start: 2023-01-14 — End: 2024-01-14
  Filled 2023-01-14: qty 15, 5d supply, fill #0
  Filled 2023-01-25: qty 15, 5d supply, fill #1
  Filled 2023-02-06: qty 15, 5d supply, fill #2
  Filled 2023-05-15: qty 15, 5d supply, fill #3
  Filled 2023-06-16: qty 15, 5d supply, fill #4
  Filled 2023-10-16: qty 15, 5d supply, fill #5

## 2023-01-14 MED ORDER — TEMAZEPAM 30 MG PO CAPS
30.0000 mg | ORAL_CAPSULE | Freq: Every evening | ORAL | 1 refills | Status: DC | PRN
Start: 2023-01-14 — End: 2023-04-22
  Filled 2023-01-14 – 2023-03-27 (×2): qty 90, 90d supply, fill #0

## 2023-01-14 NOTE — Patient Instructions (Signed)
Earache, Adult An earache, or ear pain, can be caused by many things, including: An infection. Ear wax buildup. Ear pressure. Something in the ear that should not be there (foreign body). A sore throat. Tooth problems. Jaw problems. Treatment of the earache will depend on the cause. If the cause is not clear or cannot be known, you may need to watch your symptoms until your earache goes away or until a cause is found. Follow these instructions at home: Medicines Take or apply over-the-counter and prescription medicines only as told by your health care provider. If you were prescribed antibiotics, use them as told by your health care provider. Do not stop using the antibiotic even if you start to feel better. Do not put anything in your ear other than medicine that is prescribed by your health care provider. Managing pain     If directed, apply heat to the affected area as often as told by your health care provider. Use the heat source that your health care provider recommends, such as a moist heat pack or a heating pad. Place a towel between your skin and the heat source. Leave the heat on for 20-30 minutes. If your skin turns bright red, remove the heat right away to prevent burns. The risk of burns is higher if you cannot feel pain, heat, or cold. If directed, put ice on the affected area. To do this: Put ice in a plastic bag. Place a towel between your skin and the bag. Leave the ice on for 20 minutes, 2-3 times a day. If your skin turns bright red, remove the ice right away to prevent skin damage. The risk of skin damage is higher if you cannot feel pain, heat, or cold.  General instructions Pay attention to any changes in your symptoms. Try resting in an upright position instead of lying down. This may help to reduce pressure in your ear and relieve pain. Chew gum if it helps to relieve your ear pain. Treat any allergies as told by your health care provider. Drink enough fluid  to keep your urine pale yellow. It is up to you to get the results of any tests that were done. Ask your health care provider, or the department that is doing the tests, when your results will be ready. Contact a health care provider if: Your pain does not improve within 2 days. Your earache gets worse. You have new symptoms. You have a fever. Get help right away if: You have a severe headache. You have a stiff neck. You have trouble swallowing. You have redness or swelling behind your ear. You have fluid or blood coming from your ear. You have hearing loss. You feel dizzy. This information is not intended to replace advice given to you by your health care provider. Make sure you discuss any questions you have with your health care provider. Document Revised: 11/09/2021 Document Reviewed: 11/09/2021 Elsevier Patient Education  2024 [employer]  

## 2023-01-14 NOTE — Progress Notes (Signed)
Established Patient Office Visit  Subjective   Patient ID: [name], female    DOB: [birth year]  Age: 67 y.o. MRN: [id number]  Chief Complaint  Patient presents with   Form   Ear Pain    Right ear, x2 weeks, pt states having pain. Pain comes and goes    HPI Discussed the use of AI scribe software for clinical note transcription with the patient, who gave verbal consent to proceed.  History of Present Illness   The patient, with a history of liver disease and gastrointestinal issues, presents for medication refills and to discuss multiple health concerns. She reports a suspected hernia above the belly button, previously examined by Dr. [name], and is considering surgery with Dr. Norwood Levo. She describes abnormal bowel movements, consisting of small, ball-like stools, but denies abdominal pain. She also reports a sense of urgency when needing to use the bathroom.  The patient also reports a trigger finger in her thumb joint, which has led to difficulty opening and pulling objects. She has an upcoming appointment with Dr. Amanda Pea for this issue. She also reports a recent slip and fall incident, which resulted in excruciating pain when bending her leg back. She has an appointment with Dr. Allie Bossier to evaluate this.  Additionally, the patient reports intermittent ear pain, which she suspects may be due to water in the ear from recent swimming. She also reports feeling lumps under her skin in her neck, which have been present for some time and occasionally become tender.      Patient Active Problem List   Diagnosis Date Noted   Family history of abdominal aortic aneurysm (AAA) repair 07/23/2022   Synovitis of toe 04/19/2022   Preventative health care 10/22/2021   Colon cancer screening 08/05/2021   Diabetic ulcer of toe of right foot associated with type 2 diabetes mellitus, limited to breakdown of skin (HCC) 06/19/2021   Tinea corporis 06/19/2021   Dysuria 06/19/2021   IDA  (iron deficiency anemia) 05/18/2021   Pre-operative clearance 04/21/2021   Controlled type 2 diabetes mellitus with hyperglycemia, without long-term current use of insulin (HCC) 04/21/2021   Spinal stenosis of lumbar region 04/21/2021   Bronchitis due to COVID-19 virus 03/27/2021   Orthostatic hypotension 01/09/2021   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   Spondylosis of cervical spine 10/07/2020   Lumbar radiculopathy 10/07/2020   Anemia 09/11/2020   Intractable migraine 08/26/2020   Inflammatory arthritis 07/22/2020   Thrombocytopenia (HCC) 07/22/2020   Chronic nonintractable headache 07/21/2020   Syncope 07/21/2020   Dizziness 07/21/2020   Cellulitis of right hand 07/21/2020   Lumbar spondylolysis 05/13/2020   Primary hypertension 04/02/2020   Body mass index (BMI) 29.0-29.9, adult 04/02/2020   Menopausal symptom 03/11/2020   Stress 03/11/2020   Unspecified dyspareunia (CODE) 03/11/2020   Bilateral sacroiliitis (HCC) 03/04/2020   Senile calcific aortic valve sclerosis 02/08/2020   DOE (dyspnea on exertion) 02/08/2020   Murmur 01/07/2020   Acquired trigger finger of right ring finger 10/25/2019   Pain in right knee 08/24/2019   Rheumatoid arthritis involving multiple sites with positive rheumatoid factor (HCC) 08/02/2019   Primary osteoarthritis of both knees 08/02/2019   PTSD (post-traumatic stress disorder) 02/08/2019   Dog bite of arm, right, initial encounter 10/05/2018   Hyperlipidemia associated with type 2 diabetes mellitus (HCC) 08/24/2018   Symptomatic abdominal panniculus 12/12/2017   Carpal tunnel syndrome of left wrist 11/28/2017   Pain in right hand 11/08/2017   Cervical radiculopathy 10/06/2017  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   Keratosis 12/04/2014   Onychocryptosis 08/21/2014   Onychomycosis 05/21/2014   Fissured skin 01/22/2014   Fissure in skin of foot 12/18/2013   Porokeratosis 12/18/2013   Pain in lower limb 11/21/2013   Ingrown nail  07/20/2013   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 07/02/2013   Obesity (BMI 30-39.9) 06/25/2013   GERD (gastroesophageal reflux disease) 08/20/2011   Lapband APL + HH repair June 2011-Major revision Dec 2013 06/29/2011   ABDOMINAL PAIN, ACUTE 08/01/2010   VITAMIN B12 DEFICIENCY 04/01/2010   BARIATRIC SURGERY STATUS 01/29/2010   ONYCHOMYCOSIS, BILATERAL 06/30/2009   STRESS INCONTINENCE 06/10/2009   ACUTE PHARYNGITIS 04/29/2009   COUGH 04/27/2009   BRONCHITIS, ACUTE 04/16/2009   NAUSEA 04/08/2009   DIARRHEA 04/08/2009   MORBID OBESITY 03/03/2009   SKIN RASH 11/18/2008   UNSPECIFIED VITAMIN D DEFICIENCY 07/15/2008   OTHER SPECIFIED ANEMIAS 07/15/2008   Pain in joint, multiple sites 06/13/2008   Myalgia and myositis, unspecified 06/13/2008   Myalgia 06/13/2008   BACK PAIN, LUMBAR 02/14/2008   ACUTE SINUSITIS, UNSPECIFIED 09/01/2007   CELLULITIS 08/12/2007   Cellulitis 08/12/2007   Depression with anxiety 06/09/2007   DEPRESSION 06/09/2007   Controlled type 2 diabetes mellitus with diabetic neuropathy, without long-term current use of insulin (HCC) 01/04/2007   Labile hypertension 01/04/2007   ALLERGIC RHINITIS 01/04/2007   HEADACHE 01/04/2007   Past Medical History:  Diagnosis Date   Allergic rhinitis    Anxiety    Asthma    hx bronchial asthma at times with upper resp infection   Depression    Fibromyalgia    GERD (gastroesophageal reflux disease)    Headache(784.0)    migraine and cluster headaches   Hyperlipemia    Hypertension    IBS (irritable bowel syndrome)    Menopause    Neuromuscular disorder (HCC) 2009   hx of fibromyalgia, polyarthralsia (surgery induced)   Osteoarthritis    Rheumatoid arthritis (HCC)    Complicated by osteoarthritis as well.   Rheumatoid arthritis (HCC)    Stress incontinence    at times   Tibial fracture 10/14/2016   evulsion periostial right   Type 2 diabetes mellitus with complication, without long-term  current use of insulin (HCC)    diet controlled no meds -> however was diagnosed with a diabetic foot ulcer.   Past Surgical History:  Procedure Laterality Date   ABDOMINAL HYSTERECTOMY  12/1999   complete   CARPAL TUNNEL RELEASE Right 12/27/2017   Procedure: CARPAL TUNNEL RELEASE;  Surgeon: Dominica Severin, MD;  Location: [facility];  Service: Orthopedics;  Laterality: Right;   CERVICAL CONIZATION W/BX  june 1990   dysplasia of cervix/used 5Fu cream for 3 months   CERVICAL LAMINECTOMY  2005 & 2009   X 2   NO ROM PROBLEMS   CESAREAN SECTION  1989 & 1993   X 2   CHOLECYSTECTOMY  1986   DILATION AND CURETTAGE OF UTERUS   [phone]   missed abortion   ESOPHAGOGASTRODUODENOSCOPY  06/29/2011   Procedure: ESOPHAGOGASTRODUODENOSCOPY (EGD);  Surgeon: Kandis Cocking, MD;  Location: [facility];  Service: General;  Laterality: N/A;   FOOT SURGERY  2005   RT HEEL   GASTRIC BANDING PORT REVISION  09/24/2011   Procedure: GASTRIC BANDING PORT REVISION;  Surgeon: Valarie Merino, MD;  Location: [facility];  Service: General;  Laterality: N/A;   GASTRIC ROUX-EN-Y N/A 06/06/2017   Procedure: 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  ENDOSCOPY;  Surgeon: Luretha Murphy, MD;  Location: [facility];  Service: General;  Laterality: N/A;   HYSTEROSCOPY  1999   LAPAROSCOPIC GASTRIC BANDING  12/30/09   LAPAROSCOPIC GASTRIC SLEEVE RESECTION N/A 07/02/2013   Procedure: LAPAROSCOPIC GASTRIC SLEEVE RESECTION upper endoscopy;  Surgeon: Valarie Merino, MD;  Location: [facility];  Service: General;  Laterality: N/A;   LAPAROSCOPIC REPAIR AND REMOVAL OF GASTRIC BAND N/A 07/02/2013   Procedure: LAPAROSCOPIC REMOVAL OF GASTRIC BAND;  Surgeon: Valarie Merino, MD;  Location: [facility];  Service: General;  Laterality: N/A;   LAPAROSCOPIC REVISION OF GASTRIC BAND  07/03/2012   Procedure: LAPAROSCOPIC REVISION OF GASTRIC BAND;  Surgeon: Valarie Merino, MD;  Location: [facility];  Service: General;   Laterality: N/A;  removal of old lap. band port, replaced with AP standard band   LAPAROSCOPY  09/24/2011   Procedure: LAPAROSCOPY DIAGNOSTIC;  Surgeon: Valarie Merino, MD;  Location: [facility];  Service: General;  Laterality: N/A;   LAPAROSCOPY  07/03/2012   Procedure: LAPAROSCOPY DIAGNOSTIC;  Surgeon: Valarie Merino, MD;  Location: [facility];  Service: General;  Laterality: N/A;  Exploratory Laparoscopy    MESH APPLIED TO LAP PORT  07/03/2012   Procedure: MESH APPLIED TO LAP PORT;  Surgeon: Valarie Merino, MD;  Location: [facility];  Service: General;  Laterality: N/A;   NASAL SINUS SURGERY  1995 & 1997   X 2   right knee  1981   ARTHROSCOPY AND ARTHROTOMY   right knee arthroscopy and arthrotomy  12-1979   TONSILLECTOMY  1971   TUBAL LIGATION  1993   WITH C -SECTION   Social History   Tobacco Use   Smoking status: Never   Smokeless tobacco: Never  Vaping Use   Vaping Use: Never used  Substance Use Topics   Alcohol use: Not Currently   Drug use: Never   Social History   Socioeconomic History   Marital status: Married    Spouse name: Sarada Devanney   Number of children: 2   Years of education: Not on file   Highest education level: Professional school degree (e.g., MD, DDS, DVM, JD)  Occupational History   Occupation: Teacher, adult education: [employer]  Tobacco Use   Smoking status: Never   Smokeless tobacco: Never  Vaping Use   Vaping Use: Never used  Substance and Sexual Activity   Alcohol use: Not Currently   Drug use: Never   Sexual activity: Yes    Birth control/protection: None  Other Topics Concern   Not on file  Social History Narrative   Right handed   Two story home   Drinks caffeine occasionally   Social Determinants of Health   Financial Resource Strain: Low Risk  (10/19/2022)   Overall Financial Resource Strain (CARDIA)    Difficulty of Paying Living Expenses: Not hard at all  Food Insecurity: No Food Insecurity (10/19/2022)   Hunger Vital Sign     Worried About Running Out of Food in the Last Year: Never true    Ran Out of Food in the Last Year: Never true  Transportation Needs: No Transportation Needs (10/19/2022)   PRAPARE - Administrator, Civil Service (Medical): No    Lack of Transportation (Non-Medical): No  Physical Activity: Insufficiently Active (10/19/2022)   Exercise Vital Sign    Days of Exercise per Week: 2 days    Minutes of Exercise per Session: 20 min  Stress: No Stress Concern Present (  10/19/2022)   [place] [employer] - Occupational Stress Questionnaire    Feeling of Stress : Not at all  Social Connections: Moderately Isolated (10/19/2022)   Social Connection and Isolation Panel [NHANES]    Frequency of Communication with Friends and Family: More than three times a week    Frequency of Social Gatherings with Friends and Family: Once a week    Attends Religious Services: Never    Database administrator or Organizations: No    Attends Engineer, structural: Not on file    Marital Status: Married  Catering manager Violence: Not on file   Family Status  Relation Name Status   Mother  Deceased   Father  Deceased at age 42   Other  (Not Specified)   Other  (Not Specified)   Family History  Problem Relation Age of Onset   Diabetes Mother    Stroke Mother    Breast cancer Mother    Atrial fibrillation Mother    Hypertension Mother    Cancer Mother 45       breast   Diabetes Father    Stroke Father 21       2nd 6 month apart   Hypertension Father    Heart attack Father 79       stents   Dementia Father    AAA (abdominal aortic aneurysm) Father 91       repair   CAD Father 60   Thyroid disease Other    Heart disease Other    COPD Other    Allergies  Allergen Reactions   Gabapentin Swelling    Pt states she has eye swelling    Isoniazid     Severe SOB   Morphine Hives and Nausea And Vomiting    Allergic to PCA pump only    Nitrofurantoin Shortness Of  Breath, Rash and Other (See Comments)    REACTION: welts   Tamiflu [Oseltamivir Phosphate] Nausea And Vomiting    "I vomited within 30 minutes of taking it and was told I cannot ever take it again."   Hctz [Hydrochlorothiazide] Rash   Macrolides And Ketolides Rash    rash, rash   Promethazine Hcl Other (See Comments)    IF GIVEN  IV-hallucinations     CAN TAKE PO PHENERGAN Other reaction(s): Unknown   Roxicet [Oxycodone-Acetaminophen] Itching   Wound Dressing Adhesive Rash    Adhesive Tape    Fentanyl Hives and Rash    REACTION: welts   Sulfamethoxazole-Trimethoprim Rash      Review of Systems  Constitutional:  Negative for fever and malaise/fatigue.  HENT:  Positive for ear pain. Negative for congestion.   Eyes:  Negative for blurred vision.  Respiratory:  Negative for cough and shortness of breath.   Cardiovascular:  Negative for chest pain, palpitations and leg swelling.  Gastrointestinal:  Negative for vomiting.  Musculoskeletal:  Positive for joint pain. Negative for back pain.  Skin:  Negative for rash.  Neurological:  Negative for loss of consciousness and headaches.      Objective:     BP 120/60 (BP Location: Left Arm, Patient Position: Sitting, Cuff Size: Normal)   Pulse 72   Temp 98.5 F (36.9 C) (Oral)   Resp 18   Ht 5\' 11"  (1.803 m)   Wt 191 lb (86.6 kg)   SpO2 97%   BMI 26.64 kg/m  BP Readings from Last 3 Encounters:  01/14/23 120/60  12/23/22 (!) 148/75  12/15/22 118/66  Wt Readings from Last 3 Encounters:  01/14/23 191 lb (86.6 kg)  10/22/22 201 lb 9.6 oz (91.4 kg)  08/06/22 200 lb 1.9 oz (90.8 kg)   SpO2 Readings from Last 3 Encounters:  01/14/23 97%  12/23/22 98%  10/22/22 97%      Physical Exam Vitals and nursing note reviewed.  Constitutional:      General: She is not in acute distress.    Appearance: Normal appearance. She is well-developed.  HENT:     Head: Normocephalic and atraumatic.     Right Ear: Tympanic membrane, ear  canal and external ear normal. There is no impacted cerumen.     Left Ear: Tympanic membrane, ear canal and external ear normal. There is no impacted cerumen.  Eyes:     General: No scleral icterus.       Right eye: No discharge.        Left eye: No discharge.  Cardiovascular:     Rate and Rhythm: Normal rate and regular rhythm.     Heart sounds: No murmur heard. Pulmonary:     Effort: Pulmonary effort is normal. No respiratory distress.     Breath sounds: Normal breath sounds.  Musculoskeletal:        General: Normal range of motion.     Cervical back: Normal range of motion and neck supple.     Right lower leg: No edema.     Left lower leg: No edema.  Skin:    General: Skin is warm and dry.  Neurological:     Mental Status: She is alert and oriented to person, place, and time.  Psychiatric:        Mood and Affect: Mood normal.        Behavior: Behavior normal.        Thought Content: Thought content normal.        Judgment: Judgment normal.      No results found for any visits on 01/14/23.  Last CBC Lab Results  Component Value Date   WBC 5.4 12/10/2022   HGB 13.0 12/10/2022   HCT 40 12/10/2022   MCV 90.2 10/22/2022   MCH 30.0 08/06/2022   RDW 13.2 10/22/2022   PLT 130 (A) 12/10/2022   Last metabolic panel Lab Results  Component Value Date   GLUCOSE 81 10/22/2022   NA 141 12/10/2022   K 4.1 12/10/2022   CL 101 12/10/2022   CO2 24 (A) 12/10/2022   BUN 9 12/10/2022   CREATININE 0.9 12/10/2022   EGFR 73 12/10/2022   CALCIUM 9.4 12/10/2022   PROT 7.0 10/22/2022   ALBUMIN 4.4 12/10/2022   BILITOT 0.7 10/22/2022   ALKPHOS 72 12/10/2022   AST 35 12/10/2022   ALT 39 (A) 12/10/2022   ANIONGAP 7 06/09/2021   Last lipids Lab Results  Component Value Date   CHOL 127 10/22/2022   HDL 69.10 10/22/2022   LDLCALC 44 10/22/2022   TRIG 66.0 10/22/2022   CHOLHDL 2 10/22/2022   Last hemoglobin A1c Lab Results  Component Value Date   HGBA1C 5.5 10/22/2022    Last thyroid functions Lab Results  Component Value Date   TSH 2.02 10/22/2022   Last vitamin D Lab Results  Component Value Date   VD25OH 39.63 10/22/2022   Last vitamin B12 and Folate Lab Results  Component Value Date   VITAMINB12 493 10/22/2022   FOLATE 18.1 05/11/2021      The ASCVD Risk score (Arnett DK, et al., 2019) failed to calculate  for the following reasons:   The valid total cholesterol range is 130 to 320 mg/dL    Assessment & Plan:   Problem List Items Addressed This Visit       Unprioritized   Tinea corporis   Relevant Medications   nystatin (MYCOSTATIN/NYSTOP) powder   Other Visit Diagnoses     Otalgia, unspecified laterality    -  Primary   Relevant Medications   ofloxacin (FLOXIN) 0.3 % OTIC solution   Essential hypertension       Relevant Medications   furosemide (LASIX) 20 MG tablet   Other insomnia       Relevant Medications   temazepam (RESTORIL) 30 MG capsule     Assessment and Plan    Medication Refills: Patient requires refills for Furosemide, Nystatin, and Temazepam. -Refill Furosemide, Nystatin, and Temazepam as previously prescribed.  Suspected Hernia: Patient reports a suspected hernia above the belly button, previously examined by Dr. [name]. Patient reports no pain but is considering surgical repair. -Refer to Dr. Norwood Levo for further evaluation and potential surgical repair.  Intermittent Ear Pain: Patient reports intermittent ear pain in one ear, possibly related to water exposure. No associated throat pain. Ear examination was normal. -Provide ear drops for patient to use as needed. -Advise use of antihistamine or Flonase if symptoms persist.  Thumb Joint Pain: Patient reports pain in thumb joint, described as a trigger finger. -Scheduled appointment with Dr. Amanda Pea for further evaluation and treatment.  Abnormal Bowel Movements: Patient reports abnormal bowel movements, described as small balls. No abdominal pain  reported. -Consider further evaluation if symptoms persist or worsen.  General Health Maintenance: -Continue monitoring liver function, as recent labs showed slight elevation. -Plan colonoscopy in five years as per previous schedule. -Check labs in October as scheduled. - otalgia----? from swimming, exam normal ---floxin gtts prn        Return in about 6 months (around 07/17/2023), or if symptoms worsen or fail to improve.    Donato Schultz, DO

## 2023-01-18 ENCOUNTER — Other Ambulatory Visit (HOSPITAL_BASED_OUTPATIENT_CLINIC_OR_DEPARTMENT_OTHER): Payer: Self-pay

## 2023-01-20 ENCOUNTER — Other Ambulatory Visit (HOSPITAL_BASED_OUTPATIENT_CLINIC_OR_DEPARTMENT_OTHER): Payer: Self-pay

## 2023-01-21 ENCOUNTER — Other Ambulatory Visit (HOSPITAL_BASED_OUTPATIENT_CLINIC_OR_DEPARTMENT_OTHER): Payer: Self-pay

## 2023-01-24 ENCOUNTER — Ambulatory Visit: Payer: Commercial Managed Care - PPO | Admitting: Orthopaedic Surgery

## 2023-01-24 ENCOUNTER — Other Ambulatory Visit (INDEPENDENT_AMBULATORY_CARE_PROVIDER_SITE_OTHER): Payer: Commercial Managed Care - PPO

## 2023-01-24 ENCOUNTER — Encounter: Payer: Self-pay | Admitting: Orthopaedic Surgery

## 2023-01-24 VITALS — Ht 69.5 in | Wt 189.8 lb

## 2023-01-24 DIAGNOSIS — M25561 Pain in right knee: Secondary | ICD-10-CM

## 2023-01-24 DIAGNOSIS — M1711 Unilateral primary osteoarthritis, right knee: Secondary | ICD-10-CM | POA: Insufficient documentation

## 2023-01-24 DIAGNOSIS — G8929 Other chronic pain: Secondary | ICD-10-CM

## 2023-01-24 MED ORDER — METHYLPREDNISOLONE ACETATE 40 MG/ML IJ SUSP
40.0000 mg | INTRAMUSCULAR | Status: AC | PRN
Start: 2023-01-24 — End: 2023-01-24
  Administered 2023-01-24: 40 mg via INTRA_ARTICULAR

## 2023-01-24 MED ORDER — LIDOCAINE HCL 1 % IJ SOLN
3.0000 mL | INTRAMUSCULAR | Status: AC | PRN
Start: 2023-01-24 — End: 2023-01-24
  Administered 2023-01-24: 3 mL

## 2023-01-24 NOTE — Progress Notes (Signed)
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  She is very active but does have rheumatoid arthritis.  She has been dealing with right knee pain that has gotten worse since last November when she slipped on a wet floor and the knee bent back quite a bit.  She has had previous multiple injections of steroids in her knee on the right side as well as hyaluronic acid injections.  Its gotten to where her pain is daily and it is significant.  It is detrimentally affecting her mobility, her quality life and her activities of daily living.  She does have chronic pain syndrome and fibromyalgia.  I was able to review all of her medications within epic as well as past medical history and other notes as it relates to her health.  She is incredibly active and wants to stay active but the knee is what slowing her down the motion terms of her right knee.  Examination of the right knee does show varus malalignment that is correctable.  There is significant medial and lateral joint line tenderness and patellofemoral crepitation.  She has good range of motion the knee and it is stable ligamentously.  X-rays of the right knee show severe end-stage arthritis.  There is osteophytes in all 3 compartment and significant medial joint space narrowing.  There is also significant patellofemoral narrowing and calcifications around the lateral meniscus and medial meniscus.  At this point we talked in length in detail about knee replacement surgery.  She is interested in having this done.  However she would like to at least try 1 more steroid injection today.  I agreed to this and she tolerated the injection well.  She has her surgery scheduler's card.  All questions and concerns were addressed and answered.  If she gets to the point where she wished to proceed with knee replacement surgery she will give [place] a call.    Procedure Note  Patient: [name]             Date of Birth:  11/11/1955           MRN: [id number]             Visit Date: 01/24/2023  Procedures: Visit Diagnoses:  1. Chronic pain of right knee     Large Joint Inj: R knee on 01/24/2023 4:00 PM Indications: diagnostic evaluation and pain Details: 22 G 1.5 in needle, superolateral approach  Arthrogram: No  Medications: 3 mL lidocaine 1 %; 40 mg methylPREDNISolone acetate 40 MG/ML Outcome: tolerated well, no immediate complications Procedure, treatment alternatives, risks and benefits explained, specific risks discussed. Consent was given by the patient. Immediately prior to procedure a time out was called to verify the correct patient, procedure, equipment, support staff and site/side marked as required. Patient was prepped and draped in the usual sterile fashion.

## 2023-01-26 ENCOUNTER — Other Ambulatory Visit (HOSPITAL_BASED_OUTPATIENT_CLINIC_OR_DEPARTMENT_OTHER): Payer: Self-pay

## 2023-01-26 ENCOUNTER — Other Ambulatory Visit: Payer: Self-pay

## 2023-01-27 DIAGNOSIS — M79643 Pain in unspecified hand: Secondary | ICD-10-CM | POA: Diagnosis not present

## 2023-01-27 DIAGNOSIS — M797 Fibromyalgia: Secondary | ICD-10-CM | POA: Diagnosis not present

## 2023-01-27 DIAGNOSIS — M0609 Rheumatoid arthritis without rheumatoid factor, multiple sites: Secondary | ICD-10-CM | POA: Diagnosis not present

## 2023-01-27 DIAGNOSIS — M1990 Unspecified osteoarthritis, unspecified site: Secondary | ICD-10-CM | POA: Diagnosis not present

## 2023-01-27 DIAGNOSIS — M549 Dorsalgia, unspecified: Secondary | ICD-10-CM | POA: Diagnosis not present

## 2023-01-27 DIAGNOSIS — M2550 Pain in unspecified joint: Secondary | ICD-10-CM | POA: Diagnosis not present

## 2023-01-27 DIAGNOSIS — M8580 Other specified disorders of bone density and structure, unspecified site: Secondary | ICD-10-CM | POA: Diagnosis not present

## 2023-01-27 DIAGNOSIS — Z79899 Other long term (current) drug therapy: Secondary | ICD-10-CM | POA: Diagnosis not present

## 2023-01-27 DIAGNOSIS — R748 Abnormal levels of other serum enzymes: Secondary | ICD-10-CM | POA: Diagnosis not present

## 2023-01-27 DIAGNOSIS — G8929 Other chronic pain: Secondary | ICD-10-CM | POA: Diagnosis not present

## 2023-01-28 ENCOUNTER — Other Ambulatory Visit (HOSPITAL_BASED_OUTPATIENT_CLINIC_OR_DEPARTMENT_OTHER): Payer: Self-pay

## 2023-01-28 DIAGNOSIS — M793 Panniculitis, unspecified: Secondary | ICD-10-CM | POA: Diagnosis not present

## 2023-01-28 DIAGNOSIS — Z6832 Body mass index (BMI) 32.0-32.9, adult: Secondary | ICD-10-CM | POA: Diagnosis not present

## 2023-01-28 DIAGNOSIS — E661 Drug-induced obesity: Secondary | ICD-10-CM | POA: Diagnosis not present

## 2023-01-28 DIAGNOSIS — Z9884 Bariatric surgery status: Secondary | ICD-10-CM | POA: Diagnosis not present

## 2023-01-28 DIAGNOSIS — K439 Ventral hernia without obstruction or gangrene: Secondary | ICD-10-CM | POA: Diagnosis not present

## 2023-02-01 ENCOUNTER — Other Ambulatory Visit: Payer: Self-pay | Admitting: General Surgery

## 2023-02-01 DIAGNOSIS — K439 Ventral hernia without obstruction or gangrene: Secondary | ICD-10-CM

## 2023-02-01 DIAGNOSIS — Z9884 Bariatric surgery status: Secondary | ICD-10-CM

## 2023-02-01 DIAGNOSIS — E661 Drug-induced obesity: Secondary | ICD-10-CM

## 2023-02-04 ENCOUNTER — Encounter: Payer: Self-pay | Admitting: Hematology & Oncology

## 2023-02-04 ENCOUNTER — Inpatient Hospital Stay: Payer: Commercial Managed Care - PPO | Admitting: Hematology & Oncology

## 2023-02-04 ENCOUNTER — Other Ambulatory Visit: Payer: Self-pay

## 2023-02-04 ENCOUNTER — Inpatient Hospital Stay: Payer: Commercial Managed Care - PPO | Attending: Hematology & Oncology

## 2023-02-04 VITALS — BP 126/60 | HR 70 | Temp 98.5°F | Resp 16 | Ht 69.5 in | Wt 191.0 lb

## 2023-02-04 DIAGNOSIS — M069 Rheumatoid arthritis, unspecified: Secondary | ICD-10-CM | POA: Diagnosis not present

## 2023-02-04 DIAGNOSIS — E119 Type 2 diabetes mellitus without complications: Secondary | ICD-10-CM | POA: Insufficient documentation

## 2023-02-04 DIAGNOSIS — M1990 Unspecified osteoarthritis, unspecified site: Secondary | ICD-10-CM | POA: Insufficient documentation

## 2023-02-04 DIAGNOSIS — D508 Other iron deficiency anemias: Secondary | ICD-10-CM | POA: Diagnosis not present

## 2023-02-04 DIAGNOSIS — D509 Iron deficiency anemia, unspecified: Secondary | ICD-10-CM

## 2023-02-04 DIAGNOSIS — M0579 Rheumatoid arthritis with rheumatoid factor of multiple sites without organ or systems involvement: Secondary | ICD-10-CM | POA: Diagnosis not present

## 2023-02-04 DIAGNOSIS — Z9884 Bariatric surgery status: Secondary | ICD-10-CM

## 2023-02-04 DIAGNOSIS — E1140 Type 2 diabetes mellitus with diabetic neuropathy, unspecified: Secondary | ICD-10-CM

## 2023-02-04 DIAGNOSIS — K912 Postsurgical malabsorption, not elsewhere classified: Secondary | ICD-10-CM | POA: Insufficient documentation

## 2023-02-04 LAB — CBC WITH DIFFERENTIAL (CANCER CENTER ONLY)
Abs Immature Granulocytes: 0.05 10*3/uL (ref 0.00–0.07)
Basophils Absolute: 0.1 10*3/uL (ref 0.0–0.1)
Basophils Relative: 1 %
Eosinophils Absolute: 0.1 10*3/uL (ref 0.0–0.5)
Eosinophils Relative: 2 %
HCT: 39.7 % (ref 36.0–46.0)
Hemoglobin: 12.7 g/dL (ref 12.0–15.0)
Immature Granulocytes: 1 %
Lymphocytes Relative: 26 %
Lymphs Abs: 1.4 10*3/uL (ref 0.7–4.0)
MCH: 30.3 pg (ref 26.0–34.0)
MCHC: 32 g/dL (ref 30.0–36.0)
MCV: 94.7 fL (ref 80.0–100.0)
Monocytes Absolute: 0.7 10*3/uL (ref 0.1–1.0)
Monocytes Relative: 12 %
Neutro Abs: 3.2 10*3/uL (ref 1.7–7.7)
Neutrophils Relative %: 58 %
Platelet Count: 141 10*3/uL — ABNORMAL LOW (ref 150–400)
RBC: 4.19 MIL/uL (ref 3.87–5.11)
RDW: 12.7 % (ref 11.5–15.5)
WBC Count: 5.5 10*3/uL (ref 4.0–10.5)
nRBC: 0 % (ref 0.0–0.2)

## 2023-02-04 LAB — IRON AND IRON BINDING CAPACITY (CC-WL,HP ONLY)
Iron: 99 ug/dL (ref 28–170)
Saturation Ratios: 31 % (ref 10.4–31.8)
TIBC: 323 ug/dL (ref 250–450)
UIBC: 224 ug/dL (ref 148–442)

## 2023-02-04 LAB — RETICULOCYTES
Immature Retic Fract: 10.7 % (ref 2.3–15.9)
RBC.: 4.19 MIL/uL (ref 3.87–5.11)
Retic Count, Absolute: 68.3 10*3/uL (ref 19.0–186.0)
Retic Ct Pct: 1.6 % (ref 0.4–3.1)

## 2023-02-04 LAB — FERRITIN: Ferritin: 91 ng/mL (ref 11–307)

## 2023-02-04 NOTE — Progress Notes (Signed)
Hematology and Oncology Follow Up Visit  [name] [id number] Apr 13, 1956 67 y.o. 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  Unfortunately, she does have quite a few issues today.  Thankfully, nothing seems to be related to her having iron deficiency.  A lot of this has to do with her arthritis.  She has osteoarthritis and rheumatoid arthritis.  She is on medications for this.  She is having a lot of pain issues because of her arthritis.  She does see a rheumatologist.  I think she also sees a pain doctor.  She is on Plaquenil.  I think she also gets some infusions.  She does have diabetes.  She is on [employer]  She is on Lyrica.  I just feel that she has all of these other issues that really are affecting her quality of life.  When we last saw her back in January, her iron studies showed a ferritin of 175 with an iron saturation of 40%.  Today, her ferritin is 91 with an iron saturation of 31%.  Overall, I would have to say that her performance status is probably ECOG 1-2.   Medications:  Allergies as of 02/04/2023       Reactions   Gabapentin Swelling   Pt states she has eye swelling    Isoniazid    Severe SOB   Morphine Hives, Nausea And Vomiting   Allergic to PCA pump only   Nitrofurantoin Shortness Of Breath, Rash, Other (See Comments)   REACTION: welts   Tamiflu [oseltamivir Phosphate] Nausea And Vomiting   "I vomited within 30 minutes of taking it and was told I cannot ever take it again."   Hctz [hydrochlorothiazide] Rash   Macrolides And Ketolides Rash   rash, rash   Promethazine Hcl Other (See Comments)   IF GIVEN  IV-hallucinations     CAN TAKE PO PHENERGAN Other reaction(s): Unknown   Roxicet [oxycodone-acetaminophen] Itching   Wound Dressing Adhesive Rash   Adhesive Tape     Fentanyl Hives, Rash   REACTION: welts   Sulfamethoxazole-trimethoprim Rash        Medication List        Accurate as of February 04, 2023  8:52 AM. If you have any questions, ask your nurse or doctor.          acetaminophen 650 MG CR tablet Commonly known as: TYLENOL Take by mouth.   Actemra 80 MG/4ML Soln injection Generic drug: Tocilizumab Inject into the vein.   albuterol 108 (90 Base) MCG/ACT inhaler Commonly known as: VENTOLIN HFA   calcitonin (salmon) 200 UNIT/ACT nasal spray Commonly known as: MIACALCIN/FORTICAL Place 1 spray into alternate nostrils daily.   Centrum Adults Tabs take 2 tablets by oral route  every day with food   Cholecalciferol 125 MCG (5000 UT) Tabs take 1 tablet by oral route every day   Cyanocobalamin 3000 MCG/ML Liqd as directed Sublingual   cyclobenzaprine 10 MG tablet Commonly known as: FLEXERIL Take 1 tablet (10 mg total) by mouth before bedtime for muscle spasms.   cyclobenzaprine 10 MG tablet Commonly known as: FLEXERIL Take 1 tablet (10 mg total) by mouth 3 (three) times daily as needed for muscle spasms.   cyclobenzaprine 10 MG tablet Commonly known as: FLEXERIL Take 1 tablet (10 mg total)  by mouth every 8 (eight) hours as needed.   DerMend Bruise Formula Crea as directed Externally   diclofenac Sodium 1 % Gel Commonly known as: VOLTAREN Apply 4 g topically 4 (four) times daily.   Dotti 0.1 MG/24HR patch Generic drug: estradiol Apply 1 patch twice a week by transdermal route.   DULoxetine 30 MG capsule Commonly known as: CYMBALTA Take 1 capsule (30 mg total) by mouth 2 (two) times daily.   Emgality 120 MG/ML Soaj Generic drug: Galcanezumab-gnlm INJECT 120 MG INTO THE SKIN EVERY 28 (TWENTY-EIGHT) DAYS.   Flovent HFA 110 MCG/ACT inhaler Generic drug: fluticasone   furosemide 20 MG tablet Commonly known as: LASIX Take 1 tablet (20 mg total) by mouth every morning.   glucose blood test strip Test blood  sugar once daily. Dx Code: E11.9   hydrALAZINE 25 MG tablet Commonly known as: APRESOLINE Take 1 tablet (25 mg total) by mouth 2 (two) times daily. Hold if feeling dizzy or SBP<110 mmHg).  Can use additional 25 mg as needed for SBP>150 mmHg   hydroxychloroquine 200 MG tablet Commonly known as: PLAQUENIL Take 2 tablets (400 mg total) by mouth daily.   Jublia 10 % Soln Generic drug: Efinaconazole APPLY TO AFFECTED TOENAILS ONCE DAILY FOR 48 WEEKS   methocarbamol 500 MG tablet Commonly known as: ROBAXIN Take 1 tablet (500 mg total) by mouth 2 (two) times daily as needed.   Monoferric 1000 MG/10ML Soln injection Generic drug: ferric derisomaltose as directed Intravenous   mupirocin ointment 2 % Commonly known as: BACTROBAN Apply to toes as needed   nystatin powder Commonly known as: MYCOSTATIN/NYSTOP Apply 1 Application topically 3 (three) times daily.   ofloxacin 0.3 % OTIC solution Commonly known as: FLOXIN Place 10 drops into both ears daily.   olmesartan 20 MG tablet Commonly known as: BENICAR Take 1 tablet (20 mg total) by mouth daily with supper.   Oscimin 0.125 MG SL tablet Generic drug: hyoscyamine Place 1 tablet under the tongue every 6 (six) hours as needed.   Ozempic (2 MG/DOSE) 8 MG/3ML Sopn Generic drug: Semaglutide (2 MG/DOSE) Inject 2 mg as directed once a week.   pantoprazole 40 MG tablet Commonly known as: PROTONIX Take 1 tablet (40 mg total) by mouth daily.   potassium chloride 10 MEQ tablet Commonly known as: KLOR-CON M Take 1 tablet (10 mEq total) by mouth daily.   pregabalin 150 MG capsule Commonly known as: LYRICA Take 1 capsule (150 mg total) by mouth 3 (three) times daily. Max Daily Amount: 450 mg.   Premarin vaginal cream Generic drug: conjugated estrogens Place 0.5 Applicatorfuls vaginally 3 (three) times a week.   rosuvastatin 20 MG tablet Commonly known as: CRESTOR Take 1 tablet (20 mg total) by mouth daily.   temazepam 30 MG  capsule Commonly known as: RESTORIL Take 1 capsule (30 mg total) by mouth at bedtime as needed for sleep.   thiamine 100 MG tablet Commonly known as: Vitamin B-1 Take 100 mg daily by mouth.   triamcinolone ointment 0.1 % Commonly known as: KENALOG Apply 1 Application (a thin layer) topically in the morning and at bedtime.   Ubrelvy 100 MG Tabs Generic drug: Ubrogepant TAKE ONE TABLET BY MOUTH DAILY AS NEEDED FOR MIGRAINE. MAX 2 TABLETS IN 24 HOURS        Allergies:  Allergies  Allergen Reactions   Gabapentin Swelling    Pt states she has eye swelling    Isoniazid     Severe SOB  Morphine Hives and Nausea And Vomiting    Allergic to PCA pump only    Nitrofurantoin Shortness Of Breath, Rash and Other (See Comments)    REACTION: welts   Tamiflu [Oseltamivir Phosphate] Nausea And Vomiting    "I vomited within 30 minutes of taking it and was told I cannot ever take it again."   Hctz [Hydrochlorothiazide] Rash   Macrolides And Ketolides Rash    rash, rash   Promethazine Hcl Other (See Comments)    IF GIVEN  IV-hallucinations     CAN TAKE PO PHENERGAN Other reaction(s): Unknown   Roxicet [Oxycodone-Acetaminophen] Itching   Wound Dressing Adhesive Rash    Adhesive Tape    Fentanyl Hives and Rash    REACTION: welts   Sulfamethoxazole-Trimethoprim Rash    Past Medical History, Surgical history, Social history, and Family History were reviewed and updated.  Review of Systems: Review of Systems  Constitutional:  Positive for malaise/fatigue.  HENT: Negative.    Eyes: Negative.   Musculoskeletal:  Positive for joint pain and myalgias.  Skin: Negative.      Physical Exam: Vital signs show temperature of 98.5.  Pulse 70.  Blood pressure 126/60.  Weight is 191 pounds.  Wt Readings from Last 3 Encounters:  01/24/23 189 lb 12.8 oz (86.1 kg)  01/14/23 191 lb (86.6 kg)  10/22/22 201 lb 9.6 oz (91.4 kg)    Ocular: Sclerae unicteric, pupils equal, round and reactive to  light Ear-nose-throat: Oropharynx clear, dentition fair Lymphatic: No cervical or supraclavicular adenopathy Lungs no rales or rhonchi, good excursion bilaterally Heart regular rate and rhythm, no murmur appreciated Abd soft, nontender, positive bowel sounds MSK no focal spinal tenderness, no joint edema Neuro: non-focal, well-oriented, appropriate affect Breasts: Deferred   Lab Results  Component Value Date   WBC 5.5 02/04/2023   HGB 12.7 02/04/2023   HCT 39.7 02/04/2023   MCV 94.7 02/04/2023   PLT 141 (L) 02/04/2023   Lab Results  Component Value Date   FERRITIN 175 08/06/2022   IRON 134 08/06/2022   TIBC 339 08/06/2022   UIBC 205 08/06/2022   IRONPCTSAT 40 (H) 08/06/2022   Lab Results  Component Value Date   RETICCTPCT 1.6 02/04/2023   RBC 4.19 02/04/2023   RBC 4.19 02/04/2023   No results found for: "KPAFRELGTCHN", "LAMBDASER", "KAPLAMBRATIO" No results found for: "IGGSERUM", "IGA", "IGMSERUM" No results found for: "TOTALPROTELP", "ALBUMINELP", "A1GS", "A2GS", "BETS", "BETA2SER", "GAMS", "MSPIKE", "SPEI"   Chemistry      Component Value Date/Time   NA 141 12/10/2022 0000   K 4.1 12/10/2022 0000   CL 101 12/10/2022 0000   CO2 24 (A) 12/10/2022 0000   BUN 9 12/10/2022 0000   CREATININE 0.9 12/10/2022 0000   CREATININE 0.94 10/22/2022 0940   CREATININE 0.86 05/11/2021 1435   GLU 88 12/10/2022 0000      Component Value Date/Time   CALCIUM 9.4 12/10/2022 0000   ALKPHOS 72 12/10/2022 0000   AST 35 12/10/2022 0000   AST 25 05/11/2021 1435   ALT 39 (A) 12/10/2022 0000   ALT 24 05/11/2021 1435   BILITOT 0.7 10/22/2022 0940   BILITOT 0.6 05/11/2021 1435       Impression and Plan: [name] is a very pleasant 67 yo caucasian female with anemia secondary to malabsorption s/p gastric bypass in 2018.   Today, her iron studies looked okay.  She does not need any IV iron.  I just feel bad that she is having some issues with her  arthritis.  Hopefully, this can be  sorted out.  We probably have to get her back to see [place] in about 2 or 3 months.  She has a lot going on.  I just want to make sure that we stay in close follow-up.   Amy Macho, [name] 7/26/20248:52 AM

## 2023-02-06 ENCOUNTER — Other Ambulatory Visit: Payer: Self-pay | Admitting: Family Medicine

## 2023-02-07 ENCOUNTER — Telehealth: Payer: Self-pay | Admitting: *Deleted

## 2023-02-07 ENCOUNTER — Other Ambulatory Visit (HOSPITAL_BASED_OUTPATIENT_CLINIC_OR_DEPARTMENT_OTHER): Payer: Self-pay

## 2023-02-07 DIAGNOSIS — M65331 Trigger finger, right middle finger: Secondary | ICD-10-CM | POA: Diagnosis not present

## 2023-02-07 DIAGNOSIS — M65312 Trigger thumb, left thumb: Secondary | ICD-10-CM | POA: Diagnosis not present

## 2023-02-07 MED ORDER — HYDROXYCHLOROQUINE SULFATE 200 MG PO TABS
400.0000 mg | ORAL_TABLET | Freq: Every day | ORAL | 1 refills | Status: DC
  Filled 2023-02-07: qty 180, 90d supply, fill #0
  Filled 2023-05-04: qty 180, 90d supply, fill #1

## 2023-02-07 NOTE — Telephone Encounter (Signed)
Called patient and lvm of upcoming appointments - requested call back to confirm. 

## 2023-02-08 ENCOUNTER — Other Ambulatory Visit (HOSPITAL_BASED_OUTPATIENT_CLINIC_OR_DEPARTMENT_OTHER): Payer: Self-pay

## 2023-02-09 DIAGNOSIS — M0609 Rheumatoid arthritis without rheumatoid factor, multiple sites: Secondary | ICD-10-CM | POA: Diagnosis not present

## 2023-02-23 ENCOUNTER — Ambulatory Visit
Admission: RE | Admit: 2023-02-23 | Discharge: 2023-02-23 | Disposition: A | Discharge status: Home / Self Care | Payer: Commercial Managed Care - PPO | Source: Ambulatory Visit | Attending: General Surgery | Admitting: General Surgery

## 2023-02-23 DIAGNOSIS — K219 Gastro-esophageal reflux disease without esophagitis: Secondary | ICD-10-CM | POA: Diagnosis not present

## 2023-02-23 DIAGNOSIS — K439 Ventral hernia without obstruction or gangrene: Secondary | ICD-10-CM | POA: Diagnosis not present

## 2023-02-23 DIAGNOSIS — Z6832 Body mass index (BMI) 32.0-32.9, adult: Secondary | ICD-10-CM | POA: Diagnosis not present

## 2023-02-23 DIAGNOSIS — I10 Essential (primary) hypertension: Secondary | ICD-10-CM | POA: Diagnosis not present

## 2023-02-23 DIAGNOSIS — Z9884 Bariatric surgery status: Secondary | ICD-10-CM

## 2023-02-23 DIAGNOSIS — E661 Drug-induced obesity: Secondary | ICD-10-CM

## 2023-02-23 MED ORDER — IOPAMIDOL (ISOVUE-300) INJECTION 61%
100.0000 mL | Freq: Once | INTRAVENOUS | Status: AC | PRN
  Administered 2023-02-23: 100 mL via INTRAVENOUS

## 2023-02-24 ENCOUNTER — Other Ambulatory Visit: Payer: Self-pay

## 2023-02-24 ENCOUNTER — Other Ambulatory Visit (HOSPITAL_BASED_OUTPATIENT_CLINIC_OR_DEPARTMENT_OTHER): Payer: Self-pay

## 2023-02-24 DIAGNOSIS — M4326 Fusion of spine, lumbar region: Secondary | ICD-10-CM | POA: Diagnosis not present

## 2023-02-24 MED ORDER — METHOCARBAMOL 750 MG PO TABS
750.0000 mg | ORAL_TABLET | Freq: Three times a day (TID) | ORAL | 0 refills | Status: DC
  Filled 2023-02-24: qty 180, 60d supply, fill #0

## 2023-02-24 MED ORDER — CYCLOBENZAPRINE HCL 10 MG PO TABS
10.0000 mg | ORAL_TABLET | Freq: Two times a day (BID) | ORAL | 0 refills | Status: DC | PRN
  Filled 2023-02-24 – 2023-05-12 (×2): qty 180, 90d supply, fill #0

## 2023-02-25 ENCOUNTER — Other Ambulatory Visit (HOSPITAL_BASED_OUTPATIENT_CLINIC_OR_DEPARTMENT_OTHER): Payer: Self-pay

## 2023-03-03 ENCOUNTER — Other Ambulatory Visit: Payer: Self-pay

## 2023-03-03 ENCOUNTER — Other Ambulatory Visit (HOSPITAL_BASED_OUTPATIENT_CLINIC_OR_DEPARTMENT_OTHER): Payer: Self-pay

## 2023-03-03 MED ORDER — PREGABALIN 150 MG PO CAPS
150.0000 mg | ORAL_CAPSULE | Freq: Three times a day (TID) | ORAL | 0 refills | Status: DC
  Filled 2023-03-03: qty 90, 30d supply, fill #0

## 2023-03-04 ENCOUNTER — Other Ambulatory Visit (HOSPITAL_BASED_OUTPATIENT_CLINIC_OR_DEPARTMENT_OTHER): Payer: Self-pay

## 2023-03-04 ENCOUNTER — Other Ambulatory Visit: Payer: Self-pay

## 2023-03-10 NOTE — Progress Notes (Signed)
NEUROLOGY FOLLOW UP OFFICE NOTE  [name] [id number]  Assessment/Plan:   Migraine with/without aura, without status migrainosus, not intractable. Dizziness with episode of syncope.  Secondary to vasovagal/orthostatic hypotension - followed by cardiology    Migraine prevention:  Emgality.  Now that her husband is doing better, hopefully stress and migraines will improve Migraine rescue:  Ubrelvy 100mg  Limit use of pain relievers to no more than 2 days out of week to prevent risk of rebound or medication-overuse headache. Keep headache diary Follow up 5 months.     Subjective:  [name] is a 67 year old female with fibromyalgia, RA, HTN, IBS, depression, type 2 diabetes and history of L4-S1 decompression who follows up for migraine and recurrent syncope   UPDATE: Reports increased migraines since January which she attributes to increased stress related to her husband's.  He had been having dysphagia.  He was worked up for chest pain.  Lost 15 lbs due to liquid diet.  Hospitalized for TIA.   He finally had his esophagus dilated recently and doing much better. Intensity:  moderate to severe Duration:  2 hours with Bernita Raisin Frequency:  average 5-6 a month   No recent episodes of syncope.  Current NSAIDS/analgesics:  Tylenol, tramadol Current triptans:  none Current ergotamine:  none Current anti-emetic:  none Current muscle relaxants:  Flexeril, Robaxin 500mg  Current Antihypertensive medications:  olmesartan, lasix Current Antidepressant medications:  Cymbalta 30mg  BID Current Anticonvulsant medications:  Lyrica Current anti-CGRP:  Emgality, Ubrelvy 100mg  Current Vitamins/Herbal/Supplements:  B1, KCl, MVI Current Antihistamines/Decongestants:  Meclizine, Claritin Other therapy:  none Hormone/birth control:  estradiol Other medications:  Plaquenil, Actemra, Restoril     HISTORY:  In 2021, she was tried on different immunosuppressant therapies for her RA.   In addition to her RA pain, she has fibromyalgia, osteoarthritis, lumbosacral spondylosis and sacroiliac pain, for which she is managed by pain medicine.  She began experiencing daily headaches in July 2021.  The following month, she began experiencing dizziness.  She was found to have a new murmur and was referred to cardiology.  Echocardiogram in August was normal with EF 60-65%.  Murmur was thought to be secondary to mild aortic valve sclerosis.  The dizziness occurs spontaneously and not positional.  She will experience severe spinning and lightheadedness lasting several minutes and occurring off and on during the day.  It may occur with the headaches but also by itself.  Headaches are described as moderate pressure in a cap distribution, sometimes associated with nausea, photophobia and phonophobia, sometimes flickers or spots in her vision.  She has a heavy foggy feeling in her head.  They last several hours, sometimes all day.  They may occur daily for a couple of weeks and then they may be episodic with one or two headache-free days in between.  On 07/09/2020, she had two syncopal spells that morning.  The first time, she walked into the kitchen and suddenly passed out.  She woke up and started walking out of the kitchen back to bed when she suddenly passed out again.  Witnessed by her daughter.  The events lasted about 30 seconds, no convulsions, tongue biting, palpitations or postictal confusion. However, she did have urinary incontinence.  Blood pressure was reportedly 92/52.  She is currently wearing a cardiac event monitor.  Carotid ultrasound on 07/30/2020 showed no hemodynamically significant ICA stenosis.  She went to the ED on 08/01/2020 for dizziness, left arm tingling and systolic BP 190s.  CT head personally  reviewed was negative for acute abnormalities.  Basic labs and troponin were negative.  EKG showed new RBBB but nothing acute.  She later had MRI and MRA of brain on 08/08/2020  showed mild  bilateral mastoid effusions but otherwise unremarkable.  Since then, blood pressure has been elevated.  EEG on 09/03/2020 was normal.  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     She has remote history of migraines in 1999-2000.  They were severe headaches with diaphoresis, nausea, photophobia and phonophobia.  She was treated with propranolol for a time.  No history of seizures.     Past NSAIDS/analgesics:  none Past abortive triptans:  none Past abortive ergotamine:  none Past muscle relaxants:  none Past anti-emetic:  Zofran, Reglan, Phenergan Past antihypertensive medications:  Lasix Past antidepressant medications:  Amitriptyline, nortriptyline, citalopram Past anticonvulsant medications:  Gabapentin, Lyrica Past anti-CGRP:  none Past vitamins/Herbal/Supplements:  none Past antihistamines/decongestants:  none Other past therapies:  none  PAST MEDICAL HISTORY: Past Medical History:  Diagnosis Date   Allergic rhinitis    Anxiety    Asthma    hx bronchial asthma at times with upper resp infection   Depression    Fibromyalgia    GERD (gastroesophageal reflux disease)    Headache(784.0)    migraine and cluster headaches   Hyperlipemia    Hypertension    IBS (irritable bowel syndrome)    Menopause    Neuromuscular disorder (HCC) 2009   hx of fibromyalgia, polyarthralsia (surgery induced)   Osteoarthritis    Rheumatoid arthritis (HCC)    Complicated by osteoarthritis as well.   Rheumatoid arthritis (HCC)    Stress incontinence    at times   Tibial fracture 10/14/2016   evulsion periostial right   Type 2 diabetes mellitus with complication, without long-term current use of insulin (HCC)    diet controlled no meds -> however was diagnosed with a diabetic foot ulcer.    MEDICATIONS: Current Outpatient Medications on File Prior to Visit  Medication Sig Dispense Refill   acetaminophen (TYLENOL) 650 MG CR tablet  Take by mouth.     Cholecalciferol 125 MCG (5000 UT) TABS take 1 tablet by oral route every day     conjugated estrogens (PREMARIN) vaginal cream Place 0.5 Applicatorfuls vaginally 3 (three) times a week. 30 g 5   Cyanocobalamin 3000 MCG/ML LIQD as directed Sublingual     cyclobenzaprine (FLEXERIL) 10 MG tablet Take 1 tablet (10 mg total) by mouth before bedtime for muscle spasms. 30 tablet 2   cyclobenzaprine (FLEXERIL) 10 MG tablet Take 1 tablet (10 mg total) by mouth 3 (three) times daily as needed for muscle spasms. 90 tablet 1   cyclobenzaprine (FLEXERIL) 10 MG tablet Take 1 tablet (10 mg total) by mouth every 12 hours as needed 180 tablet 0   diclofenac Sodium (VOLTAREN) 1 % GEL Apply 4 g topically 4 (four) times daily. 400 g 6   DULoxetine (CYMBALTA) 30 MG capsule Take 1 capsule (30 mg total) by mouth 2 (two) times daily. 180 capsule 1   Efinaconazole (JUBLIA) 10 % SOLN APPLY TO AFFECTED TOENAILS ONCE DAILY FOR 48 WEEKS 8 mL 11   Emollient (DERMEND BRUISE FORMULA) CREA as directed Externally     estradiol (DOTTI) 0.1 MG/24HR patch Apply 1 patch twice a week by transdermal route. 24 patch 4   ferric derisomaltose (MONOFERRIC) 1000 MG/10ML SOLN injection as directed Intravenous     furosemide (LASIX)  20 MG tablet Take 1 tablet (20 mg total) by mouth every morning. 90 tablet 3   Galcanezumab-gnlm (EMGALITY) 120 MG/ML SOAJ INJECT 120 MG INTO THE SKIN EVERY 28 (TWENTY-EIGHT) DAYS. 1 mL 11   glucose blood test strip Test blood sugar once daily. Dx Code: E11.9 100 each 12   hydrALAZINE (APRESOLINE) 25 MG tablet Take 1 tablet (25 mg total) by mouth 2 (two) times daily. Hold if feeling dizzy or SBP<110 mmHg).  Can use additional 25 mg as needed for SBP>150 mmHg 180 tablet 3   hydroxychloroquine (PLAQUENIL) 200 MG tablet Take 2 tablets (400 mg total) by mouth daily. 180 tablet 1   Hyoscyamine Sulfate SL (LEVSIN/SL) 0.125 MG SUBL Place 1 tablet under the tongue every 6 (six) hours as needed. 30 tablet  0   methocarbamol (ROBAXIN) 500 MG tablet Take 1 tablet (500 mg total) by mouth 2 (two) times daily as needed. 60 tablet 2   methocarbamol (ROBAXIN) 750 MG tablet Take 1 tablet (750 mg total) by mouth every 8 (eight) hours. 180 tablet 0   Multiple Vitamins-Minerals (CENTRUM ADULTS) TABS take 2 tablets by oral route  every day with food     mupirocin ointment (BACTROBAN) 2 % Apply to toes as needed 22 g 1   nystatin (MYCOSTATIN/NYSTOP) powder Apply 1 Application topically 3 (three) times daily. 15 g 5   ofloxacin (FLOXIN) 0.3 % OTIC solution Place 10 drops into both ears daily. 5 mL 0   olmesartan (BENICAR) 20 MG tablet Take 1 tablet (20 mg total) by mouth daily with supper. 90 tablet 3   pantoprazole (PROTONIX) 40 MG tablet Take 1 tablet (40 mg total) by mouth daily. 90 tablet 3   potassium chloride (KLOR-CON M) 10 MEQ tablet Take 1 tablet (10 mEq total) by mouth daily. 90 tablet 1   pregabalin (LYRICA) 150 MG capsule Take 1 capsule (150 mg total) by mouth 3 (three) times daily. Max Daily Amount: 450 mg. 90 capsule 2   pregabalin (LYRICA) 150 MG capsule Take 1 capsule (150 mg total) by mouth 3 (three) times daily. Max Daily Amount: 450 mg. 90 capsule 0   rosuvastatin (CRESTOR) 20 MG tablet Take 1 tablet (20 mg total) by mouth daily. 90 tablet 1   Semaglutide, 2 MG/DOSE, (OZEMPIC, 2 MG/DOSE,) 8 MG/3ML SOPN Inject 2 mg as directed once a week. 3 mL 3   temazepam (RESTORIL) 30 MG capsule Take 1 capsule (30 mg total) by mouth at bedtime as needed for sleep. 90 capsule 1   thiamine (VITAMIN B-1) 100 MG tablet Take 100 mg daily by mouth.     Tocilizumab (ACTEMRA) 80 MG/4ML SOLN injection Inject into the vein. 26.88 mL    triamcinolone ointment (KENALOG) 0.1 % Apply 1 Application (a thin layer) topically in the morning and at bedtime. 30 g 2   Ubrogepant (UBRELVY) 100 MG TABS TAKE ONE TABLET BY MOUTH DAILY AS NEEDED FOR MIGRAINE. MAX 2 TABLETS IN 24 HOURS 16 tablet 5   No current facility-administered  medications on file prior to visit.     ALLERGIES: Allergies  Allergen Reactions   Gabapentin Swelling    Pt states she has eye swelling    Isoniazid     Severe SOB   Morphine Hives and Nausea And Vomiting    Allergic to PCA pump only    Nitrofurantoin Shortness Of Breath, Rash and Other (See Comments)    REACTION: welts   Tamiflu [Oseltamivir Phosphate] Nausea And Vomiting    "  I vomited within 30 minutes of taking it and was told I cannot ever take it again."   Hctz [Hydrochlorothiazide] Rash   Macrolides And Ketolides Rash    rash, rash   Promethazine Hcl Other (See Comments)    IF GIVEN  IV-hallucinations     CAN TAKE PO PHENERGAN Other reaction(s): Unknown   Roxicet [Oxycodone-Acetaminophen] Itching   Wound Dressing Adhesive Rash    Adhesive Tape    Fentanyl Hives and Rash    REACTION: welts   Sulfamethoxazole-Trimethoprim Rash    FAMILY HISTORY: Family History  Problem Relation Age of Onset   Diabetes Mother    Stroke Mother    Breast cancer Mother    Atrial fibrillation Mother    Hypertension Mother    Cancer Mother 56       breast   Diabetes Father    Stroke Father 21       2nd 6 month apart   Hypertension Father    Heart attack Father 33       stents   Dementia Father    AAA (abdominal aortic aneurysm) Father 38       repair   CAD Father 75   Thyroid disease Other    Heart disease Other    COPD Other       Objective:  Blood pressure (!) 140/73, pulse 74, height 5' 9.5" (1.765 m), weight 195 lb (88.5 kg), SpO2 98%. General: No acute distress.  Patient appears well-groomed.   Head:  Normocephalic/atraumatic Eyes:  Fundi examined but not visualized Neck: supple, no paraspinal tenderness, full range of motion Heart:  Regular rate and rhythm Neurological Exam: alert and oriented.  Speech fluent and not dysarthric, language intact.  CN II-XII intact. Bulk and tone normal, muscle strength 5/5 throughout.  Sensation to light touch intact.  Deep tendon  reflexes 1+ throughout.  Finger to nose testing intact.  Antalgic gait.   Shon Millet, DO  CC: Seabron Spates, DO

## 2023-03-15 ENCOUNTER — Encounter: Payer: Self-pay | Admitting: Neurology

## 2023-03-15 ENCOUNTER — Encounter: Payer: Self-pay | Admitting: Plastic Surgery

## 2023-03-15 ENCOUNTER — Ambulatory Visit (INDEPENDENT_AMBULATORY_CARE_PROVIDER_SITE_OTHER): Payer: Commercial Managed Care - PPO | Admitting: Plastic Surgery

## 2023-03-15 ENCOUNTER — Ambulatory Visit: Payer: Commercial Managed Care - PPO | Admitting: Neurology

## 2023-03-15 VITALS — BP 140/73 | HR 74 | Ht 69.5 in | Wt 195.0 lb

## 2023-03-15 VITALS — BP 128/78 | HR 78 | Ht 69.5 in | Wt 195.0 lb

## 2023-03-15 DIAGNOSIS — R21 Rash and other nonspecific skin eruption: Secondary | ICD-10-CM

## 2023-03-15 DIAGNOSIS — M542 Cervicalgia: Secondary | ICD-10-CM | POA: Diagnosis not present

## 2023-03-15 DIAGNOSIS — M793 Panniculitis, unspecified: Secondary | ICD-10-CM | POA: Diagnosis not present

## 2023-03-15 DIAGNOSIS — M4306 Spondylolysis, lumbar region: Secondary | ICD-10-CM

## 2023-03-15 DIAGNOSIS — Z9884 Bariatric surgery status: Secondary | ICD-10-CM | POA: Diagnosis not present

## 2023-03-15 DIAGNOSIS — M5416 Radiculopathy, lumbar region: Secondary | ICD-10-CM

## 2023-03-15 DIAGNOSIS — M5412 Radiculopathy, cervical region: Secondary | ICD-10-CM

## 2023-03-15 DIAGNOSIS — G43019 Migraine without aura, intractable, without status migrainosus: Secondary | ICD-10-CM

## 2023-03-15 DIAGNOSIS — M549 Dorsalgia, unspecified: Secondary | ICD-10-CM

## 2023-03-15 DIAGNOSIS — M47812 Spondylosis without myelopathy or radiculopathy, cervical region: Secondary | ICD-10-CM

## 2023-03-15 NOTE — Progress Notes (Signed)
Patient ID: [name], female    DOB: 1955/12/16, 67 y.o.   MRN: [id number]   Chief Complaint  Patient presents with   Advice Only   Skin Problem    The patient is a 67 year old female here for evaluation of her abdomen.  She was around 286 pounds prior to her weight loss.  Her lowest was around 186 pounds.  She is 5 feet 9 inches tall and today weighs 195 pounds.  She has had several bypass gastric surgeries with a lap band in 2011 and then the lap removal and replacement in 2014 with the gastric sleeve then she had a gastric Roux-en-Y in 2018 which is what she currently has.  The port was removed in 2013 before the lap revision.  This was due to excessive reflux.  She complains of rashes and skin breakdown.  She has tried nystatin powder.  It happens on a regular basis throughout the year.  She is not a smoker.  She does have diabetes and rheumatoid arthritis and is on several medications for that.  She is willing to come off of it if were able to get the surgery approved.  She had a back surgery due to back pain and she was able to get through that by coming off of the immunosuppressive medications.  So the biggest issues are the skin breakdown and the back pain. Her past medical history includes a hyperlipidemia, aortic valve sclerosis, morbid obesity, depression, back pain, skin rashes, inflammatory arthritis, murmur, hypertension, diabetes and iron deficiency anemia. Past surgical history includes the following : Gastric bypass for weight loss, lap band in 2011 that was revised in 2013    Review of Systems  Constitutional: Negative.   HENT: Negative.    Respiratory: Negative.    Cardiovascular: Negative.   Gastrointestinal: Negative.   Genitourinary: Negative.   Musculoskeletal:  Positive for back pain and neck pain.  Skin:  Positive for rash.    Past Medical History:  Diagnosis Date   Allergic rhinitis    Anxiety    Asthma    hx bronchial asthma at times with upper  resp infection   Depression    Fibromyalgia    GERD (gastroesophageal reflux disease)    Headache(784.0)    migraine and cluster headaches   Hyperlipemia    Hypertension    IBS (irritable bowel syndrome)    Menopause    Neuromuscular disorder (HCC) 2009   hx of fibromyalgia, polyarthralsia (surgery induced)   Osteoarthritis    Rheumatoid arthritis (HCC)    Complicated by osteoarthritis as well.   Rheumatoid arthritis (HCC)    Stress incontinence    at times   Tibial fracture 10/14/2016   evulsion periostial right   Type 2 diabetes mellitus with complication, without long-term current use of insulin (HCC)    diet controlled no meds -> however was diagnosed with a diabetic foot ulcer.    Past Surgical History:  Procedure Laterality Date   ABDOMINAL HYSTERECTOMY  12/1999   complete   CARPAL TUNNEL RELEASE Right 12/27/2017   Procedure: CARPAL TUNNEL RELEASE;  Surgeon: Dominica Severin, MD;  Location: [facility];  Service: Orthopedics;  Laterality: Right;   CERVICAL CONIZATION W/BX  june 1990   dysplasia of cervix/used 5Fu cream for 3 months   CERVICAL LAMINECTOMY  2005 & 2009   X 2   NO ROM PROBLEMS   CESAREAN SECTION  1989 & 1993   X 2   CHOLECYSTECTOMY  1986  DILATION AND CURETTAGE OF UTERUS   april-1989   missed abortion   ESOPHAGOGASTRODUODENOSCOPY  06/29/2011   Procedure: ESOPHAGOGASTRODUODENOSCOPY (EGD);  Surgeon: Kandis Cocking, MD;  Location: [facility];  Service: General;  Laterality: N/A;   FOOT SURGERY  2005   RT HEEL   GASTRIC BANDING PORT REVISION  09/24/2011   Procedure: GASTRIC BANDING PORT REVISION;  Surgeon: Valarie Merino, MD;  Location: [facility];  Service: General;  Laterality: N/A;   GASTRIC ROUX-EN-Y N/A 06/06/2017   Procedure: 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;  Surgeon: Luretha Murphy, MD;  Location: [facility];  Service: General;  Laterality: N/A;   HYSTEROSCOPY  1999   LAPAROSCOPIC GASTRIC BANDING  12/30/09    LAPAROSCOPIC GASTRIC SLEEVE RESECTION N/A 07/02/2013   Procedure: LAPAROSCOPIC GASTRIC SLEEVE RESECTION upper endoscopy;  Surgeon: Valarie Merino, MD;  Location: [facility];  Service: General;  Laterality: N/A;   LAPAROSCOPIC REPAIR AND REMOVAL OF GASTRIC BAND N/A 07/02/2013   Procedure: LAPAROSCOPIC REMOVAL OF GASTRIC BAND;  Surgeon: Valarie Merino, MD;  Location: [facility];  Service: General;  Laterality: N/A;   LAPAROSCOPIC REVISION OF GASTRIC BAND  07/03/2012   Procedure: LAPAROSCOPIC REVISION OF GASTRIC BAND;  Surgeon: Valarie Merino, MD;  Location: [facility];  Service: General;  Laterality: N/A;  removal of old lap. band port, replaced with AP standard band   LAPAROSCOPY  09/24/2011   Procedure: LAPAROSCOPY DIAGNOSTIC;  Surgeon: Valarie Merino, MD;  Location: [facility];  Service: General;  Laterality: N/A;   LAPAROSCOPY  07/03/2012   Procedure: LAPAROSCOPY DIAGNOSTIC;  Surgeon: Valarie Merino, MD;  Location: [facility];  Service: General;  Laterality: N/A;  Exploratory Laparoscopy    MESH APPLIED TO LAP PORT  07/03/2012   Procedure: MESH APPLIED TO LAP PORT;  Surgeon: Valarie Merino, MD;  Location: [facility];  Service: General;  Laterality: N/A;   NASAL SINUS SURGERY  1995 & 1997   X 2   right knee  1981   ARTHROSCOPY AND ARTHROTOMY   right knee arthroscopy and arthrotomy  12-1979   TONSILLECTOMY  1971   TUBAL LIGATION  1993   WITH C -SECTION      Current Outpatient Medications:    acetaminophen (TYLENOL) 650 MG CR tablet, Take by mouth., Disp: , Rfl:    Cholecalciferol 125 MCG (5000 UT) TABS, take 1 tablet by oral route every day, Disp: , Rfl:    conjugated estrogens (PREMARIN) vaginal cream, Place 0.5 Applicatorfuls vaginally 3 (three) times a week., Disp: 30 g, Rfl: 5   Cyanocobalamin 3000 MCG/ML LIQD, as directed Sublingual, Disp: , Rfl:    cyclobenzaprine (FLEXERIL) 10 MG tablet, Take 1 tablet (10 mg total) by mouth before bedtime for muscle spasms., Disp: 30 tablet, Rfl: 2    cyclobenzaprine (FLEXERIL) 10 MG tablet, Take 1 tablet (10 mg total) by mouth 3 (three) times daily as needed for muscle spasms., Disp: 90 tablet, Rfl: 1   cyclobenzaprine (FLEXERIL) 10 MG tablet, Take 1 tablet (10 mg total) by mouth every 12 hours as needed, Disp: 180 tablet, Rfl: 0   diclofenac Sodium (VOLTAREN) 1 % GEL, Apply 4 g topically 4 (four) times daily., Disp: 400 g, Rfl: 6   DULoxetine (CYMBALTA) 30 MG capsule, Take 1 capsule (30 mg total) by mouth 2 (two) times daily., Disp: 180 capsule, Rfl: 1   Efinaconazole (JUBLIA) 10 % SOLN, APPLY TO AFFECTED TOENAILS ONCE DAILY FOR 48 WEEKS, Disp: 8 mL,  Rfl: 11   Emollient (DERMEND BRUISE FORMULA) CREA, as directed Externally, Disp: , Rfl:    estradiol (DOTTI) 0.1 MG/24HR patch, Apply 1 patch twice a week by transdermal route., Disp: 24 patch, Rfl: 4   ferric derisomaltose (MONOFERRIC) 1000 MG/10ML SOLN injection, as directed Intravenous, Disp: , Rfl:    furosemide (LASIX) 20 MG tablet, Take 1 tablet (20 mg total) by mouth every morning., Disp: 90 tablet, Rfl: 3   Galcanezumab-gnlm (EMGALITY) 120 MG/ML SOAJ, INJECT 120 MG INTO THE SKIN EVERY 28 (TWENTY-EIGHT) DAYS., Disp: 1 mL, Rfl: 11   glucose blood test strip, Test blood sugar once daily. Dx Code: E11.9, Disp: 100 each, Rfl: 12   hydrALAZINE (APRESOLINE) 25 MG tablet, Take 1 tablet (25 mg total) by mouth 2 (two) times daily. Hold if feeling dizzy or SBP<110 mmHg).  Can use additional 25 mg as needed for SBP>150 mmHg, Disp: 180 tablet, Rfl: 3   hydroxychloroquine (PLAQUENIL) 200 MG tablet, Take 2 tablets (400 mg total) by mouth daily., Disp: 180 tablet, Rfl: 1   Hyoscyamine Sulfate SL (LEVSIN/SL) 0.125 MG SUBL, Place 1 tablet under the tongue every 6 (six) hours as needed., Disp: 30 tablet, Rfl: 0   methocarbamol (ROBAXIN) 500 MG tablet, Take 1 tablet (500 mg total) by mouth 2 (two) times daily as needed., Disp: 60 tablet, Rfl: 2   methocarbamol (ROBAXIN) 750 MG tablet, Take 1 tablet (750 mg  total) by mouth every 8 (eight) hours., Disp: 180 tablet, Rfl: 0   Multiple Vitamins-Minerals (CENTRUM ADULTS) TABS, take 2 tablets by oral route  every day with food, Disp: , Rfl:    mupirocin ointment (BACTROBAN) 2 %, Apply to toes as needed, Disp: 22 g, Rfl: 1   nystatin (MYCOSTATIN/NYSTOP) powder, Apply 1 Application topically 3 (three) times daily., Disp: 15 g, Rfl: 5   ofloxacin (FLOXIN) 0.3 % OTIC solution, Place 10 drops into both ears daily., Disp: 5 mL, Rfl: 0   olmesartan (BENICAR) 20 MG tablet, Take 1 tablet (20 mg total) by mouth daily with supper., Disp: 90 tablet, Rfl: 3   pantoprazole (PROTONIX) 40 MG tablet, Take 1 tablet (40 mg total) by mouth daily., Disp: 90 tablet, Rfl: 3   potassium chloride (KLOR-CON M) 10 MEQ tablet, Take 1 tablet (10 mEq total) by mouth daily., Disp: 90 tablet, Rfl: 1   pregabalin (LYRICA) 150 MG capsule, Take 1 capsule (150 mg total) by mouth 3 (three) times daily. Max Daily Amount: 450 mg., Disp: 90 capsule, Rfl: 2   pregabalin (LYRICA) 150 MG capsule, Take 1 capsule (150 mg total) by mouth 3 (three) times daily. Max Daily Amount: 450 mg., Disp: 90 capsule, Rfl: 0   rosuvastatin (CRESTOR) 20 MG tablet, Take 1 tablet (20 mg total) by mouth daily., Disp: 90 tablet, Rfl: 1   Semaglutide, 2 MG/DOSE, (OZEMPIC, 2 MG/DOSE,) 8 MG/3ML SOPN, Inject 2 mg as directed once a week., Disp: 3 mL, Rfl: 3   temazepam (RESTORIL) 30 MG capsule, Take 1 capsule (30 mg total) by mouth at bedtime as needed for sleep., Disp: 90 capsule, Rfl: 1   thiamine (VITAMIN B-1) 100 MG tablet, Take 100 mg daily by mouth., Disp: , Rfl:    Tocilizumab (ACTEMRA) 80 MG/4ML SOLN injection, Inject into the vein., Disp: 26.88 mL, Rfl:    triamcinolone ointment (KENALOG) 0.1 %, Apply 1 Application (a thin layer) topically in the morning and at bedtime., Disp: 30 g, Rfl: 2   Ubrogepant (UBRELVY) 100 MG TABS, TAKE ONE TABLET BY  MOUTH DAILY AS NEEDED FOR MIGRAINE. MAX 2 TABLETS IN 24 HOURS, Disp: 16  tablet, Rfl: 5   Objective:   There were no vitals filed for this visit.  Physical Exam Vitals and nursing note reviewed.  Constitutional:      Appearance: Normal appearance.  HENT:     Head: Normocephalic and atraumatic.  Cardiovascular:     Rate and Rhythm: Normal rate.     Pulses: Normal pulses.  Pulmonary:     Effort: Pulmonary effort is normal.  Abdominal:     General: There is no distension.     Palpations: Abdomen is soft.  Skin:    General: Skin is warm.     Capillary Refill: Capillary refill takes less than 2 seconds.     Coloration: Skin is not jaundiced.     Findings: No bruising.  Neurological:     Mental Status: She is alert and oriented to person, place, and time.  Psychiatric:        Mood and Affect: Mood normal.        Behavior: Behavior normal.        Thought Content: Thought content normal.        Judgment: Judgment normal.     Assessment & Plan:  Panniculitis  S/P gastric bypass  Lumbar spondylolysis  Spondylosis of cervical spine  Lumbar radiculopathy  Cervical radiculopathy  Patient is a candidate for panniculectomy.  She does seem to realize that she is at a much higher rate of complications due to the diabetes and the rheumatoid arthritis medications.  She is willing to do what ever needs to be done in order to have the surgery because of the repeat infections and back pain.  Plan for panniculectomy with lateral liposuction.  Pictures were obtained of the patient and placed in the chart with the patient's or guardian's permission.   Alena Bills Sherea Liptak, DO

## 2023-03-15 NOTE — Patient Instructions (Signed)
No change in medication now Follow up 5 months.

## 2023-03-16 DIAGNOSIS — M0609 Rheumatoid arthritis without rheumatoid factor, multiple sites: Secondary | ICD-10-CM | POA: Diagnosis not present

## 2023-03-17 ENCOUNTER — Other Ambulatory Visit (HOSPITAL_BASED_OUTPATIENT_CLINIC_OR_DEPARTMENT_OTHER): Payer: Self-pay

## 2023-03-21 ENCOUNTER — Other Ambulatory Visit (HOSPITAL_BASED_OUTPATIENT_CLINIC_OR_DEPARTMENT_OTHER): Payer: Self-pay

## 2023-03-21 DIAGNOSIS — M65331 Trigger finger, right middle finger: Secondary | ICD-10-CM | POA: Diagnosis not present

## 2023-03-21 DIAGNOSIS — E119 Type 2 diabetes mellitus without complications: Secondary | ICD-10-CM | POA: Diagnosis not present

## 2023-03-21 DIAGNOSIS — M65312 Trigger thumb, left thumb: Secondary | ICD-10-CM | POA: Diagnosis not present

## 2023-03-21 DIAGNOSIS — L0390 Cellulitis, unspecified: Secondary | ICD-10-CM | POA: Diagnosis not present

## 2023-03-21 MED ORDER — COVID-19 MRNA VAC-TRIS(PFIZER) 30 MCG/0.3ML IM SUSY
0.3000 mL | PREFILLED_SYRINGE | Freq: Once | INTRAMUSCULAR | 0 refills | Status: AC
  Filled 2023-03-21: qty 0.3, 1d supply, fill #0

## 2023-03-21 MED ORDER — CEPHALEXIN 500 MG PO CAPS
500.0000 mg | ORAL_CAPSULE | Freq: Four times a day (QID) | ORAL | 0 refills | Status: DC
  Filled 2023-03-21: qty 56, 14d supply, fill #0

## 2023-03-23 ENCOUNTER — Telehealth: Payer: Self-pay

## 2023-03-23 NOTE — Telephone Encounter (Signed)
-----   Message from Alena Bills Dillingham sent at 03/15/2023 11:42 AM EDT ----- Can you fill out a preop clearance for [place]

## 2023-03-23 NOTE — Telephone Encounter (Signed)
Faxed surgical clearance to Dr. Everlena Cooper to obtain clearance for Emgality. Received fax success confirmation; will update surgical sheet in Teams. Will hold original documents until I receive completed and signed clearance from Dr. Everlena Cooper. Will forward to [facility] when received.

## 2023-03-23 NOTE — Telephone Encounter (Signed)
Faxed surgical clearance to Dr. Almeta Monas to obtain clearance for Plaquenil and Ozempic. Received fax success confirmation; will update surgical sheet in Teams. Will hold original documents until I receive completed and signed clearance from Dr. Almeta Monas and forward to [facility] when received.

## 2023-03-25 ENCOUNTER — Ambulatory Visit (INDEPENDENT_AMBULATORY_CARE_PROVIDER_SITE_OTHER): Payer: Commercial Managed Care - PPO

## 2023-03-25 ENCOUNTER — Encounter: Payer: Self-pay | Admitting: Podiatry

## 2023-03-25 ENCOUNTER — Ambulatory Visit (INDEPENDENT_AMBULATORY_CARE_PROVIDER_SITE_OTHER): Payer: Commercial Managed Care - PPO | Admitting: Podiatry

## 2023-03-25 VITALS — BP 119/70 | HR 70

## 2023-03-25 DIAGNOSIS — M79675 Pain in left toe(s): Secondary | ICD-10-CM

## 2023-03-25 DIAGNOSIS — B351 Tinea unguium: Secondary | ICD-10-CM | POA: Diagnosis not present

## 2023-03-25 DIAGNOSIS — E1142 Type 2 diabetes mellitus with diabetic polyneuropathy: Secondary | ICD-10-CM

## 2023-03-25 DIAGNOSIS — S92514A Nondisplaced fracture of proximal phalanx of right lesser toe(s), initial encounter for closed fracture: Secondary | ICD-10-CM | POA: Diagnosis not present

## 2023-03-25 DIAGNOSIS — M79674 Pain in right toe(s): Secondary | ICD-10-CM

## 2023-03-25 DIAGNOSIS — M2041 Other hammer toe(s) (acquired), right foot: Secondary | ICD-10-CM

## 2023-03-28 ENCOUNTER — Other Ambulatory Visit: Payer: Self-pay

## 2023-03-28 ENCOUNTER — Other Ambulatory Visit (HOSPITAL_BASED_OUTPATIENT_CLINIC_OR_DEPARTMENT_OTHER): Payer: Self-pay

## 2023-03-31 DIAGNOSIS — Z9884 Bariatric surgery status: Secondary | ICD-10-CM | POA: Diagnosis not present

## 2023-03-31 DIAGNOSIS — M793 Panniculitis, unspecified: Secondary | ICD-10-CM | POA: Diagnosis not present

## 2023-03-31 NOTE — Progress Notes (Signed)
Subjective:  Patient ID: [name], female    DOB: 1955-07-26,  MRN: [id number]  [name] presents to clinic today for preventative diabetic foot care. Chief Complaint  Patient presents with   Diabetes    "Check my toes, cut my nails, and trim my calluses."   Toe Injury    "I think I broke my little toe." N - painful toe L - 5th digit right D - 12 days ago O - suddenly C - throb A - walking sometime T - buddy taped it   PCP is [employer] , [employer], DO.  Allergies  Allergen Reactions   Gabapentin Swelling    Pt states she has eye swelling    Isoniazid     Severe SOB   Morphine Hives and Nausea And Vomiting    Allergic to PCA pump only    Nitrofurantoin Shortness Of Breath, Rash and Other (See Comments)    REACTION: welts   Tamiflu [Oseltamivir Phosphate] Nausea And Vomiting    "I vomited within 30 minutes of taking it and was told I cannot ever take it again."   Hctz [Hydrochlorothiazide] Rash   Macrolides And Ketolides Rash   Promethazine Other (See Comments)    Other Reaction(s): Psychosis  IF GIVEN  IV-hallucinations     CAN TAKE PO PHENERGAN, , Other reaction(s): Hallucinations, IF GIVEN  IV-hallucinations     CAN TAKE PO PHENERGAN, Other reaction(s): Unknown   Wound Dressing Adhesive Rash    Adhesive Tape    Fentanyl Hives, Itching and Rash    REACTION: welts   Sulfamethoxazole-Trimethoprim Rash    Review of Systems: Negative except as noted in the HPI.  Objective: Note change in today's physical examination. Vitals:   03/25/23 0909  BP: 119/70  Pulse: 70   [name] is a pleasant 67 y.o. female in NAD. AAO x 3.  Vascular Examination: Capillary refill time immediate b/l. Vascular status intact b/l with palpable pedal pulses. Pedal hair present b/l. No edema. No pain with calf compression b/l. Skin temperature gradient WNL b/l.   Neurological Examination: Sensation grossly intact b/l with 10 gram monofilament. Vibratory sensation  intact b/l. Pt has subjective symptoms of neuropathy.  Dermatological Examination: Pedal skin with normal turgor, texture and tone b/l.  No open wounds. No interdigital macerations.   Toenails 1-5 b/l dystrophic.   No hyperkeratotic nor porokeratotic lesions present on today's visit.  Musculoskeletal Examination: Muscle strength 5/5 to all lower extremity muscle groups bilaterally. HAV with bunion deformity noted b/l LE. Clawtoe deformity 1-5 bilaterally. Right 5th digit with mild edema.  Xray findings right foot: Soft tissue swelling present R 5th toe. -Transverse fracture noted head of proximal phalanx, nondisplaced in good alignment.  Assessment/Plan: 1. Pain due to onychomycosis of toenails of both feet   2. Closed nondisplaced fracture of proximal phalanx of lesser toe of right foot, initial encounter   3. Diabetic peripheral neuropathy associated with type 2 diabetes mellitus (HCC)     Orders Placed This Encounter  Procedures   DG Foot Complete Right    Standing Status:   Future    Number of Occurrences:   1    Standing Expiration Date:   03/24/2024    Order Specific Question:   Reason for Exam (SYMPTOM  OR DIAGNOSIS REQUIRED)    Answer:   pain    Order Specific Question:   Preferred imaging location?    Answer:   Internal    -Consent given for treatment  as described below: -Examined patient. -Discussed fracture with patient. Buddy splinted right 5th digit to right 4th toe. She is to continue daily.. -Continue supportive shoe gear daily. -Toenails 1-5 b/l were debrided in length and girth with sterile nail nippers and dremel without iatrogenic bleeding.  -Xray of right foot was performed and reviewed with patient and/or POA. -Patient/POA to call should there be question/concern in the interim.   Return in about 1 month (around 04/24/2023) for with Dr. Lilian Kapur for f/u of fracture right 5th toe. F/U with me in 3 months for diabetic foot care.  Freddie Breech, DPM

## 2023-04-04 ENCOUNTER — Encounter: Payer: Self-pay | Admitting: Family

## 2023-04-06 ENCOUNTER — Other Ambulatory Visit: Payer: Self-pay

## 2023-04-06 ENCOUNTER — Other Ambulatory Visit: Payer: Self-pay | Admitting: Family Medicine

## 2023-04-06 ENCOUNTER — Encounter: Payer: Self-pay | Admitting: Neurology

## 2023-04-06 ENCOUNTER — Other Ambulatory Visit (HOSPITAL_BASED_OUTPATIENT_CLINIC_OR_DEPARTMENT_OTHER): Payer: Self-pay

## 2023-04-06 DIAGNOSIS — E118 Type 2 diabetes mellitus with unspecified complications: Secondary | ICD-10-CM

## 2023-04-06 MED ORDER — OZEMPIC (2 MG/DOSE) 8 MG/3ML ~~LOC~~ SOPN
2.0000 mg | PEN_INJECTOR | SUBCUTANEOUS | 3 refills | Status: DC
Start: 2023-04-06 — End: 2023-04-22
  Filled 2023-04-06: qty 3, 28d supply, fill #0

## 2023-04-12 ENCOUNTER — Other Ambulatory Visit (HOSPITAL_BASED_OUTPATIENT_CLINIC_OR_DEPARTMENT_OTHER): Payer: Self-pay

## 2023-04-12 MED ORDER — FLUAD 0.5 ML IM SUSY
PREFILLED_SYRINGE | Freq: Once | INTRAMUSCULAR | 0 refills | Status: AC
  Filled 2023-04-12: qty 0.5, 1d supply, fill #0

## 2023-04-18 ENCOUNTER — Other Ambulatory Visit (HOSPITAL_BASED_OUTPATIENT_CLINIC_OR_DEPARTMENT_OTHER): Payer: Self-pay

## 2023-04-18 ENCOUNTER — Other Ambulatory Visit: Payer: Self-pay

## 2023-04-19 ENCOUNTER — Other Ambulatory Visit (HOSPITAL_BASED_OUTPATIENT_CLINIC_OR_DEPARTMENT_OTHER): Payer: Self-pay

## 2023-04-20 ENCOUNTER — Other Ambulatory Visit (HOSPITAL_BASED_OUTPATIENT_CLINIC_OR_DEPARTMENT_OTHER): Payer: Self-pay

## 2023-04-20 MED ORDER — PREDNISONE 5 MG PO TABS
ORAL_TABLET | ORAL | 0 refills | Status: AC
  Filled 2023-04-20: qty 63, 18d supply, fill #0

## 2023-04-21 ENCOUNTER — Ambulatory Visit: Payer: Commercial Managed Care - PPO | Admitting: Podiatry

## 2023-04-21 DIAGNOSIS — S92514S Nondisplaced fracture of proximal phalanx of right lesser toe(s), sequela: Secondary | ICD-10-CM

## 2023-04-21 DIAGNOSIS — M21961 Unspecified acquired deformity of right lower leg: Secondary | ICD-10-CM | POA: Diagnosis not present

## 2023-04-21 DIAGNOSIS — M2042 Other hammer toe(s) (acquired), left foot: Secondary | ICD-10-CM

## 2023-04-21 DIAGNOSIS — M2041 Other hammer toe(s) (acquired), right foot: Secondary | ICD-10-CM | POA: Diagnosis not present

## 2023-04-21 NOTE — Patient Instructions (Signed)
Hammer Toe Hammer toe is a change in the shape, or a deformity, of the toe. The deformity causes the middle joint of the toe to stay bent. Hammer toe starts gradually. At first, the toe can be straightened. Then over time, the toe deformity becomes stiff, inflexible, and permanently bent. Hammer toe usually affects the second, third, or fourth toe. A hammer toe causes pain, especially when wearing shoes. Corns and calluses can result from the toe rubbing against the inside of the shoe. Early treatments to keep the toe straight may relieve pain. As the deformity of the toe becomes stiff and permanent, surgery may be needed to straighten the toe. What are the causes? This condition is caused by abnormal bending of the toe joint that is closest to your foot. Over time, the toe bending downward pulls on the muscles and connections (tendons) of the toe joint, making them weak and stiff. Wearing shoes that are too narrow in the toe box and do not allow toes to fully straighten can cause this condition. What increases the risk? You are more likely to develop this condition if you: Are an older female. Wear shoes that are too small, or wear high-heeled shoes that pinch your toes. Have a second toe that is longer than your big toe (first toe). Injure your foot or toe. Have arthritis, or have a nerve or muscle disorder. Have diabetes or a condition known as Charcot joint, which may cause you to walk abnormally. Have a family history of hammer toe. Are a Advertising account planner. What are the signs or symptoms? Pain and deformity of the toe are the main symptoms of this condition. The pain is worse when wearing shoes, walking, or running. Other symptoms may include: A thickened patch of skin, called a corn or callus, that forms over the top of the bent part of the toe or between the toes. Redness and a burning feeling on the bent toe. An open sore that forms on the top of the bent toe. Not being able to straighten  the affected toe. How is this diagnosed? This condition is diagnosed based on your symptoms and a physical exam. During the exam, your health care provider will try to straighten your toe to see how stiff the deformity is. You may also have tests, such as: A blood test to check for rheumatoid arthritis or diabetes. An X-ray to show how severe the toe deformity is. How is this treated? Treatment for this condition depends on whether the toe is flexible or deformed and no longer moveable. In less severe cases, a hammer toe can be straightened without surgery. These treatments include: Taping the toe into a straightened position. Using pads and cushions to protect the bent toe. Wearing shoes that provide enough room for the toes. Doing toe-stretching exercises at home. Taking an NSAID, such as ibuprofen, to reduce pain and swelling. Using special orthotics or insoles for pain relief and to improve walking. If these treatments do not help or the toe has a severe deformity and cannot be straightened, surgery is the next option. The most common surgeries used to straighten a hammer toe include: Arthroplasty or osteotomy. Part of the toe joint is reconstructed or removed, which allows the toe to straighten. Fusion. Cartilage between the two bones of the joint is taken out, and the bones are fused together into one longer bone. Implantation. Part of the bone is removed and replaced with an implant to allow the toe to move again. Flexor tendon transfer.  The tendons that curl the toes down (flexor tendons) are repositioned. Follow these instructions at home: Take over-the-counter and prescription medicines only as told by your health care provider. Do toe-straightening and stretching exercises as told by your health care provider. Keep all follow-up visits. This is important. How is this prevented? Wear shoes that fit properly and give your toes enough room. Shoes should not cause pain. Buy shoes at  the end of the day to make sure they fit well, since your foot may swell during the day. Make sure they are comfortable before you buy them. As you age, your shoe size might change, including the width. Measure both feet and buy shoes for the larger foot. A shoe repair store might be able to stretch shoes that feel tight in spots. Do not wear high-heeled shoes or shoes with pointed toes. Contact a health care provider if: Your pain gets worse. Your toe becomes red or swollen. You develop an open sore on your toe. Summary Hammer toe is a condition that gradually causes your toe to become bent and stiff. Hammer toe can be treated by taping the toe into a straightened position and doing toe-stretching exercises. If these treatments do not help, surgery may be needed. To prevent this condition, wear shoes that fit properly, give your toes enough room, and do not cause pain. This information is not intended to replace advice given to you by your health care provider. Make sure you discuss any questions you have with your health care provider. Document Revised: 10/04/2019 Document Reviewed: 10/04/2019 Elsevier Patient Education  2024 [employer].

## 2023-04-22 ENCOUNTER — Other Ambulatory Visit (HOSPITAL_BASED_OUTPATIENT_CLINIC_OR_DEPARTMENT_OTHER): Payer: Self-pay

## 2023-04-22 ENCOUNTER — Telehealth: Payer: Self-pay | Admitting: Family Medicine

## 2023-04-22 ENCOUNTER — Other Ambulatory Visit (HOSPITAL_COMMUNITY)
Admission: RE | Admit: 2023-04-22 | Discharge: 2023-04-22 | Disposition: A | Discharge status: Home / Self Care | Payer: Commercial Managed Care - PPO | Source: Ambulatory Visit | Attending: Family Medicine | Admitting: Family Medicine

## 2023-04-22 ENCOUNTER — Ambulatory Visit: Payer: Commercial Managed Care - PPO | Admitting: Family Medicine

## 2023-04-22 VITALS — BP 122/78 | HR 70 | Temp 97.7°F | Resp 18 | Ht 69.5 in | Wt 196.0 lb

## 2023-04-22 DIAGNOSIS — I771 Stricture of artery: Secondary | ICD-10-CM | POA: Diagnosis not present

## 2023-04-22 DIAGNOSIS — E538 Deficiency of other specified B group vitamins: Secondary | ICD-10-CM

## 2023-04-22 DIAGNOSIS — R110 Nausea: Secondary | ICD-10-CM

## 2023-04-22 DIAGNOSIS — N898 Other specified noninflammatory disorders of vagina: Secondary | ICD-10-CM

## 2023-04-22 DIAGNOSIS — B354 Tinea corporis: Secondary | ICD-10-CM

## 2023-04-22 DIAGNOSIS — F418 Other specified anxiety disorders: Secondary | ICD-10-CM

## 2023-04-22 DIAGNOSIS — D696 Thrombocytopenia, unspecified: Secondary | ICD-10-CM

## 2023-04-22 DIAGNOSIS — I10 Essential (primary) hypertension: Secondary | ICD-10-CM

## 2023-04-22 DIAGNOSIS — E118 Type 2 diabetes mellitus with unspecified complications: Secondary | ICD-10-CM | POA: Diagnosis not present

## 2023-04-22 DIAGNOSIS — K219 Gastro-esophageal reflux disease without esophagitis: Secondary | ICD-10-CM

## 2023-04-22 DIAGNOSIS — E1165 Type 2 diabetes mellitus with hyperglycemia: Secondary | ICD-10-CM

## 2023-04-22 DIAGNOSIS — R748 Abnormal levels of other serum enzymes: Secondary | ICD-10-CM

## 2023-04-22 DIAGNOSIS — E559 Vitamin D deficiency, unspecified: Secondary | ICD-10-CM | POA: Diagnosis not present

## 2023-04-22 DIAGNOSIS — M0579 Rheumatoid arthritis with rheumatoid factor of multiple sites without organ or systems involvement: Secondary | ICD-10-CM

## 2023-04-22 DIAGNOSIS — E11621 Type 2 diabetes mellitus with foot ulcer: Secondary | ICD-10-CM

## 2023-04-22 DIAGNOSIS — Z7985 Long-term (current) use of injectable non-insulin antidiabetic drugs: Secondary | ICD-10-CM

## 2023-04-22 DIAGNOSIS — G4709 Other insomnia: Secondary | ICD-10-CM

## 2023-04-22 DIAGNOSIS — E1169 Type 2 diabetes mellitus with other specified complication: Secondary | ICD-10-CM

## 2023-04-22 DIAGNOSIS — E785 Hyperlipidemia, unspecified: Secondary | ICD-10-CM

## 2023-04-22 DIAGNOSIS — L97511 Non-pressure chronic ulcer of other part of right foot limited to breakdown of skin: Secondary | ICD-10-CM

## 2023-04-22 LAB — CBC WITH DIFFERENTIAL/PLATELET
Basophils Absolute: 0.1 10*3/uL (ref 0.0–0.1)
Basophils Relative: 1 % (ref 0.0–3.0)
Eosinophils Absolute: 0.1 10*3/uL (ref 0.0–0.7)
Eosinophils Relative: 1.9 % (ref 0.0–5.0)
HCT: 42.8 % (ref 36.0–46.0)
Hemoglobin: 13.8 g/dL (ref 12.0–15.0)
Lymphocytes Relative: 27.1 % (ref 12.0–46.0)
Lymphs Abs: 1.5 10*3/uL (ref 0.7–4.0)
MCHC: 32.3 g/dL (ref 30.0–36.0)
MCV: 91.4 fL (ref 78.0–100.0)
Monocytes Absolute: 0.7 10*3/uL (ref 0.1–1.0)
Monocytes Relative: 13.2 % — ABNORMAL HIGH (ref 3.0–12.0)
Neutro Abs: 3.1 10*3/uL (ref 1.4–7.7)
Neutrophils Relative %: 56.8 % (ref 43.0–77.0)
Platelets: 182 10*3/uL (ref 150.0–400.0)
RBC: 4.68 Mil/uL (ref 3.87–5.11)
RDW: 13.1 % (ref 11.5–15.5)
WBC: 5.4 10*3/uL (ref 4.0–10.5)

## 2023-04-22 LAB — COMPREHENSIVE METABOLIC PANEL
ALT: 29 U/L (ref 0–35)
AST: 28 U/L (ref 0–37)
Albumin: 4 g/dL (ref 3.5–5.2)
Alkaline Phosphatase: 70 U/L (ref 39–117)
BUN: 10 mg/dL (ref 6–23)
CO2: 32 meq/L (ref 19–32)
Calcium: 9.5 mg/dL (ref 8.4–10.5)
Chloride: 100 meq/L (ref 96–112)
Creatinine, Ser: 0.72 mg/dL (ref 0.40–1.20)
GFR: 87.2 mL/min (ref >60.00)
Glucose, Bld: 75 mg/dL (ref 70–99)
Potassium: 4.6 meq/L (ref 3.5–5.1)
Sodium: 139 meq/L (ref 135–145)
Total Bilirubin: 0.9 mg/dL (ref 0.2–1.2)
Total Protein: 6.2 g/dL (ref 6.0–8.3)

## 2023-04-22 LAB — LIPID PANEL
Cholesterol: 118 mg/dL (ref 0–200)
HDL: 70 mg/dL (ref >39.00)
LDL Cholesterol: 38 mg/dL (ref 0–99)
NonHDL: 48.41
Total CHOL/HDL Ratio: 2
Triglycerides: 50 mg/dL (ref 0.0–149.0)
VLDL: 10 mg/dL (ref 0.0–40.0)

## 2023-04-22 LAB — HEMOGLOBIN A1C: Hgb A1c MFr Bld: 5.5 % (ref 4.6–6.5)

## 2023-04-22 LAB — MICROALBUMIN / CREATININE URINE RATIO
Creatinine,U: 9.8 mg/dL
Microalb Creat Ratio: 7.1 mg/g (ref 0.0–30.0)
Microalb, Ur: <0.7 mg/dL (ref 0.0–1.9)

## 2023-04-22 LAB — VITAMIN B12: Vitamin B-12: 234 pg/mL (ref 211–911)

## 2023-04-22 LAB — VITAMIN D 25 HYDROXY (VIT D DEFICIENCY, FRACTURES): VITD: 42.15 ng/mL (ref 30.00–100.00)

## 2023-04-22 MED ORDER — OLMESARTAN MEDOXOMIL 20 MG PO TABS: 20.0000 mg | ORAL_TABLET | Freq: Every day | ORAL | 3 refills | Status: DC

## 2023-04-22 MED ORDER — DICLOFENAC SODIUM 1 % EX GEL: 4.0000 g | Freq: Four times a day (QID) | CUTANEOUS | 6 refills | Status: DC

## 2023-04-22 MED ORDER — TIRZEPATIDE 5 MG/0.5ML ~~LOC~~ SOAJ
5.0000 mg | SUBCUTANEOUS | 0 refills | Status: DC
  Filled 2023-04-22: qty 2, 28d supply, fill #0
  Filled 2023-05-15: qty 2, 28d supply, fill #1

## 2023-04-22 MED ORDER — TEMAZEPAM 30 MG PO CAPS
30.0000 mg | ORAL_CAPSULE | Freq: Every evening | ORAL | 1 refills | Status: DC | PRN
Start: 2023-04-22 — End: 2023-10-21
  Filled 2023-04-22 – 2023-07-05 (×2): qty 90, 90d supply, fill #0

## 2023-04-22 MED ORDER — ROSUVASTATIN CALCIUM 20 MG PO TABS
20.0000 mg | ORAL_TABLET | Freq: Every day | ORAL | 1 refills | Status: DC
Start: 2023-04-22 — End: 2023-04-22

## 2023-04-22 MED ORDER — TIRZEPATIDE 5 MG/0.5ML ~~LOC~~ SOAJ
5.0000 mg | SUBCUTANEOUS | 0 refills | Status: DC
Start: 2023-04-22 — End: 2023-04-22

## 2023-04-22 MED ORDER — POTASSIUM CHLORIDE CRYS ER 10 MEQ PO TBCR: 10.0000 meq | EXTENDED_RELEASE_TABLET | Freq: Every day | ORAL | 1 refills | Status: DC

## 2023-04-22 MED ORDER — OLMESARTAN MEDOXOMIL 20 MG PO TABS
20.0000 mg | ORAL_TABLET | Freq: Every day | ORAL | 3 refills | Status: DC
  Filled 2023-04-22 – 2023-06-15 (×2): qty 90, 90d supply, fill #0

## 2023-04-22 MED ORDER — ROSUVASTATIN CALCIUM 20 MG PO TABS
20.0000 mg | ORAL_TABLET | Freq: Every day | ORAL | 1 refills | Status: DC
  Filled 2023-04-22: qty 90, 90d supply, fill #0
  Filled 2023-08-02: qty 90, 90d supply, fill #1

## 2023-04-22 MED ORDER — ONDANSETRON HCL 4 MG PO TABS: 4.0000 mg | ORAL_TABLET | Freq: Three times a day (TID) | ORAL | 0 refills | Status: DC | PRN

## 2023-04-22 MED ORDER — POTASSIUM CHLORIDE CRYS ER 10 MEQ PO TBCR
10.0000 meq | EXTENDED_RELEASE_TABLET | Freq: Every day | ORAL | 1 refills | Status: DC
  Filled 2023-04-22 – 2023-07-05 (×2): qty 90, 90d supply, fill #0
  Filled 2023-12-08: qty 90, 90d supply, fill #1

## 2023-04-22 MED ORDER — TEMAZEPAM 30 MG PO CAPS
30.0000 mg | ORAL_CAPSULE | Freq: Every evening | ORAL | 1 refills | Status: DC | PRN
Start: 2023-04-22 — End: 2023-04-22

## 2023-04-22 MED ORDER — DULOXETINE HCL 30 MG PO CPEP: 30.0000 mg | ORAL_CAPSULE | Freq: Two times a day (BID) | ORAL | 1 refills | Status: DC

## 2023-04-22 MED ORDER — TEMAZEPAM 30 MG PO CAPS: 30.0000 mg | ORAL_CAPSULE | Freq: Every evening | ORAL | 1 refills | Status: DC | PRN

## 2023-04-22 MED ORDER — DULOXETINE HCL 30 MG PO CPEP
30.0000 mg | ORAL_CAPSULE | Freq: Two times a day (BID) | ORAL | 1 refills | Status: DC
  Filled 2023-04-22 – 2023-07-01 (×2): qty 180, 90d supply, fill #0
  Filled 2023-10-02: qty 180, 90d supply, fill #1

## 2023-04-22 MED ORDER — ONDANSETRON HCL 4 MG PO TABS
4.0000 mg | ORAL_TABLET | Freq: Three times a day (TID) | ORAL | 0 refills | Status: DC | PRN
  Filled 2023-04-22: qty 20, 7d supply, fill #0

## 2023-04-22 NOTE — Assessment & Plan Note (Signed)
Per rheum 

## 2023-04-22 NOTE — Assessment & Plan Note (Signed)
Well controlled, no changes to meds. Encouraged heart healthy diet such as the DASH diet and exercise as tolerated.  °

## 2023-04-22 NOTE — Assessment & Plan Note (Signed)
hgba1c to be checked, minimize simple carbs. Increase exercise as tolerated. Continue current meds  

## 2023-04-22 NOTE — Telephone Encounter (Signed)
Rxs were resent downstairs

## 2023-04-22 NOTE — Assessment & Plan Note (Signed)
Tolerating statin, encouraged heart healthy diet, avoid trans fats, minimize simple carbs and saturated fats. Increase exercise as tolerated 

## 2023-04-22 NOTE — Telephone Encounter (Signed)
Pt called and stated that she needs all of her prescriptions that were sent 04/22/23 to be sent to [facility] pharmacy. Please advise.

## 2023-04-22 NOTE — Progress Notes (Signed)
Established Patient Office Visit  Subjective   Patient ID: [name], female    DOB: 1955/08/23  Age: 67 y.o. MRN: [id number]  Chief Complaint  Patient presents with   Hyperlipidemia   Follow-up    HPI Discussed the use of AI scribe software for clinical note transcription with the patient, who gave verbal consent to proceed.  History of Present Illness   The patient, with a history of diabetes and rheumatoid arthritis (RA), presents with multiple concerns. She reports good blood sugar control with readings in the 80s and 90s. However, she expresses dissatisfaction with her current diabetes medication, Ozempic, and is considering switching to Trulicity for better diabetes management and weight loss.  The patient's RA symptoms have worsened over the past month and a half, despite a change in medication to Simponi three months ago due to elevated liver enzymes. She reports frequent pain, waking up at night, and difficulty getting out of chairs. She is currently managing the pain with Voltaren gel, Flexeril, and Robaxin.  The patient also reports fluid retention and a skin condition that has been denied coverage by insurance. She is currently in the process of appealing this decision. She also expresses frustration with her insurance coverage, particularly regarding the denial of coverage for certain medications.  The patient requests a prescription for odansetron to manage nausea associated with her current medications. She also requests refills for several other medications including duloxetine, ruvastatin, pantoprazole, olmesartan, potassium, and temazepam.      Patient Active Problem List   Diagnosis Date Noted   Panniculitis 03/15/2023   Unilateral primary osteoarthritis, right knee 01/24/2023   Family history of abdominal aortic aneurysm (AAA) repair 07/23/2022   Synovitis of toe 04/19/2022   Preventative health care 10/22/2021   Colon cancer screening 08/05/2021    Diabetic ulcer of toe of right foot associated with type 2 diabetes mellitus, limited to breakdown of skin (HCC) 06/19/2021   Tinea corporis 06/19/2021   Dysuria 06/19/2021   IDA (iron deficiency anemia) 05/18/2021   Pre-operative clearance 04/21/2021   Controlled type 2 diabetes mellitus with hyperglycemia, without long-term current use of insulin (HCC) 04/21/2021   Spinal stenosis of lumbar region 04/21/2021   Bronchitis due to COVID-19 virus 03/27/2021   Orthostatic hypotension 01/09/2021   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   Spondylosis of cervical spine 10/07/2020   Lumbar radiculopathy 10/07/2020   Anemia 09/11/2020   Intractable migraine 08/26/2020   Inflammatory arthritis 07/22/2020   Thrombocytopenia (HCC) 07/22/2020   Chronic nonintractable headache 07/21/2020   Syncope 07/21/2020   Dizziness 07/21/2020   Cellulitis of right hand 07/21/2020   Lumbar spondylolysis 05/13/2020   Primary hypertension 04/02/2020   Body mass index (BMI) 29.0-29.9, adult 04/02/2020   Menopausal symptom 03/11/2020   Stress 03/11/2020   Unspecified dyspareunia (CODE) 03/11/2020   Bilateral sacroiliitis (HCC) 03/04/2020   Senile calcific aortic valve sclerosis 02/08/2020   DOE (dyspnea on exertion) 02/08/2020   Murmur 01/07/2020   Acquired trigger finger of right ring finger 10/25/2019   Pain in right knee 08/24/2019   Rheumatoid arthritis involving multiple sites with positive rheumatoid factor (HCC) 08/02/2019   Primary osteoarthritis of both knees 08/02/2019   PTSD (post-traumatic stress disorder) 02/08/2019   Dog bite of arm, right, initial encounter 10/05/2018   Hyperlipidemia associated with type 2 diabetes mellitus (HCC) 08/24/2018   Symptomatic abdominal panniculus 12/12/2017   Carpal tunnel syndrome of left wrist 11/28/2017   Pain in right hand 11/08/2017  Cervical radiculopathy 10/06/2017   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   Tibial fracture 10/14/2016   Keratosis  12/04/2014   Onychocryptosis 08/21/2014   Onychomycosis 05/21/2014   Fissured skin 01/22/2014   Fissure in skin of foot 12/18/2013   Porokeratosis 12/18/2013   Changes in skin texture 12/18/2013   Pain in lower limb 11/21/2013   Ingrown nail 07/20/2013   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 07/02/2013   Obesity (BMI 30-39.9) 06/25/2013   GERD (gastroesophageal reflux disease) 08/20/2011   Lapband APL + HH repair June 2011-Major revision Dec 2013 06/29/2011   Abdominal pain 08/01/2010   VITAMIN B12 DEFICIENCY 04/01/2010   BARIATRIC SURGERY STATUS 01/29/2010   ONYCHOMYCOSIS, BILATERAL 06/30/2009   STRESS INCONTINENCE 06/10/2009   ACUTE PHARYNGITIS 04/29/2009   COUGH 04/27/2009   BRONCHITIS, ACUTE 04/16/2009   NAUSEA 04/08/2009   DIARRHEA 04/08/2009   MORBID OBESITY 03/03/2009   SKIN RASH 11/18/2008   UNSPECIFIED VITAMIN D DEFICIENCY 07/15/2008   OTHER SPECIFIED ANEMIAS 07/15/2008   Pain in joint, multiple sites 06/13/2008   Myalgia and myositis, unspecified 06/13/2008   Myalgia 06/13/2008   BACK PAIN, LUMBAR 02/14/2008   ACUTE SINUSITIS, UNSPECIFIED 09/01/2007   CELLULITIS 08/12/2007   Cellulitis 08/12/2007   Depression with anxiety 06/09/2007   DEPRESSION 06/09/2007   Controlled type 2 diabetes mellitus with diabetic neuropathy, without long-term current use of insulin (HCC) 01/04/2007   Labile hypertension 01/04/2007   ALLERGIC RHINITIS 01/04/2007   HEADACHE 01/04/2007   Anxiety 12/02/1998   Past Medical History:  Diagnosis Date   Allergic rhinitis    Anxiety    Asthma    hx bronchial asthma at times with upper resp infection   Depression    Fibromyalgia    GERD (gastroesophageal reflux disease)    Headache(784.0)    migraine and cluster headaches   Hyperlipemia    Hypertension    IBS (irritable bowel syndrome)    Menopause    Neuromuscular disorder (HCC) 2009   hx of fibromyalgia, polyarthralsia (surgery induced)   Osteoarthritis     Rheumatoid arthritis (HCC)    Complicated by osteoarthritis as well.   Rheumatoid arthritis (HCC)    Stress incontinence    at times   Tibial fracture 10/14/2016   evulsion periostial right   Type 2 diabetes mellitus with complication, without long-term current use of insulin (HCC)    diet controlled no meds -> however was diagnosed with a diabetic foot ulcer.   Past Surgical History:  Procedure Laterality Date   ABDOMINAL HYSTERECTOMY  12/1999   complete   CARPAL TUNNEL RELEASE Right 12/27/2017   Procedure: CARPAL TUNNEL RELEASE;  Surgeon: Dominica Severin, MD;  Location: [facility];  Service: Orthopedics;  Laterality: Right;   CERVICAL CONIZATION W/BX  june 1990   dysplasia of cervix/used 5Fu cream for 3 months   CERVICAL LAMINECTOMY  2005 & 2009   X 2   NO ROM PROBLEMS   CESAREAN SECTION  1989 & 1993   X 2   CHOLECYSTECTOMY  1986   DILATION AND CURETTAGE OF UTERUS   [phone]   missed abortion   ESOPHAGOGASTRODUODENOSCOPY  06/29/2011   Procedure: ESOPHAGOGASTRODUODENOSCOPY (EGD);  Surgeon: Kandis Cocking, MD;  Location: [facility];  Service: General;  Laterality: N/A;   FOOT SURGERY  2005   RT HEEL   GASTRIC BANDING PORT REVISION  09/24/2011   Procedure: GASTRIC BANDING PORT REVISION;  Surgeon: Valarie Merino, MD;  Location: [facility];  Service: General;  Laterality:  N/A;   GASTRIC ROUX-EN-Y N/A 06/06/2017   Procedure: 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;  Surgeon: Luretha Murphy, MD;  Location: [facility];  Service: General;  Laterality: N/A;   HYSTEROSCOPY  1999   LAPAROSCOPIC GASTRIC BANDING  12/30/09   LAPAROSCOPIC GASTRIC SLEEVE RESECTION N/A 07/02/2013   Procedure: LAPAROSCOPIC GASTRIC SLEEVE RESECTION upper endoscopy;  Surgeon: Valarie Merino, MD;  Location: [facility];  Service: General;  Laterality: N/A;   LAPAROSCOPIC REPAIR AND REMOVAL OF GASTRIC BAND N/A 07/02/2013   Procedure: LAPAROSCOPIC REMOVAL OF GASTRIC BAND;  Surgeon:  Valarie Merino, MD;  Location: [facility];  Service: General;  Laterality: N/A;   LAPAROSCOPIC REVISION OF GASTRIC BAND  07/03/2012   Procedure: LAPAROSCOPIC REVISION OF GASTRIC BAND;  Surgeon: Valarie Merino, MD;  Location: [facility];  Service: General;  Laterality: N/A;  removal of old lap. band port, replaced with AP standard band   LAPAROSCOPY  09/24/2011   Procedure: LAPAROSCOPY DIAGNOSTIC;  Surgeon: Valarie Merino, MD;  Location: [facility];  Service: General;  Laterality: N/A;   LAPAROSCOPY  07/03/2012   Procedure: LAPAROSCOPY DIAGNOSTIC;  Surgeon: Valarie Merino, MD;  Location: [facility];  Service: General;  Laterality: N/A;  Exploratory Laparoscopy    MESH APPLIED TO LAP PORT  07/03/2012   Procedure: MESH APPLIED TO LAP PORT;  Surgeon: Valarie Merino, MD;  Location: [facility];  Service: General;  Laterality: N/A;   NASAL SINUS SURGERY  1995 & 1997   X 2   right knee  1981   ARTHROSCOPY AND ARTHROTOMY   right knee arthroscopy and arthrotomy  12-1979   TONSILLECTOMY  1971   TUBAL LIGATION  1993   WITH C -SECTION   Social History   Tobacco Use   Smoking status: Never   Smokeless tobacco: Never  Vaping Use   Vaping status: Never Used  Substance Use Topics   Alcohol use: Not Currently   Drug use: Never   Social History   Socioeconomic History   Marital status: Married    Spouse name: Arceli Chila   Number of children: 2   Years of education: Not on file   Highest education level: Bachelor's degree (e.g., BA, AB, BS)  Occupational History   Occupation: Teacher, adult education: [employer]  Tobacco Use   Smoking status: Never   Smokeless tobacco: Never  Vaping Use   Vaping status: Never Used  Substance and Sexual Activity   Alcohol use: Not Currently   Drug use: Never   Sexual activity: Yes    Birth control/protection: None  Other Topics Concern   Not on file  Social History Narrative   Right handed   Two story home   Drinks caffeine occasionally   Social  Determinants of Health   Financial Resource Strain: Low Risk  (04/22/2023)   Overall Financial Resource Strain (CARDIA)    Difficulty of Paying Living Expenses: Not hard at all  Food Insecurity: No Food Insecurity (04/22/2023)   Hunger Vital Sign    Worried About Running Out of Food in the Last Year: Never true    Ran Out of Food in the Last Year: Never true  Transportation Needs: No Transportation Needs (04/22/2023)   PRAPARE - Administrator, Civil Service (Medical): No    Lack of Transportation (Non-Medical): No  Physical Activity: Insufficiently Active (04/22/2023)   Exercise Vital Sign    Days of Exercise per Week:  2 days    Minutes of Exercise per Session: 10 min  Stress: No Stress Concern Present (04/22/2023)   [employer] of Occupational Health - Occupational Stress Questionnaire    Feeling of Stress : Not at all  Social Connections: Unknown (04/22/2023)   Social Connection and Isolation Panel [NHANES]    Frequency of Communication with Friends and Family: More than three times a week    Frequency of Social Gatherings with Friends and Family: Twice a week    Attends Religious Services: Patient declined    Database administrator or Organizations: No    Attends Engineer, structural: Not on file    Marital Status: Married  Catering manager Violence: Not At Risk (09/15/2022)   Received from [employer], [facility]   HITS    Over the last 12 months how often did your partner physically hurt you?: 1    Over the last 12 months how often did your partner insult you or talk down to you?: 1    Over the last 12 months how often did your partner threaten you with physical harm?: 1    Over the last 12 months how often did your partner scream or curse at you?: 1   Family Status  Relation Name Status   Mother  Deceased   Father  Deceased at age 43   Other  (Not Specified)   Other  (Not Specified)  No partnership data on file   Family History  Problem  Relation Age of Onset   Diabetes Mother    Stroke Mother    Breast cancer Mother    Atrial fibrillation Mother    Hypertension Mother    Cancer Mother 23       breast   Diabetes Father    Stroke Father 34       2nd 6 month apart   Hypertension Father    Heart attack Father 109       stents   Dementia Father    AAA (abdominal aortic aneurysm) Father 57       repair   CAD Father 84   Thyroid disease Other    Heart disease Other    COPD Other    Allergies  Allergen Reactions   Gabapentin Swelling    Pt states she has eye swelling    Isoniazid     Severe SOB   Morphine Hives and Nausea And Vomiting    Allergic to PCA pump only    Nitrofurantoin Shortness Of Breath, Rash and Other (See Comments)    REACTION: welts   Tamiflu [Oseltamivir Phosphate] Nausea And Vomiting    "I vomited within 30 minutes of taking it and was told I cannot ever take it again."   Hctz [Hydrochlorothiazide] Rash   Macrolides And Ketolides Rash   Promethazine Other (See Comments)    Other Reaction(s): Psychosis  IF GIVEN  IV-hallucinations     CAN TAKE PO PHENERGAN, , Other reaction(s): Hallucinations, IF GIVEN  IV-hallucinations     CAN TAKE PO PHENERGAN, Other reaction(s): Unknown   Wound Dressing Adhesive Rash    Adhesive Tape    Fentanyl Hives, Itching and Rash    REACTION: welts   Sulfamethoxazole-Trimethoprim Rash      Review of Systems  Constitutional:  Negative for chills, fever and malaise/fatigue.  HENT:  Negative for congestion and hearing loss.   Eyes:  Negative for blurred vision and discharge.  Respiratory:  Negative for cough, sputum production and  shortness of breath.   Cardiovascular:  Negative for chest pain, palpitations and leg swelling.  Gastrointestinal:  Negative for abdominal pain, blood in stool, constipation, diarrhea, heartburn, nausea and vomiting.  Genitourinary:  Negative for dysuria, frequency, hematuria and urgency.  Musculoskeletal:  Negative for back pain,  falls and myalgias.  Skin:  Negative for rash.  Neurological:  Negative for dizziness, sensory change, loss of consciousness, weakness and headaches.  Endo/Heme/Allergies:  Negative for environmental allergies. Does not bruise/bleed easily.  Psychiatric/Behavioral:  Negative for depression and suicidal ideas. The patient is not nervous/anxious and does not have insomnia.       Objective:     BP 122/78 (BP Location: Left Arm, Patient Position: Sitting, Cuff Size: Large)   Pulse 70   Temp 97.7 F (36.5 C) (Oral)   Resp 18   Ht 5' 9.5" (1.765 m)   Wt 196 lb (88.9 kg)   SpO2 98%   BMI 28.53 kg/m  BP Readings from Last 3 Encounters:  04/22/23 122/78  03/25/23 119/70  03/15/23 128/78   Wt Readings from Last 3 Encounters:  04/22/23 196 lb (88.9 kg)  03/15/23 195 lb (88.5 kg)  03/15/23 195 lb (88.5 kg)   SpO2 Readings from Last 3 Encounters:  04/22/23 98%  03/15/23 100%  03/15/23 98%      Physical Exam Vitals and nursing note reviewed.  Constitutional:      General: She is not in acute distress.    Appearance: Normal appearance. She is well-developed.  HENT:     Head: Normocephalic and atraumatic.     Right Ear: Tympanic membrane, ear canal and external ear normal. There is no impacted cerumen.     Left Ear: Tympanic membrane, ear canal and external ear normal. There is no impacted cerumen.     Nose: Nose normal.     Mouth/Throat:     Mouth: Mucous membranes are moist.     Pharynx: Oropharynx is clear. No oropharyngeal exudate or posterior oropharyngeal erythema.  Eyes:     General: No scleral icterus.       Right eye: No discharge.        Left eye: No discharge.     Conjunctiva/sclera: Conjunctivae normal.     Pupils: Pupils are equal, round, and reactive to light.  Neck:     Thyroid: No thyromegaly or thyroid tenderness.     Vascular: No JVD.  Cardiovascular:     Rate and Rhythm: Normal rate and regular rhythm.     Heart sounds: Normal heart sounds. No murmur  heard. Pulmonary:     Effort: Pulmonary effort is normal. No respiratory distress.     Breath sounds: Normal breath sounds.  Abdominal:     General: Bowel sounds are normal. There is no distension.     Palpations: Abdomen is soft. There is no mass.     Tenderness: There is no abdominal tenderness. There is no guarding or rebound.  Genitourinary:    Vagina: Normal.  Musculoskeletal:        General: Normal range of motion.     Cervical back: Normal range of motion and neck supple.     Right lower leg: No edema.     Left lower leg: No edema.  Lymphadenopathy:     Cervical: No cervical adenopathy.  Skin:    General: Skin is warm and dry.     Findings: No erythema or rash.  Neurological:     Mental Status: She is alert and oriented to  person, place, and time.     Cranial Nerves: No cranial nerve deficit.     Deep Tendon Reflexes: Reflexes are normal and symmetric.  Psychiatric:        Mood and Affect: Mood normal.        Behavior: Behavior normal.        Thought Content: Thought content normal.        Judgment: Judgment normal.      No results found for any visits on 04/22/23.  Last CBC Lab Results  Component Value Date   WBC 5.5 02/04/2023   HGB 12.7 02/04/2023   HCT 39.7 02/04/2023   MCV 94.7 02/04/2023   MCH 30.3 02/04/2023   RDW 12.7 02/04/2023   PLT 141 (L) 02/04/2023   Last metabolic panel Lab Results  Component Value Date   GLUCOSE 81 10/22/2022   NA 141 12/10/2022   K 4.1 12/10/2022   CL 101 12/10/2022   CO2 24 (A) 12/10/2022   BUN 9 12/10/2022   CREATININE 0.9 12/10/2022   EGFR 73 12/10/2022   CALCIUM 9.4 12/10/2022   PROT 7.0 10/22/2022   ALBUMIN 4.4 12/10/2022   BILITOT 0.7 10/22/2022   ALKPHOS 72 12/10/2022   AST 35 12/10/2022   ALT 39 (A) 12/10/2022   ANIONGAP 7 06/09/2021   Last lipids Lab Results  Component Value Date   CHOL 127 10/22/2022   HDL 69.10 10/22/2022   LDLCALC 44 10/22/2022   TRIG 66.0 10/22/2022   CHOLHDL 2 10/22/2022    Last hemoglobin A1c Lab Results  Component Value Date   HGBA1C 5.5 10/22/2022   Last thyroid functions Lab Results  Component Value Date   TSH 2.02 10/22/2022   Last vitamin D Lab Results  Component Value Date   VD25OH 39.63 10/22/2022   Last vitamin B12 and Folate Lab Results  Component Value Date   VITAMINB12 493 10/22/2022   FOLATE 18.1 05/11/2021      The ASCVD Risk score (Arnett DK, et al., 2019) failed to calculate for the following reasons:   The valid total cholesterol range is 130 to 320 mg/dL    Assessment & Plan:   Problem List Items Addressed This Visit       Unprioritized   MORBID OBESITY   Relevant Medications   tirzepatide (MOUNJARO) 5 MG/0.5ML Pen   GERD (gastroesophageal reflux disease)   Relevant Medications   ondansetron (ZOFRAN) 4 MG tablet   Thrombocytopenia (HCC)   Tinea corporis   Diabetic ulcer of toe of right foot associated with type 2 diabetes mellitus, limited to breakdown of skin (HCC)   Relevant Medications   tirzepatide (MOUNJARO) 5 MG/0.5ML Pen   rosuvastatin (CRESTOR) 20 MG tablet   olmesartan (BENICAR) 20 MG tablet   Stenosis of left subclavian artery (HCC)   Relevant Medications   rosuvastatin (CRESTOR) 20 MG tablet   olmesartan (BENICAR) 20 MG tablet   Depression with anxiety   Relevant Medications   DULoxetine (CYMBALTA) 30 MG capsule   UNSPECIFIED VITAMIN D DEFICIENCY   Relevant Orders   VITAMIN D 25 Hydroxy (Vit-D Deficiency, Fractures)   Rheumatoid arthritis involving multiple sites with positive rheumatoid factor (HCC)    Per rheum      Relevant Medications   tirzepatide (MOUNJARO) 5 MG/0.5ML Pen   Primary hypertension    Well controlled, no changes to meds. Encouraged heart healthy diet such as the DASH diet and exercise as tolerated.        Relevant Medications   rosuvastatin (  CRESTOR) 20 MG tablet   olmesartan (BENICAR) 20 MG tablet   Hyperlipidemia associated with type 2 diabetes mellitus (HCC)  (Chronic)    Tolerating statin, encouraged heart healthy diet, avoid trans fats, minimize simple carbs and saturated fats. Increase exercise as tolerated       Relevant Medications   tirzepatide (MOUNJARO) 5 MG/0.5ML Pen   rosuvastatin (CRESTOR) 20 MG tablet   olmesartan (BENICAR) 20 MG tablet   Controlled type 2 diabetes mellitus with hyperglycemia, without long-term current use of insulin (HCC)    hgba1c to be checked, minimize simple carbs. Increase exercise as tolerated. Continue current meds       Relevant Medications   tirzepatide (MOUNJARO) 5 MG/0.5ML Pen   rosuvastatin (CRESTOR) 20 MG tablet   olmesartan (BENICAR) 20 MG tablet   Other Visit Diagnoses     Essential hypertension    -  Primary   Relevant Medications   rosuvastatin (CRESTOR) 20 MG tablet   potassium chloride (KLOR-CON M) 10 MEQ tablet   olmesartan (BENICAR) 20 MG tablet   Other Relevant Orders   CBC with Differential/Platelet   Comprehensive metabolic panel   Elevated liver enzymes       Relevant Orders   Comprehensive metabolic panel   Hyperlipidemia, unspecified hyperlipidemia type       Relevant Medications   tirzepatide (MOUNJARO) 5 MG/0.5ML Pen   rosuvastatin (CRESTOR) 20 MG tablet   olmesartan (BENICAR) 20 MG tablet   Other Relevant Orders   Lipid panel   Comprehensive metabolic panel   Controlled type 2 diabetes mellitus with complication, without long-term current use of insulin (HCC)       Relevant Medications   tirzepatide (MOUNJARO) 5 MG/0.5ML Pen   rosuvastatin (CRESTOR) 20 MG tablet   olmesartan (BENICAR) 20 MG tablet   Other Relevant Orders   Hemoglobin A1c   Microalbumin / creatinine urine ratio   Vitamin B12 deficiency       Relevant Orders   Vitamin B12   Type 2 diabetes mellitus with hyperglycemia, without long-term current use of insulin (HCC)       Relevant Medications   tirzepatide (MOUNJARO) 5 MG/0.5ML Pen   rosuvastatin (CRESTOR) 20 MG tablet   olmesartan (BENICAR) 20 MG  tablet   Nausea       Relevant Medications   ondansetron (ZOFRAN) 4 MG tablet   Other insomnia       Relevant Medications   temazepam (RESTORIL) 30 MG capsule   Vaginal discharge       Relevant Orders   Cervicovaginal ancillary only( [place])     Assessment and Plan    Diabetes Blood sugars in the 80s and 90s. Patient is currently on Ozempic and considering switching to [facility] (Trulicity) for additional weight loss benefits. -Continue Ozempic until finished. -Start Mounjaro (Trulicity) at 5mg . -Provide prescription for Ondansetron for nausea associated with Ozempic.  Rheumatoid Arthritis Increased pain and difficulty with mobility. Recent switch to Simponi due to elevated liver enzymes, but patient reports it is not effective. -Continue Simponi until next rheumatology appointment. -Plan to discuss efficacy and potential dosage adjustments with rheumatologist.  Fluid Retention Patient reports visible fluid retention. -Continue current management and monitor.  Medication Refills Patient requires refills for Duloxetine, Rosuvastatin, Pantoprazole, Olmesartan, Potassium, and Temazepam. -Provide necessary refills.  General Health Maintenance -Completed flu shot and COVID vaccination. -Scheduled for eye doctor appointment at the end of the month. -Plan to complete microalbumin test. -Six-month physical scheduled for next year.  No follow-ups on file.    Donato Schultz, DO

## 2023-04-22 NOTE — Assessment & Plan Note (Signed)
Check b12. 

## 2023-04-25 ENCOUNTER — Inpatient Hospital Stay: Payer: Commercial Managed Care - PPO | Admitting: Hematology & Oncology

## 2023-04-25 ENCOUNTER — Encounter: Payer: Self-pay | Admitting: Hematology & Oncology

## 2023-04-25 ENCOUNTER — Inpatient Hospital Stay: Payer: Commercial Managed Care - PPO | Attending: Hematology & Oncology

## 2023-04-25 VITALS — BP 118/61 | HR 72 | Temp 99.1°F | Resp 18 | Wt 197.0 lb

## 2023-04-25 DIAGNOSIS — K912 Postsurgical malabsorption, not elsewhere classified: Secondary | ICD-10-CM | POA: Insufficient documentation

## 2023-04-25 DIAGNOSIS — M069 Rheumatoid arthritis, unspecified: Secondary | ICD-10-CM | POA: Insufficient documentation

## 2023-04-25 DIAGNOSIS — E785 Hyperlipidemia, unspecified: Secondary | ICD-10-CM

## 2023-04-25 DIAGNOSIS — E1140 Type 2 diabetes mellitus with diabetic neuropathy, unspecified: Secondary | ICD-10-CM

## 2023-04-25 DIAGNOSIS — D508 Other iron deficiency anemias: Secondary | ICD-10-CM | POA: Diagnosis not present

## 2023-04-25 DIAGNOSIS — R0989 Other specified symptoms and signs involving the circulatory and respiratory systems: Secondary | ICD-10-CM

## 2023-04-25 DIAGNOSIS — E1169 Type 2 diabetes mellitus with other specified complication: Secondary | ICD-10-CM | POA: Diagnosis not present

## 2023-04-25 DIAGNOSIS — D500 Iron deficiency anemia secondary to blood loss (chronic): Secondary | ICD-10-CM

## 2023-04-25 DIAGNOSIS — M0579 Rheumatoid arthritis with rheumatoid factor of multiple sites without organ or systems involvement: Secondary | ICD-10-CM

## 2023-04-25 DIAGNOSIS — Z9884 Bariatric surgery status: Secondary | ICD-10-CM | POA: Diagnosis not present

## 2023-04-25 LAB — IRON AND IRON BINDING CAPACITY (CC-WL,HP ONLY)
Iron: 71 ug/dL (ref 28–170)
Saturation Ratios: 21 % (ref 10.4–31.8)
TIBC: 344 ug/dL (ref 250–450)
UIBC: 273 ug/dL (ref 148–442)

## 2023-04-25 LAB — CMP (CANCER CENTER ONLY)
ALT: 32 U/L (ref 0–44)
AST: 37 U/L (ref 15–41)
Albumin: 3.9 g/dL (ref 3.5–5.0)
Alkaline Phosphatase: 70 U/L (ref 38–126)
Anion gap: 7 (ref 5–15)
BUN: 7 mg/dL — ABNORMAL LOW (ref 8–23)
CO2: 34 mmol/L — ABNORMAL HIGH (ref 22–32)
Calcium: 9.2 mg/dL (ref 8.9–10.3)
Chloride: 104 mmol/L (ref 98–111)
Creatinine: 0.82 mg/dL (ref 0.44–1.00)
GFR, Estimated: >60 mL/min (ref >60)
Glucose, Bld: 86 mg/dL (ref 70–99)
Potassium: 4.6 mmol/L (ref 3.5–5.1)
Sodium: 145 mmol/L (ref 135–145)
Total Bilirubin: 0.6 mg/dL (ref 0.3–1.2)
Total Protein: 6.6 g/dL (ref 6.5–8.1)

## 2023-04-25 LAB — CBC WITH DIFFERENTIAL (CANCER CENTER ONLY)
Abs Immature Granulocytes: 0.01 10*3/uL (ref 0.00–0.07)
Basophils Absolute: 0 10*3/uL (ref 0.0–0.1)
Basophils Relative: 1 %
Eosinophils Absolute: 0.1 10*3/uL (ref 0.0–0.5)
Eosinophils Relative: 2 %
HCT: 40.8 % (ref 36.0–46.0)
Hemoglobin: 13.1 g/dL (ref 12.0–15.0)
Immature Granulocytes: 0 %
Lymphocytes Relative: 29 %
Lymphs Abs: 1.5 10*3/uL (ref 0.7–4.0)
MCH: 30.1 pg (ref 26.0–34.0)
MCHC: 32.1 g/dL (ref 30.0–36.0)
MCV: 93.8 fL (ref 80.0–100.0)
Monocytes Absolute: 0.7 10*3/uL (ref 0.1–1.0)
Monocytes Relative: 13 %
Neutro Abs: 2.9 10*3/uL (ref 1.7–7.7)
Neutrophils Relative %: 55 %
Platelet Count: 168 10*3/uL (ref 150–400)
RBC: 4.35 MIL/uL (ref 3.87–5.11)
RDW: 12 % (ref 11.5–15.5)
WBC Count: 5.3 10*3/uL (ref 4.0–10.5)
nRBC: 0 % (ref 0.0–0.2)

## 2023-04-25 LAB — FERRITIN: Ferritin: 86 ng/mL (ref 11–307)

## 2023-04-25 LAB — CERVICOVAGINAL ANCILLARY ONLY
Bacterial Vaginitis (gardnerella): NEGATIVE
Candida Glabrata: NEGATIVE
Candida Vaginitis: POSITIVE — AB
Chlamydia: NEGATIVE
Comment: NEGATIVE
Comment: NEGATIVE
Comment: NEGATIVE
Comment: NEGATIVE
Comment: NEGATIVE
Comment: NORMAL
Neisseria Gonorrhea: NEGATIVE
Trichomonas: NEGATIVE

## 2023-04-25 LAB — RETICULOCYTES
Immature Retic Fract: 11.6 % (ref 2.3–15.9)
RBC.: 4.31 MIL/uL (ref 3.87–5.11)
Retic Count, Absolute: 57.8 10*3/uL (ref 19.0–186.0)
Retic Ct Pct: 1.3 % (ref 0.4–3.1)

## 2023-04-25 NOTE — Progress Notes (Signed)
Hematology and Oncology Follow Up Visit  [name] [id number] 10-Mar-1956 67 y.o. 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  She is still having problems with her rheumatoid arthritis.  She had a different biologic that she is taking.  She is taking  Symphoni.  This is not appear to be working.  She does have a lot of pain.  She is uses some Voltaren cream for the pain.  She continues on the IV Plaquenil.  She is also can be started on Mounjaro.  She is on Ozempic but this does not appear to be at that effective for losing weight.    At least, her iron studies have been okay.  When we  last saw her back in July, her ferritin was 91.  Her iron saturation is 31%.  She has had no bleeding.  There is no change in bowel or bladder habits.  She has had no rashes.  She has had no leg swelling.  She has had no bleeding.  Thankfully, there has been no problems with COVID.  Overall, I would say performance status is probably ECOG 1.     Medications:  Allergies as of 04/25/2023       Reactions   Gabapentin Swelling   Pt states she has eye swelling    Isoniazid    Severe SOB   Morphine Hives, Nausea And Vomiting   Allergic to PCA pump only   Nitrofurantoin Shortness Of Breath, Rash, Other (See Comments)   REACTION: welts   Tamiflu [oseltamivir Phosphate] Nausea And Vomiting   "I vomited within 30 minutes of taking it and was told I cannot ever take it again."   Hctz [hydrochlorothiazide] Rash   Macrolides And Ketolides Rash   Promethazine Other (See Comments)   Other Reaction(s): Psychosis IF GIVEN  IV-hallucinations     CAN TAKE PO PHENERGAN, , Other reaction(s): Hallucinations, IF GIVEN  IV-hallucinations     CAN TAKE PO PHENERGAN, Other reaction(s): Unknown   Wound Dressing Adhesive Rash    Adhesive Tape    Fentanyl Hives, Itching, Rash   REACTION: welts   Sulfamethoxazole-trimethoprim Rash        Medication List        Accurate as of April 25, 2023  9:23 AM. If you have any questions, ask your nurse or doctor.          acetaminophen 650 MG CR tablet Commonly known as: TYLENOL Take by mouth.   Actemra 80 MG/4ML Soln injection Generic drug: Tocilizumab Inject into the vein.   Centrum Adults Tabs take 2 tablets by oral route  every day with food   Cholecalciferol 125 MCG (5000 UT) Tabs take 1 tablet by oral route every day   Cyanocobalamin 3000 MCG/ML Liqd as directed Sublingual   cyclobenzaprine 10 MG tablet Commonly known as: FLEXERIL Take 1 tablet (10 mg total) by mouth before bedtime for muscle spasms.   cyclobenzaprine 10 MG tablet Commonly known as: FLEXERIL Take 1 tablet (10 mg total) by mouth every 12 hours as needed   DerMend Bruise Formula Crea as directed Externally   diclofenac Sodium 1 % Gel Commonly known as: VOLTAREN Apply 4 g topically 4 (four) times daily.   Dotti 0.1 MG/24HR patch Generic drug: estradiol Apply 1  patch twice a week by transdermal route.   DULoxetine 30 MG capsule Commonly known as: CYMBALTA Take 1 capsule (30 mg total) by mouth 2 (two) times daily.   Emgality 120 MG/ML Soaj Generic drug: Galcanezumab-gnlm INJECT 120 MG INTO THE SKIN EVERY 28 (TWENTY-EIGHT) DAYS.   furosemide 20 MG tablet Commonly known as: LASIX Take 1 tablet (20 mg total) by mouth every morning.   glucose blood test strip Test blood sugar once daily. Dx Code: E11.9   hydrALAZINE 25 MG tablet Commonly known as: APRESOLINE Take 1 tablet (25 mg total) by mouth 2 (two) times daily. Hold if feeling dizzy or SBP<110 mmHg).  Can use additional 25 mg as needed for SBP>150 mmHg   hydroxychloroquine 200 MG tablet Commonly known as: PLAQUENIL Take 2 tablets (400 mg total) by mouth daily.   Jublia 10 % Soln Generic drug:  Efinaconazole APPLY TO AFFECTED TOENAILS ONCE DAILY FOR 48 WEEKS   methocarbamol 500 MG tablet Commonly known as: ROBAXIN Take 1 tablet (500 mg total) by mouth 2 (two) times daily as needed.   methocarbamol 750 MG tablet Commonly known as: ROBAXIN Take 1 tablet (750 mg total) by mouth every 8 (eight) hours.   Monoferric 1000 MG/10ML Soln injection Generic drug: ferric derisomaltose as directed Intravenous   Mounjaro 5 MG/0.5ML Pen Generic drug: tirzepatide Inject 5 mg into the skin once a week.   mupirocin ointment 2 % Commonly known as: BACTROBAN Apply to toes as needed   nystatin powder Commonly known as: MYCOSTATIN/NYSTOP Apply 1 Application topically 3 (three) times daily.   ofloxacin 0.3 % OTIC solution Commonly known as: FLOXIN Place 10 drops into both ears daily.   olmesartan 20 MG tablet Commonly known as: BENICAR Take 1 tablet (20 mg total) by mouth daily with supper.   ondansetron 4 MG tablet Commonly known as: Zofran Take 1 tablet (4 mg total) by mouth every 8 (eight) hours as needed for nausea or vomiting.   Oscimin 0.125 MG SL tablet Generic drug: hyoscyamine Place 1 tablet under the tongue every 6 (six) hours as needed.   pantoprazole 40 MG tablet Commonly known as: PROTONIX Take 1 tablet (40 mg total) by mouth daily.   potassium chloride 10 MEQ tablet Commonly known as: KLOR-CON M Take 1 tablet (10 mEq total) by mouth daily.   predniSONE 5 MG tablet Commonly known as: DELTASONE Take 6 tablets (30 mg total) by mouth daily for 3 days, THEN 5 tablets (25 mg total) daily for 3 days, THEN 4 tablets (20 mg total) daily for 3 days, THEN 3 tablets (15 mg total) daily for 3 days, THEN 2 tablets (10 mg total) daily for 3 days, THEN 1 tablet (5 mg total) daily for 3 days. Start taking on: April 20, 2023   pregabalin 150 MG capsule Commonly known as: LYRICA Take 1 capsule (150 mg total) by mouth 3 (three) times daily. Max Daily Amount: 450 mg.    pregabalin 150 MG capsule Commonly known as: LYRICA Take 1 capsule (150 mg total) by mouth 3 (three) times daily. Max Daily Amount: 450 mg.   Premarin vaginal cream Generic drug: conjugated estrogens Place 0.5 Applicatorfuls vaginally 3 (three) times a week.   rosuvastatin 20 MG tablet Commonly known as: CRESTOR Take 1 tablet (20 mg total) by mouth daily.   temazepam 30 MG capsule Commonly known as: RESTORIL Take 1 capsule (30 mg total) by mouth at bedtime as needed for sleep.   thiamine 100 MG tablet Commonly known  as: Vitamin B-1 Take 100 mg daily by mouth.   triamcinolone ointment 0.1 % Commonly known as: KENALOG Apply 1 Application (a thin layer) topically in the morning and at bedtime.   Ubrelvy 100 MG Tabs Generic drug: Ubrogepant TAKE ONE TABLET BY MOUTH DAILY AS NEEDED FOR MIGRAINE. MAX 2 TABLETS IN 24 HOURS        Allergies:  Allergies  Allergen Reactions   Gabapentin Swelling    Pt states she has eye swelling    Isoniazid     Severe SOB   Morphine Hives and Nausea And Vomiting    Allergic to PCA pump only    Nitrofurantoin Shortness Of Breath, Rash and Other (See Comments)    REACTION: welts   Tamiflu [Oseltamivir Phosphate] Nausea And Vomiting    "I vomited within 30 minutes of taking it and was told I cannot ever take it again."   Hctz [Hydrochlorothiazide] Rash   Macrolides And Ketolides Rash   Promethazine Other (See Comments)    Other Reaction(s): Psychosis  IF GIVEN  IV-hallucinations     CAN TAKE PO PHENERGAN, , Other reaction(s): Hallucinations, IF GIVEN  IV-hallucinations     CAN TAKE PO PHENERGAN, Other reaction(s): Unknown   Wound Dressing Adhesive Rash    Adhesive Tape    Fentanyl Hives, Itching and Rash    REACTION: welts   Sulfamethoxazole-Trimethoprim Rash    Past Medical History, Surgical history, Social history, and Family History were reviewed and updated.  Review of Systems: Review of Systems  Constitutional:  Positive for  malaise/fatigue.  HENT: Negative.    Eyes: Negative.   Respiratory: Negative.    Cardiovascular: Negative.   Gastrointestinal: Negative.   Genitourinary: Negative.   Musculoskeletal:  Positive for joint pain and myalgias.  Skin: Negative.   Neurological: Negative.   Endo/Heme/Allergies: Negative.   Psychiatric/Behavioral: Negative.       Physical Exam: Vital signs show temperature of 99.1.  Pulse 72.  Blood pressure 118/61.  Weight is 197 pounds.    Wt Readings from Last 3 Encounters:  04/25/23 197 lb (89.4 kg)  04/22/23 196 lb (88.9 kg)  03/15/23 195 lb (88.5 kg)    Physical Exam Vitals reviewed.  HENT:     Head: Normocephalic and atraumatic.  Eyes:     Pupils: Pupils are equal, round, and reactive to light.  Cardiovascular:     Rate and Rhythm: Normal rate and regular rhythm.     Heart sounds: Normal heart sounds.  Pulmonary:     Effort: Pulmonary effort is normal.     Breath sounds: Normal breath sounds.  Abdominal:     General: Bowel sounds are normal.     Palpations: Abdomen is soft.  Musculoskeletal:        General: No tenderness or deformity. Normal range of motion.     Cervical back: Normal range of motion.  Lymphadenopathy:     Cervical: No cervical adenopathy.  Skin:    General: Skin is warm and dry.     Findings: No erythema or rash.  Neurological:     Mental Status: She is alert and oriented to person, place, and time.  Psychiatric:        Behavior: Behavior normal.        Thought Content: Thought content normal.        Judgment: Judgment normal.     Lab Results  Component Value Date   WBC 5.3 04/25/2023   HGB 13.1 04/25/2023   HCT 40.8 04/25/2023  MCV 93.8 04/25/2023   PLT 168 04/25/2023   Lab Results  Component Value Date   FERRITIN 91 02/04/2023   IRON 99 02/04/2023   TIBC 323 02/04/2023   UIBC 224 02/04/2023   IRONPCTSAT 31 02/04/2023   Lab Results  Component Value Date   RETICCTPCT 1.3 04/25/2023   RBC 4.35 04/25/2023   RBC  4.31 04/25/2023   No results found for: "KPAFRELGTCHN", "LAMBDASER", "KAPLAMBRATIO" No results found for: "IGGSERUM", "IGA", "IGMSERUM" No results found for: "TOTALPROTELP", "ALBUMINELP", "A1GS", "A2GS", "BETS", "BETA2SER", "GAMS", "MSPIKE", "SPEI"   Chemistry      Component Value Date/Time   NA 145 04/25/2023 0828   NA 141 12/10/2022 0000   K 4.6 04/25/2023 0828   CL 104 04/25/2023 0828   CO2 34 (H) 04/25/2023 0828   BUN 7 (L) 04/25/2023 0828   BUN 9 12/10/2022 0000   CREATININE 0.82 04/25/2023 0828   GLU 88 12/10/2022 0000      Component Value Date/Time   CALCIUM 9.2 04/25/2023 0828   ALKPHOS 70 04/25/2023 0828   AST 37 04/25/2023 0828   ALT 32 04/25/2023 0828   BILITOT 0.6 04/25/2023 0828       Impression and Plan: Ms. Sparacio is a very pleasant 67 yo caucasian female with anemia secondary to malabsorption s/p gastric bypass in 2018.   I will leave that iron studies should be okay.  Biggest problem is the rheumatoid arthritis.  We will see what her iron studies look like.  I will plan to get her back to see me in another 4 months.  We will get her through the Holiday season and hopefully most of Winter.    Josph Macho, MD 10/14/20249:23 AM

## 2023-04-26 ENCOUNTER — Encounter: Payer: Self-pay | Admitting: *Deleted

## 2023-04-27 ENCOUNTER — Telehealth: Payer: Self-pay | Admitting: Family Medicine

## 2023-04-27 ENCOUNTER — Other Ambulatory Visit (HOSPITAL_BASED_OUTPATIENT_CLINIC_OR_DEPARTMENT_OTHER): Payer: Self-pay

## 2023-04-27 MED ORDER — FLUCONAZOLE 150 MG PO TABS
150.0000 mg | ORAL_TABLET | Freq: Every day | ORAL | 0 refills | Status: DC
  Filled 2023-04-27: qty 2, 2d supply, fill #0

## 2023-04-27 NOTE — Telephone Encounter (Signed)
Rx sent per lab results

## 2023-04-27 NOTE — Addendum Note (Signed)
Addended by: Roxanne Gates on: 04/27/2023 01:45 PM   Modules accepted: Orders

## 2023-04-27 NOTE — Telephone Encounter (Signed)
Pt called stating that she was supposed to have started diflucan per her last labs but her pharmacy did not have it available. After reviewing chart, noted that the medication had not been sent in yet. Advised a HP message would be sent in to see about sending this in for her to the following pharmacy:  [facility] [place] [facility] [address] [place] [address] Phone: [phone]  Fax: [phone]  Routed HP due to internal error.

## 2023-04-27 NOTE — Progress Notes (Signed)
Subjective:  Patient ID: [name], female    DOB: March 17, 1956,  MRN: [id number]  No chief complaint on file.   Discussed the use of AI scribe software for clinical note transcription with the patient, who gave verbal consent to proceed.  History of Present Illness          67 year old female with a history of rheumatoid arthritis and osteoarthritis, presents with persistent pain in the right foot following stress fractures of the first three metatarsals in September. The fractures were diagnosed via MRI. The patient was subsequently placed in a boot for several weeks. She also reports a recent fracture of the right fifth toe, which remains swollen and occasionally painful. The patient notes that the second toe on the right foot was not aligned correctly following the fractures, leading to a space between the toes. She also reports ongoing pain in this toe. The patient has noticed that her foot remains swollen compared to the left foot. She also reports end-stage osteoarthritis in the knees and hips, and recent changes to her rheumatoid arthritis medication due to elevated liver function tests. The new medication, symphony, is reportedly not effective, leading to excruciating joint pain and disrupted sleep.   Objective:    Physical Exam         General: AAO x3, NAD  Dermatological: Mild edema and inflammation of the right fifth toe.  No open lesions.  Vascular: Dorsalis Pedis artery and Posterior Tibial artery pedal pulses are 2/4 bilateral with immedate capillary fill time. There is no pain with calf compression, swelling, warmth, erythema.   Neruologic: Grossly intact via light touch bilateral.  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 The second toe on the right foot is semi-rigid, progressing towards rigidity. The fourth toe on the right foot is becoming rigid. The third toe on the right foot and toes on the left foot are flexible. Bunion present on the right foot.  No  area pinpoint tenderness.   Gait: Unassisted, Nonantalgic.    No images are attached to the encounter.    Results          Assessment:  No diagnosis found.   Plan:  Patient was evaluated and treated and all questions answered.  Assessment and Plan           Stress Fractures of the Right Foot History of stress fractures of the first three metatarsals in September. Persistent pain in the second toe. X-ray shows the third metatarsal bone had been fractured and shortened, causing increased weight bearing on the second toe. -Provided toe crests to alleviate pressure on toes. -Recommended exercises to maintain toe flexibility. -Advised to continue wearing supportive shoes. -Discussed surgical invention future if needed. -Closed fracture of right fifth toe offloading, stiff soled shoe.  Hammer Toes Semi-rigid to rigid state of the second and fourth toes on the right foot, and flexible state of the third toe. The second toe on the left foot is in a semi-rigid state. -Provided toe crests to alleviate pressure on toes. -Recommended exercises to maintain toe flexibility. -Advised to continue wearing supportive shoes. -Surgery to straighten toes is an option  Rheumatoid Arthritis Current medication (Symphony), causing nocturnal joint pain and requiring frequent use of Flexeril, Voltaren gel, and Robaxin. -Continue current medication until next appointment with rheumatologist. -Consider discussing alternative medication options with rheumatologist.   Radiology: Reviewed the x-rays of the right foot from March 25, 2023 and reviewed this with the patient.   No follow-ups on file.  Vivi Barrack DPM

## 2023-05-02 ENCOUNTER — Other Ambulatory Visit (HOSPITAL_BASED_OUTPATIENT_CLINIC_OR_DEPARTMENT_OTHER): Payer: Self-pay

## 2023-05-02 ENCOUNTER — Encounter: Payer: Self-pay | Admitting: Plastic Surgery

## 2023-05-03 ENCOUNTER — Other Ambulatory Visit (HOSPITAL_BASED_OUTPATIENT_CLINIC_OR_DEPARTMENT_OTHER): Payer: Self-pay

## 2023-05-05 ENCOUNTER — Other Ambulatory Visit: Payer: Self-pay

## 2023-05-05 ENCOUNTER — Other Ambulatory Visit (HOSPITAL_BASED_OUTPATIENT_CLINIC_OR_DEPARTMENT_OTHER): Payer: Self-pay

## 2023-05-05 DIAGNOSIS — M79643 Pain in unspecified hand: Secondary | ICD-10-CM | POA: Diagnosis not present

## 2023-05-05 DIAGNOSIS — G8929 Other chronic pain: Secondary | ICD-10-CM | POA: Diagnosis not present

## 2023-05-05 DIAGNOSIS — M2550 Pain in unspecified joint: Secondary | ICD-10-CM | POA: Diagnosis not present

## 2023-05-05 DIAGNOSIS — M797 Fibromyalgia: Secondary | ICD-10-CM | POA: Diagnosis not present

## 2023-05-05 DIAGNOSIS — Z79899 Other long term (current) drug therapy: Secondary | ICD-10-CM | POA: Diagnosis not present

## 2023-05-05 DIAGNOSIS — M0609 Rheumatoid arthritis without rheumatoid factor, multiple sites: Secondary | ICD-10-CM | POA: Diagnosis not present

## 2023-05-05 DIAGNOSIS — M8580 Other specified disorders of bone density and structure, unspecified site: Secondary | ICD-10-CM | POA: Diagnosis not present

## 2023-05-05 DIAGNOSIS — M1990 Unspecified osteoarthritis, unspecified site: Secondary | ICD-10-CM | POA: Diagnosis not present

## 2023-05-05 DIAGNOSIS — R748 Abnormal levels of other serum enzymes: Secondary | ICD-10-CM | POA: Diagnosis not present

## 2023-05-05 DIAGNOSIS — M549 Dorsalgia, unspecified: Secondary | ICD-10-CM | POA: Diagnosis not present

## 2023-05-05 MED ORDER — HYDROXYCHLOROQUINE SULFATE 200 MG PO TABS
400.0000 mg | ORAL_TABLET | Freq: Every day | ORAL | 0 refills | Status: DC
  Filled 2023-05-05 – 2023-08-02 (×2): qty 180, 90d supply, fill #0

## 2023-05-06 ENCOUNTER — Other Ambulatory Visit (HOSPITAL_BASED_OUTPATIENT_CLINIC_OR_DEPARTMENT_OTHER): Payer: Self-pay

## 2023-05-09 ENCOUNTER — Other Ambulatory Visit (HOSPITAL_BASED_OUTPATIENT_CLINIC_OR_DEPARTMENT_OTHER): Payer: Self-pay

## 2023-05-10 ENCOUNTER — Other Ambulatory Visit: Payer: Self-pay

## 2023-05-10 ENCOUNTER — Other Ambulatory Visit (HOSPITAL_BASED_OUTPATIENT_CLINIC_OR_DEPARTMENT_OTHER): Payer: Self-pay

## 2023-05-11 ENCOUNTER — Other Ambulatory Visit (HOSPITAL_BASED_OUTPATIENT_CLINIC_OR_DEPARTMENT_OTHER): Payer: Self-pay

## 2023-05-11 DIAGNOSIS — H5203 Hypermetropia, bilateral: Secondary | ICD-10-CM | POA: Diagnosis not present

## 2023-05-11 DIAGNOSIS — H524 Presbyopia: Secondary | ICD-10-CM | POA: Diagnosis not present

## 2023-05-11 DIAGNOSIS — E119 Type 2 diabetes mellitus without complications: Secondary | ICD-10-CM | POA: Diagnosis not present

## 2023-05-11 DIAGNOSIS — H25812 Combined forms of age-related cataract, left eye: Secondary | ICD-10-CM | POA: Diagnosis not present

## 2023-05-11 DIAGNOSIS — H52221 Regular astigmatism, right eye: Secondary | ICD-10-CM | POA: Diagnosis not present

## 2023-05-11 DIAGNOSIS — Z79899 Other long term (current) drug therapy: Secondary | ICD-10-CM | POA: Diagnosis not present

## 2023-05-11 DIAGNOSIS — H2511 Age-related nuclear cataract, right eye: Secondary | ICD-10-CM | POA: Diagnosis not present

## 2023-05-11 LAB — HM DIABETES EYE EXAM

## 2023-05-12 ENCOUNTER — Encounter: Payer: Self-pay | Admitting: Family

## 2023-05-12 ENCOUNTER — Other Ambulatory Visit (HOSPITAL_BASED_OUTPATIENT_CLINIC_OR_DEPARTMENT_OTHER): Payer: Self-pay

## 2023-05-12 ENCOUNTER — Other Ambulatory Visit: Payer: Self-pay

## 2023-05-12 DIAGNOSIS — M0609 Rheumatoid arthritis without rheumatoid factor, multiple sites: Secondary | ICD-10-CM | POA: Diagnosis not present

## 2023-05-15 ENCOUNTER — Other Ambulatory Visit: Payer: Self-pay | Admitting: Neurology

## 2023-05-15 DIAGNOSIS — G43009 Migraine without aura, not intractable, without status migrainosus: Secondary | ICD-10-CM

## 2023-05-15 DIAGNOSIS — G43019 Migraine without aura, intractable, without status migrainosus: Secondary | ICD-10-CM

## 2023-05-17 ENCOUNTER — Encounter: Payer: Self-pay | Admitting: Family Medicine

## 2023-05-17 ENCOUNTER — Other Ambulatory Visit (HOSPITAL_BASED_OUTPATIENT_CLINIC_OR_DEPARTMENT_OTHER): Payer: Self-pay

## 2023-05-19 ENCOUNTER — Other Ambulatory Visit: Payer: Self-pay

## 2023-05-19 MED ORDER — FLUCONAZOLE 150 MG PO TABS: 150.0000 mg | ORAL_TABLET | ORAL | 0 refills | Status: DC

## 2023-05-23 ENCOUNTER — Other Ambulatory Visit (HOSPITAL_BASED_OUTPATIENT_CLINIC_OR_DEPARTMENT_OTHER): Payer: Self-pay

## 2023-05-24 ENCOUNTER — Other Ambulatory Visit: Payer: Self-pay

## 2023-05-24 ENCOUNTER — Other Ambulatory Visit (HOSPITAL_BASED_OUTPATIENT_CLINIC_OR_DEPARTMENT_OTHER): Payer: Self-pay

## 2023-05-24 DIAGNOSIS — M4326 Fusion of spine, lumbar region: Secondary | ICD-10-CM | POA: Diagnosis not present

## 2023-05-24 MED ORDER — CYCLOBENZAPRINE HCL 10 MG PO TABS
10.0000 mg | ORAL_TABLET | Freq: Two times a day (BID) | ORAL | 0 refills | Status: DC | PRN
  Filled 2023-05-24: qty 180, 90d supply, fill #0

## 2023-05-24 MED ORDER — TRAMADOL HCL 50 MG PO TABS
50.0000 mg | ORAL_TABLET | Freq: Three times a day (TID) | ORAL | 0 refills | Status: DC | PRN
  Filled 2023-05-24: qty 90, 30d supply, fill #0

## 2023-05-24 MED ORDER — METHOCARBAMOL 750 MG PO TABS
750.0000 mg | ORAL_TABLET | Freq: Three times a day (TID) | ORAL | 0 refills | Status: DC
  Filled 2023-05-24: qty 180, 60d supply, fill #0

## 2023-05-25 ENCOUNTER — Other Ambulatory Visit (HOSPITAL_BASED_OUTPATIENT_CLINIC_OR_DEPARTMENT_OTHER): Payer: Self-pay

## 2023-05-26 ENCOUNTER — Other Ambulatory Visit (HOSPITAL_BASED_OUTPATIENT_CLINIC_OR_DEPARTMENT_OTHER): Payer: Self-pay

## 2023-05-27 ENCOUNTER — Other Ambulatory Visit (HOSPITAL_BASED_OUTPATIENT_CLINIC_OR_DEPARTMENT_OTHER): Payer: Self-pay

## 2023-05-27 ENCOUNTER — Encounter: Payer: Self-pay | Admitting: Family Medicine

## 2023-05-27 ENCOUNTER — Other Ambulatory Visit: Payer: Self-pay

## 2023-05-27 MED ORDER — TIRZEPATIDE 7.5 MG/0.5ML ~~LOC~~ SOAJ
7.5000 mg | SUBCUTANEOUS | 0 refills | Status: DC
  Filled 2023-05-27: qty 6, 84d supply, fill #0
  Filled 2023-06-15: qty 2, 28d supply, fill #0
  Filled 2023-07-01 – 2023-07-15 (×2): qty 2, 28d supply, fill #1

## 2023-05-30 ENCOUNTER — Other Ambulatory Visit (HOSPITAL_BASED_OUTPATIENT_CLINIC_OR_DEPARTMENT_OTHER): Payer: Self-pay

## 2023-05-31 ENCOUNTER — Other Ambulatory Visit (HOSPITAL_BASED_OUTPATIENT_CLINIC_OR_DEPARTMENT_OTHER): Payer: Self-pay

## 2023-06-01 ENCOUNTER — Other Ambulatory Visit (HOSPITAL_BASED_OUTPATIENT_CLINIC_OR_DEPARTMENT_OTHER): Payer: Self-pay

## 2023-06-01 MED ORDER — PREGABALIN 150 MG PO CAPS
150.0000 mg | ORAL_CAPSULE | Freq: Three times a day (TID) | ORAL | 2 refills | Status: DC
  Filled 2023-06-01 – 2023-06-24 (×2): qty 90, 30d supply, fill #0

## 2023-06-03 ENCOUNTER — Other Ambulatory Visit (HOSPITAL_BASED_OUTPATIENT_CLINIC_OR_DEPARTMENT_OTHER): Payer: Self-pay

## 2023-06-06 ENCOUNTER — Other Ambulatory Visit (HOSPITAL_BASED_OUTPATIENT_CLINIC_OR_DEPARTMENT_OTHER): Payer: Self-pay

## 2023-06-07 ENCOUNTER — Encounter: Payer: Self-pay | Admitting: Family Medicine

## 2023-06-07 ENCOUNTER — Other Ambulatory Visit (HOSPITAL_BASED_OUTPATIENT_CLINIC_OR_DEPARTMENT_OTHER): Payer: Self-pay

## 2023-06-07 ENCOUNTER — Ambulatory Visit (INDEPENDENT_AMBULATORY_CARE_PROVIDER_SITE_OTHER): Payer: Commercial Managed Care - PPO | Admitting: Family Medicine

## 2023-06-07 ENCOUNTER — Ambulatory Visit (HOSPITAL_BASED_OUTPATIENT_CLINIC_OR_DEPARTMENT_OTHER)
Admission: RE | Admit: 2023-06-07 | Discharge: 2023-06-07 | Disposition: A | Discharge status: Home / Self Care | Payer: Commercial Managed Care - PPO | Source: Ambulatory Visit | Attending: Family Medicine | Admitting: Family Medicine

## 2023-06-07 VITALS — BP 138/66 | Ht 69.5 in | Wt 197.0 lb

## 2023-06-07 DIAGNOSIS — M19011 Primary osteoarthritis, right shoulder: Secondary | ICD-10-CM | POA: Diagnosis not present

## 2023-06-07 DIAGNOSIS — M25511 Pain in right shoulder: Secondary | ICD-10-CM

## 2023-06-07 DIAGNOSIS — Z981 Arthrodesis status: Secondary | ICD-10-CM | POA: Diagnosis not present

## 2023-06-07 MED ORDER — TRIAMCINOLONE ACETONIDE 40 MG/ML IJ SUSP
40.0000 mg | Freq: Once | INTRAMUSCULAR | Status: AC
  Administered 2023-06-07: 40 mg via INTRA_ARTICULAR

## 2023-06-07 NOTE — Progress Notes (Signed)
CHIEF COMPLAINT: No chief complaint on file.  _____________________________________________________________ SUBJECTIVE  HPI  Pt is a 67 y.o. female with history of rheumatoid arthritis here for evaluation of acute R shoulder pain.   Has hx RA and OA (end stage L hip and R knee). Started 3-4 months ago, an initial twinge, and has gotten to the point that it wakes her up at night. Pain lateral and radiates down to her elbow. Shares that she's not sure the RA treatment has been helping lately, recently changed. No recent injuries/falls on her R shoulder, but did have a syncopal episode December 2021, and she doesn't know how she landed When the pain comes on it will last 30 minutes until voltaren gel sets in. will take robaxin during the day. Will occasionally feel a snapping sensation when she moves her shoulder around, but does not always have pain with this Applies voltaren gel, hot showers, ice/heat, cooling washes.    07/14/2018 R shoulder [place]: Biceps tendon intact on long and trans views. AC joint with moderate arthropathy. Subscapularis and infraspinatus appear normal without abnormalities. Supraspinatus with two small tears, largest of which with 2mm retraction. Tendon mildly hypoechoic consistent with chronic tendinopathy. Very mild bursitis.  ------------------------------------------------------------------------------------------------------ Past Medical History:  Diagnosis Date   Allergic rhinitis    Anxiety    Asthma    hx bronchial asthma at times with upper resp infection   Depression    Fibromyalgia    GERD (gastroesophageal reflux disease)    Headache(784.0)    migraine and cluster headaches   Hyperlipemia    Hypertension    IBS (irritable bowel syndrome)    Menopause    Neuromuscular disorder (HCC) 2009   hx of fibromyalgia, polyarthralsia (surgery induced)   Osteoarthritis    Rheumatoid arthritis (HCC)    Complicated by osteoarthritis as well.   Rheumatoid  arthritis (HCC)    Stress incontinence    at times   Tibial fracture 10/14/2016   evulsion periostial right   Type 2 diabetes mellitus with complication, without long-term current use of insulin (HCC)    diet controlled no meds -> however was diagnosed with a diabetic foot ulcer.    Past Surgical History:  Procedure Laterality Date   ABDOMINAL HYSTERECTOMY  12/1999   complete   CARPAL TUNNEL RELEASE Right 12/27/2017   Procedure: CARPAL TUNNEL RELEASE;  Surgeon: Dominica Severin, MD;  Location: [facility];  Service: Orthopedics;  Laterality: Right;   CERVICAL CONIZATION W/BX  june 1990   dysplasia of cervix/used 5Fu cream for 3 months   CERVICAL LAMINECTOMY  2005 & 2009   X 2   NO ROM PROBLEMS   CESAREAN SECTION  1989 & 1993   X 2   CHOLECYSTECTOMY  1986   DILATION AND CURETTAGE OF UTERUS   [phone]   missed abortion   ESOPHAGOGASTRODUODENOSCOPY  06/29/2011   Procedure: ESOPHAGOGASTRODUODENOSCOPY (EGD);  Surgeon: Kandis Cocking, MD;  Location: [facility];  Service: General;  Laterality: N/A;   FOOT SURGERY  2005   RT HEEL   GASTRIC BANDING PORT REVISION  09/24/2011   Procedure: GASTRIC BANDING PORT REVISION;  Surgeon: Valarie Merino, MD;  Location: [facility];  Service: General;  Laterality: N/A;   GASTRIC ROUX-EN-Y N/A 06/06/2017   Procedure: 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;  Surgeon: Luretha Murphy, MD;  Location: [facility];  Service: General;  Laterality: N/A;   HYSTEROSCOPY  1999   LAPAROSCOPIC GASTRIC BANDING  12/30/09  LAPAROSCOPIC GASTRIC SLEEVE RESECTION N/A 07/02/2013   Procedure: LAPAROSCOPIC GASTRIC SLEEVE RESECTION upper endoscopy;  Surgeon: Valarie Merino, MD;  Location: [facility];  Service: General;  Laterality: N/A;   LAPAROSCOPIC REPAIR AND REMOVAL OF GASTRIC BAND N/A 07/02/2013   Procedure: LAPAROSCOPIC REMOVAL OF GASTRIC BAND;  Surgeon: Valarie Merino, MD;  Location: [facility];  Service: General;  Laterality: N/A;    LAPAROSCOPIC REVISION OF GASTRIC BAND  07/03/2012   Procedure: LAPAROSCOPIC REVISION OF GASTRIC BAND;  Surgeon: Valarie Merino, MD;  Location: [facility];  Service: General;  Laterality: N/A;  removal of old lap. band port, replaced with AP standard band   LAPAROSCOPY  09/24/2011   Procedure: LAPAROSCOPY DIAGNOSTIC;  Surgeon: Valarie Merino, MD;  Location: [facility];  Service: General;  Laterality: N/A;   LAPAROSCOPY  07/03/2012   Procedure: LAPAROSCOPY DIAGNOSTIC;  Surgeon: Valarie Merino, MD;  Location: [facility];  Service: General;  Laterality: N/A;  Exploratory Laparoscopy    MESH APPLIED TO LAP PORT  07/03/2012   Procedure: MESH APPLIED TO LAP PORT;  Surgeon: Valarie Merino, MD;  Location: [facility];  Service: General;  Laterality: N/A;   NASAL SINUS SURGERY  1995 & 1997   X 2   right knee  1981   ARTHROSCOPY AND ARTHROTOMY   right knee arthroscopy and arthrotomy  12-1979   TONSILLECTOMY  1971   TUBAL LIGATION  1993   WITH C -SECTION      No outpatient medications have been marked as taking for the 06/07/23 encounter (Office Visit) with Burna Forts, MD.    ------------------------------------------------------------------------------------------------------  _____________________________________________________________ OBJECTIVE  PHYSICAL EXAM  Today's Vitals   06/07/23 1338  BP: 138/66  Weight: 197 lb (89.4 kg)  Height: 5' 9.5" (1.765 m)   Body mass index is 28.67 kg/m.   reviewed  General: A+Ox3, no acute distress, well-nourished, appropriate affect CV: pulses 2+ regular, nondiaphoretic, no peripheral edema, cap refill <2sec Lungs: no audible wheezing, non-labored breathing, bilateral chest rise/fall, nontachypneic Skin: warm, well-perfused, non-icteric, no susp lesions or rashes Neuro: no focal deficits. Sensation intact, muscle tone wnl, no atrophy Psych: no signs of depression or anxiety MSK:   R Shoulder:  No deformity, swelling or muscle wasting No scapular  winging FF 180, abd 180, int 0, ext 90. Symmetric ER Some discomfort over lateral aspect humerus NTTP over the [place], clavicle, ac, coracoid, biceps groove, deltoid, subacromial space, scap spine, musculature, trap, cervical spine +empty can. IR shoulder while in abduction reproduced snapping sensation.   Neg neer, hawkins, empty can, scarf test, hornblower, resisted anterior flexion, subscap liftoff, speeds, obriens Neg apprehension FROM of neck  _____________________________________________________________ ASSESSMENT/PLAN Diagnoses and all orders for this visit:  Acute pain of right shoulder -     DG Shoulder Right; Future -     triamcinolone acetonide (KENALOG-40) injection 40 mg   R shoulder XR obtained today, results reviewed with patient. Suspected rotator cuff involvement, including tendinosis. Discussed options for management, proceeded with subacromial bursa injection to trial improvement. Anticipate f/u with rheumatologist in light of recurrent joint concerns following change of medication. Rekha will re-incorporate PT shoulder exercises that she previously received.  Subacromial Bursa Injection Site: right  After PARQ discussed and verbal consent given, the site was cleaned with alcohol. Steroid injection was performed using sterile technique with 4mL 1% lidocaine, 1mL 40mg /mL kenalog and 22G 1.5in needle. This was well tolerated and resulted in little relief. Dressing placed and post injection instructions were given  including a discussion of likely return of pain today after the anesthetic wears off (with the possibility of worsened pain) until the steroid starts to work in 1-3 days. Call or return to clinic prn if these symptoms worsen or fail to improve as anticipated.   F/u 3-4 weeks All questions answered. Return precautions discussed. Patient verbalized understanding and is in agreement with plan  Electronically signed by: Burna Forts, MD 06/07/2023 1:30 PM

## 2023-06-09 NOTE — Progress Notes (Incomplete)
CHIEF COMPLAINT: No chief complaint on file.  _____________________________________________________________ SUBJECTIVE  HPI  Pt is a 67 y.o. female with history of rheumatoid arthritis here for evaluation of acute R shoulder pain.   Has hx RA and OA (end stage L hip and R knee). Started 3-4 months ago, an initial twinge, and has gotten to the point that it wakes her up at night. Pain lateral and radiates down to her elbow. Shares that she's not sure the RA treatment has been helping lately. No recent injuries/falls on her R shoulder, but did have a syncopal episode December 2021, and she doesn't know how she landed When it comes on will last 30 minutes until voltaren will take robaxin during the day Applies voltaren gel, hot showers, ice/heat, cooling washes.   History of previous trauma  Severe pain   07/14/2018 R shoulder [place]: Biceps tendon intact on long and trans views. AC joint with moderate arthropathy. Subscapularis and infraspinatus appear normal without abnormalities. Supraspinatus with two small tears, largest of which with 2mm retraction. Tendon mildly hypoechoic consistent with chronic tendinopathy. Very mild bursitis.  ------------------------------------------------------------------------------------------------------ Past Medical History:  Diagnosis Date  . Allergic rhinitis   . Anxiety   . Asthma    hx bronchial asthma at times with upper resp infection  . Depression   . Fibromyalgia   . GERD (gastroesophageal reflux disease)   . Headache(784.0)    migraine and cluster headaches  . Hyperlipemia   . Hypertension   . IBS (irritable bowel syndrome)   . Menopause   . Neuromuscular disorder (HCC) 2009   hx of fibromyalgia, polyarthralsia (surgery induced)  . Osteoarthritis   . Rheumatoid arthritis (HCC)    Complicated by osteoarthritis as well.  . Rheumatoid arthritis (HCC)   . Stress incontinence    at times  . Tibial fracture 10/14/2016   evulsion periostial  right  . Type 2 diabetes mellitus with complication, without long-term current use of insulin (HCC)    diet controlled no meds -> however was diagnosed with a diabetic foot ulcer.    Past Surgical History:  Procedure Laterality Date  . ABDOMINAL HYSTERECTOMY  12/1999   complete  . CARPAL TUNNEL RELEASE Right 12/27/2017   Procedure: CARPAL TUNNEL RELEASE;  Surgeon: Dominica Severin, MD;  Location: [facility];  Service: Orthopedics;  Laterality: Right;  . CERVICAL CONIZATION W/BX  june 1990   dysplasia of cervix/used 5Fu cream for 3 months  . CERVICAL LAMINECTOMY  2005 & 2009   X 2   NO ROM PROBLEMS  . CESAREAN SECTION  1989 & 1993   X 2  . CHOLECYSTECTOMY  1986  . DILATION AND CURETTAGE OF UTERUS   [phone]   missed abortion  . ESOPHAGOGASTRODUODENOSCOPY  06/29/2011   Procedure: ESOPHAGOGASTRODUODENOSCOPY (EGD);  Surgeon: Kandis Cocking, MD;  Location: [facility];  Service: General;  Laterality: N/A;  . FOOT SURGERY  2005   RT HEEL  . GASTRIC BANDING PORT REVISION  09/24/2011   Procedure: GASTRIC BANDING PORT REVISION;  Surgeon: Valarie Merino, MD;  Location: [facility];  Service: General;  Laterality: N/A;  . GASTRIC ROUX-EN-Y N/A 06/06/2017   Procedure: 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;  Surgeon: Luretha Murphy, MD;  Location: [facility];  Service: General;  Laterality: N/A;  . HYSTEROSCOPY  1999  . LAPAROSCOPIC GASTRIC BANDING  12/30/09  . LAPAROSCOPIC GASTRIC SLEEVE RESECTION N/A 07/02/2013   Procedure: LAPAROSCOPIC GASTRIC SLEEVE RESECTION upper endoscopy;  Surgeon: Thornton Park  [name], MD;  Location: [facility];  Service: General;  Laterality: N/A;  . LAPAROSCOPIC REPAIR AND REMOVAL OF GASTRIC BAND N/A 07/02/2013   Procedure: LAPAROSCOPIC REMOVAL OF GASTRIC BAND;  Surgeon: Valarie Merino, MD;  Location: [facility];  Service: General;  Laterality: N/A;  . LAPAROSCOPIC REVISION OF GASTRIC BAND  07/03/2012   Procedure: LAPAROSCOPIC REVISION OF  GASTRIC BAND;  Surgeon: Valarie Merino, MD;  Location: [facility];  Service: General;  Laterality: N/A;  removal of old lap. band port, replaced with AP standard band  . LAPAROSCOPY  09/24/2011   Procedure: LAPAROSCOPY DIAGNOSTIC;  Surgeon: Valarie Merino, MD;  Location: [facility];  Service: General;  Laterality: N/A;  . LAPAROSCOPY  07/03/2012   Procedure: LAPAROSCOPY DIAGNOSTIC;  Surgeon: Valarie Merino, MD;  Location: [facility];  Service: General;  Laterality: N/A;  Exploratory Laparoscopy   . MESH APPLIED TO LAP PORT  07/03/2012   Procedure: MESH APPLIED TO LAP PORT;  Surgeon: Valarie Merino, MD;  Location: [facility];  Service: General;  Laterality: N/A;  . NASAL SINUS SURGERY  1995 & 1997   X 2  . right knee  1981   ARTHROSCOPY AND ARTHROTOMY  . right knee arthroscopy and arthrotomy  12-1979  . TONSILLECTOMY  1971  . TUBAL LIGATION  1993   WITH C -SECTION      No outpatient medications have been marked as taking for the 06/07/23 encounter (Office Visit) with Burna Forts, MD.    ------------------------------------------------------------------------------------------------------  _____________________________________________________________ OBJECTIVE  PHYSICAL EXAM  There were no vitals filed for this visit. There is no height or weight on file to calculate BMI.   reviewed  General: A+Ox3, no acute distress, well-nourished, appropriate affect CV: pulses 2+ regular, nondiaphoretic, no peripheral edema, cap refill <2sec Lungs: no audible wheezing, non-labored breathing, bilateral chest rise/fall, nontachypneic Skin: warm, well-perfused, non-icteric, no susp lesions or rashes Neuro: CN II-XII intact bilaterally. Sensation intact, muscle tone wnl, no atrophy Psych: no signs of depression or anxiety MSK: ***      _____________________________________________________________ ASSESSMENT/PLAN Diagnoses and all orders for this visit:  Acute pain of right shoulder -     DG  Shoulder Right; Future    Summary: ***  Differential Diagnosis:  Treatment:  Return precautions SAB CSI Rheum f/u F/u 3-4 weeks Incorporate shoulder PT exercises, she has these at home  No follow-ups on file.  Electronically signed by: Burna Forts, MD 06/07/2023 1:30 PM

## 2023-06-10 ENCOUNTER — Other Ambulatory Visit: Payer: Self-pay

## 2023-06-10 ENCOUNTER — Other Ambulatory Visit (HOSPITAL_BASED_OUTPATIENT_CLINIC_OR_DEPARTMENT_OTHER): Payer: Self-pay

## 2023-06-15 ENCOUNTER — Other Ambulatory Visit (HOSPITAL_BASED_OUTPATIENT_CLINIC_OR_DEPARTMENT_OTHER): Payer: Self-pay

## 2023-06-15 ENCOUNTER — Other Ambulatory Visit: Payer: Self-pay | Admitting: Family Medicine

## 2023-06-15 ENCOUNTER — Other Ambulatory Visit: Payer: Self-pay

## 2023-06-15 DIAGNOSIS — K219 Gastro-esophageal reflux disease without esophagitis: Secondary | ICD-10-CM

## 2023-06-15 MED ORDER — PANTOPRAZOLE SODIUM 40 MG PO TBEC
40.0000 mg | DELAYED_RELEASE_TABLET | Freq: Every day | ORAL | 3 refills | Status: DC
  Filled 2023-06-15 – 2023-07-01 (×3): qty 90, 90d supply, fill #0
  Filled 2023-10-02: qty 90, 90d supply, fill #1
  Filled 2024-01-06: qty 90, 90d supply, fill #2
  Filled 2024-04-05: qty 90, 90d supply, fill #3

## 2023-06-16 ENCOUNTER — Other Ambulatory Visit: Payer: Self-pay

## 2023-06-16 ENCOUNTER — Other Ambulatory Visit (HOSPITAL_BASED_OUTPATIENT_CLINIC_OR_DEPARTMENT_OTHER): Payer: Self-pay

## 2023-06-17 ENCOUNTER — Other Ambulatory Visit (HOSPITAL_BASED_OUTPATIENT_CLINIC_OR_DEPARTMENT_OTHER): Payer: Self-pay

## 2023-06-20 ENCOUNTER — Other Ambulatory Visit (HOSPITAL_BASED_OUTPATIENT_CLINIC_OR_DEPARTMENT_OTHER): Payer: Self-pay

## 2023-06-20 ENCOUNTER — Encounter: Payer: Self-pay | Admitting: Neurology

## 2023-06-21 ENCOUNTER — Telehealth: Payer: Self-pay

## 2023-06-21 ENCOUNTER — Other Ambulatory Visit (HOSPITAL_BASED_OUTPATIENT_CLINIC_OR_DEPARTMENT_OTHER): Payer: Self-pay

## 2023-06-21 NOTE — Telephone Encounter (Signed)
PA needed for Emgality.

## 2023-06-22 ENCOUNTER — Other Ambulatory Visit (HOSPITAL_BASED_OUTPATIENT_CLINIC_OR_DEPARTMENT_OTHER): Payer: Self-pay

## 2023-06-23 ENCOUNTER — Other Ambulatory Visit (HOSPITAL_BASED_OUTPATIENT_CLINIC_OR_DEPARTMENT_OTHER): Payer: Self-pay

## 2023-06-24 ENCOUNTER — Other Ambulatory Visit (HOSPITAL_BASED_OUTPATIENT_CLINIC_OR_DEPARTMENT_OTHER): Payer: Self-pay

## 2023-06-24 ENCOUNTER — Other Ambulatory Visit: Payer: Self-pay

## 2023-06-27 ENCOUNTER — Other Ambulatory Visit (HOSPITAL_BASED_OUTPATIENT_CLINIC_OR_DEPARTMENT_OTHER): Payer: Self-pay

## 2023-06-27 NOTE — Telephone Encounter (Signed)
Pt called in and left a message with the access nurse. She is waiting on a prescription. Pharmacy has sent over multiple requests for prior auth to the office.

## 2023-06-27 NOTE — Telephone Encounter (Signed)
Patient called for update on status of PA emgality.   Patient advised she may stop by to pick up sample.

## 2023-06-28 ENCOUNTER — Encounter: Payer: Self-pay | Admitting: Podiatry

## 2023-06-28 ENCOUNTER — Other Ambulatory Visit (HOSPITAL_BASED_OUTPATIENT_CLINIC_OR_DEPARTMENT_OTHER): Payer: Self-pay

## 2023-06-28 ENCOUNTER — Ambulatory Visit (INDEPENDENT_AMBULATORY_CARE_PROVIDER_SITE_OTHER): Payer: Commercial Managed Care - PPO | Admitting: Podiatry

## 2023-06-28 DIAGNOSIS — M79675 Pain in left toe(s): Secondary | ICD-10-CM

## 2023-06-28 DIAGNOSIS — M79674 Pain in right toe(s): Secondary | ICD-10-CM

## 2023-06-28 DIAGNOSIS — E1142 Type 2 diabetes mellitus with diabetic polyneuropathy: Secondary | ICD-10-CM | POA: Diagnosis not present

## 2023-06-28 DIAGNOSIS — B351 Tinea unguium: Secondary | ICD-10-CM

## 2023-06-28 NOTE — Progress Notes (Signed)
Subjective:  Patient ID: [name], female    DOB: 30-Nov-1955,  MRN: [id number]  [name] presents to clinic today for at risk foot care with history of diabetic neuropathy and painful thick toenails that are difficult to trim. Pain interferes with ambulation. Aggravating factors include wearing enclosed shoe gear. Pain is relieved with periodic professional debridement. She continues to use Aquaphor moisturizer on her feet daily. Chief Complaint  Patient presents with   Diabetes    DFC , PATIENT STATES SHE SAW HER PCP IN OCTOBER , PATIENT A1C WAS 5.5    New problem(s): None.   PCP is Zola Button, [place] R, DO.  Allergies  Allergen Reactions   Gabapentin Swelling    Pt states she has eye swelling    Isoniazid     Severe SOB   Morphine Hives and Nausea And Vomiting    Allergic to PCA pump only    Nitrofurantoin Shortness Of Breath, Rash and Other (See Comments)    REACTION: welts   Tamiflu [Oseltamivir Phosphate] Nausea And Vomiting    "I vomited within 30 minutes of taking it and was told I cannot ever take it again."   Hctz [Hydrochlorothiazide] Rash   Macrolides And Ketolides Rash   Promethazine Other (See Comments)    Other Reaction(s): Psychosis  IF GIVEN  IV-hallucinations     CAN TAKE PO PHENERGAN, , Other reaction(s): Hallucinations, IF GIVEN  IV-hallucinations     CAN TAKE PO PHENERGAN, Other reaction(s): Unknown   Wound Dressing Adhesive Rash    Adhesive Tape    Fentanyl Hives, Itching and Rash    REACTION: welts   Sulfamethoxazole-Trimethoprim Rash    Review of Systems: Negative except as noted in the HPI.  Objective: No changes noted in today's physical examination. There were no vitals filed for this visit. [name] is a pleasant 67 y.o. female in NAD. AAO x 3.  Vascular Examination: Capillary refill time immediate b/l. Vascular status intact b/l with palpable pedal pulses. Pedal hair present b/l. No edema. No pain with calf  compression b/l. Skin temperature gradient WNL b/l.   Neurological Examination: Sensation grossly intact b/l with 10 gram monofilament. Vibratory sensation intact b/l. Pt has subjective symptoms of neuropathy.  Dermatological Examination: Pedal skin with normal turgor, texture and tone b/l.  No open wounds. No interdigital macerations.   Toenails 1-5 b/l dystrophic. Incurvated nailplate right 2nd toe both border(s). No erythema, no edema, no drainage noted.   No hyperkeratotic nor porokeratotic lesions present on today's visit.  Musculoskeletal Examination: Muscle strength 5/5 to all lower extremity muscle groups bilaterally. HAV with bunion deformity noted b/l LE. Clawtoe deformity 1-5 bilaterally.   Last A1c:      Latest Ref Rng & Units 04/22/2023    9:11 AM 10/22/2022    9:40 AM  Hemoglobin A1C  Hemoglobin-A1c 4.6 - 6.5 % 5.5  5.5    Assessment/Plan: 1. Pain due to onychomycosis of toenails of both feet   2. Diabetic peripheral neuropathy associated with type 2 diabetes mellitus (HCC)    -Consent given for treatment as described below: -Examined patient. -Continue foot and shoe inspections daily. Monitor blood glucose per PCP/Endocrinologist's recommendations. -Patient to continue soft, supportive shoe gear daily. -Toenails 1-5 left foot, 3-5 right foot, and right great toe debrided in length and girth without iatrogenic bleeding with sterile nail nipper and dremel.  -No invasive procedure(s) performed. Offending nail border debrided and curretaged R 2nd toe utilizing sterile nail nipper and  currette. Pinpoint bleeding addressed with Lumicain Hemostatic solution. Border(s) cleansed with alcohol and triple antibiotic ointment applied. Patient/POA/Caregiver/Facility instructed to apply mupirocin ointment  to right 2nd toe once daily for 7 days. Call office if there are any concerns. -Patient/POA to call should there be question/concern in the interim.   Return in about 3 months  (around 09/26/2023).  Freddie Breech, DPM      [facility] LOCATION: [address] [place] [address]                   Office [phone]   [facility] LOCATION: [address] [place], [place] [address] Office [phone]

## 2023-06-29 ENCOUNTER — Telehealth: Payer: Self-pay | Admitting: Pharmacy Technician

## 2023-06-29 ENCOUNTER — Other Ambulatory Visit (HOSPITAL_COMMUNITY): Payer: Self-pay

## 2023-06-29 NOTE — Telephone Encounter (Signed)
PA has been submitted, and telephone encounter has been created. 

## 2023-06-29 NOTE — Telephone Encounter (Signed)
Pharmacy Patient Advocate Encounter   Received notification from Pt Calls Messages that prior authorization for [facility] 120MG  is required/requested.   Insurance verification completed.   The patient is insured through [employer] .   Per test claim: PA required; PA submitted to above mentioned insurance via CoverMyMeds Key/confirmation #/EOC [id number] Status is pending

## 2023-06-30 ENCOUNTER — Other Ambulatory Visit (HOSPITAL_BASED_OUTPATIENT_CLINIC_OR_DEPARTMENT_OTHER): Payer: Self-pay

## 2023-07-01 ENCOUNTER — Other Ambulatory Visit (HOSPITAL_COMMUNITY): Payer: Self-pay

## 2023-07-01 ENCOUNTER — Other Ambulatory Visit (HOSPITAL_BASED_OUTPATIENT_CLINIC_OR_DEPARTMENT_OTHER): Payer: Self-pay

## 2023-07-01 NOTE — Telephone Encounter (Signed)
Pharmacy Patient Advocate Encounter  Received notification from [facility] that Prior Authorization for [facility] 120MG  has been APPROVED from 12.19.24 to [id number]. Ran test claim, Copay is $0. This test claim was processed through [facility]- copay amounts may vary at other pharmacies due to pharmacy/plan contracts, or as the patient moves through the different stages of their insurance plan.   PA #/Case ID/Reference #: [id number]

## 2023-07-05 ENCOUNTER — Other Ambulatory Visit (HOSPITAL_BASED_OUTPATIENT_CLINIC_OR_DEPARTMENT_OTHER): Payer: Self-pay

## 2023-07-11 DIAGNOSIS — M0609 Rheumatoid arthritis without rheumatoid factor, multiple sites: Secondary | ICD-10-CM | POA: Diagnosis not present

## 2023-07-12 ENCOUNTER — Other Ambulatory Visit (HOSPITAL_BASED_OUTPATIENT_CLINIC_OR_DEPARTMENT_OTHER): Payer: Self-pay

## 2023-07-13 LAB — LAB REPORT - SCANNED
EGFR: 57
HM Hepatitis Screen: NEGATIVE

## 2023-07-14 ENCOUNTER — Other Ambulatory Visit (HOSPITAL_BASED_OUTPATIENT_CLINIC_OR_DEPARTMENT_OTHER): Payer: Self-pay

## 2023-07-15 ENCOUNTER — Encounter: Payer: Self-pay | Admitting: Family

## 2023-07-15 ENCOUNTER — Other Ambulatory Visit (HOSPITAL_BASED_OUTPATIENT_CLINIC_OR_DEPARTMENT_OTHER): Payer: Self-pay

## 2023-07-15 ENCOUNTER — Encounter: Payer: Self-pay | Admitting: Family Medicine

## 2023-07-15 ENCOUNTER — Other Ambulatory Visit: Payer: Self-pay

## 2023-07-15 MED ORDER — TIRZEPATIDE 10 MG/0.5ML ~~LOC~~ SOAJ
10.0000 mg | SUBCUTANEOUS | 0 refills | Status: DC
  Filled 2023-07-15: qty 6, 84d supply, fill #0
  Filled 2023-07-15: qty 2, 28d supply, fill #0

## 2023-07-19 DIAGNOSIS — D225 Melanocytic nevi of trunk: Secondary | ICD-10-CM | POA: Diagnosis not present

## 2023-07-19 DIAGNOSIS — L821 Other seborrheic keratosis: Secondary | ICD-10-CM | POA: Diagnosis not present

## 2023-07-19 DIAGNOSIS — L578 Other skin changes due to chronic exposure to nonionizing radiation: Secondary | ICD-10-CM | POA: Diagnosis not present

## 2023-07-19 DIAGNOSIS — L814 Other melanin hyperpigmentation: Secondary | ICD-10-CM | POA: Diagnosis not present

## 2023-07-26 DIAGNOSIS — M0609 Rheumatoid arthritis without rheumatoid factor, multiple sites: Secondary | ICD-10-CM | POA: Diagnosis not present

## 2023-07-27 ENCOUNTER — Ambulatory Visit: Payer: Commercial Managed Care - PPO | Attending: Cardiology | Admitting: Cardiology

## 2023-07-27 ENCOUNTER — Other Ambulatory Visit (HOSPITAL_BASED_OUTPATIENT_CLINIC_OR_DEPARTMENT_OTHER): Payer: Self-pay

## 2023-07-27 ENCOUNTER — Encounter: Payer: Self-pay | Admitting: Cardiology

## 2023-07-27 VITALS — BP 100/44 | HR 77 | Ht 69.5 in | Wt 195.0 lb

## 2023-07-27 DIAGNOSIS — I951 Orthostatic hypotension: Secondary | ICD-10-CM

## 2023-07-27 DIAGNOSIS — I350 Nonrheumatic aortic (valve) stenosis: Secondary | ICD-10-CM

## 2023-07-27 DIAGNOSIS — R0989 Other specified symptoms and signs involving the circulatory and respiratory systems: Secondary | ICD-10-CM | POA: Diagnosis not present

## 2023-07-27 DIAGNOSIS — E785 Hyperlipidemia, unspecified: Secondary | ICD-10-CM

## 2023-07-27 DIAGNOSIS — E1169 Type 2 diabetes mellitus with other specified complication: Secondary | ICD-10-CM | POA: Diagnosis not present

## 2023-07-27 MED ORDER — HYDRALAZINE HCL 25 MG PO TABS
25.0000 mg | ORAL_TABLET | Freq: Two times a day (BID) | ORAL | 3 refills | Status: DC
  Filled 2023-07-27: qty 190, 95d supply, fill #0
  Filled 2023-09-20: qty 190, 90d supply, fill #0
  Filled 2023-12-27: qty 190, 90d supply, fill #1
  Filled 2024-03-25: qty 190, 90d supply, fill #2
  Filled 2024-07-01: qty 190, 90d supply, fill #3

## 2023-07-27 MED ORDER — OLMESARTAN MEDOXOMIL 20 MG PO TABS
20.0000 mg | ORAL_TABLET | Freq: Every day | ORAL | 3 refills | Status: DC
  Filled 2023-07-27 – 2023-09-14 (×2): qty 90, 90d supply, fill #0
  Filled 2023-12-09: qty 90, 90d supply, fill #1
  Filled 2024-03-13: qty 90, 90d supply, fill #2

## 2023-07-27 NOTE — Patient Instructions (Addendum)
 Medication Instructions:     If blood pressure is sustain above 150 systolic  take medication or below 110 systolic do not take your hydralazine  that day   *If you need a refill on your cardiac medications before your next appointment, please call your pharmacy*   Lab Work: Not needed    Testing/Procedures: Not needed    Follow-Up: At [facility], you and your health needs are our priority.  As part of our continuing mission to provide you with exceptional heart care, we have created designated Provider Care Teams.  These Care Teams include your primary Cardiologist (physician) and Advanced Practice Providers (APPs -  Physician Assistants and Nurse Practitioners) who all work together to provide you with the care you need, when you need it.     Your next appointment:   12 month(s)  The format for your next appointment:   In Person  Provider:   Randene Bustard, MD

## 2023-07-27 NOTE — Progress Notes (Signed)
Cardiology Office Note:  .   Date:  07/30/2023  ID:  MEKIYA NEMECHEK, DOB [birth year], MRN [id number] PCP: Zola Button, [place], DO  [facility] Providers Cardiologist:  Bryan Lemma, MD     Chief Complaint  Patient presents with   Follow-up    Annual   Hypertension    Patient Profile: .     [name] is a (formerly obese) 68 y.o. 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  She is s/p Gastric Bypass Surgery => considering Pannectomy   [name] was last seen on 07/23/22 - she was doing well.  Recently started Ozempic with notable Wgt Loss (initially having hypoglycemic issues- gets lightheaded).  BP levels have been stable. No syncope or near syncope.  She is maintaining adequate hydration - using minimal RN Lasix. => continued on olmesartan 20mg  daily along with hydralazine 25 mg BID & PRN sustained SBPs>150 mmHg.  Also can hold for SBP < 110s.  On rosuvastatin.   Subjective  Discussed the use of AI scribe software for clinical note transcription with the patient, who gave verbal consent to proceed.  History of Present Illness   The patient, with a history of rheumatoid arthritis (RA), hypertension, hyperlipidemia and diabetes type II, presents for routine follow up.    She has well controlled DM-2 diabetes, which is currently managed with Mounjaro. Her most recent A1c was 5.5. However, she has been experiencing recurrent infections in the pannus from a previous surgery, which she believes is due to her diabetes and RA. She has been trying to get approval for a surgical removal of the pannus, but has been unsuccessful so far.  The patient also reports occasional irregular heartbeats, which she has noticed when taking her blood pressure. She  has a family history of atrial fibrillation and is concerned about developing this condition.  She has a family history of atrial fibrillation and is concerned about developing this condition -> but she has not had any prolonged palpitations. She has not experienced any syncope, chest pain, or shortness of breath.   She denies PND, orthopnea, or edema.  Despite having "palpitations", she denies any syncope, near syncope, TIA/CVA/amaurosis fugax or claudication.   Her most notable complaint is worsening joint pain, particularly in the hips, knees, and right shoulder. The pain is severe enough to wake her up at night. The patient was previously on acetaminophen for RA, but it was discontinued due to elevated liver function tests (LFTs). She has since tried Simponi, which was ineffective, and has recently started Cimzia. However, she reports no improvement with the new medication.  The patient also reports frequent headaches and elevated blood pressure, which she attributes to her pain. She has been monitoring her blood pressure at home due to a history of orthostatic hypertension. The readings have been variable, with some as low as 100/68.     Cardiovascular ROS: no chest pain or dyspnea on exertion positive for - palpitations and fatigue - limted by RA Sx; some high BP levels - but 1-2/wk PRN Hydralazine - no > 150s SBP negative for - orthopnea, paroxysmal nocturnal dyspnea, rapid heart rate, shortness of breath, or syncope/near syncope; TIA/CVA/Amaurosis Fugax, claudication.   ROS:  Review of Systems - Negative except SX in HPI & intertriginous infections  related to Abdominal pannus; otherwise + below ; still losing weight, slowly.  Chronic Back & Hip pain from RA- limiting walking.    Objective   Studies Reviewed: Marland Kitchen   EKG Interpretation Date/Time:  Wednesday July 27 2023 09:13:30 EST Ventricular Rate:  77 PR Interval:  166 QRS Duration:  130 QT Interval:  420 QTC Calculation: 475 R  Axis:   7  Text Interpretation: Normal sinus rhythm Possible Left atrial enlargement Non-specific intra-ventricular conduction block When compared with ECG of 09-Jun-2021 16:27, No significant change since last tracing Confirmed by Bryan Lemma ([id number]) on 07/27/2023 9:25:50 AM    Lab Results  Component Value Date   CHOL 118 04/22/2023   HDL 70.00 04/22/2023   LDLCALC 38 04/22/2023   TRIG 50.0 04/22/2023   CHOLHDL 2 04/22/2023   Lab Results  Component Value Date   HGBA1C 5.5 04/22/2023    Lab Results  Component Value Date   NA 145 04/25/2023   K 4.6 04/25/2023   CREATININE 0.82 04/25/2023   GFRNONAA >60 04/25/2023   GLUCOSE 86 04/25/2023      Latest Ref Rng & Units 04/25/2023    8:28 AM 04/22/2023    9:11 AM 02/04/2023    8:13 AM  CBC  WBC 4.0 - 10.5 K/uL 5.3  5.4  5.5   Hemoglobin 12.0 - 15.0 g/dL 62.1  30.8  65.7   Hematocrit 36.0 - 46.0 % 40.8  42.8  39.7   Platelets 150 - 400 K/uL 168  182.0  141    Risk Assessment/Calculations:          Physical Exam:   VS:  BP (!) 100/44   Pulse 77   Ht 5' 9.5" (1.765 m)   Wt 195 lb (88.5 kg)   SpO2 94%   BMI 28.38 kg/m    Wt Readings from Last 3 Encounters:  07/27/23 195 lb (88.5 kg)  06/07/23 197 lb (89.4 kg)  04/25/23 197 lb (89.4 kg)    GEN: Well nourished, well groomed in no acute distress; Healthy appearing NECK: No JVD; No carotid bruits CARDIAC: RRR, Normal S1, S2; 1/6 SEM; No rubs, gallops RESPIRATORY:  Clear to auscultation without rales, wheezing or rhonchi ; nonlabored, good air movement. ABDOMEN: Soft, non-tender, non-distended EXTREMITIES:  No edema; No deformity ; abnormal gait    ASSESSMENT AND PLAN: .    Problem List Items Addressed This Visit       Cardiology Problems   Hyperlipidemia associated with type 2 diabetes mellitus (HCC) (Chronic)   LDL 38 back in October 2024.  Excellent control.  She is on 20 mg of rosuvastatin and tolerating it well.  Well-controlled with A1c of 5.5 in October.  Switched from [employer] to [employer] for additional weight loss. -Continue Mounjaro and monitor blood sugars. -Encourage continued weight loss efforts.        Relevant Medications   hydrALAZINE (APRESOLINE) 25 MG tablet   olmesartan (BENICAR) 20 MG tablet   Other Relevant Orders   EKG 12-Lead (Completed)   Labile hypertension (Chronic)   Blood pressure readings are variable, with some elevated readings likely due to pain. No syncope or dizziness reported. -Continue current antihypertensive regimen. -Use Hydralazine as needed for elevated readings, but avoid if blood pressure is low.      Relevant Medications   hydrALAZINE (APRESOLINE) 25 MG tablet   olmesartan (BENICAR) 20 MG tablet   Other Relevant Orders   EKG 12-Lead (Completed)   Orthostatic hypotension (Chronic)   Ongoing management  with blood pressure monitoring. -Continue current management and monitor for symptoms. 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 Simponi and Cimzia trials have not provided relief. Joint pain is severe and causing sleep disturbances. -Continue Cimzia 400mg  loading dose and monitor for efficacy. -Follow-up with Dr. Kathlen Mody for further management.  Pannus with recurrent infections Attempting to get insurance approval for surgical removal due to recurrent infections. -Continue to pursue insurance approval for surgical intervention.  General Health Maintenance -Continue Lasix as needed for fluid retention. -Return for follow-up  in 1 year.     Follow-Up: Return in about 1 year (around 07/26/2024) for 1 Yr Follow-up.  Total time spent: 21 min spent with patient + 15 min spent charting = 36 min     Signed, Marykay Lex, MD, MS Bryan Lemma, M.D., M.S. Interventional Cardiologist  [facility]  Pager # [phone] Phone # [phone] [address] [place], [place] [address]

## 2023-07-28 ENCOUNTER — Other Ambulatory Visit (HOSPITAL_BASED_OUTPATIENT_CLINIC_OR_DEPARTMENT_OTHER): Payer: Self-pay | Admitting: Obstetrics and Gynecology

## 2023-07-28 DIAGNOSIS — Z139 Encounter for screening, unspecified: Secondary | ICD-10-CM

## 2023-07-30 ENCOUNTER — Encounter: Payer: Self-pay | Admitting: Cardiology

## 2023-07-30 NOTE — Assessment & Plan Note (Addendum)
LDL 38 back in October 2024.  Excellent control.  She is on 20 mg of rosuvastatin and tolerating it well.  Well-controlled with A1c of 5.5 in October. Switched from [employer] to [employer] for additional weight loss. -Continue Mounjaro and monitor blood sugars. -Encourage continued weight loss efforts.

## 2023-07-30 NOTE — Assessment & Plan Note (Signed)
Blood pressure readings are variable, with some elevated readings likely due to pain. No syncope or dizziness reported. -Continue current antihypertensive regimen. -Use Hydralazine as needed for elevated readings, but avoid if blood pressure is low.

## 2023-07-30 NOTE — Assessment & Plan Note (Signed)
Ongoing management with blood pressure monitoring. -Continue current management and monitor for symptoms. -Maintain adequate hydration

## 2023-07-30 NOTE — Assessment & Plan Note (Signed)
Stable 1/6 SEM at [facility]

## 2023-08-02 ENCOUNTER — Other Ambulatory Visit: Payer: Self-pay

## 2023-08-02 ENCOUNTER — Other Ambulatory Visit (HOSPITAL_BASED_OUTPATIENT_CLINIC_OR_DEPARTMENT_OTHER): Payer: Self-pay

## 2023-08-02 DIAGNOSIS — L570 Actinic keratosis: Secondary | ICD-10-CM | POA: Diagnosis not present

## 2023-08-05 ENCOUNTER — Other Ambulatory Visit (HOSPITAL_BASED_OUTPATIENT_CLINIC_OR_DEPARTMENT_OTHER): Payer: Self-pay

## 2023-08-05 DIAGNOSIS — R748 Abnormal levels of other serum enzymes: Secondary | ICD-10-CM | POA: Diagnosis not present

## 2023-08-05 DIAGNOSIS — M1990 Unspecified osteoarthritis, unspecified site: Secondary | ICD-10-CM | POA: Diagnosis not present

## 2023-08-05 DIAGNOSIS — M797 Fibromyalgia: Secondary | ICD-10-CM | POA: Diagnosis not present

## 2023-08-05 DIAGNOSIS — M0609 Rheumatoid arthritis without rheumatoid factor, multiple sites: Secondary | ICD-10-CM | POA: Diagnosis not present

## 2023-08-05 DIAGNOSIS — M79643 Pain in unspecified hand: Secondary | ICD-10-CM | POA: Diagnosis not present

## 2023-08-05 DIAGNOSIS — M549 Dorsalgia, unspecified: Secondary | ICD-10-CM | POA: Diagnosis not present

## 2023-08-05 DIAGNOSIS — M2550 Pain in unspecified joint: Secondary | ICD-10-CM | POA: Diagnosis not present

## 2023-08-05 DIAGNOSIS — M8580 Other specified disorders of bone density and structure, unspecified site: Secondary | ICD-10-CM | POA: Diagnosis not present

## 2023-08-05 DIAGNOSIS — G8929 Other chronic pain: Secondary | ICD-10-CM | POA: Diagnosis not present

## 2023-08-05 DIAGNOSIS — Z79899 Other long term (current) drug therapy: Secondary | ICD-10-CM | POA: Diagnosis not present

## 2023-08-05 MED ORDER — HYDROXYCHLOROQUINE SULFATE 200 MG PO TABS: 400.0000 mg | ORAL_TABLET | Freq: Every day | ORAL | 0 refills | Status: DC

## 2023-08-08 ENCOUNTER — Inpatient Hospital Stay: Payer: Commercial Managed Care - PPO | Admitting: Family

## 2023-08-08 ENCOUNTER — Inpatient Hospital Stay: Payer: Commercial Managed Care - PPO

## 2023-08-09 DIAGNOSIS — M0609 Rheumatoid arthritis without rheumatoid factor, multiple sites: Secondary | ICD-10-CM | POA: Diagnosis not present

## 2023-08-16 ENCOUNTER — Other Ambulatory Visit (HOSPITAL_BASED_OUTPATIENT_CLINIC_OR_DEPARTMENT_OTHER): Payer: Self-pay

## 2023-08-16 ENCOUNTER — Encounter: Payer: Self-pay | Admitting: Family Medicine

## 2023-08-16 MED ORDER — TIRZEPATIDE 12.5 MG/0.5ML ~~LOC~~ SOAJ
12.5000 mg | SUBCUTANEOUS | 0 refills | Status: DC
Start: 2023-08-16 — End: 2023-09-16
  Filled 2023-08-16: qty 2, 28d supply, fill #0

## 2023-08-22 ENCOUNTER — Ambulatory Visit (HOSPITAL_BASED_OUTPATIENT_CLINIC_OR_DEPARTMENT_OTHER)
Admission: RE | Admit: 2023-08-22 | Discharge: 2023-08-22 | Disposition: A | Discharge status: Home / Self Care | Payer: Commercial Managed Care - PPO | Source: Ambulatory Visit | Attending: Obstetrics and Gynecology | Admitting: Obstetrics and Gynecology

## 2023-08-22 ENCOUNTER — Encounter (HOSPITAL_BASED_OUTPATIENT_CLINIC_OR_DEPARTMENT_OTHER): Payer: Self-pay

## 2023-08-22 DIAGNOSIS — Z1231 Encounter for screening mammogram for malignant neoplasm of breast: Secondary | ICD-10-CM | POA: Diagnosis not present

## 2023-08-22 DIAGNOSIS — Z139 Encounter for screening, unspecified: Secondary | ICD-10-CM | POA: Diagnosis present

## 2023-08-23 ENCOUNTER — Other Ambulatory Visit: Payer: Self-pay

## 2023-08-23 ENCOUNTER — Other Ambulatory Visit (HOSPITAL_BASED_OUTPATIENT_CLINIC_OR_DEPARTMENT_OTHER): Payer: Self-pay

## 2023-08-23 DIAGNOSIS — M1612 Unilateral primary osteoarthritis, left hip: Secondary | ICD-10-CM | POA: Diagnosis not present

## 2023-08-23 DIAGNOSIS — M4326 Fusion of spine, lumbar region: Secondary | ICD-10-CM | POA: Diagnosis not present

## 2023-08-23 DIAGNOSIS — M4726 Other spondylosis with radiculopathy, lumbar region: Secondary | ICD-10-CM | POA: Diagnosis not present

## 2023-08-23 MED ORDER — METHOCARBAMOL 750 MG PO TABS
750.0000 mg | ORAL_TABLET | Freq: Three times a day (TID) | ORAL | 0 refills | Status: DC
  Filled 2023-08-23: qty 180, 60d supply, fill #0

## 2023-08-23 MED ORDER — CYCLOBENZAPRINE HCL 10 MG PO TABS
10.0000 mg | ORAL_TABLET | Freq: Two times a day (BID) | ORAL | 0 refills | Status: DC | PRN
  Filled 2023-08-23: qty 180, 90d supply, fill #0

## 2023-08-23 MED ORDER — TRAMADOL HCL 50 MG PO TABS
50.0000 mg | ORAL_TABLET | Freq: Three times a day (TID) | ORAL | 1 refills | Status: DC | PRN
  Filled 2023-08-23: qty 90, 30d supply, fill #0

## 2023-09-01 ENCOUNTER — Other Ambulatory Visit (HOSPITAL_BASED_OUTPATIENT_CLINIC_OR_DEPARTMENT_OTHER): Payer: Self-pay

## 2023-09-01 ENCOUNTER — Other Ambulatory Visit: Payer: Self-pay | Admitting: Family Medicine

## 2023-09-01 DIAGNOSIS — E785 Hyperlipidemia, unspecified: Secondary | ICD-10-CM

## 2023-09-01 MED ORDER — ROSUVASTATIN CALCIUM 20 MG PO TABS
20.0000 mg | ORAL_TABLET | Freq: Every day | ORAL | 0 refills | Status: DC
  Filled 2023-09-01: qty 90, 90d supply, fill #0

## 2023-09-02 ENCOUNTER — Other Ambulatory Visit (HOSPITAL_BASED_OUTPATIENT_CLINIC_OR_DEPARTMENT_OTHER): Payer: Self-pay

## 2023-09-05 ENCOUNTER — Other Ambulatory Visit: Payer: Self-pay

## 2023-09-05 ENCOUNTER — Other Ambulatory Visit (HOSPITAL_BASED_OUTPATIENT_CLINIC_OR_DEPARTMENT_OTHER): Payer: Self-pay

## 2023-09-05 DIAGNOSIS — Z01419 Encounter for gynecological examination (general) (routine) without abnormal findings: Secondary | ICD-10-CM | POA: Diagnosis not present

## 2023-09-05 MED ORDER — PREMARIN 0.625 MG/GM VA CREA
1.0000 g | TOPICAL_CREAM | VAGINAL | 5 refills | Status: AC
  Filled 2023-09-05: qty 30, 70d supply, fill #0

## 2023-09-05 MED ORDER — TRIAMCINOLONE ACETONIDE 0.1 % EX OINT
1.0000 | TOPICAL_OINTMENT | Freq: Two times a day (BID) | CUTANEOUS | 2 refills | Status: AC
  Filled 2023-09-05: qty 30, 15d supply, fill #0
  Filled 2023-10-02: qty 30, 15d supply, fill #1
  Filled 2023-10-16: qty 30, 15d supply, fill #2

## 2023-09-05 MED ORDER — ESTRADIOL 0.1 MG/24HR TD PTTW
1.0000 | MEDICATED_PATCH | TRANSDERMAL | 4 refills | Status: DC
  Filled 2023-09-05 – 2023-09-10 (×2): qty 24, 84d supply, fill #0
  Filled 2023-12-02: qty 24, 84d supply, fill #1
  Filled 2024-03-01: qty 24, 84d supply, fill #2

## 2023-09-06 DIAGNOSIS — M0609 Rheumatoid arthritis without rheumatoid factor, multiple sites: Secondary | ICD-10-CM | POA: Diagnosis not present

## 2023-09-07 ENCOUNTER — Other Ambulatory Visit: Payer: Self-pay

## 2023-09-07 ENCOUNTER — Other Ambulatory Visit (HOSPITAL_BASED_OUTPATIENT_CLINIC_OR_DEPARTMENT_OTHER): Payer: Self-pay

## 2023-09-07 MED ORDER — PREGABALIN 150 MG PO CAPS
ORAL_CAPSULE | ORAL | 0 refills | Status: DC
  Filled 2023-09-07: qty 270, 90d supply, fill #0

## 2023-09-09 NOTE — Progress Notes (Signed)
 NEUROLOGY FOLLOW UP OFFICE NOTE  JANELI LEWISON [id number]  Assessment/Plan:   Migraine with/without aura, without status migrainosus, not intractable. Dizziness with episode of syncope.  Secondary to vasovagal/orthostatic hypotension - followed by cardiology    Migraine prevention:  Due to increase migraines, plan to change from Emgality to Nurtec every other day Migraine rescue:  Ubrelvy 100mg   Limit use of pain relievers to no more than 2 days out of week to prevent risk of rebound or medication-overuse headache. Keep headache diary Follow up 6 months.     Subjective:  Telford Nab. Wilz is a 68 year old female with fibromyalgia, RA, HTN, IBS, depression, type 2 diabetes and history of L4-S1 decompression and TIA who follows up for migraine and recurrent syncope   UPDATE: Pain from RA is a major trigger for her migraines.  Due to flare up in RA, migraines have increased. Intensity:  moderate to severe Duration:  2 hours with Bernita Raisin  Frequency:  average 1 every 2-3 days.     No recent episodes of syncope.   Current NSAIDS/analgesics:  Tylenol, tramadol Current triptans:  none Current ergotamine:  none Current anti-emetic:  Zofran 4mg  Current muscle relaxants:  Flexeril, Robaxin 500mg  Current Antihypertensive medications:  olmesartan, lasix Current Antidepressant medications:  Cymbalta 30mg  BID Current Anticonvulsant medications:  Lyrica 150mg  TID Current anti-CGRP:  Emgality, Ubrelvy 100mg  Current Vitamins/Herbal/Supplements:  B1, KCl, MVI Current Antihistamines/Decongestants:  none Other therapy:  none Hormone/birth control:  estradiol Other medications:  Plaquenil, Cimzia, Restoril     HISTORY:  In 2021, she was tried on different immunosuppressant therapies for her RA.  In addition to her RA pain, she has fibromyalgia, osteoarthritis, lumbosacral spondylosis and sacroiliac pain, for which she is managed by pain medicine.  She began experiencing daily headaches  in July 2021.  The following month, she began experiencing dizziness.  She was found to have a new murmur and was referred to cardiology.  Echocardiogram in August was normal with EF 60-65%.  Murmur was thought to be secondary to mild aortic valve sclerosis.  The dizziness occurs spontaneously and not positional.  She will experience severe spinning and lightheadedness lasting several minutes and occurring off and on during the day.  It may occur with the headaches but also by itself.  Headaches are described as moderate pressure in a cap distribution, sometimes associated with nausea, photophobia and phonophobia, sometimes flickers or spots in her vision.  She has a heavy foggy feeling in her head.  They last several hours, sometimes all day.  They may occur daily for a couple of weeks and then they may be episodic with one or two headache-free days in between.  On 07/09/2020, she had two syncopal spells that morning.  The first time, she walked into the kitchen and suddenly passed out.  She woke up and started walking out of the kitchen back to bed when she suddenly passed out again.  Witnessed by her daughter.  The events lasted about 30 seconds, no convulsions, tongue biting, palpitations or postictal confusion. However, she did have urinary incontinence.  Blood pressure was reportedly 92/52.  She is currently wearing a cardiac event monitor.  Carotid ultrasound on 07/30/2020 showed no hemodynamically significant ICA stenosis.  She went to the ED on 08/01/2020 for dizziness, left arm tingling and systolic BP 190s.  CT head personally reviewed was negative for acute abnormalities.  Basic labs and troponin were negative.  EKG showed new RBBB but nothing acute.  She later had  MRI and MRA of brain on 08/08/2020  showed mild bilateral mastoid effusions but otherwise unremarkable.  Since then, blood pressure has been elevated.  EEG on 09/03/2020 was normal.  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     She has remote history of migraines in 1999-2000.  They were severe headaches with diaphoresis, nausea, photophobia and phonophobia.  She was treated with propranolol for a time.  No history of seizures.     Past NSAIDS/analgesics:  none Past abortive triptans:  none Past abortive ergotamine:  none Past muscle relaxants:  none Past anti-emetic:  Reglan, Phenergan Past antihypertensive medications:  Lasix Past antidepressant medications:  Amitriptyline, nortriptyline, citalopram Past anticonvulsant medications:  Gabapentin Past anti-CGRP:  none Past vitamins/Herbal/Supplements:  none Past antihistamines/decongestants:  Claritin, meclizine Other past therapies:  none  PAST MEDICAL HISTORY: Past Medical History:  Diagnosis Date   Allergic rhinitis    Anxiety    Asthma    hx bronchial asthma at times with upper resp infection   Depression    Fibromyalgia    GERD (gastroesophageal reflux disease)    Headache(784.0)    migraine and cluster headaches   Hyperlipemia    Hypertension    IBS (irritable bowel syndrome)    Menopause    Neuromuscular disorder (HCC) 2009   hx of fibromyalgia, polyarthralsia (surgery induced)   Osteoarthritis    Rheumatoid arthritis (HCC)    Complicated by osteoarthritis as well.   Rheumatoid arthritis (HCC)    Stress incontinence    at times   Tibial fracture 10/14/2016   evulsion periostial right   Type 2 diabetes mellitus with complication, without long-term current use of insulin (HCC)    diet controlled no meds -> however was diagnosed with a diabetic foot ulcer.    MEDICATIONS: Current Outpatient Medications on File Prior to Visit  Medication Sig Dispense Refill   acetaminophen (TYLENOL) 650 MG CR tablet Take by mouth.     Cholecalciferol 125 MCG (5000 UT) TABS take 1 tablet by oral route every day     conjugated estrogens (PREMARIN) vaginal cream Place 0.5 Applicatorfuls vaginally 3 (three)  times a week. 30 g 5   conjugated estrogens (PREMARIN) vaginal cream Place 0.5 Applicatorfuls (1 gram) vaginally 3 (three) times a week. 30 g 5   Cyanocobalamin 3000 MCG/ML LIQD as directed Sublingual     cyclobenzaprine (FLEXERIL) 10 MG tablet Take 1 tablet (10 mg total) by mouth every 12 (twelve) hours as needed. 180 tablet 0   cyclobenzaprine (FLEXERIL) 10 MG tablet Take 1 tablet (10 mg total) by mouth every 12 (twelve) hours as needed. 180 tablet 0   diclofenac Sodium (VOLTAREN) 1 % GEL Apply 4 g topically 4 (four) times daily. 400 g 6   DULoxetine (CYMBALTA) 30 MG capsule Take 1 capsule (30 mg total) by mouth 2 (two) times daily. 180 capsule 1   Efinaconazole (JUBLIA) 10 % SOLN APPLY TO AFFECTED TOENAILS ONCE DAILY FOR 48 WEEKS 8 mL 11   Emollient (DERMEND BRUISE FORMULA) CREA as directed Externally     estradiol (DOTTI) 0.1 MG/24HR patch Apply 1 patch twice a week by transdermal route. 24 patch 4   estradiol (DOTTI) 0.1 MG/24HR patch Place 1 patch (0.1 mg total) onto the skin 2 (two) times a week. 24 patch 4   ferric derisomaltose (MONOFERRIC) 1000 MG/10ML SOLN injection as directed Intravenous     fluconazole (DIFLUCAN) 150 MG tablet Take 1 tablet (  150 mg total) by mouth every 3 (three) days. Repeat if needed 2 tablet 0   furosemide (LASIX) 20 MG tablet Take 1 tablet (20 mg total) by mouth every morning. 90 tablet 3   Galcanezumab-gnlm (EMGALITY) 120 MG/ML SOAJ INJECT 120 MG INTO THE SKIN EVERY 28 (TWENTY-EIGHT) DAYS. 1 mL 11   glucose blood test strip Test blood sugar once daily. Dx Code: E11.9 100 each 12   hydrALAZINE (APRESOLINE) 25 MG tablet Take 1 tablet (25 mg total) by mouth 2 (two) times daily. Hold if feeling dizzy or SBP<110 mmHg).  Can use additional 25 mg as needed for SBP>150 mmHg 190 tablet 3   hydroxychloroquine (PLAQUENIL) 200 MG tablet Take 2 tablets (400 mg total) by mouth once daily. 180 tablet 0   Hyoscyamine Sulfate SL (LEVSIN/SL) 0.125 MG SUBL Place 1 tablet under the  tongue every 6 (six) hours as needed. 30 tablet 0   methocarbamol (ROBAXIN) 750 MG tablet Take 1 tablet (750 mg total) by mouth every 8 (eight) hours. 180 tablet 0   methocarbamol (ROBAXIN) 750 MG tablet Take 1 tablet (750 mg total) by mouth every 8 (eight) hours. 180 tablet 0   Multiple Vitamins-Minerals (CENTRUM ADULTS) TABS take 2 tablets by oral route  every day with food     mupirocin ointment (BACTROBAN) 2 % Apply to toes as needed 22 g 1   nystatin (MYCOSTATIN/NYSTOP) powder Apply 1 Application topically 3 (three) times daily. 15 g 5   ofloxacin (FLOXIN) 0.3 % OTIC solution Place 10 drops into both ears daily. 5 mL 0   olmesartan (BENICAR) 20 MG tablet Take 1 tablet (20 mg total) by mouth daily with supper. 90 tablet 3   ondansetron (ZOFRAN) 4 MG tablet Take 1 tablet (4 mg total) by mouth every 8 (eight) hours as needed for nausea or vomiting. 20 tablet 0   pantoprazole (PROTONIX) 40 MG tablet Take 1 tablet (40 mg total) by mouth daily. 90 tablet 3   potassium chloride (KLOR-CON M) 10 MEQ tablet Take 1 tablet (10 mEq total) by mouth daily. 90 tablet 1   pregabalin (LYRICA) 150 MG capsule Take one capsule (150 mg dose) by mouth 3 (three) times a day. Max Daily Amount: 450 mg 90 capsule 2   pregabalin (LYRICA) 150 MG capsule Take one capsule (150 mg dose) by mouth 3 (three) times a day. Max Daily Amount: 450 mg 270 capsule 0   rosuvastatin (CRESTOR) 20 MG tablet Take 1 tablet (20 mg total) by mouth daily. 90 tablet 0   temazepam (RESTORIL) 30 MG capsule Take 1 capsule (30 mg total) by mouth at bedtime as needed for sleep. 90 capsule 1   thiamine (VITAMIN B-1) 100 MG tablet Take 100 mg daily by mouth.     tirzepatide (MOUNJARO) 12.5 MG/0.5ML Pen Inject 12.5 mg into the skin once a week. 2 mL 0   traMADol (ULTRAM) 50 MG tablet Take 1 tablet (50 mg total) by mouth every 8 (eight) hours as needed. 90 tablet 0   traMADol (ULTRAM) 50 MG tablet Take 1 tablet (50 mg total) by mouth every 8 (eight)  hours as needed. 90 tablet 1   triamcinolone ointment (KENALOG) 0.1 % Apply 1 Application (a thin layer) topically in the morning and at bedtime. 30 g 2   triamcinolone ointment (KENALOG) 0.1 % APPLY A THIN LAYER TO THE AFFECTED AREA(S) BY TOPICAL ROUTE 2 TIMES PER DAY 30 g 2   UBRELVY 100 MG TABS TAKE ONE TABLET BY  MOUTH DAILY AS NEEDED FOR MIGRAINE. MAX 2 TABLETS IN 24 HOURS 16 tablet 5   No current facility-administered medications on file prior to visit.     ALLERGIES: Allergies  Allergen Reactions   Gabapentin Swelling    Pt states she has eye swelling    Isoniazid     Severe SOB   Morphine Hives and Nausea And Vomiting    Allergic to PCA pump only    Nitrofurantoin Shortness Of Breath, Rash and Other (See Comments)    REACTION: welts   Tamiflu [Oseltamivir Phosphate] Nausea And Vomiting    "I vomited within 30 minutes of taking it and was told I cannot ever take it again."   Hctz [Hydrochlorothiazide] Rash   Macrolides And Ketolides Rash   Promethazine Other (See Comments)    Other Reaction(s): Psychosis  IF GIVEN  IV-hallucinations     CAN TAKE PO PHENERGAN, , Other reaction(s): Hallucinations, IF GIVEN  IV-hallucinations     CAN TAKE PO PHENERGAN, Other reaction(s): Unknown   Wound Dressing Adhesive Rash    Adhesive Tape    Fentanyl Hives, Itching and Rash    REACTION: welts   Sulfamethoxazole-Trimethoprim Rash    FAMILY HISTORY: Family History  Problem Relation Age of Onset   Diabetes Mother    Stroke Mother    Breast cancer Mother    Atrial fibrillation Mother    Hypertension Mother    Cancer Mother 33       breast   Diabetes Father    Stroke Father 43       2nd 6 month apart   Hypertension Father    Heart attack Father 16       stents   Dementia Father    AAA (abdominal aortic aneurysm) Father 46       repair   CAD Father 63   Thyroid disease Other    Heart disease Other    COPD Other       Objective:  Blood pressure (!) 102/45, pulse 80,  height 5\' 9"  (1.753 m), weight 194 lb (88 kg), SpO2 99%. General: No acute distress.  Patient appears well-groomed.      Shon Millet, DO  CC: Seabron Spates, DO

## 2023-09-12 ENCOUNTER — Telehealth: Payer: Self-pay | Admitting: Family Medicine

## 2023-09-12 ENCOUNTER — Inpatient Hospital Stay: Payer: Commercial Managed Care - PPO | Attending: Family

## 2023-09-12 ENCOUNTER — Encounter: Payer: Self-pay | Admitting: Medical Oncology

## 2023-09-12 ENCOUNTER — Other Ambulatory Visit (HOSPITAL_BASED_OUTPATIENT_CLINIC_OR_DEPARTMENT_OTHER): Payer: Self-pay

## 2023-09-12 ENCOUNTER — Inpatient Hospital Stay: Payer: Commercial Managed Care - PPO | Admitting: Medical Oncology

## 2023-09-12 VITALS — BP 112/55 | HR 79 | Temp 98.2°F | Resp 20 | Ht 69.0 in | Wt 191.0 lb

## 2023-09-12 DIAGNOSIS — M0579 Rheumatoid arthritis with rheumatoid factor of multiple sites without organ or systems involvement: Secondary | ICD-10-CM | POA: Diagnosis not present

## 2023-09-12 DIAGNOSIS — Z9884 Bariatric surgery status: Secondary | ICD-10-CM | POA: Insufficient documentation

## 2023-09-12 DIAGNOSIS — E785 Hyperlipidemia, unspecified: Secondary | ICD-10-CM

## 2023-09-12 DIAGNOSIS — D500 Iron deficiency anemia secondary to blood loss (chronic): Secondary | ICD-10-CM

## 2023-09-12 DIAGNOSIS — K912 Postsurgical malabsorption, not elsewhere classified: Secondary | ICD-10-CM | POA: Insufficient documentation

## 2023-09-12 DIAGNOSIS — D508 Other iron deficiency anemias: Secondary | ICD-10-CM | POA: Diagnosis not present

## 2023-09-12 DIAGNOSIS — R0989 Other specified symptoms and signs involving the circulatory and respiratory systems: Secondary | ICD-10-CM

## 2023-09-12 LAB — CMP (CANCER CENTER ONLY)
ALT: 28 U/L (ref 0–44)
AST: 35 U/L (ref 15–41)
Albumin: 4.1 g/dL (ref 3.5–5.0)
Alkaline Phosphatase: 65 U/L (ref 38–126)
Anion gap: 6 (ref 5–15)
BUN: 13 mg/dL (ref 8–23)
CO2: 35 mmol/L — ABNORMAL HIGH (ref 22–32)
Calcium: 9.6 mg/dL (ref 8.9–10.3)
Chloride: 101 mmol/L (ref 98–111)
Creatinine: 0.88 mg/dL (ref 0.44–1.00)
GFR, Estimated: >60 mL/min (ref >60)
Glucose, Bld: 89 mg/dL (ref 70–99)
Potassium: 4.1 mmol/L (ref 3.5–5.1)
Sodium: 142 mmol/L (ref 135–145)
Total Bilirubin: 0.6 mg/dL (ref 0.0–1.2)
Total Protein: 7.1 g/dL (ref 6.5–8.1)

## 2023-09-12 LAB — CBC WITH DIFFERENTIAL (CANCER CENTER ONLY)
Abs Immature Granulocytes: 0.04 10*3/uL (ref 0.00–0.07)
Basophils Absolute: 0.1 10*3/uL (ref 0.0–0.1)
Basophils Relative: 1 %
Eosinophils Absolute: 0.1 10*3/uL (ref 0.0–0.5)
Eosinophils Relative: 1 %
HCT: 40.9 % (ref 36.0–46.0)
Hemoglobin: 13.1 g/dL (ref 12.0–15.0)
Immature Granulocytes: 1 %
Lymphocytes Relative: 28 %
Lymphs Abs: 1.9 10*3/uL (ref 0.7–4.0)
MCH: 29.6 pg (ref 26.0–34.0)
MCHC: 32 g/dL (ref 30.0–36.0)
MCV: 92.5 fL (ref 80.0–100.0)
Monocytes Absolute: 0.7 10*3/uL (ref 0.1–1.0)
Monocytes Relative: 10 %
Neutro Abs: 3.9 10*3/uL (ref 1.7–7.7)
Neutrophils Relative %: 59 %
Platelet Count: 173 10*3/uL (ref 150–400)
RBC: 4.42 MIL/uL (ref 3.87–5.11)
RDW: 12.7 % (ref 11.5–15.5)
WBC Count: 6.6 10*3/uL (ref 4.0–10.5)
nRBC: 0 % (ref 0.0–0.2)

## 2023-09-12 LAB — RETICULOCYTES
Immature Retic Fract: 9.9 % (ref 2.3–15.9)
RBC.: 4.42 MIL/uL (ref 3.87–5.11)
Retic Count, Absolute: 71.2 10*3/uL (ref 19.0–186.0)
Retic Ct Pct: 1.6 % (ref 0.4–3.1)

## 2023-09-12 LAB — FERRITIN: Ferritin: 109 ng/mL (ref 11–307)

## 2023-09-12 LAB — IRON AND IRON BINDING CAPACITY (CC-WL,HP ONLY)
Iron: 80 ug/dL (ref 28–170)
Saturation Ratios: 26 % (ref 10.4–31.8)
TIBC: 307 ug/dL (ref 250–450)
UIBC: 227 ug/dL (ref 148–442)

## 2023-09-12 NOTE — Progress Notes (Signed)
 Hematology and Oncology Follow Up Visit  [name] [id number] Jun 20, 1956 68 y.o. 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    [name] states that [name] has been ok since her last visit. Her biggest issue to her is her RA.   [name] continues on the IV plaquenil along with Cimzia 400 mg injection once month for her RA. [name] states that her RA is acting up a bit- [name] is on the new Cimzia but has not been on it for 3 months yet so [name] does not know how well it will work for her. [name] has been on the Cimzia for one month.   When we  last saw her back in October, her ferritin was 86.  Her iron saturation is 21%.  [name] has had no bleeding.  There is no change in bowel or bladder habits.  [name] has had no rashes.  [name] has had no leg swelling.  [name] has had no bleeding.  Overall, I would say performance status is probably ECOG 1.   Wt Readings from Last 3 Encounters:  09/12/23 191 lb 0.6 oz (86.7 kg)  07/27/23 195 lb (88.5 kg)  06/07/23 197 lb (89.4 kg)   Medications:  Allergies as of 09/12/2023       Reactions   Gabapentin Swelling   Pt states [name] has eye swelling    Isoniazid Shortness Of Breath   Morphine Hives, Nausea And Vomiting   Allergic to PCA pump only   Nitrofurantoin Shortness Of Breath, Rash, Other (See Comments)   REACTION: welts   Tamiflu [oseltamivir Phosphate] Nausea And Vomiting   "I vomited within 30 minutes of taking it and was told I cannot ever take it again."   Hctz [hydrochlorothiazide] Rash   Macrolides And Ketolides Rash   Promethazine Other (See Comments)   Other Reaction(s): Psychosis IF GIVEN  IV-hallucinations     CAN TAKE PO PHENERGAN, , Other reaction(s): Hallucinations, IF GIVEN  IV-hallucinations     CAN TAKE PO PHENERGAN, Other  reaction(s): Unknown   Wound Dressing Adhesive Rash   Adhesive Tape    Fentanyl Hives, Itching, Rash   REACTION: welts   Sulfamethoxazole-trimethoprim Rash        Medication List        Accurate as of September 12, 2023  9:25 AM. If you have any questions, ask your nurse or doctor.          STOP taking these medications    fluconazole 150 MG tablet Commonly known as: DIFLUCAN Stopped by: Brand Males Dyani Babel   Jublia 10 % Soln Generic drug: Efinaconazole Stopped by: Rushie Chestnut       TAKE these medications    acetaminophen 650 MG CR tablet Commonly known as: TYLENOL Take by mouth.   Centrum Adults Tabs take 2 tablets by oral route  every day with food   Cholecalciferol 125 MCG (5000 UT) Tabs take 1 tablet by oral route every day   Cyanocobalamin 3000 MCG/ML Liqd as directed Sublingual   cyclobenzaprine 10 MG tablet Commonly known as: FLEXERIL Take 1 tablet (10 mg total) by mouth every 12 (twelve) hours as needed. What changed: Another medication with the same name was removed. Continue taking this medication, and follow  the directions you see here. Changed by: Rushie Chestnut   DerMend Bruise Formula Crea as directed Externally   diclofenac Sodium 1 % Gel Commonly known as: VOLTAREN Apply 4 g topically 4 (four) times daily.   Dotti 0.1 MG/24HR patch Generic drug: estradiol Apply 1 patch twice a week by transdermal route.   estradiol 0.1 MG/24HR patch Commonly known as: Dotti Place 1 patch (0.1 mg total) onto the skin 2 (two) times a week.   DULoxetine 30 MG capsule Commonly known as: CYMBALTA Take 1 capsule (30 mg total) by mouth 2 (two) times daily.   Emgality 120 MG/ML Soaj Generic drug: Galcanezumab-gnlm INJECT 120 MG INTO THE SKIN EVERY 28 (TWENTY-EIGHT) DAYS.   furosemide 20 MG tablet Commonly known as: LASIX Take 1 tablet (20 mg total) by mouth every morning.   glucose blood test strip Test blood sugar once daily. Dx Code:  E11.9   hydrALAZINE 25 MG tablet Commonly known as: APRESOLINE Take 1 tablet (25 mg total) by mouth 2 (two) times daily. Hold if feeling dizzy or SBP<110 mmHg).  Can use additional 25 mg as needed for SBP>150 mmHg   hydroxychloroquine 200 MG tablet Commonly known as: PLAQUENIL Take 2 tablets (400 mg total) by mouth once daily.   methocarbamol 750 MG tablet Commonly known as: ROBAXIN Take 1 tablet (750 mg total) by mouth every 8 (eight) hours. What changed: Another medication with the same name was removed. Continue taking this medication, and follow the directions you see here. Changed by: Rushie Chestnut   Monoferric 1000 MG/10ML Soln injection Generic drug: ferric derisomaltose as directed Intravenous   Mounjaro 12.5 MG/0.5ML Pen Generic drug: tirzepatide Inject 12.5 mg into the skin once a week.   mupirocin ointment 2 % Commonly known as: BACTROBAN Apply to toes as needed   nystatin powder Commonly known as: MYCOSTATIN/NYSTOP Apply 1 Application topically 3 (three) times daily.   ofloxacin 0.3 % OTIC solution Commonly known as: FLOXIN Place 10 drops into both ears daily.   olmesartan 20 MG tablet Commonly known as: BENICAR Take 1 tablet (20 mg total) by mouth daily with supper.   ondansetron 4 MG tablet Commonly known as: Zofran Take 1 tablet (4 mg total) by mouth every 8 (eight) hours as needed for nausea or vomiting.   Oscimin 0.125 MG SL tablet Generic drug: hyoscyamine Place 1 tablet under the tongue every 6 (six) hours as needed.   pantoprazole 40 MG tablet Commonly known as: PROTONIX Take 1 tablet (40 mg total) by mouth daily.   potassium chloride 10 MEQ tablet Commonly known as: KLOR-CON M Take 1 tablet (10 mEq total) by mouth daily.   pregabalin 150 MG capsule Commonly known as: LYRICA Take one capsule (150 mg dose) by mouth 3 (three) times a day. Max Daily Amount: 450 mg   pregabalin 150 MG capsule Commonly known as: LYRICA Take one capsule  (150 mg dose) by mouth 3 (three) times a day. Max Daily Amount: 450 mg   Premarin vaginal cream Generic drug: conjugated estrogens Place 0.5 Applicatorfuls (1 gram) vaginally 3 (three) times a week. What changed: Another medication with the same name was removed. Continue taking this medication, and follow the directions you see here. Changed by: Rushie Chestnut   rosuvastatin 20 MG tablet Commonly known as: CRESTOR Take 1 tablet (20 mg total) by mouth daily.   temazepam 30 MG capsule Commonly known as: RESTORIL Take 1 capsule (30 mg total) by mouth at bedtime as needed  for sleep.   thiamine 100 MG tablet Commonly known as: Vitamin B-1 Take 100 mg daily by mouth.   traMADol 50 MG tablet Commonly known as: ULTRAM Take 1 tablet (50 mg total) by mouth every 8 (eight) hours as needed. What changed: Another medication with the same name was removed. Continue taking this medication, and follow the directions you see here. Changed by: Rushie Chestnut   triamcinolone ointment 0.1 % Commonly known as: KENALOG Apply 1 Application (a thin layer) topically in the morning and at bedtime.   triamcinolone ointment 0.1 % Commonly known as: KENALOG APPLY A THIN LAYER TO THE AFFECTED AREA(S) BY TOPICAL ROUTE 2 TIMES PER DAY   Ubrelvy 100 MG Tabs Generic drug: Ubrogepant TAKE ONE TABLET BY MOUTH DAILY AS NEEDED FOR MIGRAINE. MAX 2 TABLETS IN 24 HOURS        Allergies:  Allergies  Allergen Reactions   Gabapentin Swelling    Pt states [name] has eye swelling    Isoniazid Shortness Of Breath   Morphine Hives and Nausea And Vomiting    Allergic to PCA pump only    Nitrofurantoin Shortness Of Breath, Rash and Other (See Comments)    REACTION: welts   Tamiflu [Oseltamivir Phosphate] Nausea And Vomiting    "I vomited within 30 minutes of taking it and was told I cannot ever take it again."   Hctz [Hydrochlorothiazide] Rash   Macrolides And Ketolides Rash   Promethazine Other (See  Comments)    Other Reaction(s): Psychosis  IF GIVEN  IV-hallucinations     CAN TAKE PO PHENERGAN, , Other reaction(s): Hallucinations, IF GIVEN  IV-hallucinations     CAN TAKE PO PHENERGAN, Other reaction(s): Unknown   Wound Dressing Adhesive Rash    Adhesive Tape    Fentanyl Hives, Itching and Rash    REACTION: welts   Sulfamethoxazole-Trimethoprim Rash    Past Medical History, Surgical history, Social history, and Family History were reviewed and updated.  Review of Systems: Review of Systems  Constitutional:  Positive for malaise/fatigue.  HENT: Negative.    Eyes: Negative.   Respiratory: Negative.    Cardiovascular: Negative.   Gastrointestinal: Negative.   Genitourinary: Negative.   Musculoskeletal:  Positive for joint pain and myalgias.  Skin: Negative.   Neurological: Negative.   Endo/Heme/Allergies: Negative.   Psychiatric/Behavioral: Negative.       Physical Exam: Vitals:   09/12/23 0919  BP: (!) 112/55  Pulse: 79  Resp: 20  Temp: 98.2 F (36.8 C)  SpO2: 100%     Wt Readings from Last 3 Encounters:  09/12/23 191 lb 0.6 oz (86.7 kg)  07/27/23 195 lb (88.5 kg)  06/07/23 197 lb (89.4 kg)    Physical Exam Vitals reviewed.  HENT:     Head: Normocephalic and atraumatic.  Eyes:     Pupils: Pupils are equal, round, and reactive to light.  Cardiovascular:     Rate and Rhythm: Normal rate and regular rhythm.     Heart sounds: Normal heart sounds.  Pulmonary:     Effort: Pulmonary effort is normal.     Breath sounds: Normal breath sounds.  Abdominal:     General: Bowel sounds are normal.     Palpations: Abdomen is soft.  Musculoskeletal:        General: No tenderness or deformity. Normal range of motion.     Cervical back: Normal range of motion.  Lymphadenopathy:     Cervical: No cervical adenopathy.  Skin:    General:  Skin is warm and dry.     Findings: No erythema or rash.  Neurological:     Mental Status: [name] is alert and oriented to person,  place, and time.  Psychiatric:        Behavior: Behavior normal.        Thought Content: Thought content normal.        Judgment: Judgment normal.     Lab Results  Component Value Date   WBC 6.6 09/12/2023   HGB 13.1 09/12/2023   HCT 40.9 09/12/2023   MCV 92.5 09/12/2023   PLT 173 09/12/2023   Lab Results  Component Value Date   FERRITIN 86 04/25/2023   IRON 71 04/25/2023   TIBC 344 04/25/2023   UIBC 273 04/25/2023   IRONPCTSAT 21 04/25/2023   Lab Results  Component Value Date   RETICCTPCT 1.6 09/12/2023   RBC 4.42 09/12/2023   RBC 4.42 09/12/2023   No results found for: "KPAFRELGTCHN", "LAMBDASER", "KAPLAMBRATIO" No results found for: "IGGSERUM", "IGA", "IGMSERUM" No results found for: "TOTALPROTELP", "ALBUMINELP", "A1GS", "A2GS", "BETS", "BETA2SER", "GAMS", "MSPIKE", "SPEI"   Chemistry      Component Value Date/Time   NA 142 09/12/2023 0845   NA 141 12/10/2022 0000   K 4.1 09/12/2023 0845   CL 101 09/12/2023 0845   CO2 35 (H) 09/12/2023 0845   BUN 13 09/12/2023 0845   BUN 9 12/10/2022 0000   CREATININE 0.88 09/12/2023 0845   GLU 88 12/10/2022 0000      Component Value Date/Time   CALCIUM 9.6 09/12/2023 0845   ALKPHOS 65 09/12/2023 0845   AST 35 09/12/2023 0845   ALT 28 09/12/2023 0845   BILITOT 0.6 09/12/2023 0845     Encounter Diagnoses  Name Primary?   Iron deficiency anemia due to chronic blood loss Yes   S/P gastric bypass    Rheumatoid arthritis involving multiple sites with positive rheumatoid factor (HCC)     Impression and Plan: [name] is a very pleasant 68 yo caucasian female with anemia secondary to malabsorption s/p gastric bypass in 2018.   Today CBC is normal Reticulocytes are normal Iron studied pending- will replace if needed  RTC 6 months APP, labs (CBC, CMP, retic, ferritin, iron)  Rushie Chestnut, PA-C 3/3/20259:25 AM

## 2023-09-12 NOTE — Telephone Encounter (Signed)
 Pt dropped off copy of lab results for provider to have and to put on patient's chart. Document in a small white envelope. Document put at front office tray under providers name.

## 2023-09-13 ENCOUNTER — Ambulatory Visit: Payer: Commercial Managed Care - PPO | Admitting: Neurology

## 2023-09-13 ENCOUNTER — Encounter: Payer: Self-pay | Admitting: Neurology

## 2023-09-13 ENCOUNTER — Other Ambulatory Visit (HOSPITAL_BASED_OUTPATIENT_CLINIC_OR_DEPARTMENT_OTHER): Payer: Self-pay

## 2023-09-13 DIAGNOSIS — G43009 Migraine without aura, not intractable, without status migrainosus: Secondary | ICD-10-CM | POA: Diagnosis not present

## 2023-09-13 DIAGNOSIS — G43019 Migraine without aura, intractable, without status migrainosus: Secondary | ICD-10-CM

## 2023-09-13 MED ORDER — UBRELVY 100 MG PO TABS: ORAL_TABLET | ORAL | 5 refills | Status: DC

## 2023-09-13 MED ORDER — NURTEC 75 MG PO TBDP
1.0000 | ORAL_TABLET | ORAL | 11 refills | Status: AC
  Filled 2023-09-13: qty 16, 30d supply, fill #0
  Filled 2023-10-16: qty 16, 30d supply, fill #1
  Filled 2023-11-22: qty 16, 30d supply, fill #2
  Filled 2023-12-27: qty 16, 30d supply, fill #3
  Filled 2024-01-25: qty 16, 30d supply, fill #4
  Filled 2024-02-20: qty 16, 30d supply, fill #5
  Filled 2024-03-25: qty 16, 30d supply, fill #6
  Filled 2024-05-16: qty 16, 30d supply, fill #7
  Filled 2024-07-01: qty 16, 30d supply, fill #8

## 2023-09-13 MED ORDER — EMGALITY 120 MG/ML ~~LOC~~ SOAJ
SUBCUTANEOUS | 11 refills | Status: DC
  Filled 2023-09-13: qty 1, fill #0

## 2023-09-13 NOTE — Patient Instructions (Signed)
 Plan to stop Emgality and change to Nurtec 1 tablet EVERY OTHER DAY Continue Ubrelvy as needed

## 2023-09-14 ENCOUNTER — Other Ambulatory Visit: Payer: Self-pay

## 2023-09-14 ENCOUNTER — Other Ambulatory Visit (HOSPITAL_BASED_OUTPATIENT_CLINIC_OR_DEPARTMENT_OTHER): Payer: Self-pay

## 2023-09-14 NOTE — Telephone Encounter (Signed)
Received. Placed in folder for review

## 2023-09-15 ENCOUNTER — Telehealth: Payer: Self-pay

## 2023-09-15 ENCOUNTER — Other Ambulatory Visit (HOSPITAL_BASED_OUTPATIENT_CLINIC_OR_DEPARTMENT_OTHER): Payer: Self-pay

## 2023-09-15 NOTE — Telephone Encounter (Signed)
 PA needed for Nurtec every other day.

## 2023-09-16 ENCOUNTER — Other Ambulatory Visit: Payer: Self-pay | Admitting: Family Medicine

## 2023-09-16 ENCOUNTER — Other Ambulatory Visit (HOSPITAL_BASED_OUTPATIENT_CLINIC_OR_DEPARTMENT_OTHER): Payer: Self-pay

## 2023-09-16 ENCOUNTER — Encounter: Payer: Self-pay | Admitting: Family Medicine

## 2023-09-16 MED ORDER — MOUNJARO 12.5 MG/0.5ML ~~LOC~~ SOAJ
12.5000 mg | SUBCUTANEOUS | 0 refills | Status: DC
  Filled 2023-09-16: qty 2, 28d supply, fill #0

## 2023-09-19 ENCOUNTER — Other Ambulatory Visit (HOSPITAL_BASED_OUTPATIENT_CLINIC_OR_DEPARTMENT_OTHER): Payer: Self-pay

## 2023-09-20 ENCOUNTER — Other Ambulatory Visit: Payer: Self-pay

## 2023-09-20 ENCOUNTER — Other Ambulatory Visit (HOSPITAL_BASED_OUTPATIENT_CLINIC_OR_DEPARTMENT_OTHER): Payer: Self-pay

## 2023-09-21 ENCOUNTER — Encounter: Payer: Self-pay | Admitting: Podiatry

## 2023-09-21 ENCOUNTER — Ambulatory Visit (INDEPENDENT_AMBULATORY_CARE_PROVIDER_SITE_OTHER): Payer: Commercial Managed Care - PPO | Admitting: Podiatry

## 2023-09-21 ENCOUNTER — Other Ambulatory Visit (HOSPITAL_BASED_OUTPATIENT_CLINIC_OR_DEPARTMENT_OTHER): Payer: Self-pay

## 2023-09-21 ENCOUNTER — Telehealth: Payer: Self-pay

## 2023-09-21 ENCOUNTER — Telehealth: Payer: Self-pay | Admitting: Pharmacist

## 2023-09-21 ENCOUNTER — Other Ambulatory Visit: Payer: Self-pay

## 2023-09-21 VITALS — Ht 69.0 in | Wt 194.0 lb

## 2023-09-21 DIAGNOSIS — M79675 Pain in left toe(s): Secondary | ICD-10-CM | POA: Diagnosis not present

## 2023-09-21 DIAGNOSIS — E1142 Type 2 diabetes mellitus with diabetic polyneuropathy: Secondary | ICD-10-CM

## 2023-09-21 DIAGNOSIS — B351 Tinea unguium: Secondary | ICD-10-CM

## 2023-09-21 DIAGNOSIS — S90416A Abrasion, unspecified lesser toe(s), initial encounter: Secondary | ICD-10-CM

## 2023-09-21 DIAGNOSIS — M79674 Pain in right toe(s): Secondary | ICD-10-CM

## 2023-09-21 NOTE — Telephone Encounter (Signed)
PA needed for ubrelvy 100 mg.

## 2023-09-21 NOTE — Telephone Encounter (Signed)
 Pharmacy Patient Advocate Encounter   Received notification from Physician's Office that prior authorization for Nurtec 75MG  dispersible tablets is required/requested.   Insurance verification completed.   The patient is insured through [facility] .   Per test claim: PA required; PA submitted to above mentioned insurance via CoverMyMeds Key/confirmation #/EOC [id number] Status is pending

## 2023-09-22 NOTE — Telephone Encounter (Signed)
 Pharmacy Patient Advocate Encounter  Received notification from [facility] that Prior Authorization for Nurtec 75MG  dispersible tablets has been APPROVED from 09/21/2023 to 03/23/2024   PA #/Case ID/Reference #: [id number]

## 2023-09-23 ENCOUNTER — Encounter: Payer: Self-pay | Admitting: Neurology

## 2023-09-25 NOTE — Progress Notes (Signed)
 Subjective:  Patient ID: [name], female    DOB: Jun 09, 1956,  MRN: [id number]  68 y.o. female presents to clinic with  at risk foot care with history of diabetic neuropathy and painful, elongated thickened toenails x 10 which are symptomatic when wearing enclosed shoe gear. This interferes with his/her daily activities. Patient states she stubbed her toes on steps and has abrasions on several toes. She has been applying Mupirocin Ointment to the toes and states they are healing. Chief Complaint  Patient presents with   Nail Problem    Pt is here for [facility] last A1C was 5.5 PCP is Dr Laury Axon and LOV was in October.     New problem(s): None   PCP is Zola Button, [place] R, DO.  Allergies  Allergen Reactions   Gabapentin Swelling    Pt states she has eye swelling    Isoniazid Shortness Of Breath   Morphine Hives and Nausea And Vomiting    Allergic to PCA pump only    Nitrofurantoin Shortness Of Breath, Rash and Other (See Comments)    REACTION: welts   Tamiflu [Oseltamivir Phosphate] Nausea And Vomiting    "I vomited within 30 minutes of taking it and was told I cannot ever take it again."   Hctz [Hydrochlorothiazide] Rash   Macrolides And Ketolides Rash   Promethazine Other (See Comments)    Other Reaction(s): Psychosis  IF GIVEN  IV-hallucinations     CAN TAKE PO PHENERGAN, , Other reaction(s): Hallucinations, IF GIVEN  IV-hallucinations     CAN TAKE PO PHENERGAN, Other reaction(s): Unknown   Wound Dressing Adhesive Rash    Adhesive Tape    Fentanyl Hives, Itching and Rash    REACTION: welts   Sulfamethoxazole-Trimethoprim Rash    Review of Systems: Negative except as noted in the HPI.   Objective:  [name] is a pleasant 68 y.o. female in NAD. AAO x 3.  Vascular Examination: Vascular status intact b/l with palpable pedal pulses. CFT immediate b/l. No edema. No pain with calf compression b/l. Skin temperature gradient WNL b/l. Pedal hair sparse.  Neurological  Examination: Sensation grossly intact b/l with 10 gram monofilament. Vibratory sensation intact b/l. Pt has subjective symptoms of neuropathy.  Dermatological Examination: Pedal skin with normal turgor, texture and tone b/l. Toenails 1-5 b/l thick, discolored, elongated with subungual debris and pain on dorsal palpation. No hyperkeratotic lesions noted b/l. Healing abrasion(s) with intact scab noted left great toe, L 3rd toe, L 5th toe, and R 4th toe. No erythema, no edema, no drainage, no fluctuance.  Musculoskeletal Examination: Muscle strength 5/5 to b/l LE. HAV with bunion deformity noted b/l LE. Clawtoe deformity 1-5 bilaterally.  Radiographs: None  Last A1c:      Latest Ref Rng & Units 04/22/2023    9:11 AM 10/22/2022    9:40 AM  Hemoglobin A1C  Hemoglobin-A1c 4.6 - 6.5 % 5.5  5.5    Assessment:   1. Abrasion of multiple toes   2. Pain due to onychomycosis of toenails of both feet   3. Diabetic peripheral neuropathy associated with type 2 diabetes mellitus (HCC)    Plan:  Patient was evaluated and treated. All patient's and/or POA's questions/concerns addressed on today's visit. Toenails 1-5 debrided in length and girth without incident. Apply Mupirocin Ointment to abrasions once daily until resolved. Continue foot and shoe inspections daily. Monitor blood glucose per PCP/Endocrinologist's recommendations. Continue soft, supportive shoe gear daily. Report any pedal injuries to medical professional. Call office  if there are any questions/concerns. -Patient/POA to call should there be question/concern in the interim.  No follow-ups on file.  Freddie Breech, DPM      [place] LOCATION: [address] [place] [address]                   Office [phone]   [facility] LOCATION: [address] [place], [place] [address] Office [phone]

## 2023-09-27 ENCOUNTER — Other Ambulatory Visit (HOSPITAL_BASED_OUTPATIENT_CLINIC_OR_DEPARTMENT_OTHER): Payer: Self-pay

## 2023-09-27 ENCOUNTER — Ambulatory Visit
Admission: RE | Admit: 2023-09-27 | Discharge: 2023-09-27 | Disposition: A | Discharge status: Home / Self Care | Payer: Self-pay | Source: Ambulatory Visit | Attending: Family Medicine | Admitting: Family Medicine

## 2023-09-27 VITALS — BP 127/69 | HR 76 | Resp 17

## 2023-09-27 DIAGNOSIS — R0981 Nasal congestion: Secondary | ICD-10-CM

## 2023-09-27 DIAGNOSIS — J0100 Acute maxillary sinusitis, unspecified: Secondary | ICD-10-CM

## 2023-09-27 MED ORDER — PREDNISONE 20 MG PO TABS
60.0000 mg | ORAL_TABLET | Freq: Every day | ORAL | 0 refills | Status: DC
  Filled 2023-09-27: qty 15, 5d supply, fill #0

## 2023-09-27 MED ORDER — AMOXICILLIN-POT CLAVULANATE 875-125 MG PO TABS
1.0000 | ORAL_TABLET | Freq: Two times a day (BID) | ORAL | 0 refills | Status: DC
  Filled 2023-09-27: qty 14, 7d supply, fill #0

## 2023-09-27 NOTE — ED Triage Notes (Signed)
 Pt c/o nasal congestion, HA and bilateral ear pain since Sat. Low grade fever. Hx of sinus infections/sinus surgeries. Taking tylenol and zyrtec prn.

## 2023-09-27 NOTE — ED Provider Notes (Signed)
 [name] CARE    CSN: [id number] Arrival date & time: 09/27/23  0801      History   Chief Complaint Chief Complaint  Patient presents with   Nasal Congestion   Headache   Otalgia    HPI [name] is a 68 y.o. female.   HPI pleasant 68 year old female presents with nasal congestion, headache, bilateral ear pain for 4 days.  Patient reports history of sinus infections with sinus surgeries.  PMH significant for IBS, HTN, and fibromyalgia  Past Medical History:  Diagnosis Date   Allergic rhinitis    Anxiety    Asthma    hx bronchial asthma at times with upper resp infection   Depression    Fibromyalgia    GERD (gastroesophageal reflux disease)    Headache(784.0)    migraine and cluster headaches   Hyperlipemia    Hypertension    IBS (irritable bowel syndrome)    Menopause    Neuromuscular disorder (HCC) 2009   hx of fibromyalgia, polyarthralsia (surgery induced)   Osteoarthritis    Rheumatoid arthritis (HCC)    Complicated by osteoarthritis as well.   Rheumatoid arthritis (HCC)    Stress incontinence    at times   Tibial fracture 10/14/2016   evulsion periostial right   Type 2 diabetes mellitus with complication, without long-term current use of insulin (HCC)    diet controlled no meds -> however was diagnosed with a diabetic foot ulcer.    Patient Active Problem List   Diagnosis Date Noted   Panniculitis 03/15/2023   Unilateral primary osteoarthritis, right knee 01/24/2023   Family history of abdominal aortic aneurysm (AAA) repair 07/23/2022   Synovitis of toe 04/19/2022   Preventative health care 10/22/2021   Colon cancer screening 08/05/2021   Diabetic ulcer of toe of right foot associated with type 2 diabetes mellitus, limited to breakdown of skin (HCC) 06/19/2021   Tinea corporis 06/19/2021   Dysuria 06/19/2021   IDA (iron deficiency anemia) 05/18/2021   Pre-operative clearance 04/21/2021   Controlled type 2 diabetes mellitus with  hyperglycemia, without long-term current use of insulin (HCC) 04/21/2021   Spinal stenosis of lumbar region 04/21/2021   Bronchitis due to COVID-19 virus 03/27/2021   Orthostatic hypotension 01/09/2021   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   Spondylosis of cervical spine 10/07/2020   Lumbar radiculopathy 10/07/2020   Anemia 09/11/2020   Intractable migraine 08/26/2020   Inflammatory arthritis 07/22/2020   Thrombocytopenia (HCC) 07/22/2020   Chronic nonintractable headache 07/21/2020   Syncope 07/21/2020   Dizziness 07/21/2020   Cellulitis of right hand 07/21/2020   Lumbar spondylolysis 05/13/2020   Primary hypertension 04/02/2020   Body mass index (BMI) 29.0-29.9, adult 04/02/2020   Menopausal symptom 03/11/2020   Stress 03/11/2020   Unspecified dyspareunia (CODE) 03/11/2020   Bilateral sacroiliitis (HCC) 03/04/2020   Senile calcific aortic valve sclerosis 02/08/2020   DOE (dyspnea on exertion) 02/08/2020   Murmur 01/07/2020   Acquired trigger finger of right ring finger 10/25/2019   Pain in right knee 08/24/2019   Rheumatoid arthritis involving multiple sites with positive rheumatoid factor (HCC) 08/02/2019   Primary osteoarthritis of both knees 08/02/2019   PTSD (post-traumatic stress disorder) 02/08/2019   Dog bite of arm, right, initial encounter 10/05/2018   Hyperlipidemia associated with type 2 diabetes mellitus (HCC) 08/24/2018   Symptomatic abdominal panniculus 12/12/2017   Carpal tunnel syndrome of left wrist 11/28/2017   Pain in right hand 11/08/2017   Cervical radiculopathy 10/06/2017  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   Tibial fracture 10/14/2016   Keratosis 12/04/2014   Onychocryptosis 08/21/2014   Onychomycosis 05/21/2014   Fissured skin 01/22/2014   Fissure in skin of foot 12/18/2013   Porokeratosis 12/18/2013   Changes in skin texture 12/18/2013   Pain in lower limb 11/21/2013   Ingrown nail 07/20/2013   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 07/02/2013   Obesity (BMI 30-39.9) 06/25/2013   GERD (gastroesophageal reflux disease) 08/20/2011   Lapband APL + HH repair June 2011-Major revision Dec 2013 06/29/2011   Abdominal pain 08/01/2010   VITAMIN B12 DEFICIENCY 04/01/2010   BARIATRIC SURGERY STATUS 01/29/2010   ONYCHOMYCOSIS, BILATERAL 06/30/2009   STRESS INCONTINENCE 06/10/2009   ACUTE PHARYNGITIS 04/29/2009   COUGH 04/27/2009   BRONCHITIS, ACUTE 04/16/2009   NAUSEA 04/08/2009   DIARRHEA 04/08/2009   MORBID OBESITY 03/03/2009   SKIN RASH 11/18/2008   UNSPECIFIED VITAMIN D DEFICIENCY 07/15/2008   OTHER SPECIFIED ANEMIAS 07/15/2008   Pain in joint, multiple sites 06/13/2008   Myalgia and myositis, unspecified 06/13/2008   Myalgia 06/13/2008   BACK PAIN, LUMBAR 02/14/2008   ACUTE SINUSITIS, UNSPECIFIED 09/01/2007   CELLULITIS 08/12/2007   Cellulitis 08/12/2007   Depression with anxiety 06/09/2007   DEPRESSION 06/09/2007   Controlled type 2 diabetes mellitus with diabetic neuropathy, without long-term current use of insulin (HCC) 01/04/2007   Labile hypertension 01/04/2007   ALLERGIC RHINITIS 01/04/2007   HEADACHE 01/04/2007   Anxiety 12/02/1998    Past Surgical History:  Procedure Laterality Date   ABDOMINAL HYSTERECTOMY  12/1999   complete   CARPAL TUNNEL RELEASE Right 12/27/2017   Procedure: CARPAL TUNNEL RELEASE;  Surgeon: Dominica Severin, MD;  Location: [facility];  Service: Orthopedics;  Laterality: Right;   CERVICAL CONIZATION W/BX  june 1990   dysplasia of cervix/used 5Fu cream for 3 months   CERVICAL LAMINECTOMY  2005 & 2009   X 2   NO ROM PROBLEMS   CESAREAN SECTION  1989 & 1993   X 2   CHOLECYSTECTOMY  1986   DILATION AND CURETTAGE OF UTERUS   [phone]   missed abortion   ESOPHAGOGASTRODUODENOSCOPY  06/29/2011   Procedure: ESOPHAGOGASTRODUODENOSCOPY (EGD);  Surgeon: Kandis Cocking, MD;  Location: [facility];  Service: General;  Laterality: N/A;   FOOT SURGERY  2005   RT HEEL    GASTRIC BANDING PORT REVISION  09/24/2011   Procedure: GASTRIC BANDING PORT REVISION;  Surgeon: Valarie Merino, MD;  Location: [facility];  Service: General;  Laterality: N/A;   GASTRIC ROUX-EN-Y N/A 06/06/2017   Procedure: 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;  Surgeon: Luretha Murphy, MD;  Location: [facility];  Service: General;  Laterality: N/A;   HYSTEROSCOPY  1999   LAPAROSCOPIC GASTRIC BANDING  12/30/09   LAPAROSCOPIC GASTRIC SLEEVE RESECTION N/A 07/02/2013   Procedure: LAPAROSCOPIC GASTRIC SLEEVE RESECTION upper endoscopy;  Surgeon: Valarie Merino, MD;  Location: [facility];  Service: General;  Laterality: N/A;   LAPAROSCOPIC REPAIR AND REMOVAL OF GASTRIC BAND N/A 07/02/2013   Procedure: LAPAROSCOPIC REMOVAL OF GASTRIC BAND;  Surgeon: Valarie Merino, MD;  Location: [facility];  Service: General;  Laterality: N/A;   LAPAROSCOPIC REVISION OF GASTRIC BAND  07/03/2012   Procedure: LAPAROSCOPIC REVISION OF GASTRIC BAND;  Surgeon: Valarie Merino, MD;  Location: [facility];  Service: General;  Laterality: N/A;  removal of old lap. band port, replaced with AP standard band   LAPAROSCOPY  09/24/2011  Procedure: LAPAROSCOPY DIAGNOSTIC;  Surgeon: Valarie Merino, MD;  Location: [facility];  Service: General;  Laterality: N/A;   LAPAROSCOPY  07/03/2012   Procedure: LAPAROSCOPY DIAGNOSTIC;  Surgeon: Valarie Merino, MD;  Location: [facility];  Service: General;  Laterality: N/A;  Exploratory Laparoscopy    MESH APPLIED TO LAP PORT  07/03/2012   Procedure: MESH APPLIED TO LAP PORT;  Surgeon: Valarie Merino, MD;  Location: [facility];  Service: General;  Laterality: N/A;   NASAL SINUS SURGERY  1995 & 1997   X 2   right knee  1981   ARTHROSCOPY AND ARTHROTOMY   right knee arthroscopy and arthrotomy  12-1979   TONSILLECTOMY  1971   TUBAL LIGATION  1993   WITH C -SECTION    OB History     Gravida  3   Para  0   Term  0   Preterm  0   AB  1   Living  2       SAB  1   IAB  0   Ectopic  0   Multiple  0   Live Births               Home Medications    Prior to Admission medications   Medication Sig Start Date End Date Taking? Authorizing Provider  amoxicillin-clavulanate (AUGMENTIN) 875-125 MG tablet Take 1 tablet by mouth every 12 (twelve) hours. 09/27/23  Yes Trevor Iha, FNP  predniSONE (DELTASONE) 20 MG tablet Take 3 tablets (60 mg total) by mouth daily for 5 days. 09/27/23  Yes Trevor Iha, FNP  acetaminophen (TYLENOL) 650 MG CR tablet Take by mouth.    [provider]  Cholecalciferol 125 MCG (5000 UT) TABS take 1 tablet by oral route every day    [provider]  conjugated estrogens (PREMARIN) vaginal cream Place 0.5 Applicatorfuls (1 gram) vaginally 3 (three) times a week. 09/05/23     Cyanocobalamin 3000 MCG/ML LIQD as directed Sublingual    [provider]  cyclobenzaprine (FLEXERIL) 10 MG tablet Take 1 tablet (10 mg total) by mouth every 12 (twelve) hours as needed. 08/23/23     diclofenac Sodium (VOLTAREN) 1 % GEL Apply 4 g topically 4 (four) times daily. 04/22/23   Donato Schultz, DO  DULoxetine (CYMBALTA) 30 MG capsule Take 1 capsule (30 mg total) by mouth 2 (two) times daily. 04/22/23   Donato Schultz, DO  Emollient (DERMEND BRUISE FORMULA) CREA as directed Externally 06/04/20   [provider]  estradiol (DOTTI) 0.1 MG/24HR patch Apply 1 patch twice a week by transdermal route. 09/13/22     estradiol (DOTTI) 0.1 MG/24HR patch Place 1 patch (0.1 mg total) onto the skin 2 (two) times a week. 09/05/23     ferric derisomaltose (MONOFERRIC) 1000 MG/10ML SOLN injection     [provider]  furosemide (LASIX) 20 MG tablet Take 1 tablet (20 mg total) by mouth every morning. 01/14/23   Seabron Spates R, DO  glucose blood test strip Test blood sugar once daily. Dx Code: E11.9 01/09/19   Zola Button, Grayling Congress, DO  hydrALAZINE (APRESOLINE) 25 MG tablet Take 1 tablet (25 mg  total) by mouth 2 (two) times daily. Hold if feeling dizzy or SBP<110 mmHg).  Can use additional 25 mg as needed for SBP>150 mmHg 07/27/23   Marykay Lex, MD  hydroxychloroquine (PLAQUENIL) 200 MG tablet Take 2 tablets (400 mg total) by mouth once daily. 08/05/23  Hyoscyamine Sulfate SL (LEVSIN/SL) 0.125 MG SUBL Place 1 tablet under the tongue every 6 (six) hours as needed. 06/28/22   Donato Schultz, DO  methocarbamol (ROBAXIN) 750 MG tablet Take 1 tablet (750 mg total) by mouth every 8 (eight) hours. 08/23/23     Multiple Vitamins-Minerals (CENTRUM ADULTS) TABS take 2 tablets by oral route  every day with food    [provider]  mupirocin ointment (BACTROBAN) 2 % Apply to toes as needed 12/16/22   McCaughan, Dia D, DPM  nystatin (MYCOSTATIN/NYSTOP) powder Apply 1 Application topically 3 (three) times daily. 01/14/23 01/14/24  Donato Schultz, DO  ofloxacin (FLOXIN) 0.3 % OTIC solution Place 10 drops into both ears daily. 01/14/23   Donato Schultz, DO  olmesartan (BENICAR) 20 MG tablet Take 1 tablet (20 mg total) by mouth daily with supper. 07/27/23   Marykay Lex, MD  ondansetron (ZOFRAN) 4 MG tablet Take 1 tablet (4 mg total) by mouth every 8 (eight) hours as needed for nausea or vomiting. 04/22/23   Donato Schultz, DO  pantoprazole (PROTONIX) 40 MG tablet Take 1 tablet (40 mg total) by mouth daily. 06/15/23   Donato Schultz, DO  potassium chloride (KLOR-CON M) 10 MEQ tablet Take 1 tablet (10 mEq total) by mouth daily. 04/22/23   Donato Schultz, DO  pregabalin (LYRICA) 150 MG capsule Take one capsule (150 mg dose) by mouth 3 (three) times a day. Max Daily Amount: 450 mg 06/01/23     pregabalin (LYRICA) 150 MG capsule Take one capsule (150 mg dose) by mouth 3 (three) times a day. Max Daily Amount: 450 mg 09/07/23     Rimegepant Sulfate (NURTEC) 75 MG TBDP Take 1 tablet (75 mg total) by mouth every other day. 09/13/23   Drema Dallas, DO  rosuvastatin  (CRESTOR) 20 MG tablet Take 1 tablet (20 mg total) by mouth daily. 09/01/23   Seabron Spates R, DO  temazepam (RESTORIL) 30 MG capsule Take 1 capsule (30 mg total) by mouth at bedtime as needed for sleep. 04/22/23   Donato Schultz, DO  thiamine (VITAMIN B-1) 100 MG tablet Take 100 mg daily by mouth.    [provider]  tirzepatide Greggory Keen) 12.5 MG/0.5ML Pen Inject 12.5 mg into the skin once a week. 09/16/23   Donato Schultz, DO  traMADol (ULTRAM) 50 MG tablet Take 1 tablet (50 mg total) by mouth every 8 (eight) hours as needed. 08/23/23     triamcinolone ointment (KENALOG) 0.1 % Apply 1 Application (a thin layer) topically in the morning and at bedtime. 09/02/22     triamcinolone ointment (KENALOG) 0.1 % APPLY A THIN LAYER TO THE AFFECTED AREA(S) BY TOPICAL ROUTE 2 TIMES PER DAY 09/05/23     Ubrogepant (UBRELVY) 100 MG TABS TAKE ONE TABLET BY MOUTH DAILY AS NEEDED FOR MIGRAINE. MAX 2 TABLETS IN 24 HOURS 09/13/23   Drema Dallas, DO    Family History Family History  Problem Relation Age of Onset   Diabetes Mother    Stroke Mother    Breast cancer Mother    Atrial fibrillation Mother    Hypertension Mother    Cancer Mother 71       breast   Diabetes Father    Stroke Father 20       2nd 6 month apart   Hypertension Father    Heart attack Father 33  stents   Dementia Father    AAA (abdominal aortic aneurysm) Father 9       repair   CAD Father 5   Thyroid disease Other    Heart disease Other    COPD Other     Social History Social History   Tobacco Use   Smoking status: Never   Smokeless tobacco: Never  Vaping Use   Vaping status: Never Used  Substance Use Topics   Alcohol use: Not Currently   Drug use: Never     Allergies   Gabapentin, Isoniazid, Morphine, Nitrofurantoin, Tamiflu [oseltamivir phosphate], Hctz [hydrochlorothiazide], Macrolides and ketolides, Promethazine, Wound dressing adhesive, Fentanyl, and  Sulfamethoxazole-trimethoprim   Review of Systems Review of Systems  HENT:  Positive for congestion, ear pain, sinus pressure and sinus pain.   Neurological:  Positive for headaches.     Physical Exam Triage Vital Signs ED Triage Vitals  Encounter Vitals Group     BP 09/27/23 0828 127/69     Systolic BP Percentile --      Diastolic BP Percentile --      Pulse Rate 09/27/23 0828 76     Resp 09/27/23 0828 17     Temp --      Temp Source 09/27/23 0828 Oral     SpO2 09/27/23 0828 98 %     Weight --      Height --      Head Circumference --      Peak Flow --      Pain Score 09/27/23 0829 3     Pain Loc --      Pain Education --      Exclude from Growth Chart --    No data found.  Updated Vital Signs BP 127/69 (BP Location: Left Arm)   Pulse 76   Resp 17   SpO2 98%     Physical Exam Vitals and nursing note reviewed.  Constitutional:      Appearance: Normal appearance. She is normal weight. She is ill-appearing.  HENT:     Head: Normocephalic and atraumatic.     Right Ear: Tympanic membrane and external ear normal.     Left Ear: Tympanic membrane and external ear normal.     Ears:     Comments: Moderate eustachian tube dysfunction noted bilaterally    Nose:     Right Sinus: Maxillary sinus tenderness present.     Left Sinus: Maxillary sinus tenderness present.     Comments: Turbinates are erythematous/edematous    Mouth/Throat:     Mouth: Mucous membranes are moist.     Pharynx: Oropharynx is clear.  Eyes:     Extraocular Movements: Extraocular movements intact.     Conjunctiva/sclera: Conjunctivae normal.     Pupils: Pupils are equal, round, and reactive to light.  Cardiovascular:     Rate and Rhythm: Normal rate and regular rhythm.     Pulses: Normal pulses.     Heart sounds: Normal heart sounds.  Pulmonary:     Effort: Pulmonary effort is normal.     Breath sounds: Normal breath sounds. No wheezing, rhonchi or rales.  Musculoskeletal:        General:  Normal range of motion.  Skin:    General: Skin is warm and dry.  Neurological:     General: No focal deficit present.     Mental Status: She is alert and oriented to person, place, and time. Mental status is at baseline.  Psychiatric:  Mood and Affect: Mood normal.        Behavior: Behavior normal.      UC Treatments / Results  Labs (all labs ordered are listed, but only abnormal results are displayed) Labs Reviewed - No data to display  EKG   Radiology No results found.  Procedures Procedures (including critical care time)  Medications Ordered in UC Medications - No data to display  Initial Impression / Assessment and Plan / UC Course  I have reviewed the triage vital signs and the nursing notes.  Pertinent labs & imaging results that were available during my care of the patient were reviewed by me and considered in my medical decision making (see chart for details).     MDM: 1.  Acute maxillary sinusitis, recurrence not specified-Rx'd Augmentin 875/125 mg tablet: Take 1 tablet twice daily x 7 days; 2.  Nasal congestion-Rx prednisone 20 mg tablet: Take 3 tabs p.o. daily x 5 days. Advised patient to take medication as directed with food to completion.  Advised patient to take prednisone with first dose of Augmentin for the next 5 of 7 days.  Encouraged to increase daily water intake to 64 ounces per day while taking these medications.  Advised if symptoms worsen and/or unresolved please follow-up with your PCP, ENT, or here for further evaluation. Final Clinical Impressions(s) / UC Diagnoses   Final diagnoses:  Nasal congestion  Acute maxillary sinusitis, recurrence not specified     Discharge Instructions      Advised patient to take medication as directed with food to completion.  Advised patient to take prednisone with first dose of Augmentin for the next 5 of 7 days.  Encouraged to increase daily water intake to 64 ounces per day while taking these  medications.  Advised if symptoms worsen and/or unresolved please follow-up with your PCP, ENT, or here for further evaluation.     ED Prescriptions     Medication Sig Dispense Auth. Provider   amoxicillin-clavulanate (AUGMENTIN) 875-125 MG tablet Take 1 tablet by mouth every 12 (twelve) hours. 14 tablet Trevor Iha, FNP   predniSONE (DELTASONE) 20 MG tablet Take 3 tablets (60 mg total) by mouth daily for 5 days. 15 tablet Trevor Iha, FNP      PDMP not reviewed this encounter.   Trevor Iha, FNP 09/27/23 [phone]

## 2023-09-27 NOTE — Discharge Instructions (Addendum)
 Advised patient to take medication as directed with food to completion.  Advised patient to take prednisone with first dose of Augmentin for the next 5 of 7 days.  Encouraged to increase daily water intake to 64 ounces per day while taking these medications.  Advised if symptoms worsen and/or unresolved please follow-up with your PCP, ENT, or here for further evaluation.

## 2023-10-02 ENCOUNTER — Ambulatory Visit
Admission: RE | Admit: 2023-10-02 | Discharge: 2023-10-02 | Disposition: A | Discharge status: Home / Self Care | Source: Ambulatory Visit | Attending: Family Medicine | Admitting: Family Medicine

## 2023-10-02 ENCOUNTER — Ambulatory Visit (INDEPENDENT_AMBULATORY_CARE_PROVIDER_SITE_OTHER)

## 2023-10-02 VITALS — BP 129/76 | HR 78 | Temp 98.0°F

## 2023-10-02 DIAGNOSIS — R059 Cough, unspecified: Secondary | ICD-10-CM

## 2023-10-02 MED ORDER — HYDROCODONE BIT-HOMATROP MBR 5-1.5 MG/5ML PO SOLN: 5.0000 mL | Freq: Four times a day (QID) | ORAL | 0 refills | Status: DC | PRN

## 2023-10-02 NOTE — ED Provider Notes (Signed)
 [name] CARE    CSN: [id number] Arrival date & time: 10/02/23  1326      History   Chief Complaint Chief Complaint  Patient presents with   Cough    Have finished Prednisone treatment. Have one more day of antibiotic. For past 2 days have had thick green secretions coughed up and low grade fever 100.3 at night. Coughing has kept me up at night and unable to get a good nights sleep. ? Bronchitis . - Entered by patient    HPI [name] is a 68 y.o. female.   HPI 68 year old female presents with cough for 7 to 10 days.  Patient was evaluated here by me on 09/27/2023 please see epic for that encounter note.  Reports has completed prednisone and antibiotic from previous treatment and is not improving reports low-grade fever at night 100.3.  PMH significant for fibromyalgia, HTN, and HLD.  Past Medical History:  Diagnosis Date   Allergic rhinitis    Anxiety    Asthma    hx bronchial asthma at times with upper resp infection   Depression    Fibromyalgia    GERD (gastroesophageal reflux disease)    Headache(784.0)    migraine and cluster headaches   Hyperlipemia    Hypertension    IBS (irritable bowel syndrome)    Menopause    Neuromuscular disorder (HCC) 2009   hx of fibromyalgia, polyarthralsia (surgery induced)   Osteoarthritis    Rheumatoid arthritis (HCC)    Complicated by osteoarthritis as well.   Rheumatoid arthritis (HCC)    Stress incontinence    at times   Tibial fracture 10/14/2016   evulsion periostial right   Type 2 diabetes mellitus with complication, without long-term current use of insulin (HCC)    diet controlled no meds -> however was diagnosed with a diabetic foot ulcer.    Patient Active Problem List   Diagnosis Date Noted   Panniculitis 03/15/2023   Unilateral primary osteoarthritis, right knee 01/24/2023   Family history of abdominal aortic aneurysm (AAA) repair 07/23/2022   Synovitis of toe 04/19/2022   Preventative health care  10/22/2021   Colon cancer screening 08/05/2021   Diabetic ulcer of toe of right foot associated with type 2 diabetes mellitus, limited to breakdown of skin (HCC) 06/19/2021   Tinea corporis 06/19/2021   Dysuria 06/19/2021   IDA (iron deficiency anemia) 05/18/2021   Pre-operative clearance 04/21/2021   Controlled type 2 diabetes mellitus with hyperglycemia, without long-term current use of insulin (HCC) 04/21/2021   Spinal stenosis of lumbar region 04/21/2021   Bronchitis due to COVID-19 virus 03/27/2021   Orthostatic hypotension 01/09/2021   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   Spondylosis of cervical spine 10/07/2020   Lumbar radiculopathy 10/07/2020   Anemia 09/11/2020   Intractable migraine 08/26/2020   Inflammatory arthritis 07/22/2020   Thrombocytopenia (HCC) 07/22/2020   Chronic nonintractable headache 07/21/2020   Syncope 07/21/2020   Dizziness 07/21/2020   Cellulitis of right hand 07/21/2020   Lumbar spondylolysis 05/13/2020   Primary hypertension 04/02/2020   Body mass index (BMI) 29.0-29.9, adult 04/02/2020   Menopausal symptom 03/11/2020   Stress 03/11/2020   Unspecified dyspareunia (CODE) 03/11/2020   Bilateral sacroiliitis (HCC) 03/04/2020   Senile calcific aortic valve sclerosis 02/08/2020   DOE (dyspnea on exertion) 02/08/2020   Murmur 01/07/2020   Acquired trigger finger of right ring finger 10/25/2019   Pain in right knee 08/24/2019   Rheumatoid arthritis involving multiple sites with positive rheumatoid  factor (HCC) 08/02/2019   Primary osteoarthritis of both knees 08/02/2019   PTSD (post-traumatic stress disorder) 02/08/2019   Dog bite of arm, right, initial encounter 10/05/2018   Hyperlipidemia associated with type 2 diabetes mellitus (HCC) 08/24/2018   Symptomatic abdominal panniculus 12/12/2017   Carpal tunnel syndrome of left wrist 11/28/2017   Pain in right hand 11/08/2017   Cervical radiculopathy 10/06/2017   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   Tibial fracture 10/14/2016   Keratosis 12/04/2014   Onychocryptosis 08/21/2014   Onychomycosis 05/21/2014   Fissured skin 01/22/2014   Fissure in skin of foot 12/18/2013   Porokeratosis 12/18/2013   Changes in skin texture 12/18/2013   Pain in lower limb 11/21/2013   Ingrown nail 07/20/2013   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 07/02/2013   Obesity (BMI 30-39.9) 06/25/2013   GERD (gastroesophageal reflux disease) 08/20/2011   Lapband APL + HH repair June 2011-Major revision Dec 2013 06/29/2011   Abdominal pain 08/01/2010   VITAMIN B12 DEFICIENCY 04/01/2010   BARIATRIC SURGERY STATUS 01/29/2010   ONYCHOMYCOSIS, BILATERAL 06/30/2009   STRESS INCONTINENCE 06/10/2009   ACUTE PHARYNGITIS 04/29/2009   COUGH 04/27/2009   BRONCHITIS, ACUTE 04/16/2009   NAUSEA 04/08/2009   DIARRHEA 04/08/2009   MORBID OBESITY 03/03/2009   SKIN RASH 11/18/2008   UNSPECIFIED VITAMIN D DEFICIENCY 07/15/2008   OTHER SPECIFIED ANEMIAS 07/15/2008   Pain in joint, multiple sites 06/13/2008   Myalgia and myositis, unspecified 06/13/2008   Myalgia 06/13/2008   BACK PAIN, LUMBAR 02/14/2008   ACUTE SINUSITIS, UNSPECIFIED 09/01/2007   CELLULITIS 08/12/2007   Cellulitis 08/12/2007   Depression with anxiety 06/09/2007   DEPRESSION 06/09/2007   Controlled type 2 diabetes mellitus with diabetic neuropathy, without long-term current use of insulin (HCC) 01/04/2007   Labile hypertension 01/04/2007   ALLERGIC RHINITIS 01/04/2007   HEADACHE 01/04/2007   Anxiety 12/02/1998    Past Surgical History:  Procedure Laterality Date   ABDOMINAL HYSTERECTOMY  12/1999   complete   CARPAL TUNNEL RELEASE Right 12/27/2017   Procedure: CARPAL TUNNEL RELEASE;  Surgeon: Dominica Severin, MD;  Location: [facility];  Service: Orthopedics;  Laterality: Right;   CERVICAL CONIZATION W/BX  june 1990   dysplasia of cervix/used 5Fu cream for 3 months   CERVICAL LAMINECTOMY  2005 & 2009   X 2   NO ROM  PROBLEMS   CESAREAN SECTION  1989 & 1993   X 2   CHOLECYSTECTOMY  1986   DILATION AND CURETTAGE OF UTERUS   [phone]   missed abortion   ESOPHAGOGASTRODUODENOSCOPY  06/29/2011   Procedure: ESOPHAGOGASTRODUODENOSCOPY (EGD);  Surgeon: Kandis Cocking, MD;  Location: [facility];  Service: General;  Laterality: N/A;   FOOT SURGERY  2005   RT HEEL   GASTRIC BANDING PORT REVISION  09/24/2011   Procedure: GASTRIC BANDING PORT REVISION;  Surgeon: Valarie Merino, MD;  Location: [facility];  Service: General;  Laterality: N/A;   GASTRIC ROUX-EN-Y N/A 06/06/2017   Procedure: 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;  Surgeon: Luretha Murphy, MD;  Location: [facility];  Service: General;  Laterality: N/A;   HYSTEROSCOPY  1999   LAPAROSCOPIC GASTRIC BANDING  12/30/09   LAPAROSCOPIC GASTRIC SLEEVE RESECTION N/A 07/02/2013   Procedure: LAPAROSCOPIC GASTRIC SLEEVE RESECTION upper endoscopy;  Surgeon: Valarie Merino, MD;  Location: [facility];  Service: General;  Laterality: N/A;   LAPAROSCOPIC REPAIR AND REMOVAL OF GASTRIC BAND N/A 07/02/2013   Procedure: LAPAROSCOPIC REMOVAL OF GASTRIC  BAND;  Surgeon: Valarie Merino, MD;  Location: [facility];  Service: General;  Laterality: N/A;   LAPAROSCOPIC REVISION OF GASTRIC BAND  07/03/2012   Procedure: LAPAROSCOPIC REVISION OF GASTRIC BAND;  Surgeon: Valarie Merino, MD;  Location: [facility];  Service: General;  Laterality: N/A;  removal of old lap. band port, replaced with AP standard band   LAPAROSCOPY  09/24/2011   Procedure: LAPAROSCOPY DIAGNOSTIC;  Surgeon: Valarie Merino, MD;  Location: [facility];  Service: General;  Laterality: N/A;   LAPAROSCOPY  07/03/2012   Procedure: LAPAROSCOPY DIAGNOSTIC;  Surgeon: Valarie Merino, MD;  Location: [facility];  Service: General;  Laterality: N/A;  Exploratory Laparoscopy    MESH APPLIED TO LAP PORT  07/03/2012   Procedure: MESH APPLIED TO LAP PORT;  Surgeon: Valarie Merino, MD;  Location:  [facility];  Service: General;  Laterality: N/A;   NASAL SINUS SURGERY  1995 & 1997   X 2   right knee  1981   ARTHROSCOPY AND ARTHROTOMY   right knee arthroscopy and arthrotomy  12-1979   TONSILLECTOMY  1971   TUBAL LIGATION  1993   WITH C -SECTION    OB History     Gravida  3   Para  0   Term  0   Preterm  0   AB  1   Living  2      SAB  1   IAB  0   Ectopic  0   Multiple  0   Live Births               Home Medications    Prior to Admission medications   Medication Sig Start Date End Date Taking? Authorizing Provider  HYDROcodone bit-homatropine (HYCODAN) 5-1.5 MG/5ML syrup Take 5 mLs by mouth every 6 (six) hours as needed for cough. 10/02/23  Yes Trevor Iha, FNP  acetaminophen (TYLENOL) 650 MG CR tablet Take by mouth.    [provider]  amoxicillin-clavulanate (AUGMENTIN) 875-125 MG tablet Take 1 tablet by mouth every 12 (twelve) hours. 09/27/23   Trevor Iha, FNP  Cholecalciferol 125 MCG (5000 UT) TABS take 1 tablet by oral route every day    [provider]  conjugated estrogens (PREMARIN) vaginal cream Place 0.5 Applicatorfuls (1 gram) vaginally 3 (three) times a week. 09/05/23     Cyanocobalamin 3000 MCG/ML LIQD as directed Sublingual    [provider]  cyclobenzaprine (FLEXERIL) 10 MG tablet Take 1 tablet (10 mg total) by mouth every 12 (twelve) hours as needed. 08/23/23     diclofenac Sodium (VOLTAREN) 1 % GEL Apply 4 g topically 4 (four) times daily. 04/22/23   Donato Schultz, DO  DULoxetine (CYMBALTA) 30 MG capsule Take 1 capsule (30 mg total) by mouth 2 (two) times daily. 04/22/23   Donato Schultz, DO  Emollient (DERMEND BRUISE FORMULA) CREA as directed Externally 06/04/20   [provider]  estradiol (DOTTI) 0.1 MG/24HR patch Apply 1 patch twice a week by transdermal route. 09/13/22     estradiol (DOTTI) 0.1 MG/24HR patch Place 1 patch (0.1 mg total) onto the skin 2 (two) times a week. 09/05/23      ferric derisomaltose (MONOFERRIC) 1000 MG/10ML SOLN injection     [provider]  furosemide (LASIX) 20 MG tablet Take 1 tablet (20 mg total) by mouth every morning. 01/14/23   Seabron Spates R, DO  glucose blood test strip Test blood sugar once daily. Dx  Code: E11.9 01/09/19   Donato Schultz, DO  hydrALAZINE (APRESOLINE) 25 MG tablet Take 1 tablet (25 mg total) by mouth 2 (two) times daily. Hold if feeling dizzy or SBP<110 mmHg).  Can use additional 25 mg as needed for SBP>150 mmHg 07/27/23   Marykay Lex, MD  hydroxychloroquine (PLAQUENIL) 200 MG tablet Take 2 tablets (400 mg total) by mouth once daily. 08/05/23     Hyoscyamine Sulfate SL (LEVSIN/SL) 0.125 MG SUBL Place 1 tablet under the tongue every 6 (six) hours as needed. 06/28/22   Donato Schultz, DO  methocarbamol (ROBAXIN) 750 MG tablet Take 1 tablet (750 mg total) by mouth every 8 (eight) hours. 08/23/23     Multiple Vitamins-Minerals (CENTRUM ADULTS) TABS take 2 tablets by oral route  every day with food    [provider]  mupirocin ointment (BACTROBAN) 2 % Apply to toes as needed 12/16/22   McCaughan, Dia D, DPM  nystatin (MYCOSTATIN/NYSTOP) powder Apply 1 Application topically 3 (three) times daily. 01/14/23 01/14/24  Donato Schultz, DO  ofloxacin (FLOXIN) 0.3 % OTIC solution Place 10 drops into both ears daily. 01/14/23   Donato Schultz, DO  olmesartan (BENICAR) 20 MG tablet Take 1 tablet (20 mg total) by mouth daily with supper. 07/27/23   Marykay Lex, MD  ondansetron (ZOFRAN) 4 MG tablet Take 1 tablet (4 mg total) by mouth every 8 (eight) hours as needed for nausea or vomiting. 04/22/23   Donato Schultz, DO  pantoprazole (PROTONIX) 40 MG tablet Take 1 tablet (40 mg total) by mouth daily. 06/15/23   Donato Schultz, DO  potassium chloride (KLOR-CON M) 10 MEQ tablet Take 1 tablet (10 mEq total) by mouth daily. 04/22/23   Donato Schultz, DO  predniSONE (DELTASONE) 20 MG  tablet Take 3 tablets (60 mg total) by mouth daily for 5 days. 09/27/23   Trevor Iha, FNP  pregabalin (LYRICA) 150 MG capsule Take one capsule (150 mg dose) by mouth 3 (three) times a day. Max Daily Amount: 450 mg 06/01/23     pregabalin (LYRICA) 150 MG capsule Take one capsule (150 mg dose) by mouth 3 (three) times a day. Max Daily Amount: 450 mg 09/07/23     Rimegepant Sulfate (NURTEC) 75 MG TBDP Take 1 tablet (75 mg total) by mouth every other day. 09/13/23   Drema Dallas, DO  rosuvastatin (CRESTOR) 20 MG tablet Take 1 tablet (20 mg total) by mouth daily. 09/01/23   Seabron Spates R, DO  temazepam (RESTORIL) 30 MG capsule Take 1 capsule (30 mg total) by mouth at bedtime as needed for sleep. 04/22/23   Donato Schultz, DO  thiamine (VITAMIN B-1) 100 MG tablet Take 100 mg daily by mouth.    [provider]  tirzepatide Greggory Keen) 12.5 MG/0.5ML Pen Inject 12.5 mg into the skin once a week. 09/16/23   Donato Schultz, DO  traMADol (ULTRAM) 50 MG tablet Take 1 tablet (50 mg total) by mouth every 8 (eight) hours as needed. 08/23/23     triamcinolone ointment (KENALOG) 0.1 % Apply 1 Application (a thin layer) topically in the morning and at bedtime. 09/02/22     triamcinolone ointment (KENALOG) 0.1 % APPLY A THIN LAYER TO THE AFFECTED AREA(S) BY TOPICAL ROUTE 2 TIMES PER DAY 09/05/23     Ubrogepant (UBRELVY) 100 MG TABS TAKE ONE TABLET BY MOUTH DAILY AS NEEDED FOR MIGRAINE. MAX 2 TABLETS  IN 24 HOURS 09/13/23   Drema Dallas, DO    Family History Family History  Problem Relation Age of Onset   Diabetes Mother    Stroke Mother    Breast cancer Mother    Atrial fibrillation Mother    Hypertension Mother    Cancer Mother 67       breast   Diabetes Father    Stroke Father 61       2nd 6 month apart   Hypertension Father    Heart attack Father 26       stents   Dementia Father    AAA (abdominal aortic aneurysm) Father 50       repair   CAD Father 52   Thyroid disease Other     Heart disease Other    COPD Other     Social History Social History   Tobacco Use   Smoking status: Never   Smokeless tobacco: Never  Vaping Use   Vaping status: Never Used  Substance Use Topics   Alcohol use: Not Currently   Drug use: Never     Allergies   Gabapentin, Isoniazid, Morphine, Nitrofurantoin, Tamiflu [oseltamivir phosphate], Hctz [hydrochlorothiazide], Macrolides and ketolides, Promethazine, Wound dressing adhesive, Fentanyl, and Sulfamethoxazole-trimethoprim   Review of Systems Review of Systems  Respiratory:  Positive for cough.   All other systems reviewed and are negative.    Physical Exam Triage Vital Signs ED Triage Vitals  Encounter Vitals Group     BP      Systolic BP Percentile      Diastolic BP Percentile      Pulse      Resp      Temp      Temp src      SpO2      Weight      Height      Head Circumference      Peak Flow      Pain Score      Pain Loc      Pain Education      Exclude from Growth Chart    No data found.  Updated Vital Signs BP 129/76 (BP Location: Right Arm)   Pulse 78   Temp 98 F (36.7 C) (Oral)   SpO2 99%    Physical Exam Vitals and nursing note reviewed.  Constitutional:      Appearance: Normal appearance. She is normal weight.  HENT:     Head: Normocephalic and atraumatic.     Right Ear: Tympanic membrane, ear canal and external ear normal.     Left Ear: Tympanic membrane, ear canal and external ear normal.     Mouth/Throat:     Mouth: Mucous membranes are moist.     Pharynx: Oropharynx is clear.  Eyes:     Extraocular Movements: Extraocular movements intact.     Conjunctiva/sclera: Conjunctivae normal.     Pupils: Pupils are equal, round, and reactive to light.  Cardiovascular:     Rate and Rhythm: Normal rate and regular rhythm.     Pulses: Normal pulses.     Heart sounds: Normal heart sounds.  Pulmonary:     Effort: Pulmonary effort is normal.     Breath sounds: Normal breath sounds. No  wheezing, rhonchi or rales.  Musculoskeletal:        General: Normal range of motion.     Cervical back: Normal range of motion and neck supple.  Skin:    General: Skin is warm and  dry.  Neurological:     General: No focal deficit present.     Mental Status: She is alert and oriented to person, place, and time. Mental status is at baseline.  Psychiatric:        Mood and Affect: Mood normal.        Behavior: Behavior normal.      UC Treatments / Results  Labs (all labs ordered are listed, but only abnormal results are displayed) Labs Reviewed - No data to display  EKG   Radiology DG Chest 2 View Result Date: 10/02/2023 CLINICAL DATA:  Cough for 1 week EXAM: CHEST - 2 VIEW COMPARISON:  04/09/2021 FINDINGS: Right upper quadrant surgical clips. Cervical spine fixation. Midline trachea. Normal heart size and mediastinal contours. No pleural effusion or pneumothorax. Clear lungs. IMPRESSION: No acute cardiopulmonary disease. Electronically Signed   By: Jeronimo Greaves M.D.   On: 10/02/2023 14:40    Procedures Procedures (including critical care time)  Medications Ordered in UC Medications - No data to display  Initial Impression / Assessment and Plan / UC Course  I have reviewed the triage vital signs and the nursing notes.  Pertinent labs & imaging results that were available during my care of the patient were reviewed by me and considered in my medical decision making (see chart for details).     MDM: 1.  Cough, unspecified type-CXR results revealed above, Rx'd Hycodan cough syrup 5-1.5 Mg/5 mL syrup: Take 5 mL every 6 hours, as needed for cough. Advised patient of chest x-ray results with hardcopy provided.  Advised patient may take Hycodan cough syrup at night prior to sleep due to sedative effects.  Encouraged increase daily water intake to 64 ounces per day while taking this medication.  Advised if symptoms worsen and/or unresolved please follow-up with your PCP for further  evaluation.  Patient discharged home, hemodynamically stable. Final Clinical Impressions(s) / UC Diagnoses   Final diagnoses:  Cough, unspecified type     Discharge Instructions      Advised patient of chest x-ray results with hardcopy provided.  Advised patient may take Hycodan cough syrup at night prior to sleep due to sedative effects.  Encouraged increase daily water intake to 64 ounces per day while taking this medication.  Advised if symptoms worsen and/or unresolved please follow-up with your PCP for further evaluation.     ED Prescriptions     Medication Sig Dispense Auth. Provider   HYDROcodone bit-homatropine (HYCODAN) 5-1.5 MG/5ML syrup Take 5 mLs by mouth every 6 (six) hours as needed for cough. 120 mL Trevor Iha, FNP      I have reviewed the PDMP during this encounter.   Trevor Iha, FNP 10/02/23 1625

## 2023-10-02 NOTE — ED Triage Notes (Signed)
 Pt seen 3/18 for cough and was treated with abx and prednisone. States her cough is not improving and she is unable to sleep. She had one more day of abx left. States she does not feel like she is improving.

## 2023-10-02 NOTE — Discharge Instructions (Addendum)
 Advised patient of chest x-ray results with hardcopy provided.  Advised patient may take Hycodan cough syrup at night prior to sleep due to sedative effects.  Encouraged increase daily water intake to 64 ounces per day while taking this medication.  Advised if symptoms worsen and/or unresolved please follow-up with your PCP for further evaluation.

## 2023-10-04 ENCOUNTER — Ambulatory Visit: Admitting: Family Medicine

## 2023-10-04 ENCOUNTER — Encounter: Payer: Self-pay | Admitting: Family Medicine

## 2023-10-04 ENCOUNTER — Other Ambulatory Visit (HOSPITAL_BASED_OUTPATIENT_CLINIC_OR_DEPARTMENT_OTHER): Payer: Self-pay

## 2023-10-04 VITALS — BP 118/54 | HR 80 | Temp 98.8°F | Ht 69.0 in | Wt 194.6 lb

## 2023-10-04 DIAGNOSIS — J4521 Mild intermittent asthma with (acute) exacerbation: Secondary | ICD-10-CM | POA: Diagnosis not present

## 2023-10-04 DIAGNOSIS — U071 COVID-19: Secondary | ICD-10-CM

## 2023-10-04 LAB — POCT INFLUENZA A/B
Influenza A, POC: NEGATIVE
Influenza B, POC: NEGATIVE

## 2023-10-04 LAB — POC COVID19 BINAXNOW: SARS Coronavirus 2 Ag: POSITIVE — AB

## 2023-10-04 MED ORDER — HYDROCODONE BIT-HOMATROP MBR 5-1.5 MG/5ML PO SOLN
5.0000 mL | Freq: Four times a day (QID) | ORAL | 0 refills | Status: DC | PRN
  Filled 2023-10-04 – 2023-10-10 (×2): qty 120, 6d supply, fill #0

## 2023-10-04 MED ORDER — METHYLPREDNISOLONE ACETATE 80 MG/ML IJ SUSP
80.0000 mg | Freq: Once | INTRAMUSCULAR | Status: AC
  Administered 2023-10-04: 80 mg via INTRAMUSCULAR

## 2023-10-04 MED ORDER — ALBUTEROL SULFATE HFA 108 (90 BASE) MCG/ACT IN AERS
2.0000 | INHALATION_SPRAY | Freq: Four times a day (QID) | RESPIRATORY_TRACT | 0 refills | Status: DC | PRN
  Filled 2023-10-04: qty 6.7, 10d supply, fill #0

## 2023-10-04 NOTE — Progress Notes (Signed)
 Chief Complaint  Patient presents with   Acute Visit    Patient presents today for a fever, cough, congestion, fatigue for a week now.    Delorse Limber here for URI complaints.  Duration: 1 week  Associated symptoms: Fever (102.9 F), ear pain, wheezing, shortness of breath, myalgia, and coughing Denies: sinus congestion, sinus pain, itchy watery eyes, ear drainage, sore throat, and N/V Treatment to date: prednisone, Augmentin, Hycodan, tylenol, Mucinex, INCS Sick contacts: No  Past Medical History:  Diagnosis Date   Allergic rhinitis    Anxiety    Asthma    hx bronchial asthma at times with upper resp infection   Depression    Fibromyalgia    GERD (gastroesophageal reflux disease)    Headache(784.0)    migraine and cluster headaches   Hyperlipemia    Hypertension    IBS (irritable bowel syndrome)    Menopause    Neuromuscular disorder (HCC) 2009   hx of fibromyalgia, polyarthralsia (surgery induced)   Osteoarthritis    Rheumatoid arthritis (HCC)    Complicated by osteoarthritis as well.   Rheumatoid arthritis (HCC)    Stress incontinence    at times   Tibial fracture 10/14/2016   evulsion periostial right   Type 2 diabetes mellitus with complication, without long-term current use of insulin (HCC)    diet controlled no meds -> however was diagnosed with a diabetic foot ulcer.    Objective BP (!) 118/54   Pulse 80   Temp 98.8 F (37.1 C)   Ht 5\' 9"  (1.753 m)   Wt 194 lb 9.6 oz (88.3 kg)   SpO2 98%   BMI 28.74 kg/m  General: Awake, alert, appears stated age HEENT: AT, [place], ears patent b/l and TM's neg, nares patent w/o discharge, pharynx pink and without exudates, MMM, no sinus ttp Neck: No masses or asymmetry Heart: RRR Lungs: diffuse wheezing, no accessory muscle use Psych: Age appropriate judgment and insight, normal mood and affect  COVID-19 - Plan: POC COVID-19, POCT Influenza A/B, methylPREDNISolone acetate (DEPO-MEDROL) injection 80 mg  Mild  intermittent asthma with exacerbation - Plan: albuterol (VENTOLIN HFA) 108 (90 Base) MCG/ACT inhaler, methylPREDNISolone acetate (DEPO-MEDROL) injection 80 mg  +COVID. Neg flu. Exacerbation of chronic issue 2/2 covid.  Depo-Medrol injection 80 mg IM today.  Refill her albuterol inhaler which she has not had.  She will take it every 4-6 hours scheduled while awake and every 2 hours as needed.  Continue to push fluids, practice good hand hygiene, cover mouth when coughing. CDC quarantining guidelines recommended.  F/u prn. If starting to experience fevers, shaking, or worsening shortness of breath, seek immediate care. Pt voiced understanding and agreement to the plan.  Jilda Roche [place], DO 10/04/23 2:24 PM

## 2023-10-04 NOTE — Patient Instructions (Signed)
 Continue to push fluids, practice good hand hygiene, and cover your mouth if you cough.  If you start having fevers, shaking or shortness of breath, seek immediate care.  OK to take Tylenol 1000 mg (2 extra strength tabs) or 975 mg (3 regular strength tabs) every 6 hours as needed.  The recommendations suggest returning to normal activities when, for at least 24 hours, symptoms are improving overall, and if a fever was present, it has been gone without use of a fever-reducing medication. Wear a mask and try to social distance from people for the next 5 days.   Let us know if you need anything.

## 2023-10-05 ENCOUNTER — Encounter: Payer: Self-pay | Admitting: Medical Oncology

## 2023-10-06 ENCOUNTER — Other Ambulatory Visit (HOSPITAL_COMMUNITY): Payer: Self-pay

## 2023-10-06 ENCOUNTER — Telehealth: Payer: Self-pay

## 2023-10-06 ENCOUNTER — Other Ambulatory Visit: Payer: Self-pay | Admitting: Neurology

## 2023-10-06 DIAGNOSIS — G43009 Migraine without aura, not intractable, without status migrainosus: Secondary | ICD-10-CM

## 2023-10-06 DIAGNOSIS — G43019 Migraine without aura, intractable, without status migrainosus: Secondary | ICD-10-CM

## 2023-10-06 NOTE — Telephone Encounter (Signed)
 Spoke to patient pharmacy, tried running medication and PA message returned stating it needs a PA.

## 2023-10-06 NOTE — Telephone Encounter (Signed)
 Ran test claim for Ubrelvy with success. #16 tablets for 30 days with a $0.00 co-pay. It does say that the patient filled Nurtec on 3.12.25. If this was picked up, it is possible the insurance is rejecting at her pharmacy for this reason. Or they could be running for a quantity/day supply that the insurance does not cover.

## 2023-10-06 NOTE — Telephone Encounter (Signed)
 Member has an active PA on file which is expiring on 12/13/2023 and has 991 no. of fills remaining.

## 2023-10-06 NOTE — Telephone Encounter (Signed)
*  [facility]  Pharmacy Patient Advocate Encounter  Received notification from [facility] that Prior Authorization for Bernita Raisin has been CANCELLED due to Member has an active PA on file which is expiring on 12/13/2023 and has 991 no. of fills remaining.   PA #/Case ID/Reference #: [id number]

## 2023-10-06 NOTE — Telephone Encounter (Signed)
 Pharmacy Patient Advocate Encounter   Received notification from Pt Calls Messages that prior authorization for Ubrelvy 100MG  tabets is required/requested.   Insurance verification completed.   The patient is insured through [facility] .   Per test claim: The current 30** day co-pay is, $0.00.  No PA needed at this time. This test claim was processed through [facility]- copay amounts may vary at other pharmacies due to pharmacy/plan contracts, or as the patient moves through the different stages of their insurance plan.     **Insurance max 16 tablets per 30 days  Ran test claim for Pax with success. #16 tablets for 30 days with a $0.00 co-pay. It does say that the patient filled Nurtec on 3.12.25. If this was picked up, it is possible the insurance is rejecting at her pharmacy for this reason. Or they could be running for a quantity/day supply that the insurance does not cover.

## 2023-10-07 ENCOUNTER — Other Ambulatory Visit: Payer: Self-pay | Admitting: Neurology

## 2023-10-07 DIAGNOSIS — G43019 Migraine without aura, intractable, without status migrainosus: Secondary | ICD-10-CM

## 2023-10-07 DIAGNOSIS — G43009 Migraine without aura, not intractable, without status migrainosus: Secondary | ICD-10-CM

## 2023-10-07 NOTE — Telephone Encounter (Signed)
 Per rep everything went through. Will get it ready.

## 2023-10-10 ENCOUNTER — Other Ambulatory Visit (HOSPITAL_BASED_OUTPATIENT_CLINIC_OR_DEPARTMENT_OTHER): Payer: Self-pay

## 2023-10-19 DIAGNOSIS — M0609 Rheumatoid arthritis without rheumatoid factor, multiple sites: Secondary | ICD-10-CM | POA: Diagnosis not present

## 2023-10-19 LAB — LAB REPORT - SCANNED: EGFR: 79

## 2023-10-21 ENCOUNTER — Ambulatory Visit: Payer: Commercial Managed Care - PPO | Admitting: Family Medicine

## 2023-10-21 ENCOUNTER — Other Ambulatory Visit (HOSPITAL_BASED_OUTPATIENT_CLINIC_OR_DEPARTMENT_OTHER): Payer: Self-pay

## 2023-10-21 ENCOUNTER — Encounter: Payer: Self-pay | Admitting: Family Medicine

## 2023-10-21 VITALS — BP 118/62 | HR 77 | Wt 187.0 lb

## 2023-10-21 DIAGNOSIS — E1140 Type 2 diabetes mellitus with diabetic neuropathy, unspecified: Secondary | ICD-10-CM

## 2023-10-21 DIAGNOSIS — M0579 Rheumatoid arthritis with rheumatoid factor of multiple sites without organ or systems involvement: Secondary | ICD-10-CM | POA: Diagnosis not present

## 2023-10-21 DIAGNOSIS — Z0000 Encounter for general adult medical examination without abnormal findings: Secondary | ICD-10-CM | POA: Diagnosis not present

## 2023-10-21 DIAGNOSIS — R110 Nausea: Secondary | ICD-10-CM

## 2023-10-21 DIAGNOSIS — F418 Other specified anxiety disorders: Secondary | ICD-10-CM

## 2023-10-21 DIAGNOSIS — J4521 Mild intermittent asthma with (acute) exacerbation: Secondary | ICD-10-CM

## 2023-10-21 DIAGNOSIS — E1169 Type 2 diabetes mellitus with other specified complication: Secondary | ICD-10-CM

## 2023-10-21 DIAGNOSIS — E559 Vitamin D deficiency, unspecified: Secondary | ICD-10-CM

## 2023-10-21 DIAGNOSIS — E118 Type 2 diabetes mellitus with unspecified complications: Secondary | ICD-10-CM

## 2023-10-21 DIAGNOSIS — E538 Deficiency of other specified B group vitamins: Secondary | ICD-10-CM | POA: Diagnosis not present

## 2023-10-21 DIAGNOSIS — E785 Hyperlipidemia, unspecified: Secondary | ICD-10-CM | POA: Diagnosis not present

## 2023-10-21 DIAGNOSIS — I10 Essential (primary) hypertension: Secondary | ICD-10-CM

## 2023-10-21 DIAGNOSIS — G4709 Other insomnia: Secondary | ICD-10-CM

## 2023-10-21 LAB — MICROALBUMIN / CREATININE URINE RATIO
Creatinine,U: 122 mg/dL
Microalb Creat Ratio: 7.1 mg/g (ref 0.0–30.0)
Microalb, Ur: 0.9 mg/dL (ref 0.0–1.9)

## 2023-10-21 LAB — LIPID PANEL
Cholesterol: 105 mg/dL (ref 0–200)
HDL: 60.7 mg/dL (ref >39.00)
LDL Cholesterol: 33 mg/dL (ref 0–99)
NonHDL: 44.33
Total CHOL/HDL Ratio: 2
Triglycerides: 59 mg/dL (ref 0.0–149.0)
VLDL: 11.8 mg/dL (ref 0.0–40.0)

## 2023-10-21 LAB — HEMOGLOBIN A1C: Hgb A1c MFr Bld: 5.5 % (ref 4.6–6.5)

## 2023-10-21 LAB — TSH: TSH: 1.66 u[IU]/mL (ref 0.35–5.50)

## 2023-10-21 MED ORDER — ROSUVASTATIN CALCIUM 20 MG PO TABS
20.0000 mg | ORAL_TABLET | Freq: Every day | ORAL | 1 refills | Status: DC
  Filled 2023-10-21: qty 90, 90d supply, fill #0
  Filled 2024-01-25: qty 90, 90d supply, fill #1

## 2023-10-21 MED ORDER — ONDANSETRON HCL 4 MG PO TABS
4.0000 mg | ORAL_TABLET | Freq: Three times a day (TID) | ORAL | 0 refills | Status: DC | PRN
  Filled 2023-10-21: qty 20, 7d supply, fill #0

## 2023-10-21 MED ORDER — TIRZEPATIDE 15 MG/0.5ML ~~LOC~~ SOAJ
15.0000 mg | SUBCUTANEOUS | 3 refills | Status: DC
  Filled 2023-10-21: qty 2, 28d supply, fill #0
  Filled 2023-11-13: qty 2, 28d supply, fill #1
  Filled 2023-12-11: qty 2, 28d supply, fill #2
  Filled 2024-01-06: qty 2, 28d supply, fill #3
  Filled 2024-02-03: qty 2, 28d supply, fill #4
  Filled 2024-02-29: qty 2, 28d supply, fill #5
  Filled 2024-03-25: qty 2, 28d supply, fill #6

## 2023-10-21 MED ORDER — TEMAZEPAM 30 MG PO CAPS
30.0000 mg | ORAL_CAPSULE | Freq: Every evening | ORAL | 1 refills | Status: DC | PRN
  Filled 2023-10-21: qty 90, 90d supply, fill #0
  Filled 2024-01-20: qty 90, 90d supply, fill #1

## 2023-10-21 MED ORDER — DULOXETINE HCL 30 MG PO CPEP
30.0000 mg | ORAL_CAPSULE | Freq: Two times a day (BID) | ORAL | 1 refills | Status: DC
  Filled 2023-10-21 – 2024-01-06 (×2): qty 180, 90d supply, fill #0
  Filled 2024-04-05: qty 180, 90d supply, fill #1

## 2023-10-21 NOTE — Assessment & Plan Note (Signed)
 [id number] to be checked, minimize simple carbs. Increase exercise as tolerated. Continue current meds

## 2023-10-21 NOTE — Patient Instructions (Signed)
 Preventive Care 43 Years and Older, Female Preventive care refers to lifestyle choices and visits with your health care provider that can promote health and wellness. Preventive care visits are also called wellness exams. What can I expect for my preventive care visit? Counseling Your health care provider may ask you questions about your: Medical history, including: Past medical problems. Family medical history. Pregnancy and menstrual history. History of falls. Current health, including: Memory and ability to understand (cognition). Emotional well-being. Home life and relationship well-being. Sexual activity and sexual health. Lifestyle, including: Alcohol, nicotine or tobacco, and drug use. Access to firearms. Diet, exercise, and sleep habits. Work and work Astronomer. Sunscreen use. Safety issues such as seatbelt and bike helmet use. Physical exam Your health care provider will check your: Height and weight. These may be used to calculate your BMI (body mass index). BMI is a measurement that tells if you are at a healthy weight. Waist circumference. This measures the distance around your waistline. This measurement also tells if you are at a healthy weight and may help predict your risk of certain diseases, such as type 2 diabetes and high blood pressure. Heart rate and blood pressure. Body temperature. Skin for abnormal spots. What immunizations do I need?  Vaccines are usually given at various ages, according to a schedule. Your health care provider will recommend vaccines for you based on your age, medical history, and lifestyle or other factors, such as travel or where you work. What tests do I need? Screening Your health care provider may recommend screening tests for certain conditions. This may include: Lipid and cholesterol levels. Hepatitis C test. Hepatitis B test. HIV (human immunodeficiency virus) test. STI (sexually transmitted infection) testing, if you are at  risk. Lung cancer screening. Colorectal cancer screening. Diabetes screening. This is done by checking your blood sugar (glucose) after you have not eaten for a while (fasting). Mammogram. Talk with your health care provider about how often you should have regular mammograms. BRCA-related cancer screening. This may be done if you have a family history of breast, ovarian, tubal, or peritoneal cancers. Bone density scan. This is done to screen for osteoporosis. Talk with your health care provider about your test results, treatment options, and if necessary, the need for more tests. Follow these instructions at home: Eating and drinking  Eat a diet that includes fresh fruits and vegetables, whole grains, lean protein, and low-fat dairy products. Limit your intake of foods with high amounts of sugar, saturated fats, and salt. Take vitamin and mineral supplements as recommended by your health care provider. Do not drink alcohol if your health care provider tells you not to drink. If you drink alcohol: Limit how much you have to 0-1 drink a day. Know how much alcohol is in your drink. In the [place] one drink equals one 12 oz bottle of beer (355 mL), one 5 oz glass of wine (148 mL), or one 1 oz glass of hard liquor (44 mL). Lifestyle Brush your teeth every morning and night with fluoride toothpaste. Floss one time each day. Exercise for at least 30 minutes 5 or more days each week. Do not use any products that contain nicotine or tobacco. These products include cigarettes, chewing tobacco, and vaping devices, such as e-cigarettes. If you need help quitting, ask your health care provider. Do not use drugs. If you are sexually active, practice safe sex. Use a condom or other form of protection in order to prevent STIs. Take aspirin only as told by  your health care provider. Make sure that you understand how much to take and what form to take. Work with your health care provider to find out whether it  is safe and beneficial for you to take aspirin daily. Ask your health care provider if you need to take a cholesterol-lowering medicine (statin). Find healthy ways to manage stress, such as: Meditation, yoga, or listening to music. Journaling. Talking to a trusted person. Spending time with friends and family. Minimize exposure to UV radiation to reduce your risk of skin cancer. Safety Always wear your seat belt while driving or riding in a vehicle. Do not drive: If you have been drinking alcohol. Do not ride with someone who has been drinking. When you are tired or distracted. While texting. If you have been using any mind-altering substances or drugs. Wear a helmet and other protective equipment during sports activities. If you have firearms in your house, make sure you follow all gun safety procedures. What's next? Visit your health care provider once a year for an annual wellness visit. Ask your health care provider how often you should have your eyes and teeth checked. Stay up to date on all vaccines. This information is not intended to replace advice given to you by your health care provider. Make sure you discuss any questions you have with your health care provider. Document Revised: 12/24/2020 Document Reviewed: 12/24/2020 [employer] Patient Education  2024 [employer].

## 2023-10-21 NOTE — Progress Notes (Signed)
 Established Patient Office Visit  Subjective   Patient ID: [name], female    DOB: 10-11-1955  Age: 68 y.o. MRN: [id number]  Chief Complaint  Patient presents with   Annual Exam    Pt is fasting.     HPI Discussed the use of AI scribe software for clinical note transcription with the patient, who gave verbal consent to proceed.  History of Present Illness DENIM START is a 68 year old female who presents with a sinus infection and recent COVID-19 diagnosis.  She developed a sinus infection after visiting her sick son-in-law. Initially, she sought care at an urgent care center in [place], where she was diagnosed with a sinus infection but was not prescribed cough medicine. Tessalon Perles were ineffective for her. Her symptoms worsened by the following Sunday, with thick green nasal discharge, prompting a return to urgent care where she was given cough medicine. That night, she developed fever and chills, which persisted for two days. On the following Tuesday, she was seen by another provider and tested positive for COVID-19, despite her only recent outings being to the pharmacy and urgent care. She has been using an inhaler, cough medicine, and Tylenol to manage her symptoms. She continues to use cough medicine at night due to coughing when she gets hot. Fever and chills are associated with her recent illness.  She is currently on Cimzia for rheumatoid arthritis and reports that her recent lab work, including C-reactive protein, CBC, and metabolic panel, were normal. She notes improvement in her joint symptoms and is using less Voltaren gel and no longer using a patch on her right shoulder.  She is currently taking duloxetine, rosuvastatin, temazepam, and has requested a refill for these medications. She experiences occasional nausea, particularly after eating certain foods like Chinese rice, and uses Zofran as needed.  She has lost 25 pounds over the past year and is  trying to maintain a steady weight loss. She is due for a follow-up with her eye doctor due to changes in her RA medication. She has had recent screenings, including a mammogram and colonoscopy, and is up to date with dental visits.  She has a history of a stress fracture in her toes from October 2023, for which she used a boot and buddy taping. Her middle toe does not straighten out like the others, but it is not significantly bothersome. Her husband is not yet retired and is two years away from full retirement benefits.   Patient Active Problem List   Diagnosis Date Noted   Panniculitis 03/15/2023   Unilateral primary osteoarthritis, right knee 01/24/2023   Family history of abdominal aortic aneurysm (AAA) repair 07/23/2022   Synovitis of toe 04/19/2022   Preventative health care 10/22/2021   Colon cancer screening 08/05/2021   Diabetic ulcer of toe of right foot associated with type 2 diabetes mellitus, limited to breakdown of skin (HCC) 06/19/2021   Tinea corporis 06/19/2021   Dysuria 06/19/2021   IDA (iron deficiency anemia) 05/18/2021   Pre-operative clearance 04/21/2021   Controlled type 2 diabetes mellitus with hyperglycemia, without long-term current use of insulin (HCC) 04/21/2021   Spinal stenosis of lumbar region 04/21/2021   Bronchitis due to COVID-19 virus 03/27/2021   Orthostatic hypotension 01/09/2021   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   Spondylosis of cervical spine 10/07/2020   Lumbar radiculopathy 10/07/2020   Anemia 09/11/2020   Intractable migraine 08/26/2020   Inflammatory arthritis 07/22/2020   Thrombocytopenia (HCC) 07/22/2020  Chronic nonintractable headache 07/21/2020   Syncope 07/21/2020   Dizziness 07/21/2020   Cellulitis of right hand 07/21/2020   Lumbar spondylolysis 05/13/2020   Primary hypertension 04/02/2020   Body mass index (BMI) 29.0-29.9, adult 04/02/2020   Menopausal symptom 03/11/2020   Stress 03/11/2020   Unspecified  dyspareunia (CODE) 03/11/2020   Bilateral sacroiliitis (HCC) 03/04/2020   Senile calcific aortic valve sclerosis 02/08/2020   DOE (dyspnea on exertion) 02/08/2020   Murmur 01/07/2020   Acquired trigger finger of right ring finger 10/25/2019   Pain in right knee 08/24/2019   Rheumatoid arthritis involving multiple sites with positive rheumatoid factor (HCC) 08/02/2019   Primary osteoarthritis of both knees 08/02/2019   PTSD (post-traumatic stress disorder) 02/08/2019   Dog bite of arm, right, initial encounter 10/05/2018   Hyperlipidemia associated with type 2 diabetes mellitus (HCC) 08/24/2018   Symptomatic abdominal panniculus 12/12/2017   Carpal tunnel syndrome of left wrist 11/28/2017   Pain in right hand 11/08/2017   Cervical radiculopathy 10/06/2017   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   Tibial fracture 10/14/2016   Keratosis 12/04/2014   Onychocryptosis 08/21/2014   Onychomycosis 05/21/2014   Fissured skin 01/22/2014   Fissure in skin of foot 12/18/2013   Porokeratosis 12/18/2013   Changes in skin texture 12/18/2013   Pain in lower limb 11/21/2013   Ingrown nail 07/20/2013   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 07/02/2013   Obesity (BMI 30-39.9) 06/25/2013   GERD (gastroesophageal reflux disease) 08/20/2011   Lapband APL + HH repair June 2011-Major revision Dec 2013 06/29/2011   Abdominal pain 08/01/2010   VITAMIN B12 DEFICIENCY 04/01/2010   BARIATRIC SURGERY STATUS 01/29/2010   ONYCHOMYCOSIS, BILATERAL 06/30/2009   STRESS INCONTINENCE 06/10/2009   ACUTE PHARYNGITIS 04/29/2009   COUGH 04/27/2009   BRONCHITIS, ACUTE 04/16/2009   NAUSEA 04/08/2009   DIARRHEA 04/08/2009   MORBID OBESITY 03/03/2009   SKIN RASH 11/18/2008   UNSPECIFIED VITAMIN D DEFICIENCY 07/15/2008   OTHER SPECIFIED ANEMIAS 07/15/2008   Pain in joint, multiple sites 06/13/2008   Myalgia and myositis, unspecified 06/13/2008   Myalgia 06/13/2008   BACK PAIN, LUMBAR 02/14/2008   ACUTE  SINUSITIS, UNSPECIFIED 09/01/2007   CELLULITIS 08/12/2007   Cellulitis 08/12/2007   Depression with anxiety 06/09/2007   DEPRESSION 06/09/2007   Controlled type 2 diabetes mellitus with diabetic neuropathy, without long-term current use of insulin (HCC) 01/04/2007   Labile hypertension 01/04/2007   ALLERGIC RHINITIS 01/04/2007   HEADACHE 01/04/2007   Anxiety 12/02/1998   Past Medical History:  Diagnosis Date   Allergic rhinitis    Anxiety    Asthma    hx bronchial asthma at times with upper resp infection   Depression    Fibromyalgia    GERD (gastroesophageal reflux disease)    Headache(784.0)    migraine and cluster headaches   Hyperlipemia    Hypertension    IBS (irritable bowel syndrome)    Menopause    Neuromuscular disorder (HCC) 2009   hx of fibromyalgia, polyarthralsia (surgery induced)   Osteoarthritis    Rheumatoid arthritis (HCC)    Complicated by osteoarthritis as well.   Rheumatoid arthritis (HCC)    Stress incontinence    at times   Tibial fracture 10/14/2016   evulsion periostial right   Type 2 diabetes mellitus with complication, without long-term current use of insulin (HCC)    diet controlled no meds -> however was diagnosed with a diabetic foot ulcer.   Past Surgical History:  Procedure Laterality Date  ABDOMINAL HYSTERECTOMY  12/1999   complete   CARPAL TUNNEL RELEASE Right 12/27/2017   Procedure: CARPAL TUNNEL RELEASE;  Surgeon: Dominica Severin, MD;  Location: [facility];  Service: Orthopedics;  Laterality: Right;   CERVICAL CONIZATION W/BX  june 1990   dysplasia of cervix/used 5Fu cream for 3 months   CERVICAL LAMINECTOMY  2005 & 2009   X 2   NO ROM PROBLEMS   CESAREAN SECTION  1989 & 1993   X 2   CHOLECYSTECTOMY  1986   DILATION AND CURETTAGE OF UTERUS   [phone]   missed abortion   ESOPHAGOGASTRODUODENOSCOPY  06/29/2011   Procedure: ESOPHAGOGASTRODUODENOSCOPY (EGD);  Surgeon: Kandis Cocking, MD;  Location: [facility];  Service: General;   Laterality: N/A;   FOOT SURGERY  2005   RT HEEL   GASTRIC BANDING PORT REVISION  09/24/2011   Procedure: GASTRIC BANDING PORT REVISION;  Surgeon: Valarie Merino, MD;  Location: [facility];  Service: General;  Laterality: N/A;   GASTRIC ROUX-EN-Y N/A 06/06/2017   Procedure: 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;  Surgeon: Luretha Murphy, MD;  Location: [facility];  Service: General;  Laterality: N/A;   HYSTEROSCOPY  1999   LAPAROSCOPIC GASTRIC BANDING  12/30/09   LAPAROSCOPIC GASTRIC SLEEVE RESECTION N/A 07/02/2013   Procedure: LAPAROSCOPIC GASTRIC SLEEVE RESECTION upper endoscopy;  Surgeon: Valarie Merino, MD;  Location: [facility];  Service: General;  Laterality: N/A;   LAPAROSCOPIC REPAIR AND REMOVAL OF GASTRIC BAND N/A 07/02/2013   Procedure: LAPAROSCOPIC REMOVAL OF GASTRIC BAND;  Surgeon: Valarie Merino, MD;  Location: [facility];  Service: General;  Laterality: N/A;   LAPAROSCOPIC REVISION OF GASTRIC BAND  07/03/2012   Procedure: LAPAROSCOPIC REVISION OF GASTRIC BAND;  Surgeon: Valarie Merino, MD;  Location: [facility];  Service: General;  Laterality: N/A;  removal of old lap. band port, replaced with AP standard band   LAPAROSCOPY  09/24/2011   Procedure: LAPAROSCOPY DIAGNOSTIC;  Surgeon: Valarie Merino, MD;  Location: [facility];  Service: General;  Laterality: N/A;   LAPAROSCOPY  07/03/2012   Procedure: LAPAROSCOPY DIAGNOSTIC;  Surgeon: Valarie Merino, MD;  Location: [facility];  Service: General;  Laterality: N/A;  Exploratory Laparoscopy    MESH APPLIED TO LAP PORT  07/03/2012   Procedure: MESH APPLIED TO LAP PORT;  Surgeon: Valarie Merino, MD;  Location: [facility];  Service: General;  Laterality: N/A;   NASAL SINUS SURGERY  1995 & 1997   X 2   right knee  1981   ARTHROSCOPY AND ARTHROTOMY   right knee arthroscopy and arthrotomy  12-1979   TONSILLECTOMY  1971   TUBAL LIGATION  1993   WITH C -SECTION   Social History   Tobacco Use   Smoking status:  Never   Smokeless tobacco: Never  Vaping Use   Vaping status: Never Used  Substance Use Topics   Alcohol use: Not Currently   Drug use: Never   Social History   Socioeconomic History   Marital status: Married    Spouse name: Yarixa Lightcap   Number of children: 2   Years of education: Not on file   Highest education level: Bachelor's degree (e.g., BA, AB, BS)  Occupational History   Occupation: Teacher, adult education: [employer]  Tobacco Use   Smoking status: Never   Smokeless tobacco: Never  Vaping Use   Vaping status: Never Used  Substance and Sexual Activity   Alcohol  use: Not Currently   Drug use: Never   Sexual activity: Yes    Birth control/protection: None  Other Topics Concern   Not on file  Social History Narrative   Right handed   Two story home   Drinks caffeine occasionally   Social Drivers of Health   Financial Resource Strain: Low Risk  (04/22/2023)   Overall Financial Resource Strain (CARDIA)    Difficulty of Paying Living Expenses: Not hard at all  Food Insecurity: Unknown (09/07/2023)   Received [facility]   Hunger Vital Sign    Worried About Running Out of Food in the Last Year: Never true    Ran Out of Food in the Last Year: Not on file  Transportation Needs: No Transportation Needs (09/07/2023)   Received from [facility] - Transportation    Lack of Transportation (Medical): No    Lack of Transportation (Non-Medical): No  Physical Activity: Insufficiently Active (09/07/2023)   Received from [facility]   Exercise Vital Sign    Days of Exercise per Week: 2 days    Minutes of Exercise per Session: 20 min  Stress: No Stress Concern Present (04/22/2023)   [employer] [facility] [employer] - Occupational Stress Questionnaire    Feeling of Stress : Not at all  Social Connections: Socially Integrated (09/07/2023)   Received from [facility]   Social Network    How would you rate your social network (family,  work, friends)?: Good participation with social networks  Intimate Partner Violence: Not At Risk (09/07/2023)   Received from [facility]   HITS    Over the last 12 months how often did your partner physically hurt you?: Never    Over the last 12 months how often did your partner insult you or talk down to you?: Never    Over the last 12 months how often did your partner threaten you with physical harm?: Never    Over the last 12 months how often did your partner scream or curse at you?: Never   Family Status  Relation Name Status   Mother  Deceased   Father  Deceased at age 58   Other  (Not Specified)   Other  (Not Specified)  No partnership data on file   Family History  Problem Relation Age of Onset   Diabetes Mother    Stroke Mother    Breast cancer Mother    Atrial fibrillation Mother    Hypertension Mother    Cancer Mother 48       breast   Diabetes Father    Stroke Father 30       2nd 6 month apart   Hypertension Father    Heart attack Father 64       stents   Dementia Father    AAA (abdominal aortic aneurysm) Father 86       repair   CAD Father 5   Thyroid disease Other    Heart disease Other    COPD Other    Allergies  Allergen Reactions   Gabapentin Swelling    Pt states she has eye swelling    Isoniazid Shortness Of Breath   Morphine Hives and Nausea And Vomiting    Allergic to PCA pump only    Nitrofurantoin Shortness Of Breath, Rash and Other (See Comments)    REACTION: welts   Tamiflu [Oseltamivir Phosphate] Nausea And Vomiting    "I vomited within 30 minutes of taking it and was  told I cannot ever take it again."   Hctz [Hydrochlorothiazide] Rash   Macrolides And Ketolides Rash   Promethazine Other (See Comments)    Other Reaction(s): Psychosis  IF GIVEN  IV-hallucinations     CAN TAKE PO PHENERGAN, , Other reaction(s): Hallucinations, IF GIVEN  IV-hallucinations     CAN TAKE PO PHENERGAN, Other reaction(s): Unknown   Wound Dressing Adhesive  Rash    Adhesive Tape    Fentanyl Hives, Itching and Rash    REACTION: welts   Sulfamethoxazole-Trimethoprim Rash      Review of Systems  Constitutional:  Negative for chills, fever and malaise/fatigue.  HENT:  Negative for congestion and hearing loss.   Eyes:  Negative for blurred vision and discharge.  Respiratory:  Negative for cough, sputum production and shortness of breath.   Cardiovascular:  Negative for chest pain, palpitations and leg swelling.  Gastrointestinal:  Negative for abdominal pain, blood in stool, constipation, diarrhea, heartburn, nausea and vomiting.  Genitourinary:  Negative for dysuria, frequency, hematuria and urgency.  Musculoskeletal:  Negative for back pain, falls and myalgias.  Skin:  Negative for rash.  Neurological:  Negative for dizziness, sensory change, loss of consciousness, weakness and headaches.  Endo/Heme/Allergies:  Negative for environmental allergies. Does not bruise/bleed easily.  Psychiatric/Behavioral:  Negative for depression and suicidal ideas. The patient is not nervous/anxious and does not have insomnia.       Objective:     BP 118/62   Pulse 77   Wt 187 lb (84.8 kg)   SpO2 98%   BMI 27.62 kg/m  BP Readings from Last 3 Encounters:  10/21/23 118/62  10/04/23 (!) 118/54  10/02/23 129/76   Wt Readings from Last 3 Encounters:  10/21/23 187 lb (84.8 kg)  10/04/23 194 lb 9.6 oz (88.3 kg)  09/21/23 194 lb (88 kg)   SpO2 Readings from Last 3 Encounters:  10/21/23 98%  10/04/23 98%  10/02/23 99%      Physical Exam Vitals and nursing note reviewed.  Constitutional:      General: She is not in acute distress.    Appearance: Normal appearance. She is well-developed.  HENT:     Head: Normocephalic and atraumatic.     Right Ear: Tympanic membrane, ear canal and external ear normal. There is no impacted cerumen.     Left Ear: Tympanic membrane, ear canal and external ear normal. There is no impacted cerumen.     Nose: Nose  normal.     Mouth/Throat:     Mouth: Mucous membranes are moist.     Pharynx: Oropharynx is clear. No oropharyngeal exudate or posterior oropharyngeal erythema.  Eyes:     General: No scleral icterus.       Right eye: No discharge.        Left eye: No discharge.     Conjunctiva/sclera: Conjunctivae normal.     Pupils: Pupils are equal, round, and reactive to light.  Neck:     Thyroid: No thyromegaly or thyroid tenderness.     Vascular: No JVD.  Cardiovascular:     Rate and Rhythm: Normal rate and regular rhythm.     Heart sounds: Normal heart sounds. No murmur heard. Pulmonary:     Effort: Pulmonary effort is normal. No respiratory distress.     Breath sounds: Normal breath sounds.  Abdominal:     General: Bowel sounds are normal. There is no distension.     Palpations: Abdomen is soft. There is no mass.  Tenderness: There is no abdominal tenderness. There is no guarding or rebound.  Musculoskeletal:        General: Normal range of motion.     Cervical back: Normal range of motion and neck supple.     Right lower leg: No edema.     Left lower leg: No edema.  Lymphadenopathy:     Cervical: No cervical adenopathy.  Skin:    General: Skin is warm and dry.     Findings: No erythema or rash.  Neurological:     Mental Status: She is alert and oriented to person, place, and time.     Cranial Nerves: No cranial nerve deficit.     Deep Tendon Reflexes: Reflexes are normal and symmetric.  Psychiatric:        Mood and Affect: Mood normal.        Behavior: Behavior normal.        Thought Content: Thought content normal.        Judgment: Judgment normal.      No results found for any visits on 10/21/23.  Last CBC Lab Results  Component Value Date   WBC 6.6 09/12/2023   HGB 13.1 09/12/2023   HCT 40.9 09/12/2023   MCV 92.5 09/12/2023   MCH 29.6 09/12/2023   RDW 12.7 09/12/2023   PLT 173 09/12/2023   Last metabolic panel Lab Results  Component Value Date   GLUCOSE 89  09/12/2023   NA 142 09/12/2023   K 4.1 09/12/2023   CL 101 09/12/2023   CO2 35 (H) 09/12/2023   BUN 13 09/12/2023   CREATININE 0.88 09/12/2023   GFRNONAA >60 09/12/2023   CALCIUM 9.6 09/12/2023   PROT 7.1 09/12/2023   ALBUMIN 4.1 09/12/2023   BILITOT 0.6 09/12/2023   ALKPHOS 65 09/12/2023   AST 35 09/12/2023   ALT 28 09/12/2023   ANIONGAP 6 09/12/2023   Last lipids Lab Results  Component Value Date   CHOL 118 04/22/2023   HDL 70.00 04/22/2023   LDLCALC 38 04/22/2023   TRIG 50.0 04/22/2023   CHOLHDL 2 04/22/2023   Last hemoglobin A1c Lab Results  Component Value Date   HGBA1C 5.5 04/22/2023   Last thyroid functions Lab Results  Component Value Date   TSH 2.02 10/22/2022   Last vitamin D Lab Results  Component Value Date   VD25OH 42.15 04/22/2023   Last vitamin B12 and Folate Lab Results  Component Value Date   VITAMINB12 234 04/22/2023   FOLATE 18.1 05/11/2021      The ASCVD Risk score (Arnett DK, et al., 2019) failed to calculate for the following reasons:   The valid total cholesterol range is 130 to 320 mg/dL    Assessment & Plan:   Problem List Items Addressed This Visit       Unprioritized   Depression with anxiety   Relevant Medications   DULoxetine (CYMBALTA) 30 MG capsule   UNSPECIFIED VITAMIN D DEFICIENCY   Rheumatoid arthritis involving multiple sites with positive rheumatoid factor (HCC)   Preventative health care - Primary   Ghm utd Check labs  See AVS Health Maintenance  Topic Date Due   HEMOGLOBIN A1C  10/21/2023   COVID-19 Vaccine (13 - Mixed Product risk 2024-25 season) 07/11/2024 (Originally 09/18/2023)   INFLUENZA VACCINE  02/10/2024   Diabetic kidney evaluation - Urine ACR  04/21/2024   OPHTHALMOLOGY EXAM  05/10/2024   MAMMOGRAM  08/21/2024   Diabetic kidney evaluation - eGFR measurement  09/11/2024   FOOT EXAM  09/20/2024   Colonoscopy  03/08/2028   DTaP/Tdap/Td (3 - Td or Tdap) 10/03/2028   Pneumonia Vaccine 65+ Years  old  Completed   DEXA SCAN  Completed   Hepatitis C Screening  Completed   Zoster Vaccines- Shingrix  Completed   HPV VACCINES  Aged Out   Meningococcal B Vaccine  Aged Out         Hyperlipidemia associated with type 2 diabetes mellitus (HCC) (Chronic)   Tolerating statin, encouraged heart healthy diet, avoid trans fats, minimize simple carbs and saturated fats. Increase exercise as tolerated       Relevant Medications   tirzepatide (MOUNJARO) 15 MG/0.5ML Pen   rosuvastatin (CRESTOR) 20 MG tablet   Controlled type 2 diabetes mellitus with diabetic neuropathy, without long-term current use of insulin (HCC)   hgba1c to be checked,  minimize simple carbs. Increase exercise as tolerated. Continue current meds       Relevant Medications   tirzepatide (MOUNJARO) 15 MG/0.5ML Pen   rosuvastatin (CRESTOR) 20 MG tablet   Other Visit Diagnoses       Mild intermittent asthma with exacerbation         Essential hypertension       Relevant Medications   rosuvastatin (CRESTOR) 20 MG tablet   Other Relevant Orders   Hemoglobin A1c   Microalbumin / creatinine urine ratio   TSH     Hyperlipidemia, unspecified hyperlipidemia type       Relevant Medications   rosuvastatin (CRESTOR) 20 MG tablet   Other Relevant Orders   Lipid panel     Controlled type 2 diabetes mellitus with complication, without long-term current use of insulin (HCC)       Relevant Medications   tirzepatide (MOUNJARO) 15 MG/0.5ML Pen   rosuvastatin (CRESTOR) 20 MG tablet   Other Relevant Orders   Hemoglobin A1c   Microalbumin / creatinine urine ratio     Vitamin B12 deficiency         Nausea       Relevant Medications   ondansetron (ZOFRAN) 4 MG tablet     Other insomnia       Relevant Medications   temazepam (RESTORIL) 30 MG capsule     Assessment and Plan Assessment & Plan COVID-19   Tested positive for COVID-19 with persistent fever and chills, likely contracted from exposure at a pharmacy or urgent care.  Symptoms require ongoing use of an inhaler and cough medicine. Continue inhaler and cough medicine as needed. Use acetaminophen for fever management.  Sinusitis   Sinus infection with thick green nasal discharge, fever, and chills, following exposure to a sick family member. Initial treatment with Tessalon Perles was ineffective. COVID-19 diagnosis complicates sinusitis, necessitating continued inhaler and cough medicine use. Continue inhaler and cough medicine as needed. Use acetaminophen for fever management.  Rheumatoid Arthritis   On Cimzia with effective control of rheumatoid arthritis, as indicated by normal SGOT and C-reactive protein levels. Joint symptoms have improved, and use of Voltaren gel and shoulder patch has reduced. Regular rheumatology follow-up is planned. Continue Cimzia as prescribed. Follow up with rheumatologist at the end of the month.  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 Regular podiatry visits for callus management are ongoing. Previous stress fracture in October 2023 with residual toe stiffness. Surgical intervention is not planned unless symptoms worsen. Continue regular podiatry visits every three months for callus management.  Medication Management   Requires refills for duloxetine, rosuvastatin, temazepam, and ondansetron  for occasional nausea. Requested increase in Mojaro to 15 mg. Refill duloxetine, rosuvastatin, temazepam, and ondansetron. Increase Mojaro to 15 mg.  General Health Maintenance   Maintained 25-pound weight loss over the past year. Recent mammogram and colonoscopy are up to date. Regular dental and ophthalmologic visits are scheduled. Under husband's insurance and has Medicare Part A. Discussed limitations on cholesterol testing frequency under Medicare unless medication changes. Continue weight management efforts. Schedule next dental visit in one month. Follow up with ophthalmologist in a couple of  weeks.    Return in about 6 months (around 04/21/2024) for hyperlipidemia, hypertension, diabetes II.    Donato Schultz, DO

## 2023-10-21 NOTE — Assessment & Plan Note (Signed)
 Ghm utd Check labs  See AVS Health Maintenance  Topic Date Due   HEMOGLOBIN A1C  10/21/2023   COVID-19 Vaccine (13 - Mixed Product risk 2024-25 season) 07/11/2024 (Originally 09/18/2023)   INFLUENZA VACCINE  02/10/2024   Diabetic kidney evaluation - Urine ACR  04/21/2024   OPHTHALMOLOGY EXAM  05/10/2024   MAMMOGRAM  08/21/2024   Diabetic kidney evaluation - eGFR measurement  09/11/2024   FOOT EXAM  09/20/2024   Colonoscopy  03/08/2028   DTaP/Tdap/Td (3 - Td or Tdap) 10/03/2028   Pneumonia Vaccine 43+ Years old  Completed   DEXA SCAN  Completed   Hepatitis C Screening  Completed   Zoster Vaccines- Shingrix  Completed   HPV VACCINES  Aged Out   Meningococcal B Vaccine  Aged Out

## 2023-10-21 NOTE — Assessment & Plan Note (Signed)
 Tolerating statin, encouraged heart healthy diet, avoid trans fats, minimize simple carbs and saturated fats. Increase exercise as tolerated

## 2023-10-25 ENCOUNTER — Other Ambulatory Visit (HOSPITAL_BASED_OUTPATIENT_CLINIC_OR_DEPARTMENT_OTHER): Payer: Self-pay

## 2023-10-28 ENCOUNTER — Encounter: Payer: Self-pay | Admitting: Family Medicine

## 2023-10-31 ENCOUNTER — Telehealth: Payer: Self-pay | Admitting: Pharmacy Technician

## 2023-10-31 ENCOUNTER — Other Ambulatory Visit (HOSPITAL_COMMUNITY): Payer: Self-pay

## 2023-10-31 NOTE — Telephone Encounter (Signed)
 Pharmacy Patient Advocate Encounter   Received notification from CoverMyMeds that prior authorization for UBRELVY  100MG  is required/requested.   Insurance verification completed.   The patient is insured through [facility] .   Per test claim: PA required; PA submitted to above mentioned insurance via CoverMyMeds Key/confirmation #/EOC [id number] Status is pending

## 2023-10-31 NOTE — Telephone Encounter (Signed)
 Unable to submit PA at this moment due to active PA already on file

## 2023-11-09 ENCOUNTER — Other Ambulatory Visit (HOSPITAL_BASED_OUTPATIENT_CLINIC_OR_DEPARTMENT_OTHER): Payer: Self-pay

## 2023-11-09 DIAGNOSIS — M549 Dorsalgia, unspecified: Secondary | ICD-10-CM | POA: Diagnosis not present

## 2023-11-09 DIAGNOSIS — M1990 Unspecified osteoarthritis, unspecified site: Secondary | ICD-10-CM | POA: Diagnosis not present

## 2023-11-09 DIAGNOSIS — R748 Abnormal levels of other serum enzymes: Secondary | ICD-10-CM | POA: Diagnosis not present

## 2023-11-09 DIAGNOSIS — M2550 Pain in unspecified joint: Secondary | ICD-10-CM | POA: Diagnosis not present

## 2023-11-09 DIAGNOSIS — Z79899 Other long term (current) drug therapy: Secondary | ICD-10-CM | POA: Diagnosis not present

## 2023-11-09 DIAGNOSIS — M797 Fibromyalgia: Secondary | ICD-10-CM | POA: Diagnosis not present

## 2023-11-09 DIAGNOSIS — M0609 Rheumatoid arthritis without rheumatoid factor, multiple sites: Secondary | ICD-10-CM | POA: Diagnosis not present

## 2023-11-09 DIAGNOSIS — M8580 Other specified disorders of bone density and structure, unspecified site: Secondary | ICD-10-CM | POA: Diagnosis not present

## 2023-11-09 DIAGNOSIS — M79643 Pain in unspecified hand: Secondary | ICD-10-CM | POA: Diagnosis not present

## 2023-11-09 DIAGNOSIS — G8929 Other chronic pain: Secondary | ICD-10-CM | POA: Diagnosis not present

## 2023-11-09 MED ORDER — HYDROXYCHLOROQUINE SULFATE 200 MG PO TABS
400.0000 mg | ORAL_TABLET | Freq: Every day | ORAL | 0 refills | Status: DC
  Filled 2023-11-09: qty 180, 90d supply, fill #0

## 2023-11-09 MED ORDER — PREDNISONE 5 MG PO TABS
ORAL_TABLET | ORAL | 0 refills | Status: AC
  Filled 2023-11-09: qty 42, 12d supply, fill #0

## 2023-11-13 ENCOUNTER — Other Ambulatory Visit (HOSPITAL_BASED_OUTPATIENT_CLINIC_OR_DEPARTMENT_OTHER): Payer: Self-pay

## 2023-11-14 ENCOUNTER — Other Ambulatory Visit (HOSPITAL_BASED_OUTPATIENT_CLINIC_OR_DEPARTMENT_OTHER): Payer: Self-pay

## 2023-11-14 ENCOUNTER — Other Ambulatory Visit: Payer: Self-pay

## 2023-11-15 ENCOUNTER — Other Ambulatory Visit (HOSPITAL_BASED_OUTPATIENT_CLINIC_OR_DEPARTMENT_OTHER): Payer: Self-pay

## 2023-11-15 DIAGNOSIS — Z79899 Other long term (current) drug therapy: Secondary | ICD-10-CM | POA: Diagnosis not present

## 2023-11-15 DIAGNOSIS — H2511 Age-related nuclear cataract, right eye: Secondary | ICD-10-CM | POA: Diagnosis not present

## 2023-11-15 DIAGNOSIS — E119 Type 2 diabetes mellitus without complications: Secondary | ICD-10-CM | POA: Diagnosis not present

## 2023-11-15 DIAGNOSIS — H25812 Combined forms of age-related cataract, left eye: Secondary | ICD-10-CM | POA: Diagnosis not present

## 2023-11-15 LAB — HM DIABETES EYE EXAM

## 2023-11-16 DIAGNOSIS — M0609 Rheumatoid arthritis without rheumatoid factor, multiple sites: Secondary | ICD-10-CM | POA: Diagnosis not present

## 2023-11-22 ENCOUNTER — Other Ambulatory Visit: Payer: Self-pay

## 2023-11-22 ENCOUNTER — Other Ambulatory Visit (HOSPITAL_BASED_OUTPATIENT_CLINIC_OR_DEPARTMENT_OTHER): Payer: Self-pay

## 2023-11-22 ENCOUNTER — Other Ambulatory Visit: Payer: Self-pay | Admitting: Family Medicine

## 2023-11-22 DIAGNOSIS — R110 Nausea: Secondary | ICD-10-CM

## 2023-11-22 MED ORDER — ONDANSETRON HCL 4 MG PO TABS
4.0000 mg | ORAL_TABLET | Freq: Three times a day (TID) | ORAL | 2 refills | Status: DC | PRN
  Filled 2023-11-22: qty 20, 7d supply, fill #0
  Filled 2024-02-03: qty 20, 7d supply, fill #1

## 2023-11-29 ENCOUNTER — Other Ambulatory Visit (HOSPITAL_BASED_OUTPATIENT_CLINIC_OR_DEPARTMENT_OTHER): Payer: Self-pay

## 2023-11-29 DIAGNOSIS — M797 Fibromyalgia: Secondary | ICD-10-CM | POA: Diagnosis not present

## 2023-11-29 DIAGNOSIS — M7918 Myalgia, other site: Secondary | ICD-10-CM | POA: Diagnosis not present

## 2023-11-29 DIAGNOSIS — M1612 Unilateral primary osteoarthritis, left hip: Secondary | ICD-10-CM | POA: Diagnosis not present

## 2023-11-29 DIAGNOSIS — M4326 Fusion of spine, lumbar region: Secondary | ICD-10-CM | POA: Diagnosis not present

## 2023-11-29 DIAGNOSIS — G894 Chronic pain syndrome: Secondary | ICD-10-CM | POA: Diagnosis not present

## 2023-11-29 DIAGNOSIS — M4726 Other spondylosis with radiculopathy, lumbar region: Secondary | ICD-10-CM | POA: Diagnosis not present

## 2023-11-29 DIAGNOSIS — Z981 Arthrodesis status: Secondary | ICD-10-CM | POA: Diagnosis not present

## 2023-11-29 DIAGNOSIS — M5432 Sciatica, left side: Secondary | ICD-10-CM | POA: Diagnosis not present

## 2023-11-29 MED ORDER — TRAMADOL HCL 50 MG PO TABS
50.0000 mg | ORAL_TABLET | Freq: Three times a day (TID) | ORAL | 2 refills | Status: DC | PRN
  Filled 2023-11-29: qty 90, 30d supply, fill #0
  Filled 2024-02-20: qty 90, 30d supply, fill #1

## 2023-11-29 MED ORDER — CYCLOBENZAPRINE HCL 10 MG PO TABS
10.0000 mg | ORAL_TABLET | Freq: Every day | ORAL | 1 refills | Status: DC
Start: 2023-11-29 — End: 2024-04-23
  Filled 2023-11-29: qty 90, 90d supply, fill #0
  Filled 2024-02-29: qty 90, 90d supply, fill #1

## 2023-11-29 MED ORDER — METHOCARBAMOL 500 MG PO TABS
500.0000 mg | ORAL_TABLET | Freq: Four times a day (QID) | ORAL | 1 refills | Status: AC | PRN
  Filled 2023-11-29: qty 360, 90d supply, fill #0

## 2023-11-30 ENCOUNTER — Other Ambulatory Visit (HOSPITAL_BASED_OUTPATIENT_CLINIC_OR_DEPARTMENT_OTHER): Payer: Self-pay

## 2023-11-30 DIAGNOSIS — M1612 Unilateral primary osteoarthritis, left hip: Secondary | ICD-10-CM | POA: Diagnosis not present

## 2023-11-30 MED ORDER — PREGABALIN 150 MG PO CAPS
150.0000 mg | ORAL_CAPSULE | Freq: Three times a day (TID) | ORAL | 0 refills | Status: DC
  Filled 2023-11-30 – 2023-12-02 (×2): qty 270, 90d supply, fill #0

## 2023-12-02 ENCOUNTER — Telehealth: Payer: Self-pay | Admitting: Family Medicine

## 2023-12-02 ENCOUNTER — Other Ambulatory Visit (HOSPITAL_BASED_OUTPATIENT_CLINIC_OR_DEPARTMENT_OTHER): Payer: Self-pay

## 2023-12-02 ENCOUNTER — Other Ambulatory Visit: Payer: Self-pay

## 2023-12-02 NOTE — Telephone Encounter (Signed)
 Pt dropped off copy of labs for pcp from rheumatologist. Pt does not need back. Placed in pcp's box.

## 2023-12-02 NOTE — Telephone Encounter (Signed)
 Placed in folder for review

## 2023-12-08 ENCOUNTER — Other Ambulatory Visit (HOSPITAL_BASED_OUTPATIENT_CLINIC_OR_DEPARTMENT_OTHER): Payer: Self-pay

## 2023-12-08 ENCOUNTER — Other Ambulatory Visit: Payer: Self-pay

## 2023-12-09 ENCOUNTER — Other Ambulatory Visit (HOSPITAL_BASED_OUTPATIENT_CLINIC_OR_DEPARTMENT_OTHER): Payer: Self-pay

## 2023-12-09 ENCOUNTER — Other Ambulatory Visit: Payer: Self-pay

## 2023-12-09 MED ORDER — ESTRADIOL 0.1 MG/24HR TD PTTW
1.0000 | MEDICATED_PATCH | TRANSDERMAL | 4 refills | Status: AC
  Filled 2024-06-11: qty 24, 84d supply, fill #0

## 2023-12-12 ENCOUNTER — Other Ambulatory Visit (HOSPITAL_BASED_OUTPATIENT_CLINIC_OR_DEPARTMENT_OTHER): Payer: Self-pay

## 2023-12-14 DIAGNOSIS — M0609 Rheumatoid arthritis without rheumatoid factor, multiple sites: Secondary | ICD-10-CM | POA: Diagnosis not present

## 2023-12-15 ENCOUNTER — Encounter: Payer: Self-pay | Admitting: Family Medicine

## 2023-12-15 ENCOUNTER — Ambulatory Visit: Admitting: Family Medicine

## 2023-12-15 VITALS — BP 136/74 | Ht 69.5 in | Wt 177.0 lb

## 2023-12-15 DIAGNOSIS — M25511 Pain in right shoulder: Secondary | ICD-10-CM

## 2023-12-15 DIAGNOSIS — M797 Fibromyalgia: Secondary | ICD-10-CM | POA: Diagnosis not present

## 2023-12-15 DIAGNOSIS — G894 Chronic pain syndrome: Secondary | ICD-10-CM | POA: Diagnosis not present

## 2023-12-15 DIAGNOSIS — M19011 Primary osteoarthritis, right shoulder: Secondary | ICD-10-CM | POA: Diagnosis not present

## 2023-12-15 DIAGNOSIS — M0579 Rheumatoid arthritis with rheumatoid factor of multiple sites without organ or systems involvement: Secondary | ICD-10-CM

## 2023-12-15 MED ORDER — METHYLPREDNISOLONE ACETATE 40 MG/ML IJ SUSP
40.0000 mg | Freq: Once | INTRAMUSCULAR | Status: AC
  Administered 2023-12-15: 40 mg via INTRA_ARTICULAR

## 2023-12-15 NOTE — Assessment & Plan Note (Signed)
 Plan: - Should continue to follow-up with rheumatologist and pain management as scheduled.   - Will continue her regimen of Lyrica , Cymbalta , tramadol , temazepam , muscle relaxants as prescribed - At this time I do not feel that RA is contributing largely to her shoulder symptoms.

## 2023-12-15 NOTE — Patient Instructions (Signed)

## 2023-12-15 NOTE — Progress Notes (Signed)
 DATE OF VISIT: 12/15/2023    MARIAM HELBERT DOB: [birth year] MRN: [id number]  CC:  f/u Rt shoulder pain  History of present Illness: [name] is a 68 y.o. 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, Fibromyalgia  - following with outside providers including Pain Management - current pain regimen of cymbalta , tramadol , flexeril  prn, temazepam  at bedtime, and lyrica  -- prescribed by PCP  Previously seen by Dr Alanna Hu at [facility] office 06/07/23 for her right shoulder pain - underwent xrays showing mild to moderate AC joint and GH OA - had subacromial injection  Today patient reports shoulder pain has returned Injection helped greatly until recently No new injury/trauma Has changed her medication regimen since last seeing Dr Liz Rigger - now taking Cimzia  400mg  injection monthly - previously on Symphony and Acetimra -- needed to stop these due to elevated LFTs Pain in the right shoulder along the lateral and posterior aspect of the shoulder Worse at night when trying to sleep Worse with overhead activity Denies any weakness Current pain regimen has not been overly helpful Denies any radiation of symptoms  Going to beach tomorrow - [place]  Medications:  Outpatient Encounter Medications as of 12/15/2023  Medication Sig   acetaminophen  (TYLENOL ) 650 MG CR tablet Take by mouth.   albuterol  (VENTOLIN  HFA) 108 (90 Base) MCG/ACT inhaler Inhale 2 puffs into the lungs every 6 (six) hours as needed for wheezing or shortness of breath.   Cholecalciferol 125 MCG (5000 UT) TABS take 1 tablet by oral route every day   conjugated estrogens  (PREMARIN ) vaginal cream Place 0.5 Applicatorfuls (1 gram) vaginally 3 (three) times a week.   Cyanocobalamin  3000 MCG/ML LIQD as directed Sublingual   cyclobenzaprine  (FLEXERIL ) 10 MG tablet Take 1 tablet (10 mg total) by mouth every 12 (twelve) hours  as needed.   cyclobenzaprine  (FLEXERIL ) 10 MG tablet Take 1 tablet (10 mg total) by mouth at bedtime.   diclofenac  Sodium (VOLTAREN ) 1 % GEL Apply 4 g topically 4 (four) times daily.   DULoxetine  (CYMBALTA ) 30 MG capsule Take 1 capsule (30 mg total) by mouth 2 (two) times daily.   Emollient (DERMEND BRUISE FORMULA) CREA as directed Externally   estradiol  (DOTTI ) 0.1 MG/24HR patch Place 1 patch (0.1 mg total) onto the skin 2 (two) times a week.   estradiol  (DOTTI ) 0.1 MG/24HR patch Place 1 patch (0.1 mg total) onto the skin 2 (two) times a week.   ferric derisomaltose  (MONOFERRIC ) 1000 MG/10ML SOLN injection    furosemide  (LASIX ) 20 MG tablet Take 1 tablet (20 mg total) by mouth every morning.   glucose blood test strip Test blood sugar once daily. Dx Code: E11.9   hydrALAZINE  (APRESOLINE ) 25 MG tablet Take 1 tablet (25 mg total) by mouth 2 (two) times daily. Hold if feeling dizzy or SBP<110 mmHg).  Can use additional 25 mg as needed for SBP>150 mmHg   HYDROcodone  bit-homatropine (HYCODAN) 5-1.5 MG/5ML syrup Take 5 mLs by mouth every 6 (six) hours as needed for cough.   hydroxychloroquine  (PLAQUENIL ) 200 MG tablet Take 2 tablets (400 mg total) by mouth once daily.   hydroxychloroquine  (PLAQUENIL ) 200 MG tablet Take 2 tablets (400 mg total) by mouth daily.   Hyoscyamine  Sulfate SL (LEVSIN/SL) 0.125 MG SUBL Place 1 tablet under the tongue every 6 (six) hours as needed.   methocarbamol  (ROBAXIN ) 500 MG tablet Take 1  tablet (500 mg total) by mouth 4 (four) times daily as needed.   methocarbamol  (ROBAXIN ) 750 MG tablet Take 1 tablet (750 mg total) by mouth every 8 (eight) hours.   Multiple Vitamins-Minerals (CENTRUM ADULTS) TABS take 2 tablets by oral route  every day with food   mupirocin  ointment (BACTROBAN ) 2 % Apply to toes as needed   nystatin  (MYCOSTATIN /NYSTOP ) powder Apply 1 Application topically 3 (three) times daily.   ofloxacin  (FLOXIN ) 0.3 % OTIC solution Place 10 drops into both ears  daily.   olmesartan  (BENICAR ) 20 MG tablet Take 1 tablet (20 mg total) by mouth daily with supper.   ondansetron  (ZOFRAN ) 4 MG tablet Take 1 tablet (4 mg total) by mouth every 8 (eight) hours as needed for nausea or vomiting.   pantoprazole  (PROTONIX ) 40 MG tablet Take 1 tablet (40 mg total) by mouth daily.   potassium chloride  (KLOR-CON  M) 10 MEQ tablet Take 1 tablet (10 mEq total) by mouth daily.   pregabalin  (LYRICA ) 150 MG capsule Take one capsule (150 mg dose) by mouth 3 (three) times a day. Max Daily Amount: 450 mg   pregabalin  (LYRICA ) 150 MG capsule Take one capsule (150 mg dose) by mouth 3 (three) times a day. Max Daily Amount: 450 mg   pregabalin  (LYRICA ) 150 MG capsule Take 1 capsule (150 mg total) by mouth 3 (three) times daily.   Rimegepant Sulfate  (NURTEC) 75 MG TBDP Take 1 tablet (75 mg total) by mouth every other day.   rosuvastatin  (CRESTOR ) 20 MG tablet Take 1 tablet (20 mg total) by mouth daily.   temazepam  (RESTORIL ) 30 MG capsule Take 1 capsule (30 mg total) by mouth at bedtime as needed for sleep.   thiamine (VITAMIN B-1) 100 MG tablet Take 100 mg daily by mouth.   tirzepatide  (MOUNJARO ) 15 MG/0.5ML Pen Inject 15 mg into the skin once a week.   traMADol  (ULTRAM ) 50 MG tablet Take 1 tablet (50 mg total) by mouth every 8 (eight) hours as needed.   traMADol  (ULTRAM ) 50 MG tablet Take 1 tablet (50 mg total) by mouth every 8 (eight) hours as needed. Max daily amount of 150mg  (3 tabs)   triamcinolone  ointment (KENALOG ) 0.1 % Apply 1 Application (a thin layer) topically in the morning and at bedtime.   triamcinolone  ointment (KENALOG ) 0.1 % APPLY A THIN LAYER TO THE AFFECTED AREA(S) BY TOPICAL ROUTE 2 TIMES PER DAY   UBRELVY  100 MG TABS TAKE ONE TABLET BY MOUTH DAILY AS NEEDED FOR MIGRAINE. MAX 2 TABLETS IN 24 HOURS   [EXPIRED] methylPREDNISolone  acetate (DEPO-MEDROL ) injection 40 mg    No facility-administered encounter medications on file as of 12/15/2023.    Allergies: is  allergic to gabapentin , isoniazid , morphine , nitrofurantoin, tamiflu  [oseltamivir  phosphate], hctz [hydrochlorothiazide], macrolides and ketolides, promethazine , wound dressing adhesive, fentanyl , and sulfamethoxazole-trimethoprim.  Physical Examination: Vitals: BP 136/74   Ht 5' 9.5" (1.765 m)   Wt 177 lb (80.3 kg)   BMI 25.76 kg/m  GENERAL:  SHAWNNA PANCAKE is a 68 y.o. female appearing their stated age, alert and oriented x 3, in no apparent distress.  SKIN: no rashes or lesions, skin clean, dry, intact MSK: Shoulder: Right shoulder without gross deformity.  Full range of motion with positive painful arc.  Tender to palpation along the bicipital groove, greater tuberosity, mild tenderness palpation over the [facility] joint.  Positive Hawkins, positive Neer, positive empty can, negative drop arm.  Rotator cuff strength 5 -/5 throughout. Left shoulder with full range of motion  without pain, weakness, instability Neurovascularly intact distally  Radiology: XRAY: RT SHOULDER XR AP IR/AP ER/AXILLARY/SCAPULAR Y 06/07/23 personally reviewed and interpreted by me today showing: - moderate AC joint OA - mild to moderate GH OA - limited view of hardware in the lower cervical spine from prior ACDF without any abnormalities Assessment & Plan Pain in joint of right shoulder Acute on chronic right shoulder pain with underlying osteoarthritis, also history of RA, fibromyalgia, chronic pain syndrome.  Current symptoms seem most consistent to musculoskeletal issue from her underlying osteoarthritis and likely rotator cuff tendinopathy  Plan: - Previous notes with visit in November reviewed - Right shoulder x-rays personally reviewed as noted above - She did quite well with previous subacromial injection.  Discussed risks and benefits of repeat injection.  Will proceed with this today.  This was completed as noted below.  If does not get good response from this injection could consider ultrasound-guided  glenohumeral injection given her underlying glenohumeral osteoarthritis - She will continue her current regimen prescribed by PCP and pain management with Lyrica , Cymbalta , tramadol , temazepam , muscle relaxants - Follow-up 4 to 6 weeks if worsening or no improvement, sooner as needed - Patient was also informed there will be a new provider starting at the [facility] office in August.  PROCEDURE:  Risks & benefits of RT shoulder subacromial injection reviewed. Consent obtained. Time-out completed. Patient prepped and draped in the normal fashion. Area cleansed with alcohol. Ethyl chloride spray used to anesthetize the skin. Solution of 4 mL 1% lidocaine  with 1 mL methylprednisolone  (Depo-medrol ) 40mg /mL injected into the RT subacromial space using a 25-gauge 1.5-inch needle via the posterior approach. Patient tolerated procedure well without any complications. Area covered with adhesive bandage. Post-procedure care reviewed. All questions answered.  Primary osteoarthritis, right shoulder Acute on chronic right shoulder pain with underlying glenohumeral and AC joint osteoarthritis.  Previously responded quite well with subacromial injection in November 2024, had approximately 6 to 7 months relief  Plan: -  Previous notes with visit in November reviewed - Right shoulder x-rays personally reviewed as noted above - She did quite well with previous subacromial injection.  Discussed risks and benefits of repeat injection.  Will proceed with this today.  This was completed as noted below.  If does not get good response from this injection could consider ultrasound-guided glenohumeral injection given her underlying glenohumeral osteoarthritis - She will continue her current regimen prescribed by PCP and pain management with Lyrica , Cymbalta , tramadol , temazepam , muscle relaxants - Follow-up 4 to 6 weeks if worsening or no improvement, sooner as needed - Patient was also informed there will be a new provider  starting at the [facility] office in August. Rheumatoid arthritis involving multiple sites with positive rheumatoid factor (HCC) Plan: - Should continue to follow-up with rheumatologist and pain management as scheduled.   - Will continue her regimen of Lyrica , Cymbalta , tramadol , temazepam , muscle relaxants as prescribed - At this time I do not feel that RA is contributing largely to her shoulder symptoms. Fibromyalgia Right shoulder pain could be complicated by underlying fibromyalgia and chronic pain syndrome, but appears to be more isolated shoulder issue at this time  Plan: - Should continue to follow-up with rheumatologist and pain management as scheduled. - Will continue her regimen of Lyrica , Cymbalta , tramadol , temazepam , muscle relaxants as prescribed Chronic pain syndrome Plan: - Should continue to follow-up with rheumatologist and pain management as scheduled.   - Will continue her regimen of Lyrica , Cymbalta , tramadol , temazepam , muscle relaxants  as prescribed   Patient expressed understanding & agreement with above.  Encounter Diagnoses  Name Primary?   Pain in joint of right shoulder Yes   Primary osteoarthritis, right shoulder    Rheumatoid arthritis involving multiple sites with positive rheumatoid factor (HCC)    Fibromyalgia    Chronic pain syndrome     No orders of the defined types were placed in this encounter.

## 2023-12-27 ENCOUNTER — Ambulatory Visit: Payer: Commercial Managed Care - PPO | Admitting: Podiatry

## 2023-12-27 ENCOUNTER — Other Ambulatory Visit: Payer: Self-pay

## 2023-12-27 ENCOUNTER — Other Ambulatory Visit: Payer: Self-pay | Admitting: Podiatry

## 2023-12-27 ENCOUNTER — Telehealth: Payer: Self-pay

## 2023-12-27 ENCOUNTER — Encounter: Payer: Self-pay | Admitting: Podiatry

## 2023-12-27 VITALS — Ht 69.5 in | Wt 177.0 lb

## 2023-12-27 DIAGNOSIS — M79675 Pain in left toe(s): Secondary | ICD-10-CM | POA: Diagnosis not present

## 2023-12-27 DIAGNOSIS — B351 Tinea unguium: Secondary | ICD-10-CM

## 2023-12-27 DIAGNOSIS — M79674 Pain in right toe(s): Secondary | ICD-10-CM | POA: Diagnosis not present

## 2023-12-27 DIAGNOSIS — E1142 Type 2 diabetes mellitus with diabetic polyneuropathy: Secondary | ICD-10-CM

## 2023-12-27 NOTE — Telephone Encounter (Signed)
 Pharmacy Patient Advocate Encounter   Received notification from CoverMyMeds that prior authorization for Ubrelvy  100MG  tablets is required/requested.   Insurance verification completed.   The patient is insured through [facility] .   Per test claim: PA required; PA submitted to above mentioned insurance via CoverMyMeds Key/confirmation #/EOC [id number] Status is pending

## 2023-12-28 ENCOUNTER — Other Ambulatory Visit (HOSPITAL_BASED_OUTPATIENT_CLINIC_OR_DEPARTMENT_OTHER): Payer: Self-pay

## 2023-12-28 MED ORDER — MUPIROCIN 2 % EX OINT
TOPICAL_OINTMENT | CUTANEOUS | 1 refills | Status: AC
  Filled 2023-12-28: qty 22, 20d supply, fill #0
  Filled 2024-01-25: qty 22, 20d supply, fill #1

## 2023-12-28 NOTE — Telephone Encounter (Signed)
Approved refill of Mupirocin Ointment.

## 2023-12-30 ENCOUNTER — Other Ambulatory Visit (HOSPITAL_COMMUNITY): Payer: Self-pay

## 2023-12-30 NOTE — Telephone Encounter (Signed)
 Pharmacy Patient Advocate Encounter  Received notification from [facility] that Prior Authorization for Ubrelvy  100MG  tablets has been APPROVED from 12-27-2023 to 12-25-2024   PA #/Case ID/Reference #: [id number]

## 2024-01-01 ENCOUNTER — Encounter: Payer: Self-pay | Admitting: Podiatry

## 2024-01-01 NOTE — Progress Notes (Signed)
 Subjective:  Patient ID: [name], female    DOB: [birth year],  MRN: [id number]  [name] presents to clinic today for preventative diabetic foot care and painful thick toenails that are difficult to trim. Pain interferes with ambulation. Aggravating factors include wearing enclosed shoe gear. Pain is relieved with periodic professional debridement. I need a refill of antibiotic ointment. Chief Complaint  Patient presents with   Nail Problem    Pt is here for [facility] last A1C was 5.5 PCP is Dr Antonio and LOV was in April.   New problem(s): None.   PCP is Antonio Meth, Rockwell R, DO.  Allergies  Allergen Reactions   Gabapentin  Swelling    Pt states she has eye swelling    Isoniazid  Shortness Of Breath   Morphine  Hives and Nausea And Vomiting    Allergic to PCA pump only    Nitrofurantoin Other (See Comments), Rash, Shortness Of Breath and Dermatitis    REACTION: welts   Tamiflu  [Oseltamivir  Phosphate] Nausea And Vomiting    I vomited within 30 minutes of taking it and was told I cannot ever take it again.   Hctz [Hydrochlorothiazide] Rash   Macrolides And Ketolides Rash   Promethazine  Other (See Comments)    Other Reaction(s): Psychosis  IF GIVEN  IV-hallucinations     CAN TAKE PO PHENERGAN , , Other reaction(s): Hallucinations, IF GIVEN  IV-hallucinations     CAN TAKE PO PHENERGAN , Other reaction(s): Unknown   Wound Dressing Adhesive Rash    Adhesive Tape    Fentanyl  Hives, Itching and Rash    REACTION: welts   Sulfamethoxazole-Trimethoprim Rash    Review of Systems: Negative except as noted in the HPI.  Objective: No changes noted in today's physical examination. There were no vitals filed for this visit. [name] is a pleasant 68 y.o. female WD, WN in NAD. AAO x 3.  Vascular Examination: Capillary refill time immediate b/l. Palpable pedal pulses. Pedal hair sparse b/l. No pain with calf compression b/l. Skin temperature gradient WNL b/l. No cyanosis  or clubbing b/l. No ischemia or gangrene noted b/l.   Neurological Examination: Pt has subjective symptoms of neuropathy.  Dermatological Examination: Pedal skin with normal turgor, texture and tone b/l.  No open wounds. No interdigital macerations.   Toenails 1-5 b/l thick, discolored,  with subungual debris and pain on dorsal palpation.   No corns, calluses nor porokeratotic lesions noted.  Musculoskeletal Examination: Muscle strength 5/5 to all lower extremity muscle groups bilaterally. HAV with bunion deformity noted b/l LE. Clawtoe deformity 1-5 bilaterally.. No pain, crepitus or joint limitation noted with ROM b/l LE.  Patient ambulates independently without assistive aids.  Radiographs: None  Last A1c:      Latest Ref Rng & Units 10/21/2023    9:24 AM 04/22/2023    9:11 AM  Hemoglobin A1C  Hemoglobin-A1c 4.6 - 6.5 % 5.5  5.5     Assessment/Plan: 1. Pain due to onychomycosis of toenails of both feet   2. Diabetic peripheral neuropathy associated with type 2 diabetes mellitus [facility])   Consent given for treatment. Patient examined. All patient's and/or POA's questions/concerns addressed on today's visit. Refill of Mupirocin  will be approved. Mycotic toenails 1-5 debrided in length and girth without incident. Continue foot and shoe inspections daily. Monitor blood glucose per PCP/Endocrinologist's recommendations.Continue soft, supportive shoe gear daily. Report any pedal injuries to medical professional. Call office if there are any quesitons/concerns. Return in about 3 months (around 03/28/2024).  [name]  [name], DPM      [place] LOCATION: [address], [place] [address]                   Office [phone]   [facility] LOCATION: [address] [place], [place] [address] Office [phone]

## 2024-01-06 ENCOUNTER — Other Ambulatory Visit (HOSPITAL_BASED_OUTPATIENT_CLINIC_OR_DEPARTMENT_OTHER): Payer: Self-pay

## 2024-01-06 ENCOUNTER — Other Ambulatory Visit: Payer: Self-pay

## 2024-01-09 ENCOUNTER — Encounter: Payer: Self-pay | Admitting: Family Medicine

## 2024-01-09 ENCOUNTER — Ambulatory Visit: Admitting: Family Medicine

## 2024-01-09 VITALS — BP 136/64 | HR 74 | Temp 98.8°F | Resp 18 | Ht 69.5 in | Wt 175.0 lb

## 2024-01-09 DIAGNOSIS — M7989 Other specified soft tissue disorders: Secondary | ICD-10-CM

## 2024-01-09 NOTE — Patient Instructions (Signed)
 Will refer you to a general surgeon for evaluation

## 2024-01-09 NOTE — Progress Notes (Signed)
 Established Patient Office Visit  Subjective   Patient ID: [name], female    DOB: 16-Jun-1956  Age: 68 y.o. MRN: [id number]  Chief Complaint  Patient presents with   Mass    Pt states finding a bump in the right butt cheek, pt states having pain with sitting     HPI Discussed the use of AI scribe software for clinical note transcription with the patient, who gave verbal consent to proceed.  History of Present Illness [name] is a 68 year old female who presents with a painful lump on her right buttock.  She noticed the lump this morning. It is located internally on the right buttock, palpable between her fingers, and approximately the size of a nickel. The lump causes discomfort, especially when sitting, as it is situated where her hip bone touches the seat.  The lump is not associated with any visible skin changes and is not located in the vagina or rectum. She is uncertain if it is a cyst or another type of mass, but it is painful when sitting down.  She has not attempted any treatments such as warm compresses or soaking in warm water , as she only discovered the lump this morning. She mentions difficulty getting in and out of the bathtub but is open to trying warm compresses on the area.  Socially, she is involved in caring for her daughter's dogs, which requires frequent travel to her daughter's house to manage the dogs' separation and feeding schedule.   Patient Active Problem List   Diagnosis Date Noted   Panniculitis 03/15/2023   Unilateral primary osteoarthritis, right knee 01/24/2023   Family history of abdominal aortic aneurysm (AAA) repair 07/23/2022   Synovitis of toe 04/19/2022   Preventative health care 10/22/2021   Colon cancer screening 08/05/2021   Diabetic ulcer of toe of right foot associated with type 2 diabetes mellitus, limited to breakdown of skin (HCC) 06/19/2021   Tinea corporis 06/19/2021   Dysuria 06/19/2021   IDA (iron deficiency  anemia) 05/18/2021   Pre-operative clearance 04/21/2021   Controlled type 2 diabetes mellitus with hyperglycemia, without long-term current use of insulin  (HCC) 04/21/2021   Spinal stenosis of lumbar region 04/21/2021   Bronchitis due to COVID-19 virus 03/27/2021   Orthostatic hypotension 01/09/2021   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   Spondylosis of cervical spine 10/07/2020   Lumbar radiculopathy 10/07/2020   Anemia 09/11/2020   Intractable migraine 08/26/2020   Inflammatory arthritis 07/22/2020   Thrombocytopenia (HCC) 07/22/2020   Chronic nonintractable headache 07/21/2020   Syncope 07/21/2020   Dizziness 07/21/2020   Cellulitis of right hand 07/21/2020   Lumbar spondylolysis 05/13/2020   Primary hypertension 04/02/2020   Body mass index (BMI) 29.0-29.9, adult 04/02/2020   Menopausal symptom 03/11/2020   Stress 03/11/2020   Unspecified dyspareunia (CODE) 03/11/2020   Bilateral sacroiliitis (HCC) 03/04/2020   Senile calcific aortic valve sclerosis 02/08/2020   DOE (dyspnea on exertion) 02/08/2020   Murmur 01/07/2020   Acquired trigger finger of right ring finger 10/25/2019   Pain in right knee 08/24/2019   Rheumatoid arthritis involving multiple sites with positive rheumatoid factor (HCC) 08/02/2019   Primary osteoarthritis of both knees 08/02/2019   PTSD (post-traumatic stress disorder) 02/08/2019   Dog bite of arm, right, initial encounter 10/05/2018   Hyperlipidemia associated with type 2 diabetes mellitus (HCC) 08/24/2018   Symptomatic abdominal panniculus 12/12/2017   Carpal tunnel syndrome of left wrist 11/28/2017   Pain in right hand  11/08/2017   Cervical radiculopathy 10/06/2017   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   Tibial fracture 10/14/2016   Keratosis 12/04/2014   Onychocryptosis 08/21/2014   Onychomycosis 05/21/2014   Fissured skin 01/22/2014   Fissure in skin of foot 12/18/2013   Porokeratosis 12/18/2013   Changes in skin texture 12/18/2013    Pain in lower limb 11/21/2013   Ingrown nail 07/20/2013   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 07/02/2013   Obesity (BMI 30-39.9) 06/25/2013   GERD (gastroesophageal reflux disease) 08/20/2011   Lapband APL + HH repair June 2011-Major revision Dec 2013 06/29/2011   Abdominal pain 08/01/2010   VITAMIN B12 DEFICIENCY 04/01/2010   BARIATRIC SURGERY STATUS 01/29/2010   ONYCHOMYCOSIS, BILATERAL 06/30/2009   STRESS INCONTINENCE 06/10/2009   ACUTE PHARYNGITIS 04/29/2009   COUGH 04/27/2009   BRONCHITIS, ACUTE 04/16/2009   NAUSEA 04/08/2009   DIARRHEA 04/08/2009   MORBID OBESITY 03/03/2009   SKIN RASH 11/18/2008   UNSPECIFIED VITAMIN D  DEFICIENCY 07/15/2008   OTHER SPECIFIED ANEMIAS 07/15/2008   Pain in joint, multiple sites 06/13/2008   Myalgia and myositis, unspecified 06/13/2008   Myalgia 06/13/2008   BACK PAIN, LUMBAR 02/14/2008   ACUTE SINUSITIS, UNSPECIFIED 09/01/2007   CELLULITIS 08/12/2007   Cellulitis 08/12/2007   Depression with anxiety 06/09/2007   DEPRESSION 06/09/2007   Controlled type 2 diabetes mellitus with diabetic neuropathy, without long-term current use of insulin  (HCC) 01/04/2007   Labile hypertension 01/04/2007   ALLERGIC RHINITIS 01/04/2007   HEADACHE 01/04/2007   Anxiety 12/02/1998   Past Medical History:  Diagnosis Date   Allergic rhinitis    Anxiety    Asthma    hx bronchial asthma at times with upper resp infection   Depression    Fibromyalgia    GERD (gastroesophageal reflux disease)    Headache(784.0)    migraine and cluster headaches   Hyperlipemia    Hypertension    IBS (irritable bowel syndrome)    Menopause    Neuromuscular disorder (HCC) 2009   hx of fibromyalgia, polyarthralsia (surgery induced)   Osteoarthritis    Rheumatoid arthritis (HCC)    Complicated by osteoarthritis as well.   Rheumatoid arthritis (HCC)    Stress incontinence    at times   Tibial fracture 10/14/2016   evulsion periostial right   Type  2 diabetes mellitus with complication, without long-term current use of insulin  (HCC)    diet controlled no meds -> however was diagnosed with a diabetic foot ulcer.   Past Surgical History:  Procedure Laterality Date   ABDOMINAL HYSTERECTOMY  12/1999   complete   CARPAL TUNNEL RELEASE Right 12/27/2017   Procedure: CARPAL TUNNEL RELEASE;  Surgeon: Camella Fallow, MD;  Location: [facility];  Service: Orthopedics;  Laterality: Right;   CERVICAL CONIZATION W/BX  june 1990   dysplasia of cervix/used 5Fu cream for 3 months   CERVICAL LAMINECTOMY  2005 & 2009   X 2   NO ROM PROBLEMS   CESAREAN SECTION  1989 & 1993   X 2   CHOLECYSTECTOMY  1986   DILATION AND CURETTAGE OF UTERUS   [phone]   missed abortion   ESOPHAGOGASTRODUODENOSCOPY  06/29/2011   Procedure: ESOPHAGOGASTRODUODENOSCOPY (EGD);  Surgeon: Alm VEAR Angle, MD;  Location: [facility];  Service: General;  Laterality: N/A;   FOOT SURGERY  2005   RT HEEL   GASTRIC BANDING PORT REVISION  09/24/2011   Procedure: GASTRIC BANDING PORT REVISION;  Surgeon: Donnice KATHEE Lunger, MD;  Location: [facility];  Service:  General;  Laterality: N/A;   GASTRIC ROUX-EN-Y N/A 06/06/2017   Procedure: 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;  Surgeon: Gladis Cough, MD;  Location: [facility];  Service: General;  Laterality: N/A;   HYSTEROSCOPY  1999   LAPAROSCOPIC GASTRIC BANDING  12/30/09   LAPAROSCOPIC GASTRIC SLEEVE RESECTION N/A 07/02/2013   Procedure: LAPAROSCOPIC GASTRIC SLEEVE RESECTION upper endoscopy;  Surgeon: Cough KATHEE Gladis, MD;  Location: [facility];  Service: General;  Laterality: N/A;   LAPAROSCOPIC REPAIR AND REMOVAL OF GASTRIC BAND N/A 07/02/2013   Procedure: LAPAROSCOPIC REMOVAL OF GASTRIC BAND;  Surgeon: Cough KATHEE Gladis, MD;  Location: [facility];  Service: General;  Laterality: N/A;   LAPAROSCOPIC REVISION OF GASTRIC BAND  07/03/2012   Procedure: LAPAROSCOPIC REVISION OF GASTRIC BAND;  Surgeon: Cough KATHEE Gladis, MD;  Location: [facility];  Service: General;  Laterality: N/A;  removal of old lap. band port, replaced with AP standard band   LAPAROSCOPY  09/24/2011   Procedure: LAPAROSCOPY DIAGNOSTIC;  Surgeon: Cough KATHEE Gladis, MD;  Location: [facility];  Service: General;  Laterality: N/A;   LAPAROSCOPY  07/03/2012   Procedure: LAPAROSCOPY DIAGNOSTIC;  Surgeon: Cough KATHEE Gladis, MD;  Location: [facility];  Service: General;  Laterality: N/A;  Exploratory Laparoscopy    MESH APPLIED TO LAP PORT  07/03/2012   Procedure: MESH APPLIED TO LAP PORT;  Surgeon: Cough KATHEE Gladis, MD;  Location: [facility];  Service: General;  Laterality: N/A;   NASAL SINUS SURGERY  1995 & 1997   X 2   right knee  1981   ARTHROSCOPY AND ARTHROTOMY   right knee arthroscopy and arthrotomy  12-1979   TONSILLECTOMY  1971   TUBAL LIGATION  1993   WITH C -SECTION   Social History   Tobacco Use   Smoking status: Never   Smokeless tobacco: Never  Vaping Use   Vaping status: Never Used  Substance Use Topics   Alcohol use: Not Currently   Drug use: Never   Social History   Socioeconomic History   Marital status: Married    Spouse name: Elycia Woodside   Number of children: 2   Years of education: Not on file   Highest education level: Bachelor's degree (e.g., BA, AB, BS)  Occupational History   Occupation: Teacher, adult education:  [employer]  Tobacco Use   Smoking status: Never   Smokeless tobacco: Never  Vaping Use   Vaping status: Never Used  Substance and Sexual Activity   Alcohol use: Not Currently   Drug use: Never   Sexual activity: Yes    Birth control/protection: None  Other Topics Concern   Not on file  Social History Narrative   Right handed   Two story home   Drinks caffeine occasionally   Social Drivers of Health   Financial Resource Strain: Low Risk  (11/29/2023)   Received from [facility]   Overall Financial Resource Strain (CARDIA)    Difficulty of Paying Living Expenses: Not hard at all   Food Insecurity: No Food Insecurity (11/29/2023)   [facility]   Hunger Vital Sign    Within the past 12 months, you worried that your food would run out before you got the money to buy more.: Never true    Within the past 12 months, the food you bought just didn't last and you didn't have money to get more.: Never true  Transportation Needs: No Transportation Needs (11/29/2023)  Received from [facility]   PRAPARE - Transportation    Lack of Transportation (Medical): No    Lack of Transportation (Non-Medical): No  Physical Activity: Insufficiently Active (09/07/2023)   Received from [facility]   Exercise Vital Sign    On average, how many days per week do you engage in moderate to strenuous exercise (like a brisk walk)?: 2 days    On average, how many minutes do you engage in exercise at this level?: 20 min  Stress: No Stress Concern Present (04/22/2023)   [employer] - Occupational Stress Questionnaire    Feeling of Stress : Not at all  Social Connections: Socially Integrated (09/07/2023)   Received from [facility]   Social Network    How would you rate your social network (family, work, friends)?: Good participation with social networks  Intimate Partner Violence: Not At Risk (09/07/2023)   Received from [facility]   HITS    Over the last 12 months how often did your partner physically hurt you?: Never    Over the last 12 months how often did your partner insult you or talk down to you?: Never    Over the last 12 months how often did your partner threaten you with physical harm?: Never    Over the last 12 months how often did your partner scream or curse at you?: Never   Family Status  Relation Name Status   Mother  Deceased   Father  Deceased at age 52   Other  (Not Specified)   Other  (Not Specified)  No partnership data on file   Family History  Problem Relation Age of Onset   Diabetes Mother    Stroke Mother     Breast cancer Mother    Atrial fibrillation Mother    Hypertension Mother    Cancer Mother 30       breast   Diabetes Father    Stroke Father 71       2nd 6 month apart   Hypertension Father    Heart attack Father 67       stents   Dementia Father    AAA (abdominal aortic aneurysm) Father 58       repair   CAD Father 39   Thyroid  disease Other    Heart disease Other    COPD Other    Allergies  Allergen Reactions   Gabapentin  Swelling    Pt states she has eye swelling    Isoniazid  Shortness Of Breath   Morphine  Hives and Nausea And Vomiting    Allergic to PCA pump only    Nitrofurantoin Other (See Comments), Rash, Shortness Of Breath and Dermatitis    REACTION: welts   Tamiflu  [Oseltamivir  Phosphate] Nausea And Vomiting    I vomited within 30 minutes of taking it and was told I cannot ever take it again.   Hctz [Hydrochlorothiazide] Rash   Macrolides And Ketolides Rash   Promethazine  Other (See Comments)    Other Reaction(s): Psychosis  IF GIVEN  IV-hallucinations     CAN TAKE PO PHENERGAN , , Other reaction(s): Hallucinations, IF GIVEN  IV-hallucinations     CAN TAKE PO PHENERGAN , Other reaction(s): Unknown   Wound Dressing Adhesive Rash    Adhesive Tape    Fentanyl  Hives, Itching and Rash    REACTION: welts   Sulfamethoxazole-Trimethoprim Rash      ROS    Objective:     BP 136/64 (BP Location: Left Arm, Patient  Position: Sitting)   Pulse 74   Temp 98.8 F (37.1 C) (Oral)   Resp 18   Ht 5' 9.5 (1.765 m)   Wt 175 lb (79.4 kg)   SpO2 98%   BMI 25.47 kg/m  BP Readings from Last 3 Encounters:  01/09/24 136/64  12/15/23 136/74  10/21/23 118/62   Wt Readings from Last 3 Encounters:  01/09/24 175 lb (79.4 kg)  12/27/23 177 lb (80.3 kg)  12/15/23 177 lb (80.3 kg)   SpO2 Readings from Last 3 Encounters:  01/09/24 98%  10/21/23 98%  10/04/23 98%      Physical Exam Vitals and nursing note reviewed.  Constitutional:      General: She is not in  acute distress.    Appearance: Normal appearance. She is well-developed.  HENT:     Head: Normocephalic and atraumatic.   Eyes:     General: No scleral icterus.       Right eye: No discharge.        Left eye: No discharge.    Cardiovascular:     Rate and Rhythm: Normal rate and regular rhythm.     Heart sounds: No murmur heard. Pulmonary:     Effort: Pulmonary effort is normal. No respiratory distress.     Breath sounds: Normal breath sounds.  Genitourinary:     Comments: Nickel sized -soft tissue nodule , tender to touch  Musculoskeletal:        General: Normal range of motion.     Cervical back: Normal range of motion and neck supple.     Right lower leg: No edema.     Left lower leg: No edema.   Skin:    General: Skin is warm and dry.   Neurological:     Mental Status: She is alert and oriented to person, place, and time.   Psychiatric:        Mood and Affect: Mood normal.        Behavior: Behavior normal.        Thought Content: Thought content normal.        Judgment: Judgment normal.      No results found for any visits on 01/09/24.  Last CBC Lab Results  Component Value Date   WBC 6.6 09/12/2023   HGB 13.1 09/12/2023   HCT 40.9 09/12/2023   MCV 92.5 09/12/2023   MCH 29.6 09/12/2023   RDW 12.7 09/12/2023   PLT 173 09/12/2023   Last metabolic panel Lab Results  Component Value Date   GLUCOSE 89 09/12/2023   NA 142 09/12/2023   K 4.1 09/12/2023   CL 101 09/12/2023   CO2 35 (H) 09/12/2023   BUN 13 09/12/2023   CREATININE 0.88 09/12/2023   EGFR 79.0 10/19/2023   CALCIUM  9.6 09/12/2023   PROT 7.1 09/12/2023   ALBUMIN 4.1 09/12/2023   BILITOT 0.6 09/12/2023   ALKPHOS 65 09/12/2023   AST 35 09/12/2023   ALT 28 09/12/2023   ANIONGAP 6 09/12/2023   Last lipids Lab Results  Component Value Date   CHOL 105 10/21/2023   HDL 60.70 10/21/2023   LDLCALC 33 10/21/2023   TRIG 59.0 10/21/2023   CHOLHDL 2 10/21/2023   Last hemoglobin A1c Lab  Results  Component Value Date   HGBA1C 5.5 10/21/2023   Last thyroid  functions Lab Results  Component Value Date   TSH 1.66 10/21/2023   Last vitamin D  Lab Results  Component Value Date   VD25OH 42.15 04/22/2023   Last  vitamin B12 and Folate Lab Results  Component Value Date   VITAMINB12 234 04/22/2023   FOLATE 18.1 05/11/2021      The ASCVD Risk score (Arnett DK, et al., 2019) failed to calculate for the following reasons:   The valid total cholesterol range is 130 to 320 mg/dL    Assessment & Plan:   Problem List Items Addressed This Visit   None Visit Diagnoses       Nodule of soft tissue    -  Primary   Relevant Orders   Ambulatory referral to General Surgery     Assessment and Plan Assessment & Plan Subcutaneous mass on buttocks   She presents with a subcutaneous mass on the buttocks, approximately the size of a nickel, palpable and causing discomfort, especially when sitting. Differential diagnosis includes a sebaceous cyst or another type of cyst. The mass is not located in the vagina or rectum, with no external skin involvement. Acute onset noted this morning. Discussed potential aspiration to determine contents if necessary. Warm compresses suggested for symptomatic relief. Refer to a general surgeon for evaluation and possible aspiration or removal of the mass. Advise warm compresses to the area to alleviate discomfort while awaiting surgical consultation.    Return if symptoms worsen or fail to improve.    Mililani Murthy R Lowne Chase, DO

## 2024-01-11 DIAGNOSIS — M0609 Rheumatoid arthritis without rheumatoid factor, multiple sites: Secondary | ICD-10-CM | POA: Diagnosis not present

## 2024-01-11 LAB — COMPREHENSIVE METABOLIC PANEL WITH GFR: EGFR: 80

## 2024-01-12 ENCOUNTER — Other Ambulatory Visit (HOSPITAL_BASED_OUTPATIENT_CLINIC_OR_DEPARTMENT_OTHER): Payer: Self-pay

## 2024-01-12 MED ORDER — HYDROXYCHLOROQUINE SULFATE 200 MG PO TABS
400.0000 mg | ORAL_TABLET | Freq: Every day | ORAL | 0 refills | Status: AC
  Filled 2024-01-12 – 2024-02-03 (×2): qty 180, 90d supply, fill #0

## 2024-01-19 DIAGNOSIS — L723 Sebaceous cyst: Secondary | ICD-10-CM | POA: Diagnosis not present

## 2024-01-19 DIAGNOSIS — Z9884 Bariatric surgery status: Secondary | ICD-10-CM | POA: Diagnosis not present

## 2024-01-20 ENCOUNTER — Other Ambulatory Visit: Payer: Self-pay

## 2024-01-20 ENCOUNTER — Other Ambulatory Visit (HOSPITAL_BASED_OUTPATIENT_CLINIC_OR_DEPARTMENT_OTHER): Payer: Self-pay

## 2024-01-24 ENCOUNTER — Other Ambulatory Visit (HOSPITAL_BASED_OUTPATIENT_CLINIC_OR_DEPARTMENT_OTHER): Payer: Self-pay

## 2024-01-24 ENCOUNTER — Other Ambulatory Visit: Payer: Self-pay | Admitting: Family Medicine

## 2024-01-25 ENCOUNTER — Other Ambulatory Visit (HOSPITAL_BASED_OUTPATIENT_CLINIC_OR_DEPARTMENT_OTHER): Payer: Self-pay

## 2024-01-25 ENCOUNTER — Encounter: Payer: Self-pay | Admitting: Family Medicine

## 2024-01-25 ENCOUNTER — Encounter: Payer: Self-pay | Admitting: Family

## 2024-01-25 DIAGNOSIS — I10 Essential (primary) hypertension: Secondary | ICD-10-CM

## 2024-01-25 MED ORDER — DICLOFENAC SODIUM 1 % EX GEL
4.0000 g | Freq: Four times a day (QID) | CUTANEOUS | 6 refills | Status: DC
  Filled 2024-01-25: qty 400, 56d supply, fill #0

## 2024-01-25 MED ORDER — FUROSEMIDE 20 MG PO TABS
20.0000 mg | ORAL_TABLET | Freq: Every morning | ORAL | 1 refills | Status: AC
  Filled 2024-01-25 – 2024-03-13 (×2): qty 90, 90d supply, fill #0
  Filled 2024-06-11: qty 90, 90d supply, fill #1

## 2024-01-25 MED ORDER — POTASSIUM CHLORIDE CRYS ER 10 MEQ PO TBCR
10.0000 meq | EXTENDED_RELEASE_TABLET | Freq: Every day | ORAL | 1 refills | Status: AC
  Filled 2024-01-25 – 2024-03-13 (×2): qty 90, 90d supply, fill #0

## 2024-01-27 ENCOUNTER — Telehealth: Payer: Self-pay | Admitting: Pharmacy Technician

## 2024-01-27 ENCOUNTER — Telehealth: Payer: Self-pay | Admitting: Family Medicine

## 2024-01-27 ENCOUNTER — Other Ambulatory Visit (HOSPITAL_COMMUNITY): Payer: Self-pay

## 2024-01-27 ENCOUNTER — Other Ambulatory Visit (HOSPITAL_BASED_OUTPATIENT_CLINIC_OR_DEPARTMENT_OTHER): Payer: Self-pay

## 2024-01-27 NOTE — Telephone Encounter (Signed)
 Pharmacy Patient Advocate Encounter   Received notification from CoverMyMeds that prior authorization for UBRELVY  100MG  is required/requested.   Insurance verification completed.   The patient is insured through [facility] .   Per test claim: PA required; PA submitted to above mentioned insurance via CoverMyMeds Key/confirmation #/EOC [id number] Status is pending

## 2024-01-27 NOTE — Telephone Encounter (Signed)
 Pharmacy Patient Advocate Encounter  Received notification from [employer] that Prior Authorization for UBRELVY  100MG  has been APPROVED from 6.16.25 to 6.16.26

## 2024-01-27 NOTE — Telephone Encounter (Signed)
 Unsure who this number belongs to, unable to give information w/o written consent.

## 2024-01-27 NOTE — Telephone Encounter (Signed)
 Copied from CRM [id number]. Topic: General - Other >> Jan 27, 2024 12:07 PM Gennette ORN wrote: Reason for CRM: Tinnie [phone] is calling to confirm if patient is with us  and also needed fax number.

## 2024-01-30 ENCOUNTER — Other Ambulatory Visit (HOSPITAL_BASED_OUTPATIENT_CLINIC_OR_DEPARTMENT_OTHER): Payer: Self-pay

## 2024-01-30 ENCOUNTER — Other Ambulatory Visit: Payer: Self-pay | Admitting: Neurology

## 2024-01-31 ENCOUNTER — Other Ambulatory Visit (HOSPITAL_BASED_OUTPATIENT_CLINIC_OR_DEPARTMENT_OTHER): Payer: Self-pay

## 2024-02-03 ENCOUNTER — Other Ambulatory Visit (HOSPITAL_BASED_OUTPATIENT_CLINIC_OR_DEPARTMENT_OTHER): Payer: Self-pay

## 2024-02-09 ENCOUNTER — Telehealth: Payer: Self-pay | Admitting: Family Medicine

## 2024-02-09 DIAGNOSIS — M0609 Rheumatoid arthritis without rheumatoid factor, multiple sites: Secondary | ICD-10-CM | POA: Diagnosis not present

## 2024-02-09 DIAGNOSIS — M8580 Other specified disorders of bone density and structure, unspecified site: Secondary | ICD-10-CM | POA: Diagnosis not present

## 2024-02-09 DIAGNOSIS — G8929 Other chronic pain: Secondary | ICD-10-CM | POA: Diagnosis not present

## 2024-02-09 DIAGNOSIS — M79643 Pain in unspecified hand: Secondary | ICD-10-CM | POA: Diagnosis not present

## 2024-02-09 DIAGNOSIS — M1990 Unspecified osteoarthritis, unspecified site: Secondary | ICD-10-CM | POA: Diagnosis not present

## 2024-02-09 DIAGNOSIS — M2550 Pain in unspecified joint: Secondary | ICD-10-CM | POA: Diagnosis not present

## 2024-02-09 DIAGNOSIS — M797 Fibromyalgia: Secondary | ICD-10-CM | POA: Diagnosis not present

## 2024-02-09 DIAGNOSIS — M549 Dorsalgia, unspecified: Secondary | ICD-10-CM | POA: Diagnosis not present

## 2024-02-09 DIAGNOSIS — Z79899 Other long term (current) drug therapy: Secondary | ICD-10-CM | POA: Diagnosis not present

## 2024-02-09 NOTE — Telephone Encounter (Signed)
 Pt dropped off labwork for pcp to look at from Drayral's office. Placed in pcp's tray.

## 2024-02-13 ENCOUNTER — Other Ambulatory Visit (HOSPITAL_BASED_OUTPATIENT_CLINIC_OR_DEPARTMENT_OTHER): Payer: Self-pay

## 2024-02-16 ENCOUNTER — Other Ambulatory Visit (HOSPITAL_BASED_OUTPATIENT_CLINIC_OR_DEPARTMENT_OTHER): Payer: Self-pay

## 2024-02-20 ENCOUNTER — Other Ambulatory Visit (HOSPITAL_BASED_OUTPATIENT_CLINIC_OR_DEPARTMENT_OTHER): Payer: Self-pay

## 2024-02-20 ENCOUNTER — Telehealth: Payer: Self-pay | Admitting: Neurology

## 2024-02-20 ENCOUNTER — Other Ambulatory Visit: Payer: Self-pay

## 2024-02-20 NOTE — Telephone Encounter (Signed)
 Needs refill, Emgality  deleted per pharmacy and would need new Rx script sent

## 2024-02-20 NOTE — Telephone Encounter (Signed)
 Patient advised per last note, Migraine prevention:  Due to increase migraines, plan to change from Emgality  to Nurtec every other day Migraine rescue:  Ubrelvy  100mg 

## 2024-02-21 DIAGNOSIS — R229 Localized swelling, mass and lump, unspecified: Secondary | ICD-10-CM | POA: Diagnosis not present

## 2024-02-21 DIAGNOSIS — L570 Actinic keratosis: Secondary | ICD-10-CM | POA: Diagnosis not present

## 2024-02-22 ENCOUNTER — Other Ambulatory Visit (HOSPITAL_BASED_OUTPATIENT_CLINIC_OR_DEPARTMENT_OTHER): Payer: Self-pay

## 2024-02-22 ENCOUNTER — Ambulatory Visit: Payer: Self-pay | Admitting: General Surgery

## 2024-02-22 MED ORDER — PREGABALIN 150 MG PO CAPS
150.0000 mg | ORAL_CAPSULE | Freq: Three times a day (TID) | ORAL | 0 refills | Status: AC
  Filled 2024-02-22: qty 270, 90d supply, fill #0

## 2024-02-24 ENCOUNTER — Ambulatory Visit: Admitting: Family Medicine

## 2024-02-24 ENCOUNTER — Other Ambulatory Visit: Payer: Self-pay

## 2024-02-24 ENCOUNTER — Encounter: Payer: Self-pay | Admitting: Family Medicine

## 2024-02-24 VITALS — BP 138/70 | Ht 69.5 in | Wt 171.0 lb

## 2024-02-24 DIAGNOSIS — E1140 Type 2 diabetes mellitus with diabetic neuropathy, unspecified: Secondary | ICD-10-CM

## 2024-02-24 DIAGNOSIS — M0579 Rheumatoid arthritis with rheumatoid factor of multiple sites without organ or systems involvement: Secondary | ICD-10-CM

## 2024-02-24 DIAGNOSIS — M19011 Primary osteoarthritis, right shoulder: Secondary | ICD-10-CM

## 2024-02-24 MED ORDER — METHYLPREDNISOLONE ACETATE 40 MG/ML IJ SUSP
40.0000 mg | Freq: Once | INTRAMUSCULAR | Status: AC
  Administered 2024-02-24: 40 mg via INTRA_ARTICULAR

## 2024-02-24 NOTE — Progress Notes (Signed)
 DATE OF VISIT: 02/24/2024        [name] DOB: 14-May-1956 MRN: [id number]  CC: Right shoulder pain  History of present Illness: [name] is a 68 y.o. female who presents for a follow-up visit for right shoulder pain.  Per medical records, patient was evaluated on 12/15/2023 for right shoulder pain at which time she received a subacromial steroid injection.  Since the subacromial injection the patient reports initial resolution of her pain for 4 to 6 weeks.  She notes that her pain slowly began to recur around the 6-week mark post injection.  Denies new injury or trauma.  Pain with lifting and overhead activity.  Also with PMH significant for RA, fibromyalgia, chronic pain syndrome - has been off her RA meds for upcoming surgery to remove suspected lipoma from her buttock area Hx of DM-2 - last A1c= 5.5 on 10/21/23  Medications:  Outpatient Encounter Medications as of 02/24/2024  Medication Sig   acetaminophen  (TYLENOL ) 650 MG CR tablet Take by mouth.   Cholecalciferol 125 MCG (5000 UT) TABS take 1 tablet by oral route every day   conjugated estrogens  (PREMARIN ) vaginal cream Place 0.5 Applicatorfuls (1 gram) vaginally 3 (three) times a week.   Cyanocobalamin  3000 MCG/ML LIQD as directed Sublingual   cyclobenzaprine  (FLEXERIL ) 10 MG tablet Take 1 tablet (10 mg total) by mouth at bedtime.   diclofenac  Sodium (VOLTAREN ) 1 % GEL Apply 4 g topically 4 (four) times daily.   DULoxetine  (CYMBALTA ) 30 MG capsule Take 1 capsule (30 mg total) by mouth 2 (two) times daily.   Emollient (DERMEND BRUISE FORMULA) CREA as directed Externally   estradiol  (DOTTI ) 0.1 MG/24HR patch Place 1 patch (0.1 mg total) onto the skin 2 (two) times a week.   estradiol  (DOTTI ) 0.1 MG/24HR patch Place 1 patch (0.1 mg total) onto the skin 2 (two) times a week.   ferric derisomaltose  (MONOFERRIC ) 1000 MG/10ML SOLN injection    furosemide  (LASIX ) 20 MG tablet Take 1 tablet (20 mg total) by mouth every morning.    glucose blood test strip Test blood sugar once daily. Dx Code: E11.9   hydrALAZINE  (APRESOLINE ) 25 MG tablet Take 1 tablet (25 mg total) by mouth 2 (two) times daily. Hold if feeling dizzy or SBP<110 mmHg).  Can use additional 25 mg as needed for SBP>150 mmHg   HYDROcodone  bit-homatropine (HYCODAN) 5-1.5 MG/5ML syrup Take 5 mLs by mouth every 6 (six) hours as needed for cough.   hydroxychloroquine  (PLAQUENIL ) 200 MG tablet Take 2 tablets (400 mg total) by mouth once daily.   hydroxychloroquine  (PLAQUENIL ) 200 MG tablet Take 2 tablets (400 mg total) by mouth daily.   hydroxychloroquine  (PLAQUENIL ) 200 MG tablet Take 2 tablets (400 mg total) by mouth daily.   Hyoscyamine  Sulfate SL (LEVSIN /SL) 0.125 MG SUBL Place 1 tablet under the tongue every 6 (six) hours as needed.   methocarbamol  (ROBAXIN ) 500 MG tablet Take 1 tablet (500 mg total) by mouth 4 (four) times daily as needed.   methocarbamol  (ROBAXIN ) 750 MG tablet Take 1 tablet (750 mg total) by mouth every 8 (eight) hours.   Multiple Vitamins-Minerals (CENTRUM ADULTS) TABS take 2 tablets by oral route  every day with food   mupirocin  ointment (BACTROBAN ) 2 % Apply to toes as needed.   ofloxacin  (FLOXIN ) 0.3 % OTIC solution Place 10 drops into both ears daily.   olmesartan  (BENICAR ) 20 MG tablet Take 1 tablet (20 mg total) by mouth daily with supper.   ondansetron  (ZOFRAN )  4 MG tablet Take 1 tablet (4 mg total) by mouth every 8 (eight) hours as needed for nausea or vomiting.   pantoprazole  (PROTONIX ) 40 MG tablet Take 1 tablet (40 mg total) by mouth daily.   potassium chloride  (KLOR-CON  M) 10 MEQ tablet Take 1 tablet (10 mEq total) by mouth daily.   pregabalin  (LYRICA ) 150 MG capsule Take one capsule (150 mg dose) by mouth 3 (three) times a day. Max Daily Amount: 450 mg   pregabalin  (LYRICA ) 150 MG capsule Take one capsule (150 mg dose) by mouth 3 (three) times a day. Max Daily Amount: 450 mg   pregabalin  (LYRICA ) 150 MG capsule Take 1 capsule  (150 mg total) by mouth 3 (three) times daily.   pregabalin  (LYRICA ) 150 MG capsule Take 1 capsule (150 mg total) by mouth 3 (three) times daily. Max daily amount: 3 capsules (450mg )   Rimegepant Sulfate  (NURTEC) 75 MG TBDP Take 1 tablet (75 mg total) by mouth every other day.   rosuvastatin  (CRESTOR ) 20 MG tablet Take 1 tablet (20 mg total) by mouth daily.   temazepam  (RESTORIL ) 30 MG capsule Take 1 capsule (30 mg total) by mouth at bedtime as needed for sleep.   thiamine (VITAMIN B-1) 100 MG tablet Take 100 mg daily by mouth.   tirzepatide  (MOUNJARO ) 15 MG/0.5ML Pen Inject 15 mg into the skin once a week.   traMADol  (ULTRAM ) 50 MG tablet Take 1 tablet (50 mg total) by mouth every 8 (eight) hours as needed.   traMADol  (ULTRAM ) 50 MG tablet Take 1 tablet (50 mg total) by mouth every 8 (eight) hours as needed. Max daily amount of 150mg  (3 tabs)   triamcinolone  ointment (KENALOG ) 0.1 % Apply 1 Application (a thin layer) topically in the morning and at bedtime.   triamcinolone  ointment (KENALOG ) 0.1 % APPLY A THIN LAYER TO THE AFFECTED AREA(S) BY TOPICAL ROUTE 2 TIMES PER DAY   UBRELVY  100 MG TABS TAKE ONE TABLET BY MOUTH DAILY AS NEEDED FOR MIGRAINE. MAX 2 TABLETS IN 24 HOURS   [EXPIRED] methylPREDNISolone  acetate (DEPO-MEDROL ) injection 40 mg    No facility-administered encounter medications on file as of 02/24/2024.    Allergies: is allergic to gabapentin , isoniazid , morphine , nitrofurantoin, tamiflu  [oseltamivir  phosphate], hctz [hydrochlorothiazide], macrolides and ketolides, promethazine , wound dressing adhesive, fentanyl , and sulfamethoxazole-trimethoprim.  Physical Examination: Vitals: BP 138/70   Ht 5' 9.5 (1.765 m)   Wt 171 lb (77.6 kg)   BMI 24.89 kg/m  GENERAL:  [name] is a 68 y.o. female appearing their stated age, alert and oriented x 3, in no apparent distress.  SKIN: no rashes or lesions, skin clean, dry, intact MSK:  Right shoulder: Tenderness to palpation over the  posterior acromion and along the glenohumeral joint posteriorly.  Near full ROM with (+)painful arch.  Positive Hawkins test.  Negative Neer's, speeds, empty can, and O'Brien's tests. RTC strength 5-/5 throughout Left shoulder: No focal tenderness to palpation, intact range of motion other than extension and internal rotation. NEURO: sensation intact to light touch VASC: Well-perfused, no edema  Radiology: XRAY: (06/07/2023) Mild to moderate glenohumeral and acromioclavicular osteoarthritis  Assessment & Plan Primary osteoarthritis, right shoulder Patient presenting for follow-up for right shoulder pain.  Patient with 4 to 6 weeks of relief following subacromial injection, but with increasing pain.  Given patient's improvement following corticosteroid injection was brief, would consider subacromial injection treatment failure.   - ongoing pain may be related to OA vs RTC pathology  PLAN: - Discussed treatment  options with patient, who is electing to proceed with glenohumeral injection. Ultrasound guided GH injection completed today as noted below. - f/u 4-6 weeks if no improvement.  At that time would consider additional imaging vs referral to surgeon - cont current regiment prescribed by PCP and pain management  PROCEDURE:  Risks & benefits of right shoulder u/s guided GH joint injection reviewed. Consent obtained. Time-out completed. Patient prepped and draped in the normal fashion. Musculoskeletal ultrasound used to identify appropriate anatomy.  Right GH joint well identified posteriorly, no signs of effusion. After identifying appropriate anatomy, patient positioned & area cleansed with chlorhexadine. Ethyl chloride spray used to anesthetize the skin. Solution of 3 mL 1% lidocaine  injected under u/s guidance for local anesthesia using 25-gauge 1.5 inch needle. After ensuring adequate anesthesia a solution of 7 mL 1% lidocaine  with 1 mL methylprednisolone  (Depo-medrol ) 40mg /mL injected into the  right glenohumeral joint using a 22-gauge 3.5-inch needle via posterior approach under ultrasound guidance. Needle well-visualized in the joint. Images saved. Patient tolerated procedure well without any complications.  Area covered with adhesive bandage. Post-procedure care reviewed, all questions answered.  Rheumatoid arthritis involving multiple sites with positive rheumatoid factor (HCC) Acute on chronic Rt shoulder pain with underlying OA.  Has been off RA medications for upcoming surgery  PLAN: - cont f/u with Rheumatology and pain management as scheduled - cont current regimen with as prescribed with Lyrica , Cymbalta , Tramadol , Temazepam , muscle relaxants Controlled type 2 diabetes mellitus with diabetic neuropathy, without long-term current use of insulin  (HCC) Well-controlled DM-2  PLAN: - most recent A1c= 5.5 and well controlled.  Ok to proceed with GH injection with cortisone - cont current diabetes regimen   Patient expressed understanding & agreement with above.  Encounter Diagnoses  Name Primary?   Primary osteoarthritis, right shoulder Yes   Rheumatoid arthritis involving multiple sites with positive rheumatoid factor (HCC)    Controlled type 2 diabetes mellitus with diabetic neuropathy, without long-term current use of insulin  (HCC)     Orders Placed This Encounter  Procedures   US  LIMITED JOINT SPACE STRUCTURES UP RIGHT

## 2024-02-26 NOTE — Patient Instructions (Signed)

## 2024-02-26 NOTE — Assessment & Plan Note (Signed)
 Well-controlled DM-2  PLAN: - most recent A1c= 5.5 and well controlled.  Ok to proceed with GH injection with cortisone - cont current diabetes regimen

## 2024-02-26 NOTE — Assessment & Plan Note (Signed)
 Acute on chronic Rt shoulder pain with underlying OA.  Has been off RA medications for upcoming surgery  PLAN: - cont f/u with Rheumatology and pain management as scheduled - cont current regimen with as prescribed with Lyrica , Cymbalta , Tramadol , Temazepam , muscle relaxants

## 2024-03-01 ENCOUNTER — Other Ambulatory Visit (HOSPITAL_BASED_OUTPATIENT_CLINIC_OR_DEPARTMENT_OTHER): Payer: Self-pay

## 2024-03-02 ENCOUNTER — Other Ambulatory Visit (HOSPITAL_BASED_OUTPATIENT_CLINIC_OR_DEPARTMENT_OTHER): Payer: Self-pay

## 2024-03-02 ENCOUNTER — Ambulatory Visit
Admission: RE | Admit: 2024-03-02 | Discharge: 2024-03-02 | Disposition: A | Discharge status: Home / Self Care | Source: Ambulatory Visit | Attending: Family Medicine | Admitting: Family Medicine

## 2024-03-02 ENCOUNTER — Other Ambulatory Visit: Payer: Self-pay

## 2024-03-02 VITALS — BP 112/70 | HR 74 | Temp 97.9°F | Resp 17

## 2024-03-02 DIAGNOSIS — L089 Local infection of the skin and subcutaneous tissue, unspecified: Secondary | ICD-10-CM | POA: Diagnosis not present

## 2024-03-02 DIAGNOSIS — S80812A Abrasion, left lower leg, initial encounter: Secondary | ICD-10-CM | POA: Diagnosis not present

## 2024-03-02 MED ORDER — DOXYCYCLINE HYCLATE 100 MG PO CAPS
100.0000 mg | ORAL_CAPSULE | Freq: Two times a day (BID) | ORAL | 0 refills | Status: AC
  Filled 2024-03-02: qty 14, 7d supply, fill #0

## 2024-03-02 NOTE — ED Provider Notes (Signed)
 [facility]    CSN: [id number] Arrival date & time: 03/02/24  1425      History   Chief Complaint Chief Complaint  Patient presents with   Abrasion    I have 2 abrasions on my left calf that are red and a little warm to touch. I need antibiotics as I have Diabetes and Rheumatoid Arthritis. I cannot get in to see my doctor today and I will be leaving to go out of town tomorrow. - Entered by patient    HPI [name] is a 68 y.o. female presents for wound infection.  Patient reports last week she hit her left lower leg on a storage box causing 2 small abrasions.  She has been keeping them clean and dry and using peroxide and bacitracin but states they are beginning to feel red and warm.  Denies any fevers, drainage, swelling.  No history of MRSA.  She does have history of diabetes as well as rheumatoid arthritis and is on immunosuppressive medications.  She states she has had small wounds like this before that typically get infected.  No other concerns at this time.  HPI  Past Medical History:  Diagnosis Date   Allergic rhinitis    Anxiety    Asthma    hx bronchial asthma at times with upper resp infection   Depression    Fibromyalgia    GERD (gastroesophageal reflux disease)    Headache(784.0)    migraine and cluster headaches   Hyperlipemia    Hypertension    IBS (irritable bowel syndrome)    Menopause    Neuromuscular disorder (HCC) 2009   hx of fibromyalgia, polyarthralsia (surgery induced)   Osteoarthritis    Rheumatoid arthritis (HCC)    Complicated by osteoarthritis as well.   Rheumatoid arthritis (HCC)    Stress incontinence    at times   Tibial fracture 10/14/2016   evulsion periostial right   Type 2 diabetes mellitus with complication, without long-term current use of insulin  (HCC)    diet controlled no meds -> however was diagnosed with a diabetic foot ulcer.    Patient Active Problem List   Diagnosis Date Noted   Panniculitis 03/15/2023    Unilateral primary osteoarthritis, right knee 01/24/2023   Family history of abdominal aortic aneurysm (AAA) repair 07/23/2022   Synovitis of toe 04/19/2022   Preventative health care 10/22/2021   Colon cancer screening 08/05/2021   Diabetic ulcer of toe of right foot associated with type 2 diabetes mellitus, limited to breakdown of skin (HCC) 06/19/2021   Tinea corporis 06/19/2021   Dysuria 06/19/2021   IDA (iron deficiency anemia) 05/18/2021   Pre-operative clearance 04/21/2021   Controlled type 2 diabetes mellitus with hyperglycemia, without long-term current use of insulin  (HCC) 04/21/2021   Spinal stenosis of lumbar region 04/21/2021   Bronchitis due to COVID-19 virus 03/27/2021   Orthostatic hypotension 01/09/2021   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   Spondylosis of cervical spine 10/07/2020   Lumbar radiculopathy 10/07/2020   Anemia 09/11/2020   Intractable migraine 08/26/2020   Inflammatory arthritis 07/22/2020   Thrombocytopenia (HCC) 07/22/2020   Chronic nonintractable headache 07/21/2020   Syncope 07/21/2020   Dizziness 07/21/2020   Cellulitis of right hand 07/21/2020   Lumbar spondylolysis 05/13/2020   Primary hypertension 04/02/2020   Body mass index (BMI) 29.0-29.9, adult 04/02/2020   Menopausal symptom 03/11/2020   Stress 03/11/2020   Unspecified dyspareunia (CODE) 03/11/2020   Bilateral sacroiliitis (HCC) 03/04/2020   Senile  calcific aortic valve sclerosis 02/08/2020   DOE (dyspnea on exertion) 02/08/2020   Murmur 01/07/2020   Acquired trigger finger of right ring finger 10/25/2019   Pain in right knee 08/24/2019   Rheumatoid arthritis involving multiple sites with positive rheumatoid factor (HCC) 08/02/2019   Primary osteoarthritis of both knees 08/02/2019   PTSD (post-traumatic stress disorder) 02/08/2019   Dog bite of arm, right, initial encounter 10/05/2018   Hyperlipidemia associated with type 2 diabetes mellitus (HCC) 08/24/2018    Symptomatic abdominal panniculus 12/12/2017   Carpal tunnel syndrome of left wrist 11/28/2017   Pain in right hand 11/08/2017   Cervical radiculopathy 10/06/2017   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   Tibial fracture 10/14/2016   Keratosis 12/04/2014   Onychocryptosis 08/21/2014   Onychomycosis 05/21/2014   Fissured skin 01/22/2014   Fissure in skin of foot 12/18/2013   Porokeratosis 12/18/2013   Changes in skin texture 12/18/2013   Pain in lower limb 11/21/2013   Ingrown nail 07/20/2013   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 07/02/2013   Obesity (BMI 30-39.9) 06/25/2013   GERD (gastroesophageal reflux disease) 08/20/2011   Lapband APL + HH repair June 2011-Major revision Dec 2013 06/29/2011   Abdominal pain 08/01/2010   VITAMIN B12 DEFICIENCY 04/01/2010   BARIATRIC SURGERY STATUS 01/29/2010   ONYCHOMYCOSIS, BILATERAL 06/30/2009   STRESS INCONTINENCE 06/10/2009   ACUTE PHARYNGITIS 04/29/2009   COUGH 04/27/2009   BRONCHITIS, ACUTE 04/16/2009   NAUSEA 04/08/2009   DIARRHEA 04/08/2009   MORBID OBESITY 03/03/2009   SKIN RASH 11/18/2008   UNSPECIFIED VITAMIN D  DEFICIENCY 07/15/2008   OTHER SPECIFIED ANEMIAS 07/15/2008   Pain in joint, multiple sites 06/13/2008   Myalgia and myositis, unspecified 06/13/2008   Myalgia 06/13/2008   BACK PAIN, LUMBAR 02/14/2008   ACUTE SINUSITIS, UNSPECIFIED 09/01/2007   CELLULITIS 08/12/2007   Cellulitis 08/12/2007   Depression with anxiety 06/09/2007   DEPRESSION 06/09/2007   Controlled type 2 diabetes mellitus with diabetic neuropathy, without long-term current use of insulin  (HCC) 01/04/2007   Labile hypertension 01/04/2007   ALLERGIC RHINITIS 01/04/2007   HEADACHE 01/04/2007   Anxiety 12/02/1998    Past Surgical History:  Procedure Laterality Date   ABDOMINAL HYSTERECTOMY  12/1999   complete   CARPAL TUNNEL RELEASE Right 12/27/2017   Procedure: CARPAL TUNNEL RELEASE;  Surgeon: Camella Fallow, MD;  Location: [facility];   Service: Orthopedics;  Laterality: Right;   CERVICAL CONIZATION W/BX  june 1990   dysplasia of cervix/used 5Fu cream for 3 months   CERVICAL LAMINECTOMY  2005 & 2009   X 2   NO ROM PROBLEMS   CESAREAN SECTION  1989 & 1993   X 2   CHOLECYSTECTOMY  1986   DILATION AND CURETTAGE OF UTERUS   [phone]   missed abortion   ESOPHAGOGASTRODUODENOSCOPY  06/29/2011   Procedure: ESOPHAGOGASTRODUODENOSCOPY (EGD);  Surgeon: Alm VEAR Angle, MD;  Location: [facility];  Service: General;  Laterality: N/A;   FOOT SURGERY  2005   RT HEEL   GASTRIC BANDING PORT REVISION  09/24/2011   Procedure: GASTRIC BANDING PORT REVISION;  Surgeon: Donnice KATHEE Lunger, MD;  Location: [facility];  Service: General;  Laterality: N/A;   GASTRIC ROUX-EN-Y N/A 06/06/2017   Procedure: 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;  Surgeon: Lunger Donnice, MD;  Location: [facility];  Service: General;  Laterality: N/A;   HYSTEROSCOPY  1999   LAPAROSCOPIC GASTRIC BANDING  12/30/09   LAPAROSCOPIC GASTRIC SLEEVE RESECTION N/A 07/02/2013  Procedure: LAPAROSCOPIC GASTRIC SLEEVE RESECTION upper endoscopy;  Surgeon: Donnice KATHEE Lunger, MD;  Location: [facility];  Service: General;  Laterality: N/A;   LAPAROSCOPIC REPAIR AND REMOVAL OF GASTRIC BAND N/A 07/02/2013   Procedure: LAPAROSCOPIC REMOVAL OF GASTRIC BAND;  Surgeon: Donnice KATHEE Lunger, MD;  Location: [facility];  Service: General;  Laterality: N/A;   LAPAROSCOPIC REVISION OF GASTRIC BAND  07/03/2012   Procedure: LAPAROSCOPIC REVISION OF GASTRIC BAND;  Surgeon: Donnice KATHEE Lunger, MD;  Location: [facility];  Service: General;  Laterality: N/A;  removal of old lap. band port, replaced with AP standard band   LAPAROSCOPY  09/24/2011   Procedure: LAPAROSCOPY DIAGNOSTIC;  Surgeon: Donnice KATHEE Lunger, MD;  Location: [facility];  Service: General;  Laterality: N/A;   LAPAROSCOPY  07/03/2012   Procedure: LAPAROSCOPY DIAGNOSTIC;  Surgeon: Donnice KATHEE Lunger, MD;  Location: [facility];   Service: General;  Laterality: N/A;  Exploratory Laparoscopy    MESH APPLIED TO LAP PORT  07/03/2012   Procedure: MESH APPLIED TO LAP PORT;  Surgeon: Donnice KATHEE Lunger, MD;  Location: [facility];  Service: General;  Laterality: N/A;   NASAL SINUS SURGERY  1995 & 1997   X 2   right knee  1981   ARTHROSCOPY AND ARTHROTOMY   right knee arthroscopy and arthrotomy  12-1979   TONSILLECTOMY  1971   TUBAL LIGATION  1993   WITH C -SECTION    OB History     Gravida  3   Para  0   Term  0   Preterm  0   AB  1   Living  2      SAB  1   IAB  0   Ectopic  0   Multiple  0   Live Births               Home Medications    Prior to Admission medications   Medication Sig Start Date End Date Taking? Authorizing Provider  certolizumab pegol  (CIMZIA , 2 SYRINGE,) 200 MG/ML prefilled syringe Inject 400 mg into the skin every 30 (thirty) days.   Yes [provider]  doxycycline  (VIBRAMYCIN ) 100 MG capsule Take 1 capsule (100 mg total) by mouth 2 (two) times daily for 7 days. 03/02/24 03/09/24 Yes Mikiya Nebergall, Jodi R, NP  acetaminophen  (TYLENOL ) 650 MG CR tablet Take by mouth.    [provider]  Cholecalciferol 125 MCG (5000 UT) TABS take 1 tablet by oral route every day    [provider]  conjugated estrogens  (PREMARIN ) vaginal cream Place 0.5 Applicatorfuls (1 gram) vaginally 3 (three) times a week. 09/05/23     Cyanocobalamin  3000 MCG/ML LIQD as directed Sublingual    [provider]  cyclobenzaprine  (FLEXERIL ) 10 MG tablet Take 1 tablet (10 mg total) by mouth at bedtime. 11/29/23     diclofenac  Sodium (VOLTAREN ) 1 % GEL Apply 4 g topically 4 (four) times daily. 01/25/24   Antonio Cyndee Jamee JONELLE, DO  DULoxetine  (CYMBALTA ) 30 MG capsule Take 1 capsule (30 mg total) by mouth 2 (two) times daily. 10/21/23   Antonio Cyndee Jamee JONELLE, DO  Emollient (DERMEND BRUISE FORMULA) CREA as directed Externally 06/04/20   [provider]  estradiol  (DOTTI ) 0.1 MG/24HR  patch Place 1 patch (0.1 mg total) onto the skin 2 (two) times a week. 09/05/23     estradiol  (DOTTI ) 0.1 MG/24HR patch Place 1 patch (0.1 mg total) onto the skin 2 (two) times a week. 12/12/23  ferric derisomaltose  (MONOFERRIC ) 1000 MG/10ML SOLN injection     [provider]  furosemide  (LASIX ) 20 MG tablet Take 1 tablet (20 mg total) by mouth every morning. 01/25/24   Antonio Cyndee Rockers R, DO  glucose blood test strip Test blood sugar once daily. Dx Code: E11.9 01/09/19   Antonio Cyndee, Rockers SAUNDERS, DO  hydrALAZINE  (APRESOLINE ) 25 MG tablet Take 1 tablet (25 mg total) by mouth 2 (two) times daily. Hold if feeling dizzy or SBP<110 mmHg).  Can use additional 25 mg as needed for SBP>150 mmHg 07/27/23   Anner Alm ORN, MD  HYDROcodone  bit-homatropine [facility]) 5-1.5 MG/5ML syrup Take 5 mLs by mouth every 6 (six) hours as needed for cough. 10/04/23   Frann Mabel Mt, DO  hydroxychloroquine  (PLAQUENIL ) 200 MG tablet Take 2 tablets (400 mg total) by mouth once daily. 08/05/23     hydroxychloroquine  (PLAQUENIL ) 200 MG tablet Take 2 tablets (400 mg total) by mouth daily. 11/09/23     hydroxychloroquine  (PLAQUENIL ) 200 MG tablet Take 2 tablets (400 mg total) by mouth daily. 01/12/24     Hyoscyamine  Sulfate SL (LEVSIN /SL) 0.125 MG SUBL Place 1 tablet under the tongue every 6 (six) hours as needed. 06/28/22   Antonio Cyndee Rockers SAUNDERS, DO  methocarbamol  (ROBAXIN ) 500 MG tablet Take 1 tablet (500 mg total) by mouth 4 (four) times daily as needed. 11/29/23     methocarbamol  (ROBAXIN ) 750 MG tablet Take 1 tablet (750 mg total) by mouth every 8 (eight) hours. 08/23/23     Multiple Vitamins-Minerals (CENTRUM ADULTS) TABS take 2 tablets by oral route  every day with food    [provider]  mupirocin  ointment (BACTROBAN ) 2 % Apply to toes as needed. 12/28/23   Gaynel Delon CROME, DPM  ofloxacin  (FLOXIN ) 0.3 % OTIC solution Place 10 drops into both ears daily. 01/14/23   Antonio Cyndee Rockers SAUNDERS, DO  olmesartan   (BENICAR ) 20 MG tablet Take 1 tablet (20 mg total) by mouth daily with supper. 07/27/23   Anner Alm ORN, MD  ondansetron  (ZOFRAN ) 4 MG tablet Take 1 tablet (4 mg total) by mouth every 8 (eight) hours as needed for nausea or vomiting. 11/22/23   Antonio Cyndee, Rockers SAUNDERS, DO  pantoprazole  (PROTONIX ) 40 MG tablet Take 1 tablet (40 mg total) by mouth daily. 06/15/23   Antonio Cyndee Rockers SAUNDERS, DO  potassium chloride  (KLOR-CON  M) 10 MEQ tablet Take 1 tablet (10 mEq total) by mouth daily. 01/25/24   Antonio Cyndee Rockers SAUNDERS, DO  pregabalin  (LYRICA ) 150 MG capsule Take one capsule (150 mg dose) by mouth 3 (three) times a day. Max Daily Amount: 450 mg 06/01/23     pregabalin  (LYRICA ) 150 MG capsule Take one capsule (150 mg dose) by mouth 3 (three) times a day. Max Daily Amount: 450 mg 09/07/23     pregabalin  (LYRICA ) 150 MG capsule Take 1 capsule (150 mg total) by mouth 3 (three) times daily. 11/30/23     pregabalin  (LYRICA ) 150 MG capsule Take 1 capsule (150 mg total) by mouth 3 (three) times daily. Max daily amount: 3 capsules (450mg ) 02/22/24     Rimegepant Sulfate  (NURTEC) 75 MG TBDP Take 1 tablet (75 mg total) by mouth every other day. 09/13/23   Skeet Juliene SAUNDERS, DO  rosuvastatin  (CRESTOR ) 20 MG tablet Take 1 tablet (20 mg total) by mouth daily. 10/21/23   Antonio Cyndee Rockers SAUNDERS, DO  temazepam  (RESTORIL ) 30 MG capsule Take 1 capsule (30 mg total) by mouth at bedtime as  needed for sleep. 10/21/23   Lowne Chase, Yvonne R, DO  thiamine (VITAMIN B-1) 100 MG tablet Take 100 mg daily by mouth.    [provider]  tirzepatide  (MOUNJARO ) 15 MG/0.5ML Pen Inject 15 mg into the skin once a week. 10/21/23   Lowne Chase, Yvonne R, DO  traMADol  (ULTRAM ) 50 MG tablet Take 1 tablet (50 mg total) by mouth every 8 (eight) hours as needed. 08/23/23     traMADol  (ULTRAM ) 50 MG tablet Take 1 tablet (50 mg total) by mouth every 8 (eight) hours as needed. Max daily amount of 150mg  (3 tabs) 11/29/23     triamcinolone  ointment (KENALOG ) 0.1 %  Apply 1 Application (a thin layer) topically in the morning and at bedtime. 09/02/22     triamcinolone  ointment (KENALOG ) 0.1 % APPLY A THIN LAYER TO THE AFFECTED AREA(S) BY TOPICAL ROUTE 2 TIMES PER DAY 09/05/23     UBRELVY  100 MG TABS TAKE ONE TABLET BY MOUTH DAILY AS NEEDED FOR MIGRAINE. MAX 2 TABLETS IN 24 HOURS 10/07/23   Skeet Juliene SAUNDERS, DO    Family History Family History  Problem Relation Age of Onset   Diabetes Mother    Stroke Mother    Breast cancer Mother    Atrial fibrillation Mother    Hypertension Mother    Cancer Mother 29       breast   Diabetes Father    Stroke Father 19       2nd 6 month apart   Hypertension Father    Heart attack Father 82       stents   Dementia Father    AAA (abdominal aortic aneurysm) Father 74       repair   CAD Father 14   Thyroid  disease Other    Heart disease Other    COPD Other     Social History Social History   Tobacco Use   Smoking status: Never   Smokeless tobacco: Never  Vaping Use   Vaping status: Never Used  Substance Use Topics   Alcohol use: Not Currently   Drug use: Never     Allergies   Gabapentin , Isoniazid , Morphine , Nitrofurantoin, Tamiflu  [oseltamivir  phosphate], Hctz [hydrochlorothiazide], Macrolides and ketolides, Promethazine , Wound dressing adhesive, Fentanyl , and Sulfamethoxazole-trimethoprim   Review of Systems Review of Systems  Skin:  Positive for wound.     Physical Exam Triage Vital Signs ED Triage Vitals  Encounter Vitals Group     BP 03/02/24 1443 112/70     Girls Systolic BP Percentile --      Girls Diastolic BP Percentile --      Boys Systolic BP Percentile --      Boys Diastolic BP Percentile --      Pulse Rate 03/02/24 1443 74     Resp 03/02/24 1443 17     Temp 03/02/24 1443 97.9 F (36.6 C)     Temp Source 03/02/24 1443 Oral     SpO2 03/02/24 1443 98 %     Weight --      Height --      Head Circumference --      Peak Flow --      Pain Score 03/02/24 1436 2     Pain Loc --       Pain Education --      Exclude from Growth Chart --    No data found.  Updated Vital Signs BP 112/70   Pulse 74   Temp 97.9 F (36.6 C) (  Oral)   Resp 17   SpO2 98%   Visual Acuity Right Eye Distance:   Left Eye Distance:   Bilateral Distance:    Right Eye Near:   Left Eye Near:    Bilateral Near:     Physical Exam Vitals and nursing note reviewed.  Constitutional:      General: She is not in acute distress.    Appearance: Normal appearance. She is not ill-appearing.  HENT:     Head: Normocephalic and atraumatic.  Eyes:     Pupils: Pupils are equal, round, and reactive to light.  Cardiovascular:     Rate and Rhythm: Normal rate.  Pulmonary:     Effort: Pulmonary effort is normal.  Skin:    General: Skin is warm and dry.         Comments: 2 abrasions to the left lower leg with mild erythema with minimal warmth.  No active drainage.  Minimal swelling without induration or fluctuance.  See photo  Neurological:     General: No focal deficit present.     Mental Status: She is alert and oriented to person, place, and time.  Psychiatric:        Mood and Affect: Mood normal.        Behavior: Behavior normal.         UC Treatments / Results  Labs (all labs ordered are listed, but only abnormal results are displayed) Labs Reviewed - No data to display CMP [facility] only) Order: [id number]  Status: Final result     Next appt: 03/14/2024 at 09:45 AM in Oncology ([facility])     Dx: Iron deficiency anemia due to chronic...   Test Result Released: Yes (seen)   0 Result Notes     1 HM Topic          Component Ref Range & Units (hover) 5 mo ago (09/12/23) 10 mo ago (04/25/23) 10 mo ago (04/22/23) 1 yr ago (12/10/22) 1 yr ago (12/10/22) 1 yr ago (12/10/22) 1 yr ago (10/22/22)  Sodium 142 145 139 R   141 R 139 R  Potassium 4.1 4.6 4.6 R   4.1 R 4.7 R  Chloride 101 104 100 R   101 R 98 R  CO2 35 High  34 High  32 R   24 Abnormal  R 33 High  R  Glucose,  Bld 89 86 CM 75   88 R 81  Comment: Glucose reference range applies only to samples taken after fasting for at least 8 hours.  BUN 13 7 Low  10 R   9 R 19 R  Creatinine 0.88 0.82 0.72 R   0.9 R 0.94 R  Calcium  9.6 9.2 9.5 R  9.4 R  9.8 R  Total Protein 7.1 6.6 6.2 R    7.0 R  Albumin 4.1 3.9 4.0 R  4.4 R  4.5 R  AST 35 37 28 R 35 R   43 High  R  ALT 28 32 29 R 39 Abnormal  R   60 High  R  Alkaline Phosphatase 65 70 70 R 72 R   85 R  Total Bilirubin 0.6 0.6 R 0.9 R    0.7 R  GFR, Estimated >60 >60 CM       Comment: (NOTE) Calculated using the CKD-EPI Creatinine Equation (2021)  Anion gap 6 7 CM       Comment: Performed at [facility] at [facility], [address], [place], [place] [address]  Resulting Agency [facility] [place] [facility] Abstrct Entry Abstrct Entry Abstrct Entry [place] [facility]        Specimen Collected: 09/12/23 08:45 Last Resulted: 09/12/23 09:24   EKG   Radiology No results found.  Procedures Procedures (including critical care time)  Medications Ordered in UC Medications - No data to display  Initial Impression / Assessment and Plan / UC Course  I have reviewed the triage vital signs and the nursing notes.  Pertinent labs & imaging results that were available during my care of the patient were reviewed by me and considered in my medical decision making (see chart for details).     Reviewed exam and symptoms with patient.  She is well-appearing and in no acute distress.  Will start doxycycline .  Discussed wound care and signs and symptoms of worsening infection.  Advised PCP follow-up if symptoms do not improve.  ER precautions reviewed. Final Clinical Impressions(s) / UC Diagnoses   Final diagnoses:  Infected abrasion of skin of left lower leg     Discharge Instructions      Start doxycycline  twice daily for 7 days.  Keep areas clean and dry.  Follow-up with your PCP if your symptoms do not improve.   Please go to the ER if you develop any worsening symptoms.  Hope you feel better soon!    ED Prescriptions     Medication Sig Dispense Auth. Provider   doxycycline  (VIBRAMYCIN ) 100 MG capsule Take 1 capsule (100 mg total) by mouth 2 (two) times daily for 7 days. 14 capsule Jarika Robben, Jodi R, NP      PDMP not reviewed this encounter.   Amy Myla SAUNDERS, NP 03/02/24 [phone]

## 2024-03-02 NOTE — Discharge Instructions (Addendum)
 Start doxycycline  twice daily for 7 days.  Keep areas clean and dry.  Follow-up with your PCP if your symptoms do not improve.  Please go to the ER if you develop any worsening symptoms.  Hope you feel better soon!

## 2024-03-02 NOTE — ED Triage Notes (Signed)
 Pt hit her lower left leg with a storage box last week and she has been using peroxide and bacitracin for the wounds, but it's not helping them. Pt has 2 small wounds on left lower leg and they are erythematous.

## 2024-03-05 ENCOUNTER — Encounter: Payer: Self-pay | Admitting: Cardiology

## 2024-03-05 ENCOUNTER — Encounter: Payer: Self-pay | Admitting: Family Medicine

## 2024-03-05 ENCOUNTER — Encounter: Payer: Self-pay | Admitting: Neurology

## 2024-03-13 ENCOUNTER — Other Ambulatory Visit: Payer: Self-pay | Admitting: Family

## 2024-03-13 ENCOUNTER — Other Ambulatory Visit: Payer: Self-pay

## 2024-03-13 ENCOUNTER — Other Ambulatory Visit (HOSPITAL_BASED_OUTPATIENT_CLINIC_OR_DEPARTMENT_OTHER): Payer: Self-pay

## 2024-03-13 DIAGNOSIS — D500 Iron deficiency anemia secondary to blood loss (chronic): Secondary | ICD-10-CM

## 2024-03-13 MED ORDER — TRAMADOL HCL 50 MG PO TABS: 50.0000 mg | ORAL_TABLET | Freq: Three times a day (TID) | ORAL | 1 refills | Status: AC | PRN

## 2024-03-14 ENCOUNTER — Inpatient Hospital Stay: Admitting: Family

## 2024-03-14 ENCOUNTER — Inpatient Hospital Stay: Attending: Medical Oncology

## 2024-03-14 ENCOUNTER — Ambulatory Visit: Admitting: Medical Oncology

## 2024-03-14 ENCOUNTER — Encounter: Payer: Self-pay | Admitting: Family

## 2024-03-14 ENCOUNTER — Inpatient Hospital Stay

## 2024-03-14 VITALS — BP 117/58 | HR 85 | Temp 98.3°F | Resp 18 | Wt 168.8 lb

## 2024-03-14 DIAGNOSIS — K912 Postsurgical malabsorption, not elsewhere classified: Secondary | ICD-10-CM | POA: Diagnosis not present

## 2024-03-14 DIAGNOSIS — Z9884 Bariatric surgery status: Secondary | ICD-10-CM | POA: Insufficient documentation

## 2024-03-14 DIAGNOSIS — D500 Iron deficiency anemia secondary to blood loss (chronic): Secondary | ICD-10-CM

## 2024-03-14 DIAGNOSIS — D509 Iron deficiency anemia, unspecified: Secondary | ICD-10-CM | POA: Diagnosis not present

## 2024-03-14 DIAGNOSIS — D508 Other iron deficiency anemias: Secondary | ICD-10-CM | POA: Diagnosis not present

## 2024-03-14 LAB — CBC WITH DIFFERENTIAL (CANCER CENTER ONLY)
Abs Immature Granulocytes: 0.01 K/uL (ref 0.00–0.07)
Basophils Absolute: 0.1 K/uL (ref 0.0–0.1)
Basophils Relative: 1 %
Eosinophils Absolute: 0.1 K/uL (ref 0.0–0.5)
Eosinophils Relative: 2 %
HCT: 38.8 % (ref 36.0–46.0)
Hemoglobin: 12.6 g/dL (ref 12.0–15.0)
Immature Granulocytes: 0 %
Lymphocytes Relative: 38 %
Lymphs Abs: 2 K/uL (ref 0.7–4.0)
MCH: 29.2 pg (ref 26.0–34.0)
MCHC: 32.5 g/dL (ref 30.0–36.0)
MCV: 90 fL (ref 80.0–100.0)
Monocytes Absolute: 0.7 K/uL (ref 0.1–1.0)
Monocytes Relative: 14 %
Neutro Abs: 2.4 K/uL (ref 1.7–7.7)
Neutrophils Relative %: 45 %
Platelet Count: 163 K/uL (ref 150–400)
RBC: 4.31 MIL/uL (ref 3.87–5.11)
RDW: 12.5 % (ref 11.5–15.5)
WBC Count: 5.2 K/uL (ref 4.0–10.5)
nRBC: 0 % (ref 0.0–0.2)

## 2024-03-14 LAB — CMP (CANCER CENTER ONLY)
ALT: 76 U/L — ABNORMAL HIGH (ref 0–44)
AST: 64 U/L — ABNORMAL HIGH (ref 15–41)
Albumin: 4 g/dL (ref 3.5–5.0)
Alkaline Phosphatase: 83 U/L (ref 38–126)
Anion gap: 10 (ref 5–15)
BUN: 10 mg/dL (ref 8–23)
CO2: 28 mmol/L (ref 22–32)
Calcium: 9.4 mg/dL (ref 8.9–10.3)
Chloride: 103 mmol/L (ref 98–111)
Creatinine: 0.85 mg/dL (ref 0.44–1.00)
GFR, Estimated: >60 mL/min (ref >60)
Glucose, Bld: 90 mg/dL (ref 70–99)
Potassium: 4.3 mmol/L (ref 3.5–5.1)
Sodium: 140 mmol/L (ref 135–145)
Total Bilirubin: 0.8 mg/dL (ref 0.0–1.2)
Total Protein: 6.8 g/dL (ref 6.5–8.1)

## 2024-03-14 LAB — IRON AND IRON BINDING CAPACITY (CC-WL,HP ONLY)
Iron: 72 ug/dL (ref 28–170)
Saturation Ratios: 23 % (ref 10.4–31.8)
TIBC: 316 ug/dL (ref 250–450)
UIBC: 244 ug/dL

## 2024-03-14 LAB — RETICULOCYTES
Immature Retic Fract: 6.8 % (ref 2.3–15.9)
RBC.: 4.34 MIL/uL (ref 3.87–5.11)
Retic Count, Absolute: 52.1 K/uL (ref 19.0–186.0)
Retic Ct Pct: 1.2 % (ref 0.4–3.1)

## 2024-03-14 LAB — FERRITIN: Ferritin: 233 ng/mL (ref 11–307)

## 2024-03-14 NOTE — Progress Notes (Signed)
 Hematology and Oncology Follow Up Visit  [name] [id number] Jul 26, 1955 68 y.o. 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 She is symptomatic with fatigue at times.  She may be having surgery next week to remove a cyst from the right buttock. She has had to stop her treatment for RA prior to surgery and this has increased her chronic pain.  No fever, chills, n/v, cough, rash, dizziness, SOB, chest pain, palpitations, abdominal pain or changes in bowel or bladder habits.  No blood loss noted. No bruising or petechiae.  She continues to have chronic back pain.  No swelling, numbness or tingling in his extremities.  No falls or syncope reported.  Appetite and hydration are good. Weight is stable at 168 lbs.   ECOG Performance Status: 1 - Symptomatic but completely ambulatory  Medications:  Allergies as of 03/14/2024       Reactions   Gabapentin  Swelling   Pt states she has eye swelling    Isoniazid  Shortness Of Breath   Morphine  Hives, Nausea And Vomiting   Allergic to PCA pump only   Nitrofurantoin Other (See Comments), Rash, Shortness Of Breath, Dermatitis   REACTION: welts   Tamiflu  [oseltamivir  Phosphate] Nausea And Vomiting   I vomited within 30 minutes of taking it and was told I cannot ever take it again.   Hctz [hydrochlorothiazide] Rash   Macrolides And Ketolides Rash   Promethazine  Other (See Comments)   Other Reaction(s): Psychosis IF GIVEN  IV-hallucinations     CAN TAKE PO PHENERGAN , , Other reaction(s): Hallucinations, IF GIVEN  IV-hallucinations     CAN TAKE PO PHENERGAN , Other reaction(s): Unknown   Wound Dressing Adhesive Rash   Adhesive Tape    Fentanyl  Hives, Itching, Rash   REACTION: welts   Sulfamethoxazole-trimethoprim Rash        Medication List        Accurate as  of March 14, 2024 12:14 PM. If you have any questions, ask your nurse or doctor.          acetaminophen  650 MG CR tablet Commonly known as: TYLENOL  Take 650 mg by mouth as needed.   Caltrate Minis Plus Minerals 300-800 MG-UNIT Tabs Take 2 mg by mouth 2 (two) times daily.   Centrum Adults Tabs take 2 tablets by oral route  every day with food   Cholecalciferol 50 MCG (2000 UT) Tabs Take 2,000 Units by mouth daily.   Cimzia  (2 Syringe) 200 MG/ML prefilled syringe Generic drug: certolizumab pegol  Inject 400 mg into the skin every 30 (thirty) days.   Claritin 10 MG tablet Generic drug: loratadine Take 10 mg by mouth as needed (Before RA injection).   Cyanocobalamin  3000 MCG Subl Take 3,000 mcg by mouth daily.   cyclobenzaprine  10 MG tablet Commonly known as: FLEXERIL  Take 1 tablet (10 mg total) by mouth at bedtime. What changed:  when to take this reasons to take this   DerMend Bruise Formula Crea Apply 1 application  topically daily as needed (Wound care).   diclofenac  Sodium 1 % Gel Commonly known as: VOLTAREN  Apply 4 g topically 4 (four) times daily. What changed:  when to take this reasons to take this   DULoxetine  30 MG capsule Commonly known as: CYMBALTA  Take 1 capsule (30 mg total) by mouth 2 (  two) times daily.   estradiol  0.1 MG/24HR patch Commonly known as: Dotti  Place 1 patch (0.1 mg total) onto the skin 2 (two) times a week.   furosemide  20 MG tablet Commonly known as: LASIX  Take 1 tablet (20 mg total) by mouth every morning.   glucose blood test strip Test blood sugar once daily. Dx Code: E11.9   hydrALAZINE  25 MG tablet Commonly known as: APRESOLINE  Take 1 tablet (25 mg total) by mouth 2 (two) times daily. Hold if feeling dizzy or SBP<110 mmHg).  Can use additional 25 mg as needed for SBP>150 mmHg   hydroxychloroquine  200 MG tablet Commonly known as: PLAQUENIL  Take 2 tablets (400 mg total) by mouth daily. What changed:  how much to  take when to take this   methocarbamol  500 MG tablet Commonly known as: ROBAXIN  Take 1 tablet (500 mg total) by mouth 4 (four) times daily as needed. What changed: reasons to take this   Mounjaro  15 MG/0.5ML Pen Generic drug: tirzepatide  Inject 15 mg into the skin once a week.   mupirocin  ointment 2 % Commonly known as: BACTROBAN  Apply to toes as needed. What changed:  how much to take how to take this when to take this reasons to take this additional instructions   Nurtec 75 MG Tbdp Generic drug: Rimegepant Sulfate  Take 1 tablet (75 mg total) by mouth every other day.   olmesartan  20 MG tablet Commonly known as: BENICAR  Take 1 tablet (20 mg total) by mouth daily with supper.   ondansetron  4 MG tablet Commonly known as: Zofran  Take 1 tablet (4 mg total) by mouth every 8 (eight) hours as needed for nausea or vomiting.   pantoprazole  40 MG tablet Commonly known as: PROTONIX  Take 1 tablet (40 mg total) by mouth daily. What changed: when to take this   potassium chloride  10 MEQ tablet Commonly known as: KLOR-CON  M Take 1 tablet (10 mEq total) by mouth daily.   pregabalin  150 MG capsule Commonly known as: LYRICA  Take 1 capsule (150 mg total) by mouth 3 (three) times daily. Max daily amount: 3 capsules (450mg )   Premarin  vaginal cream Generic drug: conjugated estrogens  Place 0.5 Applicatorfuls (1 gram) vaginally 3 (three) times a week. What changed:  how much to take when to take this   rosuvastatin  20 MG tablet Commonly known as: CRESTOR  Take 1 tablet (20 mg total) by mouth daily. What changed: when to take this   temazepam  30 MG capsule Commonly known as: RESTORIL  Take 1 capsule (30 mg total) by mouth at bedtime as needed for sleep.   thiamine 100 MG tablet Commonly known as: Vitamin B-1 Take 100 mg daily by mouth.   traMADol  50 MG tablet Commonly known as: ULTRAM  Take 1 tablet (50 mg total) by mouth 3 (three) times daily as needed. Max Daily Amount:  150 mg   triamcinolone  ointment 0.1 % Commonly known as: KENALOG  APPLY A THIN LAYER TO THE AFFECTED AREA(S) BY TOPICAL ROUTE 2 TIMES PER DAY   Ubrelvy  100 MG Tabs Generic drug: Ubrogepant  TAKE ONE TABLET BY MOUTH DAILY AS NEEDED FOR MIGRAINE. MAX 2 TABLETS IN 24 HOURS        Allergies:  Allergies  Allergen Reactions   Gabapentin  Swelling    Pt states she has eye swelling    Isoniazid  Shortness Of Breath   Morphine  Hives and Nausea And Vomiting    Allergic to PCA pump only    Nitrofurantoin Other (See Comments), Rash, Shortness Of Breath and Dermatitis  REACTION: welts   Tamiflu  [Oseltamivir  Phosphate] Nausea And Vomiting    I vomited within 30 minutes of taking it and was told I cannot ever take it again.   Hctz [Hydrochlorothiazide] Rash   Macrolides And Ketolides Rash   Promethazine  Other (See Comments)    Other Reaction(s): Psychosis  IF GIVEN  IV-hallucinations     CAN TAKE PO PHENERGAN , , Other reaction(s): Hallucinations, IF GIVEN  IV-hallucinations     CAN TAKE PO PHENERGAN , Other reaction(s): Unknown   Wound Dressing Adhesive Rash    Adhesive Tape    Fentanyl  Hives, Itching and Rash    REACTION: welts   Sulfamethoxazole-Trimethoprim Rash    Past Medical History, Surgical history, Social history, and Family History were reviewed and updated.  Review of Systems: All other 10 point review of systems is negative.   Physical Exam:  weight is 168 lb 12.8 oz (76.6 kg). Her oral temperature is 98.3 F (36.8 C). Her blood pressure is 117/58 (abnormal) and her pulse is 85. Her respiration is 18 and oxygen saturation is 99%.   Wt Readings from Last 3 Encounters:  03/14/24 168 lb 12.8 oz (76.6 kg)  02/24/24 171 lb (77.6 kg)  01/09/24 175 lb (79.4 kg)    Ocular: Sclerae unicteric, pupils equal, round and reactive to light Ear-nose-throat: Oropharynx clear, dentition fair Lymphatic: No cervical or supraclavicular adenopathy Lungs no rales or rhonchi, good  excursion bilaterally Heart regular rate and rhythm, no murmur appreciated Abd soft, nontender, positive bowel sounds MSK no focal spinal tenderness, no joint edema Neuro: non-focal, well-oriented, appropriate affect Breasts: Deferred   Lab Results  Component Value Date   WBC 5.2 03/14/2024   HGB 12.6 03/14/2024   HCT 38.8 03/14/2024   MCV 90.0 03/14/2024   PLT 163 03/14/2024   Lab Results  Component Value Date   FERRITIN 109 09/12/2023   IRON 80 09/12/2023   TIBC 307 09/12/2023   UIBC 227 09/12/2023   IRONPCTSAT 26 09/12/2023   Lab Results  Component Value Date   RETICCTPCT 1.2 03/14/2024   RBC 4.34 03/14/2024   No results found for: KPAFRELGTCHN, LAMBDASER, KAPLAMBRATIO No results found for: IGGSERUM, IGA, IGMSERUM No results found for: STEPHANY CARLOTA BENSON MARKEL EARLA JOANNIE DOC VICK, SPEI   Chemistry      Component Value Date/Time   NA 140 03/14/2024 0954   NA 141 12/10/2022 0000   K 4.3 03/14/2024 0954   CL 103 03/14/2024 0954   CO2 28 03/14/2024 0954   BUN 10 03/14/2024 0954   BUN 9 12/10/2022 0000   CREATININE 0.85 03/14/2024 0954   GLU 88 12/10/2022 0000      Component Value Date/Time   CALCIUM  9.4 03/14/2024 0954   ALKPHOS 83 03/14/2024 0954   AST 64 (H) 03/14/2024 0954   ALT 76 (H) 03/14/2024 0954   BILITOT 0.8 03/14/2024 0954       Impression and Plan: Ms. Bias is a very pleasant 68 yo caucasian female with anemia secondary to malabsorption s/p gastric bypass in 2018.  Iron studies are pending.  LFT's are mildly elevated. I have routed these to her PCP in case they would like to follow-up.  Patient has no c/o abdominal pain or bloating. Follow-up in 1 year.    Lauraine Pepper, NP 9/3/202512:14 PM

## 2024-03-14 NOTE — Patient Instructions (Addendum)
 SURGICAL WAITING ROOM VISITATION  Patients having surgery or a procedure may have no more than 2 support people in the waiting area - these visitors may rotate.    Children under the age of 49 must have an adult with them who is not the patient.  Visitors with respiratory illnesses are discouraged from visiting and should remain at home.  If the patient needs to stay at the hospital during part of their recovery, the visitor guidelines for inpatient rooms apply. Pre-op nurse will coordinate an appropriate time for 1 support person to accompany patient in pre-op.  This support person may not rotate.    Please refer to the [facility] website for the visitor guidelines for Inpatients (after your surgery is over and you are in a regular room).       Your procedure is scheduled on: 03/21/24   Report to [facility] Main Entrance    Report to admitting at 6:15 AM   Call this number if you have problems the morning of surgery [phone]   Do not eat food :After Midnight.   After Midnight you may have the following liquids until 5:30 AM DAY OF SURGERY  Water  Non-Citrus Juices (without pulp, NO RED-Apple, White grape, White cranberry) Black Coffee (NO MILK/CREAM OR CREAMERS, sugar ok)  Clear Tea (NO MILK/CREAM OR CREAMERS, sugar ok) regular and decaf                             Plain Jell-O (NO RED)                                           Fruit ices (not with fruit pulp, NO RED)                                     Popsicles (NO RED)                                                               Sports drinks like Gatorade (NO RED)                Oral Hygiene is also important to reduce your risk of infection.                                    Remember - BRUSH YOUR TEETH THE MORNING OF SURGERY WITH YOUR REGULAR TOOTHPASTE  DENTURES WILL BE REMOVED PRIOR TO SURGERY PLEASE DO NOT APPLY Poly grip OR ADHESIVES!!!   Stop all vitamins and herbal supplements 7 days before  surgery.   Take these medicines the morning of surgery with A SIP OF WATER : tylenol  if needed, cymbaltaq, apresoline , Plaquenil , claritin, lyrica .              Do not take Lasix  or Benicar  the morning of surgery.  DO NOT TAKE ANY ORAL DIABETIC MEDICATIONS DAY OF YOUR SURGERY             You may not have any  metal on your body including hair pins, jewelry, and body piercing             Do not wear make-up, lotions, powders, perfumes/cologne, or deodorant  Do not wear nail polish including gel and S&S, artificial/acrylic nails, or any other type of covering on natural nails including finger and toenails. If you have artificial nails, gel coating, etc. that needs to be removed by a nail salon please have this removed prior to surgery or surgery may need to be canceled/ delayed if the surgeon/ anesthesia feels like they are unable to be safely monitored.   Do not shave  48 hours prior to surgery.    Do not bring valuables to the hospital. [facility] IS NOT             RESPONSIBLE   FOR VALUABLES.   Contacts, glasses, dentures or bridgework may not be worn into surgery.  DO NOT BRING YOUR HOME MEDICATIONS TO THE HOSPITAL. PHARMACY WILL DISPENSE MEDICATIONS LISTED ON YOUR MEDICATION LIST TO YOU DURING YOUR ADMISSION IN THE HOSPITAL!    Patients discharged on the day of surgery will not be allowed to drive home.  Someone NEEDS to stay with you for the first 24 hours after anesthesia.   Special Instructions: Bring a copy of your healthcare power of attorney and living will documents the day of surgery if you haven't scanned them before.              Please read over the following fact sheets you were given: IF YOU HAVE QUESTIONS ABOUT YOUR PRE-OP INSTRUCTIONS PLEASE CALL [phone] [place]   If you received a COVID test during your pre-op visit  it is requested that you wear a mask when out in public, stay away from anyone that may not be feeling well and notify your surgeon if you develop  symptoms. If you test positive for Covid or have been in contact with anyone that has tested positive in the last 10 days please notify you surgeon.    [facility] - Preparing for Surgery Before surgery, you can play an important role.  Because skin is not sterile, your skin needs to be as free of germs as possible.  You can reduce the number of germs on your skin by washing with CHG (chlorahexidine gluconate) soap before surgery.  CHG is an antiseptic cleaner which kills germs and bonds with the skin to continue killing germs even after washing. Please DO NOT use if you have an allergy to CHG or antibacterial soaps.  If your skin becomes reddened/irritated stop using the CHG and inform your nurse when you arrive at Short Stay. Do not shave (including legs and underarms) for at least 48 hours prior to the first CHG shower.  You may shave your face/neck.  Please follow these instructions carefully:  1.  Shower with CHG Soap the night before surgery and the  morning of surgery.  2.  If you choose to wash your hair, wash your hair first as usual with your normal  shampoo.  3.  After you shampoo, rinse your hair and body thoroughly to remove the shampoo.                             4.  Use CHG as you would any other liquid soap.  You can apply chg directly to the skin and wash.  Gently with a scrungie or clean washcloth.  5.  Apply the CHG Soap to your body ONLY FROM THE NECK DOWN.   Do   not use on face/ open                           Wound or open sores. Avoid contact with eyes, ears mouth and   genitals (private parts).                       Wash face,  Genitals (private parts) with your normal soap.             6.  Wash thoroughly, paying special attention to the area where your    surgery  will be performed.  7.  Thoroughly rinse your body with warm water  from the neck down.  8.  DO NOT shower/wash with your normal soap after using and rinsing off the CHG Soap.                9.  Pat yourself dry  with a clean towel.            10.  Wear clean pajamas.            11.  Place clean sheets on your bed the night of your first shower and do not  sleep with pets. Day of Surgery : Do not apply any lotions/deodorants the morning of surgery.  Please wear clean clothes to the hospital/surgery center.  FAILURE TO FOLLOW THESE INSTRUCTIONS MAY RESULT IN THE CANCELLATION OF YOUR SURGERY  PATIENT SIGNATURE_________________________________  NURSE SIGNATURE__________________________________  ________________________________________________________________________

## 2024-03-14 NOTE — Progress Notes (Signed)
 COVID Vaccine received:  []  No [x]  Yes Date of any COVID positive Test in last 90 days: no PCP - Jamee Antonio Meth DO Cardiologist - Dr. Alm Clay  Chest x-ray - 10/02/23 Epic EKG -  07/27/23 Epic Stress Test -  ECHO - 02/20/20 Epic Cardiac Cath -   Bowel Prep - [x]  No  []   Yes ______  Pacemaker / ICD device [x]  No []  Yes   Spinal Cord Stimulator:[x]  No []  Yes       History of Sleep Apnea? [x]  No []  Yes   CPAP used?- [x]  No []  Yes    Does the patient monitor blood sugar?          []  No [x]  Yes  []  N/A  Patient has: []  NO Hx DM   []  Pre-DM                 []  DM1  [x]   DM2 Does patient have a [employer] or Dexacom? [x]  No []  Yes   Fasting Blood Sugar Ranges- 70-80 Checks Blood Sugar ____2_ times a day  GLP1 agonist / usual dose - Mounjaro -Sat last dose 8/30 GLP1 instructions:  SGLT-2 inhibitors / usual dose - no SGLT-2 instructions:   Blood Thinner / Instructions:no Aspirin Instructions:no  Comments:   Activity level: Patient is able  to climb a flight of stairs without difficulty; [x]  No CP  [x]  No SOB,    Patient can perform ADLs without assistance.   Anesthesia review: Murmur- has had ECHO and has Seen Cardio in Jan. 2025 without issue.  Patient denies shortness of breath, fever, cough and chest pain at PAT appointment.  Patient verbalized understanding and agreement to the Pre-Surgical Instructions that were given to them at this PAT appointment. Patient was also educated of the need to review these PAT instructions again prior to his/her surgery.I reviewed the appropriate phone numbers to call if they have any and questions or concerns.

## 2024-03-15 ENCOUNTER — Other Ambulatory Visit: Payer: Self-pay

## 2024-03-15 ENCOUNTER — Encounter (HOSPITAL_COMMUNITY)
Admission: RE | Admit: 2024-03-15 | Discharge: 2024-03-15 | Disposition: A | Discharge status: Home / Self Care | Source: Ambulatory Visit | Attending: General Surgery | Admitting: General Surgery

## 2024-03-15 ENCOUNTER — Encounter (HOSPITAL_COMMUNITY): Payer: Self-pay

## 2024-03-15 VITALS — Ht 69.5 in | Wt 168.0 lb

## 2024-03-15 DIAGNOSIS — L723 Sebaceous cyst: Secondary | ICD-10-CM | POA: Diagnosis not present

## 2024-03-15 DIAGNOSIS — Z01818 Encounter for other preprocedural examination: Secondary | ICD-10-CM

## 2024-03-15 DIAGNOSIS — E1165 Type 2 diabetes mellitus with hyperglycemia: Secondary | ICD-10-CM

## 2024-03-15 HISTORY — DX: Cardiac murmur, unspecified: R01.1

## 2024-03-15 HISTORY — DX: Malignant (primary) neoplasm, unspecified: C80.1

## 2024-03-15 NOTE — Progress Notes (Signed)
 Called pt. On phone to do PST interview. Pt. Identified herself by stating name and DOB. Pt. Answered all questions. Pre op instructions given. Pt's questions were answered. Phone number to admitting given to pt. To do pre admit.

## 2024-03-15 NOTE — Progress Notes (Unsigned)
 NEUROLOGY FOLLOW UP OFFICE NOTE  [name] [id number]  Assessment/Plan:   Migraine with/without aura, without status migrainosus, not intractable. Dizziness with episode of syncope.  Secondary to vasovagal/orthostatic hypotension - followed by cardiology    Migraine prevention:  Nurtec every other day *** Migraine rescue:  Ubrelvy  100mg  *** Limit use of pain relievers to no more than 9 days out of the month to prevent risk of rebound or medication-overuse headache. Lifestyle modification Keep headache diary Follow up ***     Subjective:  [name] is a 68 year old female with fibromyalgia, RA, HTN, IBS, depression, type 2 diabetes and history of L4-S1 decompression and TIA who follows up for migraines.   UPDATE: Due to increased migraines related to RA flare, Emgality  was changed to Nurtec. *** Intensity:  moderate to severe Duration:  2 hours with Ubrelvy   Frequency:  average 1 every 2-3 days.     No recent episodes of syncope.   Current NSAIDS/analgesics:  Tylenol , tramadol  Current triptans:  none Current ergotamine:  none Current anti-emetic:  Zofran  4mg  Current muscle relaxants:  Flexeril , Robaxin  500mg  Current Antihypertensive medications:  olmesartan , lasix  Current Antidepressant medications:  Cymbalta  30mg  BID Current Anticonvulsant medications:  Lyrica  150mg  TID Current anti-CGRP:  Nurtec 75mg  every other day, Ubrelvy  100mg  Current Vitamins/Herbal/Supplements:  B1, KCl, MVI Current Antihistamines/Decongestants:  none Other therapy:  none Hormone/birth control:  estradiol  Other medications:  Plaquenil , Cimzia , Restoril      HISTORY:  In 2021, she was tried on different immunosuppressant therapies for her RA.  In addition to her RA pain, she has fibromyalgia, osteoarthritis, lumbosacral spondylosis and sacroiliac pain, for which she is managed by pain medicine.  She began experiencing daily headaches in July 2021.  The following month, she began  experiencing dizziness.  She was found to have a new murmur and was referred to cardiology.  Echocardiogram in August was normal with EF 60-65%.  Murmur was thought to be secondary to mild aortic valve sclerosis.  The dizziness occurs spontaneously and not positional.  She will experience severe spinning and lightheadedness lasting several minutes and occurring off and on during the day.  It may occur with the headaches but also by itself.  Headaches are described as moderate pressure in a cap distribution, sometimes associated with nausea, photophobia and phonophobia, sometimes flickers or spots in her vision.  She has a heavy foggy feeling in her head.  They last several hours, sometimes all day.  They may occur daily for a couple of weeks and then they may be episodic with one or two headache-free days in between.  On 07/09/2020, she had two syncopal spells that morning.  The first time, she walked into the kitchen and suddenly passed out.  She woke up and started walking out of the kitchen back to bed when she suddenly passed out again.  Witnessed by her daughter.  The events lasted about 30 seconds, no convulsions, tongue biting, palpitations or postictal confusion. However, she did have urinary incontinence.  Blood pressure was reportedly 92/52.  She is currently wearing a cardiac event monitor.  Carotid ultrasound on 07/30/2020 showed no hemodynamically significant ICA stenosis.  She went to the ED on 08/01/2020 for dizziness, left arm tingling and systolic BP 190s.  CT head personally reviewed was negative for acute abnormalities.  Basic labs and troponin were negative.  EKG showed new RBBB but nothing acute.  She later had MRI and MRA of brain on 08/08/2020  showed mild bilateral mastoid  effusions but otherwise unremarkable.  Since then, blood pressure has been elevated.  EEG on 09/03/2020 was normal.  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      She has remote history of migraines in 1999-2000.  They were severe headaches with diaphoresis, nausea, photophobia and phonophobia.  She was treated with propranolol for a time.  No history of seizures.     Past NSAIDS/analgesics:  none Past abortive triptans:  none Past abortive ergotamine:  none Past muscle relaxants:  none Past anti-emetic:  Reglan , Phenergan  Past antihypertensive medications:  Lasix  Past antidepressant medications:  Amitriptyline, nortriptyline, citalopram  Past anticonvulsant medications:  Gabapentin  Past anti-CGRP:  none Past vitamins/Herbal/Supplements:  none Past antihistamines/decongestants:  Claritin, meclizine  Other past therapies:  none  PAST MEDICAL HISTORY: Past Medical History:  Diagnosis Date   Allergic rhinitis    Anxiety    Asthma    hx bronchial asthma at times with upper resp infection   Depression    Fibromyalgia    GERD (gastroesophageal reflux disease)    Headache(784.0)    migraine and cluster headaches   Hyperlipemia    Hypertension    IBS (irritable bowel syndrome)    Menopause    Neuromuscular disorder (HCC) 2009   hx of fibromyalgia, polyarthralsia (surgery induced)   Osteoarthritis    Rheumatoid arthritis (HCC)    Complicated by osteoarthritis as well.   Rheumatoid arthritis (HCC)    Stress incontinence    at times   Tibial fracture 10/14/2016   evulsion periostial right   Type 2 diabetes mellitus with complication, without long-term current use of insulin  (HCC)    diet controlled no meds -> however was diagnosed with a diabetic foot ulcer.    MEDICATIONS: Current Outpatient Medications on File Prior to Visit  Medication Sig Dispense Refill   acetaminophen  (TYLENOL ) 650 MG CR tablet Take 650 mg by mouth as needed.     Calcium  Carbonate-Vit D-Min (CALTRATE MINIS PLUS MINERALS) 300-800 MG-UNIT TABS Take 2 mg by mouth 2 (two) times daily.     certolizumab pegol  (CIMZIA , 2 SYRINGE,) 200 MG/ML prefilled syringe Inject 400  mg into the skin every 30 (thirty) days.     Cholecalciferol 50 MCG (2000 UT) TABS Take 2,000 Units by mouth daily.     conjugated estrogens  (PREMARIN ) vaginal cream Place 0.5 Applicatorfuls (1 gram) vaginally 3 (three) times a week. (Patient taking differently: Place 1 g vaginally 2 (two) times a week.) 30 g 5   Cyanocobalamin  3000 MCG SUBL Take 3,000 mcg by mouth daily.     cyclobenzaprine  (FLEXERIL ) 10 MG tablet Take 1 tablet (10 mg total) by mouth at bedtime. (Patient taking differently: Take 10 mg by mouth at bedtime as needed for muscle spasms.) 90 tablet 1   diclofenac  Sodium (VOLTAREN ) 1 % GEL Apply 4 g topically 4 (four) times daily. (Patient taking differently: Apply 4 g topically 4 (four) times daily as needed (Joint pain).) 400 g 6   DULoxetine  (CYMBALTA ) 30 MG capsule Take 1 capsule (30 mg total) by mouth 2 (two) times daily. 180 capsule 1   Emollient (DERMEND BRUISE FORMULA) CREA Apply 1 application  topically daily as needed (Wound care).     estradiol  (DOTTI ) 0.1 MG/24HR patch Place 1 patch (0.1 mg total) onto the skin 2 (two) times a week. 24 patch 4   furosemide  (LASIX ) 20 MG tablet Take 1 tablet (20 mg total) by mouth every morning. 90 tablet 1  glucose blood test strip Test blood sugar once daily. Dx Code: E11.9 100 each 12   hydrALAZINE  (APRESOLINE ) 25 MG tablet Take 1 tablet (25 mg total) by mouth 2 (two) times daily. Hold if feeling dizzy or SBP<110 mmHg).  Can use additional 25 mg as needed for SBP>150 mmHg 190 tablet 3   hydroxychloroquine  (PLAQUENIL ) 200 MG tablet Take 2 tablets (400 mg total) by mouth daily. (Patient taking differently: Take 200 mg by mouth 2 (two) times daily.) 180 tablet 0   loratadine (CLARITIN) 10 MG tablet Take 10 mg by mouth as needed (Before RA injection).     methocarbamol  (ROBAXIN ) 500 MG tablet Take 1 tablet (500 mg total) by mouth 4 (four) times daily as needed. (Patient taking differently: Take 500 mg by mouth 4 (four) times daily as needed for  muscle spasms.) 360 tablet 1   Multiple Vitamins-Minerals (CENTRUM ADULTS) TABS take 2 tablets by oral route  every day with food     mupirocin  ointment (BACTROBAN ) 2 % Apply to toes as needed. (Patient taking differently: Apply 1 Application topically daily as needed (Toe).) 22 g 1   olmesartan  (BENICAR ) 20 MG tablet Take 1 tablet (20 mg total) by mouth daily with supper. 90 tablet 3   ondansetron  (ZOFRAN ) 4 MG tablet Take 1 tablet (4 mg total) by mouth every 8 (eight) hours as needed for nausea or vomiting. 20 tablet 2   pantoprazole  (PROTONIX ) 40 MG tablet Take 1 tablet (40 mg total) by mouth daily. (Patient taking differently: Take 40 mg by mouth at bedtime.) 90 tablet 3   potassium chloride  (KLOR-CON  M) 10 MEQ tablet Take 1 tablet (10 mEq total) by mouth daily. 90 tablet 1   pregabalin  (LYRICA ) 150 MG capsule Take 1 capsule (150 mg total) by mouth 3 (three) times daily. Max daily amount: 3 capsules (450mg ) 270 capsule 0   Rimegepant Sulfate  (NURTEC) 75 MG TBDP Take 1 tablet (75 mg total) by mouth every other day. 16 tablet 11   rosuvastatin  (CRESTOR ) 20 MG tablet Take 1 tablet (20 mg total) by mouth daily. (Patient taking differently: Take 20 mg by mouth at bedtime.) 90 tablet 1   temazepam  (RESTORIL ) 30 MG capsule Take 1 capsule (30 mg total) by mouth at bedtime as needed for sleep. 90 capsule 1   thiamine (VITAMIN B-1) 100 MG tablet Take 100 mg daily by mouth.     tirzepatide  (MOUNJARO ) 15 MG/0.5ML Pen Inject 15 mg into the skin once a week. 6 mL 3   traMADol  (ULTRAM ) 50 MG tablet Take 1 tablet (50 mg total) by mouth 3 (three) times daily as needed. Max Daily Amount: 150 mg 90 tablet 1   triamcinolone  ointment (KENALOG ) 0.1 % APPLY A THIN LAYER TO THE AFFECTED AREA(S) BY TOPICAL ROUTE 2 TIMES PER DAY 30 g 2   UBRELVY  100 MG TABS TAKE ONE TABLET BY MOUTH DAILY AS NEEDED FOR MIGRAINE. MAX 2 TABLETS IN 24 HOURS 16 tablet 5   No current facility-administered medications on file prior to visit.      ALLERGIES: Allergies  Allergen Reactions   Gabapentin  Swelling    Pt states she has eye swelling    Isoniazid  Shortness Of Breath   Morphine  Hives and Nausea And Vomiting    Allergic to PCA pump only    Nitrofurantoin Other (See Comments), Rash, Shortness Of Breath and Dermatitis    REACTION: welts   Tamiflu  [Oseltamivir  Phosphate] Nausea And Vomiting    I vomited within 30 minutes  of taking it and was told I cannot ever take it again.   Hctz [Hydrochlorothiazide] Rash   Macrolides And Ketolides Rash   Promethazine  Other (See Comments)    Other Reaction(s): Psychosis  IF GIVEN  IV-hallucinations     CAN TAKE PO PHENERGAN , , Other reaction(s): Hallucinations, IF GIVEN  IV-hallucinations     CAN TAKE PO PHENERGAN , Other reaction(s): Unknown   Wound Dressing Adhesive Rash    Adhesive Tape    Fentanyl  Hives, Itching and Rash    REACTION: welts   Sulfamethoxazole-Trimethoprim Rash    FAMILY HISTORY: Family History  Problem Relation Age of Onset   Diabetes Mother    Stroke Mother    Breast cancer Mother    Atrial fibrillation Mother    Hypertension Mother    Cancer Mother 57       breast   Diabetes Father    Stroke Father 33       2nd 6 month apart   Hypertension Father    Heart attack Father 67       stents   Dementia Father    AAA (abdominal aortic aneurysm) Father 72       repair   CAD Father 58   Thyroid  disease Other    Heart disease Other    COPD Other       Objective:  *** General: No acute distress.  Patient appears well-groomed.   Head:  Normocephalic/atraumatic Neck:  Supple.  No paraspinal tenderness.  Full range of motion. Heart:  Regular rate and rhythm. Neuro:  Alert and oriented.  Speech fluent and not dysarthric.  Language intact.  CN II-XII intact.  Bulk and tone normal.  Muscle strength 5/5 throughout.  Sensation to light touch intact.  Deep tendon reflexes 2+ throughout, toes downgoing.  Gait normal.  Romberg negative. ***    Juliene Dunnings, DO  CC: Amy Antonio Meth, DO

## 2024-03-16 ENCOUNTER — Ambulatory Visit (INDEPENDENT_AMBULATORY_CARE_PROVIDER_SITE_OTHER): Admitting: Neurology

## 2024-03-16 ENCOUNTER — Other Ambulatory Visit (HOSPITAL_BASED_OUTPATIENT_CLINIC_OR_DEPARTMENT_OTHER): Payer: Self-pay

## 2024-03-16 ENCOUNTER — Encounter: Payer: Self-pay | Admitting: Neurology

## 2024-03-16 ENCOUNTER — Other Ambulatory Visit: Payer: Self-pay | Admitting: Neurology

## 2024-03-16 DIAGNOSIS — G43009 Migraine without aura, not intractable, without status migrainosus: Secondary | ICD-10-CM

## 2024-03-16 DIAGNOSIS — G43019 Migraine without aura, intractable, without status migrainosus: Secondary | ICD-10-CM

## 2024-03-16 MED ORDER — UBRELVY 100 MG PO TABS
ORAL_TABLET | ORAL | 5 refills | Status: AC
  Filled 2024-03-16 – 2024-03-25 (×2): qty 16, 30d supply, fill #0
  Filled 2024-06-11: qty 16, 30d supply, fill #1
  Filled 2024-07-09: qty 16, 30d supply, fill #2

## 2024-03-16 MED ORDER — UBRELVY 100 MG PO TABS: ORAL_TABLET | ORAL | 5 refills | Status: DC

## 2024-03-16 NOTE — Patient Instructions (Signed)
 Continue nurtec every other day Ubrelvy  as needed Keep headache diary and bring with you to follow up

## 2024-03-20 NOTE — Anesthesia Preprocedure Evaluation (Addendum)
 Anesthesia Evaluation  Patient identified by MRN, date of birth, ID band Patient awake    Reviewed: Allergy & Precautions, NPO status , Patient's Chart, lab work & pertinent test results  History of Anesthesia Complications Negative for: history of anesthetic complications  Airway Mallampati: III  TM Distance: >3 FB Neck ROM: Full    Dental no notable dental hx.    Pulmonary asthma    Pulmonary exam normal        Cardiovascular hypertension, Pt. on medications + Peripheral Vascular Disease  Normal cardiovascular exam     Neuro/Psych  Headaches  Anxiety Depression       GI/Hepatic Neg liver ROS,GERD  Medicated and Controlled,,  Endo/Other  diabetes (on Mounjaro ), Type 2    Renal/GU negative Renal ROS     Musculoskeletal  (+) Arthritis , Osteoarthritis and Rheumatoid disorders,  Fibromyalgia -  Abdominal   Peds  Hematology negative hematology ROS (+)   Anesthesia Other Findings RIGHT CYST ON BUTTOCK  Reproductive/Obstetrics                              Anesthesia Physical Anesthesia Plan  ASA: 2  Anesthesia Plan: General   Post-op Pain Management: Tylenol  PO (pre-op)*   Induction:   PONV Risk Score and Plan: 3 and Treatment may vary due to age or medical condition, Midazolam , Ondansetron  and Dexamethasone   Airway Management Planned: LMA  Additional Equipment: None  Intra-op Plan:   Post-operative Plan: Extubation in OR  Informed Consent: I have reviewed the patients History and Physical, chart, labs and discussed the procedure including the risks, benefits and alternatives for the proposed anesthesia with the patient or authorized representative who has indicated his/her understanding and acceptance.     Dental advisory given  Plan Discussed with: CRNA  Anesthesia Plan Comments:          Anesthesia Quick Evaluation

## 2024-03-21 ENCOUNTER — Other Ambulatory Visit: Payer: Self-pay

## 2024-03-21 ENCOUNTER — Encounter (HOSPITAL_COMMUNITY): Payer: Self-pay | Admitting: General Surgery

## 2024-03-21 ENCOUNTER — Encounter (HOSPITAL_COMMUNITY): Payer: Self-pay | Admitting: Anesthesiology

## 2024-03-21 ENCOUNTER — Ambulatory Visit (HOSPITAL_BASED_OUTPATIENT_CLINIC_OR_DEPARTMENT_OTHER): Payer: Self-pay | Admitting: Anesthesiology

## 2024-03-21 ENCOUNTER — Encounter (HOSPITAL_COMMUNITY)
Admission: RE | Disposition: A | Discharge status: Home / Self Care | Payer: Self-pay | Source: Home / Self Care | Attending: General Surgery

## 2024-03-21 ENCOUNTER — Ambulatory Visit (HOSPITAL_COMMUNITY)
Admission: RE | Admit: 2024-03-21 | Discharge: 2024-03-21 | Disposition: A | Discharge status: Home / Self Care | Attending: General Surgery | Admitting: General Surgery

## 2024-03-21 DIAGNOSIS — E119 Type 2 diabetes mellitus without complications: Secondary | ICD-10-CM | POA: Insufficient documentation

## 2024-03-21 DIAGNOSIS — M797 Fibromyalgia: Secondary | ICD-10-CM | POA: Insufficient documentation

## 2024-03-21 DIAGNOSIS — J45909 Unspecified asthma, uncomplicated: Secondary | ICD-10-CM | POA: Insufficient documentation

## 2024-03-21 DIAGNOSIS — R1900 Intra-abdominal and pelvic swelling, mass and lump, unspecified site: Secondary | ICD-10-CM | POA: Diagnosis not present

## 2024-03-21 DIAGNOSIS — K219 Gastro-esophageal reflux disease without esophagitis: Secondary | ICD-10-CM | POA: Insufficient documentation

## 2024-03-21 DIAGNOSIS — E785 Hyperlipidemia, unspecified: Secondary | ICD-10-CM | POA: Diagnosis not present

## 2024-03-21 DIAGNOSIS — Z01818 Encounter for other preprocedural examination: Secondary | ICD-10-CM

## 2024-03-21 DIAGNOSIS — I10 Essential (primary) hypertension: Secondary | ICD-10-CM | POA: Diagnosis not present

## 2024-03-21 DIAGNOSIS — M069 Rheumatoid arthritis, unspecified: Secondary | ICD-10-CM | POA: Insufficient documentation

## 2024-03-21 DIAGNOSIS — D171 Benign lipomatous neoplasm of skin and subcutaneous tissue of trunk: Secondary | ICD-10-CM | POA: Diagnosis not present

## 2024-03-21 DIAGNOSIS — Z7985 Long-term (current) use of injectable non-insulin antidiabetic drugs: Secondary | ICD-10-CM | POA: Diagnosis not present

## 2024-03-21 DIAGNOSIS — R2241 Localized swelling, mass and lump, right lower limb: Secondary | ICD-10-CM | POA: Insufficient documentation

## 2024-03-21 DIAGNOSIS — E1165 Type 2 diabetes mellitus with hyperglycemia: Secondary | ICD-10-CM

## 2024-03-21 HISTORY — PX: CYST REMOVAL LEG: SHX6280

## 2024-03-21 LAB — CBC
HCT: 38.4 % (ref 36.0–46.0)
Hemoglobin: 12.2 g/dL (ref 12.0–15.0)
MCH: 29.6 pg (ref 26.0–34.0)
MCHC: 31.8 g/dL (ref 30.0–36.0)
MCV: 93.2 fL (ref 80.0–100.0)
Platelets: 146 K/uL — ABNORMAL LOW (ref 150–400)
RBC: 4.12 MIL/uL (ref 3.87–5.11)
RDW: 12.8 % (ref 11.5–15.5)
WBC: 4.5 K/uL (ref 4.0–10.5)
nRBC: 0 % (ref 0.0–0.2)

## 2024-03-21 LAB — BASIC METABOLIC PANEL WITH GFR
Anion gap: 10 (ref 5–15)
BUN: 8 mg/dL (ref 8–23)
CO2: 26 mmol/L (ref 22–32)
Calcium: 9 mg/dL (ref 8.9–10.3)
Chloride: 106 mmol/L (ref 98–111)
Creatinine, Ser: 0.71 mg/dL (ref 0.44–1.00)
GFR, Estimated: >60 mL/min
Glucose, Bld: 91 mg/dL (ref 70–99)
Potassium: 3.7 mmol/L (ref 3.5–5.1)
Sodium: 142 mmol/L (ref 135–145)

## 2024-03-21 LAB — GLUCOSE, CAPILLARY
Glucose-Capillary: 79 mg/dL (ref 70–99)
Glucose-Capillary: 89 mg/dL (ref 70–99)

## 2024-03-21 SURGERY — EXCISION, CYST, LOWER EXTREMITY
Anesthesia: General | Laterality: Right

## 2024-03-21 MED ORDER — CHLORHEXIDINE GLUCONATE CLOTH 2 % EX PADS: 6.0000 | MEDICATED_PAD | Freq: Once | CUTANEOUS | Status: DC

## 2024-03-21 MED ORDER — ACETAMINOPHEN 500 MG PO TABS
1000.0000 mg | ORAL_TABLET | ORAL | Status: AC
  Administered 2024-03-21: 1000 mg via ORAL
  Filled 2024-03-21: qty 2

## 2024-03-21 MED ORDER — ONDANSETRON HCL 4 MG/2ML IJ SOLN
INTRAMUSCULAR | Status: AC
  Filled 2024-03-21: qty 2

## 2024-03-21 MED ORDER — DEXAMETHASONE SODIUM PHOSPHATE 10 MG/ML IJ SOLN
INTRAMUSCULAR | Status: AC
  Filled 2024-03-21: qty 1

## 2024-03-21 MED ORDER — OXYCODONE HCL 5 MG PO TABS: 5.0000 mg | ORAL_TABLET | Freq: Once | ORAL | Status: DC | PRN

## 2024-03-21 MED ORDER — ONDANSETRON HCL 4 MG/2ML IJ SOLN
INTRAMUSCULAR | Status: DC | PRN
  Administered 2024-03-21: 4 mg via INTRAVENOUS

## 2024-03-21 MED ORDER — INSULIN ASPART 100 UNIT/ML IJ SOLN: 0.0000 [IU] | INTRAMUSCULAR | Status: DC | PRN

## 2024-03-21 MED ORDER — LACTATED RINGERS IV SOLN: INTRAVENOUS | Status: DC

## 2024-03-21 MED ORDER — CEFAZOLIN SODIUM-DEXTROSE 2-4 GM/100ML-% IV SOLN
2.0000 g | INTRAVENOUS | Status: AC
  Administered 2024-03-21: 2 g via INTRAVENOUS
  Filled 2024-03-21: qty 100

## 2024-03-21 MED ORDER — MIDAZOLAM HCL 2 MG/2ML IJ SOLN
INTRAMUSCULAR | Status: AC
  Filled 2024-03-21: qty 2

## 2024-03-21 MED ORDER — FENTANYL CITRATE (PF) 100 MCG/2ML IJ SOLN
INTRAMUSCULAR | Status: DC | PRN
  Administered 2024-03-21 (×2): 25 ug via INTRAVENOUS

## 2024-03-21 MED ORDER — HYDROMORPHONE HCL 1 MG/ML IJ SOLN: 0.2500 mg | INTRAMUSCULAR | Status: DC | PRN

## 2024-03-21 MED ORDER — BUPIVACAINE-EPINEPHRINE (PF) 0.25% -1:200000 IJ SOLN
INTRAMUSCULAR | Status: AC
  Filled 2024-03-21: qty 30

## 2024-03-21 MED ORDER — FENTANYL CITRATE (PF) 100 MCG/2ML IJ SOLN
INTRAMUSCULAR | Status: AC
  Filled 2024-03-21: qty 2

## 2024-03-21 MED ORDER — PROPOFOL 10 MG/ML IV BOLUS
INTRAVENOUS | Status: AC
  Filled 2024-03-21: qty 20

## 2024-03-21 MED ORDER — DEXAMETHASONE SODIUM PHOSPHATE 10 MG/ML IJ SOLN
INTRAMUSCULAR | Status: DC | PRN
  Administered 2024-03-21: 5 mg via INTRAVENOUS

## 2024-03-21 MED ORDER — MIDAZOLAM HCL 2 MG/2ML IJ SOLN
INTRAMUSCULAR | Status: DC | PRN
  Administered 2024-03-21: 2 mg via INTRAVENOUS

## 2024-03-21 MED ORDER — BUPIVACAINE-EPINEPHRINE 0.25% -1:200000 IJ SOLN
INTRAMUSCULAR | Status: DC | PRN
  Administered 2024-03-21: 10 mL

## 2024-03-21 MED ORDER — DROPERIDOL 2.5 MG/ML IJ SOLN: 0.6250 mg | Freq: Once | INTRAMUSCULAR | Status: DC | PRN

## 2024-03-21 MED ORDER — CHLORHEXIDINE GLUCONATE 0.12 % MT SOLN
15.0000 mL | Freq: Once | OROMUCOSAL | Status: AC
  Administered 2024-03-21: 15 mL via OROMUCOSAL

## 2024-03-21 MED ORDER — OXYCODONE HCL 5 MG/5ML PO SOLN: 5.0000 mg | Freq: Once | ORAL | Status: DC | PRN

## 2024-03-21 MED ORDER — PROPOFOL 500 MG/50ML IV EMUL
INTRAVENOUS | Status: DC | PRN
  Administered 2024-03-21: 75 ug/kg/min via INTRAVENOUS
  Administered 2024-03-21: 150 mg via INTRAVENOUS

## 2024-03-21 MED ORDER — EPHEDRINE SULFATE-NACL 50-0.9 MG/10ML-% IV SOSY
PREFILLED_SYRINGE | INTRAVENOUS | Status: DC | PRN
  Administered 2024-03-21 (×2): 5 mg via INTRAVENOUS

## 2024-03-21 MED ORDER — LIDOCAINE HCL (PF) 2 % IJ SOLN
INTRAMUSCULAR | Status: DC | PRN
  Administered 2024-03-21: 60 mg via INTRADERMAL

## 2024-03-21 MED ORDER — ORAL CARE MOUTH RINSE: 15.0000 mL | Freq: Once | OROMUCOSAL | Status: AC

## 2024-03-21 MED ORDER — 0.9 % SODIUM CHLORIDE (POUR BTL) OPTIME
TOPICAL | Status: DC | PRN
  Administered 2024-03-21: 1000 mL

## 2024-03-21 SURGICAL SUPPLY — 26 items
BAG COUNTER SPONGE SURGICOUNT (BAG) IMPLANT
BENZOIN TINCTURE PRP APPL 2/3 (GAUZE/BANDAGES/DRESSINGS) IMPLANT
BLADE CLIPPER SURG (BLADE) IMPLANT
CHLORAPREP W/TINT 26 (MISCELLANEOUS) IMPLANT
COVER SURGICAL LIGHT HANDLE (MISCELLANEOUS) ×1 IMPLANT
DRAPE LAPAROTOMY T 98X78 PEDS (DRAPES) ×1 IMPLANT
DRAPE SURG IRRIG POUCH 19X23 (DRAPES) IMPLANT
DRSG TEGADERM 4X4.75 (GAUZE/BANDAGES/DRESSINGS) IMPLANT
ELECT REM PT RETURN 15FT ADLT (MISCELLANEOUS) ×1 IMPLANT
GAUZE SPONGE 4X4 12PLY STRL (GAUZE/BANDAGES/DRESSINGS) IMPLANT
GLOVE BIOGEL PI IND STRL 7.5 (GLOVE) ×1 IMPLANT
GLOVE SURG SS PI 7.0 STRL IVOR (GLOVE) ×1 IMPLANT
GOWN STRL REUS W/ TWL LRG LVL3 (GOWN DISPOSABLE) ×1 IMPLANT
KIT BASIN OR (CUSTOM PROCEDURE TRAY) ×1 IMPLANT
KIT TURNOVER KIT A (KITS) ×1 IMPLANT
LEGGING LITHOTOMY PAIR STRL (DRAPES) IMPLANT
NDL HYPO 25X1 1.5 SAFETY (NEEDLE) ×1 IMPLANT
NEEDLE HYPO 25X1 1.5 SAFETY (NEEDLE) ×1 IMPLANT
PACK GENERAL/GYN (CUSTOM PROCEDURE TRAY) ×1 IMPLANT
STRIP CLOSURE SKIN 1/2X4 (GAUZE/BANDAGES/DRESSINGS) IMPLANT
SUT MNCRL AB 4-0 PS2 18 (SUTURE) IMPLANT
SUT VIC AB 2-0 SH 27X BRD (SUTURE) IMPLANT
SUT VICRYL 3-0 CR8 SH (SUTURE) ×1 IMPLANT
SYR CONTROL 10ML LL (SYRINGE) ×1 IMPLANT
TAPE CLOTH SURG 4X10 WHT LF (GAUZE/BANDAGES/DRESSINGS) IMPLANT
TOWEL OR 17X26 10 PK STRL BLUE (TOWEL DISPOSABLE) ×1 IMPLANT

## 2024-03-21 NOTE — Anesthesia Procedure Notes (Signed)
 Procedure Name: LMA Insertion Date/Time: 03/21/2024 8:42 AM  Performed by: Memory Armida LABOR, CRNAPre-anesthesia Checklist: Emergency Drugs available, Patient identified, Suction available, Patient being monitored and Timeout performed Patient Re-evaluated:Patient Re-evaluated prior to induction Oxygen Delivery Method: Circle system utilized Preoxygenation: Pre-oxygenation with 100% oxygen Induction Type: Combination inhalational/ intravenous induction LMA: LMA with gastric port inserted LMA Size: 4.0 Number of attempts: 1 Placement Confirmation: positive ETCO2 and breath sounds checked- equal and bilateral Tube secured with: Tape Dental Injury: Teeth and Oropharynx as per pre-operative assessment

## 2024-03-21 NOTE — Transfer of Care (Signed)
 Immediate Anesthesia Transfer of Care Note  Patient: [name]  Procedure(s) Performed: EXCISION MASS ON RIGHT HIP (Right)  Patient Location: PACU  Anesthesia Type:General  Level of Consciousness: drowsy and patient cooperative  Airway & Oxygen Therapy: Patient Spontanous Breathing and Patient connected to face mask oxygen  Post-op Assessment: Report given to RN, Post -op Vital signs reviewed and stable, and Patient moving all extremities  Post vital signs: Reviewed and stable  Last Vitals:  Vitals Value Taken Time  BP 125/61 03/21/24 09:32  Temp    Pulse 69 03/21/24 09:34  Resp 11 03/21/24 09:34  SpO2 100 % 03/21/24 09:34  Vitals shown include unfiled device data.  Last Pain:  Vitals:   03/21/24 0639  TempSrc: Oral  PainSc: 7       Patients Stated Pain Goal: 3 (for post op pain) (03/21/24 9360)  Complications: No notable events documented.

## 2024-03-21 NOTE — H&P (Signed)
 Chief Complaint  Patient presents with  RE-CHECK   Subjective  [name] is a 68 y.o. female patient in today for: RE-CHECK  Her cyst got smaller but is still there as a pea size now.  Social Drivers of Health with Concerns   Physical Activity: Insufficiently Active (09/07/2023)  [facility]  Exercise Vital Sign  On average, how many days per week do you engage in moderate to strenuous exercise (like a brisk walk)?: 2 days  On average, how many minutes do you engage in exercise at this level?: 20 min  Housing Stability: Unknown (01/19/2024)  Housing Stability Vital Sign  Homeless in the Last Year: No    No data to display      Outpatient Medications Prior to Visit  Medication Sig Dispense Refill  calcium  phosphate-vitamin D3 250 mg-10 mcg (400 unit) Chew take 1 tablet by oral route 2 times every day  cholecalciferol, vitamin D3, (VITAMIN D3) 125 mcg (5,000 unit) tablet Take 1 tablet by mouth once daily  conjugated estrogens  (PREMARIN ) 0.625 mg/gram vaginal cream Premarin  0.625 mg/gram vaginal cream  cyclobenzaprine  (FLEXERIL ) 10 MG tablet cyclobenzaprine  10 mg tablet TAKE 1 TABLET BY MOUTH EVERY NIGHT AT BEDTIME AS NEEDED  diclofenac  (VOLTAREN ) 1 % topical gel apply (4G) by topical route 4 times every day to the affected area(s)  DULoxetine  (CYMBALTA ) 30 MG DR capsule Take by mouth every 12 (twelve) hours  estradiol  (VIVELLE -DOT) patch 0.1 mg/24 hr 1 patch to skin  folic acid  (FOLVITE ) 1 MG tablet 1 tablet  FUROsemide  (LASIX ) 20 MG tablet  hydrALAZINE  (APRESOLINE ) 25 MG tablet as directed  hydrOXYchloroQUINE  (PLAQUENIL ) 200 mg tablet Take by mouth every 12 (twelve) hours  methocarbamoL  (ROBAXIN ) 500 MG tablet methocarbamol  500 mg tablet TAKE 1 TABLET BY MOUTH EVERY 8 HOURS AS NEEDED FOR 10 DAYS  multivitamin with iron (CENTRUM) tablet take 2 tablets by oral route every day with food  olmesartan  (BENICAR ) 20 MG tablet Take 1 tablet by mouth once daily   pantoprazole  (PROTONIX ) 40 MG DR tablet Take 40 mg by mouth once daily  potassium chloride  (KLOR-CON ) 10 mEq ER tablet potassium chloride  ER 10 mEq tablet,extended release(part/cryst)  pregabalin  (LYRICA ) 150 MG capsule 1 capsule  rosuvastatin  (CRESTOR ) 20 MG tablet rosuvastatin  20 mg tablet TAKE 1 TABLET (20 MG TOTAL) BY MOUTH AT BEDTIME.  temazepam  (RESTORIL ) 30 mg capsule Take by mouth daily  traMADoL  (ULTRAM ) 50 mg tablet  ubrogepant  (UBRELVY ) 100 mg Tab Take by mouth  galcanezumab -gnlm (EMGALITY  PEN) 120 mg/mL PnIj Inject subcutaneously  golimumab  (SIMPONI ) 50 mg/0.5 mL injection Inject 50 mg subcutaneously monthly  oxyCODONE -acetaminophen  (PERCOCET) 7.5-325 mg tablet 1 tablet as needed  OZEMPIC  0.25 mg or 0.5 mg(2 mg/1.5 mL) pen injector  tocilizumab  (ACTEMRA ) 400 mg/20 mL (20 mg/mL) injection Inject into the vein   No facility-administered medications prior to visit.     Objective   Vitals:  03/15/24 1601  PainSc: 0-No pain   There is no height or weight on file to calculate BMI.  Physical Exam Constitutional:  Appearance: Normal appearance.  HENT:  Head: Normocephalic and atraumatic.  Pulmonary:  Effort: Pulmonary effort is normal.  Musculoskeletal:  General: Normal range of motion.  Cervical back: Normal range of motion.  Skin: Comments: Small right cyst palpable at the gluteal/thigh fold  Neurological:  General: No focal deficit present.  Mental Status: She is alert and oriented to person, place, and time. Mental status is at baseline.  Psychiatric:  Mood and Affect: Mood  normal.  Behavior: Behavior normal.  Thought Content: Thought content normal.     Assessment/Plan:   Diagnoses and all orders for this visit:  Sebaceous cyst  Proceed with cyst excision as scheduled

## 2024-03-21 NOTE — Anesthesia Postprocedure Evaluation (Signed)
 Anesthesia Post Note  Patient: [name]  Procedure(s) Performed: EXCISION MASS ON RIGHT HIP (Right)     Patient location during evaluation: PACU Anesthesia Type: General Level of consciousness: awake and alert Pain management: pain level controlled Vital Signs Assessment: post-procedure vital signs reviewed and stable Respiratory status: spontaneous breathing, nonlabored ventilation and respiratory function stable Cardiovascular status: blood pressure returned to baseline Postop Assessment: no apparent nausea or vomiting Anesthetic complications: no   No notable events documented.  Last Vitals:  Vitals:   03/21/24 1000 03/21/24 1015  BP: 127/78 133/67  Pulse: 68 62  Resp: 14 13  Temp:  (!) 36.1 C  SpO2: 96% 96%    Last Pain:  Vitals:   03/21/24 1015  TempSrc:   PainSc: 0-No pain                 Vertell Row

## 2024-03-21 NOTE — Op Note (Signed)
 Preoperative diagnosis: right hip mass  Postoperative diagnosis: same   Procedure: excision of right hip mass  Surgeon: Herlene Bureau, M.D.  Asst: none  Anesthesia: general LMA  Indications for procedure: [name] is a 68 y.o. year old female with symptoms of pain and area of swelling. On exam she had a 1 cm mobile firm mass in the posterior subcutaneous tissue under the lateral pelvis near the hip crease. She presents for excision.  Description of procedure: The patient was brought into the operative suite. Anesthesia was administered with General LMA anesthesia. WHO checklist was applied. The patient was then placed in lithotomy position. The area was prepped and draped in the usual sterile fashion.  Next, an incision was made over the area of palpable mass. Cautery was used to dissect a firmness of tissue out of the area. Palpation was used 2 times to ensure all firm tissue was out of the area. The mass was sent in 3 pieces. A portion of the tissue sent was likely perifascial tissue. Cautery was used for hemostasis. Deep dermal layer was closed with 3-0 vicryl. 4-0 Monocryl was used to close the skin. Bandage was put in place.  Findings: palpable 1 cm firmness within sent tissue  Specimen: right hip mass  Implant: none   Blood loss: 10 ml  Local anesthesia: 10 ml marcaine    Complications: none  Herlene Bureau, M.D. General, Bariatric, & Minimally Invasive Surgery [facility]

## 2024-03-22 ENCOUNTER — Encounter (HOSPITAL_COMMUNITY): Payer: Self-pay | Admitting: General Surgery

## 2024-03-22 LAB — SURGICAL PATHOLOGY

## 2024-03-26 ENCOUNTER — Other Ambulatory Visit (HOSPITAL_BASED_OUTPATIENT_CLINIC_OR_DEPARTMENT_OTHER): Payer: Self-pay

## 2024-03-26 MED ORDER — FLUZONE HIGH-DOSE 0.5 ML IM SUSY
0.5000 mL | PREFILLED_SYRINGE | Freq: Once | INTRAMUSCULAR | 0 refills | Status: AC
  Filled 2024-03-26: qty 0.5, 1d supply, fill #0

## 2024-03-26 MED ORDER — COMIRNATY 30 MCG/0.3ML IM SUSY
0.3000 mL | PREFILLED_SYRINGE | Freq: Once | INTRAMUSCULAR | 0 refills | Status: AC
Start: 2024-03-26 — End: 2024-03-27
  Filled 2024-03-26: qty 0.3, 1d supply, fill #0

## 2024-03-27 ENCOUNTER — Other Ambulatory Visit (HOSPITAL_BASED_OUTPATIENT_CLINIC_OR_DEPARTMENT_OTHER): Payer: Self-pay

## 2024-03-28 ENCOUNTER — Ambulatory Visit (INDEPENDENT_AMBULATORY_CARE_PROVIDER_SITE_OTHER): Admitting: Podiatry

## 2024-03-28 ENCOUNTER — Encounter: Payer: Self-pay | Admitting: Podiatry

## 2024-03-28 ENCOUNTER — Other Ambulatory Visit (HOSPITAL_BASED_OUTPATIENT_CLINIC_OR_DEPARTMENT_OTHER): Payer: Self-pay

## 2024-03-28 DIAGNOSIS — B351 Tinea unguium: Secondary | ICD-10-CM

## 2024-03-28 DIAGNOSIS — E1142 Type 2 diabetes mellitus with diabetic polyneuropathy: Secondary | ICD-10-CM

## 2024-03-28 DIAGNOSIS — M79674 Pain in right toe(s): Secondary | ICD-10-CM

## 2024-03-28 DIAGNOSIS — M79675 Pain in left toe(s): Secondary | ICD-10-CM | POA: Diagnosis not present

## 2024-03-29 ENCOUNTER — Other Ambulatory Visit (HOSPITAL_BASED_OUTPATIENT_CLINIC_OR_DEPARTMENT_OTHER): Payer: Self-pay

## 2024-03-29 ENCOUNTER — Other Ambulatory Visit (HOSPITAL_COMMUNITY): Payer: Self-pay

## 2024-03-29 ENCOUNTER — Telehealth: Payer: Self-pay | Admitting: Pharmacy Technician

## 2024-03-29 NOTE — Telephone Encounter (Signed)
 Pharmacy Patient Advocate Encounter  Received notification from [employer] that Prior Authorization for NURTECT 75MG  has been APPROVED from 9.18.25 to 9.17.26. Ran test claim, Copay is $0. This test claim was processed through [facility]- copay amounts may vary at other pharmacies due to pharmacy/plan contracts, or as the patient moves through the different stages of their insurance plan.   PA #/Case ID/Reference #: [id number]

## 2024-04-01 NOTE — Progress Notes (Signed)
 Subjective:  Patient ID: [name], female    DOB: [birth year],  MRN: [id number]  [name] presents to clinic today for at risk foot care with history of diabetic neuropathy and painful thick toenails that are difficult to trim. Pain interferes with ambulation. Aggravating factors include wearing enclosed shoe gear. Pain is relieved with periodic professional debridement.  Chief Complaint  Patient presents with   Diabetes    DFC NIDDM A1C 5.5. LOV with PCP 01/09/24.   New problem(s): None.   PCP is Antonio Meth, [place], DO.  Allergies  Allergen Reactions   Gabapentin  Swelling    Pt states she has eye swelling    Isoniazid  Shortness Of Breath   Morphine  Hives and Nausea And Vomiting    Allergic to PCA pump only    Nitrofurantoin Other (See Comments), Rash, Shortness Of Breath and Dermatitis    REACTION: welts   Tamiflu  [Oseltamivir  Phosphate] Nausea And Vomiting    I vomited within 30 minutes of taking it and was told I cannot ever take it again.   Hctz [Hydrochlorothiazide] Rash   Macrolides And Ketolides Rash   Promethazine  Other (See Comments)    Other Reaction(s): Psychosis  IF GIVEN  IV-hallucinations     CAN TAKE PO PHENERGAN , , Other reaction(s): Hallucinations, IF GIVEN  IV-hallucinations     CAN TAKE PO PHENERGAN , Other reaction(s): Unknown   Wound Dressing Adhesive Rash    Adhesive Tape    Fentanyl  Hives, Itching and Rash    REACTION: welts   Sulfamethoxazole-Trimethoprim Rash    Review of Systems: Negative except as noted in the HPI.  Objective: No changes noted in today's physical examination. There were no vitals filed for this visit. [name] is a pleasant 68 y.o. female WD, WN in NAD. AAO x 3.  Vascular Examination: Capillary refill time immediate b/l. Palpable pedal pulses. Pedal hair sparse b/l. No pain with calf compression b/l. Skin temperature gradient WNL b/l. No cyanosis or clubbing b/l. No ischemia or gangrene noted b/l.    Neurological Examination: Pt has subjective symptoms of neuropathy.  Dermatological Examination: Pedal skin with normal turgor, texture and tone b/l.  No open wounds. No interdigital macerations.   Toenails 1-5 b/l thick, discolored,  with subungual debris and pain on dorsal palpation.   No corns, calluses nor porokeratotic lesions noted.  Musculoskeletal Examination: Muscle strength 5/5 to all lower extremity muscle groups bilaterally. HAV with bunion deformity noted b/l LE. Clawtoe deformity 1-5 bilaterally.. No pain, crepitus or joint limitation noted with ROM b/l LE.  Patient ambulates independently without assistive aids.  Radiographs: None  Assessment/Plan: 1. Pain due to onychomycosis of toenails of both feet   2. Diabetic peripheral neuropathy associated with type 2 diabetes mellitus (HCC)   Patient was evaluated and treated. All patient's and/or POA's questions/concerns addressed on today's visit. Toenails 1-5 debrided in length and girth without incident. Continue foot and shoe inspections daily. Monitor blood glucose per PCP/Endocrinologist's recommendations. Continue soft, supportive shoe gear daily. Report any pedal injuries to medical professional. Call office if there are any questions/concerns.  No follow-ups on file.  Delon LITTIE Merlin, DPM      [facility] LOCATION: [address]  [place], [place] [address]                   Office [phone]   [facility] LOCATION: [address] [place], [place] [address] Office [phone]

## 2024-04-02 ENCOUNTER — Other Ambulatory Visit: Payer: Self-pay | Admitting: Neurology

## 2024-04-02 DIAGNOSIS — G43009 Migraine without aura, not intractable, without status migrainosus: Secondary | ICD-10-CM

## 2024-04-02 DIAGNOSIS — G43019 Migraine without aura, intractable, without status migrainosus: Secondary | ICD-10-CM

## 2024-04-05 ENCOUNTER — Other Ambulatory Visit (HOSPITAL_BASED_OUTPATIENT_CLINIC_OR_DEPARTMENT_OTHER): Payer: Self-pay

## 2024-04-06 ENCOUNTER — Ambulatory Visit: Admitting: Family Medicine

## 2024-04-06 ENCOUNTER — Other Ambulatory Visit (HOSPITAL_BASED_OUTPATIENT_CLINIC_OR_DEPARTMENT_OTHER): Payer: Self-pay

## 2024-04-06 ENCOUNTER — Encounter: Payer: Self-pay | Admitting: Family Medicine

## 2024-04-06 VITALS — BP 100/60 | HR 74 | Temp 98.3°F | Resp 18 | Ht 69.0 in | Wt 168.4 lb

## 2024-04-06 DIAGNOSIS — T8149XA Infection following a procedure, other surgical site, initial encounter: Secondary | ICD-10-CM

## 2024-04-06 MED ORDER — DOXYCYCLINE HYCLATE 100 MG PO TABS
100.0000 mg | ORAL_TABLET | Freq: Two times a day (BID) | ORAL | 0 refills | Status: AC
  Filled 2024-04-06: qty 20, 10d supply, fill #0

## 2024-04-06 NOTE — Progress Notes (Signed)
 Subjective:    Patient ID: [name], female    DOB: 1955/08/27, 68 y.o.   MRN: [id number]  Chief Complaint  Patient presents with   Wound Check    Pt states having a wound on her bottom. Pt states having chills and low grade fever at night. Pt states she surgeon office didn't help.    HPI Patient is in today for wound infection.  Discussed the use of AI scribe software for clinical note transcription with the patient, who gave verbal consent to proceed.  History of Present Illness [name] is a 68 year old female who presents with postoperative wound drainage following kidney cyst removal surgery.  She underwent surgery for the removal of a kidney cyst on Wednesday, March 21, 2024. The cyst was initially large, then reduced in size, but later increased again, prompting the surgical intervention. Post-surgery, she began experiencing drainage from the surgical site over the weekend following the procedure.  The drainage is significant, soaking through sanitary pads and four by fours, and is located in the right hip area, specifically in the buttock region. It is sometimes blood-tinged and persistent, causing her clothes to become wet and requiring her to use protective pads under her while sleeping.  She has been experiencing chills, feeling hot, and having cold hands and feet, with a recorded temperature of 99.93F. She has been taking Tylenol  at night to manage these symptoms. She is concerned about the possibility of infection.  The top layer of the wound, which was closed with Dermabond, has opened. She has been using sanitary pads to manage the drainage. She has not been provided with SteriStrips or additional sutures by the medical office she visited post-surgery.  The cyst removed was approximately one centimeter in size, and the surgeon had to remove three pieces to excise it completely. She is frustrated with the ongoing drainage and the impact it has on her daily  life, including the need to frequently change pads and protect her clothing and bedding.    Past Medical History:  Diagnosis Date   Allergic rhinitis    Anxiety    Asthma    hx bronchial asthma at times with upper resp infection   Cancer (HCC)    basal cell   Depression    Fibromyalgia    GERD (gastroesophageal reflux disease)    Headache(784.0)    migraine and cluster headaches   Heart murmur    Hyperlipemia    Hypertension    IBS (irritable bowel syndrome)    Menopause    Neuromuscular disorder (HCC) 2009   hx of fibromyalgia, polyarthralsia (surgery induced)   Osteoarthritis    Purpura    Rheumatoid arthritis (HCC)    Complicated by osteoarthritis as well.   Rheumatoid arthritis (HCC)    Stress incontinence    at times   Tibial fracture 10/14/2016   evulsion periostial right   Type 2 diabetes mellitus with complication, without long-term current use of insulin  (HCC)    diet controlled no meds -> however was diagnosed with a diabetic foot ulcer.    Past Surgical History:  Procedure Laterality Date   ABDOMINAL HYSTERECTOMY  12/1999   complete   CARPAL TUNNEL RELEASE Right 12/27/2017   Procedure: CARPAL TUNNEL RELEASE;  Surgeon: Camella Fallow, MD;  Location: [facility];  Service: Orthopedics;  Laterality: Right;   CERVICAL CONIZATION W/BX  june 1990   dysplasia of cervix/used 5Fu cream for 3 months   CERVICAL LAMINECTOMY  2005 &  2009   X 2   NO ROM PROBLEMS   CESAREAN SECTION  1989 & 1993   X 2   CHOLECYSTECTOMY  1986   CYST REMOVAL LEG Right 03/21/2024   Procedure: EXCISION MASS ON RIGHT HIP;  Surgeon: Kinsinger, Herlene Righter, MD;  Location: [facility];  Service: General;  Laterality: Right;   DILATION AND CURETTAGE OF UTERUS   april-1989   missed abortion   ESOPHAGOGASTRODUODENOSCOPY  06/29/2011   Procedure: ESOPHAGOGASTRODUODENOSCOPY (EGD);  Surgeon: Alm VEAR Angle, MD;  Location: [facility];  Service: General;  Laterality: N/A;   FOOT SURGERY  2005   RT HEEL    GASTRIC BANDING PORT REVISION  09/24/2011   Procedure: GASTRIC BANDING PORT REVISION;  Surgeon: Donnice KATHEE Lunger, MD;  Location: [facility];  Service: General;  Laterality: N/A;   GASTRIC ROUX-EN-Y N/A 06/06/2017   Procedure: 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;  Surgeon: Lunger Donnice, MD;  Location: [facility];  Service: General;  Laterality: N/A;   HYSTEROSCOPY  1999   LAPAROSCOPIC GASTRIC BANDING  12/30/09   LAPAROSCOPIC GASTRIC SLEEVE RESECTION N/A 07/02/2013   Procedure: LAPAROSCOPIC GASTRIC SLEEVE RESECTION upper endoscopy;  Surgeon: Donnice KATHEE Lunger, MD;  Location: [facility];  Service: General;  Laterality: N/A;   LAPAROSCOPIC REPAIR AND REMOVAL OF GASTRIC BAND N/A 07/02/2013   Procedure: LAPAROSCOPIC REMOVAL OF GASTRIC BAND;  Surgeon: Donnice KATHEE Lunger, MD;  Location: [facility];  Service: General;  Laterality: N/A;   LAPAROSCOPIC REVISION OF GASTRIC BAND  07/03/2012   Procedure: LAPAROSCOPIC REVISION OF GASTRIC BAND;  Surgeon: Donnice KATHEE Lunger, MD;  Location: [facility];  Service: General;  Laterality: N/A;  removal of old lap. band port, replaced with AP standard band   LAPAROSCOPY  09/24/2011   Procedure: LAPAROSCOPY DIAGNOSTIC;  Surgeon: Donnice KATHEE Lunger, MD;  Location: [facility];  Service: General;  Laterality: N/A;   LAPAROSCOPY  07/03/2012   Procedure: LAPAROSCOPY DIAGNOSTIC;  Surgeon: Donnice KATHEE Lunger, MD;  Location: [facility];  Service: General;  Laterality: N/A;  Exploratory Laparoscopy    MESH APPLIED TO LAP PORT  07/03/2012   Procedure: MESH APPLIED TO LAP PORT;  Surgeon: Donnice KATHEE Lunger, MD;  Location: [facility];  Service: General;  Laterality: N/A;   NASAL SINUS SURGERY  1995 & 1997   X 2   right knee  1981   ARTHROSCOPY AND ARTHROTOMY   right knee arthroscopy and arthrotomy  12-1979   TONSILLECTOMY  1971   TUBAL LIGATION  1993   WITH C -SECTION    Family History  Problem Relation Age of Onset   Diabetes Mother    Stroke Mother    Breast  cancer Mother    Atrial fibrillation Mother    Hypertension Mother    Cancer Mother 54       breast   Diabetes Father    Stroke Father 34       2nd 6 month apart   Hypertension Father    Heart attack Father 47       stents   Dementia Father    AAA (abdominal aortic aneurysm) Father 55       repair   CAD Father 70   Thyroid  disease Other    Heart disease Other    COPD Other     Social History   Socioeconomic History   Marital status: Married    Spouse name: Jolynda Townley   Number of children: 2   Years of  education: Not on file   Highest education level: Professional school degree (e.g., MD, DDS, DVM, JD)  Occupational History   Occupation: Teacher, adult education: [employer]  Tobacco Use   Smoking status: Never   Smokeless tobacco: Never  Vaping Use   Vaping status: Never Used  Substance and Sexual Activity   Alcohol use: Not Currently   Drug use: Never   Sexual activity: Yes    Birth control/protection: None  Other Topics Concern   Not on file  Social History Narrative   Right handed   Two story home   Drinks caffeine occasionally   Social Drivers of Health   Financial Resource Strain: Low Risk  (04/05/2024)   Overall Financial Resource Strain (CARDIA)    Difficulty of Paying Living Expenses: Not hard at all  Food Insecurity: No Food Insecurity (04/05/2024)   Hunger Vital Sign    Worried About Running Out of Food in the Last Year: Never true    Ran Out of Food in the Last Year: Never true  Transportation Needs: No Transportation Needs (04/05/2024)   PRAPARE - Administrator, Civil Service (Medical): No    Lack of Transportation (Non-Medical): No  Physical Activity: Unknown (04/05/2024)   Exercise Vital Sign    Days of Exercise per Week: Patient declined    Minutes of Exercise per Session: Not on file  Stress: No Stress Concern Present (04/05/2024)   [employer] of Occupational Health - Occupational Stress Questionnaire    Feeling  of Stress: Not at all  Social Connections: Moderately Isolated (04/05/2024)   Social Connection and Isolation Panel    Frequency of Communication with Friends and Family: Three times a week    Frequency of Social Gatherings with Friends and Family: Once a week    Attends Religious Services: Patient declined    Database administrator or Organizations: No    Attends Engineer, structural: Not on file    Marital Status: Married  Catering manager Violence: Not At Risk (09/07/2023)   Received from [facility]   HITS    Over the last 12 months how often did your partner physically hurt you?: Never    Over the last 12 months how often did your partner insult you or talk down to you?: Never    Over the last 12 months how often did your partner threaten you with physical harm?: Never    Over the last 12 months how often did your partner scream or curse at you?: Never    Outpatient Medications Prior to Visit  Medication Sig Dispense Refill   acetaminophen  (TYLENOL ) 650 MG CR tablet Take 650 mg by mouth as needed.     Calcium  Carbonate-Vit D-Min (CALTRATE MINIS PLUS MINERALS) 300-800 MG-UNIT TABS Take 2 mg by mouth 2 (two) times daily.     certolizumab pegol  (CIMZIA , 2 SYRINGE,) 200 MG/ML prefilled syringe Inject 400 mg into the skin every 30 (thirty) days.     Cholecalciferol 50 MCG (2000 UT) TABS Take 2,000 Units by mouth daily.     conjugated estrogens  (PREMARIN ) vaginal cream Place 0.5 Applicatorfuls (1 gram) vaginally 3 (three) times a week. (Patient taking differently: Place 1 g vaginally 2 (two) times a week.) 30 g 5   Cyanocobalamin  3000 MCG SUBL Take 3,000 mcg by mouth daily.     cyclobenzaprine  (FLEXERIL ) 10 MG tablet Take 1 tablet (10 mg total) by mouth at bedtime. (Patient taking differently: Take 10 mg  by mouth at bedtime as needed for muscle spasms.) 90 tablet 1   diclofenac  Sodium (VOLTAREN ) 1 % GEL Apply 4 g topically 4 (four) times daily. (Patient taking differently: Apply 4  g topically 4 (four) times daily as needed (Joint pain).) 400 g 6   DULoxetine  (CYMBALTA ) 30 MG capsule Take 1 capsule (30 mg total) by mouth 2 (two) times daily. 180 capsule 1   Emollient (DERMEND BRUISE FORMULA) CREA Apply 1 application  topically daily as needed (Wound care).     estradiol  (DOTTI ) 0.1 MG/24HR patch Place 1 patch (0.1 mg total) onto the skin 2 (two) times a week. 24 patch 4   furosemide  (LASIX ) 20 MG tablet Take 1 tablet (20 mg total) by mouth every morning. 90 tablet 1   glucose blood test strip Test blood sugar once daily. Dx Code: E11.9 100 each 12   hydrALAZINE  (APRESOLINE ) 25 MG tablet Take 1 tablet (25 mg total) by mouth 2 (two) times daily. Hold if feeling dizzy or SBP<110 mmHg).  Can use additional 25 mg as needed for SBP>150 mmHg 190 tablet 3   hydroxychloroquine  (PLAQUENIL ) 200 MG tablet Take 2 tablets (400 mg total) by mouth daily. (Patient taking differently: Take 200 mg by mouth 2 (two) times daily.) 180 tablet 0   loratadine (CLARITIN) 10 MG tablet Take 10 mg by mouth as needed (Before RA injection).     methocarbamol  (ROBAXIN ) 500 MG tablet Take 1 tablet (500 mg total) by mouth 4 (four) times daily as needed. (Patient taking differently: Take 500 mg by mouth 4 (four) times daily as needed for muscle spasms.) 360 tablet 1   Multiple Vitamins-Minerals (CENTRUM ADULTS) TABS take 2 tablets by oral route  every day with food     mupirocin  ointment (BACTROBAN ) 2 % Apply to toes as needed. (Patient taking differently: Apply 1 Application topically daily as needed (Toe).) 22 g 1   olmesartan  (BENICAR ) 20 MG tablet Take 1 tablet (20 mg total) by mouth daily with supper. 90 tablet 3   ondansetron  (ZOFRAN ) 4 MG tablet Take 1 tablet (4 mg total) by mouth every 8 (eight) hours as needed for nausea or vomiting. 20 tablet 2   pantoprazole  (PROTONIX ) 40 MG tablet Take 1 tablet (40 mg total) by mouth daily. (Patient taking differently: Take 40 mg by mouth at bedtime.) 90 tablet 3    potassium chloride  (KLOR-CON  M) 10 MEQ tablet Take 1 tablet (10 mEq total) by mouth daily. 90 tablet 1   pregabalin  (LYRICA ) 150 MG capsule Take 1 capsule (150 mg total) by mouth 3 (three) times daily. Max daily amount: 3 capsules (450mg ) 270 capsule 0   Rimegepant Sulfate  (NURTEC) 75 MG TBDP Take 1 tablet (75 mg total) by mouth every other day. 16 tablet 11   rosuvastatin  (CRESTOR ) 20 MG tablet Take 1 tablet (20 mg total) by mouth daily. (Patient taking differently: Take 20 mg by mouth at bedtime.) 90 tablet 1   temazepam  (RESTORIL ) 30 MG capsule Take 1 capsule (30 mg total) by mouth at bedtime as needed for sleep. 90 capsule 1   thiamine (VITAMIN B-1) 100 MG tablet Take 100 mg daily by mouth.     tirzepatide  (MOUNJARO ) 15 MG/0.5ML Pen Inject 15 mg into the skin once a week. 6 mL 3   traMADol  (ULTRAM ) 50 MG tablet Take 1 tablet (50 mg total) by mouth 3 (three) times daily as needed. Max Daily Amount: 150 mg 90 tablet 1   triamcinolone  ointment (KENALOG ) 0.1 %  APPLY A THIN LAYER TO THE AFFECTED AREA(S) BY TOPICAL ROUTE 2 TIMES PER DAY 30 g 2   Ubrogepant  (UBRELVY ) 100 MG TABS TAKE ONE TABLET BY MOUTH DAILY AS NEEDED FOR MIGRAINE. MAX 2 TABLETS IN 24 HOURS. 16 tablet 5   No facility-administered medications prior to visit.    Allergies  Allergen Reactions   Gabapentin  Swelling    Pt states she has eye swelling    Isoniazid  Shortness Of Breath   Morphine  Hives and Nausea And Vomiting    Allergic to PCA pump only    Nitrofurantoin Other (See Comments), Rash, Shortness Of Breath and Dermatitis    REACTION: welts   Tamiflu  [Oseltamivir  Phosphate] Nausea And Vomiting    I vomited within 30 minutes of taking it and was told I cannot ever take it again.   Hctz [Hydrochlorothiazide] Rash   Macrolides And Ketolides Rash   Promethazine  Other (See Comments)    Other Reaction(s): Psychosis  IF GIVEN  IV-hallucinations     CAN TAKE PO PHENERGAN , , Other reaction(s): Hallucinations, IF GIVEN   IV-hallucinations     CAN TAKE PO PHENERGAN , Other reaction(s): Unknown   Wound Dressing Adhesive Rash    Adhesive Tape    Fentanyl  Hives, Itching and Rash    REACTION: welts   Sulfamethoxazole-Trimethoprim Rash    Review of Systems  Constitutional:  Negative for fever and malaise/fatigue.  HENT:  Negative for congestion.   Eyes:  Negative for blurred vision.  Respiratory:  Negative for cough and shortness of breath.   Cardiovascular:  Negative for chest pain, palpitations and leg swelling.  Gastrointestinal:  Negative for vomiting.  Musculoskeletal:  Negative for back pain.  Skin:  Negative for rash.       Wound-- R buttock ----   1 inch width---+ serous  drainage  culture done  Surrounding erythema  Tender to touch  Neurological:  Negative for loss of consciousness and headaches.       Objective:    Physical Exam Vitals and nursing note reviewed.  Constitutional:      General: She is not in acute distress.    Appearance: Normal appearance. She is well-developed.  HENT:     Head: Normocephalic and atraumatic.  Eyes:     General: No scleral icterus.       Right eye: No discharge.        Left eye: No discharge.  Cardiovascular:     Rate and Rhythm: Normal rate and regular rhythm.     Heart sounds: No murmur heard. Pulmonary:     Effort: Pulmonary effort is normal. No respiratory distress.     Breath sounds: Normal breath sounds.  Musculoskeletal:        General: Normal range of motion.     Cervical back: Normal range of motion and neck supple.     Right lower leg: No edema.     Left lower leg: No edema.  Skin:    General: Skin is warm and dry.     Comments: Wound-- R buttock ----   1 inch width---+ serous  drainage  culture done  Surrounding erythema  Tender to touch   Neurological:     Mental Status: She is alert and oriented to person, place, and time.  Psychiatric:        Mood and Affect: Mood normal.        Behavior: Behavior normal.        Thought  Content: Thought content normal.  Judgment: Judgment normal.     BP 100/60 (BP Location: Left Arm, Patient Position: Sitting, Cuff Size: Normal)   Pulse 74   Temp 98.3 F (36.8 C) (Oral)   Resp 18   Ht 5' 9 (1.753 m)   Wt 168 lb 6.4 oz (76.4 kg)   SpO2 99%   BMI 24.87 kg/m  Wt Readings from Last 3 Encounters:  04/06/24 168 lb 6.4 oz (76.4 kg)  03/21/24 167 lb 8.8 oz (76 kg)  03/16/24 168 lb (76.2 kg)    Diabetic Foot Exam - Simple   No data filed    Lab Results  Component Value Date   WBC 4.5 03/21/2024   HGB 12.2 03/21/2024   HCT 38.4 03/21/2024   PLT 146 (L) 03/21/2024   GLUCOSE 91 03/21/2024   CHOL 105 10/21/2023   TRIG 59.0 10/21/2023   HDL 60.70 10/21/2023   LDLCALC 33 10/21/2023   ALT 76 (H) 03/14/2024   AST 64 (H) 03/14/2024   NA 142 03/21/2024   K 3.7 03/21/2024   CL 106 03/21/2024   CREATININE 0.71 03/21/2024   BUN 8 03/21/2024   CO2 26 03/21/2024   TSH 1.66 10/21/2023   HGBA1C 5.5 10/21/2023   MICROALBUR 0.9 10/21/2023    Lab Results  Component Value Date   TSH 1.66 10/21/2023   Lab Results  Component Value Date   WBC 4.5 03/21/2024   HGB 12.2 03/21/2024   HCT 38.4 03/21/2024   MCV 93.2 03/21/2024   PLT 146 (L) 03/21/2024   Lab Results  Component Value Date   NA 142 03/21/2024   K 3.7 03/21/2024   CO2 26 03/21/2024   GLUCOSE 91 03/21/2024   BUN 8 03/21/2024   CREATININE 0.71 03/21/2024   BILITOT 0.8 03/14/2024   ALKPHOS 83 03/14/2024   AST 64 (H) 03/14/2024   ALT 76 (H) 03/14/2024   PROT 6.8 03/14/2024   ALBUMIN 4.0 03/14/2024   CALCIUM  9.0 03/21/2024   ANIONGAP 10 03/21/2024   EGFR 80.0 01/11/2024   GFR 87.20 04/22/2023   Lab Results  Component Value Date   CHOL 105 10/21/2023   Lab Results  Component Value Date   HDL 60.70 10/21/2023   Lab Results  Component Value Date   LDLCALC 33 10/21/2023   Lab Results  Component Value Date   TRIG 59.0 10/21/2023   Lab Results  Component Value Date   CHOLHDL 2  10/21/2023   Lab Results  Component Value Date   HGBA1C 5.5 10/21/2023       Assessment & Plan:  Postoperative wound infection of right hip -     WOUND CULTURE -     Doxycycline  Hyclate; Take 1 tablet (100 mg total) by mouth 2 (two) times daily.  Dispense: 20 tablet; Refill: 0  Assessment and Plan Assessment & Plan Postoperative right hip wound drainage   Postoperative wound drainage from the right hip following cyst removal surgery on March 21, 2024, is significant, soaking through pads and 4x4s. The drainage is bloody, and the wound appears open with visible layers. She reports chills and intermittent fever, suggesting a possible infection despite the wound not appearing red or overtly infected. The wound is not healing as expected, raising concern for sepsis if not managed properly. The wound will remain open to allow drainage and prevent bacterial growth. Prescribe doxycycline  to cover potential infection. Obtain a wound culture to identify any bacterial infection. Avoid sealing the wound with Dermabond until infection is ruled out. Use  SteriStrips to manage the wound without sealing it completely. Schedule a follow-up appointment with the surgeon on April 11, 2024, to reassess the wound.    Cuong Moorman R Lowne Chase, DO

## 2024-04-09 ENCOUNTER — Ambulatory Visit: Payer: Self-pay | Admitting: Family Medicine

## 2024-04-09 LAB — WOUND CULTURE
MICRO NUMBER:: 17022621
SPECIMEN QUALITY:: ADEQUATE

## 2024-04-10 ENCOUNTER — Encounter: Payer: Self-pay | Admitting: Family Medicine

## 2024-04-12 ENCOUNTER — Encounter: Payer: Self-pay | Admitting: Family Medicine

## 2024-04-17 ENCOUNTER — Ambulatory Visit: Admitting: Family Medicine

## 2024-04-17 ENCOUNTER — Encounter: Payer: Self-pay | Admitting: Family

## 2024-04-17 ENCOUNTER — Encounter: Payer: Self-pay | Admitting: Family Medicine

## 2024-04-17 ENCOUNTER — Other Ambulatory Visit (HOSPITAL_BASED_OUTPATIENT_CLINIC_OR_DEPARTMENT_OTHER): Payer: Self-pay

## 2024-04-17 VITALS — BP 112/60 | HR 73 | Temp 98.5°F | Resp 18 | Ht 69.0 in | Wt 169.8 lb

## 2024-04-17 DIAGNOSIS — E559 Vitamin D deficiency, unspecified: Secondary | ICD-10-CM | POA: Diagnosis not present

## 2024-04-17 DIAGNOSIS — E118 Type 2 diabetes mellitus with unspecified complications: Secondary | ICD-10-CM

## 2024-04-17 DIAGNOSIS — E1140 Type 2 diabetes mellitus with diabetic neuropathy, unspecified: Secondary | ICD-10-CM | POA: Diagnosis not present

## 2024-04-17 DIAGNOSIS — G4709 Other insomnia: Secondary | ICD-10-CM

## 2024-04-17 DIAGNOSIS — E538 Deficiency of other specified B group vitamins: Secondary | ICD-10-CM

## 2024-04-17 DIAGNOSIS — K219 Gastro-esophageal reflux disease without esophagitis: Secondary | ICD-10-CM

## 2024-04-17 DIAGNOSIS — I10 Essential (primary) hypertension: Secondary | ICD-10-CM | POA: Diagnosis not present

## 2024-04-17 DIAGNOSIS — E1169 Type 2 diabetes mellitus with other specified complication: Secondary | ICD-10-CM | POA: Diagnosis not present

## 2024-04-17 DIAGNOSIS — F418 Other specified anxiety disorders: Secondary | ICD-10-CM

## 2024-04-17 DIAGNOSIS — R110 Nausea: Secondary | ICD-10-CM | POA: Diagnosis not present

## 2024-04-17 DIAGNOSIS — Z7985 Long-term (current) use of injectable non-insulin antidiabetic drugs: Secondary | ICD-10-CM

## 2024-04-17 DIAGNOSIS — M0579 Rheumatoid arthritis with rheumatoid factor of multiple sites without organ or systems involvement: Secondary | ICD-10-CM

## 2024-04-17 DIAGNOSIS — E785 Hyperlipidemia, unspecified: Secondary | ICD-10-CM | POA: Diagnosis not present

## 2024-04-17 LAB — CBC WITH DIFFERENTIAL/PLATELET
Basophils Absolute: 0 K/uL (ref 0.0–0.1)
Basophils Relative: 1 % (ref 0.0–3.0)
Eosinophils Absolute: 0.1 K/uL (ref 0.0–0.7)
Eosinophils Relative: 1.3 % (ref 0.0–5.0)
HCT: 38.7 % (ref 36.0–46.0)
Hemoglobin: 12.7 g/dL (ref 12.0–15.0)
Lymphocytes Relative: 31.5 % (ref 12.0–46.0)
Lymphs Abs: 1.5 K/uL (ref 0.7–4.0)
MCHC: 32.7 g/dL (ref 30.0–36.0)
MCV: 88.2 fl (ref 78.0–100.0)
Monocytes Absolute: 0.5 K/uL (ref 0.1–1.0)
Monocytes Relative: 10.4 % (ref 3.0–12.0)
Neutro Abs: 2.7 K/uL (ref 1.4–7.7)
Neutrophils Relative %: 55.8 % (ref 43.0–77.0)
Platelets: 192 K/uL (ref 150.0–400.0)
RBC: 4.38 Mil/uL (ref 3.87–5.11)
RDW: 13.1 % (ref 11.5–15.5)
WBC: 4.8 K/uL (ref 4.0–10.5)

## 2024-04-17 LAB — COMPREHENSIVE METABOLIC PANEL WITH GFR
ALT: 34 U/L (ref 0–35)
AST: 36 U/L (ref 0–37)
Albumin: 3.8 g/dL (ref 3.5–5.2)
Alkaline Phosphatase: 85 U/L (ref 39–117)
BUN: 8 mg/dL (ref 6–23)
CO2: 34 meq/L — ABNORMAL HIGH (ref 19–32)
Calcium: 9.4 mg/dL (ref 8.4–10.5)
Chloride: 102 meq/L (ref 96–112)
Creatinine, Ser: 0.69 mg/dL (ref 0.40–1.20)
GFR: 89.89 mL/min (ref >60.00)
Glucose, Bld: 87 mg/dL (ref 70–99)
Potassium: 4.2 meq/L (ref 3.5–5.1)
Sodium: 143 meq/L (ref 135–145)
Total Bilirubin: 0.8 mg/dL (ref 0.2–1.2)
Total Protein: 6.6 g/dL (ref 6.0–8.3)

## 2024-04-17 LAB — LIPID PANEL
Cholesterol: 111 mg/dL (ref 0–200)
HDL: 61.2 mg/dL (ref >39.00)
LDL Cholesterol: 38 mg/dL (ref 0–99)
NonHDL: 49.72
Total CHOL/HDL Ratio: 2
Triglycerides: 59 mg/dL (ref 0.0–149.0)
VLDL: 11.8 mg/dL (ref 0.0–40.0)

## 2024-04-17 LAB — VITAMIN D 25 HYDROXY (VIT D DEFICIENCY, FRACTURES): VITD: 32.33 ng/mL (ref 30.00–100.00)

## 2024-04-17 LAB — VITAMIN B12: Vitamin B-12: 377 pg/mL (ref 211–911)

## 2024-04-17 LAB — HEMOGLOBIN A1C: Hgb A1c MFr Bld: 5.4 % (ref 4.6–6.5)

## 2024-04-17 LAB — TSH: TSH: 2.28 u[IU]/mL (ref 0.35–5.50)

## 2024-04-17 MED ORDER — ONDANSETRON HCL 4 MG PO TABS
4.0000 mg | ORAL_TABLET | Freq: Three times a day (TID) | ORAL | 2 refills | Status: AC | PRN
  Filled 2024-04-17: qty 20, 7d supply, fill #0

## 2024-04-17 MED ORDER — TEMAZEPAM 30 MG PO CAPS
30.0000 mg | ORAL_CAPSULE | Freq: Every evening | ORAL | 1 refills | Status: AC | PRN
  Filled 2024-04-17: qty 90, 90d supply, fill #0

## 2024-04-17 MED ORDER — DULOXETINE HCL 30 MG PO CPEP
30.0000 mg | ORAL_CAPSULE | Freq: Two times a day (BID) | ORAL | 1 refills | Status: AC
  Filled 2024-04-17 – 2024-07-01 (×2): qty 180, 90d supply, fill #0

## 2024-04-17 MED ORDER — OLMESARTAN MEDOXOMIL 20 MG PO TABS
20.0000 mg | ORAL_TABLET | Freq: Every day | ORAL | 3 refills | Status: AC
  Filled 2024-04-17 – 2024-06-11 (×2): qty 90, 90d supply, fill #0

## 2024-04-17 MED ORDER — TIRZEPATIDE 15 MG/0.5ML ~~LOC~~ SOAJ
15.0000 mg | SUBCUTANEOUS | 3 refills | Status: AC
  Filled 2024-04-17 – 2024-04-18 (×2): qty 6, 84d supply, fill #0
  Filled 2024-07-20: qty 6, 84d supply, fill #1

## 2024-04-17 MED ORDER — DICLOFENAC SODIUM 1 % EX GEL
4.0000 g | Freq: Four times a day (QID) | CUTANEOUS | 6 refills | Status: AC
  Filled 2024-04-17: qty 400, 56d supply, fill #0

## 2024-04-17 MED ORDER — PANTOPRAZOLE SODIUM 40 MG PO TBEC
40.0000 mg | DELAYED_RELEASE_TABLET | Freq: Every day | ORAL | 3 refills | Status: AC
  Filled 2024-04-17 – 2024-07-01 (×2): qty 90, 90d supply, fill #0

## 2024-04-17 NOTE — Assessment & Plan Note (Signed)
 Well controlled, no changes to meds. Encouraged heart healthy diet such as the DASH diet and exercise as tolerated.

## 2024-04-17 NOTE — Assessment & Plan Note (Signed)
 [id number] to be checked, minimize simple carbs. Increase exercise as tolerated. Continue current meds

## 2024-04-17 NOTE — Assessment & Plan Note (Signed)
 stable

## 2024-04-17 NOTE — Progress Notes (Signed)
 Subjective:    Patient ID: [name], female    DOB: 05/07/1956, 68 y.o.   MRN: [id number]  Chief Complaint  Patient presents with   Hypertension   Hyperlipidemia   Follow-up    HPI Patient is in today for f/u bp and chol.  Pt saw surgeon for cyst removal.  Discussed the use of AI scribe software for clinical note transcription with the patient, who gave verbal consent to proceed.  History of Present Illness [name] is a 68 year old female who presents with a sore postoperative wound following excision of a benign fibroadipose tissue.  She experiences significant soreness at the site of the postoperative wound, which was initially treated with antibiotics. A wound culture returned positive for staphylococcal infection. The patient reports that the wound is open, the sutures are gone, and her husband is packing the wound daily. She describes the wound as 'raining all the time,' causing discomfort and inconvenience, especially after showering.  She manages the drainage using mesh panties and sanitary pads. She reports feeling a hardened area of skin near the wound. She is frustrated with the healing process and questions whether the sutures were dissolvable.  She is currently taking doxycycline  as prescribed, with one day remaining in the course. She inquires about a refill for Nugalt. The wound packing is currently about three inches deep.  The initial surgical procedure was for the removal of a benign fibroadipose tissue, identified as a lipoma. She expresses concern about the implications of the pathology report, which mentioned potential muscle problems.  She discusses her insurance and Medicare concerns, including issues with mail-order prescriptions and coverage under her husband's insurance plan. She is also dealing with confusion regarding her eligibility for certain insurance benefits.    Past Medical History:  Diagnosis Date   Allergic rhinitis     Anxiety    Asthma    hx bronchial asthma at times with upper resp infection   Cancer (HCC)    basal cell   Depression    Fibromyalgia    GERD (gastroesophageal reflux disease)    Headache(784.0)    migraine and cluster headaches   Heart murmur    Hyperlipemia    Hypertension    IBS (irritable bowel syndrome)    Menopause    Neuromuscular disorder (HCC) 2009   hx of fibromyalgia, polyarthralsia (surgery induced)   Osteoarthritis    Purpura    Rheumatoid arthritis (HCC)    Complicated by osteoarthritis as well.   Rheumatoid arthritis (HCC)    Stress incontinence    at times   Tibial fracture 10/14/2016   evulsion periostial right   Type 2 diabetes mellitus with complication, without long-term current use of insulin  (HCC)    diet controlled no meds -> however was diagnosed with a diabetic foot ulcer.    Past Surgical History:  Procedure Laterality Date   ABDOMINAL HYSTERECTOMY  12/1999   complete   CARPAL TUNNEL RELEASE Right 12/27/2017   Procedure: CARPAL TUNNEL RELEASE;  Surgeon: Camella Fallow, MD;  Location: [facility];  Service: Orthopedics;  Laterality: Right;   CERVICAL CONIZATION W/BX  june 1990   dysplasia of cervix/used 5Fu cream for 3 months   CERVICAL LAMINECTOMY  2005 & 2009   X 2   NO ROM PROBLEMS   CESAREAN SECTION  1989 & 1993   X 2   CHOLECYSTECTOMY  1986   CYST REMOVAL LEG Right 03/21/2024   Procedure: EXCISION MASS  ON RIGHT HIP;  Surgeon: Kinsinger, Herlene Righter, MD;  Location: [facility];  Service: General;  Laterality: Right;   DILATION AND CURETTAGE OF UTERUS   april-1989   missed abortion   ESOPHAGOGASTRODUODENOSCOPY  06/29/2011   Procedure: ESOPHAGOGASTRODUODENOSCOPY (EGD);  Surgeon: Alm VEAR Angle, MD;  Location: [facility];  Service: General;  Laterality: N/A;   FOOT SURGERY  2005   RT HEEL   GASTRIC BANDING PORT REVISION  09/24/2011   Procedure: GASTRIC BANDING PORT REVISION;  Surgeon: Donnice KATHEE Lunger, MD;  Location: [facility];  Service: General;   Laterality: N/A;   GASTRIC ROUX-EN-Y N/A 06/06/2017   Procedure: 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;  Surgeon: Lunger Donnice, MD;  Location: [facility];  Service: General;  Laterality: N/A;   HYSTEROSCOPY  1999   LAPAROSCOPIC GASTRIC BANDING  12/30/09   LAPAROSCOPIC GASTRIC SLEEVE RESECTION N/A 07/02/2013   Procedure: LAPAROSCOPIC GASTRIC SLEEVE RESECTION upper endoscopy;  Surgeon: Donnice KATHEE Lunger, MD;  Location: [facility];  Service: General;  Laterality: N/A;   LAPAROSCOPIC REPAIR AND REMOVAL OF GASTRIC BAND N/A 07/02/2013   Procedure: LAPAROSCOPIC REMOVAL OF GASTRIC BAND;  Surgeon: Donnice KATHEE Lunger, MD;  Location: [facility];  Service: General;  Laterality: N/A;   LAPAROSCOPIC REVISION OF GASTRIC BAND  07/03/2012   Procedure: LAPAROSCOPIC REVISION OF GASTRIC BAND;  Surgeon: Donnice KATHEE Lunger, MD;  Location: [facility];  Service: General;  Laterality: N/A;  removal of old lap. band port, replaced with AP standard band   LAPAROSCOPY  09/24/2011   Procedure: LAPAROSCOPY DIAGNOSTIC;  Surgeon: Donnice KATHEE Lunger, MD;  Location: [facility];  Service: General;  Laterality: N/A;   LAPAROSCOPY  07/03/2012   Procedure: LAPAROSCOPY DIAGNOSTIC;  Surgeon: Donnice KATHEE Lunger, MD;  Location: [facility];  Service: General;  Laterality: N/A;  Exploratory Laparoscopy    MESH APPLIED TO LAP PORT  07/03/2012   Procedure: MESH APPLIED TO LAP PORT;  Surgeon: Donnice KATHEE Lunger, MD;  Location: [facility];  Service: General;  Laterality: N/A;   NASAL SINUS SURGERY  1995 & 1997   X 2   right knee  1981   ARTHROSCOPY AND ARTHROTOMY   right knee arthroscopy and arthrotomy  12-1979   TONSILLECTOMY  1971   TUBAL LIGATION  1993   WITH C -SECTION    Family History  Problem Relation Age of Onset   Diabetes Mother    Stroke Mother    Breast cancer Mother    Atrial fibrillation Mother    Hypertension Mother    Cancer Mother 20       breast   Diabetes Father    Stroke Father 63       2nd 6 month  apart   Hypertension Father    Heart attack Father 50       stents   Dementia Father    AAA (abdominal aortic aneurysm) Father 43       repair   CAD Father 87   Thyroid  disease Other    Heart disease Other    COPD Other     Social History   Socioeconomic History   Marital status: Married    Spouse name: Evanee Lubrano   Number of children: 2   Years of education: Not on file   Highest education level: Professional school degree (e.g., MD, DDS, DVM, JD)  Occupational History   Occupation: RN    Employer: [employer]  Tobacco Use   Smoking status:  Never   Smokeless tobacco: Never  Vaping Use   Vaping status: Never Used  Substance and Sexual Activity   Alcohol use: Not Currently   Drug use: Never   Sexual activity: Yes    Birth control/protection: None  Other Topics Concern   Not on file  Social History Narrative   Right handed   Two story home   Drinks caffeine occasionally   Social Drivers of Health   Financial Resource Strain: Low Risk  (04/05/2024)   Overall Financial Resource Strain (CARDIA)    Difficulty of Paying Living Expenses: Not hard at all  Food Insecurity: No Food Insecurity (04/05/2024)   Hunger Vital Sign    Worried About Running Out of Food in the Last Year: Never true    Ran Out of Food in the Last Year: Never true  Transportation Needs: No Transportation Needs (04/05/2024)   PRAPARE - Administrator, Civil Service (Medical): No    Lack of Transportation (Non-Medical): No  Physical Activity: Unknown (04/05/2024)   Exercise Vital Sign    Days of Exercise per Week: Patient declined    Minutes of Exercise per Session: Not on file  Stress: No Stress Concern Present (04/05/2024)   [employer] of Occupational Health - Occupational Stress Questionnaire    Feeling of Stress: Not at all  Social Connections: Moderately Isolated (04/05/2024)   Social Connection and Isolation Panel    Frequency of Communication with Friends  and Family: Three times a week    Frequency of Social Gatherings with Friends and Family: Once a week    Attends Religious Services: Patient declined    Database administrator or Organizations: No    Attends Engineer, structural: Not on file    Marital Status: Married  Catering manager Violence: Not At Risk (09/07/2023)   Received from [facility]   HITS    Over the last 12 months how often did your partner physically hurt you?: Never    Over the last 12 months how often did your partner insult you or talk down to you?: Never    Over the last 12 months how often did your partner threaten you with physical harm?: Never    Over the last 12 months how often did your partner scream or curse at you?: Never    Outpatient Medications Prior to Visit  Medication Sig Dispense Refill   acetaminophen  (TYLENOL ) 650 MG CR tablet Take 650 mg by mouth as needed.     Calcium  Carbonate-Vit D-Min (CALTRATE MINIS PLUS MINERALS) 300-800 MG-UNIT TABS Take 2 mg by mouth 2 (two) times daily.     certolizumab pegol  (CIMZIA , 2 SYRINGE,) 200 MG/ML prefilled syringe Inject 400 mg into the skin every 30 (thirty) days.     Cholecalciferol 50 MCG (2000 UT) TABS Take 2,000 Units by mouth daily.     conjugated estrogens  (PREMARIN ) vaginal cream Place 0.5 Applicatorfuls (1 gram) vaginally 3 (three) times a week. (Patient taking differently: Place 1 g vaginally 2 (two) times a week.) 30 g 5   Cyanocobalamin  3000 MCG SUBL Take 3,000 mcg by mouth daily.     cyclobenzaprine  (FLEXERIL ) 10 MG tablet Take 1 tablet (10 mg total) by mouth at bedtime. (Patient taking differently: Take 10 mg by mouth at bedtime as needed for muscle spasms.) 90 tablet 1   doxycycline  (VIBRA -TABS) 100 MG tablet Take 1 tablet (100 mg total) by mouth 2 (two) times daily. 20 tablet 0   Emollient (DERMEND BRUISE  FORMULA) CREA Apply 1 application  topically daily as needed (Wound care).     estradiol  (DOTTI ) 0.1 MG/24HR patch Place 1 patch (0.1 mg  total) onto the skin 2 (two) times a week. 24 patch 4   furosemide  (LASIX ) 20 MG tablet Take 1 tablet (20 mg total) by mouth every morning. 90 tablet 1   glucose blood test strip Test blood sugar once daily. Dx Code: E11.9 100 each 12   hydrALAZINE  (APRESOLINE ) 25 MG tablet Take 1 tablet (25 mg total) by mouth 2 (two) times daily. Hold if feeling dizzy or SBP<110 mmHg).  Can use additional 25 mg as needed for SBP>150 mmHg 190 tablet 3   hydroxychloroquine  (PLAQUENIL ) 200 MG tablet Take 2 tablets (400 mg total) by mouth daily. (Patient taking differently: Take 200 mg by mouth 2 (two) times daily.) 180 tablet 0   loratadine (CLARITIN) 10 MG tablet Take 10 mg by mouth as needed (Before RA injection).     methocarbamol  (ROBAXIN ) 500 MG tablet Take 1 tablet (500 mg total) by mouth 4 (four) times daily as needed. (Patient taking differently: Take 500 mg by mouth 4 (four) times daily as needed for muscle spasms.) 360 tablet 1   Multiple Vitamins-Minerals (CENTRUM ADULTS) TABS take 2 tablets by oral route  every day with food     mupirocin  ointment (BACTROBAN ) 2 % Apply to toes as needed. (Patient taking differently: Apply 1 Application topically daily as needed (Toe).) 22 g 1   potassium chloride  (KLOR-CON  M) 10 MEQ tablet Take 1 tablet (10 mEq total) by mouth daily. 90 tablet 1   pregabalin  (LYRICA ) 150 MG capsule Take 1 capsule (150 mg total) by mouth 3 (three) times daily. Max daily amount: 3 capsules (450mg ) 270 capsule 0   Rimegepant Sulfate  (NURTEC) 75 MG TBDP Take 1 tablet (75 mg total) by mouth every other day. 16 tablet 11   rosuvastatin  (CRESTOR ) 20 MG tablet Take 1 tablet (20 mg total) by mouth daily. (Patient taking differently: Take 20 mg by mouth at bedtime.) 90 tablet 1   thiamine (VITAMIN B-1) 100 MG tablet Take 100 mg daily by mouth.     traMADol  (ULTRAM ) 50 MG tablet Take 1 tablet (50 mg total) by mouth 3 (three) times daily as needed. Max Daily Amount: 150 mg 90 tablet 1   triamcinolone   ointment (KENALOG ) 0.1 % APPLY A THIN LAYER TO THE AFFECTED AREA(S) BY TOPICAL ROUTE 2 TIMES PER DAY 30 g 2   Ubrogepant  (UBRELVY ) 100 MG TABS TAKE ONE TABLET BY MOUTH DAILY AS NEEDED FOR MIGRAINE. MAX 2 TABLETS IN 24 HOURS. 16 tablet 5   diclofenac  Sodium (VOLTAREN ) 1 % GEL Apply 4 g topically 4 (four) times daily. (Patient taking differently: Apply 4 g topically 4 (four) times daily as needed (Joint pain).) 400 g 6   DULoxetine  (CYMBALTA ) 30 MG capsule Take 1 capsule (30 mg total) by mouth 2 (two) times daily. 180 capsule 1   olmesartan  (BENICAR ) 20 MG tablet Take 1 tablet (20 mg total) by mouth daily with supper. 90 tablet 3   ondansetron  (ZOFRAN ) 4 MG tablet Take 1 tablet (4 mg total) by mouth every 8 (eight) hours as needed for nausea or vomiting. 20 tablet 2   pantoprazole  (PROTONIX ) 40 MG tablet Take 1 tablet (40 mg total) by mouth daily. (Patient taking differently: Take 40 mg by mouth at bedtime.) 90 tablet 3   temazepam  (RESTORIL ) 30 MG capsule Take 1 capsule (30 mg total) by mouth at  bedtime as needed for sleep. 90 capsule 1   tirzepatide  (MOUNJARO ) 15 MG/0.5ML Pen Inject 15 mg into the skin once a week. 6 mL 3   No facility-administered medications prior to visit.    Allergies  Allergen Reactions   Gabapentin  Swelling    Pt states she has eye swelling    Isoniazid  Shortness Of Breath   Morphine  Hives and Nausea And Vomiting    Allergic to PCA pump only    Nitrofurantoin Other (See Comments), Rash, Shortness Of Breath and Dermatitis    REACTION: welts   Tamiflu  [Oseltamivir  Phosphate] Nausea And Vomiting    I vomited within 30 minutes of taking it and was told I cannot ever take it again.   Hctz [Hydrochlorothiazide] Rash   Macrolides And Ketolides Rash   Promethazine  Other (See Comments)    Other Reaction(s): Psychosis  IF GIVEN  IV-hallucinations     CAN TAKE PO PHENERGAN , , Other reaction(s): Hallucinations, IF GIVEN  IV-hallucinations     CAN TAKE PO PHENERGAN , Other  reaction(s): Unknown   Wound Dressing Adhesive Rash    Adhesive Tape    Fentanyl  Hives, Itching and Rash    REACTION: welts   Sulfamethoxazole-Trimethoprim Rash    Review of Systems  Constitutional:  Negative for fever and malaise/fatigue.  HENT:  Negative for congestion.   Eyes:  Negative for blurred vision.  Respiratory:  Negative for shortness of breath.   Cardiovascular:  Negative for chest pain, palpitations and leg swelling.  Gastrointestinal:  Negative for abdominal pain, blood in stool and nausea.  Genitourinary:  Negative for dysuria and frequency.  Musculoskeletal:  Negative for falls.  Skin:  Negative for rash.  Neurological:  Negative for dizziness, loss of consciousness and headaches.  Endo/Heme/Allergies:  Negative for environmental allergies.  Psychiatric/Behavioral:  Negative for depression. The patient is not nervous/anxious.        Objective:    Physical Exam Vitals and nursing note reviewed.  Constitutional:      General: She is not in acute distress.    Appearance: Normal appearance. She is well-developed.  HENT:     Head: Normocephalic and atraumatic.  Eyes:     General: No scleral icterus.       Right eye: No discharge.        Left eye: No discharge.  Cardiovascular:     Rate and Rhythm: Normal rate and regular rhythm.     Heart sounds: No murmur heard. Pulmonary:     Effort: Pulmonary effort is normal. No respiratory distress.     Breath sounds: Normal breath sounds.  Musculoskeletal:        General: Normal range of motion.     Cervical back: Normal range of motion and neck supple.     Right lower leg: No edema.     Left lower leg: No edema.  Skin:    General: Skin is warm and dry.  Neurological:     Mental Status: She is alert and oriented to person, place, and time.  Psychiatric:        Mood and Affect: Mood normal.        Behavior: Behavior normal.        Thought Content: Thought content normal.        Judgment: Judgment normal.      BP 112/60 (BP Location: Right Arm, Patient Position: Sitting, Cuff Size: Normal)   Pulse 73   Temp 98.5 F (36.9 C) (Oral)   Resp 18   Ht  5' 9 (1.753 m)   Wt 169 lb 12.8 oz (77 kg)   SpO2 98%   BMI 25.08 kg/m  Wt Readings from Last 3 Encounters:  04/17/24 169 lb 12.8 oz (77 kg)  04/06/24 168 lb 6.4 oz (76.4 kg)  03/21/24 167 lb 8.8 oz (76 kg)    Diabetic Foot Exam - Simple   No data filed    Lab Results  Component Value Date   WBC 4.5 03/21/2024   HGB 12.2 03/21/2024   HCT 38.4 03/21/2024   PLT 146 (L) 03/21/2024   GLUCOSE 91 03/21/2024   CHOL 105 10/21/2023   TRIG 59.0 10/21/2023   HDL 60.70 10/21/2023   LDLCALC 33 10/21/2023   ALT 76 (H) 03/14/2024   AST 64 (H) 03/14/2024   NA 142 03/21/2024   K 3.7 03/21/2024   CL 106 03/21/2024   CREATININE 0.71 03/21/2024   BUN 8 03/21/2024   CO2 26 03/21/2024   TSH 1.66 10/21/2023   HGBA1C 5.5 10/21/2023   MICROALBUR 0.9 10/21/2023    Lab Results  Component Value Date   TSH 1.66 10/21/2023   Lab Results  Component Value Date   WBC 4.5 03/21/2024   HGB 12.2 03/21/2024   HCT 38.4 03/21/2024   MCV 93.2 03/21/2024   PLT 146 (L) 03/21/2024   Lab Results  Component Value Date   NA 142 03/21/2024   K 3.7 03/21/2024   CO2 26 03/21/2024   GLUCOSE 91 03/21/2024   BUN 8 03/21/2024   CREATININE 0.71 03/21/2024   BILITOT 0.8 03/14/2024   ALKPHOS 83 03/14/2024   AST 64 (H) 03/14/2024   ALT 76 (H) 03/14/2024   PROT 6.8 03/14/2024   ALBUMIN 4.0 03/14/2024   CALCIUM  9.0 03/21/2024   ANIONGAP 10 03/21/2024   EGFR 80.0 01/11/2024   GFR 87.20 04/22/2023   Lab Results  Component Value Date   CHOL 105 10/21/2023   Lab Results  Component Value Date   HDL 60.70 10/21/2023   Lab Results  Component Value Date   LDLCALC 33 10/21/2023   Lab Results  Component Value Date   TRIG 59.0 10/21/2023   Lab Results  Component Value Date   CHOLHDL 2 10/21/2023   Lab Results  Component Value Date   HGBA1C 5.5  10/21/2023       Assessment & Plan:  Essential hypertension -     CBC with Differential/Platelet -     Comprehensive metabolic panel with GFR -     TSH  Hyperlipidemia, unspecified hyperlipidemia type -     Comprehensive metabolic panel with GFR -     Lipid panel  Controlled type 2 diabetes mellitus with complication, without long-term current use of insulin  (HCC) -     Comprehensive metabolic panel with GFR -     Hemoglobin A1c -     Tirzepatide ; Inject 15 mg into the skin once a week.  Dispense: 6 mL; Refill: 3  Vitamin D  deficiency -     VITAMIN D  25 Hydroxy (Vit-D Deficiency, Fractures)  Vitamin B12 deficiency -     Vitamin B12  Depression with anxiety Assessment & Plan: stable  Orders: -     DULoxetine  HCl; Take 1 capsule (30 mg total) by mouth 2 (two) times daily.  Dispense: 180 capsule; Refill: 1  Nausea -     Ondansetron  HCl; Take 1 tablet (4 mg total) by mouth every 8 (eight) hours as needed for nausea or vomiting.  Dispense: 20 tablet; Refill:  2  Gastroesophageal reflux disease, unspecified whether esophagitis present -     Pantoprazole  Sodium; Take 1 tablet (40 mg total) by mouth daily.  Dispense: 90 tablet; Refill: 3  Other insomnia -     Temazepam ; Take 1 capsule (30 mg total) by mouth at bedtime as needed for sleep.  Dispense: 90 capsule; Refill: 1  Controlled type 2 diabetes mellitus with diabetic neuropathy, without long-term current use of insulin  [facility]) Assessment & Plan: hgba1c to be checked , minimize simple carbs. Increase exercise as tolerated. Continue current meds    Hyperlipidemia associated with type 2 diabetes mellitus (HCC) Assessment & Plan: Tolerating statin, encouraged heart healthy diet, avoid trans fats, minimize simple carbs and saturated fats. Increase exercise as tolerated    Primary hypertension Assessment & Plan: Well controlled, no changes to meds. Encouraged heart healthy diet such as the DASH diet and exercise as tolerated.      Rheumatoid arthritis involving multiple sites with positive rheumatoid factor (HCC) Assessment & Plan: Per rheum   Other orders -     Diclofenac  Sodium; Apply 4 g topically 4 (four) times daily.  Dispense: 400 g; Refill: 6 -     Olmesartan  Medoxomil; Take 1 tablet (20 mg total) by mouth daily with supper.  Dispense: 90 tablet; Refill: 3   Assessment and Plan Assessment & Plan Postoperative wound infection, status post lipoma excision   She is experiencing a postoperative wound infection after a lipoma excision. The wound is wide open with missing sutures and persistent drainage. A previous wound culture showed a staphylococcus infection. She reports significant soreness and a hardened area around the wound. The wound is being packed daily, and she is completing a course of doxycycline , with one day remaining. Continue daily wound packing and complete the current course of doxycycline .   Jamee JONELLE Antonio Cyndee, DO

## 2024-04-17 NOTE — Assessment & Plan Note (Signed)
 Tolerating statin, encouraged heart healthy diet, avoid trans fats, minimize simple carbs and saturated fats. Increase exercise as tolerated

## 2024-04-17 NOTE — Assessment & Plan Note (Signed)
Per rheum 

## 2024-04-18 ENCOUNTER — Other Ambulatory Visit: Payer: Self-pay

## 2024-04-18 ENCOUNTER — Other Ambulatory Visit (HOSPITAL_BASED_OUTPATIENT_CLINIC_OR_DEPARTMENT_OTHER): Payer: Self-pay

## 2024-04-19 ENCOUNTER — Other Ambulatory Visit (HOSPITAL_COMMUNITY): Payer: Self-pay

## 2024-04-19 ENCOUNTER — Encounter: Payer: Self-pay | Admitting: Family Medicine

## 2024-04-19 ENCOUNTER — Other Ambulatory Visit (HOSPITAL_BASED_OUTPATIENT_CLINIC_OR_DEPARTMENT_OTHER): Payer: Self-pay

## 2024-04-19 ENCOUNTER — Telehealth: Payer: Self-pay | Admitting: Pharmacy Technician

## 2024-04-19 NOTE — Telephone Encounter (Addendum)
 Pharmacy Patient Advocate Encounter   Received notification from CoverMyMeds that prior authorization for UBRELVY  100MG  is required/requested.   Insurance verification completed.   The patient is insured through [facility].   Per test claim: The current 30 day co-pay is, $0.  No PA needed at this time. This test claim was processed through [facility]- copay amounts may vary at other pharmacies due to pharmacy/plan contracts, or as the patient moves through the different stages of their insurance plan.     KEY:[id number]

## 2024-04-21 ENCOUNTER — Other Ambulatory Visit: Payer: Self-pay | Admitting: Family Medicine

## 2024-04-21 DIAGNOSIS — T8149XA Infection following a procedure, other surgical site, initial encounter: Secondary | ICD-10-CM

## 2024-04-21 MED ORDER — LEVOFLOXACIN 500 MG PO TABS: 500.0000 mg | ORAL_TABLET | Freq: Every day | ORAL | 0 refills | Status: AC

## 2024-04-23 ENCOUNTER — Other Ambulatory Visit (HOSPITAL_BASED_OUTPATIENT_CLINIC_OR_DEPARTMENT_OTHER): Payer: Self-pay

## 2024-04-23 ENCOUNTER — Other Ambulatory Visit: Payer: Self-pay | Admitting: Family Medicine

## 2024-04-23 MED ORDER — CYCLOBENZAPRINE HCL 10 MG PO TABS
10.0000 mg | ORAL_TABLET | Freq: Every day | ORAL | 1 refills | Status: AC
  Filled 2024-04-23 – 2024-04-24 (×3): qty 90, 90d supply, fill #0
  Filled 2024-07-17: qty 90, 90d supply, fill #1

## 2024-04-24 ENCOUNTER — Other Ambulatory Visit (HOSPITAL_BASED_OUTPATIENT_CLINIC_OR_DEPARTMENT_OTHER): Payer: Self-pay

## 2024-04-24 ENCOUNTER — Encounter: Payer: Self-pay | Admitting: Family

## 2024-04-26 ENCOUNTER — Ambulatory Visit: Payer: Self-pay | Admitting: Family Medicine

## 2024-05-15 ENCOUNTER — Other Ambulatory Visit (HOSPITAL_BASED_OUTPATIENT_CLINIC_OR_DEPARTMENT_OTHER): Payer: Self-pay

## 2024-05-15 DIAGNOSIS — M0609 Rheumatoid arthritis without rheumatoid factor, multiple sites: Secondary | ICD-10-CM | POA: Diagnosis not present

## 2024-05-15 DIAGNOSIS — M2550 Pain in unspecified joint: Secondary | ICD-10-CM | POA: Diagnosis not present

## 2024-05-15 DIAGNOSIS — M79643 Pain in unspecified hand: Secondary | ICD-10-CM | POA: Diagnosis not present

## 2024-05-15 DIAGNOSIS — G8929 Other chronic pain: Secondary | ICD-10-CM | POA: Diagnosis not present

## 2024-05-15 DIAGNOSIS — M8580 Other specified disorders of bone density and structure, unspecified site: Secondary | ICD-10-CM | POA: Diagnosis not present

## 2024-05-15 DIAGNOSIS — M797 Fibromyalgia: Secondary | ICD-10-CM | POA: Diagnosis not present

## 2024-05-15 DIAGNOSIS — M25511 Pain in right shoulder: Secondary | ICD-10-CM | POA: Diagnosis not present

## 2024-05-15 DIAGNOSIS — M549 Dorsalgia, unspecified: Secondary | ICD-10-CM | POA: Diagnosis not present

## 2024-05-15 DIAGNOSIS — Z79899 Other long term (current) drug therapy: Secondary | ICD-10-CM | POA: Diagnosis not present

## 2024-05-15 DIAGNOSIS — M1990 Unspecified osteoarthritis, unspecified site: Secondary | ICD-10-CM | POA: Diagnosis not present

## 2024-05-15 MED ORDER — PREDNISONE 5 MG PO TABS
ORAL_TABLET | ORAL | 0 refills | Status: AC
  Filled 2024-05-15: qty 42, 12d supply, fill #0

## 2024-05-16 ENCOUNTER — Other Ambulatory Visit: Payer: Self-pay | Admitting: Family Medicine

## 2024-05-16 ENCOUNTER — Other Ambulatory Visit: Payer: Self-pay

## 2024-05-16 ENCOUNTER — Other Ambulatory Visit (HOSPITAL_BASED_OUTPATIENT_CLINIC_OR_DEPARTMENT_OTHER): Payer: Self-pay

## 2024-05-16 DIAGNOSIS — H2511 Age-related nuclear cataract, right eye: Secondary | ICD-10-CM | POA: Diagnosis not present

## 2024-05-16 DIAGNOSIS — H25812 Combined forms of age-related cataract, left eye: Secondary | ICD-10-CM | POA: Diagnosis not present

## 2024-05-16 DIAGNOSIS — E119 Type 2 diabetes mellitus without complications: Secondary | ICD-10-CM | POA: Diagnosis not present

## 2024-05-16 DIAGNOSIS — Z79899 Other long term (current) drug therapy: Secondary | ICD-10-CM | POA: Diagnosis not present

## 2024-05-16 DIAGNOSIS — E785 Hyperlipidemia, unspecified: Secondary | ICD-10-CM

## 2024-05-16 LAB — OPHTHALMOLOGY REPORT-SCANNED

## 2024-05-16 MED ORDER — ROSUVASTATIN CALCIUM 20 MG PO TABS
20.0000 mg | ORAL_TABLET | Freq: Every day | ORAL | 1 refills | Status: AC
  Filled 2024-05-16: qty 90, 90d supply, fill #0

## 2024-05-21 ENCOUNTER — Other Ambulatory Visit (HOSPITAL_BASED_OUTPATIENT_CLINIC_OR_DEPARTMENT_OTHER): Payer: Self-pay

## 2024-05-21 MED ORDER — HYDROXYCHLOROQUINE SULFATE 200 MG PO TABS
400.0000 mg | ORAL_TABLET | Freq: Every day | ORAL | 0 refills | Status: DC
  Filled 2024-05-21: qty 180, 90d supply, fill #0

## 2024-05-22 DIAGNOSIS — M75101 Unspecified rotator cuff tear or rupture of right shoulder, not specified as traumatic: Secondary | ICD-10-CM | POA: Diagnosis not present

## 2024-05-22 DIAGNOSIS — M25511 Pain in right shoulder: Secondary | ICD-10-CM | POA: Diagnosis not present

## 2024-05-22 DIAGNOSIS — M12811 Other specific arthropathies, not elsewhere classified, right shoulder: Secondary | ICD-10-CM | POA: Diagnosis not present

## 2024-05-23 ENCOUNTER — Other Ambulatory Visit (HOSPITAL_BASED_OUTPATIENT_CLINIC_OR_DEPARTMENT_OTHER): Payer: Self-pay

## 2024-05-23 MED ORDER — PREGABALIN 150 MG PO CAPS
150.0000 mg | ORAL_CAPSULE | Freq: Three times a day (TID) | ORAL | 0 refills | Status: DC
  Filled 2024-05-23: qty 270, 90d supply, fill #0

## 2024-05-28 ENCOUNTER — Other Ambulatory Visit (HOSPITAL_BASED_OUTPATIENT_CLINIC_OR_DEPARTMENT_OTHER): Payer: Self-pay

## 2024-05-28 DIAGNOSIS — M545 Low back pain, unspecified: Secondary | ICD-10-CM | POA: Diagnosis not present

## 2024-05-28 MED ORDER — PREDNISONE 10 MG (21) PO TBPK
ORAL_TABLET | ORAL | 0 refills | Status: DC
  Filled 2024-05-28: qty 21, 6d supply, fill #0

## 2024-05-30 ENCOUNTER — Other Ambulatory Visit (HOSPITAL_COMMUNITY): Payer: Self-pay

## 2024-05-30 ENCOUNTER — Other Ambulatory Visit (HOSPITAL_BASED_OUTPATIENT_CLINIC_OR_DEPARTMENT_OTHER): Payer: Self-pay

## 2024-05-30 MED ORDER — PREDNISONE 10 MG (21) PO TBPK
ORAL_TABLET | ORAL | 0 refills | Status: AC
  Filled 2024-05-30 – 2024-06-01 (×2): qty 21, 6d supply, fill #0
  Filled ????-??-??: fill #0

## 2024-06-01 ENCOUNTER — Other Ambulatory Visit (HOSPITAL_BASED_OUTPATIENT_CLINIC_OR_DEPARTMENT_OTHER): Payer: Self-pay

## 2024-06-01 NOTE — Telephone Encounter (Signed)
 Patient fell last friday and was seen by specialist this past week; x-rays showed no damage to hardware and no fractures; was given injection; pain is worse today and affects her walking; she has taken all pain meds she has and is on prednisone ; advised ED for pain control  Reason for Disposition . [1] SEVERE back pain (e.g., excruciating, unable to do any normal activities) AND [2] not improved 2 hours after pain medicine  Protocols used: Back Pain-A-AH

## 2024-06-04 ENCOUNTER — Other Ambulatory Visit (HOSPITAL_BASED_OUTPATIENT_CLINIC_OR_DEPARTMENT_OTHER): Payer: Self-pay

## 2024-06-04 MED ORDER — HYDROCODONE-ACETAMINOPHEN 5-325 MG PO TABS
1.0000 | ORAL_TABLET | Freq: Three times a day (TID) | ORAL | 0 refills | Status: AC | PRN
  Filled 2024-06-04: qty 15, 5d supply, fill #0

## 2024-06-04 NOTE — Progress Notes (Signed)
 [facility] Orthopaedic Spine Surgery Outpatient Visit     [name] DOB: [birth year] MRN: [id number] 06/04/2024  Chief Complaint  Patient presents with  . Lower Back - Follow-up, Pain  . Follow-up    Increased lower back pain for 7days     HISTORY:  History of Present Illness: [name]  is a 68 y.o. female with h/o L4-S1 laminectomy and PSF/TLIF on 06/08/21 with Dr. Marlyce. States that her pre-op LE radicular pain remains completely resolved. Her R foot drop is significantly improved as well. She has a history of RA. She manages her chronic pain with lyrica  and flexeril  at night and robaxin  during the day. She also has tramadol  at home for prn use.   She presented last week due to increased back pain since sustaining a fall on Friday 05/25/24. States her shoe got caught in a branch on the ground and she fell on her bottom. She reported increased back pain across her lumbosacral region. At that visit, she denied any pain into BLE or any B/B dysfunction or saddle anesthesia. I prescribed her a pred pack and administered a toradol  inj. Unfortunately, her pain has persisted and she has developed pain into BLE. It has become unbearable. She is desperate for relief. She continues to wear her LSO and continues using a heating pad, and her usual medications, and warm showers to help ease the pain.   She continues to take Lyrica  150mg  Qhs, this is currently prescribed else where. Continues tramadol  PRN, flexeril  at night time and methocarbamol  during the day. Continues OTC calcium , vitamin d , multivitamin.  INJECTIONS Hip pain worse when walking has improved with IA injection with Dr. Tobie.    Reviewed and updated this visit by provider: Tobacco  Allergies  Meds  Problems  Med Hx  Surg Hx  Fam Hx  PDMP       Physical EXAM: VITALS: Ht 5' 10 (1.778 m)   Wt 160 lb (72.6 kg)   BMI 22.96 kg/m  BMI Readings from Last 1  Encounters:  06/04/24 22.96 kg/m   Spine Musculoskeletal Exam  Gait   Gait is normal.  Inspection   Leg length disparity: no discrepancy   Thoracolumbar   Erythema: mild   Swelling: none       Prior incision: midline lumbar  Palpation   Thoracolumbar   Tenderness: present     Diffuse: yes     Iliac crest: right and left     Iliac crest comment: L>R     PSIS: left     SI Joint: left     Greater trochanter: left   Right     Masses: none     Spasms: none     Crepitus: none     Muscle tone: normal   Left     Masses: none     Spasms: mild     Crepitus: none     Muscle tone: normal  Strength   Thoracolumbar      Right     Tibialis anterior: 5/5.      Tibialis posterior: 5/5.      Plantar flexion: 5/5.      Quadriceps: 5/5.      Hamstring: 5/5.      Hip abductors: 5/5.      Hip flexion: 5/5.      Hip adduction: 5/5.       Left     Tibialis anterior: 5/5.  Tibialis posterior: 5/5.      Plantar flexion: 5/5.      Quadriceps: 5/5.      Hamstring: 5/5.      Hip abductors: 4+/5. Hip abductors are affected by pain.      Hip flexion: 5/5.      Hip adduction: 4+/5. Hip adduction is affected by pain.    Sensory   Thoracolumbar     Right     Anterior thigh: normal     Medial thigh: normal     Posterior thigh: normal     Lateral thigh: normal     Anterior knee: normal     Anterior leg: normal     Left     Anterior thigh: normal     Medial thigh: normal     Posterior thigh: normal     Lateral thigh: decreased     Anterior knee: normal     Anterior leg: normal  Reflexes   Thoracolumbar reflexes are normal.   Right     Quadriceps: 2/4     Achilles: 2/4    Left     Quadriceps: 2/4     Achilles: 2/4  Special Tests   Thoracolumbar     Right     SLR: back pain     Left     SLR: back pain   IMAGING: Lumbar XR taken and reviewed today 05/28/24 showing L4-S1 PSF with Hardware intact and in place without subsidence, loosening, fractures, or other  complications. 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 No acute fractures or listhesis seen. There is some vertebral body wedging seen at T12.   Spine Surgical History: L4-S1 laminectomy and PSF/TLIF on 06/08/21 with Dr. Marlyce The patient has no red flag history indicative of complicated back pain.*   Problem LIst:   ICD-10-CM   1. Sciatica, left side  M54.32 HYDROcodone -acetaminophen  (NORCO) 5-325 mg per tablet    ketoROLAC  tromethamine  (TORADOL ) injection 30 mg    2. Sciatica, right side  M54.31 HYDROcodone -acetaminophen  (NORCO) 5-325 mg per tablet    ketoROLAC  tromethamine  (TORADOL ) injection 30 mg        Assessment/Plan: At this point we discussed things at length. She sustained a significant fall and aggravated her lumbar spine. Reassured her that her x-rays did not show any acute fractures. She did have some vertebral body wedging at T12 but this area was not producing any pain on physical exam. It was all lumbosacral. Unfortunately, the pred pack did not provide lasting relief. In fact, her pain has now worsened with reports of new radiating pain into her legs. Associated with N/T. She already has an order for a lumbar mri to assess for a herniated disc and neural compromise. It is scheduled for Monday Dec 1. Plan for her to keep her f/u with Mallie as scheduled to review the mri. I will discontinue the tramadol  and send in a new script for norco 5 #15.Continue Lyrica  and Flexeril  nightly, and Robaxin  during the day.   For acute pain relief, a Toradol  30mg  injection was administered in office; patient tolerated injection well. Injection side effects and expectations were discussed with the patient.   Questions were encouraged and answered. Patient was instructed to call with any new concerns or problems.  Follow up for with Mallie to review MRI.   No orders of the defined types were placed in this encounter.   On the day of the encounter, a total of 30 minutes were spent on this patient  encounter including history,  physical exam, review of historical information, review of imaging, assessment, plan, documentation.   Electronically signed by Elveria Hoit, PA on 06/04/2024 at 3:46 PM

## 2024-06-05 ENCOUNTER — Other Ambulatory Visit (HOSPITAL_BASED_OUTPATIENT_CLINIC_OR_DEPARTMENT_OTHER): Payer: Self-pay

## 2024-06-05 MED ORDER — HYDROCODONE-ACETAMINOPHEN 5-325 MG PO TABS
1.0000 | ORAL_TABLET | Freq: Three times a day (TID) | ORAL | 0 refills | Status: DC | PRN
  Filled 2024-06-08: qty 90, 30d supply, fill #0

## 2024-06-06 ENCOUNTER — Ambulatory Visit (HOSPITAL_COMMUNITY)
Admission: RE | Admit: 2024-06-06 | Discharge: 2024-06-06 | Disposition: A | Source: Ambulatory Visit | Attending: Orthopedic Surgery | Admitting: Orthopedic Surgery

## 2024-06-06 ENCOUNTER — Other Ambulatory Visit (HOSPITAL_COMMUNITY): Payer: Self-pay | Admitting: Orthopedic Surgery

## 2024-06-06 ENCOUNTER — Ambulatory Visit (HOSPITAL_COMMUNITY)
Admission: RE | Admit: 2024-06-06 | Discharge: 2024-06-06 | Disposition: A | Discharge status: Home / Self Care | Source: Ambulatory Visit | Attending: Orthopedic Surgery | Admitting: Orthopedic Surgery

## 2024-06-06 ENCOUNTER — Other Ambulatory Visit (HOSPITAL_BASED_OUTPATIENT_CLINIC_OR_DEPARTMENT_OTHER): Payer: Self-pay

## 2024-06-06 DIAGNOSIS — M19011 Primary osteoarthritis, right shoulder: Secondary | ICD-10-CM | POA: Diagnosis not present

## 2024-06-06 DIAGNOSIS — M7989 Other specified soft tissue disorders: Secondary | ICD-10-CM | POA: Diagnosis not present

## 2024-06-06 DIAGNOSIS — R52 Pain, unspecified: Secondary | ICD-10-CM | POA: Insufficient documentation

## 2024-06-06 DIAGNOSIS — M48061 Spinal stenosis, lumbar region without neurogenic claudication: Secondary | ICD-10-CM | POA: Diagnosis not present

## 2024-06-06 DIAGNOSIS — M75101 Unspecified rotator cuff tear or rupture of right shoulder, not specified as traumatic: Secondary | ICD-10-CM | POA: Diagnosis not present

## 2024-06-06 DIAGNOSIS — M545 Low back pain, unspecified: Secondary | ICD-10-CM | POA: Diagnosis not present

## 2024-06-06 DIAGNOSIS — Z981 Arthrodesis status: Secondary | ICD-10-CM | POA: Diagnosis not present

## 2024-06-06 DIAGNOSIS — M51369 Other intervertebral disc degeneration, lumbar region without mention of lumbar back pain or lower extremity pain: Secondary | ICD-10-CM | POA: Diagnosis not present

## 2024-06-06 DIAGNOSIS — M4319 Spondylolisthesis, multiple sites in spine: Secondary | ICD-10-CM | POA: Diagnosis not present

## 2024-06-06 DIAGNOSIS — S46811A Strain of other muscles, fascia and tendons at shoulder and upper arm level, right arm, initial encounter: Secondary | ICD-10-CM | POA: Diagnosis not present

## 2024-06-06 MED ORDER — CALCITONIN (SALMON) 200 UNIT/ACT NA SOLN
NASAL | 3 refills | Status: AC
  Filled 2024-06-06: qty 3.7, 30d supply, fill #0
  Filled 2024-07-01: qty 3.7, 30d supply, fill #1

## 2024-06-08 ENCOUNTER — Other Ambulatory Visit (HOSPITAL_BASED_OUTPATIENT_CLINIC_OR_DEPARTMENT_OTHER): Payer: Self-pay

## 2024-06-08 ENCOUNTER — Other Ambulatory Visit: Payer: Self-pay

## 2024-06-11 ENCOUNTER — Other Ambulatory Visit (HOSPITAL_BASED_OUTPATIENT_CLINIC_OR_DEPARTMENT_OTHER): Payer: Self-pay

## 2024-06-11 ENCOUNTER — Other Ambulatory Visit: Payer: Self-pay

## 2024-06-12 ENCOUNTER — Other Ambulatory Visit: Payer: Self-pay

## 2024-06-12 ENCOUNTER — Other Ambulatory Visit (HOSPITAL_BASED_OUTPATIENT_CLINIC_OR_DEPARTMENT_OTHER): Payer: Self-pay

## 2024-06-12 MED ORDER — OXYCODONE-ACETAMINOPHEN 5-325 MG PO TABS
1.0000 | ORAL_TABLET | Freq: Three times a day (TID) | ORAL | 0 refills | Status: AC | PRN
  Filled 2024-06-12: qty 90, 30d supply, fill #0

## 2024-06-13 DIAGNOSIS — M0609 Rheumatoid arthritis without rheumatoid factor, multiple sites: Secondary | ICD-10-CM | POA: Diagnosis not present

## 2024-06-15 ENCOUNTER — Other Ambulatory Visit (HOSPITAL_BASED_OUTPATIENT_CLINIC_OR_DEPARTMENT_OTHER): Payer: Self-pay

## 2024-06-15 MED ORDER — PREGABALIN 300 MG PO CAPS
300.0000 mg | ORAL_CAPSULE | Freq: Two times a day (BID) | ORAL | 2 refills | Status: AC
  Filled 2024-06-15: qty 60, 30d supply, fill #0
  Filled 2024-07-17: qty 60, 30d supply, fill #1

## 2024-06-21 DIAGNOSIS — R748 Abnormal levels of other serum enzymes: Secondary | ICD-10-CM | POA: Diagnosis not present

## 2024-06-27 DIAGNOSIS — M75101 Unspecified rotator cuff tear or rupture of right shoulder, not specified as traumatic: Secondary | ICD-10-CM | POA: Diagnosis not present

## 2024-06-27 DIAGNOSIS — M12811 Other specific arthropathies, not elsewhere classified, right shoulder: Secondary | ICD-10-CM | POA: Diagnosis not present

## 2024-06-27 DIAGNOSIS — M25511 Pain in right shoulder: Secondary | ICD-10-CM | POA: Diagnosis not present

## 2024-06-28 ENCOUNTER — Ambulatory Visit: Admitting: Podiatry

## 2024-07-01 ENCOUNTER — Other Ambulatory Visit (HOSPITAL_BASED_OUTPATIENT_CLINIC_OR_DEPARTMENT_OTHER): Payer: Self-pay

## 2024-07-02 ENCOUNTER — Other Ambulatory Visit: Payer: Self-pay

## 2024-07-06 ENCOUNTER — Encounter: Payer: Self-pay | Admitting: Cardiology

## 2024-07-06 DIAGNOSIS — T8149XA Infection following a procedure, other surgical site, initial encounter: Secondary | ICD-10-CM

## 2024-07-06 NOTE — Telephone Encounter (Signed)
 On my review, Cimzia  can cause peripheral edema.  If answer to all other questions is negative, it is likely that this is a side effect of Cimzia .  Patient should discuss with her prescribing provider.  Keep appointment with Dr. Anner on 07/27/2024 when this can be discussed further.  Thanks MJP

## 2024-07-16 ENCOUNTER — Other Ambulatory Visit (HOSPITAL_BASED_OUTPATIENT_CLINIC_OR_DEPARTMENT_OTHER): Payer: Self-pay

## 2024-07-17 ENCOUNTER — Other Ambulatory Visit: Payer: Self-pay

## 2024-07-17 ENCOUNTER — Other Ambulatory Visit (HOSPITAL_BASED_OUTPATIENT_CLINIC_OR_DEPARTMENT_OTHER): Payer: Self-pay

## 2024-07-20 ENCOUNTER — Other Ambulatory Visit (HOSPITAL_BASED_OUTPATIENT_CLINIC_OR_DEPARTMENT_OTHER): Payer: Self-pay

## 2024-07-25 ENCOUNTER — Encounter (HOSPITAL_BASED_OUTPATIENT_CLINIC_OR_DEPARTMENT_OTHER): Payer: Self-pay

## 2024-07-25 ENCOUNTER — Other Ambulatory Visit (HOSPITAL_BASED_OUTPATIENT_CLINIC_OR_DEPARTMENT_OTHER): Payer: Self-pay

## 2024-07-26 NOTE — Progress Notes (Signed)
 " Cardiology Office Note:  .   Date:  07/30/2024  ID:  [name], DOB Nov 29, 1955, MRN [id number] PCP: [name], Amy SAUNDERS, DO  [facility] Providers Cardiologist:  Amy Clay, MD     Chief Complaint  Patient presents with   Follow-up    Patient Profile: .     [name] is a 69 y.o. female with a PMH reviewed below who presents here for annual follow-up at the request of [name], Amy SAUNDERS, DO.  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  Doing pretty well.  Was on Mounjaro  with A1c down to 5.5.  She was taken by doing a pin ectomy but was concerned about healing with her diabetes and RA.  She was tried to get approval but was unsuccessful.  Other than some palpitations was doing well from a cardiac standpoint.  Activity limited by RA.  Some intermittent high blood pressures.  LDL was 38 on 20 mg rosuvastatin  and having switched from Ozempic  to Mounjaro .  Discussed importance of maintaining adequate hydration and being on GLP-1 agonist with hydralazine  and Benicar .  Otherwise BP control.  Using as needed Lasix .  I am wondering if the Lyrica  is causing lower leg swelling. I understand that it can cause that. I dont have any shortness of breath and can walk around without getting shortness of breath.  Attachments included!   [name], On my review, Lyrica  can cause peripheral edema.  If answer to all other questions is negative, it is likely that this is a side effect of Cimzia .  Patient should discuss with her prescribing provider.  Keep appointment with Dr. Clay on 07/27/2024 when this can be discussed further.   Thanks [name]      Subjective  Discussed the use of AI scribe software for clinical note transcription with the patient, who  gave verbal consent to proceed.  History of Present Illness [name] is a 69 year old female who presents for preoperative cardiac evaluation for right shoulder surgery.  On November 14th, she experienced a fall while getting up on the steps, landing on her buttocks. Initially, she did not seek immediate medical attention but decided to have an x-ray to check her back screws, which were intact. However, she began experiencing severe pain radiating from her buttocks down both legs, described as 'electricity' and consistent with sciatic nerve pain. An MRI revealed a fractured sacrum. She was unable to drive or leave the house for six weeks, relying on family for assistance. The pain was exacerbated by sitting, and she could only lie on her left side.  She has a history of right shoulder pain, initially attributed to her rheumatoid and osteoarthritis. An MRI revealed a massive rotator cuff tear. The shoulder pain is significant as she is right-hand dominant and uses a walker. She reports that her orthopedic surgeon told her the ball in her shoulder has moved out of place and that there is inflammation in the clavicle area.  For her rheumatoid arthritis, she is on Cimzia  once a month. She also takes Lyrica  for nerve pain, initially at 150 mg three times a day, increased to 300 mg twice a day after the sacrum fracture. This higher dose caused swelling in her lower extremities, which resolved after  reducing the dose to 150 mg at night. She wears compression hose and takes furosemide  as needed for swelling.  Her current medications include Benicar  20 mg at supper, hydralazine  25 mg twice a day, Mounjaro  15 mg, rosuvastatin  40 mg, Cymbalta  twice a day, Flexeril  at bedtime, and Robaxin  as needed. She also takes Chlorcon 10 mg equivalent daily for potassium. She previously used Percocet for fracture pain but is no longer on it.  No shortness of breath, chest pain, or arrhythmias. Her blood pressure is  generally well-controlled, though it was elevated on the day of the visit due to exertion. She has not experienced any recent lightheadedness or dizziness. Her cholesterol levels are well-managed with her current statin therapy.    Objective   Active Medications[1] CV Medications: Lasix  20 mg daily; hydralazine  25 mg twice daily, Benicar  20 g daily; rosuvastatin  20 daily; Mounjaro  15 mg weekly  Studies Reviewed: [name]   EKG Interpretation Date/Time:  Friday July 27 2024 09:59:48 EST Ventricular Rate:  80 PR Interval:  156 QRS Duration:  130 QT Interval:  414 QTC Calculation: 477 R Axis:   39  Text Interpretation: Normal sinus rhythm Right bundle branch block When compared with ECG of 27-Jul-2023 09:13, Right bundle branch block has replaced Non-specific intra-ventricular conduction block Confirmed by [name] ([id number]) on 07/27/2024 10:15:15 AM    Labs from PCP 06/13/2024: WBC 7.8, H/H12.7/40.6; PLT 185.  Alk phos 398, AST 46, ALT 53 (high) TP mildly reduced at 5.7, albumin mildly reduced at 3.3. ** Echocardiogram (02/20/2020): Normal LV size and function.  EF 60 to 65%.  No RWMA.  Mild LVH.  G1 DD.  Normal strain.  Borderline elevated PAP but normal RV function.  Normal RAP.  AoV sclerosis but no stenosis. Event monitor January 2022: Normal sinus rhythm. Maximum. Average 78 bpm; 23 total events: There was 1 4 beat run of PVCs (VT)-not noted on monitor. All of the manually detected events (symptoms of dizziness or lightheadedness) were associated with sinus rhythm, sinus tachycardia [name] with PACs. Relatively normal monitor. Relatively normal monitor. Relatively normal monitor. No true sustained arrhythmias and SVT, A. fib, atrial flutter Carotid Dopplers (07/14/2021): Normal.  No significant stenosis.  1-39% bilateral carotids.  Otherwise normal vertebrals and subclavian's.   Risk Assessment/Calculations:             Physical Exam:   VS: Initial BP 144/75 mmHg -> recheck BP 136/72    Pulse 80   Ht 5' 9 (1.753 m)   Wt 159 lb 1.6 oz (72.2 kg)   SpO2 95%   BMI 23.49 kg/m    Wt Readings from Last 3 Encounters:  07/27/24 159 lb 1.6 oz (72.2 kg)  04/17/24 169 lb 12.8 oz (77 kg)  04/06/24 168 lb 6.4 oz (76.4 kg)    GEN: Well nourished, well groomed; in no acute distress; healthy. NECK: No JVD; No carotid bruits CARDIAC: Normal S1, S2; RRR, n 2/6 SEM at RUSB provides no additional murmurs, rubs, gallops RESPIRATORY:  Clear to auscultation without rales, wheezing or rhonchi ; nonlabored, good air movement. ABDOMEN: Soft, non-tender, non-distended EXTREMITIES:  No edema    ASSESSMENT AND PLAN: .   Senile calcific aortic valve sclerosis Mild aortic sclerosis without symptoms. Previous scan showed mild calcification without significant stenosis. - Continue to monitor aortic sclerosis with periodic evaluations -=> would be more concerned if murmur is louder. - Discuss potential need for future cardiac evaluation in one year.  Labile hypertension Blood pressure generally well controlled.  No recent orthostatic hypotension or dizziness. - Continue current antihypertensive regimen (Benicar  20 mg at supper, hydralazine  25 mg twice a day). -For now continue furosemide  20 mg daily. - Monitor blood pressure regularly, especially during exertion.  Orthostatic hypotension No recurrent issues. History adequate hydration. If she is symptomatic, would hold hydralazine   Hyperlipidemia associated with type 2 diabetes mellitus (HCC) Cholesterol levels well controlled on current statin therapy. - Continue current statin therapy (rosuvastatin  40 mg). - Follow up with primary care provider for routine cholesterol monitoring.  Continue Mounjaro  15 mg weekly for diabetes management.  Continue healthy diet and try to increase exercise.  Preoperative cardiovascular examination Preoperative cardiac evaluation for right rotator cuff repair. Low risk for cardiac complications during  surgery.  No CHF, no symptoms of CAD/angina, no arrhythmias, normal renal function, no stroke and not on insulin .  Vascular risk index would be LOW RISK (<1%) Cardiac clearance deemed appropriate. - Proceed with right rotator cuff repair surgery as planned. - Ensure preoperative clearance is communicated to the surgical team. - Continue current medications, except hold Benicar  the day of surgery.   Orders Placed This Encounter  Procedures   EKG 12-Lead         Follow-Up: Return in about 1 year (around 07/27/2025) for [employer].    Signed, Amy MICAEL Clay, MD, MS [name], M.D., M.S. Interventional Cardiologist  [facility] Pager # [phone]         [1]  Current Meds  Medication Sig   acetaminophen  (TYLENOL ) 650 MG CR tablet Take 650 mg by mouth as needed.   calcitonin, salmon, (MIACALCIN /FORTICAL) 200 UNIT/ACT nasal spray Instill one spray in nostril daily.  Alternate nostrils daily as directed.   Calcium  Carbonate-Vit D-Min (CALTRATE MINIS PLUS MINERALS) 300-800 MG-UNIT TABS Take 2 mg by mouth 2 (two) times daily.   certolizumab pegol  (CIMZIA , 2 SYRINGE,) 200 MG/ML prefilled syringe Inject 400 mg into the skin every 30 (thirty) days.   Cholecalciferol 50 MCG (2000 UT) TABS Take 2,000 Units by mouth daily.   conjugated estrogens  (PREMARIN ) vaginal cream Place 0.5 Applicatorfuls (1 gram) vaginally 3 (three) times a week.   Cyanocobalamin  3000 MCG SUBL Take 3,000 mcg by mouth daily.   cyclobenzaprine  (FLEXERIL ) 10 MG tablet Take 1 tablet (10 mg total) by mouth at bedtime.   diclofenac  Sodium (VOLTAREN ) 1 % GEL Apply 4 g topically 4 (four) times daily.   doxycycline  (VIBRA -TABS) 100 MG tablet Take 1 tablet (100 mg total) by mouth 2 (two) times daily.   DULoxetine  (CYMBALTA ) 30 MG capsule Take 1 capsule (30 mg total) by mouth 2 (two) times daily.   Emollient (DERMEND BRUISE FORMULA) CREA Apply 1 application  topically daily as needed  (Wound care).   estradiol  (DOTTI ) 0.1 MG/24HR patch Place 1 patch (0.1 mg total) onto the skin 2 (two) times a week.   furosemide  (LASIX ) 20 MG tablet Take 1 tablet (20 mg total) by mouth every morning.   glucose blood test strip Test blood sugar once daily. Dx Code: E11.9   HYDROcodone -acetaminophen  (NORCO/VICODIN) 5-325 MG tablet Take one tablet by mouth every 8 (eight) hours as needed for pain for up to 5 days. Max Daily Amount: 3 tablets   hydroxychloroquine  (PLAQUENIL ) 200 MG tablet Take 2 tablets (400 mg total) by mouth daily. (Patient taking differently: Take 200 mg by mouth 2 (two) times daily.)   loratadine (CLARITIN) 10 MG tablet Take 10 mg by mouth as needed (Before RA injection).   methocarbamol  (  ROBAXIN ) 500 MG tablet Take 1 tablet (500 mg total) by mouth 4 (four) times daily as needed. (Patient taking differently: Take 500 mg by mouth 4 (four) times daily as needed for muscle spasms.)   Multiple Vitamins-Minerals (CENTRUM ADULTS) TABS take 2 tablets by oral route  every day with food   mupirocin  ointment (BACTROBAN ) 2 % Apply to toes as needed. (Patient taking differently: Apply 1 Application topically daily as needed (Toe).)   olmesartan  (BENICAR ) 20 MG tablet Take 1 tablet (20 mg total) by mouth daily with supper.   ondansetron  (ZOFRAN ) 4 MG tablet Take 1 tablet (4 mg total) by mouth every 8 (eight) hours as needed for nausea or vomiting.   oxyCODONE -acetaminophen  (PERCOCET/ROXICET) 5-325 MG tablet Take 1 tablet by mouth every 8 (eight) hours as needed. Max Daily Amount: 3 tablets   pantoprazole  (PROTONIX ) 40 MG tablet Take 1 tablet (40 mg total) by mouth daily.   potassium chloride  (KLOR-CON  M) 10 MEQ tablet Take 1 tablet (10 mEq total) by mouth daily.   predniSONE  (STERAPRED UNI-PAK 21 TAB) 10 MG (21) TBPK tablet Take as directed per package intsructions.   pregabalin  (LYRICA ) 150 MG capsule Take 1 capsule (150 mg total) by mouth 3 (three) times daily. Max daily amount: 3 capsules  (450mg )   pregabalin  (LYRICA ) 300 MG capsule Take 1 capsule (300 mg total) by mouth 2 (two) times daily, Max Daily Amount: 600 mg   Rimegepant Sulfate  (NURTEC) 75 MG TBDP Take 1 tablet (75 mg total) by mouth every other day.   rosuvastatin  (CRESTOR ) 20 MG tablet Take 1 tablet (20 mg total) by mouth at bedtime.   temazepam  (RESTORIL ) 30 MG capsule Take 1 capsule (30 mg total) by mouth at bedtime as needed for sleep.   thiamine (VITAMIN B-1) 100 MG tablet Take 100 mg daily by mouth.   tirzepatide  (MOUNJARO ) 15 MG/0.5ML Pen Inject 15 mg into the skin once a week.   traMADol  (ULTRAM ) 50 MG tablet Take 1 tablet (50 mg total) by mouth 3 (three) times daily as needed. Max Daily Amount: 150 mg   triamcinolone  ointment (KENALOG ) 0.1 % APPLY A THIN LAYER TO THE AFFECTED AREA(S) BY TOPICAL ROUTE 2 TIMES PER DAY   Ubrogepant  (UBRELVY ) 100 MG TABS TAKE ONE TABLET BY MOUTH DAILY AS NEEDED FOR MIGRAINE. MAX 2 TABLETS IN 24 HOURS.   [DISCONTINUED] hydrALAZINE  (APRESOLINE ) 25 MG tablet Take 1 tablet (25 mg total) by mouth 2 (two) times daily. Hold if feeling dizzy or SBP<110 mmHg).  Can use additional 25 mg as needed for SBP>150 mmHg   "

## 2024-07-27 ENCOUNTER — Encounter: Payer: Self-pay | Admitting: Cardiology

## 2024-07-27 ENCOUNTER — Ambulatory Visit: Attending: Cardiology | Admitting: Cardiology

## 2024-07-27 ENCOUNTER — Other Ambulatory Visit (HOSPITAL_BASED_OUTPATIENT_CLINIC_OR_DEPARTMENT_OTHER): Payer: Self-pay

## 2024-07-27 VITALS — BP 136/72 | HR 80 | Ht 69.0 in | Wt 159.1 lb

## 2024-07-27 DIAGNOSIS — R0989 Other specified symptoms and signs involving the circulatory and respiratory systems: Secondary | ICD-10-CM

## 2024-07-27 DIAGNOSIS — E1169 Type 2 diabetes mellitus with other specified complication: Secondary | ICD-10-CM | POA: Diagnosis not present

## 2024-07-27 DIAGNOSIS — Z01810 Encounter for preprocedural cardiovascular examination: Secondary | ICD-10-CM | POA: Diagnosis not present

## 2024-07-27 DIAGNOSIS — R55 Syncope and collapse: Secondary | ICD-10-CM | POA: Diagnosis not present

## 2024-07-27 DIAGNOSIS — I951 Orthostatic hypotension: Secondary | ICD-10-CM

## 2024-07-27 DIAGNOSIS — I350 Nonrheumatic aortic (valve) stenosis: Secondary | ICD-10-CM | POA: Diagnosis not present

## 2024-07-27 DIAGNOSIS — I771 Stricture of artery: Secondary | ICD-10-CM | POA: Diagnosis not present

## 2024-07-27 DIAGNOSIS — E785 Hyperlipidemia, unspecified: Secondary | ICD-10-CM

## 2024-07-27 MED ORDER — HYDRALAZINE HCL 25 MG PO TABS
25.0000 mg | ORAL_TABLET | Freq: Two times a day (BID) | ORAL | 3 refills | Status: AC
  Filled 2024-07-27: qty 190, 95d supply, fill #0

## 2024-07-27 NOTE — Patient Instructions (Addendum)
 Medication Instructions:   NOT  NEEDED *If you need a refill on your cardiac medications before your next appointment, please call your pharmacy*   Lab Work: NO CHANGES     Testing/Procedures: NOT NEEDED   Follow-Up: At [facility], you and your health needs are our priority.  As part of our continuing mission to provide you with exceptional heart care, we have created designated Provider Care Teams.  These Care Teams include your primary Cardiologist (physician) and Advanced Practice Providers (APPs -  Physician Assistants and Nurse Practitioners) who all work together to provide you with the care you need, when you need it.     Your next appointment:   12 month(s)  The format for your next appointment:   In Person  Provider:   Alm Clay, MD   Other Instructions    YOU ARE CLEARED FROM A CARDIAC STANDPOINT TO PROCEED WITH ROTATOR CUFF SURGERY

## 2024-07-30 ENCOUNTER — Encounter: Payer: Self-pay | Admitting: Cardiology

## 2024-07-30 DIAGNOSIS — Z01810 Encounter for preprocedural cardiovascular examination: Secondary | ICD-10-CM | POA: Insufficient documentation

## 2024-07-30 NOTE — Assessment & Plan Note (Signed)
 No recurrent issues. History adequate hydration. If she is symptomatic, would hold hydralazine 

## 2024-07-30 NOTE — Assessment & Plan Note (Signed)
 Cholesterol levels well controlled on current statin therapy. - Continue current statin therapy (rosuvastatin  40 mg). - Follow up with primary care provider for routine cholesterol monitoring.  Continue Mounjaro  15 mg weekly for diabetes management.  Continue healthy diet and try to increase exercise.

## 2024-07-30 NOTE — Assessment & Plan Note (Signed)
 Preoperative cardiac evaluation for right rotator cuff repair. Low risk for cardiac complications during surgery.  No CHF, no symptoms of CAD/angina, no arrhythmias, normal renal function, no stroke and not on insulin .  Vascular risk index would be LOW RISK (<1%) Cardiac clearance deemed appropriate. - Proceed with right rotator cuff repair surgery as planned. - Ensure preoperative clearance is communicated to the surgical team. - Continue current medications, except hold Benicar  the day of surgery.

## 2024-07-30 NOTE — Assessment & Plan Note (Signed)
 Mild aortic sclerosis without symptoms. Previous scan showed mild calcification without significant stenosis. - Continue to monitor aortic sclerosis with periodic evaluations -=> would be more concerned if murmur is louder. - Discuss potential need for future cardiac evaluation in one year.

## 2024-07-30 NOTE — Assessment & Plan Note (Signed)
 Blood pressure generally well controlled. No recent orthostatic hypotension or dizziness. - Continue current antihypertensive regimen (Benicar  20 mg at supper, hydralazine  25 mg twice a day). -For now continue furosemide  20 mg daily. - Monitor blood pressure regularly, especially during exertion.

## 2024-07-31 ENCOUNTER — Other Ambulatory Visit (HOSPITAL_BASED_OUTPATIENT_CLINIC_OR_DEPARTMENT_OTHER): Payer: Self-pay

## 2024-07-31 MED ORDER — TRAMADOL HCL 50 MG PO TABS
ORAL_TABLET | ORAL | 1 refills | Status: AC
  Filled 2024-07-31: qty 90, 30d supply, fill #0

## 2024-08-15 ENCOUNTER — Other Ambulatory Visit (HOSPITAL_BASED_OUTPATIENT_CLINIC_OR_DEPARTMENT_OTHER): Payer: Self-pay

## 2024-08-15 DIAGNOSIS — Z1231 Encounter for screening mammogram for malignant neoplasm of breast: Secondary | ICD-10-CM

## 2024-08-15 MED ORDER — HYDROXYCHLOROQUINE SULFATE 200 MG PO TABS
400.0000 mg | ORAL_TABLET | Freq: Every day | ORAL | 0 refills | Status: AC
  Filled 2024-08-15: qty 180, 90d supply, fill #0

## 2024-08-23 ENCOUNTER — Ambulatory Visit (HOSPITAL_BASED_OUTPATIENT_CLINIC_OR_DEPARTMENT_OTHER)

## 2024-09-26 ENCOUNTER — Ambulatory Visit: Admitting: Podiatry

## 2024-10-16 ENCOUNTER — Ambulatory Visit: Admitting: Family Medicine

## 2024-10-22 ENCOUNTER — Ambulatory Visit: Admitting: Family Medicine

## 2024-11-13 ENCOUNTER — Ambulatory Visit: Admitting: Neurology

## 2025-01-01 ENCOUNTER — Ambulatory Visit: Admitting: Podiatry

## 2025-03-13 ENCOUNTER — Other Ambulatory Visit

## 2025-03-13 ENCOUNTER — Ambulatory Visit: Admitting: Family
